# Patient Record
Sex: Female | Born: 1960 | Race: Black or African American | Hispanic: No | Marital: Single | Smoking: Current every day smoker
Health system: Southern US, Community
[De-identification: ages and names within clinical notes are randomized; demographics above are authoritative.]

## PROBLEM LIST (undated history)

## (undated) ENCOUNTER — Emergency Department (HOSPITAL_BASED_OUTPATIENT_CLINIC_OR_DEPARTMENT_OTHER): Admission: EM | Discharge status: Against Medical Advice | Payer: Medicaid Other

## (undated) DIAGNOSIS — G8929 Other chronic pain: Secondary | ICD-10-CM

## (undated) DIAGNOSIS — Q782 Osteopetrosis: Secondary | ICD-10-CM

## (undated) DIAGNOSIS — F250 Schizoaffective disorder, bipolar type: Secondary | ICD-10-CM

## (undated) DIAGNOSIS — F29 Unspecified psychosis not due to a substance or known physiological condition: Secondary | ICD-10-CM

## (undated) DIAGNOSIS — D649 Anemia, unspecified: Secondary | ICD-10-CM

## (undated) DIAGNOSIS — M1990 Unspecified osteoarthritis, unspecified site: Secondary | ICD-10-CM

## (undated) DIAGNOSIS — Z765 Malingerer [conscious simulation]: Secondary | ICD-10-CM

## (undated) HISTORY — PX: TUBAL LIGATION: SHX77

## (undated) HISTORY — PX: MOUTH SURGERY: SHX715

---

## 2013-03-14 ENCOUNTER — Emergency Department (HOSPITAL_COMMUNITY)
Admission: EM | Admit: 2013-03-14 | Discharge: 2013-03-14 | Disposition: A | Discharge status: Home / Self Care | Payer: Medicaid Other | Attending: Emergency Medicine | Admitting: Emergency Medicine

## 2013-03-14 ENCOUNTER — Encounter (HOSPITAL_COMMUNITY): Payer: Self-pay | Admitting: Emergency Medicine

## 2013-03-14 DIAGNOSIS — F919 Conduct disorder, unspecified: Secondary | ICD-10-CM | POA: Insufficient documentation

## 2013-03-14 DIAGNOSIS — M25569 Pain in unspecified knee: Secondary | ICD-10-CM | POA: Insufficient documentation

## 2013-03-14 DIAGNOSIS — F17200 Nicotine dependence, unspecified, uncomplicated: Secondary | ICD-10-CM | POA: Insufficient documentation

## 2013-03-14 DIAGNOSIS — Z3202 Encounter for pregnancy test, result negative: Secondary | ICD-10-CM | POA: Insufficient documentation

## 2013-03-14 DIAGNOSIS — R4587 Impulsiveness: Secondary | ICD-10-CM | POA: Insufficient documentation

## 2013-03-14 DIAGNOSIS — IMO0002 Reserved for concepts with insufficient information to code with codable children: Secondary | ICD-10-CM | POA: Insufficient documentation

## 2013-03-14 LAB — RAPID URINE DRUG SCREEN, HOSP PERFORMED
Amphetamines: NOT DETECTED
Barbiturates: NOT DETECTED
Benzodiazepines: NOT DETECTED
Cocaine: NOT DETECTED
Tetrahydrocannabinol: NOT DETECTED

## 2013-03-14 LAB — POCT PREGNANCY, URINE: Preg Test, Ur: NEGATIVE

## 2013-03-14 MED ORDER — CEFPODOXIME PROXETIL 200 MG PO TABS
400.0000 mg | ORAL_TABLET | Freq: Once | ORAL | Status: AC
  Administered 2013-03-14: 400 mg via ORAL
  Filled 2013-03-14 (×2): qty 2

## 2013-03-14 MED ORDER — AZITHROMYCIN 1 G PO PACK
1.0000 g | PACK | Freq: Once | ORAL | Status: AC
  Administered 2013-03-14: 1 g via ORAL
  Filled 2013-03-14: qty 1

## 2013-03-14 MED ORDER — METRONIDAZOLE 500 MG PO TABS
2000.0000 mg | ORAL_TABLET | Freq: Once | ORAL | Status: AC
  Administered 2013-03-14: 2000 mg via ORAL
  Filled 2013-03-14: qty 4
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
## 2013-03-14 NOTE — ED Notes (Signed)
Notified provider drug screen is resulted provider at bedside.
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
## 2013-03-14 NOTE — ED Notes (Signed)
Nurse tech called lab for results on Urine Drug test. States should be resulted in 10 minutes.

## 2013-03-14 NOTE — ED Notes (Addendum)
Called pharmacy for status on medication to be delivered to [facility].  Will send to [facility] now.
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
## 2013-03-16 ENCOUNTER — Emergency Department (HOSPITAL_COMMUNITY)
Admission: EM | Admit: 2013-03-16 | Discharge: 2013-03-16 | Disposition: A | Discharge status: Home / Self Care | Payer: Medicaid Other | Attending: Emergency Medicine | Admitting: Emergency Medicine

## 2013-03-16 ENCOUNTER — Encounter (HOSPITAL_COMMUNITY): Payer: Self-pay | Admitting: Emergency Medicine

## 2013-03-16 ENCOUNTER — Emergency Department (HOSPITAL_COMMUNITY): Payer: Medicaid Other

## 2013-03-16 DIAGNOSIS — J069 Acute upper respiratory infection, unspecified: Secondary | ICD-10-CM | POA: Insufficient documentation

## 2013-03-16 DIAGNOSIS — IMO0001 Reserved for inherently not codable concepts without codable children: Secondary | ICD-10-CM | POA: Insufficient documentation

## 2013-03-16 DIAGNOSIS — M79609 Pain in unspecified limb: Secondary | ICD-10-CM | POA: Insufficient documentation

## 2013-03-16 DIAGNOSIS — F17200 Nicotine dependence, unspecified, uncomplicated: Secondary | ICD-10-CM | POA: Insufficient documentation

## 2013-03-16 DIAGNOSIS — R52 Pain, unspecified: Secondary | ICD-10-CM | POA: Insufficient documentation

## 2013-03-16 LAB — BASIC METABOLIC PANEL
Calcium: 9.2 mg/dL (ref 8.4–10.5)
Creatinine, Ser: 0.63 mg/dL (ref 0.50–1.10)
GFR calc Af Amer: >90 mL/min (ref >90)
GFR calc non Af Amer: >90 mL/min (ref >90)
Glucose, Bld: 125 mg/dL — ABNORMAL HIGH (ref 70–99)
Sodium: 139 mEq/L (ref 135–145)

## 2013-03-16 LAB — POCT I-STAT TROPONIN I: Troponin i, poc: 0 ng/mL (ref 0.00–0.08)

## 2013-03-16 LAB — CBC
MCH: 32.2 pg (ref 26.0–34.0)
MCHC: 34 g/dL (ref 30.0–36.0)
Platelets: 275 10*3/uL (ref 150–400)
RDW: 13.5 % (ref 11.5–15.5)

## 2013-03-16 MED ORDER — ACETAMINOPHEN 325 MG PO TABS
650.0000 mg | ORAL_TABLET | Freq: Once | ORAL | Status: AC
  Administered 2013-03-16: 650 mg via ORAL
  Filled 2013-03-16: qty 2

## 2013-03-16 NOTE — ED Notes (Signed)
Pt. Left ED when this nurse was in another patient room. Pt. Left both her discharge paperwork and resource guide in bed.

## 2013-03-16 NOTE — ED Notes (Signed)
Pt is here with feeling sob, body aches, legs hurts, and cough.  Pt denies chest pain

## 2013-03-16 NOTE — ED Notes (Signed)
Pt. Given 2 different resource guides to be discharged with, including a homeless shelter reference.

## 2013-03-16 NOTE — ED Provider Notes (Signed)
CSN: [id number]     Arrival date & time 03/16/13  1637 History  This chart was scribed for non-physician practitioner Felicie Morn, NP  working with Celene Kras, MD by Leone Payor, ED Scribe. This patient was seen in room TR10C/TR10C and the patient's care was started at 1637.     Chief Complaint  Patient presents with  . Generalized Body Aches  . Cough    The history is provided by the patient. No language interpreter was used.    HPI Comments: Betty Henderson is a 52 y.o. female who presents to the Emergency Department complaining of 1-2 days of gradual onset, gradually worsening, constant generalized body aches and cough. She also reports having bilateral leg pain that she has at baseline. She states the leg pain is usually worse with increased walking. She is here because she believes she has the flu. She is a daily smoker, last smoke was a few hours ago. She denies any other symptoms.    History reviewed. No pertinent past medical history. Past Surgical History  Procedure Laterality Date  . Mouth surgery     No family history on file. History  Substance Use Topics  . Smoking status: Current Every Day Smoker -- 1.00 packs/day    Types: Cigarettes  . Smokeless tobacco: Not on file  . Alcohol Use: No   OB History   Grav Para Term Preterm Abortions TAB SAB Ect Mult Living                 Review of Systems  HENT: Negative for sore throat.   Respiratory: Positive for cough.   Gastrointestinal: Negative for abdominal pain.  Musculoskeletal: Positive for arthralgias (bilateral leg pains) and myalgias.  All other systems reviewed and are negative.    Allergies  Other  Home Medications  No current outpatient prescriptions on file. BP 121/85  Pulse 91  Temp(Src) 98.6 F (37 C) (Oral)  Resp 22  SpO2 96% Physical Exam  Nursing note and vitals reviewed. Constitutional: She is oriented to person, place, and time. She appears well-developed and well-nourished.  HENT:   Head: Normocephalic and atraumatic.  Right Ear: External ear normal.  Left Ear: External ear normal.  Mouth/Throat: Oropharynx is clear and moist. No oropharyngeal exudate, posterior oropharyngeal edema, posterior oropharyngeal erythema or tonsillar abscesses.  Eyes: Pupils are equal, round, and reactive to light.  Cardiovascular: Normal rate, regular rhythm and normal heart sounds.   Pulmonary/Chest: Effort normal. No respiratory distress. She has no wheezes. She has rhonchi. She has no rales.  Abdominal: Soft. Bowel sounds are normal. She exhibits no distension. There is no tenderness.  Musculoskeletal: She exhibits no tenderness.  Neurological: She is alert and oriented to person, place, and time.  Skin: Skin is warm and dry.  Psychiatric: She has a normal mood and affect.    ED Course  Procedures   DIAGNOSTIC STUDIES: Oxygen Saturation is 96% on RA, adequate by my interpretation.    COORDINATION OF CARE: 6:44 PM Discussed negative lab and imaging results. Discussed treatment plan with pt at bedside and pt agreed to plan.   Labs Review Labs Reviewed  BASIC METABOLIC PANEL - Abnormal; Notable for the following:    Glucose, Bld 125 (*)    All other components within normal limits  CBC  POCT I-STAT TROPONIN I   Imaging Review Dg Chest 2 View  03/16/2013   CLINICAL DATA:  Cough.  EXAM: CHEST  2 VIEW  COMPARISON:  None.  FINDINGS:  The heart size and mediastinal contours are within normal limits. Both lungs are clear. No pleural effusion or pneumothorax is noted. The visualized skeletal structures are unremarkable.  IMPRESSION: No active cardiopulmonary disease.   Electronically Signed   By: Roque Lias M.D.   On: 03/16/2013 18:09    EKG Interpretation   None      Lab and radiology results reviewed, shared with patient.  Local shelter resource information provided.  MDM  Viral URI.   I personally performed the services described in this documentation, which was scribed  in my presence. The recorded information has been reviewed and is accurate.    Jimmye Norman, NP 03/17/13 [phone]
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
## 2013-03-17 NOTE — ED Provider Notes (Signed)
Medical screening examination/treatment/procedure(s) were performed by non-physician practitioner and as supervising physician I was immediately available for consultation/collaboration.    Celene Kras, MD 03/17/13 [phone]

## 2013-03-17 NOTE — ED Provider Notes (Signed)
Medical screening examination/treatment/procedure(s) were performed by non-physician practitioner and as supervising physician I was immediately available for consultation/collaboration.  EKG Interpretation   None        Teon Hudnall R. Aala Ransom, MD 03/17/13 0014 

## 2013-03-21 ENCOUNTER — Encounter (HOSPITAL_COMMUNITY): Payer: Self-pay | Admitting: Emergency Medicine

## 2013-03-21 ENCOUNTER — Emergency Department (HOSPITAL_COMMUNITY)
Admission: EM | Admit: 2013-03-21 | Discharge: 2013-03-21 | Disposition: A | Discharge status: Home / Self Care | Payer: Medicaid Other | Attending: Emergency Medicine | Admitting: Emergency Medicine

## 2013-03-21 DIAGNOSIS — J069 Acute upper respiratory infection, unspecified: Secondary | ICD-10-CM | POA: Insufficient documentation

## 2013-03-21 DIAGNOSIS — R062 Wheezing: Secondary | ICD-10-CM | POA: Insufficient documentation

## 2013-03-21 DIAGNOSIS — F17200 Nicotine dependence, unspecified, uncomplicated: Secondary | ICD-10-CM | POA: Insufficient documentation

## 2013-03-21 DIAGNOSIS — Z5900 Homelessness unspecified: Secondary | ICD-10-CM | POA: Insufficient documentation

## 2013-03-21 MED ORDER — IBUPROFEN 200 MG PO TABS
600.0000 mg | ORAL_TABLET | Freq: Once | ORAL | Status: AC
  Administered 2013-03-21: 600 mg via ORAL
  Filled 2013-03-21 (×2): qty 1

## 2013-03-21 MED ORDER — IBUPROFEN 400 MG PO TABS: 400.0000 mg | ORAL_TABLET | Freq: Four times a day (QID) | ORAL | Status: DC | PRN

## 2013-03-21 MED ORDER — ALBUTEROL SULFATE HFA 108 (90 BASE) MCG/ACT IN AERS
2.0000 | INHALATION_SPRAY | Freq: Four times a day (QID) | RESPIRATORY_TRACT | Status: DC | PRN
  Administered 2013-03-21: 2 via RESPIRATORY_TRACT
  Filled 2013-03-21: qty 6.7

## 2013-03-21 NOTE — Discharge Instructions (Signed)

## 2013-03-21 NOTE — ED Provider Notes (Signed)
Medical screening examination/treatment/procedure(s) were performed by non-physician practitioner and as supervising physician I was immediately available for consultation/collaboration.     Danise Dehne, MD 03/21/13 1451 

## 2013-03-21 NOTE — ED Notes (Signed)
Pt seen multiple times in past for same; pt c/o body aches but denies fever
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
## 2014-09-03 DIAGNOSIS — F200 Paranoid schizophrenia: Secondary | ICD-10-CM | POA: Insufficient documentation

## 2014-11-18 DIAGNOSIS — S93402A Sprain of unspecified ligament of left ankle, initial encounter: Secondary | ICD-10-CM | POA: Insufficient documentation

## 2014-11-18 DIAGNOSIS — S82832A Other fracture of upper and lower end of left fibula, initial encounter for closed fracture: Secondary | ICD-10-CM | POA: Insufficient documentation

## 2015-04-03 ENCOUNTER — Emergency Department (HOSPITAL_BASED_OUTPATIENT_CLINIC_OR_DEPARTMENT_OTHER)
Admission: EM | Admit: 2015-04-03 | Discharge: 2015-04-03 | Disposition: A | Discharge status: Home / Self Care | Payer: Medicaid Other | Attending: Emergency Medicine | Admitting: Emergency Medicine

## 2015-04-03 ENCOUNTER — Encounter (HOSPITAL_BASED_OUTPATIENT_CLINIC_OR_DEPARTMENT_OTHER): Payer: Self-pay | Admitting: *Deleted

## 2015-04-03 DIAGNOSIS — W000XXA Fall on same level due to ice and snow, initial encounter: Secondary | ICD-10-CM | POA: Insufficient documentation

## 2015-04-03 DIAGNOSIS — Y9389 Activity, other specified: Secondary | ICD-10-CM | POA: Diagnosis not present

## 2015-04-03 DIAGNOSIS — F17210 Nicotine dependence, cigarettes, uncomplicated: Secondary | ICD-10-CM | POA: Diagnosis not present

## 2015-04-03 DIAGNOSIS — T148XXA Other injury of unspecified body region, initial encounter: Secondary | ICD-10-CM

## 2015-04-03 DIAGNOSIS — S46911A Strain of unspecified muscle, fascia and tendon at shoulder and upper arm level, right arm, initial encounter: Secondary | ICD-10-CM | POA: Insufficient documentation

## 2015-04-03 DIAGNOSIS — Y998 Other external cause status: Secondary | ICD-10-CM | POA: Insufficient documentation

## 2015-04-03 DIAGNOSIS — Y92009 Unspecified place in unspecified non-institutional (private) residence as the place of occurrence of the external cause: Secondary | ICD-10-CM | POA: Insufficient documentation

## 2015-04-03 DIAGNOSIS — S4991XA Unspecified injury of right shoulder and upper arm, initial encounter: Secondary | ICD-10-CM | POA: Diagnosis present

## 2015-04-03 MED ORDER — IBUPROFEN 800 MG PO TABS
800.0000 mg | ORAL_TABLET | Freq: Once | ORAL | Status: AC
  Administered 2015-04-03: 800 mg via ORAL
  Filled 2015-04-03: qty 1
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
## 2015-04-03 NOTE — ED Notes (Signed)
Pt states she will call her sister for a ride home. Pt assisted to dress and put on her shoes, pt is talking rapidly, cursing.

## 2015-04-03 NOTE — ED Provider Notes (Signed)
CSN: [id number]     Arrival date & time 04/03/15  5284 History   First MD Initiated Contact with Patient 04/03/15 [id number]     Chief Complaint  Patient presents with  . Fall   (Consider location/radiation/quality/duration/timing/severity/associated sxs/prior Treatment) HPI 55 y.o. female with no sig pmh, presents to the Emergency Department today complaining of a fall. Pt was ambulating to get breakfast when she slipped on the ice and fell backwards. She struck her head. No LOC. Bystanders witnessed the fall and notified EMS. Pt is not on any blood thinners. No H/A, changes in vision, N/V/D, chest pain, abdominal pain, numbness/tingling.     History reviewed. No pertinent past medical history. Past Surgical History  Procedure Laterality Date  . Mouth surgery     History reviewed. No pertinent family history. Social History  Substance Use Topics  . Smoking status: Current Every Day Smoker -- 1.00 packs/day    Types: Cigarettes  . Smokeless tobacco: None  . Alcohol Use: No   OB History    No data available     Review of Systems  Constitutional: Negative for fever, chills, diaphoresis and fatigue.  HENT: Negative for congestion, sinus pressure, sore throat and tinnitus.   Eyes: Negative for visual disturbance.  Respiratory: Negative for cough and shortness of breath.   Cardiovascular: Negative for chest pain.  Gastrointestinal: Negative for nausea, vomiting, abdominal pain, diarrhea and constipation.  Endocrine: Negative for cold intolerance and heat intolerance.  Genitourinary: Negative for dysuria and hematuria.  Musculoskeletal: Negative for back pain.  Skin: Negative for color change.  Neurological: Negative for dizziness, syncope, weakness, numbness and headaches.   Allergies  Other  Home Medications   Prior to Admission medications   Medication Sig Start Date End Date Taking? Authorizing Provider  ibuprofen (ADVIL,MOTRIN) 400 MG tablet Take 1 tablet (400 mg total) by  mouth every 6 (six) hours as needed. 03/21/13   Tiffany Neva Seat, PA-C   BP 136/77 mmHg  Pulse 65  Temp(Src) 98.2 F (36.8 C) (Oral)  Resp 18  Ht 5\' 6"  (1.676 m)  Wt 56.7 kg  BMI 20.19 kg/m2  SpO2 100%   Physical Exam  Constitutional: She is oriented to person, place, and time. Vital signs are normal. She appears well-developed and well-nourished.  HENT:  Head: Normocephalic and atraumatic.  Right Ear: Hearing, tympanic membrane, external ear and ear canal normal.  Left Ear: Hearing, tympanic membrane, external ear and ear canal normal.  Nose: Nose normal.  Mouth/Throat: Uvula is midline, oropharynx is clear and moist and mucous membranes are normal.  Eyes: Conjunctivae, EOM and lids are normal. Pupils are equal, round, and reactive to light.  Neck: Trachea normal and normal range of motion. Neck supple.  Cardiovascular: Normal rate, regular rhythm, S1 normal, S2 normal, normal heart sounds, intact distal pulses and normal pulses.   Pulmonary/Chest: Effort normal and breath sounds normal.  Abdominal: Soft. Normal appearance and bowel sounds are normal. There is no tenderness.  Musculoskeletal:       Cervical back: Normal.       Thoracic back: Normal.       Lumbar back: Normal.  Tenderness along R Trapezius   Lymphadenopathy:    She has no cervical adenopathy.  Neurological: She is alert and oriented to person, place, and time. She has normal strength. No cranial nerve deficit or sensory deficit.  Skin: Skin is warm and dry.  Psychiatric: She has a normal mood and affect. Her speech is normal and behavior is  normal. Thought content normal.  Vitals reviewed.  ED Course  Procedures (including critical care time) Labs Review Labs Reviewed - No data to display  Imaging Review No results found. I have personally reviewed and evaluated these images and lab results as part of my medical decision-making.   EKG Interpretation None     MDM  I have reviewed the relevant previous  healthcare records. I have reviewed EMS Documentation. I obtained HPI from historian. Patient discussed with supervising physician  ED Course: C-Spine was cleared clinically: -She is alert and oriented with GCS of 15 -She is not intoxicated -She does not have a significant distracting injury -She is not high risk (age 58>65 y.o. Or dangerous mechanism or paresthesias in extremities).  -Full range of motion of extremities is sage to assess due to low risk. -She does not have midline cervical spine tenderness -The patient is able to actively rotate their neck 45 degrees left and right. No acute focal neurological deficit is present.   Patient has no of the following indications for head CT based on [place] CT head rule:  -GCS<15 two hours after injury -Suspected open or depressed skull fracture -Signs of basilar skull fracture (raccoon eyes, Battle's sign, otorrhea/rhinorrhea c/w CSF leak) -2+ episodes of vomiting -Age > 1865  -Amnesia before impact of > 30 minutes -Severe mechanism.   Assessment: [age]y F with no sig ph presents with a fall. Based on exam, I do not suspect any type of intercranial abnormalities. CN intact, no sensory/motor deficits. Cspine was cleared based on Nexus criteria. Pain is not on spinous process process and is more so on R trapezius. I will treat with ibuprofen and dc patient home with strict return precautions. Have her follow up with PCP for symptom management.     Patient is in no acute distress. Vital Signs are stable. Patient is able to ambulate.  Disposition/Plan:  DC Home Additional Verbal discharge instructions given and discussed with patient.  Pt Instructed to f/u with PCP in the next 48 hours for evaluation and treatment of symptoms. Return precautions given Pt acknowledges and agrees with plan   Supervising Physician Azalia BilisKevin Campos, MD   Final diagnoses:  Muscle strain     Audry Piliyler Angellee Cohill, PA-C 04/03/15 0804  Audry Piliyler Achaia Garlock, PA-C 04/03/15  [id number]  Azalia BilisKevin Campos, MD 04/03/15 [phone]

## 2015-04-03 NOTE — ED Notes (Signed)
MD at bedside. 

## 2015-04-19 ENCOUNTER — Emergency Department (HOSPITAL_BASED_OUTPATIENT_CLINIC_OR_DEPARTMENT_OTHER)
Admission: EM | Admit: 2015-04-19 | Discharge: 2015-04-19 | Discharge status: Against Medical Advice | Payer: Medicaid Other | Attending: Emergency Medicine | Admitting: Emergency Medicine

## 2015-04-19 DIAGNOSIS — M542 Cervicalgia: Secondary | ICD-10-CM | POA: Diagnosis present

## 2015-04-19 DIAGNOSIS — F17210 Nicotine dependence, cigarettes, uncomplicated: Secondary | ICD-10-CM | POA: Diagnosis not present

## 2015-04-19 DIAGNOSIS — M79606 Pain in leg, unspecified: Secondary | ICD-10-CM | POA: Diagnosis not present

## 2015-04-19 NOTE — ED Notes (Signed)
Per EMS pt having neck and leg pain for 3 years.

## 2015-04-19 NOTE — ED Notes (Signed)
Pt yelling at staff and EMS, pt states she is leaving. Upon asking past hx and why she is here.

## 2015-11-02 ENCOUNTER — Encounter (HOSPITAL_COMMUNITY): Payer: Self-pay | Admitting: *Deleted

## 2015-11-02 ENCOUNTER — Emergency Department (HOSPITAL_COMMUNITY)
Admission: EM | Admit: 2015-11-02 | Discharge: 2015-11-02 | Disposition: A | Discharge status: Home / Self Care | Payer: Medicaid Other | Attending: Emergency Medicine | Admitting: Emergency Medicine

## 2015-11-02 DIAGNOSIS — M79604 Pain in right leg: Secondary | ICD-10-CM | POA: Insufficient documentation

## 2015-11-02 DIAGNOSIS — F17210 Nicotine dependence, cigarettes, uncomplicated: Secondary | ICD-10-CM | POA: Insufficient documentation

## 2015-11-02 DIAGNOSIS — M79605 Pain in left leg: Secondary | ICD-10-CM | POA: Insufficient documentation

## 2015-11-02 MED ORDER — ACETAMINOPHEN 325 MG PO TABS
650.0000 mg | ORAL_TABLET | Freq: Once | ORAL | Status: AC
  Administered 2015-11-02: 650 mg via ORAL
  Filled 2015-11-02: qty 2

## 2015-11-02 MED ORDER — IBUPROFEN 200 MG PO TABS
400.0000 mg | ORAL_TABLET | Freq: Once | ORAL | Status: AC
  Administered 2015-11-02: 400 mg via ORAL
  Filled 2015-11-02: qty 2

## 2015-11-02 NOTE — ED Triage Notes (Signed)
Pt arrives to the ER via EMS for complaints of leg / thigh pain x 5 years; pt states that she has been seen multiple times at multiple different hospitals and states "I don't ever get anything but a pill" pt states that she was advised that she needed a brace for her legs; pt states that she walks a lot and her legs "give out" at times; pt ambulatory to triage from the EMS bay and ambulatory to triage 8

## 2015-11-02 NOTE — ED Provider Notes (Signed)
[facility]-EMERGENCY DEPT Provider Note   CSN: [id number] Arrival date & time: 11/02/15  0222  First Provider Contact:  None       History   Chief Complaint Chief Complaint  Patient presents with  . Leg Pain    HPI Betty Henderson is a 55 y.o. female.  HPI   Here today for leg problems, reports bilateral leg pain for 4-5 years.  Has been in hospital multiple times and give motrin and tylenol and sent home and reports that helps.  Denies recent trauma.  Taking some goody powders which help.  No fever.  Pain is moderate. Located bilateral thighs. No abd or back pain. Pt poor historian, unclear difference today that brought pt to ED, however she has had multiple presentations in care everywhere similar.    History reviewed. No pertinent past medical history.  There are no active problems to display for this patient.   Past Surgical History:  Procedure Laterality Date  . MOUTH SURGERY    . TUBAL LIGATION      OB History    No data available       Home Medications    Prior to Admission medications   Medication Sig Start Date End Date Taking? Authorizing Provider  ibuprofen (ADVIL,MOTRIN) 400 MG tablet Take 1 tablet (400 mg total) by mouth every 6 (six) hours as needed. 03/21/13   Marlon Peliffany Greene, PA-C    Family History No family history on file.  Social History Social History  Substance Use Topics  . Smoking status: Current Every Day Smoker    Packs/day: 1.00    Types: Cigarettes  . Smokeless tobacco: Never Used  . Alcohol use No     Allergies   Other   Review of Systems Review of Systems  Constitutional: Negative for fever.  HENT: Negative for sore throat.   Eyes: Negative for visual disturbance.  Respiratory: Negative for cough and shortness of breath.   Cardiovascular: Negative for chest pain.  Gastrointestinal: Negative for abdominal pain.  Genitourinary: Negative for difficulty urinating and vaginal bleeding (resolved yesterday but did have it  (post menopause)).  Musculoskeletal: Positive for arthralgias. Negative for back pain and neck pain.  Skin: Negative for rash.  Neurological: Negative for syncope and headaches.     Physical Exam Updated Vital Signs BP 101/62   Pulse (!) 55   Temp 97.5 F (36.4 C) (Oral)   Resp 17   SpO2 100%   Physical Exam  Constitutional: She is oriented to person, place, and time. She appears well-developed and well-nourished. No distress.  HENT:  Head: Normocephalic and atraumatic.  Appears disheveled  Eyes: Conjunctivae and EOM are normal.  Neck: Normal range of motion.  Cardiovascular: Normal rate, regular rhythm, normal heart sounds and intact distal pulses.  Exam reveals no gallop and no friction rub.   No murmur heard. Pulmonary/Chest: Effort normal and breath sounds normal. No respiratory distress. She has no wheezes. She has no rales.  Abdominal: Soft. She exhibits no distension. There is no tenderness. There is no guarding.  Musculoskeletal: She exhibits no edema or tenderness (reports bilateral thigh tenderness).  Neurological: She is alert and oriented to person, place, and time.  Skin: Skin is warm and dry. No rash noted. She is not diaphoretic. No erythema.  Nursing note and vitals reviewed.    ED Treatments / Results  Labs (all labs ordered are listed, but only abnormal results are displayed) Labs Reviewed - No data to display  EKG  EKG  Interpretation None       Radiology No results found.  Procedures Procedures (including critical care time)  Medications Ordered in ED Medications  acetaminophen (TYLENOL) tablet 650 mg (650 mg Oral Given 11/02/15 0513)  ibuprofen (ADVIL,MOTRIN) tablet 400 mg (400 mg Oral Given 11/02/15 0513)     Initial Impression / Assessment and Plan / ED Course  I have reviewed the triage vital signs and the nursing notes.  Pertinent labs & imaging results that were available during my care of the patient were reviewed by me and  considered in my medical decision making (see chart for details).  Clinical Course    55yo female presents with concern for bilateral leg pain that she reports has been present for 5 years.  No sign of swelling, no erythema, no hx of trauma.normal ROM, no asymmetric pain, good bilateral pulses and doubt acute DVT, arterial occlusion, cellulitis, abscess, septic joint, fx or ligamentous injury. Pt with multiple similar prior presentations reviewed in care everywhere. No hx of hypokalemia. Given ibuprofen/tylenol and sandwich and recommend PCP follow up. Pt also reports post-menopausal bleeding recently (not current) and recommend OBGYN follow up.Patient discharged in stable condition with understanding of reasons to return.   Final Clinical Impressions(s) / ED Diagnoses   Final diagnoses:  Bilateral leg pain    New Prescriptions Discharge Medication List as of 11/02/2015  5:03 AM       Alvira Monday, MD 11/02/15 [phone]

## 2016-01-29 ENCOUNTER — Emergency Department (HOSPITAL_COMMUNITY)
Admission: EM | Admit: 2016-01-29 | Discharge: 2016-01-30 | Disposition: A | Discharge status: Home / Self Care | Payer: Medicaid Other | Attending: Emergency Medicine | Admitting: Emergency Medicine

## 2016-01-29 ENCOUNTER — Encounter (HOSPITAL_COMMUNITY): Payer: Self-pay | Admitting: Family Medicine

## 2016-01-29 DIAGNOSIS — F17210 Nicotine dependence, cigarettes, uncomplicated: Secondary | ICD-10-CM | POA: Insufficient documentation

## 2016-01-29 DIAGNOSIS — Y999 Unspecified external cause status: Secondary | ICD-10-CM | POA: Insufficient documentation

## 2016-01-29 DIAGNOSIS — M545 Low back pain: Secondary | ICD-10-CM

## 2016-01-29 DIAGNOSIS — Y929 Unspecified place or not applicable: Secondary | ICD-10-CM | POA: Insufficient documentation

## 2016-01-29 DIAGNOSIS — Y939 Activity, unspecified: Secondary | ICD-10-CM | POA: Insufficient documentation

## 2016-01-29 DIAGNOSIS — W1849XA Other slipping, tripping and stumbling without falling, initial encounter: Secondary | ICD-10-CM | POA: Insufficient documentation

## 2016-01-29 NOTE — ED Triage Notes (Signed)
Pt called from triage,. No answer

## 2016-01-29 NOTE — ED Triage Notes (Signed)
Patient reports she was throwing a fit while in hand cuffs earlier today and slipped. Pt is complaining of lower back pain. Denies numbness, tinglings, or loss of bowel/bladder.

## 2016-01-30 MED ORDER — IBUPROFEN 200 MG PO TABS: 600.0000 mg | ORAL_TABLET | Freq: Once | ORAL | Status: DC

## 2016-01-30 MED ORDER — CYCLOBENZAPRINE HCL 10 MG PO TABS: 5.0000 mg | ORAL_TABLET | Freq: Once | ORAL | Status: DC

## 2016-01-30 NOTE — ED Provider Notes (Signed)
[facility]-EMERGENCY DEPT Provider Note   CSN: [id number] Arrival date & time: 01/29/16  1954  By signing my name below, I, Betty Henderson, attest that this documentation has been prepared under the direction and in the presence of non-physician practitioner, Arvilla MeresAshley Mina Babula, PA-C  Electronically Signed: Levon HedgerElizabeth Henderson, Scribe. 01/30/2016. 12:49 AM.   History   Chief Complaint Chief Complaint  Patient presents with  . Back Pain   HPI Betty Henderson is a 55 y.o. female who presents to the Emergency Department complaining of sudden onset, improving low back pain onset tonight. She states she was "throwing a fit" while in handcuffs earlier and slipped. Pt rates the pain a 5/10 in severity. Pain is exacerbated by walking and is alleviated by laying down. No treatments tried PTA. Pt is able to ambulate, but states it is painful. No hx of cancer, IV drug use, chronic steroid use, or back surgeries. She notes associated neck pain, but denies any incontinence, abdominal pain, nausea, vomiting, fever, hematuria, dysuria, numbness or weakness.   The history is provided by the patient. No language interpreter was used.    History reviewed. No pertinent past medical history.  There are no active problems to display for this patient.   Past Surgical History:  Procedure Laterality Date  . MOUTH SURGERY    . TUBAL LIGATION      OB History    No data available       Home Medications    Prior to Admission medications   Medication Sig Start Date End Date Taking? Authorizing Provider  ibuprofen (ADVIL,MOTRIN) 400 MG tablet Take 1 tablet (400 mg total) by mouth every 6 (six) hours as needed. 03/21/13   Marlon Peliffany Greene, PA-C    Family History History reviewed. No pertinent family history.  Social History Social History  Substance Use Topics  . Smoking status: Current Every Day Smoker    Packs/day: 1.00    Types: Cigarettes  . Smokeless tobacco: Never Used  . Alcohol use No    Allergies     Other  Review of Systems Review of Systems  Constitutional: Negative for fever.  Gastrointestinal: Negative for abdominal pain, nausea and vomiting.  Genitourinary: Negative for dysuria and hematuria.  Musculoskeletal: Positive for back pain and neck pain.  Skin: Negative for rash.  Neurological: Negative for weakness and numbness.   Physical Exam Updated Vital Signs BP 106/61 (BP Location: Left Arm)   Pulse 71   Temp 98.1 F (36.7 C) (Oral)   Resp 18   Wt 54.1 kg   SpO2 98%   BMI 19.26 kg/m   Physical Exam  Constitutional: She appears well-developed and well-nourished. No distress.  HENT:  Head: Normocephalic and atraumatic.  Mouth/Throat: Oropharynx is clear and moist. No oropharyngeal exudate.  Eyes: Conjunctivae and EOM are normal. Pupils are equal, round, and reactive to light. Right eye exhibits no discharge. Left eye exhibits no discharge. No scleral icterus.  Neck: Normal range of motion and phonation normal. Neck supple. No neck rigidity. Normal range of motion present.  Mild TTP of cervical spine. No obvious deformities or step off. Neck ROM intact.   Cardiovascular: Normal rate, regular rhythm, normal heart sounds and intact distal pulses.   No murmur heard. Pulmonary/Chest: Effort normal and breath sounds normal. No stridor. No respiratory distress. She has no wheezes. She has no rales.  Abdominal: Soft. Bowel sounds are normal. She exhibits no distension. There is no tenderness. There is no rigidity, no rebound, no guarding and no  CVA tenderness.  Musculoskeletal: Normal range of motion.  No TTP of lumbar spine or lumbar paravertebral muscles. No obvious deformities or step off.   Neurological: She is alert. She is not disoriented. Coordination and gait normal. GCS eye subscore is 4. GCS verbal subscore is 5. GCS motor subscore is 6.  Mental Status:  Alert, thought content appropriate, able to give a coherent history. Speech fluent without evidence of aphasia.  Able to follow 2 step commands without difficulty.  Cranial Nerves:  II:  Peripheral visual fields grossly normal, pupils equal, round, reactive to light III,IV, VI: ptosis not present, extra-ocular motions intact bilaterally  V,VII: smile symmetric, facial light touch sensation equal VIII: hearing grossly normal to voice  X: uvula elevates symmetrically  XI: bilateral shoulder shrug symmetric and strong XII: midline tongue extension without fassiculations Motor:  Normal tone. 5/5 in upper and lower extremities bilaterally including strong and equal grip strength and dorsiflexion/plantar flexion Sensory: light touch normal in all extremities. DTRs: patellar 2+ symmetric b/l Cerebellar: normal finger-to-nose with bilateral upper extremities Gait: normal gait and balance CV: distal pulses palpable throughout   Skin: Skin is warm and dry. She is not diaphoretic.  Psychiatric: She has a normal mood and affect. Her behavior is normal.   ED Treatments / Results  DIAGNOSTIC STUDIES:  Oxygen Saturation is 98% on RA, normal by my interpretation.    COORDINATION OF CARE:  12:48 AM Discussed treatment plan with pt at bedside and pt agreed to plan.  Labs (all labs ordered are listed, but only abnormal results are displayed) Labs Reviewed - No data to display  EKG  EKG Interpretation None       Radiology No results found.  Procedures Procedures (including critical care time)  Medications Ordered in ED Medications  ibuprofen (ADVIL,MOTRIN) tablet 600 mg (not administered)  cyclobenzaprine (FLEXERIL) tablet 5 mg (not administered)     Initial Impression / Assessment and Plan / ED Course  I have reviewed the triage vital signs and the nursing notes.  Pertinent labs & imaging results that were available during my care of the patient were reviewed by me and considered in my medical decision making (see chart for details).  Clinical Course     Patient presents to ED with  complaint of low back pain onset tonight. Patient is afebrile and non-toxic appearing in NAD. VSS. Patient with back pain. TTP of cervical spine without obvious deformity, neck ROM intact. No midline lumbar spinal tenderness or TTP of lumbar paravertebral muscles. No neurological deficits and normal neuro exam.  Patient can walk but states is painful.  No loss of bowel or bladder control.  No concern for cauda equina.  No fever, night sweats, weight loss, h/o cancer, IVDU. Given age will x-ray neck and lumbar spine to r/o fracture or sublucation. Pain medication ordered.  1:30am: Patient has eloped prior to imaging and medication administration.    Final Clinical Impressions(s) / ED Diagnoses   Final diagnoses:  Acute low back pain, unspecified back pain laterality, with sciatica presence unspecified    New Prescriptions Discharge Medication List as of 01/30/2016  1:24 AM    I personally performed the services described in this documentation, which was scribed in my presence. The recorded information has been reviewed and is accurate.    Lona KettleAshley Laurel Domique Reardon, [place]PA-C 01/30/16 [id number]    Zadie Rhineonald Wickline, MD 01/30/16 [phone]

## 2016-01-30 NOTE — ED Notes (Signed)
Second attempt made to speak with Betty Henderson related to illness and need for medical care she continues to refuse conversation and remained mute as she walked out of the ED

## 2016-04-18 ENCOUNTER — Encounter (HOSPITAL_COMMUNITY): Payer: Self-pay

## 2016-04-18 ENCOUNTER — Emergency Department (HOSPITAL_COMMUNITY): Payer: Medicaid Other

## 2016-04-18 ENCOUNTER — Emergency Department (HOSPITAL_COMMUNITY)
Admission: EM | Admit: 2016-04-18 | Discharge: 2016-04-19 | Disposition: A | Discharge status: Other Acute Inpatient Hospital | Payer: Medicaid Other | Attending: Emergency Medicine | Admitting: Emergency Medicine

## 2016-04-18 DIAGNOSIS — F17210 Nicotine dependence, cigarettes, uncomplicated: Secondary | ICD-10-CM | POA: Insufficient documentation

## 2016-04-18 DIAGNOSIS — F23 Brief psychotic disorder: Secondary | ICD-10-CM | POA: Insufficient documentation

## 2016-04-18 DIAGNOSIS — Z79899 Other long term (current) drug therapy: Secondary | ICD-10-CM | POA: Insufficient documentation

## 2016-04-18 LAB — COMPREHENSIVE METABOLIC PANEL
ALT: 12 U/L — AB (ref 14–54)
AST: 20 U/L (ref 15–41)
Albumin: 4.6 g/dL (ref 3.5–5.0)
Alkaline Phosphatase: 58 U/L (ref 38–126)
Anion gap: 12 (ref 5–15)
BUN: 12 mg/dL (ref 6–20)
CHLORIDE: 103 mmol/L (ref 101–111)
CO2: 24 mmol/L (ref 22–32)
CREATININE: 0.8 mg/dL (ref 0.44–1.00)
Calcium: 10.1 mg/dL (ref 8.9–10.3)
GFR calc Af Amer: >60 mL/min (ref >60)
GFR calc non Af Amer: >60 mL/min (ref >60)
Glucose, Bld: 114 mg/dL — ABNORMAL HIGH (ref 65–99)
Potassium: 4.1 mmol/L (ref 3.5–5.1)
SODIUM: 139 mmol/L (ref 135–145)
Total Bilirubin: 1 mg/dL (ref 0.3–1.2)
Total Protein: 7.4 g/dL (ref 6.5–8.1)

## 2016-04-18 LAB — CBC
HEMATOCRIT: 35.9 % — AB (ref 36.0–46.0)
Hemoglobin: 11.7 g/dL — ABNORMAL LOW (ref 12.0–15.0)
MCH: 29.9 pg (ref 26.0–34.0)
MCHC: 32.6 g/dL (ref 30.0–36.0)
MCV: 91.8 fL (ref 78.0–100.0)
Platelets: 234 10*3/uL (ref 150–400)
RBC: 3.91 MIL/uL (ref 3.87–5.11)
RDW: 12.8 % (ref 11.5–15.5)
WBC: 9 10*3/uL (ref 4.0–10.5)

## 2016-04-18 LAB — ETHANOL: Alcohol, Ethyl (B): <5 mg/dL (ref <5)

## 2016-04-18 MED ORDER — STERILE WATER FOR INJECTION IJ SOLN
INTRAMUSCULAR | Status: AC
  Filled 2016-04-18: qty 10

## 2016-04-18 MED ORDER — LORAZEPAM 1 MG PO TABS: 1.0000 mg | ORAL_TABLET | Freq: Four times a day (QID) | ORAL | Status: DC | PRN

## 2016-04-18 MED ORDER — ZIPRASIDONE MESYLATE 20 MG IM SOLR
10.0000 mg | Freq: Once | INTRAMUSCULAR | Status: AC
  Administered 2016-04-18: 10 mg via INTRAMUSCULAR
  Filled 2016-04-18: qty 20

## 2016-04-18 NOTE — ED Notes (Signed)
TTS called to advised she is unable to assess pt; RN at at room to check on pt; Pt has gotten dressed and states she would like to leave; RN ask pt to stay in room and she will bring paper work to sign out; RN walks to [employer] stations but still has pt in sight as pt is standing in hall way; pt exit the room going opposite way; RN tries to stop pt by asking if she can wait til papers are brought to the room pt states again she would like to leave; RN watched pt leave exit as there was no paper work in place at this time to hold pt; RN notified Md;

## 2016-04-18 NOTE — ED Notes (Signed)
When pt asked why she was being seen in ED, she stated "bleeding ulcers."  Reports she had someone bring her to [place] [facility] than [facility].  During our conversation, pt told me she was [place] in [place] [employer].  States she just got out of "the box" in [place].  Tells me her father was a Psychologist, occupationalsecret agent and that she, herself was in the [employer] and [employer].  States she is getting a job at [employer].  She likes [place] better than [employer] or [employer].  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

## 2016-04-18 NOTE — ED Provider Notes (Signed)
TIME SEEN: 4:35 AM  CHIEF COMPLAINT: "Spitting up blood"  HPI: Patient is a 56 year old female with unknown past medical history who presents to the emergency department stating that she is "spitting up blood" for the past several days. She denies that she is coughing it up or vomiting. Denies bloody stools or melena. Denies being on antiplatelets or anticoagulants. States she was having upper abdominal pain that has now resolved. Denies history of endoscopy or colonoscopy. Patient states that she thinks she has had ulcers before. When asked why she states "I just worked myself up as a teenager". Has never seen a gastroenterologist. Denies alcohol or NSAID use. Denies any pain currently. States she has not had an episode of spitting up blood and over 24 hours.   On evaluation, patient appears to be arguing with someone who is not present in the room. She is yelling and cursing. Told nursing staff that "I am the [place] of the universe". Has also told me that she is in the witness protection program.  ROS: Level V caveat for psychosis  PAST MEDICAL HISTORY/PAST SURGICAL HISTORY:  History reviewed. No pertinent past medical history.  MEDICATIONS:  Prior to Admission medications   Medication Sig Start Date End Date Taking? Authorizing Provider  ibuprofen (ADVIL,MOTRIN) 400 MG tablet Take 1 tablet (400 mg total) by mouth every 6 (six) hours as needed. 03/21/13   Marlon Pel, PA-C    ALLERGIES:  Allergies  Allergen Reactions  . Other     PT reports having environmental allergies which she used to receive shots for, but cannot specify exact allergens    SOCIAL HISTORY:  Social History  Substance Use Topics  . Smoking status: Current Every Day Smoker    Packs/day: 1.00    Types: Cigarettes  . Smokeless tobacco: Never Used  . Alcohol use No    FAMILY HISTORY: History reviewed. No pertinent family history.  EXAM: BP 134/87 (BP Location: Right Arm)   Pulse 64   Temp 97.8 F (36.6  C) (Oral)   Resp 16   SpO2 100%  CONSTITUTIONAL: Alert and oriented But does not respond appropriately to questions. Appears very agitated. Chronically ill-appearing but in no distress. HEAD: Normocephalic EYES: Conjunctivae clear, PERRL, EOMI ENT: normal nose; no rhinorrhea; moist mucous membranes, poor dentition, no bleeding from her gums, no blood in her mouth NECK: Supple, no meningismus, no nuchal rigidity, no LAD  CARD: RRR; S1 and S2 appreciated; no murmurs, no clicks, no rubs, no gallops RESP: Normal chest excursion without splinting or tachypnea; breath sounds clear and equal bilaterally; no wheezes, no rhonchi, no rales, no hypoxia or respiratory distress, speaking full sentences ABD/GI: Normal bowel sounds; non-distended; soft, non-tender, no rebound, no guarding, no peritoneal signs, no hepatosplenomegaly BACK:  The back appears normal and is non-tender to palpation, there is no CVA tenderness EXT: Normal ROM in all joints; non-tender to palpation; no edema; normal capillary refill; no cyanosis, no calf tenderness or swelling    SKIN: Normal color for age and race; warm; no rash NEURO: Moves all extremities equally PSYCH:  Patient denies SI or HI. She appears actively psychotic. Are you with someone in the room who is not present.  MEDICAL DECISION MAKING: Patient here with complaints of spitting up blood. On evaluation of patient's previous notes it appears she has been a [facility] for the same with negative workup. She denies that she is coughing or vomiting and states her last episode was over 24 hours ago. Exam  is completely benign other than patient appearing very agitated and obviously hallucinating. When I ask her if she is ever been admitted to a psychiatric facility before she states "I've been at [facility] long time [facility]". She denies that she is on any psychiatric medications. States she takes no medications at all. When I ask her if she feel she would benefit from  talking to psychiatry team she agrees and states she will stay voluntarily.  Will consult TTS.  ED PROGRESS:   5:20 AM  Pt had agreed to stay voluntarily. I felt she was acutely psychotic and that is why placed her on a psychiatric hold and I had immediately asked for nurse to get a sitter after I left the room and that patient could not leave but given she was voluntary at this time she did not need IVC. I was just informed by nursing staff the patient walked out of the emergency department. I was not told that she was leaving until she was already gone from the department. We will attempt to have police search for patient as I'm concerned she is psychotic and needs psychiatric evaluation and possible admission.  6:00 AM  Security was able to find patient at the bus stop and bring her back to the emergency room. IVC paperwork has been completed. TTS has been consulted. At this time she is medically stable and awaiting their evaluation.   I reviewed all nursing notes, vitals, pertinent old records, EKGs, labs, imaging (as available).    Layla MawKristen N Ward, DO 04/18/16 [phone]

## 2016-04-18 NOTE — ED Triage Notes (Signed)
Pt here for " coughing up blood " and sts may have " busted in her stomach " pt reports abd pain. Pt speaks of being a [place] of over the universe, sts she was in protective witness  Box For three months and just got out, obvious flight of ideas noted.

## 2016-04-18 NOTE — ED Notes (Signed)
Breakfast tray ordered 

## 2016-04-18 NOTE — ED Notes (Signed)
During TTS pt became agitated and left. MD was notified after pt had left by primary RN. This writer walked to bus stop with [employer] and found pt. 24 hour hold was filled by MD and IVC paper work as well. Between myself and [employer] pt was spoken to and aggreed to come back inside.

## 2016-04-18 NOTE — BH Assessment (Signed)
Under Review: [place], [address], [place], [address], [address] [address], [place], [facility], [place], [facility], [facility], [place]

## 2016-04-18 NOTE — BH Assessment (Addendum)
Tele Assessment Note   Betty Henderson is an 56 y.o. female, who per ED RN, Claybon Jabs, "Pt here for " coughing up blood " and sts may have " busted in her stomach " pt reports abd pain. Pt speaks of being a [place] of over the universe, sts she was in protective witness box for three months and just got out, obvious flight of ideas noted". Pt was originally unabale to be seen for assessement due to mental status and agitation. During this assessment, pt was cooperative, but an unreliable historian due to mental status. She thought it was New year's day due to someone telling her "Happy new year" earlier. Pt denies SI, HI, AVH. Pt is unable to give past history, but does mention getting treatment in the past [facility], but says something about not being able to fill her prescriptions.  Pt says she lives in an apartment.  Pt has poor insight and judgment. Legal history is unknown, but she mentions something about being upset that she could not get her money at the police station. ? Pt denies alcohol/ substance abuse. ? MSE: Pt is casually dressed, alert, oriented to person, place with pressured speech and restless motor behavior. Eye contact is fair. Pt's mood is irritable. Affect is congruent with mood. Thought process is tangential. There is evidence that pt is currently responding to internal stimuli and experiencing delusional thought content. Pt was cooperative throughout assessment.    Elta Guadeloupe, NP, recommends inpatient psychiatric treatment.  [facility] has no appropriate beds. TTS to seek placement.        Diagnosis: Psychotic Disorder NOS  Past Medical History: History reviewed. No pertinent past medical history.  Past Surgical History:  Procedure Laterality Date  . MOUTH SURGERY    . TUBAL LIGATION      Family History: History reviewed. No pertinent family history.  Social History:  reports that she has been smoking Cigarettes.  She has been smoking about 1.00  pack per day. She has never used smokeless tobacco. She reports that she does not drink alcohol or use drugs.  Additional Social History:  Alcohol / Drug Use Pain Medications: UTA due to mental status Prescriptions: UTA due to mental status Over the Counter: UTA due to mental status History of alcohol / drug use?:  (UTA due to mental status) Longest period of sobriety (when/how long): UTA due to mental status Withdrawal Symptoms:  (UTA due to mental status)  CIWA: CIWA-Ar BP: (!) 114/48 Pulse Rate: (!) 58 COWS:    PATIENT STRENGTHS: (choose at least two) Capable of independent living Communication skills  Allergies:  Allergies  Allergen Reactions  . Other     PT reports having environmental allergies which she used to receive shots for, but cannot specify exact allergens    Home Medications:  (Not in a hospital admission)  OB/GYN Status:  No LMP recorded. Patient is postmenopausal.  General Assessment Data Location of Assessment: [facility] ED TTS Assessment: In system Is this a Tele or Face-to-Face Assessment?: Tele Assessment Is this an Initial Assessment or a Re-assessment for this encounter?: Initial Assessment Marital status:  (UTA due to mental status) Is patient pregnant?: No Pregnancy Status: No Living Arrangements: Other (Comment) (UTA due to mental status, but said she had an apartment) Can pt return to current living arrangement?: Yes Admission Status: Involuntary Is patient capable of signing voluntary admission?: Yes Referral Source: Self/Family/Friend Insurance type: MCD     Crisis Care Plan Living Arrangements: Other (  Comment) (UTA due to mental status, but said she had an apartment) Name of Psychiatrist: UTA due to mental status Name of Therapist: UTA due to mental status  Education Status Is patient currently in school?: No  Risk to self with the past 6 months Suicidal Ideation: No Has patient been a risk to self within the past 6 months prior to  admission? : No (UTA due to mental status) Suicidal Intent: No Has patient had any suicidal intent within the past 6 months prior to admission? :  (UTA due to mental status) Is patient at risk for suicide?:  (UTA due to mental status) Suicidal Plan?:  (UTA due to mental status) Has patient had any suicidal plan within the past 6 months prior to admission? :  (UTA due to mental status) Access to Means:  (UTA due to mental status) What has been your use of drugs/alcohol within the last 12 months?:  (UTA due to mental status) Previous Attempts/Gestures:  (UTA due to mental status) How many times?:  (UTA due to mental status) Other Self Harm Risks:  (UTA due to mental status) Triggers for Past Attempts:  (UTA due to mental status) Intentional Self Injurious Behavior:  (UTA due to mental status) Family Suicide History: Unable to assess (UTA due to mental status) Recent stressful life event(s): Legal Issues (UTA due to mental status, metntioned something about the pol) Persecutory voices/beliefs?: Yes Depression: No Depression Symptoms: Feeling angry/irritable Substance abuse history and/or treatment for substance abuse?:  (UTA due to mental status) Suicide prevention information given to non-admitted patients: Not applicable  Risk to Others within the past 6 months Homicidal Ideation: No Does patient have any lifetime risk of violence toward others beyond the six months prior to admission? : Unknown (mentioned being angry about not getting her money) Thoughts of Harm to Others:  (UTA due to mental status) Current Homicidal Intent:  (UTA due to mental status) Current Homicidal Plan:  (UTA due to mental status) Access to Homicidal Means:  (UTA due to mental status) History of harm to others?:  (UTA due to mental status) Assessment of Violence: None Noted Violent Behavior Description:  (uncooperative) Does patient have access to weapons?:  (UTA due to mental status) Criminal Charges Pending?:   (UTA due to mental status) Does patient have a court date:  (UTA due to mental status, mentioned the police) Is patient on probation?: Unknown  Psychosis Hallucinations:  (denies, but symptoms were observed) Delusions: Persecutory, Grandiose  Mental Status Report Appearance/Hygiene: In scrubs, Disheveled Eye Contact: Fair Motor Activity: Restlessness Speech: Argumentative, Pressured, Rapid, Incoherent, Loud, Tangential Level of Consciousness: Alert Mood: Irritable, Angry, Suspicious Affect: Apprehensive, Irritable Anxiety Level: Moderate Thought Processes: Tangential, Flight of Ideas Judgement: Impaired Orientation: Person, Place, Situation Obsessive Compulsive Thoughts/Behaviors: Minimal  Cognitive Functioning Concentration: Fair Memory: Recent Impaired, Remote Impaired IQ: Average Insight: Poor Impulse Control: Poor Appetite: Fair Weight Loss: 0 Weight Gain: 0 Sleep: Unable to Assess Total Hours of Sleep:  (UTA due to mental status) Vegetative Symptoms: Unable to Assess  ADLScreening [facility] Assessment Services) Patient's cognitive ability adequate to safely complete daily activities?: Yes Patient able to express need for assistance with ADLs?: Yes Independently performs ADLs?: Yes (appropriate for developmental age)  Prior Inpatient Therapy Prior Inpatient Therapy:  (UTA due to mental status, [facility];) Prior Therapy Facilty/Provider(s):  ([facility]?) Reason for Treatment:  (UTA due to mental status)  Prior Outpatient Therapy Prior Outpatient Therapy:  (UTA due to mental status-mentioned [facility]) Prior  Therapy Dates:  (UTA due to mental status) Prior Therapy Facilty/Provider(s):  (UTA due to mental status) Reason for Treatment:  (UTA due to mental status) Does patient have an ACCT team?: Unknown (UTA due to mental status) Does patient have Intensive In-House Services?  : Unknown (UTA due to mental status) Does patient have [facility]  services? : Unknown (UTA due to mental status) Does patient have P4CC services?: Unknown (UTA due to mental status)  ADL Screening (condition at time of admission) Patient's cognitive ability adequate to safely complete daily activities?: Yes Is the patient deaf or have difficulty hearing?: No Does the patient have difficulty seeing, even when wearing glasses/contacts?: No Does the patient have difficulty concentrating, remembering, or making decisions?: Yes Patient able to express need for assistance with ADLs?: Yes Does the patient have difficulty dressing or bathing?: No Independently performs ADLs?: Yes (appropriate for developmental age) Does the patient have difficulty walking or climbing stairs?: No Weakness of Legs: None Weakness of Arms/Hands: None  Home Assistive Devices/Equipment Home Assistive Devices/Equipment: None    Abuse/Neglect Assessment (Assessment to be complete while patient is alone) Physical Abuse:  (UTA due to mental status) Verbal Abuse:  (UTA due to mental status) Sexual Abuse:  (UTA due to mental status) Exploitation of patient/patient's resources:  (UTA due to mental status) Self-Neglect:  (UTA due to mental status) Values / Beliefs Cultural Requests During Hospitalization: None Spiritual Requests During Hospitalization: None   Advance Directives (For Healthcare) Does Patient Have a Medical Advance Directive?: No Would patient like information on creating a medical advance directive?: No - Patient declined    Additional Information 1:1 In Past 12 Months?:  (UTA due to mental status) CIRT Risk: Yes Elopement Risk: Yes Does patient have medical clearance?: Yes     Disposition:  Disposition Initial Assessment Completed for this Encounter: Yes Disposition of Patient: Inpatient treatment program Type of inpatient treatment program: Adult  [facility],Yoni Lobos Hines 04/18/2016 1:39 PM

## 2016-04-18 NOTE — ED Notes (Signed)
[facility]

## 2016-04-18 NOTE — ED Notes (Signed)
Lunch tray here 

## 2016-04-18 NOTE — ED Notes (Signed)
Pt alert and cooperative.  Moved to room T9, sitter with pt.  TTS notified pt now ready for TTS evaluation when they are.

## 2016-04-18 NOTE — ED Notes (Signed)
Purple scrubs given, patient was asked to change clothes.

## 2016-04-18 NOTE — ED Notes (Signed)
Spoke to pt regarding current needs.  Noticed she was watching a cooking show on tv. Asked pt if she liked to cook, pt stated yes, that she is a good cook. "Betty Henderson been trying to get me to do something for her for a long time"

## 2016-04-18 NOTE — BH Assessment (Signed)
Attempted assessment with pt but could not complete. Pt became very agitated, got up from hospital bed, put her clothes back on and walked out of room. Pt's nurse tried to redirect her back into her room but pt left the ED .  Therapist observed pt talking to an "imaginary friend" and mumbling words that were not understandable. Pt did not make good eye contact. Pt appeared to be responding to internal stimuli.   Orlie PollenAshley Karstyn Birkey, LPC, NCC,  LCAS-A Therapeutic Triage Specialist  04/18/2016 5:21 AM

## 2016-04-18 NOTE — ED Notes (Signed)
RN attempted to assess why pt was here; pt was talking to invisible person; pt was cursing at unknown person; Pt was unclear about reason for being here; RN will reassess with Md

## 2016-04-18 NOTE — ED Notes (Signed)
Pt unable to void at this time. 

## 2016-04-19 ENCOUNTER — Other Ambulatory Visit: Payer: Self-pay

## 2016-04-19 LAB — RAPID URINE DRUG SCREEN, HOSP PERFORMED
Amphetamines: NOT DETECTED
Barbiturates: NOT DETECTED
Benzodiazepines: NOT DETECTED
Cocaine: NOT DETECTED
Opiates: NOT DETECTED
Tetrahydrocannabinol: NOT DETECTED

## 2016-04-19 MED ORDER — DIPHENHYDRAMINE HCL 50 MG/ML IJ SOLN
25.0000 mg | Freq: Once | INTRAMUSCULAR | Status: AC
Start: 2016-04-19 — End: 2016-04-19
  Administered 2016-04-19: 25 mg via INTRAMUSCULAR
  Filled 2016-04-19: qty 1

## 2016-04-19 MED ORDER — STERILE WATER FOR INJECTION IJ SOLN
INTRAMUSCULAR | Status: AC
  Filled 2016-04-19: qty 10

## 2016-04-19 MED ORDER — ZIPRASIDONE MESYLATE 20 MG IM SOLR
20.0000 mg | Freq: Once | INTRAMUSCULAR | Status: AC
  Administered 2016-04-19: 20 mg via INTRAMUSCULAR
  Filled 2016-04-19: qty 20

## 2016-04-19 MED ORDER — LORAZEPAM 2 MG/ML IJ SOLN
2.0000 mg | Freq: Once | INTRAMUSCULAR | Status: AC
  Administered 2016-04-19: 2 mg via INTRAMUSCULAR
  Filled 2016-04-19: qty 1

## 2016-04-19 MED ORDER — STERILE WATER FOR INJECTION IJ SOLN
INTRAMUSCULAR | Status: AC
  Administered 2016-04-19: 3 mL
  Filled 2016-04-19: qty 10

## 2016-04-19 NOTE — BH Assessment (Signed)
Patient has been accepted to [facility].  Patient assigned to room 411-1 Accepting physician is Dr. Seth BakeLekwauwa Call report to [phone]  ER Staff, nurse Lowanda FosterBrittany

## 2016-04-19 NOTE — ED Provider Notes (Signed)
Patient seen for EMTALA. EMR reviewed. Patient alert and ambulatory, VS stable. Heart regular and lungs clear.Stable for transfer   Arby BarretteMarcy Burt Piatek, MD 04/19/16 [phone]

## 2016-04-19 NOTE — Progress Notes (Signed)
[name] at [facility] [phone]) called stating pt is accepted for admission and she will call back once bed has been assigned so that pt can be transferred.   Notified [facility].   Notified Pt's son [name] [phone].  [name], MSW, LCSW Clinical Social Work, Disposition  04/19/2016 [phone]

## 2016-04-19 NOTE — Progress Notes (Addendum)
Spoke with pt's son [phone] (emergency contact lists name Betty Henderson however he states his name is Betty Henderson). Son states he received call from pt yesterday after not hearing from her in 4 months, states she indicated she was going to a hospital but he could not extract details. States, "She has been in the hospital for mental health before, last time was a couple of years ago I think." States she is noncompliant with OP treatment and medications. Reports pt lives alone but he wonders if she is homeless at this point due to him not being able to determine precise situation. States he "hears different things out in the community about her." Reports pt has no close supports that he knows of, and it is not uncommon for him to not hear from pt for months. States, "psychiatric hospital is what she really needs." CSW explained psychiatry has recommended inpatient treatment for pt and that is being pursued. Son asks to be kept updated if she is transferred.  [name] called stating pt's referral is being reviewed- requested EKG. CSW requested EKG from ED and will fax once resulted.  Disposition: pending inpatient psychiatric admission Discussed with: pt's son and psychiatry team.  Ilean SkillMeghan Lucy Boardman, MSW, LCSW Clinical Social Work, Disposition  04/19/2016 [phone]

## 2016-04-19 NOTE — ED Notes (Signed)
Regular Diet was ordered for Patient for Lunch. 

## 2016-04-19 NOTE — ED Notes (Signed)
This RN called [facility] number to inquire about transport. Dispatch given name and number; will call back about status of pt's transport.

## 2016-04-19 NOTE — ED Notes (Signed)
0830 pt became increasingly aggressive in manner and speech after asking for cookies and  Being denied because it was not time. We then gave her cookies, and explained we needed EKG for [facility] to accept her. Pt still screaming and being more aggressive, security and [employer] at bedside  and in pod F, charge nurse called,  Dr Adela LankFloyd  Notified and meds ordered, pt pacing in room and cursing loudly and threatening aggressive behavior

## 2016-04-19 NOTE — ED Notes (Signed)
meds were given at [facility]0845

## 2016-04-19 NOTE — ED Notes (Signed)
[facility] called.  [facility] is [facility]. [facility]. Paperwork being prepared.

## 2016-04-19 NOTE — Progress Notes (Signed)
Faxed pt's EKG results and IVC to [facility] ([name]). [facility] and call back if able to accept pt today.  Ilean SkillMeghan Kathlen Sakurai, MSW, LCSW Clinical Social Work, Disposition  04/19/2016 [phone]

## 2016-04-20 NOTE — ED Notes (Signed)
After pt was told Simonne ComeLEO was here to transport [facility], she had an outburst over not wearing clothes to be transported in.  Pt shoved overbed table into wall, threw blankets on floor, and started yelling about lawyers and suing everybody.  In order to facilitate transport and safety for all involved, this RN allowed patient to get dressed in her clothing after first clearing it with the charge nurse, sheriff's deputy, and the accepting facility.

## 2016-05-04 DIAGNOSIS — R7989 Other specified abnormal findings of blood chemistry: Secondary | ICD-10-CM | POA: Insufficient documentation

## 2016-05-04 DIAGNOSIS — M1990 Unspecified osteoarthritis, unspecified site: Secondary | ICD-10-CM | POA: Insufficient documentation

## 2016-05-14 DIAGNOSIS — E119 Type 2 diabetes mellitus without complications: Secondary | ICD-10-CM | POA: Insufficient documentation

## 2016-11-23 ENCOUNTER — Emergency Department (HOSPITAL_COMMUNITY)
Admission: EM | Admit: 2016-11-23 | Discharge: 2016-11-24 | Disposition: A | Discharge status: Home / Self Care | Payer: Self-pay | Attending: Emergency Medicine | Admitting: Emergency Medicine

## 2016-11-23 ENCOUNTER — Encounter (HOSPITAL_COMMUNITY): Payer: Self-pay | Admitting: Nurse Practitioner

## 2016-11-23 DIAGNOSIS — M79604 Pain in right leg: Secondary | ICD-10-CM | POA: Insufficient documentation

## 2016-11-23 DIAGNOSIS — Z5321 Procedure and treatment not carried out due to patient leaving prior to being seen by health care provider: Secondary | ICD-10-CM | POA: Insufficient documentation

## 2016-11-23 DIAGNOSIS — M79605 Pain in left leg: Secondary | ICD-10-CM | POA: Insufficient documentation

## 2016-11-23 NOTE — ED Triage Notes (Signed)
Pt is presented by EMS, pt states she has been walking all day and both her legs and arms are hurting.

## 2016-11-24 NOTE — ED Notes (Signed)
Patient did not respond when called for V/S.  Patient did not notify staff when leaving facility.

## 2016-11-24 NOTE — ED Notes (Signed)
Called pt's name to update vitals, but no response from the waiting room.  

## 2016-11-26 ENCOUNTER — Ambulatory Visit (HOSPITAL_COMMUNITY)
Admission: EM | Admit: 2016-11-26 | Discharge: 2016-11-26 | Disposition: A | Discharge status: Home / Self Care | Payer: Self-pay | Attending: Family Medicine | Admitting: Family Medicine

## 2016-11-26 ENCOUNTER — Encounter (HOSPITAL_COMMUNITY): Payer: Self-pay | Admitting: *Deleted

## 2016-11-26 DIAGNOSIS — M25521 Pain in right elbow: Secondary | ICD-10-CM

## 2016-11-26 MED ORDER — IBUPROFEN 400 MG PO TABS
400.0000 mg | ORAL_TABLET | Freq: Four times a day (QID) | ORAL | 0 refills | Status: DC | PRN
Start: 2016-11-26 — End: 2016-11-26

## 2016-11-26 MED ORDER — IBUPROFEN 800 MG PO TABS
800.0000 mg | ORAL_TABLET | Freq: Once | ORAL | Status: AC
  Administered 2016-11-26: 800 mg via ORAL

## 2016-11-26 MED ORDER — IBUPROFEN 800 MG PO TABS
ORAL_TABLET | ORAL | Status: AC
  Filled 2016-11-26: qty 1

## 2016-11-26 MED ORDER — IBUPROFEN 400 MG PO TABS: 400.0000 mg | ORAL_TABLET | Freq: Four times a day (QID) | ORAL | 0 refills | Status: DC | PRN
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
## 2016-11-26 NOTE — Discharge Instructions (Signed)
Take ibuprofen for pain and inflammation. Ice/heat compress. You can use the ace bandage to help with support as per your request. Monitor for worsening of symptoms, changes in symptoms follow up as needed for reevaluation.

## 2016-11-26 NOTE — ED Triage Notes (Signed)
Pt  alledges    r  Arm  Pain      From   An  Event        That   Happened   About   1  Month  Ago        She  [facility]   [facility]   [facility]      She  Reports  Pain on  Movement   And  posistion

## 2016-11-28 ENCOUNTER — Encounter (HOSPITAL_COMMUNITY): Payer: Self-pay | Admitting: *Deleted

## 2016-11-28 ENCOUNTER — Emergency Department (HOSPITAL_COMMUNITY)
Admission: EM | Admit: 2016-11-28 | Discharge: 2016-11-28 | Disposition: A | Discharge status: Home / Self Care | Payer: Self-pay | Attending: Emergency Medicine | Admitting: Emergency Medicine

## 2016-11-28 DIAGNOSIS — Z5321 Procedure and treatment not carried out due to patient leaving prior to being seen by health care provider: Secondary | ICD-10-CM | POA: Insufficient documentation

## 2016-11-28 DIAGNOSIS — M79601 Pain in right arm: Secondary | ICD-10-CM | POA: Insufficient documentation

## 2016-11-28 DIAGNOSIS — R42 Dizziness and giddiness: Secondary | ICD-10-CM | POA: Insufficient documentation

## 2016-11-28 DIAGNOSIS — F17210 Nicotine dependence, cigarettes, uncomplicated: Secondary | ICD-10-CM | POA: Insufficient documentation

## 2016-11-28 LAB — BASIC METABOLIC PANEL
ANION GAP: 11 (ref 5–15)
BUN: 26 mg/dL — AB (ref 6–20)
CO2: 24 mmol/L (ref 22–32)
CREATININE: 1.42 mg/dL — AB (ref 0.44–1.00)
Calcium: 8.7 mg/dL — ABNORMAL LOW (ref 8.9–10.3)
Chloride: 96 mmol/L — ABNORMAL LOW (ref 101–111)
GFR calc Af Amer: 47 mL/min — ABNORMAL LOW (ref >60)
GFR calc non Af Amer: 40 mL/min — ABNORMAL LOW (ref >60)
Glucose, Bld: 120 mg/dL — ABNORMAL HIGH (ref 65–99)
Potassium: 3.6 mmol/L (ref 3.5–5.1)
SODIUM: 131 mmol/L — AB (ref 135–145)

## 2016-11-28 LAB — CBC
HCT: 34.8 % — ABNORMAL LOW (ref 36.0–46.0)
Hemoglobin: 11.4 g/dL — ABNORMAL LOW (ref 12.0–15.0)
MCH: 28.4 pg (ref 26.0–34.0)
MCHC: 32.8 g/dL (ref 30.0–36.0)
MCV: 86.8 fL (ref 78.0–100.0)
PLATELETS: 308 10*3/uL (ref 150–400)
RBC: 4.01 MIL/uL (ref 3.87–5.11)
RDW: 13.8 % (ref 11.5–15.5)
WBC: 8.8 10*3/uL (ref 4.0–10.5)

## 2016-11-28 LAB — CBG MONITORING, ED: Glucose-Capillary: 119 mg/dL — ABNORMAL HIGH (ref 65–99)

## 2016-11-28 NOTE — ED Notes (Signed)
No response from pt in lobby 

## 2016-11-28 NOTE — ED Triage Notes (Signed)
Pt arrived via [facility] with complaints of right arm pain and bandage to site.  Removed bandage and no injury noted.  Pt reports black outs or dizzy spells.  Pt rambling about jobs, homeless, going to jail.  Denies SI/HI.

## 2016-11-28 NOTE — ED Notes (Signed)
Patient called several times but no answer.

## 2016-11-29 ENCOUNTER — Encounter (HOSPITAL_COMMUNITY): Payer: Self-pay | Admitting: *Deleted

## 2016-11-29 ENCOUNTER — Emergency Department (HOSPITAL_COMMUNITY)
Admission: EM | Admit: 2016-11-29 | Discharge: 2016-11-29 | Disposition: A | Discharge status: Home / Self Care | Payer: Self-pay | Attending: Emergency Medicine | Admitting: Emergency Medicine

## 2016-11-29 DIAGNOSIS — M79601 Pain in right arm: Secondary | ICD-10-CM

## 2016-11-29 LAB — I-STAT CHEM 8, ED
BUN: 24 mg/dL — ABNORMAL HIGH (ref 6–20)
CHLORIDE: 96 mmol/L — AB (ref 101–111)
CREATININE: 1.3 mg/dL — AB (ref 0.44–1.00)
Calcium, Ion: 1.1 mmol/L — ABNORMAL LOW (ref 1.15–1.40)
GLUCOSE: 84 mg/dL (ref 65–99)
HCT: 33 % — ABNORMAL LOW (ref 36.0–46.0)
Hemoglobin: 11.2 g/dL — ABNORMAL LOW (ref 12.0–15.0)
POTASSIUM: 3.3 mmol/L — AB (ref 3.5–5.1)
Sodium: 134 mmol/L — ABNORMAL LOW (ref 135–145)
TCO2: 27 mmol/L (ref 22–32)

## 2016-11-29 MED ORDER — SODIUM CHLORIDE 0.9 % IV BOLUS (SEPSIS)
1000.0000 mL | Freq: Once | INTRAVENOUS | Status: AC
  Administered 2016-11-29: 1000 mL via INTRAVENOUS

## 2016-11-29 NOTE — Discharge Instructions (Signed)
Please follow up with your doctor.

## 2016-11-29 NOTE — ED Triage Notes (Signed)
Pt c/o swelling to R arm for 2 days; full ROM with swelling noted. Pt has flight of ideas at times. Denies SI/HI/hallucinations.

## 2016-11-29 NOTE — ED Provider Notes (Signed)
[facility]   CSN: [id number] Arrival date & time: 11/28/16  2320     History   Chief Complaint Chief Complaint  Patient presents with  . Arm Swelling    HPI Betty Henderson is a 56 y.o. female.  Patient presents to the emergency department with chief complaint of right arm pain. She reports a history of the same. She has been seen multiple times by several different providers for the same as well. She denies any known injuries. She states that her arm seems swollen. She denies any pain with movement. She denies any chest pain or shortness breath. Denies any nausea, vomiting, diarrhea. There are no other associated symptoms.   The history is provided by the patient. No language interpreter was used.    History reviewed. No pertinent past medical history.  There are no active problems to display for this patient.   Past Surgical History:  Procedure Laterality Date  . MOUTH SURGERY    . TUBAL LIGATION      OB History    No data available       Home Medications    Prior to Admission medications   Medication Sig Start Date End Date Taking? Authorizing Provider  ibuprofen (ADVIL,MOTRIN) 400 MG tablet Take 1 tablet (400 mg total) by mouth every 6 (six) hours as needed. 11/26/16   Belinda Fisher, PA-C    Family History No family history on file.  Social History Social History  Substance Use Topics  . Smoking status: Current Every Day Smoker    Packs/day: 1.00    Types: Cigarettes  . Smokeless tobacco: Never Used  . Alcohol use No     Allergies   Other   Review of Systems Review of Systems  All other systems reviewed and are negative.    Physical Exam Updated Vital Signs BP (!) 102/58   Pulse 78   Temp 98.4 F (36.9 C) (Oral)   Resp 16   SpO2 100%   Physical Exam Nursing note and vitals reviewed.  Constitutional: Pt appears well-developed and well-nourished. No distress.  HENT:  Head: Normocephalic and atraumatic.  Eyes:  Conjunctivae are normal.  Neck: Normal range of motion.  Cardiovascular: Normal rate, regular rhythm. Intact distal pulses.   Capillary refill < 3 sec.  Pulmonary/Chest: Effort normal and breath sounds normal.  Musculoskeletal:  Right upper extremity Pt exhibits no tenderness to palpation, range of motion strength is 5/5.    Neurological: Pt  is alert. Coordination normal.  Sensation: 5/5 Skin: Skin is warm and dry. Pt is not diaphoretic.  No evidence of open wound or skin tenting Psychiatric: Pt has a normal mood and affect.     ED Treatments / Results  Labs (all labs ordered are listed, but only abnormal results are displayed) Labs Reviewed  I-STAT CHEM 8, ED - Abnormal; Notable for the following:       Result Value   Sodium 134 (*)    Potassium 3.3 (*)    Chloride 96 (*)    BUN 24 (*)    Creatinine, Ser 1.30 (*)    Calcium, Ion 1.10 (*)    Hemoglobin 11.2 (*)    HCT 33.0 (*)    All other components within normal limits    EKG  EKG Interpretation None       Radiology No results found.  Procedures Procedures (including critical care time)  Medications Ordered in ED Medications - No data to display   Initial  Impression / Assessment and Plan / ED Course  I have reviewed the triage vital signs and the nursing notes.  Pertinent labs & imaging results that were available during my care of the patient were reviewed by me and considered in my medical decision making (see chart for details).     Patient with chronic right arm pain. She has strong pulses. There is no evidence of infection. No evidence of trauma. She moves the arm without any complaint.    Seems a little dry, Cr is up a little.  Has been lying and sleeping all night.  BP improves when sitting up and standing.   DC to home after finishing fluids.  Final Clinical Impressions(s) / ED Diagnoses   Final diagnoses:  Pain of right upper extremity    New Prescriptions New Prescriptions   No  medications on file     Roxy HorsemanBrowning, Sharel Behne, Cordelia Poche-C 11/29/16 [id number]    Zadie RhineWickline, Donald, MD 11/29/16 [phone]

## 2016-12-03 ENCOUNTER — Emergency Department (HOSPITAL_COMMUNITY)
Admission: EM | Admit: 2016-12-03 | Discharge: 2016-12-03 | Disposition: A | Discharge status: Home / Self Care | Payer: Self-pay | Attending: Emergency Medicine | Admitting: Emergency Medicine

## 2016-12-03 ENCOUNTER — Encounter (HOSPITAL_COMMUNITY): Payer: Self-pay

## 2016-12-03 DIAGNOSIS — R55 Syncope and collapse: Secondary | ICD-10-CM | POA: Insufficient documentation

## 2016-12-03 DIAGNOSIS — E860 Dehydration: Secondary | ICD-10-CM | POA: Insufficient documentation

## 2016-12-03 DIAGNOSIS — R112 Nausea with vomiting, unspecified: Secondary | ICD-10-CM | POA: Insufficient documentation

## 2016-12-03 DIAGNOSIS — K029 Dental caries, unspecified: Secondary | ICD-10-CM | POA: Insufficient documentation

## 2016-12-03 DIAGNOSIS — F17210 Nicotine dependence, cigarettes, uncomplicated: Secondary | ICD-10-CM | POA: Insufficient documentation

## 2016-12-03 LAB — CBC
HCT: 33.7 % — ABNORMAL LOW (ref 36.0–46.0)
HEMOGLOBIN: 11 g/dL — AB (ref 12.0–15.0)
MCH: 28.5 pg (ref 26.0–34.0)
MCHC: 32.6 g/dL (ref 30.0–36.0)
MCV: 87.3 fL (ref 78.0–100.0)
Platelets: 279 10*3/uL (ref 150–400)
RBC: 3.86 MIL/uL — ABNORMAL LOW (ref 3.87–5.11)
RDW: 14.5 % (ref 11.5–15.5)
WBC: 11 10*3/uL — AB (ref 4.0–10.5)

## 2016-12-03 LAB — URINALYSIS, ROUTINE W REFLEX MICROSCOPIC
BILIRUBIN URINE: NEGATIVE
GLUCOSE, UA: NEGATIVE mg/dL
HGB URINE DIPSTICK: NEGATIVE
Ketones, ur: NEGATIVE mg/dL
NITRITE: NEGATIVE
PROTEIN: NEGATIVE mg/dL
Specific Gravity, Urine: 1.011 (ref 1.005–1.030)
pH: 5 (ref 5.0–8.0)

## 2016-12-03 LAB — HEPATIC FUNCTION PANEL
ALT: 37 U/L (ref 14–54)
AST: 57 U/L — ABNORMAL HIGH (ref 15–41)
Albumin: 2.8 g/dL — ABNORMAL LOW (ref 3.5–5.0)
Alkaline Phosphatase: 49 U/L (ref 38–126)
BILIRUBIN DIRECT: 0.2 mg/dL (ref 0.1–0.5)
BILIRUBIN INDIRECT: 0.5 mg/dL (ref 0.3–0.9)
TOTAL PROTEIN: 6.2 g/dL — AB (ref 6.5–8.1)
Total Bilirubin: 0.7 mg/dL (ref 0.3–1.2)

## 2016-12-03 LAB — BASIC METABOLIC PANEL
ANION GAP: 6 (ref 5–15)
BUN: 11 mg/dL (ref 6–20)
CALCIUM: 8.2 mg/dL — AB (ref 8.9–10.3)
CO2: 25 mmol/L (ref 22–32)
Chloride: 101 mmol/L (ref 101–111)
Creatinine, Ser: 0.95 mg/dL (ref 0.44–1.00)
GFR calc Af Amer: >60 mL/min (ref >60)
Glucose, Bld: 90 mg/dL (ref 65–99)
POTASSIUM: 4.5 mmol/L (ref 3.5–5.1)
SODIUM: 132 mmol/L — AB (ref 135–145)

## 2016-12-03 LAB — LIPASE, BLOOD: LIPASE: 25 U/L (ref 11–51)

## 2016-12-03 LAB — I-STAT TROPONIN, ED: Troponin i, poc: 0 ng/mL (ref 0.00–0.08)

## 2016-12-03 LAB — I-STAT CG4 LACTIC ACID, ED: LACTIC ACID, VENOUS: 0.7 mmol/L (ref 0.5–1.9)

## 2016-12-03 MED ORDER — PENICILLIN V POTASSIUM 250 MG PO TABS
500.0000 mg | ORAL_TABLET | Freq: Once | ORAL | Status: AC
  Administered 2016-12-03: 500 mg via ORAL
  Filled 2016-12-03: qty 2

## 2016-12-03 MED ORDER — SODIUM CHLORIDE 0.9 % IV BOLUS (SEPSIS)
1000.0000 mL | Freq: Once | INTRAVENOUS | Status: AC
  Administered 2016-12-03: 1000 mL via INTRAVENOUS

## 2016-12-03 MED ORDER — ONDANSETRON HCL 4 MG/2ML IJ SOLN
4.0000 mg | Freq: Once | INTRAMUSCULAR | Status: AC
  Administered 2016-12-03: 4 mg via INTRAVENOUS
  Filled 2016-12-03: qty 2

## 2016-12-03 MED ORDER — PENICILLIN V POTASSIUM 500 MG PO TABS: 500.0000 mg | ORAL_TABLET | Freq: Four times a day (QID) | ORAL | 0 refills | Status: AC

## 2016-12-03 MED ORDER — PROMETHAZINE HCL 25 MG PO TABS: 25.0000 mg | ORAL_TABLET | Freq: Four times a day (QID) | ORAL | 0 refills | Status: DC | PRN

## 2016-12-03 NOTE — ED Notes (Signed)
Pt states she just wants food and she will leave

## 2016-12-03 NOTE — Discharge Instructions (Addendum)
To find a primary care or specialty doctor please call [phone] or [phone] to access "[place] Find a Doctor Service." ° °You may also go on the [facility] website at [email or web address] ° °There are also multiple [facility], [facility] practices throughout the [place] that are frequently accepting new patients. You may find a clinic that is close to your home and contact them. ° °[facility] -  °[address] °[place] [address] °[phone] ° ° °[facility] -  °[address] °[place] [address] [phone] ° ° °[facility] - °[address]  °[place] [address] [phone] ° ° °

## 2016-12-03 NOTE — ED Notes (Signed)
Pt requesting buss pass. 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

## 2016-12-03 NOTE — ED Notes (Signed)
Breakfast tray ordered 

## 2016-12-03 NOTE — ED Provider Notes (Signed)
TIME SEEN: 7:03 AM  CHIEF COMPLAINT: Syncope, vomiting  HPI: Patient is a 56 year old female with history of schizoaffective disorder who presents to the emergency department with complaints of syncope and vomiting. Reports she has been vomiting for the past 2 weeks. Reports she is having "blackouts" where she feels lightheaded especially with standing. She denies chest pain or shortness of breath despite nursing notes. No diarrhea. No abdominal pain. She denies drug or alcohol use. No fever. Patient does have a history of psychosis. She denies SI, HI or hallucinations at this time. She denies any injury from these "blackouts". No seizure-like activity. Patient complains of dental pain. No facial swelling. No difficulty swallowing, speaking or breathing.  ROS: See HPI Constitutional: no fever  Eyes: no drainage  ENT: no runny nose   Cardiovascular:  no chest pain  Resp: no SOB  GI: no vomiting GU: no dysuria Integumentary: no rash  Allergy: no hives  Musculoskeletal: no leg swelling  Neurological: no slurred speech ROS otherwise negative  PAST MEDICAL HISTORY/PAST SURGICAL HISTORY:  History reviewed. No pertinent past medical history.  MEDICATIONS:  Prior to Admission medications   Medication Sig Start Date End Date Taking? Authorizing Provider  ibuprofen (ADVIL,MOTRIN) 400 MG tablet Take 1 tablet (400 mg total) by mouth every 6 (six) hours as needed. Patient not taking: Reported on 11/29/2016 11/26/16   Belinda Fisher, PA-C    ALLERGIES:  Allergies  Allergen Reactions  . Other     PT reports having environmental allergies which she used to receive shots for, but cannot specify exact allergens    SOCIAL HISTORY:  Social History  Substance Use Topics  . Smoking status: Current Every Day Smoker    Packs/day: 1.00    Types: Cigarettes  . Smokeless tobacco: Never Used  . Alcohol use No    FAMILY HISTORY: No family history on file.  EXAM: BP (!) 90/54   Pulse 72   Temp 98.4 F  (36.9 C) (Rectal)   Resp 20   Ht  (1.676 m)   Wt 59 kg (130 lb)   SpO2 100%   BMI 20.98 kg/m  CONSTITUTIONAL: Alert and oriented and responds appropriately to questions. Thin, chronically ill-appearing HEAD: Normocephalic EYES: Conjunctivae clear, pupils appear equal, EOMI ENT: normal nose; moist mucous membranes; No pharyngeal erythema or petechiae, no tonsillar hypertrophy or exudate, no uvular deviation, no unilateral swelling, no trismus or drooling, no muffled voice, normal phonation, no stridor, multiple dental caries present With poor dentition and multiple missing teeth, no drainable dental abscess noted, no Ludwig's angina, tongue sits flat in the bottom of the mouth, no angioedema, no facial erythema or warmth, no facial swelling; no pain with movement of the neck. NECK: Supple, no meningismus, no nuchal rigidity, no LAD  CARD: RRR; S1 and S2 appreciated; no murmurs, no clicks, no rubs, no gallops RESP: Normal chest excursion without splinting or tachypnea; breath sounds clear and equal bilaterally; no wheezes, no rhonchi, no rales, no hypoxia or respiratory distress, speaking full sentences ABD/GI: Normal bowel sounds; non-distended; soft, non-tender, no rebound, no guarding, no peritoneal signs, no hepatosplenomegaly BACK:  The back appears normal and is non-tender to palpation, there is no CVA tenderness EXT: Normal ROM in all joints; non-tender to palpation; no edema; normal capillary refill; no cyanosis, no calf tenderness or swelling    SKIN: Normal color for age and race; warm; no rash NEURO: Moves all extremities equally, Normal speech, normal sensation, cranial nerves II through XII intact.  PSYCH: Patient does have some tangential thought process but does not appear manic. She has no SI, HI or hallucinations. She appears disheveled.  MEDICAL DECISION MAKING: Patient here with syncope and vomiting. Abdominal exam benign. She denies to me any chest pain or shortness of  breath and her EKG shows no ischemic abnormality. She is hypotensive here but this does appear to be her baseline. She has a normal rectal temperature. Basic blood work ordered in triage has been unremarkable. We will add on a lactate and a troponin. Urine shows no sign of infection and no ketones. Will give IV fluids, Zofran. Patient is Already asking for something to eat.She does seem to ramble on about different things but does not appear acutely psychotic and has no SI, HI or hallucinations. I do not feel she needs a TTS evaluation.  ED PROGRESS: Patient's troponin is negative. Lactate is normal. Her blood pressure is improving significantly with IV hydration. Suspect dehydration from vomiting. Her abdominal exam is benign. I do not feel she needs CT imaging. I doubt that this is ACS or PE. No focal neurologic deficits.  I feel she is safe to be discharged. We'll give her outpatient dental follow-up and discharge on penicillin for prophylaxis and complaints of dental pain.  Discussed return precautions with patient. She is comfortable with this plan.   At this time, I do not feel there is any life-threatening condition present. I have reviewed and discussed all results (EKG, imaging, lab, urine as appropriate) and exam findings with patient/family. I have reviewed nursing notes and appropriate previous records.  I feel the patient is safe to be discharged home without further emergent workup and can continue workup as an outpatient as needed. Discussed usual and customary return precautions. Patient/family verbalize understanding and are comfortable with this plan.  Outpatient follow-up has been provided if needed. All questions have been answered.      EKG Interpretation  Date/Time:  Tuesday December 03 2016 01:39:13 EDT Ventricular Rate:  72 PR Interval:  148 QRS Duration: 82 QT Interval:  396 QTC Calculation: 433 R Axis:   81 Text Interpretation:  Normal sinus rhythm Normal ECG Confirmed  by Christionna Poland, Baxter HireKristen [phone]([id number]) on 12/03/2016 6:37:25 AM         Javoris Star, Layla MawKristen N, DO 12/03/16 [id number]

## 2016-12-03 NOTE — ED Notes (Signed)
Pt states she understands instructions. Bus pass provided. Pt ambulates easily in room. Dishcarge with steady gait.

## 2016-12-03 NOTE — ED Triage Notes (Signed)
Pt states that she is SOB for the past two week because she is walking from the bus stop. Pt also stated that she has been passing out. Pt is noncompliant with triage question, cussing at staff and rambling about random things. Pt denies ETOH or drug use.

## 2016-12-03 NOTE — ED Notes (Signed)
Pt given orange juice, peanut butter and crackers

## 2016-12-08 ENCOUNTER — Emergency Department (HOSPITAL_COMMUNITY)
Admission: EM | Admit: 2016-12-08 | Discharge: 2016-12-09 | Disposition: A | Discharge status: Home / Self Care | Payer: Self-pay | Attending: Emergency Medicine | Admitting: Emergency Medicine

## 2016-12-08 DIAGNOSIS — M79601 Pain in right arm: Secondary | ICD-10-CM | POA: Insufficient documentation

## 2016-12-08 DIAGNOSIS — Z79899 Other long term (current) drug therapy: Secondary | ICD-10-CM | POA: Insufficient documentation

## 2016-12-08 DIAGNOSIS — F17210 Nicotine dependence, cigarettes, uncomplicated: Secondary | ICD-10-CM | POA: Insufficient documentation

## 2016-12-08 NOTE — ED Triage Notes (Signed)
Pt here via EMS with c/o swelling to lower bilateral extremities. Pt has been evaluated for this before at [facility]

## 2016-12-08 NOTE — ED Notes (Signed)
When asked what caused her to come to the ED, the patient reported "my arm and legs are broke.  My chiropractor said my arms and legs are broke.  They are swelling."  No swelling noted.  Full ROM noted.  Pt walked to room w/o difficulty.

## 2016-12-09 ENCOUNTER — Encounter (HOSPITAL_COMMUNITY): Payer: Self-pay | Admitting: Emergency Medicine

## 2016-12-09 DIAGNOSIS — Z5321 Procedure and treatment not carried out due to patient leaving prior to being seen by health care provider: Secondary | ICD-10-CM | POA: Insufficient documentation

## 2016-12-09 DIAGNOSIS — R600 Localized edema: Secondary | ICD-10-CM | POA: Insufficient documentation

## 2016-12-09 MED ORDER — IBUPROFEN 200 MG PO TABS
400.0000 mg | ORAL_TABLET | Freq: Once | ORAL | Status: AC
  Administered 2016-12-09: 400 mg via ORAL
  Filled 2016-12-09: qty 2

## 2016-12-09 NOTE — Discharge Instructions (Signed)
For pain control please take Ibuprofen (also known as Motrin or Advil)  (this is normally 2 over the counter pills) every 6 hours. Take with food to minimize stomach irritation.  Do not hesitate to return to the emergency room for any new, worsening or concerning symptoms.  Please obtain primary care using resource guide below. Let them know that you were seen in the emergency room and that they will need to obtain records for further outpatient management.

## 2016-12-09 NOTE — ED Triage Notes (Signed)
Pt states she has swelling in her lower legs and feet and was seen here for same yesterday  Pt states she never left the hospital grounds and the swelling is coming back
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
## 2016-12-10 ENCOUNTER — Emergency Department (HOSPITAL_COMMUNITY)
Admission: EM | Admit: 2016-12-10 | Discharge: 2016-12-10 | Disposition: A | Discharge status: Home / Self Care | Payer: Self-pay | Attending: Emergency Medicine | Admitting: Emergency Medicine

## 2016-12-10 NOTE — ED Notes (Signed)
Called  No response from lobby 

## 2017-03-11 ENCOUNTER — Encounter (HOSPITAL_COMMUNITY): Payer: Self-pay

## 2017-03-11 ENCOUNTER — Emergency Department (HOSPITAL_COMMUNITY)
Admission: EM | Admit: 2017-03-11 | Discharge: 2017-03-11 | Discharge status: Against Medical Advice | Payer: Self-pay | Attending: Emergency Medicine | Admitting: Emergency Medicine

## 2017-03-11 ENCOUNTER — Encounter: Payer: Self-pay | Admitting: General Practice

## 2017-03-11 ENCOUNTER — Encounter (HOSPITAL_COMMUNITY): Payer: Self-pay | Admitting: Family Medicine

## 2017-03-11 ENCOUNTER — Emergency Department (HOSPITAL_COMMUNITY)
Admission: EM | Admit: 2017-03-11 | Discharge: 2017-03-11 | Disposition: A | Discharge status: Home / Self Care | Payer: Self-pay | Attending: Emergency Medicine | Admitting: Emergency Medicine

## 2017-03-11 ENCOUNTER — Emergency Department (HOSPITAL_COMMUNITY): Payer: Self-pay

## 2017-03-11 ENCOUNTER — Emergency Department (HOSPITAL_COMMUNITY)
Admission: EM | Admit: 2017-03-11 | Discharge: 2017-03-12 | Disposition: A | Discharge status: Home / Self Care | Payer: Self-pay | Attending: Emergency Medicine | Admitting: Emergency Medicine

## 2017-03-11 ENCOUNTER — Other Ambulatory Visit: Payer: Self-pay

## 2017-03-11 DIAGNOSIS — F17210 Nicotine dependence, cigarettes, uncomplicated: Secondary | ICD-10-CM | POA: Insufficient documentation

## 2017-03-11 DIAGNOSIS — R52 Pain, unspecified: Secondary | ICD-10-CM

## 2017-03-11 DIAGNOSIS — Z5321 Procedure and treatment not carried out due to patient leaving prior to being seen by health care provider: Secondary | ICD-10-CM | POA: Insufficient documentation

## 2017-03-11 DIAGNOSIS — R1084 Generalized abdominal pain: Secondary | ICD-10-CM | POA: Insufficient documentation

## 2017-03-11 DIAGNOSIS — F259 Schizoaffective disorder, unspecified: Secondary | ICD-10-CM

## 2017-03-11 DIAGNOSIS — M791 Myalgia, unspecified site: Secondary | ICD-10-CM

## 2017-03-11 DIAGNOSIS — M7918 Myalgia, other site: Secondary | ICD-10-CM | POA: Insufficient documentation

## 2017-03-11 LAB — CBC WITH DIFFERENTIAL/PLATELET
BASOS PCT: 0 %
Basophils Absolute: 0 10*3/uL (ref 0.0–0.1)
EOS ABS: 0.3 10*3/uL (ref 0.0–0.7)
Eosinophils Relative: 5 %
HCT: 36.2 % (ref 36.0–46.0)
HEMOGLOBIN: 12 g/dL (ref 12.0–15.0)
Lymphocytes Relative: 38 %
Lymphs Abs: 2.4 10*3/uL (ref 0.7–4.0)
MCH: 30.1 pg (ref 26.0–34.0)
MCHC: 33.1 g/dL (ref 30.0–36.0)
MCV: 90.7 fL (ref 78.0–100.0)
Monocytes Absolute: 0.4 10*3/uL (ref 0.1–1.0)
Monocytes Relative: 6 %
NEUTROS PCT: 51 %
Neutro Abs: 3.3 10*3/uL (ref 1.7–7.7)
Platelets: 221 10*3/uL (ref 150–400)
RBC: 3.99 MIL/uL (ref 3.87–5.11)
RDW: 12.9 % (ref 11.5–15.5)
WBC: 6.4 10*3/uL (ref 4.0–10.5)

## 2017-03-11 LAB — RAPID URINE DRUG SCREEN, HOSP PERFORMED
AMPHETAMINES: NOT DETECTED
BENZODIAZEPINES: NOT DETECTED
Barbiturates: NOT DETECTED
Cocaine: NOT DETECTED
OPIATES: NOT DETECTED
Tetrahydrocannabinol: NOT DETECTED

## 2017-03-11 LAB — COMPREHENSIVE METABOLIC PANEL
ALK PHOS: 62 U/L (ref 38–126)
ALT: 22 U/L (ref 14–54)
AST: 21 U/L (ref 15–41)
Albumin: 4.1 g/dL (ref 3.5–5.0)
Anion gap: 6 (ref 5–15)
BUN: 16 mg/dL (ref 6–20)
CALCIUM: 9.6 mg/dL (ref 8.9–10.3)
CHLORIDE: 109 mmol/L (ref 101–111)
CO2: 27 mmol/L (ref 22–32)
CREATININE: 0.71 mg/dL (ref 0.44–1.00)
Glucose, Bld: 115 mg/dL — ABNORMAL HIGH (ref 65–99)
Potassium: 3.9 mmol/L (ref 3.5–5.1)
Sodium: 142 mmol/L (ref 135–145)
Total Bilirubin: 0.2 mg/dL — ABNORMAL LOW (ref 0.3–1.2)
Total Protein: 7.6 g/dL (ref 6.5–8.1)

## 2017-03-11 LAB — LIPASE, BLOOD: LIPASE: 45 U/L (ref 11–51)

## 2017-03-11 MED ORDER — NAPROXEN 500 MG PO TABS
500.0000 mg | ORAL_TABLET | Freq: Once | ORAL | Status: AC
  Administered 2017-03-11: 500 mg via ORAL
  Filled 2017-03-11: qty 1

## 2017-03-11 MED ORDER — SODIUM CHLORIDE 0.9 % IV BOLUS (SEPSIS)
1000.0000 mL | Freq: Once | INTRAVENOUS | Status: AC
  Administered 2017-03-11: 1000 mL via INTRAVENOUS

## 2017-03-11 MED ORDER — ACETAMINOPHEN 325 MG PO TABS
650.0000 mg | ORAL_TABLET | Freq: Once | ORAL | Status: AC
  Administered 2017-03-11: 650 mg via ORAL
  Filled 2017-03-11: qty 2
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
## 2017-03-11 NOTE — Discharge Instructions (Signed)
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
## 2017-03-11 NOTE — ED Notes (Signed)
Bed: WA09 Expected date:  Expected time:  Means of arrival:  Comments: EMS hematuria

## 2017-03-11 NOTE — ED Triage Notes (Signed)
Pt brought in by [facility]. Pt is c/o "pain all over" she has also been "spitting up" blood for over a month. Pt is uncooperative with assessment by EMS. [place] train [place].

## 2017-03-11 NOTE — ED Notes (Addendum)
Called for Fast track. No answer x 3.

## 2017-03-11 NOTE — ED Notes (Signed)
Pt was able to ambulate to restroom without assistance. 

## 2017-03-11 NOTE — ED Triage Notes (Signed)
Patient is complaining about "hurting all over". During triage is has flight of ideas but will answer appropriate with most questions. She was seen earlier today Dr Bebe ShaggyWickline and discharged. Earlier today she was found wandering the [facility] by social work. Patient was provided a bus pass but [place].

## 2017-03-11 NOTE — Progress Notes (Signed)
[facility] CSW Progress Note  Patient found wandering in [facility] administrative office area, CSW asked to assist as needed. Per record, patient was recently discharged from ED.   CSW spoke w patient, determined she wanted bus pass to return [place].  Patient states she asked for bus pass in ED but was told there were none left.  Was given bus pass.  States she is able to ride bus to [place] [place] [place] [facility], then can contact son to pick her up.  Escorted to front door, states she is able to make her way to bus stop to await bus.  Santa GeneraAnne Ludean Duhart, LCSW Clinical Social Worker Phone:  [phone]
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
## 2017-03-11 NOTE — ED Notes (Signed)
Bed: WTR8 Expected date:  Expected time:  Means of arrival:  Comments: 

## 2017-03-11 NOTE — ED Triage Notes (Signed)
Called to treatment room x 2. No answer
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
## 2017-03-12 ENCOUNTER — Other Ambulatory Visit: Payer: Self-pay

## 2017-03-12 DIAGNOSIS — M79601 Pain in right arm: Secondary | ICD-10-CM | POA: Insufficient documentation

## 2017-03-12 NOTE — ED Notes (Signed)
Patient refused discharge VS. Pt unhappy she was being discharged "kicked out". Explained to pt she could wait in waiting room. Pt a/ox4 and ambulatory upon discharge.

## 2017-03-13 ENCOUNTER — Emergency Department (HOSPITAL_COMMUNITY): Payer: Self-pay

## 2017-03-13 ENCOUNTER — Other Ambulatory Visit: Payer: Self-pay

## 2017-03-13 ENCOUNTER — Emergency Department (HOSPITAL_COMMUNITY)
Admission: EM | Admit: 2017-03-13 | Discharge: 2017-03-13 | Disposition: A | Discharge status: Home / Self Care | Payer: Self-pay | Attending: Emergency Medicine | Admitting: Emergency Medicine

## 2017-03-13 ENCOUNTER — Encounter (HOSPITAL_COMMUNITY): Payer: Self-pay | Admitting: Emergency Medicine

## 2017-03-13 DIAGNOSIS — Z139 Encounter for screening, unspecified: Secondary | ICD-10-CM

## 2017-03-13 NOTE — ED Provider Notes (Signed)
[facility] EM[facility]ENC[facility] Provider Note   CSN: [id number] Arrival date & time: 03/12/17  2355     History   Chief Complaint Chief Complaint  Patient presents with  . Arm Pain    HPI Betty Henderson is a 56 y.o. female.  The history is provided by the patient and medical records.   56 year old female presenting to the ED with arm pain.  Per triage note patient was complaining of arm pain and requested an x-ray of her arm.  When I asked her about this she denies arm pain.  I asked her if she has had any problems with her arm and she replies "no, it is fine".  States she just wants a cup of coffee.  History reviewed. No pertinent past medical history.  There are no active problems to display for this patient.   Past Surgical History:  Procedure Laterality Date  . MOUTH SURGERY    . TUBAL LIGATION      OB History    No data available       Home Medications    Prior to Admission medications   Not on File    Family History Family History  Family history unknown: Yes    Social History Social History   Tobacco Use  . Smoking status: Current Some Day Smoker    Packs/day: 1.00    Types: Cigarettes  . Smokeless tobacco: Never Used  Substance Use Topics  . Alcohol use: No  . Drug use: No     Allergies   Other   Review of Systems Review of Systems  Musculoskeletal: Positive for arthralgias.  All other systems reviewed and are negative.    Physical Exam Updated Vital Signs Ht 5\' 6"  (1.676 m)   Wt 54.4 kg (120 lb)   BMI 19.37 kg/m   Physical Exam  Constitutional: She is oriented to person, place, and time. She appears well-developed and well-nourished.  Constantly fidgeting in chair  HENT:  Head: Normocephalic and atraumatic.  Mouth/Throat: Oropharynx is clear and moist.  Eyes: Conjunctivae and EOM are normal. Pupils are equal, round, and reactive to light.  Neck: Normal range of motion.  Cardiovascular: Normal  rate, regular rhythm and normal heart sounds.  Pulmonary/Chest: Effort normal and breath sounds normal.  Abdominal: Soft. Bowel sounds are normal.  Musculoskeletal: Normal range of motion.  Arms are atraumatic, no acute deformities, moving them freely during exam without issue, no overlying skin changes  Neurological: She is alert and oriented to person, place, and time.  Skin: Skin is warm and dry.  Psychiatric:  Odd affect  Nursing note and vitals reviewed.    ED Treatments / Results  Labs (all labs ordered are listed, but only abnormal results are displayed) Labs Reviewed - No data to display  EKG  EKG Interpretation None       Radiology Dg Forearm Right  Result Date: 03/13/2017 CLINICAL DATA:  Pain following fall EXAM: RIGHT FOREARM - 2 VIEW COMPARISON:  None. FINDINGS: Frontal and lateral views were obtained. There is no appreciable fracture or dislocation. Joint spaces appear normal. No elbow joint effusion. IMPRESSION: No fracture or dislocation.  No evident arthropathy. Electronically Signed   By: Bretta BangWilliam  Woodruff III M.D.   On: 03/13/2017 00:46   Dg Chest Portable 1 View  Result Date: 03/11/2017 CLINICAL DATA:  Pain.  Patient reports hemoptysis. EXAM: PORTABLE CHEST 1 VIEW COMPARISON:  Frontal and lateral views 04/18/2016 FINDINGS: The cardiomediastinal contours are normal. The  lungs are clear. Pulmonary vasculature is normal. No consolidation, pleural effusion, or pneumothorax. No acute osseous abnormalities are seen. Remote right clavicle fracture. IMPRESSION: No acute pulmonary process. Electronically Signed   By: Rubye OaksMelanie  Ehinger M.D.   On: 03/11/2017 03:16   Dg Humerus Right  Result Date: 03/13/2017 CLINICAL DATA:  Pain following fall EXAM: RIGHT HUMERUS - 2+ VIEW COMPARISON:  None. FINDINGS: Frontal and lateral views were obtained. No acute fracture or dislocation. There is evidence of an old fracture of the right clavicle. A small spur arises from the lateral  right humeral head, likely secondary to chronic arthropathy. There is mild narrowing of the glenohumeral joint. No erosive change. IMPRESSION: No acute fracture or dislocation. Old healed fracture right clavicle. 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 Electronically Signed   By: Bretta BangWilliam  Woodruff III M.D.   On: 03/13/2017 00:47    Procedures Procedures (including critical care time)  Medications Ordered in ED Medications - No data to display   Initial Impression / Assessment and Plan / ED Course  I have reviewed the triage vital signs and the nursing notes.  Pertinent labs & imaging results that were available during my care of the patient were reviewed by me and considered in my medical decision making (see chart for details).  56 year old female here with arm pain.  When questioned about this she denies pain and states that her arm is "fine".  Her only complaint is wanting a cup of coffee.  She did have x-rays done in triage which are negative.  I do not appreciate any deformities or overlying skin changes on exam.  Patient does have an odd affect and is fidgeting throughout exam, however does not appear acutely psychotic at present.  She does not appear to be a danger to herself or others at this time.  Feel she is appropriate for discharge.  We will have her follow-up with primary care.  Discussed plan with patient, he/she acknowledged understanding and agreed with plan of care.  Return precautions given for new or worsening symptoms.  Final Clinical Impressions(s) / ED Diagnoses   Final diagnoses:  Encounter for medical screening examination    ED Discharge Orders    None       Garlon HatchetSanders, Kruze Atchley M, PA-C 03/13/17 0144    Gilda CreasePollina, Christopher J, MD 03/14/17 [phone]

## 2017-03-13 NOTE — ED Triage Notes (Signed)
Pt states she has right arm pain and she wants to have x ray of her arm.

## 2017-05-22 ENCOUNTER — Other Ambulatory Visit: Payer: Self-pay

## 2017-05-22 ENCOUNTER — Encounter (HOSPITAL_COMMUNITY): Payer: Self-pay | Admitting: Emergency Medicine

## 2017-05-22 DIAGNOSIS — N3000 Acute cystitis without hematuria: Secondary | ICD-10-CM | POA: Insufficient documentation

## 2017-05-22 DIAGNOSIS — F17210 Nicotine dependence, cigarettes, uncomplicated: Secondary | ICD-10-CM | POA: Insufficient documentation
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
## 2017-05-23 ENCOUNTER — Emergency Department (HOSPITAL_COMMUNITY): Payer: Self-pay

## 2017-05-23 ENCOUNTER — Emergency Department (HOSPITAL_COMMUNITY)
Admission: EM | Admit: 2017-05-23 | Discharge: 2017-05-23 | Disposition: A | Discharge status: Home / Self Care | Payer: Self-pay | Attending: Emergency Medicine | Admitting: Emergency Medicine

## 2017-05-23 DIAGNOSIS — N3000 Acute cystitis without hematuria: Secondary | ICD-10-CM

## 2017-05-23 LAB — COMPREHENSIVE METABOLIC PANEL
ALK PHOS: 53 U/L (ref 38–126)
ALT: 14 U/L (ref 14–54)
ANION GAP: 11 (ref 5–15)
AST: 21 U/L (ref 15–41)
Albumin: 4.4 g/dL (ref 3.5–5.0)
BILIRUBIN TOTAL: 1 mg/dL (ref 0.3–1.2)
BUN: 14 mg/dL (ref 6–20)
CO2: 24 mmol/L (ref 22–32)
Calcium: 9.6 mg/dL (ref 8.9–10.3)
Chloride: 103 mmol/L (ref 101–111)
Creatinine, Ser: 0.91 mg/dL (ref 0.44–1.00)
GFR calc non Af Amer: >60 mL/min (ref >60)
GLUCOSE: 130 mg/dL — AB (ref 65–99)
Potassium: 3.9 mmol/L (ref 3.5–5.1)
Sodium: 138 mmol/L (ref 135–145)
Total Protein: 7.2 g/dL (ref 6.5–8.1)

## 2017-05-23 LAB — URINALYSIS, ROUTINE W REFLEX MICROSCOPIC
BACTERIA UA: NONE SEEN
BILIRUBIN URINE: NEGATIVE
Glucose, UA: NEGATIVE mg/dL
Hgb urine dipstick: NEGATIVE
KETONES UR: NEGATIVE mg/dL
Nitrite: NEGATIVE
PH: 5 (ref 5.0–8.0)
Protein, ur: NEGATIVE mg/dL
Specific Gravity, Urine: 1.019 (ref 1.005–1.030)

## 2017-05-23 LAB — CBC WITH DIFFERENTIAL/PLATELET
Basophils Absolute: 0 10*3/uL (ref 0.0–0.1)
Basophils Relative: 0 %
EOS ABS: 0.3 10*3/uL (ref 0.0–0.7)
Eosinophils Relative: 4 %
HEMATOCRIT: 33.7 % — AB (ref 36.0–46.0)
HEMOGLOBIN: 11 g/dL — AB (ref 12.0–15.0)
LYMPHS ABS: 2.7 10*3/uL (ref 0.7–4.0)
LYMPHS PCT: 40 %
MCH: 29.6 pg (ref 26.0–34.0)
MCHC: 32.6 g/dL (ref 30.0–36.0)
MCV: 90.8 fL (ref 78.0–100.0)
MONOS PCT: 6 %
Monocytes Absolute: 0.4 10*3/uL (ref 0.1–1.0)
NEUTROS PCT: 50 %
Neutro Abs: 3.3 10*3/uL (ref 1.7–7.7)
Platelets: 263 10*3/uL (ref 150–400)
RBC: 3.71 MIL/uL — ABNORMAL LOW (ref 3.87–5.11)
RDW: 13.4 % (ref 11.5–15.5)
WBC: 6.7 10*3/uL (ref 4.0–10.5)

## 2017-05-23 MED ORDER — CEPHALEXIN 250 MG PO CAPS
500.0000 mg | ORAL_CAPSULE | Freq: Once | ORAL | Status: AC
  Administered 2017-05-23: 500 mg via ORAL
  Filled 2017-05-23: qty 2

## 2017-05-23 MED ORDER — CEPHALEXIN 500 MG PO CAPS: 500.0000 mg | ORAL_CAPSULE | Freq: Two times a day (BID) | ORAL | 0 refills | Status: DC

## 2017-05-23 NOTE — Discharge Instructions (Signed)
To find a primary care or specialty doctor please call [phone] or [phone] to access "[place] Find a Doctor Service." ° °You may also go on the [place] website at [email or web address] ° °There are also multiple [facility], [facility] and [facility] practices throughout the [place] that are frequently accepting new patients. You may find a clinic that is close to your home and contact them. ° °[facility] -  °[address] °[place] [address] °[phone] ° ° °[facility] -  °[address] °[place] [address] [phone] ° ° °[facility] - °[address]  °[place] [address] °[phone] ° ° °

## 2017-05-23 NOTE — ED Provider Notes (Signed)
TIME SEEN: 4:46 AM  CHIEF COMPLAINT: "I am not feeling well"  HPI: Patient is a 57 year old female with history of schizoaffective disorder who presents to the emergency department stating that she is not feeling well.  States she has had cough.  Reported to nurse that she was coughing up blood but denies this to me.  No chest pain or shortness of breath.  Has had some lower abdominal pain and dysuria.  Reports vomiting today.  No diarrhea.  Patient is a poor historian.  States she is feeling better now that she has been able to rest in the emergency department.  ROS: See HPI Constitutional: no fever  Eyes: no drainage  ENT: no runny nose   Cardiovascular:  no chest pain  Resp: no SOB  GI:  vomiting GU:  dysuria Integumentary: no rash  Allergy: no hives  Musculoskeletal: no leg swelling  Neurological: no slurred speech ROS otherwise negative  PAST MEDICAL HISTORY/PAST SURGICAL HISTORY:  History reviewed. No pertinent past medical history.  MEDICATIONS:  Prior to Admission medications   Not on File    ALLERGIES:  Allergies  Allergen Reactions  . Other     PT reports having environmental allergies which she used to receive shots for, but cannot specify exact allergens    SOCIAL HISTORY:  Social History   Tobacco Use  . Smoking status: Current Some Day Smoker    Packs/day: 1.00    Types: Cigarettes  . Smokeless tobacco: Never Used  Substance Use Topics  . Alcohol use: No    FAMILY HISTORY: Family History  Family history unknown: Yes    EXAM: BP 123/70 (BP Location: Right Arm)   Pulse 80   Temp 98.5 F (36.9 C) (Oral)   Resp 16   Ht 5\' 6"  (1.676 m)   Wt 56.7 kg (125 lb)   SpO2 98%   BMI 20.18 kg/m  CONSTITUTIONAL: Alert and oriented and responds appropriately to questions.  Thin, chronically ill-appearing, afebrile, nontoxic HEAD: Normocephalic EYES: Conjunctivae clear, pupils appear equal, EOMI ENT: normal nose; moist mucous membranes NECK: Supple, no  meningismus, no nuchal rigidity, no LAD  CARD: RRR; S1 and S2 appreciated; no murmurs, no clicks, no rubs, no gallops RESP: Normal chest excursion without splinting or tachypnea; breath sounds clear and equal bilaterally; no wheezes, no rhonchi, no rales, no hypoxia or respiratory distress, speaking full sentences ABD/GI: Normal bowel sounds; non-distended; soft, non-tender, no rebound, no guarding, no peritoneal signs, no hepatosplenomegaly BACK:  The back appears normal and is non-tender to palpation, there is no CVA tenderness EXT: Normal ROM in all joints; non-tender to palpation; no edema; normal capillary refill; no cyanosis, no calf tenderness or swelling    SKIN: Normal color for age and race; warm; no rash NEURO: Moves all extremities equally PSYCH: Bizarre affect but no signs of active psychosis.  MEDICAL DECISION MAKING: Patient here with complaints of not feeling well.  States she is now feeling better.  She is hemodynamically stable.  Urine appears to be infected with too numerous to count white blood cells.  Will send urine culture and start her on antibiotics.  Does report cough but lungs are currently clear no hypoxia.  Will obtain chest x-ray.  Labs are unremarkable.  No leukocytosis, significant anemia, electrolyte abnormality.  ED PROGRESS: Patient's chest x-ray shows no acute abnormality.  Will discharge home on Keflex.  Urine culture is pending.   At this time, I do not feel there is any life-threatening condition present.  I have reviewed and discussed all results (EKG, imaging, lab, urine as appropriate) and exam findings with patient/family. I have reviewed nursing notes and appropriate previous records.  I feel the patient is safe to be discharged home without further emergent workup and can continue workup as an outpatient as needed. Discussed usual and customary return precautions. Patient/family verbalize understanding and are comfortable with this plan.  Outpatient  follow-up has been provided if needed. All questions have been answered.      Dominik Lauricella, Layla Maw, DO 05/23/17 [phone]

## 2017-05-23 NOTE — ED Notes (Signed)
Pt called multiple times. No answer. Charge RN made aware.

## 2017-05-23 NOTE — ED Notes (Signed)
Pt states she feels better and wants to go home.

## 2017-05-24 ENCOUNTER — Encounter (HOSPITAL_COMMUNITY): Payer: Self-pay | Admitting: Emergency Medicine

## 2017-05-24 DIAGNOSIS — Z79899 Other long term (current) drug therapy: Secondary | ICD-10-CM | POA: Insufficient documentation

## 2017-05-24 DIAGNOSIS — K219 Gastro-esophageal reflux disease without esophagitis: Secondary | ICD-10-CM | POA: Insufficient documentation

## 2017-05-24 DIAGNOSIS — F17210 Nicotine dependence, cigarettes, uncomplicated: Secondary | ICD-10-CM | POA: Insufficient documentation

## 2017-05-24 LAB — URINE CULTURE: Culture: NO GROWTH
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
## 2017-05-25 ENCOUNTER — Emergency Department (HOSPITAL_COMMUNITY)
Admission: EM | Admit: 2017-05-25 | Discharge: 2017-05-25 | Disposition: A | Discharge status: Home / Self Care | Payer: Self-pay | Attending: Emergency Medicine | Admitting: Emergency Medicine

## 2017-05-25 ENCOUNTER — Encounter (HOSPITAL_COMMUNITY): Payer: Self-pay | Admitting: *Deleted

## 2017-05-25 DIAGNOSIS — F17210 Nicotine dependence, cigarettes, uncomplicated: Secondary | ICD-10-CM | POA: Insufficient documentation

## 2017-05-25 DIAGNOSIS — K219 Gastro-esophageal reflux disease without esophagitis: Secondary | ICD-10-CM

## 2017-05-25 DIAGNOSIS — M79604 Pain in right leg: Secondary | ICD-10-CM | POA: Insufficient documentation

## 2017-05-25 DIAGNOSIS — G8929 Other chronic pain: Secondary | ICD-10-CM

## 2017-05-25 DIAGNOSIS — Z79899 Other long term (current) drug therapy: Secondary | ICD-10-CM | POA: Insufficient documentation

## 2017-05-25 DIAGNOSIS — M79605 Pain in left leg: Secondary | ICD-10-CM | POA: Insufficient documentation

## 2017-05-25 LAB — RAPID URINE DRUG SCREEN, HOSP PERFORMED
AMPHETAMINES: NOT DETECTED
BENZODIAZEPINES: NOT DETECTED
Barbiturates: NOT DETECTED
COCAINE: NOT DETECTED
Opiates: NOT DETECTED
Tetrahydrocannabinol: NOT DETECTED

## 2017-05-25 LAB — COMPREHENSIVE METABOLIC PANEL
ALT: 17 U/L (ref 14–54)
ANION GAP: 7 (ref 5–15)
AST: 27 U/L (ref 15–41)
Albumin: 4.9 g/dL (ref 3.5–5.0)
Alkaline Phosphatase: 66 U/L (ref 38–126)
BUN: 19 mg/dL (ref 6–20)
CHLORIDE: 105 mmol/L (ref 101–111)
CO2: 27 mmol/L (ref 22–32)
Calcium: 9.5 mg/dL (ref 8.9–10.3)
Creatinine, Ser: 0.91 mg/dL (ref 0.44–1.00)
Glucose, Bld: 114 mg/dL — ABNORMAL HIGH (ref 65–99)
POTASSIUM: 3.4 mmol/L — AB (ref 3.5–5.1)
Sodium: 139 mmol/L (ref 135–145)
Total Bilirubin: 0.8 mg/dL (ref 0.3–1.2)
Total Protein: 8.5 g/dL — ABNORMAL HIGH (ref 6.5–8.1)

## 2017-05-25 LAB — CBC
HCT: 34.9 % — ABNORMAL LOW (ref 36.0–46.0)
Hemoglobin: 11.4 g/dL — ABNORMAL LOW (ref 12.0–15.0)
MCH: 29.7 pg (ref 26.0–34.0)
MCHC: 32.7 g/dL (ref 30.0–36.0)
MCV: 90.9 fL (ref 78.0–100.0)
PLATELETS: 273 10*3/uL (ref 150–400)
RBC: 3.84 MIL/uL — AB (ref 3.87–5.11)
RDW: 13.7 % (ref 11.5–15.5)
WBC: 6.2 10*3/uL (ref 4.0–10.5)

## 2017-05-25 LAB — ETHANOL

## 2017-05-25 LAB — SALICYLATE LEVEL

## 2017-05-25 LAB — ACETAMINOPHEN LEVEL

## 2017-05-25 MED ORDER — RANITIDINE HCL 150 MG/10ML PO SYRP
150.0000 mg | ORAL_SOLUTION | Freq: Once | ORAL | Status: AC
  Administered 2017-05-25: 150 mg via ORAL
  Filled 2017-05-25 (×2): qty 10

## 2017-05-25 MED ORDER — RANITIDINE HCL 150 MG/10ML PO SYRP
150.0000 mg | ORAL_SOLUTION | Freq: Once | ORAL | Status: AC
Start: 2017-05-25 — End: 2017-05-25
  Administered 2017-05-25: 150 mg via ORAL
  Filled 2017-05-25: qty 10

## 2017-05-25 MED ORDER — RANITIDINE HCL 150 MG PO CAPS: 150.0000 mg | ORAL_CAPSULE | Freq: Every day | ORAL | 0 refills | Status: DC

## 2017-05-25 NOTE — ED Triage Notes (Signed)
Pt complains of bilateral leg pain. Pt was sleeping in the lobby after being discharged this morning and states she woke up with pain in her legs and would like something for pain.

## 2017-05-25 NOTE — ED Notes (Signed)
Pt given peanut butter, graham crackers, ginger ale, and apple juice.

## 2017-05-25 NOTE — Discharge Instructions (Signed)
Take your usual home medications as directed. Zantac can be purchased at [facility] for $4 over the counter. Use tylenol for pain, use ice and heat as well. Follow up with your regular doctor and your chiropractor in 1 week for recheck of symptoms and ongoing management of your chronic issues. Return to the ER for emergent changes or worsening symptoms.

## 2017-05-25 NOTE — ED Provider Notes (Addendum)
[facility]-EMERGENCY DEPT Provider Note   CSN: [id number] Arrival date & time: 05/25/17  1138     History   Chief Complaint Chief Complaint  Patient presents with  . Leg Pain    HPI Betty Henderson is a 57 y.o. female with a PMHx of GERD, chronic pain, and schizoaffective disorder, who presents to the ED requesting a dose of Zantac as well as lunch and a bus pass.  Patient was here last night and given a Zantac prescription, she states that she needs to be able to get to her son in order to be able to afford her prescription.  She wanted a dose before she left and then she wanted to get a bus pass so she could go get to him.  She initially reported to the triage nurse that she had bilateral leg pain, but during evaluation she states that this is a chronic issue and that she sees a chiropractor who takes care of her pain in her legs.  She denies that this is why she is here today, and denies any acute changes in her bilateral leg pain.  She denies any fevers, numbness, tingling, focal weakness, leg swelling, abdominal pain, or any other complaints at this time.  She is very tangential and a poor historian, but chart review reveals that she's been to multiple ERs for similar complaints and usually just wants food and transportation.    The history is provided by the patient and medical records. No language interpreter was used.    History reviewed. No pertinent past medical history.  There are no active problems to display for this patient.   Past Surgical History:  Procedure Laterality Date  . MOUTH SURGERY    . TUBAL LIGATION      OB History    No data available       Home Medications    Prior to Admission medications   Medication Sig Start Date End Date Taking? Authorizing Provider  cephALEXin (KEFLEX) 500 MG capsule Take 1 capsule (500 mg total) by mouth 2 (two) times daily. 05/23/17   Ward, Layla Maw, DO  ranitidine (ZANTAC) 150 MG capsule Take 1  capsule (150 mg total) by mouth daily. 05/25/17   Antony Madura, PA-C    Family History Family History  Family history unknown: Yes    Social History Social History   Tobacco Use  . Smoking status: Current Some Day Smoker    Packs/day: 1.00    Types: Cigarettes  . Smokeless tobacco: Never Used  Substance Use Topics  . Alcohol use: No  . Drug use: No     Allergies   Other   Review of Systems Review of Systems  Constitutional: Negative for chills and fever.  Gastrointestinal: Negative for abdominal pain.  Musculoskeletal: Positive for myalgias (chronic b/l leg pain). Negative for arthralgias and joint swelling.  Allergic/Immunologic: Negative for immunocompromised state.  Neurological: Negative for weakness and numbness.    Physical Exam Updated Vital Signs BP 128/83 (BP Location: Right Arm)   Pulse 67   Temp 97.6 F (36.4 C) (Oral)   Resp 18   SpO2 100%   Physical Exam  Constitutional: She is oriented to person, place, and time. Vital signs are normal. She appears well-developed and well-nourished.  Non-toxic appearance. No distress.  Afebrile, nontoxic, NAD  HENT:  Head: Normocephalic and atraumatic.  Mouth/Throat: Oropharynx is clear and moist and mucous membranes are normal.  Eyes: Conjunctivae and EOM are normal. Right eye  exhibits no discharge. Left eye exhibits no discharge.  Neck: Normal range of motion. Neck supple.  Cardiovascular: Normal rate, regular rhythm, normal heart sounds and intact distal pulses. Exam reveals no gallop and no friction rub.  No murmur heard. Pulmonary/Chest: Effort normal and breath sounds normal. No respiratory distress. She has no decreased breath sounds. She has no wheezes. She has no rhonchi. She has no rales.  Abdominal: Soft. Normal appearance and bowel sounds are normal. She exhibits no distension. There is no tenderness. There is no rigidity, no rebound, no guarding, no CVA tenderness, no tenderness at McBurney's point and  negative Murphy's sign.  Soft, NTND, +BS throughout, no r/g/r, neg murphy's, neg mcburney's, no CVA TTP   Musculoskeletal: Normal range of motion.  MAE x4 Strength and sensation grossly intact in all extremities Distal pulses intact Gait steady No pedal edema No focal area of tenderness to either leg. No joint swelling or effusions in all major joints, no crepitus or deformity, FROM intact in all major joints.   Neurological: She is alert and oriented to person, place, and time. She has normal strength. No sensory deficit.  Skin: Skin is warm, dry and intact. No rash noted.  Psychiatric: She has a normal mood and affect. Her speech is tangential.  Tangential but easily redirected  Nursing note and vitals reviewed.    ED Treatments / Results  Labs (all labs ordered are listed, but only abnormal results are displayed) Labs Reviewed - No data to display  EKG  EKG Interpretation None       Radiology No results found.  Procedures Procedures (including critical care time)  Medications Ordered in ED Medications  ranitidine (ZANTAC) 150 MG/10ML syrup 150 mg (150 mg Oral Given 05/25/17 1418)     Initial Impression / Assessment and Plan / ED Course  I have reviewed the triage vital signs and the nursing notes.  Pertinent labs & imaging results that were available during my care of the patient were reviewed by me and considered in my medical decision making (see chart for details).     57 y.o. female here for a zantac dose because she states she can't get to the store to get the zantac she was prescribed last night. Requests to get a dose here, then get lunch and a bus pass so she can get to her son. Initially she reports chronic b/l leg pain, however she states that's not why she's here, and that's a chronic issue. On exam, no abdominal tenderness, no focal leg tenderness, NVI with soft compartments, no swelling, ambulatory without difficulty. Will give her dose of zantac, advised  getting [facility] at [facility] for $4, and advised f/up with her PCP and her chiropractor for ongoing management of her chronic issues. Doubt need for further emergent work up at this time. I explained the diagnosis and have given explicit precautions to return to the ER including for any other new or worsening symptoms. The patient understands and accepts the medical plan as it's been dictated and I have answered their questions. Discharge instructions concerning home care and prescriptions have been given. The patient is STABLE and is discharged to home in good condition.    Final Clinical Impressions(s) / ED Diagnoses   Final diagnoses:  Medication management  Chronic pain of both lower extremities    ED Discharge Orders    [address]       Sheba Whaling, FriedenswaldMercedes, [place] JerseyPA-C 05/25/17 1418    Maia PlanLong, Joshua G, MD 05/25/17 2016

## 2017-05-25 NOTE — ED Notes (Signed)
Bed: ZO10WA31 Expected date:  Expected time:  Means of arrival:  Comments: Hold for Triage 5

## 2017-05-25 NOTE — ED Notes (Signed)
Bed: WTR5 Expected date:  Expected time:  Means of arrival:  Comments: 

## 2017-05-25 NOTE — ED Notes (Signed)
Pt stated "I'm here because I need a prescription for 90 days of my Zantac."  Pt denies SI/HI.

## 2017-05-25 NOTE — ED Notes (Signed)
Pt refused to sign.  

## 2017-05-25 NOTE — ED Provider Notes (Signed)
[facility]-EMERGENC[facility] DEPT Provider Note   CSN: [id number] Arrival date & time: 05/24/17  2308     History   Chief Complaint Chief Complaint  Patient presents with  . Psychiatric Evaluation    HPI Betty ChromanDebra Ann Henderson is a 57 y.o. female.  57 year old female presents to the emergency department for evaluation.  During my encounter with her she states that she came to the emergency department because her stomach and back were hurting because she has not been taking her Zantac.  He is requesting a dose of her medication in the emergency department.  Triage note references need for psychiatric medications; however, patient denies this being the cause for her presentation.  She denies any suicidal or homicidal ideation.  She further denies alcohol or illicit drug use.   The history is provided by the patient. No language interpreter was used.    History reviewed. No pertinent past medical history.  There are no active problems to display for this patient.   Past Surgical History:  Procedure Laterality Date  . MOUTH SURGERY    . TUBAL LIGATION      OB History    No data available       Home Medications    Prior to Admission medications   Medication Sig Start Date End Date Taking? Authorizing Provider  cephALEXin (KEFLEX) 500 MG capsule Take 1 capsule (500 mg total) by mouth 2 (two) times daily. 05/23/17  Yes Ward, Layla MawKristen N, DO  ranitidine (ZANTAC) 150 MG capsule Take 1 capsule (150 mg total) by mouth daily. 05/25/17   Antony MaduraHumes, Rock Sobol, PA-C    Family History Family History  Family history unknown: Yes    Social History Social History   Tobacco Use  . Smoking status: Current Some Day Smoker    Packs/day: 1.00    Types: Cigarettes  . Smokeless tobacco: Never Used  Substance Use Topics  . Alcohol use: No  . Drug use: No     Allergies   Other   Review of Systems Review of Systems Ten systems reviewed and are negative for acute change, except  as noted in the HPI.    Physical Exam Updated Vital Signs BP (!) 86/60 (BP Location: Left Arm)   Pulse 60   Temp 97.9 F (36.6 C) (Oral)   Resp 16   SpO2 100%   Physical Exam  Constitutional: She is oriented to person, place, and time. She appears well-developed and well-nourished. No distress.  Patient in NAD  HENT:  Head: Normocephalic and atraumatic.  Eyes: Conjunctivae and EOM are normal. No scleral icterus.  Neck: Normal range of motion.  Pulmonary/Chest: Effort normal. No stridor. No respiratory distress.  Respirations even and unlabored  Musculoskeletal: Normal range of motion.  Neurological: She is alert and oriented to person, place, and time. She exhibits normal muscle tone. Coordination normal.  Skin: Skin is warm and dry. No rash noted. She is not diaphoretic. No erythema. No pallor.  Psychiatric: She has a normal mood and affect. Her behavior is normal. Her speech is tangential.  Nursing note and vitals reviewed.    ED Treatments / Results  Labs (all labs ordered are listed, but only abnormal results are displayed) Labs Reviewed  COMPREHENSIVE METABOLIC PANEL - Abnormal; Notable for the following components:      Result Value   Potassium 3.4 (*)    Glucose, Bld 114 (*)    Total Protein 8.5 (*)    All other components within normal limits  ACETAMINOPHEN LEVEL - Abnormal; Notable for the following components:   Acetaminophen (Tylenol), Serum <10 (*)    All other components within normal limits  CBC - Abnormal; Notable for the following components:   RBC 3.84 (*)    Hemoglobin 11.4 (*)    HCT 34.9 (*)    All other components within normal limits  ETHANOL  SALICYLATE LEVEL  RAPID URINE DRUG SCREEN, HOSP PERFORMED  GC/CHLAMYDIA PROBE AMP ([place]) NOT AT [facility]    EKG  EKG Interpretation None       Radiology Dg Chest 2 View  Result Date: 05/23/2017 CLINICAL DATA:  57 y/o  F; hemoptysis. EXAM: CHEST  2 VIEW COMPARISON:  03/11/2017 chest  radiograph FINDINGS: Stable heart size and mediastinal contours are within normal limits. Both lungs are clear. The visualized skeletal structures are unremarkable. IMPRESSION: No acute pulmonary process identified. Electronically Signed   By: Mitzi Hansen M.D.   On: 05/23/2017 05:39    Procedures Procedures (including critical care time)  Medications Ordered in ED Medications  ranitidine (ZANTAC) 150 MG/10ML syrup 150 mg (150 mg Oral Given 05/25/17 0324)     Initial Impression / Assessment and Plan / ED Course  I have reviewed the triage vital signs and the nursing notes.  Pertinent labs & imaging results that were available during my care of the patient were reviewed by me and considered in my medical decision making (see chart for details).     Patient presenting for dosing of Zantac.  She states that she has not had her Zantac in the last few weeks and this is causing her stomach and back to hurt.  Speech is otherwise tangential.  There is underlying history of schizoaffective disorder.  No SI/HI today.  Patient denies ETOH or drug use.  She was given a dose of her Zantac in the ED.  Labs are otherwise stable.  I do not see indication for further emergent workup at this time.  Patient stable for discharge.  Return precautions provided.   Final Clinical Impressions(s) / ED Diagnoses   Final diagnoses:  Gastroesophageal reflux disease, esophagitis presence not specified    ED Discharge Orders        Ordered    ranitidine (ZANTAC) 150 MG capsule  Daily     05/25/17 0259       Antony Madura, PA-C 05/25/17 0335    Gerhard Munch, MD 05/25/17 0530

## 2017-05-25 NOTE — ED Notes (Signed)
Pt has her belongings (clothes, bible, credit card) in a white pt belongings bag placed near the nurse's station in triage.

## 2017-05-29 ENCOUNTER — Emergency Department (HOSPITAL_COMMUNITY)
Admission: EM | Admit: 2017-05-29 | Discharge: 2017-05-29 | Disposition: A | Discharge status: Home / Self Care | Payer: Self-pay | Attending: Emergency Medicine | Admitting: Emergency Medicine

## 2017-05-29 DIAGNOSIS — Z5900 Homelessness unspecified: Secondary | ICD-10-CM

## 2017-05-29 DIAGNOSIS — F17210 Nicotine dependence, cigarettes, uncomplicated: Secondary | ICD-10-CM | POA: Insufficient documentation

## 2017-05-29 NOTE — ED Notes (Signed)
Bed: ZO10WA30 Expected date:  Expected time:  Means of arrival:  Comments: housekeeping
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
## 2017-05-29 NOTE — ED Notes (Signed)
Pt A&O x 3, no distress noted, irritable at present.  [employer] at bedside for assistance.  Monitoring for safety, Q 15 min checks in effect.

## 2017-05-29 NOTE — ED Triage Notes (Addendum)
[place] she exhibited confusion and agitation to EMS personnel.  Patient thought her arm and leg were broken but upon palpation patient had no deformities, bruising, swelling, or pain.  Patient also is paranoid thinking people are robbing her.  History of bipolar disorder and patient reports she has not had the money to buy her medications.  Patient was discharged from this ED earlier today.

## 2017-05-30 ENCOUNTER — Encounter (HOSPITAL_COMMUNITY): Payer: Self-pay | Admitting: Emergency Medicine

## 2017-05-30 ENCOUNTER — Emergency Department (HOSPITAL_COMMUNITY)
Admission: EM | Admit: 2017-05-30 | Discharge: 2017-05-30 | Disposition: A | Discharge status: Home / Self Care | Payer: Self-pay | Attending: Emergency Medicine | Admitting: Emergency Medicine

## 2017-05-30 ENCOUNTER — Other Ambulatory Visit: Payer: Self-pay

## 2017-05-30 ENCOUNTER — Emergency Department (HOSPITAL_COMMUNITY): Admission: EM | Admit: 2017-05-30 | Discharge: 2017-05-30 | Discharge status: Against Medical Advice | Payer: Self-pay

## 2017-05-30 DIAGNOSIS — R6889 Other general symptoms and signs: Secondary | ICD-10-CM

## 2017-05-30 DIAGNOSIS — F17210 Nicotine dependence, cigarettes, uncomplicated: Secondary | ICD-10-CM | POA: Insufficient documentation

## 2017-05-30 DIAGNOSIS — F209 Schizophrenia, unspecified: Secondary | ICD-10-CM | POA: Insufficient documentation

## 2017-05-30 DIAGNOSIS — Z765 Malingerer [conscious simulation]: Secondary | ICD-10-CM | POA: Insufficient documentation

## 2017-05-30 HISTORY — DX: Unspecified osteoarthritis, unspecified site: M19.90

## 2017-05-30 HISTORY — DX: Anemia, unspecified: D64.9

## 2017-05-30 HISTORY — DX: Unspecified psychosis not due to a substance or known physiological condition: F29

## 2017-05-30 HISTORY — DX: Schizoaffective disorder, bipolar type: F25.0

## 2017-05-30 NOTE — ED Notes (Signed)
Pt is resting currently, easily aroused, fully CAOx4.

## 2017-05-30 NOTE — ED Provider Notes (Signed)
[facility] Provider Note   CSN: [id number] Arrival date & time: 05/30/17  0446     History   Chief Complaint Chief Complaint  Patient presents with  . Head Injury    HPI Betty Henderson is a 57 y.o. female.  57 y/o female with hx of schizoaffective d/o presents for evaluation after sleeping for many hours in the waiting room. She is homeless. She states that she decided to check in when someone awoke her from sleeping. She is requesting Zantac, but with no specific complaints. She does not head injury, but states this was 5 months ago; patient unclear on mechanism. The patient has been noncompliant with her daily medications as she cannot afford them. [place] to go to [place]. She denies chest pain, SOB, SI and HI.   The history is provided by the patient. No language interpreter was used.  Head Injury      Past Medical History:  Diagnosis Date  . Anemia   . Arthritis   . Psychosis (HCC)   . Schizoaffective disorder, bipolar type (HCC)     There are no active problems to display for this patient.   Past Surgical History:  Procedure Laterality Date  . MOUTH SURGERY    . TUBAL LIGATION      OB History    No data available       Home Medications    Prior to Admission medications   Medication Sig Start Date End Date Taking? Authorizing Provider  ranitidine (ZANTAC) 150 MG capsule Take 1 capsule (150 mg total) by mouth daily. 05/25/17  Yes Antony MaduraHumes, Adrine Hayworth, PA-C  cephALEXin (KEFLEX) 500 MG capsule Take 1 capsule (500 mg total) by mouth 2 (two) times daily. Patient not taking: Reported on 05/29/2017 05/23/17   Ward, Layla MawKristen N, DO    Family History Family History  Family history unknown: Yes    Social History Social History   Tobacco Use  . Smoking status: Current Some Day Smoker    Packs/day: 1.00    Types: Cigarettes  . Smokeless tobacco: Never Used  Substance Use Topics  . Alcohol use: No  . Drug use: No      Allergies   Other   Review of Systems Review of Systems Ten systems reviewed and are negative for acute change, except as noted in the HPI.    Physical Exam Updated Vital Signs BP 138/75 (BP Location: Left Arm)   Pulse 75   Temp 98.6 F (37 C) (Oral)   Resp 20   SpO2 100%   Physical Exam  Constitutional: She is oriented to person, place, and time. She appears well-developed and well-nourished. No distress.  Nontoxic appearing and in NAD  HENT:  Head: Normocephalic and atraumatic.  Eyes: Conjunctivae and EOM are normal. No scleral icterus.  Neck: Normal range of motion.  Cardiovascular: Normal rate, regular rhythm and intact distal pulses.  Pulmonary/Chest: Effort normal. No stridor. No respiratory distress. She has no wheezes.  Lungs CTAB. Respirations even and unlabored.  Musculoskeletal: Normal range of motion.  Neurological: She is alert and oriented to person, place, and time. She exhibits normal muscle tone. Coordination normal.  GCS 15. Patient moving all extremities.  Skin: Skin is warm and dry. No rash noted. She is not diaphoretic. No erythema. No pallor.  Psychiatric: She has a normal mood and affect. Her speech is tangential. She is hyperactive. She expresses no homicidal and no suicidal ideation.  Nursing note and vitals reviewed.  ED Treatments / Results  Labs (all labs ordered are listed, but only abnormal results are displayed) Labs Reviewed - No data to display  EKG  EKG Interpretation None       Radiology No results found.  Procedures Procedures (including critical care time)  Medications Ordered in ED Medications - No data to display   Initial Impression / Assessment and Plan / ED Course  I have reviewed the triage vital signs and the nursing notes.  Pertinent labs & imaging results that were available during my care of the patient were reviewed by me and considered in my medical decision making (see chart for details).      Patient with manic behavior and hx of schizoaffective disorder. She has not specific complaints and states she only checked in when someone awoke her from sleeping while in the waiting room. She denies SI/HI. She answers questions appropriately. No current c/o pain. Speech is tangential with flight of ideas and mild hyperactivity, but patient does not appear to be a harm to herself or others.  She is homeless and has been presenting to this emergency department more frequently as of late.  I do not see indication for emergent psychiatric evaluation or additional workup.  Resource guide provided on shelters in the area as well as referral to [employer] and [employer].  Patient discharged in stable condition with no unaddressed concerns.   Final Clinical Impressions(s) / ED Diagnoses   Final diagnoses:  Multiple complaints    ED Discharge Orders    None       Antony Madura, PA-C 05/30/17 0600    Molpus, Jonny Ruiz, MD 05/30/17 [phone]
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
## 2017-05-30 NOTE — ED Notes (Signed)
Called patient x 3 . Searched the bathroom and outsie the lobby. Patient was not found.

## 2017-05-31 ENCOUNTER — Emergency Department (HOSPITAL_COMMUNITY)
Admission: EM | Admit: 2017-05-31 | Discharge: 2017-05-31 | Disposition: A | Discharge status: Home / Self Care | Payer: Self-pay | Attending: Emergency Medicine | Admitting: Emergency Medicine

## 2017-05-31 ENCOUNTER — Other Ambulatory Visit: Payer: Self-pay

## 2017-05-31 ENCOUNTER — Encounter (HOSPITAL_COMMUNITY): Payer: Self-pay

## 2017-05-31 DIAGNOSIS — F17210 Nicotine dependence, cigarettes, uncomplicated: Secondary | ICD-10-CM | POA: Insufficient documentation

## 2017-05-31 DIAGNOSIS — R21 Rash and other nonspecific skin eruption: Secondary | ICD-10-CM | POA: Insufficient documentation

## 2017-05-31 MED ORDER — HYDROCORTISONE 1 % EX CREA: TOPICAL_CREAM | CUTANEOUS | 0 refills | Status: DC

## 2017-05-31 MED ORDER — RANITIDINE HCL 150 MG PO CAPS: 150.0000 mg | ORAL_CAPSULE | Freq: Every day | ORAL | 0 refills | Status: DC

## 2017-05-31 NOTE — ED Triage Notes (Signed)
Pt reports a rash on her L shoulder. She also states that she needs a refill of Zantac. Seen previously today. Ambulatory.

## 2017-05-31 NOTE — ED Provider Notes (Signed)
[facility]-EMERGENCY DEPT Provider Note   CSN: [id number] Arrival date & time: 05/31/17  0005     History   Chief Complaint Chief Complaint  Patient presents with  . Rash    HPI Betty Henderson is a 57 y.o. female.  56 year old female who presents with rash to her left shoulder times several weeks.  Has been taking Zantac for this in the past but has since run out.  Denies any fever or chills.  No extension of the rash.  It is pruritic.  It is not vesicular.  Unsure if she has been in contact with something that could be making this worse.  No new treatments used prior to arrival      Past Medical History:  Diagnosis Date  . Anemia   . Arthritis   . Psychosis (HCC)   . Schizoaffective disorder, bipolar type (HCC)     There are no active problems to display for this patient.   Past Surgical History:  Procedure Laterality Date  . MOUTH SURGERY    . TUBAL LIGATION      OB History    No data available       Home Medications    Prior to Admission medications   Medication Sig Start Date End Date Taking? Authorizing Provider  cephALEXin (KEFLEX) 500 MG capsule Take 1 capsule (500 mg total) by mouth 2 (two) times daily. Patient not taking: Reported on 05/29/2017 05/23/17   Ward, Layla Maw, DO  ranitidine (ZANTAC) 150 MG capsule Take 1 capsule (150 mg total) by mouth daily. 05/25/17   Antony Madura, PA-C    Family History Family History  Family history unknown: Yes    Social History Social History   Tobacco Use  . Smoking status: Current Some Day Smoker    Packs/day: 1.00    Types: Cigarettes  . Smokeless tobacco: Never Used  Substance Use Topics  . Alcohol use: No  . Drug use: No     Allergies   Other   Review of Systems Review of Systems  All other systems reviewed and are negative.    Physical Exam Updated Vital Signs BP 130/76 (BP Location: Right Arm)   Pulse 71   Temp (!) 97.4 F (36.3 C) (Oral)   Resp 16   SpO2  100%   Physical Exam  Constitutional: She is oriented to person, place, and time. She appears well-developed and well-nourished.  Non-toxic appearance.  HENT:  Head: Normocephalic and atraumatic.  Eyes: Conjunctivae are normal. Pupils are equal, round, and reactive to light.  Neck: Normal range of motion.  Cardiovascular: Normal rate.  Pulmonary/Chest: Effort normal.  Musculoskeletal:       Arms: Neurological: She is alert and oriented to person, place, and time.  Skin: Skin is warm and dry.  Psychiatric: She has a normal mood and affect.  Nursing note and vitals reviewed.    ED Treatments / Results  Labs (all labs ordered are listed, but only abnormal results are displayed) Labs Reviewed - No data to display  EKG  EKG Interpretation None       Radiology No results found.  Procedures Procedures (including critical care time)  Medications Ordered in ED Medications - No data to display   Initial Impression / Assessment and Plan / ED Course  I have reviewed the triage vital signs and the nursing notes.  Pertinent labs & imaging results that were available during my care of the patient were reviewed by me and  considered in my medical decision making (see chart for details).     Rash consistent with likely contact dermatitis.  Will prescribe steroid cream as well as Zantac.  Final Clinical Impressions(s) / ED Diagnoses   Final diagnoses:  None    ED Discharge Orders    None       Lorre NickAllen, Gevon Markus, MD 05/31/17 0131

## 2017-06-02 ENCOUNTER — Inpatient Hospital Stay (HOSPITAL_COMMUNITY)
Admission: AD | Admit: 2017-06-02 | Discharge: 2017-06-02 | Disposition: A | Discharge status: Home / Self Care | Payer: Self-pay | Source: Ambulatory Visit | Attending: Obstetrics and Gynecology | Admitting: Obstetrics and Gynecology

## 2017-06-02 DIAGNOSIS — B9689 Other specified bacterial agents as the cause of diseases classified elsewhere: Secondary | ICD-10-CM

## 2017-06-02 DIAGNOSIS — N760 Acute vaginitis: Secondary | ICD-10-CM | POA: Insufficient documentation

## 2017-06-02 DIAGNOSIS — R110 Nausea: Secondary | ICD-10-CM

## 2017-06-02 DIAGNOSIS — F250 Schizoaffective disorder, bipolar type: Secondary | ICD-10-CM | POA: Insufficient documentation

## 2017-06-02 DIAGNOSIS — Z590 Homelessness: Secondary | ICD-10-CM | POA: Insufficient documentation

## 2017-06-02 DIAGNOSIS — F17210 Nicotine dependence, cigarettes, uncomplicated: Secondary | ICD-10-CM | POA: Insufficient documentation

## 2017-06-02 LAB — URINALYSIS, ROUTINE W REFLEX MICROSCOPIC
Bacteria, UA: NONE SEEN
Bilirubin Urine: NEGATIVE
GLUCOSE, UA: NEGATIVE mg/dL
HGB URINE DIPSTICK: NEGATIVE
Ketones, ur: NEGATIVE mg/dL
NITRITE: NEGATIVE
PROTEIN: NEGATIVE mg/dL
SPECIFIC GRAVITY, URINE: 1.012 (ref 1.005–1.030)
pH: 6 (ref 5.0–8.0)

## 2017-06-02 LAB — POCT PREGNANCY, URINE: Preg Test, Ur: NEGATIVE

## 2017-06-02 LAB — GC/CHLAMYDIA PROBE AMP (~~LOC~~) NOT AT ARMC
CHLAMYDIA, DNA PROBE: NEGATIVE
NEISSERIA GONORRHEA: NEGATIVE

## 2017-06-02 LAB — WET PREP, GENITAL
SPERM: NONE SEEN
Trich, Wet Prep: NONE SEEN
Yeast Wet Prep HPF POC: NONE SEEN

## 2017-06-02 MED ORDER — METRONIDAZOLE 500 MG PO TABS: 500.0000 mg | ORAL_TABLET | Freq: Two times a day (BID) | ORAL | 0 refills | Status: DC

## 2017-06-02 MED ORDER — PROMETHAZINE HCL 12.5 MG PO TABS: 12.5000 mg | ORAL_TABLET | Freq: Four times a day (QID) | ORAL | 0 refills | Status: DC | PRN

## 2017-06-02 NOTE — MAU Note (Signed)
Pt having bleeding and vaginal itching. Wants a pregnancy test. She is in menopause.  She doesn't know when it started. Also says she has a cough, n/v/d. Has been seen several times over past week at various emergency rooms. Patient displaying speech patterns which suggest she has some mental illness- speaking nonstop; so difficult to understand.   Gave bus pass to patient.

## 2017-06-02 NOTE — MAU Provider Note (Signed)
History     CSN: [id number]  Arrival date and time: 06/02/17 0140   First Provider Initiated Contact with Patient 06/02/17 0244      Chief Complaint  Patient presents with  . Vaginal Itching   HPI  Betty Henderson is a 57 y.o. female who presents with vaginal itching. Reports to RN vaginal irritation & vaginal bleeding. Requesting a pap smear & a pregnancy test.  Patient has been to ED 8 times since the beginning of the month for various reasons. She is reportedly homeless & schizophrenic. I attempted to obtain a more thorough HPI from her, but she would barely wake up to speak with me. As I was leaving the room she asked me for a pap smear, to which I informed her that would need to be done in an office setting. I asked again about her symptoms and why she is in MAU this evening but patient closed her eyes and asked to be left alone.    Past Medical History:  Diagnosis Date  . Anemia   . Arthritis   . Psychosis (HCC)   . Schizoaffective disorder, bipolar type [facility](HCC)     Past Surgical History:  Procedure Laterality Date  . MOUTH SURGERY    . TUBAL LIGATION      Family History  Family history unknown: Yes    Social History   Tobacco Use  . Smoking status: Current Some Day Smoker    Packs/day: 1.00    Types: Cigarettes  . Smokeless tobacco: Never Used  Substance Use Topics  . Alcohol use: No  . Drug use: No    Allergies:  Allergies  Allergen Reactions  . Other     PT reports having environmental allergies which she used to receive shots for, but cannot specify exact allergens    Medications Prior to Admission  Medication Sig Dispense Refill Last Dose  . cephALEXin (KEFLEX) 500 MG capsule Take 1 capsule (500 mg total) by mouth 2 (two) times daily. (Patient not taking: Reported on 05/29/2017) 14 capsule 0 Not Taking at Unknown time  . hydrocortisone cream 1 % Apply to affected area 2 times daily 15 g 0   . ranitidine (ZANTAC) 150 MG capsule Take 1 capsule (150  mg total) by mouth daily. 30 capsule 0     Review of Systems  Unable to perform ROS: Psychiatric disorder   Physical Exam   Blood pressure (!) 95/52, pulse 78, temperature 98.2 F (36.8 C), temperature source Oral, resp. rate 18, height 5\' 6"  (1.676 m), weight 126 lb 1.9 oz (57.2 kg), SpO2 96 %.  Physical Exam  Nursing note and vitals reviewed. Constitutional: She is oriented to person, place, and time. She appears well-developed and well-nourished. No distress.  HENT:  Head: Normocephalic and atraumatic.  Respiratory: Effort normal. No respiratory distress.  GI: Soft.  Neurological: She is alert and oriented to person, place, and time.  Skin: She is not diaphoretic.  Psychiatric: Her behavior is normal. Judgment and thought content normal. Her affect is inappropriate. Her speech is slurred.    MAU Course  Procedures Results for orders placed or performed during the hospital encounter of 06/02/17 (from the past 24 hour(s))  Urinalysis, Routine w reflex microscopic     Status: Abnormal   Collection Time: 06/02/17  1:53 AM  Result Value Ref Range   Color, Urine YELLOW YELLOW   APPearance CLEAR CLEAR   Specific Gravity, Urine 1.012 1.005 - 1.030   pH 6.0 5.0 -  8.0   Glucose, UA NEGATIVE NEGATIVE mg/dL   Hgb urine dipstick NEGATIVE NEGATIVE   Bilirubin Urine NEGATIVE NEGATIVE   Ketones, ur NEGATIVE NEGATIVE mg/dL   Protein, ur NEGATIVE NEGATIVE mg/dL   Nitrite NEGATIVE NEGATIVE   Leukocytes, UA MODERATE (A) NEGATIVE   RBC / HPF 0-5 0 - 5 RBC/hpf   WBC, UA 0-5 0 - 5 WBC/hpf   Bacteria, UA NONE SEEN NONE SEEN   Squamous Epithelial / LPF 0-5 (A) NONE SEEN   Mucus PRESENT   Pregnancy, urine POC     Status: None   Collection Time: 06/02/17  2:49 AM  Result Value Ref Range   Preg Test, Ur NEGATIVE NEGATIVE  Wet prep, genital     Status: Abnormal   Collection Time: 06/02/17  3:05 AM  Result Value Ref Range   Yeast Wet Prep HPF POC NONE SEEN NONE SEEN   Trich, Wet Prep NONE  SEEN NONE SEEN   Clue Cells Wet Prep HPF POC PRESENT (A) NONE SEEN   WBC, Wet Prep HPF POC FEW (A) NONE SEEN   Sperm NONE SEEN     MDM UPT negative Wet prep & GC/CT collected VSS, NAD  Patient would not discuss her symptoms and did not want to talk about results. Requesting to be left alone so she could sleep in our room. Given list of [facility] resources & a bus pass.    Assessment and Plan  A:  1. BV (bacterial vaginosis)   2. Non-pregnancy nausea    P: Discharge home Rx flagyl & phenergan [facility] resources info given GC/CT pending  Judeth Horn 06/02/2017, 2:44 AM

## 2017-06-02 NOTE — Discharge Instructions (Signed)
[place] Resource Guide (Revised August 2014)    Insufficient Money for Medicine:           United Way: call "211"   MAP Program at [facility] [place] [phone] or [place] [phone]            No Primary Care Doctor:  To locate a primary care doctor that accepts your insurance or provides certain services:           [place] Connect: [phone]           Physician Referral Service: 1-800-533-3463 ask for "My [place]" If no insurance, you need to see if you qualify for GCCN "orange card", call to set      up appointment for eligibility/enrollment at [phone] or [phone] or visit [place] [facility] ([address], [place] and [address] -[place]) to meet with a GCCN enrollment specialist.  Agencies that provide inexpensive (sliding fee scale) medical care:      [facility] - [facility] at [place] - [phone]    [facility]  -  [place] [facility] - [phone]    [facility] - [phone]    [facility] - [phone]    [facility] - [phone]    [facility] - [phone] [facility] - [facility] [place] - [phone] [facility] - [facility] [place] - [phone] [facility]: [phone]  [facility] Clinic: [phone]  Planned Parenthood: [phone]  [facility] - [phone]    Medicaid-accepting [place] Providers:           [facility] - [phone] (No Family Planning accepted)          [address], [phone], Mon-Fri 9am-5pm          [facility] - [phone], [address], Mon-Thursday 8am-5pm, Fri 8am-noon [facility] - [phone]          [address],  [address], Mon-Fri 7:30am-4:30pm          [facility] - [phone]          [address] [place] [address], Mon-Fri 8am-5pm          [facility] - [phone]          [address]          Only accepts [place] Access Medicaid patients after they have their name applied to their card  Self Pay (no insurance) in [place]:           Sickle Cell Patients:  [address], [phone] [facility]: [address], [place] [phone]       [facility] [address], [place] [phone]  [facility]: [address], [phone]          [facility]           [address], [phone] [facility] [address], [phone]           [facility]             [address], [place] [phone]        [facility] - [address]           [phone], Mon-Fri 9am-7pm, Sat 9am-1pm          [facility] - [facility] @ [place]          [address] [place] [address], [phone]          [facility] [place]           [address], [phone] [facility] - [facility] [place] [address], [place] [phone]          [facility]           [address] [place] [address], [phone]  [facility] - [facility]  [address], [phone] [facility] - [facility] [address], [phone]  Dr. Osei-Bonsu           [address], [place], [phone]          [facility]           [address], [phone]          [facility]             [address], [phone]          [facility]           [address], [phone], 1st & 3rd Saturday every month, 10am-1pm OTHERS:  [facility]  (Immigration Access Clinic Only)  [phone] (Thursday only) Strategies for finding a Primary Care Provider:  1) Find a Doctor and Pay Out of Pocket  Although you won't have to find out who is covered by your insurance plan, it is a good idea to ask around and get recommendations. You will then need to call the office and see if the doctor you have chosen will accept you as a new patient and what types of options they offer for patients who are self-pay. Some doctors offer discounts or will set up payment plans for their patients who do not have insurance, but you will need to ask so you aren't surprised when you get to your appointment.  2) Contact [facility] - To see if you qualify for "orange card" access to healthcare safety net providers.  Call for appointment for eligibility/enrollment at [phone] or [phone]. (Uninsured, 0-200% FPL, qualifying info)  Applicants for GCCN are first required to see if they are eligible to enroll in the ACA Marketplace before enrolling in GCCN (and get an exemption if they are not).  GCCN Criteria for acceptance is:  Proof of ACA Marketing exemption - form or documentation  Valid photo ID (driver's license, state identification card, passport, home country ID)  Proof of [place] residency (e.g. driver's license, lease/landlord information, pay stubs with address, utility bill, bank statement, etc.)  Proof of income (1040, last year's tax return, W2, 4 current pay stubs, other income proof)  Proof of assets (current bank statement + 3 most recent, disability paperwork, life insurance info, tax value on autos, etc.)  3) Contact Your Local Health Department  Not all health departments have doctors that can see patients for sick visits,   but many do, so it is worth a call to see if yours does. If you don't know where your local health  department is, you can check in your phone book. The CDC also has a tool to help you locate your state's health department, and many state websites also have listings of all of their local health departments.  4) Find a Walk-in Clinic  If your illness is not likely to be very severe or complicated, you may want to try a walk in clinic. These are popping up all over the country in pharmacies, drugstores, and shopping centers. They're usually staffed by nurse practitioners or physician assistants that have been trained to treat common illnesses and complaints. They're usually fairly quick and inexpensive. However, if you have serious medical issues or chronic medical problems, these are probably not your best option   STD Testing:           [place] [facility] [place], STD Clinic           [address], [place], phone [phone] or [phone]           Monday - Friday, call for an appointment          [place] [facility] [place], STD Clinic           [address], [place], phone [phone] or [phone]           Monday - Friday, call for an appointment Abuse/Neglect:           [place] Child Abuse Hotline: 641-3795           [place] Child Abuse Hotline: 800-378-5315 (After Hours) Emergency Shelter:  [facility] (336) 271-5959  [facility] [place]- (336) 881-5415  [facility] [place] - (336) 235-0330  [facility] - (336) 375-1332 (ages 11-17)  Homeless Day Shelter @ [facility] - (336) 332-0824   Mammograms - Free at [facility] - [place] - 614-3233  Maternity Homes:           [facility]: (336) [phone]   (Homeless mother with children)          [facility]: (704) 372-4663 (Mothers only) [facility]: (336) 370-9232 (Pregnant 16-21 years old) [facility]opt a Mom -(336) 641-7777  [place] Resources  [facility] - [facility] [address], [place] ([phone]) [phone]          [facility] [place]           [address], [place]           [phone]          United Way           [address], [place]           [phone]          [place] [facility]           [address], [place]           [phone]          [place] [facility]           [phone]          [place] Services - [facility]           1-888-581-9988            [facility] [place]           [address]           [phone], Insurance          [place] Child Abuse Hotline           (336) 342-1394           (336) 342-3537 (After Hours)  Behavioral Health Services /Substance Abuse Resources:           [facility] Drug Services: 882-2125           [facility]: 784-9470          [facility]: (336) 285-9073  Narcotics Helpline - 1-866-375-1272          [facility]: (336) 845-3988           Residential & Outpatient Substance Abuse Program - [facility]: 800-659-3381 [employer]  [facility] - (336) 285-2605 Psychological Services:          [facility]: [phone]  [facility]: 1-877-626-1772          [facility]           [address] [place] [address], [place]           ACCESS LINE: 1-800-256-2452     (24 Hour) Mobile Crisis:  [place]:  [facility] on Mental Illness - [place] (336) 370-4264 [facility] - [place] (800) 451-9682 Walk In Crisis Services       [facility] - [address] [place] [address] - [place]  (336)676-6905       [facility] - (336)832-9600 or (336) 832-9700  [facility] - [address] - [place] (336)[phone]  [facility] [place] [facility] - [address], [place] (336) [phone]   Dental Assistance:   If unable to pay or uninsured, contact: [place] [facility] to become qualified for the adult dental clinic. Patient must be enrolled in GCCN (uninsured, 0-200% FPL, qualifying info).  Enroll in GCCN first, then see Primary Care Physician assigned to you, the PCP makes a dental referral. [facility] will receive referral and contacts patient for appointment.  Patients with Medicaid           [address], [phone]  [facility] (Children up to 20 + Pregnant Women) - (336) [phone]  [facility] - [address] - [address] (336) [phone]  If unable to pay, or uninsured: contact [place] [facility] ([phone] in [place] - (Children only + Pregnant Women), [phone] in [place]- Children only) to become qualified for the adult dental clinic  Must see if eligible to enroll in ACA Health Insurance Marketplace before enrolling into the GCCN (exemption required) (1-855-733-3711 for an appointment)  www.healthcare.gov;   1-800-318-2596.  If not eligible for ACA, then go by Department of Health and Human Services to see if eligible for "orange card."  [address], [place] and [address]- [place].  Once you get an orange card, you will have a Primary Care home who will then refer you to dental if needed.     Other Low-Cost Community Dental Services:   [facility] - [phone] (ext [phone])   [address] [place] [address]  Dr. Civils - [phone]   [address]    [employer] - [phone]   [address]           [facility]           [address], [place], [place], [address]           [phone], Ext. [phone]           2nd and 4th Thursday of the month at 6:30am (Simple extractions only - no wisdom teeth or surgery) First come/First serve -First 10 clients served           [facility] ([place] and [place] residents only)          [address], [place], [place], [address]            [phone]                    [place] [facility]           [phone]          [place] [facility]          [phone]         [place] [facility] - [facility]          [phone]   Transportation Options:  Ambulance - 911 - $250-$700 per ride Family Member to accompany patient (if stable) - [place] [employer] - (336) 355-6499  PART - (336) 662-0002  Taxi - (336) 272-5112 - [place]  [facility] - (336) 333-6589 (Application required)  [place] [facility] - (336) 641-4848  

## 2017-06-14 ENCOUNTER — Encounter (HOSPITAL_COMMUNITY): Payer: Self-pay

## 2017-06-14 ENCOUNTER — Emergency Department (HOSPITAL_COMMUNITY)
Admit: 2017-06-14 | Discharge: 2017-06-14 | Disposition: A | Discharge status: Home / Self Care | Payer: Self-pay | Attending: Emergency Medicine | Admitting: Emergency Medicine

## 2017-06-14 DIAGNOSIS — M79605 Pain in left leg: Secondary | ICD-10-CM | POA: Insufficient documentation

## 2017-06-14 DIAGNOSIS — M25511 Pain in right shoulder: Secondary | ICD-10-CM | POA: Insufficient documentation

## 2017-06-14 DIAGNOSIS — M79604 Pain in right leg: Secondary | ICD-10-CM | POA: Insufficient documentation

## 2017-06-14 DIAGNOSIS — Z5321 Procedure and treatment not carried out due to patient leaving prior to being seen by health care provider: Secondary | ICD-10-CM | POA: Insufficient documentation

## 2017-06-14 NOTE — ED Notes (Signed)
Pt complains of bilateral leg pain and right shoulder pain Pt was at [employer] walking around and they called EMS

## 2017-06-15 ENCOUNTER — Emergency Department (HOSPITAL_COMMUNITY)
Admission: EM | Admit: 2017-06-15 | Discharge: 2017-06-16 | Disposition: A | Discharge status: Home / Self Care | Payer: Self-pay | Attending: Emergency Medicine | Admitting: Emergency Medicine

## 2017-06-15 ENCOUNTER — Encounter (HOSPITAL_COMMUNITY): Payer: Self-pay | Admitting: Emergency Medicine

## 2017-06-15 ENCOUNTER — Other Ambulatory Visit: Payer: Self-pay

## 2017-06-15 DIAGNOSIS — M79605 Pain in left leg: Secondary | ICD-10-CM | POA: Insufficient documentation

## 2017-06-15 DIAGNOSIS — G8929 Other chronic pain: Secondary | ICD-10-CM | POA: Insufficient documentation

## 2017-06-15 DIAGNOSIS — Z79899 Other long term (current) drug therapy: Secondary | ICD-10-CM | POA: Insufficient documentation

## 2017-06-15 DIAGNOSIS — F17210 Nicotine dependence, cigarettes, uncomplicated: Secondary | ICD-10-CM | POA: Insufficient documentation

## 2017-06-15 DIAGNOSIS — M79604 Pain in right leg: Secondary | ICD-10-CM | POA: Insufficient documentation

## 2017-06-15 NOTE — ED Notes (Signed)
Pt  Sleeping in lobby, called to pt several times pt will not get up.

## 2017-06-15 NOTE — ED Triage Notes (Signed)
Pt reports having chronic leg pain that has not been improving. Pt wants a shot for arthritis pain.

## 2017-06-16 MED ORDER — ACETAMINOPHEN 500 MG PO TABS
1000.0000 mg | ORAL_TABLET | Freq: Once | ORAL | Status: AC
  Administered 2017-06-16: 1000 mg via ORAL
  Filled 2017-06-16: qty 2

## 2017-06-16 NOTE — ED Notes (Signed)
Informed by staff that pt ambulated into the department escorted by EMS and also ambulated from triage into her room.

## 2017-06-16 NOTE — Discharge Instructions (Addendum)
1. Medications: alternate naprosyn and tylenol for pain control, usual home medications 2. Treatment: drink plenty of fluids, gentle stretching 3. Follow Up: Please followup with your PCP in 2-3 week if no improvement for discussion of your diagnoses and further evaluation after today's visit; if you do not have a primary care doctor use the resource guide provided to find one; Please return to the ER for worsening symptoms or other concerns

## 2017-06-16 NOTE — ED Provider Notes (Signed)
[facility]-EMERGENCY DEPT Provider Note   CSN: [id number] Arrival date & time: 06/15/17  2209     History   Chief Complaint Chief Complaint  Patient presents with  . Leg Pain    HPI Betty Henderson is a 57 y.o. female with a hx of anemia, arthritis, schizoaffective disorder presents to the Emergency Department complaining of gradual, persistent, progressively worsening chronic leg pain onset "a long time ago."  Points that both of her legs hurt tonight.  She reports the pain is the same as previous pain she has had in the past.  She reports that she has been able to walk but this does cause pain.  Pt denies low back pain, leg weakness.  Pt reports someone gave her Motrin yesterday which helped but today she is requesting a shot. Pt denies leg swelling, fever, chills, abd pain.     The history is provided by the patient and medical records. No language interpreter was used.    Past Medical History:  Diagnosis Date  . Anemia   . Arthritis   . Psychosis (HCC)   . Schizoaffective disorder, bipolar type (HCC)     There are no active problems to display for this patient.   Past Surgical History:  Procedure Laterality Date  . MOUTH SURGERY    . TUBAL LIGATION       OB History   None      Home Medications    Prior to Admission medications   Medication Sig Start Date End Date Taking? Authorizing Provider  cephALEXin (KEFLEX) 500 MG capsule Take 1 capsule (500 mg total) by mouth 2 (two) times daily. Patient not taking: Reported on 05/29/2017 05/23/17   Ward, Layla Maw, DO  hydrocortisone cream 1 % Apply to affected area 2 times daily 05/31/17   Lorre Nick, MD  metroNIDAZOLE (FLAGYL) 500 MG tablet Take 1 tablet (500 mg total) by mouth 2 (two) times daily. 06/02/17   Judeth Horn, NP  promethazine (PHENERGAN) 12.5 MG tablet Take 1 tablet (12.5 mg total) by mouth every 6 (six) hours as needed for nausea or vomiting. 06/02/17   Judeth Horn, NP    ranitidine (ZANTAC) 150 MG capsule Take 1 capsule (150 mg total) by mouth daily. 05/31/17   Lorre Nick, MD    Family History Family History  Family history unknown: Yes    Social History Social History   Tobacco Use  . Smoking status: Current Some Day Smoker    Packs/day: 1.00    Types: Cigarettes  . Smokeless tobacco: Never Used  Substance Use Topics  . Alcohol use: No  . Drug use: No     Allergies   Other   Review of Systems Review of Systems  Constitutional: Negative for chills and fever.  Gastrointestinal: Negative for abdominal pain, nausea and vomiting.  Musculoskeletal: Positive for arthralgias and myalgias. Negative for back pain, joint swelling, neck pain and neck stiffness.  Skin: Negative for wound.  Neurological: Negative for numbness.  Hematological: Does not bruise/bleed easily.  Psychiatric/Behavioral: The patient is not nervous/anxious.   All other systems reviewed and are negative.    Physical Exam Updated Vital Signs BP 107/66 (BP Location: Right Arm)   Pulse 84   Temp 98.3 F (36.8 C) (Oral)   Resp 16   Ht 5\' 6"  (1.676 m)   Wt 52.2 kg (115 lb)   SpO2 100%   BMI 18.56 kg/m   Physical Exam  Constitutional: She appears well-developed and well-nourished.  No distress.  HENT:  Head: Normocephalic and atraumatic.  Mouth/Throat: Oropharynx is clear and moist. No oropharyngeal exudate.  Eyes: Conjunctivae are normal.  Neck: Normal range of motion. Neck supple.  Full ROM without pain  Cardiovascular: Normal rate, regular rhythm and intact distal pulses.  Pulmonary/Chest: Effort normal and breath sounds normal. No respiratory distress. She has no wheezes.  Abdominal: Soft. She exhibits no distension. There is no tenderness.  Musculoskeletal:  Full range of motion of the T-spine and L-spine No midline tenderness to the  T-spine or L-spine No tenderness to palpation of the paraspinous muscles of the L-spine Full range of motion of bilateral  hips, knees, ankles and toes No swelling, erythema or tenderness to palpation of her hips, knees, ankles or toes  Lymphadenopathy:    She has no cervical adenopathy.  Neurological: She is alert.  Speech is clear and goal oriented, follows commands Normal 5/5 strength in upper and lower extremities bilaterally including dorsiflexion and plantar flexion, strong and equal grip strength Sensation normal to light and sharp touch Moves extremities without ataxia, coordination intact Normal gait Normal balance No Clonus  Skin: Skin is warm and dry. No rash noted. She is not diaphoretic. No erythema.  Psychiatric: She has a normal mood and affect. Her behavior is normal.  Nursing note and vitals reviewed.    ED Treatments / Results   Procedures Procedures (including critical care time)  Medications Ordered in ED Medications  acetaminophen (TYLENOL) tablet 1,000 mg (1,000 mg Oral Given 06/16/17 0032)     Initial Impression / Assessment and Plan / ED Course  I have reviewed the triage vital signs and the nursing notes.  Pertinent labs & imaging results that were available during my care of the patient were reviewed by me and considered in my medical decision making (see chart for details).  Clinical Course as of Jun 17 527  Mon Jun 16, 2017  0018 Pt ambulatory here in the ED with steady gait   [HM]    Clinical Course User Index [HM] Abbygael Curtiss, Dahlia ClientHannah, [place] JerseyPA-C    Patient presents with acute on chronic bilateral leg pain.  No swelling of her joints to suggest septic arthritis or injury.  Patient denies back pain and has a reassuring back exam.  She is neurovascularly intact.  She ambulates without difficulty.  Patient given acetaminophen here and discharged home.  She states understanding and is in agreement with the plan.  Final Clinical Impressions(s) / ED Diagnoses   Final diagnoses:  Chronic pain of both lower extremities    ED Discharge Orders    None         Mardene SayerMuthersbaugh, Boyd KerbsHannah, PA-C 06/16/17 0531    Dione BoozeGlick, David, MD 06/16/17 [id number]

## 2017-07-08 ENCOUNTER — Emergency Department (HOSPITAL_BASED_OUTPATIENT_CLINIC_OR_DEPARTMENT_OTHER)
Admission: EM | Admit: 2017-07-08 | Discharge: 2017-07-09 | Disposition: A | Discharge status: Home / Self Care | Payer: Self-pay | Attending: Emergency Medicine | Admitting: Emergency Medicine

## 2017-07-08 ENCOUNTER — Other Ambulatory Visit: Payer: Self-pay

## 2017-07-08 ENCOUNTER — Encounter (HOSPITAL_BASED_OUTPATIENT_CLINIC_OR_DEPARTMENT_OTHER): Payer: Self-pay | Admitting: Emergency Medicine

## 2017-07-08 DIAGNOSIS — Z79899 Other long term (current) drug therapy: Secondary | ICD-10-CM | POA: Insufficient documentation

## 2017-07-08 DIAGNOSIS — M1990 Unspecified osteoarthritis, unspecified site: Secondary | ICD-10-CM | POA: Insufficient documentation

## 2017-07-08 DIAGNOSIS — F17210 Nicotine dependence, cigarettes, uncomplicated: Secondary | ICD-10-CM | POA: Insufficient documentation

## 2017-07-08 HISTORY — DX: Malingerer (conscious simulation): Z76.5

## 2017-07-08 HISTORY — DX: Other chronic pain: G89.29

## 2017-07-08 MED ORDER — ACETAMINOPHEN 500 MG PO TABS: 1000.0000 mg | ORAL_TABLET | Freq: Once | ORAL | Status: DC

## 2017-07-08 MED ORDER — NAPROXEN 250 MG PO TABS: 500.0000 mg | ORAL_TABLET | Freq: Once | ORAL | Status: DC

## 2017-07-08 NOTE — ED Triage Notes (Addendum)
Pt brought in by EMS c/o chronic pain , [facility]. DX Malingering

## 2017-07-08 NOTE — ED Notes (Addendum)
Patient walked out without medications. Reports that she is going to call her son, and did not stop to listen to this RN

## 2017-07-09 ENCOUNTER — Encounter (HOSPITAL_BASED_OUTPATIENT_CLINIC_OR_DEPARTMENT_OTHER): Payer: Self-pay | Admitting: Emergency Medicine

## 2017-07-09 NOTE — ED Provider Notes (Signed)
[facility] EMERGENCY DEPARTMENT Provider Note   CSN: [id number] Arrival date & time: 07/08/17  2226     History   Chief Complaint Chief Complaint  Patient presents with  . Generalized Body Aches    HPI Betty Henderson is a 57 y.o. female.  The history is provided by the patient.  Illness  This is a chronic problem. The current episode started more than 1 week ago. The problem occurs constantly. The problem has not changed since onset.Pertinent negatives include no chest pain, no abdominal pain, no headaches and no shortness of breath. Nothing aggravates the symptoms. Nothing relieves the symptoms. She has tried nothing for the symptoms. The treatment provided no relief.  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  [facility]  Patient states " leave me alone."  Does not wants to speak any further and pulls covers over her head.    Past Medical History:  Diagnosis Date  . Anemia   . Arthritis   . Chronic pain   . Drug-seeking behavior   . Malingering   . Psychosis (HCC)   . Schizoaffective disorder, bipolar type (HCC)     There are no active problems to display for this patient.   Past Surgical History:  Procedure Laterality Date  . MOUTH SURGERY    . TUBAL LIGATION       OB History   None      Home Medications    Prior to Admission medications   Medication Sig Start Date End Date Taking? Authorizing Provider  cephALEXin (KEFLEX) 500 MG capsule Take 1 capsule (500 mg total) by mouth 2 (two) times daily. Patient not taking: Reported on 05/29/2017 05/23/17   Ward, Layla MawKristen N, DO  hydrocortisone cream 1 % Apply to affected area 2 times daily 05/31/17   Lorre NickAllen, Anthony, MD  metroNIDAZOLE (FLAGYL) 500 MG tablet Take 1 tablet (500 mg total) by mouth 2 (two) times daily. 06/02/17   Judeth HornLawrence, Erin, NP  promethazine (PHENERGAN) 12.5 MG tablet Take 1 tablet (12.5 mg total) by mouth every 6 (six) hours as needed for nausea or vomiting. 06/02/17    Judeth HornLawrence, Erin, NP  ranitidine (ZANTAC) 150 MG capsule Take 1 capsule (150 mg total) by mouth daily. 05/31/17   Lorre NickAllen, Anthony, MD    Family History Family History  Family history unknown: Yes    Social History Social History   Tobacco Use  . Smoking status: Current Some Day Smoker    Packs/day: 1.00    Types: Cigarettes  . Smokeless tobacco: Never Used  Substance Use Topics  . Alcohol use: No  . Drug use: Yes    Types: Cocaine     Allergies   Other   Review of Systems Review of Systems  Respiratory: Negative for shortness of breath.   Cardiovascular: Negative for chest pain.  Gastrointestinal: Negative for abdominal pain.  Musculoskeletal: Positive for arthralgias.  Neurological: Negative for headaches.  All other systems reviewed and are negative.    Physical Exam Updated Vital Signs BP 117/77   Pulse 65   Temp 98 F (36.7 C)   Resp 18   Wt 52.2 kg (115 lb)   SpO2 99%   BMI 18.56 kg/m   Physical Exam  Constitutional: She appears well-developed and well-nourished. No distress.  HENT:  Head: Normocephalic and atraumatic.  Nose: Nose normal.  Eyes: Conjunctivae and EOM are normal.  Neck: Normal range of motion. Neck supple.  Cardiovascular: Normal rate, regular rhythm,  normal heart sounds and intact distal pulses.  Pulmonary/Chest: Effort normal and breath sounds normal. No stridor. No respiratory distress. She has no wheezes. She has no rales.  Abdominal: Soft. Bowel sounds are normal. There is no tenderness.  Musculoskeletal: Normal range of motion. She exhibits no tenderness or deformity.  Neurological: She is alert.  Skin: Skin is warm and dry. Capillary refill takes less than 2 seconds.     ED Treatments / Results  Labs (all labs ordered are listed, but only abnormal results are displayed) Labs Reviewed - No data to display  EKG None  Radiology No results found.  Procedures Procedures (including critical care time)  Medications  Ordered in ED Medications  naproxen (NAPROSYN) tablet 500 mg (has no administration in time range)  acetaminophen (TYLENOL) tablet 1,000 mg (has no administration in time range)      Final Clinical Impressions(s) / ED Diagnoses   Final diagnoses:  Arthritis    Return for weakness, numbness, changes in vision or speech, fevers >100.4 unrelieved by medication, shortness of breath, intractable vomiting, or diarrhea, abdominal pain, Inability to tolerate liquids or food, cough, altered mental status or any concerns. No signs of systemic illness or infection. The patient is nontoxic-appearing on exam and vital signs are within normal limits.   I have reviewed the triage vital signs and the nursing notes. Pertinent labs &imaging results that were available during my care of the patient were reviewed by me and considered in my medical decision making (see chart for details).  After history, exam, and medical workup I feel the patient has been appropriately medically screened and is safe for discharge home. Pertinent diagnoses were discussed with the patient. Patient was given return precautions.    Tim Corriher, MD 07/09/17 [phone]

## 2017-08-22 DIAGNOSIS — Z5900 Homelessness unspecified: Secondary | ICD-10-CM | POA: Insufficient documentation

## 2017-10-05 ENCOUNTER — Encounter (HOSPITAL_COMMUNITY): Payer: Self-pay | Admitting: Emergency Medicine

## 2017-10-05 ENCOUNTER — Emergency Department (HOSPITAL_COMMUNITY)
Admission: EM | Admit: 2017-10-05 | Discharge: 2017-10-05 | Disposition: A | Discharge status: Home / Self Care | Payer: Self-pay | Attending: Emergency Medicine | Admitting: Emergency Medicine

## 2017-10-05 ENCOUNTER — Other Ambulatory Visit: Payer: Self-pay

## 2017-10-05 DIAGNOSIS — Z139 Encounter for screening, unspecified: Secondary | ICD-10-CM | POA: Insufficient documentation

## 2017-10-05 DIAGNOSIS — F17210 Nicotine dependence, cigarettes, uncomplicated: Secondary | ICD-10-CM | POA: Insufficient documentation

## 2017-10-05 LAB — COMPREHENSIVE METABOLIC PANEL
ALBUMIN: 4.1 g/dL (ref 3.5–5.0)
ALT: 43 U/L (ref 0–44)
AST: 40 U/L (ref 15–41)
Alkaline Phosphatase: 70 U/L (ref 38–126)
Anion gap: 8 (ref 5–15)
BUN: 16 mg/dL (ref 6–20)
CHLORIDE: 103 mmol/L (ref 98–111)
CO2: 26 mmol/L (ref 22–32)
CREATININE: 0.74 mg/dL (ref 0.44–1.00)
Calcium: 9.9 mg/dL (ref 8.9–10.3)
GFR calc Af Amer: >60 mL/min (ref >60)
GLUCOSE: 110 mg/dL — AB (ref 70–99)
Potassium: 4.4 mmol/L (ref 3.5–5.1)
SODIUM: 137 mmol/L (ref 135–145)
Total Bilirubin: 0.7 mg/dL (ref 0.3–1.2)
Total Protein: 7.6 g/dL (ref 6.5–8.1)

## 2017-10-05 LAB — CBC WITH DIFFERENTIAL/PLATELET
Basophils Absolute: 0 10*3/uL (ref 0.0–0.1)
Basophils Relative: 0 %
Eosinophils Absolute: 0.1 10*3/uL (ref 0.0–0.7)
Eosinophils Relative: 2 %
HEMATOCRIT: 38.5 % (ref 36.0–46.0)
HEMOGLOBIN: 12.5 g/dL (ref 12.0–15.0)
LYMPHS PCT: 30 %
Lymphs Abs: 1.9 10*3/uL (ref 0.7–4.0)
MCH: 29.8 pg (ref 26.0–34.0)
MCHC: 32.5 g/dL (ref 30.0–36.0)
MCV: 91.7 fL (ref 78.0–100.0)
MONO ABS: 0.3 10*3/uL (ref 0.1–1.0)
MONOS PCT: 4 %
NEUTROS ABS: 4.1 10*3/uL (ref 1.7–7.7)
Neutrophils Relative %: 64 %
Platelets: 268 10*3/uL (ref 150–400)
RBC: 4.2 MIL/uL (ref 3.87–5.11)
RDW: 14.4 % (ref 11.5–15.5)
WBC: 6.4 10*3/uL (ref 4.0–10.5)

## 2017-10-05 LAB — RAPID URINE DRUG SCREEN, HOSP PERFORMED
Amphetamines: NOT DETECTED
Benzodiazepines: POSITIVE — AB
Cocaine: NOT DETECTED
Opiates: NOT DETECTED
Tetrahydrocannabinol: NOT DETECTED

## 2017-10-05 LAB — I-STAT BETA HCG BLOOD, ED (MC, WL, AP ONLY)

## 2017-10-05 LAB — ETHANOL: Alcohol, Ethyl (B): <10 mg/dL (ref <10)

## 2017-10-05 NOTE — ED Notes (Signed)
Patient became agitated when discharged and cursed under her breath. Pt states "[facility] Pt refused to listen to any discharge instructions. Pt escorted to the lobby; all belongings returned to patient at discharge. No noted distress at this time.

## 2017-10-05 NOTE — ED Notes (Addendum)
Patient arrived to unit and is noted to be pleasant and cooperative with care. Pt admits to hearing voices, but denies any thoughts of suicide or homicide. Pt changed into paper scrubs; personal belongings placed in locker #26. No distress noted at this time. Patient immediately went to bed and is asleep.

## 2017-10-05 NOTE — ED Triage Notes (Addendum)
[facility] but on questioning pt states she is hearing voices telling her to do things but denies command hallucinations for self injury or assualt. [facility] for pruritus at 2024

## 2017-10-05 NOTE — ED Provider Notes (Addendum)
[facility]-EMERGENCY DEPT Provider Note   CSN: [id number] Arrival date & time: 10/05/17  0204     History   Chief Complaint Chief Complaint  Patient presents with  . Medication Reaction    hearing voices  . Wound Check    HPI Betty Henderson is a 57 y.o. female.  Patient presents to the emergency department with no chief complaint.  She states that she thinks she needs a tetanus shot.  She denies any wound or injury.  Reportedly she told the triage nurse that she had been having auditory and/or visual hallucinations.  She denies any suicidal or homicidal ideations.  She denies any symptoms at this time, and states that she just wants to sleep.  The history is provided by the patient. No language interpreter was used.    Past Medical History:  Diagnosis Date  . Anemia   . Arthritis   . Chronic pain   . Drug-seeking behavior   . Malingering   . Psychosis (HCC)   . Schizoaffective disorder, bipolar type (HCC)     There are no active problems to display for this patient.   Past Surgical History:  Procedure Laterality Date  . MOUTH SURGERY    . TUBAL LIGATION       OB History   None      Home Medications    Prior to Admission medications   Medication Sig Start Date End Date Taking? Authorizing Provider  cephALEXin (KEFLEX) 500 MG capsule Take 1 capsule (500 mg total) by mouth 2 (two) times daily. Patient not taking: Reported on 05/29/2017 05/23/17   Ward, Layla Maw, DO  hydrocortisone cream 1 % Apply to affected area 2 times daily 05/31/17   Lorre Nick, MD  metroNIDAZOLE (FLAGYL) 500 MG tablet Take 1 tablet (500 mg total) by mouth 2 (two) times daily. 06/02/17   Judeth Horn, NP  promethazine (PHENERGAN) 12.5 MG tablet Take 1 tablet (12.5 mg total) by mouth every 6 (six) hours as needed for nausea or vomiting. 06/02/17   Judeth Horn, NP  ranitidine (ZANTAC) 150 MG capsule Take 1 capsule (150 mg total) by mouth daily. 05/31/17   Lorre Nick, MD    Family History Family History  Family history unknown: Yes    Social History Social History   Tobacco Use  . Smoking status: Current Some Day Smoker    Packs/day: 1.00    Types: Cigarettes  . Smokeless tobacco: Never Used  Substance Use Topics  . Alcohol use: No  . Drug use: Yes    Types: Cocaine     Allergies   Other   Review of Systems Review of Systems  All other systems reviewed and are negative.    Physical Exam Updated Vital Signs BP (!) 105/91 (BP Location: Left Arm)   Pulse 79   Temp 98.3 F (36.8 C) (Oral)   Resp 15   Ht 5\' 6"  (1.676 m)   Wt 52.2 kg (115 lb)   SpO2 100%   BMI 18.56 kg/m   Physical Exam  Constitutional: She is oriented to person, place, and time. She appears well-developed and well-nourished.  HENT:  Head: Normocephalic and atraumatic.  Eyes: Pupils are equal, round, and reactive to light. Conjunctivae and EOM are normal.  Neck: Normal range of motion. Neck supple.  Cardiovascular: Normal rate and regular rhythm. Exam reveals no gallop and no friction rub.  No murmur heard. Pulmonary/Chest: Effort normal and breath sounds normal. No respiratory distress. She  has no wheezes. She has no rales. She exhibits no tenderness.  Abdominal: Soft. Bowel sounds are normal. She exhibits no distension and no mass. There is no tenderness. There is no rebound and no guarding.  Musculoskeletal: Normal range of motion. She exhibits no edema or tenderness.  Neurological: She is alert and oriented to person, place, and time.  Skin: Skin is warm and dry.  Psychiatric: She has a normal mood and affect. Her behavior is normal. Judgment and thought content normal.  Nursing note and vitals reviewed.    ED Treatments / Results  Labs (all labs ordered are listed, but only abnormal results are displayed) Labs Reviewed  CBC WITH DIFFERENTIAL/PLATELET  COMPREHENSIVE METABOLIC PANEL  ETHANOL  RAPID URINE DRUG SCREEN, HOSP PERFORMED    I-STAT BETA HCG BLOOD, ED ([facility], [facility], [facility] ONLY)    EKG None  Radiology No results found.  Procedures Procedures (including critical care time)  Medications Ordered in ED Medications - No data to display   Initial Impression / Assessment and Plan / ED Course  I have reviewed the triage vital signs and the nursing notes.  Pertinent labs & imaging results that were available during my care of the patient were reviewed by me and considered in my medical decision making (see chart for details).     Patient here with no chief complaint.  She denies any suicidal or homicidal ideations.  Her laboratory work-up which was ordered in triage shows normal.  Her vital signs are stable.  She has no abnormal exam findings.  Will discharge.  Primary care follow-up.  In care everywhere, patient noted to have [facility] for pruritus and chronic osteomyelitis.  She was given prescriptions for doxycycline and Cipro along with Benadryl.  Encourage patient to take medications as prescribed.  Final Clinical Impressions(s) / ED Diagnoses   Final diagnoses:  Encounter for medical screening examination    ED Discharge Orders    None       Roxy HorsemanBrowning, Nadim Malia, PA-C 10/05/17 0306    Roxy HorsemanBrowning, Kipton Skillen, PA-C 10/05/17 0309    Molpus, Jonny RuizJohn, MD 10/05/17 [id number]

## 2018-02-09 DIAGNOSIS — L298 Other pruritus: Secondary | ICD-10-CM | POA: Insufficient documentation

## 2018-02-09 DIAGNOSIS — F17210 Nicotine dependence, cigarettes, uncomplicated: Secondary | ICD-10-CM | POA: Insufficient documentation

## 2018-02-10 ENCOUNTER — Encounter (HOSPITAL_COMMUNITY): Payer: Self-pay | Admitting: Emergency Medicine

## 2018-02-10 ENCOUNTER — Other Ambulatory Visit: Payer: Self-pay

## 2018-02-10 ENCOUNTER — Emergency Department (HOSPITAL_COMMUNITY)
Admission: EM | Admit: 2018-02-10 | Discharge: 2018-02-10 | Disposition: A | Discharge status: Home / Self Care | Payer: Self-pay | Attending: Emergency Medicine | Admitting: Emergency Medicine

## 2018-02-10 DIAGNOSIS — L299 Pruritus, unspecified: Secondary | ICD-10-CM

## 2018-02-10 HISTORY — DX: Osteopetrosis: Q78.2

## 2018-02-10 MED ORDER — DIPHENHYDRAMINE HCL 25 MG PO CAPS
25.0000 mg | ORAL_CAPSULE | Freq: Once | ORAL | Status: AC
  Administered 2018-02-10: 25 mg via ORAL
  Filled 2018-02-10: qty 1

## 2018-02-10 MED ORDER — IBUPROFEN 800 MG PO TABS
800.0000 mg | ORAL_TABLET | Freq: Once | ORAL | Status: AC
  Administered 2018-02-10: 800 mg via ORAL
  Filled 2018-02-10: qty 1

## 2018-02-10 NOTE — Discharge Instructions (Signed)
Can continue benadryl at home for itching.  Recommend some lotion for dry skin. Return here for new concerns.

## 2018-02-10 NOTE — ED Triage Notes (Signed)
Pt from home with c/o itching to bilateral lower legs. Pt states she got out of jail today and now needs a tetanus shot for her allergies. Pt is unsure what is causing her allergy. No hives noted. No other symptoms. No airway obstruction or SOB

## 2018-02-10 NOTE — ED Provider Notes (Signed)
[facility]-EMERGENCY DEPT Provider Note   CSN: [id number] Arrival date & time: 02/09/18  2336     History   Chief Complaint Chief Complaint  Patient presents with  . Pruritis    HPI Betty Henderson is a 57 y.o. female.  The history is provided by the patient and medical records.     57 year old female with history of anemia, arthritis, chronic pain, malingering, drug-seeking behavior, schizoaffective disorder, presenting to the ED for itching.  States she just got out of jail yesterday and was walking down the street in the cold next to some "dead trees" and states she started itching.  She did not come in contact with anything directly that she is aware of.  States her skin feels dry.  States she [facility] but could not [facility].  States she feels like she needs to "get warm" for the itching to stop.  Past Medical History:  Diagnosis Date  . Anemia   . Arthritis   . Chronic pain   . Drug-seeking behavior   . Malingering   . Osteopetrosis   . Psychosis (HCC)   . Schizoaffective disorder, bipolar type (HCC)     There are no active problems to display for this patient.   Past Surgical History:  Procedure Laterality Date  . MOUTH SURGERY    . TUBAL LIGATION       OB History   None      Home Medications    Prior to Admission medications   Medication Sig Start Date End Date Taking? Authorizing Provider  cephALEXin (KEFLEX) 500 MG capsule Take 1 capsule (500 mg total) by mouth 2 (two) times daily. Patient not taking: Reported on 05/29/2017 05/23/17   Ward, Layla MawKristen N, DO  hydrocortisone cream 1 % Apply to affected area 2 times daily 05/31/17   Lorre NickAllen, Anthony, MD  metroNIDAZOLE (FLAGYL) 500 MG tablet Take 1 tablet (500 mg total) by mouth 2 (two) times daily. 06/02/17   Judeth HornLawrence, Erin, NP  promethazine (PHENERGAN) 12.5 MG tablet Take 1 tablet (12.5 mg total) by mouth every 6 (six) hours as needed for nausea or  vomiting. 06/02/17   Judeth HornLawrence, Erin, NP  ranitidine (ZANTAC) 150 MG capsule Take 1 capsule (150 mg total) by mouth daily. 05/31/17   Lorre NickAllen, Anthony, MD    Family History Family History  Family history unknown: Yes    Social History Social History   Tobacco Use  . Smoking status: Current Some Day Smoker    Packs/day: 1.00    Types: Cigarettes  . Smokeless tobacco: Never Used  Substance Use Topics  . Alcohol use: No  . Drug use: Yes    Types: Cocaine    Comment: Pt denies      Allergies   Other   Review of Systems Review of Systems  Skin:       itching  All other systems reviewed and are negative.    Physical Exam Updated Vital Signs BP 104/65   Pulse (!) 58   Temp (!) 97.5 F (36.4 C) (Oral)   Resp 16   Ht 5\' 6"  (1.676 m)   Wt 54.4 kg   SpO2 100%   BMI 19.37 kg/m   Physical Exam  Constitutional: She is oriented to person, place, and time. She appears well-developed and well-nourished.  HENT:  Head: Normocephalic and atraumatic.  Mouth/Throat: Oropharynx is clear and moist.  No lip/tongue swelling, handling secretions well  Eyes: Pupils are equal, round, and reactive to light. Conjunctivae and EOM are normal.  Neck: Normal range of motion.  Cardiovascular: Normal rate, regular rhythm and normal heart sounds.  Pulmonary/Chest: Effort normal and breath sounds normal.  Abdominal: Soft. Bowel sounds are normal.  Musculoskeletal: Normal range of motion.  Neurological: She is alert and oriented to person, place, and time.  Skin: Skin is warm and dry.  Skin overall is very dry and scaly but there is no apparent rash or infectious appearing skin lesions  Psychiatric:  Very odd affect  Nursing note and vitals reviewed.    ED Treatments / Results  Labs (all labs ordered are listed, but only abnormal results are displayed) Labs Reviewed - No data to display  EKG None  Radiology No results found.  Procedures Procedures (including critical care  time)  Medications Ordered in ED Medications  diphenhydrAMINE (BENADRYL) capsule 25 mg (25 mg Oral Given 02/10/18 0455)  ibuprofen (ADVIL,MOTRIN) tablet 800 mg (800 mg Oral Given 02/10/18 0455)     Initial Impression / Assessment and Plan / ED Course  I have reviewed the triage vital signs and the nursing notes.  Pertinent labs & imaging results that were available during my care of the patient were reviewed by me and considered in my medical decision making (see chart for details).  57 y.o. F here with generalized itching.  She has dry skin on exam but no rash or other infectious skin lesions.  She has no tongue/lip swelling, handling secretions well, and lungs are clear.  VSS.  Does not appear to have acute allergic reaction.  Recommended benadryl for itching, lotion for dry skin.  She can follow-up as an OP.  Can return here for any new/acute changes.  Final Clinical Impressions(s) / ED Diagnoses   Final diagnoses:  Itching    ED Discharge Orders    None       Garlon Hatchet, PA-C 02/10/18 0530    Gilda Crease, MD 02/11/18 [phone]

## 2018-03-19 ENCOUNTER — Encounter (HOSPITAL_COMMUNITY): Payer: Self-pay | Admitting: Obstetrics and Gynecology

## 2018-03-19 ENCOUNTER — Emergency Department (HOSPITAL_COMMUNITY)
Admission: EM | Admit: 2018-03-19 | Discharge: 2018-03-19 | Discharge status: Against Medical Advice | Payer: Self-pay | Attending: Emergency Medicine | Admitting: Emergency Medicine

## 2018-03-19 ENCOUNTER — Other Ambulatory Visit: Payer: Self-pay

## 2018-03-19 DIAGNOSIS — Z5321 Procedure and treatment not carried out due to patient leaving prior to being seen by health care provider: Secondary | ICD-10-CM | POA: Insufficient documentation

## 2018-03-19 DIAGNOSIS — R0981 Nasal congestion: Secondary | ICD-10-CM | POA: Insufficient documentation

## 2018-03-19 NOTE — ED Notes (Signed)
Pt called again from triage with no answer 

## 2018-03-19 NOTE — ED Triage Notes (Signed)
Per EMS: Pt is coming [place] a gas station and complains that she has had "[place] flu" for years and that she becomes "dizzy when water is near" Patient is complaining of post nasal drip as well.

## 2018-03-19 NOTE — ED Notes (Signed)
Pt called from the lobby with no answer 

## 2018-03-20 ENCOUNTER — Emergency Department (HOSPITAL_COMMUNITY)
Admission: EM | Admit: 2018-03-20 | Discharge: 2018-03-20 | Disposition: A | Discharge status: Home / Self Care | Payer: Self-pay | Attending: Emergency Medicine | Admitting: Emergency Medicine

## 2018-03-20 DIAGNOSIS — R05 Cough: Secondary | ICD-10-CM | POA: Insufficient documentation

## 2018-03-20 DIAGNOSIS — R6889 Other general symptoms and signs: Secondary | ICD-10-CM

## 2018-03-20 DIAGNOSIS — F17210 Nicotine dependence, cigarettes, uncomplicated: Secondary | ICD-10-CM | POA: Insufficient documentation

## 2018-03-20 DIAGNOSIS — Z79899 Other long term (current) drug therapy: Secondary | ICD-10-CM | POA: Insufficient documentation

## 2018-03-20 DIAGNOSIS — R059 Cough, unspecified: Secondary | ICD-10-CM

## 2018-03-20 MED ORDER — ACETAMINOPHEN 325 MG PO TABS
650.0000 mg | ORAL_TABLET | Freq: Once | ORAL | Status: AC
  Administered 2018-03-20: 650 mg via ORAL
  Filled 2018-03-20: qty 2

## 2018-03-20 NOTE — ED Notes (Signed)
ED Provider at bedside. 

## 2018-03-20 NOTE — Discharge Instructions (Signed)
Follow-up with your primary care doctor.  If you do not have one, see the information below.  Return to ER for new or worsening symptoms, any additional concerns.   To find a primary care or specialty doctor please call [phone] or [phone] to access "[place] Find a Doctor Service."  You may also go on the [facility] website at [email or web address]  There are also multiple [facility] practices throughout the [place] that are frequently accepting new patients. You may find a clinic that is close to your home and contact them.  [facility] - [address] [id number] [phone]  [facility] [place] (also locations in [place] and [place]) - [address] [place] [place] [phone] [phone]  [facility] - [address] [phone]

## 2018-03-20 NOTE — ED Provider Notes (Signed)
[facility] DEPT Provider Note   CSN: [id number] Arrival date & time: 03/20/18  1020     History   Chief Complaint Chief Complaint  Patient presents with  . Diarrhea  . Influenza    HPI Milissa Fesperman is a 57 y.o. female.  The history is provided by the patient and medical records. No language interpreter was used.  Diarrhea   Associated symptoms include cough.  Influenza  Presenting symptoms: cough, diarrhea and sore throat   Presenting symptoms: no shortness of breath   Associated symptoms: nasal congestion    Idonia Zollinger is a 57 y.o. female  with a PMH of chronic pain, arthritis, schizoaffective disorder, malingering behavior who presents to the Emergency Department complaining of " having the flu".  When asked when she began to feel badly, she states " all year" she reports associated diarrhea.  When asked how frequently, she states "whenever people ask me all of these questions or I get stressed out" denies any current abdominal pain.  No vomiting.  Unsure if she has had a fever.  She does report a cough for several weeks now.  States that she has been seen at [facility] for the overall times and they are not doing anything to help her.  Past Medical History:  Diagnosis Date  . Anemia   . Arthritis   . Chronic pain   . Drug-seeking behavior   . Malingering   . Osteopetrosis   . Psychosis ([place])   . Schizoaffective disorder, bipolar type ([place])     There are no active problems to display for this patient.   Past Surgical History:  Procedure Laterality Date  . MOUTH SURGERY    . TUBAL LIGATION       OB History   No obstetric history on file.      Home Medications    Prior to Admission medications   Medication Sig Start Date End Date Taking? Authorizing Provider  Throat Lozenges (LOZENGES MT) Use as directed 1 each in the mouth or throat daily as needed (sore throat).   Yes [provider]    cephALEXin (KEFLEX) 500 MG capsule Take 1 capsule (500 mg total) by mouth 2 (two) times daily. Patient not taking: Reported on 05/29/2017 05/23/17   Ignazio Kincaid, Delice Bison, DO  hydrocortisone cream 1 % Apply to affected area 2 times daily Patient not taking: Reported on 03/20/2018 05/31/17   Lacretia Leigh, MD  metroNIDAZOLE (FLAGYL) 500 MG tablet Take 1 tablet (500 mg total) by mouth 2 (two) times daily. Patient not taking: Reported on 03/20/2018 06/02/17   Jorje Guild, NP  promethazine (PHENERGAN) 12.5 MG tablet Take 1 tablet (12.5 mg total) by mouth every 6 (six) hours as needed for nausea or vomiting. Patient not taking: Reported on 03/20/2018 06/02/17   Jorje Guild, NP  ranitidine (ZANTAC) 150 MG capsule Take 1 capsule (150 mg total) by mouth daily. Patient not taking: Reported on 03/20/2018 05/31/17   Lacretia Leigh, MD    Family History Family History  Family history unknown: Yes    Social History Social History   Tobacco Use  . Smoking status: Current Some Day Smoker    Packs/day: 1.00    Types: Cigarettes  . Smokeless tobacco: Never Used  Substance Use Topics  . Alcohol use: No  . Drug use: Yes    Types: Cocaine    Comment: Pt denies      Allergies   Lemon oil; Strawberry extract;  and Other   Review of Systems Review of Systems  HENT: Positive for congestion and sore throat.   Respiratory: Positive for cough. Negative for shortness of breath.   Gastrointestinal: Positive for diarrhea.  All other systems reviewed and are negative.    Physical Exam Updated Vital Signs BP 121/72 (BP Location: Left Arm)   Pulse 71   Temp 98.4 F (36.9 C) (Oral)   Resp 17   SpO2 100%   Physical Exam Vitals signs and nursing note reviewed.  Constitutional:      General: She is not in acute distress.    Appearance: She is well-developed.  HENT:     Head: Normocephalic and atraumatic.     Mouth/Throat:     Pharynx: Oropharynx is clear.  Cardiovascular:     Rate and Rhythm:  Normal rate and regular rhythm.     Heart sounds: Normal heart sounds. No murmur.  Pulmonary:     Effort: Pulmonary effort is normal. No respiratory distress.     Breath sounds: Normal breath sounds.     Comments: Lungs clear to auscultation bilaterally. Abdominal:     General: There is no distension.     Palpations: Abdomen is soft.     Comments: No abdominal tenderness.  Skin:    General: Skin is warm and dry.  Neurological:     Mental Status: She is alert and oriented to person, place, and time.      ED Treatments / Results  Labs (all labs ordered are listed, but only abnormal results are displayed) Labs Reviewed - No data to display  EKG None  Radiology No results found.  Procedures Procedures (including critical care time)  Medications Ordered in ED Medications  acetaminophen (TYLENOL) tablet 650 mg (650 mg Oral Given 03/20/18 1141)     Initial Impression / Assessment and Plan / ED Course  I have reviewed the triage vital signs and the nursing notes.  Pertinent labs & imaging results that were available during my care of the patient were reviewed by me and considered in my medical decision making (see chart for details).     Tessah Patchen is a 57 y.o. female who presents to ED for feeling sick over the last year.  She endorses cough, congestion for a few weeks now.  Seen at [facility] multiple times this month for URI symptoms.  On exam, patient is afebrile, hemodynamically stable with clear lung sounds and benign abdominal exam.  Initial plan was to obtain chest x-ray and i-STAT Chem-8.  When I discussed this plan with her, she states "[place] did all that already I don't need none of that." Refused orders. When asked what I could do to help her, she states "feed me".  She was provided food and beverage.  On repeat evaluation, patient feels much better after mac & cheese, [place] sandwich and a Coke.  Repeat abdominal exam benign.  Vitals wdl.  Evaluation does not show pathology that would require ongoing emergent intervention or inpatient treatment.  Strongly encouraged she see a primary care doctor.   Final Clinical Impressions(s) / ED Diagnoses   Final diagnoses:  Cough  Feels sick    ED Discharge Orders    None       Ceria Suminski, Ozella Almond, PA-C 03/20/18 1256    Margette Fast, MD 03/20/18 1840

## 2018-03-20 NOTE — ED Notes (Signed)
Patient given macaroni and cheese, [place] sandwich, and coke.

## 2018-03-20 NOTE — ED Notes (Signed)
Patient refused discharge vital signs. 

## 2018-03-20 NOTE — ED Notes (Addendum)
Pt. Called to triage x 2 no answer.

## 2018-03-20 NOTE — ED Triage Notes (Signed)
Pt reports that she was seen here last night for flu symptoms. Reports diarrhea, cough and sore throat. Reports that she is homeless and [place] in [place].

## 2018-03-21 ENCOUNTER — Emergency Department (HOSPITAL_COMMUNITY)
Admission: EM | Admit: 2018-03-21 | Discharge: 2018-03-21 | Discharge status: Against Medical Advice | Payer: Self-pay | Attending: Emergency Medicine | Admitting: Emergency Medicine

## 2018-03-21 ENCOUNTER — Emergency Department (HOSPITAL_COMMUNITY): Admission: EM | Admit: 2018-03-21 | Discharge: 2018-03-21 | Discharge status: Against Medical Advice | Payer: Self-pay

## 2018-03-21 DIAGNOSIS — Z5321 Procedure and treatment not carried out due to patient leaving prior to being seen by health care provider: Secondary | ICD-10-CM | POA: Insufficient documentation

## 2018-03-21 DIAGNOSIS — R21 Rash and other nonspecific skin eruption: Secondary | ICD-10-CM | POA: Insufficient documentation

## 2018-03-21 NOTE — ED Notes (Signed)
Bed: WTR6 Expected date:  Expected time:  Means of arrival:  Comments: 

## 2018-03-21 NOTE — ED Triage Notes (Signed)
Pt complains of a rash on her shoulders and back, pt was here earlier but didn't answer when called

## 2018-04-07 ENCOUNTER — Emergency Department (HOSPITAL_COMMUNITY)
Admission: EM | Admit: 2018-04-07 | Discharge: 2018-04-07 | Discharge status: Against Medical Advice | Payer: Self-pay | Attending: Emergency Medicine | Admitting: Emergency Medicine

## 2018-04-07 ENCOUNTER — Encounter (HOSPITAL_COMMUNITY): Payer: Self-pay | Admitting: *Deleted

## 2018-04-07 ENCOUNTER — Emergency Department (HOSPITAL_COMMUNITY)
Admission: EM | Admit: 2018-04-07 | Discharge: 2018-04-08 | Disposition: A | Discharge status: Home / Self Care | Payer: Self-pay | Attending: Emergency Medicine | Admitting: Emergency Medicine

## 2018-04-07 ENCOUNTER — Encounter (HOSPITAL_COMMUNITY): Payer: Self-pay | Admitting: Emergency Medicine

## 2018-04-07 ENCOUNTER — Other Ambulatory Visit: Payer: Self-pay

## 2018-04-07 DIAGNOSIS — R8290 Unspecified abnormal findings in urine: Secondary | ICD-10-CM

## 2018-04-07 DIAGNOSIS — M79671 Pain in right foot: Secondary | ICD-10-CM | POA: Insufficient documentation

## 2018-04-07 DIAGNOSIS — R82998 Other abnormal findings in urine: Secondary | ICD-10-CM | POA: Insufficient documentation

## 2018-04-07 DIAGNOSIS — Z5900 Homelessness unspecified: Secondary | ICD-10-CM

## 2018-04-07 DIAGNOSIS — F17210 Nicotine dependence, cigarettes, uncomplicated: Secondary | ICD-10-CM | POA: Insufficient documentation

## 2018-04-07 LAB — URINALYSIS, ROUTINE W REFLEX MICROSCOPIC
BILIRUBIN URINE: NEGATIVE
Bacteria, UA: NONE SEEN
Glucose, UA: NEGATIVE mg/dL
Hgb urine dipstick: NEGATIVE
Ketones, ur: NEGATIVE mg/dL
Nitrite: NEGATIVE
PH: 6 (ref 5.0–8.0)
Protein, ur: NEGATIVE mg/dL
SPECIFIC GRAVITY, URINE: 1.008 (ref 1.005–1.030)

## 2018-04-07 LAB — I-STAT BETA HCG BLOOD, ED (MC, WL, AP ONLY): I-stat hCG, quantitative: <5 m[IU]/mL (ref <5)

## 2018-04-07 NOTE — ED Triage Notes (Signed)
Pt here with c/o uti symptoms reports that she might also be pregnant and needs a pregnancy test.

## 2018-04-07 NOTE — ED Provider Notes (Signed)
[facility] EMERGENCY [facility] Provider Note   CSN: [id number] Arrival date & time: 04/07/18  1252     History   Chief Complaint Chief Complaint  Patient presents with  . Urinary Tract Infection  . Possible Pregnancy    HPI Candie ChromanDebra Ann Malay is a 58 y.o. female.  HPI   Pt is a 58 y/o female with a h/o anemia, arthritis, drug seeking behavior, malingering, schizoaffective disorder, who presents to the ED today with multiple complaints.   She is c/o rhinorrhea, congestion, cough, chills that began in october. She has been evaluated in the ED several times for URI sxs.    She also had diarrhea last week but this has resolved. No abd pain, nausea, or vomiting.  She further states that her urine smells malodorous. Is c/o dysuria, frequency. States this has been ongoing for 1 month.   She also thinks she may be pregnant.  She does state that she is gone through menopause already.  Past Medical History:  Diagnosis Date  . Anemia   . Arthritis   . Chronic pain   . Drug-seeking behavior   . Malingering   . Osteopetrosis   . Psychosis ([facility])   . Schizoaffective disorder, bipolar type ([facility])     There are no active problems to display for this patient.   Past Surgical History:  Procedure Laterality Date  . MOUTH SURGERY    . TUBAL LIGATION       OB History   No obstetric history on file.      Home Medications    Prior to Admission medications   Medication Sig Start Date End Date Taking? Authorizing Provider  cephALEXin (KEFLEX) 500 MG capsule Take 1 capsule (500 mg total) by mouth 2 (two) times daily. Patient not taking: Reported on 05/29/2017 05/23/17   Ward, Layla MawKristen N, DO  hydrocortisone cream 1 % Apply to affected area 2 times daily Patient not taking: Reported on 03/20/2018 05/31/17   Lorre NickAllen, Anthony, MD  metroNIDAZOLE (FLAGYL) 500 MG tablet Take 1 tablet (500 mg total) by mouth 2 (two) times daily. Patient not taking: Reported on 03/20/2018  06/02/17   Judeth HornLawrence, Erin, NP  promethazine (PHENERGAN) 12.5 MG tablet Take 1 tablet (12.5 mg total) by mouth every 6 (six) hours as needed for nausea or vomiting. Patient not taking: Reported on 03/20/2018 06/02/17   Judeth HornLawrence, Erin, NP  ranitidine (ZANTAC) 150 MG capsule Take 1 capsule (150 mg total) by mouth daily. Patient not taking: Reported on 03/20/2018 05/31/17   Lorre NickAllen, Anthony, MD  Throat Lozenges (LOZENGES MT) Use as directed 1 each in the mouth or throat daily as needed (sore throat).    [provider]    Family History Family History  Family history unknown: Yes    Social History Social History   Tobacco Use  . Smoking status: Current Some Day Smoker    Packs/day: 1.00    Types: Cigarettes  . Smokeless tobacco: Never Used  Substance Use Topics  . Alcohol use: No  . Drug use: Yes    Types: Cocaine    Comment: Pt denies      Allergies   Lemon oil; Strawberry extract; and Other   Review of Systems Review of Systems  Constitutional: Positive for chills.  HENT: Positive for congestion, rhinorrhea and sore throat. Negative for ear pain.   Eyes: Negative for visual disturbance.  Respiratory: Positive for cough. Negative for shortness of breath.   Cardiovascular: Negative for chest pain.  Gastrointestinal: Positive for diarrhea (resolved). Negative for abdominal pain, constipation, nausea and vomiting.  Genitourinary: Positive for dysuria and frequency. Negative for hematuria.  Musculoskeletal: Negative for back pain.  Skin: Negative for rash.  Neurological: Negative for headaches.  All other systems reviewed and are negative.    Physical Exam Updated Vital Signs BP 118/87   Pulse 74   Temp 98.4 F (36.9 C) (Oral)   Resp 17   SpO2 100%   Physical Exam Vitals signs and nursing note reviewed.  Constitutional:      General: She is not in acute distress.    Appearance: She is well-developed. She is not ill-appearing or toxic-appearing.  HENT:      Head: Normocephalic and atraumatic.     Right Ear: Tympanic membrane normal.     Left Ear: Tympanic membrane normal.     Nose: Nose normal.     Mouth/Throat:     Mouth: Mucous membranes are moist.     Pharynx: No oropharyngeal exudate or posterior oropharyngeal erythema.  Eyes:     Conjunctiva/sclera: Conjunctivae normal.  Neck:     Musculoskeletal: Neck supple.  Cardiovascular:     Rate and Rhythm: Normal rate and regular rhythm.     Heart sounds: Normal heart sounds. No murmur.  Pulmonary:     Effort: Pulmonary effort is normal. No respiratory distress.     Breath sounds: Normal breath sounds. No stridor. No wheezing or rhonchi.  Abdominal:     General: Bowel sounds are normal. There is no distension.     Palpations: Abdomen is soft.     Tenderness: There is no abdominal tenderness. There is no right CVA tenderness, left CVA tenderness, guarding or rebound.  Skin:    General: Skin is warm and dry.  Neurological:     Mental Status: She is alert.  Psychiatric:        Mood and Affect: Mood normal.      ED Treatments / Results  Labs (all labs ordered are listed, but only abnormal results are displayed) Labs Reviewed  URINALYSIS, ROUTINE W REFLEX MICROSCOPIC - Abnormal; Notable for the following components:      Result Value   Leukocytes, UA TRACE (*)    All other components within normal limits  URINE CULTURE  I-STAT BETA HCG BLOOD, ED ([facility], [facility], [facility] ONLY)    EKG None  Radiology No results found.  Procedures Procedures (including critical care time)  Medications Ordered in ED Medications - No data to display   Initial Impression / Assessment and Plan / ED Course  I have reviewed the triage vital signs and the nursing notes.  Pertinent labs & imaging results that were available during my care of the patient were reviewed by me and considered in my medical decision making (see chart for details).     Final Clinical Impressions(s) / ED Diagnoses   Final  diagnoses:  Malodorous urine    Pt is a 58 y/o female with a h/o anemia, arthritis, drug seeking behavior, malingering, schizoaffective disorder, who presents to the ED today with multiple complaints.   She is c/o URI sxs for months. Has had several ED evals for URI sxs.  Today her lungs are clear.  HEENT benign.  She is nontoxic and afebrile.  Suspect viral syndrome, do not feel further work-up is necessary.   She also had diarrhea last week but this has resolved. No abd pain, nausea, or vomiting.  She does not appear dehydrated, abdomen is soft and  nontender.  Do not feel the labs or imaging would benefit patient at this time.  She also think she is pregnant.  She has gone through menopause.  I-STAT beta-hCG was drawn from triage.  It is negative.  She further states that her urine smells malodorous. Is c/o dysuria, frequency. States this has been ongoing for 1 month.  UA with trace leukocytes.   On re-evaluation pt was not in the room and reportedly eloped from the ED prior to her receiving her results.   ED Discharge Orders    None       Rayne DuCouture, Keison Glendinning S, PA-C 04/07/18 1411    Wynetta FinesMessick, Peter C, MD 04/07/18 [place] [id number]
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
## 2018-04-07 NOTE — ED Triage Notes (Signed)
Per EMS pt was picked up on the [facility], she c/o generalized body aches, sts she was walking and then couldn't walk any ore because of the pain. Per EMS pt was at the [facility] ED today and left after triage.

## 2018-04-08 ENCOUNTER — Inpatient Hospital Stay (HOSPITAL_COMMUNITY)
Admission: AD | Admit: 2018-04-08 | Discharge: 2018-04-08 | Disposition: A | Discharge status: Home / Self Care | Payer: Self-pay | Attending: Family Medicine | Admitting: Family Medicine

## 2018-04-08 DIAGNOSIS — T730XXA Starvation, initial encounter: Secondary | ICD-10-CM

## 2018-04-08 DIAGNOSIS — F17210 Nicotine dependence, cigarettes, uncomplicated: Secondary | ICD-10-CM | POA: Insufficient documentation

## 2018-04-08 DIAGNOSIS — R350 Frequency of micturition: Secondary | ICD-10-CM | POA: Insufficient documentation

## 2018-04-08 DIAGNOSIS — Z5900 Homelessness unspecified: Secondary | ICD-10-CM

## 2018-04-08 LAB — URINALYSIS, ROUTINE W REFLEX MICROSCOPIC
Bilirubin Urine: NEGATIVE
Glucose, UA: NEGATIVE mg/dL
Hgb urine dipstick: NEGATIVE
Ketones, ur: NEGATIVE mg/dL
Leukocytes, UA: NEGATIVE
Nitrite: NEGATIVE
Protein, ur: NEGATIVE mg/dL
Specific Gravity, Urine: 1.006 (ref 1.005–1.030)
pH: 7 (ref 5.0–8.0)

## 2018-04-08 LAB — URINE CULTURE: Culture: NO GROWTH

## 2018-04-08 NOTE — ED Notes (Signed)
Provider d/c pt.

## 2018-04-08 NOTE — MAU Note (Signed)
PT SAYS SHE FEELS SORE  ALL OVER   - STARTED  2 [facility] AGO.   TOOK [facility]-- THIS AM - HELPED.    SHE WENT TO [facility]-  AND [facility] -   DID UA  LABS.

## 2018-04-08 NOTE — Discharge Instructions (Addendum)
Go to a shelter.  Go to [address] during the day, they have a Publishing rights manager there that can help you with your medical problems and your medications.  Go to [facility] if you need to be back on your psychiatric medications.

## 2018-04-08 NOTE — MAU Note (Signed)
Pt said she was hungry and asked for something to eat. HC brought her a sandwich.

## 2018-04-08 NOTE — MAU Provider Note (Signed)
Chief Complaint:  No chief complaint on file.   First Provider Initiated Contact with Patient 04/08/18 0419      HPI: Betty Henderson is a 58 y.o. postmenopausal female. who presents to maternity admissions reporting "I don't know why I am here"  Told RN it was because she was sore all over and needed a pap smear.  Also told RN she had urinary frequency.  Had pain in epigastrum but it was totally relieved by Alka-Selzer. . She reports no vaginal bleeding, vaginal itching/burning, urinary symptoms, h/a, dizziness, n/v, or fever/chills.    Keeps head under blanket, mumbles.  Reluctant to answer questions or be examined, though did comply with coaxing.  States "I just want another sandwich".  Was escorted out of [facility] a few hours ago due to belligerent behavior.  Denies any real reason to be here, doesn't want an exam or treatment for anything.   Level 5 Caveat due to mental illness and poor communication.  HPI RN Note: PT SAYS SHE FEELS SORE  ALL OVER   - STARTED  2 MTHS AGO.   TOOK ALKSELTIZER-- THIS AM - HELPED.    SHE WENT TO [facility]-  AND [facility] -   DID UA  LABS.    Past Medical History: Past Medical History:  Diagnosis Date  . Anemia   . Arthritis   . Chronic pain   . Drug-seeking behavior   . Malingering   . Osteopetrosis   . Psychosis (HCC)   . Schizoaffective disorder, bipolar type (HCC)     Past obstetric history: OB History  No obstetric history on file.    Past Surgical History: Past Surgical History:  Procedure Laterality Date  . MOUTH SURGERY    . TUBAL LIGATION      Family History: Family History  Family history unknown: Yes    Social History: Social History   Tobacco Use  . Smoking status: Current Some Day Smoker    Packs/day: 1.00    Types: Cigarettes  . Smokeless tobacco: Never Used  Substance Use Topics  . Alcohol use: No  . Drug use: Yes    Types: Cocaine    Comment: Pt denies     Allergies:  Allergies  Allergen Reactions  . Lemon Oil Rash   . Strawberry Extract Rash  . Other     PT reports having environmental allergies which she used to receive shots for, but cannot specify exact allergens    Meds:  Medications Prior to Admission  Medication Sig Dispense Refill Last Dose  . cephALEXin (KEFLEX) 500 MG capsule Take 1 capsule (500 mg total) by mouth 2 (two) times daily. (Patient not taking: Reported on 05/29/2017) 14 capsule 0 Not Taking at Unknown time  . hydrocortisone cream 1 % Apply to affected area 2 times daily (Patient not taking: Reported on 03/20/2018) 15 g 0 Not Taking at Unknown time  . metroNIDAZOLE (FLAGYL) 500 MG tablet Take 1 tablet (500 mg total) by mouth 2 (two) times daily. (Patient not taking: Reported on 03/20/2018) 14 tablet 0 Not Taking at Unknown time  . promethazine (PHENERGAN) 12.5 MG tablet Take 1 tablet (12.5 mg total) by mouth every 6 (six) hours as needed for nausea or vomiting. (Patient not taking: Reported on 03/20/2018) 30 tablet 0 Not Taking at Unknown time  . ranitidine (ZANTAC) 150 MG capsule Take 1 capsule (150 mg total) by mouth daily. (Patient not taking: Reported on 03/20/2018) 30 capsule 0 Not Taking at Unknown time  . Throat  Lozenges (LOZENGES MT) Use as directed 1 each in the mouth or throat daily as needed (sore throat).   03/20/2018 at Unknown time    I have reviewed patient's Past Medical Hx, Surgical Hx, Family Hx, Social Hx, medications and allergies.  ROS:  (did answer questions with frequent prompting) Review of Systems  Constitutional: Positive for chills and fatigue. Negative for fever.  Respiratory: Negative for shortness of breath.   Gastrointestinal: Negative for abdominal pain, constipation, diarrhea, nausea and vomiting.  Genitourinary: Negative for vaginal bleeding.  Musculoskeletal: Negative for back pain.  Neurological: Negative for dizziness.   Other systems negative    Physical Exam   Patient Vitals for the past 24 hrs:  BP Temp Temp src Pulse Resp Height Weight   04/08/18 0340 119/69 97.8 F (36.6 C) Oral 70 20 5\' 6"  (1.676 m) 65.2 kg   Constitutional: Well-developed female in no acute distress.  Cardiovascular: normal rate and rhythm, no ectopy audible Respiratory: normal effort, no distress. Lungs CTAB  GI: Abd soft, non-tender.  Nondistended.  No rebound, No guarding.  Bowel Sounds audible  MS: Extremities nontender, no edema, normal ROM Neurologic: Alert and oriented x 4.   Grossly nonfocal. GU: Neg CVAT. Skin:  Warm and Dry Psych:  Affect appropriate.  PELVIC EXAM: Deferred, no complaints    Labs: Results for orders placed or performed during the hospital encounter of 04/08/18 (from the past 24 hour(s))  Urinalysis, Routine w reflex microscopic     Status: Abnormal   Collection Time: 04/08/18  3:41 AM  Result Value Ref Range   Color, Urine STRAW (A) YELLOW   APPearance CLEAR CLEAR   Specific Gravity, Urine 1.006 1.005 - 1.030   pH 7.0 5.0 - 8.0   Glucose, UA NEGATIVE NEGATIVE mg/dL   Hgb urine dipstick NEGATIVE NEGATIVE   Bilirubin Urine NEGATIVE NEGATIVE   Ketones, ur NEGATIVE NEGATIVE mg/dL   Protein, ur NEGATIVE NEGATIVE mg/dL   Nitrite NEGATIVE NEGATIVE   Leukocytes, UA NEGATIVE NEGATIVE      Imaging:  No results found.  MAU Course/MDM: I have ordered labs as follows: UA Imaging ordered: none Results reviewed. No evidence of UTI   Treatments in MAU included none except given sandwich.   Pt stable at time of discharge.  Bus pass given by RN  Assessment: Urinary frequency with normal exam Homelessness Hunger  Plan: Discharge home Recommend Followup with Primary doctor, number to [facility] provided  Encouraged to return here or to other Urgent Care/ED if she develops worsening of symptoms, increase in pain, fever, or other concerning symptoms.   Wynelle Bourgeois CNM, MSN Certified Nurse-Midwife 04/08/2018 4:19 AM

## 2018-04-08 NOTE — ED Notes (Signed)
security called and [employer] escourting pt out

## 2018-04-08 NOTE — Discharge Instructions (Signed)
Protein-Energy Malnutrition Protein-energy malnutrition is when a person does not eat enough protein, fat, and calories. When this happens over time, it can lead to severe loss of muscle tissue (muscle wasting). This condition also affects the body's defense system (immune system) and can lead to other health problems. What are the causes? This condition may be caused by:  Not eating enough protein, fat, or calories.  Having certain chronic medical conditions.  Eating too little. What increases the risk? The following factors may make you more likely to develop this condition:  Living in poverty.  Long-term hospitalization.  Alcohol or drug dependency. Addiction often leads to a lifestyle in which proper diet is ignored. Dependency can also hurt the metabolism and the body's ability to absorb nutrients.  Eating disorders, such as anorexia nervosa or bulimia.  Chewing or swallowing problems. People with these disorders may not eat enough.  Having certain conditions, such as: ? Inflammatory bowel disease. Inflammation of the intestines makes it difficult for the body to absorb nutrients. ? Cancer or AIDS. These diseases can cause a loss of appetite. ? Chronic heart failure. This interferes with how the body uses nutrients. ? Cystic fibrosis. This disease can make it difficult for the body to absorb nutrients.  Eating a diet that extremely restricts protein, fat, or calorie intake. What are the signs or symptoms? Symptoms of this condition include:  Fatigue.  Weakness.  Dizziness.  Fainting.  Weight loss.  Loss of muscle tone and muscle mass.  Poor immune response.  Lack of menstruation.  Poor memory.  Hair loss.  Skin changes. How is this diagnosed? This condition may be diagnosed based on:  Your medical and dietary history.  A physical exam. This may include a measurement of your body mass index (BMI).  Blood tests. How is this treated? This condition may  be managed with:  Nutrition therapy. This may include working with a diet and nutrition specialist (dietitian).  Treatment for underlying conditions. People with severe protein-energy malnutrition may need to be treated in a hospital. This may involve receiving nutrition and fluids through an IV. Follow these instructions at home:   Eat a balanced diet. In each meal, include at least one food that is high in protein. Foods that are high in protein include: ? Meat. ? Poultry. ? Fish. ? Eggs. ? Cheese. ? Milk. ? Beans. ? Nuts.  Eat nutrient-rich foods that are easy to swallow and digest, such as: ? Fruit and yogurt smoothies. ? Oatmeal with nut butter.  Try to eat six small meals each day instead of three large meals.  Take vitamin and protein supplements as told by your health care provider or dietitian.  Follow your health care provider's recommendations about exercise and activity.  Keep all follow-up visits as told by your health care provider. This is important. Contact a health care provider if you:  Have increased weakness or fatigue.  Faint.  Are a woman and you stop having your period (menstruating).  Have rapid hair loss.  Have unexpected weight loss.  Have diarrhea.  Have nausea and vomiting. Get help right away if you have:  Difficulty breathing.  Chest pain. Summary  Protein-energy malnutrition is when a person does not eat enough protein, fat, and calories.  Protein-energy malnutrition can lead to severe loss of muscle tissue (muscle wasting). This condition also affects the body's defense system (immune system) and can lead to other health problems.  Talk with your health care provider about treatment for this  condition. Effective treatment depends on the underlying cause of the malnutrition. This information is not intended to replace advice given to you by your health care provider. Make sure you discuss any questions you have with your health  care provider. Document Released: 01/25/2005 Document Revised: 03/26/2017 Document Reviewed: 03/26/2017 Elsevier Interactive Patient Education  2019 [employer]   Urinary Frequency, Adult Urinary frequency means urinating more often than usual. You may urinate every 1-2 hours even though you drink a normal amount of fluid and do not have a bladder infection or condition. Although you urinate more often than normal, the total amount of urine produced in a day is normal. With urinary frequency, you may have an urgent need to urinate often. The stress and anxiety of needing to find a bathroom quickly can make this urge worse. This condition may go away on its own or you may need treatment at home. Home treatment may include bladder training, exercises, taking medicines, or making changes to your diet. Follow these instructions at home: Bladder health   Keep a bladder diary if told by your health care provider. Keep track of: ? What you eat and drink. ? How often you urinate. ? How much you urinate.  Follow a bladder training program if told by your health care provider. This may include: ? Learning to delay going to the bathroom. ? Double urinating (voiding). This helps if you are not completely emptying your bladder. ? Scheduled voiding.  Do Kegel exercises as told by your health care provider. Kegel exercises strengthen the muscles that help control urination, which may help the condition. Eating and drinking  If told by your health care provider, make diet changes, such as: ? Avoiding caffeine. ? Drinking fewer fluids, especially alcohol. ? Not drinking in the evening. ? Avoiding foods or drinks that may irritate the bladder. These include coffee, tea, soda, artificial sweeteners, citrus, tomato-based foods, and chocolate. ? Eating foods that help prevent or ease constipation. Constipation can make this condition worse. Your health care provider may recommend that you:  Drink  enough fluid to keep your urine pale yellow.  Take over-the-counter or prescription medicines.  Eat foods that are high in fiber, such as beans, whole grains, and fresh fruits and vegetables.  Limit foods that are high in fat and processed sugars, such as fried or sweet foods. General instructions  Take over-the-counter and prescription medicines only as told by your health care provider.  Keep all follow-up visits as told by your health care provider. This is important. Contact a health care provider if:  You start urinating more often.  You feel pain or irritation when you urinate.  You notice blood in your urine.  Your urine looks cloudy.  You develop a fever.  You begin vomiting. Get help right away if:  You are unable to urinate. Summary  Urinary frequency means urinating more often than usual. With urinary frequency, you may urinate every 1-2 hours even though you drink a normal amount of fluid and do not have a bladder infection or other bladder condition.  Your health care provider may recommend that you keep a bladder diary, follow a bladder training program, or make dietary changes.  If told by your health care provider, do Kegel exercises to strengthen the muscles that help control urination.  Take over-the-counter and prescription medicines only as told by your health care provider.  Contact a health care provider if your symptoms do not improve or get worse. This information is  not intended to replace advice given to you by your health care provider. Make sure you discuss any questions you have with your health care provider. Document Released: 01/05/2009 Document Revised: 09/18/2017 Document Reviewed: 09/18/2017 Elsevier Interactive Patient Education  2019 [employer].

## 2018-04-08 NOTE — ED Notes (Signed)
Pt yelled at doctor and used profanity  Pt is hostile and aggressive  Pt tossed hospital phone at tech

## 2018-04-24 ENCOUNTER — Emergency Department (HOSPITAL_COMMUNITY)
Admission: EM | Admit: 2018-04-24 | Discharge: 2018-04-24 | Disposition: A | Discharge status: Home / Self Care | Payer: Self-pay | Attending: Emergency Medicine | Admitting: Emergency Medicine

## 2018-04-24 ENCOUNTER — Other Ambulatory Visit: Payer: Self-pay

## 2018-04-24 ENCOUNTER — Encounter (HOSPITAL_COMMUNITY): Payer: Self-pay | Admitting: *Deleted

## 2018-04-24 DIAGNOSIS — J40 Bronchitis, not specified as acute or chronic: Secondary | ICD-10-CM | POA: Insufficient documentation

## 2018-04-24 DIAGNOSIS — F17210 Nicotine dependence, cigarettes, uncomplicated: Secondary | ICD-10-CM | POA: Insufficient documentation

## 2018-04-24 MED ORDER — DOXYCYCLINE HYCLATE 100 MG PO TABS
100.0000 mg | ORAL_TABLET | Freq: Once | ORAL | Status: AC
  Administered 2018-04-24: 100 mg via ORAL
  Filled 2018-04-24: qty 1

## 2018-04-24 MED ORDER — DOXYCYCLINE HYCLATE 100 MG PO CAPS: 100.0000 mg | ORAL_CAPSULE | Freq: Two times a day (BID) | ORAL | 0 refills | Status: DC

## 2018-04-24 NOTE — ED Notes (Signed)
Pt will not elaborate on condition. Pt upset with assessment questions. Stated she wants to see a doctor.

## 2018-04-24 NOTE — Discharge Instructions (Addendum)
Continue to take your home medications in addition to the medication we give you. Call [facility] for a follow up appointment.

## 2018-04-24 NOTE — ED Provider Notes (Signed)
[facility]-EMERGENCY DEPT Provider Note   CSN: [id number] Arrival date & time: 04/24/18  1914     History   Chief Complaint Chief Complaint  Patient presents with  . Arm Pain    right  . Leg Pain    right    HPI Betty Henderson is a 58 y.o. female who presents to the ED with cough and congestion that has been going on for a few days. Patient reports that since winter started she has been sick off and on but the past few days she has had body aches and productive cough.    HPI  Past Medical History:  Diagnosis Date  . Anemia   . Arthritis   . Chronic pain   . Drug-seeking behavior   . Malingering   . Osteopetrosis   . Psychosis (HCC)   . Schizoaffective disorder, bipolar type (HCC)     There are no active problems to display for this patient.   Past Surgical History:  Procedure Laterality Date  . MOUTH SURGERY    . TUBAL LIGATION       OB History   No obstetric history on file.      Home Medications    Prior to Admission medications   Medication Sig Start Date End Date Taking? Authorizing Provider  doxycycline (VIBRAMYCIN) 100 MG capsule Take 1 capsule (100 mg total) by mouth 2 (two) times daily. 04/24/18   Janne Napoleon, NP  ranitidine (ZANTAC) 150 MG capsule Take 1 capsule (150 mg total) by mouth daily. Patient not taking: Reported on 03/20/2018 05/31/17   Lorre Nick, MD  Throat Lozenges (LOZENGES MT) Use as directed 1 each in the mouth or throat daily as needed (sore throat).    [provider]    Family History Family History  Family history unknown: Yes    Social History Social History   Tobacco Use  . Smoking status: Current Some Day Smoker    Packs/day: 1.00    Types: Cigarettes  . Smokeless tobacco: Never Used  Substance Use Topics  . Alcohol use: No  . Drug use: Yes    Types: Cocaine    Comment: Pt denies      Allergies   Lemon oil; Strawberry extract; and Other   Review of Systems Review  of Systems  Constitutional: Negative for fever.  HENT: Positive for congestion.   Eyes: Negative for discharge and redness.  Respiratory: Positive for cough. Negative for shortness of breath.   Gastrointestinal: Negative for abdominal pain and vomiting.  Musculoskeletal: Positive for myalgias.  Skin: Negative for rash.  Neurological: Negative for headaches.     Physical Exam Updated Vital Signs BP (!) 118/57 (BP Location: Right Arm)   Pulse 88   Temp 97.8 F (36.6 C) (Oral)   Resp 20   Ht 5\' 6"  (1.676 m)   Wt 65.2 kg   SpO2 99%   BMI 23.20 kg/m   Physical Exam Vitals signs and nursing note reviewed.  Constitutional:      General: She is not in acute distress.    Appearance: She is well-developed.  HENT:     Head: Normocephalic.     Nose: Congestion present.     Mouth/Throat:     Pharynx: Oropharynx is clear.  Eyes:     Conjunctiva/sclera: Conjunctivae normal.  Neck:     Musculoskeletal: Neck supple.  Cardiovascular:     Rate and Rhythm: Normal rate and regular rhythm.  Pulmonary:  Effort: Pulmonary effort is normal.     Breath sounds: Rhonchi present.  Abdominal:     Palpations: Abdomen is soft.     Tenderness: There is no abdominal tenderness.  Musculoskeletal: Normal range of motion.  Skin:    General: Skin is warm and dry.  Neurological:     Mental Status: She is alert and oriented to person, place, and time.      ED Treatments / Results  Labs (all labs ordered are listed, but only abnormal results are displayed) Labs Reviewed - No data to display  Radiology No results found.  Procedures Procedures (including critical care time)  Medications Ordered in ED Medications  doxycycline (VIBRA-TABS) tablet 100 mg (has no administration in time range)     Initial Impression / Assessment and Plan / ED Course  I have reviewed the triage vital signs and the nursing notes. Dr. Patria Mane in to examine the patient and discuss plan of care with her.  Patient stable for d/c without respiratory distress and 02 SAT 99% on R/A.   Final Clinical Impressions(s) / ED Diagnoses   Final diagnoses:  Bronchitis    ED Discharge Orders         Ordered    doxycycline (VIBRAMYCIN) 100 MG capsule  2 times daily     04/24/18 2046           Kerrie [place], [place] 04/24/18 2049    Azalia Bilis, MD 04/24/18 2131

## 2018-04-24 NOTE — ED Triage Notes (Signed)
Pt stated "I came here to see about my arm & leg (pt indicates right) 'cause I had it x-rayed @ [facility] and they told me it was broken.  I brought the bus here because this is the best.  [place].  I've had the [place] flu."  Pt's last x-ray for right am & leg were 2018.  Pt is experiencing flight of ideas.

## 2018-04-25 ENCOUNTER — Emergency Department (HOSPITAL_COMMUNITY)
Admission: EM | Admit: 2018-04-25 | Discharge: 2018-04-25 | Disposition: A | Discharge status: Home / Self Care | Payer: Self-pay | Attending: Emergency Medicine | Admitting: Emergency Medicine

## 2018-04-25 DIAGNOSIS — F17210 Nicotine dependence, cigarettes, uncomplicated: Secondary | ICD-10-CM | POA: Insufficient documentation

## 2018-04-25 DIAGNOSIS — R52 Pain, unspecified: Secondary | ICD-10-CM | POA: Insufficient documentation

## 2018-04-25 MED ORDER — IBUPROFEN 200 MG PO TABS
600.0000 mg | ORAL_TABLET | Freq: Once | ORAL | Status: AC
  Administered 2018-04-25: 600 mg via ORAL
  Filled 2018-04-25: qty 3

## 2018-04-25 NOTE — Discharge Instructions (Signed)
Use Motrin as directed 

## 2018-04-25 NOTE — ED Provider Notes (Signed)
[facility]-EMERGENCY DEPT Provider Note   CSN: [id number] Arrival date & time: 04/25/18  1805     History   Chief Complaint Chief Complaint  Patient presents with  . Extremity Pain    HPI Betty Henderson is a 58 y.o. female.  58 year old female presents with chronic pain in her joints secondary osteoporosis.  Denies any recent history of trauma.  No fever or chills.  States the pain is worse with any movement.  Denies any new joint swelling.  Has not use any treatment prior to arrival     Past Medical History:  Diagnosis Date  . Anemia   . Arthritis   . Chronic pain   . Drug-seeking behavior   . Malingering   . Osteopetrosis   . Psychosis (HCC)   . Schizoaffective disorder, bipolar type (HCC)     There are no active problems to display for this patient.   Past Surgical History:  Procedure Laterality Date  . MOUTH SURGERY    . TUBAL LIGATION       OB History   No obstetric history on file.      Home Medications    Prior to Admission medications   Medication Sig Start Date End Date Taking? Authorizing Provider  doxycycline (VIBRAMYCIN) 100 MG capsule Take 1 capsule (100 mg total) by mouth 2 (two) times daily. 04/24/18   Janne Napoleon, NP  ranitidine (ZANTAC) 150 MG capsule Take 1 capsule (150 mg total) by mouth daily. Patient not taking: Reported on 03/20/2018 05/31/17   Lorre Nick, MD  Throat Lozenges (LOZENGES MT) Use as directed 1 each in the mouth or throat daily as needed (sore throat).    [provider]    Family History Family History  Family history unknown: Yes    Social History Social History   Tobacco Use  . Smoking status: Current Some Day Smoker    Packs/day: 1.00    Types: Cigarettes  . Smokeless tobacco: Never Used  Substance Use Topics  . Alcohol use: No  . Drug use: Yes    Types: Cocaine    Comment: Pt denies      Allergies   Lemon oil; Strawberry extract; and Other   Review of  Systems Review of Systems  All other systems reviewed and are negative.    Physical Exam Updated Vital Signs BP 132/68 (BP Location: Left Arm)   Pulse 87   Temp 97.7 F (36.5 C) (Oral)   Resp 18   SpO2 98%   Physical Exam Vitals signs and nursing note reviewed.  Constitutional:      General: She is not in acute distress.    Appearance: Normal appearance. She is well-developed. She is not toxic-appearing.  HENT:     Head: Normocephalic and atraumatic.  Eyes:     General: Lids are normal.     Conjunctiva/sclera: Conjunctivae normal.     Pupils: Pupils are equal, round, and reactive to light.  Neck:     Musculoskeletal: Normal range of motion and neck supple.     Thyroid: No thyroid mass.     Trachea: No tracheal deviation.  Cardiovascular:     Rate and Rhythm: Normal rate and regular rhythm.     Heart sounds: Normal heart sounds. No murmur. No gallop.   Pulmonary:     Effort: Pulmonary effort is normal. No respiratory distress.     Breath sounds: Normal breath sounds. No stridor. No decreased breath sounds, wheezing, rhonchi or  rales.  Abdominal:     General: Bowel sounds are normal. There is no distension.     Palpations: Abdomen is soft.     Tenderness: There is no abdominal tenderness. There is no rebound.  Musculoskeletal: Normal range of motion.        General: No tenderness.     Comments: Full range of motion at her joints.  No evidence of septic arthritis.  Skin:    General: Skin is warm and dry.     Findings: No abrasion or rash.  Neurological:     Mental Status: She is alert and oriented to person, place, and time.     GCS: GCS eye subscore is 4. GCS verbal subscore is 5. GCS motor subscore is 6.     Cranial Nerves: No cranial nerve deficit.     Sensory: No sensory deficit.  Psychiatric:        Speech: Speech normal.        Behavior: Behavior normal.      ED Treatments / Results  Labs (all labs ordered are listed, but only abnormal results are  displayed) Labs Reviewed - No data to display  EKG None  Radiology No results found.  Procedures Procedures (including critical care time)  Medications Ordered in ED Medications  ibuprofen (ADVIL,MOTRIN) tablet 600 mg (has no administration in time range)     Initial Impression / Assessment and Plan / ED Course  I have reviewed the triage vital signs and the nursing notes.  Pertinent labs & imaging results that were available during my care of the patient were reviewed by me and considered in my medical decision making (see chart for details).     Patient has no evidence of acute joint pathology.  Likely chronic pain from her osteoarthritis.  Given Motrin here and she can follow-up with her doctor  Final Clinical Impressions(s) / ED Diagnoses   Final diagnoses:  None    ED Discharge Orders    None       Lorre NickAllen, Davin Archuletta, MD 04/25/18 [phone]

## 2018-04-25 NOTE — ED Triage Notes (Signed)
Patient states that she has had multiple XRs done in the past. Persistent pain in right arm, bilateral hips, legs and feet. Per patient, hx of osteoporosis and arthritis. Pain 10/10. Seen recently for same.

## 2018-04-27 ENCOUNTER — Other Ambulatory Visit: Payer: Self-pay

## 2018-04-27 ENCOUNTER — Emergency Department (HOSPITAL_COMMUNITY)
Admission: EM | Admit: 2018-04-27 | Discharge: 2018-04-27 | Disposition: A | Discharge status: Home / Self Care | Payer: Self-pay | Attending: Emergency Medicine | Admitting: Emergency Medicine

## 2018-04-27 ENCOUNTER — Encounter (HOSPITAL_COMMUNITY): Payer: Self-pay

## 2018-04-27 DIAGNOSIS — M791 Myalgia, unspecified site: Secondary | ICD-10-CM

## 2018-04-27 DIAGNOSIS — F17210 Nicotine dependence, cigarettes, uncomplicated: Secondary | ICD-10-CM | POA: Insufficient documentation

## 2018-04-27 DIAGNOSIS — M7918 Myalgia, other site: Secondary | ICD-10-CM | POA: Insufficient documentation

## 2018-04-27 DIAGNOSIS — Z79899 Other long term (current) drug therapy: Secondary | ICD-10-CM | POA: Insufficient documentation

## 2018-04-27 NOTE — ED Notes (Addendum)
Patient given coffee and [place] sandwich. Patient also given bus pass.

## 2018-04-27 NOTE — Discharge Instructions (Addendum)
As discussed, your evaluation today has been largely reassuring.  But, it is important that you monitor your condition carefully, and do not hesitate to return to the ED if you develop new, or concerning changes in your condition. ? ?Otherwise, please follow-up with your physician for appropriate ongoing care. ? ?

## 2018-04-27 NOTE — Progress Notes (Signed)
CSW aware of consult as patient has had frequent ED visits. CSW spoke with patient at bedside regarding current situation. Patient hard to follow and kept repeating that she lives at a "[employer]" that is a "family heirloom". CSW attempted to assess situation further but patient just repeated the above statements. CSW inquired about if patient had any family or supports that assist with patient. Per patient, she has two boys but doesn't talk to them often. Patient requested CSW follow up with patient's son Casimiro Needle to see if he could assist with transportation. CSW called number in chart and spoke with Casimiro Needle who stated he last heard from patient over the weekend. Per Casimiro Needle, she was difficult to follow at that time but that this is normal for patient. CSW inquired about where patient was currently living but Casimiro Needle was unsure. Per Casimiro Needle, he is unable to pick up patient. Patient declined needing any resources from CSW. Please reconsult if needs arise.  Archie Balboa, LCSWA  Clinical Social Work Department  [employer]  [phone]

## 2018-04-27 NOTE — ED Provider Notes (Signed)
[facility]-EMERGENCY DEPT Provider Note   CSN: [id number] Arrival date & time: 04/27/18  [id number]     History   Chief Complaint Chief Complaint  Patient presents with  . Back Pain    HPI Candie ChromanDebra Ann Jocson is a 58 y.o. female.  HPI Is a poor historian presents possibly due to back pain. She notes that she always has some discomfort, seemingly due to arthritis, which she acknowledges. It is unclear if she has new pain, or this is atypical phenomena, but she denies any inability to move her legs, new falls, new weakness. She denies new fever, denies vomiting. Patient is a very poor historian, gives a rambling account of her circumstances, but is oriented to place, self, time. However is unclear if she is homeless, or transient, unclear if she has family for support. She states that she does have chronic medical problems, cannot specify what they are, states that she takes medication, but it is unclear if she is actually taking them. Past Medical History:  Diagnosis Date  . Anemia   . Arthritis   . Chronic pain   . Drug-seeking behavior   . Malingering   . Osteopetrosis   . Psychosis (HCC)   . Schizoaffective disorder, bipolar type (HCC)     There are no active problems to display for this patient.   Past Surgical History:  Procedure Laterality Date  . MOUTH SURGERY    . TUBAL LIGATION       OB History   No obstetric history on file.      Home Medications    Prior to Admission medications   Medication Sig Start Date End Date Taking? Authorizing Provider  doxycycline (VIBRAMYCIN) 100 MG capsule Take 1 capsule (100 mg total) by mouth 2 (two) times daily. 04/24/18   Janne NapoleonNeese, Hope M, NP  ranitidine (ZANTAC) 150 MG capsule Take 1 capsule (150 mg total) by mouth daily. Patient not taking: Reported on 03/20/2018 05/31/17   Lorre NickAllen, Anthony, MD  Throat Lozenges (LOZENGES MT) Use as directed 1 each in the mouth or throat daily as needed (sore throat).     [provider]    Family History Family History  Family history unknown: Yes    Social History Social History   Tobacco Use  . Smoking status: Current Some Day Smoker    Packs/day: 1.00    Types: Cigarettes  . Smokeless tobacco: Never Used  Substance Use Topics  . Alcohol use: No  . Drug use: Yes    Types: Cocaine    Comment: Pt denies      Allergies   Lemon oil; Strawberry extract; and Other   Review of Systems Review of Systems  Constitutional:       Per HPI, otherwise negative  HENT:       Per HPI, otherwise negative  Respiratory:       Per HPI, otherwise negative  Cardiovascular:       Per HPI, otherwise negative  Gastrointestinal: Negative for vomiting.  Endocrine:       Negative aside from HPI  Genitourinary:       Neg aside from HPI   Musculoskeletal:       Per HPI, otherwise negative  Skin: Negative for wound.  Neurological: Negative for syncope and weakness.  Psychiatric/Behavioral: Positive for decreased concentration.       Some limitation of ROS given the patient's tangential history provision.     Physical Exam Updated Vital Signs BP 120/80 (BP Location:  Right Arm)   Pulse 78   Temp 98 F (36.7 C) (Oral)   Resp 16   Ht 5\' 6"  (1.676 m)   Wt 64.9 kg   SpO2 98%   BMI 23.08 kg/m   Physical Exam Vitals signs and nursing note reviewed.  Constitutional:      General: She is not in acute distress.    Comments: Frail elderly appearing female awake and alert moving all extremity spontaneously speaking clearly, though with rambling thoughts, tangential responses.  HENT:     Head: Normocephalic and atraumatic.  Eyes:     Conjunctiva/sclera: Conjunctivae normal.  Pulmonary:     Effort: Pulmonary effort is normal. No respiratory distress.  Abdominal:     General: There is no distension.  Skin:    General: Skin is warm and dry.  Neurological:     Mental Status: She is alert and oriented to person, place, and time.     Cranial  Nerves: No cranial nerve deficit.  Psychiatric:        Mood and Affect: Mood is anxious.        Speech: Speech is tangential.      ED Treatments / Results   Procedures Procedures (including critical care time)  Medications Ordered in ED Medications - No data to display   Initial Impression / Assessment and Plan / ED Course  I have reviewed the triage vital signs and the nursing notes.  Pertinent labs & imaging results that were available during my care of the patient were reviewed by me and considered in my medical decision making (see chart for details).    EMR notable for 9 prior ED visits, no recent SW eval.  11:28 AM Patient in no distress. I discussed her case with our social work Acupuncturist, who have seen the patient, discussed with family members who report that this is typical behavior for the patient herself. Without any apparent new findings, without any evidence for acute distress, without any new hemodynamic instability, the patient is appropriate for discharge, encouraged to follow-up with primary care and outpatient behavioral health.  Final Clinical Impressions(s) / ED Diagnoses   Final diagnoses:  Muscle pain     Gerhard Munch, MD 04/27/18 1129

## 2018-04-27 NOTE — ED Notes (Signed)
Ambulated to room without difficulty

## 2018-04-27 NOTE — ED Triage Notes (Signed)
Pt reports back that started at [employer] when she sat [place] a chair [place] the [employer] [place] and they told her to leave. Non traumatic.

## 2018-06-16 ENCOUNTER — Encounter (HOSPITAL_COMMUNITY): Payer: Self-pay | Admitting: *Deleted

## 2018-06-16 ENCOUNTER — Encounter (HOSPITAL_COMMUNITY): Payer: Self-pay | Admitting: Emergency Medicine

## 2018-06-16 ENCOUNTER — Emergency Department (HOSPITAL_COMMUNITY)
Admission: EM | Admit: 2018-06-16 | Discharge: 2018-06-17 | Disposition: A | Discharge status: Home / Self Care | Payer: Self-pay | Attending: Emergency Medicine | Admitting: Emergency Medicine

## 2018-06-16 ENCOUNTER — Other Ambulatory Visit: Payer: Self-pay

## 2018-06-16 ENCOUNTER — Emergency Department (HOSPITAL_COMMUNITY)
Admission: EM | Admit: 2018-06-16 | Discharge: 2018-06-16 | Disposition: A | Discharge status: Home / Self Care | Payer: Self-pay | Attending: Emergency Medicine | Admitting: Emergency Medicine

## 2018-06-16 DIAGNOSIS — F17210 Nicotine dependence, cigarettes, uncomplicated: Secondary | ICD-10-CM | POA: Insufficient documentation

## 2018-06-16 DIAGNOSIS — F259 Schizoaffective disorder, unspecified: Secondary | ICD-10-CM | POA: Insufficient documentation

## 2018-06-16 DIAGNOSIS — G8929 Other chronic pain: Secondary | ICD-10-CM | POA: Insufficient documentation

## 2018-06-16 DIAGNOSIS — M25561 Pain in right knee: Secondary | ICD-10-CM | POA: Insufficient documentation

## 2018-06-16 DIAGNOSIS — M2550 Pain in unspecified joint: Secondary | ICD-10-CM | POA: Insufficient documentation

## 2018-06-16 DIAGNOSIS — Z79899 Other long term (current) drug therapy: Secondary | ICD-10-CM | POA: Insufficient documentation

## 2018-06-16 DIAGNOSIS — M25562 Pain in left knee: Secondary | ICD-10-CM | POA: Insufficient documentation

## 2018-06-16 MED ORDER — KETOROLAC TROMETHAMINE 30 MG/ML IJ SOLN
30.0000 mg | Freq: Once | INTRAMUSCULAR | Status: AC
  Administered 2018-06-16: 30 mg via INTRAMUSCULAR
  Filled 2018-06-16: qty 1

## 2018-06-16 NOTE — ED Provider Notes (Signed)
Emergency Department Provider Note   I have reviewed the triage vital signs and the nursing notes.   HISTORY  Chief Complaint Knee Pain   HPI Betty Henderson is a 58 y.o. female with PMH of arthritis, chronic pain, and Schizoaffective disorder presents to the ED with bilateral knee pain.  Patient reports worsening pain in the knees since getting cold outside with the rain.  Patient has had this pain in the past but states it is worsening.  She tells me she does not have money to buy Tylenol and/or Motrin.  She was seen at an outside emergency department for the same last night but does not believe she was given any medication.  She has not had fevers or chills.  No chest pain or shortness of breath.  Patient tells me she plans on moving to [place] and is saving up money for a bus ticket.   Past Medical History:  Diagnosis Date  . Anemia   . Arthritis   . Chronic pain   . Drug-seeking behavior   . Malingering   . Osteopetrosis   . Psychosis (HCC)   . Schizoaffective disorder, bipolar type (HCC)     There are no active problems to display for this patient.   Past Surgical History:  Procedure Laterality Date  . MOUTH SURGERY    . TUBAL LIGATION     Allergies Lemon oil; Strawberry extract; and Other  Family History  Family history unknown: Yes    Social History Social History   Tobacco Use  . Smoking status: Current Some Day Smoker    Packs/day: 1.00    Types: Cigarettes  . Smokeless tobacco: Never Used  Substance Use Topics  . Alcohol use: No  . Drug use: Yes    Types: Cocaine    Comment: Pt denies     Review of Systems  Constitutional: No fever/chills Eyes: No visual changes. ENT: No sore throat. Cardiovascular: Denies chest pain. Respiratory: Denies shortness of breath. Gastrointestinal: No abdominal pain.  No nausea, no vomiting.  No diarrhea.  No constipation. Genitourinary: Negative for dysuria. Musculoskeletal: Negative for back pain.  Bilateral knee pain.  Skin: Negative for rash. Neurological: Negative for headaches, focal weakness or numbness.  10-point ROS otherwise negative.  ____________________________________________   PHYSICAL EXAM:  VITAL SIGNS: ED Triage Vitals  Enc Vitals Group     BP 06/16/18 1915 109/68     Pulse Rate 06/16/18 1915 85     Resp 06/16/18 1915 16     Temp 06/16/18 1915 97.8 F (36.6 C)     Temp Source 06/16/18 1915 Oral     SpO2 06/16/18 1915 95 %     Pain Score 06/16/18 1914 10   Constitutional: Alert and oriented. Well appearing and in no acute distress. Eyes: Conjunctivae are normal.  Head: Atraumatic. Nose: No congestion/rhinnorhea. Mouth/Throat: Mucous membranes are moist. Neck: No stridor.  Cardiovascular: Normal rate, regular rhythm. Respiratory: Normal respiratory effort.   Gastrointestinal: No distention.  Musculoskeletal: No lower extremity tenderness nor edema. No gross deformities of extremities. Normal ROM of bilateral hips, knees, and ankles.  Neurologic:  Normal speech and language. No gross focal neurologic deficits are appreciated.  Skin:  Skin is warm, dry and intact. No rash noted.  ____________________________________________  RADIOLOGY  None  ____________________________________________   PROCEDURES  Procedure(s) performed:   Procedures  None  ____________________________________________   INITIAL IMPRESSION / ASSESSMENT AND PLAN / ED COURSE  Pertinent labs & imaging results that were available  during my care of the patient were reviewed by me and considered in my medical decision making (see chart for details).  Patient with chronic pain presents with bilateral knee pain. Normal exam. No septic joint. No injury. Plan for Toradol here and discharge. Patient awake, alert, and in no distress. Stable for discharge. Gave contact information for the [facility] locally.    ____________________________________________  FINAL CLINICAL IMPRESSION(S) /  ED DIAGNOSES  Final diagnoses:  Chronic pain of both knees    MEDICATIONS GIVEN DURING THIS VISIT:  Medications  ketorolac (TORADOL) 30 MG/ML injection 30 mg (has no administration in time range)    Note:  This document was prepared using Dragon voice recognition software and may include unintentional dictation errors.  Alona Bene, MD Emergency Medicine    Long, Arlyss Repress, MD 06/16/18 Serena Croissant

## 2018-06-16 NOTE — Discharge Instructions (Signed)

## 2018-06-16 NOTE — ED Triage Notes (Signed)
C/o generalized pain all over "due to the rain".  Pt discharged from [facility] today for bilateral knee pain due to the rain.

## 2018-06-16 NOTE — ED Triage Notes (Signed)
Pt arrives via EMS with arthritis pain in bilateral knees since it started getting cold outside and raining. Evaluated for the same last night in another facility ED.

## 2018-06-17 MED ORDER — ACETAMINOPHEN 325 MG PO TABS
650.0000 mg | ORAL_TABLET | Freq: Once | ORAL | Status: AC
  Administered 2018-06-17: 650 mg via ORAL
  Filled 2018-06-17: qty 2

## 2018-06-17 NOTE — ED Notes (Signed)
Patient verbalizes understanding of medications and discharge instructions. No further questions at this time. VSS and patient ambulatory at discharge.   

## 2018-06-17 NOTE — ED Notes (Signed)
Patient reports bilateral knee pain when walking. Patient ambulated to room with steady gait noted at this time.

## 2018-06-17 NOTE — ED Provider Notes (Signed)
[facility] EMERGENCY DEPARTMENT Provider Note   CSN: [id number] Arrival date & time: 06/16/18  2322    History   Chief Complaint Chief Complaint  Patient presents with  . Generalized Body Aches    HPI Angelea Mcnell is a 58 y.o. female.     Patient presents to the emergency department with a chief complaint of arthralgias.  She was just discharged from [facility] long emergency department a few hours ago for the same.  She states that the weather is causing her arthritis to act up.  She was given a Toradol shot at [place] with some relief.  She states that she has not been able to follow-up with her doctor, but will make an appointment tomorrow.  She denies any fevers, chills, cough, shortness of breath.  Denies any other associated symptoms.  The history is provided by the patient. No language interpreter was used.    Past Medical History:  Diagnosis Date  . Anemia   . Arthritis   . Chronic pain   . Drug-seeking behavior   . Malingering   . Osteopetrosis   . Psychosis (HCC)   . Schizoaffective disorder, bipolar type (HCC)     There are no active problems to display for this patient.   Past Surgical History:  Procedure Laterality Date  . MOUTH SURGERY    . TUBAL LIGATION       OB History   No obstetric history on file.      Home Medications    Prior to Admission medications   Medication Sig Start Date End Date Taking? Authorizing Provider  doxycycline (VIBRAMYCIN) 100 MG capsule Take 1 capsule (100 mg total) by mouth 2 (two) times daily. 04/24/18   Janne Napoleon, NP  ranitidine (ZANTAC) 150 MG capsule Take 1 capsule (150 mg total) by mouth daily. Patient not taking: Reported on 03/20/2018 05/31/17   Lorre Nick, MD  Throat Lozenges (LOZENGES MT) Use as directed 1 each in the mouth or throat daily as needed (sore throat).    [provider]    Family History Family History  Family history unknown: Yes    Social History  Social History   Tobacco Use  . Smoking status: Current Some Day Smoker    Packs/day: 1.00    Types: Cigarettes  . Smokeless tobacco: Never Used  Substance Use Topics  . Alcohol use: No  . Drug use: Yes    Types: Cocaine    Comment: Pt denies      Allergies   Lemon oil; Strawberry extract; and Other   Review of Systems Review of Systems  All other systems reviewed and are negative.    Physical Exam Updated Vital Signs BP 132/74 (BP Location: Right Arm)   Pulse 74   Temp (!) 97.5 F (36.4 C) (Oral)   Resp 16   SpO2 99%   Physical Exam Vitals signs and nursing note reviewed.  Constitutional:      Appearance: She is well-developed.  HENT:     Head: Normocephalic and atraumatic.  Eyes:     Conjunctiva/sclera: Conjunctivae normal.     Pupils: Pupils are equal, round, and reactive to light.  Neck:     Musculoskeletal: Normal range of motion and neck supple.  Cardiovascular:     Rate and Rhythm: Normal rate and regular rhythm.     Heart sounds: No murmur. No friction rub. No gallop.   Pulmonary:     Effort: Pulmonary effort is normal. No  respiratory distress.     Breath sounds: Normal breath sounds. No wheezing or rales.  Chest:     Chest wall: No tenderness.  Abdominal:     General: Bowel sounds are normal. There is no distension.     Palpations: Abdomen is soft. There is no mass.     Tenderness: There is no abdominal tenderness. There is no guarding or rebound.  Musculoskeletal: Normal range of motion.        General: No tenderness.     Comments: Range of motion of bilateral upper and lower extremities is 5/5, strength is normal  Skin:    General: Skin is warm and dry.     Comments: No rashes, no erythema, no evidence of infection  Neurological:     Mental Status: She is alert and oriented to person, place, and time.  Psychiatric:        Behavior: Behavior normal.        Thought Content: Thought content normal.        Judgment: Judgment normal.       ED Treatments / Results  Labs (all labs ordered are listed, but only abnormal results are displayed) Labs Reviewed - No data to display  EKG None  Radiology No results found.  Procedures Procedures (including critical care time)  Medications Ordered in ED Medications  acetaminophen (TYLENOL) tablet 650 mg (has no administration in time range)     Initial Impression / Assessment and Plan / ED Course  I have reviewed the triage vital signs and the nursing notes.  Pertinent labs & imaging results that were available during my care of the patient were reviewed by me and considered in my medical decision making (see chart for details).        Patient with complaints of generalized arthralgias.  Seen a few hours ago at [facility].  Vital signs are stable.  Patient is in no acute distress.  Will give a dose of Tylenol and recommend PCP follow-up.  Final Clinical Impressions(s) / ED Diagnoses   Final diagnoses:  Arthralgia, unspecified joint    ED Discharge Orders    None       Roxy Horseman, PA-C 06/17/18 0113    Dione Booze, MD 06/17/18 [phone]

## 2018-09-14 DIAGNOSIS — F17200 Nicotine dependence, unspecified, uncomplicated: Secondary | ICD-10-CM | POA: Insufficient documentation

## 2018-10-12 DIAGNOSIS — G3184 Mild cognitive impairment, so stated: Secondary | ICD-10-CM | POA: Insufficient documentation

## 2019-01-06 ENCOUNTER — Other Ambulatory Visit: Payer: Self-pay

## 2019-01-06 ENCOUNTER — Encounter (HOSPITAL_COMMUNITY): Payer: Self-pay | Admitting: *Deleted

## 2019-01-06 DIAGNOSIS — F17210 Nicotine dependence, cigarettes, uncomplicated: Secondary | ICD-10-CM | POA: Insufficient documentation

## 2019-01-06 DIAGNOSIS — Z590 Homelessness: Secondary | ICD-10-CM | POA: Insufficient documentation

## 2019-01-06 DIAGNOSIS — R062 Wheezing: Secondary | ICD-10-CM | POA: Insufficient documentation

## 2019-01-06 LAB — COMPREHENSIVE METABOLIC PANEL
ALT: 12 U/L (ref 0–44)
AST: 15 U/L (ref 15–41)
Albumin: 4 g/dL (ref 3.5–5.0)
Alkaline Phosphatase: 67 U/L (ref 38–126)
Anion gap: 10 (ref 5–15)
BUN: 22 mg/dL — ABNORMAL HIGH (ref 6–20)
CO2: 24 mmol/L (ref 22–32)
Calcium: 9.5 mg/dL (ref 8.9–10.3)
Chloride: 106 mmol/L (ref 98–111)
Creatinine, Ser: 0.87 mg/dL (ref 0.44–1.00)
GFR calc Af Amer: >60 mL/min (ref >60)
GFR calc non Af Amer: >60 mL/min (ref >60)
Glucose, Bld: 143 mg/dL — ABNORMAL HIGH (ref 70–99)
Potassium: 4.2 mmol/L (ref 3.5–5.1)
Sodium: 140 mmol/L (ref 135–145)
Total Bilirubin: 0.4 mg/dL (ref 0.3–1.2)
Total Protein: 7.6 g/dL (ref 6.5–8.1)

## 2019-01-06 LAB — CBC
HCT: 41.6 % (ref 36.0–46.0)
Hemoglobin: 13 g/dL (ref 12.0–15.0)
MCH: 29.9 pg (ref 26.0–34.0)
MCHC: 31.3 g/dL (ref 30.0–36.0)
MCV: 95.6 fL (ref 80.0–100.0)
Platelets: 283 10*3/uL (ref 150–400)
RBC: 4.35 MIL/uL (ref 3.87–5.11)
RDW: 13.2 % (ref 11.5–15.5)
WBC: 7.2 10*3/uL (ref 4.0–10.5)
nRBC: 0 % (ref 0.0–0.2)

## 2019-01-06 LAB — I-STAT BETA HCG BLOOD, ED (MC, WL, AP ONLY): I-stat hCG, quantitative: <5 m[IU]/mL (ref <5)

## 2019-01-06 LAB — LIPASE, BLOOD: Lipase: 51 U/L (ref 11–51)

## 2019-01-06 MED ORDER — SODIUM CHLORIDE 0.9% FLUSH: 3.0000 mL | Freq: Once | INTRAVENOUS | Status: DC

## 2019-01-06 NOTE — ED Triage Notes (Signed)
Pt arrives via [facility] with c/o "infection all over" her body. States that she needs her prescription. 118/82, hr 85, r 18, 94% RA.

## 2019-01-06 NOTE — ED Triage Notes (Signed)
Pt reporting she was at the hospital for infection and d/c. Unable to report what type of specific infections. Reporting she is just not feeling well and having diarrhea.

## 2019-01-07 ENCOUNTER — Emergency Department (HOSPITAL_COMMUNITY): Payer: Self-pay

## 2019-01-07 ENCOUNTER — Emergency Department (HOSPITAL_COMMUNITY)
Admission: EM | Admit: 2019-01-07 | Discharge: 2019-01-07 | Disposition: A | Discharge status: Home / Self Care | Payer: Self-pay | Attending: Emergency Medicine | Admitting: Emergency Medicine

## 2019-01-07 ENCOUNTER — Other Ambulatory Visit: Payer: Self-pay

## 2019-01-07 DIAGNOSIS — Z5900 Homelessness unspecified: Secondary | ICD-10-CM

## 2019-01-07 DIAGNOSIS — F17210 Nicotine dependence, cigarettes, uncomplicated: Secondary | ICD-10-CM | POA: Insufficient documentation

## 2019-01-07 DIAGNOSIS — M79652 Pain in left thigh: Secondary | ICD-10-CM | POA: Insufficient documentation

## 2019-01-07 DIAGNOSIS — R062 Wheezing: Secondary | ICD-10-CM | POA: Insufficient documentation

## 2019-01-07 DIAGNOSIS — Z765 Malingerer [conscious simulation]: Secondary | ICD-10-CM | POA: Insufficient documentation

## 2019-01-07 DIAGNOSIS — G8929 Other chronic pain: Secondary | ICD-10-CM | POA: Insufficient documentation

## 2019-01-07 DIAGNOSIS — M79605 Pain in left leg: Secondary | ICD-10-CM

## 2019-01-07 DIAGNOSIS — F250 Schizoaffective disorder, bipolar type: Secondary | ICD-10-CM | POA: Insufficient documentation

## 2019-01-07 MED ORDER — AEROCHAMBER Z-STAT PLUS/MEDIUM MISC
1.0000 | Freq: Once | Status: AC
  Administered 2019-01-07: 1
  Filled 2019-01-07: qty 1

## 2019-01-07 MED ORDER — ALBUTEROL SULFATE HFA 108 (90 BASE) MCG/ACT IN AERS
8.0000 | INHALATION_SPRAY | Freq: Once | RESPIRATORY_TRACT | Status: AC
  Administered 2019-01-07: 8 via RESPIRATORY_TRACT
  Filled 2019-01-07: qty 6.7

## 2019-01-07 NOTE — ED Notes (Signed)
Called for recheck of V/S x1 No answer 

## 2019-01-07 NOTE — ED Provider Notes (Signed)
[facility] DEPT Provider Note   CSN: [id number] Arrival date & time: 01/06/19  2153     History   Chief Complaint Chief Complaint  Patient presents with  . Diarrhea    HPI Betty Henderson is a 58 y.o. female with a history of drug-seeking behavior, schizoaffective disorder, psychosis, malingering, and chronic pain who presents to the emergency department by EMS with multiple complaints.  She is well-known to this ER as well as other ERs throughout the region.  This marks her 12th ER visit since August 2020  She initially states to triage RN that she has "infection all over her body" and needs her prescription.  She states that she was recently at the hospital for an infection and was discharged.  She does not recall the type of infection.  She states that she is just not feeling well and has been having diarrhea.   She later states to RN that she ran out of her prescription but she cannot remember what it was.  She also reports that she is not actively having diarrhea and last episode of diarrhea was a week ago.  On my evaluation, the patient is unable to state while she is at the ER, but later states that "we need to tell her why she keeps passing out at bus stops for long periods of time."  She is unable to state when the last time that she had an episode.  She currently denies chest pain, shortness of breath, cough, fever, chills, abdominal pain, nausea, vomiting, diarrhea.       The history is provided by the patient. No language interpreter was used.    Past Medical History:  Diagnosis Date  . Anemia   . Arthritis   . Chronic pain   . Drug-seeking behavior   . Malingering   . Osteopetrosis   . Psychosis ([place])   . Schizoaffective disorder, bipolar type ([place])     There are no active problems to display for this patient.   Past Surgical History:  Procedure Laterality Date  . MOUTH SURGERY    . TUBAL LIGATION       OB History    No obstetric history on file.      Home Medications    Prior to Admission medications   Medication Sig Start Date End Date Taking? Authorizing Provider  doxycycline (VIBRAMYCIN) 100 MG capsule Take 1 capsule (100 mg total) by mouth 2 (two) times daily. 04/24/18   Ashley Murrain, NP  ranitidine (ZANTAC) 150 MG capsule Take 1 capsule (150 mg total) by mouth daily. Patient not taking: Reported on 03/20/2018 05/31/17   Lacretia Leigh, MD  Throat Lozenges (LOZENGES MT) Use as directed 1 each in the mouth or throat daily as needed (sore throat).    [provider]    Family History Family History  Family history unknown: Yes    Social History Social History   Tobacco Use  . Smoking status: Current Some Day Smoker    Packs/day: 1.00    Types: Cigarettes  . Smokeless tobacco: Never Used  Substance Use Topics  . Alcohol use: No  . Drug use: Yes    Types: Cocaine    Comment: Pt denies      Allergies   Lemon oil, Strawberry extract, and Other   Review of Systems Review of Systems  Constitutional: Negative for activity change, chills and fever.  HENT: Negative for congestion.   Respiratory: Negative for shortness of breath  and wheezing.   Cardiovascular: Negative for chest pain and palpitations.  Gastrointestinal: Positive for diarrhea (resolved). Negative for abdominal pain, nausea and vomiting.  Genitourinary: Negative for dysuria and urgency.  Musculoskeletal: Negative for back pain.  Skin: Negative for rash.  Allergic/Immunologic: Negative for immunocompromised state.  Neurological: Negative for syncope, weakness and headaches.  Psychiatric/Behavioral: Negative for confusion.     Physical Exam Updated Vital Signs BP 124/78   Pulse 65   Temp 98 F (36.7 C) (Oral)   Resp 16   Ht 5\' 6"  (1.676 m)   Wt 72.6 kg   SpO2 100%   BMI 25.82 kg/m   Physical Exam Vitals signs and nursing note reviewed.  Constitutional:      General: She is not in acute  distress.    Comments: Sleeping comfortably and in no acute distress on arrival.  Rouses easily to voice.  HENT:     Head: Normocephalic.     Comments: Poor dentition Eyes:     Extraocular Movements: Extraocular movements intact.     Conjunctiva/sclera: Conjunctivae normal.  Neck:     Musculoskeletal: Neck supple.  Cardiovascular:     Rate and Rhythm: Normal rate and regular rhythm.     Pulses: Normal pulses.     Heart sounds: Normal heart sounds. No murmur. No friction rub. No gallop.   Pulmonary:     Effort: Pulmonary effort is normal. No respiratory distress.     Breath sounds: No stridor. Wheezing present. No rhonchi.     Comments: And expiratory wheezes in all fields bilaterally.  No rhonchi or rales.  No increased work of breathing. Chest:     Chest wall: No tenderness.  Abdominal:     General: There is no distension.     Palpations: Abdomen is soft. There is no mass.     Tenderness: There is no abdominal tenderness. There is no right CVA tenderness, left CVA tenderness, guarding or rebound.     Hernia: No hernia is present.     Comments: Abdomen is soft and nontender.  No distention.  Skin:    General: Skin is warm.     Capillary Refill: Capillary refill takes less than 2 seconds.     Findings: No rash.  Neurological:     General: No focal deficit present.     Mental Status: She is alert.  Psychiatric:        Behavior: Behavior normal.      ED Treatments / Results  Labs (all labs ordered are listed, but only abnormal results are displayed) Labs Reviewed  COMPREHENSIVE METABOLIC PANEL - Abnormal; Notable for the following components:      Result Value   Glucose, Bld 143 (*)    BUN 22 (*)    All other components within normal limits  LIPASE, BLOOD  CBC  I-STAT BETA HCG BLOOD, ED ([facility], [facility], [facility] ONLY)    EKG None  Radiology No results found.  Procedures Procedures (including critical care time)  Medications Ordered in ED Medications  sodium chloride  flush (NS) 0.9 % injection 3 mL (3 mLs Intravenous Not Given 01/07/19 0615)  albuterol (VENTOLIN HFA) 108 (90 Base) MCG/ACT inhaler 8 puff (8 puffs Inhalation Given 01/07/19 0636)  aerochamber Z-Stat Plus/medium 1 each (1 each Other Given 01/07/19 0636)     Initial Impression / Assessment and Plan / ED Course  I have reviewed the triage vital signs and the nursing notes.  Pertinent labs & imaging results that were available during  my care of the patient were reviewed by me and considered in my medical decision making (see chart for details).        58 year old female with a history of drug-seeking behavior, schizoaffective disorder, psychosis, malingering, and chronic pain who is well-known to the ER with 12 visits at multiple emergency departments in the region since August 2019.  Vital signs are normal.  Physical exam is unremarkable aside from end expiratory wheezes in all fields bilaterally.  Labs have been reviewed and are reassuring.  She has been given an albuterol inhaler and spacer for wheezing.  She initially stated that she was having diarrhea, but later states this resolved about a week ago.  She has no electrolyte derangements on her labs.  We will give the patient [facility].  She will be discharged with albuterol inhaler and spacer.  She is hemodynamically stable and in no acute distress.  Safe for discharge home with outpatient follow-up as needed.  Final Clinical Impressions(s) / ED Diagnoses   Final diagnoses:  Wheezing  Homelessness    ED Discharge Orders    None       Barkley BoardsMcDonald, Marie Chow A, PA-C 01/07/19 0834    Palumbo, April, MD 01/07/19 2340
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
## 2019-01-07 NOTE — ED Notes (Addendum)
Patient states she ran out of her prescription. She can't remember what it was for. Pt states the last time she had diarrhea was about a week ago.

## 2019-01-07 NOTE — ED Notes (Signed)
Patient informed about need for urine sample and provided with urine cup.

## 2019-01-07 NOTE — Discharge Instructions (Signed)
Take Ibuprofen or Tylenol for pain Please make an appointment with a primary doctor for refills on your medicines

## 2019-01-07 NOTE — ED Notes (Signed)
Patient verbalizes understanding of discharge instructions. Opportunity for questioning and answers were provided. Armband removed by staff, pt discharged from ED ambulatory by self\  

## 2019-01-07 NOTE — Discharge Instructions (Addendum)
Thank you for allowing me to care for you today in the Emergency Department.   Use 2 puffs of albuterol inhaler with the spacer for wheezing.   You can follow-up with [facility] and wellness state established with a primary care provider since you do not have medical insurance.  Return to the emergency department if you develop respiratory distress, persistent vomiting, or other new, concerning symptoms.

## 2019-01-07 NOTE — ED Triage Notes (Signed)
Pt presents with bilatereal leg pain x3-4 years, worsens when it gets cold or rains.

## 2019-01-09 ENCOUNTER — Emergency Department (HOSPITAL_COMMUNITY)
Admission: EM | Admit: 2019-01-09 | Discharge: 2019-01-09 | Disposition: A | Discharge status: Home / Self Care | Payer: Self-pay | Attending: Emergency Medicine | Admitting: Emergency Medicine

## 2019-01-09 ENCOUNTER — Encounter (HOSPITAL_COMMUNITY): Payer: Self-pay | Admitting: Emergency Medicine

## 2019-01-09 ENCOUNTER — Other Ambulatory Visit: Payer: Self-pay

## 2019-01-09 DIAGNOSIS — F17210 Nicotine dependence, cigarettes, uncomplicated: Secondary | ICD-10-CM | POA: Insufficient documentation

## 2019-01-09 DIAGNOSIS — Z590 Homelessness: Secondary | ICD-10-CM | POA: Insufficient documentation

## 2019-01-09 DIAGNOSIS — F1410 Cocaine abuse, uncomplicated: Secondary | ICD-10-CM | POA: Insufficient documentation

## 2019-01-09 DIAGNOSIS — G8929 Other chronic pain: Secondary | ICD-10-CM | POA: Insufficient documentation

## 2019-01-09 DIAGNOSIS — M25561 Pain in right knee: Secondary | ICD-10-CM | POA: Insufficient documentation

## 2019-01-09 DIAGNOSIS — M25562 Pain in left knee: Secondary | ICD-10-CM | POA: Insufficient documentation

## 2019-01-09 DIAGNOSIS — M1990 Unspecified osteoarthritis, unspecified site: Secondary | ICD-10-CM | POA: Insufficient documentation

## 2019-01-09 MED ORDER — IBUPROFEN 400 MG PO TABS
600.0000 mg | ORAL_TABLET | Freq: Once | ORAL | Status: AC
  Administered 2019-01-09: 600 mg via ORAL
  Filled 2019-01-09: qty 1

## 2019-01-09 MED ORDER — IBUPROFEN 200 MG PO TABS
400.0000 mg | ORAL_TABLET | Freq: Once | ORAL | Status: AC
  Administered 2019-01-09: 23:00:00 400 mg via ORAL
  Filled 2019-01-09: qty 2

## 2019-01-09 MED ORDER — IBUPROFEN 600 MG PO TABS: 600.0000 mg | ORAL_TABLET | Freq: Four times a day (QID) | ORAL | 0 refills | Status: DC | PRN

## 2019-01-09 NOTE — ED Provider Notes (Signed)
[facility] EMERGENCY DEPARTMENT Provider Note   CSN: [id number] Arrival date & time: 01/09/19  0344     History   Chief Complaint Joint pain  HPI Betty Henderson is a 58 y.o. female with a history of schizoaffective disorder, psychosis, arthritis, presenting to the emergency department complaining of joint pain.  She reports that she has been having worsening pain in her bilateral knees and ankles, noting that "the cold weather in the rain makes it worse".  She states she has been on her feet a lot the past several days as well and is often times homeless.  She believes making her pain worse.  She denies any falls or traumas.  She denies any fevers or chills or swelling of the joints.  She would like a prescription for her "pain medicine".  She is unable to recall what she was given.     HPI  Past Medical History:  Diagnosis Date  . Anemia   . Arthritis   . Chronic pain   . Drug-seeking behavior   . Malingering   . Osteopetrosis   . Psychosis ([facility])   . Schizoaffective disorder, bipolar type ([facility])     There are no active problems to display for this patient.   Past Surgical History:  Procedure Laterality Date  . MOUTH SURGERY    . TUBAL LIGATION       OB History   No obstetric history on file.      Home Medications    Prior to Admission medications   Medication Sig Start Date End Date Taking? Authorizing Provider  ibuprofen (ADVIL) 600 MG tablet Take 1 tablet (600 mg total) by mouth every 6 (six) hours as needed for up to 24 doses for mild pain or moderate pain. Do NOT take more than prescribed. 01/09/19   Terald Sleeper, MD  Throat Lozenges (LOZENGES MT) Use as directed 1 each in the mouth or throat daily as needed (sore throat).    [provider]  ranitidine (ZANTAC) 150 MG capsule Take 1 capsule (150 mg total) by mouth daily. Patient not taking: Reported on 03/20/2018 05/31/17 01/07/19  Lorre Nick, MD    Family History  Family History  Family history unknown: Yes    Social History Social History   Tobacco Use  . Smoking status: Current Some Day Smoker    Packs/day: 1.00    Types: Cigarettes  . Smokeless tobacco: Never Used  Substance Use Topics  . Alcohol use: No  . Drug use: Yes    Types: Cocaine    Comment: Pt denies      Allergies   Lemon oil, Strawberry extract, and Other   Review of Systems Review of Systems  Constitutional: Negative for chills and fever.  Respiratory: Negative for cough and shortness of breath.   Musculoskeletal: Positive for arthralgias and myalgias.  Skin: Negative for pallor and rash.  Neurological: Negative for weakness and numbness.  All other systems reviewed and are negative.    Physical Exam Updated Vital Signs BP 113/81   Pulse 61   Temp 97.8 F (36.6 C) (Oral)   Resp 17   SpO2 95%   Physical Exam Vitals signs and nursing note reviewed.  Constitutional:      General: She is not in acute distress.    Appearance: She is well-developed.     Comments: Dissheveled, eyes bloodshot  HENT:     Head: Normocephalic and atraumatic.  Neck:     Musculoskeletal: Neck  supple.  Cardiovascular:     Rate and Rhythm: Normal rate and regular rhythm.     Pulses: Normal pulses.  Pulmonary:     Effort: Pulmonary effort is normal. No respiratory distress.  Musculoskeletal:        General: No swelling, tenderness, deformity or signs of injury.     Right lower leg: No edema.     Left lower leg: No edema.     Comments: Full ROM at the joints  Skin:    General: Skin is warm and dry.  Neurological:     Mental Status: She is alert.      ED Treatments / Results  Labs (all labs ordered are listed, but only abnormal results are displayed) Labs Reviewed - No data to display  EKG None  Radiology No results found.  Procedures Procedures (including critical care time)  Medications Ordered in ED Medications  ibuprofen (ADVIL) tablet 600 mg (600 mg  Oral Given 01/09/19 0806)     Initial Impression / Assessment and Plan / ED Course  I have reviewed the triage vital signs and the nursing notes.  Pertinent labs & imaging results that were available during my care of the patient were reviewed by me and considered in my medical decision making (see chart for details).   58 year old female with history as noted above presented to the emergency department with joint pain.  She has a history of arthritis and her presentation is consistent with arthritis.  She wants a refill of her "pain medicine".  Not appear she was prescribed anything more than ibuprofen recently for this.  I will give her dose of ibuprofen here as well as a short-term course for several days.  I am concerned prescribe her long-term ibuprofen given her history of psychosis and her risk for misusing this medication.  She had blood work 3 days ago which demonstrated a normal creatinine level (0.87).  She states that she has Medicare and will be able to fill my prescription.\  This note was dictated using dragon dictation software.  Please be aware that there may be minor translation errors as a result of this oral dictation    Final Clinical Impressions(s) / ED Diagnoses   Final diagnoses:  Arthritis    ED Discharge Orders         Ordered    ibuprofen (ADVIL) 600 MG tablet  Every 6 hours PRN     01/09/19 0853           Wyvonnia Dusky, MD 01/09/19 1757

## 2019-01-09 NOTE — ED Provider Notes (Signed)
[facility]-EMERGENCY DEPT Provider Note   CSN: [id number] Arrival date & time: 01/09/19  2018     History   Chief Complaint Chief Complaint  Patient presents with  . Fatigue    HPI Betty Henderson is a 58 y.o. female.     HPI Patient with history of arthritis and chronic knee pain.  Was seen in the emergency department this morning at [facility].  States she is continued to have knee pain.  She did not get her prescription filled.  No new swelling, redness or warmth.  No new trauma. Past Medical History:  Diagnosis Date  . Anemia   . Arthritis   . Chronic pain   . Drug-seeking behavior   . Malingering   . Osteopetrosis   . Psychosis ([facility])   . Schizoaffective disorder, bipolar type ([facility])     There are no active problems to display for this patient.   Past Surgical History:  Procedure Laterality Date  . MOUTH SURGERY    . TUBAL LIGATION       OB History   No obstetric history on file.      Home Medications    Prior to Admission medications   Medication Sig Start Date End Date Taking? Authorizing Provider  ibuprofen (ADVIL) 600 MG tablet Take 1 tablet (600 mg total) by mouth every 6 (six) hours as needed for up to 24 doses for mild pain or moderate pain. Do NOT take more than prescribed. 01/09/19   Terald Sleeper, MD  Throat Lozenges (LOZENGES MT) Use as directed 1 each in the mouth or throat daily as needed (sore throat).    [provider]  ranitidine (ZANTAC) 150 MG capsule Take 1 capsule (150 mg total) by mouth daily. Patient not taking: Reported on 03/20/2018 05/31/17 01/07/19  Lorre Nick, MD    Family History Family History  Family history unknown: Yes    Social History Social History   Tobacco Use  . Smoking status: Current Some Day Smoker    Packs/day: 1.00    Types: Cigarettes  . Smokeless tobacco: Never Used  Substance Use Topics  . Alcohol use: No  . Drug use: Yes    Types: Cocaine    Comment:  Pt denies      Allergies   Lemon oil, Strawberry extract, and Other   Review of Systems Review of Systems  Constitutional: Negative for chills and fever.  Respiratory: Negative for shortness of breath.   Cardiovascular: Negative for chest pain and leg swelling.  Gastrointestinal: Negative for abdominal pain, nausea and vomiting.  Musculoskeletal: Positive for arthralgias. Negative for myalgias and neck pain.  Skin: Negative for rash and wound.  Neurological: Negative for dizziness, weakness, light-headedness, numbness and headaches.  All other systems reviewed and are negative.    Physical Exam Updated Vital Signs BP 122/87 (BP Location: Right Arm)   Pulse 69   Temp 98 F (36.7 C) (Oral)   Resp 18   SpO2 100%   Physical Exam Vitals signs and nursing note reviewed.  Constitutional:      Appearance: Normal appearance. She is well-developed.     Comments: Disheveled  HENT:     Head: Normocephalic and atraumatic.  Eyes:     Pupils: Pupils are equal, round, and reactive to light.  Neck:     Musculoskeletal: Normal range of motion and neck supple. No neck rigidity or muscular tenderness.  Cardiovascular:     Rate and Rhythm: Normal rate.  Pulmonary:     Effort: Pulmonary effort is normal.  Abdominal:     Palpations: Abdomen is soft.     Tenderness: There is no abdominal tenderness. There is no guarding or rebound.  Musculoskeletal: Normal range of motion.        General: No swelling, tenderness, deformity or signs of injury.     Right lower leg: No edema.     Left lower leg: No edema.     Comments: Full range of motion of bilateral knees.  No obvious effusions, erythema or warmth.  Distal pulses intact.  Lymphadenopathy:     Cervical: No cervical adenopathy.  Skin:    General: Skin is warm and dry.     Capillary Refill: Capillary refill takes less than 2 seconds.     Findings: No erythema or rash.  Neurological:     General: No focal deficit present.     Mental  Status: She is alert and oriented to person, place, and time.  Psychiatric:        Behavior: Behavior normal.      ED Treatments / Results  Labs (all labs ordered are listed, but only abnormal results are displayed) Labs Reviewed - No data to display  EKG None  Radiology No results found.  Procedures Procedures (including critical care time)  Medications Ordered in ED Medications  ibuprofen (ADVIL) tablet 400 mg (has no administration in time range)     Initial Impression / Assessment and Plan / ED Course  I have reviewed the triage vital signs and the nursing notes.  Pertinent labs & imaging results that were available during my care of the patient were reviewed by me and considered in my medical decision making (see chart for details).        We will give patient dose of ibuprofen here.  She is encouraged to fill her prescription.  Believe imaging is necessary at this point.  Return precautions given.  Final Clinical Impressions(s) / ED Diagnoses   Final diagnoses:  Chronic pain of both knees    ED Discharge Orders    None       Julianne Rice, MD 01/09/19 2310

## 2019-01-09 NOTE — ED Triage Notes (Signed)
PT reports feeing tired. No other complaints at this time.

## 2019-01-09 NOTE — ED Triage Notes (Signed)
Pt here with c/o bil leg pain that she has been seen for multiple trimers has not got scripts filled

## 2019-01-11 ENCOUNTER — Other Ambulatory Visit: Payer: Self-pay

## 2019-01-11 ENCOUNTER — Emergency Department (HOSPITAL_COMMUNITY)
Admission: EM | Admit: 2019-01-11 | Discharge: 2019-01-11 | Disposition: A | Discharge status: Home / Self Care | Payer: Self-pay | Attending: Emergency Medicine | Admitting: Emergency Medicine

## 2019-01-11 DIAGNOSIS — Z20828 Contact with and (suspected) exposure to other viral communicable diseases: Secondary | ICD-10-CM | POA: Insufficient documentation

## 2019-01-11 DIAGNOSIS — F17210 Nicotine dependence, cigarettes, uncomplicated: Secondary | ICD-10-CM | POA: Insufficient documentation

## 2019-01-11 DIAGNOSIS — Z20822 Contact with and (suspected) exposure to covid-19: Secondary | ICD-10-CM

## 2019-01-11 DIAGNOSIS — R197 Diarrhea, unspecified: Secondary | ICD-10-CM | POA: Insufficient documentation

## 2019-01-11 DIAGNOSIS — R05 Cough: Secondary | ICD-10-CM | POA: Insufficient documentation

## 2019-01-11 DIAGNOSIS — R0981 Nasal congestion: Secondary | ICD-10-CM | POA: Insufficient documentation

## 2019-01-11 LAB — SARS CORONAVIRUS 2 (TAT 6-24 HRS): SARS Coronavirus 2: NEGATIVE

## 2019-01-11 MED ORDER — ACETAMINOPHEN 500 MG PO TABS
1000.0000 mg | ORAL_TABLET | Freq: Once | ORAL | Status: AC
  Administered 2019-01-11: 04:00:00 1000 mg via ORAL
  Filled 2019-01-11: qty 2

## 2019-01-11 NOTE — ED Provider Notes (Signed)
TIME SEEN: 4:02 AM  CHIEF COMPLAINT: Cough, nasal congestion, diarrhea, subjective fever  HPI: Patient is a 58 year old female with history of drug-seeking behavior, malingering, schizoaffective disorder, homelessness who presents to the emergency department with cough, nasal congestion, subjective fevers, diarrhea.  States she has had chills and body aches.  States initially symptoms started "several days ago" and then she tells me they started "2 weeks ago".  Patient is unable to give a clear history.  She states she is living in [facility] in a house.  She states she has not living in a homeless shelter.  She denies any sick contacts.  No nausea or vomiting.  No shortness of breath.  She does have an albuterol inhaler.  ROS: See HPI Constitutional: Subjective fever  Eyes: no drainage  ENT: no runny nose   Cardiovascular:  no chest pain  Resp: no SOB  GI: no vomiting; + diarrhea GU: no dysuria Integumentary: no rash  Allergy: no hives  Musculoskeletal: no leg swelling  Neurological: no slurred speech ROS otherwise negative  PAST MEDICAL HISTORY/PAST SURGICAL HISTORY:  Past Medical History:  Diagnosis Date  . Anemia   . Arthritis   . Chronic pain   . Drug-seeking behavior   . Malingering   . Osteopetrosis   . Psychosis (HCC)   . Schizoaffective disorder, bipolar type (HCC)     MEDICATIONS:  Prior to Admission medications   Medication Sig Start Date End Date Taking? Authorizing Provider  ibuprofen (ADVIL) 600 MG tablet Take 1 tablet (600 mg total) by mouth every 6 (six) hours as needed for up to 24 doses for mild pain or moderate pain. Do NOT take more than prescribed. 01/09/19   Terald Sleeper, MD  Throat Lozenges (LOZENGES MT) Use as directed 1 each in the mouth or throat daily as needed (sore throat).    [provider]  ranitidine (ZANTAC) 150 MG capsule Take 1 capsule (150 mg total) by mouth daily. Patient not taking: Reported on 03/20/2018 05/31/17 01/07/19   Lorre Nick, MD    ALLERGIES:  Allergies  Allergen Reactions  . Lemon Oil Rash  . Strawberry Extract Rash  . Other     PT reports having environmental allergies which she used to receive shots for, but cannot specify exact allergens    SOCIAL HISTORY:  Social History   Tobacco Use  . Smoking status: Current Some Day Smoker    Packs/day: 1.00    Types: Cigarettes  . Smokeless tobacco: Never Used  Substance Use Topics  . Alcohol use: No    FAMILY HISTORY: Family History  Family history unknown: Yes    EXAM: BP 123/76 (BP Location: Right Arm)   Pulse 76   Temp (!) 97.5 F (36.4 C) (Oral)   Resp 18   SpO2 96%  CONSTITUTIONAL: Alert and oriented and responds appropriately to questions.  Tonically ill-appearing, appears older than stated age, nontoxic HEAD: Normocephalic EYES: Conjunctivae clear, pupils appear equal, EOMI ENT: normal nose; moist mucous membranes NECK: Supple, no meningismus, no nuchal rigidity, no LAD  CARD: RRR; S1 and S2 appreciated; no murmurs, no clicks, no rubs, no gallops RESP: Normal chest excursion without splinting or tachypnea; breath sounds equal bilaterally, scattered expiratory wheezes that clear with coughing, no rhonchi, no rales, no hypoxia or respiratory distress, speaking full sentences, no increased work of breathing ABD/GI: Normal bowel sounds; non-distended; soft, non-tender, no rebound, no guarding, no peritoneal signs, no hepatosplenomegaly BACK:  The back appears normal and is non-tender  to palpation, there is no CVA tenderness EXT: Normal ROM in all joints; non-tender to palpation; no edema; normal capillary refill; no cyanosis, no calf tenderness or swelling    SKIN: Normal color for age and race; warm; no rash NEURO: Moves all extremities equally, normal gait PSYCH: Patient becomes agitated and begins yelling in the room but is easily redirected.    MEDICAL DECISION MAKING: Patient here with flulike symptoms.  She had a chest  x-ray several days ago that was unremarkable and recent labs that were normal.  Her abdominal exam is benign.  She does have some wheezing but this clears when she coughs and she has no hypoxia or increased work of breathing.  She has an albuterol inhaler with her.  Have offered Covid testing which she agrees to.  Will give Tylenol.  Discussed return precautions and supportive care instructions.  I do not feel she needs repeat work-up in the emergency department.  She does not appear toxic, septic or in respiratory distress.  Discussed return precautions.  Doubt ACS, PE, dissection, sepsis.  At this time, I do not feel there is any life-threatening condition present. I have reviewed and discussed all results (EKG, imaging, lab, urine as appropriate) and exam findings with patient/family. I have reviewed nursing notes and appropriate previous records.  I feel the patient is safe to be discharged home without further emergent workup and can continue workup as an outpatient as needed. Discussed usual and customary return precautions. Patient/family verbalize understanding and are comfortable with this plan.  Outpatient follow-up has been provided as needed. All questions have been answered.   Libni Fusaro was evaluated in Emergency Department on 01/11/2019 for the symptoms described in the history of present illness. She was evaluated in the context of the global COVID-19 pandemic, which necessitated consideration that the patient might be at risk for infection with the SARS-CoV-2 virus that causes COVID-19. Institutional protocols and algorithms that pertain to the evaluation of patients at risk for COVID-19 are in a state of rapid change based on information released by regulatory bodies including the CDC and federal and state organizations. These policies and algorithms were followed during the patient's care in the ED.    Ward, Delice Bison, DO 01/11/19 [phone]

## 2019-01-11 NOTE — ED Triage Notes (Signed)
Per pt she has been having generalized body aches, cough, congestion x 1 week. No fevers at this time. Pt said some diarrhea, but no abdominal pain. NO SOB.

## 2019-01-11 NOTE — Discharge Instructions (Addendum)
You may alternate Tylenol 1000 mg every 6 hours as needed for pain and Ibuprofen 800 mg every 8 hours as needed for pain.  Please take Ibuprofen with food. ° °

## 2019-01-18 ENCOUNTER — Emergency Department (HOSPITAL_COMMUNITY)
Admission: EM | Admit: 2019-01-18 | Discharge: 2019-01-19 | Disposition: A | Discharge status: Home / Self Care | Payer: Self-pay | Attending: Emergency Medicine | Admitting: Emergency Medicine

## 2019-01-18 ENCOUNTER — Other Ambulatory Visit: Payer: Self-pay

## 2019-01-18 ENCOUNTER — Encounter (HOSPITAL_COMMUNITY): Payer: Self-pay | Admitting: Emergency Medicine

## 2019-01-18 DIAGNOSIS — Z5321 Procedure and treatment not carried out due to patient leaving prior to being seen by health care provider: Secondary | ICD-10-CM | POA: Insufficient documentation

## 2019-01-18 DIAGNOSIS — M79605 Pain in left leg: Secondary | ICD-10-CM | POA: Insufficient documentation

## 2019-01-18 DIAGNOSIS — M79604 Pain in right leg: Secondary | ICD-10-CM | POA: Insufficient documentation

## 2019-01-18 NOTE — ED Triage Notes (Signed)
Patient ambulatory c/o bilateral leg pain and aching for past 3 years.

## 2019-01-19 ENCOUNTER — Other Ambulatory Visit: Payer: Self-pay

## 2019-01-19 ENCOUNTER — Emergency Department (HOSPITAL_COMMUNITY)
Admission: EM | Admit: 2019-01-19 | Discharge: 2019-01-20 | Disposition: A | Discharge status: Home / Self Care | Payer: Self-pay | Attending: Emergency Medicine | Admitting: Emergency Medicine

## 2019-01-19 ENCOUNTER — Encounter (HOSPITAL_COMMUNITY): Payer: Self-pay | Admitting: Emergency Medicine

## 2019-01-19 DIAGNOSIS — Z5321 Procedure and treatment not carried out due to patient leaving prior to being seen by health care provider: Secondary | ICD-10-CM | POA: Insufficient documentation

## 2019-01-19 DIAGNOSIS — M79652 Pain in left thigh: Secondary | ICD-10-CM | POA: Insufficient documentation

## 2019-01-19 LAB — CBC WITH DIFFERENTIAL/PLATELET
Abs Immature Granulocytes: 0.02 10*3/uL (ref 0.00–0.07)
Basophils Absolute: 0 10*3/uL (ref 0.0–0.1)
Basophils Relative: 0 %
Eosinophils Absolute: 0.3 10*3/uL (ref 0.0–0.5)
Eosinophils Relative: 3 %
HCT: 40.4 % (ref 36.0–46.0)
Hemoglobin: 13 g/dL (ref 12.0–15.0)
Immature Granulocytes: 0 %
Lymphocytes Relative: 34 %
Lymphs Abs: 2.8 10*3/uL (ref 0.7–4.0)
MCH: 30.2 pg (ref 26.0–34.0)
MCHC: 32.2 g/dL (ref 30.0–36.0)
MCV: 94 fL (ref 80.0–100.0)
Monocytes Absolute: 0.6 10*3/uL (ref 0.1–1.0)
Monocytes Relative: 8 %
Neutro Abs: 4.6 10*3/uL (ref 1.7–7.7)
Neutrophils Relative %: 55 %
Platelets: 283 10*3/uL (ref 150–400)
RBC: 4.3 MIL/uL (ref 3.87–5.11)
RDW: 13 % (ref 11.5–15.5)
WBC: 8.3 10*3/uL (ref 4.0–10.5)
nRBC: 0 % (ref 0.0–0.2)

## 2019-01-19 LAB — BASIC METABOLIC PANEL
Anion gap: 11 (ref 5–15)
BUN: 12 mg/dL (ref 6–20)
CO2: 24 mmol/L (ref 22–32)
Calcium: 9.3 mg/dL (ref 8.9–10.3)
Chloride: 106 mmol/L (ref 98–111)
Creatinine, Ser: 1.04 mg/dL — ABNORMAL HIGH (ref 0.44–1.00)
GFR calc Af Amer: >60 mL/min (ref >60)
GFR calc non Af Amer: 59 mL/min — ABNORMAL LOW (ref >60)
Glucose, Bld: 112 mg/dL — ABNORMAL HIGH (ref 70–99)
Potassium: 4.1 mmol/L (ref 3.5–5.1)
Sodium: 141 mmol/L (ref 135–145)

## 2019-01-19 NOTE — ED Triage Notes (Signed)
Patient reports chronic bilateral upper thigh pain for 5 years worse this week , ambulatory /denies injury . No fever or chills .

## 2019-01-19 NOTE — ED Notes (Signed)
Pt not in waiting room when called for room  

## 2019-01-20 NOTE — ED Notes (Signed)
Pt called with no response 

## 2019-01-20 NOTE — ED Notes (Signed)
Called pt.  Pt did not respond.

## 2019-01-21 ENCOUNTER — Emergency Department (HOSPITAL_COMMUNITY)
Admission: EM | Admit: 2019-01-21 | Discharge: 2019-01-21 | Disposition: A | Discharge status: Home / Self Care | Payer: Self-pay | Attending: Emergency Medicine | Admitting: Emergency Medicine

## 2019-01-21 ENCOUNTER — Encounter (HOSPITAL_COMMUNITY): Payer: Self-pay

## 2019-01-21 ENCOUNTER — Other Ambulatory Visit: Payer: Self-pay

## 2019-01-21 DIAGNOSIS — M545 Low back pain: Secondary | ICD-10-CM | POA: Insufficient documentation

## 2019-01-21 DIAGNOSIS — F17210 Nicotine dependence, cigarettes, uncomplicated: Secondary | ICD-10-CM | POA: Insufficient documentation

## 2019-01-21 DIAGNOSIS — G8929 Other chronic pain: Secondary | ICD-10-CM | POA: Insufficient documentation

## 2019-01-21 DIAGNOSIS — F250 Schizoaffective disorder, bipolar type: Secondary | ICD-10-CM | POA: Insufficient documentation

## 2019-01-21 NOTE — ED Notes (Signed)
Pt was found on the second floor walking around. Finally got pt to room 9.

## 2019-01-21 NOTE — ED Provider Notes (Signed)
[facility] EMERGENCY DEPARTMENT Provider Note   CSN: [id number] Arrival date & time: 01/21/19  1148     History   Chief Complaint Chief Complaint  Patient presents with  . Back Pain    HPI Betty Henderson is a 58 y.o. female with past medical history of malingering, drug-seeking behavior, chronic pain, schizoaffective disorder, presenting to the emergency department with multiple complaints.  Patient complains of chronic low back pain as well as bilateral leg pain.  She has been seen multiple times recently for the same.  No new falls or injuries.  She has been treating her symptoms with ibuprofen.  She does not currently have a PCP.  History somewhat limited as patient is a poor historian.     The history is provided by medical records.    Past Medical History:  Diagnosis Date  . Anemia   . Arthritis   . Chronic pain   . Drug-seeking behavior   . Malingering   . Osteopetrosis   . Psychosis ([place])   . Schizoaffective disorder, bipolar type ([place])     There are no active problems to display for this patient.   Past Surgical History:  Procedure Laterality Date  . MOUTH SURGERY    . TUBAL LIGATION       OB History   No obstetric history on file.      Home Medications    Prior to Admission medications   Medication Sig Start Date End Date Taking? Authorizing Provider  ibuprofen (ADVIL) 600 MG tablet Take 1 tablet (600 mg total) by mouth every 6 (six) hours as needed for up to 24 doses for mild pain or moderate pain. Do NOT take more than prescribed. 01/09/19   Wyvonnia Dusky, MD  Throat Lozenges (LOZENGES MT) Use as directed 1 each in the mouth or throat daily as needed (sore throat).    [provider]  ranitidine (ZANTAC) 150 MG capsule Take 1 capsule (150 mg total) by mouth daily. Patient not taking: Reported on 03/20/2018 05/31/17 01/07/19  Lacretia Leigh, MD    Family History Family History  Family history unknown: Yes     Social History Social History   Tobacco Use  . Smoking status: Current Some Day Smoker    Packs/day: 1.00    Types: Cigarettes  . Smokeless tobacco: Never Used  Substance Use Topics  . Alcohol use: No  . Drug use: Yes    Types: Cocaine    Comment: Pt denies      Allergies   Lemon oil, Strawberry extract, and Other   Review of Systems Review of Systems  All other systems reviewed and are negative.    Physical Exam Updated Vital Signs BP 116/69 (BP Location: Right Arm)   Pulse 70   Temp 97.6 F (36.4 C) (Oral)   Resp 20   SpO2 100%   Physical Exam Vitals signs and nursing note reviewed.  Constitutional:      General: She is not in acute distress.    Appearance: She is well-developed.  HENT:     Head: Normocephalic and atraumatic.  Eyes:     Conjunctiva/sclera: Conjunctivae normal.  Cardiovascular:     Rate and Rhythm: Normal rate and regular rhythm.  Pulmonary:     Effort: Pulmonary effort is normal. No respiratory distress.     Breath sounds: Normal breath sounds.  Musculoskeletal: Normal range of motion.     Comments: Spontaneously moving all extremities without difficulty.  No tenderness  to the back.  Neurological:     Mental Status: She is alert.     Comments: Normal tone.  5/5 strength in BUE and BLE including strong and equal grip strength and dorsiflexion/plantar flexion Sensory: Pinprick and light touch normal in BLE extremities.  Gait: normal gait and balance CV: distal pulses palpable throughout    Psychiatric:        Mood and Affect: Mood normal.        Behavior: Behavior normal.      ED Treatments / Results  Labs (all labs ordered are listed, but only abnormal results are displayed) Labs Reviewed - No data to display  EKG None  Radiology No results found.  Procedures Procedures (including critical care time)  Medications Ordered in ED Medications - No data to display   Initial Impression / Assessment and Plan / ED Course   I have reviewed the triage vital signs and the nursing notes.  Pertinent labs & imaging results that were available during my care of the patient were reviewed by me and considered in my medical decision making (see chart for details).        Patient presenting with chronic complaint of back and bilateral lower extremity pain.  She has been seen multiple times recently for the same.  No new injuries or red flags.  On exam she is in no distress.  No acute neuro findings.  Strongly encouraged patient follow-up with PCP regarding her ongoing symptoms.  She is agreeable to plan and safe for discharge.  Discussed results, findings, treatment and follow up. Patient advised of return precautions. Patient verbalized understanding and agreed with plan.   Final Clinical Impressions(s) / ED Diagnoses   Final diagnoses:  Other chronic pain    ED Discharge Orders    None       Deanza Upperman, [place] N, PA-C 01/21/19 1501    Gerhard Munch, MD 01/21/19 [phone]

## 2019-01-21 NOTE — ED Notes (Signed)
Unable to be found in waiting room.

## 2019-01-21 NOTE — ED Notes (Signed)
Pt given socks per request and happy meal and drink

## 2019-01-21 NOTE — ED Triage Notes (Signed)
Pt arrives POV for eval of back pain and generalized body aches. This is pt's 8th visit in 2 weeks for same. Pt reports she is "supposed to have surgery but we never get her back to see the doctor". Denies new complaints

## 2019-01-23 ENCOUNTER — Other Ambulatory Visit: Payer: Self-pay

## 2019-01-23 ENCOUNTER — Emergency Department (HOSPITAL_COMMUNITY)
Admission: EM | Admit: 2019-01-23 | Discharge: 2019-01-24 | Disposition: A | Discharge status: Home / Self Care | Payer: Self-pay | Attending: Emergency Medicine | Admitting: Emergency Medicine

## 2019-01-23 ENCOUNTER — Encounter (HOSPITAL_COMMUNITY): Payer: Self-pay | Admitting: Emergency Medicine

## 2019-01-23 DIAGNOSIS — G8929 Other chronic pain: Secondary | ICD-10-CM | POA: Insufficient documentation

## 2019-01-23 DIAGNOSIS — F17210 Nicotine dependence, cigarettes, uncomplicated: Secondary | ICD-10-CM | POA: Insufficient documentation

## 2019-01-23 LAB — URINALYSIS, ROUTINE W REFLEX MICROSCOPIC
Bacteria, UA: NONE SEEN
Bilirubin Urine: NEGATIVE
Glucose, UA: NEGATIVE mg/dL
Hgb urine dipstick: NEGATIVE
Ketones, ur: NEGATIVE mg/dL
Nitrite: NEGATIVE
Protein, ur: NEGATIVE mg/dL
Specific Gravity, Urine: 1.015 (ref 1.005–1.030)
pH: 6 (ref 5.0–8.0)

## 2019-01-23 NOTE — ED Triage Notes (Signed)
Patient reports chronic bilateral legs and low back pain for several years worse this week due to cold weather , denies injury/ambulatory . No fever or chills .

## 2019-01-24 ENCOUNTER — Encounter (HOSPITAL_COMMUNITY): Payer: Self-pay | Admitting: Emergency Medicine

## 2019-01-24 ENCOUNTER — Emergency Department (HOSPITAL_COMMUNITY)
Admission: EM | Admit: 2019-01-24 | Discharge: 2019-01-24 | Disposition: A | Discharge status: Home / Self Care | Payer: Self-pay | Attending: Emergency Medicine | Admitting: Emergency Medicine

## 2019-01-24 ENCOUNTER — Other Ambulatory Visit: Payer: Self-pay

## 2019-01-24 DIAGNOSIS — G894 Chronic pain syndrome: Secondary | ICD-10-CM | POA: Insufficient documentation

## 2019-01-24 DIAGNOSIS — F17210 Nicotine dependence, cigarettes, uncomplicated: Secondary | ICD-10-CM | POA: Insufficient documentation

## 2019-01-24 DIAGNOSIS — M79605 Pain in left leg: Secondary | ICD-10-CM | POA: Insufficient documentation

## 2019-01-24 MED ORDER — ACETAMINOPHEN 500 MG PO TABS
1000.0000 mg | ORAL_TABLET | Freq: Once | ORAL | Status: AC
  Administered 2019-01-24: 04:00:00 1000 mg via ORAL
  Filled 2019-01-24: qty 2

## 2019-01-24 MED ORDER — ACETAMINOPHEN 325 MG PO TABS
650.0000 mg | ORAL_TABLET | Freq: Once | ORAL | Status: AC
  Administered 2019-01-24: 650 mg via ORAL
  Filled 2019-01-24: qty 2

## 2019-01-24 NOTE — Discharge Instructions (Signed)
Recommend taking tylenol for motrin for pain.  Can try using heating pad or warm compress. Follow-up with your primary care doctor. Return here for new concerns.

## 2019-01-24 NOTE — ED Notes (Signed)
Patient verbalizes understanding of discharge instructions. Opportunity for questioning and answers were provided. Armband removed by staff, pt discharged from ED. Pt. ambulatory and discharged home.  

## 2019-01-24 NOTE — ED Provider Notes (Signed)
[facility] EMERGENCY DEPARTMENT Provider Note   CSN: [id number] Arrival date & time: 01/24/19  2030     History   Chief Complaint Chief Complaint  Patient presents with  . Foot Pain    HPI Betty Henderson is a 58 y.o. female with history of chronic pain, schizoaffective disorder, and multiple ED visits who presents with bilateral leg pain.  Patient states that she has arthritis and this is exacerbated her bilateral leg pain.  She denies any trauma.  States that she cannot afford any medicines for her pain.  Walking and exposure to the cold makes it worse.  OTC meds make it better.     HPI  Past Medical History:  Diagnosis Date  . Anemia   . Arthritis   . Chronic pain   . Drug-seeking behavior   . Malingering   . Osteopetrosis   . Psychosis ([place])   . Schizoaffective disorder, bipolar type ([place])     There are no active problems to display for this patient.   Past Surgical History:  Procedure Laterality Date  . MOUTH SURGERY    . TUBAL LIGATION       OB History   No obstetric history on file.      Home Medications    Prior to Admission medications   Medication Sig Start Date End Date Taking? Authorizing Provider  ibuprofen (ADVIL) 600 MG tablet Take 1 tablet (600 mg total) by mouth every 6 (six) hours as needed for up to 24 doses for mild pain or moderate pain. Do NOT take more than prescribed. 01/09/19   Wyvonnia Dusky, MD  Throat Lozenges (LOZENGES MT) Use as directed 1 each in the mouth or throat daily as needed (sore throat).    [provider]  ranitidine (ZANTAC) 150 MG capsule Take 1 capsule (150 mg total) by mouth daily. Patient not taking: Reported on 03/20/2018 05/31/17 01/07/19  Lacretia Leigh, MD    Family History Family History  Family history unknown: Yes    Social History Social History   Tobacco Use  . Smoking status: Current Some Day Smoker    Packs/day: 1.00    Types: Cigarettes  . Smokeless tobacco:  Never Used  Substance Use Topics  . Alcohol use: No  . Drug use: Yes    Types: Cocaine    Comment: Pt denies      Allergies   Lemon oil, Strawberry extract, and Other   Review of Systems Review of Systems  Constitutional: Negative for fever.  Respiratory: Negative for shortness of breath.   Musculoskeletal: Positive for arthralgias.  Skin: Negative for wound.     Physical Exam Updated Vital Signs BP 111/72 (BP Location: Left Arm)   Pulse 78   SpO2 98%   Physical Exam Vitals signs and nursing note reviewed.  Constitutional:      General: She is not in acute distress.    Appearance: Normal appearance. She is well-developed. She is not ill-appearing.     Comments: Chronically ill female in no acute distress.  HENT:     Head: Normocephalic and atraumatic.  Eyes:     General: No scleral icterus.       Right eye: No discharge.        Left eye: No discharge.     Conjunctiva/sclera: Conjunctivae normal.     Pupils: Pupils are equal, round, and reactive to light.  Neck:     Musculoskeletal: Normal range of motion.  Cardiovascular:  Rate and Rhythm: Normal rate and regular rhythm.  Pulmonary:     Effort: Pulmonary effort is normal. No respiratory distress.     Breath sounds: Wheezing present.  Abdominal:     General: There is no distension.  Musculoskeletal:     Comments: Left leg: FROM of hip, knee, ankle. No swelling, redness or tenderness. 2+ DP pulse.  Right leg: FROM of hip, knee, ankle. No swelling, redness, or tenderness. 2+ DP pulse.  Skin:    General: Skin is warm and dry.  Neurological:     Mental Status: She is alert and oriented to person, place, and time.  Psychiatric:        Behavior: Behavior normal.      ED Treatments / Results  Labs (all labs ordered are listed, but only abnormal results are displayed) Labs Reviewed - No data to display  EKG None  Radiology No results found.  Procedures Procedures (including critical care time)   Medications Ordered in ED Medications  acetaminophen (TYLENOL) tablet 650 mg (650 mg Oral Given 01/24/19 2328)     Initial Impression / Assessment and Plan / ED Course  I have reviewed the triage vital signs and the nursing notes.  Pertinent labs & imaging results that were available during my care of the patient were reviewed by me and considered in my medical decision making (see chart for details).  58 year old female with chronic pain and multiple ED visits presents with bilateral leg pain.  She has been seen multiple times for the same and malingering has been suspected.  Her vital signs are normal.  Her exam is unremarkable.  She denies any trauma for will defer any imaging today.  We will give her a dose of Tylenol and discharge  Final Clinical Impressions(s) / ED Diagnoses   Final diagnoses:  Chronic pain syndrome    ED Discharge Orders    None       Bethel Born, PA-C 01/25/19 0021    Terald Sleeper, MD 01/25/19 1149

## 2019-01-24 NOTE — ED Triage Notes (Signed)
Patient from streets, picked up from gas station with chronic bilateral foot pain.  VSS.  Afebrile.  Covid negative two weeks ago.

## 2019-01-24 NOTE — ED Notes (Signed)
Gave pt food and coffee. Pt ambulatory to lobby with steady gait

## 2019-01-24 NOTE — ED Provider Notes (Signed)
[facility] EMERGENCY DEPARTMENT Provider Note   CSN: [id number] Arrival date & time: 01/23/19  2212     History   Chief Complaint Chief Complaint  Patient presents with  . Legs Pain/Back Pain    Chronic    HPI Betty Henderson is a 58 y.o. female.     The history is provided by the patient and medical records.     58 y.o. F with hx of anemia, arthritis, chronic pain, drug seeking behavior, psychosis, schizoaffective disorder, presenting to the ED for chronic leg and low back pain.  States cold weather makes her pain worse.  Denies new falls or trauma.  No new numbness/weakness of the legs.  No bowel or bladder incontinence.  She has not had any money for OTC meds.  Past Medical History:  Diagnosis Date  . Anemia   . Arthritis   . Chronic pain   . Drug-seeking behavior   . Malingering   . Osteopetrosis   . Psychosis ([place])   . Schizoaffective disorder, bipolar type ([place])     There are no active problems to display for this patient.   Past Surgical History:  Procedure Laterality Date  . MOUTH SURGERY    . TUBAL LIGATION       OB History   No obstetric history on file.      Home Medications    Prior to Admission medications   Medication Sig Start Date End Date Taking? Authorizing Provider  ibuprofen (ADVIL) 600 MG tablet Take 1 tablet (600 mg total) by mouth every 6 (six) hours as needed for up to 24 doses for mild pain or moderate pain. Do NOT take more than prescribed. 01/09/19   Wyvonnia Dusky, MD  Throat Lozenges (LOZENGES MT) Use as directed 1 each in the mouth or throat daily as needed (sore throat).    [provider]  ranitidine (ZANTAC) 150 MG capsule Take 1 capsule (150 mg total) by mouth daily. Patient not taking: Reported on 03/20/2018 05/31/17 01/07/19  Lacretia Leigh, MD    Family History Family History  Family history unknown: Yes    Social History Social History   Tobacco Use  . Smoking status: Current  Some Day Smoker    Packs/day: 1.00    Types: Cigarettes  . Smokeless tobacco: Never Used  Substance Use Topics  . Alcohol use: No  . Drug use: Yes    Types: Cocaine    Comment: Pt denies      Allergies   Lemon oil, Strawberry extract, and Other   Review of Systems Review of Systems  Musculoskeletal: Positive for back pain.  All other systems reviewed and are negative.    Physical Exam Updated Vital Signs BP (!) 148/100 (BP Location: Right Arm)   Pulse 69   Temp 98.7 F (37.1 C) (Oral)   Resp 16   SpO2 95%   Physical Exam Vitals signs and nursing note reviewed.  Constitutional:      Appearance: She is well-developed.     Comments: disheveled  HENT:     Head: Normocephalic and atraumatic.  Eyes:     Conjunctiva/sclera: Conjunctivae normal.     Pupils: Pupils are equal, round, and reactive to light.  Neck:     Musculoskeletal: Normal range of motion.  Cardiovascular:     Rate and Rhythm: Normal rate and regular rhythm.     Heart sounds: Normal heart sounds.  Pulmonary:     Effort: Pulmonary effort is normal.  Breath sounds: Normal breath sounds.  Abdominal:     General: Bowel sounds are normal.     Palpations: Abdomen is soft.  Musculoskeletal: Normal range of motion.     Comments: Skin of legs and feet very dry but not appreciable swelling, bony deformities, or open wounds noted, normal strength/sensation of both legs, normal gait  Skin:    General: Skin is warm and dry.  Neurological:     Mental Status: She is alert and oriented to person, place, and time.      ED Treatments / Results  Labs (all labs ordered are listed, but only abnormal results are displayed) Labs Reviewed  URINALYSIS, ROUTINE W REFLEX MICROSCOPIC - Abnormal; Notable for the following components:      Result Value   Leukocytes,Ua SMALL (*)    All other components within normal limits    EKG None  Radiology No results found.  Procedures Procedures (including critical  care time)  Medications Ordered in ED Medications - No data to display   Initial Impression / Assessment and Plan / ED Course  I have reviewed the triage vital signs and the nursing notes.  Pertinent labs & imaging results that were available during my care of the patient were reviewed by me and considered in my medical decision making (see chart for details).   58 year old female here with chronic low back and bilateral leg pain.  No injury, trauma, or falls.  No focal neurologic deficits or other concerning symptoms on exam today.  Do not feel she needs any emergent work-up.  Given tylenol, recommended to continue at home +/- warm compresses.  Follow-up with primary care doctor.  Return here for any new/acute changes.  Final Clinical Impressions(s) / ED Diagnoses   Final diagnoses:  Other chronic pain    ED Discharge Orders    None       Garlon Hatchet, PA-C 01/24/19 0447    Dione Booze, MD 01/24/19 [phone]

## 2019-01-24 NOTE — ED Notes (Signed)
Patient states she is Covid positive, no record of testing positive.

## 2019-01-25 ENCOUNTER — Other Ambulatory Visit: Payer: Self-pay

## 2019-01-25 ENCOUNTER — Emergency Department (HOSPITAL_COMMUNITY)
Admission: EM | Admit: 2019-01-25 | Discharge: 2019-01-25 | Discharge status: Against Medical Advice | Payer: Self-pay | Attending: Emergency Medicine | Admitting: Emergency Medicine

## 2019-01-25 ENCOUNTER — Encounter (HOSPITAL_COMMUNITY): Payer: Self-pay | Admitting: Emergency Medicine

## 2019-01-25 DIAGNOSIS — Z5320 Procedure and treatment not carried out because of patient's decision for unspecified reasons: Secondary | ICD-10-CM | POA: Insufficient documentation

## 2019-01-25 DIAGNOSIS — M79672 Pain in left foot: Secondary | ICD-10-CM | POA: Insufficient documentation

## 2019-01-25 DIAGNOSIS — M79671 Pain in right foot: Secondary | ICD-10-CM | POA: Insufficient documentation

## 2019-01-25 DIAGNOSIS — F17210 Nicotine dependence, cigarettes, uncomplicated: Secondary | ICD-10-CM | POA: Insufficient documentation

## 2019-01-25 NOTE — ED Triage Notes (Signed)
Pt states she has wounds/scrapes on her bilateral lower feet from her shoes wearing into them.

## 2019-01-25 NOTE — ED Provider Notes (Addendum)
[place] EMERGENCY DEPARTMENT Provider Note   CSN: [id number] Arrival date & time: 01/25/19  1443     History   Chief Complaint Chief Complaint  Patient presents with  . Foot Pain    HPI Charlestine Rookstool is a 58 y.o. female.     HPI   58 year old female history of anemia, arthritis, drug-seeking behavior, malingering, psychosis, schizoaffective disorder, who presents the emergency department today for evaluation of bilateral foot pain.  Patient has been seen multiple times in the ED for same.  Most recently seen yesterday with reassuring exam.  States pain has been there for about a week.  It is constant.  Past Medical History:  Diagnosis Date  . Anemia   . Arthritis   . Chronic pain   . Drug-seeking behavior   . Malingering   . Osteopetrosis   . Psychosis ([place])   . Schizoaffective disorder, bipolar type ([place])     There are no active problems to display for this patient.   Past Surgical History:  Procedure Laterality Date  . MOUTH SURGERY    . TUBAL LIGATION       OB History   No obstetric history on file.      Home Medications    Prior to Admission medications   Medication Sig Start Date End Date Taking? Authorizing Provider  ibuprofen (ADVIL) 600 MG tablet Take 1 tablet (600 mg total) by mouth every 6 (six) hours as needed for up to 24 doses for mild pain or moderate pain. Do NOT take more than prescribed. 01/09/19   Wyvonnia Dusky, MD  Throat Lozenges (LOZENGES MT) Use as directed 1 each in the mouth or throat daily as needed (sore throat).    [provider]  ranitidine (ZANTAC) 150 MG capsule Take 1 capsule (150 mg total) by mouth daily. Patient not taking: Reported on 03/20/2018 05/31/17 01/07/19  Lacretia Leigh, MD    Family History Family History  Family history unknown: Yes    Social History Social History   Tobacco Use  . Smoking status: Current Some Day Smoker    Packs/day: 1.00    Types: Cigarettes   . Smokeless tobacco: Never Used  Substance Use Topics  . Alcohol use: No  . Drug use: Yes    Types: Cocaine    Comment: Pt denies      Allergies   Lemon oil, Strawberry extract, and Other   Review of Systems Review of Systems  Constitutional: Negative for fever.  Musculoskeletal:       Bilat foot pain     Physical Exam Updated Vital Signs BP 99/63 (BP Location: Right Arm)   Pulse 83   Temp 98 F (36.7 C) (Oral)   Resp 16   SpO2 95%   Physical Exam Vitals signs and nursing note reviewed.  Constitutional:      General: She is not in acute distress.    Appearance: She is well-developed.  HENT:     Head: Normocephalic and atraumatic.  Eyes:     Conjunctiva/sclera: Conjunctivae normal.  Neck:     Musculoskeletal: Neck supple.  Cardiovascular:     Rate and Rhythm: Normal rate.  Pulmonary:     Effort: Pulmonary effort is normal.  Musculoskeletal: Normal range of motion.     Comments: No significant tenderness to the bilateral feet.  No evidence for cellulitis or large ulceration.  Patient ambulatory without obvious discomfort. BP pulses 2+ bilaterally.   Skin:    General:  Skin is warm and dry.  Neurological:     Mental Status: She is alert.      ED Treatments / Results  Labs (all labs ordered are listed, but only abnormal results are displayed) Labs Reviewed - No data to display  EKG None  Radiology No results found.  Procedures Procedures (including critical care time)  Medications Ordered in ED Medications - No data to display   Initial Impression / Assessment and Plan / ED Course  I have reviewed the triage vital signs and the nursing notes.  Pertinent labs & imaging results that were available during my care of the patient were reviewed by me and considered in my medical decision making (see chart for details).   Final Clinical Impressions(s) / ED Diagnoses   Final diagnoses:  Bilateral foot pain   58 year old female presenting with  bilateral foot pain.  History of similar presentations to the ED for similar complaints.  Exam reassuring.  No evidence of infection.  Normal distal perfusion, range of motion.  Patient ambulatory without difficulty.  Will prescribe Tylenol and have her follow-up with PCP.  Advised on return precautions.  Pt left prior to discharge vitals being able to be obtained and she left before receiving her paperwork.   7:42 PM pt found in the waiting room. She states she wanted to have a cigarette so she walked outside. She will be given her discharge paperwork.   ED Discharge Orders    None       Karrie Meres, PA-C 01/25/19 1852    Karrie Meres, PA-C 01/25/19 1900    Karrie Meres, PA-C 01/25/19 1942    Charlynne Pander, MD 01/26/19 [phone]

## 2019-01-25 NOTE — ED Notes (Signed)
Pt found in waiting room sleeping.

## 2019-01-25 NOTE — ED Notes (Signed)
Pt became irritated and yelled obscenities at this RN when trying to go over discharge papers. Pt refused to take her discharge papers with her.

## 2019-01-25 NOTE — Discharge Instructions (Signed)

## 2019-01-26 ENCOUNTER — Other Ambulatory Visit: Payer: Self-pay

## 2019-01-26 ENCOUNTER — Emergency Department (HOSPITAL_COMMUNITY)
Admission: EM | Admit: 2019-01-26 | Discharge: 2019-01-26 | Disposition: A | Discharge status: Home / Self Care | Payer: Self-pay | Attending: Emergency Medicine | Admitting: Emergency Medicine

## 2019-01-26 ENCOUNTER — Encounter (HOSPITAL_COMMUNITY): Payer: Self-pay | Admitting: *Deleted

## 2019-01-26 DIAGNOSIS — Z5321 Procedure and treatment not carried out due to patient leaving prior to being seen by health care provider: Secondary | ICD-10-CM | POA: Insufficient documentation

## 2019-01-26 DIAGNOSIS — M25579 Pain in unspecified ankle and joints of unspecified foot: Secondary | ICD-10-CM | POA: Insufficient documentation

## 2019-01-26 NOTE — ED Notes (Signed)
Pt seen walking down the hallway. Pt was sitting at hallway bed cursing and talking to herself the entire time. RN asked pt if she was leaving and pt stated, "yeah fuck this." Rn asked pt if she wanted to stay to see doctor. Pt continued cursing and walking down hallway.

## 2019-01-26 NOTE — ED Triage Notes (Signed)
Pt c/o arthritis pain to bottom of her feet that gets worse with cold weather. Ambulatory with steady gait

## 2019-02-16 ENCOUNTER — Other Ambulatory Visit: Payer: Self-pay

## 2019-02-16 ENCOUNTER — Encounter (HOSPITAL_BASED_OUTPATIENT_CLINIC_OR_DEPARTMENT_OTHER): Payer: Self-pay | Admitting: Emergency Medicine

## 2019-02-16 ENCOUNTER — Emergency Department (HOSPITAL_BASED_OUTPATIENT_CLINIC_OR_DEPARTMENT_OTHER)
Admission: EM | Admit: 2019-02-16 | Discharge: 2019-02-17 | Disposition: A | Discharge status: Home / Self Care | Payer: Self-pay | Attending: Emergency Medicine | Admitting: Emergency Medicine

## 2019-02-16 DIAGNOSIS — F17210 Nicotine dependence, cigarettes, uncomplicated: Secondary | ICD-10-CM | POA: Insufficient documentation

## 2019-02-16 DIAGNOSIS — Z91018 Allergy to other foods: Secondary | ICD-10-CM | POA: Insufficient documentation

## 2019-02-16 DIAGNOSIS — Z765 Malingerer [conscious simulation]: Secondary | ICD-10-CM | POA: Insufficient documentation

## 2019-02-16 NOTE — ED Triage Notes (Signed)
Pt to ED via EMS with c/o bilateral leg pain and generalized body pain after walking extended time. Pt has hx of homelessness.

## 2019-02-16 NOTE — Discharge Instructions (Addendum)
At this time there does not appear to be the presence of an emergent medical condition, however there is always the potential for conditions to change. Please read and follow the below instructions.  Please return to the Emergency Department immediately for any new or worsening symptoms. Please be sure to follow up with your Primary Care Provider within one week regarding your visit today; please call their office to schedule an appointment even if you are feeling better for a follow-up visit.  Please read the additional information packets attached to your discharge summary.  Do not take your medicine if  develop an itchy rash, swelling in your mouth or lips, or difficulty breathing; call 911 and seek immediate emergency medical attention if this occurs.  Note: Portions of this text may have been transcribed using voice recognition software. Every effort was made to ensure accuracy; however, inadvertent computerized transcription errors may still be present. 

## 2019-02-16 NOTE — ED Provider Notes (Signed)
[facility] EMERGENCY DEPARTMENT Provider Note   CSN: [id number] Arrival date & time: 02/16/19  2038     History   Chief Complaint Chief Complaint  Patient presents with  . Leg Pain    HPI Betty Henderson is a 58 y.o. female history anemia, chronic pain, arthritis, drug-seeking behavior, malingering, osteopetrosis, psychosis, schizoaffective disorder.  Per triage note patient called EMS for bilateral leg pain and generalized body pain after walking for extended amount of time, note was made the patient is homeless.  Upon my initial evaluation patient is sleeping comfortably easily arousable to voice.  She reports that she is feeling well now that she has rested she has no complaints, I asked patient why she is here she reports that she was feeling tired earlier but is feeling much better now.  She is requesting a sandwich and some drink, she has no complaints.  Denies fever/chills, fall/injury, numbness/weakness, tingling, pain or any additional concerns.    HPI  Past Medical History:  Diagnosis Date  . Anemia   . Arthritis   . Chronic pain   . Drug-seeking behavior   . Malingering   . Osteopetrosis   . Psychosis ([place])   . Schizoaffective disorder, bipolar type ([place])     There are no active problems to display for this patient.   Past Surgical History:  Procedure Laterality Date  . MOUTH SURGERY    . TUBAL LIGATION       OB History   No obstetric history on file.      Home Medications    Prior to Admission medications   Medication Sig Start Date End Date Taking? Authorizing Provider  ibuprofen (ADVIL) 600 MG tablet Take 1 tablet (600 mg total) by mouth every 6 (six) hours as needed for up to 24 doses for mild pain or moderate pain. Do NOT take more than prescribed. 01/09/19   Wyvonnia Dusky, MD  Throat Lozenges (LOZENGES MT) Use as directed 1 each in the mouth or throat daily as needed (sore throat).    [provider]  ranitidine  (ZANTAC) 150 MG capsule Take 1 capsule (150 mg total) by mouth daily. Patient not taking: Reported on 03/20/2018 05/31/17 01/07/19  Lacretia Leigh, MD    Family History Family History  Family history unknown: Yes    Social History Social History   Tobacco Use  . Smoking status: Current Some Day Smoker    Packs/day: 1.00    Types: Cigarettes  . Smokeless tobacco: Never Used  Substance Use Topics  . Alcohol use: No  . Drug use: Yes    Types: Cocaine    Comment: Pt denies      Allergies   Lemon oil, Strawberry extract, and Other   Review of Systems Review of Systems Ten systems are reviewed and are negative for acute change except as noted in the HPI   Physical Exam Updated Vital Signs BP 118/61 (BP Location: Right Arm)   Pulse 66   Temp 98 F (36.7 C) (Oral)   Resp 16   Ht 5\' 6"  (1.676 m)   Wt 59 kg   SpO2 98%   BMI 20.98 kg/m   Physical Exam Constitutional:      General: She is not in acute distress.    Appearance: Normal appearance. She is well-developed. She is not ill-appearing or diaphoretic.  HENT:     Head: Normocephalic and atraumatic.     Right Ear: External ear normal.  Left Ear: External ear normal.     Nose: Nose normal.  Eyes:     General: Vision grossly intact. Gaze aligned appropriately.     Pupils: Pupils are equal, round, and reactive to light.  Neck:     Musculoskeletal: Normal range of motion.     Trachea: Trachea and phonation normal. No tracheal deviation.  Cardiovascular:     Pulses:          Dorsalis pedis pulses are 2+ on the right side and 2+ on the left side.  Pulmonary:     Effort: Pulmonary effort is normal. No respiratory distress.  Abdominal:     General: There is no distension.     Palpations: Abdomen is soft.     Tenderness: There is no abdominal tenderness. There is no guarding or rebound.  Musculoskeletal: Normal range of motion.  Feet:     Right foot:     Protective Sensation: 3 sites tested. 3 sites sensed.      Left foot:     Protective Sensation: 3 sites tested. 3 sites sensed.  Skin:    General: Skin is warm and dry.  Neurological:     Mental Status: She is alert.     GCS: GCS eye subscore is 4. GCS verbal subscore is 5. GCS motor subscore is 6.     Comments: Speech is clear and goal oriented, follows commands Major Cranial nerves without deficit, no facial droop Moves extremities without ataxia, coordination intact  Psychiatric:        Behavior: Behavior normal.      ED Treatments / Results  Labs (all labs ordered are listed, but only abnormal results are displayed) Labs Reviewed - No data to display  EKG None  Radiology No results found.  Procedures Procedures (including critical care time)  Medications Ordered in ED Medications - No data to display   Initial Impression / Assessment and Plan / ED Course  I have reviewed the triage vital signs and the nursing notes.  Pertinent labs & imaging results that were available during my care of the patient were reviewed by me and considered in my medical decision making (see chart for details).    Patient well-appearing no acute distress sleeping comfortably easily arousable to voice.  She denies any concerns or complaints reports that she is feeling well.  She reports she called EMS because she was feeling tired from walking she has rested here for a few hours and reports she is feeling much better.  She has been given food and snacks by nursing staff which she is eating.  As she has no complaints or concerns there is no indication for any work-up or imaging in this emergency department.  She is neurovascularly intact and without sign of significant injury.  She is agreeable to discharge.  At this time there does not appear to be any evidence of an acute emergency medical condition and the patient appears stable for discharge with appropriate outpatient follow up. Diagnosis was discussed with patient who verbalizes understanding of  care plan and is agreeable to discharge. I have discussed return precautions with patient who verbalizes understanding of return precautions. Patient encouraged to follow-up with their PCP. All questions answered.   Note: Portions of this report may have been transcribed using voice recognition software. Every effort was made to ensure accuracy; however, inadvertent computerized transcription errors may still be present. Final Clinical Impressions(s) / ED Diagnoses   Final diagnoses:  Malingering    ED  Discharge Orders    None       Elizabeth Palau 02/16/19 2330    Tegeler, Canary Brim, MD 02/16/19 [phone]

## 2019-02-17 ENCOUNTER — Emergency Department (HOSPITAL_BASED_OUTPATIENT_CLINIC_OR_DEPARTMENT_OTHER)
Admission: EM | Admit: 2019-02-17 | Discharge: 2019-02-17 | Disposition: A | Discharge status: Home / Self Care | Payer: Self-pay | Attending: Emergency Medicine | Admitting: Emergency Medicine

## 2019-02-17 ENCOUNTER — Emergency Department (HOSPITAL_COMMUNITY)
Admission: EM | Admit: 2019-02-17 | Discharge: 2019-02-17 | Disposition: A | Discharge status: Home / Self Care | Payer: Self-pay | Attending: Emergency Medicine | Admitting: Emergency Medicine

## 2019-02-17 ENCOUNTER — Encounter (HOSPITAL_BASED_OUTPATIENT_CLINIC_OR_DEPARTMENT_OTHER): Payer: Self-pay

## 2019-02-17 ENCOUNTER — Encounter (HOSPITAL_COMMUNITY): Payer: Self-pay | Admitting: Emergency Medicine

## 2019-02-17 ENCOUNTER — Emergency Department (HOSPITAL_COMMUNITY)
Admission: EM | Admit: 2019-02-17 | Discharge: 2019-02-18 | Disposition: A | Discharge status: Home / Self Care | Payer: Self-pay | Attending: Emergency Medicine | Admitting: Emergency Medicine

## 2019-02-17 ENCOUNTER — Other Ambulatory Visit: Payer: Self-pay

## 2019-02-17 DIAGNOSIS — R21 Rash and other nonspecific skin eruption: Secondary | ICD-10-CM | POA: Insufficient documentation

## 2019-02-17 DIAGNOSIS — F1410 Cocaine abuse, uncomplicated: Secondary | ICD-10-CM | POA: Insufficient documentation

## 2019-02-17 DIAGNOSIS — R0602 Shortness of breath: Secondary | ICD-10-CM | POA: Insufficient documentation

## 2019-02-17 DIAGNOSIS — F17210 Nicotine dependence, cigarettes, uncomplicated: Secondary | ICD-10-CM | POA: Insufficient documentation

## 2019-02-17 DIAGNOSIS — R109 Unspecified abdominal pain: Secondary | ICD-10-CM | POA: Insufficient documentation

## 2019-02-17 DIAGNOSIS — Z79899 Other long term (current) drug therapy: Secondary | ICD-10-CM | POA: Insufficient documentation

## 2019-02-17 DIAGNOSIS — R197 Diarrhea, unspecified: Secondary | ICD-10-CM | POA: Insufficient documentation

## 2019-02-17 DIAGNOSIS — G894 Chronic pain syndrome: Secondary | ICD-10-CM | POA: Insufficient documentation

## 2019-02-17 DIAGNOSIS — Z5321 Procedure and treatment not carried out due to patient leaving prior to being seen by health care provider: Secondary | ICD-10-CM | POA: Insufficient documentation

## 2019-02-17 DIAGNOSIS — R1110 Vomiting, unspecified: Secondary | ICD-10-CM | POA: Insufficient documentation

## 2019-02-17 DIAGNOSIS — M2550 Pain in unspecified joint: Secondary | ICD-10-CM | POA: Insufficient documentation

## 2019-02-17 NOTE — ED Triage Notes (Signed)
Patient reports chronic arthritic pain at both knees radiating to both thighs for several years , ambulatory ( homeless) , denies fever or chills .

## 2019-02-17 NOTE — ED Triage Notes (Signed)
Pt. In triage cursing towards the corner of the room. When asked why she is here today pt states "the doctor can tell me shit".

## 2019-02-17 NOTE — ED Triage Notes (Signed)
Patient walked to UC and refused to be seen. Insisted call for EMS, c/o pain in all joints from arthritis. Patient is homeless. Ambulatory.

## 2019-02-17 NOTE — ED Notes (Signed)
Pt states she is ready to go. Pt gathers belongings and leaves department ambulatory. Pt declines having VS checked prior to leaving.

## 2019-02-17 NOTE — Discharge Instructions (Addendum)
Follow up with your doctor

## 2019-02-17 NOTE — ED Notes (Signed)
Patient ambulatory without difficulty out of department. Cussing at staff at the time of discharge.

## 2019-02-17 NOTE — ED Triage Notes (Signed)
Upon asking patient standard screening questions pt reports no thoughts of hurting herself or others. Then when asked if she has any weapons on her today pt states yelling at this RN stating "I aint doing this shit! I'm leaving." Pt ambulated out of room with steady gait, security followed pt to lobby.

## 2019-02-17 NOTE — ED Provider Notes (Signed)
[facility]-EMERGENCY DEPT Provider Note   CSN: [id number] Arrival date & time: 02/17/19  1606     History   Chief Complaint Chief Complaint  Patient presents with  . Joint Pain    HPI Betty Henderson is a 58 y.o. female.     HPI Patient came in for multiple complaints.  Reportedly pain everywhere.  Has been seen in the ER for the same.  Multiple visits for same.  History of psychosis and malingering.  She answered yes to every review of system question that I had. Past Medical History:  Diagnosis Date  . Anemia   . Arthritis   . Chronic pain   . Drug-seeking behavior   . Malingering   . Osteopetrosis   . Psychosis ([facility])   . Schizoaffective disorder, bipolar type ([facility])     There are no active problems to display for this patient.   Past Surgical History:  Procedure Laterality Date  . MOUTH SURGERY    . TUBAL LIGATION       OB History   No obstetric history on file.      Home Medications    Prior to Admission medications   Medication Sig Start Date End Date Taking? Authorizing Provider  ibuprofen (ADVIL) 600 MG tablet Take 1 tablet (600 mg total) by mouth every 6 (six) hours as needed for up to 24 doses for mild pain or moderate pain. Do NOT take more than prescribed. 01/09/19   Terald Sleeper, MD  Throat Lozenges (LOZENGES MT) Use as directed 1 each in the mouth or throat daily as needed (sore throat).    [provider]  ranitidine (ZANTAC) 150 MG capsule Take 1 capsule (150 mg total) by mouth daily. Patient not taking: Reported on 03/20/2018 05/31/17 01/07/19  Lorre Nick, MD    Family History Family History  Family history unknown: Yes    Social History Social History   Tobacco Use  . Smoking status: Current Some Day Smoker    Packs/day: 1.00    Types: Cigarettes  . Smokeless tobacco: Never Used  Substance Use Topics  . Alcohol use: No  . Drug use: Yes    Types: Cocaine    Comment: Pt denies       Allergies   Lemon oil, Strawberry extract, and Other   Review of Systems Review of Systems  Constitutional: Positive for appetite change, fatigue and fever.  HENT: Positive for congestion.   Eyes: Positive for pain.  Respiratory: Positive for shortness of breath.   Gastrointestinal: Positive for abdominal pain, diarrhea, nausea and vomiting.  Genitourinary: Positive for flank pain.  Musculoskeletal: Positive for back pain.  Skin: Positive for rash.  Neurological: Positive for weakness.  Psychiatric/Behavioral: Negative for suicidal ideas.     Physical Exam Updated Vital Signs BP 117/79   Pulse 76   Temp 98.5 F (36.9 C) (Oral)   Resp 18   SpO2 100%   Physical Exam Vitals signs and nursing note reviewed.  HENT:     Head: Normocephalic.  Cardiovascular:     Rate and Rhythm: Normal rate and regular rhythm.  Pulmonary:     Breath sounds: No wheezing, rhonchi or rales.  Abdominal:     Tenderness: There is no abdominal tenderness.  Musculoskeletal:     Right lower leg: No edema.     Left lower leg: No edema.  Skin:    General: Skin is warm.  Neurological:     Mental Status: She is  alert. Mental status is at baseline.      ED Treatments / Results  Labs (all labs ordered are listed, but only abnormal results are displayed) Labs Reviewed - No data to display  EKG None  Radiology No results found.  Procedures Procedures (including critical care time)  Medications Ordered in ED Medications - No data to display   Initial Impression / Assessment and Plan / ED Course  I have reviewed the triage vital signs and the nursing notes.  Pertinent labs & imaging results that were available during my care of the patient were reviewed by me and considered in my medical decision making (see chart for details).        Patient came in for pain everywhere and multiple other complaints.  Final Clinical Impressions(s) / ED Diagnoses   Final diagnoses:  Chronic pain  syndrome    ED Discharge Orders    None       Davonna Belling, MD 02/17/19 1706

## 2019-02-17 NOTE — ED Triage Notes (Signed)
During triage pt states she is leaving, advised to stay to see the doctor at least. Pt aggress.

## 2019-02-18 ENCOUNTER — Emergency Department (HOSPITAL_COMMUNITY)
Admission: EM | Admit: 2019-02-18 | Discharge: 2019-02-18 | Disposition: A | Discharge status: Home / Self Care | Payer: Self-pay | Attending: Emergency Medicine | Admitting: Emergency Medicine

## 2019-02-18 ENCOUNTER — Encounter (HOSPITAL_COMMUNITY): Payer: Self-pay | Admitting: Emergency Medicine

## 2019-02-18 ENCOUNTER — Other Ambulatory Visit: Payer: Self-pay

## 2019-02-18 DIAGNOSIS — Z79899 Other long term (current) drug therapy: Secondary | ICD-10-CM | POA: Insufficient documentation

## 2019-02-18 DIAGNOSIS — F17210 Nicotine dependence, cigarettes, uncomplicated: Secondary | ICD-10-CM | POA: Insufficient documentation

## 2019-02-18 DIAGNOSIS — G8929 Other chronic pain: Secondary | ICD-10-CM | POA: Insufficient documentation

## 2019-02-18 DIAGNOSIS — M25562 Pain in left knee: Secondary | ICD-10-CM | POA: Insufficient documentation

## 2019-02-18 DIAGNOSIS — M25561 Pain in right knee: Secondary | ICD-10-CM | POA: Insufficient documentation

## 2019-02-18 DIAGNOSIS — F1410 Cocaine abuse, uncomplicated: Secondary | ICD-10-CM | POA: Insufficient documentation

## 2019-02-18 MED ORDER — KETOROLAC TROMETHAMINE 60 MG/2ML IM SOLN
60.0000 mg | Freq: Once | INTRAMUSCULAR | Status: AC
  Administered 2019-02-18: 12:00:00 60 mg via INTRAMUSCULAR
  Filled 2019-02-18: qty 2

## 2019-02-18 MED ORDER — IBUPROFEN 400 MG PO TABS
600.0000 mg | ORAL_TABLET | Freq: Once | ORAL | Status: AC
  Administered 2019-02-18: 22:00:00 600 mg via ORAL
  Filled 2019-02-18: qty 1

## 2019-02-18 MED ORDER — MELOXICAM 15 MG PO TABS: 15.0000 mg | ORAL_TABLET | Freq: Every day | ORAL | 2 refills | Status: DC

## 2019-02-18 NOTE — ED Triage Notes (Signed)
Pt reports chronic arthritis and pain mostly "all over" but worse to knees. Pt was here last night but due to wait was unable to wait.

## 2019-02-18 NOTE — Discharge Instructions (Addendum)
Please follow up with a primary medical provider for treatment of chronic pain

## 2019-02-18 NOTE — ED Triage Notes (Signed)
Patient reports chronic bilateral toes pain from walking , ambulatory /respirations unlabored .

## 2019-02-18 NOTE — ED Notes (Signed)
Patient verbalizes understanding of discharge instructions. Opportunity for questioning and answers were provided. Armband removed by staff, pt discharged from ED ambulatory.   

## 2019-02-18 NOTE — ED Provider Notes (Signed)
[facility] EMERGENCY DEPARTMENT Provider Note   CSN: [id number] Arrival date & time: 02/18/19  2132     History   Chief Complaint Chief Complaint  Patient presents with  . Toes Pain    HPI Betty Henderson is a 58 y.o. female.     Patient to ED with bilateral toe pain, unchanged from chronic complaint of toe pain from walking. No fever, swelling, leg pain.  The history is provided by the patient. No language interpreter was used.    Past Medical History:  Diagnosis Date  . Anemia   . Arthritis   . Chronic pain   . Drug-seeking behavior   . Malingering   . Osteopetrosis   . Psychosis ([place])   . Schizoaffective disorder, bipolar type ([place])     There are no active problems to display for this patient.   Past Surgical History:  Procedure Laterality Date  . MOUTH SURGERY    . TUBAL LIGATION       OB History   No obstetric history on file.      Home Medications    Prior to Admission medications   Medication Sig Start Date End Date Taking? Authorizing Provider  ibuprofen (ADVIL) 600 MG tablet Take 1 tablet (600 mg total) by mouth every 6 (six) hours as needed for up to 24 doses for mild pain or moderate pain. Do NOT take more than prescribed. 01/09/19   Wyvonnia Dusky, MD  meloxicam (MOBIC) 15 MG tablet Take 1 tablet (15 mg total) by mouth daily. 02/18/19 02/18/20  Fransico Meadow, PA-C  Throat Lozenges (LOZENGES MT) Use as directed 1 each in the mouth or throat daily as needed (sore throat).    [provider]  ranitidine (ZANTAC) 150 MG capsule Take 1 capsule (150 mg total) by mouth daily. Patient not taking: Reported on 03/20/2018 05/31/17 01/07/19  Lacretia Leigh, MD    Family History Family History  Family history unknown: Yes    Social History Social History   Tobacco Use  . Smoking status: Current Some Day Smoker    Packs/day: 1.00    Types: Cigarettes  . Smokeless tobacco: Never Used  Substance Use Topics  .  Alcohol use: No  . Drug use: Yes    Types: Cocaine    Comment: Pt denies      Allergies   Lemon oil, Strawberry extract, and Other   Review of Systems Review of Systems  Constitutional: Negative for fever.  Musculoskeletal:       See HPI.  Skin: Negative for color change and wound.  Neurological: Negative for numbness.     Physical Exam Updated Vital Signs BP 116/77 (BP Location: Left Arm)   Pulse 77   Temp 97.9 F (36.6 C) (Oral)   Resp 16   SpO2 100%   Physical Exam Constitutional:      Appearance: She is well-developed.  Neck:     Musculoskeletal: Normal range of motion.  Pulmonary:     Effort: Pulmonary effort is normal.  Musculoskeletal:     Comments: Feet: Bilateral hallux valgus deformities. No swelling of toes or either foot. No discoloration, skin breakdown, blistering or visualized callus.   Skin:    General: Skin is warm and dry.  Neurological:     Mental Status: She is alert and oriented to person, place, and time.      ED Treatments / Results  Labs (all labs ordered are listed, but only abnormal results are displayed)  Labs Reviewed - No data to display  EKG None  Radiology No results found.  Procedures Procedures (including critical care time)  Medications Ordered in ED Medications  ibuprofen (ADVIL) tablet 600 mg (has no administration in time range)     Initial Impression / Assessment and Plan / ED Course  I have reviewed the triage vital signs and the nursing notes.  Pertinent labs & imaging results that were available during my care of the patient were reviewed by me and considered in my medical decision making (see chart for details).        Patient well known to ED with 18 visits in 6 months, often for foot/toe pain from walking, including visit earlier today.   No evidence skin breakdown or infection. Exam c/w chronic pain.  Final Clinical Impressions(s) / ED Diagnoses   Final diagnoses:  Other chronic pain     ED Discharge Orders    None       Danne Harbor 02/18/19 2227    Arby Barrette, MD 02/19/19 2348

## 2019-02-18 NOTE — ED Provider Notes (Signed)
[facility] EMERGENCY DEPARTMENT Provider Note   CSN: [id number] Arrival date & time: 02/18/19  1027     History   Chief Complaint Chief Complaint  Patient presents with  . Knee Pain    HPI Betty Henderson is a 58 y.o. female.     Pt has chronic pain.  Pt reports she wants that shot we give her that helps.  Pt has arthritis.  She states she does not have any medication currently   The history is provided by the patient. No language interpreter was used.  Knee Pain Location:  Knee Knee location:  L knee and R knee Pain details:    Timing:  Constant Foreign body present:  No foreign bodies Tetanus status:  Up to date Relieved by:  Nothing Worsened by:  Nothing Ineffective treatments:  None tried   Past Medical History:  Diagnosis Date  . Anemia   . Arthritis   . Chronic pain   . Drug-seeking behavior   . Malingering   . Osteopetrosis   . Psychosis ([place])   . Schizoaffective disorder, bipolar type ([place])     There are no active problems to display for this patient.   Past Surgical History:  Procedure Laterality Date  . MOUTH SURGERY    . TUBAL LIGATION       OB History   No obstetric history on file.      Home Medications    Prior to Admission medications   Medication Sig Start Date End Date Taking? Authorizing Provider  ibuprofen (ADVIL) 600 MG tablet Take 1 tablet (600 mg total) by mouth every 6 (six) hours as needed for up to 24 doses for mild pain or moderate pain. Do NOT take more than prescribed. 01/09/19   Wyvonnia Dusky, MD  meloxicam (MOBIC) 15 MG tablet Take 1 tablet (15 mg total) by mouth daily. 02/18/19 02/18/20  Fransico Meadow, PA-C  Throat Lozenges (LOZENGES MT) Use as directed 1 each in the mouth or throat daily as needed (sore throat).    [provider]  ranitidine (ZANTAC) 150 MG capsule Take 1 capsule (150 mg total) by mouth daily. Patient not taking: Reported on 03/20/2018 05/31/17 01/07/19  Lacretia Leigh, MD    Family History Family History  Family history unknown: Yes    Social History Social History   Tobacco Use  . Smoking status: Current Some Day Smoker    Packs/day: 1.00    Types: Cigarettes  . Smokeless tobacco: Never Used  Substance Use Topics  . Alcohol use: No  . Drug use: Yes    Types: Cocaine    Comment: Pt denies      Allergies   Lemon oil, Strawberry extract, and Other   Review of Systems Review of Systems  All other systems reviewed and are negative.    Physical Exam Updated Vital Signs BP 113/71 (BP Location: Right Arm)   Pulse 66   Temp 98 F (36.7 C) (Oral)   Resp 16   SpO2 100%   Physical Exam Vitals signs and nursing note reviewed.  Constitutional:      Appearance: She is well-developed.  HENT:     Head: Normocephalic.  Neck:     Musculoskeletal: Normal range of motion.  Cardiovascular:     Rate and Rhythm: Normal rate.  Pulmonary:     Effort: Pulmonary effort is normal.  Abdominal:     General: There is no distension.  Musculoskeletal: Normal range of motion.  Neurological:     Mental Status: She is alert and oriented to person, place, and time.  Psychiatric:        Mood and Affect: Mood normal.      ED Treatments / Results  Labs (all labs ordered are listed, but only abnormal results are displayed) Labs Reviewed - No data to display  EKG None  Radiology No results found.  Procedures Procedures (including critical care time)  Medications Ordered in ED Medications  ketorolac (TORADOL) injection 60 mg (60 mg Intramuscular Given 02/18/19 1135)     Initial Impression / Assessment and Plan / ED Course  I have reviewed the triage vital signs and the nursing notes.  Pertinent labs & imaging results that were available during my care of the patient were reviewed by me and considered in my medical decision making (see chart for details).        MDM  Pt given injection or toradol and rx for meloxicam    Final Clinical Impressions(s) / ED Diagnoses   Final diagnoses:  Other chronic pain    ED Discharge Orders         Ordered    meloxicam (MOBIC) 15 MG tablet  Daily     02/18/19 1136        An After Visit Summary was printed and given to the patient.    Elson Areas, [place] 02/18/19 1401    Gerhard Munch, MD 02/19/19 [phone]

## 2019-02-18 NOTE — ED Notes (Signed)
Pt called x3 for vitals, no reply.

## 2019-02-18 NOTE — ED Notes (Signed)
Pt seen leaving parking lot. Pt has left items in the waiting room.

## 2019-03-03 ENCOUNTER — Emergency Department (HOSPITAL_COMMUNITY): Payer: Self-pay

## 2019-03-03 ENCOUNTER — Encounter (HOSPITAL_COMMUNITY): Payer: Self-pay | Admitting: *Deleted

## 2019-03-03 ENCOUNTER — Other Ambulatory Visit: Payer: Self-pay

## 2019-03-03 ENCOUNTER — Emergency Department (HOSPITAL_COMMUNITY)
Admission: EM | Admit: 2019-03-03 | Discharge: 2019-03-03 | Disposition: A | Discharge status: Home / Self Care | Payer: Self-pay | Attending: Emergency Medicine | Admitting: Emergency Medicine

## 2019-03-03 DIAGNOSIS — F17210 Nicotine dependence, cigarettes, uncomplicated: Secondary | ICD-10-CM | POA: Insufficient documentation

## 2019-03-03 DIAGNOSIS — M79673 Pain in unspecified foot: Secondary | ICD-10-CM

## 2019-03-03 DIAGNOSIS — G8929 Other chronic pain: Secondary | ICD-10-CM | POA: Insufficient documentation

## 2019-03-03 DIAGNOSIS — Z20828 Contact with and (suspected) exposure to other viral communicable diseases: Secondary | ICD-10-CM | POA: Insufficient documentation

## 2019-03-03 DIAGNOSIS — M79672 Pain in left foot: Secondary | ICD-10-CM | POA: Insufficient documentation

## 2019-03-03 DIAGNOSIS — N898 Other specified noninflammatory disorders of vagina: Secondary | ICD-10-CM | POA: Insufficient documentation

## 2019-03-03 DIAGNOSIS — M79671 Pain in right foot: Secondary | ICD-10-CM | POA: Insufficient documentation

## 2019-03-03 DIAGNOSIS — R062 Wheezing: Secondary | ICD-10-CM | POA: Insufficient documentation

## 2019-03-03 LAB — URINALYSIS, ROUTINE W REFLEX MICROSCOPIC
Bilirubin Urine: NEGATIVE
Glucose, UA: NEGATIVE mg/dL
Hgb urine dipstick: NEGATIVE
Ketones, ur: NEGATIVE mg/dL
Nitrite: NEGATIVE
Protein, ur: NEGATIVE mg/dL
Specific Gravity, Urine: 1.013 (ref 1.005–1.030)
pH: 6 (ref 5.0–8.0)

## 2019-03-03 LAB — POC SARS CORONAVIRUS 2 AG -  ED: SARS Coronavirus 2 Ag: NEGATIVE

## 2019-03-03 LAB — HIV ANTIBODY (ROUTINE TESTING W REFLEX): HIV Screen 4th Generation wRfx: NONREACTIVE

## 2019-03-03 MED ORDER — CEFTRIAXONE SODIUM 250 MG IJ SOLR
250.0000 mg | Freq: Once | INTRAMUSCULAR | Status: AC
  Administered 2019-03-03: 250 mg via INTRAMUSCULAR
  Filled 2019-03-03: qty 250

## 2019-03-03 MED ORDER — AZITHROMYCIN 250 MG PO TABS
1000.0000 mg | ORAL_TABLET | Freq: Once | ORAL | Status: AC
  Administered 2019-03-03: 1000 mg via ORAL
  Filled 2019-03-03: qty 4

## 2019-03-03 MED ORDER — ALBUTEROL SULFATE HFA 108 (90 BASE) MCG/ACT IN AERS
6.0000 | INHALATION_SPRAY | Freq: Once | RESPIRATORY_TRACT | Status: AC
  Administered 2019-03-03: 6 via RESPIRATORY_TRACT
  Filled 2019-03-03: qty 6.7

## 2019-03-03 MED ORDER — LIDOCAINE HCL (PF) 1 % IJ SOLN
2.0000 mL | Freq: Once | INTRAMUSCULAR | Status: AC
  Administered 2019-03-03: 2 mL via INTRADERMAL

## 2019-03-03 MED ORDER — METRONIDAZOLE 500 MG PO TABS
2000.0000 mg | ORAL_TABLET | Freq: Once | ORAL | Status: AC
  Administered 2019-03-03: 2000 mg via ORAL
  Filled 2019-03-03: qty 4

## 2019-03-03 MED ORDER — LIDOCAINE HCL (PF) 1 % IJ SOLN
INTRAMUSCULAR | Status: AC
  Filled 2019-03-03: qty 5

## 2019-03-03 MED ORDER — BENZONATATE 100 MG PO CAPS: 100.0000 mg | ORAL_CAPSULE | Freq: Three times a day (TID) | ORAL | 0 refills | Status: DC

## 2019-03-03 NOTE — ED Provider Notes (Signed)
[facility] EMERGENCY DEPARTMENT Provider Note   CSN: [id number] Arrival date & time: 03/03/19  1352     History   Chief Complaint Chief Complaint  Patient presents with  . Foot Pain  . Vaginal Discharge    HPI Betty Henderson is a 58 y.o. female presenting for evaluation of foot pain, vaginal discharge, and flulike symptoms.  Patient states she thinks she has the flu.  Patient states she has been feeling poorly, had a sore throat, cough, nausea, vomiting, diarrhea.  She is unable to specify when her symptoms first began, but eventually states she thinks it has been going on for about a week.  Patient told the triage nurse that she think she had Covid in November, unable to state when.  Patient is unsure if she was tested for Covid.  Distally, patient presenting for evaluation of toe pain.  Patient states her pain is all the time, but she feels like her toenails are falling off.  She has not taken anything for this. Additionally, patient states she has been having vaginal discharge for the past week.  She describes it as a white discharge.  She denies abdominal pain, dysuria, hematuria, urinary frequency.  She states she is not sexually active.  Additional history obtained from chart review.  Patient with a history of schizoaffective disorder, psychosis, malingering, drug-seeking behavior, chronic pain, anemia.  Patient was seen 7 days ago for viral URI like symptoms, no Covid test was performed at that time.     HPI  Past Medical History:  Diagnosis Date  . Anemia   . Arthritis   . Chronic pain   . Drug-seeking behavior   . Malingering   . Osteopetrosis   . Psychosis ([facility])   . Schizoaffective disorder, bipolar type ([facility])     There are no active problems to display for this patient.   Past Surgical History:  Procedure Laterality Date  . MOUTH SURGERY    . TUBAL LIGATION       OB History   No obstetric history on file.      Home Medications     Prior to Admission medications   Medication Sig Start Date End Date Taking? Authorizing Provider  benzonatate (TESSALON) 100 MG capsule Take 1 capsule (100 mg total) by mouth every 8 (eight) hours. 03/03/19   Maddilynn Esperanza, PA-C  ibuprofen (ADVIL) 600 MG tablet Take 1 tablet (600 mg total) by mouth every 6 (six) hours as needed for up to 24 doses for mild pain or moderate pain. Do NOT take more than prescribed. 01/09/19   Terald Sleeper, MD  meloxicam (MOBIC) 15 MG tablet Take 1 tablet (15 mg total) by mouth daily. 02/18/19 02/18/20  Elson Areas, PA-C  Throat Lozenges (LOZENGES MT) Use as directed 1 each in the mouth or throat daily as needed (sore throat).    [provider]  ranitidine (ZANTAC) 150 MG capsule Take 1 capsule (150 mg total) by mouth daily. Patient not taking: Reported on 03/20/2018 05/31/17 01/07/19  Lorre Nick, MD    Family History Family History  Family history unknown: Yes    Social History Social History   Tobacco Use  . Smoking status: Current Some Day Smoker    Packs/day: 1.00    Types: Cigarettes  . Smokeless tobacco: Never Used  Substance Use Topics  . Alcohol use: No  . Drug use: Yes    Types: Cocaine    Comment: Pt denies  Allergies   Lemon oil, Strawberry extract, and Other   Review of Systems Review of Systems  Respiratory: Positive for cough, shortness of breath and wheezing.   Gastrointestinal: Positive for nausea and vomiting.  Genitourinary: Positive for vaginal discharge.  Musculoskeletal: Positive for arthralgias (Toe pain, chronic).  All other systems reviewed and are negative.    Physical Exam Updated Vital Signs BP 107/61   Pulse 84   Temp 98 F (36.7 C) (Oral)   Resp 16   Ht 5\' 6"  (1.676 m)   Wt 57 kg   SpO2 98%   BMI 20.27 kg/m   Physical Exam Vitals signs and nursing note reviewed. Exam conducted with a chaperone present.  Constitutional:      General: She is not in acute distress.     Appearance: She is well-developed.     Comments: Patient comfortably in the bed in no acute distress  HENT:     Head: Normocephalic and atraumatic.  Eyes:     Extraocular Movements: Extraocular movements intact.     Conjunctiva/sclera: Conjunctivae normal.     Pupils: Pupils are equal, round, and reactive to light.  Neck:     Musculoskeletal: Normal range of motion and neck supple.  Cardiovascular:     Rate and Rhythm: Normal rate and regular rhythm.     Pulses: Normal pulses.  Pulmonary:     Effort: Pulmonary effort is normal. No respiratory distress.     Breath sounds: Wheezing present.     Comments: Wheezing in all fields.  Wet cough noted on exam.  Speaking in full sentences.  Signs of respiratory distress.  Sats stable on room air. Abdominal:     General: There is no distension.     Palpations: Abdomen is soft. There is no mass.     Tenderness: There is no abdominal tenderness. There is no guarding or rebound.     Comments: No tenderness palpation the abdomen.  Soft without rigidity, guarding, distention.  Negative rebound  Genitourinary:    Cervix: Discharge and friability present. No cervical motion tenderness.     Comments: Copious yellow discharge noted on exam.  Strawberry cervix noted. Cervix is friable.  No CMT or adnexal tenderness. Musculoskeletal: Normal range of motion.  Skin:    General: Skin is warm and dry.     Capillary Refill: Capillary refill takes less than 2 seconds.     Comments: Toenails are long, but not loose.  No erythema, warmth, or signs of infection.  Patient able to move all toes without difficulty.  Pedal pulses intact.  Good distal cap refill.  No signs of ischemia  Neurological:     Mental Status: She is alert and oriented to person, place, and time.      ED Treatments / Results  Labs (all labs ordered are listed, but only abnormal results are displayed) Labs Reviewed  URINALYSIS, ROUTINE W REFLEX MICROSCOPIC - Abnormal; Notable for the  following components:      Result Value   Leukocytes,Ua LARGE (*)    Bacteria, UA RARE (*)    All other components within normal limits  HIV ANTIBODY (ROUTINE TESTING W REFLEX)  RPR  POC SARS CORONAVIRUS 2 AG -  ED  GC/CHLAMYDIA PROBE AMP ([place]) NOT AT [facility]    EKG None  Radiology Dg Chest Portable 1 View  Result Date: 03/03/2019 CLINICAL DATA:  Cough. EXAM: PORTABLE CHEST 1 VIEW COMPARISON:  Chest radiograph 01/07/2019 FINDINGS: Heart size within normal limits. There is  no airspace consolidation within the lungs. No evidence of pleural effusion or pneumothorax. Elevation of the right hemidiaphragm is more pronounced as compared to prior examination. No acute bony abnormality. IMPRESSION: No airspace consolidation within the lungs. Elevation of the right hemidiaphragm is more pronounced than on prior examinations, nonspecific. Electronically Signed   By: Kellie Simmering DO   On: 03/03/2019 17:45    Procedures Procedures (including critical care time)  Medications Ordered in ED Medications  albuterol (VENTOLIN HFA) 108 (90 Base) MCG/ACT inhaler 6 puff (6 puffs Inhalation Given 03/03/19 1942)  cefTRIAXone (ROCEPHIN) injection 250 mg (250 mg Intramuscular Given 03/03/19 1943)  azithromycin (ZITHROMAX) tablet 1,000 mg (1,000 mg Oral Given 03/03/19 1942)  metroNIDAZOLE (FLAGYL) tablet 2,000 mg (2,000 mg Oral Given 03/03/19 1944)  lidocaine (PF) (XYLOCAINE) 1 % injection 2 mL (2 mLs Intradermal Given 03/03/19 1943)     Initial Impression / Assessment and Plan / ED Course  I have reviewed the triage vital signs and the nursing notes.  Pertinent labs & imaging results that were available during my care of the patient were reviewed by me and considered in my medical decision making (see chart for details).        Patient presenting for multiple complaints.  Physical examination, she appears nontoxic.  She is complaining of bilateral foot pain, this is chronic.  Patient states there is  nothing new today. Additionally, patient complaining of vaginal discharge.  On exam, patient has copious yellow discharge with a friable cervix.  No CMT or adnexal tenderness however, doubt PID or TOA.  Will send wet prep, gonorrhea, chlamydia. Additionally, patient reporting flulike symptoms for the past week.  She is wheezing on exam and a wet sounding cough.  Will obtain x-ray and rapid Covid test.  Will order albuterol and reassess.  Unfortunately patient's wet prep was untestable.  Will treat for trichomoniasis, as I have a high suspicion for this.  Well she may also have BV, this is not life-threatening and can be followed up outpatient as needed if symptoms are not improving.  You are also covered for gonorrhea and chlamydia.  X-ray viewed interpreted by me, no pneumonia pneumothorax and effusion.  On reassessment, patient's respiratory status is improved. covid is negative.   Discussed findings with patient.  Discussed that she was treated for her vaginal discharge today.  Discussed continued supportive treatment of her URI symptoms at home.  Discussed follow-up with health department as needed.  At this time, patient appears safe for discharge.  Return precautions given.  Patient states she understands and agrees to plan.   Final Clinical Impressions(s) / ED Diagnoses   Final diagnoses:  Wheezing  Chronic foot pain, unspecified laterality  Vaginal discharge    ED Discharge Orders         Ordered    benzonatate (TESSALON) 100 MG capsule  Every 8 hours     03/03/19 2056           Franchot Heidelberg, PA-C 03/03/19 2155    Wyvonnia Dusky, MD 03/04/19 1346

## 2019-03-03 NOTE — Discharge Instructions (Signed)
You were treated today for your vaginal discharge.  Follow-up with the health department if it is not improving. Use the inhaler as needed for wheezing or shortness of breath.  Use cough drops as needed for your cough. Follow-up with your primary care doctor for further management of your foot pain. Return to the emergency room with any new, worsening, concerning symptoms.

## 2019-03-03 NOTE — ED Triage Notes (Signed)
Pt states bil foot pain from her toenails falling off.  Also c/o increased vaginal discharge.

## 2019-03-04 LAB — RPR: RPR Ser Ql: NONREACTIVE

## 2019-03-05 LAB — GC/CHLAMYDIA PROBE AMP (~~LOC~~) NOT AT ARMC
Chlamydia: NEGATIVE
Neisseria Gonorrhea: NEGATIVE

## 2019-03-17 ENCOUNTER — Encounter (HOSPITAL_COMMUNITY): Payer: Self-pay

## 2019-03-17 ENCOUNTER — Emergency Department (HOSPITAL_COMMUNITY)
Admission: EM | Admit: 2019-03-17 | Discharge: 2019-03-17 | Discharge status: Against Medical Advice | Payer: Self-pay | Attending: Emergency Medicine | Admitting: Emergency Medicine

## 2019-03-17 ENCOUNTER — Other Ambulatory Visit: Payer: Self-pay

## 2019-03-17 DIAGNOSIS — Z5321 Procedure and treatment not carried out due to patient leaving prior to being seen by health care provider: Secondary | ICD-10-CM | POA: Insufficient documentation

## 2019-03-17 LAB — CBC
HCT: 38.1 % (ref 36.0–46.0)
Hemoglobin: 12.2 g/dL (ref 12.0–15.0)
MCH: 29.8 pg (ref 26.0–34.0)
MCHC: 32 g/dL (ref 30.0–36.0)
MCV: 93.2 fL (ref 80.0–100.0)
Platelets: 286 10*3/uL (ref 150–400)
RBC: 4.09 MIL/uL (ref 3.87–5.11)
RDW: 14 % (ref 11.5–15.5)
WBC: 5.2 10*3/uL (ref 4.0–10.5)
nRBC: 0 % (ref 0.0–0.2)

## 2019-03-17 LAB — COMPREHENSIVE METABOLIC PANEL
ALT: 14 U/L (ref 0–44)
AST: 16 U/L (ref 15–41)
Albumin: 3.9 g/dL (ref 3.5–5.0)
Alkaline Phosphatase: 67 U/L (ref 38–126)
Anion gap: 8 (ref 5–15)
BUN: 12 mg/dL (ref 6–20)
CO2: 26 mmol/L (ref 22–32)
Calcium: 9.4 mg/dL (ref 8.9–10.3)
Chloride: 107 mmol/L (ref 98–111)
Creatinine, Ser: 0.76 mg/dL (ref 0.44–1.00)
GFR calc Af Amer: >60 mL/min (ref >60)
GFR calc non Af Amer: >60 mL/min (ref >60)
Glucose, Bld: 160 mg/dL — ABNORMAL HIGH (ref 70–99)
Potassium: 3.9 mmol/L (ref 3.5–5.1)
Sodium: 141 mmol/L (ref 135–145)
Total Bilirubin: 0.5 mg/dL (ref 0.3–1.2)
Total Protein: 7 g/dL (ref 6.5–8.1)

## 2019-03-17 LAB — URINALYSIS, ROUTINE W REFLEX MICROSCOPIC
Bilirubin Urine: NEGATIVE
Glucose, UA: NEGATIVE mg/dL
Hgb urine dipstick: NEGATIVE
Ketones, ur: NEGATIVE mg/dL
Leukocytes,Ua: NEGATIVE
Nitrite: NEGATIVE
Protein, ur: NEGATIVE mg/dL
Specific Gravity, Urine: 1.019 (ref 1.005–1.030)
pH: 6 (ref 5.0–8.0)

## 2019-03-17 LAB — LIPASE, BLOOD: Lipase: 42 U/L (ref 11–51)

## 2019-03-17 NOTE — ED Triage Notes (Signed)
Pt reports abd pain and "problems with my aneurysm". Pt a.o, nad noted.

## 2019-03-17 NOTE — ED Notes (Signed)
No answer for vitals  

## 2019-03-22 ENCOUNTER — Encounter: Payer: Self-pay | Admitting: *Deleted

## 2019-03-22 NOTE — [facility] Nurse Program (Addendum)
  Dept: [facility] Nurse Program Note  Date of Encounter: 03/22/2019  Past Medical History: Past Medical History:  Diagnosis Date  . Anemia   . Arthritis   . Chronic pain   . Drug-seeking behavior   . Malingering   . Osteopetrosis   . Psychosis ([place])   . Schizoaffective disorder, bipolar type [facility])     Encounter Details:  Pt came to [facility] and is requesting counseling assistance. Pt is not currently taking psychiatric medications. She does not verbalize si or hi. Pt says that she has family to stay with in [place] and stayed in an abandoned building last night near [facility]. Pt was given information about [facility] due to counseling services. Also discussed pt going to [facility] due to medication management resources. Pt left with both resource information and said she would consider them.

## 2019-04-28 ENCOUNTER — Other Ambulatory Visit: Payer: Self-pay

## 2019-04-28 ENCOUNTER — Encounter (HOSPITAL_COMMUNITY): Payer: Self-pay | Admitting: Emergency Medicine

## 2019-04-28 ENCOUNTER — Emergency Department (HOSPITAL_COMMUNITY): Payer: HRSA Program

## 2019-04-28 ENCOUNTER — Emergency Department (HOSPITAL_COMMUNITY)
Admission: EM | Admit: 2019-04-28 | Discharge: 2019-04-28 | Disposition: A | Discharge status: Home / Self Care | Payer: HRSA Program | Attending: Emergency Medicine | Admitting: Emergency Medicine

## 2019-04-28 DIAGNOSIS — Z20822 Contact with and (suspected) exposure to covid-19: Secondary | ICD-10-CM | POA: Insufficient documentation

## 2019-04-28 DIAGNOSIS — F17210 Nicotine dependence, cigarettes, uncomplicated: Secondary | ICD-10-CM | POA: Diagnosis not present

## 2019-04-28 DIAGNOSIS — Z765 Malingerer [conscious simulation]: Secondary | ICD-10-CM | POA: Insufficient documentation

## 2019-04-28 DIAGNOSIS — G894 Chronic pain syndrome: Secondary | ICD-10-CM | POA: Diagnosis not present

## 2019-04-28 NOTE — ED Notes (Signed)
Attempted to wake pt up to have her wait in lobby for her son. Pt refused to get out of bed. Pt escorted out by security.

## 2019-04-28 NOTE — ED Notes (Signed)
Attempted to call sons again to pick up patient but no response.

## 2019-04-28 NOTE — ED Notes (Addendum)
Attempted to call both sons to pick up patient but no answer. Will try again.

## 2019-04-28 NOTE — ED Triage Notes (Signed)
Pt arriving via [facility] with concern for possible Covid symptoms. Pt reports she was released from jail yesterday where she states she tested positive for Covid but is unable to provide a date. Pt reports cough, chill, and body aches. Afebrile.

## 2019-04-28 NOTE — ED Provider Notes (Signed)
[facility]-EMERGENCY DEPT Provider Note   CSN: [id number] Arrival date & time: 04/28/19  1943     History Chief Complaint  Patient presents with  . Possible Covid    Betty Henderson is a 59 y.o. female with history of schizoaffective d/o, frequent ED visits, hx of malingering who presents with feeling cold. Pt is a poor historian. She states that she is cold and it makes her legs hurt. She has frequent ED visits for her chronic leg pain. Apparently she was in jail recently and reportedly tested positive for COVID but cannot give me any details regarding this. On review of EMR she was at [facility] yesterday after being discharged from jail. Had a negative COVID antigen test yesterday.  LEVEL 5 caveat due to patient being minimally cooperative  HPI     Past Medical History:  Diagnosis Date  . Anemia   . Arthritis   . Chronic pain   . Drug-seeking behavior   . Malingering   . Osteopetrosis   . Psychosis ([facility])   . Schizoaffective disorder, bipolar type ([facility])     There are no problems to display for this patient.   Past Surgical History:  Procedure Laterality Date  . MOUTH SURGERY    . TUBAL LIGATION       OB History   No obstetric history on file.     Family History  Family history unknown: Yes    Social History   Tobacco Use  . Smoking status: Current Some Day Smoker    Packs/day: 1.00    Types: Cigarettes  . Smokeless tobacco: Never Used  Substance Use Topics  . Alcohol use: No  . Drug use: Yes    Types: Cocaine    Comment: Pt denies     Home Medications Prior to Admission medications   Medication Sig Start Date End Date Taking? Authorizing Provider  benzonatate (TESSALON) 100 MG capsule Take 1 capsule (100 mg total) by mouth every 8 (eight) hours. Patient not taking: Reported on 04/28/2019 03/03/19   Caccavale, Sophia, PA-C  ibuprofen (ADVIL) 600 MG tablet Take 1 tablet (600 mg total) by mouth every 6 (six) hours as  needed for up to 24 doses for mild pain or moderate pain. Do NOT take more than prescribed. Patient not taking: Reported on 04/28/2019 01/09/19   Terald Sleeper, MD  meloxicam (MOBIC) 15 MG tablet Take 1 tablet (15 mg total) by mouth daily. Patient not taking: Reported on 04/28/2019 02/18/19 02/18/20  Elson Areas, PA-C  ranitidine (ZANTAC) 150 MG capsule Take 1 capsule (150 mg total) by mouth daily. Patient not taking: Reported on 03/20/2018 05/31/17 01/07/19  Lorre Nick, MD    Allergies    Lemon oil, Strawberry extract, Other, Adhesive  [tape], Cherry, and Proanthocyanidin  Review of Systems   Review of Systems  Unable to perform ROS: Psychiatric disorder    Physical Exam Updated Vital Signs BP 140/88 (BP Location: Left Arm)   Pulse 87   Temp 98.1 F (36.7 C) (Oral)   Resp 19   SpO2 97%   Physical Exam Vitals and nursing note reviewed.  Constitutional:      General: She is not in acute distress.    Appearance: Normal appearance. She is well-developed. She is not ill-appearing.     Comments: Bundled up under her coat and I have to repeatedly call her name to get her to talk to me  HENT:     Head: Normocephalic  and atraumatic.  Eyes:     General: No scleral icterus.       Right eye: No discharge.        Left eye: No discharge.     Conjunctiva/sclera: Conjunctivae normal.     Pupils: Pupils are equal, round, and reactive to light.  Cardiovascular:     Rate and Rhythm: Normal rate and regular rhythm.  Pulmonary:     Effort: Pulmonary effort is normal. No respiratory distress.     Breath sounds: Normal breath sounds.  Abdominal:     General: There is no distension.  Musculoskeletal:     Cervical back: Normal range of motion.  Skin:    General: Skin is warm and dry.  Neurological:     Mental Status: She is alert and oriented to person, place, and time.  Psychiatric:        Attention and Perception: She is inattentive.        Behavior: Behavior is agitated.      ED Results / Procedures / Treatments   Labs (all labs ordered are listed, but only abnormal results are displayed) Labs Reviewed  NOVEL CORONAVIRUS, NAA (HOSP ORDER, SEND-OUT TO REF LAB; TAT 18-24 HRS)    EKG None  Radiology DG Chest Port 1 View  Result Date: 04/28/2019 CLINICAL DATA:  Chills and cough for 24 hours, COVID-19 positivity EXAM: PORTABLE CHEST 1 VIEW COMPARISON:  03/03/2019 FINDINGS: Cardiac shadows within normal limits. The lungs are well aerated bilaterally. No focal infiltrate or sizable effusion is seen. No bony abnormality is noted. IMPRESSION: No acute abnormality noted. Electronically Signed   By: Inez Catalina M.D.   On: 04/28/2019 20:56    Procedures Procedures (including critical care time)  Medications Ordered in ED Medications - No data to display  ED Course  I have reviewed the triage vital signs and the nursing notes.  Pertinent labs & imaging results that were available during my care of the patient were reviewed by me and considered in my medical decision making (see chart for details).  59 year old female presents with feeling cold which is exacerbating her chronic leg pain issues. Triage nurse also mentions some concern for COVID. Difficult to tell with patient being such a poor historian and her psychiatric history. Vitals are reassuring. CXR is negative. COVID test obtained. Will d/c.  Betty Henderson was evaluated in Emergency Department on 04/28/2019 for the symptoms described in the history of present illness. She was evaluated in the context of the global COVID-19 pandemic, which necessitated consideration that the patient might be at risk for infection with the SARS-CoV-2 virus that causes COVID-19. Institutional protocols and algorithms that pertain to the evaluation of patients at risk for COVID-19 are in a state of rapid change based on information released by regulatory bodies including the CDC and federal and state organizations. These policies  and algorithms were followed during the patient's care in the ED.  MDM Rules/Calculators/A&P                       Final Clinical Impression(s) / ED Diagnoses Final diagnoses:  Malingering  Chronic pain syndrome    Rx / DC Orders ED Discharge Orders    None       Recardo Evangelist, PA-C 04/28/19 2253    Veryl Speak, MD 04/28/19 2321

## 2019-04-30 LAB — NOVEL CORONAVIRUS, NAA (HOSP ORDER, SEND-OUT TO REF LAB; TAT 18-24 HRS): SARS-CoV-2, NAA: NOT DETECTED

## 2019-05-01 ENCOUNTER — Other Ambulatory Visit: Payer: Self-pay

## 2019-05-01 ENCOUNTER — Emergency Department (HOSPITAL_BASED_OUTPATIENT_CLINIC_OR_DEPARTMENT_OTHER)
Admission: EM | Admit: 2019-05-01 | Discharge: 2019-05-02 | Disposition: A | Discharge status: Home / Self Care | Payer: Self-pay | Attending: Emergency Medicine | Admitting: Emergency Medicine

## 2019-05-01 ENCOUNTER — Encounter (HOSPITAL_BASED_OUTPATIENT_CLINIC_OR_DEPARTMENT_OTHER): Payer: Self-pay | Admitting: Emergency Medicine

## 2019-05-01 DIAGNOSIS — R197 Diarrhea, unspecified: Secondary | ICD-10-CM | POA: Insufficient documentation

## 2019-05-01 DIAGNOSIS — F17210 Nicotine dependence, cigarettes, uncomplicated: Secondary | ICD-10-CM | POA: Insufficient documentation

## 2019-05-01 DIAGNOSIS — Z91018 Allergy to other foods: Secondary | ICD-10-CM | POA: Insufficient documentation

## 2019-05-01 DIAGNOSIS — Z91048 Other nonmedicinal substance allergy status: Secondary | ICD-10-CM | POA: Insufficient documentation

## 2019-05-01 DIAGNOSIS — R1030 Lower abdominal pain, unspecified: Secondary | ICD-10-CM

## 2019-05-01 DIAGNOSIS — L299 Pruritus, unspecified: Secondary | ICD-10-CM | POA: Insufficient documentation

## 2019-05-01 DIAGNOSIS — R102 Pelvic and perineal pain: Secondary | ICD-10-CM | POA: Insufficient documentation

## 2019-05-01 DIAGNOSIS — Z888 Allergy status to other drugs, medicaments and biological substances status: Secondary | ICD-10-CM | POA: Insufficient documentation

## 2019-05-01 LAB — CBC WITH DIFFERENTIAL/PLATELET
Abs Immature Granulocytes: 0.01 10*3/uL (ref 0.00–0.07)
Basophils Absolute: 0 10*3/uL (ref 0.0–0.1)
Basophils Relative: 0 %
Eosinophils Absolute: 0.3 10*3/uL (ref 0.0–0.5)
Eosinophils Relative: 4 %
HCT: 34.3 % — ABNORMAL LOW (ref 36.0–46.0)
Hemoglobin: 11 g/dL — ABNORMAL LOW (ref 12.0–15.0)
Immature Granulocytes: 0 %
Lymphocytes Relative: 32 %
Lymphs Abs: 2 10*3/uL (ref 0.7–4.0)
MCH: 29.8 pg (ref 26.0–34.0)
MCHC: 32.1 g/dL (ref 30.0–36.0)
MCV: 93 fL (ref 80.0–100.0)
Monocytes Absolute: 0.5 10*3/uL (ref 0.1–1.0)
Monocytes Relative: 8 %
Neutro Abs: 3.5 10*3/uL (ref 1.7–7.7)
Neutrophils Relative %: 56 %
Platelets: 268 10*3/uL (ref 150–400)
RBC: 3.69 MIL/uL — ABNORMAL LOW (ref 3.87–5.11)
RDW: 13.5 % (ref 11.5–15.5)
WBC: 6.3 10*3/uL (ref 4.0–10.5)
nRBC: 0 % (ref 0.0–0.2)

## 2019-05-01 LAB — COMPREHENSIVE METABOLIC PANEL
ALT: 21 U/L (ref 0–44)
AST: 23 U/L (ref 15–41)
Albumin: 3.9 g/dL (ref 3.5–5.0)
Alkaline Phosphatase: 59 U/L (ref 38–126)
Anion gap: 8 (ref 5–15)
BUN: 16 mg/dL (ref 6–20)
CO2: 24 mmol/L (ref 22–32)
Calcium: 8.9 mg/dL (ref 8.9–10.3)
Chloride: 105 mmol/L (ref 98–111)
Creatinine, Ser: 0.74 mg/dL (ref 0.44–1.00)
GFR calc Af Amer: >60 mL/min (ref >60)
GFR calc non Af Amer: >60 mL/min (ref >60)
Glucose, Bld: 158 mg/dL — ABNORMAL HIGH (ref 70–99)
Potassium: 3.4 mmol/L — ABNORMAL LOW (ref 3.5–5.1)
Sodium: 137 mmol/L (ref 135–145)
Total Bilirubin: 0.5 mg/dL (ref 0.3–1.2)
Total Protein: 7.2 g/dL (ref 6.5–8.1)

## 2019-05-01 LAB — LIPASE, BLOOD: Lipase: 44 U/L (ref 11–51)

## 2019-05-01 MED ORDER — ONDANSETRON 4 MG PO TBDP
4.0000 mg | ORAL_TABLET | Freq: Once | ORAL | Status: AC
  Administered 2019-05-01: 23:00:00 4 mg via ORAL
  Filled 2019-05-01: qty 1

## 2019-05-01 NOTE — ED Triage Notes (Addendum)
Pt arrives via EMS with ongoing frostbite pain for 1 month. States she's been going to multiple EDs for treatment. No evidence of treatment for frostbite in chart review, during triage interview patient states that she is not having foot pain. States abd pain and diarrhea for a couple days. States she's aching and itching everywhere which prompted her emergency dept visit. Hx cocaine use. States she was recently treated for COVID in jail, however does not recall being tested and instead describes Mantoux test for TB.

## 2019-05-02 ENCOUNTER — Encounter (HOSPITAL_BASED_OUTPATIENT_CLINIC_OR_DEPARTMENT_OTHER): Payer: Self-pay | Admitting: *Deleted

## 2019-05-02 ENCOUNTER — Emergency Department (HOSPITAL_BASED_OUTPATIENT_CLINIC_OR_DEPARTMENT_OTHER)
Admission: EM | Admit: 2019-05-02 | Discharge: 2019-05-03 | Disposition: A | Discharge status: Home / Self Care | Payer: Self-pay | Attending: Emergency Medicine | Admitting: Emergency Medicine

## 2019-05-02 DIAGNOSIS — Z79899 Other long term (current) drug therapy: Secondary | ICD-10-CM | POA: Insufficient documentation

## 2019-05-02 DIAGNOSIS — F17210 Nicotine dependence, cigarettes, uncomplicated: Secondary | ICD-10-CM | POA: Insufficient documentation

## 2019-05-02 DIAGNOSIS — M2550 Pain in unspecified joint: Secondary | ICD-10-CM

## 2019-05-02 DIAGNOSIS — M1990 Unspecified osteoarthritis, unspecified site: Secondary | ICD-10-CM | POA: Insufficient documentation

## 2019-05-02 DIAGNOSIS — F250 Schizoaffective disorder, bipolar type: Secondary | ICD-10-CM | POA: Insufficient documentation

## 2019-05-02 LAB — URINALYSIS, ROUTINE W REFLEX MICROSCOPIC
Bilirubin Urine: NEGATIVE
Glucose, UA: NEGATIVE mg/dL
Hgb urine dipstick: NEGATIVE
Ketones, ur: NEGATIVE mg/dL
Leukocytes,Ua: NEGATIVE
Nitrite: NEGATIVE
Protein, ur: NEGATIVE mg/dL
Specific Gravity, Urine: <1.005 — ABNORMAL LOW (ref 1.005–1.030)
pH: 6 (ref 5.0–8.0)

## 2019-05-02 MED ORDER — ACETAMINOPHEN 500 MG PO TABS
1000.0000 mg | ORAL_TABLET | Freq: Once | ORAL | Status: AC
  Administered 2019-05-02: 1000 mg via ORAL
  Filled 2019-05-02: qty 2

## 2019-05-02 NOTE — ED Notes (Signed)
Snack and coffee provided

## 2019-05-02 NOTE — ED Provider Notes (Signed)
[facility] EMERGENCY DEPARTMENT Provider Note   CSN: [id number] Arrival date & time: 05/02/19  2159     History Chief Complaint  Patient presents with  . homelessness    Betty Henderson is a 59 y.o. female.  Patient is a 59 year old female who presents with leg pain.  She is currently homeless.  She also has a history of schizoaffective disorder, malingering and drug-seeking behavior.  She has a history of osteoarthritis and says that the cold has exacerbated her leg pain.  Is been going on for the last few days.  She denies any fevers.  She has not taken anything for the pain.  She denies any recent injuries.  She was actually seen here in the emergency room last night for abdominal pain and diarrhea.  Her labs are nonconcerning.        Past Medical History:  Diagnosis Date  . Anemia   . Arthritis   . Chronic pain   . Drug-seeking behavior   . Malingering   . Osteopetrosis   . Psychosis ([place])   . Schizoaffective disorder, bipolar type ([place])     There are no problems to display for this patient.   Past Surgical History:  Procedure Laterality Date  . MOUTH SURGERY    . TUBAL LIGATION       OB History   No obstetric history on file.     Family History  Family history unknown: Yes    Social History   Tobacco Use  . Smoking status: Current Some Day Smoker    Packs/day: 1.00    Types: Cigarettes  . Smokeless tobacco: Never Used  Substance Use Topics  . Alcohol use: Yes  . Drug use: Yes    Types: Cocaine    Comment: states "it's legal though"    Home Medications Prior to Admission medications   Medication Sig Start Date End Date Taking? Authorizing Provider  benzonatate (TESSALON) 100 MG capsule Take 1 capsule (100 mg total) by mouth every 8 (eight) hours. Patient not taking: Reported on 04/28/2019 03/03/19   Caccavale, Sophia, PA-C  ibuprofen (ADVIL) 600 MG tablet Take 1 tablet (600 mg total) by mouth every 6 (six) hours as needed for up to  24 doses for mild pain or moderate pain. Do NOT take more than prescribed. Patient not taking: Reported on 04/28/2019 01/09/19   Wyvonnia Dusky, MD  meloxicam (MOBIC) 15 MG tablet Take 1 tablet (15 mg total) by mouth daily. Patient not taking: Reported on 04/28/2019 02/18/19 02/18/20  Fransico Meadow, PA-C  ranitidine (ZANTAC) 150 MG capsule Take 1 capsule (150 mg total) by mouth daily. Patient not taking: Reported on 03/20/2018 05/31/17 01/07/19  Lacretia Leigh, MD    Allergies    Lemon oil, Strawberry extract, Other, Adhesive  [tape], Cherry, and Proanthocyanidin  Review of Systems   Review of Systems  Constitutional: Negative for fever.  Gastrointestinal: Negative for vomiting.  Musculoskeletal: Positive for arthralgias. Negative for back pain.  Skin: Negative for rash and wound.  Neurological: Negative for weakness and numbness.  All other systems reviewed and are negative.   Physical Exam Updated Vital Signs BP 113/70 (BP Location: Right Arm)   Pulse 73   Temp 98.2 F (36.8 C) (Oral)   SpO2 100%   Physical Exam Constitutional:      Appearance: She is well-developed.  HENT:     Head: Normocephalic and atraumatic.  Eyes:     Pupils: Pupils are equal, round, and reactive  to light.  Cardiovascular:     Rate and Rhythm: Normal rate and regular rhythm.     Heart sounds: Normal heart sounds.  Pulmonary:     Effort: Pulmonary effort is normal. No respiratory distress.     Breath sounds: Normal breath sounds. No wheezing or rales.  Chest:     Chest wall: No tenderness.  Abdominal:     General: Bowel sounds are normal.     Palpations: Abdomen is soft.     Tenderness: There is no abdominal tenderness. There is no guarding or rebound.  Musculoskeletal:        General: Normal range of motion.     Cervical back: Normal range of motion and neck supple.     Comments: No tenderness on palpation or range of motion of her lower extremities.  No visible joint swelling.  No warmth or  erythema.  Pedal pulses are intact.  She has normal sensation and motor function distally.  Lymphadenopathy:     Cervical: No cervical adenopathy.  Skin:    General: Skin is warm and dry.     Findings: No rash.  Neurological:     Mental Status: She is alert and oriented to person, place, and time.  Psychiatric:     Comments: Patient is agitated at times     ED Results / Procedures / Treatments   Labs (all labs ordered are listed, but only abnormal results are displayed) Labs Reviewed - No data to display  EKG None  Radiology No results found.  Procedures Procedures (including critical care time)  Medications Ordered in ED Medications  acetaminophen (TYLENOL) tablet 1,000 mg (has no administration in time range)    ED Course  I have reviewed the triage vital signs and the nursing notes.  Pertinent labs & imaging results that were available during my care of the patient were reviewed by me and considered in my medical decision making (see chart for details).    MDM Rules/Calculators/A&P                      Patient presents with arthralgias from her osteoarthritis.  There is been no recent trauma.  I do not see any inflammation or signs of infection.  She was given a dose of Tylenol in the ED.  She says that she is [place] a church tonight.  We will try to get her a cab voucher back to [facility] which she is requesting. Final Clinical Impression(s) / ED Diagnoses Final diagnoses:  Arthralgia, unspecified joint    Rx / DC Orders ED Discharge Orders    None       Rolan Bucco, MD 05/02/19 2229

## 2019-05-02 NOTE — ED Triage Notes (Addendum)
Pt arrived via [facility]. [place] is homeless and [place] [facility]. [place], pt. asked to be transported to the ED. C/o pain to bilateral legs/feet to EMS. Pt was seen last night here in the ED for foot pain.

## 2019-05-02 NOTE — ED Provider Notes (Signed)
[facility] EMERGENCY DEPARTMENT Provider Note  CSN: [id number] Arrival date & time: 05/01/19 2304  Chief Complaint(s) Abdominal Pain (diarrhea ) and Pruritis  HPI Betty Henderson is a 59 y.o. female   The history is provided by the patient.  Diarrhea Quality:  Semi-solid Severity:  Moderate Onset quality:  Gradual Number of episodes:  Unsure Duration:  3 days Timing:  Intermittent Relieved by:  Nothing Worsened by:  Nothing Associated symptoms: abdominal pain   Associated symptoms: no chills, no fever and no vomiting   Abdominal pain:    Location:  Suprapubic   Quality: aching     Timing:  Intermittent   Past Medical History Past Medical History:  Diagnosis Date  . Anemia   . Arthritis   . Chronic pain   . Drug-seeking behavior   . Malingering   . Osteopetrosis   . Psychosis ([facility])   . Schizoaffective disorder, bipolar type ([facility])    There are no problems to display for this patient.  Home Medication(s) Prior to Admission medications   Medication Sig Start Date End Date Taking? Authorizing Provider  benzonatate (TESSALON) 100 MG capsule Take 1 capsule (100 mg total) by mouth every 8 (eight) hours. Patient not taking: Reported on 04/28/2019 03/03/19   Caccavale, Sophia, PA-C  ibuprofen (ADVIL) 600 MG tablet Take 1 tablet (600 mg total) by mouth every 6 (six) hours as needed for up to 24 doses for mild pain or moderate pain. Do NOT take more than prescribed. Patient not taking: Reported on 04/28/2019 01/09/19   Terald Sleeper, MD  meloxicam (MOBIC) 15 MG tablet Take 1 tablet (15 mg total) by mouth daily. Patient not taking: Reported on 04/28/2019 02/18/19 02/18/20  Elson Areas, PA-C  ranitidine (ZANTAC) 150 MG capsule Take 1 capsule (150 mg total) by mouth daily. Patient not taking: Reported on 03/20/2018 05/31/17 01/07/19  Lorre Nick, MD                  Past Surgical History Past Surgical History:  Procedure Laterality Date  . MOUTH SURGERY    . TUBAL LIGATION     Family History Family History  Family history unknown: Yes    Social History Social History   Tobacco Use  . Smoking status: Current Some Day Smoker    Packs/day: 1.00    Types: Cigarettes  . Smokeless tobacco: Never Used  Substance Use Topics  . Alcohol use: Yes  . Drug use: Yes    Types: Cocaine    Comment: states "it's legal though"   Allergies Lemon oil, Strawberry extract, Other, Adhesive  [tape], Cherry, and Proanthocyanidin  Review of Systems Review of Systems  Constitutional: Negative for chills and fever.  Gastrointestinal: Positive for abdominal pain and diarrhea. Negative for vomiting.   All other systems are reviewed and are negative for acute change except as noted in the HPI  Physical Exam Vital Signs  I have reviewed the triage vital signs BP 134/73 (BP Location: Right Arm)   Pulse 88   Temp 98.4 F (36.9 C) (Oral)   Resp 20   Ht 5\' 6"  (1.676 m)   Wt 72.6 kg   SpO2 100%   BMI 25.82 kg/m   Physical Exam Vitals reviewed.  Constitutional:      General: She is not in acute distress.    Appearance: She is well-developed. She is not diaphoretic.  HENT:     Head: Normocephalic and atraumatic.     Right Ear: External  ear normal.     Left Ear: External ear normal.     Nose: Nose normal.  Eyes:     General: No scleral icterus.    Conjunctiva/sclera: Conjunctivae normal.  Neck:     Trachea: Phonation normal.  Cardiovascular:     Rate and Rhythm: Normal rate and regular rhythm.  Pulmonary:     Effort: Pulmonary effort is normal. No respiratory distress.     Breath sounds: No stridor.  Abdominal:     General: There is no distension.     Tenderness: There is no abdominal tenderness.  Musculoskeletal:        General: Normal range of motion.     Cervical back: Normal range of motion.  Neurological:     Mental  Status: She is alert and oriented to person, place, and time.  Psychiatric:        Behavior: Behavior normal.     ED Results and Treatments Labs (all labs ordered are listed, but only abnormal results are displayed) Labs Reviewed  CBC WITH DIFFERENTIAL/PLATELET - Abnormal; Notable for the following components:      Result Value   RBC 3.69 (*)    Hemoglobin 11.0 (*)    HCT 34.3 (*)    All other components within normal limits  COMPREHENSIVE METABOLIC PANEL - Abnormal; Notable for the following components:   Potassium 3.4 (*)    Glucose, Bld 158 (*)    All other components within normal limits  URINALYSIS, ROUTINE W REFLEX MICROSCOPIC - Abnormal; Notable for the following components:   Specific Gravity, Urine <1.005 (*)    All other components within normal limits  LIPASE, BLOOD                                                                                                                         EKG  EKG Interpretation  Date/Time:    Ventricular Rate:    PR Interval:    QRS Duration:   QT Interval:    QTC Calculation:   R Axis:     Text Interpretation:        Radiology No results found.  Pertinent labs & imaging results that were available during my care of the patient were reviewed by me and considered in my medical decision making (see chart for details).  Medications Ordered in ED Medications  ondansetron (ZOFRAN-ODT) disintegrating tablet 4 mg (4 mg Oral Given 05/01/19 2327)  Procedures Procedures  (including critical care time)  Medical Decision Making / ED Course I have reviewed the nursing notes for this encounter and the patient's prior records (if available in EHR or on provided paperwork).   Betty Henderson was evaluated in Emergency Department on 05/02/2019 for the symptoms described in the history of present illness. She  was evaluated in the context of the global COVID-19 pandemic, which necessitated consideration that the patient might be at risk for infection with the SARS-CoV-2 virus that causes COVID-19. Institutional protocols and algorithms that pertain to the evaluation of patients at risk for COVID-19 are in a state of rapid change based on information released by regulatory bodies including the CDC and federal and state organizations. These policies and algorithms were followed during the patient's care in the ED.   Abdomen benign. Labs reassuring without leukocytosis.  No significant electrolyte derangement or renal insufficiency. UA without evidence of infection. No bowel movements here in the emergency department. Tolerated oral intake.  The patient appears reasonably screened and/or stabilized for discharge and I doubt any other medical condition or other [facility] requiring further screening, evaluation, or treatment in the ED at this time prior to discharge.  The patient is safe for discharge with strict return precautions.        Final Clinical Impression(s) / ED Diagnoses Final diagnoses:  Lower abdominal pain    The patient appears reasonably screened and/or stabilized for discharge and I doubt any other medical condition or other [facility] requiring further screening, evaluation, or treatment in the ED at this time prior to discharge.  Disposition: Discharge  Condition: Good  I have discussed the results, Dx and Tx plan with the patient who expressed understanding and agree(s) with the plan. Discharge instructions discussed at great length. The patient was given strict return precautions who verbalized understanding of the instructions. No further questions at time of discharge.    ED Discharge Orders    None          This chart was dictated using voice recognition software.  Despite best efforts to proofread,  errors can occur which can change the documentation meaning.   Nira Conn, MD 05/02/19 [phone]

## 2019-05-06 ENCOUNTER — Emergency Department (HOSPITAL_BASED_OUTPATIENT_CLINIC_OR_DEPARTMENT_OTHER)
Admission: EM | Admit: 2019-05-06 | Discharge: 2019-05-06 | Disposition: A | Discharge status: Home / Self Care | Payer: Self-pay | Attending: Emergency Medicine | Admitting: Emergency Medicine

## 2019-05-06 ENCOUNTER — Encounter (HOSPITAL_BASED_OUTPATIENT_CLINIC_OR_DEPARTMENT_OTHER): Payer: Self-pay | Admitting: *Deleted

## 2019-05-06 ENCOUNTER — Other Ambulatory Visit: Payer: Self-pay

## 2019-05-06 DIAGNOSIS — F17210 Nicotine dependence, cigarettes, uncomplicated: Secondary | ICD-10-CM | POA: Insufficient documentation

## 2019-05-06 DIAGNOSIS — F250 Schizoaffective disorder, bipolar type: Secondary | ICD-10-CM | POA: Insufficient documentation

## 2019-05-06 DIAGNOSIS — Z79899 Other long term (current) drug therapy: Secondary | ICD-10-CM | POA: Insufficient documentation

## 2019-05-06 DIAGNOSIS — Z765 Malingerer [conscious simulation]: Secondary | ICD-10-CM | POA: Insufficient documentation

## 2019-05-06 DIAGNOSIS — Z5900 Homelessness unspecified: Secondary | ICD-10-CM

## 2019-05-06 MED ORDER — ACETAMINOPHEN 325 MG PO TABS
650.00 | ORAL_TABLET | ORAL | Status: DC
Start: ? — End: 2019-05-06

## 2019-05-06 NOTE — ED Triage Notes (Signed)
Brought in by EMS from [facility] for arthritis pain

## 2019-05-06 NOTE — ED Triage Notes (Signed)
Pt brought by ems states she is here for a blanket and something to eat

## 2019-05-06 NOTE — ED Provider Notes (Signed)
[facility] EMERGENCY DEPARTMENT Provider Note   CSN: [id number] Arrival date & time: 05/06/19  1646     History Chief Complaint  Patient presents with  . Homeless    Betty Henderson is a 59 y.o. female.  Patient presents to ED via EMS. Patient did not voice any specific complaint with EMS. The patient told the nurse she came to the ED because she wanted a warm blanket and something to eat. When I inquired why the patient came to the hospital, she states "I need 24 hours of R&R". She is a poor historian and refuses to answer most questions. Patient has history of homelessness, schizophrenia, chronic pain, and malingering. Epic review shows patient has frequented the ED's of [facility] and [facility] numerous times over the last 9 days (04/27/19, 04/28/19, 05/01/19, 05/02/19, 05/03/19, 05/04/19). She had labs drawn on 05/01/19 that were unremarkable.  The history is provided by the patient, medical records and the EMS personnel.       Past Medical History:  Diagnosis Date  . Anemia   . Arthritis   . Chronic pain   . Drug-seeking behavior   . Malingering   . Osteopetrosis   . Psychosis ([facility])   . Schizoaffective disorder, bipolar type ([facility])     There are no problems to display for this patient.   Past Surgical History:  Procedure Laterality Date  . MOUTH SURGERY    . TUBAL LIGATION       OB History   No obstetric history on file.     Family History  Family history unknown: Yes    Social History   Tobacco Use  . Smoking status: Current Some Day Smoker    Packs/day: 1.00    Types: Cigarettes  . Smokeless tobacco: Never Used  Substance Use Topics  . Alcohol use: Yes  . Drug use: Yes    Types: Cocaine    Comment: states "it's legal though"    Home Medications Prior to Admission medications   Medication Sig Start Date End Date Taking? Authorizing Provider  benzonatate (TESSALON) 100 MG capsule Take 1 capsule (100 mg total) by mouth every 8 (eight)  hours. Patient not taking: Reported on 04/28/2019 03/03/19   Caccavale, Sophia, PA-C  ibuprofen (ADVIL) 600 MG tablet Take 1 tablet (600 mg total) by mouth every 6 (six) hours as needed for up to 24 doses for mild pain or moderate pain. Do NOT take more than prescribed. Patient not taking: Reported on 04/28/2019 01/09/19   Terald Sleeper, MD  meloxicam (MOBIC) 15 MG tablet Take 1 tablet (15 mg total) by mouth daily. Patient not taking: Reported on 04/28/2019 02/18/19 02/18/20  Elson Areas, PA-C  ranitidine (ZANTAC) 150 MG capsule Take 1 capsule (150 mg total) by mouth daily. Patient not taking: Reported on 03/20/2018 05/31/17 01/07/19  Lorre Nick, MD    Allergies    Lemon oil, Strawberry extract, Other, Adhesive  [tape], Cherry, and Proanthocyanidin  Review of Systems   Review of Systems  Unable to perform ROS: Other    Physical Exam Updated Vital Signs BP 114/71 (BP Location: Left Arm)   Pulse 70   Temp 98.2 F (36.8 C) (Oral)   Resp 14   Ht 5\' 3"  (1.6 m)   SpO2 100%   BMI 28.34 kg/m   Physical Exam Vitals and nursing note reviewed.  Constitutional:      General: She is not in acute distress.    Appearance: She is not  ill-appearing.  HENT:     Head: Atraumatic.  Eyes:     Conjunctiva/sclera: Conjunctivae normal.  Cardiovascular:     Rate and Rhythm: Normal rate and regular rhythm.     Pulses: Normal pulses.  Pulmonary:     Effort: Pulmonary effort is normal.     Breath sounds: Normal breath sounds.  Abdominal:     Palpations: Abdomen is soft.  Musculoskeletal:        General: No swelling. Normal range of motion.  Skin:    General: Skin is warm and dry.  Psychiatric:        Mood and Affect: Mood normal.        Behavior: Behavior is uncooperative.     ED Results / Procedures / Treatments   Labs (all labs ordered are listed, but only abnormal results are displayed) Labs Reviewed - No data to display  EKG None  Radiology No results  found.  Procedures Procedures (including critical care time)  Medications Ordered in ED Medications - No data to display  ED Course  I have reviewed the triage vital signs and the nursing notes.  Pertinent labs & imaging results that were available during my care of the patient were reviewed by me and considered in my medical decision making (see chart for details).    MDM Rules/Calculators/A&P                      No acute findings on exam. Afebrile. Not hypo/hypertensive. No indication of infection. Patient provided hot meal. Patient has recently received information for community assistance agencies. Final Clinical Impression(s) / ED Diagnoses Final diagnoses:  Malingering  Homelessness    Rx / DC Orders ED Discharge Orders    None       Etta Quill, NP 05/06/19 2112    Drenda Freeze, MD 05/06/19 2245

## 2019-05-06 NOTE — ED Notes (Signed)
Escorted out of the dept by [employer] officer and security. Pt yelling and cursing as she left.

## 2019-05-06 NOTE — ED Notes (Signed)
[employer] at bedside with pt, pt refuses to get off the stretcher, get dresses.

## 2019-05-06 NOTE — ED Notes (Signed)
Attempted to discharge pt, pt she refuses to move and sts she is not leaving

## 2019-05-13 ENCOUNTER — Encounter (HOSPITAL_COMMUNITY): Payer: Self-pay | Admitting: *Deleted

## 2019-05-13 ENCOUNTER — Other Ambulatory Visit: Payer: Self-pay

## 2019-05-13 ENCOUNTER — Emergency Department (HOSPITAL_COMMUNITY)
Admission: EM | Admit: 2019-05-13 | Discharge: 2019-05-13 | Disposition: A | Discharge status: Home / Self Care | Payer: Self-pay | Attending: Emergency Medicine | Admitting: Emergency Medicine

## 2019-05-13 DIAGNOSIS — G8929 Other chronic pain: Secondary | ICD-10-CM | POA: Insufficient documentation

## 2019-05-13 DIAGNOSIS — F17210 Nicotine dependence, cigarettes, uncomplicated: Secondary | ICD-10-CM | POA: Insufficient documentation

## 2019-05-13 DIAGNOSIS — M79605 Pain in left leg: Secondary | ICD-10-CM | POA: Insufficient documentation

## 2019-05-13 DIAGNOSIS — M79604 Pain in right leg: Secondary | ICD-10-CM | POA: Insufficient documentation

## 2019-05-13 MED ORDER — ACETAMINOPHEN 325 MG PO TABS
650.0000 mg | ORAL_TABLET | Freq: Once | ORAL | Status: AC
  Administered 2019-05-13: 650 mg via ORAL
  Filled 2019-05-13: qty 2

## 2019-05-13 NOTE — ED Triage Notes (Signed)
Pt says that she is having pain in both of her legs and feet, that she has arthritis, and the pain is better when it is warm outside.

## 2019-05-13 NOTE — ED Provider Notes (Signed)
[facility] EMERGENCY DEPARTMENT Provider Note   CSN: [id number] Arrival date & time: 05/13/19  2046     History Chief Complaint  Patient presents with  . Leg Pain    Betty Henderson is a 59 y.o. female.  59 year old female presents with complaint of chronic pain in her legs.  Patient states that she has arthritis in the cold weather bothers her legs.  Patient denies any falls or injuries or changes in her chronic pain pattern.  No other complaints or concerns tonight.        Past Medical History:  Diagnosis Date  . Anemia   . Arthritis   . Chronic pain   . Drug-seeking behavior   . Malingering   . Osteopetrosis   . Psychosis ([facility])   . Schizoaffective disorder, bipolar type ([facility])     There are no problems to display for this patient.   Past Surgical History:  Procedure Laterality Date  . MOUTH SURGERY    . TUBAL LIGATION       OB History   No obstetric history on file.     Family History  Family history unknown: Yes    Social History   Tobacco Use  . Smoking status: Current Some Day Smoker    Packs/day: 1.00    Types: Cigarettes  . Smokeless tobacco: Never Used  Substance Use Topics  . Alcohol use: Yes  . Drug use: Yes    Types: Cocaine    Comment: states "it's legal though"    Home Medications Prior to Admission medications   Medication Sig Start Date End Date Taking? Authorizing Provider  benzonatate (TESSALON) 100 MG capsule Take 1 capsule (100 mg total) by mouth every 8 (eight) hours. Patient not taking: Reported on 04/28/2019 03/03/19   Caccavale, Sophia, PA-C  ibuprofen (ADVIL) 600 MG tablet Take 1 tablet (600 mg total) by mouth every 6 (six) hours as needed for up to 24 doses for mild pain or moderate pain. Do NOT take more than prescribed. Patient not taking: Reported on 04/28/2019 01/09/19   Terald Sleeper, MD  meloxicam (MOBIC) 15 MG tablet Take 1 tablet (15 mg total) by mouth daily. Patient not taking: Reported  on 04/28/2019 02/18/19 02/18/20  Elson Areas, PA-C  ranitidine (ZANTAC) 150 MG capsule Take 1 capsule (150 mg total) by mouth daily. Patient not taking: Reported on 03/20/2018 05/31/17 01/07/19  Lorre Nick, MD    Allergies    Lemon oil, Strawberry extract, Other, Adhesive  [tape], Cherry, and Proanthocyanidin  Review of Systems   Review of Systems  Constitutional: Negative for fever.  Musculoskeletal: Positive for arthralgias. Negative for gait problem.  Skin: Negative for wound.  Neurological: Negative for weakness.    Physical Exam Updated Vital Signs BP 125/72 (BP Location: Right Arm)   Pulse 60   Temp 97.8 F (36.6 C) (Oral)   Resp 16   SpO2 98%   Physical Exam Vitals and nursing note reviewed.  Constitutional:      General: She is not in acute distress.    Appearance: She is well-developed. She is not diaphoretic.  HENT:     Head: Normocephalic and atraumatic.  Cardiovascular:     Pulses: Normal pulses.  Pulmonary:     Effort: Pulmonary effort is normal.  Skin:    General: Skin is warm and dry.     Capillary Refill: Capillary refill takes less than 2 seconds.     Findings: No erythema or rash.  Comments: Socks removed, skin intact, DP pulses present.  Neurological:     Mental Status: She is alert and oriented to person, place, and time.     Sensory: No sensory deficit.  Psychiatric:        Behavior: Behavior normal.     ED Results / Procedures / Treatments   Labs (all labs ordered are listed, but only abnormal results are displayed) Labs Reviewed - No data to display  EKG None  Radiology No results found.  Procedures Procedures (including critical care time)  Medications Ordered in ED Medications  acetaminophen (TYLENOL) tablet 650 mg (has no administration in time range)    ED Course  I have reviewed the triage vital signs and the nursing notes.  Pertinent labs & imaging results that were available during my care of the patient were  reviewed by me and considered in my medical decision making (see chart for details).  Clinical Course as of May 12 2152  Thu May 12, 4256  2257 59 year old female presents with complaint of chronic pain in her lower legs.  On exam, legs appear normal, no pain with palpation.  Patient is ambulatory with a steady gait.  Skin is intact, DP pulses are present, sensation intact.  Patient will be given Tylenol for her pain and discharged.   [LM]    Clinical Course User Index [LM] Roque Lias   MDM Rules/Calculators/A&P                      Final Clinical Impression(s) / ED Diagnoses Final diagnoses:  Bilateral leg pain    Rx / DC Orders ED Discharge Orders    None       Roque Lias 05/13/19 2154    Wyvonnia Dusky, MD 05/14/19 [phone]

## 2019-05-13 NOTE — ED Notes (Signed)
Pt was able to walk to bathroom. Pt has 2+ pedal pulses bilat.

## 2019-05-21 ENCOUNTER — Emergency Department (HOSPITAL_BASED_OUTPATIENT_CLINIC_OR_DEPARTMENT_OTHER)
Admission: EM | Admit: 2019-05-21 | Discharge: 2019-05-21 | Disposition: A | Discharge status: Home / Self Care | Payer: Self-pay | Attending: Emergency Medicine | Admitting: Emergency Medicine

## 2019-05-21 ENCOUNTER — Other Ambulatory Visit: Payer: Self-pay

## 2019-05-21 ENCOUNTER — Encounter (HOSPITAL_BASED_OUTPATIENT_CLINIC_OR_DEPARTMENT_OTHER): Payer: Self-pay

## 2019-05-21 DIAGNOSIS — M79605 Pain in left leg: Secondary | ICD-10-CM | POA: Insufficient documentation

## 2019-05-21 DIAGNOSIS — Z91018 Allergy to other foods: Secondary | ICD-10-CM | POA: Insufficient documentation

## 2019-05-21 DIAGNOSIS — G8929 Other chronic pain: Secondary | ICD-10-CM | POA: Insufficient documentation

## 2019-05-21 DIAGNOSIS — Z888 Allergy status to other drugs, medicaments and biological substances status: Secondary | ICD-10-CM | POA: Insufficient documentation

## 2019-05-21 DIAGNOSIS — M542 Cervicalgia: Secondary | ICD-10-CM | POA: Insufficient documentation

## 2019-05-21 DIAGNOSIS — M79604 Pain in right leg: Secondary | ICD-10-CM | POA: Insufficient documentation

## 2019-05-21 DIAGNOSIS — F17210 Nicotine dependence, cigarettes, uncomplicated: Secondary | ICD-10-CM | POA: Insufficient documentation

## 2019-05-21 MED ORDER — ACETAMINOPHEN 325 MG PO TABS
650.0000 mg | ORAL_TABLET | Freq: Once | ORAL | Status: AC
  Administered 2019-05-21: 650 mg via ORAL
  Filled 2019-05-21: qty 2

## 2019-05-21 NOTE — ED Provider Notes (Signed)
[facility] EMERGENCY DEPARTMENT Provider Note   CSN: [id number] Arrival date & time: 05/21/19  1800     History Chief Complaint  Patient presents with  . Leg Pain    Betty Henderson is a 59 y.o. female.  Presents to ER chief complaint neck pain, leg pain.  Patient states that she has had back pain for many years, she has not had any recent changes in her any medication = pain.  Not having any pain.  No recent falls.  No numbness, weakness, saddle anesthesia, bladder or bowel incontinence.  No SI, HI, AVH.  Completed chart review, multiple ER visits in [place] and and [facility] - 14 in total in February. All for similar.    HPI     Past Medical History:  Diagnosis Date  . Anemia   . Arthritis   . Chronic pain   . Drug-seeking behavior   . Malingering   . Osteopetrosis   . Psychosis (HCC)   . Schizoaffective disorder, bipolar type (HCC)     There are no problems to display for this patient.   Past Surgical History:  Procedure Laterality Date  . MOUTH SURGERY    . TUBAL LIGATION       OB History   No obstetric history on file.     Family History  Family history unknown: Yes    Social History   Tobacco Use  . Smoking status: Current Some Day Smoker    Packs/day: 1.00    Types: Cigarettes  . Smokeless tobacco: Never Used  Substance Use Topics  . Alcohol use: Yes  . Drug use: Yes    Types: Cocaine    Comment: states "it's legal though"    Home Medications Prior to Admission medications   Medication Sig Start Date End Date Taking? Authorizing Provider  benzonatate (TESSALON) 100 MG capsule Take 1 capsule (100 mg total) by mouth every 8 (eight) hours. Patient not taking: Reported on 04/28/2019 03/03/19   Caccavale, Sophia, PA-C  ibuprofen (ADVIL) 600 MG tablet Take 1 tablet (600 mg total) by mouth every 6 (six) hours as needed for up to 24 doses for mild pain or moderate pain. Do NOT take more than prescribed. Patient not  taking: Reported on 04/28/2019 01/09/19   Terald Sleeper, MD  meloxicam (MOBIC) 15 MG tablet Take 1 tablet (15 mg total) by mouth daily. Patient not taking: Reported on 04/28/2019 02/18/19 02/18/20  Elson Areas, PA-C  ranitidine (ZANTAC) 150 MG capsule Take 1 capsule (150 mg total) by mouth daily. Patient not taking: Reported on 03/20/2018 05/31/17 01/07/19  Lorre Nick, MD    Allergies    Lemon oil, Strawberry extract, Other, Adhesive  [tape], Cherry, and Proanthocyanidin  Review of Systems   Review of Systems  Constitutional: Negative for chills and fever.  HENT: Negative for ear pain and sore throat.   Eyes: Negative for pain and visual disturbance.  Respiratory: Negative for cough and shortness of breath.   Cardiovascular: Negative for chest pain and palpitations.  Gastrointestinal: Negative for abdominal pain and vomiting.  Genitourinary: Negative for dysuria and hematuria.  Musculoskeletal: Positive for back pain. Negative for arthralgias.  Skin: Negative for color change and rash.  Neurological: Negative for seizures and syncope.  All other systems reviewed and are negative.   Physical Exam Updated Vital Signs BP (!) 127/112 (BP Location: Right Arm)   Pulse 68   Temp 97.9 F (36.6 C) (Oral)   Resp  18   Ht 5\' 3"  (1.6 m)   Wt 72.6 kg   SpO2 100%   BMI 28.34 kg/m   Physical Exam Vitals and nursing note reviewed.  Constitutional:      General: She is not in acute distress.    Appearance: She is well-developed.     Comments: Disheveled  HENT:     Head: Normocephalic and atraumatic.  Eyes:     Conjunctiva/sclera: Conjunctivae normal.  Cardiovascular:     Rate and Rhythm: Normal rate and regular rhythm.     Heart sounds: No murmur.  Pulmonary:     Effort: Pulmonary effort is normal. No respiratory distress.     Breath sounds: Normal breath sounds.  Abdominal:     Palpations: Abdomen is soft.     Tenderness: There is no abdominal tenderness.    Musculoskeletal:     Cervical back: Neck supple.     Comments: No tenderness to palpation over C, T, L-spine No tenderness to palpation, no deformity over all 4 extremities  Skin:    General: Skin is warm and dry.  Neurological:     Mental Status: She is alert.     Comments: 5 out of 5 strength in lower extremities, sensation to light touch intact in lower extremities  Psychiatric:        Mood and Affect: Mood normal.        Behavior: Behavior normal.     ED Results / Procedures / Treatments   Labs (all labs ordered are listed, but only abnormal results are displayed) Labs Reviewed - No data to display  EKG None  Radiology No results found.  Procedures Procedures (including critical care time)  Medications Ordered in ED Medications  acetaminophen (TYLENOL) tablet 650 mg (650 mg Oral Given 05/21/19 1843)    ED Course  I have reviewed the triage vital signs and the nursing notes.  Pertinent labs & imaging results that were available during my care of the patient were reviewed by me and considered in my medical decision making (see chart for details).    MDM Rules/Calculators/A&P                      59 year old lady who presented to ER with complaint of back pain.  Patient reports this is chronic, has no acute changes.  She does not have any red flag symptoms at this time.  Per chart review, multiple recent visits for similar.  On further questioning, patient stated she did not want any testing or treatment but just wanted a place to stay.  Provided patient with some snacks and information for local shelters.  Discharged.    After the discussed management above, the patient was determined to be safe for discharge.  The patient was in agreement with this plan and all questions regarding their care were answered.  ED return precautions were discussed and the patient will return to the ED with any significant worsening of condition.  Final Clinical Impression(s) / ED  Diagnoses Final diagnoses:  Other chronic pain    Rx / DC Orders ED Discharge Orders    None       Lucrezia Starch, MD 05/21/19 2340

## 2019-05-21 NOTE — Discharge Instructions (Addendum)
Recommend Tylenol and Motrin as needed for pain control.  Recommend follow-up with primary doctor regarding chronic medical problems and chronic back pain.  Return to ER if you develop fever, numbness, weakness, other new concerning symptom.

## 2019-05-21 NOTE — ED Triage Notes (Signed)
Pt arrives [facility] from [facility] with c/o pain in spine and both legs.

## 2019-05-21 NOTE — ED Notes (Signed)
ED Provider at bedside. 

## 2019-05-21 NOTE — ED Notes (Signed)
Patient was very upset.  She wanted to get a cab voucher.  Charge nurse notified of patient's request.  Unable to give voucher at this time per Charge Nurse.  I tried to call her 2 boys, Clide Cliff and Casimiro Needle without success.  Informed her that I can not get in touch with Clide Cliff or Casimiro Needle.  She stated, "I could have told you that."  Patient was cussing the whole entire time when I walked her out of her room.  Per Charge Nurse, patient asked EDP if she can spent a night her at the lobby with negative response.

## 2019-05-25 ENCOUNTER — Other Ambulatory Visit: Payer: Self-pay

## 2019-05-25 ENCOUNTER — Emergency Department (HOSPITAL_COMMUNITY)
Admission: EM | Admit: 2019-05-25 | Discharge: 2019-05-25 | Disposition: A | Discharge status: Home / Self Care | Payer: Self-pay | Attending: Emergency Medicine | Admitting: Emergency Medicine

## 2019-05-25 ENCOUNTER — Encounter (HOSPITAL_COMMUNITY): Payer: Self-pay

## 2019-05-25 DIAGNOSIS — R5383 Other fatigue: Secondary | ICD-10-CM | POA: Insufficient documentation

## 2019-05-25 DIAGNOSIS — F17210 Nicotine dependence, cigarettes, uncomplicated: Secondary | ICD-10-CM | POA: Insufficient documentation

## 2019-05-25 NOTE — ED Provider Notes (Signed)
[facility] EMERGENCY DEPARTMENT Provider Note   CSN: [id number] Arrival date & time: 05/25/19  1625     History Chief Complaint  Patient presents with  . Diarrhea  . Abdominal Pain    Betty Henderson is a 59 y.o. female.  HPI      Betty Henderson is a 59 y.o. female, with a history of anemia, chronic pain, drug-seeking behavior, malingering, schizophrenia, presenting to the ED with chief complaint of fatigue.  She would not detail how long she has been fatigued. She also states her feet hurt from walking. She asks for coffee and a sandwich. Denies fever/chills, cough, shortness of breath, chest pain, abdominal pain, N/V/D, numbness, weakness, falls/trauma, or any other complaints.    Past Medical History:  Diagnosis Date  . Anemia   . Arthritis   . Chronic pain   . Drug-seeking behavior   . Malingering   . Osteopetrosis   . Psychosis ([place])   . Schizoaffective disorder, bipolar type ([place])     There are no problems to display for this patient.   Past Surgical History:  Procedure Laterality Date  . MOUTH SURGERY    . TUBAL LIGATION       OB History   No obstetric history on file.     Family History  Family history unknown: Yes    Social History   Tobacco Use  . Smoking status: Current Some Day Smoker    Packs/day: 1.00    Types: Cigarettes  . Smokeless tobacco: Never Used  Substance Use Topics  . Alcohol use: Yes  . Drug use: Yes    Types: Cocaine    Comment: states "it's legal though"    Home Medications Prior to Admission medications   Medication Sig Start Date End Date Taking? Authorizing Provider  benzonatate (TESSALON) 100 MG capsule Take 1 capsule (100 mg total) by mouth every 8 (eight) hours. Patient not taking: Reported on 04/28/2019 03/03/19   Caccavale, Sophia, PA-C  ibuprofen (ADVIL) 600 MG tablet Take 1 tablet (600 mg total) by mouth every 6 (six) hours as needed for up to 24 doses for mild pain or moderate  pain. Do NOT take more than prescribed. Patient not taking: Reported on 04/28/2019 01/09/19   Wyvonnia Dusky, MD  meloxicam (MOBIC) 15 MG tablet Take 1 tablet (15 mg total) by mouth daily. Patient not taking: Reported on 04/28/2019 02/18/19 02/18/20  Fransico Meadow, PA-C  ranitidine (ZANTAC) 150 MG capsule Take 1 capsule (150 mg total) by mouth daily. Patient not taking: Reported on 03/20/2018 05/31/17 01/07/19  Lacretia Leigh, MD    Allergies    Lemon oil, Strawberry extract, Other, Adhesive  [tape], Cherry, and Proanthocyanidin  Review of Systems   Review of Systems  Constitutional: Positive for fatigue. Negative for fever.  Respiratory: Negative for cough and shortness of breath.   Cardiovascular: Negative for chest pain and leg swelling.  Gastrointestinal: Negative for abdominal pain, diarrhea, nausea and vomiting.  Musculoskeletal: Positive for arthralgias. Negative for back pain.  Neurological: Negative for weakness and numbness.  All other systems reviewed and are negative.   Physical Exam Updated Vital Signs BP 128/77   Pulse 80   Temp 98.5 F (36.9 C) (Oral)   Resp 18   Ht 5\' 6"  (1.676 m)   Wt 73 kg   SpO2 96%   BMI 25.98 kg/m   Physical Exam Vitals and nursing note reviewed.  Constitutional:      General: She  is not in acute distress.    Appearance: She is well-developed. She is not diaphoretic.  HENT:     Head: Normocephalic and atraumatic.     Mouth/Throat:     Mouth: Mucous membranes are moist.     Pharynx: Oropharynx is clear.  Eyes:     Conjunctiva/sclera: Conjunctivae normal.  Cardiovascular:     Rate and Rhythm: Normal rate and regular rhythm.     Pulses: Normal pulses.          Radial pulses are 2+ on the right side and 2+ on the left side.       Posterior tibial pulses are 2+ on the right side and 2+ on the left side.     Comments: Tactile temperature in the extremities appropriate and equal bilaterally. Pulmonary:     Effort: Pulmonary effort is  normal. No respiratory distress.     Breath sounds: Normal breath sounds.  Abdominal:     Palpations: Abdomen is soft.     Tenderness: There is no abdominal tenderness. There is no guarding.  Musculoskeletal:     Cervical back: Neck supple.     Right lower leg: No edema.     Left lower leg: No edema.     Comments: Patient's feet and lower legs were examined.  No tenderness, swelling, color change, or deformity noted.  Full range of motion in the toes, ankles, and knees.  Lymphadenopathy:     Cervical: No cervical adenopathy.  Skin:    General: Skin is warm and dry.  Neurological:     Mental Status: She is alert.     Comments: Sensation to light touch grossly intact in the lower extremities. Strength 5/5 in the lower extremities bilaterally. Ambulatory without assistance.  Psychiatric:        Mood and Affect: Mood and affect normal.        Speech: Speech normal.        Behavior: Behavior normal.     ED Results / Procedures / Treatments   Labs (all labs ordered are listed, but only abnormal results are displayed) Labs Reviewed  SARS CORONAVIRUS 2 (TAT 6-24 HRS)    EKG None  Radiology No results found.  Procedures Procedures (including critical care time)  Medications Ordered in ED Medications - No data to display  ED Course  I have reviewed the triage vital signs and the nursing notes.  Pertinent labs & imaging results that were available during my care of the patient were reviewed by me and considered in my medical decision making (see chart for details).    MDM Rules/Calculators/A&P                      Patient presents with a few complaints.  For her complaint of fatigue, I offered COVID-19 testing.  She initially accepted, but then refused the swab.  I recommended she follow-up with her PCP for any further evaluation of her fatigue. I did not find any abnormalities noted on physical exam of her feet. I was able to provide her with coffee and sandwich, as  requested.    Final Clinical Impression(s) / ED Diagnoses Final diagnoses:  Fatigue, unspecified type    Rx / DC Orders ED Discharge Orders    None       Concepcion Living 05/26/19 1610    Melene Plan, DO 05/26/19 1743

## 2019-05-25 NOTE — ED Notes (Signed)
Patient verbalizes understanding of discharge instructions. Opportunity for questioning and answers were provided. Armband removed by staff, pt discharged from ED ambulatory to home with cab voucher

## 2019-05-25 NOTE — ED Notes (Signed)
Pt refused to be testing for COVID-19; Burna Mortimer with Social Work contacted to provide a Electronics engineer for this pt.

## 2019-05-25 NOTE — Discharge Instructions (Signed)
Test Results for COVID-19 pending  You have a test pending for COVID-19.  Results typically return within about 48 hours.  Be sure to check MyChart for updated results.  We recommend isolating yourself until results are received.  Patients who have symptoms consistent with COVID-19 should self isolated for: At least 3 days (72 hours) have passed since recovery, defined as resolution of fever without the use of fever reducing medications and improvement in respiratory symptoms (e.g., cough, shortness of breath), and At least 7 days have passed since symptoms first appeared.  If you have no symptoms, but your test returns positive, recommend isolating for at least 10 days.  

## 2019-05-25 NOTE — ED Triage Notes (Signed)
Pt BIB [facility] for eval of abd pain x years and diarrhea for about 2 minutes prior to EMS arrival.

## 2019-06-05 ENCOUNTER — Other Ambulatory Visit: Payer: Self-pay

## 2019-06-05 ENCOUNTER — Encounter (HOSPITAL_BASED_OUTPATIENT_CLINIC_OR_DEPARTMENT_OTHER): Payer: Self-pay | Admitting: Emergency Medicine

## 2019-06-05 ENCOUNTER — Emergency Department (HOSPITAL_BASED_OUTPATIENT_CLINIC_OR_DEPARTMENT_OTHER)
Admission: EM | Admit: 2019-06-05 | Discharge: 2019-06-05 | Disposition: A | Discharge status: Home / Self Care | Payer: Self-pay | Attending: Emergency Medicine | Admitting: Emergency Medicine

## 2019-06-05 DIAGNOSIS — Z79899 Other long term (current) drug therapy: Secondary | ICD-10-CM | POA: Insufficient documentation

## 2019-06-05 DIAGNOSIS — K0889 Other specified disorders of teeth and supporting structures: Secondary | ICD-10-CM | POA: Insufficient documentation

## 2019-06-05 DIAGNOSIS — G8929 Other chronic pain: Secondary | ICD-10-CM | POA: Insufficient documentation

## 2019-06-05 DIAGNOSIS — K089 Disorder of teeth and supporting structures, unspecified: Secondary | ICD-10-CM

## 2019-06-05 DIAGNOSIS — M79604 Pain in right leg: Secondary | ICD-10-CM | POA: Insufficient documentation

## 2019-06-05 DIAGNOSIS — Z91018 Allergy to other foods: Secondary | ICD-10-CM | POA: Insufficient documentation

## 2019-06-05 DIAGNOSIS — Z91048 Other nonmedicinal substance allergy status: Secondary | ICD-10-CM | POA: Insufficient documentation

## 2019-06-05 DIAGNOSIS — Z888 Allergy status to other drugs, medicaments and biological substances status: Secondary | ICD-10-CM | POA: Insufficient documentation

## 2019-06-05 DIAGNOSIS — F17210 Nicotine dependence, cigarettes, uncomplicated: Secondary | ICD-10-CM | POA: Insufficient documentation

## 2019-06-05 DIAGNOSIS — M79605 Pain in left leg: Secondary | ICD-10-CM | POA: Insufficient documentation

## 2019-06-05 LAB — CBG MONITORING, ED: Glucose-Capillary: 111 mg/dL — ABNORMAL HIGH (ref 70–99)

## 2019-06-05 MED ORDER — ACETAMINOPHEN 325 MG PO TABS
650.0000 mg | ORAL_TABLET | Freq: Once | ORAL | Status: AC
  Administered 2019-06-05: 650 mg via ORAL
  Filled 2019-06-05: qty 2

## 2019-06-05 NOTE — ED Provider Notes (Signed)
[place] EMERGENCY DEPARTMENT Provider Note   CSN: [id number] Arrival date & time: 06/05/19  1947     History Chief Complaint  Patient presents with  . arthritis pain (BIL legs)    Betty Henderson is a 59 y.o. female.  Patient with history of schizoaffective disorder, malingering, frequent ED visits [place] emergency [facility] [place] [facility] with complaint of body aches.  Patient is complaining about pain in her legs.  She also reports dental pain.  No fevers, vomiting, diarrhea.  Patient has multiple ED visits here and at [facility] for similar complaints.  Usually treated with Tylenol.  Patient denies any treatments prior to arrival.  Patient is overall poor historian.        Past Medical History:  Diagnosis Date  . Anemia   . Arthritis   . Chronic pain   . Drug-seeking behavior   . Malingering   . Osteopetrosis   . Psychosis ([place])   . Schizoaffective disorder, bipolar type ([place])     There are no problems to display for this patient.   Past Surgical History:  Procedure Laterality Date  . MOUTH SURGERY    . TUBAL LIGATION       OB History   No obstetric history on file.     Family History  Family history unknown: Yes    Social History   Tobacco Use  . Smoking status: Current Some Day Smoker    Packs/day: 1.00    Types: Cigarettes  . Smokeless tobacco: Never Used  Substance Use Topics  . Alcohol use: Yes  . Drug use: Yes    Types: Cocaine    Comment: states "it's legal though"    Home Medications Prior to Admission medications   Medication Sig Start Date End Date Taking? Authorizing Provider  ranitidine (ZANTAC) 150 MG capsule Take 1 capsule (150 mg total) by mouth daily. Patient not taking: Reported on 03/20/2018 05/31/17 01/07/19  Lacretia Leigh, MD    Allergies    Lemon oil, Strawberry extract, Other, Adhesive  [tape], Cherry, and Proanthocyanidin  Review of Systems   Review of Systems    Constitutional: Negative for fever.  HENT: Positive for dental problem. Negative for rhinorrhea and sore throat.   Eyes: Negative for redness.  Respiratory: Negative for cough.   Cardiovascular: Negative for chest pain.  Gastrointestinal: Negative for abdominal pain, diarrhea, nausea and vomiting.  Genitourinary: Negative for dysuria.  Musculoskeletal: Positive for arthralgias and myalgias.  Skin: Negative for rash.  Neurological: Negative for headaches.    Physical Exam Updated Vital Signs BP 104/64   Pulse 81   Temp 98.3 F (36.8 C) (Oral)   Resp 14   Ht 5\' 6"  (1.676 m)   Wt 72.6 kg   SpO2 98%   BMI 25.82 kg/m   Physical Exam Vitals and nursing note reviewed.  Constitutional:      Appearance: She is well-developed.  HENT:     Head: Normocephalic and atraumatic.     Mouth/Throat:     Comments: Patient with very poor dentition with multiple missing teeth and other teeth that are cracked with large cavities.  Advanced periodontal disease.  No visible or gross abscess.  No facial swelling. Eyes:     General:        Right eye: No discharge.        Left eye: No discharge.     Conjunctiva/sclera: Conjunctivae normal.  Cardiovascular:     Rate and  Rhythm: Normal rate and regular rhythm.     Heart sounds: Normal heart sounds.  Pulmonary:     Effort: Pulmonary effort is normal. No respiratory distress.     Breath sounds: Normal breath sounds.  Abdominal:     Palpations: Abdomen is soft.     Tenderness: There is no abdominal tenderness.  Musculoskeletal:     Cervical back: Normal range of motion and neck supple.     Comments: I ranged all of the patient's joints of her upper and lower extremities.  Full range of motion without any pain or discomfort.  I do not visualize any swelling of any joints.  I do not see any skin rashes, abscesses, bruising or other abnormalities externally.  Patient has normal distal pulses in the upper and lower extremities.  Skin:    General:  Skin is warm and dry.  Neurological:     Mental Status: She is alert.     ED Results / Procedures / Treatments   Labs (all labs ordered are listed, but only abnormal results are displayed) Labs Reviewed  CBG MONITORING, ED - Abnormal; Notable for the following components:      Result Value   Glucose-Capillary 111 (*)    All other components within normal limits    EKG None  Radiology No results found.  Procedures Procedures (including critical care time)  Medications Ordered in ED Medications  acetaminophen (TYLENOL) tablet 650 mg (650 mg Oral Given 06/05/19 2108)    ED Course  I have reviewed the triage vital signs and the nursing notes.  Pertinent labs & imaging results that were available during my care of the patient were reviewed by me and considered in my medical decision making (see chart for details).  Patient seen and examined.  Patient presents with typical constellation of body pain symptoms without any other concerning features.  I do not see any concerning findings on her physical exam.  She states that she hurts more when it is cold out.  Poor dentition without active infection.  Patient will be given dose of Tylenol.  I reviewed patient's previous labs and work-up in some time she has hyperglycemia.  CBG tonight was 111.  Patient is asking for something to eat.  She will be discharged to home with PCP follow-up.  I do not suspect any emergent or life-threatening conditions at this point in time given current exam and history of previous complaints.  Vital signs reviewed and are as follows: BP 102/63   Pulse 62   Temp 98.3 F (36.8 C) (Oral)   Resp 14   Ht 5\' 6"  (1.676 m)   Wt 72.6 kg   SpO2 92%   BMI 25.82 kg/m        MDM Rules/Calculators/A&P                      Patient with generalized body aches and dental pain.  Vital signs are reassuring.  Patient is afebrile.  No tachycardia or tachypnea.  Blood pressure looks okay.  Blood sugar 111.   Physical exam without any objective findings tonight.  Patient will be discharged.   Final Clinical Impression(s) / ED Diagnoses Final diagnoses:  Pain in both lower extremities  Chronic dental pain    Rx / DC Orders ED Discharge Orders    None       06/05/19 2118    2119, MD 06/05/19 [phone]

## 2019-06-05 NOTE — Discharge Instructions (Signed)
You may take over-the-counter Tylenol for your pain.  Follow-up with your doctor next week for recheck if you do not feel better.

## 2019-06-05 NOTE — ED Notes (Signed)
Patient verbalizes understanding of discharge instructions. Opportunity for questioning and answers were provided. pt discharged from ED ambulatory.   

## 2019-06-05 NOTE — ED Triage Notes (Signed)
[facility] [place] [facility] c/o arthritis to BIL legs. VSS. Pt denies complaint other than arthritis pain in her legs.

## 2019-06-17 ENCOUNTER — Other Ambulatory Visit: Payer: Self-pay

## 2019-06-17 ENCOUNTER — Encounter (HOSPITAL_BASED_OUTPATIENT_CLINIC_OR_DEPARTMENT_OTHER): Payer: Self-pay | Admitting: Oncology

## 2019-06-17 ENCOUNTER — Emergency Department (HOSPITAL_BASED_OUTPATIENT_CLINIC_OR_DEPARTMENT_OTHER)
Admission: EM | Admit: 2019-06-17 | Discharge: 2019-06-17 | Disposition: A | Discharge status: Home / Self Care | Payer: Self-pay | Attending: Emergency Medicine | Admitting: Emergency Medicine

## 2019-06-17 DIAGNOSIS — F17210 Nicotine dependence, cigarettes, uncomplicated: Secondary | ICD-10-CM | POA: Insufficient documentation

## 2019-06-17 DIAGNOSIS — M79605 Pain in left leg: Secondary | ICD-10-CM | POA: Insufficient documentation

## 2019-06-17 DIAGNOSIS — M79604 Pain in right leg: Secondary | ICD-10-CM | POA: Insufficient documentation

## 2019-06-17 MED ORDER — DICLOFENAC SODIUM 1 % EX GEL: 4.0000 g | Freq: Four times a day (QID) | CUTANEOUS | 0 refills | Status: DC

## 2019-06-17 NOTE — ED Notes (Signed)
Patient discharged via [facility] to [place] [facility].

## 2019-06-17 NOTE — ED Provider Notes (Signed)
[facility] EMERGENCY DEPARTMENT Provider Note   CSN: [id number] Arrival date & time: 06/17/19  5176     History No chief complaint on file.   Betty Henderson is a 59 y.o. female.  59 yo F with a chief complaints of bilateral leg pain.  Occurs to the entire leg on both sides.  Feels like an aching pain.  Worse when she tries to walk.  She tells me at one point she was diagnosed with frostbite and she thinks it still there.  She would like an ointment to rub on her legs to help her with her pain.  Denies recent trauma.  Denies fevers.  The history is provided by the patient and the EMS personnel.  Illness Severity:  Moderate Onset quality:  Gradual Duration:  2 months Timing:  Intermittent Progression:  Waxing and waning Chronicity:  Recurrent Associated symptoms: myalgias   Associated symptoms: no chest pain, no congestion, no fever, no headaches, no nausea, no rhinorrhea, no shortness of breath, no vomiting and no wheezing        Past Medical History:  Diagnosis Date  . Anemia   . Arthritis   . Chronic pain   . Drug-seeking behavior   . Malingering   . Osteopetrosis   . Psychosis (HCC)   . Schizoaffective disorder, bipolar type (HCC)     There are no problems to display for this patient.   Past Surgical History:  Procedure Laterality Date  . MOUTH SURGERY    . TUBAL LIGATION       OB History   No obstetric history on file.     Family History  Family history unknown: Yes    Social History   Tobacco Use  . Smoking status: Current Some Day Smoker    Packs/day: 1.00    Types: Cigarettes  . Smokeless tobacco: Never Used  Substance Use Topics  . Alcohol use: Yes  . Drug use: Yes    Types: Cocaine    Comment: states "it's legal though"    Home Medications Prior to Admission medications   Medication Sig Start Date End Date Taking? Authorizing Provider  diclofenac Sodium (VOLTAREN) 1 % GEL Apply 4 g topically 4 (four) times daily.  06/17/19   Melene Plan, DO  ranitidine (ZANTAC) 150 MG capsule Take 1 capsule (150 mg total) by mouth daily. Patient not taking: Reported on 03/20/2018 05/31/17 01/07/19  Lorre Nick, MD    Allergies    Lemon oil, Strawberry extract, Other, Adhesive  [tape], Cherry, and Proanthocyanidin  Review of Systems   Review of Systems  Constitutional: Negative for chills and fever.  HENT: Negative for congestion and rhinorrhea.   Eyes: Negative for redness and visual disturbance.  Respiratory: Negative for shortness of breath and wheezing.   Cardiovascular: Negative for chest pain and palpitations.  Gastrointestinal: Negative for nausea and vomiting.  Genitourinary: Negative for dysuria and urgency.  Musculoskeletal: Positive for myalgias. Negative for arthralgias.  Skin: Negative for pallor and wound.  Neurological: Negative for dizziness and headaches.    Physical Exam Updated Vital Signs BP (!) 120/57 (BP Location: Right Arm)   Pulse 68   Temp 97.7 F (36.5 C) (Oral)   Resp 18   Ht 5\' 6"  (1.676 m)   Wt 72.6 kg   SpO2 100%   BMI 25.82 kg/m   Physical Exam Vitals and nursing note reviewed.  Constitutional:      General: She is not in acute distress.    Appearance:  She is well-developed. She is not diaphoretic.  HENT:     Head: Normocephalic and atraumatic.  Eyes:     Pupils: Pupils are equal, round, and reactive to light.  Cardiovascular:     Rate and Rhythm: Normal rate and regular rhythm.     Heart sounds: No murmur. No friction rub. No gallop.   Pulmonary:     Effort: Pulmonary effort is normal.     Breath sounds: No wheezing or rales.  Abdominal:     General: There is no distension.     Palpations: Abdomen is soft.     Tenderness: There is no abdominal tenderness.  Musculoskeletal:        General: No tenderness.     Cervical back: Normal range of motion and neck supple.     Comments: Mild skin thickening to the dorsum of the lateral digits bilaterally worse on the  right than the left.  No area of fluctuance erythema or warmth.  Intact pulse motor and sensation bilaterally.  Skin:    General: Skin is warm and dry.  Neurological:     Mental Status: She is alert and oriented to person, place, and time.  Psychiatric:        Behavior: Behavior normal.     ED Results / Procedures / Treatments   Labs (all labs ordered are listed, but only abnormal results are displayed) Labs Reviewed - No data to display  EKG None  Radiology No results found.  Procedures Procedures (including critical care time)  Medications Ordered in ED Medications - No data to display  ED Course  I have reviewed the triage vital signs and the nursing notes.  Pertinent labs & imaging results that were available during my care of the patient were reviewed by me and considered in my medical decision making (see chart for details).    MDM Rules/Calculators/A&P                      59 yo F with a chief complaints of bilateral leg pain.  The patient is well-known to this department and has had 29 visits in the past 6 months.  Is not her first visit for leg pain she is also had visits that have been diagnosed as malingering.  There is no obvious cause to her pain on my exam.  I am able to put her through a full range of motion with bilateral lower extremities without significant discomfort.  There is no obvious signs of infection.  Could be due to her neuropathy from her diabetes.  I suggested that she follow-up with a family physician.  3:48 AM:  I have discussed the diagnosis/risks/treatment options with the patient and believe the pt to be eligible for discharge home to follow-up with PCP. We also discussed returning to the ED immediately if new or worsening sx occur. We discussed the sx which are most concerning (e.g., sudden worsening pain, fever, inability to tolerate by mouth) that necessitate immediate return. Medications administered to the patient during their visit and  any new prescriptions provided to the patient are listed below.  Medications given during this visit Medications - No data to display   The patient appears reasonably screen and/or stabilized for discharge and I doubt any other medical condition or other [facility] requiring further screening, evaluation, or treatment in the ED at this time prior to discharge.   Final Clinical Impression(s) / ED Diagnoses Final diagnoses:  Pain in both lower extremities  Rx / DC Orders ED Discharge Orders         Ordered    diclofenac Sodium (VOLTAREN) 1 % GEL  4 times daily     06/17/19 0344           Melene Plan, DO 06/17/19 [phone]

## 2019-06-17 NOTE — Discharge Instructions (Signed)
Betty Henderson works in the community to try and help folks that are homeless have medical care.  Try and follow-up with her if you can.  There is also a hotline number that you can call in the social workers to try and set you up with a physician that can see you in the office.  Try the ointment as prescribed and see if it helps you for your pain.

## 2019-06-17 NOTE — ED Triage Notes (Signed)
[place] [facility] [place].  Pt stated to EMS that she walked [place] [employer] [facility] [place].  Also c/o generalized pain.

## 2019-06-25 ENCOUNTER — Other Ambulatory Visit: Payer: Self-pay

## 2019-06-25 ENCOUNTER — Emergency Department (HOSPITAL_BASED_OUTPATIENT_CLINIC_OR_DEPARTMENT_OTHER)
Admission: EM | Admit: 2019-06-25 | Discharge: 2019-06-25 | Disposition: A | Discharge status: Home / Self Care | Payer: Self-pay | Attending: Emergency Medicine | Admitting: Emergency Medicine

## 2019-06-25 ENCOUNTER — Encounter (HOSPITAL_BASED_OUTPATIENT_CLINIC_OR_DEPARTMENT_OTHER): Payer: Self-pay | Admitting: Emergency Medicine

## 2019-06-25 DIAGNOSIS — Z5900 Homelessness unspecified: Secondary | ICD-10-CM

## 2019-06-25 DIAGNOSIS — M7918 Myalgia, other site: Secondary | ICD-10-CM | POA: Insufficient documentation

## 2019-06-25 DIAGNOSIS — F17210 Nicotine dependence, cigarettes, uncomplicated: Secondary | ICD-10-CM | POA: Insufficient documentation

## 2019-06-25 DIAGNOSIS — G8929 Other chronic pain: Secondary | ICD-10-CM | POA: Insufficient documentation

## 2019-06-25 DIAGNOSIS — Z79899 Other long term (current) drug therapy: Secondary | ICD-10-CM | POA: Insufficient documentation

## 2019-06-25 MED ORDER — NAPROXEN 250 MG PO TABS
500.0000 mg | ORAL_TABLET | Freq: Once | ORAL | Status: AC
  Administered 2019-06-25: 500 mg via ORAL
  Filled 2019-06-25: qty 2

## 2019-06-25 NOTE — ED Provider Notes (Addendum)
[place] DEPT [facility] Provider Note: Georgena Spurling, MD, FACEP  CSN: [id number] MRN: [id number] ARRIVAL: 06/25/19 at [place]: MH04/MH04   CHIEF COMPLAINT  Neck Pain   HISTORY OF PRESENT ILLNESS  06/25/19 5:40 AM Betty Henderson is a 59 y.o. female schizophrenic homeless person with a history of frequent visits (30 in the past 6 months) to local emergency departments, usually for complaints of chronic pain.  She is here complaining of neck pain and bilateral lower extremity pain, primarily in the thighs, which she attributes to excessive walking.  She rates her pain is a 10 out of 10 and describes the pain is aching and soreness.  Pain is improved with rest and worse with ambulation.  She has had no new trauma.   Past Medical History:  Diagnosis Date  . Anemia   . Arthritis   . Chronic pain   . Drug-seeking behavior   . Malingering   . Osteopetrosis   . Psychosis ([place])   . Schizoaffective disorder, bipolar type [facility])     Past Surgical History:  Procedure Laterality Date  . MOUTH SURGERY    . TUBAL LIGATION      Family History  Family history unknown: Yes    Social History   Tobacco Use  . Smoking status: Current Some Day Smoker    Packs/day: 1.00    Types: Cigarettes  . Smokeless tobacco: Never Used  Substance Use Topics  . Alcohol use: Yes  . Drug use: Yes    Types: Cocaine    Comment: states "it's legal though"    Prior to Admission medications   Medication Sig Start Date End Date Taking? Authorizing Provider  diclofenac Sodium (VOLTAREN) 1 % GEL Apply 4 g topically 4 (four) times daily. 06/17/19   Deno Etienne, DO  ranitidine (ZANTAC) 150 MG capsule Take 1 capsule (150 mg total) by mouth daily. Patient not taking: Reported on 03/20/2018 05/31/17 01/07/19  Lacretia Leigh, MD    Allergies Lemon oil, Strawberry extract, Other, Adhesive  [tape], Cherry, and Proanthocyanidin   REVIEW OF SYSTEMS  Negative except as noted here or in the History of Present  Illness.   PHYSICAL EXAMINATION  Initial Vital Signs Blood pressure (!) 120/94, pulse 60, temperature 98.2 F (36.8 C), temperature source Oral, resp. rate 18, height 5\' 6"  (1.676 m), weight 72.6 kg, SpO2 99 %.  Examination General: Well-developed, well-nourished female in no acute distress; appearance consistent with age of record HENT: normocephalic; atraumatic Eyes: Normal appearance Neck: supple Heart: regular rate and rhythm Lungs: clear to auscultation bilaterally Abdomen: soft; nondistended; nontender; bowel sounds present Extremities: No deformity; pulses normal; no edema Neurologic: Awake, alert; motor function intact in all extremities and symmetric; no facial droop Skin: Warm and dry Psychiatric: Flat affect   RESULTS  Summary of this visit's results, reviewed and interpreted by myself:   EKG Interpretation  Date/Time:    Ventricular Rate:    PR Interval:    QRS Duration:   QT Interval:    QTC Calculation:   R Axis:     Text Interpretation:        Laboratory Studies: No results found for this or any previous visit (from the past 24 hour(s)). Imaging Studies: No results found.  ED COURSE and MDM  Nursing notes, initial and subsequent vitals signs, including pulse oximetry, reviewed and interpreted by myself.  Vitals:   06/25/19 0535 06/25/19 0536  BP: (!) 120/94   Pulse: 60   Resp: 18  Temp: 98.2 F (36.8 C)   TempSrc: Oral   SpO2: 99%   Weight:  72.6 kg  Height:  5\' 6"  (1.676 m)   Medications  naproxen (NAPROSYN) tablet 500 mg (500 mg Oral Given 06/25/19 0545)   The patient appears to have no new complaints.  On her previous visit she was given Voltaren gel to use topically on her legs.  She was given naproxen 500 mg on this visit.  Her principal interest appears to be wanting a warm place to sleep as it is cold (29 degrees) outside this morning.  PROCEDURES  Procedures   ED DIAGNOSES     ICD-10-CM   1. Homelessness  Z59.0   2. Chronic  musculoskeletal pain  M79.18    G89.29        China Deitrick, 08/25/19, MD 06/25/19 0551    08/25/19, MD 06/25/19 [phone]

## 2019-06-25 NOTE — ED Triage Notes (Signed)
Patient presents via EMS with complaints of neck pain and bilateral lower extremity pain from walking an extended period of time. Ambulatory to stretcher with no assistance.

## 2019-07-02 ENCOUNTER — Other Ambulatory Visit: Payer: Self-pay

## 2019-07-02 ENCOUNTER — Encounter (HOSPITAL_COMMUNITY): Payer: Self-pay | Admitting: Emergency Medicine

## 2019-07-02 ENCOUNTER — Emergency Department (HOSPITAL_COMMUNITY)
Admission: EM | Admit: 2019-07-02 | Discharge: 2019-07-03 | Disposition: A | Discharge status: Home / Self Care | Payer: Self-pay | Attending: Emergency Medicine | Admitting: Emergency Medicine

## 2019-07-02 DIAGNOSIS — F17210 Nicotine dependence, cigarettes, uncomplicated: Secondary | ICD-10-CM | POA: Insufficient documentation

## 2019-07-02 DIAGNOSIS — M542 Cervicalgia: Secondary | ICD-10-CM | POA: Insufficient documentation

## 2019-07-02 DIAGNOSIS — G8929 Other chronic pain: Secondary | ICD-10-CM | POA: Insufficient documentation

## 2019-07-02 NOTE — ED Triage Notes (Signed)
Pt reports having a fall two weeks ago, scraped up right knee. Reports now that her head and neck hurts. States yesterday that she was examined at [facility] and was told that "my neck is broken." Pt not in a collar, moving head without difficulties. Reports feels like she has a pinched nerve in her neck sometimes. Denies pain at this time. Pt is homeless.

## 2019-07-03 MED ORDER — ACETAMINOPHEN 500 MG PO TABS
1000.0000 mg | ORAL_TABLET | Freq: Once | ORAL | Status: AC
  Administered 2019-07-03: 1000 mg via ORAL
  Filled 2019-07-03: qty 2

## 2019-07-03 NOTE — ED Provider Notes (Signed)
[place] EMERGENCY DEPARTMENT Provider Note   CSN: [id number] Arrival date & time: 07/02/19  [place]     History Chief Complaint  Patient presents with  . Neck Pain  . Fall    Betty Henderson is a 59 y.o. female.  59 year old female with a history of chronic pain, drug-seeking behavior, malingering, schizoaffective disorder, homelessness presents to the emergency department with multiple chronic complaints.  Primarily complaining of pain to the left side of her neck.  She reports that she has a "pinched nerve".  She has had similar pain for quite some time.  Denies taking any medications for her symptoms.  Alleges in triage that her "neck is broken", but with no difficulty or pain with ROM.  Denies trouble with ambulation.  Goes on to complain of persistent dandruff of her scalp.  Patient has been evaluated in this department 31 times in the past 6 months; seen x2 at [facility] on [facility]/26/19.  The history is provided by the patient. No language interpreter was used.  Neck Pain Fall       Past Medical History:  Diagnosis Date  . Anemia   . Arthritis   . Chronic pain   . Drug-seeking behavior   . Malingering   . Osteopetrosis   . Psychosis ([place])   . Schizoaffective disorder, bipolar type ([place])     There are no problems to display for this patient.   Past Surgical History:  Procedure Laterality Date  . MOUTH SURGERY    . TUBAL LIGATION       OB History   No obstetric history on file.     Family History  Family history unknown: Yes    Social History   Tobacco Use  . Smoking status: Current Some Day Smoker    Packs/day: 1.00    Types: Cigarettes  . Smokeless tobacco: Never Used  Substance Use Topics  . Alcohol use: Yes  . Drug use: Yes    Types: Cocaine    Comment: states "it's legal though"    Home Medications Prior to Admission medications   Medication Sig Start Date End Date Taking? Authorizing Provider  diclofenac Sodium (VOLTAREN) 1 %  GEL Apply 4 g topically 4 (four) times daily. 06/17/19   Deno Etienne, DO  ranitidine (ZANTAC) 150 MG capsule Take 1 capsule (150 mg total) by mouth daily. Patient not taking: Reported on 03/20/2018 05/31/17 01/07/19  Lacretia Leigh, MD    Allergies    Lemon oil, Strawberry extract, Other, Adhesive  [tape], Cherry, and Proanthocyanidin  Review of Systems   Review of Systems  Musculoskeletal: Positive for neck pain.  Ten systems reviewed and are negative for acute change, except as noted in the HPI.    Physical Exam Updated Vital Signs BP 121/70 (BP Location: Right Arm)   Pulse 71   Temp 97.8 F (36.6 C) (Oral)   Resp 16   Ht 5\' 6"  (1.676 m)   Wt 72.6 kg   SpO2 99%   BMI 25.82 kg/m   Physical Exam Vitals and nursing note reviewed.  Constitutional:      General: She is not in acute distress.    Appearance: She is well-developed. She is not diaphoretic.     Comments: Nontoxic and in NAD  HENT:     Head: Normocephalic and atraumatic.  Eyes:     General: No scleral icterus.    Conjunctiva/sclera: Conjunctivae normal.  Neck:     Comments: Full ROM of neck without  meningismus  Pulmonary:     Effort: Pulmonary effort is normal. No respiratory distress.     Comments: Respirations even and unlabored Musculoskeletal:        General: Normal range of motion.     Cervical back: Normal range of motion and neck supple. No rigidity.  Skin:    General: Skin is warm and dry.     Coloration: Skin is not pale.     Findings: No erythema or rash.  Neurological:     Mental Status: She is alert and oriented to person, place, and time.     Coordination: Coordination normal.     Comments: GCS 15.  Ambulatory independently with steady gait.  Moving all extremities spontaneously.  Psychiatric:        Behavior: Behavior normal.     ED Results / Procedures / Treatments   Labs (all labs ordered are listed, but only abnormal results are displayed) Labs Reviewed - No data to  display  EKG None  Radiology No results found.  Procedures Procedures (including critical care time)  Medications Ordered in ED Medications  acetaminophen (TYLENOL) tablet 1,000 mg (has no administration in time range)    ED Course  I have reviewed the triage vital signs and the nursing notes.  Pertinent labs & imaging results that were available during my care of the patient were reviewed by me and considered in my medical decision making (see chart for details).    MDM Rules/Calculators/A&P                      59 year old female presents to the emergency department for evaluation of multiple chronic complaints.  Complains of neck pain, but with no meningismus or pain on range of motion.  No evidence of trauma or injury to neck.  She is ambulatory independently and moving all extremities spontaneously.  Tylenol given prior to discharge.  Do not feel further emergent evaluation is indicated.  Return precautions discussed and provided. Patient discharged in stable condition with no unaddressed concerns.   Final Clinical Impression(s) / ED Diagnoses Final diagnoses:  Chronic neck pain    Rx / DC Orders ED Discharge Orders    None       Antony Madura, PA-C 07/03/19 0111    Glynn Octave, MD 07/03/19 [phone]

## 2019-07-03 NOTE — ED Notes (Signed)
Pt verbalized understanding of d/c instructions and s/s requiring return to ed. Pt transported via wheelchair to exit.

## 2019-07-05 ENCOUNTER — Other Ambulatory Visit: Payer: Self-pay

## 2019-07-05 ENCOUNTER — Emergency Department (HOSPITAL_COMMUNITY): Payer: Self-pay

## 2019-07-05 ENCOUNTER — Emergency Department (HOSPITAL_COMMUNITY)
Admission: EM | Admit: 2019-07-05 | Discharge: 2019-07-05 | Disposition: A | Discharge status: Home / Self Care | Payer: Self-pay | Attending: Emergency Medicine | Admitting: Emergency Medicine

## 2019-07-05 ENCOUNTER — Encounter: Payer: Self-pay | Admitting: *Deleted

## 2019-07-05 DIAGNOSIS — Z5321 Procedure and treatment not carried out due to patient leaving prior to being seen by health care provider: Secondary | ICD-10-CM | POA: Insufficient documentation

## 2019-07-05 DIAGNOSIS — R52 Pain, unspecified: Secondary | ICD-10-CM | POA: Insufficient documentation

## 2019-07-05 NOTE — ED Triage Notes (Signed)
Patient here found wandering around [facility]. Reports testing positive for covid 2 weeks ago and is still having body aches and chills with cough. Denies chest pain.

## 2019-07-05 NOTE — Congregational Nurse Program (Signed)
  Dept: [phone]   Congregational Nurse Program Note  Date of Encounter: 07/05/2019  Past Medical History: Past Medical History:  Diagnosis Date  . Anemia   . Arthritis   . Chronic pain   . Drug-seeking behavior   . Malingering   . Osteopetrosis   . Psychosis (HCC)   . Schizoaffective disorder, bipolar type [facility])     Encounter Details: CNP Questionnaire - 07/05/19 1332      Questionnaire   Patient Status  Not Applicable    Race  Black or African American    Location Patient Served At  [employer]  Not Applicable    Uninsured  Uninsured (NEW 1x/quarter)    Food  Yes, have food insecurities    Housing/Utilities  Yes, have permanent housing    Transportation  Yes, need transportation assistance    Interpersonal Safety  Yes, feel physically and emotionally safe where you currently live    Medication  No medication insecurities    Medical Provider  No    Referrals  Primary Care Provider/Clinic    ED Visit Averted  Not Applicable    Life-Saving Intervention Made  Not Applicable      Client came to [facility] requesting to see NP for medical services. No specific medical problem complaining of. Assisted client in completing intake form to see NP. Talked to pt about help with transportation, food insecurities and mental health services. Pt did not follow up on previous mental health referrals. Pt says she plans to go to [place] to her family home. Pt's speech is tangential and she is difficult to understand. Pt left directly after seeing NP and did not see Clinical research associate about bus passes.

## 2019-07-06 ENCOUNTER — Other Ambulatory Visit: Payer: Self-pay

## 2019-07-06 ENCOUNTER — Encounter (HOSPITAL_COMMUNITY): Payer: Self-pay | Admitting: *Deleted

## 2019-07-06 ENCOUNTER — Emergency Department (HOSPITAL_COMMUNITY)
Admission: EM | Admit: 2019-07-06 | Discharge: 2019-07-07 | Disposition: A | Discharge status: Home / Self Care | Payer: Self-pay | Attending: Emergency Medicine | Admitting: Emergency Medicine

## 2019-07-06 DIAGNOSIS — M79672 Pain in left foot: Secondary | ICD-10-CM | POA: Insufficient documentation

## 2019-07-06 DIAGNOSIS — M79671 Pain in right foot: Secondary | ICD-10-CM | POA: Insufficient documentation

## 2019-07-06 DIAGNOSIS — Y9301 Activity, walking, marching and hiking: Secondary | ICD-10-CM | POA: Insufficient documentation

## 2019-07-06 DIAGNOSIS — Z79899 Other long term (current) drug therapy: Secondary | ICD-10-CM | POA: Insufficient documentation

## 2019-07-06 DIAGNOSIS — Z590 Homelessness: Secondary | ICD-10-CM | POA: Insufficient documentation

## 2019-07-06 DIAGNOSIS — Y999 Unspecified external cause status: Secondary | ICD-10-CM | POA: Insufficient documentation

## 2019-07-06 DIAGNOSIS — X509XXA Other and unspecified overexertion or strenuous movements or postures, initial encounter: Secondary | ICD-10-CM | POA: Insufficient documentation

## 2019-07-06 DIAGNOSIS — Y929 Unspecified place or not applicable: Secondary | ICD-10-CM | POA: Insufficient documentation

## 2019-07-06 DIAGNOSIS — F17210 Nicotine dependence, cigarettes, uncomplicated: Secondary | ICD-10-CM | POA: Insufficient documentation

## 2019-07-06 DIAGNOSIS — M542 Cervicalgia: Secondary | ICD-10-CM | POA: Insufficient documentation

## 2019-07-06 NOTE — Discharge Instructions (Addendum)
Follow up with primary doctor as needed

## 2019-07-06 NOTE — ED Triage Notes (Signed)
Pt arrives with c/o bilateral feet pain and neck pain, says she has been walking all night and day and they hurt.

## 2019-07-06 NOTE — ED Notes (Signed)
Pt ambulated to BR with no assist, steady gait.

## 2019-07-06 NOTE — ED Provider Notes (Signed)
[place] DEPT Provider Note   CSN: [id number] Arrival date & time: 07/06/19  1932     History Chief Complaint  Patient presents with  . Foot Pain    Betty Henderson is a 59 y.o. female.  Patient is a 59 year old female with history of schizoaffective, malingering, and chronic pain.  She presents here with foot pain.  States she has been walking for prolonged period of time and this is caused her feet to hurt.  She denies to me she has had any specific injury or trauma.  The history is provided by the patient.  Foot Pain This is a new problem. The problem occurs constantly. The problem has been gradually worsening. The symptoms are aggravated by walking. Nothing relieves the symptoms. She has tried nothing for the symptoms.       Past Medical History:  Diagnosis Date  . Anemia   . Arthritis   . Chronic pain   . Drug-seeking behavior   . Malingering   . Osteopetrosis   . Psychosis ([place])   . Schizoaffective disorder, bipolar type ([place])     There are no problems to display for this patient.   Past Surgical History:  Procedure Laterality Date  . MOUTH SURGERY    . TUBAL LIGATION       OB History   No obstetric history on file.     Family History  Family history unknown: Yes    Social History   Tobacco Use  . Smoking status: Current Some Day Smoker    Packs/day: 1.00    Types: Cigarettes  . Smokeless tobacco: Never Used  Substance Use Topics  . Alcohol use: Yes  . Drug use: Yes    Types: Cocaine    Comment: states "it's legal though"    Home Medications Prior to Admission medications   Medication Sig Start Date End Date Taking? Authorizing Provider  diclofenac Sodium (VOLTAREN) 1 % GEL Apply 4 g topically 4 (four) times daily. 06/17/19   Deno Etienne, DO  ranitidine (ZANTAC) 150 MG capsule Take 1 capsule (150 mg total) by mouth daily. Patient not taking: Reported on 03/20/2018 05/31/17 01/07/19  Lacretia Leigh, MD     Allergies    Lemon oil, Strawberry extract, Other, Adhesive  [tape], Cherry, and Proanthocyanidin  Review of Systems   Review of Systems  All other systems reviewed and are negative.   Physical Exam Updated Vital Signs BP 134/76 (BP Location: Left Arm)   Pulse 74   Temp 97.6 F (36.4 C) (Oral)   Resp 17   SpO2 100%   Physical Exam Vitals and nursing note reviewed.  Constitutional:      Appearance: Normal appearance.  HENT:     Head: Normocephalic and atraumatic.  Pulmonary:     Effort: Pulmonary effort is normal.  Musculoskeletal:     Comments: Patient's feet appear grossly normal.  There is no obvious abnormality or deformity.  Skin:    General: Skin is warm and dry.  Neurological:     Mental Status: She is alert.     ED Results / Procedures / Treatments   Labs (all labs ordered are listed, but only abnormal results are displayed) Labs Reviewed - No data to display  EKG None  Radiology DG Chest 2 View  Result Date: 07/05/2019 CLINICAL DATA:  COVID-19 positive, weakness, short of breath EXAM: CHEST - 2 VIEW COMPARISON:  04/28/2019 FINDINGS: The heart size and mediastinal contours are within normal limits. Both  lungs are clear. The visualized skeletal structures are unremarkable. IMPRESSION: No active cardiopulmonary disease. Electronically Signed   By: Sharlet Salina M.D.   On: 07/05/2019 21:31    Procedures Procedures (including critical care time)  Medications Ordered in ED Medications - No data to display  ED Course  I have reviewed the triage vital signs and the nursing notes.  Pertinent labs & imaging results that were available during my care of the patient were reviewed by me and considered in my medical decision making (see chart for details).    MDM Rules/Calculators/A&P  Patient brought here for evaluation of foot pain and neck pain.  This occurred from walking around.  Patient is homeless and well-known to the emergency department.  I see  no indication for any imaging studies and feel as though patient is appropriate for discharge.  Final Clinical Impression(s) / ED Diagnoses Final diagnoses:  None    Rx / DC Orders ED Discharge Orders    None       Geoffery Lyons, MD 07/06/19 2142

## 2019-09-03 ENCOUNTER — Encounter (HOSPITAL_BASED_OUTPATIENT_CLINIC_OR_DEPARTMENT_OTHER): Payer: Self-pay | Admitting: Emergency Medicine

## 2019-09-03 ENCOUNTER — Other Ambulatory Visit: Payer: Self-pay

## 2019-09-03 ENCOUNTER — Emergency Department (HOSPITAL_BASED_OUTPATIENT_CLINIC_OR_DEPARTMENT_OTHER)
Admission: EM | Admit: 2019-09-03 | Discharge: 2019-09-03 | Disposition: A | Discharge status: Home / Self Care | Payer: Self-pay | Attending: Emergency Medicine | Admitting: Emergency Medicine

## 2019-09-03 DIAGNOSIS — F99 Mental disorder, not otherwise specified: Secondary | ICD-10-CM | POA: Insufficient documentation

## 2019-09-03 DIAGNOSIS — M79672 Pain in left foot: Secondary | ICD-10-CM | POA: Insufficient documentation

## 2019-09-03 DIAGNOSIS — M79671 Pain in right foot: Secondary | ICD-10-CM | POA: Insufficient documentation

## 2019-09-03 DIAGNOSIS — F17210 Nicotine dependence, cigarettes, uncomplicated: Secondary | ICD-10-CM | POA: Insufficient documentation

## 2019-09-03 NOTE — ED Provider Notes (Signed)
TIME SEEN: 3:23 AM  CHIEF COMPLAINT: Multiple complaints  HPI: Patient is a 59 year old female with history of schizoaffective disorder, homelessness, drug-seeking behavior, malingering who presents to the emergency department with multiple complaints.  Initially told nursing staff that she was having bilateral foot pain without known injury.  Also told nursing staff that she was concerned she could have measles again that she has had measles several months ago.  Told nursing staff that she thought she had a fever tonight.  When I evaluate the patient, she states she is here because she is just sore all over.  She denies any other additional complaints to me at this time.  She is unable to tell me if she is homeless currently.  She states that she has a "family home" that she can stay at.  She denies SI, HI.  Unable to answer if she is having any active hallucinations.  ROS: Level 5 caveat due to patient's chronic psychiatric illness  PAST MEDICAL HISTORY/PAST SURGICAL HISTORY:  Past Medical History:  Diagnosis Date  . Anemia   . Arthritis   . Chronic pain   . Drug-seeking behavior   . Malingering   . Osteopetrosis   . Psychosis (HCC)   . Schizoaffective disorder, bipolar type (HCC)     MEDICATIONS:  Prior to Admission medications   Medication Sig Start Date End Date Taking? Authorizing Provider  diclofenac Sodium (VOLTAREN) 1 % GEL Apply 4 g topically 4 (four) times daily. 06/17/19   Melene Plan, DO  ranitidine (ZANTAC) 150 MG capsule Take 1 capsule (150 mg total) by mouth daily. Patient not taking: Reported on 03/20/2018 05/31/17 01/07/19  Lorre Nick, MD    ALLERGIES:  Allergies  Allergen Reactions  . Lemon Oil Rash  . Strawberry Extract Rash  . Other     PT reports having environmental allergies which she used to receive shots for, but cannot specify exact allergens  . Adhesive  [Tape] Rash  . Cherry Rash  . Proanthocyanidin Rash    SOCIAL HISTORY:  Social History    Tobacco Use  . Smoking status: Current Some Day Smoker    Packs/day: 1.00    Types: Cigarettes  . Smokeless tobacco: Never Used  Substance Use Topics  . Alcohol use: Yes    FAMILY HISTORY: Family History  Family history unknown: Yes    EXAM: BP 137/78 (BP Location: Right Arm)   Pulse 94   Temp 98.1 F (36.7 C) (Oral)   Resp 18   Ht 5\' 6"  (1.676 m)   Wt 68 kg   SpO2 100%   BMI 24.20 kg/m  CONSTITUTIONAL: Alert and oriented and responds appropriately to questions.  Chronically ill-appearing, afebrile, nontoxic HEAD: Normocephalic EYES: Conjunctivae clear, pupils appear equal, EOM appear intact ENT: normal nose; moist mucous membranes NECK: Supple, normal ROM CARD: RRR; S1 and S2 appreciated; no murmurs, no clicks, no rubs, no gallops RESP: Normal chest excursion without splinting or tachypnea; breath sounds clear and equal bilaterally; no wheezes, no rhonchi, no rales, no hypoxia or respiratory distress, speaking full sentences ABD/GI: Normal bowel sounds; non-distended; soft, non-tender, no rebound, no guarding, no peritoneal signs, no hepatosplenomegaly BACK:  The back appears normal EXT: Normal ROM in all joints; no deformity noted, no edema; no cyanosis, no tenderness over either foot to palpation, 2+ DP pulses bilaterally, no calf tenderness or calf swelling, compartments are soft, no joint effusion, no redness or warmth SKIN: Normal color for age and race; warm; no rash on exposed  skin, no rash on her palms or soles, no urticaria, no petechia or purpura, no blisters or desquamation NEURO: Moves all extremities equally, normal speech, normal gait PSYCH: Patient has an odd affect.  She denies SI, HI.  Does not appear to be responding to internal stimuli.  MEDICAL DECISION MAKING: Patient here with multiple different complaints that seem to change intermittently.  I suspect she is here due to homelessness, malingering.  She also has psychiatric illness that I suspect is  not currently being treated.  I do not feel however that she has a psychiatric emergency where she needs to see TTS for possible placement.  I recommended Tylenol for discomfort over-the-counter.  Have recommended PCP follow-up.  I do not feel at this time she needs further emergent work-up.  She is afebrile here without other infectious symptoms and does not appear toxic, septic.  Her feet appear normal without signs of gout, fracture, cellulitis, abscess, compartment syndrome, septic arthritis.  At this time, I do not feel there is any life-threatening condition present. I have reviewed, interpreted and discussed all results (EKG, imaging, lab, urine as appropriate) and exam findings with patient/family. I have reviewed nursing notes and appropriate previous records.  I feel the patient is safe to be discharged home without further emergent workup and can continue workup as an outpatient as needed. Discussed usual and customary return precautions. Patient/family verbalize understanding and are comfortable with this plan.  Outpatient follow-up has been provided as needed. All questions have been answered.    Betty Henderson was evaluated in Emergency Department on 09/03/2019 for the symptoms described in the history of present illness. She was evaluated in the context of the global COVID-19 pandemic, which necessitated consideration that the patient might be at risk for infection with the SARS-CoV-2 virus that causes COVID-19. Institutional protocols and algorithms that pertain to the evaluation of patients at risk for COVID-19 are in a state of rapid change based on information released by regulatory bodies including the CDC and federal and state organizations. These policies and algorithms were followed during the patient's care in the ED.      Reese Stockman, Delice Bison, DO 09/03/19 [phone]

## 2019-09-03 NOTE — Discharge Instructions (Addendum)
You may take Tylenol 1000 mg every 6 hours as needed for pain.  This medication is found over-the-counter.  Please follow-up with your primary care physician.

## 2019-09-03 NOTE — ED Triage Notes (Addendum)
  Patient comes in and states she has had a fever for a few days but has not been able to check it.  Patient states she was seen at the [place] several months ago and told she had measles.  Patient also complaining of pain in her bilateral feet.  Patient states she has had "a few swallows" of alcohol tonight.

## 2019-09-03 NOTE — ED Notes (Signed)
°  Patient upset she was not given shot for her pain.  Patient given discharge instructions and left willingly.  Patient escorted to discharge area.

## 2020-01-02 ENCOUNTER — Emergency Department (HOSPITAL_BASED_OUTPATIENT_CLINIC_OR_DEPARTMENT_OTHER)
Admission: EM | Admit: 2020-01-02 | Discharge: 2020-01-02 | Disposition: A | Discharge status: Home / Self Care | Payer: Self-pay | Attending: Emergency Medicine | Admitting: Emergency Medicine

## 2020-01-02 ENCOUNTER — Other Ambulatory Visit: Payer: Self-pay

## 2020-01-02 ENCOUNTER — Encounter (HOSPITAL_BASED_OUTPATIENT_CLINIC_OR_DEPARTMENT_OTHER): Payer: Self-pay

## 2020-01-02 DIAGNOSIS — M542 Cervicalgia: Secondary | ICD-10-CM | POA: Insufficient documentation

## 2020-01-02 DIAGNOSIS — F17210 Nicotine dependence, cigarettes, uncomplicated: Secondary | ICD-10-CM | POA: Insufficient documentation

## 2020-01-02 MED ORDER — LIDOCAINE 5 % EX PTCH
1.0000 | MEDICATED_PATCH | CUTANEOUS | Status: DC
  Administered 2020-01-02: 1 via TRANSDERMAL
  Filled 2020-01-02: qty 1

## 2020-01-02 MED ORDER — ACETAMINOPHEN 500 MG PO TABS
1000.0000 mg | ORAL_TABLET | Freq: Once | ORAL | Status: AC
  Administered 2020-01-02: 1000 mg via ORAL
  Filled 2020-01-02: qty 2

## 2020-01-02 MED ORDER — LIDOCAINE 5 % EX PTCH: 1.0000 | MEDICATED_PATCH | CUTANEOUS | 0 refills | Status: DC

## 2020-01-02 MED ORDER — IBUPROFEN 800 MG PO TABS
800.0000 mg | ORAL_TABLET | Freq: Once | ORAL | Status: AC
  Administered 2020-01-02: 800 mg via ORAL
  Filled 2020-01-02: qty 1

## 2020-01-02 NOTE — ED Notes (Signed)
Patient refused to sign DC instructions and was mad she was being discharged.

## 2020-01-02 NOTE — ED Provider Notes (Signed)
[facility] EMERGENCY DEPARTMENT Provider Note   CSN: [id number] Arrival date & time: 01/02/20  1194     History Chief Complaint  Patient presents with  . Neck Pain    Betty Henderson is a 59 y.o. female.  The history is provided by the patient.  Neck Pain Pain location:  Generalized neck Quality:  Aching Pain radiates to:  Does not radiate Pain severity:  Moderate Pain is:  Same all the time Onset quality:  Gradual Timing:  Constant Progression:  Unchanged Chronicity:  Recurrent Context: not fall, not jumping from heights and not lifting a heavy object   Relieved by:  Nothing Worsened by:  Nothing Ineffective treatments:  None tried Associated symptoms: no bladder incontinence, no bowel incontinence, no chest pain, no fever, no headaches, no leg pain, no numbness, no paresis, no photophobia, no tingling, no visual change, no weakness and no weight loss   Risk factors: no hx of head and neck radiation        Past Medical History:  Diagnosis Date  . Anemia   . Arthritis   . Chronic pain   . Drug-seeking behavior   . Malingering   . Osteopetrosis   . Psychosis (HCC)   . Schizoaffective disorder, bipolar type (HCC)     There are no problems to display for this patient.   Past Surgical History:  Procedure Laterality Date  . MOUTH SURGERY    . TUBAL LIGATION       OB History   No obstetric history on file.     Family History  Family history unknown: Yes    Social History   Tobacco Use  . Smoking status: Current Some Day Smoker    Packs/day: 1.00    Types: Cigarettes  . Smokeless tobacco: Never Used  Vaping Use  . Vaping Use: Never used  Substance Use Topics  . Alcohol use: Yes  . Drug use: Yes    Types: Cocaine    Comment: states "it's legal though"    Home Medications Prior to Admission medications   Medication Sig Start Date End Date Taking? Authorizing Provider  diclofenac Sodium (VOLTAREN) 1 % GEL Apply 4 g topically 4  (four) times daily. 06/17/19   Melene Plan, DO  ranitidine (ZANTAC) 150 MG capsule Take 1 capsule (150 mg total) by mouth daily. Patient not taking: Reported on 03/20/2018 05/31/17 01/07/19  Lorre Nick, MD    Allergies    Lemon oil, Strawberry extract, Other, Adhesive  [tape], Cherry, and Proanthocyanidin  Review of Systems   Review of Systems  Constitutional: Negative for fever and weight loss.  HENT: Negative for congestion.   Eyes: Negative for photophobia.  Respiratory: Negative for shortness of breath.   Cardiovascular: Negative for chest pain.  Gastrointestinal: Negative for abdominal pain and bowel incontinence.  Endocrine: Negative for polyuria.  Genitourinary: Negative for bladder incontinence and difficulty urinating.  Musculoskeletal: Positive for neck pain. Negative for neck stiffness.  Skin: Negative for rash.  Neurological: Negative for tingling, weakness, numbness and headaches.  Psychiatric/Behavioral: Negative for agitation.  All other systems reviewed and are negative.   Physical Exam Updated Vital Signs BP 118/73 (BP Location: Right Arm)   Pulse 73   Temp 98.1 F (36.7 C) (Oral)   Resp 16   SpO2 100%   Physical Exam Vitals and nursing note reviewed.  Constitutional:      General: She is not in acute distress.    Appearance: Normal appearance.  HENT:  Head: Normocephalic and atraumatic.     Nose: Nose normal.  Eyes:     Conjunctiva/sclera: Conjunctivae normal.     Pupils: Pupils are equal, round, and reactive to light.  Neck:     Vascular: No carotid bruit.  Cardiovascular:     Rate and Rhythm: Normal rate and regular rhythm.     Pulses: Normal pulses.     Heart sounds: Normal heart sounds.  Pulmonary:     Effort: Pulmonary effort is normal.     Breath sounds: Normal breath sounds.  Abdominal:     General: Abdomen is flat. Bowel sounds are normal.     Palpations: Abdomen is soft.     Tenderness: There is no abdominal tenderness. There is  no guarding.  Musculoskeletal:        General: Normal range of motion.     Cervical back: Normal range of motion and neck supple. No rigidity or tenderness.  Lymphadenopathy:     Cervical: No cervical adenopathy.  Skin:    General: Skin is warm and dry.     Capillary Refill: Capillary refill takes less than 2 seconds.  Neurological:     General: No focal deficit present.     Mental Status: She is alert and oriented to person, place, and time.     Deep Tendon Reflexes: Reflexes normal.  Psychiatric:        Mood and Affect: Mood normal.        Behavior: Behavior normal.     ED Results / Procedures / Treatments   Labs (all labs ordered are listed, but only abnormal results are displayed) Labs Reviewed - No data to display  EKG None  Radiology No results found.  Procedures Procedures (including critical care time)  Medications Ordered in ED Medications  acetaminophen (TYLENOL) tablet 1,000 mg (has no administration in time range)  ibuprofen (ADVIL) tablet 800 mg (has no administration in time range)  lidocaine (LIDODERM) 5 % 1 patch (has no administration in time range)    ED Course  I have reviewed the triage vital signs and the nursing notes.  Pertinent labs & imaging results that were available during my care of the patient were reviewed by me and considered in my medical decision making (see chart for details).    Patient with chronic pain presents with neck pain.  Exam is benign and reassuring.  No stiffness, no symptoms of meningitis. No trauma.  No indication for imaging at this time.   Treated in the ED.    Barba Solt was evaluated in Emergency Department on 01/02/2020 for the symptoms described in the history of present illness. She was evaluated in the context of the global COVID-19 pandemic, which necessitated consideration that the patient might be at risk for infection with the SARS-CoV-2 virus that causes COVID-19. Institutional protocols and  algorithms that pertain to the evaluation of patients at risk for COVID-19 are in a state of rapid change based on information released by regulatory bodies including the CDC and federal and state organizations. These policies and algorithms were followed during the patient's care in the ED.  Final Clinical Impression(s) / ED Diagnoses Return for intractable cough, coughing up blood,fevers >100.4 unrelieved by medication, shortness of breath, intractable vomiting, chest pain, shortness of breath, weakness,numbness, changes in speech, facial asymmetry,abdominal pain, passing out,Inability to tolerate liquids or food, cough, altered mental status or any concerns. No signs of systemic illness or infection. The patient is nontoxic-appearing on exam and vital signs  are within normal limits.   I have reviewed the triage vital signs and the nursing notes. Pertinent labs &imaging results that were available during my care of the patient were reviewed by me and considered in my medical decision making (see chart for details).After history, exam, and medical workup I feel the patient has beenappropriately medically screened and is safe for discharge home. Pertinent diagnoses were discussed with the patient. Patient was given return precautions.   Rylon Poitra, MD 01/02/20 [phone]

## 2020-01-02 NOTE — ED Triage Notes (Signed)
Per EMS called out to [facility] for pt. Patient states she gets neck pain when she walks to much. Patient states she has been walking a lot the past couple of days causing her neck pain. Patient denies SOB,CP,N/V

## 2020-01-08 ENCOUNTER — Other Ambulatory Visit: Payer: Self-pay

## 2020-01-08 ENCOUNTER — Emergency Department (HOSPITAL_BASED_OUTPATIENT_CLINIC_OR_DEPARTMENT_OTHER)
Admission: EM | Admit: 2020-01-08 | Discharge: 2020-01-09 | Disposition: A | Discharge status: Home / Self Care | Payer: Self-pay | Attending: Emergency Medicine | Admitting: Emergency Medicine

## 2020-01-08 ENCOUNTER — Encounter (HOSPITAL_BASED_OUTPATIENT_CLINIC_OR_DEPARTMENT_OTHER): Payer: Self-pay | Admitting: Emergency Medicine

## 2020-01-08 DIAGNOSIS — F17210 Nicotine dependence, cigarettes, uncomplicated: Secondary | ICD-10-CM | POA: Insufficient documentation

## 2020-01-08 DIAGNOSIS — M4720 Other spondylosis with radiculopathy, site unspecified: Secondary | ICD-10-CM

## 2020-01-08 DIAGNOSIS — M5417 Radiculopathy, lumbosacral region: Secondary | ICD-10-CM

## 2020-01-08 DIAGNOSIS — M4726 Other spondylosis with radiculopathy, lumbar region: Secondary | ICD-10-CM | POA: Insufficient documentation

## 2020-01-08 MED ORDER — NAPROXEN 250 MG PO TABS
500.0000 mg | ORAL_TABLET | Freq: Once | ORAL | Status: AC
  Administered 2020-01-08: 500 mg via ORAL
  Filled 2020-01-08: qty 2

## 2020-01-08 MED ORDER — LIDOCAINE 5 % EX PTCH
1.0000 | MEDICATED_PATCH | CUTANEOUS | Status: DC
  Administered 2020-01-08: 1 via TRANSDERMAL
  Filled 2020-01-08: qty 1

## 2020-01-08 NOTE — ED Notes (Signed)
Pt appears to be resting quietly upon entering room, appeared to be sleeping, did respond to light verbal stimuli. Pt is oreiented x 3, states pain is mostly at her neck area.

## 2020-01-08 NOTE — ED Triage Notes (Signed)
Brought by ems for neck and leg pain.  Denies any other complaints.

## 2020-01-08 NOTE — ED Notes (Signed)
Pt brought in by EMS from grocery store parking lot. Pt is homeless. C/o pain in leg and neck. Reportedly told EMS she received a pain patch here at a past visit that helped

## 2020-01-09 ENCOUNTER — Emergency Department (HOSPITAL_BASED_OUTPATIENT_CLINIC_OR_DEPARTMENT_OTHER)
Admission: EM | Admit: 2020-01-09 | Discharge: 2020-01-10 | Disposition: A | Discharge status: Home / Self Care | Payer: Self-pay | Attending: Emergency Medicine | Admitting: Emergency Medicine

## 2020-01-09 ENCOUNTER — Encounter (HOSPITAL_BASED_OUTPATIENT_CLINIC_OR_DEPARTMENT_OTHER): Payer: Self-pay | Admitting: *Deleted

## 2020-01-09 ENCOUNTER — Other Ambulatory Visit: Payer: Self-pay

## 2020-01-09 DIAGNOSIS — S161XXD Strain of muscle, fascia and tendon at neck level, subsequent encounter: Secondary | ICD-10-CM

## 2020-01-09 DIAGNOSIS — F17210 Nicotine dependence, cigarettes, uncomplicated: Secondary | ICD-10-CM | POA: Insufficient documentation

## 2020-01-09 DIAGNOSIS — M542 Cervicalgia: Secondary | ICD-10-CM

## 2020-01-09 DIAGNOSIS — S161XXA Strain of muscle, fascia and tendon at neck level, initial encounter: Secondary | ICD-10-CM | POA: Insufficient documentation

## 2020-01-09 DIAGNOSIS — Z5900 Homelessness unspecified: Secondary | ICD-10-CM | POA: Insufficient documentation

## 2020-01-09 DIAGNOSIS — X58XXXA Exposure to other specified factors, initial encounter: Secondary | ICD-10-CM | POA: Insufficient documentation

## 2020-01-09 MED ORDER — IBUPROFEN 400 MG PO TABS
600.0000 mg | ORAL_TABLET | Freq: Once | ORAL | Status: AC
  Administered 2020-01-09: 600 mg via ORAL
  Filled 2020-01-09: qty 1

## 2020-01-09 MED ORDER — LIDOCAINE 5 % EX PTCH
1.0000 | MEDICATED_PATCH | CUTANEOUS | Status: DC
  Administered 2020-01-09: 1 via TRANSDERMAL
  Filled 2020-01-09: qty 1

## 2020-01-09 NOTE — Discharge Instructions (Signed)
Take motrin and tylenol for pain. See your doctor in 1 week.

## 2020-01-09 NOTE — ED Notes (Signed)
Provided pt some cheese/ crackers/ juice and soda. Assisted to [facility] out desk.

## 2020-01-09 NOTE — ED Notes (Signed)
In to DC pt to home, appears to be sleeping, opens eyes to light verbal stimuli, AVS reviewed with pt and given to pt as well, opportunity for questions provided

## 2020-01-09 NOTE — ED Triage Notes (Addendum)
EMS transport from [facility]. Pt is homeless. C/o neck pain. Seen here last night for same. Pt in lobby eating and requesting blankets

## 2020-01-09 NOTE — ED Notes (Signed)
Assisted pt to exit via [facility]

## 2020-01-09 NOTE — ED Provider Notes (Signed)
[facility] EMERGENCY DEPARTMENT Provider Note   CSN: [id number] Arrival date & time: 01/09/20  2117     History Chief Complaint  Patient presents with   Neck Pain    Betty Henderson is a 59 y.o. female history of homelessness presented emergency department with chronic neck pain like pain.  The patient has been seen there yesterday evening for the same complaint.  He was given a Lidoderm patch and Motrin with improvement of her symptoms and discharge.  She returns again this evening with the same general complaints.  She was requesting food and given food in the lobby.  She is also requesting blankets.  Tells me she wants to sleep.  She does have a documented history of malingering and secondary-gain.  HPI     Past Medical History:  Diagnosis Date   Anemia    Arthritis    Chronic pain    Drug-seeking behavior    Malingering    Osteopetrosis    Psychosis (HCC)    Schizoaffective disorder, bipolar type (HCC)     There are no problems to display for this patient.   Past Surgical History:  Procedure Laterality Date   MOUTH SURGERY     TUBAL LIGATION       OB History   No obstetric history on file.     Family History  Family history unknown: Yes    Social History   Tobacco Use   Smoking status: Current Some Day Smoker    Packs/day: 1.00    Types: Cigarettes   Smokeless tobacco: Never Used  Vaping Use   Vaping Use: Never used  Substance Use Topics   Alcohol use: Yes   Drug use: Yes    Types: Cocaine    Comment: states "it's legal though"    Home Medications Prior to Admission medications   Medication Sig Start Date End Date Taking? Authorizing Provider  diclofenac Sodium (VOLTAREN) 1 % GEL Apply 4 g topically 4 (four) times daily. 06/17/19   Melene Plan, DO  lidocaine (LIDODERM) 5 % Place 1 patch onto the skin daily. Remove & Discard patch within 12 hours or as directed by MD 01/02/20   Nicanor Alcon, April, MD  ranitidine  (ZANTAC) 150 MG capsule Take 1 capsule (150 mg total) by mouth daily. Patient not taking: Reported on 03/20/2018 05/31/17 01/07/19  Lorre Nick, MD    Allergies    Lemon oil, Strawberry extract, Other, Adhesive  [tape], Cherry, and Proanthocyanidin  Review of Systems   Review of Systems  Constitutional: Negative for chills and fever.  Eyes: Negative for pain and visual disturbance.  Respiratory: Negative for cough and shortness of breath.   Cardiovascular: Negative for chest pain and palpitations.  Gastrointestinal: Negative for abdominal pain and vomiting.  Genitourinary: Negative for dysuria and hematuria.  Musculoskeletal: Positive for arthralgias, back pain, myalgias and neck pain.  Skin: Negative for color change and rash.  Neurological: Negative for syncope and headaches.  All other systems reviewed and are negative.   Physical Exam Updated Vital Signs BP 121/83 (BP Location: Right Arm)    Pulse 81    Temp 98.2 F (36.8 C) (Oral)    Resp 16    Ht 5\' 6"  (1.676 m)    Wt 72.5 kg    SpO2 100%    BMI 25.80 kg/m   Physical Exam Vitals and nursing note reviewed.  Constitutional:      General: She is not in acute distress.    Appearance:  She is well-developed.     Comments: Sleeping, blankets pulled over head  HENT:     Head: Normocephalic and atraumatic.  Eyes:     Conjunctiva/sclera: Conjunctivae normal.  Cardiovascular:     Rate and Rhythm: Normal rate and regular rhythm.     Pulses: Normal pulses.  Pulmonary:     Effort: Pulmonary effort is normal. No respiratory distress.  Abdominal:     Palpations: Abdomen is soft.     Tenderness: There is no abdominal tenderness.  Musculoskeletal:     Cervical back: Neck supple.     Comments: Trigger point ttp bilaterally of the neck, no spinal midline ttp  Skin:    General: Skin is warm and dry.  Neurological:     Mental Status: She is alert.     ED Results / Procedures / Treatments   Labs (all labs ordered are listed,  but only abnormal results are displayed) Labs Reviewed - No data to display  EKG None  Radiology No results found.  Procedures Procedures (including critical care time)  Medications Ordered in ED Medications  lidocaine (LIDODERM) 5 % 1 patch (has no administration in time range)  ibuprofen (ADVIL) tablet 600 mg (has no administration in time range)    ED Course  I have reviewed the triage vital signs and the nursing notes.  Pertinent labs & imaging results that were available during my care of the patient were reviewed by me and considered in my medical decision making (see chart for details).  This is a 59 year old female presenting to emergency department complaint of neck pain, which is chronic.  She has been seen multiple times in the ER over the past several days for similar complaints.  She is unfortunately homeless, and is felt to be visiting the ED for secondary gain, as she has repeatedly asked for blankets, place asleep, and food.  She was given some food today.  I do not believe there is a medical emergency or symptomatic pathology.  I gave her some Motrin and Lidoderm.  We will discharge her afterwards.    Final Clinical Impression(s) / ED Diagnoses Final diagnoses:  Neck pain  Strain of neck muscle, subsequent encounter    Rx / DC Orders ED Discharge Orders    None       Terald Sleeper, MD 01/09/20 2307

## 2020-01-10 NOTE — ED Notes (Signed)
Taken to the waiting room at this time.  Discharge reviewed.  Refused to sign.

## 2020-01-10 NOTE — ED Provider Notes (Signed)
[facility] EMERGENCY DEPARTMENT Provider Note   CSN: [id number] Arrival date & time: 01/08/20  2018     History Chief Complaint  Patient presents with  . Neck Pain  . Leg Pain    Tanishi Nault is a 59 y.o. female.  HPI    59 year old female comes in a chief complaint of leg pain.  Patient reports that she has history of sciatica and she is having pain in both of her feet.  She also reports having neck pain and slipped disc.  She is not taking any pain medication at this time.  Patient denies any recent trauma.  Past Medical History:  Diagnosis Date  . Anemia   . Arthritis   . Chronic pain   . Drug-seeking behavior   . Malingering   . Osteopetrosis   . Psychosis (HCC)   . Schizoaffective disorder, bipolar type (HCC)     There are no problems to display for this patient.   Past Surgical History:  Procedure Laterality Date  . MOUTH SURGERY    . TUBAL LIGATION       OB History   No obstetric history on file.     Family History  Family history unknown: Yes    Social History   Tobacco Use  . Smoking status: Current Some Day Smoker    Packs/day: 1.00    Types: Cigarettes  . Smokeless tobacco: Never Used  Vaping Use  . Vaping Use: Never used  Substance Use Topics  . Alcohol use: Yes  . Drug use: Yes    Types: Cocaine    Comment: states "it's legal though"    Home Medications Prior to Admission medications   Medication Sig Start Date End Date Taking? Authorizing Provider  diclofenac Sodium (VOLTAREN) 1 % GEL Apply 4 g topically 4 (four) times daily. 06/17/19   Melene Plan, DO  lidocaine (LIDODERM) 5 % Place 1 patch onto the skin daily. Remove & Discard patch within 12 hours or as directed by MD 01/02/20   Nicanor Alcon, April, MD  ranitidine (ZANTAC) 150 MG capsule Take 1 capsule (150 mg total) by mouth daily. Patient not taking: Reported on 03/20/2018 05/31/17 01/07/19  Lorre Nick, MD    Allergies    Lemon oil, Strawberry extract,  Other, Adhesive  [tape], Cherry, and Proanthocyanidin  Review of Systems   Review of Systems  Constitutional: Positive for activity change.  Musculoskeletal: Positive for arthralgias.  All other systems reviewed and are negative.   Physical Exam Updated Vital Signs BP 113/75 (BP Location: Right Arm)   Pulse 83   Temp 97.6 F (36.4 C) (Oral)   Resp 18   Ht 5\' 6"  (1.676 m)   Wt 72.6 kg   SpO2 100%   BMI 25.82 kg/m   Physical Exam Vitals and nursing note reviewed.  Constitutional:      Appearance: She is well-developed.  HENT:     Head: Normocephalic and atraumatic.  Eyes:     Pupils: Pupils are equal, round, and reactive to light.  Cardiovascular:     Rate and Rhythm: Normal rate and regular rhythm.     Heart sounds: Normal heart sounds. No murmur heard.   Pulmonary:     Effort: Pulmonary effort is normal. No respiratory distress.  Abdominal:     General: There is no distension.     Palpations: Abdomen is soft.     Tenderness: There is no abdominal tenderness. There is no guarding or rebound.  Musculoskeletal:  Cervical back: Neck supple.     Comments: Pt has tenderness over the lumbar region No step offs, no erythema. Pt has 1+ patellar reflex bilaterally. Able to discriminate between sharp and dull. Able to ambulate   Skin:    General: Skin is warm and dry.  Neurological:     Mental Status: She is alert and oriented to person, place, and time.     ED Results / Procedures / Treatments   Labs (all labs ordered are listed, but only abnormal results are displayed) Labs Reviewed - No data to display  EKG None  Radiology No results found.  Procedures Procedures (including critical care time)  Medications Ordered in ED Medications  naproxen (NAPROSYN) tablet 500 mg (500 mg Oral Given 01/08/20 2356)    ED Course  I have reviewed the triage vital signs and the nursing notes.  Pertinent labs & imaging results that were available during my care of  the patient were reviewed by me and considered in my medical decision making (see chart for details).    MDM Rules/Calculators/A&P                          Patient comes in a chief complaint of back and neck pain.  She has history of degenerative spine disease.  No findings concerning for spine infection, cord compression.  Stable for discharge with oral pain meds.  Final Clinical Impression(s) / ED Diagnoses Final diagnoses:  Lumbosacral radiculopathy  Osteoarthritis of spine with radiculopathy, unspecified spinal region    Rx / DC Orders ED Discharge Orders    None       Derwood Kaplan, MD 01/10/20 0730

## 2020-01-12 ENCOUNTER — Other Ambulatory Visit: Payer: Self-pay

## 2020-01-12 ENCOUNTER — Encounter (HOSPITAL_BASED_OUTPATIENT_CLINIC_OR_DEPARTMENT_OTHER): Payer: Self-pay | Admitting: *Deleted

## 2020-01-12 ENCOUNTER — Emergency Department (HOSPITAL_BASED_OUTPATIENT_CLINIC_OR_DEPARTMENT_OTHER)
Admission: EM | Admit: 2020-01-12 | Discharge: 2020-01-13 | Disposition: A | Discharge status: Home / Self Care | Payer: Self-pay | Attending: Emergency Medicine | Admitting: Emergency Medicine

## 2020-01-12 DIAGNOSIS — M545 Low back pain, unspecified: Secondary | ICD-10-CM | POA: Insufficient documentation

## 2020-01-12 DIAGNOSIS — M25552 Pain in left hip: Secondary | ICD-10-CM | POA: Insufficient documentation

## 2020-01-12 DIAGNOSIS — G8929 Other chronic pain: Secondary | ICD-10-CM

## 2020-01-12 DIAGNOSIS — F17210 Nicotine dependence, cigarettes, uncomplicated: Secondary | ICD-10-CM | POA: Insufficient documentation

## 2020-01-12 NOTE — ED Triage Notes (Addendum)
Pt c/o back pain, hx of same, requesting " body cast".  , brought in by ems , homeless, pt rambling in triage

## 2020-01-13 MED ORDER — CYCLOBENZAPRINE HCL 10 MG PO TABS
10.0000 mg | ORAL_TABLET | Freq: Once | ORAL | Status: AC
  Administered 2020-01-13: 10 mg via ORAL
  Filled 2020-01-13: qty 1

## 2020-01-13 MED ORDER — IBUPROFEN 400 MG PO TABS
600.0000 mg | ORAL_TABLET | Freq: Once | ORAL | Status: AC
  Administered 2020-01-13: 600 mg via ORAL
  Filled 2020-01-13: qty 1

## 2020-01-13 NOTE — Discharge Instructions (Addendum)
Take ibuprofen 600 mg every 6 hours as needed for pain.  Follow-up with your primary doctor in the next week if symptoms are not improving.

## 2020-01-13 NOTE — ED Provider Notes (Signed)
[facility] EMERGENCY DEPARTMENT Provider Note   CSN: [id number] Arrival date & time: 01/12/20  2133     History Chief Complaint  Patient presents with  . Back Pain    Betty Henderson is a 59 y.o. female.  Patient is a 59 year old female with past medical history of schizoaffective disorder, chronic pain, malingering.  Patient presents today for evaluation of low back and left hip pain.  This has been bothering her for 10 years.  Patient brought here by EMS.  She denies any new injury or trauma.  She denies any weakness, numbness, or bowel or bladder complaints.  Patient has been seen here multiple times with similar complaints.   The history is provided by the patient.       Past Medical History:  Diagnosis Date  . Anemia   . Arthritis   . Chronic pain   . Drug-seeking behavior   . Malingering   . Osteopetrosis   . Psychosis (HCC)   . Schizoaffective disorder, bipolar type (HCC)     There are no problems to display for this patient.   Past Surgical History:  Procedure Laterality Date  . MOUTH SURGERY    . TUBAL LIGATION       OB History   No obstetric history on file.     Family History  Family history unknown: Yes    Social History   Tobacco Use  . Smoking status: Current Some Day Smoker    Packs/day: 1.00    Types: Cigarettes  . Smokeless tobacco: Never Used  Vaping Use  . Vaping Use: Never used  Substance Use Topics  . Alcohol use: Yes  . Drug use: Yes    Types: Cocaine    Comment: states "it's legal though"    Home Medications Prior to Admission medications   Medication Sig Start Date End Date Taking? Authorizing Provider  diclofenac Sodium (VOLTAREN) 1 % GEL Apply 4 g topically 4 (four) times daily. 06/17/19   Melene Plan, DO  lidocaine (LIDODERM) 5 % Place 1 patch onto the skin daily. Remove & Discard patch within 12 hours or as directed by MD 01/02/20   Nicanor Alcon, April, MD  ranitidine (ZANTAC) 150 MG capsule Take 1 capsule  (150 mg total) by mouth daily. Patient not taking: Reported on 03/20/2018 05/31/17 01/07/19  Lorre Nick, MD    Allergies    Lemon oil, Strawberry extract, Other, Adhesive  [tape], Cherry, and Proanthocyanidin  Review of Systems   Review of Systems  All other systems reviewed and are negative.   Physical Exam Updated Vital Signs BP 129/90   Pulse 91   Temp 98.5 F (36.9 C) (Oral)   Resp 18   Ht 5\' 6"  (1.676 m)   Wt 74.8 kg   SpO2 100%   BMI 26.63 kg/m   Physical Exam Vitals and nursing note reviewed.  Constitutional:      General: She is not in acute distress.    Appearance: She is not ill-appearing.  HENT:     Head: Normocephalic and atraumatic.  Pulmonary:     Effort: Pulmonary effort is normal.  Musculoskeletal:     Comments: There is tenderness to palpation of the soft tissues of the left lower lumbar region.  Strength is 5 out of 5 in both lower extremities.  She is ambulatory without difficulty.  Skin:    General: Skin is warm and dry.  Neurological:     Mental Status: She is alert.  ED Results / Procedures / Treatments   Labs (all labs ordered are listed, but only abnormal results are displayed) Labs Reviewed - No data to display  EKG None  Radiology No results found.  Procedures Procedures (including critical care time)  Medications Ordered in ED Medications  ibuprofen (ADVIL) tablet 600 mg (has no administration in time range)  cyclobenzaprine (FLEXERIL) tablet 10 mg (has no administration in time range)    ED Course  I have reviewed the triage vital signs and the nursing notes.  Pertinent labs & imaging results that were available during my care of the patient were reviewed by me and considered in my medical decision making (see chart for details).    MDM Rules/Calculators/A&P  Patient with history of homelessness, schizophrenia, and chronic back pain presenting for the third time this week with low back pain.  There is nothing  today that would suggest an emergent situation.  Her speech is somewhat rambling and difficult to comprehend, however this appears to be her baseline.  I see no indication for any emergent testing.  Patient will be given ibuprofen and Flexeril.  She will be discharged with as needed return.  Final Clinical Impression(s) / ED Diagnoses Final diagnoses:  None    Rx / DC Orders ED Discharge Orders    None       Geoffery Lyons, MD 01/13/20 [phone]

## 2020-02-04 IMAGING — DX DG CHEST 1V PORT
1 series · 1 of 1 positions shown · non-contrast
Comparison: Chest radiograph 01/07/2019

CLINICAL DATA: Cough.

EXAM:
PORTABLE CHEST 1 VIEW

[chest]
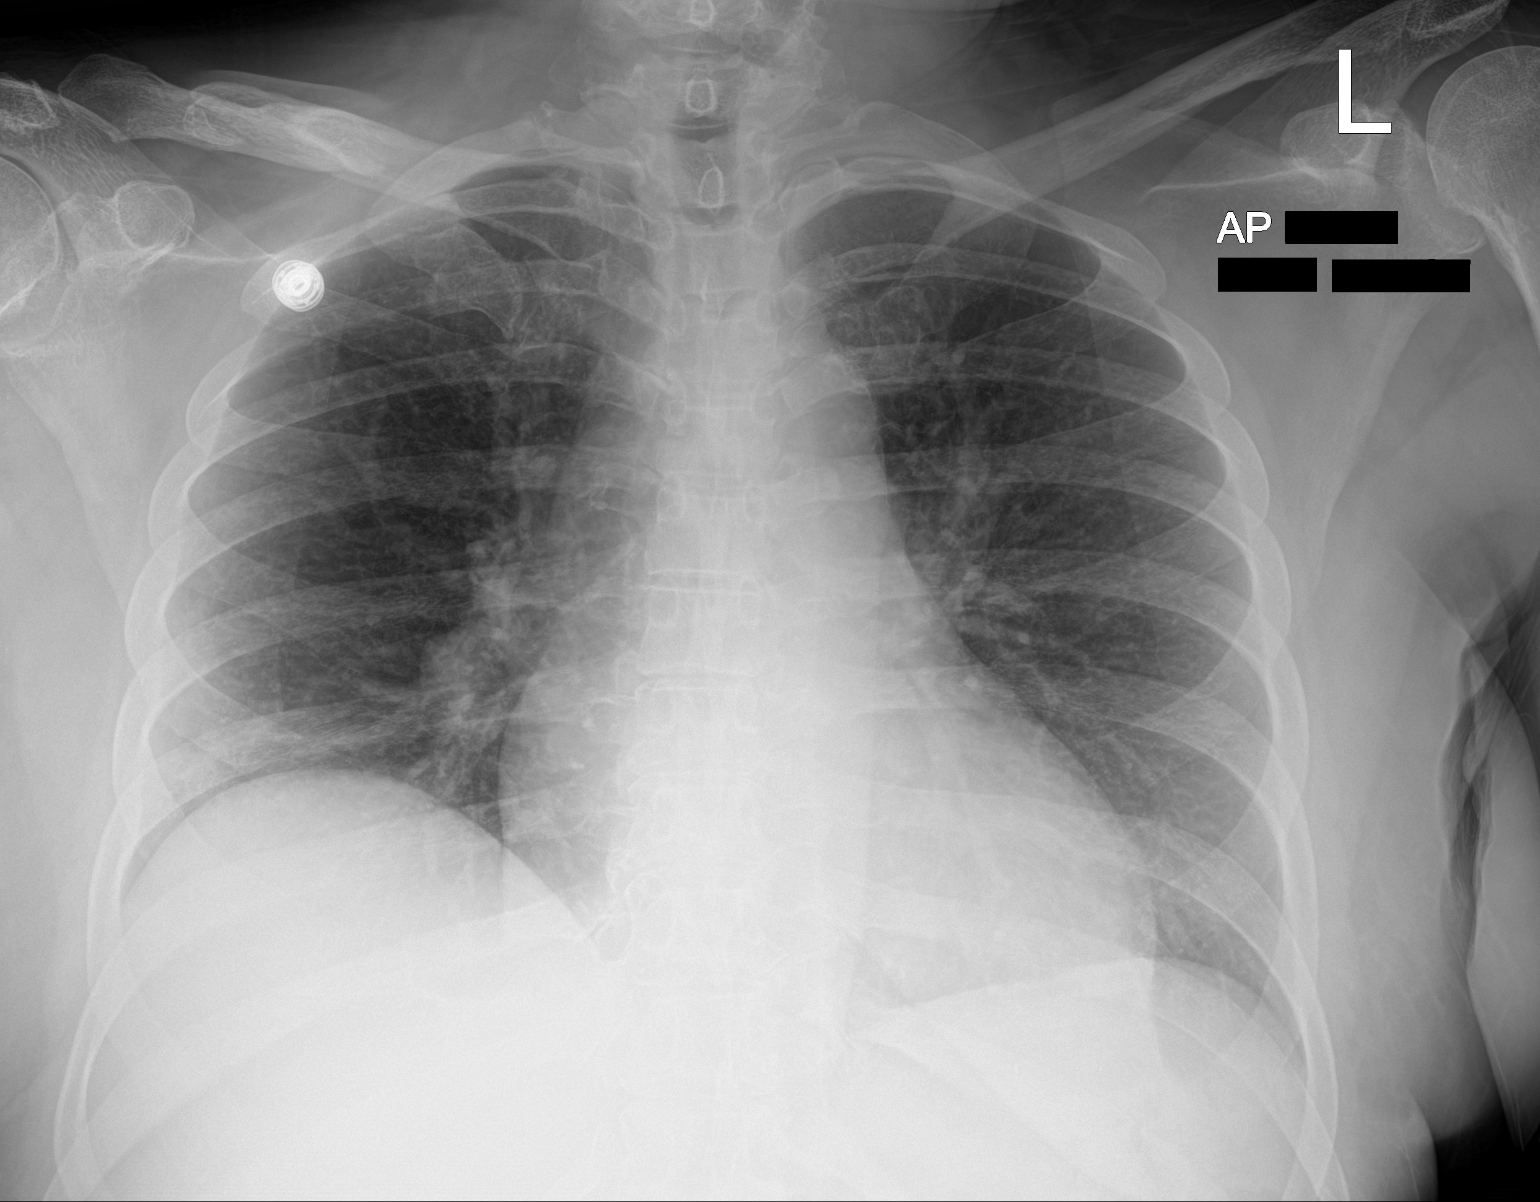

[1 of 1 positions shown; findings below may reference images not displayed]

FINDINGS: Heart size within normal limits.

There is no airspace consolidation within the lungs.

No evidence of pleural effusion or pneumothorax.

Elevation of the right hemidiaphragm is more pronounced as compared
to prior examination.

No acute bony abnormality.
IMPRESSION: No airspace consolidation within the lungs.

Elevation of the right hemidiaphragm is more pronounced than on
prior examinations, nonspecific.

## 2020-02-07 DIAGNOSIS — Z91148 Patient's other noncompliance with medication regimen for other reason: Secondary | ICD-10-CM | POA: Insufficient documentation

## 2020-02-17 ENCOUNTER — Encounter (HOSPITAL_BASED_OUTPATIENT_CLINIC_OR_DEPARTMENT_OTHER): Payer: Self-pay | Admitting: Emergency Medicine

## 2020-02-17 ENCOUNTER — Emergency Department (HOSPITAL_BASED_OUTPATIENT_CLINIC_OR_DEPARTMENT_OTHER)
Admission: EM | Admit: 2020-02-17 | Discharge: 2020-02-17 | Disposition: A | Discharge status: Home / Self Care | Payer: Self-pay | Attending: Emergency Medicine | Admitting: Emergency Medicine

## 2020-02-17 ENCOUNTER — Other Ambulatory Visit: Payer: Self-pay

## 2020-02-17 DIAGNOSIS — M79671 Pain in right foot: Secondary | ICD-10-CM | POA: Insufficient documentation

## 2020-02-17 DIAGNOSIS — M79672 Pain in left foot: Secondary | ICD-10-CM | POA: Insufficient documentation

## 2020-02-17 DIAGNOSIS — F17210 Nicotine dependence, cigarettes, uncomplicated: Secondary | ICD-10-CM | POA: Insufficient documentation

## 2020-02-17 MED ORDER — ACETAMINOPHEN 325 MG PO TABS
650.0000 mg | ORAL_TABLET | Freq: Once | ORAL | Status: AC
  Administered 2020-02-17: 650 mg via ORAL
  Filled 2020-02-17: qty 2

## 2020-02-17 NOTE — Discharge Instructions (Addendum)
Please take Tylenol [phone] mg every 6 hours as needed for pain.  Please follow-up with your primary care doctor.

## 2020-02-17 NOTE — ED Triage Notes (Signed)
Ems picked pt from laundry mat, bilateral leg pain, was seen yesterday foe same symptoms. Alert and oriented x 4. Hx homelessness.

## 2020-02-17 NOTE — ED Provider Notes (Signed)
[facility] EMERGENCY DEPARTMENT Provider Note   CSN: [id number] Arrival date & time: 02/17/20  1743     History Chief Complaint  Patient presents with  . Leg Pain    bilateral    Haroldine Redler is a 59 y.o. female.  HPI Patient is a 59 year old female with past medical history significant for malingering, psychosis, anemia, arthritis, chronic pain, drug-seeking behavior.  Patient is presented today after she called out to EMS from the [facility] complaining of bilateral leg pain that is been ongoing for 3 months per patient. She states that it is achy and constant she states that she has been told she has arthritis and states that she has not been taking any other medications that she was prescribed for this including Tylenol.  She states is 6/10.  She denies any changes in his symptoms today.  When asked what prompted her to call EMS she states "because it hurt real bad "she denies any difficulty walking, fevers, chills, difficulty urinating or moving her bowels.  She denies any back pain any lightheadedness dizziness denies any history of cancer or IV drug use.  No other associated symptoms.  No aggravating mitigating factors.  During physical exam further questioning and palpation elicited that patient is actually complaining of bilateral foot pain and denies any other leg pain, calf pain, thigh pain.    Past Medical History:  Diagnosis Date  . Anemia   . Arthritis   . Chronic pain   . Drug-seeking behavior   . Malingering   . Osteopetrosis   . Psychosis (HCC)   . Schizoaffective disorder, bipolar type (HCC)     There are no problems to display for this patient.   Past Surgical History:  Procedure Laterality Date  . MOUTH SURGERY    . TUBAL LIGATION       OB History   No obstetric history on file.     Family History  Family history unknown: Yes    Social History   Tobacco Use  . Smoking status: Current Some Day Smoker    Packs/day: 1.00     Types: Cigarettes  . Smokeless tobacco: Never Used  Vaping Use  . Vaping Use: Never used  Substance Use Topics  . Alcohol use: Yes  . Drug use: Yes    Types: Cocaine    Comment: states "it's legal though"    Home Medications Prior to Admission medications   Medication Sig Start Date End Date Taking? Authorizing Provider  diclofenac Sodium (VOLTAREN) 1 % GEL Apply 4 g topically 4 (four) times daily. 06/17/19   Melene Plan, DO  lidocaine (LIDODERM) 5 % Place 1 patch onto the skin daily. Remove & Discard patch within 12 hours or as directed by MD 01/02/20   Nicanor Alcon, April, MD  ranitidine (ZANTAC) 150 MG capsule Take 1 capsule (150 mg total) by mouth daily. Patient not taking: Reported on 03/20/2018 05/31/17 01/07/19  Lorre Nick, MD    Allergies    Lemon oil, Strawberry extract, Other, Adhesive  [tape], Cherry, and Proanthocyanidin  Review of Systems   Review of Systems  Constitutional: Negative for fever.  HENT: Negative for congestion.   Respiratory: Negative for shortness of breath.   Cardiovascular: Negative for chest pain.  Gastrointestinal: Negative for abdominal distention.  Musculoskeletal:       Bilateral leg pain/foot pain  Neurological: Negative for dizziness and headaches.    Physical Exam Updated Vital Signs BP (!) 118/57   Pulse 90  Temp 98 F (36.7 C)   Resp 18   SpO2 98%   Physical Exam Vitals and nursing note reviewed.  Constitutional:      General: She is not in acute distress.    Appearance: Normal appearance. She is not ill-appearing.  HENT:     Head: Normocephalic and atraumatic.  Eyes:     General: No scleral icterus.       Right eye: No discharge.        Left eye: No discharge.     Conjunctiva/sclera: Conjunctivae normal.  Cardiovascular:     Comments: Bilateral DP PT pulses 3+ and symmetric Pulmonary:     Effort: Pulmonary effort is normal.     Breath sounds: No stridor.  Musculoskeletal:     Comments: Strength 5/5 in bilateral  feet ankles knees and hips  No tenderness palpation of lower extremities.  Skin:    General: Skin is warm and dry.     Capillary Refill: Capillary refill takes less than 2 seconds.     Comments: Bilateral feet without ulcers, lesions, abrasions, wounds  Neurological:     Mental Status: She is alert and oriented to person, place, and time. Mental status is at baseline.     Comments: Very good sensation bilateral feet  Psychiatric:     Comments: Somewhat bizarre at times.  Able answer questions appropriately follow commands and is fully oriented.  Somewhat tangential thinking at times.  Denies SI, HI, AVH.     ED Results / Procedures / Treatments   Labs (all labs ordered are listed, but only abnormal results are displayed) Labs Reviewed - No data to display  EKG None  Radiology No results found.  Procedures Procedures (including critical care time)  Medications Ordered in ED Medications  acetaminophen (TYLENOL) tablet 650 mg (650 mg Oral Given 02/17/20 1838)    ED Course  I have reviewed the triage vital signs and the nursing notes.  Pertinent labs & imaging results that were available during my care of the patient were reviewed by me and considered in my medical decision making (see chart for details).    MDM Rules/Calculators/A&P                          Patient is 59 year old female who is homeless with a history of chronic bilateral leg pain presented today with bilateral foot pain.  She states that has been ongoing for quite some time.  She is unable to give me any details of how long however it appears that is been ongoing for greater than 3 months.  Patient has been seen numerous times for similar complaints.  Her physical exam is reassuring she has good sensation, movement, blood flow with good pulses and Overall reassuring exam of the lower extremities.  No other significant abnormalities on physical exam.  Patient given 1 dose of Tylenol as she has not had  anything for pain today.  Discharged with return precautions and instructions to follow-up with PCP.  Final Clinical Impression(s) / ED Diagnoses Final diagnoses:  Bilateral foot pain    Rx / DC Orders ED Discharge Orders    None       Gailen Shelter, [place] 02/17/20 2216    Little, Ambrose Finland, MD 02/17/20 2325

## 2020-02-21 ENCOUNTER — Encounter (HOSPITAL_BASED_OUTPATIENT_CLINIC_OR_DEPARTMENT_OTHER): Payer: Self-pay | Admitting: Emergency Medicine

## 2020-02-21 ENCOUNTER — Emergency Department (HOSPITAL_BASED_OUTPATIENT_CLINIC_OR_DEPARTMENT_OTHER)
Admission: EM | Admit: 2020-02-21 | Discharge: 2020-02-21 | Disposition: A | Discharge status: Home / Self Care | Payer: Self-pay | Attending: Emergency Medicine | Admitting: Emergency Medicine

## 2020-02-21 ENCOUNTER — Other Ambulatory Visit: Payer: Self-pay

## 2020-02-21 DIAGNOSIS — F17210 Nicotine dependence, cigarettes, uncomplicated: Secondary | ICD-10-CM | POA: Insufficient documentation

## 2020-02-21 DIAGNOSIS — M25552 Pain in left hip: Secondary | ICD-10-CM | POA: Insufficient documentation

## 2020-02-21 MED ORDER — ACETAMINOPHEN 325 MG PO TABS: 650.0000 mg | ORAL_TABLET | Freq: Once | ORAL | Status: DC
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
## 2020-02-21 NOTE — ED Provider Notes (Signed)
[facility] EMERGENCY DEPARTMENT Provider Note   CSN: [id number] Arrival date & time: 02/21/20  0151     History Chief Complaint  Patient presents with  . Hip Pain    Betty Henderson is a 59 y.o. female.  HPI     This is a 59 year old female with a history of schizoaffective disorder, osteoporosis, homelessness who presents with hip pain.  Patient reports left hip pain and "I think it is dislocated."  She reports that she was seen at [facility] and "they could not do anything for me."  She did have x-rays there that were negative.  I have reviewed her outside records.  Patient denies any recent falls.  She cannot really tell me what makes her pain better or worse.  She does report that she has pain with ambulation.  She rates her pain 8 out of 10.  Denies urinary symptoms, weakness, numbness, tingling.  She is overall a very poor historian.  She is well-known to our system.  She currently is homeless.  Past Medical History:  Diagnosis Date  . Anemia   . Arthritis   . Chronic pain   . Drug-seeking behavior   . Malingering   . Osteopetrosis   . Psychosis (HCC)   . Schizoaffective disorder, bipolar type (HCC)     There are no problems to display for this patient.   Past Surgical History:  Procedure Laterality Date  . MOUTH SURGERY    . TUBAL LIGATION       OB History   No obstetric history on file.     Family History  Family history unknown: Yes    Social History   Tobacco Use  . Smoking status: Current Some Day Smoker    Packs/day: 1.00    Types: Cigarettes  . Smokeless tobacco: Never Used  Vaping Use  . Vaping Use: Never used  Substance Use Topics  . Alcohol use: Yes  . Drug use: Yes    Types: Cocaine    Comment: states "it's legal though"    Home Medications Prior to Admission medications   Medication Sig Start Date End Date Taking? Authorizing Provider  diclofenac Sodium (VOLTAREN) 1 % GEL Apply 4 g topically 4 (four)  times daily. 06/17/19   Melene Plan, DO  lidocaine (LIDODERM) 5 % Place 1 patch onto the skin daily. Remove & Discard patch within 12 hours or as directed by MD 01/02/20   Nicanor Alcon, April, MD  ranitidine (ZANTAC) 150 MG capsule Take 1 capsule (150 mg total) by mouth daily. Patient not taking: Reported on 03/20/2018 05/31/17 01/07/19  Lorre Nick, MD    Allergies    Lemon oil, Strawberry extract, Other, Adhesive  [tape], Cherry, and Proanthocyanidin  Review of Systems   Review of Systems  Constitutional: Negative for fever.  Genitourinary: Negative for dysuria.  Musculoskeletal:       Hip pain  Neurological: Negative for weakness and numbness.  All other systems reviewed and are negative.   Physical Exam Updated Vital Signs BP 124/68 (BP Location: Right Arm)   Pulse 63   Temp 98.3 F (36.8 C) (Oral)   Resp 18   Ht 1.676 m (5\' 6" )   Wt 72.6 kg   SpO2 100%   BMI 25.84 kg/m   Physical Exam Vitals and nursing note reviewed.  Constitutional:      Appearance: She is well-developed.     Comments: Disheveled but non-ill-appearing  HENT:     Head: Normocephalic and  atraumatic.     Mouth/Throat:     Mouth: Mucous membranes are moist.  Eyes:     Pupils: Pupils are equal, round, and reactive to light.  Cardiovascular:     Rate and Rhythm: Normal rate and regular rhythm.  Pulmonary:     Effort: Pulmonary effort is normal. No respiratory distress.  Abdominal:     Palpations: Abdomen is soft.     Tenderness: There is no abdominal tenderness.  Musculoskeletal:        General: No tenderness or deformity. Normal range of motion.     Cervical back: Neck supple.     Comments: Normal range of motion, no obvious deformity  Skin:    General: Skin is warm and dry.  Neurological:     Mental Status: She is alert and oriented to person, place, and time.  Psychiatric:     Comments: Tangential and slightly pressured speech     ED Results / Procedures / Treatments   Labs (all labs  ordered are listed, but only abnormal results are displayed) Labs Reviewed - No data to display  EKG None  Radiology No results found.  Procedures Procedures (including critical care time)  Medications Ordered in ED Medications - No data to display  ED Course  I have reviewed the triage vital signs and the nursing notes.  Pertinent labs & imaging results that were available during my care of the patient were reviewed by me and considered in my medical decision making (see chart for details).    MDM Rules/Calculators/A&P                          Patient presents with reported left hip pain.  Overall nontoxic but appropriate for sure.  She is well-known to our emergency department.  She appears her baseline.  There is no obvious hip deformity and x-rays at outside facility were negative for acute fracture or dislocation.  No obvious knee injury.  Patient does not have a place to stay tonight and I suspect part of her presentation may be related to inadequate housing and social issues.  She received Tylenol at outside facility prior to arrival.  Will allow her to rest and will discharge first thing in the morning.  Doubt acute emergent process.  After history, exam, and medical workup I feel the patient has been appropriately medically screened and is safe for discharge home. Pertinent diagnoses were discussed with the patient. Patient was given return precautions.  Final Clinical Impression(s) / ED Diagnoses Final diagnoses:  Left hip pain    Rx / DC Orders ED Discharge Orders    None       Shon Baton, MD 02/21/20 [phone]

## 2020-02-26 ENCOUNTER — Emergency Department (HOSPITAL_BASED_OUTPATIENT_CLINIC_OR_DEPARTMENT_OTHER): Payer: Self-pay

## 2020-02-26 ENCOUNTER — Other Ambulatory Visit: Payer: Self-pay

## 2020-02-26 ENCOUNTER — Encounter (HOSPITAL_BASED_OUTPATIENT_CLINIC_OR_DEPARTMENT_OTHER): Payer: Self-pay | Admitting: Emergency Medicine

## 2020-02-26 ENCOUNTER — Emergency Department (HOSPITAL_BASED_OUTPATIENT_CLINIC_OR_DEPARTMENT_OTHER)
Admission: EM | Admit: 2020-02-26 | Discharge: 2020-02-27 | Disposition: A | Discharge status: Home / Self Care | Payer: Self-pay | Attending: Emergency Medicine | Admitting: Emergency Medicine

## 2020-02-26 DIAGNOSIS — M25559 Pain in unspecified hip: Secondary | ICD-10-CM

## 2020-02-26 DIAGNOSIS — M25552 Pain in left hip: Secondary | ICD-10-CM | POA: Insufficient documentation

## 2020-02-26 DIAGNOSIS — Z79899 Other long term (current) drug therapy: Secondary | ICD-10-CM | POA: Insufficient documentation

## 2020-02-26 DIAGNOSIS — M1610 Unilateral primary osteoarthritis, unspecified hip: Secondary | ICD-10-CM

## 2020-02-26 DIAGNOSIS — M169 Osteoarthritis of hip, unspecified: Secondary | ICD-10-CM | POA: Insufficient documentation

## 2020-02-26 DIAGNOSIS — F17210 Nicotine dependence, cigarettes, uncomplicated: Secondary | ICD-10-CM | POA: Insufficient documentation

## 2020-02-26 NOTE — ED Notes (Signed)
Pt does not exhibit any s/s of SI HI
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
## 2020-02-27 NOTE — ED Notes (Signed)
Pt seen here for similar complaints multiple times; pt asked for cheese and crackers and coffee and soda; pt given water and crackers; pt stable on discharge
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
## 2020-03-06 ENCOUNTER — Emergency Department (HOSPITAL_BASED_OUTPATIENT_CLINIC_OR_DEPARTMENT_OTHER)
Admission: EM | Admit: 2020-03-06 | Discharge: 2020-03-06 | Disposition: A | Discharge status: Home / Self Care | Payer: Self-pay | Attending: Emergency Medicine | Admitting: Emergency Medicine

## 2020-03-06 ENCOUNTER — Other Ambulatory Visit: Payer: Self-pay

## 2020-03-06 ENCOUNTER — Encounter (HOSPITAL_BASED_OUTPATIENT_CLINIC_OR_DEPARTMENT_OTHER): Payer: Self-pay | Admitting: *Deleted

## 2020-03-06 DIAGNOSIS — F17210 Nicotine dependence, cigarettes, uncomplicated: Secondary | ICD-10-CM | POA: Insufficient documentation

## 2020-03-06 DIAGNOSIS — L309 Dermatitis, unspecified: Secondary | ICD-10-CM | POA: Insufficient documentation

## 2020-03-06 NOTE — ED Triage Notes (Signed)
To ER via EMS states she has shingles. The rash she showed me on her left lower leg does not look like shingles.

## 2020-03-06 NOTE — ED Provider Notes (Signed)
[facility] EMERGENCY DEPARTMENT Provider Note   CSN: [id number] Arrival date & time: 03/06/20  1826     History No chief complaint on file.   Betty Henderson is a 59 y.o. female.  The history is provided by the patient. No language interpreter was used.  Rash Location:  Full body Quality: itchiness   Severity:  Moderate Onset quality:  Gradual Timing:  Constant Chronicity:  New Context: not sick contacts   Relieved by:  Nothing Worsened by:  Nothing Ineffective treatments:  None tried Associated symptoms: no nausea   Pt worried that she has shingles.  Pt complains of a fully body rash.       Past Medical History:  Diagnosis Date  . Anemia   . Arthritis   . Chronic pain   . Drug-seeking behavior   . Malingering   . Osteopetrosis   . Psychosis (HCC)   . Schizoaffective disorder, bipolar type (HCC)     There are no problems to display for this patient.   Past Surgical History:  Procedure Laterality Date  . MOUTH SURGERY    . TUBAL LIGATION       OB History   No obstetric history on file.     Family History  Family history unknown: Yes    Social History   Tobacco Use  . Smoking status: Current Some Day Smoker    Packs/day: 1.00    Types: Cigarettes  . Smokeless tobacco: Never Used  Vaping Use  . Vaping Use: Never used  Substance Use Topics  . Alcohol use: Yes  . Drug use: Yes    Types: Cocaine    Comment: states "it's legal though"    Home Medications Prior to Admission medications   Medication Sig Start Date End Date Taking? Authorizing Provider  diclofenac Sodium (VOLTAREN) 1 % GEL Apply 4 g topically 4 (four) times daily. 06/17/19   Melene Plan, DO  lidocaine (LIDODERM) 5 % Place 1 patch onto the skin daily. Remove & Discard patch within 12 hours or as directed by MD 01/02/20   Nicanor Alcon, April, MD  ranitidine (ZANTAC) 150 MG capsule Take 1 capsule (150 mg total) by mouth daily. Patient not taking: Reported on 03/20/2018  05/31/17 01/07/19  Lorre Nick, MD    Allergies    Lemon oil, Strawberry extract, Other, Adhesive  [tape], Cherry, and Proanthocyanidin  Review of Systems   Review of Systems  Gastrointestinal: Negative for nausea.  Skin: Positive for rash.  All other systems reviewed and are negative.   Physical Exam Updated Vital Signs BP 117/60   Pulse 86   Temp 98 F (36.7 C) (Oral)   Resp 18   Ht 5\' 6"  (1.676 m)   Wt 68 kg   SpO2 100%   BMI 24.20 kg/m   Physical Exam Vitals and nursing note reviewed.  Constitutional:      Appearance: She is well-developed and well-nourished.  HENT:     Head: Normocephalic.  Eyes:     Extraocular Movements: EOM normal.  Pulmonary:     Effort: Pulmonary effort is normal.  Abdominal:     General: There is no distension.  Musculoskeletal:        General: Normal range of motion.     Cervical back: Normal range of motion.  Skin:    Comments: Dry skin scattered small areas of erythema   Neurological:     Mental Status: She is alert and oriented to person, place, and time.  Psychiatric:  Mood and Affect: Mood and affect and mood normal.     ED Results / Procedures / Treatments   Labs (all labs ordered are listed, but only abnormal results are displayed) Labs Reviewed - No data to display  EKG None  Radiology No results found.  Procedures Procedures (including critical care time)  Medications Ordered in ED Medications - No data to display  ED Course  I have reviewed the triage vital signs and the nursing notes.  Pertinent labs & imaging results that were available during my care of the patient were reviewed by me and considered in my medical decision making (see chart for details).    MDM Rules/Calculators/A&P                          MDM;  Pt counseled on rash,  Pt advised to use a moisturizer.  Final Clinical Impression(s) / ED Diagnoses Final diagnoses:  Eczema, unspecified type    Rx / DC Orders ED Discharge  Orders    None    An After Visit Summary was printed and given to the patient.    Osie Cheeks 03/06/20 1954    Pollyann Savoy, MD 03/06/20 2117

## 2020-03-31 IMAGING — DX DG CHEST 1V PORT
1 series · 1 of 1 positions shown · non-contrast
Comparison: 03/03/2019

CLINICAL DATA: Chills and cough for 24 hours, [id number] positivity

EXAM:
PORTABLE CHEST 1 VIEW

[chest ap]
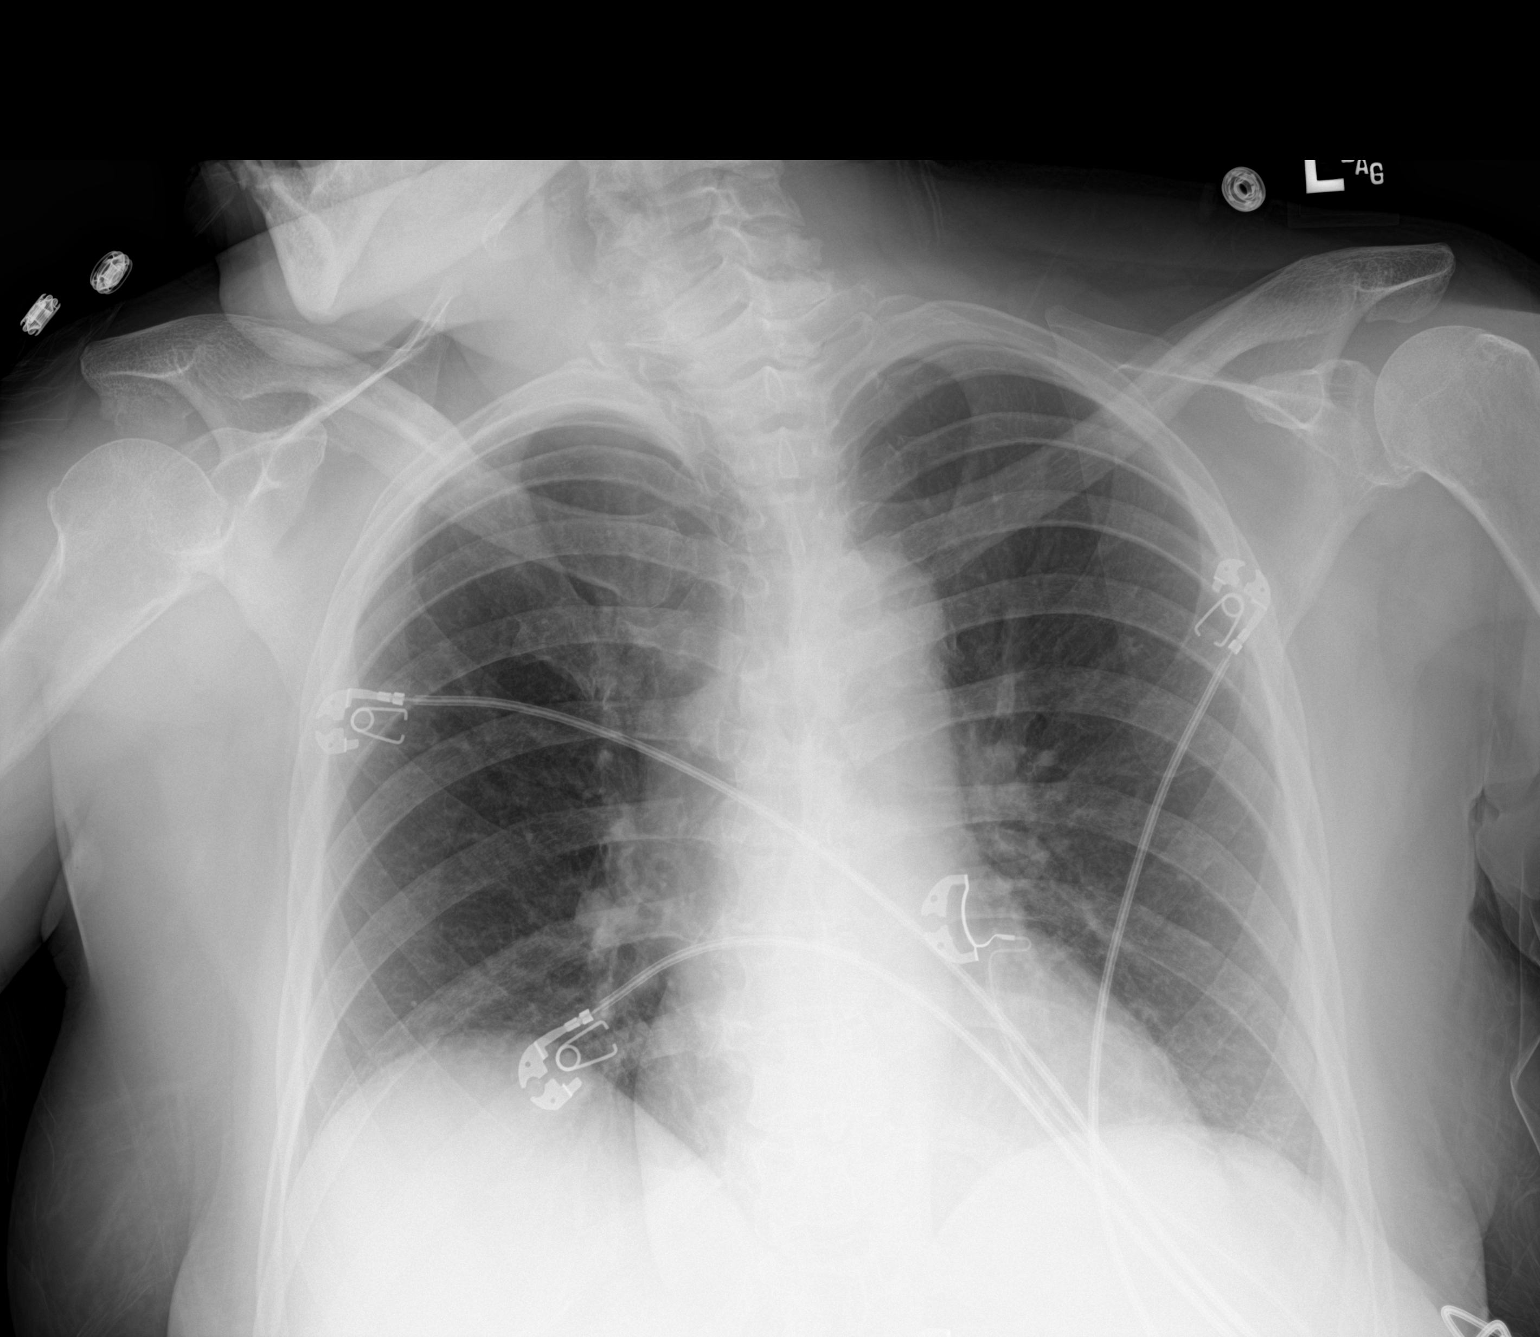

[1 of 1 positions shown; findings below may reference images not displayed]

FINDINGS: Cardiac shadows within normal limits. The lungs are well aerated
bilaterally. No focal infiltrate or sizable effusion is seen. No
bony abnormality is noted.
IMPRESSION: No acute abnormality noted.

## 2020-04-02 ENCOUNTER — Other Ambulatory Visit: Payer: Self-pay

## 2020-04-02 ENCOUNTER — Encounter (HOSPITAL_COMMUNITY): Payer: Self-pay | Admitting: Emergency Medicine

## 2020-04-02 ENCOUNTER — Emergency Department (HOSPITAL_COMMUNITY)
Admission: EM | Admit: 2020-04-02 | Discharge: 2020-04-03 | Disposition: A | Discharge status: Home / Self Care | Payer: Self-pay | Attending: Emergency Medicine | Admitting: Emergency Medicine

## 2020-04-02 DIAGNOSIS — Z5900 Homelessness unspecified: Secondary | ICD-10-CM | POA: Insufficient documentation

## 2020-04-02 DIAGNOSIS — F17210 Nicotine dependence, cigarettes, uncomplicated: Secondary | ICD-10-CM | POA: Insufficient documentation

## 2020-04-02 DIAGNOSIS — M79672 Pain in left foot: Secondary | ICD-10-CM

## 2020-04-02 DIAGNOSIS — M79671 Pain in right foot: Secondary | ICD-10-CM | POA: Insufficient documentation
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
## 2020-04-03 ENCOUNTER — Emergency Department (HOSPITAL_COMMUNITY)
Admission: EM | Admit: 2020-04-03 | Discharge: 2020-04-04 | Disposition: A | Discharge status: Home / Self Care | Payer: Self-pay | Attending: Emergency Medicine | Admitting: Emergency Medicine

## 2020-04-03 ENCOUNTER — Other Ambulatory Visit: Payer: Self-pay

## 2020-04-03 ENCOUNTER — Encounter (HOSPITAL_COMMUNITY): Payer: Self-pay | Admitting: Emergency Medicine

## 2020-04-03 DIAGNOSIS — F17210 Nicotine dependence, cigarettes, uncomplicated: Secondary | ICD-10-CM | POA: Insufficient documentation

## 2020-04-03 DIAGNOSIS — M79672 Pain in left foot: Secondary | ICD-10-CM | POA: Insufficient documentation

## 2020-04-03 DIAGNOSIS — Z765 Malingerer [conscious simulation]: Secondary | ICD-10-CM | POA: Insufficient documentation

## 2020-04-03 DIAGNOSIS — M79671 Pain in right foot: Secondary | ICD-10-CM | POA: Insufficient documentation

## 2020-04-03 MED ORDER — DICLOFENAC SODIUM 1 % EX GEL: 4.0000 g | Freq: Four times a day (QID) | CUTANEOUS | 0 refills | Status: DC

## 2020-04-03 NOTE — Discharge Planning (Signed)
RNCM consulted regarding homeless pt needing resources for shelter and transportation.  RNCM met with pt and informed her about the [facility]) and the services they provide.  RNCM provided bus passes to utilize for transportation at discharge.

## 2020-04-03 NOTE — ED Notes (Signed)
Pt provided "ED Happy meal"

## 2020-04-03 NOTE — ED Triage Notes (Signed)
Patient requesting medication refill but can not recall names of medications " it's in my record". Alert and oriented , respirations unlabored/denies pain .

## 2020-04-03 NOTE — ED Notes (Signed)
Case management at bedside.
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
## 2020-04-03 NOTE — Discharge Instructions (Addendum)
Please follow-up with your primary care doctor. Please take Tylenol as needed for pain [phone] mg every 6 hours.  I have also represcribed your Voltaren gel which you may place on your feet recently as needed for pain.Marland Kitchen  Ultimately will need to keep your feet and legs warm however to prevent worsening pain.

## 2020-04-03 NOTE — ED Notes (Signed)
Called patient for a vital update patient didn't answer 

## 2020-04-04 ENCOUNTER — Other Ambulatory Visit: Payer: Self-pay

## 2020-04-04 ENCOUNTER — Encounter (HOSPITAL_BASED_OUTPATIENT_CLINIC_OR_DEPARTMENT_OTHER): Payer: Self-pay | Admitting: *Deleted

## 2020-04-04 DIAGNOSIS — R059 Cough, unspecified: Secondary | ICD-10-CM | POA: Diagnosis present

## 2020-04-04 DIAGNOSIS — U071 COVID-19: Secondary | ICD-10-CM | POA: Diagnosis not present

## 2020-04-04 DIAGNOSIS — F17210 Nicotine dependence, cigarettes, uncomplicated: Secondary | ICD-10-CM | POA: Diagnosis not present

## 2020-04-04 LAB — COMPREHENSIVE METABOLIC PANEL
ALT: 13 U/L (ref 0–44)
AST: 15 U/L (ref 15–41)
Albumin: 4.2 g/dL (ref 3.5–5.0)
Alkaline Phosphatase: 71 U/L (ref 38–126)
Anion gap: 11 (ref 5–15)
BUN: 11 mg/dL (ref 8–23)
CO2: 23 mmol/L (ref 22–32)
Calcium: 9.3 mg/dL (ref 8.9–10.3)
Chloride: 102 mmol/L (ref 98–111)
Creatinine, Ser: 0.68 mg/dL (ref 0.44–1.00)
GFR, Estimated: >60 mL/min (ref >60)
Glucose, Bld: 149 mg/dL — ABNORMAL HIGH (ref 70–99)
Potassium: 3.3 mmol/L — ABNORMAL LOW (ref 3.5–5.1)
Sodium: 136 mmol/L (ref 135–145)
Total Bilirubin: 0.5 mg/dL (ref 0.3–1.2)
Total Protein: 7.8 g/dL (ref 6.5–8.1)

## 2020-04-04 LAB — LIPASE, BLOOD: Lipase: 31 U/L (ref 11–51)

## 2020-04-04 LAB — URINALYSIS, MICROSCOPIC (REFLEX): RBC / HPF: NONE SEEN RBC/hpf (ref 0–5)

## 2020-04-04 LAB — URINALYSIS, ROUTINE W REFLEX MICROSCOPIC
Bilirubin Urine: NEGATIVE
Glucose, UA: NEGATIVE mg/dL
Hgb urine dipstick: NEGATIVE
Ketones, ur: NEGATIVE mg/dL
Nitrite: NEGATIVE
Protein, ur: 30 mg/dL — AB
Specific Gravity, Urine: 1.015 (ref 1.005–1.030)
pH: 6 (ref 5.0–8.0)

## 2020-04-04 LAB — CBC
HCT: 37.9 % (ref 36.0–46.0)
Hemoglobin: 12.3 g/dL (ref 12.0–15.0)
MCH: 29.6 pg (ref 26.0–34.0)
MCHC: 32.5 g/dL (ref 30.0–36.0)
MCV: 91.1 fL (ref 80.0–100.0)
Platelets: 276 10*3/uL (ref 150–400)
RBC: 4.16 MIL/uL (ref 3.87–5.11)
RDW: 14.2 % (ref 11.5–15.5)
WBC: 5.2 10*3/uL (ref 4.0–10.5)
nRBC: 0 % (ref 0.0–0.2)

## 2020-04-04 MED ORDER — ACETAMINOPHEN 325 MG PO TABS
650.0000 mg | ORAL_TABLET | Freq: Once | ORAL | Status: AC
  Administered 2020-04-04: 650 mg via ORAL
  Filled 2020-04-04: qty 2

## 2020-04-04 NOTE — ED Triage Notes (Signed)
To ER via EMS c.o "feeling awful". EMS gave her 975mg  of Tylenol prior to arrival. C/o abdominal pain.

## 2020-04-04 NOTE — Discharge Instructions (Addendum)
Please fill the prescriptions that were written for you yesterday.  Please follow-up with your primary care doctor at the [facility].

## 2020-04-04 NOTE — Discharge Planning (Signed)
RNCM consulted regarding transportation and resources for homeless, uninsured pt.  This RNCM spoke with pt yesterday for same request.  Pt was educated on [facility]) and provided bus passes.  RNCM will re-educate pt and provide bus pass for transportation.

## 2020-04-04 NOTE — ED Notes (Signed)
Pt states she needs medicine for eczema and zantac refill. Pt states she does not have a PCP.

## 2020-04-04 NOTE — ED Provider Notes (Signed)
[facility] EMERGENCY DEPARTMENT Provider Note   CSN: [id number] Arrival date & time: 04/03/20  1845     History Chief Complaint  Patient presents with  . Medication Refill    Betty Henderson is a 60 y.o. female.  HPI Patient is 60 year old female with past medical history detailed below presented today for the second time in 2 days (he has had 6 ER visits in the past 1 month).  She is presenting today with chief complaint "I could not get back to [employer] when I saw her she was discharged with a bus pass however she never left the emergency room and checked back in after being discharged.  She told the triage nurse that she was here for a medication refill however she is primarily, again, complaining of bilateral foot pain.  This was addressed during her last visit.  She is at this point requesting Tylenol.  She has no other complaints at this time.  Denies any other social or medical issues that need addressing today.    Past Medical History:  Diagnosis Date  . Anemia   . Arthritis   . Chronic pain   . Drug-seeking behavior   . Malingering   . Osteopetrosis   . Psychosis (HCC)   . Schizoaffective disorder, bipolar type (HCC)     There are no problems to display for this patient.   Past Surgical History:  Procedure Laterality Date  . MOUTH SURGERY    . TUBAL LIGATION       OB History   No obstetric history on file.     Family History  Family history unknown: Yes    Social History   Tobacco Use  . Smoking status: Current Some Day Smoker    Packs/day: 1.00    Types: Cigarettes  . Smokeless tobacco: Never Used  Vaping Use  . Vaping Use: Never used  Substance Use Topics  . Alcohol use: Yes  . Drug use: Yes    Types: Cocaine    Comment: states "it's legal though"    Home Medications Prior to Admission medications   Medication Sig Start Date End Date Taking? Authorizing Provider  diclofenac Sodium (VOLTAREN) 1 %  GEL Apply 4 g topically 4 (four) times daily. 04/03/20   Gailen Shelter, PA  lidocaine (LIDODERM) 5 % Place 1 patch onto the skin daily. Remove & Discard patch within 12 hours or as directed by MD 01/02/20   Nicanor Alcon, April, MD  ranitidine (ZANTAC) 150 MG capsule Take 1 capsule (150 mg total) by mouth daily. Patient not taking: Reported on 03/20/2018 05/31/17 01/07/19  Lorre Nick, MD    Allergies    Lemon oil, Strawberry extract, Other, Adhesive  [tape], Cherry, and Proanthocyanidin  Review of Systems   Review of Systems  Constitutional: Negative for fever.  HENT: Negative for congestion.   Respiratory: Negative for shortness of breath.   Cardiovascular: Negative for chest pain.  Gastrointestinal: Negative for abdominal distention.  Musculoskeletal:       Bilateral foot pain  Neurological: Negative for dizziness and headaches.    Physical Exam Updated Vital Signs BP (!) 136/59   Pulse 88   Temp 99.2 F (37.3 C) (Oral)   Resp 16   Ht 5\' 6"  (1.676 m)   Wt 92 kg   SpO2 95%   BMI 32.74 kg/m   Physical Exam Vitals and nursing note reviewed.  Constitutional:      General: She is not in  acute distress.    Appearance: Normal appearance. She is not ill-appearing.  HENT:     Head: Normocephalic and atraumatic.     Mouth/Throat:     Mouth: Mucous membranes are moist.  Eyes:     General: No scleral icterus.       Right eye: No discharge.        Left eye: No discharge.     Conjunctiva/sclera: Conjunctivae normal.  Pulmonary:     Effort: Pulmonary effort is normal.     Breath sounds: No stridor.  Neurological:     Mental Status: She is alert and oriented to person, place, and time. Mental status is at baseline.     Comments: Normal gait     ED Results / Procedures / Treatments   Labs (all labs ordered are listed, but only abnormal results are displayed) Labs Reviewed - No data to display  EKG None  Radiology No results found.  Procedures Procedures (including  critical care time)  Medications Ordered in ED Medications  acetaminophen (TYLENOL) tablet 650 mg (650 mg Oral Given 04/04/20 1751)    ED Course  I have reviewed the triage vital signs and the nursing notes.  Pertinent labs & imaging results that were available during my care of the patient were reviewed by me and considered in my medical decision making (see chart for details).    MDM Rules/Calculators/A&P                          Patient 60 year old female seen by myself yesterday for foot pain full exam was done at that time.  She presents today having not left the emergency department and having checked back into ER after being discharged yesterday.  She states she does not have a place to go.  I discussed with TOC who provided patient with another bus pass and information about [facility].  This is where she has been seen in the past.  She will be discharged to [facility].  Final Clinical Impression(s) / ED Diagnoses Final diagnoses:  Malingering    Rx / DC Orders ED Discharge Orders    None       Gailen Shelter, [place] 04/04/20 1011    Lorre Nick, MD 04/07/20 [phone]

## 2020-04-04 NOTE — ED Notes (Signed)
Unable to urinate

## 2020-04-05 ENCOUNTER — Emergency Department (HOSPITAL_BASED_OUTPATIENT_CLINIC_OR_DEPARTMENT_OTHER)
Admission: EM | Admit: 2020-04-05 | Discharge: 2020-04-05 | Disposition: A | Discharge status: Home / Self Care | Payer: HRSA Program | Attending: Emergency Medicine | Admitting: Emergency Medicine

## 2020-04-05 ENCOUNTER — Encounter (HOSPITAL_BASED_OUTPATIENT_CLINIC_OR_DEPARTMENT_OTHER): Payer: Self-pay | Admitting: Emergency Medicine

## 2020-04-05 DIAGNOSIS — Z20822 Contact with and (suspected) exposure to covid-19: Secondary | ICD-10-CM

## 2020-04-05 LAB — SARS CORONAVIRUS 2 (TAT 6-24 HRS): SARS Coronavirus 2: POSITIVE — AB

## 2020-04-05 MED ORDER — FOSFOMYCIN TROMETHAMINE 3 G PO PACK
3.0000 g | PACK | Freq: Once | ORAL | Status: AC
  Administered 2020-04-05: 3 g via ORAL
  Filled 2020-04-05: qty 3

## 2020-04-05 NOTE — Discharge Instructions (Addendum)
Person Under Monitoring Name: Betty Henderson  Location: [address] [place] [address]   Infection Prevention Recommendations for Individuals Confirmed to have, or Being Evaluated for, 2019 Novel Coronavirus (COVID-19) Infection Who Receive Care at Home  Individuals who are confirmed to have, or are being evaluated for, COVID-19 should follow the prevention steps below until a healthcare provider or local or state health department says they can return to normal activities.  Stay home except to get medical care You should restrict activities outside your home, except for getting medical care. Do not go to work, school, or public areas, and do not use public transportation or taxis.  Call ahead before visiting your doctor Before your medical appointment, call the healthcare provider and tell them that you have, or are being evaluated for, COVID-19 infection. This will help the healthcare provider's office take steps to keep other people from getting infected. Ask your healthcare provider to call the local or state health department.  Monitor your symptoms Seek prompt medical attention if your illness is worsening (e.g., difficulty breathing). Before going to your medical appointment, call the healthcare provider and tell them that you have, or are being evaluated for, COVID-19 infection. Ask your healthcare provider to call the local or state health department.  Wear a facemask You should wear a facemask that covers your nose and mouth when you are in the same room with other people and when you visit a healthcare provider. People who live with or visit you should also wear a facemask while they are in the same room with you.  Separate yourself from other people in your home As much as possible, you should stay in a different room from other people in your home. Also, you should use a separate bathroom, if available.  Avoid sharing household items You should not  share dishes, drinking glasses, cups, eating utensils, towels, bedding, or other items with other people in your home. After using these items, you should wash them thoroughly with soap and water.  Cover your coughs and sneezes Cover your mouth and nose with a tissue when you cough or sneeze, or you can cough or sneeze into your sleeve. Throw used tissues in a lined trash can, and immediately wash your hands with soap and water for at least 20 seconds or use an alcohol-based hand rub.  Wash your [employer] your hands often and thoroughly with soap and water for at least 20 seconds. You can use an alcohol-based hand sanitizer if soap and water are not available and if your hands are not visibly dirty. Avoid touching your eyes, nose, and mouth with unwashed hands.   Prevention Steps for Caregivers and Household Members of Individuals Confirmed to have, or Being Evaluated for, COVID-19 Infection Being Cared for in the Home  If you live with, or provide care at home for, a person confirmed to have, or being evaluated for, COVID-19 infection please follow these guidelines to prevent infection:  Follow healthcare provider's instructions Make sure that you understand and can help the patient follow any healthcare provider instructions for all care.  Provide for the patient's basic needs You should help the patient with basic needs in the home and provide support for getting groceries, prescriptions, and other personal needs.  Monitor the patient's symptoms If they are getting sicker, call his or her medical provider and tell them that the patient has, or is being evaluated for, COVID-19 infection. This will help the healthcare provider's  office take steps to keep other people from getting infected. Ask the healthcare provider to call the local or state health department.  Limit the number of people who have contact with the patient If possible, have only one caregiver for the  patient. Other household members should stay in another home or place of residence. If this is not possible, they should stay in another room, or be separated from the patient as much as possible. Use a separate bathroom, if available. Restrict visitors who do not have an essential need to be in the home.  Keep older adults, very young children, and other sick people away from the patient Keep older adults, very young children, and those who have compromised immune systems or chronic health conditions away from the patient. This includes people with chronic heart, lung, or kidney conditions, diabetes, and cancer.  Ensure good ventilation Make sure that shared spaces in the home have good air flow, such as from an air conditioner or an opened window, weather permitting.  Wash your hands often Wash your hands often and thoroughly with soap and water for at least 20 seconds. You can use an alcohol based hand sanitizer if soap and water are not available and if your hands are not visibly dirty. Avoid touching your eyes, nose, and mouth with unwashed hands. Use disposable paper towels to dry your hands. If not available, use dedicated cloth towels and replace them when they become wet.  Wear a facemask and gloves Wear a disposable facemask at all times in the room and gloves when you touch or have contact with the patient's blood, body fluids, and/or secretions or excretions, such as sweat, saliva, sputum, nasal mucus, vomit, urine, or feces.  Ensure the mask fits over your nose and mouth tightly, and do not touch it during use. Throw out disposable facemasks and gloves after using them. Do not reuse. Wash your hands immediately after removing your facemask and gloves. If your personal clothing becomes contaminated, carefully remove clothing and launder. Wash your hands after handling contaminated clothing. Place all used disposable facemasks, gloves, and other waste in a lined container before  disposing them with other household waste. Remove gloves and wash your hands immediately after handling these items.  Do not share dishes, glasses, or other household items with the patient Avoid sharing household items. You should not share dishes, drinking glasses, cups, eating utensils, towels, bedding, or other items with a patient who is confirmed to have, or being evaluated for, COVID-19 infection. After the person uses these items, you should wash them thoroughly with soap and water.  Wash laundry thoroughly Immediately remove and wash clothes or bedding that have blood, body fluids, and/or secretions or excretions, such as sweat, saliva, sputum, nasal mucus, vomit, urine, or feces, on them. Wear gloves when handling laundry from the patient. Read and follow directions on labels of laundry or clothing items and detergent. In general, wash and dry with the warmest temperatures recommended on the label.  Clean all areas the individual has used often Clean all touchable surfaces, such as counters, tabletops, doorknobs, bathroom fixtures, toilets, phones, keyboards, tablets, and bedside tables, every day. Also, clean any surfaces that may have blood, body fluids, and/or secretions or excretions on them. Wear gloves when cleaning surfaces the patient has come in contact with. Use a diluted bleach solution (e.g., dilute bleach with 1 part bleach and 10 parts water) or a household disinfectant with a label that says EPA-registered for coronaviruses. To make a  bleach solution at home, add 1 tablespoon of bleach to 1 quart (4 cups) of water. For a larger supply, add  cup of bleach to 1 gallon (16 cups) of water. Read labels of cleaning products and follow recommendations provided on product labels. Labels contain instructions for safe and effective use of the cleaning product including precautions you should take when applying the product, such as wearing gloves or eye protection and making sure you  have good ventilation during use of the product. Remove gloves and wash hands immediately after cleaning.  Monitor yourself for signs and symptoms of illness Caregivers and household members are considered close contacts, should monitor their health, and will be asked to limit movement outside of the home to the extent possible. Follow the monitoring steps for close contacts listed on the symptom monitoring form.   ? If you have additional questions, contact your local health department or call the epidemiologist on call at [phone] (available 24/7). ? This guidance is subject to change. For the most up-to-date guidance from [facility], please refer to their website: [email or web address]

## 2020-04-05 NOTE — ED Notes (Signed)
Pt sleeping in lobby at this time

## 2020-04-05 NOTE — ED Provider Notes (Signed)
[facility] EMERGENCY DEPARTMENT Provider Note   CSN: [id number] Arrival date & time: 04/04/20  1715     History Chief Complaint  Patient presents with  . Cough    Betty Henderson is a 60 y.o. female.  The history is provided by the patient.  Cough Cough characteristics:  Non-productive Severity:  Moderate Onset quality:  Gradual Timing:  Sporadic Progression:  Unchanged Chronicity:  New Smoker: no   Context: sick contacts   Context: not animal exposure   Relieved by:  Nothing Worsened by:  Nothing Ineffective treatments:  None tried Associated symptoms: myalgias and sinus congestion   Associated symptoms: no chest pain, no fever, no rhinorrhea and no shortness of breath   Seen multiple times.  Now complaining of body aches and cough.       Past Medical History:  Diagnosis Date  . Anemia   . Arthritis   . Chronic pain   . Drug-seeking behavior   . Malingering   . Osteopetrosis   . Psychosis (HCC)   . Schizoaffective disorder, bipolar type (HCC)     There are no problems to display for this patient.   Past Surgical History:  Procedure Laterality Date  . MOUTH SURGERY    . TUBAL LIGATION       OB History   No obstetric history on file.     Family History  Family history unknown: Yes    Social History   Tobacco Use  . Smoking status: Current Some Day Smoker    Packs/day: 1.00    Types: Cigarettes  . Smokeless tobacco: Never Used  Vaping Use  . Vaping Use: Never used  Substance Use Topics  . Alcohol use: Yes  . Drug use: Yes    Types: Cocaine    Comment: states "it's legal though"    Home Medications Prior to Admission medications   Medication Sig Start Date End Date Taking? Authorizing Provider  diclofenac Sodium (VOLTAREN) 1 % GEL Apply 4 g topically 4 (four) times daily. 04/03/20   Gailen Shelter, PA  lidocaine (LIDODERM) 5 % Place 1 patch onto the skin daily. Remove & Discard patch within 12 hours or as directed by Betty Henderson  01/02/20   Nicanor Alcon, Adamarys Shall, Betty Henderson  ranitidine (ZANTAC) 150 MG capsule Take 1 capsule (150 mg total) by mouth daily. Patient not taking: Reported on 03/20/2018 05/31/17 01/07/19  Lorre Nick, Betty Henderson    Allergies    Lemon oil, Strawberry extract, Other, Adhesive  [tape], Cherry, and Proanthocyanidin  Review of Systems   Review of Systems  Constitutional: Negative for fever.  HENT: Negative for rhinorrhea.   Eyes: Negative for visual disturbance.  Respiratory: Positive for cough. Negative for shortness of breath.   Cardiovascular: Negative for chest pain.  Gastrointestinal: Negative for abdominal distention.  Genitourinary: Negative for difficulty urinating.  Musculoskeletal: Positive for myalgias.  Neurological: Negative for dizziness.  Psychiatric/Behavioral: Negative for agitation.  All other systems reviewed and are negative.   Physical Exam Updated Vital Signs BP 140/80 (BP Location: Left Arm)   Pulse 99   Temp 98.1 F (36.7 C) (Oral)   Resp 16   Ht 5\' 6"  (1.676 m)   Wt 92 kg   SpO2 98%   BMI 32.74 kg/m   Physical Exam Vitals and nursing note reviewed.  Constitutional:      General: She is not in acute distress.    Appearance: Normal appearance.  HENT:     Head: Normocephalic and atraumatic.  Nose: Nose normal.  Eyes:     Conjunctiva/sclera: Conjunctivae normal.     Pupils: Pupils are equal, round, and reactive to light.  Cardiovascular:     Rate and Rhythm: Normal rate and regular rhythm.     Pulses: Normal pulses.     Heart sounds: Normal heart sounds.  Pulmonary:     Effort: Pulmonary effort is normal.     Breath sounds: Normal breath sounds.  Abdominal:     General: Abdomen is flat. Bowel sounds are normal.     Palpations: Abdomen is soft. There is no mass.     Tenderness: There is no abdominal tenderness. There is no guarding.  Musculoskeletal:        General: Normal range of motion.     Cervical back: Normal range of motion. No rigidity.  Skin:     General: Skin is warm and dry.     Capillary Refill: Capillary refill takes less than 2 seconds.  Neurological:     General: No focal deficit present.     Mental Status: She is alert and oriented to person, place, and time.  Psychiatric:        Mood and Affect: Mood normal.        Behavior: Behavior normal.     ED Results / Procedures / Treatments   Labs (all labs ordered are listed, but only abnormal results are displayed) Results for orders placed or performed during the hospital encounter of 04/05/20  Lipase, blood  Result Value Ref Range   Lipase 31 11 - 51 U/L  Comprehensive metabolic panel  Result Value Ref Range   Sodium 136 135 - 145 mmol/L   Potassium 3.3 (L) 3.5 - 5.1 mmol/L   Chloride 102 98 - 111 mmol/L   CO2 23 22 - 32 mmol/L   Glucose, Bld 149 (H) 70 - 99 mg/dL   BUN 11 8 - 23 mg/dL   Creatinine, Ser 7.84 0.44 - 1.00 mg/dL   Calcium 9.3 8.9 - 69.6 mg/dL   Total Protein 7.8 6.5 - 8.1 g/dL   Albumin 4.2 3.5 - 5.0 g/dL   AST 15 15 - 41 U/L   ALT 13 0 - 44 U/L   Alkaline Phosphatase 71 38 - 126 U/L   Total Bilirubin 0.5 0.3 - 1.2 mg/dL   GFR, Estimated >29 >52 mL/min   Anion gap 11 5 - 15  CBC  Result Value Ref Range   WBC 5.2 4.0 - 10.5 K/uL   RBC 4.16 3.87 - 5.11 MIL/uL   Hemoglobin 12.3 12.0 - 15.0 g/dL   HCT 84.1 32.4 - 40.1 %   MCV 91.1 80.0 - 100.0 fL   MCH 29.6 26.0 - 34.0 pg   MCHC 32.5 30.0 - 36.0 g/dL   RDW 02.7 25.3 - 66.4 %   Platelets 276 150 - 400 K/uL   nRBC 0.0 0.0 - 0.2 %  Urinalysis, Routine w reflex microscopic Urine, Clean Catch  Result Value Ref Range   Color, Urine YELLOW YELLOW   APPearance HAZY (A) CLEAR   Specific Gravity, Urine 1.015 1.005 - 1.030   pH 6.0 5.0 - 8.0   Glucose, UA NEGATIVE NEGATIVE mg/dL   Hgb urine dipstick NEGATIVE NEGATIVE   Bilirubin Urine NEGATIVE NEGATIVE   Ketones, ur NEGATIVE NEGATIVE mg/dL   Protein, ur 30 (A) NEGATIVE mg/dL   Nitrite NEGATIVE NEGATIVE   Leukocytes,Ua MODERATE (A) NEGATIVE   Urinalysis, Microscopic (reflex)  Result Value Ref Range   RBC /  HPF NONE SEEN 0 - 5 RBC/hpf   WBC, UA 11-20 0 - 5 WBC/hpf   Bacteria, UA FEW (A) NONE SEEN   Squamous Epithelial / LPF 6-10 0 - 5   No results found.   Radiology No results found.  Procedures Procedures (including critical care time)  Medications Ordered in ED Medications  fosfomycin (MONUROL) packet 3 g (has no administration in time range)    ED Course  I have reviewed the triage vital signs and the nursing notes.  Pertinent labs & imaging results that were available during my care of the patient were reviewed by me and considered in my medical decision making (see chart for details).    Urine from previous visit treated in the ED.  Given number of ED visits likely was exposed to covid in the ED waiting room.  covid swab sent and patient placed in quarantine.  Well appearing and stable for discharge with close follow up.    Betty Henderson was evaluated in Emergency Department on 04/05/2020 for the symptoms described in the history of present illness. She was evaluated in the context of the global COVID-19 pandemic, which necessitated consideration that the patient might be at risk for infection with the SARS-CoV-2 virus that causes COVID-19. Institutional protocols and algorithms that pertain to the evaluation of patients at risk for COVID-19 are in a state of rapid change based on information released by regulatory bodies including the CDC and federal and state organizations. These policies and algorithms were followed during the patient's care in the ED.  Final Clinical Impression(s) / ED Diagnoses Return for intractable cough, coughing up blood,fevers >100.4 unrelieved by medication, shortness of breath, intractable vomiting, chest pain, shortness of breath, weakness,numbness, changes in speech, facial asymmetry,abdominal pain, passing out,Inability to tolerate liquids or food, cough, altered mental  status or any concerns. No signs of systemic illness or infection. The patient is nontoxic-appearing on exam and vital signs are within normal limits.   I have reviewed the triage vital signs and the nursing notes. Pertinent labs &imaging results that were available during my care of the patient were reviewed by me and considered in my medical decision making (see chart for details).After history, exam, and medical workup I feel the patient has beenappropriately medically screened and is safe for discharge home. Pertinent diagnoses were discussed with the patient. Patient was given return precautions.      Betty Wiersma, Betty Henderson 04/05/20 [phone]

## 2020-04-06 ENCOUNTER — Other Ambulatory Visit: Payer: Self-pay

## 2020-04-06 ENCOUNTER — Encounter (HOSPITAL_BASED_OUTPATIENT_CLINIC_OR_DEPARTMENT_OTHER): Payer: Self-pay | Admitting: *Deleted

## 2020-04-06 ENCOUNTER — Emergency Department (HOSPITAL_BASED_OUTPATIENT_CLINIC_OR_DEPARTMENT_OTHER)
Admission: EM | Admit: 2020-04-06 | Discharge: 2020-04-06 | Disposition: A | Discharge status: Home / Self Care | Payer: Self-pay | Attending: Emergency Medicine | Admitting: Emergency Medicine

## 2020-04-06 DIAGNOSIS — M79605 Pain in left leg: Secondary | ICD-10-CM

## 2020-04-06 DIAGNOSIS — R0981 Nasal congestion: Secondary | ICD-10-CM | POA: Insufficient documentation

## 2020-04-06 DIAGNOSIS — Z5900 Homelessness unspecified: Secondary | ICD-10-CM | POA: Insufficient documentation

## 2020-04-06 DIAGNOSIS — F17210 Nicotine dependence, cigarettes, uncomplicated: Secondary | ICD-10-CM | POA: Insufficient documentation

## 2020-04-06 DIAGNOSIS — R059 Cough, unspecified: Secondary | ICD-10-CM | POA: Insufficient documentation

## 2020-04-06 DIAGNOSIS — G8929 Other chronic pain: Secondary | ICD-10-CM | POA: Insufficient documentation

## 2020-04-06 DIAGNOSIS — M79652 Pain in left thigh: Secondary | ICD-10-CM | POA: Insufficient documentation

## 2020-04-06 MED ORDER — NAPROXEN 250 MG PO TABS
500.0000 mg | ORAL_TABLET | Freq: Once | ORAL | Status: AC
  Administered 2020-04-06: 500 mg via ORAL
  Filled 2020-04-06: qty 2

## 2020-04-06 NOTE — ED Triage Notes (Signed)
brought in by EMS c/o leg pain
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
## 2020-04-07 ENCOUNTER — Other Ambulatory Visit: Payer: Self-pay

## 2020-04-07 ENCOUNTER — Emergency Department (HOSPITAL_BASED_OUTPATIENT_CLINIC_OR_DEPARTMENT_OTHER)
Admission: EM | Admit: 2020-04-07 | Discharge: 2020-04-07 | Disposition: A | Discharge status: Home / Self Care | Payer: Self-pay | Attending: Emergency Medicine | Admitting: Emergency Medicine

## 2020-04-07 ENCOUNTER — Encounter (HOSPITAL_BASED_OUTPATIENT_CLINIC_OR_DEPARTMENT_OTHER): Payer: Self-pay | Admitting: *Deleted

## 2020-04-07 DIAGNOSIS — U071 COVID-19: Secondary | ICD-10-CM | POA: Insufficient documentation

## 2020-04-07 DIAGNOSIS — Z5900 Homelessness unspecified: Secondary | ICD-10-CM | POA: Insufficient documentation

## 2020-04-07 DIAGNOSIS — F17210 Nicotine dependence, cigarettes, uncomplicated: Secondary | ICD-10-CM | POA: Insufficient documentation

## 2020-04-07 NOTE — ED Triage Notes (Signed)
Pt brought in by EMS , pt is homeless , seen at [facility] ed 2 times today and [facility] ED yesterday for same , pt is covid +

## 2020-04-07 NOTE — Discharge Instructions (Addendum)
Take Tylenol as needed to help with your body aches associated with your COVID infection. Return to the ER for start to experience chest pain, shortness of breath, uncontrollable vomiting

## 2020-04-07 NOTE — ED Notes (Signed)
Per ED PA and MD, pt was able to be DC from ED, pt was provided a place to rest, warm blankets provided, sm snack also given to pt, a "to go bag" was provided with toothbrush, toothpaste, liquid wash, snacks and Gatorade was provided. Pt was prepared crackers and cheese and Hot Chocolate. Security aware of pt in waiting area.

## 2020-04-07 NOTE — ED Notes (Signed)
In to room to round on client earlier, pt was sleeping and would not answer RN questions, in to round again on pt, opens eyes when name called, asked what brings her to the ED , she mumbled to what appears to be that she was cold and wanted warm blankets on her legs. Blankets provided, when questioned if having any pain or discomfort, she stated no.

## 2020-04-07 NOTE — ED Provider Notes (Signed)
[facility] EMERGENCY DEPARTMENT Provider Note   CSN: [id number] Arrival date & time: 04/07/20  1746     History Chief Complaint  Patient presents with  . Homeless    Betty Henderson is a 60 y.o. female with a past medical history of schizoaffective disorder, anemia, arthritis, positive COVID test within the past week presenting to the ED requesting a warm blanket.  States that she has been having pain throughout her entire body and feels cold.  Denies any specific chest pain, vomiting or diarrhea.  Remainder of history is limited as patient states that she would like to sleep and does not want to talk to anyone.  HPI     Past Medical History:  Diagnosis Date  . Anemia   . Arthritis   . Chronic pain   . Drug-seeking behavior   . Malingering   . Osteopetrosis   . Psychosis (HCC)   . Schizoaffective disorder, bipolar type (HCC)     There are no problems to display for this patient.   Past Surgical History:  Procedure Laterality Date  . MOUTH SURGERY    . TUBAL LIGATION       OB History   No obstetric history on file.     Family History  Family history unknown: Yes    Social History   Tobacco Use  . Smoking status: Current Some Day Smoker    Packs/day: 1.00    Types: Cigarettes  . Smokeless tobacco: Never Used  Vaping Use  . Vaping Use: Never used  Substance Use Topics  . Alcohol use: Yes  . Drug use: Yes    Types: Cocaine    Comment: states "it's legal though"    Home Medications Prior to Admission medications   Medication Sig Start Date End Date Taking? Authorizing Provider  diclofenac Sodium (VOLTAREN) 1 % GEL Apply 4 g topically 4 (four) times daily. 04/03/20   Gailen Shelter, PA  lidocaine (LIDODERM) 5 % Place 1 patch onto the skin daily. Remove & Discard patch within 12 hours or as directed by MD 01/02/20   Nicanor Alcon, April, MD  ranitidine (ZANTAC) 150 MG capsule Take 1 capsule (150 mg total) by mouth daily. Patient not taking:  Reported on 03/20/2018 05/31/17 01/07/19  Lorre Nick, MD    Allergies    Lemon oil, Strawberry extract, Other, Adhesive  [tape], Cherry, and Proanthocyanidin  Review of Systems   Review of Systems  Constitutional: Negative for chills and fever.  Gastrointestinal: Negative for abdominal pain and vomiting.  Musculoskeletal: Positive for myalgias.    Physical Exam Updated Vital Signs BP 110/60 (BP Location: Left Arm)   Pulse 71   Temp 98.2 F (36.8 C) (Oral)   Resp 18   SpO2 92%   Physical Exam Vitals and nursing note reviewed.  Constitutional:      General: She is not in acute distress.    Appearance: She is well-developed and well-nourished. She is not diaphoretic.  HENT:     Head: Normocephalic and atraumatic.  Eyes:     General: No scleral icterus.    Extraocular Movements: EOM normal.     Conjunctiva/sclera: Conjunctivae normal.  Cardiovascular:     Rate and Rhythm: Normal rate and regular rhythm.     Heart sounds: Normal heart sounds.  Pulmonary:     Effort: Pulmonary effort is normal. No respiratory distress.  Musculoskeletal:     Cervical back: Normal range of motion.  Skin:    Findings: No rash.  Neurological:     Mental Status: She is alert.  Psychiatric:        Mood and Affect: Mood and affect normal.     ED Results / Procedures / Treatments   Labs (all labs ordered are listed, but only abnormal results are displayed) Labs Reviewed - No data to display  EKG None  Radiology No results found.  Procedures Procedures (including critical care time)  Medications Ordered in ED Medications - No data to display  ED Course  I have reviewed the triage vital signs and the nursing notes.  Pertinent labs & imaging results that were available during my care of the patient were reviewed by me and considered in my medical decision making (see chart for details).    MDM Rules/Calculators/A&P                          Betty Henderson was evaluated  in Emergency Department on 04/07/20 for the symptoms described in the history of present illness. He/she was evaluated in the context of the global COVID-19 pandemic, which necessitated consideration that the patient might be at risk for infection with the SARS-CoV-2 virus that causes COVID-19. Institutional protocols and algorithms that pertain to the evaluation of patients at risk for COVID-19 are in a state of rapid change based on information released by regulatory bodies including the CDC and federal and state organizations. These policies and algorithms were followed during the patient's care in the ED.  60 year old female presenting to the ED requesting blankets.  Patient is homeless and has had several visits in the ED recently.  She was found to be COVID-positive earlier this week.  She denies any other complaints other than having myalgias.  Her vital signs are within normal limits, she is resting comfortably.  Lungs are clear.  We will provide her with food and blankets.  Counseled on over-the-counter medications to help with myalgias.  Return precautions given.   Patient is hemodynamically stable, in NAD, and able to ambulate in the ED. Evaluation does not show pathology that would require ongoing emergent intervention or inpatient treatment. I explained the diagnosis to the patient. Pain has been managed and has no complaints prior to discharge. Patient is comfortable with above plan and is stable for discharge at this time. All questions were answered prior to disposition. Strict return precautions for returning to the ED were discussed. Encouraged follow up with PCP.   An After Visit Summary was printed and given to the patient.   Portions of this note were generated with Scientist, clinical (histocompatibility and immunogenetics). Dictation errors may occur despite best attempts at proofreading.  Final Clinical Impression(s) / ED Diagnoses Final diagnoses:  Homeless single person  COVID-19    Rx / DC Orders ED  Discharge Orders    None       Dietrich Pates, PA-C 04/07/20 2216    Rozelle Logan, DO 04/08/20 0003

## 2020-05-04 ENCOUNTER — Other Ambulatory Visit: Payer: Self-pay

## 2020-05-04 ENCOUNTER — Emergency Department (HOSPITAL_BASED_OUTPATIENT_CLINIC_OR_DEPARTMENT_OTHER)
Admission: EM | Admit: 2020-05-04 | Discharge: 2020-05-04 | Disposition: A | Discharge status: Home / Self Care | Payer: Self-pay | Attending: Emergency Medicine | Admitting: Emergency Medicine

## 2020-05-04 ENCOUNTER — Encounter (HOSPITAL_BASED_OUTPATIENT_CLINIC_OR_DEPARTMENT_OTHER): Payer: Self-pay

## 2020-05-04 DIAGNOSIS — R52 Pain, unspecified: Secondary | ICD-10-CM | POA: Insufficient documentation

## 2020-05-04 DIAGNOSIS — F17210 Nicotine dependence, cigarettes, uncomplicated: Secondary | ICD-10-CM | POA: Insufficient documentation

## 2020-05-04 MED ORDER — ACETAMINOPHEN 325 MG PO TABS
650.0000 mg | ORAL_TABLET | Freq: Once | ORAL | Status: AC
  Administered 2020-05-04: 650 mg via ORAL
  Filled 2020-05-04: qty 2

## 2020-05-04 NOTE — Discharge Instructions (Addendum)
Please see attached list of homeless shelters in the area.  Please follow-up with your primary care doctor for further management of your chronic arthritis pain.  Return to the ER for any new or worsening symptoms.

## 2020-05-04 NOTE — ED Triage Notes (Signed)
[facility] report-pt was picked up in a parking lot-pt is homeless-c/o "pain all over"-pt stated she wanted to be brought to this ED because she could "get a room" and that she had spent time in jail recently due to being charged with trespassing at [facility] pt was verbally aggressive -pt ambulatory to ED WR

## 2020-05-04 NOTE — ED Triage Notes (Signed)
Pt c/o "arthritis-pain all over"-no pain meds PTA-to triage in w/c-NAD

## 2020-05-04 NOTE — ED Notes (Addendum)
Pt states history of back pain and was told she needed braces on her back and legs but "no one ever did it" was seen last month and was prescribed meds but was unable to have them filled due to cost. States wants a shot from the hospital that last months.

## 2020-05-04 NOTE — ED Provider Notes (Signed)
[facility] EMERGENCY DEPARTMENT Provider Note   CSN: [id number] Arrival date & time: 05/04/20  1933     History Chief Complaint  Patient presents with  . Pain    Betty Henderson is a 60 y.o. female.  HPI 60 year old female with a history of chronic pain, drug-seeking behavior, malingering, osteoporosis, schizoaffective disorder, with very frequent ER visits presents to the ER with complaints of pain all over.  Patient was picked up outside of the ER, patient is homeless.  She states that she has chronic arthritis which flares up in the winter.  She has not taken any medicines for this pain because she "did not want to fall asleep".  She states that she just wants to stay in the room to warm up a little bit and then she will be ready to go.  She reports that her husband died a couple years ago, and she has been struggling to find housing.  When offering her a list of shelters, she states that she has been trying to find a roommate and does not need it.  Per triage notes, she was recently arrested for trespassing at [facility].  She denies any fevers, chills, chest pain, shortness of breath, dumping, nausea, vomiting.  She denies any SI/HI.    Past Medical History:  Diagnosis Date  . Anemia   . Arthritis   . Chronic pain   . Drug-seeking behavior   . Malingering   . Osteopetrosis   . Psychosis (HCC)   . Schizoaffective disorder, bipolar type (HCC)     There are no problems to display for this patient.   Past Surgical History:  Procedure Laterality Date  . MOUTH SURGERY    . TUBAL LIGATION       OB History   No obstetric history on file.     Family History  Family history unknown: Yes    Social History   Tobacco Use  . Smoking status: Current Some Day Smoker    Packs/day: 1.00    Types: Cigarettes  . Smokeless tobacco: Never Used  Vaping Use  . Vaping Use: Never used  Substance Use Topics  . Alcohol use: Yes  . Drug use: Yes    Types:  Cocaine    Comment: states "it's legal though"    Home Medications Prior to Admission medications   Medication Sig Start Date End Date Taking? Authorizing Provider  diclofenac Sodium (VOLTAREN) 1 % GEL Apply 4 g topically 4 (four) times daily. 04/03/20   Gailen Shelter, PA  lidocaine (LIDODERM) 5 % Place 1 patch onto the skin daily. Remove & Discard patch within 12 hours or as directed by MD 01/02/20   Nicanor Alcon, April, MD  ranitidine (ZANTAC) 150 MG capsule Take 1 capsule (150 mg total) by mouth daily. Patient not taking: Reported on 03/20/2018 05/31/17 01/07/19  Lorre Nick, MD    Allergies    Lemon oil, Strawberry extract, Other, Adhesive  [tape], Cherry, and Proanthocyanidin  Review of Systems   Review of Systems  Constitutional: Negative for chills and fever.  Musculoskeletal: Positive for arthralgias.  Neurological: Negative for weakness and headaches.    Physical Exam Updated Vital Signs BP 107/68 (BP Location: Right Arm)   Pulse 82   Temp 98.2 F (36.8 C) (Oral)   Resp 18   SpO2 100%   Physical Exam Vitals and nursing note reviewed.  Constitutional:      General: She is not in acute distress.  Appearance: She is well-developed and well-nourished.  HENT:     Head: Normocephalic and atraumatic.  Eyes:     Conjunctiva/sclera: Conjunctivae normal.  Cardiovascular:     Rate and Rhythm: Normal rate and regular rhythm.     Heart sounds: No murmur heard.   Pulmonary:     Effort: Pulmonary effort is normal. No respiratory distress.     Breath sounds: Normal breath sounds.  Abdominal:     Palpations: Abdomen is soft.     Tenderness: There is no abdominal tenderness.  Musculoskeletal:        General: No edema.     Cervical back: Neck supple.     Comments: Moving all 4 extremities without difficulty  Skin:    General: Skin is warm and dry.  Neurological:     General: No focal deficit present.     Mental Status: She is alert and oriented to person, place, and  time.  Psychiatric:        Mood and Affect: Mood and affect and mood normal.        Behavior: Behavior normal.     ED Results / Procedures / Treatments   Labs (all labs ordered are listed, but only abnormal results are displayed) Labs Reviewed - No data to display  EKG None  Radiology No results found.  Procedures Procedures   Medications Ordered in ED Medications  acetaminophen (TYLENOL) tablet 650 mg (has no administration in time range)    ED Course  I have reviewed the triage vital signs and the nursing notes.  Pertinent labs & imaging results that were available during my care of the patient were reviewed by me and considered in my medical decision making (see chart for details).    MDM Rules/Calculators/A&P                          60 year old female who presents with chronic pain.  Has not taken any medicines for this.  Vitals overall reassuring.  Moving all 4 extremes without difficulty.  Patient with a known history of schizoaffective disorder, malingering, drug-seeking behavior.  I did offer her a list of homeless shelters as she states that she wanted to get into her room to get warm.  He states that she does not need a, however nevertheless will provide it.  Overall very well-appearing, sleeping in the ER bed.  Low suspicion for life-threatening infection, septic joints, fractures etc.  Will provide Tylenol, I stressed that she needs to follow-up with [facility] for further management of her arthritis.  Discussed return precautions.  Stable for discharge. Final Clinical Impression(s) / ED Diagnoses Final diagnoses:  Pain    Rx / DC Orders ED Discharge Orders    None       Leone Brand 05/04/20 2039    Pricilla Loveless, MD 05/04/20 2209

## 2020-05-13 ENCOUNTER — Other Ambulatory Visit: Payer: Self-pay

## 2020-05-13 ENCOUNTER — Encounter (HOSPITAL_COMMUNITY): Payer: Self-pay | Admitting: *Deleted

## 2020-05-13 ENCOUNTER — Emergency Department (HOSPITAL_COMMUNITY)
Admission: EM | Admit: 2020-05-13 | Discharge: 2020-05-14 | Disposition: A | Discharge status: Home / Self Care | Payer: Self-pay | Attending: Emergency Medicine | Admitting: Emergency Medicine

## 2020-05-13 DIAGNOSIS — Z5321 Procedure and treatment not carried out due to patient leaving prior to being seen by health care provider: Secondary | ICD-10-CM | POA: Insufficient documentation

## 2020-05-13 DIAGNOSIS — M79605 Pain in left leg: Secondary | ICD-10-CM | POA: Insufficient documentation

## 2020-05-13 DIAGNOSIS — M79604 Pain in right leg: Secondary | ICD-10-CM | POA: Insufficient documentation

## 2020-05-13 NOTE — ED Triage Notes (Signed)
Pt c/o "arthritis pain" in her legs, and urinating a lot.

## 2020-05-14 ENCOUNTER — Other Ambulatory Visit: Payer: Self-pay

## 2020-05-14 ENCOUNTER — Encounter (HOSPITAL_COMMUNITY): Payer: Self-pay | Admitting: Emergency Medicine

## 2020-05-14 ENCOUNTER — Emergency Department (HOSPITAL_COMMUNITY)
Admission: EM | Admit: 2020-05-14 | Discharge: 2020-05-14 | Disposition: A | Discharge status: Home / Self Care | Payer: Self-pay | Attending: Emergency Medicine | Admitting: Emergency Medicine

## 2020-05-14 DIAGNOSIS — M545 Low back pain, unspecified: Secondary | ICD-10-CM | POA: Insufficient documentation

## 2020-05-14 DIAGNOSIS — M542 Cervicalgia: Secondary | ICD-10-CM | POA: Insufficient documentation

## 2020-05-14 DIAGNOSIS — F17210 Nicotine dependence, cigarettes, uncomplicated: Secondary | ICD-10-CM | POA: Insufficient documentation

## 2020-05-14 DIAGNOSIS — R3915 Urgency of urination: Secondary | ICD-10-CM | POA: Insufficient documentation

## 2020-05-14 DIAGNOSIS — M6289 Other specified disorders of muscle: Secondary | ICD-10-CM | POA: Insufficient documentation

## 2020-05-14 DIAGNOSIS — G8929 Other chronic pain: Secondary | ICD-10-CM | POA: Insufficient documentation

## 2020-05-14 LAB — URINALYSIS, ROUTINE W REFLEX MICROSCOPIC
Bacteria, UA: NONE SEEN
Bilirubin Urine: NEGATIVE
Glucose, UA: 50 mg/dL — AB
Ketones, ur: NEGATIVE mg/dL
Leukocytes,Ua: NEGATIVE
Nitrite: NEGATIVE
Protein, ur: NEGATIVE mg/dL
Specific Gravity, Urine: 1.014 (ref 1.005–1.030)
pH: 5 (ref 5.0–8.0)

## 2020-05-14 LAB — CBG MONITORING, ED: Glucose-Capillary: 155 mg/dL — ABNORMAL HIGH (ref 70–99)

## 2020-05-14 MED ORDER — ACETAMINOPHEN 500 MG PO TABS
1000.0000 mg | ORAL_TABLET | Freq: Once | ORAL | Status: AC
  Administered 2020-05-14: 1000 mg via ORAL
  Filled 2020-05-14: qty 2

## 2020-05-14 NOTE — ED Notes (Signed)
Pt called for vitals, didn't answer

## 2020-05-14 NOTE — ED Triage Notes (Signed)
Pt reports arthritis pain to bilateral legs and increased urination.  States she was seen in ED yesterday and LWBS due to wait.

## 2020-05-14 NOTE — ED Provider Notes (Addendum)
[facility] EMERGENCY DEPARTMENT Provider Note   CSN: [id number] Arrival date & time: 05/14/20  1613     History Chief Complaint  Patient presents with  . Leg Pain  . Urinary Frequency    Betty Henderson is a 60 y.o. female presents to the ED for "my legs are stiff".  Points to her thighs.  Also reports pain in her neck.  This has been going on for "a minute".  Denies trauma.  Reports history of the same for a long time.  She used to be on medicines but states she does not have money to get them anymore.  She does not member what these medicines were.  No falls or trauma.  Denies numbness distally, fevers, headaches.  Patient is currently staying at her father's house in [place].  Also reports increased urination but no dysuria, abdominal or flank pain, fever, nausea.  She is requesting food and a cup of coffee.  Of note, chart shows that patient has frequent ED visits for neck pain, cervicalgia, pain in multiple sites, malingering and homelessness.  HPI     Past Medical History:  Diagnosis Date  . Anemia   . Arthritis   . Chronic pain   . Drug-seeking behavior   . Malingering   . Osteopetrosis   . Psychosis (HCC)   . Schizoaffective disorder, bipolar type (HCC)     There are no problems to display for this patient.   Past Surgical History:  Procedure Laterality Date  . MOUTH SURGERY    . TUBAL LIGATION       OB History   No obstetric history on file.     Family History  Family history unknown: Yes    Social History   Tobacco Use  . Smoking status: Current Some Day Smoker    Packs/day: 1.00    Types: Cigarettes  . Smokeless tobacco: Never Used  Vaping Use  . Vaping Use: Never used  Substance Use Topics  . Alcohol use: Yes  . Drug use: Yes    Types: Cocaine    Comment: states "it's legal though"    Home Medications Prior to Admission medications   Medication Sig Start Date End Date Taking? Authorizing Provider  diclofenac  Sodium (VOLTAREN) 1 % GEL Apply 4 g topically 4 (four) times daily. 04/03/20   Gailen Shelter, PA  lidocaine (LIDODERM) 5 % Place 1 patch onto the skin daily. Remove & Discard patch within 12 hours or as directed by MD 01/02/20   Nicanor Alcon, April, MD  ranitidine (ZANTAC) 150 MG capsule Take 1 capsule (150 mg total) by mouth daily. Patient not taking: Reported on 03/20/2018 05/31/17 01/07/19  Lorre Nick, MD    Allergies    Lemon oil, Strawberry extract, Other, Adhesive  [tape], Cherry, and Proanthocyanidin  Review of Systems   Review of Systems  Musculoskeletal: Positive for myalgias (stiffness) and neck pain.  All other systems reviewed and are negative.   Physical Exam Updated Vital Signs BP 131/79 (BP Location: Left Arm)   Pulse 78   Temp 98.2 F (36.8 C) (Oral)   Resp 16   SpO2 100%   Physical Exam Vitals and nursing note reviewed.  Constitutional:      General: She is not in acute distress.    Appearance: She is well-developed and well-nourished.     Comments: NAD.  HENT:     Head: Normocephalic and atraumatic.     Right Ear: External ear normal.  Left Ear: External ear normal.     Nose: Nose normal.  Eyes:     General: No scleral icterus.    Extraocular Movements: EOM normal.     Conjunctiva/sclera: Conjunctivae normal.  Neck:     Comments: No reproducible midline or paraspinal muscular tenderness of the neck parapharyngeal much of the neck with any pain, rigidity. Cardiovascular:     Rate and Rhythm: Normal rate and regular rhythm.     Heart sounds: Normal heart sounds. No murmur heard.     Comments: Number extremity edema.  No calf tenderness.  Normal pulses distally in lower extremities bilaterally. Pulmonary:     Effort: Pulmonary effort is normal.     Breath sounds: Normal breath sounds. No wheezing.  Abdominal:     Palpations: Abdomen is soft.     Tenderness: There is no abdominal tenderness.  Musculoskeletal:        General: No deformity. Normal  range of motion.     Cervical back: Normal range of motion and neck supple.     Comments:  Lumbar: The midline paraspinal muscular tenderness.  Negative SLR bilaterally.  Patient able to lift legs off the bed and hold against gravity.  Lower extremities: Compartments of the upper and lower leg soft, nontender.  Full range of motion of the hip, knees and ankles without any pain.  Skin normal over the legs.  No calf tenderness or edema.  Skin:    General: Skin is warm and dry.     Capillary Refill: Capillary refill takes less than 2 seconds.  Neurological:     Mental Status: She is alert and oriented to person, place, and time.     Comments: Sensation and strength intact in lower extremities.  Patient ambulatory.  Psychiatric:        Mood and Affect: Mood and affect normal.        Behavior: Behavior normal.        Thought Content: Thought content normal.        Judgment: Judgment normal.     ED Results / Procedures / Treatments   Labs (all labs ordered are listed, but only abnormal results are displayed) Labs Reviewed  CBG MONITORING, ED - Abnormal; Notable for the following components:      Result Value   Glucose-Capillary 155 (*)    All other components within normal limits    EKG None  Radiology No results found.  Procedures Procedures   Medications Ordered in ED Medications  acetaminophen (TYLENOL) tablet 1,000 mg (has no administration in time range)    ED Course  I have reviewed the triage vital signs and the nursing notes.  Pertinent labs & imaging results that were available during my care of the patient were reviewed by me and considered in my medical decision making (see chart for details).    MDM Rules/Calculators/A&P                           60 year old female presents to the ED with stiffness of her thighs and acute on chronic neck pain.  No trauma.  History of the same.  Exam is benign.  Suspect MSK etiology of pain.  Consider DVT, infection,  rhabdomyolysis but highly likely given duration of symptoms that are chronic.  Doubt fracture.  Also reports increased urinary frequency but no dysuria.  No red flags like fever, suprapubic or CVA tenderness.  Urinalysis done yesterday without infection, RBCs.  CBG  today is minimally elevated.  No history of diabetes.  I do not think further emergent lab work or imaging is necessary at this time.  Tylenol given.  Recommended follow-up with [facility] Placey/PCP.   Final Clinical Impression(s) / ED Diagnoses Final diagnoses:  Stiff muscles  Chronic neck pain    Rx / DC Orders ED Discharge Orders    None       Liberty Handy, PA-C 05/14/20 2007    Jerrell Mylar 05/14/20 2008    Cheryll Cockayne, MD 05/15/20 [phone]

## 2020-05-14 NOTE — ED Notes (Signed)
Pt called to a room, didn't answer

## 2020-05-14 NOTE — Discharge Instructions (Addendum)
Tylenol or advil as needed for aches

## 2020-05-14 NOTE — ED Notes (Signed)
Pt didn't answer when called for vitals  °

## 2020-05-15 ENCOUNTER — Encounter (HOSPITAL_BASED_OUTPATIENT_CLINIC_OR_DEPARTMENT_OTHER): Payer: Self-pay | Admitting: Emergency Medicine

## 2020-05-15 ENCOUNTER — Other Ambulatory Visit: Payer: Self-pay

## 2020-05-15 ENCOUNTER — Emergency Department (HOSPITAL_BASED_OUTPATIENT_CLINIC_OR_DEPARTMENT_OTHER)
Admission: EM | Admit: 2020-05-15 | Discharge: 2020-05-15 | Disposition: A | Discharge status: Home / Self Care | Payer: Self-pay | Attending: Emergency Medicine | Admitting: Emergency Medicine

## 2020-05-15 DIAGNOSIS — Z5900 Homelessness unspecified: Secondary | ICD-10-CM | POA: Insufficient documentation

## 2020-05-15 DIAGNOSIS — F17210 Nicotine dependence, cigarettes, uncomplicated: Secondary | ICD-10-CM | POA: Insufficient documentation

## 2020-05-15 DIAGNOSIS — G8929 Other chronic pain: Secondary | ICD-10-CM | POA: Insufficient documentation

## 2020-05-15 DIAGNOSIS — M79605 Pain in left leg: Secondary | ICD-10-CM | POA: Insufficient documentation

## 2020-05-15 MED ORDER — NAPROXEN 250 MG PO TABS
500.0000 mg | ORAL_TABLET | Freq: Once | ORAL | Status: AC
  Administered 2020-05-15: 500 mg via ORAL
  Filled 2020-05-15: qty 2

## 2020-05-15 NOTE — ED Provider Notes (Signed)
[facility] Provider Note: Lowella Dell, MD, FACEP  CSN: [id number] MRN: [id number] ARRIVAL: 05/15/20 at 2144 ROOM: MH10/MH10   CHIEF COMPLAINT  Leg Pain   HISTORY OF PRESENT ILLNESS  05/15/20 11:01 PM Betty Henderson is a 60 y.o. female with history of mental illness and frequent visits to the ED was brought from a [employer] by EMS complaining of left leg pain.  She rates her pain is a 10 out of 10.  The pain has been longstanding and located primarily in her left thigh.  It is worse with walking especially when the weather is cold.  She takes bare aspirin for the pain without adequate relief.  She states she has no other acute complaint.   Past Medical History:  Diagnosis Date  . Anemia   . Arthritis   . Chronic pain   . Drug-seeking behavior   . Malingering   . Osteopetrosis   . Psychosis (HCC)   . Schizoaffective disorder, bipolar type [facility])     Past Surgical History:  Procedure Laterality Date  . MOUTH SURGERY    . TUBAL LIGATION      Family History  Family history unknown: Yes    Social History   Tobacco Use  . Smoking status: Current Some Day Smoker    Packs/day: 1.00    Types: Cigarettes  . Smokeless tobacco: Never Used  Vaping Use  . Vaping Use: Never used  Substance Use Topics  . Alcohol use: Yes  . Drug use: Yes    Types: Cocaine    Comment: states "it's legal though"    Prior to Admission medications   Medication Sig Start Date End Date Taking? Authorizing Provider  diclofenac Sodium (VOLTAREN) 1 % GEL Apply 4 g topically 4 (four) times daily. 04/03/20   Gailen Shelter, PA  ranitidine (ZANTAC) 150 MG capsule Take 1 capsule (150 mg total) by mouth daily. Patient not taking: Reported on 03/20/2018 05/31/17 01/07/19  Lorre Nick, MD    Allergies Lemon oil, Strawberry extract, Other, Adhesive  [tape], Cherry, and Proanthocyanidin   REVIEW OF SYSTEMS  Negative except as noted here or in the History of Present  Illness.   PHYSICAL EXAMINATION  Initial Vital Signs Blood pressure 118/77, pulse 81, temperature 98 F (36.7 C), temperature source Oral, resp. rate 20, SpO2 100 %.  Examination General: Well-developed, well-nourished female in no acute distress; appears older than age of record HENT: normocephalic; atraumatic Eyes: Normal appearance Neck: supple Heart: regular rate and rhythm Lungs: clear to auscultation bilaterally Abdomen: soft; nondistended; nontender; bowel sounds present Extremities: No deformity; full range of motion; pulses normal; pain on passive movement of left lower extremity; left lower extremity muscle compartments soft without tenderness Neurologic: Sleeping but readily awakened; motor function intact in all extremities and symmetric; no facial droop Skin: Warm and dry Psychiatric: Normal mood and affect   RESULTS  Summary of this visit's results, reviewed and interpreted by myself:   EKG Interpretation  Date/Time:    Ventricular Rate:    PR Interval:    QRS Duration:   QT Interval:    QTC Calculation:   R Axis:     Text Interpretation:        Laboratory Studies: No results found for this or any previous visit (from the past 24 hour(s)). Imaging Studies: No results found.  ED COURSE and MDM  Nursing notes, initial and subsequent vitals signs, including pulse oximetry, reviewed and interpreted by myself.  Vitals:  05/15/20 2152  BP: 118/77  Pulse: 81  Resp: 20  Temp: 98 F (36.7 C)  TempSrc: Oral  SpO2: 100%   Medications  naproxen (NAPROSYN) tablet 500 mg (has no administration in time range)    The patient's pain is chronic.  She has a history of drug-seeking behavior and I do not believe any narcotic pain medication is indicated.  PROCEDURES  Procedures   ED DIAGNOSES     ICD-10-CM   1. Chronic pain of left lower extremity  M79.605    G89.29   2. Homelessness  Z59.00        Finn Amos, Jonny Ruiz, MD 05/15/20 2307

## 2020-05-15 NOTE — ED Triage Notes (Addendum)
Arrived via [facility] , pricked up from [employer], with left leg pain. Hx of same , gait steady

## 2020-05-19 ENCOUNTER — Encounter (HOSPITAL_COMMUNITY): Payer: Self-pay | Admitting: Emergency Medicine

## 2020-05-19 ENCOUNTER — Emergency Department (HOSPITAL_COMMUNITY)
Admission: EM | Admit: 2020-05-19 | Discharge: 2020-05-19 | Disposition: A | Discharge status: Home / Self Care | Payer: Self-pay | Attending: Emergency Medicine | Admitting: Emergency Medicine

## 2020-05-19 ENCOUNTER — Other Ambulatory Visit: Payer: Self-pay

## 2020-05-19 DIAGNOSIS — F17210 Nicotine dependence, cigarettes, uncomplicated: Secondary | ICD-10-CM | POA: Insufficient documentation

## 2020-05-19 DIAGNOSIS — Z5900 Homelessness unspecified: Secondary | ICD-10-CM | POA: Insufficient documentation

## 2020-05-19 DIAGNOSIS — M2550 Pain in unspecified joint: Secondary | ICD-10-CM | POA: Insufficient documentation

## 2020-05-19 MED ORDER — ACETAMINOPHEN 500 MG PO TABS
1000.0000 mg | ORAL_TABLET | Freq: Once | ORAL | Status: AC
  Administered 2020-05-19: 1000 mg via ORAL
  Filled 2020-05-19: qty 2

## 2020-05-19 NOTE — ED Provider Notes (Signed)
[facility]-EMERGENCY DEPT Provider Note   CSN: [id number] Arrival date & time: 05/19/20  2155     History Chief Complaint  Patient presents with  . Muscle Pain    Betty Henderson is a 60 y.o. female presents to the Emergency Department complaining of gradual, persistent, progressively worsening global arthritis pain onset "a long time ago."  Patient reports that cold weather makes her joints hurt worse.  She states that she is currently homeless.  Normally takes BC powders for pain control but did not have any tonight.  Reports she does not have any other medications for this.  No recent falls.  Denies drug and alcohol usage.  Denies fever, chills, joint swelling, known injury or trauma.  The history is provided by the patient and medical records. No language interpreter was used.       Past Medical History:  Diagnosis Date  . Anemia   . Arthritis   . Chronic pain   . Drug-seeking behavior   . Malingering   . Osteopetrosis   . Psychosis (HCC)   . Schizoaffective disorder, bipolar type (HCC)     There are no problems to display for this patient.   Past Surgical History:  Procedure Laterality Date  . MOUTH SURGERY    . TUBAL LIGATION       OB History   No obstetric history on file.     Family History  Family history unknown: Yes    Social History   Tobacco Use  . Smoking status: Current Some Day Smoker    Packs/day: 1.00    Types: Cigarettes  . Smokeless tobacco: Never Used  Vaping Use  . Vaping Use: Never used  Substance Use Topics  . Alcohol use: Yes  . Drug use: Yes    Types: Cocaine    Comment: states "it's legal though"    Home Medications Prior to Admission medications   Medication Sig Start Date End Date Taking? Authorizing Provider  diclofenac Sodium (VOLTAREN) 1 % GEL Apply 4 g topically 4 (four) times daily. 04/03/20   Gailen Shelter, PA  ranitidine (ZANTAC) 150 MG capsule Take 1 capsule (150 mg total) by mouth  daily. Patient not taking: Reported on 03/20/2018 05/31/17 01/07/19  Lorre Nick, MD    Allergies    Lemon oil, Strawberry extract, Other, Adhesive  [tape], Cherry, and Proanthocyanidin  Review of Systems   Review of Systems  Constitutional: Negative for chills and fever.  Respiratory: Negative for shortness of breath.   Cardiovascular: Negative for chest pain.  Gastrointestinal: Negative for abdominal pain, nausea and vomiting.  Musculoskeletal: Positive for arthralgias and neck pain ( chronic). Negative for joint swelling.  Skin: Negative for wound.    Physical Exam Updated Vital Signs BP (!) 125/91 (BP Location: Right Arm)   Pulse 92   Temp 98 F (36.7 C) (Oral)   Resp 18   Ht 5\' 6"  (1.676 m)   Wt 79.4 kg   SpO2 94%   BMI 28.25 kg/m   Physical Exam Vitals and nursing note reviewed.  Constitutional:      General: She is not in acute distress.    Appearance: She is well-developed and well-nourished.  HENT:     Head: Normocephalic.     Mouth/Throat:     Mouth: Mucous membranes are moist.  Eyes:     General: No scleral icterus.    Conjunctiva/sclera: Conjunctivae normal.     Pupils: Pupils are equal, round, and reactive to  light.  Cardiovascular:     Rate and Rhythm: Normal rate.     Pulses: Intact distal pulses.          Radial pulses are 2+ on the right side and 2+ on the left side.       Dorsalis pedis pulses are 2+ on the right side and 2+ on the left side.  Pulmonary:     Effort: Pulmonary effort is normal.  Abdominal:     General: There is no distension.     Tenderness: There is no abdominal tenderness.  Musculoskeletal:        General: Normal range of motion.     Cervical back: Normal range of motion. No rigidity.     Comments: Moves all major joints without difficulty.  No visible swelling or palpable deformity of shoulders, elbows, wrists, hands, hips, knees, ankles, feet.  Skin:    General: Skin is warm and dry.  Neurological:     Mental Status:  She is alert.     ED Results / Procedures / Treatments    Procedures Procedures   Medications Ordered in ED Medications  acetaminophen (TYLENOL) tablet 1,000 mg (has no administration in time range)    ED Course  I have reviewed the triage vital signs and the nursing notes.  Pertinent labs & imaging results that were available during my care of the patient were reviewed by me and considered in my medical decision making (see chart for details).    MDM Rules/Calculators/A&P                           Patient with a history of chronic pain and arthritis.  She is seen often in the emergency department for similar complaints.  Work-up today is reassuring.  Patient afebrile without tachycardia.  Moves all joints that difficulty.  Highly doubt septic arthritis.  Patient given Tylenol for pain control.  Recommend follow-up with primary care physician.   Final Clinical Impression(s) / ED Diagnoses Final diagnoses:  Arthralgia, unspecified joint    Rx / DC Orders ED Discharge Orders    None       Muthersbaugh, Boyd Kerbs 05/19/20 2255    Gerhard Munch, MD 05/21/20 2210

## 2020-05-19 NOTE — ED Triage Notes (Signed)
Homeless patient reporting arthritis pain all over. Increased with cold weather.

## 2020-05-19 NOTE — Discharge Instructions (Addendum)
1. Medications: Tylenol or ibuprofen, usual home medications 2. Treatment: rest, drink plenty of fluids,  3. Follow Up: Please followup with your primary doctor in 2 days for discussion of your diagnoses and further evaluation after today's visit; if you do not have a primary care doctor use the resource guide provided to find one; Please return to the ER for falls, joint swelling, fevers or other concerns.

## 2020-05-20 ENCOUNTER — Encounter (HOSPITAL_COMMUNITY): Payer: Self-pay | Admitting: Emergency Medicine

## 2020-05-20 ENCOUNTER — Emergency Department (HOSPITAL_COMMUNITY)
Admission: EM | Admit: 2020-05-20 | Discharge: 2020-05-20 | Disposition: A | Discharge status: Home / Self Care | Payer: Self-pay | Attending: Emergency Medicine | Admitting: Emergency Medicine

## 2020-05-20 ENCOUNTER — Other Ambulatory Visit: Payer: Self-pay

## 2020-05-20 DIAGNOSIS — L84 Corns and callosities: Secondary | ICD-10-CM | POA: Insufficient documentation

## 2020-05-20 DIAGNOSIS — M79604 Pain in right leg: Secondary | ICD-10-CM | POA: Insufficient documentation

## 2020-05-20 DIAGNOSIS — M79671 Pain in right foot: Secondary | ICD-10-CM | POA: Insufficient documentation

## 2020-05-20 DIAGNOSIS — R21 Rash and other nonspecific skin eruption: Secondary | ICD-10-CM | POA: Insufficient documentation

## 2020-05-20 DIAGNOSIS — M79672 Pain in left foot: Secondary | ICD-10-CM | POA: Insufficient documentation

## 2020-05-20 DIAGNOSIS — M79605 Pain in left leg: Secondary | ICD-10-CM | POA: Insufficient documentation

## 2020-05-20 DIAGNOSIS — F17210 Nicotine dependence, cigarettes, uncomplicated: Secondary | ICD-10-CM | POA: Insufficient documentation

## 2020-05-20 MED ORDER — ACETAMINOPHEN 325 MG PO TABS
650.0000 mg | ORAL_TABLET | Freq: Once | ORAL | Status: AC
  Administered 2020-05-20: 650 mg via ORAL
  Filled 2020-05-20: qty 2

## 2020-05-20 NOTE — Discharge Instructions (Addendum)
You came to the emergency department today to be evaluated for pain to your legs, feet and rash on your feet.  Your physical exam was reassuring there was no signs of infection or injury.  Pain you are experiencing is likely due to your chronic arthritis.  Please follow-up with your primary care provider for further management of this symptom.  Get help right away if: Your foot is numb or tingling. Your foot or toes are swollen. Your foot or toes turn white or blue. You have warmth and redness along your foot.

## 2020-05-20 NOTE — ED Triage Notes (Signed)
Patient complaining of swollen feet and a rash on both feet. Patient states it is painful to walk.
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
## 2020-05-21 ENCOUNTER — Emergency Department (HOSPITAL_COMMUNITY)
Admission: EM | Admit: 2020-05-21 | Discharge: 2020-05-21 | Disposition: A | Discharge status: Home / Self Care | Payer: Self-pay | Attending: Emergency Medicine | Admitting: Emergency Medicine

## 2020-05-21 ENCOUNTER — Encounter (HOSPITAL_COMMUNITY): Payer: Self-pay | Admitting: *Deleted

## 2020-05-21 ENCOUNTER — Emergency Department (HOSPITAL_BASED_OUTPATIENT_CLINIC_OR_DEPARTMENT_OTHER)
Admission: EM | Admit: 2020-05-21 | Discharge: 2020-05-21 | Disposition: A | Discharge status: Home / Self Care | Payer: Self-pay | Attending: Emergency Medicine | Admitting: Emergency Medicine

## 2020-05-21 ENCOUNTER — Other Ambulatory Visit: Payer: Self-pay

## 2020-05-21 ENCOUNTER — Encounter (HOSPITAL_BASED_OUTPATIENT_CLINIC_OR_DEPARTMENT_OTHER): Payer: Self-pay

## 2020-05-21 DIAGNOSIS — R0989 Other specified symptoms and signs involving the circulatory and respiratory systems: Secondary | ICD-10-CM | POA: Insufficient documentation

## 2020-05-21 DIAGNOSIS — R52 Pain, unspecified: Secondary | ICD-10-CM | POA: Insufficient documentation

## 2020-05-21 DIAGNOSIS — M791 Myalgia, unspecified site: Secondary | ICD-10-CM

## 2020-05-21 DIAGNOSIS — F17210 Nicotine dependence, cigarettes, uncomplicated: Secondary | ICD-10-CM | POA: Insufficient documentation

## 2020-05-21 DIAGNOSIS — Z765 Malingerer [conscious simulation]: Secondary | ICD-10-CM | POA: Insufficient documentation

## 2020-05-21 DIAGNOSIS — M7918 Myalgia, other site: Secondary | ICD-10-CM | POA: Insufficient documentation

## 2020-05-21 MED ORDER — ACETAMINOPHEN 325 MG PO TABS
650.0000 mg | ORAL_TABLET | Freq: Once | ORAL | Status: AC
  Administered 2020-05-21: 650 mg via ORAL
  Filled 2020-05-21: qty 2

## 2020-05-21 NOTE — ED Triage Notes (Signed)
Pt arrives via [facility] with c/o back pain. VSS per EMS.   Pt states she has been walking since 12 yesterday trying to get back to [place] reports pain all over.
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
## 2020-05-21 NOTE — ED Notes (Signed)
Cursing loudly , escorted out of ED by security

## 2020-05-21 NOTE — ED Triage Notes (Signed)
Patient c/o generalized bodyaches states its cold outside, states she was seen earlier today at [facility] and they didn't help her.

## 2020-05-21 NOTE — ED Notes (Addendum)
Cursing, belligerent when given D/C instructions. ED MD and security in to see

## 2020-05-21 NOTE — ED Provider Notes (Signed)
[facility] EMERGENCY DEPARTMENT Provider Note   CSN: [id number] Arrival date & time: 05/21/20  1448     History Chief Complaint  Patient presents with  . Generalized Body Aches    Betty Henderson is a 60 y.o. female.  Patient with history of schizoaffective disorder, malingering presents the emergency department with complaint of generalized pain.  She states that she has had pain for the past day.  She states that she was trying to walk from [facility] to [employer].  Patient was seen at [facility] just 4 hours ago and discharged.  She complains of runny nose but denies other respiratory symptoms including cough.  No chest pain, abdominal pain or shortness of breath reported.  Her legs are not swelling.  She is requesting something to eat and drink.  She does not have any other specific medical complaints.        Past Medical History:  Diagnosis Date  . Anemia   . Arthritis   . Chronic pain   . Drug-seeking behavior   . Malingering   . Osteopetrosis   . Psychosis (HCC)   . Schizoaffective disorder, bipolar type (HCC)     There are no problems to display for this patient.   Past Surgical History:  Procedure Laterality Date  . MOUTH SURGERY    . TUBAL LIGATION       OB History   No obstetric history on file.     Family History  Family history unknown: Yes    Social History   Tobacco Use  . Smoking status: Current Some Day Smoker    Packs/day: 1.00    Types: Cigarettes  . Smokeless tobacco: Never Used  Vaping Use  . Vaping Use: Never used  Substance Use Topics  . Alcohol use: Yes  . Drug use: Yes    Types: Cocaine    Comment: states "it's legal though"    Home Medications Prior to Admission medications   Medication Sig Start Date End Date Taking? Authorizing Provider  diclofenac Sodium (VOLTAREN) 1 % GEL Apply 4 g topically 4 (four) times daily. 04/03/20   Gailen Shelter, PA  ranitidine (ZANTAC) 150 MG capsule  Take 1 capsule (150 mg total) by mouth daily. Patient not taking: Reported on 03/20/2018 05/31/17 01/07/19  Lorre Nick, MD    Allergies    Other, Lemon oil, Proanthocyanidin, Strawberry extract, Adhesive  [tape], and Cherry  Review of Systems   Review of Systems  Constitutional: Negative for fever.  HENT: Positive for rhinorrhea. Negative for sore throat.   Eyes: Negative for redness.  Respiratory: Negative for cough.   Cardiovascular: Negative for chest pain.  Gastrointestinal: Negative for abdominal pain, diarrhea, nausea and vomiting.  Genitourinary: Negative for dysuria, frequency, hematuria and urgency.  Musculoskeletal: Positive for myalgias.  Skin: Negative for rash.  Neurological: Negative for headaches.    Physical Exam Updated Vital Signs BP 114/82 (BP Location: Left Arm)   Pulse 74   Temp 97.6 F (36.4 C) (Oral)   Resp 16   Ht 5\' 7"  (1.702 m)   Wt 81.6 kg   SpO2 99%   BMI 28.19 kg/m   Physical Exam Vitals and nursing note reviewed.  Constitutional:      Appearance: She is well-developed.  HENT:     Head: Normocephalic and atraumatic.  Eyes:     Conjunctiva/sclera: Conjunctivae normal.  Pulmonary:     Effort: No respiratory distress.  Musculoskeletal:  Cervical back: Normal range of motion and neck supple.     Right lower leg: No edema.     Left lower leg: No edema.  Skin:    General: Skin is warm and dry.  Neurological:     Mental Status: She is alert.     ED Results / Procedures / Treatments   Labs (all labs ordered are listed, but only abnormal results are displayed) Labs Reviewed - No data to display  EKG None  Radiology No results found.  Procedures Procedures   Medications Ordered in ED Medications  acetaminophen (TYLENOL) tablet 650 mg (650 mg Oral Given 05/21/20 1626)    ED Course  I have reviewed the triage vital signs and the nursing notes.  Pertinent labs & imaging results that were available during my care of the  patient were reviewed by me and considered in my medical decision making (see chart for details).  Patient seen and examined. Will give tylenol, something to eat and drink, and discharge. Suspect element of malingering given patient's recent history.  Vital signs reviewed and are as follows: BP (!) 107/59 (BP Location: Left Arm)   Pulse 73   Temp 97.6 F (36.4 C) (Oral)   Resp 15   Ht 5\' 7"  (1.702 m)   Wt 81.6 kg   SpO2 95%   BMI 28.19 kg/m       MDM Rules/Calculators/A&P                          No dangerous or life-threatening conditions suspected or identified by history, physical exam, and by work-up. No indications for hospitalization identified. She complains of generalized muscle aches after walking a long distance. No concern for rhabdomyolysis, infection. Vital signs are normal.  Final Clinical Impression(s) / ED Diagnoses Final diagnoses:  Myalgia    Rx / DC Orders ED Discharge Orders    None       , PA-C 05/21/20 1716    05/23/20, DO 05/21/20 2040

## 2020-07-14 ENCOUNTER — Emergency Department (HOSPITAL_COMMUNITY)
Admission: EM | Admit: 2020-07-14 | Discharge: 2020-07-15 | Discharge status: Against Medical Advice | Payer: Self-pay | Attending: Emergency Medicine | Admitting: Emergency Medicine

## 2020-07-14 ENCOUNTER — Encounter (HOSPITAL_COMMUNITY): Payer: Self-pay

## 2020-07-14 ENCOUNTER — Other Ambulatory Visit: Payer: Self-pay

## 2020-07-14 DIAGNOSIS — M79604 Pain in right leg: Secondary | ICD-10-CM | POA: Insufficient documentation

## 2020-07-14 DIAGNOSIS — Z5321 Procedure and treatment not carried out due to patient leaving prior to being seen by health care provider: Secondary | ICD-10-CM | POA: Insufficient documentation

## 2020-07-14 DIAGNOSIS — M79605 Pain in left leg: Secondary | ICD-10-CM | POA: Insufficient documentation

## 2020-07-14 NOTE — ED Notes (Signed)
Called Pt for vitals no answer. 

## 2020-07-14 NOTE — ED Triage Notes (Signed)
Emergency Medicine Provider Triage Evaluation Note  Betty Henderson , a 60 y.o. female  was evaluated in triage.  Pt complains of bilateral leg pain.  She reports that this pain is chronic has been present over the last 2 years.  Patient reports that she also has a history of arthritis.  Patient reports pain is in both of her thighs.  Denies any swelling, color change, weakness, numbness or tingling to her extremities.  Review of Systems  Positive: Chronic bilateral leg pain Negative: swelling, color change, weakness, numbness or tingling to her extremities  Physical Exam  BP (!) 101/57 (BP Location: Right Arm)   Pulse 71   Temp 98.6 F (37 C) (Oral)   Resp 18   Ht 5\' 7"  (1.702 m)   Wt 81.6 kg   SpO2 100%   BMI 28.18 kg/m  Gen:   Awake, no distress   HEENT:  Atraumatic  Resp:  Normal effort  Cardiac:  Normal rate  MSK:   Moves extremities without difficulty, patient able to stand and ambulate without difficulty Neuro:  Speech clear   Medical Decision Making  Medically screening exam initiated at 8:00 PM.  Appropriate orders placed.  Betty Henderson was informed that the remainder of the evaluation will be completed by another provider, this initial triage assessment does not replace that evaluation, and the importance of remaining in the ED until their evaluation is complete.  Clinical Impression   The patient appears stable so that the remainder of the work up may be completed by another provider.      Betty Henderson, Betty Henderson 07/14/20 2002

## 2020-07-14 NOTE — ED Triage Notes (Signed)
Patient reports chronic leg pain x 2 years, reports arthritis flare today, pain to both legs

## 2020-09-28 ENCOUNTER — Other Ambulatory Visit: Payer: Self-pay

## 2020-09-28 ENCOUNTER — Encounter (HOSPITAL_BASED_OUTPATIENT_CLINIC_OR_DEPARTMENT_OTHER): Payer: Self-pay | Admitting: *Deleted

## 2020-09-28 DIAGNOSIS — F17210 Nicotine dependence, cigarettes, uncomplicated: Secondary | ICD-10-CM | POA: Insufficient documentation

## 2020-09-28 DIAGNOSIS — R062 Wheezing: Secondary | ICD-10-CM | POA: Insufficient documentation

## 2020-09-28 DIAGNOSIS — R197 Diarrhea, unspecified: Secondary | ICD-10-CM | POA: Insufficient documentation

## 2020-09-28 DIAGNOSIS — Z5900 Homelessness unspecified: Secondary | ICD-10-CM | POA: Insufficient documentation

## 2020-09-28 DIAGNOSIS — L309 Dermatitis, unspecified: Secondary | ICD-10-CM | POA: Insufficient documentation

## 2020-09-28 NOTE — ED Triage Notes (Signed)
To ER via EMS with rash that she feels is shingles. She has tiny lesions over both arms and her legs that look like she scratched her skin and a tiny scab formed. She also c.o diarrhea x 3 days.

## 2020-09-29 ENCOUNTER — Emergency Department (HOSPITAL_BASED_OUTPATIENT_CLINIC_OR_DEPARTMENT_OTHER)
Admission: EM | Admit: 2020-09-29 | Discharge: 2020-09-29 | Disposition: A | Discharge status: Home / Self Care | Payer: Self-pay | Attending: Emergency Medicine | Admitting: Emergency Medicine

## 2020-09-29 ENCOUNTER — Emergency Department (HOSPITAL_BASED_OUTPATIENT_CLINIC_OR_DEPARTMENT_OTHER): Payer: Self-pay

## 2020-09-29 DIAGNOSIS — R062 Wheezing: Secondary | ICD-10-CM

## 2020-09-29 DIAGNOSIS — R197 Diarrhea, unspecified: Secondary | ICD-10-CM

## 2020-09-29 DIAGNOSIS — Z5900 Homelessness unspecified: Secondary | ICD-10-CM

## 2020-09-29 DIAGNOSIS — L309 Dermatitis, unspecified: Secondary | ICD-10-CM

## 2020-09-29 MED ORDER — LOPERAMIDE HCL 2 MG PO CAPS
4.0000 mg | ORAL_CAPSULE | Freq: Once | ORAL | Status: AC
  Administered 2020-09-29: 4 mg via ORAL
  Filled 2020-09-29: qty 2

## 2020-09-29 MED ORDER — HYDROCORTISONE 1 % EX CREA
TOPICAL_CREAM | Freq: Two times a day (BID) | CUTANEOUS | Status: DC
  Administered 2020-09-29: 1 via TOPICAL
  Filled 2020-09-29: qty 28

## 2020-09-29 MED ORDER — ALBUTEROL SULFATE HFA 108 (90 BASE) MCG/ACT IN AERS
2.0000 | INHALATION_SPRAY | RESPIRATORY_TRACT | Status: DC | PRN
  Administered 2020-09-29: 2 via RESPIRATORY_TRACT
  Filled 2020-09-29: qty 6.7

## 2020-09-29 NOTE — ED Provider Notes (Signed)
PM  [facility] Provider Note: Lowella Dell, MD, FACEP  CSN: [id number] MRN: [id number] ARRIVAL: 09/28/20 at 2026 ROOM: [facility]05/[facility]05   CHIEF COMPLAINT  Rash   HISTORY OF PRESENT ILLNESS  09/29/20 2:10 AM Betty Henderson is a 60 y.o. female history of schizophrenia.  She is here with a generalized, pruritic rash.  The rash is eczematous.  It is not severe.  She has also had diarrhea for the past 3 days without vomiting or abdominal pain.  She has not taken anything for her symptoms because she has no income and is homeless.  Although noted to have a rattly cough and wheezing she denies being short of breath or a history of asthma.   Past Medical History:  Diagnosis Date   Anemia    Arthritis    Chronic pain    Drug-seeking behavior    Malingering    Osteopetrosis    Psychosis (HCC)    Schizoaffective disorder, bipolar type (HCC)     Past Surgical History:  Procedure Laterality Date   MOUTH SURGERY     TUBAL LIGATION      Family History  Family history unknown: Yes    Social History   Tobacco Use   Smoking status: Some Days    Packs/day: 1.00    Pack years: 0.00    Types: Cigarettes   Smokeless tobacco: Never  Vaping Use   Vaping Use: Never used  Substance Use Topics   Alcohol use: Yes   Drug use: Yes    Types: Cocaine    Comment: states "it's legal though"    Prior to Admission medications   Medication Sig Start Date End Date Taking? Authorizing Provider  ranitidine (ZANTAC) 150 MG capsule Take 1 capsule (150 mg total) by mouth daily. Patient not taking: Reported on 03/20/2018 05/31/17 01/07/19  Lorre Nick, MD    Allergies Other, Lemon oil, Proanthocyanidin, Strawberry extract, Adhesive  [tape], and Cherry   REVIEW OF SYSTEMS  Negative except as noted here or in the History of Present Illness.   PHYSICAL EXAMINATION  Initial Vital Signs Blood pressure 136/62, pulse 84, temperature 98.5 F (36.9 C), temperature source Oral, resp.  rate 18, height 5\' 7"  (1.702 m), weight 81.6 kg, SpO2 100 %.  Examination General: Well-developed, thin female in no acute distress; appears older than age of record HENT: normocephalic; atraumatic Eyes: Normal appearance Neck: supple Heart: regular rate and rhythm Lungs: Wheezing; rattly cough Abdomen: soft; nondistended; nontender; bowel sounds present Extremities: Chronic appearing deformities of toes; full range of motion Neurologic: Sleeping but readily awakens; noted to move all extremities skin: Warm and dry Psychiatric: Flat affect   RESULTS  Summary of this visit's results, reviewed and interpreted by myself:   EKG Interpretation  Date/Time:    Ventricular Rate:    PR Interval:    QRS Duration:   QT Interval:    QTC Calculation:   R Axis:     Text Interpretation:          Laboratory Studies: No results found for this or any previous visit (from the past 24 hour(s)). Imaging Studies: DG Chest 2 View  Result Date: 09/29/2020 CLINICAL DATA:  Rash, diarrhea EXAM: CHEST - 2 VIEW COMPARISON:  07/05/2019 FINDINGS: Lungs are clear.  No pleural effusion or pneumothorax. The heart is normal in size. Visualized osseous structures are within normal limits. IMPRESSION: Normal chest radiographs. Electronically Signed   By: 09/04/2019 M.D.   On: 09/29/2020 02:39  ED COURSE and MDM  Nursing notes, initial and subsequent vitals signs, including pulse oximetry, reviewed and interpreted by myself.  Vitals:   09/28/20 2046 09/28/20 2048 09/29/20 0117  BP:  (!) 118/95 136/62  Pulse:  64 84  Resp:  18 18  Temp:  99.2 F (37.3 C) 98.5 F (36.9 C)  TempSrc:  Oral Oral  SpO2:  100% 100%  Weight: 81.6 kg    Height: 5\' 7"  (1.702 m)     Medications  hydrocortisone cream 1 % (1 application Topical Given 09/29/20 0236)  albuterol (VENTOLIN HFA) 108 (90 Base) MCG/ACT inhaler 2 puff (has no administration in time range)  loperamide (IMODIUM) capsule 4 mg (4 mg Oral Given  09/29/20 0235)   Patient given hydrocortisone cream for her rash and her diarrhea.  We will provide an inhaler for her wheezing.   PROCEDURES  Procedures   ED DIAGNOSES     ICD-10-CM   1. Eczema, unspecified type  L30.9     2. Diarrhea of presumed infectious origin  R19.7     3. Wheezing  R06.2     4. Homelessness  Z59.00          Essica Kiker, 11/30/20, MD 09/29/20 [phone]

## 2020-09-29 NOTE — ED Notes (Signed)
Pt given cheese, crackers, and coffee

## 2020-10-04 ENCOUNTER — Encounter (HOSPITAL_BASED_OUTPATIENT_CLINIC_OR_DEPARTMENT_OTHER): Payer: Self-pay | Admitting: *Deleted

## 2020-10-04 ENCOUNTER — Emergency Department (HOSPITAL_BASED_OUTPATIENT_CLINIC_OR_DEPARTMENT_OTHER)
Admission: EM | Admit: 2020-10-04 | Discharge: 2020-10-05 | Disposition: A | Discharge status: Home / Self Care | Payer: Self-pay | Attending: Emergency Medicine | Admitting: Emergency Medicine

## 2020-10-04 ENCOUNTER — Other Ambulatory Visit: Payer: Self-pay

## 2020-10-04 ENCOUNTER — Emergency Department (HOSPITAL_BASED_OUTPATIENT_CLINIC_OR_DEPARTMENT_OTHER): Payer: Self-pay

## 2020-10-04 DIAGNOSIS — M791 Myalgia, unspecified site: Secondary | ICD-10-CM | POA: Insufficient documentation

## 2020-10-04 DIAGNOSIS — F17210 Nicotine dependence, cigarettes, uncomplicated: Secondary | ICD-10-CM | POA: Insufficient documentation

## 2020-10-04 DIAGNOSIS — R748 Abnormal levels of other serum enzymes: Secondary | ICD-10-CM | POA: Insufficient documentation

## 2020-10-04 DIAGNOSIS — Z79899 Other long term (current) drug therapy: Secondary | ICD-10-CM | POA: Insufficient documentation

## 2020-10-04 LAB — CBC WITH DIFFERENTIAL/PLATELET
Abs Immature Granulocytes: 0.02 10*3/uL (ref 0.00–0.07)
Basophils Absolute: 0 10*3/uL (ref 0.0–0.1)
Basophils Relative: 0 %
Eosinophils Absolute: 0.3 10*3/uL (ref 0.0–0.5)
Eosinophils Relative: 4 %
HCT: 38.4 % (ref 36.0–46.0)
Hemoglobin: 12.6 g/dL (ref 12.0–15.0)
Immature Granulocytes: 0 %
Lymphocytes Relative: 35 %
Lymphs Abs: 2.4 10*3/uL (ref 0.7–4.0)
MCH: 29.9 pg (ref 26.0–34.0)
MCHC: 32.8 g/dL (ref 30.0–36.0)
MCV: 91 fL (ref 80.0–100.0)
Monocytes Absolute: 0.6 10*3/uL (ref 0.1–1.0)
Monocytes Relative: 9 %
Neutro Abs: 3.5 10*3/uL (ref 1.7–7.7)
Neutrophils Relative %: 52 %
Platelets: 279 10*3/uL (ref 150–400)
RBC: 4.22 MIL/uL (ref 3.87–5.11)
RDW: 13.7 % (ref 11.5–15.5)
WBC: 6.8 10*3/uL (ref 4.0–10.5)
nRBC: 0 % (ref 0.0–0.2)

## 2020-10-04 MED ORDER — ACETAMINOPHEN 325 MG PO TABS
650.0000 mg | ORAL_TABLET | Freq: Once | ORAL | Status: AC
  Administered 2020-10-04: 650 mg via ORAL
  Filled 2020-10-04: qty 2
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
## 2020-10-05 LAB — RAPID URINE DRUG SCREEN, HOSP PERFORMED
Amphetamines: NOT DETECTED
Barbiturates: NOT DETECTED
Benzodiazepines: NOT DETECTED
Cocaine: NOT DETECTED
Opiates: NOT DETECTED
Tetrahydrocannabinol: NOT DETECTED

## 2020-10-05 LAB — COMPREHENSIVE METABOLIC PANEL
ALT: 16 U/L (ref 0–44)
AST: 22 U/L (ref 15–41)
Albumin: 4.2 g/dL (ref 3.5–5.0)
Alkaline Phosphatase: 58 U/L (ref 38–126)
Anion gap: 8 (ref 5–15)
BUN: 15 mg/dL (ref 8–23)
CO2: 25 mmol/L (ref 22–32)
Calcium: 9.4 mg/dL (ref 8.9–10.3)
Chloride: 107 mmol/L (ref 98–111)
Creatinine, Ser: 0.85 mg/dL (ref 0.44–1.00)
GFR, Estimated: >60 mL/min (ref >60)
Glucose, Bld: 114 mg/dL — ABNORMAL HIGH (ref 70–99)
Potassium: 3.9 mmol/L (ref 3.5–5.1)
Sodium: 140 mmol/L (ref 135–145)
Total Bilirubin: 0.4 mg/dL (ref 0.3–1.2)
Total Protein: 7.4 g/dL (ref 6.5–8.1)

## 2020-10-05 LAB — URINALYSIS, ROUTINE W REFLEX MICROSCOPIC
Bilirubin Urine: NEGATIVE
Glucose, UA: NEGATIVE mg/dL
Hgb urine dipstick: NEGATIVE
Ketones, ur: NEGATIVE mg/dL
Nitrite: NEGATIVE
Protein, ur: NEGATIVE mg/dL
Specific Gravity, Urine: 1.02 (ref 1.005–1.030)
pH: 6.5 (ref 5.0–8.0)

## 2020-10-05 LAB — URINALYSIS, MICROSCOPIC (REFLEX)

## 2020-10-05 LAB — CK: Total CK: 491 U/L — ABNORMAL HIGH (ref 38–234)

## 2020-10-05 NOTE — ED Notes (Signed)
Pt requesting cheese, crackers, and coffee. OK per RN Shaun pt given the same

## 2020-10-05 NOTE — Discharge Instructions (Addendum)
Drink plenty of fluids.  Take ibuprofen or acetaminophen as needed for pain.

## 2020-10-20 ENCOUNTER — Emergency Department (HOSPITAL_BASED_OUTPATIENT_CLINIC_OR_DEPARTMENT_OTHER)
Admission: EM | Admit: 2020-10-20 | Discharge: 2020-10-20 | Disposition: A | Discharge status: Home / Self Care | Payer: Self-pay | Attending: Emergency Medicine | Admitting: Emergency Medicine

## 2020-10-20 ENCOUNTER — Encounter (HOSPITAL_BASED_OUTPATIENT_CLINIC_OR_DEPARTMENT_OTHER): Payer: Self-pay | Admitting: *Deleted

## 2020-10-20 ENCOUNTER — Other Ambulatory Visit: Payer: Self-pay

## 2020-10-20 DIAGNOSIS — Z5900 Homelessness unspecified: Secondary | ICD-10-CM | POA: Insufficient documentation

## 2020-10-20 DIAGNOSIS — F17210 Nicotine dependence, cigarettes, uncomplicated: Secondary | ICD-10-CM | POA: Insufficient documentation

## 2020-10-20 MED ORDER — ACETAMINOPHEN 325 MG PO TABS
650.0000 mg | ORAL_TABLET | Freq: Once | ORAL | Status: AC
  Administered 2020-10-20: 650 mg via ORAL
  Filled 2020-10-20: qty 2

## 2020-10-20 NOTE — ED Provider Notes (Signed)
[facility] EMERGENCY DEPARTMENT Provider Note   CSN: [id number] Arrival date & time: 10/20/20  1313     History Chief Complaint  Patient presents with   Homeless    Betty Henderson is a 60 y.o. female.  Patient is here for food.  She is homeless.  She denies any suicidal ideation or homicidal ideation.  She is pleasant.  Asking for Tylenol as well.  The history is provided by the patient.  Illness Severity:  Mild Onset quality:  Gradual Associated symptoms: no fever       Past Medical History:  Diagnosis Date   Anemia    Arthritis    Chronic pain    Drug-seeking behavior    Malingering    Osteopetrosis    Psychosis (HCC)    Schizoaffective disorder, bipolar type (HCC)     There are no problems to display for this patient.   Past Surgical History:  Procedure Laterality Date   MOUTH SURGERY     TUBAL LIGATION       OB History   No obstetric history on file.     Family History  Family history unknown: Yes    Social History   Tobacco Use   Smoking status: Some Days    Packs/day: 1.00    Types: Cigarettes   Smokeless tobacco: Never  Vaping Use   Vaping Use: Never used  Substance Use Topics   Alcohol use: Yes   Drug use: Yes    Types: Cocaine    Comment: states "it's legal though"    Home Medications Prior to Admission medications   Medication Sig Start Date End Date Taking? Authorizing Provider  ranitidine (ZANTAC) 150 MG capsule Take 1 capsule (150 mg total) by mouth daily. Patient not taking: Reported on 03/20/2018 05/31/17 01/07/19  Lorre Nick, MD    Allergies    Other, Lemon oil, Proanthocyanidin, Strawberry extract, Adhesive  [tape], and Cherry  Review of Systems   Review of Systems  Constitutional:  Negative for fever.  Musculoskeletal:  Positive for arthralgias.  Skin:  Negative for wound.  Psychiatric/Behavioral:  Negative for suicidal ideas. The patient is not nervous/anxious.    Physical Exam Updated Vital  Signs BP 113/73   Pulse 73   Temp 98.2 F (36.8 C) (Oral)   Resp 16   Ht 5\' 6"  (1.676 m)   Wt 72.6 kg   SpO2 100%   BMI 25.82 kg/m   Physical Exam HENT:     Head: Normocephalic.  Pulmonary:     Effort: Pulmonary effort is normal.  Skin:    Capillary Refill: Capillary refill takes less than 2 seconds.  Neurological:     General: No focal deficit present.     Mental Status: She is alert.  Psychiatric:        Mood and Affect: Mood normal.        Behavior: Behavior normal.    ED Results / Procedures / Treatments   Labs (all labs ordered are listed, but only abnormal results are displayed) Labs Reviewed - No data to display  EKG None  Radiology No results found.  Procedures Procedures   Medications Ordered in ED Medications  acetaminophen (TYLENOL) tablet 650 mg (has no administration in time range)    ED Course  I have reviewed the triage vital signs and the nursing notes.  Pertinent labs & imaging results that were available during my care of the patient were reviewed by me and considered in my medical  decision making (see chart for details).    MDM Rules/Calculators/A&P                           Betty Henderson is here for a meal.  She is homeless.  She denies any suicidal homicidal ideation.  She is asking for some Tylenol for some body aches.  Overall normal vitals.  She appears well.  Was given food and Tylenol and discharged.  This chart was dictated using voice recognition software.  Despite best efforts to proofread,  errors can occur which can change the documentation meaning.   Final Clinical Impression(s) / ED Diagnoses Final diagnoses:  Homelessness    Rx / DC Orders ED Discharge Orders     None        Virgina Norfolk, DO 10/20/20 1617

## 2020-10-20 NOTE — ED Triage Notes (Signed)
Pt in triage requesting a ride back to [facility] states she did not want to come here .

## 2020-10-20 NOTE — ED Triage Notes (Signed)
[facility] report-pt was picked up from a restaurant-c/o bilat LE pain x 1 year-multiple visits to [facility] ED for same c/o

## 2020-10-26 ENCOUNTER — Other Ambulatory Visit: Payer: Self-pay

## 2020-10-26 ENCOUNTER — Encounter (HOSPITAL_BASED_OUTPATIENT_CLINIC_OR_DEPARTMENT_OTHER): Payer: Self-pay | Admitting: *Deleted

## 2020-10-26 ENCOUNTER — Emergency Department (HOSPITAL_BASED_OUTPATIENT_CLINIC_OR_DEPARTMENT_OTHER)
Admission: EM | Admit: 2020-10-26 | Discharge: 2020-10-27 | Disposition: A | Discharge status: Home / Self Care | Payer: Self-pay | Attending: Emergency Medicine | Admitting: Emergency Medicine

## 2020-10-26 DIAGNOSIS — F17210 Nicotine dependence, cigarettes, uncomplicated: Secondary | ICD-10-CM | POA: Insufficient documentation

## 2020-10-26 DIAGNOSIS — M79652 Pain in left thigh: Secondary | ICD-10-CM | POA: Insufficient documentation

## 2020-10-26 DIAGNOSIS — M79651 Pain in right thigh: Secondary | ICD-10-CM | POA: Insufficient documentation

## 2020-10-26 NOTE — ED Triage Notes (Signed)
To ER via EMS. Back pain. Ambulatory in no distress.
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
## 2020-10-27 ENCOUNTER — Emergency Department (HOSPITAL_BASED_OUTPATIENT_CLINIC_OR_DEPARTMENT_OTHER)
Admission: EM | Admit: 2020-10-27 | Discharge: 2020-10-27 | Disposition: A | Discharge status: Home / Self Care | Payer: Self-pay | Attending: Emergency Medicine | Admitting: Emergency Medicine

## 2020-10-27 ENCOUNTER — Other Ambulatory Visit: Payer: Self-pay

## 2020-10-27 ENCOUNTER — Encounter (HOSPITAL_BASED_OUTPATIENT_CLINIC_OR_DEPARTMENT_OTHER): Payer: Self-pay | Admitting: *Deleted

## 2020-10-27 DIAGNOSIS — Z5902 Unsheltered homelessness: Secondary | ICD-10-CM | POA: Insufficient documentation

## 2020-10-27 DIAGNOSIS — Z5900 Homelessness unspecified: Secondary | ICD-10-CM

## 2020-10-27 DIAGNOSIS — F17210 Nicotine dependence, cigarettes, uncomplicated: Secondary | ICD-10-CM | POA: Insufficient documentation

## 2020-10-27 MED ORDER — ACETAMINOPHEN 500 MG PO TABS
1000.0000 mg | ORAL_TABLET | Freq: Once | ORAL | Status: AC
  Administered 2020-10-27: 1000 mg via ORAL
  Filled 2020-10-27: qty 2

## 2020-10-27 NOTE — ED Notes (Signed)
Stated "I feel better now that I have blankets and can lay down and rest". Was noted eating chips with no signs of distress by triage nurse prior to coming back to room.

## 2020-10-27 NOTE — ED Notes (Signed)
Pt is discharged but allowed to rest and given meal at this time.

## 2020-10-27 NOTE — ED Notes (Signed)
Patient in lobby eating potato chips despite being asked to not eat until being seen by provider.

## 2020-10-27 NOTE — ED Provider Notes (Signed)
[facility] EMERGENCY DEPARTMENT Provider Note  CSN: [id number] Arrival date & time: 10/26/20 2144  Chief Complaint(s) Back Pain  HPI Betty Henderson is a 60 y.o. female with a past medical history listed below here for several days of bilateral thigh pain.  Recurrent and similar to prior episodes.  Usually exacerbated with "weather changes."  Improves when she is "warm."  Denies any falls or trauma.  No other physical complaints  HPI  Past Medical History Past Medical History:  Diagnosis Date   Anemia    Arthritis    Chronic pain    Drug-seeking behavior    Malingering    Osteopetrosis    Psychosis (HCC)    Schizoaffective disorder, bipolar type (HCC)    There are no problems to display for this patient.  Home Medication(s) Prior to Admission medications   Medication Sig Start Date End Date Taking? Authorizing Provider  ranitidine (ZANTAC) 150 MG capsule Take 1 capsule (150 mg total) by mouth daily. Patient not taking: Reported on 03/20/2018 05/31/17 01/07/19  Lorre Nick, MD                                                                                                                                    Past Surgical History Past Surgical History:  Procedure Laterality Date   MOUTH SURGERY     TUBAL LIGATION     Family History Family History  Family history unknown: Yes    Social History Social History   Tobacco Use   Smoking status: Some Days    Packs/day: 1.00    Types: Cigarettes   Smokeless tobacco: Never  Vaping Use   Vaping Use: Never used  Substance Use Topics   Alcohol use: Yes   Drug use: Yes    Types: Cocaine    Comment: states "it's legal though"   Allergies Other, Lemon oil, Proanthocyanidin, Strawberry extract, Adhesive  [tape], and Cherry  Review of Systems Review of Systems All other systems are reviewed and are negative for acute change except as noted in the HPI  Physical Exam Vital Signs  I have reviewed the triage  vital signs BP 105/72 (BP Location: Right Arm)   Pulse 67   Temp 98.3 F (36.8 C) (Oral)   Resp 18   Ht 5\' 6"  (1.676 m)   Wt 72.6 kg   SpO2 97%   BMI 25.83 kg/m   Physical Exam Vitals reviewed.  Constitutional:      General: She is not in acute distress.    Appearance: She is well-developed. She is not diaphoretic.  HENT:     Head: Normocephalic and atraumatic.     Right Ear: External ear normal.     Left Ear: External ear normal.     Nose: Nose normal.  Eyes:     General: No scleral icterus.    Conjunctiva/sclera: Conjunctivae normal.  Neck:     Trachea: Phonation  normal.  Cardiovascular:     Rate and Rhythm: Normal rate and regular rhythm.  Pulmonary:     Effort: Pulmonary effort is normal. No respiratory distress.     Breath sounds: No stridor.  Abdominal:     General: There is no distension.  Musculoskeletal:        General: Normal range of motion.     Cervical back: Normal range of motion. No tenderness.     Thoracic back: No tenderness.     Lumbar back: No tenderness.     Right upper leg: Tenderness (mild) present.     Left upper leg: Tenderness (mild) present.     Right foot: Normal range of motion. Normal pulse.     Left foot: Normal range of motion. Normal pulse.  Neurological:     Mental Status: She is alert and oriented to person, place, and time.  Psychiatric:        Behavior: Behavior normal.    ED Results and Treatments Labs (all labs ordered are listed, but only abnormal results are displayed) Labs Reviewed - No data to display                                                                                                                       EKG  EKG Interpretation  Date/Time:    Ventricular Rate:    PR Interval:    QRS Duration:   QT Interval:    QTC Calculation:   R Axis:     Text Interpretation:         Radiology No results found.  Pertinent labs & imaging results that were available during my care of the patient were reviewed  by me and considered in my medical decision making (see MDM for details).  Medications Ordered in ED Medications  acetaminophen (TYLENOL) tablet 1,000 mg (1,000 mg Oral Given 10/27/20 0208)                                                                                                                                    Procedures Procedures  (including critical care time)  Medical Decision Making / ED Course I have reviewed the nursing notes for this encounter and the patient's prior records (if available in EHR or on provided paperwork).  [name] was evaluated in Emergency Department on 10/27/2020 for the symptoms described in the history of present illness. She was evaluated  in the context of the global COVID-19 pandemic, which necessitated consideration that the patient might be at risk for infection with the SARS-CoV-2 virus that causes COVID-19. Institutional protocols and algorithms that pertain to the evaluation of patients at risk for COVID-19 are in a state of rapid change based on information released by regulatory bodies including the CDC and federal and state organizations. These policies and algorithms were followed during the patient's care in the ED.     Mild bilateral thigh pain. No trauma requiring imaging. Ambulated well. Doubt cauda equina. Appears to have been seen at [facility] for the same 2 days ago. I have low suspicion for DVT, arterial occlusion or other serious process, requiring work up at this time.   Final Clinical Impression(s) / ED Diagnoses Final diagnoses:  Bilateral thigh pain   The patient appears reasonably screened and/or stabilized for discharge and I doubt any other medical condition or other [facility] requiring further screening, evaluation, or treatment in the ED at this time prior to discharge. Safe for discharge with strict return precautions.  Disposition: Discharge  Condition: Good  I have discussed the results, Dx and Tx plan with the  patient/family who expressed understanding and agree(s) with the plan. Discharge instructions discussed at length. The patient/family was given strict return precautions who verbalized understanding of the instructions. No further questions at time of discharge.    ED Discharge Orders     None       Follow Up: Primary care provider  Call  if you do not have a primary care physician, contact [facility] at [phone] for referral     This chart was dictated using voice recognition software.  Despite best efforts to proofread,  errors can occur which can change the documentation meaning.    Nira Conn, MD 10/27/20 [phone]

## 2020-10-27 NOTE — ED Triage Notes (Signed)
Abdominal pain. She was here yesterday for back pain.

## 2020-10-27 NOTE — Discharge Instructions (Addendum)
It was a pleasure taking care of you today.  I have included resources for shelters in the community.  Please follow-up with PCP symptoms not improved within the next week.  Return to the ER for new or worsening symptoms.

## 2020-10-27 NOTE — ED Provider Notes (Signed)
[facility] EMERGENCY DEPARTMENT Provider Note   CSN: [id number] Arrival date & time: 10/27/20  1622     History Chief Complaint  Patient presents with   Abdominal Pain    Betty Henderson is a 60 y.o. female with a past medical history significant for chronic pain syndrome, schizoaffective disorder, history of malingering, and history of drug-seeking behavior who presents to the ED due to "soreness all over".  During my initial evaluation patient states her soreness has resolved after being given warm blankets.  Per RN, patient stated she was here for a place to sleep.  Patient denies abdominal pain, chest pain, shortness of breath, headache.  No physical complaints during my initial evaluation.  Patient was seen eating potato chips in the waiting room.    History obtained from patient and past medical records. No interpreter used during encounter.       Past Medical History:  Diagnosis Date   Anemia    Arthritis    Chronic pain    Drug-seeking behavior    Malingering    Osteopetrosis    Psychosis (HCC)    Schizoaffective disorder, bipolar type (HCC)     There are no problems to display for this patient.   Past Surgical History:  Procedure Laterality Date   MOUTH SURGERY     TUBAL LIGATION       OB History   No obstetric history on file.     Family History  Family history unknown: Yes    Social History   Tobacco Use   Smoking status: Some Days    Packs/day: 1.00    Types: Cigarettes   Smokeless tobacco: Never  Vaping Use   Vaping Use: Never used  Substance Use Topics   Alcohol use: Yes   Drug use: Yes    Types: Cocaine    Comment: states "it's legal though"    Home Medications Prior to Admission medications   Medication Sig Start Date End Date Taking? Authorizing Provider  ranitidine (ZANTAC) 150 MG capsule Take 1 capsule (150 mg total) by mouth daily. Patient not taking: Reported on 03/20/2018 05/31/17 01/07/19  Lorre Nick, MD     Allergies    Other, Lemon oil, Proanthocyanidin, Strawberry extract, Adhesive  [tape], and Cherry  Review of Systems   Review of Systems  Constitutional:  Negative for chills and fever.  HENT:  Negative for rhinorrhea and sore throat.   Eyes:  Negative for visual disturbance.  Respiratory:  Negative for shortness of breath.   Cardiovascular:  Negative for chest pain and palpitations.  Gastrointestinal:  Negative for abdominal pain.  Genitourinary:  Negative for dysuria.  Musculoskeletal:  Negative for myalgias.  Skin:  Negative for color change and rash.  Neurological:  Negative for dizziness and light-headedness.   Physical Exam Updated Vital Signs BP 108/76 (BP Location: Right Arm)   Pulse 91   Temp 98.4 F (36.9 C) (Oral)   Resp 18   Ht 5\' 6"  (1.676 m)   Wt 72.6 kg   SpO2 98%   BMI 25.83 kg/m   Physical Exam Vitals and nursing note reviewed.  Constitutional:      General: She is not in acute distress.    Appearance: She is not ill-appearing.  HENT:     Head: Normocephalic.  Eyes:     Pupils: Pupils are equal, round, and reactive to light.  Cardiovascular:     Rate and Rhythm: Normal rate and regular rhythm.     Pulses:  Normal pulses.     Heart sounds: Normal heart sounds. No murmur heard.   No friction rub. No gallop.  Pulmonary:     Effort: Pulmonary effort is normal.     Breath sounds: Normal breath sounds.  Abdominal:     General: Abdomen is flat. There is no distension.     Palpations: Abdomen is soft.     Tenderness: There is no abdominal tenderness. There is no guarding or rebound.  Musculoskeletal:        General: Normal range of motion.     Cervical back: Neck supple.  Skin:    General: Skin is warm and dry.  Neurological:     General: No focal deficit present.     Mental Status: She is alert.  Psychiatric:        Mood and Affect: Mood normal.        Behavior: Behavior normal.    ED Results / Procedures / Treatments   Labs (all labs  ordered are listed, but only abnormal results are displayed) Labs Reviewed - No data to display  EKG None  Radiology No results found.  Procedures Procedures   Medications Ordered in ED Medications - No data to display  ED Course  I have reviewed the triage vital signs and the nursing notes.  Pertinent labs & imaging results that were available during my care of the patient were reviewed by me and considered in my medical decision making (see chart for details).    MDM Rules/Calculators/A&P                           60 year old female presents to the ED requesting a place to sleep.  During my initial evaluation, patient states her soreness has resolved due to warm blankets she was given.  Patient has been seen 14 times in the ED over the past 6 months.  Patient seen last night for a different complaint.  Patient denies any physical complaints at this time.  Vitals all within normal limits.  Physical exam reassuring.  Abdomen soft, nondistended, nontender.  Low suspicion for acute abdomen.  Patient given food and shelter information. Strict ED precautions discussed with patient. Patient states understanding and agrees to plan. Patient discharged home in no acute distress and stable vitals  Final Clinical Impression(s) / ED Diagnoses Final diagnoses:  Homelessness    Rx / DC Orders ED Discharge Orders     None        Jesusita Oka 10/27/20 Valrie Hart, MD 11/03/20 [phone]

## 2021-01-08 ENCOUNTER — Encounter (HOSPITAL_COMMUNITY): Payer: Self-pay | Admitting: Emergency Medicine

## 2021-01-08 ENCOUNTER — Emergency Department (HOSPITAL_COMMUNITY)
Admission: EM | Admit: 2021-01-08 | Discharge: 2021-01-09 | Disposition: A | Discharge status: Home / Self Care | Payer: Self-pay | Attending: Student | Admitting: Student

## 2021-01-08 ENCOUNTER — Other Ambulatory Visit: Payer: Self-pay

## 2021-01-08 DIAGNOSIS — R197 Diarrhea, unspecified: Secondary | ICD-10-CM | POA: Insufficient documentation

## 2021-01-08 DIAGNOSIS — R1084 Generalized abdominal pain: Secondary | ICD-10-CM | POA: Insufficient documentation

## 2021-01-08 DIAGNOSIS — Z5321 Procedure and treatment not carried out due to patient leaving prior to being seen by health care provider: Secondary | ICD-10-CM | POA: Insufficient documentation

## 2021-01-08 DIAGNOSIS — R5383 Other fatigue: Secondary | ICD-10-CM | POA: Insufficient documentation

## 2021-01-08 NOTE — ED Triage Notes (Signed)
Pt reports fatigue, generalized abd pn and diarrhea x 3 months.

## 2021-01-08 NOTE — ED Provider Notes (Signed)
Emergency Medicine Provider Triage Evaluation Note  Kathlyne Loud , a 60 y.o. female  was evaluated in triage.  Pt complains of fatigue for past 2-3 months. Patient reports problems with fatigue, intermittent diarrhea, and abdominal pain. No alleviating/aggravating factors.   Review of Systems  Positive: Abdominal pain, diarrhea, fatigue Negative: Fever, chest pain, dyspnea  Physical Exam  BP 122/78 (BP Location: Right Arm)   Pulse 61   Temp 97.8 F (36.6 C) (Oral)   Resp 20   SpO2 100%  Gen:   Awake, no distress   Resp:  Normal effort  MSK:   Moves extremities without difficulty  Other:  No peritoneal signs on abdominal exam.   Medical Decision Making  Medically screening exam initiated at 11:47 PM.  Appropriate orders placed.  Keishia Ground was informed that the remainder of the evaluation will be completed by another provider, this initial triage assessment does not replace that evaluation, and the importance of remaining in the ED until their evaluation is complete. Labs ordered.  Fatigue.   Desmond Lope 01/08/21 2349    Mesner, Barbara Cower, MD 01/09/21 [phone]

## 2021-01-09 ENCOUNTER — Other Ambulatory Visit: Payer: Self-pay

## 2021-01-09 ENCOUNTER — Emergency Department (HOSPITAL_COMMUNITY)
Admission: EM | Admit: 2021-01-09 | Discharge: 2021-01-10 | Discharge status: Against Medical Advice | Payer: Self-pay | Attending: Student | Admitting: Student

## 2021-01-09 ENCOUNTER — Encounter (HOSPITAL_COMMUNITY): Payer: Self-pay

## 2021-01-09 ENCOUNTER — Emergency Department (HOSPITAL_COMMUNITY): Payer: Self-pay

## 2021-01-09 DIAGNOSIS — Z5321 Procedure and treatment not carried out due to patient leaving prior to being seen by health care provider: Secondary | ICD-10-CM | POA: Insufficient documentation

## 2021-01-09 DIAGNOSIS — Z20822 Contact with and (suspected) exposure to covid-19: Secondary | ICD-10-CM | POA: Insufficient documentation

## 2021-01-09 DIAGNOSIS — N644 Mastodynia: Secondary | ICD-10-CM | POA: Insufficient documentation

## 2021-01-09 DIAGNOSIS — N898 Other specified noninflammatory disorders of vagina: Secondary | ICD-10-CM | POA: Insufficient documentation

## 2021-01-09 DIAGNOSIS — R531 Weakness: Secondary | ICD-10-CM | POA: Insufficient documentation

## 2021-01-09 LAB — URINALYSIS, ROUTINE W REFLEX MICROSCOPIC
Bilirubin Urine: NEGATIVE
Glucose, UA: NEGATIVE mg/dL
Hgb urine dipstick: NEGATIVE
Ketones, ur: NEGATIVE mg/dL
Nitrite: NEGATIVE
Protein, ur: NEGATIVE mg/dL
Specific Gravity, Urine: 1.016 (ref 1.005–1.030)
pH: 6 (ref 5.0–8.0)

## 2021-01-09 LAB — CBC WITH DIFFERENTIAL/PLATELET
Abs Immature Granulocytes: 0.01 10*3/uL (ref 0.00–0.07)
Basophils Absolute: 0 10*3/uL (ref 0.0–0.1)
Basophils Relative: 1 %
Eosinophils Absolute: 0.3 10*3/uL (ref 0.0–0.5)
Eosinophils Relative: 4 %
HCT: 37.1 % (ref 36.0–46.0)
Hemoglobin: 11.8 g/dL — ABNORMAL LOW (ref 12.0–15.0)
Immature Granulocytes: 0 %
Lymphocytes Relative: 38 %
Lymphs Abs: 2.3 10*3/uL (ref 0.7–4.0)
MCH: 30.4 pg (ref 26.0–34.0)
MCHC: 31.8 g/dL (ref 30.0–36.0)
MCV: 95.6 fL (ref 80.0–100.0)
Monocytes Absolute: 0.5 10*3/uL (ref 0.1–1.0)
Monocytes Relative: 9 %
Neutro Abs: 3 10*3/uL (ref 1.7–7.7)
Neutrophils Relative %: 48 %
Platelets: 313 10*3/uL (ref 150–400)
RBC: 3.88 MIL/uL (ref 3.87–5.11)
RDW: 14.3 % (ref 11.5–15.5)
WBC: 6.1 10*3/uL (ref 4.0–10.5)
nRBC: 0 % (ref 0.0–0.2)

## 2021-01-09 LAB — COMPREHENSIVE METABOLIC PANEL WITH GFR
ALT: 14 U/L (ref 0–44)
AST: 16 U/L (ref 15–41)
Albumin: 3.8 g/dL (ref 3.5–5.0)
Alkaline Phosphatase: 52 U/L (ref 38–126)
Anion gap: 7 (ref 5–15)
BUN: 11 mg/dL (ref 8–23)
CO2: 25 mmol/L (ref 22–32)
Calcium: 9.5 mg/dL (ref 8.9–10.3)
Chloride: 108 mmol/L (ref 98–111)
Creatinine, Ser: 0.8 mg/dL (ref 0.44–1.00)
GFR, Estimated: >60 mL/min
Glucose, Bld: 180 mg/dL — ABNORMAL HIGH (ref 70–99)
Potassium: 4.3 mmol/L (ref 3.5–5.1)
Sodium: 140 mmol/L (ref 135–145)
Total Bilirubin: 0.4 mg/dL (ref 0.3–1.2)
Total Protein: 6.8 g/dL (ref 6.5–8.1)

## 2021-01-09 LAB — RESP PANEL BY RT-PCR (FLU A&B, COVID) ARPGX2
Influenza A by PCR: NEGATIVE
Influenza B by PCR: NEGATIVE
SARS Coronavirus 2 by RT PCR: NEGATIVE

## 2021-01-09 LAB — TSH: TSH: 1.077 u[IU]/mL (ref 0.350–4.500)

## 2021-01-09 LAB — LIPASE, BLOOD: Lipase: 50 U/L (ref 11–51)

## 2021-01-09 NOTE — ED Notes (Signed)
Called patient for up to date vitals patient didn't answer

## 2021-01-09 NOTE — ED Triage Notes (Signed)
Pt reports chronic weakness, left breast pain and vaginal discharge. States she usually goes to [facility] but they haven't given her an IV so she came here.

## 2021-01-09 NOTE — ED Provider Notes (Signed)
Emergency Medicine Provider Triage Evaluation Note  Betty Henderson , a 60 y.o. female  was evaluated in triage.  Pt complains of weakness and shortness of breath.  Seen at [facility] yesterday.  Review of Systems  Positive: Weakness, shortness of breath, vaginal discharge, breast pain Negative:   Physical Exam  BP 127/65   Pulse 76   Temp 98.1 F (36.7 C) (Oral)   Resp 20   SpO2 100%  Gen:   Awake, no distress   Resp:  Normal effort  MSK:   Moves extremities without difficulty  Other:    Medical Decision Making  Medically screening exam initiated at 5:26 PM.  Appropriate orders placed.  Betty Henderson was informed that the remainder of the evaluation will be completed by another provider, this initial triage assessment does not replace that evaluation, and the importance of remaining in the ED until their evaluation is complete.    Patient seen at [facility] last night for similar complaints.  Requesting an IV for pain medication.  History of drug-seeking behavior.   Betty Henderson 01/09/21 1728    Betty Munch, MD 01/09/21 2203

## 2021-01-11 ENCOUNTER — Encounter (HOSPITAL_COMMUNITY): Payer: Self-pay | Admitting: *Deleted

## 2021-01-11 ENCOUNTER — Other Ambulatory Visit: Payer: Self-pay

## 2021-01-11 ENCOUNTER — Emergency Department (HOSPITAL_COMMUNITY)
Admission: EM | Admit: 2021-01-11 | Discharge: 2021-01-12 | Disposition: A | Discharge status: Home / Self Care | Payer: Self-pay | Attending: Emergency Medicine | Admitting: Emergency Medicine

## 2021-01-11 DIAGNOSIS — Z79899 Other long term (current) drug therapy: Secondary | ICD-10-CM | POA: Insufficient documentation

## 2021-01-11 DIAGNOSIS — F17210 Nicotine dependence, cigarettes, uncomplicated: Secondary | ICD-10-CM | POA: Insufficient documentation

## 2021-01-11 DIAGNOSIS — R55 Syncope and collapse: Secondary | ICD-10-CM | POA: Insufficient documentation

## 2021-01-11 DIAGNOSIS — R451 Restlessness and agitation: Secondary | ICD-10-CM | POA: Insufficient documentation

## 2021-01-11 DIAGNOSIS — F22 Delusional disorders: Secondary | ICD-10-CM | POA: Insufficient documentation

## 2021-01-11 DIAGNOSIS — Z5900 Homelessness unspecified: Secondary | ICD-10-CM | POA: Insufficient documentation

## 2021-01-11 DIAGNOSIS — Z20822 Contact with and (suspected) exposure to covid-19: Secondary | ICD-10-CM | POA: Insufficient documentation

## 2021-01-11 LAB — COMPREHENSIVE METABOLIC PANEL
ALT: 15 U/L (ref 0–44)
AST: 16 U/L (ref 15–41)
Albumin: 4.1 g/dL (ref 3.5–5.0)
Alkaline Phosphatase: 60 U/L (ref 38–126)
Anion gap: 8 (ref 5–15)
BUN: 15 mg/dL (ref 8–23)
CO2: 25 mmol/L (ref 22–32)
Calcium: 9.4 mg/dL (ref 8.9–10.3)
Chloride: 105 mmol/L (ref 98–111)
Creatinine, Ser: 0.84 mg/dL (ref 0.44–1.00)
GFR, Estimated: >60 mL/min (ref >60)
Glucose, Bld: 104 mg/dL — ABNORMAL HIGH (ref 70–99)
Potassium: 3.7 mmol/L (ref 3.5–5.1)
Sodium: 138 mmol/L (ref 135–145)
Total Bilirubin: 0.5 mg/dL (ref 0.3–1.2)
Total Protein: 7.2 g/dL (ref 6.5–8.1)

## 2021-01-11 LAB — CBC WITH DIFFERENTIAL/PLATELET
Abs Immature Granulocytes: 0.01 10*3/uL (ref 0.00–0.07)
Basophils Absolute: 0 10*3/uL (ref 0.0–0.1)
Basophils Relative: 1 %
Eosinophils Absolute: 0.2 10*3/uL (ref 0.0–0.5)
Eosinophils Relative: 4 %
HCT: 38.9 % (ref 36.0–46.0)
Hemoglobin: 12.1 g/dL (ref 12.0–15.0)
Immature Granulocytes: 0 %
Lymphocytes Relative: 36 %
Lymphs Abs: 2.4 10*3/uL (ref 0.7–4.0)
MCH: 29.5 pg (ref 26.0–34.0)
MCHC: 31.1 g/dL (ref 30.0–36.0)
MCV: 94.9 fL (ref 80.0–100.0)
Monocytes Absolute: 0.5 10*3/uL (ref 0.1–1.0)
Monocytes Relative: 7 %
Neutro Abs: 3.4 10*3/uL (ref 1.7–7.7)
Neutrophils Relative %: 52 %
Platelets: 330 10*3/uL (ref 150–400)
RBC: 4.1 MIL/uL (ref 3.87–5.11)
RDW: 14 % (ref 11.5–15.5)
WBC: 6.5 10*3/uL (ref 4.0–10.5)
nRBC: 0 % (ref 0.0–0.2)

## 2021-01-11 LAB — RESP PANEL BY RT-PCR (FLU A&B, COVID) ARPGX2
Influenza A by PCR: NEGATIVE
Influenza B by PCR: NEGATIVE
SARS Coronavirus 2 by RT PCR: NEGATIVE

## 2021-01-11 LAB — ETHANOL: Alcohol, Ethyl (B): <10 mg/dL (ref <10)

## 2021-01-11 NOTE — ED Notes (Signed)
Patient called for vitals recheck x1 with no response 

## 2021-01-11 NOTE — ED Provider Notes (Signed)
Emergency Medicine Provider Triage Evaluation Note  Betty Henderson , a 60 y.o. female  was evaluated in triage.  Pt complains of weakness, vaginal discharge, s blackouts x5 months..  She was seen at [facility] a few days ago, was MSE yesterday here but left before being seen.  Patient has a history of homelessness, denies being on any medication.  Denies any suicidal ideation or homicidal ideation..  Review of Systems  Positive: Vaginal discharge, blackouts, weakness Negative: Chest pain  Physical Exam  BP 127/60 (BP Location: Right Arm)   Pulse 69   Temp 97.7 F (36.5 C) (Oral)   Resp 18   SpO2 100%  Gen:   Awake, no distress   Resp:  Normal effort  MSK:   Moves extremities without difficulty  Other:  Patient is having word salad, tangential thinking.  Does appear to be responding to internal stimuli.  Medical Decision Making  Medically screening exam initiated at 7:59 PM.  Appropriate orders placed.  Vernisha Bacote was informed that the remainder of the evaluation will be completed by another provider, this initial triage assessment does not replace that evaluation, and the importance of remaining in the ED until their evaluation is complete.  Suspicion of chronic homelessness versus acute psychiatric process.  We will medically clear and have psych eval.   Theron Arista, PA-C 01/11/21 2000    Rozelle Logan, DO 01/11/21 2354

## 2021-01-11 NOTE — ED Triage Notes (Signed)
Pt states she is here for blacking out x 6 months and diarrhea.  States she has lost consciousness every day this week.

## 2021-01-12 LAB — RAPID URINE DRUG SCREEN, HOSP PERFORMED
Amphetamines: NOT DETECTED
Barbiturates: NOT DETECTED
Benzodiazepines: NOT DETECTED
Cocaine: POSITIVE — AB
Opiates: NOT DETECTED
Tetrahydrocannabinol: NOT DETECTED

## 2021-01-12 NOTE — ED Notes (Signed)
Pt ambulated to bathroom. No assistance needed.

## 2021-01-12 NOTE — ED Notes (Signed)
Attempted to obtained patients urine, patient did not need to use the bathroom at this time.

## 2021-01-12 NOTE — Discharge Instructions (Signed)
Substance Abuse Treatment Programs ° °Intensive Outpatient Programs °[facility]     °[address]      °[place], [place]                   °[phone]      ° °[facility] °[address] °[place], [place] °[phone] ° °[facility]     °(Inpatient and outpatient)     °[address]           °[phone]   ° °[facility] °[phone] (Suboxone and Methadone) ° °[address]      °[place], [place] [address]      °336-882-2125      ° °[address] °[place], [place] °852-3033 ° °[facility] (Outpatient/Inpatient, Chemical)    °(insurance only) [phone]      °       °[facility] (Groups & Residential) °[place], [place] °[phone] ° °   °[facility]     °[address]     °[place], [place]      °[phone]      ° °[facility] (for caregivers and family) °[address] °[place], [place] °336-299-4655 ° ° ° ° ° °Residential Treatment Programs °[facility]      °[address], , [place] [address]  °([phone]      ° °[facility] °[address] [place], [place] [address] °[phone] ° °[facility]        °336-248-8914      ° °[facility] °1-800-659-3381 ° °[facility]             °[address]                                         °[place], [place]                                                °877-615-2722 or 336-784-9470                              ° °[facility] [place] °[address] °[place], [address] °1.877.941.8954 ° °[facility]    °[address]      °, [place]     °[phone]      ° °[facility] °[address] °, [place] °336-285-9073 ° °[facility]   °[address]     °[place], [place] [address]     °[phone]      °Admissions: 8am-3pm M-F ° °[facility] °[address] °[place],  [place] °336-227-7417 ° °[facility]: Residential Program (90 Days)   °[place], [place]      °336-725-8389 or 800-758-6077    ° °[facility] °[place], [place] °(Walk in Hours over the weekend or by referral) ° °[facility] °[address], [place], [place] [address] °([phone] ° °C[facility]is Mobile: [facility]:  1-877-626-1772 (for crisis response 24 hours a day) °[facility] Hotline:      1-800-256-2452 °Outpatient Psychiatry and Counseling ° °[facility]: Mobile Crisis   Management 24 hours:  1-877-626-1772 ° °[facility] sliding scale fee and walk in schedule: M-F 8am-12pm/1pm-3pm °[address]  °[place], [place] [address] °[phone] ° °[facility] °[address] °[place], [place] [address] °336-703-9650 ° °[facility] ([facility])- new patient walk-in appointments available Monday - Friday 8am -3pm.          °[address] °[place], [place] [address] °336-676-6840 or crisis line- 336-676-6905 ° °[facility]/ Intensive Outpatient Therapy Program °[address] °[place], [place] [address] °336-832-9804 ° °[facility]                  °Crisis Services      °[phone]      °[address]     °[place], [place] [address]                ° °[facility]   °[facility] °[phone] °[address] °[place], [place] [address] ° ° °[facility]          °[address],  °[place], [place] [address]       °([phone] ° °[facility] °[address] °[place], [place] [address] °336-292-1510 ° °[facility]    °[address]    °[place], [place] [address]     °[phone]      ° °Parish McKinney, MD     °[address]     °[place] [address]     °336-282-1251     °  °[facility] °[address] °[place] [address] ° °[facility]     °[address]     °[place], [place]      °336-542-2076      ° °[facility] °Shamsher Ahluwalia, MD °[address] °[place], [place] [address] °[phone] ° °[facility]     °[address]     °[place], [place] [address]     °336-274-1237      ° °[facility] °[address] °[place], [place] [address] °336-854-4450 °Resources for Temporary Residential Assistance/Crisis Centers ° °DAY CENTERS °[facility] °M-F 8am-3pm   °[address] [place], [place] [address]   336-332-0824 °Services include: laundry, barbering, support groups, case management, phone  & computer access, showers, AA/NA mtgs, mental health/substance abuse nurse, job skills class, disability information, VA assistance, spiritual classes, etc.  ° °HOMELESS SHELTERS ° °[facility]     °[facility]   °[address], [place]     °336.271.5959       °       °[facility] (women and children)       °[address] °[place], [place] [address] °336-275-0820 °[email or web address] for application and process °Application Required ° °[facility]   °[address]    °[place] [address]     °[phone]       °             °[facility] °[address] °[place], [place] [address] °336.273.5572 °336-235-0363(schedule application appt.) °Application Required ° °[facility] (women only)    °[address]     °[place], [place] [address]     °336-884-1039      °  Intake starts 6pm daily °Need valid ID, SSC, & Police report °[facility] [place] °[address] °[place], [place] °336-881-5420 °Application Required ° °[facility] (men only)     °[address]      °[place], [place]     °[phone]      ° °[facility] °(Pregnant women only) °[address] °[place], [place] °336-275-0206 ° °[facility]      °[address]      °[place], [place] [address]     °[phone]      °       °[facility] °[address] °[place], [place] °336-723-1848 °90 day commitment/SA/Application process ° °[facility](men only)     °[address]     °[place], [place]     °[phone]       °Check-in at 7pm     °       °[facility] °[address] °[place], [place] [address] °336-248-6684 °Men/Women/Women and Children must be there by 7 pm ° °[facility] °[place], [place] °336-722-8721                ° °

## 2021-01-12 NOTE — ED Notes (Signed)
Patient given crackers and tea to drink. Will ambulate shortly.

## 2021-01-12 NOTE — BH Assessment (Signed)
Clinician made contact with pt via the Tele-Assessment machine in an effort to complete her MH Assessment. Pt states she is in the ED "to be diagnosed with what's wrong with me." She states there is something wrong with her leg and her knee; pt's speech was somewhat slurred/garbled, so it was difficult at times to completely understand what she was saying. Pt denies SI or a hx of SI. She denies she's ever attempted to kill herself. When pt was asked if she has ever been hospitalized for mental health concerns, she began yelling and swearing at clinician and threatened clinician to "get out of [my room]." Clinician explained that she was assessing pt to determine what is wrong so we can try to help her; pt again began yelling and swearing at clinician and sat up in an attempt to push the Tele-Assessment machine. Clinician shared that it was pt's right to refuse to participate in the assessment and that she would inform pt's EDP that she was unwilling to participate at this time. Pt continued yelling and swearing at clinician; clinician ended the call after thanking pt for her time.

## 2021-01-12 NOTE — ED Provider Notes (Signed)
Emergency Department Provider Note   I have reviewed the triage vital signs and the nursing notes.   HISTORY  Chief Complaint Loss of Consciousness   HPI Betty Henderson is a 60 y.o. female with past medical history reviewed below presents emergency department with complaint of passing out.  Patient describes passing out multiple times over the past 6 months.  She notes some associated chronic pain.  She has difficulty describing the specific events surrounding her passing out.  She describes it as if she is "falling asleep." Denies any CP or SOB. No fever or chills. Patient with history of homelessness and psychiatric disorder. Tells me she is not taking any medications. 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 Denies any thoughts of harming herself or others. Denies EtOH or drugs.   Past Medical History:  Diagnosis Date   Anemia    Arthritis    Chronic pain    Drug-seeking behavior    Malingering    Osteopetrosis    Psychosis (HCC)    Schizoaffective disorder, bipolar type (HCC)     There are no problems to display for this patient.   Past Surgical History:  Procedure Laterality Date   MOUTH SURGERY     TUBAL LIGATION      Allergies Other, Lemon oil, Proanthocyanidin, Strawberry extract, Adhesive  [tape], and Cherry  Family History  Family history unknown: Yes    Social History Social History   Tobacco Use   Smoking status: Some Days    Packs/day: 1.00    Types: Cigarettes   Smokeless tobacco: Never  Vaping Use   Vaping Use: Never used  Substance Use Topics   Alcohol use: Yes    Comment: occ   Drug use: Not Currently    Types: Cocaine    Comment: states "it's legal though"    Review of Systems  Constitutional: No fever/chills Eyes: No visual changes. ENT: No sore throat. Cardiovascular: Denies chest pain. Reports episodes of "blacking out."  Respiratory: Denies  shortness of breath. Gastrointestinal: No abdominal pain.  No nausea, no vomiting.  No diarrhea.  No constipation. Genitourinary: Negative for dysuria. Musculoskeletal: Negative for back pain. Skin: Negative for rash. Neurological: Negative for headaches, focal weakness or numbness. Psychiatric: Denies SI/HI.   10-point ROS otherwise negative.  ____________________________________________   PHYSICAL EXAM:  VITAL SIGNS: ED Triage Vitals  Enc Vitals Group     BP 01/11/21 1954 127/60     Pulse Rate 01/11/21 1954 69     Resp 01/11/21 1954 18     Temp 01/11/21 1954 97.7 F (36.5 C)     Temp Source 01/11/21 1954 Oral     SpO2 01/11/21 1954 100 %     Weight 01/11/21 2014 160 lb (72.6 kg)     Height 01/11/21 2014 5\' 6"  (1.676 m)   Constitutional: Alert and oriented. No distress.  Eyes: Conjunctivae are normal.  Head: Atraumatic. Nose: No congestion/rhinnorhea. Mouth/Throat: Mucous membranes are moist.   Neck: No stridor.  Cardiovascular: Normal rate, regular rhythm. Good peripheral circulation. Grossly normal heart sounds.   Respiratory: Normal respiratory effort.  No retractions. Lungs CTAB. Gastrointestinal: Soft and nontender. No distention.  Musculoskeletal: No gross deformities of extremities. Neurologic:  Normal speech and language. No gross focal neurologic deficits are appreciated.  Skin:  Skin is warm, dry and intact. No rash noted. Psychiatric: Mood and affect are somewhat bizarre. Exhibiting  some delusional thinking. Occasional agitation but no aggressive behavior or threats.  ____________________________________________   LABS (all labs ordered are listed, but only abnormal results are displayed)  Labs Reviewed  COMPREHENSIVE METABOLIC PANEL - Abnormal; Notable for the following components:      Result Value   Glucose, Bld 104 (*)    All other components within normal limits  RAPID URINE DRUG SCREEN, HOSP PERFORMED - Abnormal; Notable for the following  components:   Cocaine POSITIVE (*)    All other components within normal limits  RESP PANEL BY RT-PCR (FLU A&B, COVID) ARPGX2  ETHANOL  CBC WITH DIFFERENTIAL/PLATELET   ____________________________________________  EKG   EKG Interpretation  Date/Time:  Thursday January 11 2021 20:22:58 EDT Ventricular Rate:  70 PR Interval:  152 QRS Duration: 90 QT Interval:  400 QTC Calculation: 432 R Axis:   68 Text Interpretation: Normal sinus rhythm Nonspecific ST abnormality Abnormal ECG Confirmed by Alona Bene [id number]) on 01/12/2021 12:25:12 AM        ____________________________________________  RADIOLOGY  None   ____________________________________________   PROCEDURES  Procedure(s) performed:   Procedures  None  ____________________________________________   INITIAL IMPRESSION / ASSESSMENT AND PLAN / ED COURSE  Pertinent labs & imaging results that were available during my care of the patient were reviewed by me and considered in my medical decision making (see chart for details).   Patient presents emergency department with initial report of passing out which she describes more of frequently falling asleep.  She has history of homelessness as well as documented history of psychiatric disease.  She denies taking any medications currently.  She is exhibiting delusional thinking here but denies any suicidal or homicidal ideation.  She is not being aggressive.  I see multiple ED presentations both at this facility and others with similar complaints.  Do plan for screening blood work here for further evaluation.  Will reassess to determine the need for acute TTS evaluation in the emergency setting versus outpatient resources.   06:11 PM  Patient is up and ambulatory to the bathroom.  She is eating and drinking without difficulty.  Asked CTS to evaluate but she would not engage significantly with them.  She continues to deny suicidal or homicidal ideation.  We will provide a  list of community resources and discharge at this time.  ____________________________________________  FINAL CLINICAL IMPRESSION(S) / ED DIAGNOSES  Final diagnoses:  Syncope, unspecified syncope type  Homelessness     Note:  This document was prepared using Dragon voice recognition software and may include unintentional dictation errors.  Alona Bene, MD, [facility] Emergency Medicine    Iliza Blankenbeckler, Arlyss Repress, MD 01/13/21 [phone]

## 2021-02-07 ENCOUNTER — Emergency Department (HOSPITAL_COMMUNITY)
Admission: EM | Admit: 2021-02-07 | Discharge: 2021-02-07 | Disposition: A | Discharge status: Home / Self Care | Payer: Self-pay | Attending: Emergency Medicine | Admitting: Emergency Medicine

## 2021-02-07 DIAGNOSIS — X31XXXA Exposure to excessive natural cold, initial encounter: Secondary | ICD-10-CM | POA: Insufficient documentation

## 2021-02-07 DIAGNOSIS — F17210 Nicotine dependence, cigarettes, uncomplicated: Secondary | ICD-10-CM | POA: Insufficient documentation

## 2021-02-07 DIAGNOSIS — T699XXA Effect of reduced temperature, unspecified, initial encounter: Secondary | ICD-10-CM | POA: Insufficient documentation

## 2021-02-07 NOTE — ED Provider Notes (Signed)
[facility] EMERGENCY DEPARTMENT Provider Note   CSN: [id number] Arrival date & time: 02/07/21  1638     History Chief Complaint  Patient presents with   Frostbite    Betty Henderson is a 60 y.o. female with a past medical history of malingering, schizoaffective disorder and chronic pain presenting today complaining of frostbite to her hands and her feet.  Reports this has been going on since she worked for [employer]  HPI     Past Medical History:  Diagnosis Date   Anemia    Arthritis    Chronic pain    Drug-seeking behavior    Malingering    Osteopetrosis    Psychosis ([place])    Schizoaffective disorder, bipolar type ([place])     There are no problems to display for this patient.   Past Surgical History:  Procedure Laterality Date   MOUTH SURGERY     TUBAL LIGATION       OB History   No obstetric history on file.     Family History  Family history unknown: Yes    Social History   Tobacco Use   Smoking status: Some Days    Packs/day: 1.00    Types: Cigarettes   Smokeless tobacco: Never  Vaping Use   Vaping Use: Never used  Substance Use Topics   Alcohol use: Yes    Comment: occ   Drug use: Not Currently    Types: Cocaine    Comment: states "it's legal though"    Home Medications Prior to Admission medications   Medication Sig Start Date End Date Taking? Authorizing Provider  ranitidine (ZANTAC) 150 MG capsule Take 1 capsule (150 mg total) by mouth daily. Patient not taking: Reported on 03/20/2018 05/31/17 01/07/19  Lacretia Leigh, MD    Allergies    Other, Lemon oil, Proanthocyanidin, Strawberry extract, Adhesive  [tape], and Cherry  Review of Systems   Review of Systems  Reason unable to perform ROS: Mental status and patient not participatory.   Physical Exam Updated Vital Signs BP 113/67 (BP Location: Right Arm)   Pulse 75   Temp 98.2 F (36.8 C) (Oral)   Resp 18   SpO2 100%   Physical Exam Vitals and  nursing note reviewed.  Constitutional:      Appearance: Normal appearance.  HENT:     Head: Normocephalic and atraumatic.  Eyes:     General: No scleral icterus.    Conjunctiva/sclera: Conjunctivae normal.  Pulmonary:     Effort: Pulmonary effort is normal. No respiratory distress.  Musculoskeletal:        General: No deformity. Normal range of motion.     Comments: Strong DP pulses bilaterally.  Strong radial pulses.  Hands and feet warm  Skin:    General: Skin is warm and dry.     Coloration: Skin is not pale.     Findings: No bruising or rash.  Neurological:     Mental Status: She is alert.  Psychiatric:        Mood and Affect: Mood normal.    ED Results / Procedures / Treatments   Labs (all labs ordered are listed, but only abnormal results are displayed) Labs Reviewed - No data to display  EKG None  Radiology No results found.  Procedures Procedures   Medications Ordered in ED Medications - No data to display  ED Course  I have reviewed the triage vital signs and the nursing notes.  Pertinent labs & imaging  results that were available during my care of the patient were reviewed by me and considered in my medical decision making (see chart for details).    MDM Rules/Calculators/A&P Patient's extremities were warm to the touch.  Strong distal pulses.  When I told her that she did not have any frostbite she told me that she become dizzy and was passing out recently.  When I asked her how long this has been going on she said "what is I have to do with anything."  Stating that she will go to [facility] instead.  Discharge papers to be prepared at this time.  Final Clinical Impression(s) / ED Diagnoses Final diagnoses:  Cold exposure, initial encounter    Rx / DC Orders Results and diagnoses were explained to the patient. Return precautions discussed in full. Patient had no additional questions and expressed complete understanding.     Saddie Benders,  PA-C 02/07/21 1801    Gerhard Munch, MD 02/07/21 [phone]

## 2021-02-07 NOTE — ED Triage Notes (Signed)
Pt c/o frostbite "all over." Sleeping in triage chair.

## 2021-02-07 NOTE — Discharge Instructions (Signed)
Information about frostbite is attached to these discharge papers.  Please travel safely to [facility].

## 2021-02-10 ENCOUNTER — Emergency Department (HOSPITAL_BASED_OUTPATIENT_CLINIC_OR_DEPARTMENT_OTHER)
Admission: EM | Admit: 2021-02-10 | Discharge: 2021-02-10 | Disposition: A | Discharge status: Home / Self Care | Payer: Self-pay | Attending: Emergency Medicine | Admitting: Emergency Medicine

## 2021-02-10 ENCOUNTER — Encounter (HOSPITAL_BASED_OUTPATIENT_CLINIC_OR_DEPARTMENT_OTHER): Payer: Self-pay | Admitting: Urology

## 2021-02-10 DIAGNOSIS — F17210 Nicotine dependence, cigarettes, uncomplicated: Secondary | ICD-10-CM | POA: Insufficient documentation

## 2021-02-10 DIAGNOSIS — M25551 Pain in right hip: Secondary | ICD-10-CM | POA: Insufficient documentation

## 2021-02-10 DIAGNOSIS — G8929 Other chronic pain: Secondary | ICD-10-CM | POA: Insufficient documentation

## 2021-02-10 MED ORDER — KETOROLAC TROMETHAMINE 60 MG/2ML IM SOLN
60.0000 mg | Freq: Once | INTRAMUSCULAR | Status: AC
  Administered 2021-02-10: 60 mg via INTRAMUSCULAR
  Filled 2021-02-10: qty 2

## 2021-02-10 MED ORDER — LIDOCAINE 5 % EX PTCH
1.0000 | MEDICATED_PATCH | Freq: Once | CUTANEOUS | Status: DC
  Administered 2021-02-10: 1 via TRANSDERMAL
  Filled 2021-02-10: qty 1

## 2021-02-10 NOTE — ED Provider Notes (Signed)
[facility] EMERGENCY DEPARTMENT Provider Note   CSN: [id number] Arrival date & time: 02/10/21  2054     History Chief Complaint  Patient presents with   Hip Pain    Betty Henderson is a 60 y.o. female.  The history is provided by the patient.  Hip Pain This is a chronic problem. The problem occurs daily. The problem has not changed since onset.Pertinent negatives include no chest pain, no abdominal pain, no headaches and no shortness of breath. Nothing aggravates the symptoms. Nothing relieves the symptoms. She has tried nothing for the symptoms. The treatment provided no relief.      Past Medical History:  Diagnosis Date   Anemia    Arthritis    Chronic pain    Drug-seeking behavior    Malingering    Osteopetrosis    Psychosis (HCC)    Schizoaffective disorder, bipolar type (HCC)     There are no problems to display for this patient.   Past Surgical History:  Procedure Laterality Date   MOUTH SURGERY     TUBAL LIGATION       OB History   No obstetric history on file.     Family History  Family history unknown: Yes    Social History   Tobacco Use   Smoking status: Some Days    Packs/day: 1.00    Types: Cigarettes   Smokeless tobacco: Never  Vaping Use   Vaping Use: Never used  Substance Use Topics   Alcohol use: Yes    Comment: occ   Drug use: Not Currently    Types: Cocaine    Comment: states "it's legal though"    Home Medications Prior to Admission medications   Medication Sig Start Date End Date Taking? Authorizing Provider  ranitidine (ZANTAC) 150 MG capsule Take 1 capsule (150 mg total) by mouth daily. Patient not taking: Reported on 03/20/2018 05/31/17 01/07/19  Lorre Nick, MD    Allergies    Other, Lemon oil, Proanthocyanidin, Strawberry extract, Adhesive  [tape], and Cherry  Review of Systems   Review of Systems  Constitutional:  Negative for chills and fever.  HENT:  Negative for ear pain and sore throat.    Eyes:  Negative for pain and visual disturbance.  Respiratory:  Negative for cough and shortness of breath.   Cardiovascular:  Negative for chest pain and palpitations.  Gastrointestinal:  Negative for abdominal pain and vomiting.  Genitourinary:  Negative for dysuria and hematuria.  Musculoskeletal:  Negative for arthralgias and back pain.  Skin:  Negative for color change and rash.  Neurological:  Negative for seizures, syncope and headaches.  All other systems reviewed and are negative.  Physical Exam Updated Vital Signs BP 113/65 (BP Location: Right Arm)   Pulse 62   Temp 98 F (36.7 C) (Oral)   Resp 16   Ht 5\' 6"  (1.676 m)   Wt 72.6 kg   SpO2 100%   BMI 25.82 kg/m   Physical Exam Vitals and nursing note reviewed.  Constitutional:      General: She is not in acute distress.    Appearance: She is well-developed.  HENT:     Head: Normocephalic and atraumatic.     Nose: Nose normal.     Mouth/Throat:     Mouth: Mucous membranes are moist.  Eyes:     Extraocular Movements: Extraocular movements intact.     Conjunctiva/sclera: Conjunctivae normal.     Pupils: Pupils are equal, round, and reactive to  light.  Cardiovascular:     Rate and Rhythm: Normal rate and regular rhythm.     Heart sounds: No murmur heard. Pulmonary:     Effort: Pulmonary effort is normal. No respiratory distress.     Breath sounds: Normal breath sounds.  Abdominal:     Palpations: Abdomen is soft.     Tenderness: There is no abdominal tenderness.  Musculoskeletal:        General: Tenderness present. No swelling.     Cervical back: Neck supple.     Comments: Some tenderness to the right hip, no midline spinal tenderness  Skin:    General: Skin is warm and dry.     Capillary Refill: Capillary refill takes less than 2 seconds.  Neurological:     General: No focal deficit present.     Mental Status: She is alert.     Cranial Nerves: No cranial nerve deficit.     Sensory: No sensory deficit.      Motor: No weakness.     Coordination: Coordination normal.     Comments: Patient is ambulatory without any major issues  Psychiatric:        Mood and Affect: Mood normal.    ED Results / Procedures / Treatments   Labs (all labs ordered are listed, but only abnormal results are displayed) Labs Reviewed - No data to display  EKG None  Radiology No results found.  Procedures Procedures   Medications Ordered in ED Medications  ketorolac (TORADOL) injection 60 mg (has no administration in time range)  lidocaine (LIDODERM) 5 % 1 patch (has no administration in time range)    ED Course  I have reviewed the triage vital signs and the nursing notes.  Pertinent labs & imaging results that were available during my care of the patient were reviewed by me and considered in my medical decision making (see chart for details).    MDM Rules/Calculators/A&P                           Betty Henderson is here for acute on chronic right hip pain.  Normal vitals.  No fever.  History of schizophrenia, drug-seeking behavior.  She overall appears well.  States that she has been walking a lot recently.  Pain mostly to the right hip.  Denies any trauma.  She is ambulatory in the room without any issues.  Denies any cauda equina symptoms.  Does not appear to be a harm to herself or others.  She has tangential speech but she does not appear manic.  Patient was given Toradol lidocaine patch.  She was given food and discharge.  Neurovascularly neuromuscularly intact.  This chart was dictated using voice recognition software.  Despite best efforts to proofread,  errors can occur which can change the documentation meaning.   Final Clinical Impression(s) / ED Diagnoses Final diagnoses:  Right hip pain    Rx / DC Orders ED Discharge Orders     None        Lennice Sites, DO 02/10/21 2115
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
## 2021-02-10 NOTE — ED Triage Notes (Signed)
Chronic right hip pain x years.

## 2021-02-28 ENCOUNTER — Other Ambulatory Visit: Payer: Self-pay

## 2021-02-28 ENCOUNTER — Encounter (HOSPITAL_BASED_OUTPATIENT_CLINIC_OR_DEPARTMENT_OTHER): Payer: Self-pay

## 2021-02-28 DIAGNOSIS — Y92410 Unspecified street and highway as the place of occurrence of the external cause: Secondary | ICD-10-CM | POA: Insufficient documentation

## 2021-02-28 DIAGNOSIS — M791 Myalgia, unspecified site: Secondary | ICD-10-CM | POA: Insufficient documentation

## 2021-02-28 DIAGNOSIS — Z59819 Housing instability, housed unspecified: Secondary | ICD-10-CM | POA: Insufficient documentation

## 2021-02-28 DIAGNOSIS — R451 Restlessness and agitation: Secondary | ICD-10-CM | POA: Insufficient documentation

## 2021-02-28 DIAGNOSIS — M2550 Pain in unspecified joint: Secondary | ICD-10-CM | POA: Insufficient documentation

## 2021-02-28 DIAGNOSIS — Z20818 Contact with and (suspected) exposure to other bacterial communicable diseases: Secondary | ICD-10-CM | POA: Insufficient documentation

## 2021-02-28 DIAGNOSIS — F17210 Nicotine dependence, cigarettes, uncomplicated: Secondary | ICD-10-CM | POA: Insufficient documentation
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
## 2021-03-01 ENCOUNTER — Emergency Department (HOSPITAL_BASED_OUTPATIENT_CLINIC_OR_DEPARTMENT_OTHER)
Admission: EM | Admit: 2021-03-01 | Discharge: 2021-03-01 | Disposition: A | Discharge status: Home / Self Care | Payer: Self-pay | Attending: Emergency Medicine | Admitting: Emergency Medicine

## 2021-03-01 DIAGNOSIS — Z59819 Housing instability, housed unspecified: Secondary | ICD-10-CM

## 2021-03-01 DIAGNOSIS — Z03818 Encounter for observation for suspected exposure to other biological agents ruled out: Secondary | ICD-10-CM

## 2021-03-01 NOTE — ED Notes (Signed)
Pt ambulatory from chair to wheelchair and from wheelchair to stretcher independently without assistance. Gait stable.

## 2021-03-01 NOTE — ED Notes (Signed)
Pt continued to use foul and demeaning language with staff. Pt discharge paperwork discussed with pt. Security at bedside escorted pt to lobby. Pt provided with happy meal and clean socks. Pt noncompliant with discharge vs.
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
## 2021-03-12 ENCOUNTER — Other Ambulatory Visit: Payer: Self-pay

## 2021-03-12 ENCOUNTER — Emergency Department (HOSPITAL_COMMUNITY)
Admission: EM | Admit: 2021-03-12 | Discharge: 2021-03-13 | Disposition: A | Discharge status: Home / Self Care | Payer: Self-pay | Attending: Emergency Medicine | Admitting: Emergency Medicine

## 2021-03-12 DIAGNOSIS — M79605 Pain in left leg: Secondary | ICD-10-CM | POA: Insufficient documentation

## 2021-03-12 DIAGNOSIS — F17210 Nicotine dependence, cigarettes, uncomplicated: Secondary | ICD-10-CM | POA: Insufficient documentation

## 2021-03-12 NOTE — ED Triage Notes (Signed)
Pt reported to ED with c/o pain to BLE. States it happens when it gets cold outside. Pt noted to be sleeping in room upon this RN's arrival for triage.

## 2021-03-12 NOTE — ED Provider Notes (Signed)
Emergency Medicine Provider Triage Evaluation Note  Betty Henderson , a 60 y.o. female  was evaluated in triage.  Pt complains of bilateral leg pain.  She states that her legs always hurt in the wintertime.  She has not taken anything..  Review of Systems  Positive: Leg pain Negative: weakness  Physical Exam  BP 121/60 (BP Location: Right Arm)    Pulse 81    Temp 98.6 F (37 C) (Oral)    Resp 16    SpO2 99%  Gen:   Awake, no distress   Resp:  Normal effort  MSK:   Moves extremities without difficulty  Other:  No obvious swelling.  Normal pulses  Medical Decision Making  Medically screening exam initiated at 9:50 PM.  Appropriate orders placed.  Ronnell Makarewicz was informed that the remainder of the evaluation will be completed by another provider, this initial triage assessment does not replace that evaluation, and the importance of remaining in the ED until their evaluation is complete.  Leg pain. No injuries   Arthor Captain, PA-C 03/12/21 2155    Pollyann Savoy, MD 03/12/21 [phone]

## 2021-03-13 ENCOUNTER — Emergency Department (HOSPITAL_COMMUNITY): Payer: Self-pay

## 2021-03-13 NOTE — ED Notes (Signed)
Pt verbalizes understanding of discharge instructions. Opportunity for questions and answers were provided. Pt discharged from the ED.   ?

## 2021-03-13 NOTE — ED Notes (Signed)
Patient was given a [place] Sandwich bag w/ cheese, graham crackers, and cup of coffee.
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
## 2021-03-13 NOTE — Discharge Instructions (Signed)
As discussed, your evaluation today has been largely reassuring.  But, it is important that you monitor your condition carefully, and do not hesitate to return to the ED if you develop new, or concerning changes in your condition. ? ?Otherwise, please follow-up with your physician for appropriate ongoing care. ? ?

## 2021-03-15 ENCOUNTER — Emergency Department (HOSPITAL_COMMUNITY)
Admission: EM | Admit: 2021-03-15 | Discharge: 2021-03-16 | Disposition: A | Discharge status: Home / Self Care | Payer: Self-pay | Attending: Emergency Medicine | Admitting: Emergency Medicine

## 2021-03-15 ENCOUNTER — Other Ambulatory Visit: Payer: Self-pay

## 2021-03-15 DIAGNOSIS — M549 Dorsalgia, unspecified: Secondary | ICD-10-CM | POA: Insufficient documentation

## 2021-03-15 DIAGNOSIS — F17210 Nicotine dependence, cigarettes, uncomplicated: Secondary | ICD-10-CM | POA: Insufficient documentation

## 2021-03-15 DIAGNOSIS — R300 Dysuria: Secondary | ICD-10-CM | POA: Insufficient documentation

## 2021-03-15 DIAGNOSIS — N390 Urinary tract infection, site not specified: Secondary | ICD-10-CM

## 2021-03-15 DIAGNOSIS — N898 Other specified noninflammatory disorders of vagina: Secondary | ICD-10-CM | POA: Insufficient documentation

## 2021-03-15 DIAGNOSIS — A599 Trichomoniasis, unspecified: Secondary | ICD-10-CM

## 2021-03-15 LAB — CBC WITH DIFFERENTIAL/PLATELET
Abs Immature Granulocytes: 0 10*3/uL (ref 0.00–0.07)
Basophils Absolute: 0 10*3/uL (ref 0.0–0.1)
Basophils Relative: 1 %
Eosinophils Absolute: 0.2 10*3/uL (ref 0.0–0.5)
Eosinophils Relative: 3 %
HCT: 34.6 % — ABNORMAL LOW (ref 36.0–46.0)
Hemoglobin: 11.2 g/dL — ABNORMAL LOW (ref 12.0–15.0)
Immature Granulocytes: 0 %
Lymphocytes Relative: 47 %
Lymphs Abs: 2.7 10*3/uL (ref 0.7–4.0)
MCH: 30.3 pg (ref 26.0–34.0)
MCHC: 32.4 g/dL (ref 30.0–36.0)
MCV: 93.5 fL (ref 80.0–100.0)
Monocytes Absolute: 0.5 10*3/uL (ref 0.1–1.0)
Monocytes Relative: 8 %
Neutro Abs: 2.4 10*3/uL (ref 1.7–7.7)
Neutrophils Relative %: 41 %
Platelets: 247 10*3/uL (ref 150–400)
RBC: 3.7 MIL/uL — ABNORMAL LOW (ref 3.87–5.11)
RDW: 13.4 % (ref 11.5–15.5)
WBC: 5.8 10*3/uL (ref 4.0–10.5)
nRBC: 0 % (ref 0.0–0.2)

## 2021-03-15 LAB — URINALYSIS, ROUTINE W REFLEX MICROSCOPIC
Bilirubin Urine: NEGATIVE
Glucose, UA: NEGATIVE mg/dL
Hgb urine dipstick: NEGATIVE
Ketones, ur: NEGATIVE mg/dL
Nitrite: NEGATIVE
Protein, ur: NEGATIVE mg/dL
Specific Gravity, Urine: 1.011 (ref 1.005–1.030)
WBC, UA: >50 WBC/hpf — ABNORMAL HIGH (ref 0–5)
pH: 7 (ref 5.0–8.0)

## 2021-03-15 LAB — I-STAT CHEM 8, ED
BUN: 12 mg/dL (ref 8–23)
Calcium, Ion: 1.24 mmol/L (ref 1.15–1.40)
Chloride: 107 mmol/L (ref 98–111)
Creatinine, Ser: 0.6 mg/dL (ref 0.44–1.00)
Glucose, Bld: 125 mg/dL — ABNORMAL HIGH (ref 70–99)
HCT: 36 % (ref 36.0–46.0)
Hemoglobin: 12.2 g/dL (ref 12.0–15.0)
Potassium: 3.8 mmol/L (ref 3.5–5.1)
Sodium: 143 mmol/L (ref 135–145)
TCO2: 29 mmol/L (ref 22–32)

## 2021-03-15 LAB — WET PREP, GENITAL
Clue Cells Wet Prep HPF POC: NONE SEEN
Sperm: NONE SEEN
WBC, Wet Prep HPF POC: >=10 — AB (ref <10)
Yeast Wet Prep HPF POC: NONE SEEN

## 2021-03-15 MED ORDER — METRONIDAZOLE 500 MG PO TABS
1000.0000 mg | ORAL_TABLET | Freq: Once | ORAL | Status: AC
  Administered 2021-03-15: 1000 mg via ORAL
  Filled 2021-03-15: qty 2

## 2021-03-15 MED ORDER — ONDANSETRON HCL 4 MG/2ML IJ SOLN
4.0000 mg | Freq: Once | INTRAMUSCULAR | Status: AC
  Administered 2021-03-15: 4 mg via INTRAVENOUS
  Filled 2021-03-15: qty 2

## 2021-03-15 MED ORDER — SODIUM CHLORIDE 0.9 % IV SOLN
1.0000 g | Freq: Once | INTRAVENOUS | Status: AC
  Administered 2021-03-15: 22:00:00 1 g via INTRAVENOUS
  Filled 2021-03-15: qty 10

## 2021-03-15 MED ORDER — DOXYCYCLINE HYCLATE 100 MG PO CAPS: 100.0000 mg | ORAL_CAPSULE | Freq: Two times a day (BID) | ORAL | 0 refills | Status: DC

## 2021-03-15 MED ORDER — DOXYCYCLINE HYCLATE 100 MG PO TABS
100.0000 mg | ORAL_TABLET | Freq: Once | ORAL | Status: AC
  Administered 2021-03-15: 100 mg via ORAL
  Filled 2021-03-15: qty 1
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
## 2021-03-15 NOTE — ED Provider Notes (Signed)
Emergency Medicine Provider Triage Evaluation Note  Kaytee Taliercio , a 60 y.o. female  was evaluated in triage.  Pt complains of vaginal discharge.  States that vaginal discharge has been present over the last month.  Patient states that vaginal discharge has a foul odor.  Patient reports that she has not been sexually active in a month.   Review of Systems  Positive: Vaginal discharge, urinary frequency, diarrhea Negative: Hematuria, vaginal pain, pelvic pain  Physical Exam  BP 103/67    Pulse 78    Temp 98.9 F (37.2 C) (Oral)    Resp 16    SpO2 100%  Gen:   Awake, no distress   Resp:  Normal effort  MSK:   Moves extremities without difficulty  Other:  Abdomen soft, nondistended, nontender.  No guarding or rebound tenderness.  Medical Decision Making  Medically screening exam initiated at 7:06 PM.  Appropriate orders placed.  Denny Lave was informed that the remainder of the evaluation will be completed by another provider, this initial triage assessment does not replace that evaluation, and the importance of remaining in the ED until their evaluation is complete.     Berneice Heinrich 03/15/21 1907    Glendora Score, MD 03/15/21 2330
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
## 2021-03-15 NOTE — Discharge Instructions (Addendum)
Take doxycycline twice daily for a week.   You were treated for trichomonas   See your doctor   Return to ER if you have worse vaginal discharge, pelvic pain, fever.

## 2021-03-16 LAB — COMPREHENSIVE METABOLIC PANEL
ALT: 12 U/L (ref 0–44)
AST: 14 U/L — ABNORMAL LOW (ref 15–41)
Albumin: 3.1 g/dL — ABNORMAL LOW (ref 3.5–5.0)
Alkaline Phosphatase: 50 U/L (ref 38–126)
Anion gap: 6 (ref 5–15)
BUN: 11 mg/dL (ref 8–23)
CO2: 25 mmol/L (ref 22–32)
Calcium: 8.8 mg/dL — ABNORMAL LOW (ref 8.9–10.3)
Chloride: 110 mmol/L (ref 98–111)
Creatinine, Ser: 0.7 mg/dL (ref 0.44–1.00)
GFR, Estimated: >60 mL/min (ref >60)
Glucose, Bld: 135 mg/dL — ABNORMAL HIGH (ref 70–99)
Potassium: 4 mmol/L (ref 3.5–5.1)
Sodium: 141 mmol/L (ref 135–145)
Total Bilirubin: 0.2 mg/dL — ABNORMAL LOW (ref 0.3–1.2)
Total Protein: 5.7 g/dL — ABNORMAL LOW (ref 6.5–8.1)

## 2021-03-17 LAB — URINE CULTURE: Culture: <10000 — AB

## 2021-03-20 LAB — GC/CHLAMYDIA PROBE AMP (~~LOC~~) NOT AT ARMC
Chlamydia: NEGATIVE
Comment: NEGATIVE
Comment: NORMAL
Neisseria Gonorrhea: NEGATIVE

## 2021-03-22 ENCOUNTER — Emergency Department (HOSPITAL_COMMUNITY)
Admission: EM | Admit: 2021-03-22 | Discharge: 2021-03-23 | Disposition: A | Discharge status: Home / Self Care | Payer: Self-pay | Attending: Emergency Medicine | Admitting: Emergency Medicine

## 2021-03-22 ENCOUNTER — Encounter (HOSPITAL_COMMUNITY): Payer: Self-pay | Admitting: Emergency Medicine

## 2021-03-22 ENCOUNTER — Other Ambulatory Visit: Payer: Self-pay

## 2021-03-22 DIAGNOSIS — M791 Myalgia, unspecified site: Secondary | ICD-10-CM | POA: Insufficient documentation

## 2021-03-22 DIAGNOSIS — F17210 Nicotine dependence, cigarettes, uncomplicated: Secondary | ICD-10-CM | POA: Insufficient documentation

## 2021-03-22 DIAGNOSIS — M549 Dorsalgia, unspecified: Secondary | ICD-10-CM | POA: Insufficient documentation

## 2021-03-22 NOTE — ED Notes (Signed)
X2 no response °

## 2021-03-22 NOTE — ED Triage Notes (Signed)
Patient reports chronic low back pain for several years , denies injury /ambulatory , patient added chronic pain at both legs/feet and bilateral hips .

## 2021-03-22 NOTE — ED Notes (Signed)
X1 for vitals recheck no response

## 2021-03-22 NOTE — ED Provider Notes (Signed)
Emergency Medicine Provider Triage Evaluation Note  Betty Henderson , a 61 y.o. female  was evaluated in triage.  Pt complains of chronic low back pain ongoing for several years. Denies new injury, trauma.  She has not tried any medications for her symptoms.  Denies dysuria, hematuria, fever, chills, nausea, vomiting, abdominal pain  Review of Systems  Positive: Chronic low back pain Negative: Abdominal pain, nausea, vomiting  Physical Exam  BP 109/62    Pulse 83    Temp 98.9 F (37.2 C) (Oral)    Resp 16    Ht 5\' 6"  (1.676 m)    Wt 65 kg    SpO2 99%    BMI 23.13 kg/m  Gen:   Awake, no distress   Resp:  Normal effort  MSK:   Moves extremities without difficulty  Other:  No C, T, L, S spinal tenderness to palpation.  No lumbar musculature tenderness to palpation.  No abdominal tenderness to palpation.  Medical Decision Making  Medically screening exam initiated at 8:13 PM.  Appropriate orders placed.  Betty Henderson was informed that the remainder of the evaluation will be completed by another provider, this initial triage assessment does not replace that evaluation, and the importance of remaining in the ED until their evaluation is complete.   Betty Henderson A, PA-C 03/22/21 2014    03/24/21, MD 03/22/21 2234

## 2021-03-23 ENCOUNTER — Emergency Department (HOSPITAL_COMMUNITY)
Admission: EM | Admit: 2021-03-23 | Discharge: 2021-03-24 | Disposition: A | Discharge status: Home / Self Care | Payer: Self-pay | Attending: Emergency Medicine | Admitting: Emergency Medicine

## 2021-03-23 ENCOUNTER — Other Ambulatory Visit: Payer: Self-pay

## 2021-03-23 ENCOUNTER — Encounter (HOSPITAL_COMMUNITY): Payer: Self-pay | Admitting: Emergency Medicine

## 2021-03-23 DIAGNOSIS — R52 Pain, unspecified: Secondary | ICD-10-CM | POA: Insufficient documentation

## 2021-03-23 DIAGNOSIS — Z5321 Procedure and treatment not carried out due to patient leaving prior to being seen by health care provider: Secondary | ICD-10-CM | POA: Insufficient documentation

## 2021-03-23 NOTE — ED Provider Notes (Signed)
[facility] EMERGENCY DEPARTMENT Provider Note   CSN: [id number] Arrival date & time: 03/22/21  1842     History Chief Complaint  Patient presents with   Back Pain    Chronic/No Injury    Betty Henderson is a 60 y.o. female.  60 year old female that presents the emerged part today reportedly for back pain but when I talked to her she states that she has allover body aches has been going on for really long time and she does not know when this started.  She thinks it might be related to get old.  When I try to obtain more information she gets mad at me and tells me to "leave me the fuck alone you will make any fucking sense"..  She has a history of malingering and multiple episodes of musculoskeletal complaints without obvious injury or trauma.   Back Pain     Past Medical History:  Diagnosis Date   Anemia    Arthritis    Chronic pain    Drug-seeking behavior    Malingering    Osteopetrosis    Psychosis ([place])    Schizoaffective disorder, bipolar type ([place])     There are no problems to display for this patient.   Past Surgical History:  Procedure Laterality Date   MOUTH SURGERY     TUBAL LIGATION       OB History   No obstetric history on file.     Family History  Family history unknown: Yes    Social History   Tobacco Use   Smoking status: Some Days    Packs/day: 1.00    Types: Cigarettes   Smokeless tobacco: Never  Vaping Use   Vaping Use: Never used  Substance Use Topics   Alcohol use: Yes    Comment: occ   Drug use: Not Currently    Types: Cocaine    Comment: states "it's legal though"    Home Medications Prior to Admission medications   Medication Sig Start Date End Date Taking? Authorizing Provider  doxycycline (VIBRAMYCIN) 100 MG capsule Take 1 capsule (100 mg total) by mouth 2 (two) times daily. One po bid x 7 days 03/15/21   Drenda Freeze, MD  ranitidine (ZANTAC) 150 MG capsule Take 1 capsule (150 mg total) by  mouth daily. Patient not taking: Reported on 03/20/2018 05/31/17 01/07/19  Lacretia Leigh, MD    Allergies    Other, Lemon oil, Proanthocyanidin, Strawberry extract, Adhesive  [tape], and Cherry  Review of Systems   Review of Systems  Unable to perform ROS: Other  Musculoskeletal:  Positive for back pain.   Physical Exam Updated Vital Signs BP 123/64 (BP Location: Right Arm)    Pulse 74    Temp 98.9 F (37.2 C) (Oral)    Resp 16    Ht 5\' 6"  (1.676 m)    Wt 65 kg    SpO2 99%    BMI 23.13 kg/m   Physical Exam Vitals and nursing note reviewed.  Constitutional:      Appearance: She is well-developed.  HENT:     Head: Normocephalic and atraumatic.     Mouth/Throat:     Mouth: Mucous membranes are moist.     Pharynx: Oropharynx is clear.  Eyes:     Pupils: Pupils are equal, round, and reactive to light.  Cardiovascular:     Rate and Rhythm: Normal rate and regular rhythm.  Pulmonary:     Effort: Pulmonary effort is normal. No  respiratory distress.     Breath sounds: No stridor.  Abdominal:     General: Abdomen is flat. There is no distension.  Musculoskeletal:        General: No swelling, tenderness, deformity or signs of injury.     Cervical back: Normal range of motion.  Skin:    General: Skin is warm and dry.  Neurological:     General: No focal deficit present.     Mental Status: She is alert.    ED Results / Procedures / Treatments   Labs (all labs ordered are listed, but only abnormal results are displayed) Labs Reviewed - No data to display  EKG None  Radiology No results found.  Procedures Procedures   Medications Ordered in ED Medications - No data to display  ED Course  I have reviewed the triage vital signs and the nursing notes.  Pertinent labs & imaging results that were available during my care of the patient were reviewed by me and considered in my medical decision making (see chart for details).    MDM Rules/Calculators/A&P                          Patient does not fully cooperate with exam however is obvious that she has full use of her arms and legs.  She also has full use of her language abilities.  She refuses to let me examine her or speak with her further.  I suspect that she is likely malingering however she is refusing any history or exam there is not much else I can do for her.  She be discharged to follow-up with her primary care provider   Final Clinical Impression(s) / ED Diagnoses Final diagnoses:  Myalgia    Rx / DC Orders ED Discharge Orders     None        Jair Lindblad, Barbara Cower, MD 03/23/21 [phone]

## 2021-03-23 NOTE — ED Triage Notes (Signed)
Patient presents complaining of all over pain. Patient states she has a hx of arthritis. Patient seen recently for similar symptoms. Patient is ambulatory with a steady gait.

## 2021-03-23 NOTE — ED Provider Notes (Signed)
Emergency Medicine Provider Triage Evaluation Note  Betty Henderson , a 60 y.o. female  was evaluated in triage.  Pt complains of pain all over. Seen last night with a similar complaint. No new falls or injuries.  Physical Exam  BP 134/76 (BP Location: Right Arm)    Pulse 76    Temp 97.9 F (36.6 C) (Oral)    Resp 16    Ht 5\' 6"  (1.676 m)    Wt 63.5 kg    SpO2 100%    BMI 22.60 kg/m  Gen:   Awake, no distress   Resp:  Normal effort  MSK:   Moves extremities without difficulty  Other:    Medical Decision Making  Medically screening exam initiated at 10:26 PM.  Appropriate orders placed.  Betty Henderson was informed that the remainder of the evaluation will be completed by another provider, this initial triage assessment does not replace that evaluation, and the importance of remaining in the ED until their evaluation is complete.   Candie Chroman, PA-C 03/23/21 2227    2228, MD 03/24/21 [phone]

## 2021-05-01 ENCOUNTER — Other Ambulatory Visit: Payer: Self-pay

## 2021-05-01 ENCOUNTER — Encounter (HOSPITAL_BASED_OUTPATIENT_CLINIC_OR_DEPARTMENT_OTHER): Payer: Self-pay | Admitting: *Deleted

## 2021-05-01 ENCOUNTER — Emergency Department (HOSPITAL_BASED_OUTPATIENT_CLINIC_OR_DEPARTMENT_OTHER)
Admission: EM | Admit: 2021-05-01 | Discharge: 2021-05-01 | Disposition: A | Discharge status: Home / Self Care | Payer: Self-pay | Attending: Emergency Medicine | Admitting: Emergency Medicine

## 2021-05-01 ENCOUNTER — Encounter (HOSPITAL_BASED_OUTPATIENT_CLINIC_OR_DEPARTMENT_OTHER): Payer: Self-pay | Admitting: Emergency Medicine

## 2021-05-01 DIAGNOSIS — R197 Diarrhea, unspecified: Secondary | ICD-10-CM | POA: Insufficient documentation

## 2021-05-01 DIAGNOSIS — M25559 Pain in unspecified hip: Secondary | ICD-10-CM | POA: Insufficient documentation

## 2021-05-01 DIAGNOSIS — R456 Violent behavior: Secondary | ICD-10-CM | POA: Insufficient documentation

## 2021-05-01 DIAGNOSIS — R21 Rash and other nonspecific skin eruption: Secondary | ICD-10-CM | POA: Insufficient documentation

## 2021-05-01 NOTE — ED Triage Notes (Signed)
Pain in her left hip and back. She was seen her for same at 3:25 am. States she was allowed to sleep here last night.

## 2021-05-01 NOTE — ED Provider Notes (Signed)
[facility] EMERGENCY DEPARTMENT Provider Note   CSN: [id number] Arrival date & time: 05/01/21  0324     History  Chief Complaint  Patient presents with   Rash   Diarrhea    Betty Henderson is a 61 y.o. female.  61 year old female well-known to our department and multiple other departments in the area often comes in with nonspecific complaints which she does today as well.  Appears that she told triage nurse that she was having diarrhea and a rash on her leg.  These are chronic in nature on my evaluation she does not mention either 1 of these.  She states that she is cold and it is cold outside and she cannot wait to move to either [place], [place] or [place].  She also wants to celebrate [employer].  She thinks that she is going to [place] the end of this week and states she does want some more warm to stay until that time.  She also asked for some crackers and hot drink of some sort, specifically coffee or tea.  I offered to let her stay here for 2 hours unless we got more busy and at that time she had no other chief complaint and went to sleep.   Rash Associated symptoms: diarrhea   Diarrhea     Home Medications Prior to Admission medications   Medication Sig Start Date End Date Taking? Authorizing Provider  doxycycline (VIBRAMYCIN) 100 MG capsule Take 1 capsule (100 mg total) by mouth 2 (two) times daily. One po bid x 7 days 03/15/21   Charlynne Pander, MD  ranitidine (ZANTAC) 150 MG capsule Take 1 capsule (150 mg total) by mouth daily. Patient not taking: Reported on 03/20/2018 05/31/17 01/07/19  Lorre Nick, MD      Allergies    Other, Lemon oil, Proanthocyanidin, Strawberry extract, Adhesive  [tape], and Cherry    Review of Systems   Review of Systems  Gastrointestinal:  Positive for diarrhea.  Skin:  Positive for rash.   Physical Exam Updated Vital Signs BP (!) 112/55 (BP Location: Left Arm)    Pulse 68    Temp 98 F (36.7 C)  (Oral)    Resp 16    Ht 5\' 6"  (1.676 m)    Wt 63.5 kg    SpO2 96%    BMI 22.60 kg/m  Physical Exam Vitals and nursing note reviewed.  Constitutional:      Appearance: She is well-developed.  HENT:     Head: Normocephalic and atraumatic.     Mouth/Throat:     Mouth: Mucous membranes are moist.     Pharynx: Oropharynx is clear.  Eyes:     Pupils: Pupils are equal, round, and reactive to light.  Cardiovascular:     Rate and Rhythm: Normal rate and regular rhythm.  Pulmonary:     Effort: No respiratory distress.     Breath sounds: No stridor.  Abdominal:     General: Abdomen is flat. There is no distension.  Musculoskeletal:        General: No swelling or tenderness. Normal range of motion.     Cervical back: Normal range of motion.  Skin:    General: Skin is warm and dry.  Neurological:     General: No focal deficit present.     Mental Status: She is alert.    ED Results / Procedures / Treatments   Labs (all labs ordered are listed, but only abnormal results are displayed)  Labs Reviewed - No data to display  EKG None  Radiology No results found.  Procedures Procedures    Medications Ordered in ED Medications - No data to display  ED Course/ Medical Decision Making/ A&P                           Medical Decision Making  Malingering. No diarrhea while here. Allowed to sleep for 2.5 hours. Stable for discharge.    Final Clinical Impression(s) / ED Diagnoses Final diagnoses:  Diarrhea, unspecified type    Rx / DC Orders ED Discharge Orders     None         Jannie Doyle, Barbara Cower, MD 05/01/21 [phone]

## 2021-05-01 NOTE — ED Triage Notes (Signed)
Diarrhea X24 hour about 8 times and rash on right hip shingles?

## 2021-05-02 ENCOUNTER — Emergency Department (HOSPITAL_BASED_OUTPATIENT_CLINIC_OR_DEPARTMENT_OTHER)
Admission: EM | Admit: 2021-05-02 | Discharge: 2021-05-02 | Disposition: A | Discharge status: Home / Self Care | Payer: Self-pay | Attending: Emergency Medicine | Admitting: Emergency Medicine

## 2021-05-02 ENCOUNTER — Encounter (HOSPITAL_BASED_OUTPATIENT_CLINIC_OR_DEPARTMENT_OTHER): Payer: Self-pay | Admitting: Emergency Medicine

## 2021-05-02 DIAGNOSIS — R4689 Other symptoms and signs involving appearance and behavior: Secondary | ICD-10-CM

## 2021-05-02 DIAGNOSIS — M25559 Pain in unspecified hip: Secondary | ICD-10-CM

## 2021-05-02 NOTE — ED Notes (Signed)
Patient provided with discharge teaching and instructions.  Refused to have vital signs preformed at time of d/c.  Refused to sign d/c.  Patient rambling with head covered with blanket.

## 2021-05-02 NOTE — ED Notes (Signed)
ED Provider at bedside. 

## 2021-05-02 NOTE — ED Provider Notes (Signed)
[facility] EMERGENCY DEPARTMENT Provider Note   CSN: [id number] Arrival date & time: 05/01/21  1956     History  Chief Complaint  Patient presents with   Hip Pain    Betty Henderson is a 61 y.o. female.  The history is provided by the patient.  Hip Pain This is a new problem. The current episode started more than 2 days ago. The problem occurs constantly. The problem has not changed since onset.Pertinent negatives include no chest pain, no abdominal pain, no headaches and no shortness of breath. Nothing aggravates the symptoms. Nothing relieves the symptoms. She has tried nothing for the symptoms. The treatment provided no relief.  Patient with chronic pain and drug seeking behavior well known to this ED presents with ongoing hip pain.  Patient immediately begins curing at EDP stating no one is doing anything for her.     Past Medical History:  Diagnosis Date   Anemia    Arthritis    Chronic pain    Drug-seeking behavior    Malingering    Osteopetrosis    Psychosis ([place])    Schizoaffective disorder, bipolar type ([place])     Home Medications Prior to Admission medications   Medication Sig Start Date End Date Taking? Authorizing Provider  doxycycline (VIBRAMYCIN) 100 MG capsule Take 1 capsule (100 mg total) by mouth 2 (two) times daily. One po bid x 7 days 03/15/21   Drenda Freeze, MD  ranitidine (ZANTAC) 150 MG capsule Take 1 capsule (150 mg total) by mouth daily. Patient not taking: Reported on 03/20/2018 05/31/17 01/07/19  Lacretia Leigh, MD      Allergies    Other, Lemon oil, Proanthocyanidin, Strawberry extract, Adhesive  [tape], and Cherry    Review of Systems   Review of Systems  Constitutional:  Negative for fever.  HENT:  Negative for facial swelling.   Eyes:  Negative for redness.  Respiratory:  Negative for shortness of breath.   Cardiovascular:  Negative for chest pain.  Gastrointestinal:  Negative for abdominal pain.  Musculoskeletal:   Positive for arthralgias. Negative for neck pain.  Neurological:  Negative for headaches.  Psychiatric/Behavioral:  Negative for behavioral problems.   All other systems reviewed and are negative.  Physical Exam Updated Vital Signs BP (!) 119/32 (BP Location: Right Arm)    Pulse 73    Temp 98.2 F (36.8 C) (Oral)    Resp 20    Ht 5\' 6"  (1.676 m)    Wt 63.5 kg    BMI 22.60 kg/m  Physical Exam Vitals and nursing note reviewed.  Constitutional:      General: She is not in acute distress.    Appearance: Normal appearance.  HENT:     Head: Normocephalic and atraumatic.     Nose: Nose normal.  Eyes:     Conjunctiva/sclera: Conjunctivae normal.     Pupils: Pupils are equal, round, and reactive to light.  Cardiovascular:     Rate and Rhythm: Normal rate and regular rhythm.     Pulses: Normal pulses.     Heart sounds: Normal heart sounds.  Pulmonary:     Effort: Pulmonary effort is normal.     Breath sounds: Normal breath sounds.  Abdominal:     General: Abdomen is flat. Bowel sounds are normal.     Palpations: Abdomen is soft.     Tenderness: There is no abdominal tenderness. There is no guarding.  Musculoskeletal:        General: No  tenderness or deformity.     Cervical back: Normal, normal range of motion and neck supple. No tenderness.     Thoracic back: Normal.     Lumbar back: Normal.     Right hip: Normal.     Left hip: Normal.     Right lower leg: No edema.     Left lower leg: No edema.  Neurological:     Mental Status: She is alert.    ED Results / Procedures / Treatments   Labs (all labs ordered are listed, but only abnormal results are displayed) Labs Reviewed - No data to display  EKG None  Radiology No results found.  Procedures Procedures    Medications Ordered in ED Medications - No data to display  ED Course/ Medical Decision Making/ A&P                           Medical Decision Making Patient well known to this ED with multiple vague  complaints presents for a second night in a row for hip pain.  Patient begins cursing at patient immediately with history.    Risk Risk Details: Patient is cursing at [place] and then nurse.  FROM of the hips B.  I do not believe imaging is indicated at this time.  Given patient's behavior she is stable for discharge.      Final Clinical Impression(s) / ED Diagnoses Final diagnoses:  Hip pain  Aggressive behavior   Return for intractable cough, coughing up blood, fevers > 100.4 unrelieved by medication, shortness of breath, intractable vomiting, chest pain, shortness of breath, weakness, numbness, changes in speech, facial asymmetry, abdominal pain, passing out, Inability to tolerate liquids or food, cough, altered mental status or any concerns. No signs of systemic illness or infection. The patient is nontoxic-appearing on exam and vital signs are within normal limits.  I have reviewed the triage vital signs and the nursing notes. Pertinent labs & imaging results that were available during my care of the patient were reviewed by me and considered in my medical decision making (see chart for details). After history, exam, and medical workup I feel the patient has been appropriately medically screened and is safe for discharge home. Pertinent diagnoses were discussed with the patient. Patient was given return precautions. Rx / DC Orders ED Discharge Orders     None         Addisynn Vassell, MD 05/02/21 [id number]

## 2021-05-13 ENCOUNTER — Other Ambulatory Visit: Payer: Self-pay

## 2021-05-13 ENCOUNTER — Emergency Department (HOSPITAL_BASED_OUTPATIENT_CLINIC_OR_DEPARTMENT_OTHER)
Admission: EM | Admit: 2021-05-13 | Discharge: 2021-05-13 | Disposition: A | Discharge status: Home / Self Care | Payer: Self-pay | Attending: Emergency Medicine | Admitting: Emergency Medicine

## 2021-05-13 ENCOUNTER — Encounter (HOSPITAL_BASED_OUTPATIENT_CLINIC_OR_DEPARTMENT_OTHER): Payer: Self-pay | Admitting: Emergency Medicine

## 2021-05-13 DIAGNOSIS — M79604 Pain in right leg: Secondary | ICD-10-CM

## 2021-05-13 DIAGNOSIS — M79662 Pain in left lower leg: Secondary | ICD-10-CM | POA: Insufficient documentation

## 2021-05-13 DIAGNOSIS — M79661 Pain in right lower leg: Secondary | ICD-10-CM | POA: Insufficient documentation

## 2021-05-13 MED ORDER — ACETAMINOPHEN 325 MG PO TABS
650.0000 mg | ORAL_TABLET | Freq: Once | ORAL | Status: AC
  Administered 2021-05-13: 650 mg via ORAL
  Filled 2021-05-13: qty 2

## 2021-05-13 NOTE — ED Provider Notes (Signed)
[facility] EMERGENCY DEPARTMENT Provider Note   CSN: [id number] Arrival date & time: 05/13/21  1854     History  Chief Complaint  Patient presents with   Leg Pain    Betty Henderson is a 61 y.o. female.   Leg Pain  Patient well-known to the ED with medical history of documented malingering, bilateral lower extremity pain, unstable housing due to bilateral lower extremity pain.  States it hurts when she walks, denies any trauma.  No paresthesias, no chest pain or shortness of breath.  Patient has been seen 14 times in the last 19 days for bilateral leg pain or hip pain.  Home Medications Prior to Admission medications   Medication Sig Start Date End Date Taking? Authorizing Provider  doxycycline (VIBRAMYCIN) 100 MG capsule Take 1 capsule (100 mg total) by mouth 2 (two) times daily. One po bid x 7 days 03/15/21   Charlynne Pander, MD  ranitidine (ZANTAC) 150 MG capsule Take 1 capsule (150 mg total) by mouth daily. Patient not taking: Reported on 03/20/2018 05/31/17 01/07/19  Lorre Nick, MD      Allergies    Other, Lemon oil, Proanthocyanidin, Strawberry extract, Adhesive  [tape], and Cherry    Review of Systems   Review of Systems  Physical Exam Updated Vital Signs BP 95/69 (BP Location: Right Arm)    Pulse 85    Temp 97.6 F (36.4 C) (Oral)    Resp 18    Ht 5\' 6"  (1.676 m)    Wt 60.8 kg    SpO2 100%    BMI 21.63 kg/m  Physical Exam Vitals and nursing note reviewed. Exam conducted with a chaperone present.  Constitutional:      General: She is not in acute distress.    Appearance: Normal appearance.     Comments: Disheveled, resting comfortably no acute distress  HENT:     Head: Normocephalic and atraumatic.  Eyes:     General: No scleral icterus.    Extraocular Movements: Extraocular movements intact.     Pupils: Pupils are equal, round, and reactive to light.  Musculoskeletal:        General: No tenderness. Normal range of motion.  Skin:     Capillary Refill: Capillary refill takes less than 2 seconds.     Coloration: Skin is not jaundiced.  Neurological:     Mental Status: She is alert. Mental status is at baseline.     Coordination: Coordination normal.    ED Results / Procedures / Treatments   Labs (all labs ordered are listed, but only abnormal results are displayed) Labs Reviewed - No data to display  EKG None  Radiology No results found.  Procedures Procedures    Medications Ordered in ED Medications  acetaminophen (TYLENOL) tablet 650 mg (has no administration in time range)    ED Course/ Medical Decision Making/ A&P                           Medical Decision Making Risk OTC drugs.   Patient presents with bilateral lower extremity pain which has a wide differential diagnosis.  Highest in the differential is chronic pain and secondary gain from unstable housing demands.    BP 95/69 (BP Location: Right Arm)    Pulse 85    Temp 97.6 F (36.4 C) (Oral)    Resp 18    Ht 5\' 6"  (1.676 m)    Wt 60.8 kg  SpO2 100%    BMI 21.63 kg/m   No point tenderness, patient is ambulatory.  DP and PT 2+, tolerates range.  Given Tylenol.  Considered but think unlikely occult fracture, her chronic osteoarthritis, avascular necrosis, septic joint, bilateral DVT, and other   Additionally, did consider ordering imaging but based on my chart review she is seen here frequently and this appears to be more of a chronic concern.  Patient's lack of stable housing is a contributing factor for determinants of health.  DX: Bilateral lower extremity pain, unstable housing  Patient stable for discharge.  Provided resources for shelters        Final Clinical Impression(s) / ED Diagnoses Final diagnoses:  Bilateral leg pain    Rx / DC Orders ED Discharge Orders     None         Theron Arista, PA-C 05/13/21 2250    Charlynne Pander, MD 05/14/21 [phone]

## 2021-05-13 NOTE — Discharge Instructions (Signed)
Rest. Take tylenol for pain.

## 2021-05-13 NOTE — ED Triage Notes (Signed)
Reports leg pain due to walking all the time.  Brought by ems.  Ambulatory to triage.
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
## 2021-05-13 NOTE — ED Notes (Signed)
[employer] called for pt. Voucher will be provided; ETA is 25 minutes

## 2021-05-24 ENCOUNTER — Other Ambulatory Visit: Payer: Self-pay

## 2021-05-24 ENCOUNTER — Emergency Department (HOSPITAL_COMMUNITY)
Admission: EM | Admit: 2021-05-24 | Discharge: 2021-05-25 | Discharge status: Against Medical Advice | Payer: Self-pay | Attending: Emergency Medicine | Admitting: Emergency Medicine

## 2021-05-24 DIAGNOSIS — M25552 Pain in left hip: Secondary | ICD-10-CM | POA: Insufficient documentation

## 2021-05-24 DIAGNOSIS — M79604 Pain in right leg: Secondary | ICD-10-CM | POA: Insufficient documentation

## 2021-05-24 DIAGNOSIS — Z5321 Procedure and treatment not carried out due to patient leaving prior to being seen by health care provider: Secondary | ICD-10-CM | POA: Insufficient documentation

## 2021-05-24 DIAGNOSIS — M79605 Pain in left leg: Secondary | ICD-10-CM | POA: Insufficient documentation

## 2021-05-24 DIAGNOSIS — M25551 Pain in right hip: Secondary | ICD-10-CM | POA: Insufficient documentation

## 2021-05-24 NOTE — ED Notes (Signed)
No answer

## 2021-05-24 NOTE — ED Provider Triage Note (Signed)
Emergency Medicine Provider Triage Evaluation Note ? ?Betty Henderson , a 61 y.o. female  was evaluated in triage.  Pt complains of bilateral leg weakness and hip pains.  Patient has been walking more and feels she may have overused her legs.  No obvious injury or deformity.   ? ?Review of Systems  ?Positive: Leg pains ?Negative: Trauma ? ?Physical Exam  ?BP 121/60   Pulse 73   Temp 98.7 ?F (37.1 ?C) (Oral)   Resp 16   SpO2 99%  ?Gen:   Awake, no distress   ?Resp:  Normal effort  ?MSK:   Moves extremities without difficulty  ?Other:  Pulses 2+ bilaterally, no obvious deformity on LEs ? ?Medical Decision Making  ?Medically screening exam initiated at 7:42 PM.  Appropriate orders placed.  Betty Henderson was informed that the remainder of the evaluation will be completed by another provider, this initial triage assessment does not replace that evaluation, and the importance of remaining in the ED until their evaluation is complete. ? ?  ?Cecil Cobbs, PA-C ?05/24/21 1948 ? ?

## 2021-05-24 NOTE — ED Triage Notes (Signed)
Pt here for bilateral leg "aches" and bilateral hip pains. Pt states she walks a lot and has had some falls since she lost her cane. Pt ambulatory to triage, no obvious injury/deformity. ?

## 2021-05-25 ENCOUNTER — Encounter (HOSPITAL_COMMUNITY): Payer: Self-pay | Admitting: *Deleted

## 2021-05-25 ENCOUNTER — Emergency Department (HOSPITAL_COMMUNITY)
Admission: EM | Admit: 2021-05-25 | Discharge: 2021-05-25 | Disposition: A | Discharge status: Home / Self Care | Payer: Self-pay | Attending: Emergency Medicine | Admitting: Emergency Medicine

## 2021-05-25 DIAGNOSIS — R5383 Other fatigue: Secondary | ICD-10-CM | POA: Insufficient documentation

## 2021-05-25 DIAGNOSIS — R519 Headache, unspecified: Secondary | ICD-10-CM | POA: Insufficient documentation

## 2021-05-25 DIAGNOSIS — R531 Weakness: Secondary | ICD-10-CM | POA: Insufficient documentation

## 2021-05-25 DIAGNOSIS — R69 Illness, unspecified: Secondary | ICD-10-CM

## 2021-05-25 DIAGNOSIS — M791 Myalgia, unspecified site: Secondary | ICD-10-CM | POA: Insufficient documentation

## 2021-05-25 MED ORDER — IBUPROFEN 400 MG PO TABS
600.0000 mg | ORAL_TABLET | Freq: Once | ORAL | Status: AC
  Administered 2021-05-25: 600 mg via ORAL
  Filled 2021-05-25: qty 1

## 2021-05-25 MED ORDER — IBUPROFEN 600 MG PO TABS: 600.0000 mg | ORAL_TABLET | Freq: Four times a day (QID) | ORAL | 0 refills | Status: DC | PRN

## 2021-05-25 NOTE — ED Provider Notes (Signed)
?[facility] EMERGENCY DEPARTMENT ?Provider Note ? ? ?CSN: [id number] ?Arrival date & time: 05/25/21  1036 ? ?  ? ?History ?No chief complaint on file. ? ? ?Betty Henderson is a 61 y.o. female presenting due to the complaint of not feeling well.  Reports she was seen at [facility] and they gave her Tylenol which did not do too much.  Endorsing body aches, headaches, weakness and fatigue.  No known sick contacts.  Has not done any at home COVID testing however reports being vaccinated x1 ? ? ?Home Medications ?Prior to Admission medications   ?Medication Sig Start Date End Date Taking? Authorizing Provider  ?doxycycline (VIBRAMYCIN) 100 MG capsule Take 1 capsule (100 mg total) by mouth 2 (two) times daily. One po bid x 7 days 03/15/21   Charlynne Pander, MD  ?ibuprofen (ADVIL) 600 MG tablet Take 1 tablet (600 mg total) by mouth every 6 (six) hours as needed. 05/25/21   Gwen Sarvis A, PA-C  ?ranitidine (ZANTAC) 150 MG capsule Take 1 capsule (150 mg total) by mouth daily. ?Patient not taking: Reported on 03/20/2018 05/31/17 01/07/19  Lorre Nick, MD  ?   ? ?Allergies    ?Other, Lemon oil, Proanthocyanidin, Strawberry extract, Adhesive  [tape], and Cherry   ? ?Review of Systems   ?Review of Systems ? ?Physical Exam ?Updated Vital Signs ?BP 95/61   Pulse 76   Temp 98.6 ?F (37 ?C) (Oral)   Resp 20   SpO2 100%  ?Physical Exam ?Vitals and nursing note reviewed.  ?Constitutional:   ?   Appearance: Normal appearance. She is ill-appearing (Chronically ill-appearing).  ?HENT:  ?   Head: Normocephalic and atraumatic.  ?   Mouth/Throat:  ?   Mouth: Mucous membranes are moist.  ?   Pharynx: Oropharynx is clear. Posterior oropharyngeal erythema present. No oropharyngeal exudate.  ?Eyes:  ?   General: No scleral icterus. ?   Conjunctiva/sclera: Conjunctivae normal.  ?Cardiovascular:  ?   Rate and Rhythm: Normal rate and regular rhythm.  ?Pulmonary:  ?   Effort: Pulmonary effort is normal. No  respiratory distress.  ?   Breath sounds: No wheezing.  ?Skin: ?   General: Skin is warm and dry.  ?   Findings: No rash.  ?Neurological:  ?   Mental Status: She is alert.  ?Psychiatric:     ?   Mood and Affect: Mood normal.  ? ? ?ED Results / Procedures / Treatments   ?Labs ?(all labs ordered are listed, but only abnormal results are displayed) ?Labs Reviewed  ?RESP PANEL BY RT-PCR (FLU A&B, COVID) ARPGX2  ? ? ?EKG ?None ? ?Radiology ?No results found. ? ?Procedures ?Procedures  ? ? ?Medications Ordered in ED ?Medications  ?ibuprofen (ADVIL) tablet 600 mg (600 mg Oral Given 05/25/21 1135)  ? ? ?ED Course/ Medical Decision Making/ A&P ?  ?                        ?Medical Decision Making ?Risk ?Prescription drug management. ? ? ?61 year old female assessed in triage.  Has had multiple visits over the last few months.  History of homelessness.  Patient had a urinalysis done yesterday without abnormalities.  COVID swab today saying "I am allergic to this." ? ?She was given ibuprofen.  Discharged with a printed prescription for this.  Agreeable to discharge, requesting a bus pass. ? ? ?Final Clinical Impression(s) / ED Diagnoses ?Final diagnoses:  ?Sick  ? ? ?  Rx / DC Orders ?ED Discharge Orders   ? ?      Ordered  ?  ibuprofen (ADVIL) 600 MG tablet  Every 6 hours PRN,   Status:  Discontinued       ? 05/25/21 1134  ?  ibuprofen (ADVIL) 600 MG tablet  Every 6 hours PRN,   Status:  Discontinued       ? 05/25/21 1136  ?  ibuprofen (ADVIL) 600 MG tablet  Every 6 hours PRN       ? 05/25/21 1138  ? ?  ?  ? ?  ? ?Results and diagnoses were explained to the patient. Return precautions discussed in full. Patient had no additional questions and expressed complete understanding. ? ? ?This chart was dictated using voice recognition software.  Despite best efforts to proofread,  errors can occur which can change the documentation meaning.  ?  ?Saddie Benders, PA-C ?05/25/21 1224 ? ?  ?Pollyann Savoy, MD ?05/25/21 1511 ? ?

## 2021-05-25 NOTE — ED Notes (Signed)
Refused covid swab.

## 2021-05-25 NOTE — ED Triage Notes (Signed)
Pt has no specific complaints, just reports being "sick". No distress noted. ?

## 2021-05-25 NOTE — Discharge Instructions (Signed)
You may take the written prescription for ibuprofen to the [facility] that you are hoping to get it from.  If not, ibuprofen is sold over-the-counter at any pharmacy, gas station, [place] or grocery store. ? ?Be sure to stay hydrated.  Follow-up with your primary care provider if you continue to have symptoms past the next 5 days ?

## 2021-05-26 ENCOUNTER — Emergency Department (HOSPITAL_COMMUNITY)
Admission: EM | Admit: 2021-05-26 | Discharge: 2021-05-27 | Disposition: A | Discharge status: Home / Self Care | Payer: Self-pay | Attending: Emergency Medicine | Admitting: Emergency Medicine

## 2021-05-26 ENCOUNTER — Other Ambulatory Visit: Payer: Self-pay

## 2021-05-26 DIAGNOSIS — M25552 Pain in left hip: Secondary | ICD-10-CM | POA: Insufficient documentation

## 2021-05-26 DIAGNOSIS — R5383 Other fatigue: Secondary | ICD-10-CM | POA: Insufficient documentation

## 2021-05-26 DIAGNOSIS — R531 Weakness: Secondary | ICD-10-CM | POA: Insufficient documentation

## 2021-05-26 DIAGNOSIS — M549 Dorsalgia, unspecified: Secondary | ICD-10-CM | POA: Insufficient documentation

## 2021-05-26 DIAGNOSIS — G8929 Other chronic pain: Secondary | ICD-10-CM | POA: Insufficient documentation

## 2021-05-26 DIAGNOSIS — R519 Headache, unspecified: Secondary | ICD-10-CM | POA: Insufficient documentation

## 2021-05-26 MED ORDER — ACETAMINOPHEN 500 MG PO TABS
1000.0000 mg | ORAL_TABLET | Freq: Once | ORAL | Status: AC
  Administered 2021-05-26: 1000 mg via ORAL
  Filled 2021-05-26: qty 2

## 2021-05-26 NOTE — ED Triage Notes (Signed)
Patient BIB EMS c/o back pain. Pt stated she has slip disk and wants to have operation on her back. ?

## 2021-05-26 NOTE — Discharge Instructions (Signed)
Please follow up with your PCP regarding your chronic pain. Take Ibuprofen or Tylenol as needed for pain.  ? ?Return to the ED for any new/worsening symptoms ?

## 2021-05-26 NOTE — ED Provider Notes (Signed)
?[facility]-EMERGENCY DEPT ?Provider Note ? ? ?CSN: [id number] ?Arrival date & time: 05/26/21  2256 ? ?  ? ?History ? ?Chief Complaint  ?Patient presents with  ? Back Pain  ? ? ?Betty Henderson is a 61 y.o. female with PMHx chronic pain, malingering, and schizoaffective disorder who presents to the ED today via EMS with complaint of back pain.  Per triage report patient stated to EMS that she has a slipped disc and wants to have operation on her back.  She then told triage staff a different story and tells me she is here for "pain all over" and specifically left hip pain.  Per chart review patient has multiple ED visits in the past for chronic left hip pain.  She was incidentally seen yesterday as well at [facility] with complaint of pain all over including headaches, weakness, fatigue.  She declined COVID testing at that time and was discharged after dose of ibuprofen.  She has had 16 ED visits in 6 months within our system with additional ED visits at [facility].  Patient denies any new trauma to her left hip.  States that she is not taking anything for pain as she has "not gotten any here yet."  She then asks if she is at [facility].  When I tell her that she is at [facility] long emergency department and discuss plan of action she becomes irritated and starts cursing.  ? ?The history is provided by the patient, medical records and the EMS personnel.  ? ?  ? ?Home Medications ?Prior to Admission medications   ?Medication Sig Start Date End Date Taking? Authorizing Provider  ?doxycycline (VIBRAMYCIN) 100 MG capsule Take 1 capsule (100 mg total) by mouth 2 (two) times daily. One po bid x 7 days 03/15/21   Charlynne Pander, MD  ?ibuprofen (ADVIL) 600 MG tablet Take 1 tablet (600 mg total) by mouth every 6 (six) hours as needed. 05/25/21   Redwine, Madison A, PA-C  ?ranitidine (ZANTAC) 150 MG capsule Take 1 capsule (150 mg total) by mouth daily. ?Patient not taking: Reported on  03/20/2018 05/31/17 01/07/19  Lorre Nick, MD  ?   ? ?Allergies    ?Other, Lemon oil, Proanthocyanidin, Strawberry extract, Adhesive  [tape], and Cherry   ? ?Review of Systems   ?Review of Systems  ?Musculoskeletal:  Positive for arthralgias.  ?Neurological:  Negative for weakness and numbness.  ?All other systems reviewed and are negative. ? ?Physical Exam ?Updated Vital Signs ?BP 127/84 (BP Location: Left Arm)   Pulse 66   Temp 97.7 ?F (36.5 ?C) (Oral)   Resp 18   Ht 5\' 6"  (1.676 m)   Wt 60.8 kg   SpO2 100%   BMI 21.63 kg/m?  ?Physical Exam ?Vitals and nursing note reviewed.  ?Constitutional:   ?   Appearance: She is not ill-appearing.  ?HENT:  ?   Head: Normocephalic and atraumatic.  ?Eyes:  ?   Conjunctiva/sclera: Conjunctivae normal.  ?Cardiovascular:  ?   Rate and Rhythm: Normal rate and regular rhythm.  ?Pulmonary:  ?   Effort: Pulmonary effort is normal.  ?   Breath sounds: Normal breath sounds.  ?Musculoskeletal:  ?   Comments: Moving all extremities without difficulty. No overlying skin changes noted to L hip. ROM intact to left hip/pt able to flex and extend without difficulty. Mild TTP to anterior aspect of L hip. No crepitus. Strength 5/5 to LLE. Sensation intact throughout. 2+ DP pulse.   ?  Skin: ?   General: Skin is warm and dry.  ?   Coloration: Skin is not jaundiced.  ?Neurological:  ?   Mental Status: She is alert.  ? ? ?ED Results / Procedures / Treatments   ?Labs ?(all labs ordered are listed, but only abnormal results are displayed) ?Labs Reviewed - No data to display ? ?EKG ?None ? ?Radiology ?No results found. ? ?Procedures ?Procedures  ? ? ?Medications Ordered in ED ?Medications  ?acetaminophen (TYLENOL) tablet 1,000 mg (has no administration in time range)  ? ? ?ED Course/ Medical Decision Making/ A&P ?  ?                        ?Medical Decision Making ?61 year old female who presents to the ED today with complaint of chronic left hip pain.  Denies any new traumas or falls.  Came by  ambulance.  Seen yesterday at [facility] for unrelated issue.  Has had multiple ED visits in the past for chronic left hip pain.  On arrival to the ED vitals are stable.  Patient is afebrile, nontachycardic and nontachypneic and appears to be in no acute distress.  She is seen resting comfortably as I enter the room.  She is moving all extremities without difficulty.  She has mild tenderness palpation to the left hip however range of motion intact and strength and sensation equal bilaterally.  She is neurovascular intact throughout.  Patient becomes irritated with me asking questions regarding her ED visit today.  She then begins cursing at me and states she wishes she was at [facility].  Given no new traumas or falls I do not feel she quires repeat imaging at this time.  Will provide Tylenol and discharged home.  ? ?Problems Addressed: ?Chronic left hip pain: acute illness or injury ? ?Amount and/or Complexity of Data Reviewed ?Radiology:  ?   Details: N/A ? ?Risk ?OTC drugs. ? ? ? ? ? ? ? ? ? ?Final Clinical Impression(s) / ED Diagnoses ?Final diagnoses:  ?Chronic left hip pain  ? ? ?Rx / DC Orders ?ED Discharge Orders   ? ? None  ? ?  ? ? ? ?Discharge Instructions   ? ?  ?Please follow up with your PCP regarding your chronic pain. Take Ibuprofen or Tylenol as needed for pain.  ? ?Return to the ED for any new/worsening symptoms ? ? ? ? ?  ?Tanda Rockers, PA-C ?05/26/21 2336 ? ?  ?Geoffery Lyons, MD ?05/26/21 2339 ? ?

## 2021-05-27 ENCOUNTER — Emergency Department (HOSPITAL_COMMUNITY)
Admission: EM | Admit: 2021-05-27 | Discharge: 2021-05-27 | Disposition: A | Discharge status: Home / Self Care | Payer: Self-pay | Attending: Emergency Medicine | Admitting: Emergency Medicine

## 2021-05-27 DIAGNOSIS — M79604 Pain in right leg: Secondary | ICD-10-CM

## 2021-05-27 DIAGNOSIS — G8929 Other chronic pain: Secondary | ICD-10-CM | POA: Insufficient documentation

## 2021-05-27 DIAGNOSIS — M79605 Pain in left leg: Secondary | ICD-10-CM

## 2021-05-27 MED ORDER — IBUPROFEN 800 MG PO TABS
800.0000 mg | ORAL_TABLET | Freq: Once | ORAL | Status: AC
  Administered 2021-05-27: 800 mg via ORAL
  Filled 2021-05-27: qty 1
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
## 2021-05-27 NOTE — ED Triage Notes (Signed)
Pt c/o pain "everywhere," unwilling to provide more history/ answer more questions, mumbling to herself. NAD in triage, ambulatory, RR even, unlabored. ?

## 2021-05-27 NOTE — Discharge Instructions (Signed)
Take Tylenol or Motrin for pain. ? ?See your doctor for follow-up ? ?Return to ER if you have worse leg pain, fall ?

## 2021-05-27 NOTE — ED Notes (Signed)
Patient given coffee , sandwich and apple sauce.  ?

## 2021-05-27 NOTE — ED Notes (Signed)
Patient given sandwichwith coffee.  ?

## 2021-05-27 NOTE — ED Notes (Signed)
EDP at bedside  
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
## 2021-06-07 ENCOUNTER — Other Ambulatory Visit: Payer: Self-pay

## 2021-06-07 ENCOUNTER — Encounter (HOSPITAL_BASED_OUTPATIENT_CLINIC_OR_DEPARTMENT_OTHER): Payer: Self-pay | Admitting: Emergency Medicine

## 2021-06-07 DIAGNOSIS — M79604 Pain in right leg: Secondary | ICD-10-CM | POA: Insufficient documentation

## 2021-06-07 DIAGNOSIS — G8929 Other chronic pain: Secondary | ICD-10-CM | POA: Insufficient documentation

## 2021-06-07 DIAGNOSIS — M79605 Pain in left leg: Secondary | ICD-10-CM | POA: Insufficient documentation

## 2021-06-07 DIAGNOSIS — M791 Myalgia, unspecified site: Secondary | ICD-10-CM | POA: Insufficient documentation

## 2021-06-07 NOTE — ED Triage Notes (Signed)
Pt reports "aches and pains" x 7 years.  ?

## 2021-06-07 NOTE — ED Notes (Signed)
Called patient for triage, no response. Pt was found at the snack machine. Pt informed it was time for triage. Pt stated she needed to get a snack first. ?

## 2021-06-08 ENCOUNTER — Emergency Department (HOSPITAL_BASED_OUTPATIENT_CLINIC_OR_DEPARTMENT_OTHER)
Admission: EM | Admit: 2021-06-08 | Discharge: 2021-06-08 | Disposition: A | Discharge status: Home / Self Care | Payer: Self-pay | Attending: Emergency Medicine | Admitting: Emergency Medicine

## 2021-06-08 ENCOUNTER — Encounter (HOSPITAL_BASED_OUTPATIENT_CLINIC_OR_DEPARTMENT_OTHER): Payer: Self-pay | Admitting: Emergency Medicine

## 2021-06-08 DIAGNOSIS — G8929 Other chronic pain: Secondary | ICD-10-CM

## 2021-06-08 NOTE — Discharge Instructions (Addendum)
Please follow-up with your family doctor for repeat evaluation.  Get rechecked sooner if you have any new or concerning symptoms.  You may take over-the-counter pain relievers such as acetaminophen or ibuprofen according to label instructions as needed for pain. ?

## 2021-06-08 NOTE — ED Provider Notes (Signed)
?[facility] EMERGENCY DEPARTMENT ?Provider Note ? ? ?CSN: [id number] ?Arrival date & time: 06/07/21  2221 ? ?  ? ?History ? ?Chief Complaint  ?Patient presents with  ? Generalized Body Aches  ? ? ?Betty Henderson is a 61 y.o. female. ? ?The history is provided by the patient and medical records.  ?Betty Henderson is a 61 y.o. female who presents to the Emergency Department complaining of bilateral leg pain.  She presents to the emergency department complaining of bilateral leg pain that has been present for the last 7 years.  No new injuries.  No fevers, vomiting.  No numbness.  She states she has no known medical problems and uses no drugs or alcohol. ?  ? ?Home Medications ?Prior to Admission medications   ?Medication Sig Start Date End Date Taking? Authorizing Provider  ?doxycycline (VIBRAMYCIN) 100 MG capsule Take 1 capsule (100 mg total) by mouth 2 (two) times daily. One po bid x 7 days 03/15/21   Charlynne Pander, MD  ?ibuprofen (ADVIL) 600 MG tablet Take 1 tablet (600 mg total) by mouth every 6 (six) hours as needed. 05/25/21   Redwine, Madison A, PA-C  ?ranitidine (ZANTAC) 150 MG capsule Take 1 capsule (150 mg total) by mouth daily. ?Patient not taking: Reported on 03/20/2018 05/31/17 01/07/19  Lorre Nick, MD  ?   ? ?Allergies    ?Other, Lemon oil, Proanthocyanidin, Strawberry extract, Adhesive  [tape], and Cherry   ? ?Review of Systems   ?Review of Systems  ?All other systems reviewed and are negative. ? ?Physical Exam ?Updated Vital Signs ?BP 113/67 (BP Location: Right Arm)   Pulse 85   Temp (!) 97.5 ?F (36.4 ?C) (Oral)   Resp 18   Ht 5\' 6"  (1.676 m)   Wt 60 kg   SpO2 97%   BMI 21.35 kg/m?  ?Physical Exam ?Vitals and nursing note reviewed.  ?Constitutional:   ?   Appearance: She is well-developed.  ?HENT:  ?   Head: Normocephalic and atraumatic.  ?Cardiovascular:  ?   Rate and Rhythm: Normal rate and regular rhythm.  ?Pulmonary:  ?   Effort: Pulmonary effort is normal. No  respiratory distress.  ?Abdominal:  ?   Palpations: Abdomen is soft.  ?   Tenderness: There is no abdominal tenderness. There is no guarding or rebound.  ?Musculoskeletal:     ?   General: No tenderness.  ?   Comments: 2+ DP pulses bilaterally.  No soft tissue swelling or tenderness to bilateral lower extremities.  Range of motion intact at the hips, knees, ankles bilaterally.  ?Skin: ?   General: Skin is warm and dry.  ?Neurological:  ?   Mental Status: She is alert and oriented to person, place, and time.  ?   Comments: 5 out of 5 strength in bilateral lower extremities with sensation to light touch intact in bilateral lower extremities  ?Psychiatric:  ?   Comments: Disorganized thought process.  No SI, HI.  Tangential.  ? ? ?ED Results / Procedures / Treatments   ?Labs ?(all labs ordered are listed, but only abnormal results are displayed) ?Labs Reviewed - No data to display ? ?EKG ?None ? ?Radiology ?No results found. ? ?Procedures ?Procedures  ? ? ?Medications Ordered in ED ?Medications - No data to display ? ?ED Course/ Medical Decision Making/ A&P ?  ?                        ?  Medical Decision Making ? ?Patient with history of chronic pain, paranoid schizophrenia, housing insecurity here for evaluation of 7 years of bilateral leg pain.  She is disorganized in history giving but in no acute distress.  Bilateral lower extremities are warm and well-perfused with no evidence of acute infectious process.  No evidence of acute neurologic deficit.  On record review she has multiple ED visits to multiple different departments.  Today is the ninth visit in the last 17 days.  There is no evidence of acute medical emergency at this point in time.  Plan to discharge with outpatient resources. ? ? ? ? ? ? ? ?Final Clinical Impression(s) / ED Diagnoses ?Final diagnoses:  ?Other chronic pain  ? ? ?Rx / DC Orders ?ED Discharge Orders   ? ? None  ? ?  ? ? ?  ?Tilden Fossa, MD ?06/08/21 [id number] ? ?

## 2021-06-08 NOTE — ED Notes (Signed)
Pt does not want VS taken at this time. Pt given blanket, meal and coffee per request.  ?

## 2021-06-09 ENCOUNTER — Encounter (HOSPITAL_BASED_OUTPATIENT_CLINIC_OR_DEPARTMENT_OTHER): Payer: Self-pay | Admitting: *Deleted

## 2021-06-09 ENCOUNTER — Emergency Department (HOSPITAL_BASED_OUTPATIENT_CLINIC_OR_DEPARTMENT_OTHER)
Admission: EM | Admit: 2021-06-09 | Discharge: 2021-06-10 | Disposition: A | Discharge status: Home / Self Care | Payer: Self-pay | Attending: Emergency Medicine | Admitting: Emergency Medicine

## 2021-06-09 ENCOUNTER — Other Ambulatory Visit: Payer: Self-pay

## 2021-06-09 DIAGNOSIS — G8929 Other chronic pain: Secondary | ICD-10-CM | POA: Insufficient documentation

## 2021-06-09 DIAGNOSIS — M25552 Pain in left hip: Secondary | ICD-10-CM | POA: Insufficient documentation

## 2021-06-09 DIAGNOSIS — M25551 Pain in right hip: Secondary | ICD-10-CM | POA: Insufficient documentation

## 2021-06-09 MED ORDER — ACETAMINOPHEN 500 MG PO TABS
1000.0000 mg | ORAL_TABLET | Freq: Once | ORAL | Status: AC
  Administered 2021-06-09: 1000 mg via ORAL
  Filled 2021-06-09: qty 2

## 2021-06-09 NOTE — ED Triage Notes (Signed)
Pt brought in by EMS from gas station. She c/o leg pain. States she's been "sleeping all day". Pt poor historian ?

## 2021-06-09 NOTE — ED Provider Notes (Signed)
?[facility] EMERGENCY DEPARTMENT ?Provider Note ? ?CSN: [id number] ?Arrival date & time: 06/09/21 2138 ? ?Chief Complaint(s) ?Leg Pain ? ?HPI ?Betty Henderson is a 61 y.o. female   ? ?The history is provided by the patient.  ?Hip Pain ?This is a chronic ("many winters") problem. The problem occurs constantly. The problem has not changed since onset.Pertinent negatives include no chest pain, no abdominal pain, no headaches and no shortness of breath. The symptoms are aggravated by walking. Nothing relieves the symptoms. She has tried nothing for the symptoms.  ? ?Past Medical History ?Past Medical History:  ?Diagnosis Date  ? Anemia   ? Arthritis   ? Chronic pain   ? Drug-seeking behavior   ? Malingering   ? Osteopetrosis   ? Psychosis (HCC)   ? Schizoaffective disorder, bipolar type (HCC)   ? ?There are no problems to display for this patient. ? ?Home Medication(s) ?Prior to Admission medications   ?Medication Sig Start Date End Date Taking? Authorizing Provider  ?doxycycline (VIBRAMYCIN) 100 MG capsule Take 1 capsule (100 mg total) by mouth 2 (two) times daily. One po bid x 7 days 03/15/21   Charlynne Pander, MD  ?ibuprofen (ADVIL) 600 MG tablet Take 1 tablet (600 mg total) by mouth every 6 (six) hours as needed. 05/25/21   Redwine, Madison A, PA-C  ?ranitidine (ZANTAC) 150 MG capsule Take 1 capsule (150 mg total) by mouth daily. ?Patient not taking: Reported on 03/20/2018 05/31/17 01/07/19  Lorre Nick, MD  ?                                                                                                                                  ?Allergies ?Other, Lemon oil, Proanthocyanidin, Strawberry extract, Adhesive  [tape], and Cherry ? ?Review of Systems ?Review of Systems  ?Respiratory:  Negative for shortness of breath.   ?Cardiovascular:  Negative for chest pain.  ?Gastrointestinal:  Negative for abdominal pain.  ?Neurological:  Negative for headaches.  ?As noted in HPI ? ?Physical Exam ?Vital Signs   ?I have reviewed the triage vital signs ?BP (!) 108/57 (BP Location: Right Arm)   Pulse 82   Temp 98 ?F (36.7 ?C) (Oral)   Resp 16   Ht 5\' 6"  (1.676 m)   Wt 59.9 kg   SpO2 100%   BMI 21.31 kg/m?  ? ?Physical Exam ?Vitals reviewed.  ?Constitutional:   ?   General: She is not in acute distress. ?   Appearance: She is well-developed. She is not diaphoretic.  ?HENT:  ?   Head: Normocephalic and atraumatic.  ?   Right Ear: External ear normal.  ?   Left Ear: External ear normal.  ?   Nose: Nose normal.  ?Eyes:  ?   General: No scleral icterus. ?   Conjunctiva/sclera: Conjunctivae normal.  ?Neck:  ?   Trachea: Phonation normal.  ?Cardiovascular:  ?   Rate and Rhythm:  Normal rate and regular rhythm.  ?Pulmonary:  ?   Effort: Pulmonary effort is normal. No respiratory distress.  ?   Breath sounds: No stridor.  ?Abdominal:  ?   General: There is no distension.  ?Musculoskeletal:     ?   General: Normal range of motion.  ?   Cervical back: Normal range of motion.  ?   Right hip: No deformity, tenderness or bony tenderness. Normal range of motion.  ?   Left hip: No deformity, tenderness or bony tenderness. Normal range of motion.  ?Neurological:  ?   Mental Status: She is alert and oriented to person, place, and time.  ?Psychiatric:     ?   Behavior: Behavior normal.  ? ? ?ED Results and Treatments ?Labs ?(all labs ordered are listed, but only abnormal results are displayed) ?Labs Reviewed - No data to display                                                                                                                       ?EKG ? EKG Interpretation ? ?Date/Time:    ?Ventricular Rate:    ?PR Interval:    ?QRS Duration:   ?QT Interval:    ?QTC Calculation:   ?R Axis:     ?Text Interpretation:   ?  ? ?  ? ?Radiology ?No results found. ? ?Pertinent labs & imaging results that were available during my care of the patient were reviewed by me and considered in my medical decision making (see MDM for  details). ? ?Medications Ordered in ED ?Medications  ?acetaminophen (TYLENOL) tablet 1,000 mg (1,000 mg Oral Given 06/09/21 2343)  ?                                                               ?                                                                    ?Procedures ?Procedures ? ?(including critical care time) ? ?Medical Decision Making / ED Course ? ? ? Complexity of Problem: ? ?Co-morbidities/SDOH that complicate the patient evaluation/care: ?Homeless with 10 visits in the last 20 days ? ?Additional history obtained: ?none ? ?Patient's presenting problem/concern and DDX listed below: ?Chronic hip pain ?No signs of trauma ?Ambulating well ? ?  Complexity of Data: ?  ?Cardiac Monitoring: ?none ? ?Laboratory Tests ordered listed below with my independent interpretation: ?none ?  ?Imaging Studies ordered listed below with my independent interpretation: ?none ?  ?  ?ED Course:   ? ?  Hospitalization Considered:  ?no ? ?Assessment, Intervention, and Reassessment: ?Chronic hip pain ?No need for imaging as no new trauma reported. ?Tylenol given  ? ? ?Final Clinical Impression(s) / ED Diagnoses ?Final diagnoses:  ?Chronic pain of both hips  ? ?The patient appears reasonably screened and/or stabilized for discharge and I doubt any other medical condition or other [facility] requiring further screening, evaluation, or treatment in the ED at this time prior to discharge. Safe for discharge with strict return precautions. ? ?Disposition: Discharge ? ?Condition: Good ? ?I have discussed the results, Dx and Tx plan with the patient/family who expressed understanding and agree(s) with the plan. Discharge instructions discussed at length. The patient/family was given strict return precautions who verbalized understanding of the instructions. No further questions at time of discharge.  ? ? ?ED Discharge Orders   ? ? None  ? ?  ? ? ?Follow Up: ?Placey, Chales Abrahams, NP ?[address] [place] [address] ?[phone] ? ?Call   ? ? ? ? ?  ? ? ? ? ? ?This chart was dictated using voice recognition software.  Despite best efforts to proofread,  errors can occur which can change the documentation meaning. ? ?  ?Nira Conn, MD ?06/10/21 0630 ? ?

## 2021-06-09 NOTE — ED Notes (Addendum)
Attempted to discharge patient but she states she just got here and needs to rest a while.  Explained to patient there is no indication to keep her here in the ED at this time.  Charge nurse Charna Elizabeth, RN notified verbally. ?

## 2021-06-09 NOTE — ED Notes (Signed)
Pt was ambulatory into facility when she arrived with EMS, and remains ambulatory without assistance.  ?

## 2021-06-10 NOTE — ED Notes (Signed)
Wheelchair pushed to bedside to assist patient to lobby.  Patient became aggressive and cursing at staff.  Security notified.   ?

## 2021-06-10 NOTE — ED Notes (Signed)
Patient escorted to lobby in wheelchair.  ?

## 2021-06-11 ENCOUNTER — Emergency Department (HOSPITAL_COMMUNITY): Admission: EM | Admit: 2021-06-11 | Discharge: 2021-06-11 | Discharge status: Against Medical Advice | Payer: Self-pay

## 2021-06-11 NOTE — ED Notes (Signed)
Called for triage x6 

## 2021-06-14 ENCOUNTER — Encounter (HOSPITAL_COMMUNITY): Payer: Self-pay | Admitting: Pharmacy Technician

## 2021-06-14 ENCOUNTER — Emergency Department (HOSPITAL_COMMUNITY)
Admission: EM | Admit: 2021-06-14 | Discharge: 2021-06-14 | Disposition: A | Discharge status: Home / Self Care | Payer: Self-pay | Attending: Medical | Admitting: Medical

## 2021-06-14 ENCOUNTER — Other Ambulatory Visit: Payer: Self-pay

## 2021-06-14 DIAGNOSIS — G8929 Other chronic pain: Secondary | ICD-10-CM

## 2021-06-14 DIAGNOSIS — M25551 Pain in right hip: Secondary | ICD-10-CM | POA: Insufficient documentation

## 2021-06-14 DIAGNOSIS — M25552 Pain in left hip: Secondary | ICD-10-CM | POA: Insufficient documentation

## 2021-06-14 MED ORDER — ACETAMINOPHEN 500 MG PO TABS
1000.0000 mg | ORAL_TABLET | Freq: Once | ORAL | Status: AC
  Administered 2021-06-14: 1000 mg via ORAL
  Filled 2021-06-14: qty 2

## 2021-06-14 NOTE — ED Triage Notes (Signed)
Pt here with reports of hip pain for several days. When asked which side of her hip hurts pt replies "the whole thing".  ?

## 2021-06-14 NOTE — ED Provider Notes (Signed)
?[facility] ?Provider Note ? ? ?CSN: [id number] ?Arrival date & time: 06/14/21  1410 ? ?  ? ?History ? ?Chief Complaint  ?Patient presents with  ? Hip Pain  ? ? ?Betty Henderson is a 61 y.o. female presents to the ED today with complaint of chronic bilateral hip pain.  It is well-known to the emergency department.  She has had 21 ED visits in the past 6 months.  She denies any new falls.  No other complaints. ? ?The history is provided by the patient and medical records.  ? ?  ? ?Home Medications ?Prior to Admission medications   ?Medication Sig Start Date End Date Taking? Authorizing Provider  ?doxycycline (VIBRAMYCIN) 100 MG capsule Take 1 capsule (100 mg total) by mouth 2 (two) times daily. One po bid x 7 days 03/15/21   Drenda Freeze, MD  ?ibuprofen (ADVIL) 600 MG tablet Take 1 tablet (600 mg total) by mouth every 6 (six) hours as needed. 05/25/21   Redwine, Madison A, PA-C  ?ranitidine (ZANTAC) 150 MG capsule Take 1 capsule (150 mg total) by mouth daily. ?Patient not taking: Reported on 03/20/2018 05/31/17 01/07/19  Lacretia Leigh, MD  ?   ? ?Allergies    ?Other, Lemon oil, Proanthocyanidin, Strawberry extract, Adhesive  [tape], and Cherry   ? ?Review of Systems   ?Review of Systems  ?Constitutional:  Negative for chills and fever.  ?Musculoskeletal:  Positive for arthralgias.  ?All other systems reviewed and are negative. ? ?Physical Exam ?Updated Vital Signs ?BP 120/71 (BP Location: Left Arm)   Pulse 77   Temp 98.8 ?F (37.1 ?C) (Oral)   Resp 16   SpO2 99%  ?Physical Exam ?Vitals and nursing note reviewed.  ?Constitutional:   ?   Appearance: She is not ill-appearing.  ?HENT:  ?   Head: Normocephalic and atraumatic.  ?Eyes:  ?   Conjunctiva/sclera: Conjunctivae normal.  ?Cardiovascular:  ?   Rate and Rhythm: Normal rate and regular rhythm.  ?Pulmonary:  ?   Effort: Pulmonary effort is normal.  ?   Breath sounds: Normal breath sounds.  ?Skin: ?   General: Skin is  warm and dry.  ?   Coloration: Skin is not jaundiced.  ?Neurological:  ?   Mental Status: She is alert.  ? ? ?ED Results / Procedures / Treatments   ?Labs ?(all labs ordered are listed, but only abnormal results are displayed) ?Labs Reviewed - No data to display ? ?EKG ?None ? ?Radiology ?No results found. ? ?Procedures ?Procedures  ? ? ?Medications Ordered in ED ?Medications  ?acetaminophen (TYLENOL) tablet 1,000 mg (1,000 mg Oral Given 06/14/21 1623)  ? ? ?ED Course/ Medical Decision Making/ A&P ?  ?                        ?Medical Decision Making ?61 year old female who is here for chronic hip pain.  No falls recently.  Very well-known to the ED today.  Vitals are stable.  Patient is homeless, history of malingering.  Tylenol provided.  She reports improvement in her symptoms.  She states she is ready to go.  Stable for discharge. ? ?Problems Addressed: ?Chronic pain of both hips: acute illness or injury ? ?Risk ?OTC drugs. ? ? ? ? ? ? ? ? ? ?Final Clinical Impression(s) / ED Diagnoses ?Final diagnoses:  ?Chronic pain of both hips  ? ? ?Rx / DC Orders ?ED Discharge Orders   ? ?  None  ? ?  ? ? ?  ?Eustaquio Maize, PA-C ?06/14/21 1636 ? ?  ?Deno Etienne, DO ?06/14/21 1941 ? ?

## 2021-06-15 ENCOUNTER — Ambulatory Visit: Payer: Self-pay | Admitting: Nurse Practitioner

## 2021-06-21 ENCOUNTER — Encounter (HOSPITAL_BASED_OUTPATIENT_CLINIC_OR_DEPARTMENT_OTHER): Payer: Self-pay | Admitting: Emergency Medicine

## 2021-06-21 ENCOUNTER — Other Ambulatory Visit: Payer: Self-pay

## 2021-06-21 DIAGNOSIS — J3489 Other specified disorders of nose and nasal sinuses: Secondary | ICD-10-CM | POA: Insufficient documentation

## 2021-06-21 NOTE — ED Triage Notes (Signed)
She is poor historian and difficult to understand  ?

## 2021-06-21 NOTE — ED Triage Notes (Signed)
Pt reports "some kind of cold". Reports cough, nasal congestion.  ?

## 2021-06-22 ENCOUNTER — Other Ambulatory Visit: Payer: Self-pay

## 2021-06-22 ENCOUNTER — Encounter (HOSPITAL_BASED_OUTPATIENT_CLINIC_OR_DEPARTMENT_OTHER): Payer: Self-pay

## 2021-06-22 ENCOUNTER — Emergency Department (HOSPITAL_BASED_OUTPATIENT_CLINIC_OR_DEPARTMENT_OTHER)
Admission: EM | Admit: 2021-06-22 | Discharge: 2021-06-22 | Disposition: A | Discharge status: Home / Self Care | Payer: Self-pay | Attending: Emergency Medicine | Admitting: Emergency Medicine

## 2021-06-22 DIAGNOSIS — Z765 Malingerer [conscious simulation]: Secondary | ICD-10-CM

## 2021-06-22 DIAGNOSIS — J3489 Other specified disorders of nose and nasal sinuses: Secondary | ICD-10-CM

## 2021-06-22 DIAGNOSIS — R238 Other skin changes: Secondary | ICD-10-CM | POA: Insufficient documentation

## 2021-06-22 NOTE — Discharge Instructions (Signed)
Follow up with your doctor for recheck.  °

## 2021-06-22 NOTE — ED Notes (Signed)
PT given peanut butter crackers and hot tea ?

## 2021-06-22 NOTE — ED Provider Notes (Signed)
? ?  [facility] EMERGENCY DEPARTMENT  ?Provider Note ? ?CSN: [id number] ?Arrival date & time: 06/21/21 2135 ? ?History ?Chief Complaint  ?Patient presents with  ? URI  ? ? ?Betty Henderson is a 61 y.o. female with frequent ED visits for a variety of vague complaints came by ambulance today for unclear reasons she is not able to elucidate but seems to be again related to her chronic pani. She reports a runny nose and is asking if she can take alka-seltzer plus. She does not have any other acute complaints. She was at [facility] for similar 2 days ago.  ? ? ?Home Medications ?Prior to Admission medications   ?Medication Sig Start Date End Date Taking? Authorizing Provider  ?doxycycline (VIBRAMYCIN) 100 MG capsule Take 1 capsule (100 mg total) by mouth 2 (two) times daily. One po bid x 7 days 03/15/21   Drenda Freeze, MD  ?ibuprofen (ADVIL) 600 MG tablet Take 1 tablet (600 mg total) by mouth every 6 (six) hours as needed. 05/25/21   Redwine, Madison A, PA-C  ?ranitidine (ZANTAC) 150 MG capsule Take 1 capsule (150 mg total) by mouth daily. ?Patient not taking: Reported on 03/20/2018 05/31/17 01/07/19  Lacretia Leigh, MD  ? ? ? ?Allergies    ?Other, Lemon oil, Proanthocyanidin, Strawberry extract, Adhesive  [tape], and Cherry ? ? ?Review of Systems   ?Review of Systems ?Please see HPI for pertinent positives and negatives ? ?Physical Exam ?BP 114/68 (BP Location: Right Arm)   Pulse 81   Temp 98.4 ?F (36.9 ?C) (Oral)   Resp 20   SpO2 99%  ? ?Physical Exam ?Vitals and nursing note reviewed.  ?HENT:  ?   Head: Normocephalic.  ?   Nose: Nose normal.  ?Eyes:  ?   Extraocular Movements: Extraocular movements intact.  ?Pulmonary:  ?   Effort: Pulmonary effort is normal.  ?Musculoskeletal:     ?   General: Normal range of motion.  ?   Cervical back: Neck supple.  ?Skin: ?   Findings: No rash (on exposed skin).  ?Neurological:  ?   Mental Status: She is alert and oriented to person, place, and time.  ?Psychiatric:     ?    Mood and Affect: Mood normal.  ? ? ?ED Results / Procedures / Treatments   ?EKG ?None ? ?Procedures ?Procedures ? ?Medications Ordered in the ED ?Medications - No data to display ? ?Initial Impression and Plan ? Patient with history of homelessness, malingering and chronic pain is here for unclear reasons. She does not report any new symptoms other than a runny nose. Recommend OTC meds. PCP follow up.  ? ?ED Course  ? ?  ? ? ?MDM Rules/Calculators/A&P ?Medical Decision Making ?Problems Addressed: ?Malingering: chronic illness or injury ?Rhinorrhea: acute illness or injury ? ?Risk ?OTC drugs. ? ? ? ?Final Clinical Impression(s) / ED Diagnoses ?Final diagnoses:  ?Rhinorrhea  ?Malingering  ? ? ?Rx / DC Orders ?ED Discharge Orders   ? ? None  ? ?  ? ?  ?Truddie Hidden, MD ?06/22/21 0129 ? ?

## 2021-06-22 NOTE — Progress Notes (Signed)
.  Transition of Care [facility]) - Emergency Department Mini Assessment ? ? ?Patient Details  ?Name: Betty Henderson ?MRN: [id number] ?Date of Birth: May 27, 1960 ? ?Transition of Care (TOC) CM/SW Contact:    ?Valentina Shaggy Voyd Groft, LCSW ?Phone Number: ?06/22/2021, 10:40 AM ? ? ?Clinical Narrative: ? ?CSW received consult on pt who has had 23 ED visit this past year. Pt has not left the hospital in 3 days , pt would get d/c , then check back in. Pt has 4 to 5 ED visit a month for the past 3 years. CSW spoke with pt's Son requesting assistance he stated he is unable to pick pt up. He stated he dos not know where his mother been living , he stated he thinks she maybe lives with her sister at [address] [employer]  [place]. CSW attempted to call pt's sister number in chart is not in service. Pt can receive a ride back to her address in chart. CSW will make APS report to see if they can assist. ? ? ?ED Mini Assessment: ?  ? ?  ? ?  ? ?  ? ?Interventions which prevented an admission or readmission: Transportation Screening, Homeless Screening ? ? ? ?Patient Contact and Communications ?  ?  ?  ? ,     ?  ?  ? ?  ?  ?  ? ?Admission diagnosis:  HEAD ISSUE ?There are no problems to display for this patient. ? ?PCP:  Lavinia Sharps, NP ?Pharmacy:   ?[id number] - [place], [place] - [address] ?[address] ?[place] [address] ?Phone: [phone] Fax: [phone] ? ?[facility] ?[address] ?[place] [address] ?Phone: [phone] Fax: [phone] ?  ?

## 2021-06-22 NOTE — ED Provider Notes (Signed)
?[facility] EMERGENCY DEPARTMENT ?Provider Note ? ? ?CSN: [id number] ?Arrival date & time: 06/22/21  4818 ? ?  ? ?History ? ?Chief Complaint  ?Patient presents with  ? Headache  ? ? ?Levi Crass is a 61 y.o. female. ? ?61 year old female returns the emergency room with complaint of feeling like she has bugs in her hair and is concerned she may have lice. ?Patient states that she moved here from [place] several years ago, has sons who live in the area, does not live with them as they are truck drivers and not home often and are married.  Patient does have a sister locally.  Patient denies any significant medical history, does not take medications for anything, states that she used to be on antibiotics that she got from [facility] otherwise does not take medication. ?Past medical history significant for schizoaffective disorder, bipolar type. ? ? ?  ? ?Home Medications ?Prior to Admission medications   ?Medication Sig Start Date End Date Taking? Authorizing Provider  ?doxycycline (VIBRAMYCIN) 100 MG capsule Take 1 capsule (100 mg total) by mouth 2 (two) times daily. One po bid x 7 days 03/15/21   Charlynne Pander, MD  ?ibuprofen (ADVIL) 600 MG tablet Take 1 tablet (600 mg total) by mouth every 6 (six) hours as needed. 05/25/21   Redwine, Madison A, PA-C  ?ranitidine (ZANTAC) 150 MG capsule Take 1 capsule (150 mg total) by mouth daily. ?Patient not taking: Reported on 03/20/2018 05/31/17 01/07/19  Lorre Nick, MD  ?   ? ?Allergies    ?Other, Lemon oil, Proanthocyanidin, Strawberry extract, Adhesive  [tape], and Cherry   ? ?Review of Systems   ?Review of Systems ?Negative except as per HPI. ?Physical Exam ?Updated Vital Signs ?BP (!) 122/57 (BP Location: Right Arm)   Pulse 73   Temp 97.9 ?F (36.6 ?C) (Oral)   Resp 18   Ht 5\' 6"  (1.676 m)   Wt 59.9 kg   SpO2 98%   BMI 21.31 kg/m?  ?Physical Exam ?Vitals and nursing note reviewed.  ?Constitutional:   ?   Appearance: She is not  ill-appearing or toxic-appearing.  ?HENT:  ?   Head: Normocephalic and atraumatic.  ?Pulmonary:  ?   Effort: Pulmonary effort is normal.  ?Skin: ?   General: Skin is warm and dry.  ?Neurological:  ?   General: No focal deficit present.  ?   Mental Status: She is alert.  ?Psychiatric:     ?   Speech: Speech is tangential.     ?   Thought Content: Thought content is not paranoid. Thought content does not include homicidal or suicidal ideation.  ? ? ?ED Results / Procedures / Treatments   ?Labs ?(all labs ordered are listed, but only abnormal results are displayed) ?Labs Reviewed - No data to display ? ?EKG ?None ? ?Radiology ?No results found. ? ?Procedures ?Procedures  ? ? ?Medications Ordered in ED ?Medications - No data to display ? ?ED Course/ Medical Decision Making/ A&P ?  ?                        ?Medical Decision Making ? ?61 year old female with complaint of concern for lice. No other complaints.  No lice found on exam, patient feels reassured and requests shampoo which is provided.  Denies headache identified in triage note.  Frequent recent visits to the emergency room, most recently seen 8 hours ago with a different complaint.  Contact with transitions of care team, spoke with Trula Ore who has reached out to patient's son who is unable to pick her up or care for her today.  Patient's sister is on file, did not answer the phone.  Plan is to contact APS to see if there is anything they can do to assist this patient in the long run, not felt to be in any immediate danger at this time and will be discharged back to address on file. ? ? ? ? ? ? ? ?Final Clinical Impression(s) / ED Diagnoses ?Final diagnoses:  ?Dry scalp  ? ? ?Rx / DC Orders ?ED Discharge Orders   ? ? None  ? ?  ? ? ?  ?Jeannie Fend, PA-C ?06/22/21 0957 ? ?  ?Franne Forts, DO ?06/24/21 [phone] ? ?

## 2021-06-22 NOTE — ED Triage Notes (Signed)
Pt arrives with reports of having a headache. Pt is mumbling and difficult to understand when asking patient about her recent visit here (she was seen around 0130 this morning) pt states 'they didn't do nothing. When asking patient where she went/what she did between her last visit and this visit, pt states I rested. Also states her legs are hurting.  ?

## 2021-06-22 NOTE — ED Notes (Signed)
ED Provider at bedside. 

## 2021-06-22 NOTE — ED Notes (Signed)
Pt states that she has bugs in her hair, none seen by RN or PA. Pt given shampoo to take home. Pt also given cab voucher.  ?

## 2021-06-25 ENCOUNTER — Encounter (HOSPITAL_BASED_OUTPATIENT_CLINIC_OR_DEPARTMENT_OTHER): Payer: Self-pay | Admitting: Emergency Medicine

## 2021-06-25 ENCOUNTER — Other Ambulatory Visit: Payer: Self-pay

## 2021-06-25 ENCOUNTER — Emergency Department (HOSPITAL_BASED_OUTPATIENT_CLINIC_OR_DEPARTMENT_OTHER)
Admission: EM | Admit: 2021-06-25 | Discharge: 2021-06-26 | Disposition: A | Discharge status: Home / Self Care | Payer: Self-pay | Attending: Emergency Medicine | Admitting: Emergency Medicine

## 2021-06-25 DIAGNOSIS — R059 Cough, unspecified: Secondary | ICD-10-CM | POA: Insufficient documentation

## 2021-06-25 DIAGNOSIS — M79606 Pain in leg, unspecified: Secondary | ICD-10-CM | POA: Insufficient documentation

## 2021-06-25 DIAGNOSIS — J069 Acute upper respiratory infection, unspecified: Secondary | ICD-10-CM

## 2021-06-25 DIAGNOSIS — R0981 Nasal congestion: Secondary | ICD-10-CM | POA: Insufficient documentation

## 2021-06-25 NOTE — ED Triage Notes (Signed)
Pt arrives via EMS, approached [facility] at [facility] [employer] and requested ride to ED. URI Sx and leg pain, also felt like near syncope. Has been seen here and [facility] for same.  ?

## 2021-06-26 ENCOUNTER — Encounter (HOSPITAL_BASED_OUTPATIENT_CLINIC_OR_DEPARTMENT_OTHER): Payer: Self-pay

## 2021-06-26 ENCOUNTER — Emergency Department (HOSPITAL_BASED_OUTPATIENT_CLINIC_OR_DEPARTMENT_OTHER)
Admission: EM | Admit: 2021-06-26 | Discharge: 2021-06-27 | Disposition: A | Discharge status: Home / Self Care | Payer: Self-pay | Attending: Emergency Medicine | Admitting: Emergency Medicine

## 2021-06-26 DIAGNOSIS — J069 Acute upper respiratory infection, unspecified: Secondary | ICD-10-CM

## 2021-06-26 DIAGNOSIS — J3489 Other specified disorders of nose and nasal sinuses: Secondary | ICD-10-CM | POA: Insufficient documentation

## 2021-06-26 DIAGNOSIS — R0981 Nasal congestion: Secondary | ICD-10-CM | POA: Insufficient documentation

## 2021-06-26 NOTE — ED Triage Notes (Signed)
Pt arrives with c/o URI that she was seen for on Monday. Pt has had symptoms that started last week.  ?

## 2021-06-26 NOTE — ED Provider Notes (Signed)
?[facility] EMERGENC[facility] DEPARTMENT ?Provider Note ? ? ?CSN: [id number] ?Arrival date & time: 06/26/21  2351 ? ?  ? ?History ? ?Chief Complaint  ?Patient presents with  ? Nasal Congestion  ? ? ?Betty Henderson is a 61 y.o. female. ? ?The history is provided by the patient.  ?URI ?Presenting symptoms: congestion and rhinorrhea   ?Presenting symptoms: no fever and no sore throat   ?Severity:  Mild ?Onset quality:  Gradual ?Duration:  1 week ?Timing:  Constant ?Progression:  Unchanged ?Chronicity:  New ?Relieved by:  Nothing ?Worsened by:  Nothing ?Ineffective treatments:  None tried ?Associated symptoms: no arthralgias, no headaches and no wheezing   ?Risk factors: not elderly and no immunosuppression   ?Seen last night for same and did not take any medication.  ?  ? ?Home Medications ?Prior to Admission medications   ?Medication Sig Start Date End Date Taking? Authorizing Provider  ?doxycycline (VIBRAMYCIN) 100 MG capsule Take 1 capsule (100 mg total) by mouth 2 (two) times daily. One po bid x 7 days 03/15/21   Charlynne Pander, MD  ?ibuprofen (ADVIL) 600 MG tablet Take 1 tablet (600 mg total) by mouth every 6 (six) hours as needed. 05/25/21   Redwine, Madison A, PA-C  ?ranitidine (ZANTAC) 150 MG capsule Take 1 capsule (150 mg total) by mouth daily. ?Patient not taking: Reported on 03/20/2018 05/31/17 01/07/19  Lorre Nick, MD  ?   ? ?Allergies    ?Other, Lemon oil, Proanthocyanidin, Strawberry extract, Adhesive  [tape], and Cherry   ? ?Review of Systems   ?Review of Systems  ?Constitutional:  Negative for fever.  ?HENT:  Positive for congestion and rhinorrhea. Negative for sore throat.   ?Eyes:  Negative for redness.  ?Respiratory:  Negative for shortness of breath, wheezing and stridor.   ?Cardiovascular:  Negative for leg swelling.  ?Gastrointestinal:  Negative for abdominal pain.  ?Genitourinary:  Negative for difficulty urinating.  ?Musculoskeletal:  Negative for arthralgias.  ?Neurological:  Negative  for headaches.  ?All other systems reviewed and are negative. ? ?Physical Exam ?Updated Vital Signs ?Ht 5\' 6"  (1.676 m)   Wt 59.4 kg   BMI 21.14 kg/m?  ?Physical Exam ?Vitals and nursing note reviewed. Exam conducted with a chaperone present.  ?Constitutional:   ?   General: She is not in acute distress. ?   Appearance: Normal appearance.  ?HENT:  ?   Head: Normocephalic and atraumatic.  ?   Nose: Congestion present.  ?Eyes:  ?   Conjunctiva/sclera: Conjunctivae normal.  ?   Pupils: Pupils are equal, round, and reactive to light.  ?Cardiovascular:  ?   Rate and Rhythm: Normal rate and regular rhythm.  ?   Pulses: Normal pulses.  ?   Heart sounds: Normal heart sounds.  ?Pulmonary:  ?   Effort: Pulmonary effort is normal.  ?   Breath sounds: Normal breath sounds.  ?Abdominal:  ?   General: Bowel sounds are normal.  ?   Palpations: Abdomen is soft.  ?   Tenderness: There is no abdominal tenderness. There is no guarding.  ?Musculoskeletal:     ?   General: Normal range of motion.  ?   Cervical back: Normal range of motion and neck supple.  ?Skin: ?   General: Skin is warm and dry.  ?   Capillary Refill: Capillary refill takes less than 2 seconds.  ?Neurological:  ?   General: No focal deficit present.  ?   Mental Status: She is alert  and oriented to person, place, and time.  ?   Deep Tendon Reflexes: Reflexes normal.  ?Psychiatric:     ?   Mood and Affect: Mood normal.     ?   Behavior: Behavior normal.  ? ? ?ED Results / Procedures / Treatments   ?Labs ?(all labs ordered are listed, but only abnormal results are displayed) ?Labs Reviewed - No data to display ? ?EKG ?None ? ?Radiology ?No results found. ? ?Procedures ?Procedures  ? ? ?Medications Ordered in ED ?Medications - No data to display ? ?ED Course/ Medical Decision Making/ A&P ?  ?                        ?Medical Decision Making ?URI x 1 week  ? ?Amount and/or Complexity of Data Reviewed ?External Data Reviewed: notes. ?   Details: ED note from last  night ? ?Risk ?Risk Details: Lungs are clear, exam is benign and reassuring.  I do not believe labs or imaging are indicated.  Will prescribe flonase.  Stable for discharge with close follow up.  ? ? ? ?Final Clinical Impression(s) / ED Diagnoses ?Final diagnoses:  ?None  ? ?Return for intractable cough, coughing up blood, fevers > 100.4 unrelieved by medication, shortness of breath, intractable vomiting, chest pain, shortness of breath, weakness, numbness, changes in speech, facial asymmetry, abdominal pain, passing out, Inability to tolerate liquids or food, cough, altered mental status or any concerns. No signs of systemic illness or infection. The patient is nontoxic-appearing on exam and vital signs are within normal limits.  ?I have reviewed the triage vital signs and the nursing notes. Pertinent labs & imaging results that were available during my care of the patient were reviewed by me and considered in my medical decision making (see chart for details). After history, exam, and medical workup I feel the patient has been appropriately medically screened and is safe for discharge home. Pertinent diagnoses were discussed with the patient. Patient was given return precautions.  ?Rx / DC Orders ?ED Discharge Orders   ? ? None  ? ?  ? ? ?  ?Alfrieda Tarry, MD ?06/27/21 0001 ? ?

## 2021-06-26 NOTE — Discharge Instructions (Signed)
Take over-the-counter medications as needed for relief of symptoms. ? ?Drink plenty of fluids and get plenty of rest. ?

## 2021-06-26 NOTE — ED Provider Notes (Signed)
?[facility] EMERGENCY DEPARTMENT ?Provider Note ? ? ?CSN: [id number] ?Arrival date & time: 06/25/21  1917 ? ?  ? ?History ? ?Chief Complaint  ?Patient presents with  ? URI  ? Leg Pain  ? ? ?Betty Henderson is a 61 y.o. female. ? ?Patient is a 61 year old female with past medical history of schizophrenia, homelessness.  Patient presenting today for evaluation of cough and congestion.  This has been worsening over the past 2 days.  Patient reports nonproductive cough with no associated chest pains or shortness of breath.  She apparently approached EMS while they were at [facility] and requested transport to the emergency department.  Patient is well-known to the emergency department for frequent visits involving shelter and food. ? ?The history is provided by the patient.  ?URI ?Presenting symptoms: congestion and cough   ?Severity:  Moderate ?Onset quality:  Gradual ?Duration:  2 days ?Timing:  Constant ?Progression:  Worsening ?Chronicity:  New ?Worsened by:  Nothing ?Ineffective treatments:  None tried ?Leg Pain ? ?  ? ?Home Medications ?Prior to Admission medications   ?Medication Sig Start Date End Date Taking? Authorizing Provider  ?doxycycline (VIBRAMYCIN) 100 MG capsule Take 1 capsule (100 mg total) by mouth 2 (two) times daily. One po bid x 7 days 03/15/21   Charlynne Pander, MD  ?ibuprofen (ADVIL) 600 MG tablet Take 1 tablet (600 mg total) by mouth every 6 (six) hours as needed. 05/25/21   Redwine, Madison A, PA-C  ?ranitidine (ZANTAC) 150 MG capsule Take 1 capsule (150 mg total) by mouth daily. ?Patient not taking: Reported on 03/20/2018 05/31/17 01/07/19  Lorre Nick, MD  ?   ? ?Allergies    ?Other, Lemon oil, Proanthocyanidin, Strawberry extract, Adhesive  [tape], and Cherry   ? ?Review of Systems   ?Review of Systems  ?HENT:  Positive for congestion.   ?Respiratory:  Positive for cough.   ?All other systems reviewed and are negative. ? ?Physical Exam ?Updated Vital Signs ?BP 114/66 (BP  Location: Right Arm)   Pulse 73   Temp 98.5 ?F (36.9 ?C) (Oral)   Resp 20   Ht 5\' 6"  (1.676 m)   Wt 59.8 kg   SpO2 99%   BMI 21.28 kg/m?  ?Physical Exam ?Vitals and nursing note reviewed.  ?Constitutional:   ?   General: She is not in acute distress. ?   Appearance: She is well-developed. She is not diaphoretic.  ?HENT:  ?   Head: Normocephalic and atraumatic.  ?Cardiovascular:  ?   Rate and Rhythm: Normal rate and regular rhythm.  ?   Heart sounds: No murmur heard. ?  No friction rub. No gallop.  ?Pulmonary:  ?   Effort: Pulmonary effort is normal. No respiratory distress.  ?   Breath sounds: Normal breath sounds. No wheezing.  ?Abdominal:  ?   General: Bowel sounds are normal. There is no distension.  ?   Palpations: Abdomen is soft.  ?   Tenderness: There is no abdominal tenderness.  ?Musculoskeletal:     ?   General: Normal range of motion.  ?   Cervical back: Normal range of motion and neck supple.  ?Skin: ?   General: Skin is warm and dry.  ?Neurological:  ?   General: No focal deficit present.  ?   Mental Status: She is alert and oriented to person, place, and time.  ? ? ?ED Results / Procedures / Treatments   ?Labs ?(all labs ordered are listed, but only  abnormal results are displayed) ?Labs Reviewed - No data to display ? ?EKG ?None ? ?Radiology ?No results found. ? ?Procedures ?Procedures  ? ? ?Medications Ordered in ED ?Medications - No data to display ? ?ED Course/ Medical Decision Making/ A&P ? ?Patient presenting with complaints of URI symptoms for the past 2 days.  Symptoms most likely viral in nature.  Vitals are stable with no hypoxia.  Lungs are clear.  Patient will be advised to take over-the-counter medications and follow-up as needed if not improving. ? ?Final Clinical Impression(s) / ED Diagnoses ?Final diagnoses:  ?None  ? ? ?Rx / DC Orders ?ED Discharge Orders   ? ? None  ? ?  ? ? ?  ?Geoffery Lyons, MD ?06/26/21 0003 ? ?

## 2021-06-26 NOTE — ED Notes (Signed)
Patient given coffee, underwear and another blanket per request.  ?

## 2021-06-26 NOTE — ED Notes (Signed)
Patient discharged to home.  All discharge instructions reviewed.  Patient verbalized understanding via teachback method.  VS WDL.  Respirations even and unlabored.  Ambulatory out of ED.   °

## 2021-06-27 MED ORDER — FLUTICASONE PROPIONATE 50 MCG/ACT NA SUSP: 1.0000 | Freq: Every day | NASAL | 0 refills | Status: DC

## 2021-07-20 ENCOUNTER — Other Ambulatory Visit: Payer: Self-pay

## 2021-07-20 ENCOUNTER — Emergency Department (HOSPITAL_BASED_OUTPATIENT_CLINIC_OR_DEPARTMENT_OTHER)
Admission: EM | Admit: 2021-07-20 | Discharge: 2021-07-20 | Disposition: A | Discharge status: Home / Self Care | Payer: Self-pay | Attending: Emergency Medicine | Admitting: Emergency Medicine

## 2021-07-20 ENCOUNTER — Encounter (HOSPITAL_BASED_OUTPATIENT_CLINIC_OR_DEPARTMENT_OTHER): Payer: Self-pay | Admitting: Emergency Medicine

## 2021-07-20 DIAGNOSIS — M25552 Pain in left hip: Secondary | ICD-10-CM | POA: Insufficient documentation

## 2021-07-20 DIAGNOSIS — G8929 Other chronic pain: Secondary | ICD-10-CM | POA: Insufficient documentation

## 2021-07-20 MED ORDER — KETOROLAC TROMETHAMINE 15 MG/ML IJ SOLN
15.0000 mg | Freq: Once | INTRAMUSCULAR | Status: AC
  Administered 2021-07-20: 15 mg via INTRAMUSCULAR
  Filled 2021-07-20: qty 1

## 2021-07-20 NOTE — ED Triage Notes (Signed)
Pt c/o left hip pain.  ?

## 2021-07-20 NOTE — ED Notes (Signed)
Pt allowed to sleep in room until morning. Pt provided with food and drink per request.  ?

## 2021-07-20 NOTE — ED Notes (Signed)
Pt given cab voucher and is ambulatory out of department ?

## 2021-07-20 NOTE — ED Provider Notes (Signed)
?[facility] EMERGENCY DEPARTMENT ?Provider Note ? ? ?CSN: [id number] ?Arrival date & time: 07/20/21  0015 ? ?  ? ?History ? ?Chief Complaint  ?Patient presents with  ? Hip Pain  ? ? ?Betty Henderson is a 61 y.o. female. ? ?61 yo F with a chief complaint of left hip pain.  She tells me that this usually gets worse when the weather changes.  Is been raining for the past 12 hours or so and she thinks that is what caused it.  Has taken medication at home but without improvement. ? ? ?Hip Pain ? ? ?  ? ?Home Medications ?Prior to Admission medications   ?Medication Sig Start Date End Date Taking? Authorizing Provider  ?doxycycline (VIBRAMYCIN) 100 MG capsule Take 1 capsule (100 mg total) by mouth 2 (two) times daily. One po bid x 7 days 03/15/21   Charlynne Pander, MD  ?fluticasone [facility]) 50 MCG/ACT nasal spray Place 1 spray into both nostrils daily. 06/27/21   Palumbo, April, MD  ?ibuprofen (ADVIL) 600 MG tablet Take 1 tablet (600 mg total) by mouth every 6 (six) hours as needed. 05/25/21   Redwine, Madison A, PA-C  ?ranitidine (ZANTAC) 150 MG capsule Take 1 capsule (150 mg total) by mouth daily. ?Patient not taking: Reported on 03/20/2018 05/31/17 01/07/19  Lorre Nick, MD  ?   ? ?Allergies    ?Other, Lemon oil, Proanthocyanidin, Strawberry extract, Adhesive  [tape], and Cherry   ? ?Review of Systems   ?Review of Systems ? ?Physical Exam ?Updated Vital Signs ?BP 119/63 (BP Location: Right Arm)   Pulse 74   Temp (!) 97.4 ?F (36.3 ?C) (Oral)   Resp 16   Ht 5\' 6"  (1.676 m)   Wt 61.2 kg   SpO2 100%   BMI 21.79 kg/m?  ?Physical Exam ?Vitals and nursing note reviewed.  ?Constitutional:   ?   General: She is not in acute distress. ?   Appearance: She is well-developed. She is not diaphoretic.  ?HENT:  ?   Head: Normocephalic and atraumatic.  ?Eyes:  ?   Pupils: Pupils are equal, round, and reactive to light.  ?Cardiovascular:  ?   Rate and Rhythm: Normal rate and regular rhythm.  ?   Heart sounds: No  murmur heard. ?  No friction rub. No gallop.  ?Pulmonary:  ?   Effort: Pulmonary effort is normal.  ?   Breath sounds: No wheezing or rales.  ?Abdominal:  ?   General: There is no distension.  ?   Palpations: Abdomen is soft.  ?   Tenderness: There is no abdominal tenderness.  ?Musculoskeletal:     ?   General: No tenderness.  ?   Cervical back: Normal range of motion and neck supple.  ?   Comments: She points to the left groin as area of pain.  No obvious pain with internal or external rotation of the left lower extremity.  Pulse motor and sensation intact distally.  No signs of trauma.  ?Skin: ?   General: Skin is warm and dry.  ?Neurological:  ?   Mental Status: She is alert and oriented to person, place, and time.  ?Psychiatric:     ?   Behavior: Behavior normal.  ? ? ?ED Results / Procedures / Treatments   ?Labs ?(all labs ordered are listed, but only abnormal results are displayed) ?Labs Reviewed - No data to display ? ?EKG ?None ? ?Radiology ?No results found. ? ?Procedures ?Procedures  ? ? ?  Medications Ordered in ED ?Medications  ?ketorolac (TORADOL) 15 MG/ML injection 15 mg (15 mg Intramuscular Given 07/20/21 0032)  ? ? ?ED Course/ Medical Decision Making/ A&P ?  ?                        ?Medical Decision Making ?Risk ?Prescription drug management. ? ? ?61 yo F with a chief complaint of left hip pain.  This is a chronic problem for the patient.  She is well-known to this ED with 23 visits in the past 6 months.  Has also recently been seen in another ER for this.  I do not feel that imaging would be helpful.  We will treat symptomatically.  PCP follow-up. ? ?12:42 AM:  I have discussed the diagnosis/risks/treatment options with the patient.  Evaluation and diagnostic testing in the emergency department does not suggest an emergent condition requiring admission or immediate intervention beyond what has been performed at this time.  They will follow up with  PCP. We also discussed returning to the ED  immediately if new or worsening sx occur. We discussed the sx which are most concerning (e.g., sudden worsening pain, fever, inability to tolerate by mouth) that necessitate immediate return. Medications administered to the patient during their visit and any new prescriptions provided to the patient are listed below. ? ?Medications given during this visit ?Medications  ?ketorolac (TORADOL) 15 MG/ML injection 15 mg (15 mg Intramuscular Given 07/20/21 0032)  ? ? ? ?The patient appears reasonably screen and/or stabilized for discharge and I doubt any other medical condition or other [facility] requiring further screening, evaluation, or treatment in the ED at this time prior to discharge.  ? ? ? ? ? ? ? ? ?Final Clinical Impression(s) / ED Diagnoses ?Final diagnoses:  ?Left hip pain  ? ? ?Rx / DC Orders ?ED Discharge Orders   ? ? None  ? ?  ? ? ?  ?Melene Plan, DO ?07/20/21 [phone] ? ?

## 2021-07-20 NOTE — Discharge Instructions (Signed)
Take 4 over the counter ibuprofen tablets 3 times a day or 2 over-the-counter naproxen tablets twice a day for pain. Also take tylenol 1000mg(2 extra strength) four times a day.    

## 2021-07-21 ENCOUNTER — Other Ambulatory Visit: Payer: Self-pay

## 2021-07-21 ENCOUNTER — Encounter (HOSPITAL_BASED_OUTPATIENT_CLINIC_OR_DEPARTMENT_OTHER): Payer: Self-pay | Admitting: Emergency Medicine

## 2021-07-21 DIAGNOSIS — F17210 Nicotine dependence, cigarettes, uncomplicated: Secondary | ICD-10-CM | POA: Insufficient documentation

## 2021-07-21 DIAGNOSIS — M25552 Pain in left hip: Secondary | ICD-10-CM | POA: Insufficient documentation

## 2021-07-21 DIAGNOSIS — M25551 Pain in right hip: Secondary | ICD-10-CM | POA: Insufficient documentation

## 2021-07-21 DIAGNOSIS — G8929 Other chronic pain: Secondary | ICD-10-CM | POA: Insufficient documentation

## 2021-07-21 DIAGNOSIS — Z765 Malingerer [conscious simulation]: Secondary | ICD-10-CM | POA: Insufficient documentation

## 2021-07-21 NOTE — ED Triage Notes (Signed)
Reports being achy with hip pain.  Not taking anything at home. ?

## 2021-07-22 ENCOUNTER — Emergency Department (HOSPITAL_BASED_OUTPATIENT_CLINIC_OR_DEPARTMENT_OTHER)
Admission: EM | Admit: 2021-07-22 | Discharge: 2021-07-22 | Disposition: A | Discharge status: Home / Self Care | Payer: Self-pay | Attending: Emergency Medicine | Admitting: Emergency Medicine

## 2021-07-22 DIAGNOSIS — G8929 Other chronic pain: Secondary | ICD-10-CM

## 2021-07-22 DIAGNOSIS — Z765 Malingerer [conscious simulation]: Secondary | ICD-10-CM

## 2021-07-22 MED ORDER — NAPROXEN 250 MG PO TABS
500.0000 mg | ORAL_TABLET | Freq: Once | ORAL | Status: AC
  Administered 2021-07-22: 500 mg via ORAL
  Filled 2021-07-22: qty 2

## 2021-07-22 NOTE — ED Provider Notes (Signed)
? ?[facility] ?Provider Note: Lowella Dell, MD, FACEP ? ?CSN: [id number] ?MRN: [id number] ?ARRIVAL: 07/21/21 at 2233 ?ROOM: [facility] ? ? ?CHIEF COMPLAINT  ?Hip Pain ? ? ?HISTORY OF PRESENT ILLNESS  ?07/22/21 1:30 AM ?Betty Henderson is a 61 y.o. female with a history of psychiatric illness, chronic hip pain with and 24 visits to [facility] health ED's in the past 6 months for same.  She is here with achy pain in the left hip which she rates as a 10 out of 10, worse with movement.  She is not taking anything for her pain. ? ? ?Past Medical History:  ?Diagnosis Date  ? Anemia   ? Arthritis   ? Chronic pain   ? Drug-seeking behavior   ? Malingering   ? Osteopetrosis   ? Psychosis (HCC)   ? Schizoaffective disorder, bipolar type (HCC)   ? ? ?Past Surgical History:  ?Procedure Laterality Date  ? MOUTH SURGERY    ? TUBAL LIGATION    ? ? ?Family History  ?Family history unknown: Yes  ? ? ?Social History  ? ?Tobacco Use  ? Smoking status: Some Days  ?  Packs/day: 1.00  ?  Types: Cigarettes  ? Smokeless tobacco: Never  ?Vaping Use  ? Vaping Use: Never used  ?Substance Use Topics  ? Alcohol use: Yes  ?  Comment: occ  ? Drug use: Not Currently  ?  Types: Cocaine  ?  Comment: states "it's legal though"  ? ? ?Prior to Admission medications   ?Medication Sig Start Date End Date Taking? Authorizing Provider  ?ranitidine (ZANTAC) 150 MG capsule Take 1 capsule (150 mg total) by mouth daily. ?Patient not taking: Reported on 03/20/2018 05/31/17 01/07/19  Lorre Nick, MD  ? ? ?Allergies ?Other, Lemon oil, Proanthocyanidin, Strawberry extract, Adhesive  [tape], and Cherry ? ? ?REVIEW OF SYSTEMS  ?Negative except as noted here or in the History of Present Illness. ? ? ?PHYSICAL EXAMINATION  ?Initial Vital Signs ?Blood pressure 120/71, pulse 67, temperature (!) 97.5 ?F (36.4 ?C), temperature source Oral, resp. rate 16, height 5\' 6"  (1.676 m), weight 61.2 kg, SpO2 100 %. ? ?Examination ?General: Well-developed, thin female in no  acute distress; appears older than age of record; poor personal hygiene ?HENT: normocephalic; atraumatic ?Eyes: Normal appearance ?Neck: supple ?Heart: regular rate and rhythm ?Lungs: clear to auscultation bilaterally ?Abdomen: soft; nondistended; nontender ?Extremities: No acute deformity; tenderness of her left hip ?Neurologic: Sleeping but readily awakened; noted to move all extremities ?Skin: Warm and dry ?Psychiatric: Flat affect ? ? ?RESULTS  ?Summary of this visit's results, reviewed and interpreted by myself: ? ? EKG Interpretation ? ?Date/Time:    ?Ventricular Rate:    ?PR Interval:    ?QRS Duration:   ?QT Interval:    ?QTC Calculation:   ?R Axis:     ?Text Interpretation:   ?  ? ?  ? ?Laboratory Studies: ?No results found for this or any previous visit (from the past 24 hour(s)). ?Imaging Studies: ?No results found. ? ?ED COURSE and MDM  ?Nursing notes, initial and subsequent vitals signs, including pulse oximetry, reviewed and interpreted by myself. ? ?Vitals:  ? 07/21/21 2243 07/21/21 2244  ?BP:  120/71  ?Pulse:  67  ?Resp:  16  ?Temp:  (!) 97.5 ?F (36.4 ?C)  ?TempSrc:  Oral  ?SpO2:  100%  ?Weight: 61.2 kg   ?Height: 5\' 6"  (1.676 m)   ? ?Medications  ?naproxen (NAPROSYN) tablet 500 mg (has no administration  in time range)  ? ? ?The patient is known to have end-stage degenerative changes of both hips which have been stable.  This does not appear to be an emergent condition and I do not believe any further work-up is indicated. ? ?PROCEDURES  ?Procedures ? ? ?ED DIAGNOSES  ? ?  ICD-10-CM   ?1. Malingering  Z76.5   ?  ?2. Chronic hip pain, bilateral  M25.551   ? M25.552   ? G89.29   ?  ? ? ? ?  ?Paula Libra, MD ?07/22/21 0136 ? ?

## 2021-07-24 ENCOUNTER — Emergency Department (HOSPITAL_COMMUNITY): Payer: Self-pay

## 2021-07-24 ENCOUNTER — Encounter (HOSPITAL_COMMUNITY): Payer: Self-pay

## 2021-07-24 ENCOUNTER — Other Ambulatory Visit: Payer: Self-pay

## 2021-07-24 ENCOUNTER — Emergency Department (HOSPITAL_COMMUNITY)
Admission: EM | Admit: 2021-07-24 | Discharge: 2021-07-24 | Discharge status: Against Medical Advice | Payer: Self-pay | Attending: Emergency Medicine | Admitting: Emergency Medicine

## 2021-07-24 ENCOUNTER — Emergency Department (HOSPITAL_COMMUNITY)
Admission: EM | Admit: 2021-07-24 | Discharge: 2021-07-25 | Discharge status: Against Medical Advice | Payer: Self-pay | Attending: Emergency Medicine | Admitting: Emergency Medicine

## 2021-07-24 DIAGNOSIS — W010XXA Fall on same level from slipping, tripping and stumbling without subsequent striking against object, initial encounter: Secondary | ICD-10-CM | POA: Insufficient documentation

## 2021-07-24 DIAGNOSIS — M25552 Pain in left hip: Secondary | ICD-10-CM | POA: Insufficient documentation

## 2021-07-24 DIAGNOSIS — M791 Myalgia, unspecified site: Secondary | ICD-10-CM | POA: Insufficient documentation

## 2021-07-24 DIAGNOSIS — Z5321 Procedure and treatment not carried out due to patient leaving prior to being seen by health care provider: Secondary | ICD-10-CM | POA: Insufficient documentation

## 2021-07-24 MED ORDER — ACETAMINOPHEN 325 MG PO TABS
650.0000 mg | ORAL_TABLET | Freq: Once | ORAL | Status: DC
Start: 2021-07-24 — End: 2021-07-24

## 2021-07-24 NOTE — ED Triage Notes (Signed)
Pt c/o L hip pain after "falling on the grass tonight," states she's been limping since. Ambulatory w steady even gait to triage. ?

## 2021-07-24 NOTE — ED Provider Triage Note (Signed)
Emergency Medicine Provider Triage Evaluation Note ? ?Betty Henderson , a 61 y.o. female  was evaluated in triage.  Pt complains of left hip pain status post mechanical fall.  Patient states she tripped and fell onto the grass.  Denies head injury or loss of consciousness. No other areas of pain. ? ?Review of Systems  ?Positive: Left hip pain ?Negative: Back pain, neck pain, abdominal pain, chest pain, numbness, weakness ? ?Physical Exam  ?BP 135/65   Pulse 64   Temp 98 ?F (36.7 ?C) (Oral)   Resp 14   SpO2 100%  ?Gen:   Awake, no distress   ?Resp:  Normal effort  ?MSK:   Normocephalic, atraumatic, no midline spinal tenderness.  Left lateral hip tenderness to palpation.  Otherwise nontender.  No chest or abdominal tenderness.  Neurovascular intact distally in the left lower extremity.  ? ?Medical Decision Making  ?Medically screening exam initiated at 1:10 AM.  Appropriate orders placed.  [name] was informed that the remainder of the evaluation will be completed by another provider, this initial triage assessment does not replace that evaluation, and the importance of remaining in the ED until their evaluation is complete. ? ?Left hip pain.  ?  [name], PA-C ?07/24/21 0112 ? ?

## 2021-07-24 NOTE — ED Triage Notes (Signed)
Pt states that she just doesn't feel good.  ?

## 2021-07-25 NOTE — ED Notes (Signed)
Pt called x1 in ED lobby. Pt could not be located within ED lobby, and did not respond to name call.   °

## 2021-07-28 ENCOUNTER — Emergency Department (HOSPITAL_BASED_OUTPATIENT_CLINIC_OR_DEPARTMENT_OTHER)
Admission: EM | Admit: 2021-07-28 | Discharge: 2021-07-28 | Disposition: A | Discharge status: Home / Self Care | Payer: Self-pay | Attending: Emergency Medicine | Admitting: Emergency Medicine

## 2021-07-28 ENCOUNTER — Encounter (HOSPITAL_BASED_OUTPATIENT_CLINIC_OR_DEPARTMENT_OTHER): Payer: Self-pay | Admitting: Emergency Medicine

## 2021-07-28 ENCOUNTER — Emergency Department (HOSPITAL_BASED_OUTPATIENT_CLINIC_OR_DEPARTMENT_OTHER): Payer: Self-pay

## 2021-07-28 ENCOUNTER — Other Ambulatory Visit: Payer: Self-pay

## 2021-07-28 DIAGNOSIS — Z765 Malingerer [conscious simulation]: Secondary | ICD-10-CM | POA: Insufficient documentation

## 2021-07-28 DIAGNOSIS — M1990 Unspecified osteoarthritis, unspecified site: Secondary | ICD-10-CM

## 2021-07-28 DIAGNOSIS — Z5900 Homelessness unspecified: Secondary | ICD-10-CM | POA: Insufficient documentation

## 2021-07-28 DIAGNOSIS — M160 Bilateral primary osteoarthritis of hip: Secondary | ICD-10-CM | POA: Insufficient documentation

## 2021-07-28 DIAGNOSIS — G894 Chronic pain syndrome: Secondary | ICD-10-CM | POA: Insufficient documentation

## 2021-07-28 DIAGNOSIS — M79669 Pain in unspecified lower leg: Secondary | ICD-10-CM | POA: Insufficient documentation

## 2021-07-28 MED ORDER — NAPROXEN 250 MG PO TABS
375.0000 mg | ORAL_TABLET | Freq: Once | ORAL | Status: AC
  Administered 2021-07-28: 375 mg via ORAL
  Filled 2021-07-28: qty 2

## 2021-07-28 MED ORDER — ACETAMINOPHEN 325 MG PO TABS
650.0000 mg | ORAL_TABLET | Freq: Once | ORAL | Status: AC
  Administered 2021-07-28: 650 mg via ORAL
  Filled 2021-07-28: qty 2

## 2021-07-28 NOTE — ED Provider Notes (Signed)
? ?  [facility] EMERGENCY DEPARTMENT  ?Provider Note ? ?CSN: [id number] ?Arrival date & time: 07/28/21 0155 ? ?History ?Chief Complaint  ?Patient presents with  ? Leg Pain  ? ? ?Betty Henderson is a 61 y.o. female with chronic pain, frequent ED visits for same as well as malingering. Called EMS from [facility] for leg pain, similar to previous. No acute complaints. Ambulated in from ambulance.  ? ? ?Home Medications ?Prior to Admission medications   ?Medication Sig Start Date End Date Taking? Authorizing Provider  ?ranitidine (ZANTAC) 150 MG capsule Take 1 capsule (150 mg total) by mouth daily. ?Patient not taking: Reported on 03/20/2018 05/31/17 01/07/19  Lorre Nick, MD  ? ? ? ?Allergies    ?Other, Lemon oil, Proanthocyanidin, Strawberry extract, Adhesive  [tape], and Cherry ? ? ?Review of Systems   ?Review of Systems ?Please see HPI for pertinent positives and negatives ? ?Physical Exam ?BP (!) 111/99 (BP Location: Right Arm)   Pulse 68   Temp 98.2 ?F (36.8 ?C) (Oral)   Resp 17   Wt 60.7 kg   SpO2 100%   BMI 21.60 kg/m?  ? ?Physical Exam ?Vitals and nursing note reviewed.  ?HENT:  ?   Head: Normocephalic.  ?   Nose: Nose normal.  ?Eyes:  ?   Extraocular Movements: Extraocular movements intact.  ?Pulmonary:  ?   Effort: Pulmonary effort is normal.  ?Musculoskeletal:     ?   General: Normal range of motion.  ?   Cervical back: Neck supple.  ?Skin: ?   Findings: No rash (on exposed skin).  ?Neurological:  ?   Mental Status: She is alert and oriented to person, place, and time.  ?Psychiatric:     ?   Mood and Affect: Mood normal.  ? ? ?ED Results / Procedures / Treatments   ?EKG ?None ? ?Procedures ?Procedures ? ?Medications Ordered in the ED ?Medications  ?acetaminophen (TYLENOL) tablet 650 mg (has no administration in time range)  ? ? ?Initial Impression and Plan ? Patient given coffee and crackers on arrival. Offered APAP for pain. No acute complaints and no emergent medical condition . ? ?ED Course   ? ?  ? ? ?MDM Rules/Calculators/A&P ?Medical Decision Making ?Problems Addressed: ?Malingering: chronic illness or injury ? ?Risk ?OTC drugs. ? ? ? ?Final Clinical Impression(s) / ED Diagnoses ?Final diagnoses:  ?Malingering  ? ? ?Rx / DC Orders ?ED Discharge Orders   ? ? None  ? ?  ? ?  ?Pollyann Savoy, MD ?07/28/21 [phone] ? ?

## 2021-07-28 NOTE — ED Notes (Signed)
ED Provider at bedside. 

## 2021-07-28 NOTE — Discharge Instructions (Signed)
X-rays confirm that you have arthritis.  No broken bone.  The pain is because of the arthritis.  The pain with walking is because of your arthritis. ? ?Emergency room is for emergency care.  Please follow-up with your primary care doctor at [facility] for further evaluation and management of chronic conditions like your arthritis. ?

## 2021-07-28 NOTE — ED Provider Notes (Signed)
?[facility] EMERGENCY DEPARTMENT ?Provider Note ? ? ?CSN: [id number] ?Arrival date & time: 07/28/21  2831 ? ?  ? ?History ? ?Chief Complaint  ?Patient presents with  ? Hip Pain  ? ? ?Cayleigh Paull is a 61 y.o. female. ? ?HPI ? ?  ? ?61 year old patient comes in with chief complaint of back pain. ? ?Patient indicates that she has been having pain to the hip.  She started having pain after she fell.  She has had multiple falls over the last few days.  Fall is because of pain over her hips. ? ?Multiple ED visits for similar complaints.  ? ?Patient denies any urinary incontinence, urinary retention and has been able to ambulate.  She denies any new numbness or tingling. ? ?Patient states that her PCP is at [facility].  She also admits to being homeless. ?Home Medications ?Prior to Admission medications   ?Medication Sig Start Date End Date Taking? Authorizing Provider  ?ranitidine (ZANTAC) 150 MG capsule Take 1 capsule (150 mg total) by mouth daily. ?Patient not taking: Reported on 03/20/2018 05/31/17 01/07/19  Lorre Nick, MD  ?   ? ?Allergies    ?Other, Lemon oil, Proanthocyanidin, Strawberry extract, Adhesive  [tape], and Cherry   ? ?Review of Systems   ?Review of Systems ? ?Physical Exam ?Updated Vital Signs ?BP 137/87   Pulse 61   Temp 98 ?F (36.7 ?C) (Oral)   Resp 16   Ht 5\' 6"  (1.676 m)   Wt 59 kg   SpO2 100%   BMI 20.98 kg/m?  ?Physical Exam ?Vitals and nursing note reviewed.  ?Constitutional:   ?   Appearance: She is well-developed.  ?HENT:  ?   Head: Atraumatic.  ?Cardiovascular:  ?   Rate and Rhythm: Normal rate.  ?Pulmonary:  ?   Effort: Pulmonary effort is normal.  ?Musculoskeletal:  ?   Cervical back: Normal range of motion and neck supple.  ?   Comments: Lower extremity strength is 4 out of 5 bilaterally. ?1+ patellar reflex bilaterally ?Gross sensory exam is normal for both upper and extremities.  ?Skin: ?   General: Skin is warm and dry.  ?Neurological:  ?   Mental Status: She is alert  and oriented to person, place, and time.  ? ? ?ED Results / Procedures / Treatments   ?Labs ?(all labs ordered are listed, but only abnormal results are displayed) ?Labs Reviewed - No data to display ? ?EKG ?None ? ?Radiology ?DG Hip Unilat W or Wo Pelvis 2-3 Views Left ? ?Result Date: 07/28/2021 ?CLINICAL DATA:  61 year old female status post fall with hip pain. EXAM: DG HIP (WITH OR WITHOUT PELVIS) 2-3V LEFT COMPARISON:  Left hip series 07/24/2021. Left femur series 10/04/2020. FINDINGS: Bilateral hip joint space loss, bulky osteophytosis and subchondral sclerosis. Pelvis appears to remain intact. Grossly intact proximal right femur. Degenerated proximal left femur appears intact. Normal underlying bone mineralization. Nonobstructed bowel-gas pattern with retained stool in the colon. Incidental pelvic phleboliths. IMPRESSION: Advanced bilateral hip joint degeneration. No acute fracture or dislocation identified about the left hip or pelvis. Electronically Signed   By: 10/06/2020 M.D.   On: 07/28/2021 08:35   ? ?Procedures ?Procedures  ? ? ?Medications Ordered in ED ?Medications  ?naproxen (NAPROSYN) tablet 375 mg (has no administration in time range)  ?acetaminophen (TYLENOL) tablet 650 mg (has no administration in time range)  ? ? ?ED Course/ Medical Decision Making/ A&P ?  ?                        ?  Medical Decision Making ?Amount and/or Complexity of Data Reviewed ?Radiology: ordered. ? ?Risk ?OTC drugs. ?Prescription drug management. ? ? ?61 year old patient comes in with chief complaint of back pain. ? ?Patient has history of arthritis based on our records.  She also has the social determinant poor health in the form of being homeless. ? ?Our evaluation is negative for any red flags for cauda equina or cord syndrome.  She denies any infection-like symptoms. ? ?Patient indicates that she has had multiple falls, including 1 today after being discharged.  She is concerned about fracture.  She does not want to walk  unless she knows she does not have a fracture. ? ?I will add x-ray of her hip.  Her exam is reassuring.  I tried to reason with the patient that it is unlikely that she has a fracture.  She was hard to persuade. ? ?8:44 AM ?Xray interpreted independently - no fracture/dislocation of L hip. ? ?Final Clinical Impression(s) / ED Diagnoses ?Final diagnoses:  ?Chronic pain syndrome  ?Chronic arthritis  ?Homeless  ? ? ?Rx / DC Orders ?ED Discharge Orders   ? ? None  ? ?  ? ? ?  ?Derwood Kaplan, MD ?07/28/21 0845 ? ?

## 2021-07-28 NOTE — ED Triage Notes (Signed)
Pt arrives "EMS", steady gait to room with c/o hip pain for "awhile". PT evasive when questioned. ?

## 2021-07-29 ENCOUNTER — Emergency Department (HOSPITAL_BASED_OUTPATIENT_CLINIC_OR_DEPARTMENT_OTHER)
Admission: EM | Admit: 2021-07-29 | Discharge: 2021-07-30 | Disposition: A | Discharge status: Home / Self Care | Payer: Self-pay | Attending: Emergency Medicine | Admitting: Emergency Medicine

## 2021-07-29 ENCOUNTER — Other Ambulatory Visit: Payer: Self-pay

## 2021-07-29 DIAGNOSIS — M79605 Pain in left leg: Secondary | ICD-10-CM | POA: Insufficient documentation

## 2021-07-29 DIAGNOSIS — M5432 Sciatica, left side: Secondary | ICD-10-CM

## 2021-07-29 DIAGNOSIS — M25552 Pain in left hip: Secondary | ICD-10-CM | POA: Insufficient documentation

## 2021-07-29 NOTE — ED Provider Notes (Signed)
?[facility] EM[facility] DEPARTMENT ?Provider Note ? ? ?CSN: [id number] ?Arrival date & time: 07/29/21  2204 ? ?  ? ?History ? ?Chief Complaint  ?Patient presents with  ? Leg Pain  ? Hip Pain  ? ? ?Betty Henderson is a 61 y.o. female. ? ?Presents to the emergency department for evaluation of left hip and leg pain.  This is a chronic problem for the patient.  Patient reports persistent pain, worse with movement.  Denies any new injury.  No change in bowel or bladder function. ? ? ?  ? ?Home Medications ?Prior to Admission medications   ?Medication Sig Start Date End Date Taking? Authorizing Provider  ?ranitidine (ZANTAC) 150 MG capsule Take 1 capsule (150 mg total) by mouth daily. ?Patient not taking: Reported on 03/20/2018 05/31/17 01/07/19  Lorre Nick, MD  ?   ? ?Allergies    ?Other, Lemon oil, Proanthocyanidin, Strawberry extract, Adhesive  [tape], and Cherry   ? ?Review of Systems   ?Review of Systems ? ?Physical Exam ?Updated Vital Signs ?BP 128/61 (BP Location: Right Arm)   Pulse 66   Temp 98.4 ?F (36.9 ?C) (Oral)   Resp 17   Ht 5\' 6"  (1.676 m)   Wt 59 kg   SpO2 98%   BMI 20.99 kg/m?  ?Physical Exam ?Vitals and nursing note reviewed.  ?Constitutional:   ?   General: She is not in acute distress. ?   Appearance: She is well-developed.  ?HENT:  ?   Head: Normocephalic and atraumatic.  ?   Mouth/Throat:  ?   Mouth: Mucous membranes are moist.  ?Eyes:  ?   General: Vision grossly intact. Gaze aligned appropriately.  ?   Extraocular Movements: Extraocular movements intact.  ?   Conjunctiva/sclera: Conjunctivae normal.  ?Cardiovascular:  ?   Rate and Rhythm: Normal rate and regular rhythm.  ?   Pulses: Normal pulses.  ?   Heart sounds: Normal heart sounds, S1 normal and S2 normal. No murmur heard. ?  No friction rub. No gallop.  ?Pulmonary:  ?   Effort: Pulmonary effort is normal. No respiratory distress.  ?   Breath sounds: Normal breath sounds.  ?Abdominal:  ?   General: Bowel sounds are normal.  ?    Palpations: Abdomen is soft.  ?   Tenderness: There is no abdominal tenderness. There is no guarding or rebound.  ?   Hernia: No hernia is present.  ?Musculoskeletal:     ?   General: No swelling.  ?   Cervical back: Full passive range of motion without pain, normal range of motion and neck supple. No spinous process tenderness or muscular tenderness. Normal range of motion.  ?   Lumbar back: Positive right straight leg raise test. Negative left straight leg raise test.  ?   Right lower leg: No edema.  ?   Left lower leg: No edema.  ?Skin: ?   General: Skin is warm and dry.  ?   Capillary Refill: Capillary refill takes less than 2 seconds.  ?   Findings: No ecchymosis, erythema, rash or wound.  ?Neurological:  ?   General: No focal deficit present.  ?   Mental Status: She is alert and oriented to person, place, and time.  ?   GCS: GCS eye subscore is 4. GCS verbal subscore is 5. GCS motor subscore is 6.  ?   Cranial Nerves: Cranial nerves 2-12 are intact.  ?   Sensory: Sensation is intact.  ?  Motor: Motor function is intact.  ?   Coordination: Coordination is intact.  ?   Deep Tendon Reflexes:  ?   Reflex Scores: ?     Patellar reflexes are 1+ on the right side and 1+ on the left side. ?Psychiatric:     ?   Attention and Perception: Attention normal.     ?   Mood and Affect: Mood normal.     ?   Speech: Speech normal.     ?   Behavior: Behavior normal.  ? ? ?ED Results / Procedures / Treatments   ?Labs ?(all labs ordered are listed, but only abnormal results are displayed) ?Labs Reviewed - No data to display ? ?EKG ?None ? ?Radiology ?DG Hip Unilat W or Wo Pelvis 2-3 Views Left ? ?Result Date: 07/28/2021 ?CLINICAL DATA:  61 year old female status post fall with hip pain. EXAM: DG HIP (WITH OR WITHOUT PELVIS) 2-3V LEFT COMPARISON:  Left hip series 07/24/2021. Left femur series 10/04/2020. FINDINGS: Bilateral hip joint space loss, bulky osteophytosis and subchondral sclerosis. Pelvis appears to remain intact.  Grossly intact proximal right femur. Degenerated proximal left femur appears intact. Normal underlying bone mineralization. Nonobstructed bowel-gas pattern with retained stool in the colon. Incidental pelvic phleboliths. IMPRESSION: Advanced bilateral hip joint degeneration. No acute fracture or dislocation identified about the left hip or pelvis. Electronically Signed   By: Odessa Fleming M.D.   On: 07/28/2021 08:35   ? ?Procedures ?Procedures  ? ? ?Medications Ordered in ED ?Medications - No data to display ? ?ED Course/ Medical Decision Making/ A&P ?  ?                        ?Medical Decision Making ? ?Patient presents to the ER with musculoskeletal back pain. Examination reveals back tenderness without any associated neurologic findings. Patient's strength, sensation and reflexes were normal. There is no evidence of saddle anesthesia. Patient does not have a foot drop. Patient has not experienced any change in bowel or bladder function. As such, patient did not require any imaging or further studies. Patient was treated with analgesia. ? ? ? ? ? ? ? ?Final Clinical Impression(s) / ED Diagnoses ?Final diagnoses:  ?Sciatica of left side  ? ? ?Rx / DC Orders ?ED Discharge Orders   ? ? None  ? ?  ? ? ?  ?Gilda Crease, MD ?07/29/21 2334 ? ?

## 2021-07-29 NOTE — ED Triage Notes (Signed)
Pt complaining of left hip pain and left leg pain. Pt unable to tell when pain started. Denies fall or injury. No obvious deformity noted. Pt able to ambulate with steady gait.  ?

## 2021-07-30 MED ORDER — METHYLPREDNISOLONE 4 MG PO TBPK: ORAL_TABLET | ORAL | 0 refills | Status: DC

## 2021-07-30 NOTE — ED Notes (Signed)
Pt given coffee. °

## 2021-07-30 NOTE — ED Notes (Signed)
Written and verbal inst to pt  Verbalized an understanding   ?

## 2021-07-30 NOTE — ED Notes (Signed)
Decaf coffee and shasta soda given ?

## 2021-08-13 ENCOUNTER — Emergency Department (HOSPITAL_BASED_OUTPATIENT_CLINIC_OR_DEPARTMENT_OTHER)
Admission: EM | Admit: 2021-08-13 | Discharge: 2021-08-14 | Disposition: A | Discharge status: Home / Self Care | Payer: Self-pay | Attending: Emergency Medicine | Admitting: Emergency Medicine

## 2021-08-13 ENCOUNTER — Encounter (HOSPITAL_BASED_OUTPATIENT_CLINIC_OR_DEPARTMENT_OTHER): Payer: Self-pay | Admitting: Emergency Medicine

## 2021-08-13 ENCOUNTER — Other Ambulatory Visit: Payer: Self-pay

## 2021-08-13 DIAGNOSIS — R220 Localized swelling, mass and lump, head: Secondary | ICD-10-CM

## 2021-08-13 DIAGNOSIS — K061 Gingival enlargement: Secondary | ICD-10-CM | POA: Insufficient documentation

## 2021-08-13 NOTE — ED Triage Notes (Signed)
Patient reports she has an infection in her gums x 1 month. Patient also c/o left hip pain states she has chronic arthritis.

## 2021-08-13 NOTE — ED Notes (Signed)
Call for pt, no answer 

## 2021-08-14 MED ORDER — AMOXICILLIN 500 MG PO CAPS
500.0000 mg | ORAL_CAPSULE | Freq: Once | ORAL | Status: AC
  Administered 2021-08-14: 500 mg via ORAL
  Filled 2021-08-14: qty 1

## 2021-08-14 MED ORDER — AMOXICILLIN 500 MG PO CAPS: 500.0000 mg | ORAL_CAPSULE | Freq: Three times a day (TID) | ORAL | 0 refills | Status: DC

## 2021-08-14 NOTE — ED Provider Notes (Signed)
[facility] EMERGENCY DEPARTMENT Provider Note   CSN: [id number] Arrival date & time: 08/13/21  2050     History  Chief Complaint  Patient presents with   Oral Swelling    Betty Henderson is a 61 y.o. female.  The history is provided by the patient.  She has history of schizoaffective disorder and comes in complaining patient comes on the left side of her mouth.  She is an extremely poor historian and tends to have flight of ideas well during history, but this seems to be the main reason that she came.  She does state that she was unable to get to [facility] where she normally goes.   Home Medications Prior to Admission medications   Medication Sig Start Date End Date Taking? Authorizing Provider  methylPREDNISolone (MEDROL DOSEPAK) 4 MG TBPK tablet As directed 07/30/21   Pollina, Canary Brim, MD  ranitidine (ZANTAC) 150 MG capsule Take 1 capsule (150 mg total) by mouth daily. Patient not taking: Reported on 03/20/2018 05/31/17 01/07/19  Lorre Nick, MD      Allergies    Other, Lemon oil, Proanthocyanidin, Strawberry extract, Adhesive  [tape], and Cherry    Review of Systems   Review of Systems  All other systems reviewed and are negative.  Physical Exam Updated Vital Signs BP 122/63 (BP Location: Right Arm)   Pulse 66   Temp 98.4 F (36.9 C) (Oral)   Resp 17   Ht 5\' 6"  (1.676 m)   Wt 61.2 kg   SpO2 99%   BMI 21.79 kg/m  Physical Exam Vitals and nursing note reviewed.  61 year old female, resting comfortably and in no acute distress. Vital signs are normal. Oxygen saturation is 99%, which is normal. Head is normocephalic and atraumatic. PERRLA, EOMI. dentition is very poor and there has been multiple dental extractions.  There is no obvious gingival swelling no obvious active dental caries. Neck is nontender and supple without adenopathy or JVD. Back is nontender and there is no CVA tenderness. Lungs are clear without rales,  wheezes, or rhonchi. Chest is nontender. Heart has regular rate and rhythm without murmur. Abdomen is soft, flat, nontender. Extremities have no cyanosis or edema, full range of motion is present. Skin is warm and dry without rash. Neurologic: Awake and alert, cranial nerves are intact, moves all extremities. Psychiatric: Flight of ideas noted.  ED Results / Procedures / Treatments    Procedures Procedures    Medications Ordered in ED Medications  amoxicillin (AMOXIL) capsule 500 mg (has no administration in time range)    ED Course/ Medical Decision Making/ A&P                           Medical Decision Making  Gingival pain without any obvious findings on exam.  Old records are reviewed, and she actually is having ED visits the prior 2 days for the same complaint.  She has been given prescriptions for antibiotics which she has apparently not filled.  She is given a dose of amoxicillin and is given a prescription for a 1 week course of amoxicillin and is referred to on-call dentistry.  However, given her past history, I doubt that she will follow-up.  She is also referred to [facility] for management of her psychiatric issues.  Final Clinical Impression(s) / ED Diagnoses Final diagnoses:  Gingival swelling    Rx / DC Orders ED Discharge Orders  Ordered    amoxicillin (AMOXIL) 500 MG capsule  3 times daily        08/14/21 0227              Dione Booze, MD 08/14/21 0236

## 2021-08-17 ENCOUNTER — Emergency Department (HOSPITAL_BASED_OUTPATIENT_CLINIC_OR_DEPARTMENT_OTHER): Payer: Self-pay

## 2021-08-17 ENCOUNTER — Encounter (HOSPITAL_BASED_OUTPATIENT_CLINIC_OR_DEPARTMENT_OTHER): Payer: Self-pay | Admitting: Emergency Medicine

## 2021-08-17 ENCOUNTER — Emergency Department (HOSPITAL_BASED_OUTPATIENT_CLINIC_OR_DEPARTMENT_OTHER)
Admission: EM | Admit: 2021-08-17 | Discharge: 2021-08-17 | Disposition: A | Discharge status: Home / Self Care | Payer: Self-pay | Attending: Emergency Medicine | Admitting: Emergency Medicine

## 2021-08-17 ENCOUNTER — Other Ambulatory Visit: Payer: Self-pay

## 2021-08-17 DIAGNOSIS — G894 Chronic pain syndrome: Secondary | ICD-10-CM

## 2021-08-17 DIAGNOSIS — R5383 Other fatigue: Secondary | ICD-10-CM | POA: Insufficient documentation

## 2021-08-17 DIAGNOSIS — M25552 Pain in left hip: Secondary | ICD-10-CM | POA: Insufficient documentation

## 2021-08-17 DIAGNOSIS — Z5900 Homelessness unspecified: Secondary | ICD-10-CM

## 2021-08-17 DIAGNOSIS — K0889 Other specified disorders of teeth and supporting structures: Secondary | ICD-10-CM

## 2021-08-17 MED ORDER — PENICILLIN V POTASSIUM 500 MG PO TABS: 500.0000 mg | ORAL_TABLET | Freq: Four times a day (QID) | ORAL | 0 refills | Status: AC

## 2021-08-17 MED ORDER — ACETAMINOPHEN 325 MG PO TABS
650.0000 mg | ORAL_TABLET | Freq: Once | ORAL | Status: AC
  Administered 2021-08-17: 650 mg via ORAL
  Filled 2021-08-17: qty 2

## 2021-08-17 NOTE — ED Provider Notes (Signed)
[facility] EMERGENCY DEPARTMENT Provider Note   CSN: [id number] Arrival date & time: 08/17/21  0327     History  No chief complaint on file.   Betty Henderson is a 61 y.o. female.  The history is provided by the patient and the EMS personnel.  Betty Henderson is a 61 y.o. female who presents to the Emergency Department complaining of tired and need a place to stay.  She presents to the emergency department by EMS for evaluation of needing a place to stay for the night.  [employer] was called out to sheets because patient was there trespassing.  When [employer] arrived patient reports feeling very fatigued and wanting evaluated in the emergency department for a place to stay.  Patient complains of chronic pain to the left hip.  She states she is getting followed up in [place] for this.  No reports of new injuries.    Home Medications Prior to Admission medications   Medication Sig Start Date End Date Taking? Authorizing Provider  amoxicillin (AMOXIL) 500 MG capsule Take 1 capsule (500 mg total) by mouth 3 (three) times daily. 08/14/21   Dione Booze, MD  ranitidine (ZANTAC) 150 MG capsule Take 1 capsule (150 mg total) by mouth daily. Patient not taking: Reported on 03/20/2018 05/31/17 01/07/19  Lorre Nick, MD      Allergies    Other, Lemon oil, Proanthocyanidin, Strawberry extract, Adhesive  [tape], and Cherry    Review of Systems   Review of Systems  All other systems reviewed and are negative.  Physical Exam Updated Vital Signs There were no vitals taken for this visit. Physical Exam Vitals and nursing note reviewed.  Constitutional:      Appearance: She is well-developed.  HENT:     Head: Normocephalic and atraumatic.     Comments: Poor dentition Cardiovascular:     Rate and Rhythm: Normal rate and regular rhythm.  Pulmonary:     Effort: Pulmonary effort is normal. No respiratory distress.  Abdominal:     Palpations: Abdomen is soft.     Tenderness: There  is no abdominal tenderness. There is no guarding or rebound.  Musculoskeletal:     Comments: Mild tenderness to palpation over the left hip.  She is able to ambulate without assistance.  No pain on passive range of motion to the left hip.  Skin:    General: Skin is warm and dry.  Neurological:     Mental Status: She is alert and oriented to person, place, and time.  Psychiatric:        Behavior: Behavior normal.    ED Results / Procedures / Treatments   Labs (all labs ordered are listed, but only abnormal results are displayed) Labs Reviewed - No data to display  EKG None  Radiology No results found.  Procedures Procedures    Medications Ordered in ED Medications - No data to display  ED Course/ Medical Decision Making/ A&P                           Medical Decision Making Risk OTC drugs.   Patient here after [employer] being called for trespassing.  She stated to EMS she needed a place to stay for tonight.  She does complain of chronic hip pain, no additional complaints.  She is slightly disheveled on examination with no acute distress.  She has chronic pain to the hip and is able to ambulate with a steady gait.Marland Kitchen  She denies new injuries to myself.  She did state to triage nurse that her leg snapped out of place.  Do not feel that this is likely given her ability to ambulate without difficulty.  Do not feel additional imaging is warranted at this time.  No current evidence of acute fracture, dislocation, septic arthritis.        Final Clinical Impression(s) / ED Diagnoses Final diagnoses:  None    Rx / DC Orders ED Discharge Orders     None         Tilden Fossa, MD 08/17/21 [phone]

## 2021-08-17 NOTE — ED Triage Notes (Signed)
Left bottom jaw pain, was seen at [facility] but not given anything for it.

## 2021-08-17 NOTE — ED Triage Notes (Signed)
Pt states fell and left leg snapped out of place.

## 2021-08-17 NOTE — Discharge Instructions (Signed)
Your hip x-ray is negative.  Take the antibiotics as prescribed for your teeth and follow-up with your primary care doctor and the dentist.  Return to the ED with difficulty breathing, difficulty swallowing, chest pain, shortness of breath, any other concerns

## 2021-08-17 NOTE — ED Provider Notes (Incomplete)
[facility] EMERGENCY DEPARTMENT Provider Note   CSN: [id number] Arrival date & time: 08/17/21  2007     History {Add pertinent medical, surgical, social history, OB history to HPI:1} Chief Complaint  Patient presents with   Dental Pain    Betty Henderson is a 61 y.o. female.  Patient with a history of schizoaffective disorder, chronic pain presenting with left hip pain.  She was seen last night for the same.  She is requesting an x-ray today.  States she is having difficulty walking and increased pain with denies any new trauma since last night.  She is also complaining of left lower dental pain and feels like her gingiva is infected.  Denies any bleeding or discharge.  Denies any difficulty breathing or difficulty swallowing.  No chest pain or shortness of breath. States she had a "episode" earlier today at [employer] where her leg gave out from underneath her and she thinks she may have passed out.  Denies hitting her head. She is very tangential with history and has difficulty telling the story  The history is provided by the patient. The history is limited by the condition of the patient.  Dental Pain Associated symptoms: no fever and no headaches       Home Medications Prior to Admission medications   Medication Sig Start Date End Date Taking? Authorizing Provider  amoxicillin (AMOXIL) 500 MG capsule Take 1 capsule (500 mg total) by mouth 3 (three) times daily. 08/14/21   Dione Booze, MD  ranitidine (ZANTAC) 150 MG capsule Take 1 capsule (150 mg total) by mouth daily. Patient not taking: Reported on 03/20/2018 05/31/17 01/07/19  Lorre Nick, MD      Allergies    Other, Lemon oil, Proanthocyanidin, Strawberry extract, Adhesive  [tape], and Cherry    Review of Systems   Review of Systems  Constitutional:  Negative for activity change and fever.  HENT:  Positive for dental problem.   Respiratory:  Negative for cough, chest tightness and shortness of  breath.   Gastrointestinal:  Negative for abdominal pain, nausea and vomiting.  Musculoskeletal:  Positive for arthralgias and myalgias.  Skin:  Negative for rash.  Neurological:  Positive for syncope. Negative for dizziness, weakness and headaches.   all other systems are negative except as noted in the HPI and PMH.   Physical Exam Updated Vital Signs BP 121/74 (BP Location: Right Arm)   Pulse 71   Temp 98.6 F (37 C) (Oral)   Resp 20   Ht 5\' 6"  (1.676 m)   Wt 57.6 kg   SpO2 100%   BMI 20.50 kg/m  Physical Exam Vitals and nursing note reviewed.  Constitutional:      General: She is not in acute distress.    Appearance: She is well-developed.  HENT:     Head: Normocephalic and atraumatic.     Mouth/Throat:     Pharynx: No oropharyngeal exudate.     Comments: Dentition in poor repair throughout, multiple missing teeth.  Floor mouth is soft. No trismus or malocclusion Eyes:     Conjunctiva/sclera: Conjunctivae normal.     Pupils: Pupils are equal, round, and reactive to light.  Neck:     Comments: No meningismus. Cardiovascular:     Rate and Rhythm: Normal rate and regular rhythm.     Heart sounds: Normal heart sounds. No murmur heard. Pulmonary:     Effort: Pulmonary effort is normal. No respiratory distress.     Breath sounds: Normal breath  sounds.  Abdominal:     Palpations: Abdomen is soft.     Tenderness: There is no abdominal tenderness. There is no guarding or rebound.  Musculoskeletal:        General: Tenderness present. Normal range of motion.     Cervical back: Normal range of motion and neck supple.     Comments: No shortening or external rotation.  Pain with range of motion of left hip.  Intact DP and PT pulses  Skin:    General: Skin is warm.  Neurological:     Mental Status: She is alert and oriented to person, place, and time.     Cranial Nerves: No cranial nerve deficit.     Motor: No abnormal muscle tone.     Coordination: Coordination normal.      Comments:  5/5 strength throughout. CN 2-12 intact.Equal grip strength.   Psychiatric:        Behavior: Behavior normal.    ED Results / Procedures / Treatments   Labs (all labs ordered are listed, but only abnormal results are displayed) Labs Reviewed - No data to display  EKG None  Radiology No results found.  Procedures Procedures  {Document cardiac monitor, telemetry assessment procedure when appropriate:1}  Medications Ordered in ED Medications - No data to display  ED Course/ Medical Decision Making/ A&P                           Medical Decision Making Amount and/or Complexity of Data Reviewed Radiology: ordered. ECG/medicine tests: ordered.  Patient is a poor historian.  She complains of worsening left hip pain and dental pain.  No evidence of Ludwig's angina  {Document critical care time when appropriate:1} {Document review of labs and clinical decision tools ie heart score, Chads2Vasc2 etc:1}  {Document your independent review of radiology images, and any outside records:1} {Document your discussion with family members, caretakers, and with consultants:1} {Document social determinants of health affecting pt's care:1} {Document your decision making why or why not admission, treatments were needed:1} Final Clinical Impression(s) / ED Diagnoses Final diagnoses:  None    Rx / DC Orders ED Discharge Orders     None

## 2021-08-25 ENCOUNTER — Encounter (HOSPITAL_BASED_OUTPATIENT_CLINIC_OR_DEPARTMENT_OTHER): Payer: Self-pay | Admitting: Emergency Medicine

## 2021-08-25 ENCOUNTER — Other Ambulatory Visit: Payer: Self-pay

## 2021-08-25 DIAGNOSIS — Z5321 Procedure and treatment not carried out due to patient leaving prior to being seen by health care provider: Secondary | ICD-10-CM | POA: Insufficient documentation

## 2021-08-25 DIAGNOSIS — M25552 Pain in left hip: Secondary | ICD-10-CM | POA: Insufficient documentation

## 2021-08-25 NOTE — ED Triage Notes (Signed)
Pt c/o left hip pain from previous injury.

## 2021-08-26 ENCOUNTER — Encounter (HOSPITAL_BASED_OUTPATIENT_CLINIC_OR_DEPARTMENT_OTHER): Payer: Self-pay

## 2021-08-26 ENCOUNTER — Emergency Department (HOSPITAL_BASED_OUTPATIENT_CLINIC_OR_DEPARTMENT_OTHER)
Admission: EM | Admit: 2021-08-26 | Discharge: 2021-08-26 | Disposition: A | Discharge status: Home / Self Care | Payer: Self-pay | Attending: Emergency Medicine | Admitting: Emergency Medicine

## 2021-08-26 DIAGNOSIS — M25552 Pain in left hip: Secondary | ICD-10-CM | POA: Insufficient documentation

## 2021-08-26 MED ORDER — IBUPROFEN 400 MG PO TABS
600.0000 mg | ORAL_TABLET | Freq: Once | ORAL | Status: AC
  Administered 2021-08-26: 600 mg via ORAL
  Filled 2021-08-26: qty 1

## 2021-08-26 NOTE — ED Provider Notes (Signed)
  [facility] EMERGENCY DEPARTMENT Provider Note   CSN: [id number] Arrival date & time: 08/26/21  0524     History  Chief Complaint  Patient presents with   Hip Pain    Betty Henderson is a 61 y.o. female.  Patient is a 61 year old female with history of schizoaffective disorder.  She is well-known to the emergency department for frequent visits.  She presents today with complaints of left hip pain.  This is a chronic problem and she has been seen on multiple occasions for the same.  She denies any new injury or trauma.  The history is provided by the patient.      Home Medications Prior to Admission medications   Medication Sig Start Date End Date Taking? Authorizing Provider  ranitidine (ZANTAC) 150 MG capsule Take 1 capsule (150 mg total) by mouth daily. Patient not taking: Reported on 03/20/2018 05/31/17 01/07/19  Lorre Nick, MD      Allergies    Other, Lemon oil, Proanthocyanidin, Strawberry extract, Adhesive  [tape], and Cherry    Review of Systems   Review of Systems  All other systems reviewed and are negative.  Physical Exam Updated Vital Signs BP 117/67   Pulse 65   Temp 97.6 F (36.4 C) (Oral)   Resp 18   SpO2 98%  Physical Exam Vitals and nursing note reviewed.  Constitutional:      General: She is not in acute distress.    Appearance: Normal appearance. She is not ill-appearing.  HENT:     Head: Normocephalic and atraumatic.  Pulmonary:     Effort: Pulmonary effort is normal.  Musculoskeletal:     Comments: Left hip appears grossly normal.  She has good range of motion with no discomfort.  The leg is neurovascularly intact.  Skin:    General: Skin is warm and dry.  Neurological:     Mental Status: She is alert.    ED Results / Procedures / Treatments   Labs (all labs ordered are listed, but only abnormal results are displayed) Labs Reviewed - No data to display  EKG None  Radiology No results  found.  Procedures Procedures    Medications Ordered in ED Medications  ibuprofen (ADVIL) tablet 600 mg (has no administration in time range)    ED Course/ Medical Decision Making/ A&P  Patient presenting here with complaints of hip pain.  She is a well-known frequent visitor of the emergency department both here and surrounding facilities.  She has a history of malingering and homelessness.  Patient will be given ibuprofen and advised to follow-up as needed.  Final Clinical Impression(s) / ED Diagnoses Final diagnoses:  None    Rx / DC Orders ED Discharge Orders     None         Geoffery Lyons, MD 08/26/21 [phone]

## 2021-08-26 NOTE — ED Notes (Signed)
Pt not in room and not located in the lobby

## 2021-08-26 NOTE — Discharge Instructions (Signed)
Take ibuprofen 600 mg every 6 hours as needed for pain.  Follow-up with your primary doctor. 

## 2021-08-26 NOTE — ED Triage Notes (Signed)
Pt states that her L hip and been hurting for a while, denies injury, pt ambulatory

## 2021-08-26 NOTE — ED Notes (Signed)
Pt given coffee, cracker and peanut butter

## 2021-08-26 NOTE — ED Notes (Signed)
When patient asked if she's ready to come back and be seen by the doctor she replied "Hell no." And exited the department.

## 2021-08-27 ENCOUNTER — Ambulatory Visit: Payer: Self-pay | Admitting: Nurse Practitioner

## 2021-09-06 ENCOUNTER — Emergency Department (HOSPITAL_BASED_OUTPATIENT_CLINIC_OR_DEPARTMENT_OTHER)
Admission: EM | Admit: 2021-09-06 | Discharge: 2021-09-07 | Disposition: A | Discharge status: Home / Self Care | Payer: Self-pay | Attending: Emergency Medicine | Admitting: Emergency Medicine

## 2021-09-06 ENCOUNTER — Encounter (HOSPITAL_BASED_OUTPATIENT_CLINIC_OR_DEPARTMENT_OTHER): Payer: Self-pay | Admitting: Urology

## 2021-09-06 ENCOUNTER — Encounter (HOSPITAL_COMMUNITY): Payer: Self-pay

## 2021-09-06 ENCOUNTER — Other Ambulatory Visit: Payer: Self-pay

## 2021-09-06 ENCOUNTER — Emergency Department (HOSPITAL_COMMUNITY)
Admission: EM | Admit: 2021-09-06 | Discharge: 2021-09-06 | Discharge status: Against Medical Advice | Payer: Self-pay | Attending: Emergency Medicine | Admitting: Emergency Medicine

## 2021-09-06 DIAGNOSIS — W501XXA Accidental kick by another person, initial encounter: Secondary | ICD-10-CM | POA: Insufficient documentation

## 2021-09-06 DIAGNOSIS — M25562 Pain in left knee: Secondary | ICD-10-CM | POA: Insufficient documentation

## 2021-09-06 DIAGNOSIS — R82998 Other abnormal findings in urine: Secondary | ICD-10-CM | POA: Insufficient documentation

## 2021-09-06 DIAGNOSIS — Z5900 Homelessness unspecified: Secondary | ICD-10-CM | POA: Insufficient documentation

## 2021-09-06 DIAGNOSIS — M25561 Pain in right knee: Secondary | ICD-10-CM | POA: Insufficient documentation

## 2021-09-06 DIAGNOSIS — R300 Dysuria: Secondary | ICD-10-CM | POA: Insufficient documentation

## 2021-09-06 DIAGNOSIS — R350 Frequency of micturition: Secondary | ICD-10-CM | POA: Insufficient documentation

## 2021-09-06 DIAGNOSIS — Z5321 Procedure and treatment not carried out due to patient leaving prior to being seen by health care provider: Secondary | ICD-10-CM | POA: Insufficient documentation

## 2021-09-06 DIAGNOSIS — R32 Unspecified urinary incontinence: Secondary | ICD-10-CM | POA: Insufficient documentation

## 2021-09-06 DIAGNOSIS — Z765 Malingerer [conscious simulation]: Secondary | ICD-10-CM | POA: Insufficient documentation

## 2021-09-06 LAB — URINALYSIS, ROUTINE W REFLEX MICROSCOPIC
Bacteria, UA: NONE SEEN
Bilirubin Urine: NEGATIVE
Glucose, UA: NEGATIVE mg/dL
Hgb urine dipstick: NEGATIVE
Ketones, ur: NEGATIVE mg/dL
Nitrite: NEGATIVE
Protein, ur: NEGATIVE mg/dL
Specific Gravity, Urine: 1.014 (ref 1.005–1.030)
pH: 6 (ref 5.0–8.0)
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
## 2021-09-06 NOTE — ED Notes (Signed)
Pt called repeatedly to be roomed, no response  

## 2021-09-06 NOTE — ED Notes (Signed)
PATIENT LEFT WITHOUT BEING SEE

## 2021-09-06 NOTE — ED Triage Notes (Signed)
Pt to ED via [facility].  Pt c/o pain all over, dysuria, urinary frequency, and odorous urination.  Pt has also had urinary incontinence.   EMS VS 112/64 68bpm 98% Cbg=110

## 2021-09-06 NOTE — ED Notes (Addendum)
Pt called to be roomed, no response. Will call again.

## 2021-09-06 NOTE — ED Notes (Signed)
Patient given cup of coffee per request.  
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
## 2021-09-07 NOTE — Discharge Instructions (Signed)
Take ibuprofen 600 mg every 6 hours as needed for pain.  Follow-up with your primary doctor if not improving in the next week.

## 2021-09-07 NOTE — ED Notes (Signed)
Patient given another cup of coffee, graham crackers and peanut butter and socks for discharge.  Patient ambulated with a steady gait while getting dressed.

## 2021-09-07 NOTE — ED Notes (Signed)
Patient sleeping, respirations even and unlabored, in NAD.  Call bell within reach.

## 2021-09-11 ENCOUNTER — Other Ambulatory Visit: Payer: Self-pay

## 2021-09-11 ENCOUNTER — Emergency Department (HOSPITAL_COMMUNITY)
Admission: EM | Admit: 2021-09-11 | Discharge: 2021-09-12 | Disposition: A | Discharge status: Home / Self Care | Payer: Self-pay | Attending: Emergency Medicine | Admitting: Emergency Medicine

## 2021-09-11 ENCOUNTER — Encounter (HOSPITAL_COMMUNITY): Payer: Self-pay

## 2021-09-11 DIAGNOSIS — X31XXXA Exposure to excessive natural cold, initial encounter: Secondary | ICD-10-CM | POA: Insufficient documentation

## 2021-09-11 DIAGNOSIS — T69021A Immersion foot, right foot, initial encounter: Secondary | ICD-10-CM | POA: Insufficient documentation

## 2021-09-11 DIAGNOSIS — Z5900 Homelessness unspecified: Secondary | ICD-10-CM | POA: Insufficient documentation

## 2021-09-11 DIAGNOSIS — J00 Acute nasopharyngitis [common cold]: Secondary | ICD-10-CM | POA: Insufficient documentation

## 2021-09-11 DIAGNOSIS — T69029A Immersion foot, unspecified foot, initial encounter: Secondary | ICD-10-CM

## 2021-09-11 DIAGNOSIS — T69022A Immersion foot, left foot, initial encounter: Secondary | ICD-10-CM | POA: Insufficient documentation

## 2021-09-11 NOTE — ED Triage Notes (Addendum)
Pt reports she is cold stating her feet and arms are cold. She is homeless. She also reports back pain.

## 2021-09-11 NOTE — ED Provider Triage Note (Signed)
Emergency Medicine Provider Triage Evaluation Note  Betty Henderson , a 61 y.o. female  was evaluated in triage.  Pt complains of back pain onset today.  Patient notes that she walks for prolonged periods of time.  Denies recent injury, trauma, fall.  Review of Systems  Positive: As per HPI above Negative:   Physical Exam  BP 122/68 (BP Location: Right Arm)   Pulse 72   Temp 98.6 F (37 C) (Oral)   Resp 20   Ht 5\' 6"  (1.676 m)   Wt 61.2 kg   SpO2 100%   BMI 21.79 kg/m  Gen:   Awake, no distress   Resp:  Normal effort  MSK:   Moves extremities without difficulty  Other:  No spinal tenderness to palpation.  No tenderness to palpation noted to musculature of back.  Medical Decision Making  Medically screening exam initiated at 6:42 PM.  Appropriate orders placed.  Sherrise Liberto was informed that the remainder of the evaluation will be completed by another provider, this initial triage assessment does not replace that evaluation, and the importance of remaining in the ED until their evaluation is complete.  Work-up initiated   Orvie Caradine A, PA-C 09/11/21 1842

## 2021-09-12 ENCOUNTER — Emergency Department (HOSPITAL_COMMUNITY)
Admission: EM | Admit: 2021-09-12 | Discharge: 2021-09-12 | Disposition: A | Discharge status: Home / Self Care | Payer: Self-pay | Attending: Emergency Medicine | Admitting: Emergency Medicine

## 2021-09-12 ENCOUNTER — Other Ambulatory Visit: Payer: Self-pay

## 2021-09-12 ENCOUNTER — Encounter (HOSPITAL_COMMUNITY): Payer: Self-pay

## 2021-09-12 DIAGNOSIS — G8929 Other chronic pain: Secondary | ICD-10-CM | POA: Insufficient documentation

## 2021-09-12 DIAGNOSIS — Z5900 Homelessness unspecified: Secondary | ICD-10-CM | POA: Insufficient documentation

## 2021-09-12 DIAGNOSIS — K0889 Other specified disorders of teeth and supporting structures: Secondary | ICD-10-CM | POA: Insufficient documentation

## 2021-09-12 MED ORDER — KETOROLAC TROMETHAMINE 30 MG/ML IJ SOLN
30.0000 mg | Freq: Once | INTRAMUSCULAR | Status: AC
  Administered 2021-09-12: 30 mg via INTRAMUSCULAR
  Filled 2021-09-12: qty 1

## 2021-09-12 NOTE — ED Provider Notes (Signed)
[facility] EMERGENCY DEPARTMENT Provider Note   CSN: [id number] Arrival date & time: 09/12/21  1109     History PMH: malingering, chronic pain, schizoaffective disorder, drug seeking behavior Chief Complaint  Patient presents with   Medical Clearance    Betty Henderson is a 61 y.o. female. Patient presents to the emergency department [place].  She was advised that she could not do that so she told them to call EMS.  She had no complaints per EMS.  When I spoke with the patient, she states that she does have some dental pain on the lower right side that has been going on for about 1 month now.  She has no facial swelling, fevers, chills, or difficulty swallowing.  She is having no trouble breathing.  She also complains of various body aches in her body have been ongoing for several months to years.  HPI     Home Medications Prior to Admission medications   Medication Sig Start Date End Date Taking? Authorizing Provider  ranitidine (ZANTAC) 150 MG capsule Take 1 capsule (150 mg total) by mouth daily. Patient not taking: Reported on 03/20/2018 05/31/17 01/07/19  Lorre Nick, MD      Allergies    Other, Lemon oil, Proanthocyanidin, Strawberry extract, Adhesive  [tape], and Cherry    Review of Systems   Review of Systems  HENT:  Positive for dental problem.   All other systems reviewed and are negative.   Physical Exam Updated Vital Signs BP 128/61   Pulse (!) 54   Temp (!) 97.5 F (36.4 C) (Oral)   Resp 14   SpO2 99%  Physical Exam Vitals and nursing note reviewed.  Constitutional:      General: She is not in acute distress.    Appearance: Normal appearance. She is well-developed. She is not ill-appearing, toxic-appearing or diaphoretic.  HENT:     Head: Normocephalic and atraumatic.     Nose: No nasal deformity.     Mouth/Throat:     Lips: Pink. No lesions.     Comments: Poor Dentition throughout. No  specific tender location in mouth. No evidence of any dental abscess. No facial swelling. No evidence of double tongue, or induration of submandibular space.  Eyes:     General: Gaze aligned appropriately. No scleral icterus.       Right eye: No discharge.        Left eye: No discharge.     Conjunctiva/sclera: Conjunctivae normal.     Right eye: Right conjunctiva is not injected. No exudate or hemorrhage.    Left eye: Left conjunctiva is not injected. No exudate or hemorrhage. Pulmonary:     Effort: Pulmonary effort is normal. No respiratory distress.  Skin:    General: Skin is warm and dry.  Neurological:     Mental Status: She is alert and oriented to person, place, and time.  Psychiatric:        Mood and Affect: Mood normal.        Speech: Speech normal.        Behavior: Behavior normal. Behavior is cooperative.     ED Results / Procedures / Treatments   Labs (all labs ordered are listed, but only abnormal results are displayed) Labs Reviewed - No data to display  EKG None  Radiology No results found.  Procedures Procedures   Medications Ordered in ED Medications  ketorolac (TORADOL) 30 MG/ML injection 30 mg (has no administration  in time range)    ED Course/ Medical Decision Making/ A&P                           Medical Decision Making Risk Prescription drug management.   Patient here for dental pain [place]  She has no signs of any abscess or deep space infection on exam.  No specifically tender tooth on palpation. She does have overall poor dentition. I do not think that she requires antibiotics. Her vitals are stable.  She appears to be in no acute distress and has no other complaints.  We will give her dose of Toradol here.  Plan for discharge follow-up with primary care.   Final Clinical Impression(s) / ED Diagnoses Final diagnoses:  Chronic dental pain  Homeless    Rx / DC Orders ED Discharge Orders     None          Claudie Leach, PA-C 09/12/21 1511    Terrilee Files, MD 09/12/21 1752
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
## 2021-09-12 NOTE — Discharge Instructions (Signed)
Try to keep your feet as dry as possible. Use clean, dry socks I have given you. Follow-up with your doctor.

## 2021-09-12 NOTE — ED Triage Notes (Signed)
[place] [facility] [place]. Pt is homeless and was sleeping [place] train station and was advised she could not do that so she talked them to call EMS. Per EMS she does not have any complaints just stated she wanted to come here.

## 2021-10-14 ENCOUNTER — Emergency Department (HOSPITAL_BASED_OUTPATIENT_CLINIC_OR_DEPARTMENT_OTHER)
Admission: EM | Admit: 2021-10-14 | Discharge: 2021-10-15 | Disposition: A | Discharge status: Home / Self Care | Payer: Self-pay | Attending: Emergency Medicine | Admitting: Emergency Medicine

## 2021-10-14 ENCOUNTER — Encounter (HOSPITAL_BASED_OUTPATIENT_CLINIC_OR_DEPARTMENT_OTHER): Payer: Self-pay | Admitting: Emergency Medicine

## 2021-10-14 ENCOUNTER — Emergency Department (HOSPITAL_BASED_OUTPATIENT_CLINIC_OR_DEPARTMENT_OTHER)
Admission: EM | Admit: 2021-10-14 | Discharge: 2021-10-14 | Disposition: A | Discharge status: Home / Self Care | Payer: Self-pay | Attending: Emergency Medicine | Admitting: Emergency Medicine

## 2021-10-14 ENCOUNTER — Encounter (HOSPITAL_BASED_OUTPATIENT_CLINIC_OR_DEPARTMENT_OTHER): Payer: Self-pay

## 2021-10-14 ENCOUNTER — Other Ambulatory Visit: Payer: Self-pay

## 2021-10-14 DIAGNOSIS — M25559 Pain in unspecified hip: Secondary | ICD-10-CM | POA: Insufficient documentation

## 2021-10-14 DIAGNOSIS — G8929 Other chronic pain: Secondary | ICD-10-CM | POA: Insufficient documentation

## 2021-10-14 DIAGNOSIS — M25569 Pain in unspecified knee: Secondary | ICD-10-CM | POA: Insufficient documentation

## 2021-10-14 MED ORDER — ACETAMINOPHEN 500 MG PO TABS
1000.0000 mg | ORAL_TABLET | Freq: Once | ORAL | Status: AC
  Administered 2021-10-14: 1000 mg via ORAL
  Filled 2021-10-14: qty 2

## 2021-10-14 NOTE — ED Triage Notes (Signed)
Knee pain

## 2021-10-14 NOTE — ED Provider Notes (Signed)
[facility] EMERGENCY DEPARTMENT Provider Note   CSN: [id number] Arrival date & time: 10/14/21  2308     History  No chief complaint on file.   Betty Henderson is a 61 y.o. female.  The history is provided by the patient.  Hip Pain This is a chronic problem. The current episode started more than 1 week ago. The problem occurs constantly. The problem has not changed since onset.Pertinent negatives include no chest pain, no abdominal pain, no headaches and no shortness of breath. Nothing aggravates the symptoms. Nothing relieves the symptoms. She has tried nothing for the symptoms. The treatment provided no relief.  Now with hip and knee pain.  Seen by me earlier today for same.  No new trauma.  Previous imaging reviewed.       Home Medications Prior to Admission medications   Medication Sig Start Date End Date Taking? Authorizing Provider  ranitidine (ZANTAC) 150 MG capsule Take 1 capsule (150 mg total) by mouth daily. Patient not taking: Reported on 03/20/2018 05/31/17 01/07/19  Lorre Nick, MD      Allergies    Other, Lemon oil, Proanthocyanidin, Strawberry extract, Adhesive  [tape], and Cherry    Review of Systems   Review of Systems  Constitutional:  Negative for fever.  HENT:  Negative for facial swelling.   Eyes:  Negative for redness.  Respiratory:  Negative for shortness of breath.   Cardiovascular:  Negative for chest pain.  Gastrointestinal:  Negative for abdominal pain.  Neurological:  Negative for headaches.  All other systems reviewed and are negative.   Physical Exam Updated Vital Signs There were no vitals taken for this visit. Physical Exam Vitals and nursing note reviewed. Exam conducted with a chaperone present.  Constitutional:      General: She is not in acute distress.    Appearance: Normal appearance. She is well-developed.  HENT:     Head: Normocephalic and atraumatic.     Nose: Nose normal.  Eyes:     Pupils: Pupils are  equal, round, and reactive to light.  Cardiovascular:     Rate and Rhythm: Normal rate and regular rhythm.     Pulses: Normal pulses.     Heart sounds: Normal heart sounds.  Pulmonary:     Effort: Pulmonary effort is normal. No respiratory distress.     Breath sounds: Normal breath sounds.  Abdominal:     General: Bowel sounds are normal. There is no distension.     Palpations: Abdomen is soft.     Tenderness: There is no abdominal tenderness. There is no guarding or rebound.  Genitourinary:    Vagina: No vaginal discharge.  Musculoskeletal:        General: No swelling, tenderness or deformity. Normal range of motion.     Cervical back: Neck supple.  Skin:    General: Skin is warm and dry.     Capillary Refill: Capillary refill takes less than 2 seconds.     Findings: No erythema or rash.  Neurological:     General: No focal deficit present.     Mental Status: She is alert.     Deep Tendon Reflexes: Reflexes normal.  Psychiatric:        Mood and Affect: Mood normal.     ED Results / Procedures / Treatments   Labs (all labs ordered are listed, but only abnormal results are displayed) Labs Reviewed - No data to display  EKG None  Radiology No results found.  Procedures  Procedures    Medications Ordered in ED Medications - No data to display  ED Course/ Medical Decision Making/ A&P                           Medical Decision Making See by me earlier today and is back with the same chronic issues   Amount and/or Complexity of Data Reviewed External Data Reviewed: notes.    Details: previous notes reviewed  Risk Risk Details: No new trauma.  Well appearing.  Patient wants a place to sleep.  Patient is stable for discharge with close follow up.      Final Clinical Impression(s) / ED Diagnoses Final diagnoses:  Other chronic pain   Return for intractable cough, coughing up blood, fevers > 100.4 unrelieved by medication, shortness of breath, intractable  vomiting, chest pain, shortness of breath, weakness, numbness, changes in speech, facial asymmetry, abdominal pain, passing out, Inability to tolerate liquids or food, cough, altered mental status or any concerns. No signs of systemic illness or infection. The patient is nontoxic-appearing on exam and vital signs are within normal limits.  I have reviewed the triage vital signs and the nursing notes. Pertinent labs & imaging results that were available during my care of the patient were reviewed by me and considered in my medical decision making (see chart for details). After history, exam, and medical workup I feel the patient has been appropriately medically screened and is safe for discharge home. Pertinent diagnoses were discussed with the patient. Patient was given return precautions.  Rx / DC Orders ED Discharge Orders     None         Dollie Mayse, MD 10/14/21 2324

## 2021-10-14 NOTE — ED Provider Notes (Signed)
[facility] EMERGENCY DEPARTMENT Provider Note   CSN: [id number] Arrival date & time: 10/14/21  0255     History  No chief complaint on file.   Betty Henderson is a 61 y.o. female.  The history is provided by the patient.  Hip Pain This is a chronic problem. The current episode started more than 1 week ago. The problem occurs constantly. The problem has not changed since onset.Pertinent negatives include no chest pain, no abdominal pain, no headaches and no shortness of breath. Nothing aggravates the symptoms. Nothing relieves the symptoms. She has tried nothing for the symptoms. The treatment provided no relief.       Home Medications Prior to Admission medications   Medication Sig Start Date End Date Taking? Authorizing Provider  ranitidine (ZANTAC) 150 MG capsule Take 1 capsule (150 mg total) by mouth daily. Patient not taking: Reported on 03/20/2018 05/31/17 01/07/19  Lorre Nick, MD      Allergies    Other, Lemon oil, Proanthocyanidin, Strawberry extract, Adhesive  [tape], and Cherry    Review of Systems   Review of Systems  Respiratory:  Negative for shortness of breath.   Cardiovascular:  Negative for chest pain.  Gastrointestinal:  Negative for abdominal pain.  Musculoskeletal:  Positive for arthralgias.  Neurological:  Negative for headaches.  All other systems reviewed and are negative.   Physical Exam Updated Vital Signs There were no vitals taken for this visit. Physical Exam Vitals and nursing note reviewed. Exam conducted with a chaperone present.  Constitutional:      General: She is not in acute distress.    Appearance: She is well-developed.  HENT:     Head: Normocephalic and atraumatic.     Nose: Nose normal.  Eyes:     Pupils: Pupils are equal, round, and reactive to light.  Cardiovascular:     Rate and Rhythm: Normal rate and regular rhythm.     Pulses: Normal pulses.     Heart sounds: Normal heart sounds.  Pulmonary:      Effort: Pulmonary effort is normal. No respiratory distress.     Breath sounds: Normal breath sounds.  Abdominal:     General: Bowel sounds are normal. There is no distension.     Palpations: Abdomen is soft.     Tenderness: There is no abdominal tenderness. There is no guarding or rebound.  Genitourinary:    Vagina: No vaginal discharge.  Musculoskeletal:        General: Normal range of motion.     Cervical back: Neck supple.     Right hip: Normal.     Left hip: Normal.     Right upper leg: Normal.     Left upper leg: Normal.     Right knee: Normal.     Left knee: Normal.     Right ankle: Normal.     Right Achilles Tendon: Normal.     Left ankle: Normal.     Left Achilles Tendon: Normal.  Skin:    General: Skin is dry.     Capillary Refill: Capillary refill takes less than 2 seconds.     Findings: No erythema or rash.  Neurological:     General: No focal deficit present.     Deep Tendon Reflexes: Reflexes normal.  Psychiatric:        Mood and Affect: Mood normal.     ED Results / Procedures / Treatments   Labs (all labs ordered are listed, but only abnormal results  are displayed) Labs Reviewed - No data to display  EKG None  Radiology No results found.  Procedures Procedures    Medications Ordered in ED Medications  acetaminophen (TYLENOL) tablet 1,000 mg (has no administration in time range)    ED Course/ Medical Decision Making/ A&P                           Medical Decision Making Chronic B hip pain imaged multiple times in the past.  Does not have a place to stay  Amount and/or Complexity of Data Reviewed Independent Historian: EMS    Details: see above External Data Reviewed: notes.    Details: previous notes reviewed  Risk OTC drugs. Risk Details: Patient with corneal abrasion since removing contact.  Discard contacts no contact lens wearing x 7 days.  Strict return precautions given.  Follow up with ophthalmology in 2 days for recheck.      Final Clinical Impression(s) / ED Diagnoses Final diagnoses:  Hip pain, unspecified laterality   Return for intractable cough, coughing up blood, fevers > 100.4 unrelieved by medication, shortness of breath, intractable vomiting, chest pain, shortness of breath, weakness, numbness, changes in speech, facial asymmetry, abdominal pain, passing out, Inability to tolerate liquids or food, cough, altered mental status or any concerns. No signs of systemic illness or infection. The patient is nontoxic-appearing on exam and vital signs are within normal limits.  I have reviewed the triage vital signs and the nursing notes. Pertinent labs & imaging results that were available during my care of the patient were reviewed by me and considered in my medical decision making (see chart for details). After history, exam, and medical workup I feel the patient has been appropriately medically screened and is safe for discharge home. Pertinent diagnoses were discussed with the patient. Patient was given return precautions.  Rx / DC Orders ED Discharge Orders     None         Fiora Weill, MD 10/14/21 [phone]

## 2021-10-14 NOTE — ED Triage Notes (Signed)
Evaluation. No hives noted. VS WNL Stable pelvis per EDP Palumbo No wheezing or stridor noted, trachea midline.

## 2022-01-16 DIAGNOSIS — R300 Dysuria: Secondary | ICD-10-CM | POA: Insufficient documentation

## 2022-01-16 DIAGNOSIS — Z5321 Procedure and treatment not carried out due to patient leaving prior to being seen by health care provider: Secondary | ICD-10-CM | POA: Insufficient documentation

## 2022-01-16 DIAGNOSIS — N898 Other specified noninflammatory disorders of vagina: Secondary | ICD-10-CM | POA: Insufficient documentation

## 2022-01-17 ENCOUNTER — Encounter (HOSPITAL_COMMUNITY): Payer: Self-pay | Admitting: Emergency Medicine

## 2022-01-17 ENCOUNTER — Emergency Department (HOSPITAL_COMMUNITY)
Admission: EM | Admit: 2022-01-17 | Discharge: 2022-01-18 | Discharge status: Against Medical Advice | Payer: Self-pay | Attending: Student | Admitting: Student

## 2022-01-17 ENCOUNTER — Other Ambulatory Visit: Payer: Self-pay

## 2022-01-17 ENCOUNTER — Emergency Department (HOSPITAL_COMMUNITY)
Admission: EM | Admit: 2022-01-17 | Discharge: 2022-01-17 | Discharge status: Against Medical Advice | Payer: Self-pay | Attending: Student | Admitting: Student

## 2022-01-17 DIAGNOSIS — Z5321 Procedure and treatment not carried out due to patient leaving prior to being seen by health care provider: Secondary | ICD-10-CM | POA: Insufficient documentation

## 2022-01-17 DIAGNOSIS — N898 Other specified noninflammatory disorders of vagina: Secondary | ICD-10-CM | POA: Insufficient documentation

## 2022-01-17 DIAGNOSIS — R300 Dysuria: Secondary | ICD-10-CM | POA: Insufficient documentation

## 2022-01-17 LAB — URINALYSIS, ROUTINE W REFLEX MICROSCOPIC
Bilirubin Urine: NEGATIVE
Glucose, UA: NEGATIVE mg/dL
Hgb urine dipstick: NEGATIVE
Ketones, ur: NEGATIVE mg/dL
Leukocytes,Ua: NEGATIVE
Nitrite: NEGATIVE
Protein, ur: NEGATIVE mg/dL
Specific Gravity, Urine: 1.01 (ref 1.005–1.030)
pH: 7 (ref 5.0–8.0)

## 2022-01-17 NOTE — ED Provider Triage Note (Signed)
Emergency Medicine Provider Triage Evaluation Note  Betty Henderson , a 61 y.o. female  was evaluated in triage.  Pt complains of boil on the left labia noticed a few days ago states is painful, but having vaginal discharge concern for possible yeast infection she also endorses dysuria no suprapubic pain no back tenderness no flank pain no other symptoms at this time..  Review of Systems  Positive: Dysuria, vaginal discharge Negative: Chest pain shortness of breath  Physical Exam  There were no vitals taken for this visit. Gen:   Awake, no distress   Resp:  Normal effort  MSK:   Moves extremities without difficulty  Other:    Medical Decision Making  Medically screening exam initiated at 12:09 AM.  Appropriate orders placed.  Betty Henderson was informed that the remainder of the evaluation will be completed by another provider, this initial triage assessment does not replace that evaluation, and the importance of remaining in the ED until their evaluation is complete.  Lab works been ordered will need further work-up.   Betty Fennel, PA-C 01/17/22 0009

## 2022-01-17 NOTE — ED Notes (Signed)
PATIENT LEFT AMA 

## 2022-01-17 NOTE — ED Triage Notes (Signed)
Pt c/o vaginal discharge and pain, states she feels a knot to the left side. Also c/o pain with urination.

## 2022-01-17 NOTE — ED Provider Triage Note (Signed)
Emergency Medicine Provider Triage Evaluation Note  Azayla Polo , a 61 y.o. female  was evaluated in triage.  Pt complains of vaginal itching and discharge over the last few days.   Denies any new sexual encounters, abdominal pain, nausea, vomiting.   Review of Systems  Per HPI  Physical Exam  BP 121/69 (BP Location: Left Arm)   Pulse 78   Temp 98.9 F (37.2 C) (Oral)   Resp 16   SpO2 100%  Gen:   Awake, no distress   Resp:  Normal effort  MSK:   Moves extremities without difficulty  Other:    Medical Decision Making  Medically screening exam initiated at 9:10 PM.  Appropriate orders placed.  Marilla Boddy was informed that the remainder of the evaluation will be completed by another provider, this initial triage assessment does not replace that evaluation, and the importance of remaining in the ED until their evaluation is complete.     Sherrill Raring, PA-C 01/17/22 2111

## 2022-01-17 NOTE — ED Triage Notes (Signed)
Pt c/o vaginal discharge and pain, states she feels a knot to the left side. Also c/o pain with urination. 

## 2022-01-18 NOTE — ED Notes (Signed)
Pt called multiple times for vitals with no response. °

## 2022-02-08 ENCOUNTER — Emergency Department (HOSPITAL_COMMUNITY): Payer: Self-pay

## 2022-02-08 ENCOUNTER — Other Ambulatory Visit: Payer: Self-pay

## 2022-02-08 ENCOUNTER — Encounter (HOSPITAL_COMMUNITY): Payer: Self-pay

## 2022-02-08 ENCOUNTER — Emergency Department (HOSPITAL_COMMUNITY)
Admission: EM | Admit: 2022-02-08 | Discharge: 2022-02-08 | Disposition: A | Discharge status: Home / Self Care | Payer: Self-pay | Attending: Emergency Medicine | Admitting: Emergency Medicine

## 2022-02-08 DIAGNOSIS — M25552 Pain in left hip: Secondary | ICD-10-CM | POA: Insufficient documentation

## 2022-02-08 DIAGNOSIS — Z5900 Homelessness unspecified: Secondary | ICD-10-CM | POA: Insufficient documentation

## 2022-02-08 MED ORDER — IBUPROFEN 800 MG PO TABS
800.0000 mg | ORAL_TABLET | Freq: Once | ORAL | Status: DC
  Filled 2022-02-08: qty 1

## 2022-02-08 NOTE — Discharge Instructions (Signed)
Your hip is not broken.  May use Tylenol and ibuprofen for any pain.  [facility] or any other pharmacy will not flu shots.  The health department is another option.  Feel better!
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
## 2022-02-08 NOTE — ED Triage Notes (Signed)
Patient states that she has been having bilateral hip pain for the last five years

## 2022-02-09 ENCOUNTER — Emergency Department (HOSPITAL_COMMUNITY)
Admission: EM | Admit: 2022-02-09 | Discharge: 2022-02-10 | Discharge status: Against Medical Advice | Payer: Self-pay | Attending: Emergency Medicine | Admitting: Emergency Medicine

## 2022-02-09 ENCOUNTER — Other Ambulatory Visit: Payer: Self-pay

## 2022-02-09 DIAGNOSIS — M79606 Pain in leg, unspecified: Secondary | ICD-10-CM | POA: Insufficient documentation

## 2022-02-09 DIAGNOSIS — Z5321 Procedure and treatment not carried out due to patient leaving prior to being seen by health care provider: Secondary | ICD-10-CM | POA: Insufficient documentation

## 2022-02-09 NOTE — ED Triage Notes (Signed)
Pt says that she is having pain with her arthritis because it is cold outside. She is requesting a flu shot and to be able to get some rest.

## 2022-02-09 NOTE — ED Provider Triage Note (Signed)
Emergency Medicine Provider Triage Evaluation Note  Mozell Haber , a 61 y.o. female  was evaluated in triage.  Pt complains of wanting a flu shot. Also says its cold and wants to stay and sleep overnight. She is complaining of arthritic pain diffusely. Seen yesterday for same.  Review of Systems  Positive: See above Negative:   Physical Exam  BP 120/83   Pulse 65   Temp 97.8 F (36.6 C)   Resp 18   SpO2 100%  Gen:   Awake, no distress   Resp:  Normal effort  MSK:   Moves extremities without difficulty  Other:    Medical Decision Making  Medically screening exam initiated at 9:24 PM.  Appropriate orders placed.  Toniette Devera was informed that the remainder of the evaluation will be completed by another provider, this initial triage assessment does not replace that evaluation, and the importance of remaining in the ED until their evaluation is complete.     Cristopher Peru, PA-C 02/09/22 2126

## 2022-02-10 ENCOUNTER — Emergency Department (HOSPITAL_COMMUNITY)
Admission: EM | Admit: 2022-02-10 | Discharge: 2022-02-10 | Disposition: A | Discharge status: Home / Self Care | Payer: Self-pay | Attending: Emergency Medicine | Admitting: Emergency Medicine

## 2022-02-10 ENCOUNTER — Emergency Department (HOSPITAL_COMMUNITY)
Admission: EM | Admit: 2022-02-10 | Discharge: 2022-02-10 | Discharge status: Against Medical Advice | Payer: Self-pay | Attending: Emergency Medicine | Admitting: Emergency Medicine

## 2022-02-10 ENCOUNTER — Other Ambulatory Visit: Payer: Self-pay

## 2022-02-10 DIAGNOSIS — M79605 Pain in left leg: Secondary | ICD-10-CM | POA: Insufficient documentation

## 2022-02-10 DIAGNOSIS — R52 Pain, unspecified: Secondary | ICD-10-CM

## 2022-02-10 DIAGNOSIS — M791 Myalgia, unspecified site: Secondary | ICD-10-CM | POA: Insufficient documentation

## 2022-02-10 DIAGNOSIS — M79606 Pain in leg, unspecified: Secondary | ICD-10-CM | POA: Insufficient documentation

## 2022-02-10 DIAGNOSIS — Z5321 Procedure and treatment not carried out due to patient leaving prior to being seen by health care provider: Secondary | ICD-10-CM | POA: Insufficient documentation

## 2022-02-10 DIAGNOSIS — Z765 Malingerer [conscious simulation]: Secondary | ICD-10-CM | POA: Insufficient documentation

## 2022-02-10 MED ORDER — IBUPROFEN 200 MG PO TABS
600.0000 mg | ORAL_TABLET | Freq: Once | ORAL | Status: AC
  Administered 2022-02-10: 600 mg via ORAL
  Filled 2022-02-10: qty 3

## 2022-02-10 NOTE — ED Provider Notes (Signed)
[facility]-EMERGENCY DEPT Provider Note   CSN: [id number] Arrival date & time: 02/10/22  2049     History  Chief Complaint  Patient presents with  . Generalized Body Aches    Betty Henderson is a 61 y.o. female. With past medical history of schizoaffective disorder, malingering, homelessness who presents to the ED today with leg pain.   She is well known to the ED and was seen already today, yesterday, 11/17 and 11/15. She states she does not know why she is here but states she is tired and that her legs hurt. No new falls. She denies any fevers,  swelling of her joints. She states her legs have been hurting "for a long time."  HPI     Home Medications Prior to Admission medications   Medication Sig Start Date End Date Taking? Authorizing Provider  ranitidine (ZANTAC) 150 MG capsule Take 1 capsule (150 mg total) by mouth daily. Patient not taking: Reported on 03/20/2018 05/31/17 01/07/19  Lorre Nick, MD      Allergies    Other, Lemon oil, Proanthocyanidin, Strawberry extract, Adhesive  [tape], and Cherry    Review of Systems   Review of Systems  Musculoskeletal:  Positive for arthralgias and myalgias.    Physical Exam Updated Vital Signs BP 123/73   Pulse 68   Temp 98.9 F (37.2 C) (Oral)   Resp 18   SpO2 99%  Physical Exam Vitals and nursing note reviewed.  Constitutional:      General: She is not in acute distress.    Appearance: She is not ill-appearing.  HENT:     Head: Normocephalic and atraumatic.  Eyes:     General: No scleral icterus. Cardiovascular:     Pulses: Normal pulses.  Pulmonary:     Effort: Pulmonary effort is normal. No respiratory distress.  Musculoskeletal:        General: Normal range of motion.     Comments: Mild TTP of the left hip   Skin:    General: Skin is warm and dry.     Findings: No rash.  Neurological:     General: No focal deficit present.     Mental Status: She is alert.  Psychiatric:         Mood and Affect: Mood normal.        Behavior: Behavior normal.        Thought Content: Thought content normal.        Judgment: Judgment normal.    ED Results / Procedures / Treatments   Labs (all labs ordered are listed, but only abnormal results are displayed) Labs Reviewed - No data to display  EKG None  Radiology DG Hip Unilat W or Wo Pelvis 2-3 Views Left  Result Date: 02/08/2022 CLINICAL DATA:  Pain EXAM: DG HIP (WITH OR WITHOUT PELVIS) 2-3V LEFT COMPARISON:  Left hip x-ray 08/17/2021 FINDINGS: There is no acute fracture or dislocation. There are severe degenerative changes of both hips with joint space narrowing, osteophyte formation, sclerosis and subchondral cystic change. Findings are similar to the prior study. Soft tissues are within normal limits. IMPRESSION: 1. No acute fracture or dislocation. 2. 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 Electronically Signed   By: Darliss Cheney M.D.   On: 02/08/2022 22:13    Procedures Procedures   Medications Ordered in ED Medications  ibuprofen (ADVIL) tablet 600 mg (has no administration in time range)    ED Course/ Medical Decision Making/ A&P  Medical Decision Making This patient presents to the ED with chief complaint(s) of leg pain, tired with pertinent past medical history of malingering, homelessness, schizoaffective which further complicates the presenting complaint. The complaint involves an extensive differential diagnosis and also carries with it a high risk of complications and morbidity.    The differential diagnosis includes arthritis, chronic pain syndrome,    Additional history obtained: Additional history obtained from  none available Records reviewed Care Everywhere/External Records and Primary Care Documents  ED Course and Reassessment: 61 year old female who presents for bilateral, non traumatic leg pain and generally feeling tired.   Her physical exam is normal.  She was seen on 11/17 for similar symptoms and had xray which showed degenerative changes of the hips without acute findings. No new trauma. Will no repeat imaging. Given ibuprofen.   Will discharge with shelter resources. Given return precautions.   Independent labs interpretation:  The following labs were independently interpreted: not indicated   Independent visualization of imaging: Viewed XR of hips from 11/17 with no acute findings  Consultation: - Consulted or discussed management/test interpretation w/ external professional: not indicated   Consideration for admission or further workup: not indicated  Social Determinants of health: homelessness - given resources  Final Clinical Impression(s) / ED Diagnoses Final diagnoses:  None    Rx / DC Orders ED Discharge Orders     None

## 2022-02-10 NOTE — ED Provider Triage Note (Addendum)
Emergency Medicine Provider Triage Evaluation Note  Betty Henderson , a 61 y.o. female  was evaluated in triage.  Pt complains of left leg pain.  Patient notes that she would like her flu shot.  Also notes that she has had pain to the left leg that is her arthritis.  Notes that she has been walking more.  Notes that the pain is worse with cold air.  Patient notes that her pain feels similar to when she was evaluated in the ED 2 days ago for similar concerns.  Review of Systems  Positive:  Negative:   Physical Exam  BP 116/70   Pulse 65   Temp 98.6 F (37 C)   Resp 19   SpO2 100%  Gen:   Awake, no distress   Resp:  Normal effort  MSK:   Moves extremities without difficulty  Other:  Normal flexion against resistance of left leg.  Medical Decision Making  Medically screening exam initiated at 5:24 PM.  Appropriate orders placed.  Betty Henderson was informed that the remainder of the evaluation will be completed by another provider, this initial triage assessment does not replace that evaluation, and the importance of remaining in the ED until their evaluation is complete.  Work-up initiated.    Betty Henderson A, PA-C 02/10/22 1730   5:32 PM - notified by RN that patient wanted to leave the ED after being informed that we do not do flu shots in the ED.    Betty Henderson A, PA-C 02/10/22 1736

## 2022-02-10 NOTE — ED Triage Notes (Signed)
Pt arrives ambulatory to room via [facility]. Pt c/o all over body pain. vss

## 2022-02-10 NOTE — ED Triage Notes (Signed)
Pt states that she would like a flu shot.  But since she cannot have a flu shot she is leaving.

## 2022-02-10 NOTE — Discharge Instructions (Addendum)
You were seen for leg pain. We gave you some ibuprofen. I have also given you resources to the local shelters. Please return for emergencies like stroke symptoms, chest pain, shortness of breath, severe abdominal pain, broken bones, and others. Please follow-up with your primary care as soon as you are able.

## 2022-02-11 ENCOUNTER — Encounter (HOSPITAL_COMMUNITY): Payer: Self-pay

## 2022-02-11 ENCOUNTER — Other Ambulatory Visit: Payer: Self-pay

## 2022-02-11 ENCOUNTER — Emergency Department (HOSPITAL_COMMUNITY)
Admission: EM | Admit: 2022-02-11 | Discharge: 2022-02-11 | Disposition: A | Discharge status: Home / Self Care | Payer: Self-pay | Attending: Emergency Medicine | Admitting: Emergency Medicine

## 2022-02-11 DIAGNOSIS — M25552 Pain in left hip: Secondary | ICD-10-CM | POA: Insufficient documentation

## 2022-02-11 DIAGNOSIS — M79604 Pain in right leg: Secondary | ICD-10-CM | POA: Insufficient documentation

## 2022-02-11 DIAGNOSIS — G8929 Other chronic pain: Secondary | ICD-10-CM | POA: Insufficient documentation

## 2022-02-11 DIAGNOSIS — Z5900 Homelessness unspecified: Secondary | ICD-10-CM | POA: Insufficient documentation

## 2022-02-11 DIAGNOSIS — M79605 Pain in left leg: Secondary | ICD-10-CM | POA: Insufficient documentation

## 2022-02-11 MED ORDER — IBUPROFEN 800 MG PO TABS
800.0000 mg | ORAL_TABLET | Freq: Once | ORAL | Status: AC
  Administered 2022-02-11: 800 mg via ORAL
  Filled 2022-02-11: qty 1

## 2022-02-11 MED ORDER — ACETAMINOPHEN 500 MG PO TABS
1000.0000 mg | ORAL_TABLET | Freq: Once | ORAL | Status: AC
  Administered 2022-02-11: 1000 mg via ORAL
  Filled 2022-02-11: qty 2

## 2022-02-11 NOTE — ED Notes (Signed)
Pt refused d/c vitals.

## 2022-02-11 NOTE — Discharge Instructions (Addendum)
You were seen today for chronic leg pain.  Please follow-up as needed with your primary care provider.  I have attached resources listing local homeless shelters.

## 2022-02-11 NOTE — ED Triage Notes (Signed)
[place] C/o bilateral leg pain from walking all day.

## 2022-02-11 NOTE — ED Provider Notes (Signed)
[facility] [place] [facility]-EMERGENCY DEPT Provider Note   CSN: [id number] Arrival date & time: 02/11/22  2033     History  Chief Complaint  Patient presents with   Pain    Betty Henderson is a 61 y.o. female.  Patient with past medical history of schizoaffective disorder, malingering, homelessness who presents to the ED today with leg pain.    She is well known to the ED and was seen already 11/19, 11/18, 11/17 and 11/15.  She states she is tired and that both of her legs hurt.  She is that she is walked a lot today.  She denies any new falls.  States that she has no fevers, no joint swelling.  She endorses bilateral leg pain for a "long time".  HPI     Home Medications Prior to Admission medications   Medication Sig Start Date End Date Taking? Authorizing Provider  ranitidine (ZANTAC) 150 MG capsule Take 1 capsule (150 mg total) by mouth daily. Patient not taking: Reported on 03/20/2018 05/31/17 01/07/19  Lorre Nick, MD      Allergies    Other, Lemon oil, Proanthocyanidin, Strawberry extract, Adhesive  [tape], and Cherry    Review of Systems   Review of Systems  Musculoskeletal:  Positive for arthralgias and myalgias.    Physical Exam Updated Vital Signs BP (!) 136/90   Pulse 84   Temp 98.5 F (36.9 C)   Resp 18   SpO2 99%  Physical Exam HENT:     Head: Normocephalic and atraumatic.     Mouth/Throat:     Mouth: Mucous membranes are moist.  Eyes:     Conjunctiva/sclera: Conjunctivae normal.  Cardiovascular:     Rate and Rhythm: Normal rate.  Pulmonary:     Effort: Pulmonary effort is normal. No respiratory distress.  Abdominal:     General: Abdomen is flat.  Musculoskeletal:        General: Tenderness (Mild TTP lateral left hip) present. No swelling or signs of injury. Normal range of motion.     Cervical back: Normal range of motion.  Skin:    General: Skin is dry.     Capillary Refill: Capillary refill takes less than 2 seconds.   Neurological:     General: No focal deficit present.     Mental Status: She is alert.  Psychiatric:        Speech: Speech normal.        Behavior: Behavior normal.     ED Results / Procedures / Treatments   Labs (all labs ordered are listed, but only abnormal results are displayed) Labs Reviewed - No data to display  EKG None  Radiology No results found.  Procedures Procedures    Medications Ordered in ED Medications  ibuprofen (ADVIL) tablet 800 mg (800 mg Oral Given 02/11/22 2147)  acetaminophen (TYLENOL) tablet 1,000 mg (1,000 mg Oral Given 02/11/22 2148)    ED Course/ Medical Decision Making/ A&P                           Medical Decision Making Risk OTC drugs. Prescription drug management.   Patient presents emergency department chief complaint of bilateral leg pain.  Differential diagnosis includes but not limited arthritis, chronic pain syndrome  I reviewed the patient's emergency department notes from the past week with visits for similar complaints  There is no indication at this time for new imaging.  The patient has had multiple images in  the past of her left hip with the most recent imaging being done on November 17.  Images showed stable severe degenerative changes of bilateral hips  Patient was ordered ibuprofen for inflammation and Tylenol for pain.  Upon reassessment the patient was feeling somewhat better.  The patient is homeless and has been given resources for local shelters.  At this time I see no indication for further emergent work-up.  There was no new trauma.  Patient to be discharged home with resources for local shelters.  Return precautions provided.       Final Clinical Impression(s) / ED Diagnoses Final diagnoses:  Other chronic pain  Bilateral leg pain    Rx / DC Orders ED Discharge Orders     None         Pamala Duffel 02/11/22 2220    Gwyneth Sprout, MD 02/12/22 0006

## 2022-02-11 NOTE — ED Notes (Signed)
Pt ambulatory without assistance.  

## 2022-02-12 ENCOUNTER — Other Ambulatory Visit: Payer: Self-pay

## 2022-02-12 ENCOUNTER — Encounter (HOSPITAL_COMMUNITY): Payer: Self-pay

## 2022-02-12 ENCOUNTER — Emergency Department (HOSPITAL_COMMUNITY)
Admission: EM | Admit: 2022-02-12 | Discharge: 2022-02-12 | Disposition: A | Discharge status: Home / Self Care | Payer: Self-pay | Attending: Emergency Medicine | Admitting: Emergency Medicine

## 2022-02-12 ENCOUNTER — Ambulatory Visit (HOSPITAL_COMMUNITY)
Admission: RE | Admit: 2022-02-12 | Discharge: 2022-02-12 | Disposition: A | Discharge status: Home / Self Care | Payer: Self-pay | Attending: Psychiatry | Admitting: Psychiatry

## 2022-02-12 ENCOUNTER — Emergency Department (HOSPITAL_COMMUNITY)
Admission: EM | Admit: 2022-02-12 | Discharge: 2022-02-12 | Discharge status: Against Medical Advice | Payer: Self-pay | Attending: Emergency Medicine | Admitting: Emergency Medicine

## 2022-02-12 DIAGNOSIS — M79672 Pain in left foot: Secondary | ICD-10-CM | POA: Insufficient documentation

## 2022-02-12 DIAGNOSIS — X58XXXA Exposure to other specified factors, initial encounter: Secondary | ICD-10-CM | POA: Insufficient documentation

## 2022-02-12 DIAGNOSIS — X31XXXA Exposure to excessive natural cold, initial encounter: Secondary | ICD-10-CM | POA: Insufficient documentation

## 2022-02-12 DIAGNOSIS — T699XXA Effect of reduced temperature, unspecified, initial encounter: Secondary | ICD-10-CM

## 2022-02-12 DIAGNOSIS — T3399XA Superficial frostbite of other sites, initial encounter: Secondary | ICD-10-CM | POA: Insufficient documentation

## 2022-02-12 DIAGNOSIS — Z5900 Homelessness unspecified: Secondary | ICD-10-CM | POA: Insufficient documentation

## 2022-02-12 DIAGNOSIS — Z5321 Procedure and treatment not carried out due to patient leaving prior to being seen by health care provider: Secondary | ICD-10-CM | POA: Insufficient documentation

## 2022-02-12 DIAGNOSIS — G8929 Other chronic pain: Secondary | ICD-10-CM

## 2022-02-12 DIAGNOSIS — M79671 Pain in right foot: Secondary | ICD-10-CM | POA: Insufficient documentation

## 2022-02-12 MED ORDER — ACETAMINOPHEN 325 MG PO TABS
650.0000 mg | ORAL_TABLET | Freq: Once | ORAL | Status: AC
  Administered 2022-02-12: 650 mg via ORAL
  Filled 2022-02-12: qty 2
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
## 2022-02-12 NOTE — H&P (Signed)
Behavioral Health Medical Screening Exam  Betty Henderson is a 61 y.o. female.  With a history of schizoaffective disorder, malingering, homelessness presented to [facility] voluntarily.  Patient was just discharged from [facility] a few hours ago according to patient she just came across the street because she wants somewhere to stay and she needs somebody to look at her [employer] bite legs.  Patient was seen in the ED and cleared medically.  On the intake form patient did put detox when asked how frequent does she drink patient stated I do not drink.  At this time patient is presented to malingering.  Patient did state that she lives in [place], Clinical research associate asked patient why do not she go home to [employer] patient stated she does not have a ride.   Please see ED notes from [facility]: Betty Henderson is a 61 y.o. female with history of schizoaffective disorder, chronic pain, malingering who presents the emergency department complaining of bilateral foot pain and "frostbite" for the past year.  Patient most recently seen this morning for similar chronic pain.  Discharged in stable condition.   She is requesting some "cream" to help with her "frostbite".    Face-to-face observation of patient, patient is alert and oriented, speech is clear but a little bit muffled.  Patient denies SI, HI, AVH or paranoia.  Patient denies alcohol use.  Patient denies illicit drug use.  Patient reports she smokes cigarettes daily.    Recommend discharge for patient to follow-up with outpatient psychiatry.   Total Time spent with patient: 20 minutes  Psychiatric Specialty Exam:  Presentation  General Appearance: No data recorded Eye Contact:No data recorded Speech:No data recorded Speech Volume:No data recorded Handedness:No data recorded  Mood and Affect  Mood:No data recorded Affect:No data recorded  Thought Process  Thought Processes:No data recorded Descriptions of Associations:No data  recorded Orientation:No data recorded Thought Content:No data recorded History of Schizophrenia/Schizoaffective disorder:No data recorded Duration of Psychotic Symptoms:No data recorded Hallucinations:No data recorded Ideas of Reference:No data recorded Suicidal Thoughts:No data recorded Homicidal Thoughts:No data recorded  Sensorium  Memory:No data recorded Judgment:No data recorded Insight:No data recorded  Executive Functions  Concentration:No data recorded Attention Span:No data recorded Recall:No data recorded Fund of Knowledge:No data recorded Language:No data recorded  Psychomotor Activity  Psychomotor Activity:No data recorded  Assets  Assets:No data recorded  Sleep  Sleep:No data recorded   Physical Exam: Physical Exam HENT:     Head: Normocephalic.  Cardiovascular:     Rate and Rhythm: Normal rate.  Pulmonary:     Effort: Pulmonary effort is normal.  Musculoskeletal:        General: Normal range of motion.     Cervical back: Normal range of motion.  Neurological:     General: No focal deficit present.     Mental Status: She is alert.  Psychiatric:        Behavior: Behavior normal.    Review of Systems  Constitutional: Negative.   HENT: Negative.    Eyes: Negative.   Respiratory: Negative.    Cardiovascular: Negative.   Gastrointestinal: Negative.   Genitourinary: Negative.   Musculoskeletal: Negative.   Skin: Negative.   Neurological: Negative.   Endo/Heme/Allergies: Negative.   Psychiatric/Behavioral:  The patient is nervous/anxious.    There were no vitals taken for this visit. There is no height or weight on file to calculate BMI.  Musculoskeletal: Strength & Muscle Tone: within normal limits Gait & Station: normal Patient leans: N/A  [place] Scale:  Flowsheet Row [facility] Most recent reading at 02/12/2022 10:22 PM ED from 02/12/2022 in [facility] [place]  HOSPITAL-EMERGENCY DEPT Most recent reading at 02/12/2022  4:55 AM ED from 02/11/2022 in [facility] [place] [facility]-EMERGENCY DEPT Most recent reading at 02/11/2022  8:42 PM  C-SSRS RISK CATEGORY No Risk No Risk No Risk       Recommendations:  Based on my evaluation the patient appears to have an emergency medical condition for which I recommend the patient be transferred to the emergency department for further evaluation.  Sindy Guadeloupe, NP 02/12/2022, 10:22 PM
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
## 2022-02-12 NOTE — ED Notes (Signed)
Pt called from lobby multiple times, pt now returns for triage

## 2022-02-12 NOTE — ED Notes (Signed)
I went to recheck patient vitals and she starting cursing and refused.  Patient "stated she was leaving."

## 2022-02-12 NOTE — ED Triage Notes (Signed)
Pt returns and complains of frostbite on lower extremities for about 1 year.
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
## 2022-03-28 ENCOUNTER — Encounter (HOSPITAL_BASED_OUTPATIENT_CLINIC_OR_DEPARTMENT_OTHER): Payer: Self-pay | Admitting: Emergency Medicine

## 2022-03-28 ENCOUNTER — Emergency Department (HOSPITAL_BASED_OUTPATIENT_CLINIC_OR_DEPARTMENT_OTHER)
Admission: EM | Admit: 2022-03-28 | Discharge: 2022-03-28 | Disposition: A | Discharge status: Home / Self Care | Payer: Self-pay | Attending: Emergency Medicine | Admitting: Emergency Medicine

## 2022-03-28 ENCOUNTER — Emergency Department (HOSPITAL_BASED_OUTPATIENT_CLINIC_OR_DEPARTMENT_OTHER)
Admission: EM | Admit: 2022-03-28 | Discharge: 2022-03-28 | Discharge status: Against Medical Advice | Payer: Self-pay | Attending: Emergency Medicine | Admitting: Emergency Medicine

## 2022-03-28 ENCOUNTER — Other Ambulatory Visit: Payer: Self-pay

## 2022-03-28 DIAGNOSIS — M79604 Pain in right leg: Secondary | ICD-10-CM | POA: Insufficient documentation

## 2022-03-28 DIAGNOSIS — Z5321 Procedure and treatment not carried out due to patient leaving prior to being seen by health care provider: Secondary | ICD-10-CM | POA: Insufficient documentation

## 2022-03-28 DIAGNOSIS — M79605 Pain in left leg: Secondary | ICD-10-CM | POA: Insufficient documentation

## 2022-03-28 DIAGNOSIS — M79661 Pain in right lower leg: Secondary | ICD-10-CM | POA: Insufficient documentation

## 2022-03-28 DIAGNOSIS — M79662 Pain in left lower leg: Secondary | ICD-10-CM | POA: Insufficient documentation

## 2022-03-28 MED ORDER — NAPROXEN 250 MG PO TABS
500.0000 mg | ORAL_TABLET | Freq: Once | ORAL | Status: AC
  Administered 2022-03-28: 500 mg via ORAL
  Filled 2022-03-28: qty 2

## 2022-03-28 MED ORDER — ACETAMINOPHEN 325 MG PO TABS
650.0000 mg | ORAL_TABLET | Freq: Once | ORAL | Status: AC
  Administered 2022-03-28: 650 mg via ORAL
  Filled 2022-03-28: qty 2

## 2022-03-28 MED ORDER — NAPROXEN 500 MG PO TABS: 500.0000 mg | ORAL_TABLET | Freq: Two times a day (BID) | ORAL | 0 refills | Status: DC
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
## 2022-03-28 NOTE — ED Triage Notes (Signed)
Pt states she wants something else for leg pain and a cab voucher.

## 2022-03-28 NOTE — Discharge Instructions (Signed)
In addition to making naproxen, please take acetaminophen as needed to get additional pain relief.  When you combine acetaminophen with naproxen, you get better pain relief and you get from taking other medication by itself.

## 2022-03-28 NOTE — ED Triage Notes (Signed)
Pt is c/o arthritis pain  States she is hurting in her joints from the waist down  Pt states she always hurts when it is winter time

## 2022-04-24 ENCOUNTER — Encounter (HOSPITAL_BASED_OUTPATIENT_CLINIC_OR_DEPARTMENT_OTHER): Payer: Self-pay | Admitting: Urology

## 2022-04-24 ENCOUNTER — Emergency Department (HOSPITAL_BASED_OUTPATIENT_CLINIC_OR_DEPARTMENT_OTHER)
Admission: EM | Admit: 2022-04-24 | Discharge: 2022-04-25 | Disposition: A | Discharge status: Home / Self Care | Payer: Self-pay | Attending: Emergency Medicine | Admitting: Emergency Medicine

## 2022-04-24 DIAGNOSIS — G8929 Other chronic pain: Secondary | ICD-10-CM | POA: Insufficient documentation

## 2022-04-24 DIAGNOSIS — M25552 Pain in left hip: Secondary | ICD-10-CM | POA: Insufficient documentation

## 2022-04-24 DIAGNOSIS — T730XXD Starvation, subsequent encounter: Secondary | ICD-10-CM

## 2022-04-24 DIAGNOSIS — M25551 Pain in right hip: Secondary | ICD-10-CM | POA: Insufficient documentation

## 2022-04-24 DIAGNOSIS — T730XXA Starvation, initial encounter: Secondary | ICD-10-CM | POA: Insufficient documentation

## 2022-04-24 DIAGNOSIS — Z765 Malingerer [conscious simulation]: Secondary | ICD-10-CM | POA: Insufficient documentation

## 2022-04-24 MED ORDER — ACETAMINOPHEN 325 MG PO TABS
650.0000 mg | ORAL_TABLET | Freq: Once | ORAL | Status: AC
  Administered 2022-04-24: 650 mg via ORAL
  Filled 2022-04-24: qty 2

## 2022-04-24 NOTE — ED Triage Notes (Signed)
Pt arrived via EMS for chronic left hip pain, reports being homeless and is cold.  Given warm blanket

## 2022-04-24 NOTE — ED Provider Notes (Signed)
[place] [facility] Provider Note   CSN: [id number] Arrival date & time: 04/24/22  2227     History  Chief Complaint  Patient presents with   Hip Pain    Betty Henderson is a 62 y.o. female who is very well-known to this department with numerous ER visits presents complaining of chronic hip pain and housing assistance.  Patient states that she is homeless and needed someplace warm to be.  No other new complaints.   Hip Pain       Home Medications Prior to Admission medications   Medication Sig Start Date End Date Taking? Authorizing Provider  naproxen (NAPROSYN) 500 MG tablet Take 1 tablet (500 mg total) by mouth 2 (two) times daily. 04/29/07   Delora Fuel, MD  ranitidine (ZANTAC) 150 MG capsule Take 1 capsule (150 mg total) by mouth daily. Patient not taking: Reported on 03/20/2018 05/31/17 01/07/19  Lacretia Leigh, MD      Allergies    Other, Lemon oil, Proanthocyanidin, Strawberry extract, Adhesive  [tape], and Cherry    Review of Systems   Review of Systems  Musculoskeletal:  Positive for arthralgias.  All other systems reviewed and are negative.   Physical Exam Updated Vital Signs BP (!) 157/98 (BP Location: Right Arm)   Pulse 71   Temp 98 F (36.7 C) (Oral)   Resp 18   Ht 5\' 6"  (1.676 m)   Wt 53.1 kg   SpO2 100%   BMI 18.89 kg/m  Physical Exam Vitals and nursing note reviewed.  Constitutional:      General: She is sleeping.     Appearance: Normal appearance.  HENT:     Head: Normocephalic and atraumatic.  Eyes:     Conjunctiva/sclera: Conjunctivae normal.  Pulmonary:     Effort: Pulmonary effort is normal. No respiratory distress.  Skin:    General: Skin is warm and dry.  Neurological:     Mental Status: She is easily aroused.  Psychiatric:        Mood and Affect: Mood normal.        Behavior: Behavior normal.     ED Results / Procedures / Treatments   Labs (all labs ordered are listed, but only  abnormal results are displayed) Labs Reviewed - No data to display  EKG None  Radiology No results found.  Procedures Procedures    Medications Ordered in ED Medications  acetaminophen (TYLENOL) tablet 650 mg (has no administration in time range)    ED Course/ Medical Decision Making/ A&P                             Medical Decision Making Risk OTC drugs.   Patient is a 62 year old female with history of schizoaffective disorder, psychosis, chronic pain, and malingering with numerous ER visits both this department and nearby facility presenting complaining of chronic pain and requiring housing assistance.  No new complaints.  On exam, complaining of hip pain, no other complaints.  Sleeping comfortably in the bed.  Asking for coffee and something to eat.  Given Tylenol for chronic pain, given food in a blanket.  Not requiring any further evaluation at this time.  Discharged in stable condition. Given resources for shelters.   Final Clinical Impression(s) / ED Diagnoses Final diagnoses:  Chronic pain of both hips  Malingering  Hungry, subsequent encounter    Rx / DC Orders ED Discharge Orders  None      Portions of this report may have been transcribed using voice recognition software. Every effort was made to ensure accuracy; however, inadvertent computerized transcription errors may be present.    Estill Cotta 04/24/22 2332    Truddie Hidden, MD 04/25/22 Benancio Deeds

## 2022-04-25 ENCOUNTER — Other Ambulatory Visit: Payer: Self-pay

## 2022-04-25 ENCOUNTER — Emergency Department (HOSPITAL_BASED_OUTPATIENT_CLINIC_OR_DEPARTMENT_OTHER)
Admission: EM | Admit: 2022-04-25 | Discharge: 2022-04-26 | Disposition: A | Discharge status: Home / Self Care | Payer: Self-pay | Attending: Emergency Medicine | Admitting: Emergency Medicine

## 2022-04-25 DIAGNOSIS — Z765 Malingerer [conscious simulation]: Secondary | ICD-10-CM | POA: Insufficient documentation

## 2022-04-25 DIAGNOSIS — M25559 Pain in unspecified hip: Secondary | ICD-10-CM | POA: Insufficient documentation

## 2022-04-25 NOTE — ED Notes (Signed)
Pt provided with snacks and coffee.
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
## 2022-04-26 NOTE — ED Provider Notes (Signed)
   [place] [facility]  Provider Note  CSN: [id number] Arrival date & time: 04/25/22 2140  History Chief Complaint  Patient presents with   Hip Pain    Betty Henderson is a 62 y.o. female with frequent ED visits for chronic hip pain here via EMS for same. Was seen in this ED last night and allowed to sleep in the ED all night, but was verbally abusive and belligerent at discharge at 7am. On my initially evaluation when I asked what we can help her with tonight, she responded 'you tell me'. She then became verbally abuse again and walked out of the ED.    Home Medications Prior to Admission medications   Medication Sig Start Date End Date Taking? Authorizing Provider  naproxen (NAPROSYN) 500 MG tablet Take 1 tablet (500 mg total) by mouth 2 (two) times daily. 05/03/45   Delora Fuel, MD  ranitidine (ZANTAC) 150 MG capsule Take 1 capsule (150 mg total) by mouth daily. Patient not taking: Reported on 03/20/2018 05/31/17 01/07/19  Lacretia Leigh, MD     Allergies    Other, Lemon oil, Proanthocyanidin, Strawberry extract, Adhesive  [tape], and Cherry   Review of Systems   Review of Systems Please see HPI for pertinent positives and negatives  Physical Exam BP 112/61 (BP Location: Right Arm)   Pulse 68   Temp 98.3 F (36.8 C)   Resp 18   SpO2 100%   Physical Exam Vitals and nursing note reviewed.  HENT:     Head: Normocephalic.     Nose: Nose normal.  Eyes:     Extraocular Movements: Extraocular movements intact.  Pulmonary:     Effort: Pulmonary effort is normal.  Musculoskeletal:        General: Normal range of motion.     Cervical back: Neck supple.  Skin:    Findings: No rash (on exposed skin).  Neurological:     Mental Status: She is alert and oriented to person, place, and time.     Gait: Gait normal.  Psychiatric:        Mood and Affect: Mood normal.     ED Results / Procedures / Treatments    EKG None  Procedures Procedures  Medications Ordered in the ED Medications - No data to display  Initial Impression and Plan  Patient malingering, no emergent medical condition identified on limited evaluation as patient became angry and walked out of the ED with a steady gait.   ED Course       MDM Rules/Calculators/A&P Medical Decision Making Problems Addressed: Malingering: chronic illness or injury     Final Clinical Impression(s) / ED Diagnoses Final diagnoses:  Malingering    Rx / DC Orders ED Discharge Orders     None        Truddie Hidden, MD 04/26/22 [phone]

## 2022-05-01 ENCOUNTER — Emergency Department (HOSPITAL_COMMUNITY)
Admission: EM | Admit: 2022-05-01 | Discharge: 2022-05-02 | Discharge status: Against Medical Advice | Payer: Self-pay | Attending: Emergency Medicine | Admitting: Emergency Medicine

## 2022-05-01 DIAGNOSIS — R52 Pain, unspecified: Secondary | ICD-10-CM | POA: Insufficient documentation

## 2022-05-01 DIAGNOSIS — Z5329 Procedure and treatment not carried out because of patient's decision for other reasons: Secondary | ICD-10-CM | POA: Insufficient documentation

## 2022-05-02 ENCOUNTER — Encounter (HOSPITAL_COMMUNITY): Payer: Self-pay | Admitting: *Deleted

## 2022-05-02 ENCOUNTER — Other Ambulatory Visit: Payer: Self-pay

## 2022-05-02 ENCOUNTER — Ambulatory Visit (HOSPITAL_BASED_OUTPATIENT_CLINIC_OR_DEPARTMENT_OTHER): Payer: Self-pay | Admitting: Critical Care Medicine

## 2022-05-02 ENCOUNTER — Ambulatory Visit: Payer: Self-pay | Admitting: Family Medicine

## 2022-05-02 DIAGNOSIS — Z91199 Patient's noncompliance with other medical treatment and regimen due to unspecified reason: Secondary | ICD-10-CM

## 2022-05-02 NOTE — ED Provider Triage Note (Signed)
  Emergency Medicine Provider Triage Evaluation Note  MRN:  [id number]  Arrival date & time: 05/02/22    Medically screening exam initiated at 12:49 AM.   CC:   Body aches  HPI:  Betty Henderson is a 62 y.o. year-old female presents to the ED with chief complaint of generalized body aches.  Unable to say when symptoms started.  Hx of malingering.  History provided by patient. ROS:  -As included in HPI PE:  There were no vitals filed for this visit.  Non-toxic appearing No respiratory distress  MDM:  Based on signs and symptoms, Covid/flu is highest on my differential. I've ordered labs in triage to expedite lab/diagnostic workup.  Patient was informed that the remainder of the evaluation will be completed by another provider, this initial triage assessment does not replace that evaluation, and the importance of remaining in the ED until their evaluation is complete.    Montine Circle, PA-C 05/02/22 [phone]

## 2022-05-02 NOTE — ED Notes (Signed)
Pt ama refused care per rob pa

## 2022-05-02 NOTE — ED Provider Notes (Signed)
  [facility] Emergency Department Provider Note MRN:  [id number]  [place] date & time: 05/02/22     Chief Complaint   Headache   History of Present Illness   Betty Henderson is a 62 y.o. year-old female presents to the ED with chief complaint of body aches.  Hx of malingering.  Multiple recent visits.  History provided by patient.   Review of Systems  Pertinent positive and negative review of systems noted in HPI.    Physical Exam  There were no vitals filed for this visit.  CONSTITUTIONAL:  non toxic-appearing, NAD NEURO:  Alert and oriented x 3, CN 3-12 grossly intact EYES:  eyes equal and reactive ENT/NECK:  Supple, no stridor  CARDIO:  appears well-perfused  PULM:  No respiratory distress,  GI/GU:  non-distended,  MSK/SPINE:  No gross deformities, no edema, moves all extremities  SKIN:  no rash, atraumatic   *Additional and/or pertinent findings included in MDM below  Diagnostic and Interventional Summary    EKG Interpretation  Date/Time:    Ventricular Rate:    PR Interval:    QRS Duration:   QT Interval:    QTC Calculation:   R Axis:     Text Interpretation:         Labs Reviewed  RESP PANEL BY RT-PCR (RSV, FLU A&B, COVID)  [id number]    No orders to display    Medications - No data to display   Procedures  /  Critical Care Procedures  ED Course and Medical Decision Making  I have reviewed the triage vital signs, the nursing notes, and pertinent available records from the EMR.  Social Determinants Affecting Complexity of Care: Patient has no clinically significant social determinants affecting this chief complaint..   ED Course:    Medical Decision Making    Consultants: No consultations were needed in caring for this patient.   Treatment and Plan:  Patients that they would like to leave.  I have discussed my concerns with the patient about them leaving without completing the evaluation.   Patient has  capacity to make own medical decisions.  Patient presents with body aches  Symptoms include: body aches  Concern for: covid or flu  Study limitations and other tests offered include: refused testing  Treatment and recommended follow-up include: PCP follow-up  I do not feel that the patient should leave prior to completing their workup. I have discussed the above symptoms, initial findings, study limitations, and treatment plan with the patient. Patient is not altered, does not have any distracting issues, and has capacity to make decisions for themselves. Patient places themselves at risk of worsening symptoms, disability, morbidity and/or death.     Final Clinical Impressions(s) / ED Diagnoses     ICD-10-CM   1. Body aches  R52       ED Discharge Orders     None         Discharge Instructions Discussed with and Provided to Patient:   Discharge Instructions   None      Montine Circle, PA-C 09/38/18 2993    Delora Fuel, MD 71/69/67 [phone]

## 2022-05-02 NOTE — ED Triage Notes (Signed)
The pt is c/o a headache for 2 months  her speech is very hard to understand

## 2022-05-02 NOTE — ED Notes (Signed)
The pt refuses the covid swab

## 2022-05-02 NOTE — Progress Notes (Signed)
Pt was no show

## 2022-05-07 ENCOUNTER — Encounter (HOSPITAL_COMMUNITY): Payer: Self-pay

## 2022-05-07 ENCOUNTER — Emergency Department (HOSPITAL_COMMUNITY)
Admission: EM | Admit: 2022-05-07 | Discharge: 2022-05-07 | Disposition: A | Discharge status: Home / Self Care | Payer: Self-pay | Attending: Emergency Medicine | Admitting: Emergency Medicine

## 2022-05-07 DIAGNOSIS — M791 Myalgia, unspecified site: Secondary | ICD-10-CM | POA: Insufficient documentation

## 2022-05-07 MED ORDER — ACETAMINOPHEN 325 MG PO TABS: 650.0000 mg | ORAL_TABLET | Freq: Once | ORAL | Status: DC

## 2022-05-07 NOTE — ED Provider Notes (Signed)
[facility] Provider Note   CSN: [id number] Arrival date & time: 05/07/22  1844     History  No chief complaint on file.   Betty Henderson is a 62 y.o. female.  62 year old homeless female presents today for generalized bodyaches.  Patient was brought in via EMS.  According to EMS patient found [place].  Patient walked up to [facility] and asked the staff to call EMS for her.  Patient noted to have multiple recent ED visits including for malingering.  Has not taken anything prior to arrival.   The history is provided by the patient. No language interpreter was used.       Home Medications Prior to Admission medications   Medication Sig Start Date End Date Taking? Authorizing Provider  naproxen (NAPROSYN) 500 MG tablet Take 1 tablet (500 mg total) by mouth 2 (two) times daily. [id number]   Delora Fuel, MD  ranitidine (ZANTAC) 150 MG capsule Take 1 capsule (150 mg total) by mouth daily. Patient not taking: Reported on 03/20/2018 05/31/17 01/07/19  Lacretia Leigh, MD      Allergies    Other, Lemon oil, Proanthocyanidin, Strawberry extract, Adhesive  [tape], and Cherry    Review of Systems   Review of Systems  Constitutional:  Negative for chills and fever.  Respiratory:  Negative for shortness of breath.   Cardiovascular:  Negative for chest pain.  Gastrointestinal:  Negative for abdominal pain.  Genitourinary:  Negative for dysuria.  Musculoskeletal:  Positive for myalgias. Negative for arthralgias, back pain, neck pain and neck stiffness.  Neurological:  Negative for light-headedness.    Physical Exam Updated Vital Signs BP 128/75 (BP Location: Right Arm)   Pulse 68   Temp (!) 97.5 F (36.4 C) (Oral)   Resp 17   SpO2 100%  Physical Exam Vitals and nursing note reviewed.  Constitutional:      General: She is not in acute distress.    Appearance: Normal appearance. She is not ill-appearing.  HENT:     Head:  Normocephalic and atraumatic.     Nose: Nose normal.  Eyes:     Conjunctiva/sclera: Conjunctivae normal.  Cardiovascular:     Rate and Rhythm: Normal rate and regular rhythm.  Pulmonary:     Effort: Pulmonary effort is normal. No respiratory distress.     Breath sounds: Normal breath sounds. No wheezing.  Abdominal:     General: There is no distension.     Palpations: Abdomen is soft.     Tenderness: There is no abdominal tenderness. There is no guarding.  Musculoskeletal:        General: No deformity. Normal range of motion.     Cervical back: Normal range of motion.     Right lower leg: No edema.     Left lower leg: No edema.  Skin:    Findings: No rash.  Neurological:     Mental Status: She is alert.     ED Results / Procedures / Treatments   Labs (all labs ordered are listed, but only abnormal results are displayed) Labs Reviewed - No data to display  EKG None  Radiology No results found.  Procedures Procedures    Medications Ordered in ED Medications - No data to display  ED Course/ Medical Decision Making/ A&P  Medical Decision Making  62 year old female with history of homelessness presents today for evaluation of generalized bodyaches.  She states this has been ongoing since yesterday.  She does not engage in interview all that much.  Constantly pulls the covers over her head and wants to sleep.  Bilateral lower extremities with good range of motion.  Abdomen soft.  Lung sounds are clear.  Overall she is well-appearing without acute distress.  She is more concerned about food, and sleep.  Afebrile.  Vital signs otherwise reassuring. No acute distress that warrants immediate ED eval.  Denies chest pain, shortness of breath.  No prior history of DVT.  Patient is appropriate for discharge.  Discharged in stable condition.  Return precautions discussed.  Final Clinical Impression(s) / ED Diagnoses Final diagnoses:  Myalgia    Rx /  DC Orders ED Discharge Orders     None         Evlyn Courier, PA-C 05/07/22 2234    Charlesetta Shanks, MD 05/08/22 [phone]

## 2022-05-07 NOTE — ED Provider Triage Note (Signed)
Emergency Medicine Provider Triage Evaluation Note  Betty Henderson , a 62 y.o. female  was evaluated in triage.  Pt complains of leg pain.  Patient reports she is currently homeless and has had an increase in leg pain due to the recent colder rainy temperatures and weather.  Patient reports that she has been in triage and in the ambulance for period of time and that her leg pain is improved.  Patient denies any chest pain, shortness of breath, fevers, loss of sensation or motor function in the legs.  Patient denies any prior history of blood clots.  Review of Systems  Positive: As above Negative: As above  Physical Exam  BP 128/75 (BP Location: Right Arm)   Pulse 68   Temp (!) 97.5 F (36.4 C) (Oral)   Resp 17   SpO2 100%  Gen:   Awake, no distress   Resp:  Normal effort  MSK:   Moves extremities without difficulty.  No tenderness on palpation in bilateral legs Other:    Medical Decision Making  Medically screening exam initiated at 7:07 PM.  Appropriate orders placed.  Betty Henderson was informed that the remainder of the evaluation will be completed by another provider, this initial triage assessment does not replace that evaluation, and the importance of remaining in the ED until their evaluation is complete.     Luvenia Heller, PA-C 05/07/22 1908

## 2022-05-07 NOTE — Discharge Instructions (Signed)
Your exam today was overall reassuring.  You received a dose of Tylenol in the emergency department.  Take Tylenol and ibuprofen as you need to for your body aches.  If you have any worsening symptoms please return to the emergency room otherwise follow-up with your primary care provider.

## 2022-05-07 NOTE — ED Notes (Signed)
Pt walked out before discharge was completed and discharge meds given or D/C vitals taken

## 2022-05-07 NOTE — ED Triage Notes (Signed)
[facility] bib ems [facility]; pt homeless; ems reports pt walked to [facility], asked staff to call [phone], and sat down; after sitting down pt c/o increased pain to knees, lower legs, and abdomen; denies n/v; knee pain chronic; hx osteoarthritis, states now worse; pt also c/o neck pain, began after being stung by bee 2 months ago; pt ambulatory to stretcher w/o assistance; VSS; 138/94, p 96, rr 18, 98% RA

## 2022-05-08 ENCOUNTER — Other Ambulatory Visit: Payer: Self-pay

## 2022-05-08 ENCOUNTER — Emergency Department (HOSPITAL_COMMUNITY)
Admission: EM | Admit: 2022-05-08 | Discharge: 2022-05-08 | Disposition: A | Discharge status: Home / Self Care | Payer: Self-pay | Attending: Emergency Medicine | Admitting: Emergency Medicine

## 2022-05-08 ENCOUNTER — Encounter (HOSPITAL_COMMUNITY): Payer: Self-pay | Admitting: Emergency Medicine

## 2022-05-08 ENCOUNTER — Emergency Department (HOSPITAL_COMMUNITY)
Admission: EM | Admit: 2022-05-08 | Discharge: 2022-05-09 | Disposition: A | Discharge status: Home / Self Care | Payer: Self-pay | Attending: Emergency Medicine | Admitting: Emergency Medicine

## 2022-05-08 DIAGNOSIS — R5383 Other fatigue: Secondary | ICD-10-CM | POA: Insufficient documentation

## 2022-05-08 DIAGNOSIS — M542 Cervicalgia: Secondary | ICD-10-CM | POA: Insufficient documentation

## 2022-05-08 DIAGNOSIS — M7918 Myalgia, other site: Secondary | ICD-10-CM

## 2022-05-08 DIAGNOSIS — M791 Myalgia, unspecified site: Secondary | ICD-10-CM | POA: Insufficient documentation

## 2022-05-08 DIAGNOSIS — Z765 Malingerer [conscious simulation]: Secondary | ICD-10-CM | POA: Insufficient documentation

## 2022-05-08 NOTE — ED Triage Notes (Signed)
Pt states she is aching all over as she has been walking all day. Pt cool to touch and states she has been outside all night.

## 2022-05-08 NOTE — ED Provider Notes (Signed)
[facility] Provider Note   CSN: [id number] Arrival date & time: 05/08/22  0216     History  Chief Complaint  Patient presents with   Generalized Body Aches    Rella Misek is a 62 y.o. female.  HPI     This is a 62 year old female who presents with "pain all over."  When I attempted to interview the patient she continued to throw the covers over her head.  She stated multiple times "G*& D*(m leave me alone."  When asked if she had any complaints she states "I told you I hurt all over."  She will not further elaborate.  She will not answer specific questions.  On multiple attempts to redirect the patient, she is uncooperative and yelling and cussing at me.  She was just seen and evaluated earlier this shift for similar complaints.  She has had innumerable ED visits did in her chart.  She has a history of malingering behaviors.  Home Medications Prior to Admission medications   Medication Sig Start Date End Date Taking? Authorizing Provider  naproxen (NAPROSYN) 500 MG tablet Take 1 tablet (500 mg total) by mouth 2 (two) times daily. [id number]   Delora Fuel, MD  ranitidine (ZANTAC) 150 MG capsule Take 1 capsule (150 mg total) by mouth daily. Patient not taking: Reported on 03/20/2018 05/31/17 01/07/19  Lacretia Leigh, MD      Allergies    Other, Lemon oil, Proanthocyanidin, Strawberry extract, Adhesive  [tape], and Cherry    Review of Systems   Review of Systems  Unable to perform ROS: Other (uncooperative)    Physical Exam Updated Vital Signs BP 129/71 (BP Location: Right Arm)   Pulse 70   Temp (!) 97.3 F (36.3 C) (Oral)   Resp 17   Ht 1.676 m (5' 6"$ )   Wt 53 kg   SpO2 97%   BMI 18.86 kg/m  Physical Exam Vitals and nursing note reviewed.  Constitutional:      Appearance: She is well-developed. She is not ill-appearing.  HENT:     Head: Normocephalic and atraumatic.     Mouth/Throat:     Comments: Multiple missing  teeth Eyes:     Pupils: Pupils are equal, round, and reactive to light.  Cardiovascular:     Rate and Rhythm: Normal rate and regular rhythm.  Pulmonary:     Effort: Pulmonary effort is normal. No respiratory distress.  Abdominal:     Palpations: Abdomen is soft.  Musculoskeletal:     Cervical back: Neck supple.  Skin:    General: Skin is warm and dry.  Neurological:     Mental Status: She is alert and oriented to person, place, and time.  Psychiatric:     Comments: Agitated, uncooperative     ED Results / Procedures / Treatments   Labs (all labs ordered are listed, but only abnormal results are displayed) Labs Reviewed - No data to display  EKG None  Radiology No results found.  Procedures Procedures    Medications Ordered in ED Medications - No data to display  ED Course/ Medical Decision Making/ A&P                             Medical Decision Making  This patient presents to the ED for concern of fatigue, myalgia, this involves an extensive number of treatment options, and is a complaint that carries with it a  high risk of complications and morbidity.  I considered the following differential and admission for this acute, potentially life threatening condition.  The differential diagnosis includes viral illness, general fatigue, malingering  MDM:    This is a 62 year old female who presents with complaints of pain all over.  She will not further elaborate.  She will not allow me to examine her.  She constantly pulls the covers over her head.  She was seen and evaluated at the beginning of the shift with similar behaviors.  Highly suspect malingering.  Her vital signs are reassuring.  She does not appear to have acute emergent process.  Given limited history and physical exam because of patient not being cooperative, do not feel I can serve her today.  I have low suspicion for acute emergent process.  (Labs, imaging, consults)  Labs: I Ordered, and personally  interpreted labs.  The pertinent results include: None  Imaging Studies ordered: I ordered imaging studies including none I independently visualized and interpreted imaging. I agree with the radiologist interpretation  Additional history obtained from chart review.  External records from outside source obtained and reviewed including multiple prior ED visits  Cardiac Monitoring: The patient was not maintained on a cardiac monitor.  If on the cardiac monitor, I personally viewed and interpreted the cardiac monitored which showed an underlying rhythm of: N/A  Reevaluation: After the interventions noted above, I reevaluated the patient and found that they have :stayed the same  Social Determinants of Health: housing instability  Disposition:  d/c  Co morbidities that complicate the patient evaluation  Past Medical History:  Diagnosis Date   Anemia    Arthritis    Chronic pain    Drug-seeking behavior    Malingering    Osteopetrosis    Psychosis ([place])    Schizoaffective disorder, bipolar type ([place])      Medicines No orders of the defined types were placed in this encounter.   I have reviewed the patients home medicines and have made adjustments as needed  Problem List / ED Course: Problem List Items Addressed This Visit   None Visit Diagnoses     Myalgia    -  Primary   Tired                       Final Clinical Impression(s) / ED Diagnoses Final diagnoses:  Myalgia  Tired    Rx / DC Orders ED Discharge Orders     None         Malajah Oceguera, Barbette Hair, MD 05/08/22 0246

## 2022-05-08 NOTE — ED Provider Triage Note (Signed)
Emergency Medicine Provider Triage Evaluation Note  Betty Henderson , a 62 y.o. female  was evaluated in triage.  Pt complains of pain all over.  Specifically having paraspinal pain at the base of her neck for the last few days, denies any trauma.  No vision changes, lateralized weakness or numbness..  Review of Systems  Per HPI  Physical Exam  BP 116/65 (BP Location: Right Arm)   Pulse 74   Temp 97.9 F (36.6 C)   Resp 17   Ht 5' 6"$  (1.676 m)   Wt 59 kg   SpO2 100%   BMI 20.98 kg/m  Gen:   Awake, no distress   Resp:  Normal effort  MSK:   Moves extremities without difficulty  Other:  No midline tenderness to cervical spine, no nuchal rigidity.  Follows commands, cranial nerves II through XII are grossly intact  Medical Decision Making  Medically screening exam initiated at 9:25 PM.  Appropriate orders placed.  Betty Henderson was informed that the remainder of the evaluation will be completed by another provider, this initial triage assessment does not replace that evaluation, and the importance of remaining in the ED until their evaluation is complete.  He has been seen for this complaint multiple times, no imaging has been ordered but it was atraumatic, there is no midline tenderness and I do not think a CT is indicated based on my evaluation.   Sherrill Raring, PA-C 05/08/22 2126

## 2022-05-08 NOTE — Discharge Instructions (Signed)
You are seen today for body aches and tiredness.  Given that you would not participate in exam or history, it is difficult to know whether you have any acute problems.  However your vital signs are reassuring.  Make sure that you are getting plenty of rest.

## 2022-05-08 NOTE — ED Triage Notes (Addendum)
Pt c/o neck pain for a couple days. Denies injury. Denies weakness/numbness. States she was seen for the same yesterday but her pain is persisting.

## 2022-05-09 MED ORDER — ACETAMINOPHEN 325 MG PO TABS
650.0000 mg | ORAL_TABLET | Freq: Once | ORAL | Status: AC
  Administered 2022-05-09: 650 mg via ORAL
  Filled 2022-05-09: qty 2

## 2022-05-09 NOTE — ED Provider Notes (Signed)
[facility] Provider Note   CSN: [id number] Arrival date & time: 05/08/22  2114     History  Chief Complaint  Patient presents with   Neck Pain    Betty Henderson is a 62 y.o. female who presents on her 30th visit since the beginning of the year with concern for "pain all over".  Patient very poorly compliant with interview with this provider, underneath of the blankets, becoming agitated when I request that she remove them so that we may speak.  Patient unwilling to expound upon her symptoms at this time, demanding that I give her Tylenol and that I leave her alone, states that she wants somewhere to sleep.  Patient seen several times since the beginning of the year, history of malingering behaviors, uncooperative with this provider.  HPI     Home Medications Prior to Admission medications   Medication Sig Start Date End Date Taking? Authorizing Provider  naproxen (NAPROSYN) 500 MG tablet Take 1 tablet (500 mg total) by mouth 2 (two) times daily. [id number]   Delora Fuel, MD  ranitidine (ZANTAC) 150 MG capsule Take 1 capsule (150 mg total) by mouth daily. Patient not taking: Reported on 03/20/2018 05/31/17 01/07/19  Lacretia Leigh, MD      Allergies    Other, Lemon oil, Proanthocyanidin, Strawberry extract, Adhesive  [tape], and Cherry    Review of Systems   Review of Systems  Unable to perform ROS: Other (Patient refusing interview)    Physical Exam Updated Vital Signs BP 122/66   Pulse 72   Temp 97.9 F (36.6 C)   Resp 15   Ht 5' 6"$  (1.676 m)   Wt 59 kg   SpO2 100%   BMI 20.98 kg/m  Physical Exam Vitals and nursing note reviewed.  Constitutional:      Appearance: She is not ill-appearing or toxic-appearing.  HENT:     Head: Normocephalic and atraumatic.  Eyes:     General: No scleral icterus.       Right eye: No discharge.        Left eye: No discharge.     Conjunctiva/sclera: Conjunctivae normal.     Pupils:  Pupils are equal, round, and reactive to light.  Cardiovascular:     Rate and Rhythm: Normal rate and regular rhythm.     Pulses: Normal pulses.     Heart sounds: Normal heart sounds. No murmur heard. Pulmonary:     Effort: Pulmonary effort is normal.  Abdominal:     Palpations: Abdomen is soft.  Musculoskeletal:        General: No swelling or deformity.     Right lower leg: No edema.  Skin:    General: Skin is warm and dry.     Capillary Refill: Capillary refill takes less than 2 seconds.  Neurological:     General: No focal deficit present.     Mental Status: She is alert.  Psychiatric:        Mood and Affect: Mood normal.     ED Results / Procedures / Treatments   Labs (all labs ordered are listed, but only abnormal results are displayed) Labs Reviewed - No data to display  EKG None  Radiology No results found.  Procedures Procedures    Medications Ordered in ED Medications  acetaminophen (TYLENOL) tablet 650 mg (650 mg Oral Given 05/09/22 0147)    ED Course/ Medical Decision Making/ A&P  Medical Decision Making 62 year old female who presents with concern for "pain all over".   VS normal on intake, limited exam due to patient's poor participation. Cardiopulmonary exam is normal. No focal deficit neurologically.  Risk OTC drugs.   Patient poorly compliant with interview and exam, however does not appear to have any emergent or acute medical concern at this time.  Feel visit is primarily for secondary gain of warm place to stay and something to eat.  Patient offered Tylenol for her reported body aches.  No further workup warranted near this time.  Clinical concern for emergent underlying etiology that would warrant further ED workup or inpatient management is exceedingly low.  Betty Henderson voiced understanding of her medical evaluation and treatment plan. Each of their questions answered to their expressed satisfaction.  Return  precautions were given.  Patient is well-appearing, stable, and was discharged in good condition.  This chart was dictated using voice recognition software, Dragon. Despite the best efforts of this provider to proofread and correct errors, errors may still occur which can change documentation meaning.   Final Clinical Impression(s) / ED Diagnoses Final diagnoses:  Musculoskeletal pain  Malingering    Rx / DC Orders ED Discharge Orders     None         Aura Dials 05/09/22 0511    Fatima Blank, MD 05/09/22 1905

## 2022-05-13 ENCOUNTER — Encounter (HOSPITAL_BASED_OUTPATIENT_CLINIC_OR_DEPARTMENT_OTHER): Payer: Self-pay | Admitting: Emergency Medicine

## 2022-05-13 ENCOUNTER — Emergency Department (HOSPITAL_BASED_OUTPATIENT_CLINIC_OR_DEPARTMENT_OTHER)
Admission: EM | Admit: 2022-05-13 | Discharge: 2022-05-13 | Disposition: A | Discharge status: Home / Self Care | Payer: Self-pay | Attending: Emergency Medicine | Admitting: Emergency Medicine

## 2022-05-13 ENCOUNTER — Other Ambulatory Visit: Payer: Self-pay

## 2022-05-13 DIAGNOSIS — M79604 Pain in right leg: Secondary | ICD-10-CM | POA: Insufficient documentation

## 2022-05-13 DIAGNOSIS — M79605 Pain in left leg: Secondary | ICD-10-CM | POA: Insufficient documentation

## 2022-05-13 MED ORDER — ACETAMINOPHEN 500 MG PO TABS
1000.0000 mg | ORAL_TABLET | Freq: Once | ORAL | Status: AC
  Administered 2022-05-13: 1000 mg via ORAL
  Filled 2022-05-13: qty 2

## 2022-05-13 NOTE — Discharge Instructions (Signed)
You have been seen today for your complaint of leg pain on both sides. Your discharge medications include Alternate tylenol and ibuprofen for pain. You may alternate these every 4 hours. You may take up to 800 mg of ibuprofen at a time and up to 1000 mg of tylenol. Follow up with: Your primary care provider regarding your symptoms Please seek immediate medical care if you develop any of the following symptoms: You have a new injury and your pain is worse or different. You feel numb or you have tingling in the painful area. At this time there does not appear to be the presence of an emergent medical condition, however there is always the potential for conditions to change. Please read and follow the below instructions.  Do not take your medicine if  develop an itchy rash, swelling in your mouth or lips, or difficulty breathing; call 911 and seek immediate emergency medical attention if this occurs.  You may review your lab tests and imaging results in their entirety on your MyChart account.  Please discuss all results of fully with your primary care provider and other specialist at your follow-up visit.  Note: Portions of this text may have been transcribed using voice recognition software. Every effort was made to ensure accuracy; however, inadvertent computerized transcription errors may still be present.

## 2022-05-13 NOTE — ED Provider Notes (Signed)
[facility] [place] [facility] Provider Note   CSN: [id number] Arrival date & time: 05/13/22  2003     History  Chief Complaint  Patient presents with   Leg Pain    Betty Henderson is a 62 y.o. female.  With history of schizoaffective disorder, psychosis, chronic leg pain, arthritis who presents to the ED for evaluation of bilateral lower extremity pain.  She states that she has had this for "many years."  States that cold weather makes her pain worse.  She has not taken anything for pain prior to arrival.  She states that she "just wants a place to warm up and dry out."  She denies recent trauma or falls.  Rates the pain at a 4 out of 10 and describes it as a mild ache in her knees.  Denies numbness, weakness or tingling.  Also complaining of scalp pruritus.  She states she has not showered in a few days.   Leg Pain      Home Medications Prior to Admission medications   Medication Sig Start Date End Date Taking? Authorizing Provider  naproxen (NAPROSYN) 500 MG tablet Take 1 tablet (500 mg total) by mouth 2 (two) times daily. [id number]   Delora Fuel, MD  ranitidine (ZANTAC) 150 MG capsule Take 1 capsule (150 mg total) by mouth daily. Patient not taking: Reported on 03/20/2018 05/31/17 01/07/19  Lacretia Leigh, MD      Allergies    Other, Lemon oil, Proanthocyanidin, Strawberry extract, Adhesive  [tape], and Cherry    Review of Systems   Review of Systems  Musculoskeletal:  Positive for arthralgias.  All other systems reviewed and are negative.   Physical Exam Updated Vital Signs BP 116/72 (BP Location: Right Arm)   Pulse 71   Temp 98 F (36.7 C)   Resp 18   Ht 5' 6"$  (1.676 m)   Wt 56 kg   SpO2 97%   BMI 19.93 kg/m  Physical Exam Vitals and nursing note reviewed.  Constitutional:      General: She is not in acute distress.    Appearance: Normal appearance. She is normal weight. She is not ill-appearing.  HENT:     Head: Normocephalic  and atraumatic.  Cardiovascular:     Pulses:          Dorsalis pedis pulses are 2+ on the right side and 2+ on the left side.  Pulmonary:     Effort: Pulmonary effort is normal. No respiratory distress.  Abdominal:     General: Abdomen is flat.  Musculoskeletal:        General: Tenderness (Mild tenderness on palpation of bilateral knees) present. No swelling, deformity or signs of injury. Normal range of motion.     Cervical back: Neck supple.     Right lower leg: No edema.     Left lower leg: No edema.  Skin:    General: Skin is warm and dry.     Capillary Refill: Capillary refill takes less than 2 seconds.  Neurological:     General: No focal deficit present.     Mental Status: She is alert and oriented to person, place, and time.  Psychiatric:        Mood and Affect: Mood normal.        Behavior: Behavior normal.     ED Results / Procedures / Treatments   Labs (all labs ordered are listed, but only abnormal results are displayed) Labs Reviewed - No data  to display  EKG None  Radiology No results found.  Procedures Procedures    Medications Ordered in ED Medications  acetaminophen (TYLENOL) tablet 1,000 mg (has no administration in time range)    ED Course/ Medical Decision Making/ A&P                             Medical Decision Making This patient presents to the ED for concern of bilateral knee pain, this involves an extensive number of treatment options, and is a complaint that carries with it a high risk of complications and morbidity.  The differential diagnosis includes fracture, strain, sprain, contusion  Co morbidities that complicate the patient evaluation  schizoaffective disorder, psychosis, chronic leg pain, arthritis  Additional history obtained from: Nursing notes from this visit.  Afebrile, hemodynamically stable.  63 year old female presenting to the ED for evaluation of bilateral knee pain that she describes as mild.  Has not taken  anything for this.  She is seen very often for similar symptoms on chart review with 11 ED visits in the past 10 days.  Physical exam is remarkable for mild bilateral knee tenderness to palpation with no obvious deformities or swelling.  No suspicion for acute abnormalities.  Patient admits to "just wanting a place to warm up and dry out."  She was given Tylenol in the ED and encouraged to follow-up with her primary care provider regarding her persistent musculoskeletal pain symptoms.  Patient also has a component of malingering.  She is given return precautions.  Stable at discharge.  At this time there does not appear to be any evidence of an acute emergency medical condition and the patient appears stable for discharge with appropriate outpatient follow up. Diagnosis was discussed with patient who verbalizes understanding of care plan and is agreeable to discharge. I have discussed return precautions with patient who verbalizes understanding. Patient encouraged to follow-up with their PCP within 1 week. All questions answered.  Note: Portions of this report may have been transcribed using voice recognition software. Every effort was made to ensure accuracy; however, inadvertent computerized transcription errors may still be present.        Final Clinical Impression(s) / ED Diagnoses Final diagnoses:  Bilateral leg pain    Rx / DC Orders ED Discharge Orders     None         Nehemiah Massed 05/13/22 2336    Regan Lemming, MD 05/14/22 [phone]

## 2022-05-13 NOTE — ED Triage Notes (Signed)
Patient c/o chronic bilateral leg pain for several days. Also reports she has some sort of virus and has been itching.

## 2022-05-14 ENCOUNTER — Other Ambulatory Visit: Payer: Self-pay

## 2022-05-14 ENCOUNTER — Encounter (HOSPITAL_BASED_OUTPATIENT_CLINIC_OR_DEPARTMENT_OTHER): Payer: Self-pay

## 2022-05-14 ENCOUNTER — Emergency Department (HOSPITAL_BASED_OUTPATIENT_CLINIC_OR_DEPARTMENT_OTHER)
Admission: EM | Admit: 2022-05-14 | Discharge: 2022-05-15 | Disposition: A | Discharge status: Home / Self Care | Payer: Self-pay | Attending: Emergency Medicine | Admitting: Emergency Medicine

## 2022-05-14 DIAGNOSIS — Z59819 Housing instability, housed unspecified: Secondary | ICD-10-CM

## 2022-05-14 DIAGNOSIS — M545 Low back pain, unspecified: Secondary | ICD-10-CM | POA: Insufficient documentation

## 2022-05-14 DIAGNOSIS — M791 Myalgia, unspecified site: Secondary | ICD-10-CM | POA: Insufficient documentation

## 2022-05-14 DIAGNOSIS — J00 Acute nasopharyngitis [common cold]: Secondary | ICD-10-CM | POA: Insufficient documentation

## 2022-05-14 DIAGNOSIS — Z59811 Housing instability, housed, with risk of homelessness: Secondary | ICD-10-CM | POA: Insufficient documentation

## 2022-05-14 NOTE — ED Triage Notes (Signed)
Pt reports lower back pain "since it got cold". Pt yelling in triage when asked what symptoms she is having. Pt not wanting to provide any other information.

## 2022-05-15 ENCOUNTER — Other Ambulatory Visit: Payer: Self-pay

## 2022-05-15 DIAGNOSIS — Z5321 Procedure and treatment not carried out due to patient leaving prior to being seen by health care provider: Secondary | ICD-10-CM | POA: Insufficient documentation

## 2022-05-15 DIAGNOSIS — M791 Myalgia, unspecified site: Secondary | ICD-10-CM | POA: Insufficient documentation

## 2022-05-15 NOTE — ED Notes (Signed)
Crackers and coffee given to pt

## 2022-05-15 NOTE — ED Provider Notes (Signed)
[facility] [place] [facility] Provider Note   CSN: [id number] Arrival date & time: 05/14/22  2335     History  Chief Complaint  Patient presents with   Back Pain    Betty Henderson is a 62 y.o. female.  The history is provided by the patient and medical records.  Back Pain Betty Henderson is a 62 y.o. female who presents to the Emergency Department complaining of feeling cold.  Patient told triage nurse that she developed low back pain since it got cold.  On my assessment patient states that she feels cold all over and is unable to elicit how long this has been going on.  No fevers.  She complains of body pain from her arthritis.  No new injuries.  No chest pain, abdominal pain.  No SI, HI.     Home Medications Prior to Admission medications   Medication Sig Start Date End Date Taking? Authorizing Provider  naproxen (NAPROSYN) 500 MG tablet Take 1 tablet (500 mg total) by mouth 2 (two) times daily. [id number]   Delora Fuel, MD  ranitidine (ZANTAC) 150 MG capsule Take 1 capsule (150 mg total) by mouth daily. Patient not taking: Reported on 03/20/2018 05/31/17 01/07/19  Lacretia Leigh, MD      Allergies    Other, Lemon oil, Proanthocyanidin, Strawberry extract, Adhesive  [tape], and Cherry    Review of Systems   Review of Systems  Musculoskeletal:  Positive for back pain.  All other systems reviewed and are negative.   Physical Exam Updated Vital Signs BP 119/74 (BP Location: Left Arm)   Pulse 72   Temp 97.8 F (36.6 C)   Resp 18   Ht 5' 6"$  (1.676 m)   Wt 55 kg   SpO2 100%   BMI 19.57 kg/m  Physical Exam Vitals and nursing note reviewed.  Constitutional:      Appearance: She is well-developed.  HENT:     Head: Normocephalic and atraumatic.  Cardiovascular:     Rate and Rhythm: Normal rate and regular rhythm.     Heart sounds: No murmur heard. Pulmonary:     Effort: Pulmonary effort is normal. No respiratory distress.     Breath  sounds: Normal breath sounds.  Abdominal:     Palpations: Abdomen is soft.     Tenderness: There is no abdominal tenderness. There is no guarding or rebound.  Musculoskeletal:        General: No swelling or tenderness.     Comments: 2+ DP pulses  Skin:    General: Skin is warm and dry.  Neurological:     Mental Status: She is alert and oriented to person, place, and time.     Comments: 5 out of 5 strength in all 4 extremities  Psychiatric:     Comments: Disorganized and tangential.  No SI, HI.  Does not appear to be responding to internal stimuli.     ED Results / Procedures / Treatments   Labs (all labs ordered are listed, but only abnormal results are displayed) Labs Reviewed - No data to display  EKG None  Radiology No results found.  Procedures Procedures    Medications Ordered in ED Medications - No data to display  ED Course/ Medical Decision Making/ A&P                             Medical Decision Making  Patient with history of  schizoaffective disorder here for which she describes as being cold.  She is normothermic on evaluation.  She has good perfusion on evaluation and moves all extremities symmetrically and strongly.  She does not desire full examination as she wants to sleep under the covers and not interact.  She is well-known for multiple emergency department visits for similar and varied complaints.  Patient was allowed to rest in the emergency department, drink a cup of coffee and take a nap.  On repeat assessment she states that she is warm and feeling better and would like discharge.  Plan to discharge with resources for housing and social services.  No evidence of acute decompensated psychiatric illness at this point.       Final Clinical Impression(s) / ED Diagnoses Final diagnoses:  Housing instability    Rx / DC Orders ED Discharge Orders     None         Quintella Reichert, MD 05/15/22 0600

## 2022-05-15 NOTE — ED Notes (Signed)
Pt given coffee and crackers. Pt is now fast asleep with notable rise and fall of chest.

## 2022-05-15 NOTE — ED Notes (Signed)
Pt asked for paper scrubs prior to leaving; however, pt refused vitals and stated she is leaving. Pt ambulatory with even and steady gait.

## 2022-05-15 NOTE — ED Triage Notes (Signed)
Pt reports she has not been feeling well since it got cold outside. Generalized body aches.

## 2022-05-16 ENCOUNTER — Emergency Department (HOSPITAL_BASED_OUTPATIENT_CLINIC_OR_DEPARTMENT_OTHER)
Admission: EM | Admit: 2022-05-16 | Discharge: 2022-05-16 | Discharge status: Against Medical Advice | Payer: Self-pay | Attending: Emergency Medicine | Admitting: Emergency Medicine

## 2022-05-16 NOTE — ED Notes (Signed)
Went to get patient to be seen by provider, the pt gatherered all her belongings and came to the doorway of the room, started rambling, turned around and laid back on the bench. I asked her several times if she wanted to see the doctor, she said "no!"

## 2022-05-16 NOTE — ED Notes (Signed)
Pt asleep on bench in lobby  When asked if she wanted to be seen by the dr she stated that she did not

## 2022-05-17 ENCOUNTER — Encounter (HOSPITAL_COMMUNITY): Payer: Self-pay

## 2022-05-17 ENCOUNTER — Emergency Department (HOSPITAL_COMMUNITY)
Admission: EM | Admit: 2022-05-17 | Discharge: 2022-05-18 | Disposition: A | Discharge status: Home / Self Care | Payer: Self-pay | Attending: Emergency Medicine | Admitting: Emergency Medicine

## 2022-05-17 ENCOUNTER — Other Ambulatory Visit: Payer: Self-pay

## 2022-05-17 DIAGNOSIS — M791 Myalgia, unspecified site: Secondary | ICD-10-CM | POA: Insufficient documentation

## 2022-05-17 DIAGNOSIS — Z5900 Homelessness unspecified: Secondary | ICD-10-CM | POA: Insufficient documentation

## 2022-05-17 DIAGNOSIS — J029 Acute pharyngitis, unspecified: Secondary | ICD-10-CM | POA: Insufficient documentation

## 2022-05-17 NOTE — ED Provider Triage Note (Signed)
Emergency Medicine Provider Triage Evaluation Note  Betty Henderson , a 62 y.o. female  was evaluated in triage.  Pt complains of bodyaches, feeling cold, she is homeless, she was seen recently at [facility], she reports that she has some pain in the tongue, and wants to be evaluated for COVID, flu.  Review of Systems  Positive: Sore throat, bodyaches Negative: Chest pain, fever, chills  Physical Exam  BP 119/70 (BP Location: Right Arm)   Pulse 66   Temp 98.1 F (36.7 C) (Oral)   Resp 14   Ht '5\' 6"'$  (1.676 m)   Wt 55 kg   SpO2 100%   BMI 19.57 kg/m  Gen:   Awake, no distress   Resp:  Normal effort  MSK:   Moves extremities without difficulty  Other:    Medical Decision Making  Medically screening exam initiated at 9:25 PM.  Appropriate orders placed.  Jerina Taffe was informed that the remainder of the evaluation will be completed by another provider, this initial triage assessment does not replace that evaluation, and the importance of remaining in the ED until their evaluation is complete.  Workup initiated in triage   Dorien Chihuahua 05/17/22 2126
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
## 2022-05-18 ENCOUNTER — Emergency Department (HOSPITAL_COMMUNITY)
Admission: EM | Admit: 2022-05-18 | Discharge: 2022-05-19 | Disposition: A | Discharge status: Home / Self Care | Payer: Self-pay | Attending: Emergency Medicine | Admitting: Emergency Medicine

## 2022-05-18 ENCOUNTER — Encounter (HOSPITAL_COMMUNITY): Payer: Self-pay | Admitting: Emergency Medicine

## 2022-05-18 ENCOUNTER — Emergency Department (HOSPITAL_COMMUNITY)
Admission: EM | Admit: 2022-05-18 | Discharge: 2022-05-18 | Disposition: A | Discharge status: Home / Self Care | Payer: Self-pay | Attending: Emergency Medicine | Admitting: Emergency Medicine

## 2022-05-18 ENCOUNTER — Other Ambulatory Visit: Payer: Self-pay

## 2022-05-18 DIAGNOSIS — J029 Acute pharyngitis, unspecified: Secondary | ICD-10-CM | POA: Insufficient documentation

## 2022-05-18 DIAGNOSIS — Z5900 Homelessness unspecified: Secondary | ICD-10-CM | POA: Insufficient documentation

## 2022-05-18 DIAGNOSIS — M545 Low back pain, unspecified: Secondary | ICD-10-CM | POA: Insufficient documentation

## 2022-05-18 DIAGNOSIS — R059 Cough, unspecified: Secondary | ICD-10-CM | POA: Insufficient documentation

## 2022-05-18 LAB — GROUP A STREP BY PCR: Group A Strep by PCR: NOT DETECTED

## 2022-05-18 MED ORDER — ACETAMINOPHEN 325 MG PO TABS
650.0000 mg | ORAL_TABLET | Freq: Once | ORAL | Status: AC
  Administered 2022-05-18: 650 mg via ORAL
  Filled 2022-05-18: qty 2

## 2022-05-18 NOTE — ED Triage Notes (Signed)
Pt c/o lower back pain and requesting pain medication.

## 2022-05-18 NOTE — Discharge Instructions (Addendum)
You may take ibuprofen or Tylenol as needed for the pain.  You may use cough drops.  Please schedule an appointment with your primary care provider to be seen regarding recent ER visits.  Symptoms worsen please return to ER.

## 2022-05-18 NOTE — ED Triage Notes (Signed)
Pt requesting cough medicine. Pt states she is having sore throat and a cough. Recent dx with viral pharyngitis - pt denies any changes since last being seen.

## 2022-05-18 NOTE — ED Notes (Signed)
Pt called several times for room, no answer.

## 2022-05-18 NOTE — ED Provider Notes (Signed)
[facility] Provider Note   CSN: [id number] Arrival date & time: 05/17/22  2048     History  Chief Complaint  Patient presents with   Generalized Body Aches    Betty Henderson is a 62 y.o. female.  HPI   Medical history including schizophrenia, arthritis, chronic pain, presents with complaints of sore throat, states been going on for last couple days, denies any tongue throat lip swelling difficulty breathing, no fevers chills cough congestion no stomach pain no nausea vomiting, she does endorse that she is cold, has no other complaints.    Home Medications Prior to Admission medications   Medication Sig Start Date End Date Taking? Authorizing Provider  naproxen (NAPROSYN) 500 MG tablet Take 1 tablet (500 mg total) by mouth 2 (two) times daily. [id number]   Delora Fuel, MD  ranitidine (ZANTAC) 150 MG capsule Take 1 capsule (150 mg total) by mouth daily. Patient not taking: Reported on 03/20/2018 05/31/17 01/07/19  Lacretia Leigh, MD      Allergies    Other, Lemon oil, Proanthocyanidin, Strawberry extract, Adhesive  [tape], and Cherry    Review of Systems   Review of Systems  Constitutional:  Negative for chills and fever.  HENT:  Positive for sore throat.   Respiratory:  Negative for shortness of breath.   Cardiovascular:  Negative for chest pain.  Gastrointestinal:  Negative for abdominal pain.  Neurological:  Negative for headaches.    Physical Exam Updated Vital Signs BP 105/77 (BP Location: Right Arm)   Pulse 63   Temp 98 F (36.7 C) (Oral)   Resp 18   Ht '5\' 6"'$  (1.676 m)   Wt 55 kg   SpO2 98%   BMI 19.57 kg/m  Physical Exam Vitals and nursing note reviewed.  Constitutional:      General: She is not in acute distress.    Appearance: She is not ill-appearing.  HENT:     Head: Normocephalic and atraumatic.     Nose: No congestion.     Mouth/Throat:     Mouth: Mucous membranes are moist.     Pharynx:  Oropharynx is clear. Posterior oropharyngeal erythema present. No oropharyngeal exudate.     Comments: No trismus no torticollis no oral edema present, tongue uvula both midline controlling oral secretions tonsils are equal symmetric bilaterally, patient has some slight erythema noted to posterior pharynx, as well as cobblestoning, no similar swelling no muffled tone voice. Eyes:     Conjunctiva/sclera: Conjunctivae normal.  Cardiovascular:     Rate and Rhythm: Normal rate and regular rhythm.     Pulses: Normal pulses.  Pulmonary:     Effort: Pulmonary effort is normal.  Skin:    General: Skin is warm and dry.  Neurological:     Mental Status: She is alert.  Psychiatric:        Mood and Affect: Mood normal.     ED Results / Procedures / Treatments   Labs (all labs ordered are listed, but only abnormal results are displayed) Labs Reviewed  GROUP A STREP BY PCR    EKG None  Radiology No results found.  Procedures Procedures    Medications Ordered in ED Medications - No data to display  ED Course/ Medical Decision Making/ A&P                             Medical Decision Making  This patient presents  to the ED for concern of sore throat, this involves an extensive number of treatment options, and is a complaint that carries with it a high risk of complications and morbidity.  The differential diagnosis includes retropharyngeal/peritonsillar abscess, Ludwig angina, strep throat    Additional history obtained:  Additional history obtained from N/A External records from outside source obtained and reviewed including recent ER notes   Co morbidities that complicate the patient evaluation  Psychiatric disorder  Social Determinants of Health:  Homeless    Lab Tests:  I Ordered, and personally interpreted labs.  The pertinent results include: Strep test negative   Imaging Studies ordered:  I ordered imaging studies including N/A I independently visualized  and interpreted imaging which showed N/A I agree with the radiologist interpretation   Cardiac Monitoring:  The patient was maintained on a cardiac monitor.  I personally viewed and interpreted the cardiac monitored which showed an underlying rhythm of: N/A   Medicines ordered and prescription drug management:  I ordered medication including N/A I have reviewed the patients home medicines and have made adjustments as needed  Critical Interventions:  N/A   Reevaluation:  Presents with a sore throat and being cold, she had a benign physical exam, oral temperature  within normal limits, she is not cold to the touch, she is requesting food at this time, she does have some slight erythema noted in the posterior pharynx with cobblestoning, will obtain strep test prior to foods and reassess.  Patient is resting comfortably, having no complaints, patient is in  agreement with discharge at this time     Consultations Obtained:  N/a    Test Considered:  N/a    Rule out I have low suspicion for peritonsillar abscess, retropharyngeal abscess, or Ludwig angina as oropharynx was visualized tongue and uvula were both midline, there is no exudates or edema noted in the posterior pillars or on/ around tonsils.  I doubt strep as representative negative.   Dispostion and problem list  After consideration of the diagnostic results and the patients response to treatment, I feel that the patent would benefit from discharge.  Viral pharyngitis-likely viral nature will recommend over-the-counter pain medication follow-up with PCP as needed strict return precautions.            Final Clinical Impression(s) / ED Diagnoses Final diagnoses:  Viral pharyngitis    Rx / DC Orders ED Discharge Orders     None         Marcello Fennel, PA-C 05/18/22 [id number]    Palumbo, April, MD 05/18/22 [id number]

## 2022-05-18 NOTE — ED Provider Notes (Signed)
[facility] Provider Note   CSN: [id number] Arrival date & time: 05/18/22  1828     History  Chief Complaint  Patient presents with   Cough    Betty Henderson is a 62 y.o. female history of chronic pain, schizophrenia presented with sore throat that is been going on for the past week.  Patient states she is able to tolerate foods and fluids but that it hurts to swallow.  Patient denied feeling her airway being cut off or any facial or neck swelling.  Patient denied fevers, chills, nausea/vomiting, abdominal pain, chest pain, shortness of breath  Home Medications Prior to Admission medications   Medication Sig Start Date End Date Taking? Authorizing Provider  naproxen (NAPROSYN) 500 MG tablet Take 1 tablet (500 mg total) by mouth 2 (two) times daily. [id number]   Delora Fuel, MD  ranitidine (ZANTAC) 150 MG capsule Take 1 capsule (150 mg total) by mouth daily. Patient not taking: Reported on 03/20/2018 05/31/17 01/07/19  Lacretia Leigh, MD      Allergies    Other, Lemon oil, Proanthocyanidin, Strawberry extract, Adhesive  [tape], and Cherry    Review of Systems   Review of Systems  Respiratory:  Positive for cough.   See HPI  Physical Exam Updated Vital Signs BP 104/71 (BP Location: Left Arm)   Pulse 77   Temp 98.2 F (36.8 C) (Oral)   Resp 20   Ht '5\' 6"'$  (1.676 m)   Wt 55 kg   SpO2 100%   BMI 19.57 kg/m  Physical Exam HENT:     Mouth/Throat:     Lips: Pink.     Mouth: Mucous membranes are moist. No angioedema.     Tongue: No lesions. Tongue does not deviate from midline.     Palate: No mass.     Pharynx: Oropharynx is clear. Uvula midline. Posterior oropharyngeal erythema present.     Tonsils: No tonsillar exudate or tonsillar abscesses.     Comments: Patient able to tolerate secretions No PTA noted, no oral edema noted, no elevated mouth floor, no pus, no enlarged uvula, no cobblestoning, no trismus, no palpitated  voice Neck:     Comments: Neck is not swollen and nontender to palpation Patient able to tolerate secretions Cardiovascular:     Rate and Rhythm: Normal rate and regular rhythm.  Pulmonary:     Effort: Pulmonary effort is normal.     Breath sounds: Normal breath sounds.  Abdominal:     Tenderness: There is no abdominal tenderness. There is no guarding or rebound.  Musculoskeletal:        General: Normal range of motion.     Cervical back: Normal range of motion and neck supple.  Skin:    General: Skin is warm and dry.     Capillary Refill: Capillary refill takes less than 2 seconds.  Neurological:     General: No focal deficit present.     Mental Status: She is alert and oriented to person, place, and time.  Psychiatric:        Mood and Affect: Mood normal.        Behavior: Behavior is cooperative.     ED Results / Procedures / Treatments   Labs (all labs ordered are listed, but only abnormal results are displayed) Labs Reviewed - No data to display  EKG None  Radiology No results found.  Procedures Procedures    Medications Ordered in ED Medications - No data  to display  ED Course/ Medical Decision Making/ A&P                             Medical Decision Making  Betty Henderson 62 y.o. presented today for cough. Working DDx that I considered at this time includes, but not limited to, viral pharyngitis, strep, uveitis, Ludwig's angina.  Review of prior external notes: 09/16/2022 ED provider  Unique Tests and My Interpretation: None  Discussion with Independent Historian: None  Discussion of Management of Tests: None  Risk: Low:  - based on diagnostic testing/clinical impression and treatment plan   Risk Stratification Score: None  R/o DDx: Strep: Patient negative strep test last night Uveitis: Uvula was normal PTA: No uvular deviation PTA noted Retropharyngeal abscess: No neck swelling or trouble swallowing  Plan: Patient presented for  cough.  Patient was evaluated this morning diagnosed with viral pharyngitis: Patient had stable vitals during encounter and denied any new symptoms since being discharged this morning.  Patient had a reassuring physical exam and I educated the patient on that this is a viral illness now she may take Tylenol or ibuprofen as needed for pain along with cough drops.  I encouraged patient to follow-up with her primary care provider to be reevaluated.  Patient was given return precautions.patient stable for discharge at this time.  Patient verbalized understanding of plan.         Final Clinical Impression(s) / ED Diagnoses Final diagnoses:  Viral pharyngitis    Rx / DC Orders ED Discharge Orders     None         Elvina Sidle 05/18/22 Doran Heater    Lacretia Leigh, MD 05/19/22 1251

## 2022-05-18 NOTE — Discharge Instructions (Signed)
Likely this is a viral sore throat, should resolve on its own, recommend over-the-counter pain medication, may use home remedies like honey, tea, throat lozenges.  Follow-up with PCP as needed  Come back to the emergency department if you develop chest pain, shortness of breath, severe abdominal pain, uncontrolled nausea, vomiting, diarrhea.

## 2022-05-19 ENCOUNTER — Encounter (HOSPITAL_COMMUNITY): Payer: Self-pay | Admitting: Emergency Medicine

## 2022-05-19 ENCOUNTER — Emergency Department (HOSPITAL_COMMUNITY)
Admission: EM | Admit: 2022-05-19 | Discharge: 2022-05-20 | Disposition: A | Discharge status: Home / Self Care | Payer: Self-pay | Attending: Emergency Medicine | Admitting: Emergency Medicine

## 2022-05-19 DIAGNOSIS — Z5900 Homelessness unspecified: Secondary | ICD-10-CM | POA: Insufficient documentation

## 2022-05-19 DIAGNOSIS — M79605 Pain in left leg: Secondary | ICD-10-CM | POA: Insufficient documentation

## 2022-05-19 DIAGNOSIS — M79604 Pain in right leg: Secondary | ICD-10-CM | POA: Insufficient documentation

## 2022-05-19 MED ORDER — ACETAMINOPHEN 325 MG PO TABS: 650.0000 mg | ORAL_TABLET | Freq: Four times a day (QID) | ORAL | Status: DC | PRN

## 2022-05-19 NOTE — Discharge Instructions (Signed)
Alternate ice and heat to areas of injury 3-4 times per day to limit inflammation and spasm.  Avoid strenuous activity and heavy lifting.  Take tylenol or ibuprofen for pain.  Follow-up with a primary care doctor to ensure resolution of symptoms.  Return to the ED for any new or concerning symptoms.

## 2022-05-19 NOTE — ED Notes (Signed)
Pt was verbally loud and upset at time of discharge, would not accept paperwork or discuss discharge with the nurse

## 2022-05-19 NOTE — ED Provider Notes (Signed)
  [facility] Provider Note   CSN: [id number] Arrival date & time: 05/19/22  2110     History {Add pertinent medical, surgical, social history, OB history to HPI:1} Chief Complaint  Patient presents with   Leg Pain    Betty Henderson is a 62 y.o. female with 15 prior visits to the ED this year, with 2 visits  yesterday evening who presents with concern for leg pain. Patient with history of homelessness, malingering in the ED.  HPI     Home Medications Prior to Admission medications   Medication Sig Start Date End Date Taking? Authorizing Provider  naproxen (NAPROSYN) 500 MG tablet Take 1 tablet (500 mg total) by mouth 2 (two) times daily. [id number]   Delora Fuel, MD  ranitidine (ZANTAC) 150 MG capsule Take 1 capsule (150 mg total) by mouth daily. Patient not taking: Reported on 03/20/2018 05/31/17 01/07/19  Lacretia Leigh, MD      Allergies    Other, Lemon oil, Proanthocyanidin, Strawberry extract, Adhesive  [tape], and Cherry    Review of Systems   Review of Systems  Physical Exam Updated Vital Signs BP 114/69 (BP Location: Right Arm)   Pulse 66   Temp 98.3 F (36.8 C) (Oral)   Resp 16   SpO2 100%  Physical Exam  ED Results / Procedures / Treatments   Labs (all labs ordered are listed, but only abnormal results are displayed) Labs Reviewed - No data to display  EKG None  Radiology No results found.  Procedures Procedures  {Document cardiac monitor, telemetry assessment procedure when appropriate:1}  Medications Ordered in ED Medications  acetaminophen (TYLENOL) tablet 650 mg (has no administration in time range)    ED Course/ Medical Decision Making/ A&P   {   Click here for ABCD2, HEART and other calculatorsREFRESH Note before signing :1}                          Medical Decision Making  ***  {Document critical care time when appropriate:1} {Document review of labs and clinical decision tools ie  heart score, Chads2Vasc2 etc:1}  {Document your independent review of radiology images, and any outside records:1} {Document your discussion with family members, caretakers, and with consultants:1} {Document social determinants of health affecting pt's care:1} {Document your decision making why or why not admission, treatments were needed:1} Final Clinical Impression(s) / ED Diagnoses Final diagnoses:  None    Rx / DC Orders ED Discharge Orders     None

## 2022-05-19 NOTE — ED Triage Notes (Signed)
Pt here with c/o bil leg pain

## 2022-05-19 NOTE — ED Provider Notes (Signed)
[facility] Provider Note   CSN: [id number] Arrival date & time: 05/18/22  2207     History  Chief Complaint  Patient presents with   Back Pain    Betty Henderson is a 62 y.o. female.  62 year old female presents to the emergency department complaining of back pain.  She denies any trauma or injury.  Was given some Tylenol which she states has alleviated her pain.  She presently has no complaints.  She reports that she needs somewhere that she can rest for the night.  Denies fevers, dysuria.  Seen a few hours ago in this department for complaints of sore throat.  The history is provided by the patient. No language interpreter was used.  Back Pain      Home Medications Prior to Admission medications   Medication Sig Start Date End Date Taking? Authorizing Provider  naproxen (NAPROSYN) 500 MG tablet Take 1 tablet (500 mg total) by mouth 2 (two) times daily. [id number]   Delora Fuel, MD  ranitidine (ZANTAC) 150 MG capsule Take 1 capsule (150 mg total) by mouth daily. Patient not taking: Reported on 03/20/2018 05/31/17 01/07/19  Lacretia Leigh, MD      Allergies    Other, Lemon oil, Proanthocyanidin, Strawberry extract, Adhesive  [tape], and Cherry    Review of Systems   Review of Systems  Musculoskeletal:  Positive for back pain.  Ten systems reviewed and are negative for acute change, except as noted in the HPI.    Physical Exam Updated Vital Signs BP (!) 141/80 (BP Location: Right Arm)   Pulse 72   Temp 97.8 F (36.6 C)   Resp 18   Ht '5\' 6"'$  (1.676 m)   Wt 55 kg   SpO2 99%   BMI 19.57 kg/m  Physical Exam Vitals and nursing note reviewed.  Constitutional:      General: She is not in acute distress.    Appearance: She is well-developed. She is not diaphoretic.     Comments: Nontoxic-appearing female  HENT:     Head: Normocephalic and atraumatic.  Eyes:     General: No scleral icterus.    Conjunctiva/sclera:  Conjunctivae normal.  Pulmonary:     Effort: Pulmonary effort is normal. No respiratory distress.     Comments: Respirations even and unlabored Musculoskeletal:        General: Normal range of motion.     Cervical back: Normal range of motion.  Skin:    General: Skin is warm and dry.     Coloration: Skin is not pale.     Findings: No erythema or rash.  Neurological:     Mental Status: She is alert and oriented to person, place, and time.     Comments: Moving all extremities spontaneously.  Ambulatory in the department without difficulty.  Psychiatric:        Behavior: Behavior normal.     ED Results / Procedures / Treatments   Labs (all labs ordered are listed, but only abnormal results are displayed) Labs Reviewed - No data to display  EKG None  Radiology No results found.  Procedures Procedures    Medications Ordered in ED Medications  acetaminophen (TYLENOL) tablet 650 mg (650 mg Oral Given 05/18/22 2233)    ED Course/ Medical Decision Making/ A&P                             Medical Decision  Making Risk OTC drugs.   This patient presents to the ED for concern of back pain, this involves an extensive number of treatment options, and is a complaint that carries with it a high risk of complications and morbidity.  The differential diagnosis includes strain vs UTI vs fracture vs radiculopathy vs cauda equina   Co morbidities that complicate the patient evaluation  Chronic pain Schizophrenia    Cardiac Monitoring:  The patient was maintained on a cardiac monitor.  I personally viewed and interpreted the cardiac monitored which showed an underlying rhythm of: NSR   Medicines ordered and prescription drug management:  I ordered medication including tylenol for pain  Reevaluation of the patient after these medicines showed that the patient improved I have reviewed the patients home medicines and have made adjustments as needed   Test  Considered:  UA   Problem List / ED Course:  Neurovascularly intact and ambulatory in the department.  Seen a few hours ago for complaints of sore throat.  Patient requesting to stay in the bed to sleep for the night.  Presentation to the department consistent with malingering.   Reevaluation:  After the interventions noted above, I reevaluated the patient and found that they have :stayed the same   Social Determinants of Health:  Homeless    Dispostion:  After consideration of the diagnostic results and the patients response to treatment, I feel that the patent would benefit from PCP follow up and ongoing supportive care with OTC analgesics.          Final Clinical Impression(s) / ED Diagnoses Final diagnoses:  Acute low back pain, unspecified back pain laterality, unspecified whether sciatica present    Rx / DC Orders ED Discharge Orders     None         Antonietta Breach, PA-C 05/19/22 0205    Merryl Hacker, MD 05/21/22 [phone]

## 2022-05-20 ENCOUNTER — Emergency Department (HOSPITAL_COMMUNITY)
Admission: EM | Admit: 2022-05-20 | Discharge: 2022-05-21 | Disposition: A | Discharge status: Home / Self Care | Payer: Self-pay | Attending: Emergency Medicine | Admitting: Emergency Medicine

## 2022-05-20 ENCOUNTER — Encounter (HOSPITAL_COMMUNITY): Payer: Self-pay

## 2022-05-20 DIAGNOSIS — Z5181 Encounter for therapeutic drug level monitoring: Secondary | ICD-10-CM | POA: Insufficient documentation

## 2022-05-20 DIAGNOSIS — J029 Acute pharyngitis, unspecified: Secondary | ICD-10-CM | POA: Insufficient documentation

## 2022-05-20 DIAGNOSIS — Z79899 Other long term (current) drug therapy: Secondary | ICD-10-CM

## 2022-05-20 NOTE — ED Provider Triage Note (Signed)
Emergency Medicine Provider Triage Evaluation Note  Betty Henderson , a 62 y.o. female  was evaluated in triage.  Pt complains of sore throat and is requesting Tylenol.  Patient was seen at [facility] yesterday.  She frequents the ED and has history of malingering and homelessness.    Review of Systems  Positive: As above Negative: As above  Physical Exam  BP (!) 103/91   Pulse 62   Temp 97.7 F (36.5 C) (Oral)   Resp 16   SpO2 100%  Gen:   Awake, no distress   Resp:  Normal effort  MSK:   Moves extremities without difficulty  Other:    Medical Decision Making  Medically screening exam initiated at 11:29 PM.  Appropriate orders placed.  Cathleen Niehues was informed that the remainder of the evaluation will be completed by another provider, this initial triage assessment does not replace that evaluation, and the importance of remaining in the ED until their evaluation is complete.     Theressa Stamps R, [place] 05/20/22 2330

## 2022-05-20 NOTE — Discharge Instructions (Signed)
Follow up with your primary care doctor and return to the ER with any new severe symptoms.

## 2022-05-20 NOTE — ED Triage Notes (Signed)
Pt states she has a sore throat and wants tylenol

## 2022-05-21 NOTE — ED Provider Notes (Signed)
[facility] EMERGENCY DEPARTMENT AT [facility] Provider Note   CSN: [id number] Arrival date & time: 05/20/22  2251     History  Chief Complaint  Patient presents with   Sore Throat    Betty Henderson is a 62 y.o. female.  The history is provided by the patient and medical records.  Sore Throat   62 y.o. F with hx of schizoaffective disorder, anemia, psychosis, malingering, presenting to the ED for sore throat.  States she wants some cough medicine and some tylenol.  When asked if she is feeling poorly or to explain symptoms further she states "I told y'all what the fuck was wrong with me, stop asking questions".  Of note, patient with 24 visits in the past 6 months.  Home Medications Prior to Admission medications   Medication Sig Start Date End Date Taking? Authorizing Provider  naproxen (NAPROSYN) 500 MG tablet Take 1 tablet (500 mg total) by mouth 2 (two) times daily. [id number]   Delora Fuel, MD  ranitidine (ZANTAC) 150 MG capsule Take 1 capsule (150 mg total) by mouth daily. Patient not taking: Reported on 03/20/2018 05/31/17 01/07/19  Lacretia Leigh, MD      Allergies    Other, Lemon oil, Proanthocyanidin, Strawberry extract, Adhesive  [tape], and Cherry    Review of Systems   Review of Systems  HENT:  Positive for sore throat.   All other systems reviewed and are negative.   Physical Exam Updated Vital Signs BP (!) 103/91   Pulse 62   Temp 97.7 F (36.5 C) (Oral)   Resp 16   SpO2 100%   Physical Exam Vitals and nursing note reviewed.  Constitutional:      Appearance: She is well-developed.  HENT:     Head: Normocephalic and atraumatic.     Mouth/Throat:     Comments: Multiple missing teeth Eyes:     Conjunctiva/sclera: Conjunctivae normal.     Pupils: Pupils are equal, round, and reactive to light.  Cardiovascular:     Rate and Rhythm: Normal rate and regular rhythm.     Heart sounds: Normal heart sounds.  Pulmonary:     Effort:  Pulmonary effort is normal.     Breath sounds: Normal breath sounds.  Abdominal:     General: Bowel sounds are normal.     Palpations: Abdomen is soft.  Musculoskeletal:        General: Normal range of motion.     Cervical back: Normal range of motion.  Skin:    General: Skin is warm and dry.  Neurological:     Mental Status: She is alert and oriented to person, place, and time.  Psychiatric:     Comments: Agitated, cursing at this provider     ED Results / Procedures / Treatments   Labs (all labs ordered are listed, but only abnormal results are displayed) Labs Reviewed - No data to display  EKG None  Radiology No results found.  Procedures Procedures    Medications Ordered in ED Medications - No data to display  ED Course/ Medical Decision Making/ A&P                             Medical Decision Making  62 y.o. F here requesting cough medicine and Tylenol.  When asked to elaborate about her symptoms she began cursing at me and refusing to answer further questions.  She does not appear to be in  any acute distress.  Recent strep testing was negative.  I suspect she is here for secondary gain.  Do not feel further work-up indicated at this time.  Appears stable for discharge.  Can return here for new concerns.  Final Clinical Impression(s) / ED Diagnoses Final diagnoses:  Medication management    Rx / DC Orders ED Discharge Orders     None         Larene Pickett, PA-C 05/21/22 [id number]    Merryl Hacker, MD 05/21/22 [phone]

## 2022-05-21 NOTE — Discharge Instructions (Signed)
You can use over the counter delysm, robitussin, or similar for cough. Follow-up with your primary care doctor. Return here for new concerns.

## 2022-05-24 ENCOUNTER — Emergency Department (HOSPITAL_COMMUNITY)
Admission: EM | Admit: 2022-05-24 | Discharge: 2022-05-24 | Disposition: A | Discharge status: Home / Self Care | Payer: MEDICAID | Attending: Emergency Medicine | Admitting: Emergency Medicine

## 2022-05-24 ENCOUNTER — Encounter (HOSPITAL_COMMUNITY): Payer: Self-pay | Admitting: *Deleted

## 2022-05-24 ENCOUNTER — Other Ambulatory Visit: Payer: Self-pay

## 2022-05-24 DIAGNOSIS — R531 Weakness: Secondary | ICD-10-CM | POA: Insufficient documentation

## 2022-05-24 LAB — URINALYSIS, ROUTINE W REFLEX MICROSCOPIC
Bacteria, UA: NONE SEEN
Bilirubin Urine: NEGATIVE
Glucose, UA: NEGATIVE mg/dL
Hgb urine dipstick: NEGATIVE
Ketones, ur: NEGATIVE mg/dL
Nitrite: NEGATIVE
Protein, ur: NEGATIVE mg/dL
Specific Gravity, Urine: 1.013 (ref 1.005–1.030)
pH: 6 (ref 5.0–8.0)

## 2022-05-24 LAB — CBC
HCT: 37.6 % (ref 36.0–46.0)
Hemoglobin: 11.8 g/dL — ABNORMAL LOW (ref 12.0–15.0)
MCH: 29.9 pg (ref 26.0–34.0)
MCHC: 31.4 g/dL (ref 30.0–36.0)
MCV: 95.2 fL (ref 80.0–100.0)
Platelets: 280 10*3/uL (ref 150–400)
RBC: 3.95 MIL/uL (ref 3.87–5.11)
RDW: 13.3 % (ref 11.5–15.5)
WBC: 5.8 10*3/uL (ref 4.0–10.5)
nRBC: 0 % (ref 0.0–0.2)

## 2022-05-24 LAB — COMPREHENSIVE METABOLIC PANEL
ALT: 14 U/L (ref 0–44)
AST: 15 U/L (ref 15–41)
Albumin: 4 g/dL (ref 3.5–5.0)
Alkaline Phosphatase: 64 U/L (ref 38–126)
Anion gap: 8 (ref 5–15)
BUN: 12 mg/dL (ref 8–23)
CO2: 27 mmol/L (ref 22–32)
Calcium: 9.5 mg/dL (ref 8.9–10.3)
Chloride: 103 mmol/L (ref 98–111)
Creatinine, Ser: 0.63 mg/dL (ref 0.44–1.00)
GFR, Estimated: >60 mL/min (ref >60)
Glucose, Bld: 102 mg/dL — ABNORMAL HIGH (ref 70–99)
Potassium: 3.8 mmol/L (ref 3.5–5.1)
Sodium: 138 mmol/L (ref 135–145)
Total Bilirubin: 0.4 mg/dL (ref 0.3–1.2)
Total Protein: 7.1 g/dL (ref 6.5–8.1)

## 2022-05-24 LAB — LIPASE, BLOOD: Lipase: 51 U/L (ref 11–51)

## 2022-05-24 NOTE — ED Notes (Signed)
Pt states she is allergic to nasal swab, refusing at this time
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
## 2022-05-24 NOTE — ED Notes (Signed)
Pt declined covid swab.
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
## 2022-05-31 ENCOUNTER — Emergency Department (HOSPITAL_COMMUNITY)
Admission: EM | Admit: 2022-05-31 | Discharge: 2022-05-31 | Disposition: A | Discharge status: Home / Self Care | Payer: MEDICAID | Attending: Emergency Medicine | Admitting: Emergency Medicine

## 2022-05-31 ENCOUNTER — Encounter (HOSPITAL_COMMUNITY): Payer: Self-pay

## 2022-05-31 ENCOUNTER — Other Ambulatory Visit: Payer: Self-pay

## 2022-05-31 DIAGNOSIS — Z765 Malingerer [conscious simulation]: Secondary | ICD-10-CM | POA: Insufficient documentation

## 2022-05-31 DIAGNOSIS — S91001A Unspecified open wound, right ankle, initial encounter: Secondary | ICD-10-CM

## 2022-05-31 DIAGNOSIS — W208XXA Other cause of strike by thrown, projected or falling object, initial encounter: Secondary | ICD-10-CM | POA: Insufficient documentation

## 2022-05-31 DIAGNOSIS — Z23 Encounter for immunization: Secondary | ICD-10-CM | POA: Insufficient documentation

## 2022-05-31 DIAGNOSIS — S91011A Laceration without foreign body, right ankle, initial encounter: Secondary | ICD-10-CM | POA: Insufficient documentation

## 2022-05-31 MED ORDER — BACITRACIN ZINC 500 UNIT/GM EX OINT: TOPICAL_OINTMENT | Freq: Two times a day (BID) | CUTANEOUS | Status: DC

## 2022-05-31 MED ORDER — TETANUS-DIPHTH-ACELL PERTUSSIS 5-2.5-18.5 LF-MCG/0.5 IM SUSY
0.5000 mL | PREFILLED_SYRINGE | Freq: Once | INTRAMUSCULAR | Status: AC
  Administered 2022-05-31: 0.5 mL via INTRAMUSCULAR
  Filled 2022-05-31: qty 0.5

## 2022-05-31 NOTE — ED Provider Notes (Signed)
[facility] Provider Note   CSN: [id number] Arrival date & time: 05/31/22  1727     History  Chief Complaint  Patient presents with   Abrasion    Betty Henderson is a 62 y.o. female.  62 year old female presents with a minor wound to the back of her right Achilles/heel area.  States that chair fell on it earlier.  Also request to have her toenails trimmed.  Last tetanus unknown.       Home Medications Prior to Admission medications   Medication Sig Start Date End Date Taking? Authorizing Provider  naproxen (NAPROSYN) 500 MG tablet Take 1 tablet (500 mg total) by mouth 2 (two) times daily. [id number]   Delora Fuel, MD  ranitidine (ZANTAC) 150 MG capsule Take 1 capsule (150 mg total) by mouth daily. Patient not taking: Reported on 03/20/2018 05/31/17 01/07/19  Lacretia Leigh, MD      Allergies    Other, Lemon oil, Proanthocyanidin, Strawberry extract, Adhesive  [tape], and Cherry    Review of Systems   Review of Systems Negative except as per HPI Physical Exam Updated Vital Signs BP 128/80 (BP Location: Right Arm)   Pulse 70   Temp 98.9 F (37.2 C) (Oral)   Resp 16   Ht '5\' 6"'$  (1.676 m)   Wt 62.6 kg   SpO2 100%   BMI 22.27 kg/m  Physical Exam Vitals and nursing note reviewed.  Constitutional:      General: She is not in acute distress.    Appearance: She is well-developed. She is not diaphoretic.  HENT:     Head: Normocephalic and atraumatic.  Cardiovascular:     Pulses: Normal pulses.  Pulmonary:     Effort: Pulmonary effort is normal.  Musculoskeletal:        General: Signs of injury present. No swelling, tenderness or deformity.  Feet:     Comments: Minor skin tear to posterior right ankle. No bony tenderness, normal ROM and strength Skin:    General: Skin is warm and dry.     Findings: No erythema or rash.  Neurological:     Mental Status: She is alert and oriented to person, place, and time.  Psychiatric:         Behavior: Behavior normal.     ED Results / Procedures / Treatments   Labs (all labs ordered are listed, but only abnormal results are displayed) Labs Reviewed - No data to display  EKG None  Radiology No results found.  Procedures Procedures    Medications Ordered in ED Medications  bacitracin ointment (has no administration in time range)  Tdap (BOOSTRIX) injection 0.5 mL (has no administration in time range)    ED Course/ Medical Decision Making/ A&P                             Medical Decision Making Risk OTC drugs. Prescription drug management.   63 year old female presents with concern for wound to ankle as above.  Plan is to clean and dress ankle.  Will update tetanus.  Recommend follow-up with her PCP for toenail care.        Final Clinical Impression(s) / ED Diagnoses Final diagnoses:  Wound of right ankle, initial encounter    Rx / DC Orders ED Discharge Orders     None         Tacy Learn, PA-C 05/31/22 1738    Horton,  Alvin Critchley, DO 06/01/22 1523

## 2022-05-31 NOTE — ED Triage Notes (Signed)
Pt presents with an abrasion/avulsion to her right achilles heel that she reports she sustained after a chair fell on to it.

## 2022-05-31 NOTE — Discharge Instructions (Signed)
Please schedule an appointment to see your doctor, they can help with your toenails.

## 2022-06-04 ENCOUNTER — Emergency Department (HOSPITAL_COMMUNITY)
Admission: EM | Admit: 2022-06-04 | Discharge: 2022-06-04 | Disposition: A | Discharge status: Home / Self Care | Payer: MEDICAID | Attending: Emergency Medicine | Admitting: Emergency Medicine

## 2022-06-04 ENCOUNTER — Other Ambulatory Visit: Payer: Self-pay

## 2022-06-04 DIAGNOSIS — Z5900 Homelessness unspecified: Secondary | ICD-10-CM | POA: Insufficient documentation

## 2022-06-04 DIAGNOSIS — R197 Diarrhea, unspecified: Secondary | ICD-10-CM | POA: Insufficient documentation

## 2022-06-04 LAB — CBC WITH DIFFERENTIAL/PLATELET
Abs Immature Granulocytes: 0.02 10*3/uL (ref 0.00–0.07)
Basophils Absolute: 0 10*3/uL (ref 0.0–0.1)
Basophils Relative: 0 %
Eosinophils Absolute: 0.3 10*3/uL (ref 0.0–0.5)
Eosinophils Relative: 4 %
HCT: 38.8 % (ref 36.0–46.0)
Hemoglobin: 12.3 g/dL (ref 12.0–15.0)
Immature Granulocytes: 0 %
Lymphocytes Relative: 29 %
Lymphs Abs: 1.7 10*3/uL (ref 0.7–4.0)
MCH: 29.9 pg (ref 26.0–34.0)
MCHC: 31.7 g/dL (ref 30.0–36.0)
MCV: 94.2 fL (ref 80.0–100.0)
Monocytes Absolute: 0.4 10*3/uL (ref 0.1–1.0)
Monocytes Relative: 6 %
Neutro Abs: 3.6 10*3/uL (ref 1.7–7.7)
Neutrophils Relative %: 61 %
Platelets: 288 10*3/uL (ref 150–400)
RBC: 4.12 MIL/uL (ref 3.87–5.11)
RDW: 13.2 % (ref 11.5–15.5)
WBC: 6 10*3/uL (ref 4.0–10.5)
nRBC: 0 % (ref 0.0–0.2)

## 2022-06-04 LAB — BASIC METABOLIC PANEL
Anion gap: 9 (ref 5–15)
BUN: 9 mg/dL (ref 8–23)
CO2: 24 mmol/L (ref 22–32)
Calcium: 9.3 mg/dL (ref 8.9–10.3)
Chloride: 108 mmol/L (ref 98–111)
Creatinine, Ser: 0.72 mg/dL (ref 0.44–1.00)
GFR, Estimated: >60 mL/min (ref >60)
Glucose, Bld: 87 mg/dL (ref 70–99)
Potassium: 3.1 mmol/L — ABNORMAL LOW (ref 3.5–5.1)
Sodium: 141 mmol/L (ref 135–145)

## 2022-06-04 NOTE — ED Provider Notes (Signed)
[facility] Provider Note   CSN: [id number] Arrival date & time: 06/04/22  0414     History  Chief Complaint  Patient presents with   Diarrhea    Betty Henderson is a 62 y.o. female.  Patient presents with multiple complaints.  Patient checked in and told triage nurse that she has been having intermittent diarrhea for months.  After she was brought back to her room I went to evaluate her and she was sleeping.  When I woke her up she reports variously that it was cold outside and that she was in the area looking for a job.       Home Medications Prior to Admission medications   Medication Sig Start Date End Date Taking? Authorizing Provider  naproxen (NAPROSYN) 500 MG tablet Take 1 tablet (500 mg total) by mouth 2 (two) times daily. [id number]   Delora Fuel, MD  ranitidine (ZANTAC) 150 MG capsule Take 1 capsule (150 mg total) by mouth daily. Patient not taking: Reported on 03/20/2018 05/31/17 01/07/19  Lacretia Leigh, MD      Allergies    Other, Lemon oil, Proanthocyanidin, Strawberry extract, Adhesive  [tape], and Cherry    Review of Systems   Review of Systems  Physical Exam Updated Vital Signs BP 115/76   Pulse 66   Temp (!) 97.4 F (36.3 C) (Oral)   Resp 17   SpO2 99%  Physical Exam Vitals and nursing note reviewed.  Constitutional:      General: She is not in acute distress.    Appearance: She is well-developed.  HENT:     Head: Normocephalic and atraumatic.     Mouth/Throat:     Mouth: Mucous membranes are moist.  Eyes:     General: Vision grossly intact. Gaze aligned appropriately.     Extraocular Movements: Extraocular movements intact.     Conjunctiva/sclera: Conjunctivae normal.  Cardiovascular:     Rate and Rhythm: Normal rate and regular rhythm.     Pulses: Normal pulses.     Heart sounds: Normal heart sounds, S1 normal and S2 normal. No murmur heard.    No friction rub. No gallop.  Pulmonary:      Effort: Pulmonary effort is normal. No respiratory distress.     Breath sounds: Normal breath sounds.  Abdominal:     General: Bowel sounds are normal.     Palpations: Abdomen is soft.     Tenderness: There is no abdominal tenderness. There is no guarding or rebound.     Hernia: No hernia is present.  Musculoskeletal:        General: No swelling.     Cervical back: Full passive range of motion without pain, normal range of motion and neck supple. No spinous process tenderness or muscular tenderness. Normal range of motion.     Right lower leg: No edema.     Left lower leg: No edema.  Skin:    General: Skin is warm and dry.     Capillary Refill: Capillary refill takes less than 2 seconds.     Findings: No ecchymosis, erythema, rash or wound.  Neurological:     General: No focal deficit present.     Mental Status: She is alert and oriented to person, place, and time.     GCS: GCS eye subscore is 4. GCS verbal subscore is 5. GCS motor subscore is 6.     Cranial Nerves: Cranial nerves 2-12 are intact.  Sensory: Sensation is intact.     Motor: Motor function is intact.     Coordination: Coordination is intact.  Psychiatric:        Attention and Perception: Attention normal.        Mood and Affect: Mood normal.        Speech: Speech normal.        Behavior: Behavior normal.     ED Results / Procedures / Treatments   Labs (all labs ordered are listed, but only abnormal results are displayed) Labs Reviewed  BASIC METABOLIC PANEL - Abnormal; Notable for the following components:      Result Value   Potassium 3.1 (*)    All other components within normal limits  CBC WITH DIFFERENTIAL/PLATELET    EKG None  Radiology No results found.  Procedures Procedures    Medications Ordered in ED Medications - No data to display  ED Course/ Medical Decision Making/ A&P                             Medical Decision Making Amount and/or Complexity of Data Reviewed Labs:  ordered.   She appears well.  She has had frequent visits to multiple ERs recently.  Patient carrying all of her belongings with her, suspect that she is homeless.  Examination is unremarkable.  She did report diarrhea but she has not had any diarrhea stools here.  Abdominal exam is benign.  She appears well otherwise.  Basic workup negative, no further workup necessary.        Final Clinical Impression(s) / ED Diagnoses Final diagnoses:  Diarrhea, unspecified type    Rx / DC Orders ED Discharge Orders     None         Avant Printy, Gwenyth Allegra, MD 06/04/22 [phone]

## 2022-06-04 NOTE — ED Notes (Signed)
Patient states she is hot then cold, she has body aches, she doesn't feel well and then covers up her head to go back to sleep.

## 2022-06-04 NOTE — ED Notes (Signed)
Pt verbalized understanding of discharge instructions. Opportunity for questions provided.   Pt given soda, sandwich bag, crackers, and peanut butter.

## 2022-06-04 NOTE — Discharge Instructions (Addendum)
Take Imodium if you experience further diarrhea.  Follow-up with your doctor for further workup.

## 2022-06-04 NOTE — ED Triage Notes (Signed)
Patient reports chronic/intermittent diarrhea for several months .

## 2022-06-04 NOTE — ED Notes (Signed)
Delay in discharge d/t waiting for a bus pass.  Pt given pass and it was confirmed that she has all of her belongings.

## 2022-06-05 ENCOUNTER — Emergency Department (HOSPITAL_COMMUNITY)
Admission: EM | Admit: 2022-06-05 | Discharge: 2022-06-06 | Disposition: A | Discharge status: Home / Self Care | Payer: MEDICAID | Attending: Emergency Medicine | Admitting: Emergency Medicine

## 2022-06-05 ENCOUNTER — Encounter (HOSPITAL_COMMUNITY): Payer: Self-pay | Admitting: Emergency Medicine

## 2022-06-05 ENCOUNTER — Other Ambulatory Visit: Payer: Self-pay

## 2022-06-05 DIAGNOSIS — R197 Diarrhea, unspecified: Secondary | ICD-10-CM | POA: Insufficient documentation

## 2022-06-05 DIAGNOSIS — Z5900 Homelessness unspecified: Secondary | ICD-10-CM | POA: Insufficient documentation

## 2022-06-05 NOTE — ED Provider Triage Note (Signed)
Emergency Medicine Provider Triage Evaluation Note  Betty Henderson , a 62 y.o. female  was evaluated in triage.  Pt complains of intermittent diarrhea and "feeling bad". States she is "trying to get better" and feels she needs some rest. Initially complained of "keeps passing out" but does not complain of this now.  Of note, 28 ER visits in the past 6 months  Review of Systems  Positive: diarrhea Negative:   Physical Exam  BP 113/87 (BP Location: Right Arm)   Pulse 77   Temp 98.2 F (36.8 C) (Oral)   Resp 18   Ht '5\' 6"'$  (1.676 m)   Wt 61.2 kg   SpO2 100%   BMI 21.79 kg/m  Gen:   Awake, no distress   Resp:  Normal effort  MSK:   Moves extremities without difficulty  Other:    Medical Decision Making  Medically screening exam initiated at 11:26 PM.  Appropriate orders placed.  Betty Henderson was informed that the remainder of the evaluation will be completed by another provider, this initial triage assessment does not replace that evaluation, and the importance of remaining in the ED until their evaluation is complete.  Last had blood work yesterday AM, potassium 3.1 but otherwise all within normal limits. Will repeat to recheck electrolytes   Betty Henderson T, PA-C 06/05/22 2328

## 2022-06-05 NOTE — ED Triage Notes (Signed)
Pt c/o intermittent diarrhea and feeling bad. Pt states she is trying to get better and needs some rest.

## 2022-06-06 LAB — BASIC METABOLIC PANEL
Anion gap: 11 (ref 5–15)
BUN: 17 mg/dL (ref 8–23)
CO2: 22 mmol/L (ref 22–32)
Calcium: 9.2 mg/dL (ref 8.9–10.3)
Chloride: 106 mmol/L (ref 98–111)
Creatinine, Ser: 0.86 mg/dL (ref 0.44–1.00)
GFR, Estimated: >60 mL/min (ref >60)
Glucose, Bld: 170 mg/dL — ABNORMAL HIGH (ref 70–99)
Potassium: 3.6 mmol/L (ref 3.5–5.1)
Sodium: 139 mmol/L (ref 135–145)

## 2022-06-06 LAB — CBC
HCT: 34.4 % — ABNORMAL LOW (ref 36.0–46.0)
Hemoglobin: 10.7 g/dL — ABNORMAL LOW (ref 12.0–15.0)
MCH: 29.6 pg (ref 26.0–34.0)
MCHC: 31.1 g/dL (ref 30.0–36.0)
MCV: 95.3 fL (ref 80.0–100.0)
Platelets: 281 10*3/uL (ref 150–400)
RBC: 3.61 MIL/uL — ABNORMAL LOW (ref 3.87–5.11)
RDW: 13.2 % (ref 11.5–15.5)
WBC: 5 10*3/uL (ref 4.0–10.5)
nRBC: 0 % (ref 0.0–0.2)

## 2022-06-06 NOTE — Discharge Instructions (Addendum)
Continue to monitor how you're doing and return to the ER for new or worsening symptoms.

## 2022-06-06 NOTE — ED Provider Notes (Signed)
[facility] Provider Note   CSN: [id number] Arrival date & time: 06/05/22  2255     History  Chief Complaint  Patient presents with   Diarrhea    Betty Henderson is a 62 y.o. female.   Pt complains of intermittent diarrhea and "feeling bad". States she is "trying to get better" and feels she needs some rest. Initially complained of "keeps passing out" but does not complain of this now.   Of note, 28 ER visits in the past 6 months   Diarrhea      Home Medications Prior to Admission medications   Medication Sig Start Date End Date Taking? Authorizing Provider  naproxen (NAPROSYN) 500 MG tablet Take 1 tablet (500 mg total) by mouth 2 (two) times daily. [id number]   Delora Fuel, MD  ranitidine (ZANTAC) 150 MG capsule Take 1 capsule (150 mg total) by mouth daily. Patient not taking: Reported on 03/20/2018 05/31/17 01/07/19  Lacretia Leigh, MD      Allergies    Other, Lemon oil, Proanthocyanidin, Strawberry extract, Adhesive  [tape], and Cherry    Review of Systems   Review of Systems  Gastrointestinal:  Positive for diarrhea.  All other systems reviewed and are negative.   Physical Exam Updated Vital Signs BP 113/87 (BP Location: Right Arm)   Pulse 77   Temp 98.2 F (36.8 C) (Oral)   Resp 18   Ht '5\' 6"'$  (1.676 m)   Wt 61.2 kg   SpO2 100%   BMI 21.79 kg/m  Physical Exam Vitals and nursing note reviewed.  Constitutional:      Appearance: Normal appearance.  HENT:     Head: Normocephalic and atraumatic.  Eyes:     Conjunctiva/sclera: Conjunctivae normal.  Pulmonary:     Effort: Pulmonary effort is normal. No respiratory distress.  Abdominal:     General: Abdomen is flat. There is no distension.     Palpations: Abdomen is soft.     Tenderness: There is no abdominal tenderness. There is no guarding.  Skin:    General: Skin is warm and dry.  Neurological:     Mental Status: She is alert.  Psychiatric:         Mood and Affect: Mood normal.        Behavior: Behavior normal.     ED Results / Procedures / Treatments   Labs (all labs ordered are listed, but only abnormal results are displayed) Labs Reviewed  CBC - Abnormal; Notable for the following components:      Result Value   RBC 3.61 (*)    Hemoglobin 10.7 (*)    HCT 34.4 (*)    All other components within normal limits  BASIC METABOLIC PANEL - Abnormal; Notable for the following components:   Glucose, Bld 170 (*)    All other components within normal limits    EKG None  Radiology No results found.  Procedures Procedures    Medications Ordered in ED Medications - No data to display  ED Course/ Medical Decision Making/ A&P                             Medical Decision Making Amount and/or Complexity of Data Reviewed Labs: ordered.   This patient is a 62 y.o. female  who presents to the ED for concern of diarrhea and "feeling bad". Also reports being homeless and "needing rest".   Differential diagnoses prior  to evaluation: The emergent differential diagnosis includes, but is not limited to,  Infectious causes such as: Viral, Bacterial, Parasitic; Toxin. Noninfectious causes such as: GI Bleed, Appendicitis, Mesenteric Ischemia, Diverticulitis, endocrine causes (adrenal, thyroid), Toxicologic exposures, Drug-associated, IBD . This is not an exhaustive differential.   Past Medical History / Co-morbidities: Affective disorder, anemia, arthritis, psychosis, chronic pain, malingering, osteoporosis  Additional history: Chart reviewed. Pertinent results include: Of note 28 ER visits in the past 6 months, most recently 3/12.  Potassium 3.1, otherwise lab work was unremarkable.  Physical Exam: Physical exam performed. The pertinent findings include: Normal vital signs, no acute distress.  Abdomen soft nontender.  Lab Tests/Imaging studies: I personally interpreted labs/imaging and the pertinent results include: Hemoglobin  10.7, decreased compared to prior, fairly stable..  Normal potassium.  BMP unremarkable.  Disposition: After consideration of the diagnostic results and the patients response to treatment, I feel that emergency department workup does not suggest an emergent condition requiring admission or immediate intervention beyond what has been performed at this time. The plan is: Discharged home with resources.  Patient states that she will "take care of the diarrhea herself".  Clinically well-appearing, and workup reassuring.. The patient is safe for discharge and has been instructed to return immediately for worsening symptoms, change in symptoms or any other concerns.  Final Clinical Impression(s) / ED Diagnoses Final diagnoses:  Diarrhea, unspecified type  Homelessness    Rx / DC Orders ED Discharge Orders     None      Portions of this report may have been transcribed using voice recognition software. Every effort was made to ensure accuracy; however, inadvertent computerized transcription errors may be present.    Kateri Plummer, PA-C 06/06/22 0302    Orpah Greek, MD 06/06/22 [phone]

## 2022-06-07 ENCOUNTER — Encounter (HOSPITAL_COMMUNITY): Payer: Self-pay | Admitting: Emergency Medicine

## 2022-06-07 ENCOUNTER — Emergency Department (HOSPITAL_COMMUNITY)
Admission: EM | Admit: 2022-06-07 | Discharge: 2022-06-07 | Disposition: A | Discharge status: Home / Self Care | Payer: MEDICAID | Attending: Emergency Medicine | Admitting: Emergency Medicine

## 2022-06-07 ENCOUNTER — Other Ambulatory Visit: Payer: Self-pay

## 2022-06-07 DIAGNOSIS — Z5900 Homelessness unspecified: Secondary | ICD-10-CM | POA: Insufficient documentation

## 2022-06-07 DIAGNOSIS — R112 Nausea with vomiting, unspecified: Secondary | ICD-10-CM | POA: Insufficient documentation

## 2022-06-07 DIAGNOSIS — R197 Diarrhea, unspecified: Secondary | ICD-10-CM | POA: Insufficient documentation

## 2022-06-07 DIAGNOSIS — R1084 Generalized abdominal pain: Secondary | ICD-10-CM | POA: Insufficient documentation

## 2022-06-07 DIAGNOSIS — Z1152 Encounter for screening for COVID-19: Secondary | ICD-10-CM | POA: Insufficient documentation

## 2022-06-07 LAB — CBC WITH DIFFERENTIAL/PLATELET
Abs Immature Granulocytes: 0.02 10*3/uL (ref 0.00–0.07)
Basophils Absolute: 0 10*3/uL (ref 0.0–0.1)
Basophils Relative: 0 %
Eosinophils Absolute: 0.1 10*3/uL (ref 0.0–0.5)
Eosinophils Relative: 2 %
HCT: 38.4 % (ref 36.0–46.0)
Hemoglobin: 12.6 g/dL (ref 12.0–15.0)
Immature Granulocytes: 0 %
Lymphocytes Relative: 3 %
Lymphs Abs: 0.2 10*3/uL — ABNORMAL LOW (ref 0.7–4.0)
MCH: 30.2 pg (ref 26.0–34.0)
MCHC: 32.8 g/dL (ref 30.0–36.0)
MCV: 92.1 fL (ref 80.0–100.0)
Monocytes Absolute: 0.1 10*3/uL (ref 0.1–1.0)
Monocytes Relative: 2 %
Neutro Abs: 6.3 10*3/uL (ref 1.7–7.7)
Neutrophils Relative %: 93 %
Platelets: 291 10*3/uL (ref 150–400)
RBC: 4.17 MIL/uL (ref 3.87–5.11)
RDW: 13.2 % (ref 11.5–15.5)
WBC: 6.7 10*3/uL (ref 4.0–10.5)
nRBC: 0 % (ref 0.0–0.2)

## 2022-06-07 LAB — COMPREHENSIVE METABOLIC PANEL
ALT: 14 U/L (ref 0–44)
AST: 21 U/L (ref 15–41)
Albumin: 4.1 g/dL (ref 3.5–5.0)
Alkaline Phosphatase: 68 U/L (ref 38–126)
Anion gap: 9 (ref 5–15)
BUN: 13 mg/dL (ref 8–23)
CO2: 23 mmol/L (ref 22–32)
Calcium: 9.1 mg/dL (ref 8.9–10.3)
Chloride: 104 mmol/L (ref 98–111)
Creatinine, Ser: 0.62 mg/dL (ref 0.44–1.00)
GFR, Estimated: >60 mL/min (ref >60)
Glucose, Bld: 145 mg/dL — ABNORMAL HIGH (ref 70–99)
Potassium: 3.6 mmol/L (ref 3.5–5.1)
Sodium: 136 mmol/L (ref 135–145)
Total Bilirubin: 1.1 mg/dL (ref 0.3–1.2)
Total Protein: 7.1 g/dL (ref 6.5–8.1)

## 2022-06-07 LAB — RESP PANEL BY RT-PCR (RSV, FLU A&B, COVID)  RVPGX2
Influenza A by PCR: NEGATIVE
Influenza B by PCR: NEGATIVE
Resp Syncytial Virus by PCR: NEGATIVE
SARS Coronavirus 2 by RT PCR: NEGATIVE

## 2022-06-07 LAB — LIPASE, BLOOD: Lipase: 42 U/L (ref 11–51)

## 2022-06-07 MED ORDER — ONDANSETRON HCL 4 MG PO TABS: 4.0000 mg | ORAL_TABLET | Freq: Three times a day (TID) | ORAL | 0 refills | Status: DC | PRN

## 2022-06-07 MED ORDER — ONDANSETRON 4 MG PO TBDP
8.0000 mg | ORAL_TABLET | Freq: Once | ORAL | Status: AC
  Administered 2022-06-07: 8 mg via ORAL
  Filled 2022-06-07: qty 2

## 2022-06-07 MED ORDER — ACETAMINOPHEN 325 MG PO TABS
650.0000 mg | ORAL_TABLET | Freq: Once | ORAL | Status: AC
  Administered 2022-06-07: 650 mg via ORAL
  Filled 2022-06-07: qty 2

## 2022-06-07 NOTE — ED Provider Triage Note (Signed)
Emergency Medicine Provider Triage Evaluation Note  Betty Henderson , a 62 y.o. female  was evaluated in triage.  Pt complains of nausea, vomiting, diarrhea, abdominal pain.  States it began last night.  Unable to tell me how many episodes of emesis or diarrhea she has had.  Review of Systems  Positive: As above Negative: As above  Physical Exam  BP 132/65 (BP Location: Right Arm)   Pulse 79   Temp (!) 100.7 F (38.2 C) (Oral)   Resp 16   Ht 5\' 6"  (1.676 m)   Wt 61.2 kg   SpO2 98%   BMI 21.78 kg/m  Gen:   Awake, no distress, disheveled Resp:  Normal effort  MSK:   Moves extremities without difficulty  Other:  No abdominal TTP  Medical Decision Making  Medically screening exam initiated at 3:18 PM.  Appropriate orders placed.  Shereece Socorro was informed that the remainder of the evaluation will be completed by another provider, this initial triage assessment does not replace that evaluation, and the importance of remaining in the ED until their evaluation is complete.     Roylene Reason, [place] 06/07/22 1519

## 2022-06-07 NOTE — ED Triage Notes (Signed)
Pt c/o vomiting and diarrhea. Unable to say for how long. Pt states she thinks she has food poisoning.

## 2022-06-07 NOTE — ED Provider Notes (Signed)
[facility] Provider Note   CSN: [id number] Arrival date & time: 06/07/22  1450     History  Chief Complaint  Patient presents with   Emesis    Betty Henderson is a 62 y.o. female.  Patient presents emergency department complaining of nausea, vomiting, diarrhea.  Patient states that it began last night.  She is unable to quantify the number of episodes of either emesis or diarrhea.  She complains of generalized abdominal pain.  She denies chest pain, shortness of breath, urinary symptoms, headache.  Upon arrival patient had a temperature of 100.7 F.  The patient has been seen each of the past 2 days at the same emergency department due to homelessness and diarrhea.  The patient has 43 emergency department visits over the past 6 months.  Past medical history significant for psychosis, schizoaffective disorder, chronic pain  HPI     Home Medications Prior to Admission medications   Medication Sig Start Date End Date Taking? Authorizing Provider  ondansetron (ZOFRAN) 4 MG tablet Take 1 tablet (4 mg total) by mouth every 8 (eight) hours as needed for nausea or vomiting. 06/07/22  Yes Cherlynn June B, PA-C  naproxen (NAPROSYN) 500 MG tablet Take 1 tablet (500 mg total) by mouth 2 (two) times daily. [id number]   Delora Fuel, MD  ranitidine (ZANTAC) 150 MG capsule Take 1 capsule (150 mg total) by mouth daily. Patient not taking: Reported on 03/20/2018 05/31/17 01/07/19  Lacretia Leigh, MD      Allergies    Other, Lemon oil, Proanthocyanidin, Strawberry extract, Adhesive  [tape], and Cherry    Review of Systems   Review of Systems  Respiratory:  Negative for shortness of breath.   Gastrointestinal:  Positive for abdominal pain, diarrhea, nausea and vomiting.    Physical Exam Updated Vital Signs BP (!) 97/44   Pulse 84   Temp (!) 100.7 F (38.2 C) (Oral)   Resp 19   Ht 5\' 6"  (1.676 m)   Wt 61.2 kg   SpO2 95%   BMI 21.78 kg/m   Physical Exam Vitals and nursing note reviewed.  Constitutional:      General: She is not in acute distress.    Appearance: She is well-developed.  HENT:     Head: Normocephalic and atraumatic.  Eyes:     Conjunctiva/sclera: Conjunctivae normal.  Cardiovascular:     Rate and Rhythm: Normal rate and regular rhythm.     Heart sounds: No murmur heard. Pulmonary:     Effort: Pulmonary effort is normal. No respiratory distress.     Breath sounds: Normal breath sounds.  Abdominal:     Palpations: Abdomen is soft.     Tenderness: There is no abdominal tenderness.  Musculoskeletal:        General: No swelling.     Cervical back: Neck supple.  Skin:    General: Skin is warm and dry.     Capillary Refill: Capillary refill takes less than 2 seconds.  Neurological:     Mental Status: She is alert.  Psychiatric:        Mood and Affect: Mood normal.     ED Results / Procedures / Treatments   Labs (all labs ordered are listed, but only abnormal results are displayed) Labs Reviewed  COMPREHENSIVE METABOLIC PANEL - Abnormal; Notable for the following components:      Result Value   Glucose, Bld 145 (*)    All other components within normal  limits  CBC WITH DIFFERENTIAL/PLATELET - Abnormal; Notable for the following components:   Lymphs Abs 0.2 (*)    All other components within normal limits  RESP PANEL BY RT-PCR (RSV, FLU A&B, COVID)  RVPGX2  LIPASE, BLOOD    EKG None  Radiology No results found.  Procedures Procedures    Medications Ordered in ED Medications  ondansetron (ZOFRAN-ODT) disintegrating tablet 8 mg (8 mg Oral Given 06/07/22 1847)  acetaminophen (TYLENOL) tablet 650 mg (650 mg Oral Given 06/07/22 1855)    ED Course/ Medical Decision Making/ A&P                             Medical Decision Making Risk OTC drugs. Prescription drug management.   This patient presents to the ED for concern of nausea, vomiting, diarrhea, this involves an extensive number  of treatment options, and is a complaint that carries with it a high risk of complications and morbidity.  The differential diagnosis includes gastroenteritis, cholecystitis, appendicitis, and others   Co morbidities that complicate the patient evaluation  History of frequent episodes of diarrhea   Additional history obtained:   External records from outside source obtained and reviewed including emergency department notes from yesterday and the day before   Lab Tests:  I Ordered, and personally interpreted labs.  The pertinent results include: Negative respiratory panel, grossly unremarkable CBC, CMP, lipase   Imaging Studies ordered:  The patient has no abdominal tenderness to palpation.  No signs of surgical abdomen.  No indication for abdominal imaging at this time   Problem List / ED Course / Critical interventions / Medication management   I ordered medication including Zofran for nausea, Tylenol for fever Reevaluation of the patient after these medicines showed that the patient improved I have reviewed the patients home medicines and have made adjustments as needed   Social Determinants of Health:  Patient is homeless   Test / Admission - Considered:  Patient's fever improved after Tylenol administration.  Patient's respiratory panel was negative.  Symptoms seem to be consistent with a viral gastroenteritis.  No signs of surgical abdomen.  No abdominal tenderness to suggest appendicitis, cholecystitis.  Lipase normal showing no signs of pancreatitis.  Plan to discharge home at this time with prescription for Zofran.  Return precautions provided        Final Clinical Impression(s) / ED Diagnoses Final diagnoses:  Nausea vomiting and diarrhea    Rx / DC Orders ED Discharge Orders          Ordered    ondansetron (ZOFRAN) 4 MG tablet  Every 8 hours PRN        06/07/22 2018              Dorothyann Peng, PA-C 06/07/22 2019    Horton, Alvin Critchley,  DO 06/07/22 2157

## 2022-06-07 NOTE — Discharge Instructions (Addendum)
You were evaluated today for nausea, vomiting, and diarrhea.  This is consistent with a viral infection.  Please drink fluids as you are able.  I have prescribed Zofran for your nausea.  Please take as directed.  Follow-up as needed with your primary care provider.  If you develop any life-threatening symptoms please return to the emergency department.

## 2022-06-08 ENCOUNTER — Other Ambulatory Visit: Payer: Self-pay

## 2022-06-08 ENCOUNTER — Emergency Department (HOSPITAL_COMMUNITY)
Admission: EM | Admit: 2022-06-08 | Discharge: 2022-06-08 | Disposition: A | Discharge status: Home / Self Care | Payer: MEDICAID | Attending: Emergency Medicine | Admitting: Emergency Medicine

## 2022-06-08 ENCOUNTER — Encounter (HOSPITAL_COMMUNITY): Payer: Self-pay | Admitting: Emergency Medicine

## 2022-06-08 DIAGNOSIS — M549 Dorsalgia, unspecified: Secondary | ICD-10-CM | POA: Insufficient documentation

## 2022-06-08 DIAGNOSIS — Z765 Malingerer [conscious simulation]: Secondary | ICD-10-CM | POA: Insufficient documentation

## 2022-06-08 DIAGNOSIS — Z5900 Homelessness unspecified: Secondary | ICD-10-CM | POA: Insufficient documentation

## 2022-06-08 NOTE — ED Notes (Addendum)
Pt refused VS, yelling expletives at staff.

## 2022-06-08 NOTE — ED Triage Notes (Signed)
Pt c/o ongoing generalized body aches.

## 2022-06-08 NOTE — ED Notes (Signed)
Security has been called, patient noncompliant with staff. Refusing to leave emergency room as this patient is discharged.

## 2022-06-08 NOTE — ED Triage Notes (Signed)
Pt reports back from "being on her feet and spinal bifida"

## 2022-06-08 NOTE — ED Provider Notes (Signed)
[facility] Provider Note   CSN: [id number] Arrival date & time: 06/08/22  0331     History  Chief Complaint  Patient presents with   Back Pain    Betty Henderson is a 62 y.o. female.   Back Pain  62 y.o. F with hx of chronic pain, schizoaffective disorder, psychosis, anemia, presenting to the ED for back pain.  This is patient's 3rd ED visit in the past 12 hours.  She was just discharged <2 hours ago after being escorted out by security.  She apparently never left hospital property.    Home Medications Prior to Admission medications   Medication Sig Start Date End Date Taking? Authorizing Provider  naproxen (NAPROSYN) 500 MG tablet Take 1 tablet (500 mg total) by mouth 2 (two) times daily. [id number]   Delora Fuel, MD  ondansetron (ZOFRAN) 4 MG tablet Take 1 tablet (4 mg total) by mouth every 8 (eight) hours as needed for nausea or vomiting. 06/07/22   Betty Peng, PA-C  ranitidine (ZANTAC) 150 MG capsule Take 1 capsule (150 mg total) by mouth daily. Patient not taking: Reported on 03/20/2018 05/31/17 01/07/19  Betty Leigh, MD      Allergies    Other, Lemon oil, Proanthocyanidin, Strawberry extract, Adhesive  [tape], and Cherry    Review of Systems   Review of Systems  Musculoskeletal:  Positive for back pain.  All other systems reviewed and are negative.   Physical Exam Updated Vital Signs BP 118/63 (BP Location: Right Arm)   Pulse 82   Temp 98.5 F (36.9 C) (Oral)   Resp 16   Ht 5\' 6"  (1.676 m)   Wt 60.8 kg   SpO2 100%   BMI 21.63 kg/m   Physical Exam Vitals and nursing note reviewed.  Constitutional:      Appearance: She is well-developed.     Comments: Sleeping on stretcher, pile of belongings at bedside  HENT:     Head: Normocephalic and atraumatic.  Eyes:     Conjunctiva/sclera: Conjunctivae normal.     Pupils: Pupils are equal, round, and reactive to light.  Cardiovascular:     Rate and Rhythm:  Normal rate and regular rhythm.     Heart sounds: Normal heart sounds.  Pulmonary:     Effort: Pulmonary effort is normal.     Breath sounds: Normal breath sounds.  Abdominal:     General: Bowel sounds are normal.     Palpations: Abdomen is soft.  Musculoskeletal:        General: Normal range of motion.     Cervical back: Normal range of motion.  Skin:    General: Skin is warm and dry.  Neurological:     Mental Status: She is alert and oriented to person, place, and time.     ED Results / Procedures / Treatments   Labs (all labs ordered are listed, but only abnormal results are displayed) Labs Reviewed - No data to display  EKG None  Radiology No results found.  Procedures Procedures    Medications Ordered in ED Medications - No data to display  ED Course/ Medical Decision Making/ A&P                             Medical Decision Making  62 y.o. F here with back pain.  3rd visit in the past 12 hours for same.  Just discharged <2 hours  ago for same, never left hospital property.  Patient sleeping, NAD.  VSS.  At this point, she has had multiple evaluations.  Does not appear to have an emergent condition.  Do not feel she needs further work-up and rather is here for secondary gain.  Stable for discharge.  Final Clinical Impression(s) / ED Diagnoses Final diagnoses:  Malingering    Rx / DC Orders ED Discharge Orders     None         Betty Pickett, PA-C 06/08/22 Betty Henderson, April, MD 06/08/22 [phone]

## 2022-06-08 NOTE — ED Provider Notes (Signed)
[facility] Provider Note   CSN: [id number] Arrival date & time: 06/08/22  0018     History  Chief Complaint  Patient presents with   Generalized Body Aches    Betty Henderson is a 62 y.o. female.   Muscle Pain This is a recurrent problem. The current episode started yesterday. The problem occurs constantly. Pertinent negatives include no chest pain, no abdominal pain, no headaches and no shortness of breath. Nothing aggravates the symptoms. Nothing relieves the symptoms. She has tried nothing for the symptoms. The treatment provided no relief.  Patient just seen and discharged for the same and apparently never left the waiting room and rechecked in.       Home Medications Prior to Admission medications   Medication Sig Start Date End Date Taking? Authorizing Provider  naproxen (NAPROSYN) 500 MG tablet Take 1 tablet (500 mg total) by mouth 2 (two) times daily. [id number]   Delora Fuel, MD  ondansetron (ZOFRAN) 4 MG tablet Take 1 tablet (4 mg total) by mouth every 8 (eight) hours as needed for nausea or vomiting. 06/07/22   Dorothyann Peng, PA-C  ranitidine (ZANTAC) 150 MG capsule Take 1 capsule (150 mg total) by mouth daily. Patient not taking: Reported on 03/20/2018 05/31/17 01/07/19  Lacretia Leigh, MD      Allergies    Other, Lemon oil, Proanthocyanidin, Strawberry extract, Adhesive  [tape], and Cherry    Review of Systems   Review of Systems  Constitutional:  Negative for fever.  HENT:  Negative for facial swelling.   Eyes:  Negative for redness.  Respiratory:  Negative for shortness of breath.   Cardiovascular:  Negative for chest pain.  Gastrointestinal:  Negative for abdominal pain.  Musculoskeletal:  Positive for myalgias.  Neurological:  Negative for headaches.  All other systems reviewed and are negative.   Physical Exam Updated Vital Signs BP 116/61 (BP Location: Right Arm)   Pulse 82   Temp 98.8 F (37.1 C)  (Oral)   Resp 16   Ht 5\' 6"  (1.676 m)   Wt 61.2 kg   SpO2 99%   BMI 21.78 kg/m  Physical Exam Vitals and nursing note reviewed.  Constitutional:      General: She is not in acute distress.    Appearance: Normal appearance. She is well-developed.  HENT:     Head: Normocephalic and atraumatic.     Nose: Nose normal.  Eyes:     Pupils: Pupils are equal, round, and reactive to light.  Cardiovascular:     Rate and Rhythm: Normal rate and regular rhythm.     Pulses: Normal pulses.     Heart sounds: Normal heart sounds.  Pulmonary:     Effort: Pulmonary effort is normal. No respiratory distress.     Breath sounds: Normal breath sounds.  Abdominal:     General: Bowel sounds are normal. There is no distension.     Palpations: Abdomen is soft.     Tenderness: There is no abdominal tenderness. There is no guarding or rebound.  Genitourinary:    Vagina: No vaginal discharge.  Musculoskeletal:        General: Normal range of motion.     Cervical back: Neck supple.  Skin:    General: Skin is warm and dry.     Capillary Refill: Capillary refill takes less than 2 seconds.     Findings: No erythema or rash.  Neurological:     General: No focal  deficit present.     Mental Status: She is alert and oriented to person, place, and time.     Deep Tendon Reflexes: Reflexes normal.  Psychiatric:        Mood and Affect: Mood normal.        Behavior: Behavior normal.     ED Results / Procedures / Treatments   Labs (all labs ordered are listed, but only abnormal results are displayed) Labs Reviewed - No data to display  EKG None  Radiology No results found.  Procedures Procedures    Medications Ordered in ED Medications - No data to display  ED Course/ Medical Decision Making/ A&P                             Medical Decision Making Patient who was just seen for myalgias has checked in again  Amount and/or Complexity of Data Reviewed External Data Reviewed: labs and  notes.  Risk Risk Details: Well appearing.  No acute issues at this time. Stable for discharge.      Final Clinical Impression(s) / ED Diagnoses Final diagnoses:  Homelessness   Return for intractable cough, coughing up blood, fevers > 100.4 unrelieved by medication, shortness of breath, intractable vomiting, chest pain, shortness of breath, weakness, numbness, changes in speech, facial asymmetry, abdominal pain, passing out, Inability to tolerate liquids or food, cough, altered mental status or any concerns. No signs of systemic illness or infection. The patient is nontoxic-appearing on exam and vital signs are within normal limits.  I have reviewed the triage vital signs and the nursing notes. Pertinent labs & imaging results that were available during my care of the patient were reviewed by me and considered in my medical decision making (see chart for details). After history, exam, and medical workup I feel the patient has been appropriately medically screened and is safe for discharge home. Pertinent diagnoses were discussed with the patient. Patient was given return precautions. Rx / DC Orders ED Discharge Orders     None         Cataleyah Colborn, MD 06/08/22 0225

## 2022-06-11 ENCOUNTER — Encounter (HOSPITAL_COMMUNITY): Payer: Self-pay

## 2022-06-11 ENCOUNTER — Emergency Department (HOSPITAL_COMMUNITY)
Admission: EM | Admit: 2022-06-11 | Discharge: 2022-06-12 | Disposition: A | Discharge status: Home / Self Care | Payer: MEDICAID | Attending: Emergency Medicine | Admitting: Emergency Medicine

## 2022-06-11 ENCOUNTER — Other Ambulatory Visit: Payer: Self-pay

## 2022-06-11 ENCOUNTER — Emergency Department (HOSPITAL_COMMUNITY)
Admission: EM | Admit: 2022-06-11 | Discharge: 2022-06-11 | Disposition: A | Discharge status: Home / Self Care | Payer: MEDICAID | Attending: Emergency Medicine | Admitting: Emergency Medicine

## 2022-06-11 DIAGNOSIS — M791 Myalgia, unspecified site: Secondary | ICD-10-CM | POA: Insufficient documentation

## 2022-06-11 DIAGNOSIS — R52 Pain, unspecified: Secondary | ICD-10-CM

## 2022-06-11 DIAGNOSIS — Z5901 Sheltered homelessness: Secondary | ICD-10-CM | POA: Insufficient documentation

## 2022-06-11 DIAGNOSIS — R0600 Dyspnea, unspecified: Secondary | ICD-10-CM | POA: Insufficient documentation

## 2022-06-11 MED ORDER — ACETAMINOPHEN 325 MG PO TABS
650.0000 mg | ORAL_TABLET | Freq: Four times a day (QID) | ORAL | Status: DC | PRN
  Administered 2022-06-11: 650 mg via ORAL
  Filled 2022-06-11: qty 2

## 2022-06-11 NOTE — ED Notes (Addendum)
Patient verbalizes understanding of discharge instructions. Opportunity for questioning and answers were provided. Pt given bus pass, food, and beverage, discharged from ED.

## 2022-06-11 NOTE — Discharge Instructions (Signed)
Please use the emergency department for emergencies.
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
## 2022-06-11 NOTE — ED Provider Notes (Signed)
[facility] Provider Note   CSN: [id number] Arrival date & time: 06/11/22  2311     History  Chief Complaint  Patient presents with   Feet Pain     Betty Henderson is a 62 y.o. female.  HPI   Patient presents to the emergency department due to generalized pain.  This is been going on for multiple years, having pain in her legs from walking around.  No recent falls or injuries.  Requesting something to eat and Tylenol.  Home Medications Prior to Admission medications   Medication Sig Start Date End Date Taking? Authorizing Provider  naproxen (NAPROSYN) 500 MG tablet Take 1 tablet (500 mg total) by mouth 2 (two) times daily. [id number]   Delora Fuel, MD  ondansetron (ZOFRAN) 4 MG tablet Take 1 tablet (4 mg total) by mouth every 8 (eight) hours as needed for nausea or vomiting. 06/07/22   Dorothyann Peng, PA-C  ranitidine (ZANTAC) 150 MG capsule Take 1 capsule (150 mg total) by mouth daily. Patient not taking: Reported on 03/20/2018 05/31/17 01/07/19  Lacretia Leigh, MD      Allergies    Other, Lemon oil, Proanthocyanidin, Strawberry extract, Adhesive  [tape], and Cherry    Review of Systems   Review of Systems  Physical Exam Updated Vital Signs BP 127/71   Pulse 64   Temp (!) 97.5 F (36.4 C) (Oral)   Resp 17   SpO2 100%  Physical Exam Vitals and nursing note reviewed. Exam conducted with a chaperone present.  Constitutional:      General: She is not in acute distress.    Appearance: Normal appearance.  HENT:     Head: Normocephalic and atraumatic.  Eyes:     General: No scleral icterus.    Extraocular Movements: Extraocular movements intact.     Pupils: Pupils are equal, round, and reactive to light.  Cardiovascular:     Rate and Rhythm: Normal rate and regular rhythm.     Pulses: Normal pulses.     Heart sounds: Normal heart sounds.  Musculoskeletal:        General: No tenderness.  Skin:    Capillary Refill:  Capillary refill takes less than 2 seconds.     Coloration: Skin is not jaundiced.     Comments: No erythema   Neurological:     Mental Status: She is alert. Mental status is at baseline.     Coordination: Coordination normal.     ED Results / Procedures / Treatments   Labs (all labs ordered are listed, but only abnormal results are displayed) Labs Reviewed - No data to display  EKG None  Radiology No results found.  Procedures Procedures    Medications Ordered in ED Medications  acetaminophen (TYLENOL) tablet 650 mg (has no administration in time range)    ED Course/ Medical Decision Making/ A&P                             Medical Decision Making Risk OTC drugs.   This is a 62 year old female presenting to the emergency department due to generalized pain.  I reviewed external medical records, she has a history of malingering, schizoaffective story and unstable housing as well as is chronic pain.  On exam she is neurovascular intact without any signs of cellulitis, ischemic limb, compartment syndrome.  There is been no fall or traumatic injury so I do not think radiographic  imaging would be indicated as low suspicion for fracture.  This is patient's 33rd ER visit in 6 months, she was actually seen and discharged from the ED at 3:33 AM this morning.  Sandwich provided, stable for discharge.         Final Clinical Impression(s) / ED Diagnoses Final diagnoses:  Generalized body aches    Rx / DC Orders ED Discharge Orders     None         Sherrill Raring, Hershal Coria 06/11/22 2348    Regan Lemming, MD 06/12/22 [phone]

## 2022-06-11 NOTE — ED Triage Notes (Signed)
Patient reports feet pain today, denies injury /ambulatory.

## 2022-06-11 NOTE — Discharge Instructions (Signed)
Substance Abuse Treatment Programs ° °Intensive Outpatient Programs °[facility]     °[address]      °[place], [place]                   °[phone]      ° °[facility] °[address] °[place], [place] °[phone] ° °[facility]     °(Inpatient and outpatient)     °[address]           °[phone]   ° °[facility] °[phone] (Suboxone and Methadone) ° °[address]      °[place], [place] [address]      °336-882-2125      ° °[address] °[place], [place] °852-3033 ° °[facility] (Outpatient/Inpatient, Chemical)    °(insurance only) [phone]      °       °[facility]Groups & Residential) °[place], [place] °[phone] ° °   °[facility]     °[address]     °[place], [place]      °[phone]      ° °[facility] (for caregivers and family) °[address] °[place], [place] °336-299-4655 ° ° ° ° ° °Residential Treatment Programs °[facility]      °[address], [place], [place] [address]  °[phone]      ° °[facility] °[address] [place], [place] [address] °[phone] ° °[facility]        °336-248-8914      ° °[facility] °1-800-659-3381 ° °[facility]             °[address]                                         °[place], [place]                                                °877-615-2722 or 336-784-9470                              ° °[facility] [place] °[address] °[place], [address] °1.877.941.8954 ° °[facility]    °[address]      °[place], [place]     °[phone]      ° °[facility] °[address] °[place], [place] °336-285-9073 ° °[facility]   °[address]     °[place], [place] [address]     °[phone]      °Admissions: 8am-3pm M-F ° °[facility] °[address] °[place],  [place] °336-227-7417 ° °[facility]: Residential Program (90 Days)   °[place], [place]      °336-725-8389 or 800-758-6077    ° °[facility] °[place], [place] °(Walk in Hours over the weekend or by referral) ° °[facility] °[address], [place], [place] [address] °([phone] ° °Crisis Mobile: [facility]:  1-877-626-1772 (for crisis response 24 hours a day) °[facility] Hotline:      1-800-256-2452 °Outpatient Psychiatry and Counseling ° °[facility]: Mobile Crisis   Management 24 hours:  1-877-626-1772 ° °[facility] sliding scale fee and walk in schedule: M-F 8am-12pm/1pm-3pm °[address]  °[place], [place] [address] °[phone] ° °[facility] °[address] °[place], [place] [address] °336-703-9650 ° °[facility] ([facility])- new patient walk-in appointments available Monday - Friday 8am -3pm.          °[address] °[place], [place] [address] °336-676-6840 or crisis line- 336-676-6905 ° °[facility]/ Intensive Outpatient Therapy Program °[address] °[place], [place] [address] °336-832-9804 ° °[facility]                  °Crisis Services      °[phone]      °[address]     °[place], [place] [address]                ° °[facility]   °[facility] °[phone] °[address] °[place], [place] [address] ° ° °[facility]          °[address]  °[place], [place] [address]       °([phone] ° °[facility] °[address] °[place], [place] [address] °336-292-1510 ° °[facility]    °[address]    °[place], [place] [address]     °336-632-3505      ° °Parish McKinney, MD     °[address]     °[place] [address]     °336-282-1251     °  °[facility] °[address] [place]  [address] °[place] [address] ° °[facility]     °[address]     °[place], [place]      °336-542-2076      ° °[facility] °Shamsher Ahluwalia, MD °[address] °[place], [place] [address] °[phone] ° °[facility]     °[address]     °[place], [place] [address]     °336-274-1237      ° °[facility] °[address] °[place], [place] [address] °336-854-4450 °Resources for Temporary Residential Assistance/Crisis Centers ° °DAY CENTERS °[facility] °M-F 8am-3pm   °[address] [place], [place] [address]   336-332-0824 °Services include: laundry, barbering, support groups, case management, phone  & computer access, showers, AA/NA mtgs, mental health/substance abuse nurse, job skills class, disability information, VA assistance, spiritual classes, etc.  ° °HOMELESS SHELTERS ° °[facility]     °[facility]   °[address], [place]     °[phone]       °       °[facility] (women and children)       °[address] °[place], [place] [address] °336-275-0820 °[email or web address] for application and process °Application Required ° °[facility]   °[address]    °[place] [address]     °[phone]       °             °[facility] °[address] °, [place] [address] °336.273.5572 °336-235-0363(schedule application appt.) °Application Required ° °[facility] (women only)    °[address]     °[place], [place] [address]     °336-884-1039      °  Intake starts 6pm daily °Need valid ID, SSC, & Police report °[facility] [place] °[address] °[place], [place] °336-881-5420 °Application Required ° °[facility] (men only)     °[address]      °[place], [place]     °[phone]      ° °[facility] °(Pregnant women only) °[address] °[place], [place] °336-275-0206 ° °[facility]      °[address]      °[place], [place] [address]     °[phone]      °       °[facility] °[address] °[place], [place] °336-723-1848 °90 day commitment/SA/Application process ° °[facility](men only)     °[address]     °[place], [place]     °[phone]       °Check-in at 7pm     °       °[facility] °[address] °[place], [place] [address] °336-248-6684 °Men/Women/Women and Children must be there by 7 pm ° °[facility] °[place], [place] °336-722-8721                ° °
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
## 2022-06-14 DIAGNOSIS — M545 Low back pain, unspecified: Secondary | ICD-10-CM | POA: Insufficient documentation

## 2022-06-27 ENCOUNTER — Other Ambulatory Visit: Payer: Self-pay

## 2022-06-27 ENCOUNTER — Encounter (HOSPITAL_BASED_OUTPATIENT_CLINIC_OR_DEPARTMENT_OTHER): Payer: Self-pay

## 2022-06-27 ENCOUNTER — Emergency Department (HOSPITAL_BASED_OUTPATIENT_CLINIC_OR_DEPARTMENT_OTHER)
Admission: EM | Admit: 2022-06-27 | Discharge: 2022-06-27 | Disposition: A | Discharge status: Home / Self Care | Payer: Medicaid Other | Attending: Emergency Medicine | Admitting: Emergency Medicine

## 2022-06-27 DIAGNOSIS — M25569 Pain in unspecified knee: Secondary | ICD-10-CM | POA: Diagnosis not present

## 2022-06-27 DIAGNOSIS — M549 Dorsalgia, unspecified: Secondary | ICD-10-CM | POA: Insufficient documentation

## 2022-06-27 DIAGNOSIS — Z765 Malingerer [conscious simulation]: Secondary | ICD-10-CM | POA: Diagnosis not present

## 2022-06-27 MED ORDER — ACETAMINOPHEN 500 MG PO TABS
1000.0000 mg | ORAL_TABLET | Freq: Once | ORAL | Status: AC
  Administered 2022-06-27: 1000 mg via ORAL
  Filled 2022-06-27: qty 2

## 2022-06-27 NOTE — ED Notes (Signed)
Pt given a warm blanket and some hospital socks at pt request. Pt states she wants to sleep in the lobby at this time and requested a pillow. Pillow not available at this time. Pt aware there will be one in a room. Pt agreeable.

## 2022-06-27 NOTE — ED Triage Notes (Signed)
Pt bib PTAR with chronic bilateral leg pain and lower back pain. Pt denies other sx.
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
## 2022-06-28 ENCOUNTER — Emergency Department (HOSPITAL_BASED_OUTPATIENT_CLINIC_OR_DEPARTMENT_OTHER)
Admission: EM | Admit: 2022-06-28 | Discharge: 2022-06-28 | Discharge status: Against Medical Advice | Payer: Medicaid Other | Attending: Emergency Medicine | Admitting: Emergency Medicine

## 2022-06-28 ENCOUNTER — Other Ambulatory Visit: Payer: Self-pay

## 2022-06-28 ENCOUNTER — Encounter (HOSPITAL_BASED_OUTPATIENT_CLINIC_OR_DEPARTMENT_OTHER): Payer: Self-pay

## 2022-06-28 ENCOUNTER — Emergency Department (HOSPITAL_BASED_OUTPATIENT_CLINIC_OR_DEPARTMENT_OTHER)
Admission: EM | Admit: 2022-06-28 | Discharge: 2022-06-29 | Disposition: A | Discharge status: Home / Self Care | Payer: Medicaid Other | Source: Home / Self Care | Attending: Emergency Medicine | Admitting: Emergency Medicine

## 2022-06-28 DIAGNOSIS — T698XXA Other specified effects of reduced temperature, initial encounter: Secondary | ICD-10-CM | POA: Insufficient documentation

## 2022-06-28 DIAGNOSIS — Z5900 Homelessness unspecified: Secondary | ICD-10-CM | POA: Insufficient documentation

## 2022-06-28 DIAGNOSIS — F17210 Nicotine dependence, cigarettes, uncomplicated: Secondary | ICD-10-CM | POA: Insufficient documentation

## 2022-06-28 DIAGNOSIS — X31XXXA Exposure to excessive natural cold, initial encounter: Secondary | ICD-10-CM | POA: Insufficient documentation

## 2022-06-28 DIAGNOSIS — R5381 Other malaise: Secondary | ICD-10-CM | POA: Diagnosis not present

## 2022-06-28 DIAGNOSIS — Z5321 Procedure and treatment not carried out due to patient leaving prior to being seen by health care provider: Secondary | ICD-10-CM | POA: Diagnosis not present

## 2022-06-28 DIAGNOSIS — M79606 Pain in leg, unspecified: Secondary | ICD-10-CM | POA: Diagnosis present

## 2022-06-28 NOTE — ED Provider Notes (Signed)
[facility] Provider Note MRN:  [id number]  Arrival date & time: 06/28/22     Chief Complaint   Leg Pain   History of Present Illness   Betty Henderson is a 62 y.o. year-old female with a history of chronic pain, schizoaffective disorder presenting to the ED with chief complaint of leg pain.  Patient complaining of chronic leg pain.  Explains that when it is cold outside it makes her feel generally unwell.  Denies any acute changes or any other symptoms.  Review of Systems  A thorough review of systems was obtained and all systems are negative except as noted in the HPI and PMH.   Patient's Health History    Past Medical History:  Diagnosis Date   Anemia    Arthritis    Chronic pain    Drug-seeking behavior    Malingering    Osteopetrosis    Psychosis    Schizoaffective disorder, bipolar type     Past Surgical History:  Procedure Laterality Date   MOUTH SURGERY     TUBAL LIGATION      Family History  Family history unknown: Yes    Social History   Socioeconomic History   Marital status: Single    Spouse name: Not on file   Number of children: Not on file   Years of education: Not on file   Highest education level: Not on file  Occupational History   Not on file  Tobacco Use   Smoking status: Every Day    Packs/day: .5    Types: Cigarettes   Smokeless tobacco: Never  Vaping Use   Vaping Use: Never used  Substance and Sexual Activity   Alcohol use: Not Currently    Comment: 1 cocktail 3 weeks ago   Drug use: Not Currently    Types: Cocaine    Comment: states "it's legal though"   Sexual activity: Never  Other Topics Concern   Not on file  Social History Narrative   Not on file   Social Determinants of Health   Financial Resource Strain: Not on file  Food Insecurity: Not on file  Transportation Needs: Not on file  Physical Activity: Not on file  Stress: Not on file  Social Connections: Not on  file  Intimate Partner Violence: Not on file     Physical Exam  Vitals: Heart rate 61 Respirations 18 blood pressure 128/61 SpO2 100% room air CONSTITUTIONAL: Chronically ill-appearing, NAD NEURO/PSYCH:  Alert and oriented x 3, no focal deficits EYES:  eyes equal and reactive ENT/NECK:  no LAD, no JVD CARDIO: Regular rate, well-perfused, normal S1 and S2 PULM:  CTAB no wheezing or rhonchi GI/GU:  non-distended, non-tender MSK/SPINE:  No gross deformities, no edema SKIN:  no rash, atraumatic   *Additional and/or pertinent findings included in MDM below  Diagnostic and Interventional Summary    EKG Interpretation  Date/Time:    Ventricular Rate:    PR Interval:    QRS Duration:   QT Interval:    QTC Calculation:   R Axis:     Text Interpretation:         Labs Reviewed - No data to display  No orders to display    Medications - No data to display   Procedures  /  Critical Care Procedures  ED Course and Medical Decision Making  Initial Impression and Ddx Patient admits that she is here because it is cold outside and trying to sleep out  in the cold is very challenging for her.  No acute process.  Past medical/surgical history that increases complexity of ED encounter: Schizoaffective disorder  Interpretation of Diagnostics Laboratory and/or imaging options to aid in the diagnosis/care of the patient were considered.  After careful history and physical examination, it was determined that there was no indication for diagnostics at this time.  Patient Reassessment and Ultimate Disposition/Management     Discharge  Patient management required discussion with the following services or consulting groups:  None  Complexity of Problems Addressed Acute complicated illness or Injury  Additional Data Reviewed and Analyzed Further history obtained from: Past medical history and medications listed in the EMR and Prior ED visit notes  Additional Factors Impacting ED  Encounter Risk None  Elmer Sow. Pilar Plate, MD [facility] Emergency Medicine [facility] [email or web address]  Final Clinical Impressions(s) / ED Diagnoses     ICD-10-CM   1. Malaise  R53.81     2. Homelessness  Z59.00       ED Discharge Orders     None        Discharge Instructions Discussed with and Provided to Patient:   Discharge Instructions   None      Sabas Sous, MD 06/28/22 2355

## 2022-06-28 NOTE — ED Triage Notes (Signed)
Chronic left leg/ hip pain. Ambulatory.

## 2022-06-28 NOTE — ED Notes (Signed)
Called patient for room, no answer, registration states they think she left

## 2022-06-28 NOTE — ED Triage Notes (Signed)
Cold outside.

## 2022-06-29 DIAGNOSIS — M25552 Pain in left hip: Secondary | ICD-10-CM | POA: Diagnosis not present

## 2022-06-29 DIAGNOSIS — G8929 Other chronic pain: Secondary | ICD-10-CM | POA: Insufficient documentation

## 2022-06-29 DIAGNOSIS — M79605 Pain in left leg: Secondary | ICD-10-CM | POA: Diagnosis not present

## 2022-06-29 NOTE — ED Notes (Signed)
Pt called second time for triage. Visitor states pt is currently in the bathroom

## 2022-06-30 ENCOUNTER — Emergency Department (HOSPITAL_BASED_OUTPATIENT_CLINIC_OR_DEPARTMENT_OTHER)
Admission: EM | Admit: 2022-06-30 | Discharge: 2022-06-30 | Discharge status: Against Medical Advice | Payer: Medicaid Other | Attending: Emergency Medicine | Admitting: Emergency Medicine

## 2022-06-30 ENCOUNTER — Other Ambulatory Visit: Payer: Self-pay

## 2022-06-30 ENCOUNTER — Encounter (HOSPITAL_BASED_OUTPATIENT_CLINIC_OR_DEPARTMENT_OTHER): Payer: Self-pay | Admitting: Emergency Medicine

## 2022-06-30 DIAGNOSIS — G8929 Other chronic pain: Secondary | ICD-10-CM

## 2022-06-30 MED ORDER — ACETAMINOPHEN 325 MG PO TABS: 650.0000 mg | ORAL_TABLET | Freq: Once | ORAL | Status: DC | PRN

## 2022-06-30 NOTE — ED Notes (Signed)
Arguing with physician. Eloped from treatment room. Ambulatory.

## 2022-06-30 NOTE — ED Triage Notes (Signed)
Pt c/o recurrently chronic left leg pain. No recent injury/trauma. Ambulatory in triage.

## 2022-06-30 NOTE — ED Provider Notes (Signed)
[place] EMERG[facility]Y DEPARTMENT AT [facility] Provider Note   CSN: [id number] Arrival date & time: 06/29/22  2348     History  Chief Complaint  Patient presents with   Leg Pain    L    Betty Henderson is a 62 y.o. female.  Level 5 caveat for psychiatric illness.  Patient well-known to the ED.  Complains of pain to her left hip and her left leg ongoing for many months.  Denies any recent fall or trauma.  Seen on a regular basis when it is cold outside.  States she was told she had arthritis in her leg and "needs pins in it".  Is asking for surgery tonight.  No fevers, chills, nausea or vomiting.  No chest pain or shortness of breath. No change in her chronic back pain.  No focal weakness, numbness or tingling.  The history is provided by the patient. The history is limited by the condition of the patient.  Leg Pain      Home Medications Prior to Admission medications   Medication Sig Start Date End Date Taking? Authorizing Provider  naproxen (NAPROSYN) 500 MG tablet Take 1 tablet (500 mg total) by mouth 2 (two) times daily. 03/28/22   Dione Booze, MD  ondansetron (ZOFRAN) 4 MG tablet Take 1 tablet (4 mg total) by mouth every 8 (eight) hours as needed for nausea or vomiting. 06/07/22   Darrick Grinder, PA-C  ranitidine (ZANTAC) 150 MG capsule Take 1 capsule (150 mg total) by mouth daily. Patient not taking: Reported on 03/20/2018 05/31/17 01/07/19  Lorre Nick, MD      Allergies    Other, Lemon oil, Proanthocyanidin, Strawberry extract, Adhesive  [tape], Cherry, and Wound dressing adhesive    Review of Systems   Review of Systems  Unable to perform ROS: Psychiatric disorder    Physical Exam Updated Vital Signs BP 126/75 (BP Location: Right Arm)   Pulse 64   Temp 97.7 F (36.5 C) (Oral)   Resp 19   Ht 5\' 6"  (1.676 m)   Wt 62.6 kg   SpO2 100%   BMI 22.27 kg/m  Physical Exam Vitals and nursing note reviewed.  Constitutional:      General: She is  not in acute distress.    Appearance: She is well-developed.     Comments: Disheveled, chronically ill-appearing  HENT:     Head: Normocephalic and atraumatic.     Mouth/Throat:     Pharynx: No oropharyngeal exudate.  Eyes:     Conjunctiva/sclera: Conjunctivae normal.     Pupils: Pupils are equal, round, and reactive to light.  Neck:     Comments: No meningismus. Cardiovascular:     Rate and Rhythm: Normal rate and regular rhythm.     Heart sounds: Normal heart sounds. No murmur heard. Pulmonary:     Effort: Pulmonary effort is normal. No respiratory distress.     Breath sounds: Normal breath sounds.  Abdominal:     Palpations: Abdomen is soft.     Tenderness: There is no abdominal tenderness. There is no guarding or rebound.  Musculoskeletal:        General: No tenderness. Normal range of motion.     Cervical back: Normal range of motion and neck supple.     Comments: Full range of motion of bilateral hips, ankles and knees without pain.  Intact DP and PT pulses.  Skin:    General: Skin is warm.  Neurological:     Mental  Status: She is alert and oriented to person, place, and time.     Cranial Nerves: No cranial nerve deficit.     Motor: No abnormal muscle tone.     Coordination: Coordination normal.     Comments:  5/5 strength throughout. CN 2-12 intact.Equal grip strength.   Psychiatric:        Behavior: Behavior normal.     ED Results / Procedures / Treatments   Labs (all labs ordered are listed, but only abnormal results are displayed) Labs Reviewed  BASIC METABOLIC PANEL  CK    EKG None  Radiology No results found.  Procedures Procedures    Medications Ordered in ED Medications  acetaminophen (TYLENOL) tablet 650 mg (has no administration in time range)    ED Course/ Medical Decision Making/ A&P                             Medical Decision Making Amount and/or Complexity of Data Reviewed Labs: ordered. Decision-making details documented in ED  Course. Radiology: ordered and independent interpretation performed. Decision-making details documented in ED Course. ECG/medicine tests: ordered and independent interpretation performed. Decision-making details documented in ED Course.  Risk OTC drugs.   Acute on chronic left hip pain.  Vital stable, no distress.  Intact distal pulses.  Low suspicion for acute limb ischemia.  Patient argumentative and belligerent when told she cannot have surgery at this facility.  States she was told to come here by "the [place] doctor".  Discussed we will be happy to evaluate her with lab work to ensure that she has no hypoglycemia or electrolyte abnormalities.  Discussed we could also obtain an x-ray to ensure no acute changes to her left hip.  Patient states she just wants a cab voucher and wants to leave. Ambulatory to the exit.  She will leave AMA.        Final Clinical Impression(s) / ED Diagnoses Final diagnoses:  Other chronic pain    Rx / DC Orders ED Discharge Orders     None         Lurleen Soltero, Jeannett Senior, MD 06/30/22 [phone]

## 2022-07-02 ENCOUNTER — Emergency Department (HOSPITAL_BASED_OUTPATIENT_CLINIC_OR_DEPARTMENT_OTHER)
Admission: EM | Admit: 2022-07-02 | Discharge: 2022-07-02 | Disposition: A | Discharge status: Home / Self Care | Payer: Medicaid Other | Attending: Emergency Medicine | Admitting: Emergency Medicine

## 2022-07-02 ENCOUNTER — Encounter (HOSPITAL_BASED_OUTPATIENT_CLINIC_OR_DEPARTMENT_OTHER): Payer: Self-pay

## 2022-07-02 ENCOUNTER — Other Ambulatory Visit: Payer: Self-pay

## 2022-07-02 DIAGNOSIS — Z765 Malingerer [conscious simulation]: Secondary | ICD-10-CM | POA: Diagnosis not present

## 2022-07-02 DIAGNOSIS — M79606 Pain in leg, unspecified: Secondary | ICD-10-CM | POA: Diagnosis present

## 2022-07-02 NOTE — ED Triage Notes (Signed)
Pt endorses chronic bilateral leg pain. Reports no change to the pain and that she "need some pills". Pt able to ambulate to the wheelchair from the EMS stretcher. Pt with even and steady gait.

## 2022-07-02 NOTE — ED Provider Notes (Signed)
   [place] EMERGENCY DEPARTMENT AT [facility]  Provider Note  CSN: [id number] Arrival date & time: 07/02/22 0246  History Chief Complaint  Patient presents with   Leg Pain    Betty Henderson is a 62 y.o. female with history of chronic pain, frequent ED visits for same brought by EMS for leg pain, similar to previous. She states she called EMS to try to get a ride closer to home in [facility] but they brought her here instead. No acute concerns.    Home Medications Prior to Admission medications   Medication Sig Start Date End Date Taking? Authorizing Provider  naproxen (NAPROSYN) 500 MG tablet Take 1 tablet (500 mg total) by mouth 2 (two) times daily. 03/28/22   Dione Booze, MD  ondansetron (ZOFRAN) 4 MG tablet Take 1 tablet (4 mg total) by mouth every 8 (eight) hours as needed for nausea or vomiting. 06/07/22   Darrick Grinder, PA-C  ranitidine (ZANTAC) 150 MG capsule Take 1 capsule (150 mg total) by mouth daily. Patient not taking: Reported on 03/20/2018 05/31/17 01/07/19  Lorre Nick, MD     Allergies    Other, Lemon oil, Proanthocyanidin, Strawberry extract, Adhesive  [tape], Cherry, and Wound dressing adhesive   Review of Systems   Review of Systems Please see HPI for pertinent positives and negatives  Physical Exam BP 109/60   Pulse 73   Temp 98 F (36.7 C)   Resp 18   Ht 5\' 6"  (1.676 m)   Wt 62.6 kg   SpO2 99%   BMI 22.28 kg/m   Physical Exam Vitals and nursing note reviewed.  HENT:     Head: Normocephalic.     Nose: Nose normal.  Eyes:     Extraocular Movements: Extraocular movements intact.  Pulmonary:     Effort: Pulmonary effort is normal.  Musculoskeletal:        General: Normal range of motion.     Cervical back: Neck supple.  Skin:    Findings: No rash (on exposed skin).  Neurological:     Mental Status: She is alert and oriented to person, place, and time.  Psychiatric:        Mood and Affect: Mood normal.     ED Results  / Procedures / Treatments   EKG None  Procedures Procedures  Medications Ordered in the ED Medications - No data to display  Initial Impression and Plan  Patient discharged from the ED. No emergent medical condition or acute concerns voiced by the patient. She was advised not to use EMS as a means to get from place to place.   ED Course       MDM Rules/Calculators/A&P Medical Decision Making Problems Addressed: Malingering: chronic illness or injury     Final Clinical Impression(s) / ED Diagnoses Final diagnoses:  Malingering    Rx / DC Orders ED Discharge Orders     None        Pollyann Savoy, MD 07/02/22 [phone]

## 2022-07-20 ENCOUNTER — Other Ambulatory Visit: Payer: Self-pay

## 2022-07-20 ENCOUNTER — Emergency Department (HOSPITAL_COMMUNITY)
Admission: EM | Admit: 2022-07-20 | Discharge: 2022-07-20 | Disposition: A | Discharge status: Home / Self Care | Payer: Medicaid Other | Attending: Emergency Medicine | Admitting: Emergency Medicine

## 2022-07-20 ENCOUNTER — Encounter (HOSPITAL_COMMUNITY): Payer: Self-pay

## 2022-07-20 DIAGNOSIS — M25552 Pain in left hip: Secondary | ICD-10-CM

## 2022-07-20 MED ORDER — KETOROLAC TROMETHAMINE 15 MG/ML IJ SOLN
15.0000 mg | Freq: Once | INTRAMUSCULAR | Status: DC
  Filled 2022-07-20: qty 1

## 2022-07-20 MED ORDER — KETOROLAC TROMETHAMINE 15 MG/ML IJ SOLN
15.0000 mg | Freq: Once | INTRAMUSCULAR | Status: AC
  Administered 2022-07-20: 15 mg via INTRAMUSCULAR

## 2022-07-20 NOTE — ED Provider Notes (Signed)
EMERGENCY DEPARTMENT AT [facility] Provider Note   CSN: [id number] Arrival date & time: 07/20/22  2028     History  Chief Complaint  Patient presents with   Hip Pain    Betty Henderson is a 62 y.o. female.   Hip Pain     Patient with medical history of schizoaffective disorder, chronic pain, malingering presents to the emergency department due to left hip pain.  She is not really able to specify how long it has been going on but states this is been a very chronic issue, denies any recent injuries or falls.  No fevers at home, no lower extremity calf pain or chest pain or shortness of breath.  She says she is try to gel which did not really help.  States she would like to see a shot of medicine to go home.  She states that her hip hurts more than normal because she is been walking on it has been cold outside.  Home Medications Prior to Admission medications   Medication Sig Start Date End Date Taking? Authorizing Provider  naproxen (NAPROSYN) 500 MG tablet Take 1 tablet (500 mg total) by mouth 2 (two) times daily. 03/28/22   Dione Booze, MD  ondansetron (ZOFRAN) 4 MG tablet Take 1 tablet (4 mg total) by mouth every 8 (eight) hours as needed for nausea or vomiting. 06/07/22   Darrick Grinder, PA-C  ranitidine (ZANTAC) 150 MG capsule Take 1 capsule (150 mg total) by mouth daily. Patient not taking: Reported on 03/20/2018 05/31/17 01/07/19  Lorre Nick, MD      Allergies    Other, Lemon oil, Proanthocyanidin, Strawberry extract, Adhesive  [tape], Cherry, and Wound dressing adhesive    Review of Systems   Review of Systems  Physical Exam Updated Vital Signs BP 127/65 (BP Location: Left Arm)   Pulse 66   Temp 97.9 F (36.6 C) (Oral)   Resp 17   Ht 5\' 6"  (1.676 m)   Wt 62.6 kg   SpO2 100%   BMI 22.28 kg/m  Physical Exam Vitals and nursing note reviewed. Exam conducted with a chaperone present.  Constitutional:      Appearance: Normal  appearance.  HENT:     Head: Normocephalic and atraumatic.  Eyes:     General: No scleral icterus.       Right eye: No discharge.        Left eye: No discharge.     Extraocular Movements: Extraocular movements intact.     Pupils: Pupils are equal, round, and reactive to light.  Cardiovascular:     Rate and Rhythm: Normal rate and regular rhythm.     Pulses: Normal pulses.     Heart sounds: Normal heart sounds.     No friction rub. No gallop.  Pulmonary:     Effort: Pulmonary effort is normal. No respiratory distress.     Breath sounds: Normal breath sounds.  Abdominal:     General: Abdomen is flat. Bowel sounds are normal. There is no distension.     Palpations: Abdomen is soft.     Tenderness: There is no abdominal tenderness.  Musculoskeletal:        General: Tenderness present.     Comments: Very mild tenderness over the left hip, no erythema or warmth, tolerates passive ROM.  No tenderness to the calf  Skin:    General: Skin is warm and dry.     Coloration: Skin is not jaundiced.  Neurological:  Mental Status: She is alert. Mental status is at baseline.     Coordination: Coordination normal.     ED Results / Procedures / Treatments   Labs (all labs ordered are listed, but only abnormal results are displayed) Labs Reviewed - No data to display  EKG None  Radiology No results found.  Procedures Procedures    Medications Ordered in ED Medications  ketorolac (TORADOL) 15 MG/ML injection 15 mg (has no administration in time range)    ED Course/ Medical Decision Making/ A&P                             Medical Decision Making Risk Prescription drug management.   Patient presents to the emergency department due to left hip pain. Per chart review this seems to be a very chronic issue and she has had imaging of the hip and pelvis on 06/13/2022, she denies any falls or injuries since then.  On exam hip is not erythematous or warm, she is afebrile tolerates  passive ROM, not consistent with a septic joint.  She is neurovascular intact no indication of ischemic limb based on evaluation.  Doubt DVT based on exam and history.  She is on any pulse and had negative imaging about a month ago so I do not think need to repeat any imaging today.  Will discharge with Toradol shot, return precaution discussed with patient who is stable for discharge at this time.        Final Clinical Impression(s) / ED Diagnoses Final diagnoses:  Left hip pain    Rx / DC Orders ED Discharge Orders     None         Theron Arista, Cordelia Poche 07/20/22 2040    Derwood Kaplan, MD 07/21/22 1304

## 2022-07-20 NOTE — ED Triage Notes (Signed)
Pt arrived via [facility] c/o left hip pain, 8/10 on pain scale. Chronic. Pt states that it is worse today, would like pain shot and to go home

## 2022-07-20 NOTE — Discharge Instructions (Signed)
You are seen today in the emergency department due to left hip pain.  You can take Tylenol Motrin as needed for pain.  Return to the ED for emergent conditions.  Follow-up with your primary if symptoms persist.

## 2022-07-21 ENCOUNTER — Other Ambulatory Visit: Payer: Self-pay

## 2022-07-21 ENCOUNTER — Emergency Department (HOSPITAL_COMMUNITY)
Admission: EM | Admit: 2022-07-21 | Discharge: 2022-07-22 | Discharge status: Against Medical Advice | Payer: Medicaid Other | Attending: Emergency Medicine | Admitting: Emergency Medicine

## 2022-07-21 ENCOUNTER — Encounter (HOSPITAL_COMMUNITY): Payer: Self-pay

## 2022-07-21 DIAGNOSIS — M2550 Pain in unspecified joint: Secondary | ICD-10-CM | POA: Insufficient documentation

## 2022-07-21 DIAGNOSIS — R350 Frequency of micturition: Secondary | ICD-10-CM | POA: Diagnosis present

## 2022-07-21 DIAGNOSIS — Z5321 Procedure and treatment not carried out due to patient leaving prior to being seen by health care provider: Secondary | ICD-10-CM | POA: Diagnosis not present

## 2022-07-21 LAB — CBG MONITORING, ED: Glucose-Capillary: 98 mg/dL (ref 70–99)

## 2022-07-21 NOTE — ED Triage Notes (Signed)
Pt arrived via [facility]  today stating that she is just sick not feeling good at all.

## 2022-07-21 NOTE — ED Provider Triage Note (Signed)
  Emergency Medicine Provider Triage Evaluation Note  MRN:  [id number]  Arrival date & time: 07/22/22    Medically screening exam initiated at 4:13 AM.   CC:   multiple complaints   HPI:  Betty Henderson is a 62 y.o. year-old female presents to the ED with chief complaint of urinary frequency and arthralgias.  Seen quite frequently for chronic problems.  38 visits in the last 6 months.  History provided by patient. ROS:  -As included in HPI PE:   Vitals:   07/21/22 2340  BP: 118/61  Pulse: 74  Temp: 98 F (36.7 C)  SpO2: 95%    Non-toxic appearing No respiratory distress  MDM:  Based on signs and symptoms, housing instability is highest on my differential, but due to new complaints of urinary frequency, will recheck labs. I've ordered labs in triage to expedite lab/diagnostic workup.  Patient was informed that the remainder of the evaluation will be completed by another provider, this initial triage assessment does not replace that evaluation, and the importance of remaining in the ED until their evaluation is complete.    Roxy Horseman, PA-C 07/22/22 [phone]

## 2022-07-22 LAB — CBC WITH DIFFERENTIAL/PLATELET
Abs Immature Granulocytes: 0.02 10*3/uL (ref 0.00–0.07)
Basophils Absolute: 0 10*3/uL (ref 0.0–0.1)
Basophils Relative: 1 %
Eosinophils Absolute: 0.3 10*3/uL (ref 0.0–0.5)
Eosinophils Relative: 5 %
HCT: 35.3 % — ABNORMAL LOW (ref 36.0–46.0)
Hemoglobin: 11.6 g/dL — ABNORMAL LOW (ref 12.0–15.0)
Immature Granulocytes: 0 %
Lymphocytes Relative: 38 %
Lymphs Abs: 2.2 10*3/uL (ref 0.7–4.0)
MCH: 30.2 pg (ref 26.0–34.0)
MCHC: 32.9 g/dL (ref 30.0–36.0)
MCV: 91.9 fL (ref 80.0–100.0)
Monocytes Absolute: 0.3 10*3/uL (ref 0.1–1.0)
Monocytes Relative: 6 %
Neutro Abs: 2.9 10*3/uL (ref 1.7–7.7)
Neutrophils Relative %: 50 %
Platelets: 345 10*3/uL (ref 150–400)
RBC: 3.84 MIL/uL — ABNORMAL LOW (ref 3.87–5.11)
RDW: 13.3 % (ref 11.5–15.5)
WBC: 5.7 10*3/uL (ref 4.0–10.5)
nRBC: 0 % (ref 0.0–0.2)

## 2022-07-22 LAB — BASIC METABOLIC PANEL
Anion gap: 9 (ref 5–15)
BUN: 18 mg/dL (ref 8–23)
CO2: 26 mmol/L (ref 22–32)
Calcium: 9.4 mg/dL (ref 8.9–10.3)
Chloride: 105 mmol/L (ref 98–111)
Creatinine, Ser: 0.92 mg/dL (ref 0.44–1.00)
GFR, Estimated: >60 mL/min (ref >60)
Glucose, Bld: 111 mg/dL — ABNORMAL HIGH (ref 70–99)
Potassium: 4.4 mmol/L (ref 3.5–5.1)
Sodium: 140 mmol/L (ref 135–145)

## 2022-07-22 NOTE — ED Notes (Signed)
Called pt twice to take back to hallway 11, no answer.

## 2022-08-09 ENCOUNTER — Encounter (HOSPITAL_COMMUNITY): Payer: Self-pay | Admitting: Emergency Medicine

## 2022-08-09 ENCOUNTER — Other Ambulatory Visit: Payer: Self-pay

## 2022-08-09 ENCOUNTER — Emergency Department (HOSPITAL_COMMUNITY)
Admission: EM | Admit: 2022-08-09 | Discharge: 2022-08-10 | Disposition: A | Discharge status: Home / Self Care | Payer: Medicaid Other | Attending: Emergency Medicine | Admitting: Emergency Medicine

## 2022-08-09 DIAGNOSIS — L299 Pruritus, unspecified: Secondary | ICD-10-CM | POA: Insufficient documentation

## 2022-08-09 DIAGNOSIS — Z5900 Homelessness unspecified: Secondary | ICD-10-CM | POA: Insufficient documentation

## 2022-08-09 DIAGNOSIS — J00 Acute nasopharyngitis [common cold]: Secondary | ICD-10-CM | POA: Diagnosis not present

## 2022-08-09 NOTE — ED Triage Notes (Signed)
Pt c/o itchiness to her back. Feels like she is getting a rash.

## 2022-08-10 NOTE — ED Provider Notes (Signed)
[place] EMERGENCY DEPARTMENT AT [facility] Provider Note   CSN: [id number] Arrival date & time: 08/09/22  2216     History  Chief Complaint  Patient presents with   Pruritis    Nechuma Faubion is a 62 y.o. female.  63 year old female with past medical history of schizoaffective disorder, anemia, arthritis, psychosis, malingering and osteoporosis presents with complaint of feeling cold tonight.  She denies vomiting, chills, cough, changes in bowel or bladder habits.  She would like some more warm blankets so that she can sleep.       Home Medications Prior to Admission medications   Medication Sig Start Date End Date Taking? Authorizing Provider  naproxen (NAPROSYN) 500 MG tablet Take 1 tablet (500 mg total) by mouth 2 (two) times daily. 03/28/22   Dione Booze, MD  ondansetron (ZOFRAN) 4 MG tablet Take 1 tablet (4 mg total) by mouth every 8 (eight) hours as needed for nausea or vomiting. 06/07/22   Darrick Grinder, PA-C  ranitidine (ZANTAC) 150 MG capsule Take 1 capsule (150 mg total) by mouth daily. Patient not taking: Reported on 03/20/2018 05/31/17 01/07/19  Lorre Nick, MD      Allergies    Other, Lemon oil, Proanthocyanidin, Strawberry extract, Adhesive  [tape], Cherry, and Wound dressing adhesive    Review of Systems   Review of Systems Negative except as per HPI Physical Exam Updated Vital Signs BP (!) 120/90   Pulse 78   Temp 97.8 F (36.6 C)   Resp 18   Ht 5\' 6"  (1.676 m)   Wt 62.6 kg   SpO2 100%   BMI 22.28 kg/m  Physical Exam Vitals and nursing note reviewed.  Constitutional:      General: She is not in acute distress.    Appearance: She is well-developed. She is not diaphoretic.  HENT:     Head: Normocephalic and atraumatic.  Cardiovascular:     Rate and Rhythm: Normal rate and regular rhythm.     Heart sounds: Normal heart sounds.  Pulmonary:     Effort: Pulmonary effort is normal.     Breath sounds: Normal breath sounds.   Skin:    General: Skin is warm and dry.     Findings: No erythema or rash.  Neurological:     Mental Status: She is alert and oriented to person, place, and time.  Psychiatric:        Behavior: Behavior normal.     ED Results / Procedures / Treatments   Labs (all labs ordered are listed, but only abnormal results are displayed) Labs Reviewed - No data to display  EKG None  Radiology No results found.  Procedures Procedures    Medications Ordered in ED Medications - No data to display  ED Course/ Medical Decision Making/ A&P                             Medical Decision Making  62 year old female presents the department with request for warm blankets and a place to sleep.  She denies acute complaints tonight.   Patient is provided with blankets, will plan to discharge.        Final Clinical Impression(s) / ED Diagnoses Final diagnoses:  Homeless    Rx / DC Orders ED Discharge Orders     None         Alden Hipp 08/10/22 0121    Tilden Fossa, MD 08/10/22 [phone]

## 2022-08-11 ENCOUNTER — Other Ambulatory Visit: Payer: Self-pay

## 2022-08-11 ENCOUNTER — Emergency Department (HOSPITAL_COMMUNITY)
Admission: EM | Admit: 2022-08-11 | Discharge: 2022-08-12 | Disposition: A | Discharge status: Home / Self Care | Payer: Medicaid Other | Attending: Emergency Medicine | Admitting: Emergency Medicine

## 2022-08-11 ENCOUNTER — Encounter (HOSPITAL_COMMUNITY): Payer: Self-pay

## 2022-08-11 DIAGNOSIS — M79671 Pain in right foot: Secondary | ICD-10-CM | POA: Diagnosis not present

## 2022-08-11 DIAGNOSIS — R109 Unspecified abdominal pain: Secondary | ICD-10-CM | POA: Insufficient documentation

## 2022-08-11 DIAGNOSIS — R1110 Vomiting, unspecified: Secondary | ICD-10-CM | POA: Insufficient documentation

## 2022-08-11 DIAGNOSIS — M79672 Pain in left foot: Secondary | ICD-10-CM | POA: Insufficient documentation

## 2022-08-11 MED ORDER — ACETAMINOPHEN 325 MG PO TABS
650.0000 mg | ORAL_TABLET | Freq: Once | ORAL | Status: AC
  Administered 2022-08-11: 650 mg via ORAL
  Filled 2022-08-11: qty 2

## 2022-08-11 NOTE — ED Triage Notes (Signed)
Pt. BIB [facility] from the [facility] for abdominal pain and vomiting for the last few hours.

## 2022-08-11 NOTE — ED Provider Notes (Signed)
[place] EMERGENCY DEPARTMENT AT [facility] Provider Note   CSN: [id number] Arrival date & time: 08/11/22  2124     History  Chief Complaint  Patient presents with   Abdominal Pain    Claudean Siemsen is a 62 y.o. female.  62 year old female brought in by EMS from the [facility] with complaint of pain in her feet. States that she has frost bite and no one will treat her feet. Per EMS note- reported abdominal pain and vomiting, she denies this and would like something to eat.         Home Medications Prior to Admission medications   Medication Sig Start Date End Date Taking? Authorizing Provider  naproxen (NAPROSYN) 500 MG tablet Take 1 tablet (500 mg total) by mouth 2 (two) times daily. 03/28/22   Dione Booze, MD  ondansetron (ZOFRAN) 4 MG tablet Take 1 tablet (4 mg total) by mouth every 8 (eight) hours as needed for nausea or vomiting. 06/07/22   Darrick Grinder, PA-C  ranitidine (ZANTAC) 150 MG capsule Take 1 capsule (150 mg total) by mouth daily. Patient not taking: Reported on 03/20/2018 05/31/17 01/07/19  Lorre Nick, MD      Allergies    Other, Lemon oil, Proanthocyanidin, Strawberry extract, Adhesive  [tape], Cherry, and Wound dressing adhesive    Review of Systems   Review of Systems Negative except as per HPI Physical Exam Updated Vital Signs BP (!) 124/95 (BP Location: Right Arm)   Pulse 84   Temp 97.9 F (36.6 C) (Oral)   Resp 16   Ht 5\' 6"  (1.676 m)   Wt 63.5 kg   SpO2 100%   BMI 22.60 kg/m  Physical Exam Vitals and nursing note reviewed.  Constitutional:      General: She is not in acute distress.    Appearance: She is well-developed. She is not diaphoretic.  HENT:     Head: Normocephalic and atraumatic.  Cardiovascular:     Rate and Rhythm: Normal rate and regular rhythm.     Pulses: Normal pulses.     Heart sounds: Normal heart sounds.  Pulmonary:     Effort: Pulmonary effort is normal.     Breath sounds: Normal breath sounds.   Abdominal:     Tenderness: There is no abdominal tenderness.  Musculoskeletal:        General: No swelling, tenderness, deformity or signs of injury.  Skin:    General: Skin is warm and dry.     Findings: No erythema or rash.  Neurological:     Mental Status: She is alert and oriented to person, place, and time.  Psychiatric:        Behavior: Behavior normal.     ED Results / Procedures / Treatments   Labs (all labs ordered are listed, but only abnormal results are displayed) Labs Reviewed - No data to display  EKG None  Radiology No results found.  Procedures Procedures    Medications Ordered in ED Medications  acetaminophen (TYLENOL) tablet 650 mg (has no administration in time range)    ED Course/ Medical Decision Making/ A&P                             Medical Decision Making Risk OTC drugs.   62 year old female brought in by EMS.  Per EMS note, patient is here with abdominal pain and vomiting.  She denies this and states that she would  like some to eat.  She reports that she is here because she has chronic pain in her feet after having had frostbite.  On exam, her feet are normal in appearance, DP pulses present, sensation intact, skin intact.  There is no swelling, deformity, tenderness or crepitus.  Plan is to provide Tylenol for patient's foot pain and discharge.  Recommend patient follow-up with her primary care provider.        Final Clinical Impression(s) / ED Diagnoses Final diagnoses:  Pain in both feet    Rx / DC Orders ED Discharge Orders     None         Alden Hipp 08/11/22 2331    Tilden Fossa, MD 08/12/22 0230

## 2022-08-11 NOTE — ED Notes (Signed)
Discharge instructions reviewed and pt states no questions. Coffee and crackers with PB provided upon discharge.

## 2022-08-11 NOTE — Discharge Instructions (Addendum)
Follow up with your doctor

## 2022-08-12 ENCOUNTER — Emergency Department (HOSPITAL_COMMUNITY)
Admission: EM | Admit: 2022-08-12 | Discharge: 2022-08-13 | Discharge status: Against Medical Advice | Payer: Medicaid Other | Attending: Emergency Medicine | Admitting: Emergency Medicine

## 2022-08-12 ENCOUNTER — Emergency Department (HOSPITAL_COMMUNITY): Payer: Medicaid Other

## 2022-08-12 ENCOUNTER — Other Ambulatory Visit: Payer: Self-pay

## 2022-08-12 ENCOUNTER — Encounter (HOSPITAL_COMMUNITY): Payer: Self-pay | Admitting: Emergency Medicine

## 2022-08-12 DIAGNOSIS — Z5321 Procedure and treatment not carried out due to patient leaving prior to being seen by health care provider: Secondary | ICD-10-CM | POA: Diagnosis not present

## 2022-08-12 DIAGNOSIS — M79672 Pain in left foot: Secondary | ICD-10-CM | POA: Insufficient documentation

## 2022-08-12 DIAGNOSIS — M79671 Pain in right foot: Secondary | ICD-10-CM | POA: Diagnosis present

## 2022-08-12 MED ORDER — IBUPROFEN 400 MG PO TABS: 600.0000 mg | ORAL_TABLET | Freq: Once | ORAL | Status: DC

## 2022-08-12 MED ORDER — ACETAMINOPHEN 500 MG PO TABS: 1000.0000 mg | ORAL_TABLET | Freq: Once | ORAL | Status: DC

## 2022-08-12 NOTE — ED Triage Notes (Signed)
Patient BIB [facility] from [facility] c/o lower back pain and bilateral leg pain from walking a lot recently.  Patient has history of same.   126/84 84 HR RR 14 98% RA 276 CBG

## 2022-08-12 NOTE — ED Notes (Signed)
Unable to sign MSE d/t signature pad being broken but patient agrees to MSE.

## 2022-08-12 NOTE — ED Provider Triage Note (Signed)
Emergency Medicine Provider Triage Evaluation Note  Betty Henderson , a 62 y.o. female  was evaluated in triage.  Pt complains of complaining of pain and soreness in her bilateral feet.  Patient was seen yesterday in the ED.  She reports also she has had a new cough.  Review of Systems  Positive: Myalgias, fatigue, coughing. Negative: Loss of consciousness, chest pain  Physical Exam  BP 109/60 (BP Location: Left Arm)   Pulse 75   Temp 98.1 F (36.7 C) (Oral)   Resp 16   Ht 5\' 6"  (1.676 m)   Wt 64 kg   SpO2 100%   BMI 22.77 kg/m  Gen:   Awake, no distress   Resp:  Normal effort, wet cough, no rhonchi MSK:   Moves extremities without difficulty    Medical Decision Making  Medically screening exam initiated at 10:00 PM.  Appropriate orders placed.  Ameelah Lacina was informed that the remainder of the evaluation will be completed by another provider, this initial triage assessment does not replace that evaluation, and the importance of remaining in the ED until their evaluation is complete.  Ibuprofen and Tylenol ordered for suspected myalgias.  X-rays are ordered for pneumonia screening given the patient's persistent cough, home status.  Vital signs are unremarkable on arrival.  No evidence of sepsis.  Feet are normal in appearance, also seen yesterday in the ED for similar complaint of foot pain.   Terald Sleeper, MD 08/12/22 2201

## 2022-08-13 NOTE — ED Notes (Signed)
I brought pt back to room and pt turned around and left. I asked the pt to follow me to the room and she responded "f*ck you" and left then left the hospital.

## 2022-08-17 ENCOUNTER — Other Ambulatory Visit: Payer: Self-pay

## 2022-08-17 ENCOUNTER — Emergency Department (HOSPITAL_COMMUNITY): Payer: Medicaid Other

## 2022-08-17 ENCOUNTER — Encounter (HOSPITAL_COMMUNITY): Payer: Self-pay | Admitting: Emergency Medicine

## 2022-08-17 ENCOUNTER — Emergency Department (HOSPITAL_COMMUNITY)
Admission: EM | Admit: 2022-08-17 | Discharge: 2022-08-22 | Disposition: A | Discharge status: Home / Self Care | Payer: Medicaid Other | Attending: Emergency Medicine | Admitting: Emergency Medicine

## 2022-08-17 DIAGNOSIS — F17210 Nicotine dependence, cigarettes, uncomplicated: Secondary | ICD-10-CM | POA: Diagnosis not present

## 2022-08-17 DIAGNOSIS — R443 Hallucinations, unspecified: Secondary | ICD-10-CM | POA: Diagnosis not present

## 2022-08-17 DIAGNOSIS — M791 Myalgia, unspecified site: Secondary | ICD-10-CM | POA: Insufficient documentation

## 2022-08-17 DIAGNOSIS — F259 Schizoaffective disorder, unspecified: Secondary | ICD-10-CM | POA: Diagnosis present

## 2022-08-17 DIAGNOSIS — F250 Schizoaffective disorder, bipolar type: Secondary | ICD-10-CM | POA: Diagnosis not present

## 2022-08-17 DIAGNOSIS — F29 Unspecified psychosis not due to a substance or known physiological condition: Secondary | ICD-10-CM | POA: Insufficient documentation

## 2022-08-17 LAB — CBC WITH DIFFERENTIAL/PLATELET
Abs Immature Granulocytes: 0.01 10*3/uL (ref 0.00–0.07)
Basophils Absolute: 0 10*3/uL (ref 0.0–0.1)
Basophils Relative: 1 %
Eosinophils Absolute: 0.2 10*3/uL (ref 0.0–0.5)
Eosinophils Relative: 4 %
HCT: 34.6 % — ABNORMAL LOW (ref 36.0–46.0)
Hemoglobin: 10.9 g/dL — ABNORMAL LOW (ref 12.0–15.0)
Immature Granulocytes: 0 %
Lymphocytes Relative: 39 %
Lymphs Abs: 1.9 10*3/uL (ref 0.7–4.0)
MCH: 30.3 pg (ref 26.0–34.0)
MCHC: 31.5 g/dL (ref 30.0–36.0)
MCV: 96.1 fL (ref 80.0–100.0)
Monocytes Absolute: 0.5 10*3/uL (ref 0.1–1.0)
Monocytes Relative: 9 %
Neutro Abs: 2.3 10*3/uL (ref 1.7–7.7)
Neutrophils Relative %: 47 %
Platelets: 319 10*3/uL (ref 150–400)
RBC: 3.6 MIL/uL — ABNORMAL LOW (ref 3.87–5.11)
RDW: 14.1 % (ref 11.5–15.5)
WBC: 4.9 10*3/uL (ref 4.0–10.5)
nRBC: 0 % (ref 0.0–0.2)

## 2022-08-17 LAB — URINALYSIS, ROUTINE W REFLEX MICROSCOPIC
Bacteria, UA: NONE SEEN
Bilirubin Urine: NEGATIVE
Glucose, UA: NEGATIVE mg/dL
Hgb urine dipstick: NEGATIVE
Ketones, ur: NEGATIVE mg/dL
Nitrite: NEGATIVE
Protein, ur: NEGATIVE mg/dL
Specific Gravity, Urine: 1.008 (ref 1.005–1.030)
pH: 7 (ref 5.0–8.0)

## 2022-08-17 LAB — RAPID URINE DRUG SCREEN, HOSP PERFORMED
Amphetamines: NOT DETECTED
Barbiturates: NOT DETECTED
Benzodiazepines: NOT DETECTED
Cocaine: NOT DETECTED
Opiates: NOT DETECTED
Tetrahydrocannabinol: NOT DETECTED

## 2022-08-17 LAB — COMPREHENSIVE METABOLIC PANEL
ALT: 11 U/L (ref 0–44)
AST: 14 U/L — ABNORMAL LOW (ref 15–41)
Albumin: 3.4 g/dL — ABNORMAL LOW (ref 3.5–5.0)
Alkaline Phosphatase: 47 U/L (ref 38–126)
Anion gap: 9 (ref 5–15)
BUN: 6 mg/dL — ABNORMAL LOW (ref 8–23)
CO2: 23 mmol/L (ref 22–32)
Calcium: 8.9 mg/dL (ref 8.9–10.3)
Chloride: 105 mmol/L (ref 98–111)
Creatinine, Ser: 0.72 mg/dL (ref 0.44–1.00)
GFR, Estimated: >60 mL/min (ref >60)
Glucose, Bld: 86 mg/dL (ref 70–99)
Potassium: 3.8 mmol/L (ref 3.5–5.1)
Sodium: 137 mmol/L (ref 135–145)
Total Bilirubin: 0.5 mg/dL (ref 0.3–1.2)
Total Protein: 6.5 g/dL (ref 6.5–8.1)

## 2022-08-17 LAB — ETHANOL: Alcohol, Ethyl (B): <10 mg/dL (ref <10)

## 2022-08-17 LAB — AMMONIA: Ammonia: 33 umol/L (ref 9–35)

## 2022-08-17 LAB — CK: Total CK: 142 U/L (ref 38–234)

## 2022-08-17 MED ORDER — OLANZAPINE 5 MG PO TABS
5.0000 mg | ORAL_TABLET | Freq: Two times a day (BID) | ORAL | Status: DC
  Administered 2022-08-18 – 2022-08-19 (×3): 5 mg via ORAL
  Filled 2022-08-17 (×3): qty 1

## 2022-08-17 NOTE — ED Provider Notes (Addendum)
[place] EMERGENCY DEPARTMENT AT [facility] Provider Note   CSN: [id number] Arrival date & time: 08/17/22  1449     History Chief Complaint  Patient presents with   Generalized Body Aches    Talayla Westby is a 62 y.o. female with history of malingering, psychosis, and schizoaffective disorder bipolar type presents to the emerged from today for evaluation of blisters on her feet.  Patient called EMS.  She reports that she has been walking around barefoot and thinks that she may have blisters on her feet.  Exam is limited due to patient's tangential speech.  It is hard to keep her focused on the problem at hand.  Per the triage note, she mentioned that she needed to change her jobs because she was pregnant with triplets.  Unable to find any current medication that she is on for her schizoaffective.  She denies any chest pain, shortness breath, or any fevers.  Denies any suicidal homicidal ideations.  She reports that she has some hallucinations however is unable to further explain these.  Level 5 caveat due to patient's psychosis.  HPI     Home Medications Prior to Admission medications   Medication Sig Start Date End Date Taking? Authorizing Provider  naproxen (NAPROSYN) 500 MG tablet Take 1 tablet (500 mg total) by mouth 2 (two) times daily. 03/28/22   Dione Booze, MD  ondansetron (ZOFRAN) 4 MG tablet Take 1 tablet (4 mg total) by mouth every 8 (eight) hours as needed for nausea or vomiting. 06/07/22   Darrick Grinder, PA-C  ranitidine (ZANTAC) 150 MG capsule Take 1 capsule (150 mg total) by mouth daily. Patient not taking: Reported on 03/20/2018 05/31/17 01/07/19  Lorre Nick, MD      Allergies    Other, Lemon oil, Proanthocyanidin, Strawberry extract, Adhesive  [tape], Cherry, and Wound dressing adhesive    Review of Systems   Review of Systems  Unable to perform ROS: Psychiatric disorder    Physical Exam Updated Vital Signs BP 117/65 (BP Location: Left  Arm)   Pulse (!) 59   Temp 98.2 F (36.8 C) (Oral)   Resp 19   SpO2 100%  Physical Exam Vitals and nursing note reviewed.  Constitutional:      General: She is not in acute distress.    Appearance: Normal appearance. She is not toxic-appearing.  Eyes:     General: No scleral icterus. Pulmonary:     Effort: Pulmonary effort is normal. No respiratory distress.  Musculoskeletal:     Cervical back: Normal range of motion. No rigidity.  Skin:    General: Skin is dry.     Findings: No rash.     Comments: I do not appreciate any blisters on the patient's plantar aspect of the bilateral feet or dorsal aspect.  No skin breakdown or blisters or lesions or wounds seen in between the toes either.  She has palpable pulses both DP and PT.  Brisk cap refill.  She has unkempt toenails.  Compartments are soft.  Reported sensations intact.  She does have some thickened callus seen on the bottoms of her bilateral feet.  Without cracks.  Neurological:     General: No focal deficit present.     Mental Status: She is alert.     Comments: Patient alert but does not follow direction well.  She is moving all extremities.  No focal deficit.  Answering questions with tangential speech, however is clear. No facial droop appreciated.  She is  oriented to herself but gives a different last name.  She is oriented to place.  She reports that its May 2025 for time.  Psychiatric:        Mood and Affect: Mood normal.     ED Results / Procedures / Treatments   Labs (all labs ordered are listed, but only abnormal results are displayed) Labs Reviewed  COMPREHENSIVE METABOLIC PANEL - Abnormal; Notable for the following components:      Result Value   BUN 6 (*)    Albumin 3.4 (*)    AST 14 (*)    All other components within normal limits  CBC WITH DIFFERENTIAL/PLATELET - Abnormal; Notable for the following components:   RBC 3.60 (*)    Hemoglobin 10.9 (*)    HCT 34.6 (*)    All other components within normal  limits  URINALYSIS, ROUTINE W REFLEX MICROSCOPIC - Abnormal; Notable for the following components:   Leukocytes,Ua TRACE (*)    All other components within normal limits  ETHANOL  RAPID URINE DRUG SCREEN, HOSP PERFORMED  AMMONIA  CK    EKG None  Radiology CT Head Wo Contrast  Result Date: 08/17/2022 CLINICAL DATA:  Mental status change, unknown cause EXAM: CT HEAD WITHOUT CONTRAST TECHNIQUE: Contiguous axial images were obtained from the base of the skull through the vertex without intravenous contrast. RADIATION DOSE REDUCTION: This exam was performed according to the departmental dose-optimization program which includes automated exposure control, adjustment of the mA and/or kV according to patient size and/or use of iterative reconstruction technique. COMPARISON:  None Available. FINDINGS: Brain: No acute intracranial abnormality. Specifically, no hemorrhage, hydrocephalus, mass lesion, acute infarction, or significant intracranial injury. Vascular: No hyperdense vessel or unexpected calcification. Skull: No acute calvarial abnormality. Sinuses/Orbits: No acute findings Other: None IMPRESSION: Normal study. Electronically Signed   By: Charlett Nose M.D.   On: 08/17/2022 18:34    Procedures Procedures   Medications Ordered in ED Medications - No data to display  ED Course/ Medical Decision Making/ A&P Clinical Course as of 08/17/22 2039  Sat Aug 17, 2022  1901 CT Head Wo Contrast [RR]    Clinical Course User Index [RR] Achille Rich, PA-C                           Medical Decision Making Amount and/or Complexity of Data Reviewed Labs: ordered. Radiology: ordered. Decision-making details documented in ED Course.   62 y.o. female presents to the ER for evaluation of blisters on feet and psych. Differential diagnosis includes but is not limited to SJS, TENS, friction blisters, psych. Vital signs mild bradycardia otherwise unremarkable. Physical exam as noted above.   Patient  does have history of schizoaffective bipolar type and psychosis.  I do not have any recent psych notes for her.  Appears that she frequents the ER often with 35 visits in the past 6 months.  Usually involving her homelessness and bodyaches.  Will order medical clearance labs and CK given her diffuse body aches.  I do not appreciate any focal deficit.  She is a guarded historian given her psychosis.  It is difficult to have her follow simple commands for neurological exam so is difficult for exam.  Will order a CT of her head and labs.  I independently reviewed and interpreted the patient's labs.  CBC shows hemoglobin 10.9.  Appears around patient's baseline anemia.  No leukocytosis.  CMP shows mildly decreased albumin and AST.  BUN  is 6.  Otherwise, no electrolyte or LFT abnormality.  CK and ammonia within normal limits.  Ethanol less than 10.  Urinalysis shows trace amount of leukocytes but no white blood cells bacteria or nitrites.  UDS is negative as well.  CT image of the head is unremarkable.  At this time, patient is medically clear for psych evaluation.  She is here voluntarily, do not see any need for IVC at this time.  Portions of this report may have been transcribed using voice recognition software. Every effort was made to ensure accuracy; however, inadvertent computerized transcription errors may be present.   Final Clinical Impression(s) / ED Diagnoses Final diagnoses:  None    Rx / DC Orders ED Discharge Orders     None         Achille Rich, PA-C 08/17/22 2043    Achille Rich, PA-C 08/17/22 2043    Elayne Snare K, [place] 08/17/22 2245

## 2022-08-17 NOTE — ED Triage Notes (Signed)
Pt BIB [facility] with reports of body aches. Pt unable to give me a reason why she called 911. Pt stating "I need to change my career, I'm having triplets in a month."

## 2022-08-17 NOTE — Consult Note (Signed)
[facility] Consult Note  Patient Name: Betty Henderson MRN: [id number] DOB: 05/07/1960 DATE OF Consult: 08/17/2022  PRIMARY PSYCHIATRIC DIAGNOSES  1.  Schizoaffective disorder   RECOMMENDATIONS  Recommendations: Medication recommendations: Zyprexa 5 mg bid for psychosis Non-Medication/therapeutic recommendations: EKG to assess Qtc treat if elevated Is inpatient psychiatric hospitalization recommended for this patient? Yes (Explain why): patient is acutely psychotic From a psychiatric perspective, is this patient appropriate for discharge to an outpatient setting/resource or other less restrictive environment for continued care?  No (Explain why): patient is currently unable to care for herself Follow-Up Telepsychiatry C/L services: We will continue to follow this patient with you until stabilized or discharged.  If you have any questions or concerns, please call our TeleCare Coordination service at  [phone] and ask for myself or the provider on-call. Communication: Treatment team members (and family members if applicable) who were involved in treatment/care discussions and planning, and with whom we spoke or engaged with via secure text/chat, include the following: Victory Dakin, [facility]  Thank you for involving [place] in the care of this patient. If you have any additional questions or concerns, please call [phone] and ask for me or the provider on-call.  TELEPSYCHIATRY ATTESTATION & CONSENT  As the provider for this telehealth consult, I attest that I verified the patient's identity using two separate identifiers, introduced myself to the patient, provided my credentials, disclosed my location, and performed this encounter via a HIPAA-compliant, real-time, face-to-face, two-way, interactive audio and video platform and with the full consent and agreement of the patient (or guardian as applicable.)  Patient physical location: ED [place] . Telehealth provider physical location: home  office in state of [place].  Video start time: 2135 [facility] Time) Video end time: 2145 [facility] Time)  IDENTIFYING DATA  Betty Henderson is a 62 y.o. year-old female for whom a psychiatric consultation has been ordered by the primary provider. The patient was identified using two separate identifiers.  CHIEF COMPLAINT/REASON FOR CONSULT  Came in for c/o blisters on her feet  HISTORY OF PRESENT ILLNESS (HPI)  The patient was found by the ED provider to be very tangential and not her typical "malingering" behavior.  Per chart review that patient is seen very frequently many varied complaints. She is homeless and frequently is looking for a place to sleep. Despite her history of Schizoaffective it does not appear that she is seen for mental health reasons very often, none in the past 2 years.   It was very difficult to interview the patient as she was very tangential and did not make much sense. She did deny SI,HI. Unsure about AVH. She denied any medications. Otherwise she could not give any history.   PAST PSYCHIATRIC HISTORY  Schizoaffective Otherwise as per HPI above.  PAST MEDICAL HISTORY  Past Medical History:  Diagnosis Date   Anemia    Arthritis    Chronic pain    Drug-seeking behavior    Malingering    Osteopetrosis    Psychosis (HCC)    Schizoaffective disorder, bipolar type (HCC)      HOME MEDICATIONS  PTA Medications  Medication Sig   ondansetron (ZOFRAN) 4 MG tablet Take 1 tablet (4 mg total) by mouth every 8 (eight) hours as needed for nausea or vomiting. (Patient not taking: Reported on 08/17/2022)     ALLERGIES  Allergies  Allergen Reactions   Other Itching    PT reports having environmental allergies which she used to receive shots for, but cannot specify  exact allergens   Lemon Oil Rash   Proanthocyanidin Rash   Strawberry Extract Rash   Adhesive  [Tape] Rash   Cherry Rash   Wound Dressing Adhesive Rash    SOCIAL & SUBSTANCE USE HISTORY  Social  History   Socioeconomic History   Marital status: Single    Spouse name: Not on file   Number of children: Not on file   Years of education: Not on file   Highest education level: Not on file  Occupational History   Not on file  Tobacco Use   Smoking status: Every Day    Packs/day: .5    Types: Cigarettes   Smokeless tobacco: Never  Vaping Use   Vaping Use: Never used  Substance and Sexual Activity   Alcohol use: Not Currently    Comment: 1 cocktail 3 weeks ago   Drug use: Not Currently    Types: Cocaine    Comment: states "it's legal though"   Sexual activity: Never  Other Topics Concern   Not on file  Social History Narrative   Not on file   Social Determinants of Health   Financial Resource Strain: Not on file  Food Insecurity: Not on file  Transportation Needs: Not on file  Physical Activity: Not on file  Stress: Not on file  Social Connections: Not on file   Social History   Tobacco Use  Smoking Status Every Day   Packs/day: .5   Types: Cigarettes  Smokeless Tobacco Never   Social History   Substance and Sexual Activity  Alcohol Use Not Currently   Comment: 1 cocktail 3 weeks ago   Social History   Substance and Sexual Activity  Drug Use Not Currently   Types: Cocaine   Comment: states "it's legal though"  .  FAMILY HISTORY  Family History  Family history unknown: Yes   Family Psychiatric History (if known):  unknown  MENTAL STATUS EXAM (MSE)  Presentation  General Appearance:  Appropriate for Environment  Eye Contact: Poor  Speech: Garbled; Slurred; Other (comment) (very difficult to understand)  Speech Volume: Normal  Handedness:No data recorded  Mood and Affect  Mood: Euthymic  Affect: Blunt   Thought Process  Thought Processes: Disorganized  Descriptions of Associations: Tangential  Orientation: Partial  Thought Content: Illogical  History of Schizophrenia/Schizoaffective disorder:No data recorded Duration  of Psychotic Symptoms:No data recorded Hallucinations: Hallucinations: Other (comment) (unable to assess)  Ideas of Reference: Other (comment) (unable to assess due to her fragmented speech)  Suicidal Thoughts: Suicidal Thoughts: No  Homicidal Thoughts: Homicidal Thoughts: No   Sensorium  Memory: Recent Poor  Judgment: Impaired  Insight: Poor   Executive Functions  Concentration: Poor  Attention Span: Poor  Recall: Poor  Fund of Knowledge: Poor  Language: Poor   Psychomotor Activity  Psychomotor Activity: Psychomotor Activity: Normal   Assets  Assets: Other (comment) (unable to assess)   Sleep  Sleep: Sleep: -- (unable to assess)   VITALS  Blood pressure 116/62, pulse (!) 56, temperature 98.2 F (36.8 C), temperature source Oral, resp. rate 15, SpO2 100 %.  LABS  Admission on 08/17/2022  Component Date Value Ref Range Status   Sodium 08/17/2022 137  135 - 145 mmol/L Final   Potassium 08/17/2022 3.8  3.5 - 5.1 mmol/L Final   Chloride 08/17/2022 105  98 - 111 mmol/L Final   CO2 08/17/2022 23  22 - 32 mmol/L Final   Glucose, Bld 08/17/2022 86  70 - 99 mg/dL Final  Glucose reference range applies only to samples taken after fasting for at least 8 hours.   BUN 08/17/2022 6 (L)  8 - 23 mg/dL Final   Creatinine, Ser 08/17/2022 0.72  0.44 - 1.00 mg/dL Final   Calcium 16/12/9602 8.9  8.9 - 10.3 mg/dL Final   Total Protein 54/11/8117 6.5  6.5 - 8.1 g/dL Final   Albumin 14/78/2956 3.4 (L)  3.5 - 5.0 g/dL Final   AST 21/30/8657 14 (L)  15 - 41 U/L Final   ALT 08/17/2022 11  0 - 44 U/L Final   Alkaline Phosphatase 08/17/2022 47  38 - 126 U/L Final   Total Bilirubin 08/17/2022 0.5  0.3 - 1.2 mg/dL Final   GFR, Estimated 08/17/2022 >60  >60 mL/min Final   Comment: (NOTE) Calculated using the CKD-EPI Creatinine Equation (2021)    Anion gap 08/17/2022 9  5 - 15 Final   Performed at [facility], [address] [place], [place] [address]    Alcohol, Ethyl (B) 08/17/2022 <10  <10 mg/dL Final   Comment: (NOTE) Lowest detectable limit for serum alcohol is 10 mg/dL.  For medical purposes only. Performed at [facility], [address], [place], [place] [address]    Opiates 08/17/2022 NONE DETECTED  NONE DETECTED Final   Cocaine 08/17/2022 NONE DETECTED  NONE DETECTED Final   Benzodiazepines 08/17/2022 NONE DETECTED  NONE DETECTED Final   Amphetamines 08/17/2022 NONE DETECTED  NONE DETECTED Final   Tetrahydrocannabinol 08/17/2022 NONE DETECTED  NONE DETECTED Final   Barbiturates 08/17/2022 NONE DETECTED  NONE DETECTED Final   Comment: (NOTE) DRUG SCREEN FOR MEDICAL PURPOSES ONLY.  IF CONFIRMATION IS NEEDED FOR ANY PURPOSE, NOTIFY LAB WITHIN 5 DAYS.  LOWEST DETECTABLE LIMITS FOR URINE DRUG SCREEN Drug Class                     Cutoff (ng/mL) Amphetamine and metabolites    1000 Barbiturate and metabolites    200 Benzodiazepine                 200 Opiates and metabolites        300 Cocaine and metabolites        300 THC                            50 Performed at [facility], [address] [place], [place] [address]    WBC 08/17/2022 4.9  4.0 - 10.5 K/uL Final   RBC 08/17/2022 3.60 (L)  3.87 - 5.11 MIL/uL Final   Hemoglobin 08/17/2022 10.9 (L)  12.0 - 15.0 g/dL Final   HCT 40/12/2723 34.6 (L)  36.0 - 46.0 % Final   MCV 08/17/2022 96.1  80.0 - 100.0 fL Final   MCH 08/17/2022 30.3  26.0 - 34.0 pg Final   MCHC 08/17/2022 31.5  30.0 - 36.0 g/dL Final   RDW 36/64/4034 14.1  11.5 - 15.5 % Final   Platelets 08/17/2022 319  150 - 400 K/uL Final   REPEATED TO VERIFY   nRBC 08/17/2022 0.0  0.0 - 0.2 % Final   Neutrophils Relative % 08/17/2022 47  % Final   Neutro Abs 08/17/2022 2.3  1.7 - 7.7 K/uL Final   Lymphocytes Relative 08/17/2022 39  % Final   Lymphs Abs 08/17/2022 1.9  0.7 - 4.0 K/uL Final   Monocytes Relative 08/17/2022 9  % Final   Monocytes Absolute 08/17/2022  0.5  0.1 - 1.0 K/uL Final    Eosinophils Relative 08/17/2022 4  % Final   Eosinophils Absolute 08/17/2022 0.2  0.0 - 0.5 K/uL Final   Basophils Relative 08/17/2022 1  % Final   Basophils Absolute 08/17/2022 0.0  0.0 - 0.1 K/uL Final   Immature Granulocytes 08/17/2022 0  % Final   Abs Immature Granulocytes 08/17/2022 0.01  0.00 - 0.07 K/uL Final   Performed at [facility], [address], [place], [place] [address]   Color, Urine 08/17/2022 YELLOW  YELLOW Final   APPearance 08/17/2022 CLEAR  CLEAR Final   Specific Gravity, Urine 08/17/2022 1.008  1.005 - 1.030 Final   pH 08/17/2022 7.0  5.0 - 8.0 Final   Glucose, UA 08/17/2022 NEGATIVE  NEGATIVE mg/dL Final   Hgb urine dipstick 08/17/2022 NEGATIVE  NEGATIVE Final   Bilirubin Urine 08/17/2022 NEGATIVE  NEGATIVE Final   Ketones, ur 08/17/2022 NEGATIVE  NEGATIVE mg/dL Final   Protein, ur 60/45/4098 NEGATIVE  NEGATIVE mg/dL Final   Nitrite 11/91/4782 NEGATIVE  NEGATIVE Final   Leukocytes,Ua 08/17/2022 TRACE (A)  NEGATIVE Final   RBC / HPF 08/17/2022 0-5  0 - 5 RBC/hpf Final   WBC, UA 08/17/2022 0-5  0 - 5 WBC/hpf Final   Bacteria, UA 08/17/2022 NONE SEEN  NONE SEEN Final   Squamous Epithelial / HPF 08/17/2022 0-5  0 - 5 /HPF Final   Mucus 08/17/2022 PRESENT   Final   Performed at [facility], [address] [place], [place] [address]   Ammonia 08/17/2022 33  9 - 35 umol/L Final   Performed at [facility], [address] [place], [place] [address]   Total CK 08/17/2022 142  38 - 234 U/L Final   Performed at [facility], [address] [place], [place] [address]    PSYCHIATRIC REVIEW OF SYSTEMS (ROS)  ROS: Notable for the following relevant positive findings: ROS  Additional findings:      Musculoskeletal: No abnormal movements observed      Gait & Station: Laying/Sitting      Pain Screening: Denies      Nutrition & Dental Concerns:  none  RISK FORMULATION/ASSESSMENT  Is the patient experiencing any suicidal or homicidal ideations:  No  Protective factors considered for safety management: patient engage to identify any  Risk factors/concerns considered for safety management:  Age over 68  Is there a safety management plan with the patient and treatment team to minimize risk factors and promote protective factors: Yes           Explain: monitor in the ED Is crisis care placement or psychiatric hospitalization recommended: Yes     Based on my current evaluation and risk assessment, patient is determined at this time to be at:  High risk  *RISK ASSESSMENT Risk assessment is a dynamic process; it is possible that this patient's condition, and risk level, may change. This should be re-evaluated and managed over time as appropriate. Please re-consult psychiatric consult services if additional assistance is needed in terms of risk assessment and management. If your team decides to discharge this patient, please advise the patient how to best access emergency psychiatric services, or to call 911, if their condition worsens or they feel unsafe in any way.   Koren Shiver, NP Telepsychiatry Consult Services

## 2022-08-17 NOTE — BH Assessment (Signed)
Received message from Marijean Bravo with [facility]: "I had to move the time and provider for this patient. The new time is at 10:15 with Jacquiline Doe. Thank you!"   Harlin Rain Patsy [place], [facility], [facility] Triage Specialist [phone]

## 2022-08-17 NOTE — BH Assessment (Signed)
Spoke to Marijean Bravo, coordinator with [facility], and Dr Valinda Hoar Heh is scheduled to see Pt on tele-cart 1 at 2130. Created secure chat with involved providers and RN.   Pamalee Leyden, [facility], [facility] Triage Specialist [phone]

## 2022-08-18 MED ORDER — OLANZAPINE 5 MG PO TBDP
5.0000 mg | ORAL_TABLET | Freq: Three times a day (TID) | ORAL | Status: DC | PRN
  Administered 2022-08-20 – 2022-08-21 (×3): 5 mg via ORAL
  Filled 2022-08-18 (×3): qty 1

## 2022-08-18 MED ORDER — ZIPRASIDONE MESYLATE 20 MG IM SOLR
INTRAMUSCULAR | Status: AC
  Administered 2022-08-18: 10 mg via INTRAMUSCULAR
  Filled 2022-08-18: qty 20

## 2022-08-18 MED ORDER — ZIPRASIDONE MESYLATE 20 MG IM SOLR: 20.0000 mg | INTRAMUSCULAR | Status: DC | PRN

## 2022-08-18 MED ORDER — STERILE WATER FOR INJECTION IJ SOLN
INTRAMUSCULAR | Status: AC
  Administered 2022-08-18: 1 mL
  Filled 2022-08-18: qty 10

## 2022-08-18 MED ORDER — OLANZAPINE 5 MG PO TBDP: 5.0000 mg | ORAL_TABLET | Freq: Three times a day (TID) | ORAL | Status: DC | PRN

## 2022-08-18 MED ORDER — ZIPRASIDONE MESYLATE 20 MG IM SOLR: 10.0000 mg | Freq: Once | INTRAMUSCULAR | Status: AC

## 2022-08-18 MED ORDER — LORAZEPAM 1 MG PO TABS
1.0000 mg | ORAL_TABLET | ORAL | Status: AC | PRN
  Administered 2022-08-19: 1 mg via ORAL
  Filled 2022-08-18: qty 1

## 2022-08-18 MED ORDER — LORAZEPAM 1 MG PO TABS: 1.0000 mg | ORAL_TABLET | ORAL | Status: DC | PRN

## 2022-08-18 MED ORDER — ZIPRASIDONE MESYLATE 20 MG IM SOLR
20.0000 mg | Freq: Two times a day (BID) | INTRAMUSCULAR | Status: DC | PRN
  Administered 2022-08-18 – 2022-08-21 (×4): 20 mg via INTRAMUSCULAR
  Filled 2022-08-18 (×4): qty 20

## 2022-08-18 NOTE — ED Provider Notes (Signed)
Patient with h/o of schizoaffective disorder, bipoar type and psychosis who is here for acute decompensated psychosis not on home medications was here pending treatment. initially voluntary but now patient has become violent, nonredirectable and threatening towards staff. Will medicate and IVC, first exam completed. Will need reassessment for mental status when she wakes up.   Gill Delrossi, Barbara Cower, MD 08/18/22 [phone]

## 2022-08-18 NOTE — ED Notes (Signed)
Patient has been ivc'd ceira finished paperwork via Tj.

## 2022-08-18 NOTE — ED Notes (Signed)
Patient out of bed.  Walked to door of room and attempt to shut door.  Sitter stopped patient.  Patient became upset because she "was using the bathroom."  Pt noted to be squatting over trash can.  Patient informed that the trash can was not a toilet.  Patient continued to urinate in trash can.  Was directed back to bed

## 2022-08-18 NOTE — ED Notes (Signed)
Patient was still in original clothing when RN arrived to unit on night shift; No belongings found in locker room nor documented; Security advised that once patient is fully awake and able to follow commands staff will obtain clothing and patient will be wanded for safety; Pt woke briefly after Geodone being administered to eat dinner tray; Pt took night meds with no issues; RN will monitor when it's appropriate to approach patient regarding changing into purple scrubs due to pass aggressive behaviors-Monique,RN

## 2022-08-18 NOTE — ED Notes (Signed)
Patient came out of room and had all personal clothing on.  Attempted to redirect patient back to room and patient walked towards exit of department.  Multiple staff members stopped patient and patient was directed back to room by security.  Patient screaming and cursing.  Speech is very hard to understand.  ED MD aware and verbal order given for medication.  Plan is to IVC patient

## 2022-08-18 NOTE — Progress Notes (Signed)
Patient meets criteria for gero-psychiatric placement. The patient meets [facility] inpatient criteria per Jacquiline Doe, NP. Patient has been faxed out to the following facilities:   [facility]  [address] [place] [address] [phone]  [facility]  [address] [place] [address] [phone]  [facility]  [address] [place], [place] [address] [phone]  [facility]  [address] [place] [address] [phone]  [facility]  [address] [place], [place] [address] [phone]  [facility]  [address] [place] [address] [phone]  [facility]  [address] [place] [address] [phone]  [facility]  [address] [place] [address] [phone]  [facility]  [address] [place] [address] [phone]  [facility]  [address] [place] [address] [phone]  [facility]  [address], [place] [address] [phone]  [facility]  [address] [place], [place] [address] [phone]  [facility]  [address], [place] [address] [phone]  [facility]  [address] [place], [place] [address] [phone]  [facility]  [address] [place] [address] [phone]   Damita Dunnings, MSW, LCSW-A  11:09 AM 08/18/2022

## 2022-08-18 NOTE — ED Notes (Signed)
Patient has been wanded by Security-Monique,RN  

## 2022-08-18 NOTE — Consult Note (Signed)
  Patient seen and assessed by [facility], Betty Doe, NP and recommended for IP treatment. See note below   The patient was found by the ED provider to be very tangential and not her typical "malingering" behavior.  Per chart review that patient is seen very frequently many varied complaints. She is homeless and frequently is looking for a place to sleep. Despite her history of Schizoaffective it does not appear that she is seen for mental health reasons very often, none in the past 2 years.   It was very difficult to interview the patient as she was very tangential and did not make much sense. She did deny SI,HI. Unsure about AVH. She denied any medications. Otherwise she could not give any history.   Today patient remains, agitated, aggressive, and disorganized. When this provider attempted to assess patient she was sleeping after receiving PRN IM medications for aggression, and staff requested she continue to sleep.   Recommendation remains the same, continue to recommend inpatient psychiatric treatment and IVC at this time.

## 2022-08-19 DIAGNOSIS — F250 Schizoaffective disorder, bipolar type: Secondary | ICD-10-CM | POA: Diagnosis present

## 2022-08-19 MED ORDER — OLANZAPINE 5 MG PO TABS
5.0000 mg | ORAL_TABLET | Freq: Every day | ORAL | Status: DC
  Administered 2022-08-20 – 2022-08-22 (×3): 5 mg via ORAL
  Filled 2022-08-19 (×3): qty 1

## 2022-08-19 MED ORDER — STERILE WATER FOR INJECTION IJ SOLN
INTRAMUSCULAR | Status: AC
  Administered 2022-08-19: 1.2 mL
  Filled 2022-08-19: qty 10

## 2022-08-19 MED ORDER — OLANZAPINE 10 MG PO TABS
10.0000 mg | ORAL_TABLET | Freq: Every day | ORAL | Status: DC
  Administered 2022-08-19 – 2022-08-21 (×3): 10 mg via ORAL
  Filled 2022-08-19 (×4): qty 1

## 2022-08-19 NOTE — ED Provider Notes (Signed)
Emergency Medicine Observation Re-evaluation Note  Betty Henderson is a 62 y.o. female, seen on rounds today.  Pt initially presented to the ED for complaints of Generalized Body Aches Currently, the patient is resting in bed.  Physical Exam  BP 100/72 (BP Location: Right Arm)   Pulse 63   Temp 97.8 F (36.6 C)   Resp 18   SpO2 100%  Physical Exam General: no distress Cardiac: Regular rate Lungs: equal chest rise Psych: calm, reacting to internal stimuli  ED Course / MDM  EKG:   I have reviewed the labs performed to date as well as medications administered while in observation.  Recent changes in the last 24 hours include none.  Plan  Current plan is for IVC.    Lonell Grandchild, MD 08/19/22 [phone]

## 2022-08-19 NOTE — ED Notes (Signed)
Pt very loud while responding to internal stimuli and disrupting other pts

## 2022-08-19 NOTE — ED Notes (Signed)
Pt increased verbal aggression towards hallucinations and increased volume

## 2022-08-19 NOTE — Progress Notes (Signed)
LCSW Progress Note  [id number]   Betty Henderson  08/19/2022  12:40 PM  Description:   Inpatient Psychiatric Referral  Patient was recommended inpatient per Eligha Bridegroom, NP. There are no available beds at [facility] or [facility] unit, per [facility], Charity fundraiser. Patient was referred to the following out of network facilities:   [facility] Provider Address Phone [facility]  [address] [place] [address] [phone]  [facility]  [address] [place] [address] [phone]  [facility]  [address] [place], [place] [address] [phone]  [facility]  [address], [place] [address] [phone]  [facility]  [address] [place], [place] [address] [phone]  [facility]  [address] [place] [address] [phone]  [facility]  [address], [place] [address] [phone]  [facility]  [address] [place] [address] [phone]  [facility]  [address] [place] [address] [phone]  [facility]  [address] [place] [address] [phone]  [facility]  [address] [place] [address] [phone]  [facility]  [address] [place], [place] [address] [phone]  [facility]  [address] [place] [address] [phone]  [facility]  [address] [place], [place] [address] [phone]  [facility]  [address], [place]  [place] [address] [phone]  [facility]  [address] [place] [address] [phone]  [facility]  [address], [place] [address] [phone]  [facility]  [id number] [place] [address] [phone]  [facility]  [address] [place] [address] [phone]  [facility]  [address] [place] [address] [phone]  [facility]  [address], [place] [address] [phone]  [facility]  [address], [place] [address] [phone]  [facility]  [address] [place] [address] [phone]  [facility]  [address], [place] [address] [phone]  [facility]  [address] [place] [address] [phone]  [facility]  [address], [place] [address] [phone]  [facility]  [address] [place] [address] [phone]  [facility]  [address] [place] [address] [phone]  [facility]  [address] [place], [place] [address] [phone]  [facility] [address] [place]  [address], [place] [address] [phone]  [facility] [place] [address] [phone]  [facility]  [address], [place] [address] [phone]    Situation ongoing, CSW to continue following and update chart as more information becomes available.      Cathie Beams, [place]  08/19/2022 12:40 PM

## 2022-08-19 NOTE — ED Notes (Signed)
Pt encouraged to take a shower. While pt was showering, staff went into room to change linens and clean room. Stool was found in the corner of the room and it had been tracked to doorway. Everything in room cleaned w/ orange top wipes. Floor swept and orange top wipes used on floor.

## 2022-08-19 NOTE — ED Notes (Signed)
Pt received for care at 1900.  Quietly resting in bed with eyes closed; respirations even and unlabored.  Bed in lowest position, wheels locked.  Pt in view of sitter with necessary precautions maintained.

## 2022-08-19 NOTE — ED Notes (Signed)
Pt provided with warm blanket and drink at this time per request.  Pt denies further needs at this time.

## 2022-08-19 NOTE — ED Notes (Signed)
Snack given to pt.

## 2022-08-19 NOTE — ED Notes (Signed)
Pt awake and responding to internal stimuli while in her room.

## 2022-08-19 NOTE — ED Notes (Signed)
Coleman NP at bedside. 

## 2022-08-19 NOTE — Progress Notes (Signed)
[facility] Psych ED Progress Note  08/19/2022 11:56 AM Betty Henderson  MRN:  [id number]   Subjective:   Patient seen at [facility] for face to face psychiatric reevaluation. She is minimally cooperative, disheveled, garbled speech with low volume, and continues to appear disorganized and tangential. Patient is very  minimal with assessment, and difficult to understand her at times. She denies SI/HI. She denies AVH. However, after speaking with ED staff appears patient has been loud, aggressive, and witnessed to be responding to internal stimuli this morning in her room.   Will continue with plan for inpatient psychiatric treatment. Per chart review unsure last time patient was compliant with psychiatric medications. Will continue with Zyprexa, and increase to Zyprexa 5mg  Qam and Zyprexa 10 mg Qhs. There is no availability at [facility], will have CSW fax out.   Principal Problem: Schizoaffective disorder, bipolar type (HCC) Diagnosis:  Principal Problem:   Schizoaffective disorder, bipolar type [facility])   ED Assessment Time Calculation: Start Time: 1200 Stop Time: 1230 Total Time in Minutes (Assessment Completion): 30   [place] Scale:  Flowsheet Row ED from 08/17/2022 in [facility] Department at [facility] ED from 08/12/2022 in [facility] at [facility] ED from 08/11/2022 in [facility] Department at [facility]  C-SSRS RISK CATEGORY No Risk No Risk No Risk       Past Medical History:  Past Medical History:  Diagnosis Date   Anemia    Arthritis    Chronic pain    Drug-seeking behavior    Malingering    Osteopetrosis    Psychosis (HCC)    Schizoaffective disorder, bipolar type (HCC)     Past Surgical History:  Procedure Laterality Date   MOUTH SURGERY     TUBAL LIGATION     Family History:  Family History  Family history unknown: Yes   Social History:  Social History   Substance and Sexual Activity  Alcohol Use Not  Currently   Comment: 1 cocktail 3 weeks ago     Social History   Substance and Sexual Activity  Drug Use Not Currently   Types: Cocaine   Comment: states "it's legal though"    Social History   Socioeconomic History   Marital status: Single    Spouse name: Not on file   Number of children: Not on file   Years of education: Not on file   Highest education level: Not on file  Occupational History   Not on file  Tobacco Use   Smoking status: Every Day    Packs/day: .5    Types: Cigarettes   Smokeless tobacco: Never  Vaping Use   Vaping Use: Never used  Substance and Sexual Activity   Alcohol use: Not Currently    Comment: 1 cocktail 3 weeks ago   Drug use: Not Currently    Types: Cocaine    Comment: states "it's legal though"   Sexual activity: Never  Other Topics Concern   Not on file  Social History Narrative   Not on file   Social Determinants of Health   Financial Resource Strain: Not on file  Food Insecurity: Not on file  Transportation Needs: Not on file  Physical Activity: Not on file  Stress: Not on file  Social Connections: Not on file    Sleep: Fair  Appetite:  Fair  Current Medications: Current Facility-Administered Medications  Medication Dose Route Frequency Provider Last Rate Last Admin   OLANZapine (ZYPREXA) tablet 5  mg  5 mg Oral BID Jacquiline Doe A, NP   5 mg at 08/19/22 0935   OLANZapine zydis (ZYPREXA) disintegrating tablet 5 mg  5 mg Oral Q8H PRN Eligha Bridegroom, NP       And   ziprasidone (GEODON) injection 20 mg  20 mg Intramuscular Q12H PRN Eligha Bridegroom, NP   20 mg at 08/19/22 1037   Current Outpatient Medications  Medication Sig Dispense Refill   ondansetron (ZOFRAN) 4 MG tablet Take 1 tablet (4 mg total) by mouth every 8 (eight) hours as needed for nausea or vomiting. (Patient not taking: Reported on 08/17/2022) 20 tablet 0    Lab Results:  Results for orders placed or performed during the hospital encounter of 08/17/22  (from the past 48 hour(s))  Urine rapid drug screen (hosp performed)     Status: None   Collection Time: 08/17/22  4:50 PM  Result Value Ref Range   Opiates NONE DETECTED NONE DETECTED   Cocaine NONE DETECTED NONE DETECTED   Benzodiazepines NONE DETECTED NONE DETECTED   Amphetamines NONE DETECTED NONE DETECTED   Tetrahydrocannabinol NONE DETECTED NONE DETECTED   Barbiturates NONE DETECTED NONE DETECTED    Comment: (NOTE) DRUG SCREEN FOR MEDICAL PURPOSES ONLY.  IF CONFIRMATION IS NEEDED FOR ANY PURPOSE, NOTIFY LAB WITHIN 5 DAYS.  LOWEST DETECTABLE LIMITS FOR URINE DRUG SCREEN Drug Class                     Cutoff (ng/mL) Amphetamine and metabolites    1000 Barbiturate and metabolites    200 Benzodiazepine                 200 Opiates and metabolites        300 Cocaine and metabolites        300 THC                            50 Performed at [facility] Lab, [address] [place], [place] [address]   Urinalysis, Routine w reflex microscopic -Urine, Clean Catch     Status: Abnormal   Collection Time: 08/17/22  4:51 PM  Result Value Ref Range   Color, Urine YELLOW YELLOW   APPearance CLEAR CLEAR   Specific Gravity, Urine 1.008 1.005 - 1.030   pH 7.0 5.0 - 8.0   Glucose, UA NEGATIVE NEGATIVE mg/dL   Hgb urine dipstick NEGATIVE NEGATIVE   Bilirubin Urine NEGATIVE NEGATIVE   Ketones, ur NEGATIVE NEGATIVE mg/dL   Protein, ur NEGATIVE NEGATIVE mg/dL   Nitrite NEGATIVE NEGATIVE   Leukocytes,Ua TRACE (A) NEGATIVE   RBC / HPF 0-5 0 - 5 RBC/hpf   WBC, UA 0-5 0 - 5 WBC/hpf   Bacteria, UA NONE SEEN NONE SEEN   Squamous Epithelial / HPF 0-5 0 - 5 /HPF   Mucus PRESENT     Comment: Performed at [facility] Lab, [address], [place], [place] [address]  Comprehensive metabolic panel     Status: Abnormal   Collection Time: 08/17/22  5:00 PM  Result Value Ref Range   Sodium 137 135 - 145 mmol/L   Potassium 3.8 3.5 - 5.1 mmol/L   Chloride 105 98 - 111 mmol/L   CO2 23 22 - 32  mmol/L   Glucose, Bld 86 70 - 99 mg/dL    Comment: Glucose reference range applies only to samples taken after fasting for at least 8 hours.  BUN 6 (L) 8 - 23 mg/dL   Creatinine, Ser 7.82 0.44 - 1.00 mg/dL   Calcium 8.9 8.9 - 95.6 mg/dL   Total Protein 6.5 6.5 - 8.1 g/dL   Albumin 3.4 (L) 3.5 - 5.0 g/dL   AST 14 (L) 15 - 41 U/L   ALT 11 0 - 44 U/L   Alkaline Phosphatase 47 38 - 126 U/L   Total Bilirubin 0.5 0.3 - 1.2 mg/dL   GFR, Estimated >21 >30 mL/min    Comment: (NOTE) Calculated using the CKD-EPI Creatinine Equation (2021)    Anion gap 9 5 - 15    Comment: Performed at [facility] Lab, [address] [place], [place] [address]  Ethanol     Status: None   Collection Time: 08/17/22  5:00 PM  Result Value Ref Range   Alcohol, Ethyl (B) <10 <10 mg/dL    Comment: (NOTE) Lowest detectable limit for serum alcohol is 10 mg/dL.  For medical purposes only. Performed at [facility] Lab, [address], [place], [place] [address]   CBC with Diff     Status: Abnormal   Collection Time: 08/17/22  5:00 PM  Result Value Ref Range   WBC 4.9 4.0 - 10.5 K/uL   RBC 3.60 (L) 3.87 - 5.11 MIL/uL   Hemoglobin 10.9 (L) 12.0 - 15.0 g/dL   HCT 95.2 (L) 84.1 - 32.4 %   MCV 96.1 80.0 - 100.0 fL   MCH 30.3 26.0 - 34.0 pg   MCHC 31.5 30.0 - 36.0 g/dL   RDW 40.1 02.7 - 25.3 %   Platelets 319 150 - 400 K/uL    Comment: REPEATED TO VERIFY   nRBC 0.0 0.0 - 0.2 %   Neutrophils Relative % 47 %   Neutro Abs 2.3 1.7 - 7.7 K/uL   Lymphocytes Relative 39 %   Lymphs Abs 1.9 0.7 - 4.0 K/uL   Monocytes Relative 9 %   Monocytes Absolute 0.5 0.1 - 1.0 K/uL   Eosinophils Relative 4 %   Eosinophils Absolute 0.2 0.0 - 0.5 K/uL   Basophils Relative 1 %   Basophils Absolute 0.0 0.0 - 0.1 K/uL   Immature Granulocytes 0 %   Abs Immature Granulocytes 0.01 0.00 - 0.07 K/uL    Comment: Performed at [facility] Lab, [address] [place], [place] [address]  Ammonia     Status: None   Collection  Time: 08/17/22  5:00 PM  Result Value Ref Range   Ammonia 33 9 - 35 umol/L    Comment: Performed at [facility] Lab, [address], [place], [place] [address]  CK     Status: None   Collection Time: 08/17/22  5:00 PM  Result Value Ref Range   Total CK 142 38 - 234 U/L    Comment: Performed at [facility], [address] [place], [place] [address]    Blood Alcohol level:  Lab Results  Component Value Date   [facility] <10 08/17/2022   ETH <10 01/11/2021    Psychiatric Specialty Exam:  Presentation  General Appearance:  Disheveled  Eye Contact: Fair  Speech: Garbled  Speech Volume: Decreased  Handedness:No data recorded  Mood and Affect  Mood: Euthymic  Affect: Flat   Thought Process  Thought Processes: Disorganized  Descriptions of Associations:Tangential  Orientation:Partial  Thought Content:Illogical  History of Schizophrenia/Schizoaffective disorder:No data recorded Duration of Psychotic Symptoms:No data recorded Hallucinations:Hallucinations: None  Ideas of Reference:None  Suicidal Thoughts:Suicidal  Thoughts: No  Homicidal Thoughts:Homicidal Thoughts: No   Sensorium  Memory: Immediate Poor; Recent Poor  Judgment: Impaired  Insight: Poor   Executive Functions  Concentration: Poor  Attention Span: Poor  Recall: Poor  Fund of Knowledge: Poor  Language: Poor   Psychomotor Activity  Psychomotor Activity: Psychomotor Activity: Normal   Assets  Assets: Desire for Improvement; Physical Health   Sleep  Sleep: Sleep: Fair    Physical Exam: Physical Exam Neurological:     Mental Status: She is alert and oriented to person, place, and time.  Psychiatric:        Attention and Perception: She is inattentive.        Mood and Affect: Affect is flat.        Speech: Speech is tangential.        Behavior: Behavior is agitated.    Review of Systems  Psychiatric/Behavioral:  Positive for hallucinations.         Responding to internal stimuli, agitated, minimally cooperative  All other systems reviewed and are negative.  Blood pressure 100/72, pulse 63, temperature 97.8 F (36.6 C), resp. rate 18, SpO2 100 %. There is no height or weight on file to calculate BMI.   Medical Decision Making: Pt case reviewed and discussed with Dr. Viviano Simas. Will continue to recommend IVC and inpatient psychiatric treatment at this time. Medication adjustments have been made. CSW will fax out patient as there is no current availability at [facility].   - Zyprexa increased to 10 mg Qhs, will continue Zyprexa 5mg  Qam  Eligha Bridegroom, NP 08/19/2022, 11:56 AM

## 2022-08-20 ENCOUNTER — Encounter (HOSPITAL_COMMUNITY): Payer: Self-pay | Admitting: Psychiatry

## 2022-08-20 MED ORDER — LORAZEPAM 1 MG PO TABS
1.0000 mg | ORAL_TABLET | Freq: Once | ORAL | Status: AC
  Administered 2022-08-20: 1 mg via ORAL
  Filled 2022-08-20: qty 1

## 2022-08-20 NOTE — ED Notes (Signed)
Pt continues to be loud in room; easily redirectable but once staff steps out, pt is loud again.  This RN spoke with charge RN about linked PRN orders who recommended speaking with PA in [facility].  Per PA, will try PO Ativan as pt had this prior and will try to avoid IM at this time.

## 2022-08-20 NOTE — Progress Notes (Signed)
[facility] Psych ED Progress Note  08/20/2022 1:11 PM Betty Henderson  MRN:  [id number]   Subjective: Patient seen today at [facility] for face-to-face psychiatric reevaluation.  Patient upon evaluation appreciably more engaged during our time spent together, more attentive, and reports she is tolerating medications and dose increase changes made.  Patient reports eating and sleeping are good.  Patient reports no auditory visual hallucinations and/or suicidal homicidal ideations.  Patient has not had any behavioral outbursts since last engagement.  Patient orientation is grossly intact.  Patient endorses mood is "good." Discussed continued disposition plan of inpatient recommendation that patient verbalized she is in agreement with for further medication stabilization.    Principal Problem: Schizoaffective disorder, bipolar type (HCC) Diagnosis:  Principal Problem:   Schizoaffective disorder, bipolar type [facility])   ED Assessment Time Calculation: Start Time: 1200 Stop Time: 1230 Total Time in Minutes (Assessment Completion): 30   Past Psychiatric History: Schizoaffective disorder, bipolar type (HCC)  G[place] Scale:  Flowsheet Row ED from 08/17/2022 in [facility] Emergency Department at [facility] ED from 08/12/2022 in [facility] Department at [facility] ED from 08/11/2022 in [facility] Department at [facility]  C-SSRS RISK CATEGORY No Risk No Risk No Risk       Past Medical History:  Past Medical History:  Diagnosis Date   Anemia    Arthritis    Chronic pain    Drug-seeking behavior    Malingering    Osteopetrosis    Psychosis (HCC)    Schizoaffective disorder, bipolar type (HCC)     Past Surgical History:  Procedure Laterality Date   MOUTH SURGERY     TUBAL LIGATION     Family History:  Family History  Family history unknown: Yes   Social History:  Social History   Substance and Sexual Activity  Alcohol Use Not Currently    Comment: 1 cocktail 3 weeks ago     Social History   Substance and Sexual Activity  Drug Use Not Currently   Types: Cocaine   Comment: states "it's legal though"    Social History   Socioeconomic History   Marital status: Single    Spouse name: Not on file   Number of children: Not on file   Years of education: Not on file   Highest education level: Not on file  Occupational History   Not on file  Tobacco Use   Smoking status: Every Day    Packs/day: .5    Types: Cigarettes   Smokeless tobacco: Never  Vaping Use   Vaping Use: Never used  Substance and Sexual Activity   Alcohol use: Not Currently    Comment: 1 cocktail 3 weeks ago   Drug use: Not Currently    Types: Cocaine    Comment: states "it's legal though"   Sexual activity: Never  Other Topics Concern   Not on file  Social History Narrative   Not on file   Social Determinants of Health   Financial Resource Strain: Not on file  Food Insecurity: Not on file  Transportation Needs: Not on file  Physical Activity: Not on file  Stress: Not on file  Social Connections: Not on file    Sleep: Good  Appetite:  Good  Current Medications: Current Facility-Administered Medications  Medication Dose Route Frequency Provider Last Rate Last Admin   OLANZapine (ZYPREXA) tablet 10 mg  10 mg Oral QHS Eligha Bridegroom, NP   10 mg at 08/19/22 2147  OLANZapine (ZYPREXA) tablet 5 mg  5 mg Oral Daily Eligha Bridegroom, NP   5 mg at 08/20/22 1003   OLANZapine zydis (ZYPREXA) disintegrating tablet 5 mg  5 mg Oral Q8H PRN Eligha Bridegroom, NP   5 mg at 08/20/22 0153   And   ziprasidone (GEODON) injection 20 mg  20 mg Intramuscular Q12H PRN Eligha Bridegroom, NP   20 mg at 08/19/22 1037   Current Outpatient Medications  Medication Sig Dispense Refill   ondansetron (ZOFRAN) 4 MG tablet Take 1 tablet (4 mg total) by mouth every 8 (eight) hours as needed for nausea or vomiting. (Patient not taking: Reported on 08/17/2022) 20 tablet  0    Lab Results: No results found for this or any previous visit (from the past 48 hour(s)).  Blood Alcohol level:  Lab Results  Component Value Date   [facility] <10 08/17/2022   ETH <10 01/11/2021    Psychiatric Specialty Exam:  Presentation  General Appearance:  Disheveled (Atypical interpersonal style)  Eye Contact: Other (comment) (Variable to brief)  Speech: Other (comment) (Improved, mostly clear, intermittently garbled)  Speech Volume: Normal  Handedness: Right   Mood and Affect  Mood: -- ("Good")  Affect: Other (comment) (Neutral if mildly constricted)   Thought Process  Thought Processes: Other (comment) (superficially linear, concrete)  Descriptions of Associations:Intact  Orientation:Other (comment) (Grossly intact)  Thought Content:Other (comment) (Superficially logical, short)  History of Schizophrenia/Schizoaffective disorder:No data recorded Duration of Psychotic Symptoms:No data recorded Hallucinations:Hallucinations: None  Ideas of Reference:None  Suicidal Thoughts:Suicidal Thoughts: No  Homicidal Thoughts:Homicidal Thoughts: No   Sensorium  Memory: Immediate Poor; Remote Poor  Judgment: Impaired  Insight: Poor   Executive Functions  Concentration: Poor  Attention Span: Poor  Recall: Poor  Fund of Knowledge: Poor  Language: Fair   Psychomotor Activity  Psychomotor Activity: Psychomotor Activity: Normal   Assets  Assets: Physical Health; Desire for Improvement   Sleep  Sleep: Sleep: Fair    Physical Exam: Physical Exam Vitals and nursing note reviewed.  Pulmonary:     Effort: Pulmonary effort is normal.  Neurological:     Mental Status: She is alert.  Psychiatric:        Attention and Perception: She does not perceive auditory or visual hallucinations.        Thought Content: Thought content is not paranoid or delusional. Thought content does not include homicidal or suicidal ideation.     Review of Systems  All other systems reviewed and are negative.  Blood pressure 115/70, pulse 93, temperature 98.1 F (36.7 C), temperature source Oral, resp. rate 16, SpO2 100 %. There is no height or weight on file to calculate BMI.   Medical Decision Making:  Will continue with plan for inpatient psychiatric treatment. Psychiatry will continue to follow the patient to address any psychiatric needs until disposition is completed.   #Schizoaffective disorder, bipolar type (HCC)  -Continue current medication regimen -Continue disposition recommendation for inpatient   Lenox Ponds, NP 08/20/2022, 1:11 PM

## 2022-08-20 NOTE — ED Notes (Signed)
Pt continues to respond to internal stimuli in room; regularly heard talking in room with no one present.  No aggression noted; pt remains polite when staff engage with her.

## 2022-08-20 NOTE — ED Notes (Signed)
Pt continues to respond to internal stimuli in room.  Appears a little more calm, less cussing noted and pt now laughing more.  Pt still remains intermittently loud however.

## 2022-08-20 NOTE — ED Notes (Signed)
This RN entered pt's room and asked her to try to lower her voice due to other patients trying to sleep.  Pt continues to respond to internal stimuli but apologizes for being loud.  This RN encouraged pt to try and get some more rest since she still has about 2 hours before we will re-obtain her vital signs.

## 2022-08-20 NOTE — Progress Notes (Signed)
LCSW Progress Note  [id number]   Aya Klinkhammer  08/20/2022  12:52 PM  Description:   Inpatient Psychiatric Referral  Patient was recommended inpatient per Eligha Bridegroom, NP. There are no available beds at [facility], per [facility] Rona Ravens, RN. Patient was referred to the following out of network facilities:   [facility] Provider Address Phone [facility]  [address] [place] [address] [phone]  [facility]  [address] [place] [address] [phone]  [facility]  [address] [place], [place] [address] [phone]  [facility]  [address], [place] [address] [phone]  [facility]  [address] [place], [place] [address] [phone]  [facility]  [address] [place] [address] [phone]  [facility]  [address], [place] [address] [phone]  [facility]  [address] [place] [address] [phone]  [facility]  [address] [place] [address] [phone]  [facility]  [address] [place] [address] [phone]  [facility]  [address], [place] [address] [phone]  [facility]  [address] [place], [place] [address] [phone]  [facility]  [address], [place] [address] [phone]  [facility]  [address], [place] [address] [phone]  [facility]  [address], [place] [address] [phone]  [facility]  [address], [place] [address] [phone]  [facility]  [address] [place] [address] [phone]  [facility]  [phone] [place] [address] [phone]  [facility]  [address] [place] [address] [phone]  [facility]  [address] [place] [address] [phone]  [facility]  [address] [place] [address] [phone]  [facility]  [address], [place] [address] [phone]  [facility]  [address] [place] [address] [phone]  [facility]  [address], [place] [address] [phone]  [facility]  [address] [place] [address] [phone]  [facility]  [address], [place] [address] [phone]  [facility]  [address], [place] [address] [phone]  [facility]  [address] [place] [address] [phone]  [facility]  [address] [place], [place] [address] [phone]  [facility] [address] [place]  [address] [place] [address] [phone]  [facility] [place] [address] [phone]  [facility]  [address], [place] [address] [phone]    Situation ongoing, CSW to continue following and update chart as more information becomes available.      Cathie Beams, [place]  08/20/2022 12:52 PM

## 2022-08-20 NOTE — ED Notes (Addendum)
Pt woke up and started yelling and cussing in room; appears to be reacting to internal stimuli.  Pt redirectable and polite when talking with staff but goes back to reacting to stimuli after staff leaves.  This RN asked pt if she would be willing to take her PRN Zyprexa to help calm her down.  Pt agreeable to same.

## 2022-08-20 NOTE — ED Notes (Signed)
Pt offered warm blankets at this time; pt agreeable and provided with same.

## 2022-08-20 NOTE — ED Provider Notes (Signed)
Emergency Medicine Observation Re-evaluation Note  Betty Henderson is a 62 y.o. female, seen on rounds today.  Pt initially presented to the ED for complaints of Generalized Body Aches Currently, the patient is awake and resting without complaints.  Physical Exam  BP 115/70 (BP Location: Left Arm)   Pulse 93   Temp 98.1 F (36.7 C) (Oral)   Resp 16   SpO2 100%  Physical Exam General: No agitation, resting Cardiac: No murmur on my exam Lungs: Clear breath sounds bilaterally Psych: No acute agitation at this time  ED Course / MDM  EKG:   I have reviewed the labs performed to date as well as medications administered while in observation.  Recent changes in the last 24 hours include none reported by overnight nursing.  Plan  Current plan is for awaiting.    Frederika Hukill, Canary Brim, MD 08/20/22 [phone]

## 2022-08-21 MED ORDER — LORAZEPAM 2 MG/ML IJ SOLN
2.0000 mg | Freq: Once | INTRAMUSCULAR | Status: AC
  Administered 2022-08-21: 2 mg via INTRAMUSCULAR
  Filled 2022-08-21: qty 1

## 2022-08-21 MED ORDER — STERILE WATER FOR INJECTION IJ SOLN
INTRAMUSCULAR | Status: AC
  Administered 2022-08-21: 1.2 mL
  Filled 2022-08-21: qty 10

## 2022-08-21 NOTE — ED Notes (Signed)
Patient becoming increasingly more agitated. Patient responding to internal stimuli, conversing with self, yelling and cussing at hallucination. Patient also struck bed.

## 2022-08-21 NOTE — ED Notes (Signed)
Pt hostile and agitated, redirection attempted and was unsuccessful, pt medicated for agitation per PRN orders. No other acute distress noted at this time.

## 2022-08-21 NOTE — ED Provider Notes (Signed)
Emergency Medicine Observation Re-evaluation Note  Betty Henderson is a 62 y.o. female, seen on rounds today.  Pt initially presented to the ED for complaints of Generalized Body Aches Currently, the patient is sleeping.  Physical Exam  BP 112/76 (BP Location: Right Arm)   Pulse 85   Temp 98.2 F (36.8 C) (Oral)   Resp 15   SpO2 97%  Physical Exam General: Sleeping Cardiac: Extremities well-perfused Lungs: Breathing is unlabored Psych: Deferred  ED Course / MDM  EKG:   I have reviewed the labs performed to date as well as medications administered while in observation.  Recent changes in the last 24 hours include agitation early this morning.  As needed medications given.  Plan  Current plan is for inpatient psychiatric admission.    Gloris Manchester, MD 08/21/22 0930

## 2022-08-21 NOTE — ED Notes (Signed)
Shift report received, assumed care of patient at this time.  

## 2022-08-21 NOTE — Progress Notes (Signed)
LCSW Progress Note  [id number]   Betty Henderson  08/21/2022  10:39 AM  Description:   Inpatient Psychiatric Referral  Patient was recommended inpatient per Ophelia Shoulder, NP. There are no available beds at [facility] or [facility], per [facility] Rona Ravens, RN. Patient was referred to the following out of network facilities:   [facility] Provider Address Phone [facility]  [address] [place] [address] [phone]  [facility]  [address] [place] [address] [phone]  [facility]  [address] [place], [place] [address] [phone]  [facility]  [address], [place] [address] [phone]  [facility]  [address] [place], [place] [address] [phone]  [facility]  [address] [place] [address] [phone]  [facility]  [address], [place] [address] [phone]  [facility]  [address] [place] [address] [phone]  [facility]  [address] [place] [address] [phone]  [facility]  [address] [place] [address] [phone]  [facility]  [address] [place] [address] [phone]  [facility]  [address] [place], [place] [address] [phone]  [facility]  [address], [place] [address] [phone]  [facility]  [address], [place] [address] [phone]  [facility]  [address] [place]  [address] [phone]  [facility]  [address], [place] [address] [phone]  [facility]  [address], [place] [address] [phone]  [facility]  [id number] [place] [address] [phone]  [facility]  [address] [place] [address] [phone]  [facility]  [address] [place] [address] [phone]  [facility]  [address], [place] [address] [phone]  [facility]  [address], [place] [address] [phone]  [facility]  [address] [place] [address] [phone]  [facility]  [address], [place] [address] [phone]  [facility]  [address] [place] [address] [phone]  [facility]  [address], [place] [address] [phone]  [facility]  [address], [place] [address] [phone]  [facility]  [address] [place] [address] [phone]  [facility]  [address] [place], [place] [address] [phone]  [facility] [address] [place]  [address], [place] [address] [phone]  [facility] [place] [address] [phone]  [facility]  [address] [place] [address] [phone]     Situation ongoing, CSW to continue following and update chart as more information becomes available.      Cathie Beams, [place]  08/21/2022 10:39 AM

## 2022-08-21 NOTE — ED Notes (Signed)
IVC exp 6/2; Inpt tx

## 2022-08-21 NOTE — ED Notes (Signed)
Ivc status checked 

## 2022-08-22 ENCOUNTER — Encounter (HOSPITAL_COMMUNITY): Payer: Self-pay

## 2022-08-22 ENCOUNTER — Encounter: Payer: Self-pay | Admitting: Psychiatry

## 2022-08-22 ENCOUNTER — Other Ambulatory Visit: Payer: Self-pay

## 2022-08-22 ENCOUNTER — Inpatient Hospital Stay
Admission: RE | Admit: 2022-08-22 | Discharge: 2023-03-28 | DRG: 885 | Disposition: A | Discharge status: Home / Self Care | Payer: 59 | Source: Intra-hospital | Attending: Psychiatry | Admitting: Psychiatry

## 2022-08-22 DIAGNOSIS — Z91148 Patient's other noncompliance with medication regimen for other reason: Secondary | ICD-10-CM | POA: Diagnosis not present

## 2022-08-22 DIAGNOSIS — E8881 Metabolic syndrome: Secondary | ICD-10-CM | POA: Diagnosis not present

## 2022-08-22 DIAGNOSIS — M1990 Unspecified osteoarthritis, unspecified site: Secondary | ICD-10-CM | POA: Diagnosis present

## 2022-08-22 DIAGNOSIS — Z5900 Homelessness unspecified: Secondary | ICD-10-CM | POA: Diagnosis not present

## 2022-08-22 DIAGNOSIS — F250 Schizoaffective disorder, bipolar type: Principal | ICD-10-CM | POA: Diagnosis present

## 2022-08-22 DIAGNOSIS — G8929 Other chronic pain: Secondary | ICD-10-CM | POA: Diagnosis present

## 2022-08-22 DIAGNOSIS — Z5941 Food insecurity: Secondary | ICD-10-CM

## 2022-08-22 DIAGNOSIS — Z91018 Allergy to other foods: Secondary | ICD-10-CM

## 2022-08-22 DIAGNOSIS — G4700 Insomnia, unspecified: Secondary | ICD-10-CM | POA: Diagnosis present

## 2022-08-22 DIAGNOSIS — Z888 Allergy status to other drugs, medicaments and biological substances status: Secondary | ICD-10-CM

## 2022-08-22 DIAGNOSIS — B351 Tinea unguium: Secondary | ICD-10-CM | POA: Diagnosis present

## 2022-08-22 DIAGNOSIS — Z5982 Transportation insecurity: Secondary | ICD-10-CM

## 2022-08-22 DIAGNOSIS — E119 Type 2 diabetes mellitus without complications: Secondary | ICD-10-CM | POA: Diagnosis present

## 2022-08-22 DIAGNOSIS — R4781 Slurred speech: Secondary | ICD-10-CM | POA: Diagnosis not present

## 2022-08-22 DIAGNOSIS — R12 Heartburn: Secondary | ICD-10-CM | POA: Diagnosis not present

## 2022-08-22 DIAGNOSIS — Z9102 Food additives allergy status: Secondary | ICD-10-CM

## 2022-08-22 DIAGNOSIS — R32 Unspecified urinary incontinence: Secondary | ICD-10-CM | POA: Diagnosis not present

## 2022-08-22 DIAGNOSIS — Q782 Osteopetrosis: Secondary | ICD-10-CM | POA: Diagnosis not present

## 2022-08-22 DIAGNOSIS — M549 Dorsalgia, unspecified: Secondary | ICD-10-CM | POA: Diagnosis not present

## 2022-08-22 DIAGNOSIS — Z91048 Other nonmedicinal substance allergy status: Secondary | ICD-10-CM

## 2022-08-22 DIAGNOSIS — F259 Schizoaffective disorder, unspecified: Secondary | ICD-10-CM | POA: Diagnosis not present

## 2022-08-22 DIAGNOSIS — Z9851 Tubal ligation status: Secondary | ICD-10-CM

## 2022-08-22 DIAGNOSIS — R413 Other amnesia: Secondary | ICD-10-CM | POA: Diagnosis present

## 2022-08-22 DIAGNOSIS — E782 Mixed hyperlipidemia: Secondary | ICD-10-CM | POA: Diagnosis present

## 2022-08-22 DIAGNOSIS — F17210 Nicotine dependence, cigarettes, uncomplicated: Secondary | ICD-10-CM | POA: Diagnosis present

## 2022-08-22 DIAGNOSIS — F200 Paranoid schizophrenia: Secondary | ICD-10-CM | POA: Diagnosis present

## 2022-08-22 MED ORDER — ACETAMINOPHEN 325 MG PO TABS
650.0000 mg | ORAL_TABLET | Freq: Four times a day (QID) | ORAL | Status: DC | PRN
  Administered 2022-08-24 – 2023-03-17 (×89): 650 mg via ORAL
  Filled 2022-08-22 (×92): qty 2

## 2022-08-22 MED ORDER — TRAZODONE HCL 100 MG PO TABS
100.0000 mg | ORAL_TABLET | Freq: Every evening | ORAL | Status: DC | PRN
  Administered 2022-08-22: 100 mg via ORAL
  Filled 2022-08-22: qty 1

## 2022-08-22 MED ORDER — ZIPRASIDONE MESYLATE 20 MG IM SOLR: 20.0000 mg | Freq: Two times a day (BID) | INTRAMUSCULAR | Status: DC | PRN

## 2022-08-22 MED ORDER — OLANZAPINE 5 MG PO TBDP
5.0000 mg | ORAL_TABLET | Freq: Three times a day (TID) | ORAL | Status: DC | PRN
  Administered 2022-08-22: 5 mg via ORAL
  Filled 2022-08-22: qty 1

## 2022-08-22 MED ORDER — LORAZEPAM 1 MG PO TABS: 1.0000 mg | ORAL_TABLET | Freq: Three times a day (TID) | ORAL | Status: DC | PRN

## 2022-08-22 MED ORDER — OLANZAPINE 5 MG PO TABS
10.0000 mg | ORAL_TABLET | Freq: Every day | ORAL | Status: DC
  Administered 2022-08-22: 10 mg via ORAL
  Filled 2022-08-22: qty 2

## 2022-08-22 MED ORDER — ALUM & MAG HYDROXIDE-SIMETH 200-200-20 MG/5ML PO SUSP
30.0000 mL | ORAL | Status: DC | PRN
  Administered 2022-09-10 – 2023-02-28 (×13): 30 mL via ORAL
  Filled 2022-08-22 (×13): qty 30

## 2022-08-22 MED ORDER — MAGNESIUM HYDROXIDE 400 MG/5ML PO SUSP
30.0000 mL | Freq: Every day | ORAL | Status: DC | PRN
  Administered 2022-11-08 – 2023-03-04 (×5): 30 mL via ORAL
  Filled 2022-08-22 (×5): qty 30

## 2022-08-22 MED ORDER — LORAZEPAM 1 MG PO TABS
1.0000 mg | ORAL_TABLET | Freq: Three times a day (TID) | ORAL | Status: DC | PRN
  Administered 2022-08-22 – 2023-02-24 (×20): 1 mg via ORAL
  Filled 2022-08-22 (×21): qty 1

## 2022-08-22 NOTE — Progress Notes (Signed)
Patient appears to be responding to internal stimuli.  Agitated, cursing and loud.  PRN PO Zyprexa given for agitation.  Patient compliant with medication, but irritable and yelling at this writer.

## 2022-08-22 NOTE — Progress Notes (Signed)
Patient speaking loudly in dayroom, agitated.  Able to be verbally re-directed to lower her voice briefly, but then started to escalate again.  Patient directed to return to her room.

## 2022-08-22 NOTE — Plan of Care (Signed)

## 2022-08-22 NOTE — ED Provider Notes (Signed)
Emergency Medicine Observation Re-evaluation Note  Betty Henderson is a 62 y.o. female, seen on rounds today.  Pt initially presented to the ED for complaints of Generalized Body Aches Currently, the patient is resting in bed, denies complaints.  Physical Exam  BP 126/82   Pulse 87   Temp 97.9 F (36.6 C)   Resp 18   SpO2 98%  Physical Exam General: no distress Cardiac: regular rate Lungs: equal chest rise Psych: calm  ED Course / MDM  EKG:   I have reviewed the labs performed to date as well as medications administered while in observation.  Recent changes in the last 24 hours include none.  Plan  Current plan is for pending psychiatric placement.    Lonell Grandchild, MD 08/22/22 [phone]

## 2022-08-22 NOTE — Group Note (Signed)
Recreation Therapy Group Note   Group Topic:Coping Skills  Group Date: 08/22/2022 Start Time: 1400 End Time: 1500 Facilitators: Rosina Lowenstein, LRT, CTRS Location: Courtyard  Group Description: Music Reminisce. LRT encouraged patients to think of their favorite song(s) that reminded them of a positive memory or time in their life. LRT encouraged patient to talk about that memory aloud to the group. LRT played the song through a speaker for all to hear. LRT and patients discussed how thinking of a positive memory or time in their life can be used as a coping skill in everyday life post discharge.   Goal Area(s) Addressed: Patient will increase verbal communication by conversing with peers. Patient will contribute to group discussion with minimal prompting. Patient will reminisce a positive memory or moment in their life.   Affect/Mood: N/A   Participation Level: Did not attend    Clinical Observations/Individualized Feedback: Daveney did not attend group due to not being on the unit yet.   Plan: Continue to engage patient in RT group sessions 2-3x/week.   [address] LRT, CTRS 08/22/2022 3:24 PM

## 2022-08-22 NOTE — Progress Notes (Addendum)
Pt was accepted to [facility] Unit TODAY 08/22/2022; Bed Assignment L26A-AA  Pt meets inpatient criteria per Ophelia Shoulder, NP  Attending Physician will be Dr. Elane Fritz, DO  Report phone number: [phone]  [facility] Malva Limes, RN and nursing team coordinated transfer.    Kelton Pillar, LCSWA 08/22/2022 @ 6:56 PM

## 2022-08-22 NOTE — Tx Team (Signed)
Initial Treatment Plan 08/22/2022 3:04 PM Clata Bircher ZOX:[id number]    PATIENT STRESSORS: Financial difficulties   Health problems   Medication change or noncompliance     PATIENT STRENGTHS: Ability for insight    PATIENT IDENTIFIED PROBLEMS:   Homelessness  "I don't know why I am here."                 DISCHARGE CRITERIA:  Ability to meet basic life and health needs Adequate post-discharge living arrangements Improved stabilization in mood, thinking, and/or behavior Safe-care adequate arrangements made  PRELIMINARY DISCHARGE PLAN: Attend aftercare/continuing care group Attend PHP/IOP Placement in alternative living arrangements  PATIENT/FAMILY INVOLVEMENT: This treatment plan has been presented to and reviewed with the patient, Teina Campana. The patient has been given the opportunity to ask questions and make suggestions.  Luane School, RN 08/22/2022, 3:04 PM

## 2022-08-22 NOTE — Progress Notes (Signed)
Patient admitted under IVC from [facility] ER to geropsychiatry unit.  Patient has diagnosis of schizoaffective disorder.  Patient appears to be responding to internal stimuli, but denies AVH.  Argumentative with voices and becoming intermittently agitated.  Non compliant with home medications.  Patient denies knowing why she was brought to the hospital.  Denies SI and HI.  Patient's speech is tangential and pressured, at times difficult to understand.  Patient denies pain and is able to ambulate independently.  No hearing aids, dentures or glasses.  Patient denies alcohol and drug use, but reports she smokes 5 cigarettes a day.  Declined nicotine patch.  Patient states she has 2 grown children, a sister and husband for a support system.    Skin assessment completed with Talbert Forest, RN.  Skin is dry. Patient has thick, long, brittle big toe nails. No contraband found.   Emotional support and reassurance provided throughout admission intake. Afterwards, oriented patient to unit, room and dayroom.  Denies any needs at this time.  15 minute safety checks in place for safety.

## 2022-08-23 DIAGNOSIS — F250 Schizoaffective disorder, bipolar type: Secondary | ICD-10-CM | POA: Diagnosis not present

## 2022-08-23 MED ORDER — HALOPERIDOL 5 MG PO TABS
5.0000 mg | ORAL_TABLET | Freq: Four times a day (QID) | ORAL | Status: DC | PRN
  Administered 2022-08-23 – 2022-09-28 (×22): 5 mg via ORAL
  Filled 2022-08-23 (×24): qty 1

## 2022-08-23 MED ORDER — TRAZODONE HCL 50 MG PO TABS
50.0000 mg | ORAL_TABLET | Freq: Every evening | ORAL | Status: DC | PRN
  Administered 2022-08-23 – 2023-03-24 (×189): 50 mg via ORAL
  Filled 2022-08-23 (×191): qty 1

## 2022-08-23 MED ORDER — CHLORPROMAZINE HCL 50 MG PO TABS
50.0000 mg | ORAL_TABLET | Freq: Every day | ORAL | Status: DC
  Administered 2022-08-23 – 2022-09-01 (×10): 50 mg via ORAL
  Filled 2022-08-23 (×10): qty 1

## 2022-08-23 MED ORDER — DIPHENHYDRAMINE HCL 50 MG/ML IJ SOLN: 50.0000 mg | Freq: Four times a day (QID) | INTRAMUSCULAR | Status: DC | PRN

## 2022-08-23 MED ORDER — OLANZAPINE 5 MG PO TABS
7.5000 mg | ORAL_TABLET | ORAL | Status: DC
  Administered 2022-08-23 – 2022-08-27 (×8): 7.5 mg via ORAL
  Filled 2022-08-23 (×8): qty 2

## 2022-08-23 MED ORDER — HALOPERIDOL LACTATE 5 MG/ML IJ SOLN: 5.0000 mg | Freq: Four times a day (QID) | INTRAMUSCULAR | Status: DC | PRN

## 2022-08-23 MED ORDER — DIPHENHYDRAMINE HCL 25 MG PO CAPS
50.0000 mg | ORAL_CAPSULE | Freq: Four times a day (QID) | ORAL | Status: DC | PRN
  Administered 2022-08-23 – 2022-09-29 (×22): 50 mg via ORAL
  Filled 2022-08-23 (×25): qty 2

## 2022-08-23 NOTE — Group Note (Signed)
Date:  08/23/2022 Time:  10:04 PM  Group Topic/Focus:  Building Self Esteem:   The Focus of this group is helping patients become aware of the effects of self-esteem on their lives, the things they and others do that enhance or undermine their self-esteem, seeing the relationship between their level of self-esteem and the choices they make and learning ways to enhance self-esteem. Coping With Mental Health Crisis:   The purpose of this group is to help patients identify strategies for coping with mental health crisis.  Group discusses possible causes of crisis and ways to manage them effectively. Conflict Resolution:   The focus of this group is to discuss the conflict resolution process and how it may be used upon discharge. Developing a Wellness Toolbox:   The focus of this group is to help patients develop a "wellness toolbox" with skills and strategies to promote recovery upon discharge. Early Warning Signs:   The focus of this group is to help patients identify signs or symptoms they exhibit before slipping into an unhealthy state or crisis. Emotional Education:   The focus of this group is to discuss what feelings/emotions are, and how they are experienced. Goals Group:   The focus of this group is to help patients establish daily goals to achieve during treatment and discuss how the patient can incorporate goal setting into their daily lives to aide in recovery. Healthy Communication:   The focus of this group is to discuss communication, barriers to communication, as well as healthy ways to communicate with others. Identifying Needs:   The focus of this group is to help patients identify their personal needs that have been historically problematic and identify healthy behaviors to address their needs. Making Healthy Choices:   The focus of this group is to help patients identify negative/unhealthy choices they were using prior to admission and identify positive/healthier coping strategies to  replace them upon discharge. Managing Feelings:   The focus of this group is to identify what feelings patients have difficulty handling and develop a plan to handle them in a healthier way upon discharge. Overcoming Stress:   The focus of this group is to define stress and help patients assess their triggers. Personal Choices and Values:   The focus of this group is to help patients assess and explore the importance of values in their lives, how their values affect their decisions, how they express their values and what opposes their expression. Rediscovering Joy:   The focus of this group is to explore various ways to relieve stress in a positive manner.    Participation Level:  Minimal  Participation Quality:  Redirectable  Affect:  Angry, Anxious, Blunted, and Irritable  Cognitive:  Confused, Delusional, and Hallucinating  Insight: Lacking and Limited  Engagement in Group:  Off Topic and Poor  Modes of Intervention:  Activity, Discussion, and Support  Additional Comments:    Betty Henderson 08/23/2022, 10:04 PM

## 2022-08-23 NOTE — Progress Notes (Signed)
Patient pleasant and cooperative with staff at times. Other times patient is observed responding to internal stimuli - yelling,cursing and agitated.  Patient able to be verbally redirected.  Speech is pressured and tangential - difficult to understand.  Patient did not participate in MHT group.  Patient returned to her room agitated and speaking to herself. Good appetite and reports she slept well.  Often disruptive to milieu. No interaction with peers. Compliant with VS. 15 min checks in place for safety.  Compliant with scheduled medication.

## 2022-08-23 NOTE — Plan of Care (Signed)
Alert and oriented with confusion. Speech pressured. Internally preoccupied. Pt is irritable and agitated.  Pt is loud, yelling and cussing. Po medications given as scheduled. Haldol 5 mg po, Ativan 1 mg po and Benadryl 50 mg given PRN for agitation, respectively. Tol well. Denies SI/HI. No c/o pain/discomfort noted. Plan of care continued.  Problem: Activity: Goal: Risk for activity intolerance will decrease Outcome: Progressing   Problem: Nutrition: Goal: Adequate nutrition will be maintained Outcome: Progressing   Problem: Coping: Goal: Level of anxiety will decrease Outcome: Progressing   Problem: Elimination: Goal: Will not experience complications related to bowel motility Outcome: Progressing Goal: Will not experience complications related to urinary retention Outcome: Progressing   Problem: Pain Managment: Goal: General experience of comfort will improve Outcome: Progressing   Problem: Safety: Goal: Ability to remain free from injury will improve Outcome: Progressing

## 2022-08-23 NOTE — Progress Notes (Signed)
[facility] LCSW Progress Note   08/23/2022    3:30 PM  Betty Henderson   [id number]   Type of Contact and Topic: Care Coordination   CSW contacted the [facility] office to determine if patient has been appointed a guardian. Dagoberto Reef of Court representative confirms there are no guardianship orders listed using "[name]" or "[name]."    Signed:  Corky Crafts, MSW, LCSW, LCAS 08/23/2022 3:30 PM

## 2022-08-23 NOTE — Group Note (Signed)
Recreation Therapy Group Note   Group Topic:Leisure Education  Group Date: 08/23/2022 Start Time: 1400 End Time: 1445 Facilitators: Rosina Lowenstein, LRT, CTRS Location: Courtyard  Group Description: Leisure. Patients were given the option to choose from playing corn hole, playing with a deck or cards or listening to music. LRT and pts discussed the meaning of leisure, the importance of participating in leisure during their free time/when they're outside of the hospital, as well as how our leisure interests can also serve as coping skills. Pt identified two leisure interests and shared with the group.   Goal Area(s) Addressed:  Patient will identify a current leisure interest.  Patient will learn the definition of "leisure". Patient will practice making a positive decision. Patient will have the opportunity to try a new leisure activity. Patient will communicate with peers and LRT.   Affect/Mood: Labile   Participation Level: Hyperverbal   Participation Quality: Minimal Cues   Behavior: Bizarre, Distracted, Disinterested, and Hallucinating   Speech/Thought Process: Delusional and Flight of ideas   Insight: Poor   Judgement: Poor   Modes of Intervention: Activity   Patient Response to Interventions:  Disengaged   Education Outcome:  In group clarification offered    Clinical Observations/Individualized Feedback: Tawney was minimally active in their participation of session activities and group discussion. Pt came out to the courtyard late. Pt chose not to sit with LRT and peers at the picnic table. Pt briefly sat on the bench alone before getting up and wandering around the courtyard. Pt was observed picking up grass, rocks, and wild strawberries up off of the ground while outside. Pt was seen digging in the crevices of the cement with her fingers. Pt poured her tea into the grass, along with the tea bag, but then picked the tea bag back up and put it back into her cup. Pt was  seen pressing the cement with her finger as if it were some sort of button. Pt was hyper-verbal and mumbling profanity duration of session. Pt followed redirected from LRT when it was time to come inside. LRT had eyes on pt the whole time to ensure she did not bring any materials inside from the courtyard or eat anything she was not suppose to. While outside, pt asked LRT if pts were allowed to smoke a cigarette. Pt was receptive to LRT's response.  Plan: Continue to engage patient in RT group sessions 2-3x/week.   Rosina Lowenstein, LRT, CTRS 08/23/2022 3:03 PM

## 2022-08-23 NOTE — BH Assessment (Signed)
Recreation Therapy Notes  INPATIENT RECREATION THERAPY ASSESSMENT  Patient Details Name: Trea Chrisp MRN: [id number] DOB: Jul 10, 1960 Today's Date: 08/23/2022    Able to Participate in Assessment/Interview: No    Salina April LRT, CTRS Bryer Cozzolino Abner Greenspan 08/23/2022, 3:22 PM

## 2022-08-23 NOTE — H&P (Signed)
Psychiatric Admission Assessment Adult  Patient Identification: Betty Henderson MRN:  [id number] Date of Evaluation:  08/23/2022 Chief Complaint:  Schizoaffective disorder, bipolar type (HCC) [F25.0] Principal Diagnosis: Schizoaffective disorder, bipolar type (HCC) Diagnosis:  Principal Problem:   Schizoaffective disorder, bipolar type (HCC)  History of Present Illness: Betty Henderson is a 61 year old African-American female who is involuntarily admitted to inpatient psychiatry for worsening psychosis.  She presented to [facility] approximately 5 days ago and then was transferred to [facility].  She has a long history of schizoaffective disorder, bipolar type.  She is homeless and presents to the emergency room numerous times.  She was psychotic and aggressive and agitated.  Obviously, she was not taking her medications.  She is a poor historian and difficult to understand.  She was able to tell me that she does have a sister and 2 sons that are truck drivers.  She does not have contact with any of them.  Her psychiatric provider is unknown.  She has 1 psychiatric admission back in 2020 at [facility] [place].  She has been on [place] 234 in the past.  She has been on Zyprexa, Haldol, Risperdal, and Abilify.  She was supposed to follow-up at [facility].  Associated Signs/Symptoms: Depression Symptoms:  anhedonia, weight loss, (Hypo) Manic Symptoms: Unremarkable Anxiety Symptoms:   Unremarkable Psychotic Symptoms:  Delusions, Paranoia, PTSD Symptoms: NA Total Time spent with patient: 1 hour  Past Psychiatric History: Extensive history of paranoid schizophrenia  Is the patient at risk to self? Yes.    Has the patient been a risk to self in the past 6 months? Yes.    Has the patient been a risk to self within the distant past? Yes.    Is the patient a risk to others? No.  Has the patient been a risk to others in the past 6 months? No.  Has the patient been a risk to others within the  distant past? No.   [place] Scale:  Flowsheet Row Admission (Current) from 08/22/2022 in [facility] BEHAVIORAL MEDICINE ED from 08/17/2022 in [facility] Emergency Department at [facility] ED from 08/12/2022 in [facility] Emergency Department at [facility]  C-SSRS RISK CATEGORY No Risk No Risk No Risk        Prior Inpatient Therapy: Yes.    Prior Outpatient Therapy: Yes.      Alcohol Screening: 1. How often do you have a drink containing alcohol?: Never 2. How many drinks containing alcohol do you have on a typical day when you are drinking?: 1 or 2 3. How often do you have six or more drinks on one occasion?: Never AUDIT-C Score: 0 4. How often during the last year have you found that you were not able to stop drinking once you had started?: Never 5. How often during the last year have you failed to do what was normally expected from you because of drinking?: Never 6. How often during the last year have you needed a first drink in the morning to get yourself going after a heavy drinking session?: Never 7. How often during the last year have you had a feeling of guilt of remorse after drinking?: Never 8. How often during the last year have you been unable to remember what happened the night before because you had been drinking?: Never 9. Have you or someone else been injured as a result of your drinking?: No 10. Has a relative or friend or a doctor or another Medical laboratory scientific officer  been concerned about your drinking or suggested you cut down?: No Alcohol Use Disorder Identification Test Final Score (AUDIT): 0 Alcohol Brief Interventions/Follow-up: Alcohol education/Brief advice Substance Abuse History in the last 12 months:  No. Consequences of Substance Abuse: NA Previous Psychotropic Medications: Yes  Psychological Evaluations: Yes  Past Medical History:  Past Medical History:  Diagnosis Date   Anemia    Arthritis    Chronic pain    Drug-seeking behavior     Malingering    Osteopetrosis    Psychosis (HCC)    Schizoaffective disorder, bipolar type (HCC)     Past Surgical History:  Procedure Laterality Date   MOUTH SURGERY     TUBAL LIGATION     Family History:  Family History  Family history unknown: Yes   Family Psychiatric  History: Unremarkable Tobacco Screening:  Social History   Tobacco Use  Smoking Status Every Day   Packs/day: .5   Types: Cigarettes  Smokeless Tobacco Never    BH Tobacco Counseling     Are you interested in Tobacco Cessation Medications?  No, patient refused Counseled patient on smoking cessation:  Yes Reason Tobacco Screening Not Completed: No value filed.       Social History:  Social History   Substance and Sexual Activity  Alcohol Use Not Currently   Comment: 1 cocktail 3 weeks ago     Social History   Substance and Sexual Activity  Drug Use Not Currently   Types: Cocaine   Comment: states "it's legal though"    Additional Social History:                           Allergies:   Allergies  Allergen Reactions   Other Itching    PT reports having environmental allergies which she used to receive shots for, but cannot specify exact allergens   Lemon Oil Rash   Proanthocyanidin Rash   Strawberry Extract Rash   Adhesive  [Tape] Rash   Cherry Rash   Wound Dressing Adhesive Rash   Lab Results: No results found for this or any previous visit (from the past 48 hour(s)).  Blood Alcohol level:  Lab Results  Component Value Date   ETH <10 08/17/2022   ETH <10 01/11/2021    Metabolic Disorder Labs:  No results found for: "HGBA1C", "MPG" No results found for: "PROLACTIN" No results found for: "CHOL", "TRIG", "HDL", "CHOLHDL", "VLDL", "LDLCALC"  Current Medications: Current Facility-Administered Medications  Medication Dose Route Frequency Provider Last Rate Last Admin   acetaminophen (TYLENOL) tablet 650 mg  650 mg Oral Q6H PRN [name], DO       alum &  mag hydroxide-simeth (MAALOX/MYLANTA) 200-200-20 MG/5ML suspension 30 mL  30 mL Oral Q4H PRN [name], DO       chlorproMAZINE (THORAZINE) tablet 50 mg  50 mg Oral QHS [name], DO       diphenhydrAMINE (BENADRYL) capsule 50 mg  50 mg Oral Q6H PRN [name], DO       Or   diphenhydrAMINE (BENADRYL) injection 50 mg  50 mg Intramuscular Q6H PRN [name], DO       haloperidol (HALDOL) tablet 5 mg  5 mg Oral Q6H PRN [name], DO       Or   haloperidol lactate (HALDOL) injection 5 mg  5 mg Intramuscular Q6H PRN [name], DO       LORazepam (ATIVAN)  tablet 1 mg  1 mg Oral TID PRN [name], DO   1 mg at 08/22/22 2004   magnesium hydroxide (MILK OF MAGNESIA) suspension 30 mL  30 mL Oral Daily PRN [name], DO       OLANZapine [facility]) tablet 7.5 mg  7.5 mg Oral [id number] [name], DO       traZODone (DESYREL) tablet 50 mg  50 mg Oral QHS PRN [name], DO       PTA Medications: Medications Prior to Admission  Medication Sig Dispense Refill Last Dose   ondansetron (ZOFRAN) 4 MG tablet Take 1 tablet (4 mg total) by mouth every 8 (eight) hours as needed for nausea or vomiting. (Patient not taking: Reported on 08/17/2022) 20 tablet 0     Musculoskeletal: Strength & Muscle Tone: within normal limits Gait & Station: normal Patient leans: N/A            Psychiatric Specialty Exam:  Presentation  General Appearance:  Disheveled (Atypical interpersonal style)  Eye Contact: Other (comment) (Variable to brief)  Speech: Other (comment) (Improved, mostly clear, intermittently garbled)  Speech Volume: Normal  Handedness: Right   Mood and Affect  Mood: -- ("Good")  Affect: Other (comment) (Neutral if mildly constricted)   Thought Process  Thought Processes: Other (comment) (superficially linear, concrete)  Duration of Psychotic  Symptoms:N/A Past Diagnosis of Schizophrenia or Psychoactive disorder: No data recorded Descriptions of Associations:Intact  Orientation:Other (comment) (Grossly intact)  Thought Content:Other (comment) (Superficially logical, short)  Hallucinations:No data recorded Ideas of Reference:None  Suicidal Thoughts:No data recorded Homicidal Thoughts:No data recorded  Sensorium  Memory: Immediate Poor; Remote Poor  Judgment: Impaired  Insight: Poor   Executive Functions  Concentration: Poor  Attention Span: Poor  Recall: Poor  Fund of Knowledge: Poor  Language: Fair   Psychomotor Activity  Psychomotor Activity:No data recorded  Assets  Assets: Physical Health; Desire for Improvement   Sleep  Sleep:No data recorded   Physical Exam: Physical Exam Constitutional:      Appearance: Normal appearance.  HENT:     Head: Normocephalic and atraumatic.     Mouth/Throat:     Pharynx: Oropharynx is clear.  Eyes:     Pupils: Pupils are equal, round, and reactive to light.  Cardiovascular:     Rate and Rhythm: Normal rate and regular rhythm.  Pulmonary:     Effort: Pulmonary effort is normal.     Breath sounds: Normal breath sounds.  Abdominal:     General: Abdomen is flat.     Palpations: Abdomen is soft.  Musculoskeletal:        General: Normal range of motion.  Skin:    General: Skin is warm and dry.  Neurological:     General: No focal deficit present.     Mental Status: She is alert. Mental status is at baseline.  Psychiatric:        Attention and Perception: Attention and perception normal.        Mood and Affect: Mood is anxious. Affect is flat and inappropriate.        Speech: She is noncommunicative.        Behavior: Behavior is agitated. Behavior is cooperative.        Thought Content: Thought content is paranoid.        Cognition and Memory: Memory normal. Cognition is impaired.        Judgment: Judgment is impulsive and inappropriate.     Review of Systems  Constitutional: Negative.   HENT: Negative.    Eyes: Negative.   Respiratory: Negative.    Cardiovascular: Negative.   Gastrointestinal: Negative.   Genitourinary: Negative.   Musculoskeletal: Negative.   Skin: Negative.   Neurological: Negative.   Endo/Heme/Allergies: Negative.   Psychiatric/Behavioral: Negative.     Blood pressure 107/65, pulse 80, temperature 98 F (36.7 C), resp. rate 15, height 5\' 6"  (1.676 m), weight 55.3 kg, SpO2 100 %. Body mass index is 19.69 kg/m.  Treatment Plan Summary: Daily contact with patient to assess and evaluate symptoms and progress in treatment, Medication management, and Plan see orders  Observation Level/Precautions:  15 minute checks  Laboratory:  CBC Chemistry Profile  Psychotherapy:    Medications:    Consultations:    Discharge Concerns:    Estimated LOS:  Other:     Physician Treatment Plan for Primary Diagnosis: Schizoaffective disorder, bipolar type (HCC) Long Term Goal(s): Improvement in symptoms so as ready for discharge  Short Term Goals: Ability to identify changes in lifestyle to reduce recurrence of condition will improve, Ability to verbalize feelings will improve, Ability to disclose and discuss suicidal ideas, Ability to demonstrate self-control will improve, Ability to identify and develop effective coping behaviors will improve, Ability to maintain clinical measurements within normal limits will improve, Compliance with prescribed medications will improve, and Ability to identify triggers associated with substance abuse/mental health issues will improve  Physician Treatment Plan for Secondary Diagnosis: Principal Problem:   Schizoaffective disorder, bipolar type (HCC)   I certify that inpatient services furnished can reasonably be expected to improve the patient's condition.    [name], DO 5/31/202412:38 PM

## 2022-08-23 NOTE — BHH Counselor (Signed)
Adult Comprehensive Assessment  Patient ID: Betty Henderson, female   DOB: [birth year], 62 y.o.   MRN: [id number]  Information Source: Information source: Patient  Current Stressors:  Patient states their primary concerns and needs for treatment are:: During assessment, pateint is very dificult to understand, tangential and word salad at times. 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 Patient is a poor historian and answers given should not be considered acurate. Patient states their goals for this hospitilization and ongoing recovery are:: Patient unable to identify a goal for hospitalization due to acute psychotic features. 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 [employer] Physical health (include injuries & life threatening diseases): patient looks in poor health due to chronic homelessness, though has no complaints Social relationships: patient has little social supports Substance abuse: patient denies Bereavement / Loss: none reported  Living/Environment/Situation:  Living Arrangements: Alone Living conditions (as described by patient or guardian): patient is homeless Who else lives in the home?: n/a How long has patient lived in current situation?: unknown What is atmosphere in current home: Chaotic, Temporary  Family History:  Marital status: Widowed Widowed, when?: 9 years Are you sexually active?: No Does patient have children?: Yes How many children?: 3 How is patient's relationship with their children?: no relationship with adult sons  Childhood History:  By whom was/is the patient raised?:  Mother Description of patient's relationship with caregiver when they were a child: unable to assess Patient's description of current relationship with people who raised him/her: both parents are deceased Does patient have siblings?: Yes Number of Siblings:  (unknown) Did patient suffer any verbal/emotional/physical/sexual abuse as a child?: No Did patient suffer from severe childhood neglect?: No Has patient ever been sexually abused/assaulted/raped as an adolescent or adult?: No Was the patient ever a victim of a crime or a disaster?: No Witnessed domestic violence?: No Has patient been affected by domestic violence as an adult?: No  Education:  Highest grade of school patient has completed: HS Diploma Currently a student?: No Learning disability?: No  Employment/Work Situation:   Employment Situation:  (unable to assess, 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?: No  Financial Resources:   Financial resources: No income Does patient have a Lawyer or guardian?:  (unknown)  Alcohol/Substance Abuse:   Social History   Substance and Sexual Activity  Alcohol Use Not Currently   Comment: 1 cocktail 3 weeks ago   Social History   Substance and Sexual Activity  Drug Use Not Currently   Types: Cocaine   Comment: states "it's legal though"   Tobacco Use: High Risk (08/22/2022)   Patient History    Smoking Tobacco Use: Every Day    Smokeless Tobacco Use: Never    Passive Exposure: Not on file  Drugs of Abuse     Component Value Date/Time   LABOPIA NONE DETECTED 08/17/2022 1650  COCAINSCRNUR NONE DETECTED 08/17/2022 1650   LABBENZ NONE  DETECTED 08/17/2022 1650   AMPHETMU NONE DETECTED 08/17/2022 1650   THCU NONE DETECTED 08/17/2022 1650   LABBARB NONE DETECTED 08/17/2022 1650    What has been your use of drugs/alcohol within the last 12 months?: patient denies Alcohol/Substance Abuse Treatment Hx: Denies past history Has alcohol/substance abuse ever caused legal problems?: No  Social Support System:   Forensic psychologist System: None Type of faith/religion: unable to assess  Leisure/Recreation:   Do You Have Hobbies?: No  Strengths/Needs:   Patient states these barriers may affect/interfere with their treatment: none reported Patient states these barriers may affect their return to the community: none reported Other important information patient would like considered in planning for their treatment: none reported  Discharge Plan:   Currently receiving community mental health services: No (states she uses the ED to refill her medications) Does patient have access to transportation?: No Does patient have financial barriers related to discharge medications?: Yes (no insurance listed) Will patient be returning to same living situation after discharge?:  (TBD)  Summary/Recommendations:   Summary and Recommendations (to be completed by the evaluator): 62 y/o female w/ dxo f Schizoaffective d/o from [facility] w/ no listed insurance admitted due to eratic behaviors and delusions of grandeur. During assessment, pateint is very dificult to understand, tangential and word salad at times. 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 Patient is a poor historian and answers given should not be considered acurate. Patient unable to identify a goal for hospitalization due to acute psychotic features. Therapeutic recommendations include further crisis stabilization, medication management, group therapy, and case  management.  Corky Crafts. 08/23/2022

## 2022-08-23 NOTE — Progress Notes (Signed)
Patient was seen getting agitated in dayroom, talking aggressively and cursing at staff. Patient received medications and was redirected to her room.

## 2022-08-23 NOTE — BHH Suicide Risk Assessment (Signed)
[facility] Admission Suicide Risk Assessment   Nursing information obtained from:  Patient Demographic factors:  Low socioeconomic status, Living alone, Unemployed Current Mental Status:  NA Loss Factors:  NA Historical Factors:  NA Risk Reduction Factors:  Religious beliefs about death  Total Time spent with patient: 1 hour Principal Problem: Schizoaffective disorder, bipolar type (HCC) Diagnosis:  Principal Problem:   Schizoaffective disorder, bipolar type (HCC)  Subjective Data:  Betty Henderson is a 62 year old African-American female who is involuntarily admitted to inpatient psychiatry for worsening psychosis.  She presented to [facility] approximately 5 days ago and then was transferred to [facility].  She has a long history of schizoaffective disorder, bipolar type.  She is homeless and presents to the emergency room numerous times.  She was psychotic and aggressive and agitated.  Obviously, she was not taking her medications.  She is a poor historian and difficult to understand.  She was able to tell me that she does have a sister and 2 sons that are truck drivers.  She does not have contact with any of them.  Her psychiatric provider is unknown.  She has 1 psychiatric admission back in 2020 at [place] [facility] [place].  She has been on [place] 234 in the past.  She has been on Zyprexa, Haldol, Risperdal, and Abilify.  She was supposed to follow-up at [facility].   Continued Clinical Symptoms:  Alcohol Use Disorder Identification Test Final Score (AUDIT): 0 The "Alcohol Use Disorders Identification Test", Guidelines for Use in Primary Care, Second Edition.  World Science writer [facility]). Score between 0-7:  no or low risk or alcohol related problems. Score between 8-15:  moderate risk of alcohol related problems. Score between 16-19:  high risk of alcohol related problems. Score 20 or above:  warrants further diagnostic evaluation for alcohol dependence and treatment.   CLINICAL FACTORS:    Schizophrenia:   Paranoid or undifferentiated type   Musculoskeletal: Strength & Muscle Tone: within normal limits Gait & Station: normal Patient leans: N/A  Psychiatric Specialty Exam:  Presentation  General Appearance:  Disheveled (Atypical interpersonal style)  Eye Contact: Other (comment) (Variable to brief)  Speech: Other (comment) (Improved, mostly clear, intermittently garbled)  Speech Volume: Normal  Handedness: Right   Mood and Affect  Mood: -- ("Good")  Affect: Other (comment) (Neutral if mildly constricted)   Thought Process  Thought Processes: Other (comment) (superficially linear, concrete)  Descriptions of Associations:Intact  Orientation:Other (comment) (Grossly intact)  Thought Content:Other (comment) (Superficially logical, short)  History of Schizophrenia/Schizoaffective disorder:No data recorded Duration of Psychotic Symptoms:No data recorded Hallucinations:No data recorded Ideas of Reference:None  Suicidal Thoughts:No data recorded Homicidal Thoughts:No data recorded  Sensorium  Memory: Immediate Poor; Remote Poor  Judgment: Impaired  Insight: Poor   Executive Functions  Concentration: Poor  Attention Span: Poor  Recall: Poor  Fund of Knowledge: Poor  Language: Fair   Psychomotor Activity  Psychomotor Activity:No data recorded  Assets  Assets: Physical Health; Desire for Improvement   Sleep  Sleep:No data recorded   Blood pressure 107/65, pulse 80, temperature 98 F (36.7 C), resp. rate 15, height 5\' 6"  (1.676 m), weight 55.3 kg, SpO2 100 %. Body mass index is 19.69 kg/m.   COGNITIVE FEATURES THAT CONTRIBUTE TO RISK:  Thought constriction (tunnel vision)    SUICIDE RISK:   Minimal: No identifiable suicidal ideation.  Patients presenting with no risk factors but with morbid ruminations; may be classified as minimal risk based on the severity of the depressive symptoms  PLAN  OF CARE: See  orders  I certify that inpatient services furnished can reasonably be expected to improve the patient's condition.   [name], DO 08/23/2022, 12:52 PM

## 2022-08-23 NOTE — BHH Suicide Risk Assessment (Signed)
BHH INPATIENT:  Family/Significant Other Suicide Prevention Education  Suicide Prevention Education:  Patient Refusal for Family/Significant Other Suicide Prevention Education: The patient Betty Henderson has refused to provide written consent for family/significant other to be provided Family/Significant Other Suicide Prevention Education during admission and/or prior to discharge.  Physician notified.  Corky Crafts 08/23/2022, 2:45 PM

## 2022-08-23 NOTE — BHH Group Notes (Signed)
BHH Group Notes:  (Nursing/MHT/Case Management/Adjunct)  Date:  08/23/2022  Time:  10:14 AM  Type of Therapy:  community meeting  Participation Level:  Did Not Attend    Betty Henderson 08/23/2022, 10:14 AM

## 2022-08-23 NOTE — BH IP Treatment Plan (Signed)
Interdisciplinary Treatment and Diagnostic Plan Update  08/23/2022 Time of Session: 10:00AM Betty Henderson MRN: [id number]  Principal Diagnosis: Schizoaffective disorder, bipolar type [facility])  Secondary Diagnoses: Principal Problem:   Schizoaffective disorder, bipolar type (HCC)   Current Medications:  Current Facility-Administered Medications  Medication Dose Route Frequency Provider Last Rate Last Admin   acetaminophen (TYLENOL) tablet 650 mg  650 mg Oral Q6H PRN [name], DO       alum & mag hydroxide-simeth (MAALOX/MYLANTA) 200-200-20 MG/5ML suspension 30 mL  30 mL Oral Q4H PRN [name], DO       LORazepam (ATIVAN) tablet 1 mg  1 mg Oral TID PRN [name], DO   1 mg at 08/22/22 2004   magnesium hydroxide (MILK OF MAGNESIA) suspension 30 mL  30 mL Oral Daily PRN [name], DO       OLANZapine (ZYPREXA) tablet 10 mg  10 mg Oral QHS [name], DO   10 mg at 08/22/22 2120   OLANZapine zydis (ZYPREXA) disintegrating tablet 5 mg  5 mg Oral Q8H PRN [name], DO   5 mg at 08/22/22 1714   And   ziprasidone (GEODON) injection 20 mg  20 mg Intramuscular Q12H PRN [name], DO       traZODone (DESYREL) tablet 100 mg  100 mg Oral QHS PRN [name], DO   100 mg at 08/22/22 2004   PTA Medications: Medications Prior to Admission  Medication Sig Dispense Refill Last Dose   ondansetron (ZOFRAN) 4 MG tablet Take 1 tablet (4 mg total) by mouth every 8 (eight) hours as needed for nausea or vomiting. (Patient not taking: Reported on 08/17/2022) 20 tablet 0     Patient Stressors: Financial difficulties   Health problems   Medication change or noncompliance    Patient Strengths: Ability for insight   Treatment Modalities: Medication Management, Group therapy, Case management,  1 to 1 session with clinician, Psychoeducation, Recreational therapy.   Physician Treatment Plan for  Primary Diagnosis: Schizoaffective disorder, bipolar type (HCC) Long Term Goal(s):     Short Term Goals:    Medication Management: Evaluate patient's response, side effects, and tolerance of medication regimen.  Therapeutic Interventions: 1 to 1 sessions, Unit Group sessions and Medication administration.  Evaluation of Outcomes: Not Met  Physician Treatment Plan for Secondary Diagnosis: Principal Problem:   Schizoaffective disorder, bipolar type (HCC)  Long Term Goal(s):     Short Term Goals:       Medication Management: Evaluate patient's response, side effects, and tolerance of medication regimen.  Therapeutic Interventions: 1 to 1 sessions, Unit Group sessions and Medication administration.  Evaluation of Outcomes: Not Met   RN Treatment Plan for Primary Diagnosis: Schizoaffective disorder, bipolar type (HCC) Long Term Goal(s): Knowledge of disease and therapeutic regimen to maintain health will improve  Short Term Goals: Ability to verbalize frustration and anger appropriately will improve, Ability to demonstrate self-control, Ability to participate in decision making will improve, Ability to verbalize feelings will improve, Ability to disclose and discuss suicidal ideas, Ability to identify and develop effective coping behaviors will improve, and Compliance with prescribed medications will improve  Medication Management: RN will administer medications as ordered by provider, will assess and evaluate patient's response and provide education to patient for prescribed medication. RN will report any adverse and/or side effects to prescribing provider.  Therapeutic Interventions: 1 on 1 counseling sessions, Psychoeducation, Medication administration, Evaluate responses to treatment, Monitor vital signs  and CBGs as ordered, Perform/monitor CIWA, COWS, AIMS and Fall Risk screenings as ordered, Perform wound care treatments as ordered.  Evaluation of Outcomes: Not Met   LCSW  Treatment Plan for Primary Diagnosis: Schizoaffective disorder, bipolar type (HCC) Long Term Goal(s): Safe transition to appropriate next level of care at discharge, Engage patient in therapeutic group addressing interpersonal concerns.  Short Term Goals: Engage patient in aftercare planning with referrals and resources, Increase social support, Increase ability to appropriately verbalize feelings, Increase emotional regulation, Facilitate acceptance of mental health diagnosis and concerns, Facilitate patient progression through stages of change regarding substance use diagnoses and concerns, Identify triggers associated with mental health/substance abuse issues, and Increase skills for wellness and recovery  Therapeutic Interventions: Assess for all discharge needs, 1 to 1 time with Social worker, Explore available resources and support systems, Assess for adequacy in community support network, Educate family and significant other(s) on suicide prevention, Complete Psychosocial Assessment, Interpersonal group therapy.  Evaluation of Outcomes: Not Met   Progress in Treatment: Attending groups: No. Participating in groups: No. Taking medication as prescribed: Yes. Toleration medication: Yes. Family/Significant other contact made: No, will contact:  once permission is given. Patient understands diagnosis: No. Discussing patient identified problems/goals with staff: Yes. Medical problems stabilized or resolved: Yes. Denies suicidal/homicidal ideation: Yes. Issues/concerns per patient self-inventory: No. Other: none  New problem(s) identified: No, Describe:  none  New Short Term/Long Term Goal(s): detox, elimination of symptoms of psychosis, medication management for mood stabilization; elimination of SI thoughts; development of comprehensive mental wellness/sobriety plan.   Patient Goals:  Patient was present in group. Patient too disorganized to provide a goal at this time.   Discharge  Plan or Barriers: CSW to assist patient in development of appropriate discharge plans.    Reason for Continuation of Hospitalization: Aggression Delusions  Depression Hallucinations Medication stabilization  Estimated Length of Stay:  Last 3 [place] Suicide Severity Risk Score: Flowsheet Row Admission (Current) from 08/22/2022 in [facility] ED from 08/17/2022 in [facility] Emergency Department at [facility] ED from 08/12/2022 in [facility] Emergency Department at [facility]  C-SSRS RISK CATEGORY No Risk No Risk No Risk       Last [place] 2/9 Scores:     No data to display          Scribe for Treatment Team: Harden Mo, LCSW 08/23/2022 11:14 AM

## 2022-08-23 NOTE — Plan of Care (Signed)
  Problem: Education: Goal: Knowledge of General Education information will improve Description: Including pain rating scale, medication(s)/side effects and non-pharmacologic comfort measures Outcome: Progressing   Problem: Nutrition: Goal: Adequate nutrition will be maintained Outcome: Progressing   Problem: Safety: Goal: Ability to remain free from injury will improve Outcome: Progressing   

## 2022-08-24 DIAGNOSIS — F250 Schizoaffective disorder, bipolar type: Secondary | ICD-10-CM | POA: Diagnosis not present

## 2022-08-24 NOTE — Group Note (Signed)
Date:  08/24/2022 Time:  7:10 PM  Group Topic/Focus:  Rec Group   The focus of this group is to let the patients go out and get fresh air and listen to music of their choices.     Participation Level:  None  Participation Quality:  Inattentive  Affect:  Flat, Irritable, and Not Congruent  Cognitive:  Disorganized, Confused, Delusional, and Hallucinating  Insight: Lacking and None  Engagement in Group:  None  Modes of Intervention:  Activity  Additional Comments:    [name] 08/24/2022, 7:10 PM

## 2022-08-24 NOTE — Progress Notes (Signed)
[facility] MD Progress Note  08/24/2022 5:20 PM Betty Henderson  MRN:  [id number] Subjective: Betty Henderson is seen on rounds.  Nurses report that she has been compliant with medications.  She is still disorganized but compliant with medicines.  No side effects. Principal Problem: Schizoaffective disorder, bipolar type (HCC) Diagnosis: Principal Problem:   Schizoaffective disorder, bipolar type (HCC)  Total Time spent with patient: 15 minutes  Past Psychiatric History: Paranoid schizophrenia  Past Medical History:  Past Medical History:  Diagnosis Date   Anemia    Arthritis    Chronic pain    Drug-seeking behavior    Malingering    Osteopetrosis    Psychosis (HCC)    Schizoaffective disorder, bipolar type (HCC)     Past Surgical History:  Procedure Laterality Date   MOUTH SURGERY     TUBAL LIGATION     Family History:  Family History  Family history unknown: Yes   Family Psychiatric  History: Unremarkable Social History:  Social History   Substance and Sexual Activity  Alcohol Use Not Currently   Comment: 1 cocktail 3 weeks ago     Social History   Substance and Sexual Activity  Drug Use Not Currently   Types: Cocaine   Comment: states "it's legal though"    Social History   Socioeconomic History   Marital status: Single    Spouse name: Not on file   Number of children: Not on file   Years of education: Not on file   Highest education level: Not on file  Occupational History   Not on file  Tobacco Use   Smoking status: Every Day    Packs/day: .5    Types: Cigarettes   Smokeless tobacco: Never  Vaping Use   Vaping Use: Never used  Substance and Sexual Activity   Alcohol use: Not Currently    Comment: 1 cocktail 3 weeks ago   Drug use: Not Currently    Types: Cocaine    Comment: states "it's legal though"   Sexual activity: Never  Other Topics Concern   Not on file  Social History Narrative   Not on file   Social Determinants of Health   Financial  Resource Strain: Not on file  Food Insecurity: Food Insecurity Present (08/22/2022)   Hunger Vital Sign    Worried About Running Out of Food in the Last Year: Sometimes true    Ran Out of Food in the Last Year: Sometimes true  Transportation Needs: Patient Unable To Answer (08/22/2022)   PRAPARE - Administrator, Civil Service (Medical): Patient unable to answer    Lack of Transportation (Non-Medical): Patient unable to answer  Physical Activity: Not on file  Stress: Not on file  Social Connections: Not on file   Additional Social History:                         Sleep: Good  Appetite:  Good  Current Medications: Current Facility-Administered Medications  Medication Dose Route Frequency Provider Last Rate Last Admin   acetaminophen (TYLENOL) tablet 650 mg  650 mg Oral Q6H PRN [name], DO   650 mg at 08/24/22 0110   alum & mag hydroxide-simeth (MAALOX/MYLANTA) 200-200-20 MG/5ML suspension 30 mL  30 mL Oral Q4H PRN [name], DO       chlorproMAZINE (THORAZINE) tablet 50 mg  50 mg Oral QHS [name], DO   50 mg at 08/23/22 2102  diphenhydrAMINE (BENADRYL) capsule 50 mg  50 mg Oral Q6H PRN [name], DO   50 mg at 08/24/22 1252   Or   diphenhydrAMINE (BENADRYL) injection 50 mg  50 mg Intramuscular Q6H PRN [name], DO       haloperidol (HALDOL) tablet 5 mg  5 mg Oral Q6H PRN [name], DO   5 mg at 08/24/22 1252   Or   haloperidol lactate (HALDOL) injection 5 mg  5 mg Intramuscular Q6H PRN [name], DO       LORazepam (ATIVAN) tablet 1 mg  1 mg Oral TID PRN [name], DO   1 mg at 08/23/22 2213   magnesium hydroxide (MILK OF MAGNESIA) suspension 30 mL  30 mL Oral Daily PRN [name], DO       OLANZapine [facility]) tablet 7.5 mg  7.5 mg Oral BH-q8a4p [name], DO   7.5 mg at 08/24/22 1517   traZODone (DESYREL) tablet 50 mg   50 mg Oral QHS PRN [name], DO   50 mg at 08/23/22 2102    Lab Results: No results found for this or any previous visit (from the past 48 hour(s)).  Blood Alcohol level:  Lab Results  Component Value Date   ETH <10 08/17/2022   ETH <10 01/11/2021    Metabolic Disorder Labs: No results found for: "HGBA1C", "MPG" No results found for: "PROLACTIN" No results found for: "CHOL", "TRIG", "HDL", "CHOLHDL", "VLDL", "LDLCALC"  Physical Findings: AIMS:  , ,  ,  ,    CIWA:    COWS:     Musculoskeletal: Strength & Muscle Tone: within normal limits Gait & Station: normal Patient leans: N/A  Psychiatric Specialty Exam:  Presentation  General Appearance:  Disheveled (Atypical interpersonal style)  Eye Contact: Other (comment) (Variable to brief)  Speech: Other (comment) (Improved, mostly clear, intermittently garbled)  Speech Volume: Normal  Handedness: Right   Mood and Affect  Mood: -- ("Good")  Affect: Other (comment) (Neutral if mildly constricted)   Thought Process  Thought Processes: Other (comment) (superficially linear, concrete)  Descriptions of Associations:Intact  Orientation:Other (comment) (Grossly intact)  Thought Content:Other (comment) (Superficially logical, short)  History of Schizophrenia/Schizoaffective disorder:No data recorded Duration of Psychotic Symptoms:No data recorded Hallucinations:No data recorded Ideas of Reference:None  Suicidal Thoughts:No data recorded Homicidal Thoughts:No data recorded  Sensorium  Memory: Immediate Poor; Remote Poor  Judgment: Impaired  Insight: Poor   Executive Functions  Concentration: Poor  Attention Span: Poor  Recall: Poor  Fund of Knowledge: Poor  Language: Fair   Psychomotor Activity  Psychomotor Activity:No data recorded  Assets  Assets: Physical Health; Desire for Improvement   Sleep  Sleep:No data recorded    Blood pressure 112/75, pulse 81,  temperature (!) 97.3 F (36.3 C), resp. rate 15, height 5\' 6"  (1.676 m), weight 55.3 kg, SpO2 100 %. Body mass index is 19.69 kg/m.   Treatment Plan Summary: Daily contact with patient to assess and evaluate symptoms and progress in treatment, Medication management, and Plan continue current medications.  Japleen Tornow Tresea Mall, DO 08/24/2022, 5:20 PM

## 2022-08-24 NOTE — Progress Notes (Signed)
   08/24/22 0646  Vitals  BP (!) 84/75  MAP (mmHg) 80  BP Location Right Arm  BP Method Automatic  Patient Position (if appropriate) Sitting  Pulse Rate 74  Pulse Rate Source Monitor  MEWS COLOR  MEWS Score Color Green  Oxygen Therapy  SpO2 100 %  MEWS Score  MEWS Temp 0  MEWS Systolic 1  MEWS Pulse 0  MEWS RR 0  MEWS LOC 0  MEWS Score 1   Patient manual BP obtained, 100/62. Patient denies associated cardiac symptoms at this time, no acute distress noted. Patient alert and watching tv in the dayroom at this time. No complaints at this time. Patient remains safe on the unit. Q15 minute safety checks in place.

## 2022-08-24 NOTE — Group Note (Signed)
Date:  08/24/2022 Time:  10:55 AM  Group Topic/Focus:  Group Movement-Chair Zumba     Participation Level:  None  Participation Quality:  Inattentive  Affect:  Angry, Irritable, and Not Congruent  Cognitive:  Disorganized, Confused, Delusional, and Hallucinating  Insight: Lacking and Limited  Engagement in Group:  Off Topic and Poor  Modes of Intervention:  Socialization  Additional Comments:    [name] 08/24/2022, 10:55 AM

## 2022-08-24 NOTE — Plan of Care (Signed)
Pt. Alert and oriented to person and time, she continues to be agitated and irritable, she argumentative with the voices. Patient was angered when staff approached to intervene when she became loud, offered medication to decrease agitation and she was complaint. Patient was also compliant with taking her scheduled medications and she have no noted interactions with peers and only interact with staff when prompted. Appetite is good, she ate 100% of her meals and reported sleeping "all night".

## 2022-08-25 DIAGNOSIS — F250 Schizoaffective disorder, bipolar type: Secondary | ICD-10-CM | POA: Diagnosis not present

## 2022-08-25 NOTE — Plan of Care (Signed)
  Problem: Education: Goal: Knowledge of General Education information will improve Description: Including pain rating scale, medication(s)/side effects and non-pharmacologic comfort measures Outcome: Not Progressing   Problem: Health Behavior/Discharge Planning: Goal: Ability to manage health-related needs will improve Outcome: Not Progressing   Problem: Clinical Measurements: Goal: Ability to maintain clinical measurements within normal limits will improve Outcome: Not Progressing Goal: Diagnostic test results will improve Outcome: Not Progressing   Problem: Nutrition: Goal: Adequate nutrition will be maintained Outcome: Not Progressing   Problem: Coping: Goal: Level of anxiety will decrease Outcome: Not Progressing   Problem: Elimination: Goal: Will not experience complications related to bowel motility Outcome: Not Progressing Goal: Will not experience complications related to urinary retention Outcome: Not Progressing   Problem: Pain Managment: Goal: General experience of comfort will improve Outcome: Not Progressing   Problem: Safety: Goal: Ability to remain free from injury will improve Outcome: Not Progressing   Problem: Skin Integrity: Goal: Risk for impaired skin integrity will decrease Outcome: Not Progressing

## 2022-08-25 NOTE — Group Note (Signed)
LCSW Group Therapy Note   Group Date: 08/25/2022 Start Time: 1305 End Time: 1340   Type of Therapy and Topic:  Group Therapy: Fear of Asking For Help  Participation Level:  None   Summary of Patient Progress:  The patient came to group but did not participate. She became disruptive and was escorted out.     Marshell Levan, LCSWA 08/25/2022  3:50 PM

## 2022-08-25 NOTE — Progress Notes (Signed)
   08/25/22 0719  Psych Admission Type (Psych Patients Only)  Admission Status Involuntary  Psychosocial Assessment  Patient Complaints Agitation;Irritability  Eye Contact Poor  Facial Expression Anxious  Affect Labile;Anxious;Blunted  Speech Tangential;Pressured  Interaction No initiation  Motor Activity Restless  Appearance/Hygiene In scrubs  Behavior Characteristics Agitated;Irritable  Mood Anxious;Irritable  Thought Process  Coherency Tangential;Flight of ideas;Loose associations  Content Preoccupation  Delusions None reported or observed  Perception Hallucinations  Hallucination Auditory  Judgment Impaired  Confusion Mild  Danger to Self  Current suicidal ideation? Denies  Danger to Others  Danger to Others None reported or observed

## 2022-08-25 NOTE — Progress Notes (Signed)
   08/25/22 0000  Psych Admission Type (Psych Patients Only)  Admission Status Involuntary  Psychosocial Assessment  Patient Complaints Irritability  Eye Contact Brief  Facial Expression Sad  Affect Blunted;Constricted  Speech Logical/coherent;Pressured;Tangential;Rapid  Interaction Avoidant  Motor Activity Restless  Appearance/Hygiene In scrubs  Behavior Characteristics Agitated;Irritable  Mood Anxious  Aggressive Behavior  Type of Behavior Verbal  Effect No apparent injury  Thought Chartered certified accountant of ideas;Tangential;Loose associations  Content Delusions  Delusions Persecutory  Perception Hallucinations  Hallucination Auditory;Visual  Judgment WDL  Confusion Mild  Danger to Self  Current suicidal ideation? Denies  Danger to Others  Danger to Others None reported or observed

## 2022-08-25 NOTE — Progress Notes (Signed)
[facility] MD Progress Note  08/25/2022 2:42 PM Betty Henderson  MRN:  [id number] Subjective: Betty Henderson is seen on rounds.  Nurses tell me that she is doing better since she has been compliant with medications.  Never tells me she is feeling fine.  She denies any side effects from her medications. Principal Problem: Schizoaffective disorder, bipolar type (HCC) Diagnosis: Principal Problem:   Schizoaffective disorder, bipolar type (HCC)  Total Time spent with patient: 15 minutes  Past Psychiatric History: History of schizoaffective disorder  Past Medical History:  Past Medical History:  Diagnosis Date   Anemia    Arthritis    Chronic pain    Drug-seeking behavior    Malingering    Osteopetrosis    Psychosis (HCC)    Schizoaffective disorder, bipolar type (HCC)     Past Surgical History:  Procedure Laterality Date   MOUTH SURGERY     TUBAL LIGATION     Family History:  Family History  Family history unknown: Yes   Family Psychiatric  History: Unremarkable Social History:  Social History   Substance and Sexual Activity  Alcohol Use Not Currently   Comment: 1 cocktail 3 weeks ago     Social History   Substance and Sexual Activity  Drug Use Not Currently   Types: Cocaine   Comment: states "it's legal though"    Social History   Socioeconomic History   Marital status: Single    Spouse name: Not on file   Number of children: Not on file   Years of education: Not on file   Highest education level: Not on file  Occupational History   Not on file  Tobacco Use   Smoking status: Every Day    Packs/day: .5    Types: Cigarettes   Smokeless tobacco: Never  Vaping Use   Vaping Use: Never used  Substance and Sexual Activity   Alcohol use: Not Currently    Comment: 1 cocktail 3 weeks ago   Drug use: Not Currently    Types: Cocaine    Comment: states "it's legal though"   Sexual activity: Never  Other Topics Concern   Not on file  Social History Narrative   Not on file    Social Determinants of Health   Financial Resource Strain: Not on file  Food Insecurity: Food Insecurity Present (08/22/2022)   Hunger Vital Sign    Worried About Running Out of Food in the Last Year: Sometimes true    Ran Out of Food in the Last Year: Sometimes true  Transportation Needs: Patient Unable To Answer (08/22/2022)   PRAPARE - Administrator, Civil Service (Medical): Patient unable to answer    Lack of Transportation (Non-Medical): Patient unable to answer  Physical Activity: Not on file  Stress: Not on file  Social Connections: Not on file   Additional Social History:                         Sleep: Good  Appetite:  Good  Current Medications: Current Facility-Administered Medications  Medication Dose Route Frequency Provider Last Rate Last Admin   acetaminophen (TYLENOL) tablet 650 mg  650 mg Oral Q6H PRN [name], DO   650 mg at 08/24/22 0110   alum & mag hydroxide-simeth (MAALOX/MYLANTA) 200-200-20 MG/5ML suspension 30 mL  30 mL Oral Q4H PRN [name], DO       chlorproMAZINE (THORAZINE) tablet 50 mg  50 mg Oral QHS Addisson Frate,  Lanny Cramp, DO   50 mg at 08/24/22 2051   diphenhydrAMINE (BENADRYL) capsule 50 mg  50 mg Oral Q6H PRN [name], DO   50 mg at 08/25/22 0813   Or   diphenhydrAMINE (BENADRYL) injection 50 mg  50 mg Intramuscular Q6H PRN [name], DO       haloperidol (HALDOL) tablet 5 mg  5 mg Oral Q6H PRN [name], DO   5 mg at 08/25/22 7846   Or   haloperidol lactate (HALDOL) injection 5 mg  5 mg Intramuscular Q6H PRN [name], DO       LORazepam (ATIVAN) tablet 1 mg  1 mg Oral TID PRN [name], DO   1 mg at 08/25/22 0813   magnesium hydroxide (MILK OF MAGNESIA) suspension 30 mL  30 mL Oral Daily PRN [name], DO       OLANZapine [facility]) tablet 7.5 mg  7.5 mg Oral BH-q8a4p [name], DO   7.5 mg at  08/25/22 0813   traZODone (DESYREL) tablet 50 mg  50 mg Oral QHS PRN [name], DO   50 mg at 08/24/22 2051    Lab Results: No results found for this or any previous visit (from the past 48 hour(s)).  Blood Alcohol level:  Lab Results  Component Value Date   ETH <10 08/17/2022   ETH <10 01/11/2021    Metabolic Disorder Labs: No results found for: "HGBA1C", "MPG" No results found for: "PROLACTIN" No results found for: "CHOL", "TRIG", "HDL", "CHOLHDL", "VLDL", "LDLCALC"  Physical Findings: AIMS:  , ,  ,  ,    CIWA:    COWS:     Musculoskeletal: Strength & Muscle Tone: within normal limits Gait & Station: normal Patient leans: N/A  Psychiatric Specialty Exam:  Presentation  General Appearance:  Disheveled (Atypical interpersonal style)  Eye Contact: Other (comment) (Variable to brief)  Speech: Other (comment) (Improved, mostly clear, intermittently garbled)  Speech Volume: Normal  Handedness: Right   Mood and Affect  Mood: -- ("Good")  Affect: Other (comment) (Neutral if mildly constricted)   Thought Process  Thought Processes: Other (comment) (superficially linear, concrete)  Descriptions of Associations:Intact  Orientation:Other (comment) (Grossly intact)  Thought Content:Other (comment) (Superficially logical, short)  History of Schizophrenia/Schizoaffective disorder:No data recorded Duration of Psychotic Symptoms:No data recorded Hallucinations:No data recorded Ideas of Reference:None  Suicidal Thoughts:No data recorded Homicidal Thoughts:No data recorded  Sensorium  Memory: Immediate Poor; Remote Poor  Judgment: Impaired  Insight: Poor   Executive Functions  Concentration: Poor  Attention Span: Poor  Recall: Poor  Fund of Knowledge: Poor  Language: Fair   Psychomotor Activity  Psychomotor Activity:No data recorded  Assets  Assets: Physical Health; Desire for Improvement   Sleep  Sleep:No data  recorded    Blood pressure 103/68, pulse 82, temperature 98.1 F (36.7 C), resp. rate 18, height 5\' 6"  (1.676 m), weight 55.3 kg, SpO2 100 %. Body mass index is 19.69 kg/m.   Treatment Plan Summary: Daily contact with patient to assess and evaluate symptoms and progress in treatment, Medication management, and Plan continue current medications.  Skylynn Burkley Tresea Mall, DO 08/25/2022, 2:42 PM

## 2022-08-25 NOTE — Group Note (Signed)
Date:  08/25/2022 Time:  9:05 PM  Group Topic/Focus:  Overcoming Stress:   The focus of this group is to define stress and help patients assess their triggers.    Participation Level:  None  Participation Quality:  Inattentive  Affect:   none  Cognitive:  Delusional  Insight: None  Engagement in Group:  None  Modes of Intervention:   none  Additional Comments:    [name] 08/25/2022, 9:05 PM

## 2022-08-26 DIAGNOSIS — F250 Schizoaffective disorder, bipolar type: Secondary | ICD-10-CM | POA: Diagnosis not present

## 2022-08-26 NOTE — Group Note (Addendum)
[facility] LCSW Group Therapy Note    Group Date: 08/26/2022 Start Time: 1300 End Time: 1400  Type of Therapy and Topic:  Group Therapy:  Overcoming Obstacles  Participation Level:  [facility] PARTICIPATION LEVEL: Did Not Attend  Mood:  Description of Group:   In this group patients will be encouraged to explore what they see as obstacles to their own wellness and recovery. They will be guided to discuss their thoughts, feelings, and behaviors related to these obstacles. The group will process together ways to cope with barriers, with attention given to specific choices patients can make. Each patient will be challenged to identify changes they are motivated to make in order to overcome their obstacles. This group will be process-oriented, with patients participating in exploration of their own experiences as well as giving and receiving support and challenge from other group members.  Therapeutic Goals: 1. Patient will identify personal and current obstacles as they relate to admission. 2. Patient will identify barriers that currently interfere with their wellness or overcoming obstacles.  3. Patient will identify feelings, thought process and behaviors related to these barriers. 4. Patient will identify two changes they are willing to make to overcome these obstacles:    Summary of Patient Progress   Patient did not attend group despite encouraged participation.     Therapeutic Modalities:   Cognitive Behavioral Therapy Solution Focused Therapy Motivational Interviewing Relapse Prevention Therapy   Arlin Sass W Nyla Creason, LCSWA 

## 2022-08-26 NOTE — Progress Notes (Signed)
[facility] MD Progress Note  08/26/2022 1:01 PM Betty Henderson  MRN:  [id number] Subjective: Betty Henderson is seen on rounds.  She has been compliant with medications.  No side effects.  She has no complaints.  She continues to be internally preoccupied at times when she is not being engaged. Principal Problem: Schizoaffective disorder, bipolar type (HCC) Diagnosis: Principal Problem:   Schizoaffective disorder, bipolar type (HCC)  Total Time spent with patient: 15 minutes  Past Psychiatric History: Long history of schizophrenia and homelessness  Past Medical History:  Past Medical History:  Diagnosis Date   Anemia    Arthritis    Chronic pain    Drug-seeking behavior    Malingering    Osteopetrosis    Psychosis (HCC)    Schizoaffective disorder, bipolar type (HCC)     Past Surgical History:  Procedure Laterality Date   MOUTH SURGERY     TUBAL LIGATION     Family History:  Family History  Family history unknown: Yes   Family Psychiatric  History: Unremarkable Social History:  Social History   Substance and Sexual Activity  Alcohol Use Not Currently   Comment: 1 cocktail 3 weeks ago     Social History   Substance and Sexual Activity  Drug Use Not Currently   Types: Cocaine   Comment: states "it's legal though"    Social History   Socioeconomic History   Marital status: Single    Spouse name: Not on file   Number of children: Not on file   Years of education: Not on file   Highest education level: Not on file  Occupational History   Not on file  Tobacco Use   Smoking status: Every Day    Packs/day: .5    Types: Cigarettes   Smokeless tobacco: Never  Vaping Use   Vaping Use: Never used  Substance and Sexual Activity   Alcohol use: Not Currently    Comment: 1 cocktail 3 weeks ago   Drug use: Not Currently    Types: Cocaine    Comment: states "it's legal though"   Sexual activity: Never  Other Topics Concern   Not on file  Social History Narrative   Not on  file   Social Determinants of Health   Financial Resource Strain: Not on file  Food Insecurity: Food Insecurity Present (08/22/2022)   Hunger Vital Sign    Worried About Running Out of Food in the Last Year: Sometimes true    Ran Out of Food in the Last Year: Sometimes true  Transportation Needs: Patient Unable To Answer (08/22/2022)   PRAPARE - Administrator, Civil Service (Medical): Patient unable to answer    Lack of Transportation (Non-Medical): Patient unable to answer  Physical Activity: Not on file  Stress: Not on file  Social Connections: Not on file   Additional Social History:                         Sleep: Good  Appetite:  Good  Current Medications: Current Facility-Administered Medications  Medication Dose Route Frequency Provider Last Rate Last Admin   acetaminophen (TYLENOL) tablet 650 mg  650 mg Oral Q6H PRN [name], DO   650 mg at 08/24/22 0110   alum & mag hydroxide-simeth (MAALOX/MYLANTA) 200-200-20 MG/5ML suspension 30 mL  30 mL Oral Q4H PRN [name], DO       chlorproMAZINE (THORAZINE) tablet 50 mg  50 mg Oral QHS Omario Ander,  Lanny Cramp, DO   50 mg at 08/25/22 2113   diphenhydrAMINE (BENADRYL) capsule 50 mg  50 mg Oral Q6H PRN [name], DO   50 mg at 08/26/22 8295   Or   diphenhydrAMINE (BENADRYL) injection 50 mg  50 mg Intramuscular Q6H PRN [name], DO       haloperidol (HALDOL) tablet 5 mg  5 mg Oral Q6H PRN [name], DO   5 mg at 08/26/22 6213   Or   haloperidol lactate (HALDOL) injection 5 mg  5 mg Intramuscular Q6H PRN [name], DO       LORazepam (ATIVAN) tablet 1 mg  1 mg Oral TID PRN [name], DO   1 mg at 08/26/22 0850   magnesium hydroxide (MILK OF MAGNESIA) suspension 30 mL  30 mL Oral Daily PRN [name], DO       OLANZapine [facility]) tablet 7.5 mg  7.5 mg Oral BH-q8a4p [name], DO   7.5 mg  at 08/26/22 0850   traZODone (DESYREL) tablet 50 mg  50 mg Oral QHS PRN [name], DO   50 mg at 08/25/22 2113    Lab Results: No results found for this or any previous visit (from the past 48 hour(s)).  Blood Alcohol level:  Lab Results  Component Value Date   ETH <10 08/17/2022   ETH <10 01/11/2021    Metabolic Disorder Labs: No results found for: "HGBA1C", "MPG" No results found for: "PROLACTIN" No results found for: "CHOL", "TRIG", "HDL", "CHOLHDL", "VLDL", "LDLCALC"  Physical Findings: AIMS:  , ,  ,  ,    CIWA:    COWS:     Musculoskeletal: Strength & Muscle Tone: within normal limits Gait & Station: normal Patient leans: N/A  Psychiatric Specialty Exam:  Presentation  General Appearance:  Disheveled (Atypical interpersonal style)  Eye Contact: Other (comment) (Variable to brief)  Speech: Other (comment) (Improved, mostly clear, intermittently garbled)  Speech Volume: Normal  Handedness: Right   Mood and Affect  Mood: -- ("Good")  Affect: Other (comment) (Neutral if mildly constricted)   Thought Process  Thought Processes: Other (comment) (superficially linear, concrete)  Descriptions of Associations:Intact  Orientation:Other (comment) (Grossly intact)  Thought Content:Other (comment) (Superficially logical, short)  History of Schizophrenia/Schizoaffective disorder:No data recorded Duration of Psychotic Symptoms:No data recorded Hallucinations:No data recorded Ideas of Reference:None  Suicidal Thoughts:No data recorded Homicidal Thoughts:No data recorded  Sensorium  Memory: Immediate Poor; Remote Poor  Judgment: Impaired  Insight: Poor   Executive Functions  Concentration: Poor  Attention Span: Poor  Recall: Poor  Fund of Knowledge: Poor  Language: Fair   Psychomotor Activity  Psychomotor Activity:No data recorded  Assets  Assets: Physical Health; Desire for Improvement   Sleep  Sleep:No  data recorded   Physical Exam: Physical Exam Vitals and nursing note reviewed.  Constitutional:      Appearance: Normal appearance. She is normal weight.  Neurological:     General: No focal deficit present.     Mental Status: She is alert and oriented to person, place, and time.  Psychiatric:        Attention and Perception: Attention normal. She perceives auditory hallucinations.        Mood and Affect: Mood normal. Affect is flat and inappropriate.        Speech: Speech normal.        Behavior: Behavior normal. Behavior is cooperative.        Thought Content:  Thought content is paranoid.        Cognition and Memory: Cognition and memory normal.        Judgment: Judgment normal.    Review of Systems  Constitutional: Negative.   HENT: Negative.    Eyes: Negative.   Respiratory: Negative.    Cardiovascular: Negative.   Gastrointestinal: Negative.   Genitourinary: Negative.   Musculoskeletal: Negative.   Skin: Negative.   Neurological: Negative.   Endo/Heme/Allergies: Negative.   Psychiatric/Behavioral:  Positive for hallucinations.    Blood pressure 102/67, pulse 79, temperature (!) 97.3 F (36.3 C), resp. rate 18, height 5\' 6"  (1.676 m), weight 55.3 kg, SpO2 99 %. Body mass index is 19.69 kg/m.   Treatment Plan Summary: Daily contact with patient to assess and evaluate symptoms and progress in treatment, Medication management, and Plan continue current medications.  Brysten Reister Tresea Mall, DO 08/26/2022, 1:01 PM

## 2022-08-26 NOTE — Group Note (Signed)
Recreation Therapy Group Note   Group Topic:Goal Setting  Group Date: 08/26/2022 Start Time: 1400 End Time: 1445 Facilitators: Rosina Lowenstein, LRT, CTRS Location:  Dayroom  Group Description: Vision Board. Patients were given many different magazines, a glue stick, markers, and a piece of cardstock paper. LRT and pts discussed the importance of having goals in life. LRT and pts discussed the difference between short-term and long-term goals, as well as what a SMART goal is. LRT encouraged pts to create a vision board, with images they picked and then cut out with safety scissors from the magazine, for themselves, that capture their short and long-term goals. LRT encouraged pts to show and explain their vision board to the group. LRT offered to laminate vision board once dry and complete.   Goal Area(s) Addressed:  Patient will gain knowledge of short vs. long term goals.  Patient will identify goals for themselves. Patient will practice setting SMART goals. Patient will verbalize their goals to LRT and peers.  Affect/Mood: N/A   Participation Level: Hyperverbal    Clinical Observations/Individualized Feedback: Monda was present in the dayroom while group was in session. Pt chose not to complete the activity or engage with LRT or peers. Pt was heard mumbling to herself and speaking out loud to no one almost the whole time group was in session.   Plan: Continue to engage patient in RT group sessions 2-3x/week.   Rosina Lowenstein, LRT, CTRS 08/26/2022 3:08 PM

## 2022-08-26 NOTE — Plan of Care (Signed)
Pt remains agitated and irritable. Tangential with FOI. Speech pressured. Limited interaction with peers and staff. Did  not attend group. Incont of urine. Pt cleaned and dry with min assist. Haldol 5mg  po,  Ativan 1mg  po, and Benadryl 50 mg po given as ordered PRN at 0215. Tol welll. Q min checks maintained for safety. Denies SI/HI. No c/o pain/discomfort noted. Plan of care continued.   Problem: Coping: Goal: Level of anxiety will decrease Outcome: Progressing   Problem: Elimination: Goal: Will not experience complications related to bowel motility Outcome: Progressing Goal: Will not experience complications related to urinary retention Outcome: Progressing   Problem: Pain Managment: Goal: General experience of comfort will improve Outcome: Progressing   Problem: Safety: Goal: Ability to remain free from injury will improve Outcome: Progressing   Problem: Skin Integrity: Goal: Risk for impaired skin integrity will decrease Outcome: Progressing

## 2022-08-26 NOTE — Group Note (Signed)
Date:  08/26/2022 Time:  11:15 AM  Group Topic/Focus:  Developing a Wellness Toolbox:   The focus of this group is to help patients develop a "wellness toolbox" with skills and strategies to promote recovery upon discharge. Managing Feelings:   The focus of this group is to identify what feelings patients have difficulty handling and develop a plan to handle them in a healthier way upon discharge. Wellness Toolbox:   The focus of this group is to discuss various aspects of wellness, balancing those aspects and exploring ways to increase the ability to experience wellness.  Patients will create a wellness toolbox for use upon discharge.    Participation Level:  Active  Participation Quality:  Redirectable  Affect:  Appropriate  Cognitive:  Disorganized  Insight: Good  Engagement in Group:  Engaged  Modes of Intervention:  Activity, Discussion, and Socialization  Additional Comments:  Betty Henderson was unable to follow directions for the craft, but she participated fully until she became distracted.  She made good connections relating to distress tolerance skills.  [name] 08/26/2022, 11:15 AM

## 2022-08-26 NOTE — Plan of Care (Signed)
  Problem: Education: Goal: Knowledge of General Education information will improve Description: Including pain rating scale, medication(s)/side effects and non-pharmacologic comfort measures Outcome: Not Progressing   Problem: Health Behavior/Discharge Planning: Goal: Ability to manage health-related needs will improve Outcome: Not Progressing   Problem: Clinical Measurements: Goal: Ability to maintain clinical measurements within normal limits will improve Outcome: Not Progressing Goal: Diagnostic test results will improve Outcome: Not Progressing   Problem: Nutrition: Goal: Adequate nutrition will be maintained Outcome: Not Progressing   Problem: Coping: Goal: Level of anxiety will decrease Outcome: Not Progressing   Problem: Elimination: Goal: Will not experience complications related to bowel motility Outcome: Not Progressing Goal: Will not experience complications related to urinary retention Outcome: Not Progressing

## 2022-08-26 NOTE — Progress Notes (Signed)
   08/26/22 1100  Psych Admission Type (Psych Patients Only)  Admission Status Involuntary  Psychosocial Assessment  Patient Complaints Irritability  Eye Contact Poor  Facial Expression Anxious  Affect Labile;Anxious;Blunted  Speech Tangential;Pressured  Interaction No initiation  Motor Activity Restless  Appearance/Hygiene In scrubs  Behavior Characteristics Irritable;Anxious;Agitated  Mood Labile;Anxious;Irritable  Thought Process  Coherency Tangential;Flight of ideas;Loose associations  Content Preoccupation  Delusions None reported or observed  Perception Hallucinations  Hallucination Auditory  Judgment Impaired  Confusion Mild  Danger to Self  Current suicidal ideation? Denies  Danger to Others  Danger to Others None reported or observed

## 2022-08-27 DIAGNOSIS — F250 Schizoaffective disorder, bipolar type: Secondary | ICD-10-CM | POA: Diagnosis not present

## 2022-08-27 MED ORDER — OLANZAPINE 5 MG PO TABS
10.0000 mg | ORAL_TABLET | ORAL | Status: DC
  Administered 2022-08-27 – 2022-09-02 (×13): 10 mg via ORAL
  Filled 2022-08-27 (×13): qty 2

## 2022-08-27 NOTE — Group Note (Signed)
Date:  08/27/2022 Time:  7:09 AM  Group Topic/Focus:  Making Healthy Choices:   The focus of this group is to help patients identify negative/unhealthy choices they were using prior to admission and identify positive/healthier coping strategies to replace them upon discharge.    Participation Level:  Minimal  Participation Quality:  Redirectable  Affect:  Labile  Cognitive:  Alert  Insight: Lacking  Engagement in Group:  Distracting  Modes of Intervention:  Support  Additional Comments:    [name] 08/27/2022, 7:09 AM

## 2022-08-27 NOTE — Progress Notes (Signed)
Patient observed yelling in dayroom, using profanities and destructing peers. Patient redirected to her room and was given her medications for agitation. Will continue to monitor and update as needed.

## 2022-08-27 NOTE — Progress Notes (Signed)
[facility] MD Progress Note  08/27/2022 11:49 AM Betty Henderson  MRN:  [id number] Subjective: Betty Henderson is seen on rounds.  She says that she is doing okay.  She has been compliant with medications.  No side effects.  Nurses report that she continues to be internally preoccupied and talking to herself when she is not engaged.  She is sleeping well and her appetite is improved. Principal Problem: Schizoaffective disorder, bipolar type (HCC) Diagnosis: Principal Problem:   Schizoaffective disorder, bipolar type (HCC)  Total Time spent with patient: 15 minutes  Past Psychiatric History: Extensive history of schizophrenia  Past Medical History:  Past Medical History:  Diagnosis Date   Anemia    Arthritis    Chronic pain    Drug-seeking behavior    Malingering    Osteopetrosis    Psychosis (HCC)    Schizoaffective disorder, bipolar type (HCC)     Past Surgical History:  Procedure Laterality Date   MOUTH SURGERY     TUBAL LIGATION     Family History:  Family History  Family history unknown: Yes   Family Psychiatric  History: Unremarkable Social History:  Social History   Substance and Sexual Activity  Alcohol Use Not Currently   Comment: 1 cocktail 3 weeks ago     Social History   Substance and Sexual Activity  Drug Use Not Currently   Types: Cocaine   Comment: states "it's legal though"    Social History   Socioeconomic History   Marital status: Single    Spouse name: Not on file   Number of children: Not on file   Years of education: Not on file   Highest education level: Not on file  Occupational History   Not on file  Tobacco Use   Smoking status: Every Day    Packs/day: .5    Types: Cigarettes   Smokeless tobacco: Never  Vaping Use   Vaping Use: Never used  Substance and Sexual Activity   Alcohol use: Not Currently    Comment: 1 cocktail 3 weeks ago   Drug use: Not Currently    Types: Cocaine    Comment: states "it's legal though"   Sexual activity: Never   Other Topics Concern   Not on file  Social History Narrative   Not on file   Social Determinants of Health   Financial Resource Strain: Not on file  Food Insecurity: Food Insecurity Present (08/22/2022)   Hunger Vital Sign    Worried About Running Out of Food in the Last Year: Sometimes true    Ran Out of Food in the Last Year: Sometimes true  Transportation Needs: Patient Unable To Answer (08/22/2022)   PRAPARE - Administrator, Civil Service (Medical): Patient unable to answer    Lack of Transportation (Non-Medical): Patient unable to answer  Physical Activity: Not on file  Stress: Not on file  Social Connections: Not on file   Additional Social History:                         Sleep: Good  Appetite:  Good  Current Medications: Current Facility-Administered Medications  Medication Dose Route Frequency Provider Last Rate Last Admin   acetaminophen (TYLENOL) tablet 650 mg  650 mg Oral Q6H PRN [name], DO   650 mg at 08/24/22 0110   alum & mag hydroxide-simeth (MAALOX/MYLANTA) 200-200-20 MG/5ML suspension 30 mL  30 mL Oral Q4H PRN [name], DO  chlorproMAZINE (THORAZINE) tablet 50 mg  50 mg Oral QHS [name], DO   50 mg at 08/26/22 2127   diphenhydrAMINE (BENADRYL) capsule 50 mg  50 mg Oral Q6H PRN [name], DO   50 mg at 08/26/22 4098   Or   diphenhydrAMINE (BENADRYL) injection 50 mg  50 mg Intramuscular Q6H PRN [name], DO       haloperidol (HALDOL) tablet 5 mg  5 mg Oral Q6H PRN [name], DO   5 mg at 08/26/22 1191   Or   haloperidol lactate (HALDOL) injection 5 mg  5 mg Intramuscular Q6H PRN [name], DO       LORazepam (ATIVAN) tablet 1 mg  1 mg Oral TID PRN [name], DO   1 mg at 08/26/22 0850   magnesium hydroxide (MILK OF MAGNESIA) suspension 30 mL  30 mL Oral Daily PRN [name], DO       OLANZapine [facility])  tablet 10 mg  10 mg Oral [id number] Advit Trethewey Edward, DO       traZODone (DESYREL) tablet 50 mg  50 mg Oral QHS PRN [name], DO   50 mg at 08/26/22 2127    Lab Results: No results found for this or any previous visit (from the past 48 hour(s)).  Blood Alcohol level:  Lab Results  Component Value Date   ETH <10 08/17/2022   ETH <10 01/11/2021    Metabolic Disorder Labs: No results found for: "HGBA1C", "MPG" No results found for: "PROLACTIN" No results found for: "CHOL", "TRIG", "HDL", "CHOLHDL", "VLDL", "LDLCALC"  Physical Findings: AIMS:  , ,  ,  ,    CIWA:    COWS:     Musculoskeletal: Strength & Muscle Tone: within normal limits Gait & Station: normal Patient leans: N/A  Psychiatric Specialty Exam:  Presentation  General Appearance:  Disheveled (Atypical interpersonal style)  Eye Contact: Other (comment) (Variable to brief)  Speech: Other (comment) (Improved, mostly clear, intermittently garbled)  Speech Volume: Normal  Handedness: Right   Mood and Affect  Mood: -- ("Good")  Affect: Other (comment) (Neutral if mildly constricted)   Thought Process  Thought Processes: Other (comment) (superficially linear, concrete)  Descriptions of Associations:Intact  Orientation:Other (comment) (Grossly intact)  Thought Content:Other (comment) (Superficially logical, short)  History of Schizophrenia/Schizoaffective disorder:No data recorded Duration of Psychotic Symptoms:No data recorded Hallucinations:No data recorded Ideas of Reference:None  Suicidal Thoughts:No data recorded Homicidal Thoughts:No data recorded  Sensorium  Memory: Immediate Poor; Remote Poor  Judgment: Impaired  Insight: Poor   Executive Functions  Concentration: Poor  Attention Span: Poor  Recall: Poor  Fund of Knowledge: Poor  Language: Fair   Psychomotor Activity  Psychomotor Activity:No data recorded  Assets  Assets: Physical  Health; Desire for Improvement   Sleep  Sleep:No data recorded   Physical Exam: Physical Exam Vitals and nursing note reviewed.  Constitutional:      Appearance: Normal appearance. She is normal weight.  Neurological:     General: No focal deficit present.     Mental Status: She is alert and oriented to person, place, and time.  Psychiatric:        Attention and Perception: Attention normal. She perceives auditory hallucinations.        Mood and Affect: Mood normal. Affect is flat and inappropriate.        Speech: Speech normal.        Behavior: Behavior is withdrawn. Behavior is cooperative.  Thought Content: Thought content is paranoid and delusional.        Cognition and Memory: Cognition and memory normal.        Judgment: Judgment is inappropriate.    Review of Systems  Constitutional: Negative.   HENT: Negative.    Eyes: Negative.   Respiratory: Negative.    Cardiovascular: Negative.   Gastrointestinal: Negative.   Genitourinary: Negative.   Musculoskeletal: Negative.   Skin: Negative.   Neurological: Negative.   Endo/Heme/Allergies: Negative.   Psychiatric/Behavioral: Negative.     Blood pressure 98/68, pulse 92, temperature 98.3 F (36.8 C), resp. rate 15, height 5\' 6"  (1.676 m), weight 55.3 kg, SpO2 100 %. Body mass index is 19.69 kg/m.   Treatment Plan Summary: Daily contact with patient to assess and evaluate symptoms and progress in treatment, Medication management, and Plan increase Zyprexa to 10 mg twice a day.  [name], DO 08/27/2022, 11:49 AM

## 2022-08-27 NOTE — Group Note (Signed)
Date:  08/27/2022 Time:  9:12 PM  Group Topic/Focus:  Rediscovering Joy:   The focus of this group is to explore various ways to relieve stress in a positive manner as well as the state of mindfulness.     Participation Level:  Did Not Attend   Insight: None  Engagement in Group:  None  Modes of Intervention:  Activity and Discussion  Additional Comments: This patient was very disruptive to the dayroom this evening. She was advised to return to her room so she did not attend group tonight.   Berna Spare H Jon Lall 08/27/2022, 9:12 PM

## 2022-08-27 NOTE — Progress Notes (Signed)
   08/27/22 1100  Psych Admission Type (Psych Patients Only)  Admission Status Involuntary  Psychosocial Assessment  Patient Complaints Irritability  Eye Contact Poor  Facial Expression Flat  Affect Flat  Speech Pressured;Loud  Interaction No initiation  Appearance/Hygiene In scrubs  Behavior Characteristics Irritable  Mood Irritable  Aggressive Behavior  Type of Behavior Verbal  Effect No apparent injury  Thought Process  Coherency Disorganized  Content Preoccupation  Delusions None reported or observed  Perception Hallucinations  Hallucination Auditory;Visual  Judgment Impaired  Confusion Mild  Danger to Self  Current suicidal ideation? Denies  Danger to Others  Danger to Others None reported or observed   Patient appears to be responding to internal stimuli in an aggressive way. Multiple de-escalation by staff. Patient endorses visual and auditory hallucinations. Ambulates with no difficulty. Tolerates all medication and meals through the shift. Denies SI/HI. Attends group, watches television in the dayroom but no interacting with peers. Will continue to monitor.

## 2022-08-27 NOTE — Plan of Care (Signed)
Alert and oriented with confusion. Visible on the unit. Limited interaction with staff and peers. Did not participant in group. Pt remains internally preoccupied. RIS. Talking to herself out loud cussing and fussing. Able to redirect. Po medications given as scheduled. Trazodone 50 mg po given as ordered PRN at 2115 for insomnia. Q 15 min checks maintained for safety. Denies SI/HI. No c/o pain/discomfort noted. Plan of care continued.   Problem: Coping: Goal: Level of anxiety will decrease Outcome: Progressing   Problem: Elimination: Goal: Will not experience complications related to bowel motility Outcome: Progressing Goal: Will not experience complications related to urinary retention Outcome: Progressing   Problem: Pain Managment: Goal: General experience of comfort will improve Outcome: Progressing   Problem: Safety: Goal: Ability to remain free from injury will improve Outcome: Progressing   Problem: Skin Integrity: Goal: Risk for impaired skin integrity will decrease Outcome: Progressing

## 2022-08-27 NOTE — Group Note (Signed)
LCSW Group Therapy Note   Group Date: 08/27/2022 Start Time: 1315 End Time: 1400  Type of Therapy and Topic: Group Therapy: Social Supports  Participation Level: [facility] PARTICIPATION LEVEL: Did Not Attend  Description of Group: In this group, patients will be encouraged to explore what supports means to them.  Patients were asked and encouraged to identify what support means to them as well as the benefits of having a support system.  Each patient will be challenged to think of who their support system consists of and how that support has improved their quality of life.  This group will be process-oriented, with patients participating in exploration of their own experiences as well as giving and receiving support and challenge from other group members.  Therapeutic Goals: Patient will identify personal support system.  Patient will identify benefits of support systems.   Patient will identify types of support systems.   Patient will discuss ways that their support system has provided a positive impact on their wellbeing.     Summary of Patient Progress: Patient declined to attend though encouraged by this clinician.    Therapeutic Modalities: Cognitive Behavioral Therapy Solution Focused Therapy Motivational Interviewing  Harden Mo, [place] 08/27/2022  2:14 PM

## 2022-08-27 NOTE — Group Note (Signed)
Date:  08/27/2022 Time:  10:03 AM  Group Topic/Focus:  Mindfulness-Allows patients to be mindful about living present  and not dwelling on the past.     Participation Level:  Minimal  Participation Quality:  Sharing  Affect:  Appropriate  Cognitive:  Appropriate and Oriented  Insight: Good and Improving  Engagement in Group:  Supportive  Modes of Intervention:  Activity  Additional Comments:    [name] 08/27/2022, 10:03 AM

## 2022-08-27 NOTE — Group Note (Signed)
Recreation Therapy Group Note   Group Topic:Health and Wellness  Group Date: 08/27/2022 Start Time: 1400 End Time: 1445 Facilitators: Rosina Lowenstein, LRT, CTRS Location:  Dayroom  Group Description: Seated Exercise. LRT discussed the mental and physical benefits of exercise. LRT and group discussed how physical activity can be used as a coping skill. Pt's and LRT followed along to an exercise video on the TV screen that provided a visual representation and audio description of every exercise performed. Pt's encouraged to listen to their bodies and stop at any time if they experience feelings of discomfort or pain. LRT passed out water after session was over and encouraged pts do drink and stay hydrated.  Goal Area(s) Addressed: Patient will learn benefits of physical activity. Patient will identify exercise as a coping skill.  Patient will follow multistep directions. Patient will try a new leisure interest.   Affect/Mood: Full range and Labile   Participation Level: Minimal and Hyperverbal   Participation Quality: Minimal Cues   Behavior: Hallucinating and Off-task   Speech/Thought Process: Flight of ideas and Preoccupied   Insight: Poor   Judgement: Poor   Modes of Intervention: Activity   Patient Response to Interventions:  Disengaged   Education Outcome:  In group clarification offered    Clinical Observations/Individualized Feedback: Betty Henderson was minimally active in their participation of session activities and group discussion. Pt was talking to herself out loud during the whole session. Pt would be talking calmly one minute then raising her voice and cussing at things that were not there the next. Pt was observed completing maybe two of the exercises shown, although while talking to herself.    Plan: Continue to engage patient in RT group sessions 2-3x/week.   Rosina Lowenstein, LRT, CTRS 08/27/2022 3:30 PM

## 2022-08-28 DIAGNOSIS — F250 Schizoaffective disorder, bipolar type: Secondary | ICD-10-CM | POA: Diagnosis not present

## 2022-08-28 NOTE — Group Note (Signed)
Date:  08/28/2022 Time:  6:45 PM  Group Topic/Focus:  Personal Choices and Values:   The focus of this group is to help patients assess and explore the importance of values in their lives, how their values affect their decisions, how they express their values and what opposes their expression.    Participation Level:  Active  Participation Quality:  Appropriate  Affect:  Appropriate and Labile  Cognitive:  Appropriate  Insight: Improving  Engagement in Group:  Engaged and Improving  Modes of Intervention:  Discussion  Additional Comments:    Doug Sou 08/28/2022, 6:45 PM

## 2022-08-28 NOTE — Group Note (Signed)
Recreation Therapy Group Note   Group Topic:Problem Solving  Group Date: 08/28/2022 Start Time: 1400 End Time: 1455 Facilitators: Rosina Lowenstein, LRT, CTRS Location:  Craft Room  Group Description: Life Boat. Patients were given the scenario that they are on a boat that is about to become shipwrecked, leaving them stranded on an [place]. They are asked to make a list of 15 different items that they want to take with them when they are stranded on the [place]. Patients are asked to rank their items from most important to least important, #1 being the most important and #15 being the least. Patients will work individually for the first round to come up with 15 items and then pair up with a peer(s) to condense their list and come up with one list of 15 items between the two of them. Patients or LRT will read aloud the 15 different items to the group after each round. LRT facilitated post-activity processing to discuss how this activity can be used in daily life post discharge.   Goal Area(s) Addressed:  Patient will identify priorities, wants and needs. Patient will communicate with LRT and peers. Patient will work collectively as a Administrator, Civil Service. Patient will work on Product manager.   Affect/Mood: Appropriate   Participation Level: Active and Engaged   Participation Quality: Independent   Behavior: Calm and Cooperative   Speech/Thought Process: Coherent   Insight: Fair   Judgement: Fair    Modes of Intervention: Activity   Patient Response to Interventions:  Receptive   Education Outcome:  In group clarification offered    Clinical Observations/Individualized Feedback: Betty Henderson was mostly active in their participation of session activities and group discussion. Pt identified "water, strings, soup cans, potatoes, pots, pans, gloves, cops, blankets and soap" as some of the items she will bring with her on the [place]. Pt was speaking to herself maybe once or twice during group,  however calmed herself down and did not need redirection from LRT. Pt appropriately completed list independently, however minimally interacted with peers when it was time to work together as a team.    Plan: Continue to engage patient in RT group sessions 2-3x/week.   Rosina Lowenstein, LRT, CTRS 08/28/2022 3:57 PM

## 2022-08-28 NOTE — Group Note (Signed)
Date:  08/28/2022 Time:  10:22 PM  Group Topic/Focus:  Coping With Mental Health Crisis:   The purpose of this group is to help patients identify strategies for coping with mental health crisis.  Group discusses possible causes of crisis and ways to manage them effectively.    Participation Level:  Did Not Attend  Participation Quality:    Did Not Attend  Affect:    Did Not Attend  Cognitive:    Did Not Attend  Insight: None  Engagement in Group:    Did Not Attend  Modes of Intervention:  Limit-setting  Additional Comments:    [name] 08/28/2022, 10:22 PM

## 2022-08-28 NOTE — BH IP Treatment Plan (Signed)
Interdisciplinary Treatment and Diagnostic Plan Update  08/28/2022 Time of Session: 9:58AM Marzee Keady MRN: [id number]  Principal Diagnosis: Schizoaffective disorder, bipolar type [facility])  Secondary Diagnoses: Principal Problem:   Schizoaffective disorder, bipolar type (HCC)   Current Medications:  Current Facility-Administered Medications  Medication Dose Route Frequency Provider Last Rate Last Admin   acetaminophen (TYLENOL) tablet 650 mg  650 mg Oral Q6H PRN [name], DO   650 mg at 08/24/22 0110   alum & mag hydroxide-simeth (MAALOX/MYLANTA) 200-200-20 MG/5ML suspension 30 mL  30 mL Oral Q4H PRN [name], DO       chlorproMAZINE (THORAZINE) tablet 50 mg  50 mg Oral QHS [name], DO   50 mg at 08/27/22 2114   diphenhydrAMINE (BENADRYL) capsule 50 mg  50 mg Oral Q6H PRN [name], DO   50 mg at 08/27/22 2121   Or   diphenhydrAMINE (BENADRYL) injection 50 mg  50 mg Intramuscular Q6H PRN [name], DO       haloperidol (HALDOL) tablet 5 mg  5 mg Oral Q6H PRN [name], DO   5 mg at 08/27/22 2121   Or   haloperidol lactate (HALDOL) injection 5 mg  5 mg Intramuscular Q6H PRN [name], DO       LORazepam (ATIVAN) tablet 1 mg  1 mg Oral TID PRN [name], DO   1 mg at 08/27/22 2121   magnesium hydroxide (MILK OF MAGNESIA) suspension 30 mL  30 mL Oral Daily PRN [name], DO       OLANZapine (ZYPREXA) tablet 10 mg  10 mg Oral BH-q8a4p [name], DO   10 mg at 08/28/22 0905   traZODone (DESYREL) tablet 50 mg  50 mg Oral QHS PRN [name], DO   50 mg at 08/27/22 2114   PTA Medications: Medications Prior to Admission  Medication Sig Dispense Refill Last Dose   ondansetron (ZOFRAN) 4 MG tablet Take 1 tablet (4 mg total) by mouth every 8 (eight) hours as needed for nausea or vomiting. (Patient not taking: Reported on 08/17/2022) 20  tablet 0     Patient Stressors: Financial difficulties   Health problems   Medication change or noncompliance    Patient Strengths: Ability for insight   Treatment Modalities: Medication Management, Group therapy, Case management,  1 to 1 session with clinician, Psychoeducation, Recreational therapy.   Physician Treatment Plan for Primary Diagnosis: Schizoaffective disorder, bipolar type (HCC) Long Term Goal(s): Improvement in symptoms so as ready for discharge   Short Term Goals: Ability to identify changes in lifestyle to reduce recurrence of condition will improve Ability to verbalize feelings will improve Ability to disclose and discuss suicidal ideas Ability to demonstrate self-control will improve Ability to identify and develop effective coping behaviors will improve Ability to maintain clinical measurements within normal limits will improve Compliance with prescribed medications will improve Ability to identify triggers associated with substance abuse/mental health issues will improve  Medication Management: Evaluate patient's response, side effects, and tolerance of medication regimen.  Therapeutic Interventions: 1 to 1 sessions, Unit Group sessions and Medication administration.  Evaluation of Outcomes: Not Progressing  Physician Treatment Plan for Secondary Diagnosis: Principal Problem:   Schizoaffective disorder, bipolar type (HCC)  Long Term Goal(s): Improvement in symptoms so as ready for discharge   Short Term Goals: Ability to identify changes in lifestyle to reduce recurrence of condition will improve Ability to verbalize feelings will improve Ability  to disclose and discuss suicidal ideas Ability to demonstrate self-control will improve Ability to identify and develop effective coping behaviors will improve Ability to maintain clinical measurements within normal limits will improve Compliance with prescribed medications will improve Ability to identify  triggers associated with substance abuse/mental health issues will improve     Medication Management: Evaluate patient's response, side effects, and tolerance of medication regimen.  Therapeutic Interventions: 1 to 1 sessions, Unit Group sessions and Medication administration.  Evaluation of Outcomes: Not Progressing   RN Treatment Plan for Primary Diagnosis: Schizoaffective disorder, bipolar type (HCC) Long Term Goal(s): Knowledge of disease and therapeutic regimen to maintain health will improve  Short Term Goals: Ability to demonstrate self-control, Ability to participate in decision making will improve, Ability to verbalize feelings will improve, Ability to disclose and discuss suicidal ideas, Ability to identify and develop effective coping behaviors will improve, and Compliance with prescribed medications will improve  Medication Management: RN will administer medications as ordered by provider, will assess and evaluate patient's response and provide education to patient for prescribed medication. RN will report any adverse and/or side effects to prescribing provider.  Therapeutic Interventions: 1 on 1 counseling sessions, Psychoeducation, Medication administration, Evaluate responses to treatment, Monitor vital signs and CBGs as ordered, Perform/monitor CIWA, COWS, AIMS and Fall Risk screenings as ordered, Perform wound care treatments as ordered.  Evaluation of Outcomes: Not Progressing   LCSW Treatment Plan for Primary Diagnosis: Schizoaffective disorder, bipolar type (HCC) Long Term Goal(s): Safe transition to appropriate next level of care at discharge, Engage patient in therapeutic group addressing interpersonal concerns.  Short Term Goals: Engage patient in aftercare planning with referrals and resources, Increase social support, Increase ability to appropriately verbalize feelings, Increase emotional regulation, Facilitate acceptance of mental health diagnosis and concerns, and  Increase skills for wellness and recovery  Therapeutic Interventions: Assess for all discharge needs, 1 to 1 time with Social worker, Explore available resources and support systems, Assess for adequacy in community support network, Educate family and significant other(s) on suicide prevention, Complete Psychosocial Assessment, Interpersonal group therapy.  Evaluation of Outcomes: Not Progressing   Progress in Treatment: Attending groups: No. Participating in groups: No. Taking medication as prescribed: Yes. Toleration medication: Yes. Family/Significant other contact made: No, will contact:  once permission is given. Patient understands diagnosis: No. Discussing patient identified problems/goals with staff: No. Medical problems stabilized or resolved: Yes. Denies suicidal/homicidal ideation: Yes. Issues/concerns per patient self-inventory: No. Other: none  New problem(s) identified: No, Describe:  none   New Short Term/Long Term Goal(s): detox, elimination of symptoms of psychosis, medication management for mood stabilization; elimination of SI thoughts; development of comprehensive mental wellness/sobriety plan.  Update 08/28/2022:  No changes at this time.    Patient Goals:  Patient was present in group. Patient too disorganized to provide a goal at this time. Update 08/28/2022:  No changes at this time.    Discharge Plan or Barriers: CSW to assist patient in development of appropriate discharge plans.  Update 08/28/2022:  Patient remains psychotic at this time.  Progress has been limited.  Unable to identify placement for patient.  Discussions are being had on contacting DSS to make possible APS report for guardianship for patient.    Reason for Continuation of Hospitalization: Aggression Delusions  Depression Hallucinations Medication stabilization   Estimated Length of Stay: Update 08/28/2022:  No changes at this time.   Last 3 [place] Suicide Severity Risk Score: Flowsheet Row  Admission (Current) from 08/22/2022 in [facility]  BEHAVIORAL MEDICINE ED from 08/17/2022 in [facility] Emergency Department at [facility] ED from 08/12/2022 in [facility] Emergency Department at [facility]  C-SSRS RISK CATEGORY Low Risk No Risk No Risk       Last [facility] 2/9 Scores:     No data to display          Scribe for Treatment Team: Harden Mo, [place] 08/28/2022 9:58 AM

## 2022-08-28 NOTE — Group Note (Signed)
[facility] LCSW Group Therapy Note   Group Date: 08/28/2022 Start Time: 1315 End Time: 1400   Type of Therapy/Topic:  Group Therapy:  Emotion Regulation  Participation Level:  None   Mood:  Description of Group:    The purpose of this group is to assist patients in learning to regulate negative emotions and experience positive emotions. Patients will be guided to discuss ways in which they have been vulnerable to their negative emotions. These vulnerabilities will be juxtaposed with experiences of positive emotions or situations, and patients challenged to use positive emotions to combat negative ones. Special emphasis will be placed on coping with negative emotions in conflict situations, and patients will process healthy conflict resolution skills.  Therapeutic Goals: Patient will identify two positive emotions or experiences to reflect on in order to balance out negative emotions:  Patient will label two or more emotions that they find the most difficult to experience:  Patient will be able to demonstrate positive conflict resolution skills through discussion or role plays:   Summary of Patient Progress: Patient was asleep during the first half of group.  Patient was awake and responding to internal stimuli for the second half.  Patient is not appropriate for group at this time.     Therapeutic Modalities:   Cognitive Behavioral Therapy Feelings Identification Dialectical Behavioral Therapy   Harden Mo, LCSW

## 2022-08-28 NOTE — Progress Notes (Signed)
[facility] MD Progress Note  08/28/2022 1:51 PM Danis Ciano  MRN:  [id number] Subjective: Betty Henderson is seen on rounds.  She has been compliant with medications and there has been no side effects.  We went up on her Zyprexa a couple days ago.  She still internally preoccupied at times and talks to herself.  She can be intrusive to other peers.  She also has inappropriate outbursts at times.  Other than that, she has been in good controls.  Principal Problem: Schizoaffective disorder, bipolar type (HCC) Diagnosis: Principal Problem:   Schizoaffective disorder, bipolar type (HCC)  Total Time spent with patient: 15 minutes  Past Psychiatric History: Long history of schizophrenia and hospitalizations.  Currently homeless.  Past Medical History:  Past Medical History:  Diagnosis Date   Anemia    Arthritis    Chronic pain    Drug-seeking behavior    Malingering    Osteopetrosis    Psychosis (HCC)    Schizoaffective disorder, bipolar type (HCC)     Past Surgical History:  Procedure Laterality Date   MOUTH SURGERY     TUBAL LIGATION     Family History:  Family History  Family history unknown: Yes   Family Psychiatric  History: Unremarkable Social History:  Social History   Substance and Sexual Activity  Alcohol Use Not Currently   Comment: 1 cocktail 3 weeks ago     Social History   Substance and Sexual Activity  Drug Use Not Currently   Types: Cocaine   Comment: states "it's legal though"    Social History   Socioeconomic History   Marital status: Single    Spouse name: Not on file   Number of children: Not on file   Years of education: Not on file   Highest education level: Not on file  Occupational History   Not on file  Tobacco Use   Smoking status: Every Day    Packs/day: .5    Types: Cigarettes   Smokeless tobacco: Never  Vaping Use   Vaping Use: Never used  Substance and Sexual Activity   Alcohol use: Not Currently    Comment: 1 cocktail 3 weeks ago    Drug use: Not Currently    Types: Cocaine    Comment: states "it's legal though"   Sexual activity: Never  Other Topics Concern   Not on file  Social History Narrative   Not on file   Social Determinants of Health   Financial Resource Strain: Not on file  Food Insecurity: Food Insecurity Present (08/22/2022)   Hunger Vital Sign    Worried About Running Out of Food in the Last Year: Sometimes true    Ran Out of Food in the Last Year: Sometimes true  Transportation Needs: Patient Unable To Answer (08/22/2022)   PRAPARE - Administrator, Civil Service (Medical): Patient unable to answer    Lack of Transportation (Non-Medical): Patient unable to answer  Physical Activity: Not on file  Stress: Not on file  Social Connections: Not on file   Additional Social History:                         Sleep: Good  Appetite:  Good  Current Medications: Current Facility-Administered Medications  Medication Dose Route Frequency Provider Last Rate Last Admin   acetaminophen (TYLENOL) tablet 650 mg  650 mg Oral Q6H PRN [name], DO   650 mg at 08/24/22 0110   alum &  mag hydroxide-simeth (MAALOX/MYLANTA) 200-200-20 MG/5ML suspension 30 mL  30 mL Oral Q4H PRN [name], DO       chlorproMAZINE (THORAZINE) tablet 50 mg  50 mg Oral QHS [name], DO   50 mg at 08/27/22 2114   diphenhydrAMINE (BENADRYL) capsule 50 mg  50 mg Oral Q6H PRN [name], DO   50 mg at 08/27/22 2121   Or   diphenhydrAMINE (BENADRYL) injection 50 mg  50 mg Intramuscular Q6H PRN [name], DO       haloperidol (HALDOL) tablet 5 mg  5 mg Oral Q6H PRN [name], DO   5 mg at 08/27/22 2121   Or   haloperidol lactate (HALDOL) injection 5 mg  5 mg Intramuscular Q6H PRN [name], DO       LORazepam (ATIVAN) tablet 1 mg  1 mg Oral TID PRN [name], DO   1 mg at 08/27/22 2121   magnesium hydroxide  (MILK OF MAGNESIA) suspension 30 mL  30 mL Oral Daily PRN [name], DO       OLANZapine [facility]) tablet 10 mg  10 mg Oral BH-[id number] [name], DO   10 mg at 08/28/22 1610   traZODone (DESYREL) tablet 50 mg  50 mg Oral QHS PRN [name], DO   50 mg at 08/27/22 2114    Lab Results: No results found for this or any previous visit (from the past 48 hour(s)).  Blood Alcohol level:  Lab Results  Component Value Date   ETH <10 08/17/2022   ETH <10 01/11/2021    Metabolic Disorder Labs: No results found for: "HGBA1C", "MPG" No results found for: "PROLACTIN" No results found for: "CHOL", "TRIG", "HDL", "CHOLHDL", "VLDL", "LDLCALC"  Physical Findings: AIMS:  , ,  ,  ,    CIWA:    COWS:     Musculoskeletal: Strength & Muscle Tone: within normal limits Gait & Station: normal Patient leans: N/A  Psychiatric Specialty Exam:  Presentation  General Appearance:  Disheveled (Atypical interpersonal style)  Eye Contact: Other (comment) (Variable to brief)  Speech: Other (comment) (Improved, mostly clear, intermittently garbled)  Speech Volume: Normal  Handedness: Right   Mood and Affect  Mood: -- ("Good")  Affect: Other (comment) (Neutral if mildly constricted)   Thought Process  Thought Processes: Other (comment) (superficially linear, concrete)  Descriptions of Associations:Intact  Orientation:Other (comment) (Grossly intact)  Thought Content:Other (comment) (Superficially logical, short)  History of Schizophrenia/Schizoaffective disorder:No data recorded Duration of Psychotic Symptoms:No data recorded Hallucinations:No data recorded Ideas of Reference:None  Suicidal Thoughts:No data recorded Homicidal Thoughts:No data recorded  Sensorium  Memory: Immediate Poor; Remote Poor  Judgment: Impaired  Insight: Poor   Executive Functions  Concentration: Poor  Attention Span: Poor  Recall: Poor  Fund of  Knowledge: Poor  Language: Fair   Psychomotor Activity  Psychomotor Activity:No data recorded  Assets  Assets: Physical Health; Desire for Improvement   Sleep  Sleep:No data recorded   Physical Exam: Physical Exam Vitals and nursing note reviewed.  Constitutional:      Appearance: Normal appearance. She is normal weight.  Neurological:     General: No focal deficit present.     Mental Status: She is alert and oriented to person, place, and time.  Psychiatric:        Attention and Perception: Attention normal. She perceives auditory hallucinations.        Mood and Affect: Mood normal. Affect is  inappropriate.        Speech: Speech is delayed and tangential.        Behavior: Behavior normal. Behavior is cooperative.        Thought Content: Thought content is paranoid and delusional.        Cognition and Memory: Memory normal. Cognition is impaired.        Judgment: Judgment is inappropriate.    Review of Systems  Constitutional: Negative.   HENT: Negative.    Eyes: Negative.   Respiratory: Negative.    Cardiovascular: Negative.   Gastrointestinal: Negative.   Genitourinary: Negative.   Musculoskeletal: Negative.   Skin: Negative.   Neurological: Negative.   Endo/Heme/Allergies: Negative.   Psychiatric/Behavioral:  Positive for hallucinations.    Blood pressure 107/75, pulse 75, temperature 98.4 F (36.9 C), resp. rate 15, height 5\' 6"  (1.676 m), weight 55.3 kg, SpO2 100 %. Body mass index is 19.69 kg/m.   Treatment Plan Summary: Daily contact with patient to assess and evaluate symptoms and progress in treatment, Medication management, and Plan continue current medications.  Alecea Trego Tresea Mall, DO 08/28/2022, 1:51 PM

## 2022-08-28 NOTE — Progress Notes (Signed)
   08/28/22 1000  Psych Admission Type (Psych Patients Only)  Admission Status Involuntary  Psychosocial Assessment  Patient Complaints Irritability  Eye Contact Poor  Facial Expression Flat  Affect Flat  Speech Loud  Interaction No initiation  Appearance/Hygiene In scrubs  Behavior Characteristics Irritable  Mood Irritable  Aggressive Behavior  Type of Behavior Verbal  Effect No apparent injury  Thought Process  Coherency Disorganized  Content Preoccupation  Delusions None reported or observed  Perception Hallucinations  Hallucination Auditory;Visual  Judgment Impaired  Confusion Mild  Danger to Self  Current suicidal ideation? Denies  Danger to Others  Danger to Others None reported or observed   Patient continues to response to internal stimuli and yelling out loud. Multiple verbal de-escalation with no agitation noted. Attended group today and watched television in dayroom with no interaction with peers. Tolerated all meals and medications.

## 2022-08-28 NOTE — Progress Notes (Signed)
   08/28/22 0100  Psych Admission Type (Psych Patients Only)  Admission Status Involuntary  Psychosocial Assessment  Patient Complaints Irritability  Eye Contact Poor  Facial Expression Flat  Affect Flat  Speech Pressured;Loud  Interaction No initiation  Motor Activity Fidgety;Hyperactive  Appearance/Hygiene In scrubs  Behavior Characteristics Irritable  Mood Irritable  Aggressive Behavior  Type of Behavior Verbal  Effect No apparent injury  Thought Process  Coherency Disorganized  Content Preoccupation  Delusions None reported or observed  Perception Hallucinations  Hallucination Auditory;Visual  Judgment Impaired  Confusion Mild  Danger to Self  Current suicidal ideation? Denies  Danger to Others  Danger to Others None reported or observed

## 2022-08-28 NOTE — [facility] Counselor (Signed)
CSW spoke with patient on if patient had anyone they would like CSW to speak with during her admission. Patient reported that she would like for this writer to speak to her son and provided name and number.  CSW asked about follow up and patient reported that she would like a referral at discharge.  CSW obtained signatures for the consents.  CSW spoke with patient on plans for housing at discharge.  Patient became irritable stating that she was tired of talking about this and discussing.  Irritation and anger evidenced by raised voice, gestures and body language; patient clapped hands as she spoke.   Patient stated that she was going to stay "at [employer]  CSW had some difficulty understanding patient and asked patient to write it down, patient wrote "[facility] [employer]".  CSW asked for clarification and pt again became irritable and stated that she is "a heiress on both sides of my family, I don't know why you're asking me this". Patient then walked off.  Conversation terminated without incident.  Penni Homans, MSW, LCSW 08/28/2022 4:17 PM

## 2022-08-29 DIAGNOSIS — F250 Schizoaffective disorder, bipolar type: Secondary | ICD-10-CM | POA: Diagnosis not present

## 2022-08-29 NOTE — [facility] Group Notes (Signed)
[facility] Group Notes:  (Nursing/MHT/Case Management/Adjunct)  Date:  08/29/2022  Time:  10:13 AM  Type of Therapy:  Music Therapy  Participation Level:  Active  Participation Quality:  Appropriate  Affect:  Appropriate  Cognitive:  Appropriate  Insight:  Appropriate  Engagement in Group:  Engaged  Modes of Intervention:  Activity and Discussion  Summary of Progress/Problems:  Momina Hunton P Leif Loflin 08/29/2022, 10:13 AM 

## 2022-08-29 NOTE — Progress Notes (Signed)
   08/28/22 2330  Psych Admission Type (Psych Patients Only)  Admission Status Involuntary  Psychosocial Assessment  Patient Complaints Irritability  Eye Contact Poor  Facial Expression Flat  Affect Flat  Speech Loud  Interaction No initiation  Motor Activity Fidgety;Hyperactive  Appearance/Hygiene In scrubs  Behavior Characteristics Irritable  Mood Irritable  Aggressive Behavior  Type of Behavior Verbal  Effect No apparent injury  Thought Process  Coherency Disorganized  Content Preoccupation  Delusions None reported or observed  Perception Hallucinations  Hallucination Auditory;Visual  Judgment Impaired  Confusion Mild  Danger to Self  Current suicidal ideation? Denies  Danger to Others  Danger to Others None reported or observed

## 2022-08-29 NOTE — Progress Notes (Signed)
   08/29/22 2200  Psych Admission Type (Psych Patients Only)  Admission Status Voluntary  Psychosocial Assessment  Patient Complaints None  Eye Contact Brief  Facial Expression Flat  Affect Flat  Speech Soft  Interaction Minimal  Motor Activity Slow  Appearance/Hygiene In scrubs  Behavior Characteristics Calm  Mood Other (Comment) (unremarkable)  Aggressive Behavior  Type of Behavior Verbal  Effect No apparent injury  Thought Process  Coherency WDL  Content WDL  Delusions None reported or observed  Perception WDL  Hallucination None reported or observed  Judgment WDL  Confusion None  Danger to Self  Current suicidal ideation? Denies  Danger to Others  Danger to Others None reported or observed

## 2022-08-29 NOTE — [facility] Suicide Risk Assessment (Signed)
[facility] INPATIENT:  Family/Significant Other Suicide Prevention Education  Suicide Prevention Education:  Education Completed; Dejanelle Brauns, son, [phone] has been identified by the patient as the family member/significant other with whom the patient will be residing, and identified as the person(s) who will aid the patient in the event of a mental health crisis (suicidal ideations/suicide attempt).  With written consent from the patient, the family member/significant other has been provided the following suicide prevention education, prior to the and/or following the discharge of the patient.  The suicide prevention education provided includes the following: Suicide risk factors Suicide prevention and interventions National Suicide Hotline telephone number [facility] assessment telephone number [facility] Emergency Assistance 911 [facility] and/or Residential Mobile Crisis Unit telephone number  Request made of family/significant other to: Remove weapons (e.g., guns, rifles, knives), all items previously/currently identified as safety concern.   Remove drugs/medications (over-the-counter, prescriptions, illicit drugs), all items previously/currently identified as a safety concern.  The family member/significant other verbalizes understanding of the suicide prevention education information provided.  The family member/significant other agrees to remove the items of safety concern listed above.  He reports that he was unaware that mother was in the hospital.  He reports that patient is at baseline.    He reports that patient has no supports.  He reports that patient is her own guardian.  He reports that they only communicate every 3 or 4 months.  He reports patient does not have anywhere to go at discharge.  He reports that he has attempted to have patient stay in his home, however due to behaviors she had to leave.  He reports that he has concerns that she may be a  liability to his son.  He reports that "I don't make any decisions on her part".  He reports that neither him, his brother, or patient's siblings are going to allow her to stay.  He reports that patient has been in this condition for the last 20 years.   He reports that patient does not remain in any placement the family has attempted to find.       Harden Mo 08/29/2022, 3:12 PM

## 2022-08-29 NOTE — Progress Notes (Addendum)
I assumed care for Betty Henderson at about 07:00.  She was resting in the day area watching TV with peers this am, in no apparent distress.  No behavioral problems this am thus far, denied any new pain, denied any avh/hi/si at this time, no falls, she has been med compliant, no evidence of psychosis, only concern she had was when she will be discharged. Pt is being monitored as ordered. She has been medication compliant thus far  No changes in condition from baseline, she was responding to internal stimuli this evening. No falls. She is being monitored as ordered. Vital signs wnl

## 2022-08-29 NOTE — Progress Notes (Signed)
[facility] MD Progress Note  08/29/2022 11:56 AM Betty Henderson  MRN:  [id number] Subjective: Betty Henderson is seen on rounds.  Nurses report that she is responding less to internal stimuli.  She says that she is doing fine.  She has been compliant with medications and no side effects. Principal Problem: Schizoaffective disorder, bipolar type (HCC) Diagnosis: Principal Problem:   Schizoaffective disorder, bipolar type (HCC)  Total Time spent with patient: 15 minutes  Past Psychiatric History: Long history of schizophrenia and hospitalization.  Past Medical History:  Past Medical History:  Diagnosis Date   Anemia    Arthritis    Chronic pain    Drug-seeking behavior    Malingering    Osteopetrosis    Psychosis (HCC)    Schizoaffective disorder, bipolar type (HCC)     Past Surgical History:  Procedure Laterality Date   MOUTH SURGERY     TUBAL LIGATION     Family History:  Family History  Family history unknown: Yes   Family Psychiatric  History: Unremarkable Social History:  Social History   Substance and Sexual Activity  Alcohol Use Not Currently   Comment: 1 cocktail 3 weeks ago     Social History   Substance and Sexual Activity  Drug Use Not Currently   Types: Cocaine   Comment: states "it's legal though"    Social History   Socioeconomic History   Marital status: Single    Spouse name: Not on file   Number of children: Not on file   Years of education: Not on file   Highest education level: Not on file  Occupational History   Not on file  Tobacco Use   Smoking status: Every Day    Packs/day: .5    Types: Cigarettes   Smokeless tobacco: Never  Vaping Use   Vaping Use: Never used  Substance and Sexual Activity   Alcohol use: Not Currently    Comment: 1 cocktail 3 weeks ago   Drug use: Not Currently    Types: Cocaine    Comment: states "it's legal though"   Sexual activity: Never  Other Topics Concern   Not on file  Social History Narrative   Not on file    Social Determinants of Health   Financial Resource Strain: Not on file  Food Insecurity: Food Insecurity Present (08/22/2022)   Hunger Vital Sign    Worried About Running Out of Food in the Last Year: Sometimes true    Ran Out of Food in the Last Year: Sometimes true  Transportation Needs: Patient Unable To Answer (08/22/2022)   PRAPARE - Administrator, Civil Service (Medical): Patient unable to answer    Lack of Transportation (Non-Medical): Patient unable to answer  Physical Activity: Not on file  Stress: Not on file  Social Connections: Not on file   Additional Social History:                         Sleep: Good  Appetite:  Good  Current Medications: Current Facility-Administered Medications  Medication Dose Route Frequency Provider Last Rate Last Admin   acetaminophen (TYLENOL) tablet 650 mg  650 mg Oral Q6H PRN [name], DO   650 mg at 08/24/22 0110   alum & mag hydroxide-simeth (MAALOX/MYLANTA) 200-200-20 MG/5ML suspension 30 mL  30 mL Oral Q4H PRN [name], DO       chlorproMAZINE (THORAZINE) tablet 50 mg  50 mg Oral QHS Elane Fritz  Edward, DO   50 mg at 08/28/22 2124   diphenhydrAMINE (BENADRYL) capsule 50 mg  50 mg Oral Q6H PRN [name], DO   50 mg at 08/28/22 2339   Or   diphenhydrAMINE (BENADRYL) injection 50 mg  50 mg Intramuscular Q6H PRN [name], DO       haloperidol (HALDOL) tablet 5 mg  5 mg Oral Q6H PRN [name], DO   5 mg at 08/28/22 2339   Or   haloperidol lactate (HALDOL) injection 5 mg  5 mg Intramuscular Q6H PRN [name], DO       LORazepam (ATIVAN) tablet 1 mg  1 mg Oral TID PRN [name], DO   1 mg at 08/28/22 2339   magnesium hydroxide (MILK OF MAGNESIA) suspension 30 mL  30 mL Oral Daily PRN [name], DO       OLANZapine [facility]) tablet 10 mg  10 mg Oral [id number] [name], DO   10 mg at  08/29/22 9147   traZODone (DESYREL) tablet 50 mg  50 mg Oral QHS PRN [name], DO   50 mg at 08/28/22 2124    Lab Results: No results found for this or any previous visit (from the past 48 hour(s)).  Blood Alcohol level:  Lab Results  Component Value Date   ETH <10 08/17/2022   ETH <10 01/11/2021    Metabolic Disorder Labs: No results found for: "HGBA1C", "MPG" No results found for: "PROLACTIN" No results found for: "CHOL", "TRIG", "HDL", "CHOLHDL", "VLDL", "LDLCALC"  Physical Findings: AIMS:  , ,  ,  ,    CIWA:    COWS:     Musculoskeletal: Strength & Muscle Tone: within normal limits Gait & Station: normal Patient leans: N/A  Psychiatric Specialty Exam:  Presentation  General Appearance:  Disheveled (Atypical interpersonal style)  Eye Contact: Other (comment) (Variable to brief)  Speech: Other (comment) (Improved, mostly clear, intermittently garbled)  Speech Volume: Normal  Handedness: Right   Mood and Affect  Mood: -- ("Good")  Affect: Other (comment) (Neutral if mildly constricted)   Thought Process  Thought Processes: Other (comment) (superficially linear, concrete)  Descriptions of Associations:Intact  Orientation:Other (comment) (Grossly intact)  Thought Content:Other (comment) (Superficially logical, short)  History of Schizophrenia/Schizoaffective disorder:No data recorded Duration of Psychotic Symptoms:No data recorded Hallucinations:No data recorded Ideas of Reference:None  Suicidal Thoughts:No data recorded Homicidal Thoughts:No data recorded  Sensorium  Memory: Immediate Poor; Remote Poor  Judgment: Impaired  Insight: Poor   Executive Functions  Concentration: Poor  Attention Span: Poor  Recall: Poor  Fund of Knowledge: Poor  Language: Fair   Psychomotor Activity  Psychomotor Activity:No data recorded  Assets  Assets: Physical Health; Desire for Improvement   Sleep  Sleep:No data  recorded   Physical Exam: Physical Exam Vitals and nursing note reviewed.  Constitutional:      Appearance: Normal appearance. She is normal weight.  Neurological:     General: No focal deficit present.     Mental Status: She is alert and oriented to person, place, and time.  Psychiatric:        Attention and Perception: She perceives auditory hallucinations.        Mood and Affect: Mood is anxious. Affect is labile.        Speech: Speech normal.        Behavior: Behavior normal. Behavior is cooperative.        Thought Content: Thought content is paranoid  and delusional.        Cognition and Memory: Cognition and memory normal.        Judgment: Judgment is inappropriate.    Review of Systems  Constitutional: Negative.   HENT: Negative.    Eyes: Negative.   Respiratory: Negative.    Cardiovascular: Negative.   Gastrointestinal: Negative.   Genitourinary: Negative.   Musculoskeletal: Negative.   Skin: Negative.   Neurological: Negative.   Endo/Heme/Allergies: Negative.   Psychiatric/Behavioral:  Positive for hallucinations.    Blood pressure 110/70, pulse 84, temperature 97.7 F (36.5 C), resp. rate 18, height 5\' 6"  (1.676 m), weight 55.3 kg, SpO2 100 %. Body mass index is 19.69 kg/m.   Treatment Plan Summary: Daily contact with patient to assess and evaluate symptoms and progress in treatment, Medication management, and Plan continue current medication.  [name], DO 08/29/2022, 11:56 AM

## 2022-08-29 NOTE — Group Note (Signed)
[facility] LCSW Group Therapy Note   Group Date: 08/29/2022 Start Time: 1300 End Time: 1400   Type of Therapy/Topic:  Group Therapy:  Balance in Life  Participation Level:  Did Not Attend   Description of Group:    This group will address the concept of balance and how it feels and looks when one is unbalanced. Patients will be encouraged to process areas in their lives that are out of balance, and identify reasons for remaining unbalanced. Facilitators will guide patients utilizing problem- solving interventions to address and correct the stressor making their life unbalanced. Understanding and applying boundaries will be explored and addressed for obtaining  and maintaining a balanced life. Patients will be encouraged to explore ways to assertively make their unbalanced needs known to significant others in their lives, using other group members and facilitator for support and feedback.  Therapeutic Goals: Patient will identify two or more emotions or situations they have that consume much of in their lives. Patient will identify signs/triggers that life has become out of balance:  Patient will identify two ways to set boundaries in order to achieve balance in their lives:  Patient will demonstrate ability to communicate their needs through discussion and/or role plays  Summary of Patient Progress: Patient did not attend group.    Therapeutic Modalities:   Cognitive Behavioral Therapy Solution-Focused Therapy Assertiveness Training   Harden Mo, LCSW

## 2022-08-30 DIAGNOSIS — F250 Schizoaffective disorder, bipolar type: Secondary | ICD-10-CM | POA: Diagnosis not present

## 2022-08-30 NOTE — Plan of Care (Signed)
  Problem: Education: Goal: Knowledge of General Education information will improve Description: Including pain rating scale, medication(s)/side effects and non-pharmacologic comfort measures Outcome: Not Progressing   Problem: Health Behavior/Discharge Planning: Goal: Ability to manage health-related needs will improve Outcome: Not Progressing   Problem: Clinical Measurements: Goal: Ability to maintain clinical measurements within normal limits will improve Outcome: Not Progressing Goal: Diagnostic test results will improve Outcome: Not Progressing   Problem: Nutrition: Goal: Adequate nutrition will be maintained Outcome: Not Progressing   Problem: Coping: Goal: Level of anxiety will decrease Outcome: Not Progressing   Problem: Elimination: Goal: Will not experience complications related to bowel motility Outcome: Not Progressing Goal: Will not experience complications related to urinary retention Outcome: Not Progressing   Problem: Skin Integrity: Goal: Risk for impaired skin integrity will decrease Outcome: Not Progressing

## 2022-08-30 NOTE — Group Note (Signed)
Recreation Therapy Group Note   Group Topic:Healthy Support Systems  Group Date: 08/30/2022 Start Time: 1400 End Time: 1440 Facilitators: Rosina Lowenstein, LRT, CTRS Location:  Dayroom  Group Description: Straw Bridge. In groups of 3, patients were given 10 plastic drinking straws and an equal length of masking tape. Using the materials provided, in pairs patients were instructed to build a free-standing bridge-like structure to suspend an everyday item (ex: deck of cards) off the floor or table surface. All materials were required to be used in Secondary school teacher. LRT facilitated post-activity discussion reviewing the importance of having strong and healthy support systems in our lives. LRT discussed how the people in our lives serve as the tape and the book we placed on top of our straw structure are the stressors we face in daily life. LRT and pts discussed what happens in our life when things get too heavy for [place], and we don't have strong supports outside of the hospital. Pt shared 2 of their healthy supports aloud in the group.   Goal Area(s) Addressed:  Patient will identify 2 healthy supports in their life. Patient will identify skills to successfully complete activity. Patient will identify correlation of this activity to life post-discharge.   Affect/Mood: Full range   Participation Level: Minimal   Participation Quality: Minimal Cues   Behavior: Distracted and Disinterested   Speech/Thought Process: Disorganized   Insight: Limited   Judgement: Limited   Modes of Intervention: Activity   Patient Response to Interventions:  Disengaged   Education Outcome:  In group clarification offered    Clinical Observations/Individualized Feedback: Betty Henderson was minimally active in their participation of session activities and group discussion. Pt identified "my grand babies" as her healthy supports. Pt spent the time in group talking to herself, and at one point got very loud and irritated. Pt  was able to clam herself down quickly after. Pt did not engage with peers or help in the making of the structure, however responded to LRT when asked a direct question.    Plan: Continue to engage patient in RT group sessions 2-3x/week.   Rosina Lowenstein, LRT, CTRS 08/30/2022 3:06 PM

## 2022-08-30 NOTE — [facility] Group Notes (Signed)
[facility] Group Notes:  (Nursing/MHT/Case Management/Adjunct)  Date:  08/30/2022  Time:  10:17 AM  Type of Therapy:  Movement Therapy  Participation Level:  Did Not Attend    Rodena Goldmann 08/30/2022, 10:17 AM

## 2022-08-30 NOTE — Progress Notes (Signed)
   08/30/22 0719  Psych Admission Type (Psych Patients Only)  Admission Status Voluntary  Psychosocial Assessment  Patient Complaints None  Eye Contact Brief  Facial Expression Flat  Affect Flat  Speech Soft  Interaction Minimal  Motor Activity Slow  Appearance/Hygiene In scrubs  Behavior Characteristics Calm  Mood Labile  Thought Process  Coherency Disorganized  Content WDL  Delusions None reported or observed  Perception WDL  Hallucination None reported or observed  Judgment Impaired  Confusion Mild  Danger to Self  Current suicidal ideation? Denies  Danger to Others  Danger to Others None reported or observed

## 2022-08-30 NOTE — Progress Notes (Signed)
[facility] MD Progress Note  08/30/2022 1:24 PM Betty Henderson  MRN:  [id number] Subjective: Betty Henderson is seen on rounds.  She has been in good controls for the most part.  She has her moments of being agitated and yelling out but otherwise she has been compliant with medications without any side effects.  Going through her chart she seems to be pretty much at baseline but is currently homeless.  She has had long-acting injections in the past from other facilities.  That would probably be appropriate in the future.  She is less internally preoccupied.  She denies any depression or suicidal ideation.  Principal Problem: Schizoaffective disorder, bipolar type (HCC) Diagnosis: Principal Problem:   Schizoaffective disorder, bipolar type (HCC)  Total Time spent with patient: 15 minutes  Past Psychiatric History: Long history of schizophrenia and hospitalization.  Past Medical History:  Past Medical History:  Diagnosis Date   Anemia    Arthritis    Chronic pain    Drug-seeking behavior    Malingering    Osteopetrosis    Psychosis (HCC)    Schizoaffective disorder, bipolar type (HCC)     Past Surgical History:  Procedure Laterality Date   MOUTH SURGERY     TUBAL LIGATION     Family History:  Family History  Family history unknown: Yes   Family Psychiatric  History: Unremarkable Social History:  Social History   Substance and Sexual Activity  Alcohol Use Not Currently   Comment: 1 cocktail 3 weeks ago     Social History   Substance and Sexual Activity  Drug Use Not Currently   Types: Cocaine   Comment: states "it's legal though"    Social History   Socioeconomic History   Marital status: Single    Spouse name: Not on file   Number of children: Not on file   Years of education: Not on file   Highest education level: Not on file  Occupational History   Not on file  Tobacco Use   Smoking status: Every Day    Packs/day: .5    Types: Cigarettes   Smokeless tobacco: Never   Vaping Use   Vaping Use: Never used  Substance and Sexual Activity   Alcohol use: Not Currently    Comment: 1 cocktail 3 weeks ago   Drug use: Not Currently    Types: Cocaine    Comment: states "it's legal though"   Sexual activity: Never  Other Topics Concern   Not on file  Social History Narrative   Not on file   Social Determinants of Health   Financial Resource Strain: Not on file  Food Insecurity: Food Insecurity Present (08/22/2022)   Hunger Vital Sign    Worried About Running Out of Food in the Last Year: Sometimes true    Ran Out of Food in the Last Year: Sometimes true  Transportation Needs: Patient Unable To Answer (08/22/2022)   PRAPARE - Administrator, Civil Service (Medical): Patient unable to answer    Lack of Transportation (Non-Medical): Patient unable to answer  Physical Activity: Not on file  Stress: Not on file  Social Connections: Not on file   Additional Social History:                         Sleep: Good  Appetite:  Good  Current Medications: Current Facility-Administered Medications  Medication Dose Route Frequency Provider Last Rate Last Admin   acetaminophen (TYLENOL) tablet 650 mg  650 mg Oral Q6H PRN [name], DO   650 mg at 08/24/22 0110   alum & mag hydroxide-simeth (MAALOX/MYLANTA) 200-200-20 MG/5ML suspension 30 mL  30 mL Oral Q4H PRN [name], DO       chlorproMAZINE (THORAZINE) tablet 50 mg  50 mg Oral QHS [name], DO   50 mg at 08/29/22 2334   diphenhydrAMINE (BENADRYL) capsule 50 mg  50 mg Oral Q6H PRN [name], DO   50 mg at 08/28/22 2339   Or   diphenhydrAMINE (BENADRYL) injection 50 mg  50 mg Intramuscular Q6H PRN [name], DO       haloperidol (HALDOL) tablet 5 mg  5 mg Oral Q6H PRN [name], DO   5 mg at 08/30/22 8119   Or   haloperidol lactate (HALDOL) injection 5 mg  5 mg Intramuscular Q6H PRN [name],  DO       LORazepam (ATIVAN) tablet 1 mg  1 mg Oral TID PRN [name], DO   1 mg at 08/30/22 0804   magnesium hydroxide (MILK OF MAGNESIA) suspension 30 mL  30 mL Oral Daily PRN [name], DO       OLANZapine [facility]) tablet 10 mg  10 mg Oral [id number] [name], DO   10 mg at 08/30/22 0804   traZODone (DESYREL) tablet 50 mg  50 mg Oral QHS PRN [name], DO   50 mg at 08/29/22 2334    Lab Results: No results found for this or any previous visit (from the past 48 hour(s)).  Blood Alcohol level:  Lab Results  Component Value Date   ETH <10 08/17/2022   ETH <10 01/11/2021    Metabolic Disorder Labs: No results found for: "HGBA1C", "MPG" No results found for: "PROLACTIN" No results found for: "CHOL", "TRIG", "HDL", "CHOLHDL", "VLDL", "LDLCALC"  Physical Findings: AIMS:  , ,  ,  ,    CIWA:    COWS:     Musculoskeletal: Strength & Muscle Tone: within normal limits Gait & Station: normal Patient leans: N/A  Psychiatric Specialty Exam:  Presentation  General Appearance:  Disheveled (Atypical interpersonal style)  Eye Contact: Other (comment) (Variable to brief)  Speech: Other (comment) (Improved, mostly clear, intermittently garbled)  Speech Volume: Normal  Handedness: Right   Mood and Affect  Mood: -- ("Good")  Affect: Other (comment) (Neutral if mildly constricted)   Thought Process  Thought Processes: Other (comment) (superficially linear, concrete)  Descriptions of Associations:Intact  Orientation:Other (comment) (Grossly intact)  Thought Content:Other (comment) (Superficially logical, short)  History of Schizophrenia/Schizoaffective disorder:No data recorded Duration of Psychotic Symptoms:No data recorded Hallucinations:No data recorded Ideas of Reference:None  Suicidal Thoughts:No data recorded Homicidal Thoughts:No data recorded  Sensorium  Memory: Immediate Poor; Remote  Poor  Judgment: Impaired  Insight: Poor   Executive Functions  Concentration: Poor  Attention Span: Poor  Recall: Poor  Fund of Knowledge: Poor  Language: Fair   Psychomotor Activity  Psychomotor Activity:No data recorded  Assets  Assets: Physical Health; Desire for Improvement   Sleep  Sleep:No data recorded   Physical Exam: Physical Exam Vitals and nursing note reviewed.  Constitutional:      Appearance: Normal appearance. She is normal weight.  Neurological:     General: No focal deficit present.     Mental Status: She is alert and oriented to person, place, and time.  Psychiatric:        Attention and Perception:  Attention normal. She perceives auditory hallucinations.        Mood and Affect: Mood normal. Affect is labile.        Speech: Speech is tangential.        Behavior: Behavior normal. Behavior is cooperative.        Thought Content: Thought content is paranoid and delusional.        Cognition and Memory: Cognition and memory normal.        Judgment: Judgment is inappropriate.    Review of Systems  Constitutional: Negative.   HENT: Negative.    Eyes: Negative.   Respiratory: Negative.    Cardiovascular: Negative.   Gastrointestinal: Negative.   Genitourinary: Negative.   Musculoskeletal: Negative.   Skin: Negative.   Neurological: Negative.   Endo/Heme/Allergies: Negative.   Psychiatric/Behavioral:  Positive for hallucinations.    Blood pressure 106/68, pulse 81, temperature 98.3 F (36.8 C), resp. rate 15, height 5\' 6"  (1.676 m), weight 55.3 kg, SpO2 100 %. Body mass index is 19.69 kg/m.   Treatment Plan Summary: Daily contact with patient to assess and evaluate symptoms and progress in treatment, Medication management, and Plan continue current medications.  Harith Mccadden Tresea Mall, DO 08/30/2022, 1:24 PM

## 2022-08-30 NOTE — Progress Notes (Signed)
Patient A&Ox4. Patient denies SI/Hi and AVH. Denies any physical complaints when asked. No acute distress noted. Support and encouragement provided. Routine safety checks conducted according to facility protocol. Encouraged patient to notify staff if thoughts of harm toward self or others arise. Patient verbalize understanding and agreement. Will continue to monitor for safety. 

## 2022-08-31 NOTE — Plan of Care (Signed)

## 2022-08-31 NOTE — Group Note (Signed)
Date:  08/31/2022 Time:  3:43 PM  Group Topic/Focus:  Overcoming Stress:   The focus of this group is to define stress and help patients assess their triggers.    Participation Level:  None  Participation Quality:  Inattentive  Affect:  Irritable  Cognitive:  Disorganized, Confused, and Delusional  Insight: Lacking  Engagement in Group:  None  Modes of Intervention:  Limit-setting  Additional Comments:    [name] 08/31/2022, 3:43 PM

## 2022-08-31 NOTE — Progress Notes (Signed)
Patient pleasant this morning.  Reports she slept well.  Good appetite observed.  Shortly after breakfast patient became agitated when told she could not have crackers.  Patient is observed responding verbally to internal stimuli.  Compliant with scheduled medications.  15 min checks in place for safety.  Patient is disruptive to the milieu at times (cursing, speaking loudly).   Patient again became agitated when told it was too hot to go outside.  Patient began cursing and yelling. Able to be redirected verbally and calmed down.

## 2022-08-31 NOTE — Progress Notes (Signed)
The patient was pleasant and cooperative during the evening.  She watched television and accepted her medications as ordered.  Trazodone was provided for sleep.  The patient fell asleep before 2200, but awakened frequently during the night.  She was heard talking to herself in her room and was likely responding to internal stimuli.  No significant behavioral issues to note.

## 2022-08-31 NOTE — Group Note (Signed)
Date:  08/31/2022 Time:  8:57 PM  Group Topic/Focus:  Healthy Communication:   The focus of this group is to discuss communication, barriers to communication, as well as healthy ways to communicate with others.    Participation Level:  Did Not Attend  Participation Quality:   Did Not Attend  Affect:   Did Not Attend  Cognitive:   Did Not Attend  Insight: None  Engagement in Group:  None  Modes of Intervention:  Socialization  Additional Comments:    [name] 08/31/2022, 8:57 PM

## 2022-08-31 NOTE — [facility] Group Notes (Signed)
[facility] Group Notes:  (Nursing/MHT/Case Management/Adjunct)  Date:  08/31/2022  Time:  2:54 PM  Type of Therapy:  Music Therapy  Participation Level:  Active  Participation Quality:  Appropriate  Affect:  Appropriate  Cognitive:  Appropriate  Insight:  Appropriate  Engagement in Group:  Engaged  Modes of Intervention:  Activity  Summary of Progress/Problems:  Rodena Goldmann 08/31/2022, 2:54 PM

## 2022-08-31 NOTE — Progress Notes (Signed)
[facility] MD Progress Note  08/31/2022 2:03 PM Betty Henderson  MRN:  [id number] Subjective:  Patient pleasant this morning.  Reports she slept well.  Good appetite observed.  Shortly after breakfast patient became agitated when told she could not have crackers.  Patient is observed responding verbally to internal stimuli.  Compliant with scheduled medications.  15 min checks in place for safety.  Patient is disruptive to the milieu at times (cursing, speaking loudly).   Patient evaluated in dayroom today; she presents calm and pleasant during this interaction. She reports feeling better, has some paranoia and loose associations. She is compliant with her meds, slept well and has fair appetite. Earlier she was upset for not getting an extra soda. She is denying any sI/HI and seems like she still has some AH.  Principal Problem: Schizoaffective disorder, bipolar type (HCC) Diagnosis: Principal Problem:   Schizoaffective disorder, bipolar type (HCC)  Total Time spent with patient: 20 minutes  Past Psychiatric History: See H&P  Past Medical History:  Past Medical History:  Diagnosis Date   Anemia    Arthritis    Chronic pain    Drug-seeking behavior    Malingering    Osteopetrosis    Psychosis (HCC)    Schizoaffective disorder, bipolar type (HCC)     Past Surgical History:  Procedure Laterality Date   MOUTH SURGERY     TUBAL LIGATION     Family History:  Family History  Family history unknown: Yes   Family Psychiatric  History:  Social History:  Social History   Substance and Sexual Activity  Alcohol Use Not Currently   Comment: 1 cocktail 3 weeks ago     Social History   Substance and Sexual Activity  Drug Use Not Currently   Types: Cocaine   Comment: states "it's legal though"    Social History   Socioeconomic History   Marital status: Single    Spouse name: Not on file   Number of children: Not on file   Years of education: Not on file   Highest education level:  Not on file  Occupational History   Not on file  Tobacco Use   Smoking status: Every Day    Packs/day: .5    Types: Cigarettes   Smokeless tobacco: Never  Vaping Use   Vaping Use: Never used  Substance and Sexual Activity   Alcohol use: Not Currently    Comment: 1 cocktail 3 weeks ago   Drug use: Not Currently    Types: Cocaine    Comment: states "it's legal though"   Sexual activity: Never  Other Topics Concern   Not on file  Social History Narrative   Not on file   Social Determinants of Health   Financial Resource Strain: Not on file  Food Insecurity: Food Insecurity Present (08/22/2022)   Hunger Vital Sign    Worried About Running Out of Food in the Last Year: Sometimes true    Ran Out of Food in the Last Year: Sometimes true  Transportation Needs: Patient Unable To Answer (08/22/2022)   PRAPARE - Administrator, Civil Service (Medical): Patient unable to answer    Lack of Transportation (Non-Medical): Patient unable to answer  Physical Activity: Not on file  Stress: Not on file  Social Connections: Not on file   Additional Social History:                         Sleep: Fair  Appetite:  Fair  Current Medications: Current Facility-Administered Medications  Medication Dose Route Frequency Provider Last Rate Last Admin   acetaminophen (TYLENOL) tablet 650 mg  650 mg Oral Q6H PRN [name], DO   650 mg at 08/24/22 0110   alum & mag hydroxide-simeth (MAALOX/MYLANTA) 200-200-20 MG/5ML suspension 30 mL  30 mL Oral Q4H PRN [name], DO       chlorproMAZINE (THORAZINE) tablet 50 mg  50 mg Oral QHS [name], DO   50 mg at 08/30/22 2126   diphenhydrAMINE (BENADRYL) capsule 50 mg  50 mg Oral Q6H PRN [name], DO   50 mg at 08/28/22 2339   Or   diphenhydrAMINE (BENADRYL) injection 50 mg  50 mg Intramuscular Q6H PRN [name], DO       haloperidol (HALDOL) tablet 5 mg  5 mg Oral Q6H  PRN [name], DO   5 mg at 08/30/22 2952   Or   haloperidol lactate (HALDOL) injection 5 mg  5 mg Intramuscular Q6H PRN [name], DO       LORazepam (ATIVAN) tablet 1 mg  1 mg Oral TID PRN [name], DO   1 mg at 08/30/22 0804   magnesium hydroxide (MILK OF MAGNESIA) suspension 30 mL  30 mL Oral Daily PRN [name], DO       OLANZapine [facility]) tablet 10 mg  10 mg Oral BH-[id number] [name], DO   10 mg at 08/31/22 0753   traZODone (DESYREL) tablet 50 mg  50 mg Oral QHS PRN [name], DO   50 mg at 08/30/22 2126    Lab Results: No results found for this or any previous visit (from the past 48 hour(s)).  Blood Alcohol level:  Lab Results  Component Value Date   ETH <10 08/17/2022   ETH <10 01/11/2021    Metabolic Disorder Labs: No results found for: "HGBA1C", "MPG" No results found for: "PROLACTIN" No results found for: "CHOL", "TRIG", "HDL", "CHOLHDL", "VLDL", "LDLCALC"  Physical Findings: AIMS:  , ,  ,  ,    CIWA:    COWS:     Musculoskeletal: Strength & Muscle Tone: within normal limits Gait & Station: normal Patient leans: N/A  Psychiatric Specialty Exam:  Presentation  General Appearance:  Disheveled (Atypical interpersonal style)  Eye Contact: Other (comment) (Variable to brief)  Speech: Other (comment) (Improved, mostly clear, intermittently garbled)  Speech Volume: Normal  Handedness: Right   Mood and Affect  Mood: -- ("Good") Labile Affect: Other (comment) (Neutral if mildly constricted) Congruent  Thought Process  Thought Processes: Other (comment) (superficially linear, concrete) Tangential  Descriptions of Associations:Loose Orientation:Other (comment) (Grossly intact)  Thought Content:Other (comment) (Superficially logical, short) Paranoia History of Schizophrenia/Schizoaffective disorder:No data recorded Duration of Psychotic Symptoms:No data  recorded Hallucinations: AH Ideas of Reference: Suicidal Thoughts:Denies  Homicidal Thoughts:Denies   Sensorium  Memory: Immediate Poor; Remote Poor  Judgment: Impaired  Insight: Poor   Executive Functions  Concentration: Poor  Attention Span: Poor  Recall: Poor  Fund of Knowledge: Poor  Language: Fair   Psychomotor Activity  Psychomotor Activity:No data recorded  Assets  Assets: Physical Health; Desire for Improvement   Sleep  Sleep:No data recorded   Physical Exam: Physical Exam Constitutional:      Appearance: Normal appearance. She is normal weight.  HENT:     Head: Normocephalic and atraumatic.     Right Ear: External ear normal.  Left Ear: External ear normal.     Nose: Nose normal.     Mouth/Throat:     Mouth: Mucous membranes are moist.  Eyes:     Conjunctiva/sclera: Conjunctivae normal.     Pupils: Pupils are equal, round, and reactive to light.  Cardiovascular:     Rate and Rhythm: Normal rate and regular rhythm.     Pulses: Normal pulses.     Heart sounds: Normal heart sounds.  Pulmonary:     Effort: Pulmonary effort is normal.     Breath sounds: Normal breath sounds.  Abdominal:     General: Abdomen is flat. Bowel sounds are normal.     Palpations: Abdomen is soft.  Musculoskeletal:     Cervical back: Normal range of motion.  Skin:    General: Skin is warm.  Neurological:     General: No focal deficit present.     Mental Status: She is alert.    Review of Systems  Constitutional: Negative.   HENT: Negative.    Eyes: Negative.   Respiratory: Negative.    Cardiovascular: Negative.   Gastrointestinal: Negative.   Genitourinary: Negative.   Musculoskeletal: Negative.   Skin: Negative.   Neurological: Negative.   Endo/Heme/Allergies: Negative.   Psychiatric/Behavioral:  Positive for hallucinations. The patient is nervous/anxious.    Blood pressure (!) 112/96, pulse 100, temperature 98 F (36.7 C), resp. rate 16,  height 5\' 6"  (1.676 m), weight 55.3 kg, SpO2 100 %. Body mass index is 19.69 kg/m.   Treatment Plan Summary: Daily contact with patient to assess and evaluate symptoms and progress in treatment, Medication management, and Plan : Continue current meds and doses.   Shala Baumbach 08/31/2022, 2:03 PM

## 2022-09-01 NOTE — [facility] Group Notes (Signed)
Time: 10:00am-11:30   Group Topic/Focus:  Healthy Communication: Support systems communication  Love Languages      Participation Level:  Active   Participation Quality:  Appropriate   Affect:  Appropriate   Cognitive:  Appropriate   Insight: Good   Engagement in Group:  Engaged   Modes of Intervention:  Education   Additional Comments:     

## 2022-09-01 NOTE — Progress Notes (Addendum)
The patient briefly watched television, but spent the majority of the evening in her room.  Early in the evening, she was heard loudly talking to herself and occasionally became agitated.  Betty Henderson was provided PRN Haldol and Ativan, along with her nighttime medications at 2041, with good effect.  Instances of anger and verbal outbursts decreased and the patient fell asleep at around 2130.  She awakened intermittently throughout the night, but was able to manage her behavior and did not disturb others.  The patient remains disorganized and continues to respond to auditory hallucinations.  Judgment is impaired, but no unsafe behaviors were observed.  Mood and affect were irritable.            The patient continues to struggle with insomnia and poor sleep quality.

## 2022-09-01 NOTE — Group Note (Signed)
Date:  09/01/2022 Time:  9:46 PM  Group Topic/Focus:  Building Self Esteem:   The Focus of this group is helping patients become aware of the effects of self-esteem on their lives, the things they and others do that enhance or undermine their self-esteem, seeing the relationship between their level of self-esteem and the choices they make and learning ways to enhance self-esteem. Coping With Mental Health Crisis:   The purpose of this group is to help patients identify strategies for coping with mental health crisis.  Group discusses possible causes of crisis and ways to manage them effectively. Conflict Resolution:   The focus of this group is to discuss the conflict resolution process and how it may be used upon discharge. Developing a Wellness Toolbox:   The focus of this group is to help patients develop a "wellness toolbox" with skills and strategies to promote recovery upon discharge. Early Warning Signs:   The focus of this group is to help patients identify signs or symptoms they exhibit before slipping into an unhealthy state or crisis. Emotional Education:   The focus of this group is to discuss what feelings/emotions are, and how they are experienced. Goals Group:   The focus of this group is to help patients establish daily goals to achieve during treatment and discuss how the patient can incorporate goal setting into their daily lives to aide in recovery. Healthy Communication:   The focus of this group is to discuss communication, barriers to communication, as well as healthy ways to communicate with others. Identifying Needs:   The focus of this group is to help patients identify their personal needs that have been historically problematic and identify healthy behaviors to address their needs. Making Healthy Choices:   The focus of this group is to help patients identify negative/unhealthy choices they were using prior to admission and identify positive/healthier coping strategies to  replace them upon discharge. Managing Feelings:   The focus of this group is to identify what feelings patients have difficulty handling and develop a plan to handle them in a healthier way upon discharge. Overcoming Stress:   The focus of this group is to define stress and help patients assess their triggers. Personal Choices and Values:   The focus of this group is to help patients assess and explore the importance of values in their lives, how their values affect their decisions, how they express their values and what opposes their expression.    Participation Level:  Minimal  Participation Quality:  Attentive  Affect:  Appropriate and Excited  Cognitive:  Oriented and Delusional  Insight: Improving  Engagement in Group:  Improving  Modes of Intervention:  Discussion  Additional Comments:    Betty Henderson 09/01/2022, 9:46 PM

## 2022-09-01 NOTE — Progress Notes (Signed)
D- Patient alert and oriented x 1. Affect irritable/mood labile. Responding to auditory hallucinations speaking out loudly at times, unable to decipher what she is saying then she redirects herself or staff redirects and deescalate the situation. She ambulates with care without assistance. She is independent with ADL's. She has no complaints or requests.  A- Scheduled medications administered to patient, per MD orders.   Routine safety checks conducted every 15 minutes without incident.  R- No adverse drug reactions noted.  Patient compliant with medications and treatment plan. She spent most of the day in dayroom watching TV. She remains safe on the unit at this time.

## 2022-09-01 NOTE — Progress Notes (Signed)
[facility] MD Progress Note  09/01/2022 11:58 AM Betty Henderson  MRN:  [id number] Subjective:  The patient briefly watched television, but spent the majority of the evening in her room.  Early in the evening, she was heard loudly talking to herself and occasionally became agitated.  Betty Henderson was provided PRN Haldol and Ativan, along with her nighttime medications at 2041, with good effect.  Instances of anger and verbal outbursts decreased and the patient fell asleep at around 2130.  She awakened intermittently throughout the night, but was able to manage her behavior and did not disturb others.  The patient remains disorganized and continues to respond to auditory hallucinations.  Judgment is impaired, but no unsafe behaviors were observed.  Mood and affect were irritable.The patient continues to struggle with insomnia and poor sleep quality.     Patient has had a good today so far; she had breakfast and lunch and has been attending groups. She reports feeling well now, is denying any SI/HI and says no to AVH too. She is a bit disorganized and can be unpredictable.   Principal Problem: Schizoaffective disorder, bipolar type (HCC) Diagnosis: Principal Problem:   Schizoaffective disorder, bipolar type (HCC)  Total Time spent with patient: 20 minutes  Past Psychiatric History:   Past Medical History:  Past Medical History:  Diagnosis Date   Anemia    Arthritis    Chronic pain    Drug-seeking behavior    Malingering    Osteopetrosis    Psychosis (HCC)    Schizoaffective disorder, bipolar type (HCC)     Past Surgical History:  Procedure Laterality Date   MOUTH SURGERY     TUBAL LIGATION     Family History:  Family History  Family history unknown: Yes   Family Psychiatric  History:  Social History:  Social History   Substance and Sexual Activity  Alcohol Use Not Currently   Comment: 1 cocktail 3 weeks ago     Social History   Substance and Sexual Activity  Drug Use Not Currently    Types: Cocaine   Comment: states "it's legal though"    Social History   Socioeconomic History   Marital status: Single    Spouse name: Not on file   Number of children: Not on file   Years of education: Not on file   Highest education level: Not on file  Occupational History   Not on file  Tobacco Use   Smoking status: Every Day    Packs/day: .5    Types: Cigarettes   Smokeless tobacco: Never  Vaping Use   Vaping Use: Never used  Substance and Sexual Activity   Alcohol use: Not Currently    Comment: 1 cocktail 3 weeks ago   Drug use: Not Currently    Types: Cocaine    Comment: states "it's legal though"   Sexual activity: Never  Other Topics Concern   Not on file  Social History Narrative   Not on file   Social Determinants of Health   Financial Resource Strain: Not on file  Food Insecurity: Food Insecurity Present (08/22/2022)   Hunger Vital Sign    Worried About Running Out of Food in the Last Year: Sometimes true    Ran Out of Food in the Last Year: Sometimes true  Transportation Needs: Patient Unable To Answer (08/22/2022)   PRAPARE - Transportation    Lack of Transportation (Medical): Patient unable to answer    Lack of Transportation (Non-Medical): Patient unable to answer  Physical Activity: Not on file  Stress: Not on file  Social Connections: Not on file   Additional Social History:                         Sleep: Fair  Appetite:  Fair  Current Medications: Current Facility-Administered Medications  Medication Dose Route Frequency Provider Last Rate Last Admin   acetaminophen (TYLENOL) tablet 650 mg  650 mg Oral Q6H PRN [name], DO   650 mg at 08/24/22 0110   alum & mag hydroxide-simeth (MAALOX/MYLANTA) 200-200-20 MG/5ML suspension 30 mL  30 mL Oral Q4H PRN [name], DO       chlorproMAZINE (THORAZINE) tablet 50 mg  50 mg Oral QHS [name], DO   50 mg at 08/31/22 2041   diphenhydrAMINE  (BENADRYL) capsule 50 mg  50 mg Oral Q6H PRN [name], DO   50 mg at 08/31/22 2040   Or   diphenhydrAMINE (BENADRYL) injection 50 mg  50 mg Intramuscular Q6H PRN [name], DO       haloperidol (HALDOL) tablet 5 mg  5 mg Oral Q6H PRN [name], DO   5 mg at 08/31/22 2040   Or   haloperidol lactate (HALDOL) injection 5 mg  5 mg Intramuscular Q6H PRN [name], DO       LORazepam (ATIVAN) tablet 1 mg  1 mg Oral TID PRN [name], DO   1 mg at 08/30/22 0804   magnesium hydroxide (MILK OF MAGNESIA) suspension 30 mL  30 mL Oral Daily PRN [name], DO       OLANZapine (ZYPREXA) tablet 10 mg  10 mg Oral BH-[id number] [name], DO   10 mg at 09/01/22 5409   traZODone (DESYREL) tablet 50 mg  50 mg Oral QHS PRN [name], DO   50 mg at 08/31/22 2041    Lab Results: No results found for this or any previous visit (from the past 48 hour(s)).  Blood Alcohol level:  Lab Results  Component Value Date   ETH <10 08/17/2022   ETH <10 01/11/2021    Metabolic Disorder Labs: No results found for: "HGBA1C", "MPG" No results found for: "PROLACTIN" No results found for: "CHOL", "TRIG", "HDL", "CHOLHDL", "VLDL", "LDLCALC"  Physical Findings: AIMS:  , ,  ,  ,    CIWA:    COWS:     Musculoskeletal: Strength & Muscle Tone: within normal limits Gait & Station: normal Patient leans: N/A  Psychiatric Specialty Exam:  Presentation  General Appearance:  Casual; Neat  Eye Contact: Fair  Speech: Slow; Slurred  Speech Volume: Decreased  Handedness: Right   Mood and Affect  Mood: Anxious  Affect: Inappropriate   Thought Process  Thought Processes: Disorganized  Descriptions of Associations:Loose  Orientation:Partial  Thought Content:Illogical; Rumination  History of Schizophrenia/Schizoaffective disorder:No data recorded Duration of Psychotic Symptoms:No data  recorded Hallucinations:Hallucinations: None  Ideas of Reference:Paranoia  Suicidal Thoughts:Suicidal Thoughts: No  Homicidal Thoughts:Homicidal Thoughts: No   Sensorium  Memory: Immediate Fair; Remote Poor  Judgment: Poor  Insight: Poor   Executive Functions  Concentration: Poor  Attention Span: Fair  Recall: [employer] of Knowledge: Fair  Language: Fair   Psychomotor Activity  Psychomotor Activity:Psychomotor Activity: Normal   Assets  Assets: Communication Skills   Sleep  Sleep:Sleep: Fair    Physical Exam: Physical Exam ROS Blood pressure 99/66, pulse 71, temperature (!) 97.3 F (  36.3 C), resp. rate 16, height 5\' 6"  (1.676 m), weight 55.3 kg, SpO2 100 %. Body mass index is 19.69 kg/m.   Treatment Plan Summary: Daily contact with patient to assess and evaluate symptoms and progress in treatment, Medication management, and Plan : Continue current medicines and doses.   Leo Rod 09/01/2022, 11:58 AM

## 2022-09-01 NOTE — Plan of Care (Signed)
  Problem: Education: Goal: Knowledge of General Education information will improve Description: Including pain rating scale, medication(s)/side effects and non-pharmacologic comfort measures Outcome: Not Progressing   Problem: Health Behavior/Discharge Planning: Goal: Ability to manage health-related needs will improve Outcome: Not Progressing   Problem: Clinical Measurements: Goal: Ability to maintain clinical measurements within normal limits will improve Outcome: Not Progressing Goal: Diagnostic test results will improve Outcome: Not Progressing   Problem: Nutrition: Goal: Adequate nutrition will be maintained Outcome: Not Progressing   Problem: Coping: Goal: Level of anxiety will decrease Outcome: Not Progressing   Problem: Elimination: Goal: Will not experience complications related to bowel motility Outcome: Not Progressing Goal: Will not experience complications related to urinary retention Outcome: Not Progressing   Problem: Pain Managment: Goal: General experience of comfort will improve Outcome: Not Progressing   Problem: Safety: Goal: Ability to remain free from injury will improve Outcome: Not Progressing   Problem: Skin Integrity: Goal: Risk for impaired skin integrity will decrease Outcome: Not Progressing   

## 2022-09-01 NOTE — Group Note (Signed)
LCSW Group Therapy Note  Group Date: 09/01/2022 Start Time: 1318 End Time: 1348   Type of Therapy and Topic:  Group Therapy - Unhealthy Coping Skills  Participation Level:  Minimal   Description of Group The focus of this group was to determine what unhealthy coping techniques typically are used by group members and what healthy coping techniques would be helpful in coping with various problems. Patients were guided in becoming aware of the differences between healthy and unhealthy coping techniques. Patients were asked to identify 2-3 healthy coping skills they would like to learn to use more effectively.  Therapeutic Goals Patients learned that coping is what human beings do all day long to deal with various situations in their lives Patients defined and discussed healthy vs unhealthy coping techniques Patients identified their preferred coping techniques and identified whether these were healthy or unhealthy Patients determined 2-3 healthy coping skills they would like to become more familiar with and use more often. Patients provided support and ideas to each other   Summary of Patient Progress:  The patient attended group. The patient played the coping skills bingo game but did not participate in the discussion.   Marshell Levan, LCSWA 09/01/2022  1:56 PM

## 2022-09-02 DIAGNOSIS — F250 Schizoaffective disorder, bipolar type: Secondary | ICD-10-CM | POA: Diagnosis not present

## 2022-09-02 MED ORDER — PALIPERIDONE ER 3 MG PO TB24
6.0000 mg | ORAL_TABLET | Freq: Every day | ORAL | Status: DC
  Administered 2022-09-02: 6 mg via ORAL
  Filled 2022-09-02: qty 2

## 2022-09-02 NOTE — Progress Notes (Signed)
Pt took 1600 medication without difficulty. Pt is not responding to internal stimuli at this time. Pt is in her room and quiet for the present.

## 2022-09-02 NOTE — Group Note (Signed)
LCSW Group Therapy Note  Group Date: 09/02/2022 Start Time: 1305 End Time: 1400   Type of Therapy and Topic:  Group Therapy - Healthy vs Unhealthy Coping Skills  Participation Level:  Did Not Attend   Description of Group The focus of this group was to determine what unhealthy coping techniques typically are used by group members and what healthy coping techniques would be helpful in coping with various problems. Patients were guided in becoming aware of the differences between healthy and unhealthy coping techniques. Patients were asked to identify 2-3 healthy coping skills they would like to learn to use more effectively.  Therapeutic Goals Patients learned that coping is what human beings do all day long to deal with various situations in their lives Patients defined and discussed healthy vs unhealthy coping techniques Patients identified their preferred coping techniques and identified whether these were healthy or unhealthy Patients determined 2-3 healthy coping skills they would like to become more familiar with and use more often. Patients provided support and ideas to each other   Summary of Patient Progress:  X  Therapeutic Modalities Cognitive Behavioral Therapy Motivational Interviewing  Harden Mo, LCSWA 09/02/2022  2:00 PM

## 2022-09-02 NOTE — Progress Notes (Signed)
   09/01/22 2000  Psych Admission Type (Psych Patients Only)  Admission Status Voluntary  Psychosocial Assessment  Patient Complaints Irritability  Eye Contact Brief  Facial Expression Animated  Affect Irritable  Speech Tangential;Loud  Interaction Forwards little;Poor  Motor Activity Slow  Appearance/Hygiene In scrubs  Behavior Characteristics Irritable  Mood Labile  Thought Process  Coherency Disorganized  Content Preoccupation  Delusions WDL  Perception Hallucinations  Hallucination Auditory  Judgment Impaired  Confusion Mild  Danger to Self  Current suicidal ideation? Denies  Danger to Others  Danger to Others None reported or observed   Patient compliant with medications no adverse effects noted. Responding to internal stimuli. Support and encouragement provided.

## 2022-09-02 NOTE — BH IP Treatment Plan (Signed)
Interdisciplinary Treatment and Diagnostic Plan Update  09/02/2022 Time of Session: 10:00AM Betty Henderson MRN: [id number]  Principal Diagnosis: Schizoaffective disorder, bipolar type [place] [facility])  Secondary Diagnoses: Principal Problem:   Schizoaffective disorder, bipolar type (HCC)   Current Medications:  Current Facility-Administered Medications  Medication Dose Route Frequency Provider Last Rate Last Admin   acetaminophen (TYLENOL) tablet 650 mg  650 mg Oral Q6H PRN [name], DO   650 mg at 08/24/22 0110   alum & mag hydroxide-simeth (MAALOX/MYLANTA) 200-200-20 MG/5ML suspension 30 mL  30 mL Oral Q4H PRN [name], DO       chlorproMAZINE (THORAZINE) tablet 50 mg  50 mg Oral QHS [name], DO   50 mg at 09/01/22 2117   diphenhydrAMINE (BENADRYL) capsule 50 mg  50 mg Oral Q6H PRN [name], DO   50 mg at 08/31/22 2040   Or   diphenhydrAMINE (BENADRYL) injection 50 mg  50 mg Intramuscular Q6H PRN [name], DO       haloperidol (HALDOL) tablet 5 mg  5 mg Oral Q6H PRN [name], DO   5 mg at 08/31/22 2040   Or   haloperidol lactate (HALDOL) injection 5 mg  5 mg Intramuscular Q6H PRN [name], DO       LORazepam (ATIVAN) tablet 1 mg  1 mg Oral TID PRN [name], DO   1 mg at 08/30/22 0804   magnesium hydroxide (MILK OF MAGNESIA) suspension 30 mL  30 mL Oral Daily PRN [name], DO       OLANZapine (ZYPREXA) tablet 10 mg  10 mg Oral BH-[id number] [name], DO   10 mg at 09/02/22 0757   traZODone (DESYREL) tablet 50 mg  50 mg Oral QHS PRN [name], DO   50 mg at 09/01/22 2117   PTA Medications: Medications Prior to Admission  Medication Sig Dispense Refill Last Dose   ondansetron (ZOFRAN) 4 MG tablet Take 1 tablet (4 mg total) by mouth every 8 (eight) hours as needed for nausea or vomiting. (Patient not taking: Reported on 08/17/2022) 20  tablet 0     Patient Stressors: Financial difficulties   Health problems   Medication change or noncompliance    Patient Strengths: Ability for insight   Treatment Modalities: Medication Management, Group therapy, Case management,  1 to 1 session with clinician, Psychoeducation, Recreational therapy.   Physician Treatment Plan for Primary Diagnosis: Schizoaffective disorder, bipolar type (HCC) Long Term Goal(s): Improvement in symptoms so as ready for discharge   Short Term Goals: Ability to identify changes in lifestyle to reduce recurrence of condition will improve Ability to verbalize feelings will improve Ability to disclose and discuss suicidal ideas Ability to demonstrate self-control will improve Ability to identify and develop effective coping behaviors will improve Ability to maintain clinical measurements within normal limits will improve Compliance with prescribed medications will improve Ability to identify triggers associated with substance abuse/mental health issues will improve  Medication Management: Evaluate patient's response, side effects, and tolerance of medication regimen.  Therapeutic Interventions: 1 to 1 sessions, Unit Group sessions and Medication administration.  Evaluation of Outcomes: Not Progressing  Physician Treatment Plan for Secondary Diagnosis: Principal Problem:   Schizoaffective disorder, bipolar type (HCC)  Long Term Goal(s): Improvement in symptoms so as ready for discharge   Short Term Goals: Ability to identify changes in lifestyle to reduce recurrence of condition will improve Ability to verbalize feelings will improve Ability  to disclose and discuss suicidal ideas Ability to demonstrate self-control will improve Ability to identify and develop effective coping behaviors will improve Ability to maintain clinical measurements within normal limits will improve Compliance with prescribed medications will improve Ability to identify  triggers associated with substance abuse/mental health issues will improve     Medication Management: Evaluate patient's response, side effects, and tolerance of medication regimen.  Therapeutic Interventions: 1 to 1 sessions, Unit Group sessions and Medication administration.  Evaluation of Outcomes: Not Progressing   RN Treatment Plan for Primary Diagnosis: Schizoaffective disorder, bipolar type (HCC) Long Term Goal(s): Knowledge of disease and therapeutic regimen to maintain health will improve  Short Term Goals: Ability to demonstrate self-control, Ability to participate in decision making will improve, Ability to verbalize feelings will improve, Ability to disclose and discuss suicidal ideas, Ability to identify and develop effective coping behaviors will improve, and Compliance with prescribed medications will improve  Medication Management: RN will administer medications as ordered by provider, will assess and evaluate patient's response and provide education to patient for prescribed medication. RN will report any adverse and/or side effects to prescribing provider.  Therapeutic Interventions: 1 on 1 counseling sessions, Psychoeducation, Medication administration, Evaluate responses to treatment, Monitor vital signs and CBGs as ordered, Perform/monitor CIWA, COWS, AIMS and Fall Risk screenings as ordered, Perform wound care treatments as ordered.  Evaluation of Outcomes: Not Progressing   LCSW Treatment Plan for Primary Diagnosis: Schizoaffective disorder, bipolar type (HCC) Long Term Goal(s): Safe transition to appropriate next level of care at discharge, Engage patient in therapeutic group addressing interpersonal concerns.  Short Term Goals: Engage patient in aftercare planning with referrals and resources, Increase social support, Increase ability to appropriately verbalize feelings, Increase emotional regulation, Facilitate acceptance of mental health diagnosis and concerns, and  Increase skills for wellness and recovery  Therapeutic Interventions: Assess for all discharge needs, 1 to 1 time with Social worker, Explore available resources and support systems, Assess for adequacy in community support network, Educate family and significant other(s) on suicide prevention, Complete Psychosocial Assessment, Interpersonal group therapy.  Evaluation of Outcomes: Not Progressing   Progress in Treatment: Attending groups: Yes. Participating in groups: No. Taking medication as prescribed: Yes. Toleration medication: Yes. Family/Significant other contact made: Yes, individual(s) contacted:  SPE completed with the patient's son. Patient understands diagnosis: No. Discussing patient identified problems/goals with staff: Yes. Medical problems stabilized or resolved: Yes. Denies suicidal/homicidal ideation: Yes. Issues/concerns per patient self-inventory: No. Other: none  New problem(s) identified: No, Describe:  none   New Short Term/Long Term Goal(s): detox, elimination of symptoms of psychosis, medication management for mood stabilization; elimination of SI thoughts; development of comprehensive mental wellness/sobriety plan.  Update 08/28/2022:  No changes at this time.  Update 09/02/2022: No changes at this time.    Patient Goals:  Patient was present in group. Patient too disorganized to provide a goal at this time. Update 08/28/2022:  No changes at this time. Update 09/02/2022: No changes at this time.    Discharge Plan or Barriers: CSW to assist patient in development of appropriate discharge plans.  Update 08/28/2022:  Patient remains psychotic at this time.  Progress has been limited.  Unable to identify placement for patient.  Discussions are being had on contacting DSS to make possible APS report for guardianship for patient. Update 09/02/2022: CSW spoke with the patients son who reports that patient is at baseline.  He reports that this has been his mothers disposition for 20  years.  He  reports that she does not have any supports and is currently homeless.  CSW to continue to assist in development of a safe discharge plan.   Reason for Continuation of Hospitalization: Aggression Delusions  Depression Hallucinations Medication stabilization   Estimated Length of Stay: Update 08/28/2022:  No changes at this time. Update 09/02/2022: TBD.   Last 3 [place] Suicide Severity Risk Score: Flowsheet Row Admission (Current) from 08/22/2022 in [facility] from 08/17/2022 in [facility] Department at [facility] ED from 08/12/2022 in [facility] Emergency Department at [facility]  C-SSRS RISK CATEGORY Low Risk No Risk No Risk       Last [facility] 2/9 Scores:     No data to display          Scribe for Treatment Team: Harden Mo, [place] 09/02/2022 10:56 AM

## 2022-09-02 NOTE — Progress Notes (Signed)
The patient remained in her room for the majority of the evening, but intermittently joined others in the day room to watch television.  She required frequent reminders from staff to lower her voice, as the patient's judgment and self-awareness were poor.  Betty Henderson's mood was irritable and angry.  She accepted her evening medications with encouragement, including an initial dose of Invega.  Staff will monitor for problematic side effects.  The patient was unable to attend evening group.  Adequate food and fluids consumed and the patient has been able to independently care for her basic needs.    Over night, the patient was frequently heard loudly talking to her self in her room.  Sleep was broken and of poor quality.

## 2022-09-02 NOTE — Plan of Care (Signed)
D- Patient alert and oriented. Pt is irritable and has been responding to internal stimuli. Denies SI, HI, AVH, and pain.While pt denies AVH it is clear she is responding. Pt has slammed her door and has been yelling some and swearing. This writer has redirected pt about swearing, pt does not accept redirection.  A- Scheduled medications administered to patient, per MD orders. Support and encouragement provided.  Routine safety checks conducted every 15 minutes.  Patient informed to notify staff with problems or concerns.   R- No adverse drug reactions noted. Patient contracts for safety at this time. Patient compliant with medications and treatment plan. Patient irritable and labile.. Patient has limited interaction with others on the unit.  Patient remains safe at this time.

## 2022-09-02 NOTE — Progress Notes (Signed)
Patient woke up twice started singing with loud voice required redirection and went back to sleep. She continues to appear to be responding to internal stimuli but when asked Patient declined. Support and encouragement provided. Q 15 minutes safety checks ongoing Patient remains safe.

## 2022-09-02 NOTE — Progress Notes (Signed)
[facility] MD Progress Note  09/02/2022 4:11 PM Betty Henderson  MRN:  [id number] Subjective: Follow-up for this 62 year old woman with schizophrenia or schizoaffective disorder.  Patient remains disorganized with very unclear thinking.  She does state that she still hears things at times.  Mentions that she is a heavy smoker.  Asks me about calling her son and can recite her son's phone number.  No evidence of any side effects from medicine or acute physical difficulties Principal Problem: Schizoaffective disorder, bipolar type (HCC) Diagnosis: Principal Problem:   Schizoaffective disorder, bipolar type (HCC)  Total Time spent with patient: 30 minutes  Past Psychiatric History: Past history of schizophrenia sounds like chronic for many years probably decades.  Some stability in the past when on long-acting injectables  Past Medical History:  Past Medical History:  Diagnosis Date   Anemia    Arthritis    Chronic pain    Drug-seeking behavior    Malingering    Osteopetrosis    Psychosis (HCC)    Schizoaffective disorder, bipolar type (HCC)     Past Surgical History:  Procedure Laterality Date   MOUTH SURGERY     TUBAL LIGATION     Family History:  Family History  Family history unknown: Yes   Family Psychiatric  History: See previous Social History:  Social History   Substance and Sexual Activity  Alcohol Use Not Currently   Comment: 1 cocktail 3 weeks ago     Social History   Substance and Sexual Activity  Drug Use Not Currently   Types: Cocaine   Comment: states "it's legal though"    Social History   Socioeconomic History   Marital status: Single    Spouse name: Not on file   Number of children: Not on file   Years of education: Not on file   Highest education level: Not on file  Occupational History   Not on file  Tobacco Use   Smoking status: Every Day    Packs/day: .5    Types: Cigarettes   Smokeless tobacco: Never  Vaping Use   Vaping Use: Never used   Substance and Sexual Activity   Alcohol use: Not Currently    Comment: 1 cocktail 3 weeks ago   Drug use: Not Currently    Types: Cocaine    Comment: states "it's legal though"   Sexual activity: Never  Other Topics Concern   Not on file  Social History Narrative   Not on file   Social Determinants of Health   Financial Resource Strain: Not on file  Food Insecurity: Food Insecurity Present (08/22/2022)   Hunger Vital Sign    Worried About Running Out of Food in the Last Year: Sometimes true    Ran Out of Food in the Last Year: Sometimes true  Transportation Needs: Patient Unable To Answer (08/22/2022)   PRAPARE - Administrator, Civil Service (Medical): Patient unable to answer    Lack of Transportation (Non-Medical): Patient unable to answer  Physical Activity: Not on file  Stress: Not on file  Social Connections: Not on file   Additional Social History:                         Sleep: Fair  Appetite:  Fair  Current Medications: Current Facility-Administered Medications  Medication Dose Route Frequency Provider Last Rate Last Admin   acetaminophen (TYLENOL) tablet 650 mg  650 mg Oral Q6H PRN [name], DO  650 mg at 08/24/22 0110   alum & mag hydroxide-simeth (MAALOX/MYLANTA) 200-200-20 MG/5ML suspension 30 mL  30 mL Oral Q4H PRN [name], DO       diphenhydrAMINE (BENADRYL) capsule 50 mg  50 mg Oral Q6H PRN [name], DO   50 mg at 08/31/22 2040   Or   diphenhydrAMINE (BENADRYL) injection 50 mg  50 mg Intramuscular Q6H PRN [name], DO       haloperidol (HALDOL) tablet 5 mg  5 mg Oral Q6H PRN [name], DO   5 mg at 08/31/22 2040   Or   haloperidol lactate (HALDOL) injection 5 mg  5 mg Intramuscular Q6H PRN [name], DO       LORazepam (ATIVAN) tablet 1 mg  1 mg Oral TID PRN [name], DO   1 mg at 08/30/22 0804   magnesium hydroxide (MILK OF  MAGNESIA) suspension 30 mL  30 mL Oral Daily PRN [name], DO       paliperidone (INVEGA) 24 hr tablet 6 mg  6 mg Oral QHS Brandon Scarbrough T, MD       traZODone (DESYREL) tablet 50 mg  50 mg Oral QHS PRN [name], DO   50 mg at 09/01/22 2117    Lab Results: No results found for this or any previous visit (from the past 48 hour(s)).  Blood Alcohol level:  Lab Results  Component Value Date   ETH <10 08/17/2022   ETH <10 01/11/2021    Metabolic Disorder Labs: No results found for: "HGBA1C", "MPG" No results found for: "PROLACTIN" No results found for: "CHOL", "TRIG", "HDL", "CHOLHDL", "VLDL", "LDLCALC"  Physical Findings: AIMS:  , ,  ,  ,    CIWA:    COWS:     Musculoskeletal: Strength & Muscle Tone: within normal limits Gait & Station: normal Patient leans: N/A  Psychiatric Specialty Exam:  Presentation  General Appearance:  Casual; Neat  Eye Contact: Fair  Speech: Slow; Slurred  Speech Volume: Decreased  Handedness: Right   Mood and Affect  Mood: Anxious  Affect: Inappropriate   Thought Process  Thought Processes: Disorganized  Descriptions of Associations:Loose  Orientation:Partial  Thought Content:Illogical; Rumination  History of Schizophrenia/Schizoaffective disorder:No data recorded Duration of Psychotic Symptoms:No data recorded Hallucinations:Hallucinations: None  Ideas of Reference:Paranoia  Suicidal Thoughts:Suicidal Thoughts: No  Homicidal Thoughts:Homicidal Thoughts: No   Sensorium  Memory: Immediate Fair; Remote Poor  Judgment: Poor  Insight: Poor   Executive Functions  Concentration: Poor  Attention Span: Fair  Recall: [employer] of Knowledge: Fair  Language: Fair   Psychomotor Activity  Psychomotor Activity: Psychomotor Activity: Normal   Assets  Assets: Communication Skills   Sleep  Sleep: Sleep: Fair    Physical Exam: Physical Exam Vitals and nursing  note reviewed.  Constitutional:      Appearance: Normal appearance.  HENT:     Head: Normocephalic and atraumatic.     Mouth/Throat:     Pharynx: Oropharynx is clear.  Eyes:     Pupils: Pupils are equal, round, and reactive to light.  Cardiovascular:     Rate and Rhythm: Normal rate and regular rhythm.  Pulmonary:     Effort: Pulmonary effort is normal.     Breath sounds: Normal breath sounds.  Abdominal:     General: Abdomen is flat.     Palpations: Abdomen is soft.  Musculoskeletal:        General: Normal range of  motion.  Skin:    General: Skin is warm and dry.  Neurological:     General: No focal deficit present.     Mental Status: She is alert. Mental status is at baseline.  Psychiatric:        Attention and Perception: She is inattentive.        Mood and Affect: Mood normal. Affect is blunt.        Speech: She is noncommunicative. Speech is tangential.        Behavior: Behavior is cooperative.        Cognition and Memory: Cognition is impaired.    Review of Systems  Constitutional: Negative.   HENT: Negative.    Eyes: Negative.   Respiratory: Negative.    Cardiovascular: Negative.   Gastrointestinal: Negative.   Musculoskeletal: Negative.   Skin: Negative.   Neurological: Negative.   Psychiatric/Behavioral:  Positive for hallucinations.    Blood pressure 108/71, pulse 84, temperature 98.4 F (36.9 C), resp. rate 15, height 5\' 6"  (1.676 m), weight 55.3 kg, SpO2 100 %. Body mass index is 19.69 kg/m.   Treatment Plan Summary: Medication management and Plan after reviewing the chart I suggest that she be switched over to a medicine that can be given as a long-acting injectable.  I am going to start her on Invega and discontinue the Thorazine and olanzapine.  Discharge planning still unclear.  If I understand the social work notes the family has been contacted and do not believe that she can stay with them.  Mordecai Rasmussen, MD 09/02/2022, 4:11 PM

## 2022-09-03 DIAGNOSIS — F250 Schizoaffective disorder, bipolar type: Secondary | ICD-10-CM | POA: Diagnosis not present

## 2022-09-03 MED ORDER — PALIPERIDONE ER 3 MG PO TB24
9.0000 mg | ORAL_TABLET | Freq: Every day | ORAL | Status: DC
  Administered 2022-09-03 – 2022-09-27 (×25): 9 mg via ORAL
  Filled 2022-09-03 (×25): qty 3

## 2022-09-03 NOTE — Group Note (Unsigned)
Date:  09/03/2022 Time:  8:46 PM  Group Topic/Focus:  Emotional Education:   The focus of this group is to discuss what feelings/emotions are, and how they are experienced.     Participation Level:  {BHH PARTICIPATION ZOXWR:60454}  Participation Quality:  {BHH PARTICIPATION QUALITY:22265}  Affect:  {BHH AFFECT:22266}  Cognitive:  {BHH COGNITIVE:22267}  Insight: {BHH Insight2:20797}  Engagement in Group:  {BHH ENGAGEMENT IN UJWJX:91478}  Modes of Intervention:  {BHH MODES OF INTERVENTION:22269}  Additional Comments:  ***  [name] 09/03/2022, 8:46 PM

## 2022-09-03 NOTE — Progress Notes (Signed)
[facility], [place] MD Progress Note  09/03/2022 2:41 PM Betty Henderson  MRN:  [id number] Subjective: Follow-up for this 62 year old woman with schizophrenia.  Patient seen and chart reviewed.  She was in bed resting in the afternoon but was awake and very pleasant in communication with me.  Did not report any specific new symptoms.  Thoughts remained quite disorganized.  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 Principal Problem: Schizoaffective disorder, bipolar type (HCC) Diagnosis: Principal Problem:   Schizoaffective disorder, bipolar type (HCC)  Total Time spent with patient: 30 minutes  Past Psychiatric History: Past history of schizophrenia  Past Medical History:  Past Medical History:  Diagnosis Date   Anemia    Arthritis    Chronic pain    Drug-seeking behavior    Malingering    Osteopetrosis    Psychosis (HCC)    Schizoaffective disorder, bipolar type (HCC)     Past Surgical History:  Procedure Laterality Date   MOUTH SURGERY     TUBAL LIGATION     Family History:  Family History  Family history unknown: Yes   Family Psychiatric  History: See previous Social History:  Social History   Substance and Sexual Activity  Alcohol Use Not Currently   Comment: 1 cocktail 3 weeks ago     Social History   Substance and Sexual Activity  Drug Use Not Currently   Types: Cocaine   Comment: states "it's legal though"    Social History   Socioeconomic History   Marital status: Single    Spouse name: Not on file   Number of children: Not on file   Years of education: Not on file   Highest education level: Not on file  Occupational History   Not on file  Tobacco Use   Smoking status: Every Day    Packs/day: .5    Types: Cigarettes   Smokeless tobacco: Never  Vaping Use   Vaping Use: Never used  Substance and Sexual Activity   Alcohol use: Not Currently     Comment: 1 cocktail 3 weeks ago   Drug use: Not Currently    Types: Cocaine    Comment: states "it's legal though"   Sexual activity: Never  Other Topics Concern   Not on file  Social History Narrative   Not on file   Social Determinants of Health   Financial Resource Strain: Not on file  Food Insecurity: Food Insecurity Present (08/22/2022)   Hunger Vital Sign    Worried About Running Out of Food in the Last Year: Sometimes true    Ran Out of Food in the Last Year: Sometimes true  Transportation Needs: Patient Unable To Answer (08/22/2022)   PRAPARE - Administrator, Civil Service (Medical): Patient unable to answer    Lack of Transportation (Non-Medical): Patient unable to answer  Physical Activity: Not on file  Stress: Not on file  Social Connections: Not on file   Additional Social History:                         Sleep: Good  Appetite:  Good  Current Medications: Current Facility-Administered Medications  Medication Dose Route Frequency Provider Last Rate Last Admin   acetaminophen (TYLENOL) tablet 650 mg  650 mg Oral Q6H PRN [name], DO   650 mg at 08/24/22 0110  alum & mag hydroxide-simeth (MAALOX/MYLANTA) 200-200-20 MG/5ML suspension 30 mL  30 mL Oral Q4H PRN [name], DO       diphenhydrAMINE (BENADRYL) capsule 50 mg  50 mg Oral Q6H PRN [name], DO   50 mg at 08/31/22 2040   Or   diphenhydrAMINE (BENADRYL) injection 50 mg  50 mg Intramuscular Q6H PRN [name], DO       haloperidol (HALDOL) tablet 5 mg  5 mg Oral Q6H PRN [name], DO   5 mg at 08/31/22 2040   Or   haloperidol lactate (HALDOL) injection 5 mg  5 mg Intramuscular Q6H PRN [name], DO       LORazepam (ATIVAN) tablet 1 mg  1 mg Oral TID PRN [name], DO   1 mg at 08/30/22 0804   magnesium hydroxide (MILK OF MAGNESIA) suspension 30 mL  30 mL Oral Daily PRN [name], DO       paliperidone (INVEGA) 24 hr tablet 9 mg  9 mg Oral QHS Torin Whisner T, MD       traZODone (DESYREL) tablet 50 mg  50 mg Oral QHS PRN [name], DO   50 mg at 09/02/22 2213    Lab Results: No results found for this or any previous visit (from the past 48 hour(s)).  Blood Alcohol level:  Lab Results  Component Value Date   ETH <10 08/17/2022   ETH <10 01/11/2021    Metabolic Disorder Labs: No results found for: "HGBA1C", "MPG" No results found for: "PROLACTIN" No results found for: "CHOL", "TRIG", "HDL", "CHOLHDL", "VLDL", "LDLCALC"  Physical Findings: AIMS:  , ,  ,  ,    CIWA:    COWS:     Musculoskeletal: Strength & Muscle Tone: within normal limits Gait & Station: normal Patient leans: N/A  Psychiatric Specialty Exam:  Presentation  General Appearance:  Casual; Neat  Eye Contact: Fair  Speech: Slow; Slurred  Speech Volume: Decreased  Handedness: Right   Mood and Affect  Mood: Anxious  Affect: Inappropriate   Thought Process  Thought Processes: Disorganized  Descriptions of Associations:Loose  Orientation:Partial  Thought Content:Illogical; Rumination  History of Schizophrenia/Schizoaffective disorder:No data recorded Duration of Psychotic Symptoms:No data recorded Hallucinations:No data recorded Ideas of Reference:Paranoia  Suicidal Thoughts:No data recorded Homicidal Thoughts:No data recorded  Sensorium  Memory: Immediate Fair; Remote Poor  Judgment: Poor  Insight: Poor   Executive Functions  Concentration: Poor  Attention Span: Fair  Recall: [employer] of Knowledge: Fair  Language: Fair   Psychomotor Activity  Psychomotor Activity:No data recorded  Assets  Assets: Communication Skills   Sleep  Sleep:No data recorded   Physical Exam: Physical Exam Constitutional:      Appearance: Normal appearance.  HENT:     Head: Normocephalic and atraumatic.     Mouth/Throat:      Pharynx: Oropharynx is clear.  Eyes:     Pupils: Pupils are equal, round, and reactive to light.  Cardiovascular:     Rate and Rhythm: Normal rate and regular rhythm.  Pulmonary:     Effort: Pulmonary effort is normal.     Breath sounds: Normal breath sounds.  Abdominal:     General: Abdomen is flat.     Palpations: Abdomen is soft.  Musculoskeletal:        General: Normal range of motion.  Skin:    General: Skin is warm and dry.  Neurological:  General: No focal deficit present.     Mental Status: She is alert. Mental status is at baseline.  Psychiatric:        Attention and Perception: Attention normal.        Mood and Affect: Mood normal. Affect is blunt.        Speech: Speech is tangential.        Behavior: Behavior is cooperative.        Thought Content: Thought content normal.        Cognition and Memory: Cognition is impaired.    Review of Systems  Constitutional: Negative.   HENT: Negative.    Eyes: Negative.   Respiratory: Negative.    Cardiovascular: Negative.   Gastrointestinal: Negative.   Musculoskeletal: Negative.   Skin: Negative.   Neurological: Negative.   Psychiatric/Behavioral: Negative.     Blood pressure 106/60, pulse 72, temperature 97.9 F (36.6 C), resp. rate 17, height 5\' 6"  (1.676 m), weight 55.3 kg, SpO2 100 %. Body mass index is 19.69 kg/m.   Treatment Plan Summary: Medication management and Plan yesterday I had switched her over to [facility] in hopes of going to a long-acting injectable.  She tolerated the 6 mg fine.  Increased dose tonight to 9 mg if she is doing fine with that we will go ahead with the injection.  Still unclear what her disposition will be.  Mordecai Rasmussen, MD 09/03/2022, 2:41 PM

## 2022-09-03 NOTE — Progress Notes (Signed)
   09/03/22 0900  Psych Admission Type (Psych Patients Only)  Admission Status Voluntary  Psychosocial Assessment  Patient Complaints Irritability  Eye Contact Avoids  Facial Expression Flat  Affect Irritable  Speech Loud  Interaction Poor  Motor Activity Pacing  Appearance/Hygiene Unremarkable  Behavior Characteristics Irritable  Mood Irritable  Aggressive Behavior  Type of Behavior Verbal  Effect No apparent injury  Thought Process  Coherency Disorganized  Content UTA  Delusions None reported or observed  Perception Hallucinations  Hallucination Auditory;Visual  Judgment Poor  Confusion Mild  Danger to Self  Current suicidal ideation? Denies  Danger to Others  Danger to Others None reported or observed   Patient continues to respond to internal stimuli. Participated in groups today. Tolerated all meal today. Verbally redirectable. Denies any pain or discomfort. Denies HI or SI.

## 2022-09-03 NOTE — [facility] Group Notes (Signed)
[facility] Group Notes:  (Nursing/MHT/Case Management/Adjunct)  Date:  09/03/2022  Time:  8:32 AM  Type of Therapy:  Music Therapy  Participation Level:  Did Not Attend    Betty Henderson 09/03/2022, 8:32 AM

## 2022-09-03 NOTE — [facility] Group Notes (Addendum)
[facility] Group Notes:  (Nursing/MHT/Case Management/Adjunct)  Date:  09/03/2022  Time:  10:52 AM  Type of Therapy:  Outside Recreation   Participation Level:  Active  Participation Quality:  Appropriate  Affect:  Appropriate and Excited  Cognitive:  Disorganized  Insight:  Appropriate  Engagement in Group:  Improving  Modes of Intervention:  Activity  Summary of Progress/Problems:09:25-10:30  Betty Henderson Lurlean Nanny 09/03/2022, 10:52 AM

## 2022-09-03 NOTE — Progress Notes (Signed)
Patient's sleep continues to be significantly disturbed.  Moments of emotional lability, anger, and verbal outbursts spread throughout the night and into the morning.  Redirection and music helpful.  The patient slept roughly 2 hours during the evening/night shift.

## 2022-09-04 DIAGNOSIS — F250 Schizoaffective disorder, bipolar type: Secondary | ICD-10-CM | POA: Diagnosis not present

## 2022-09-04 MED ORDER — PALIPERIDONE PALMITATE ER 234 MG/1.5ML IM SUSY
234.0000 mg | PREFILLED_SYRINGE | Freq: Once | INTRAMUSCULAR | Status: AC
  Administered 2022-09-04: 234 mg via INTRAMUSCULAR
  Filled 2022-09-04: qty 1.5

## 2022-09-04 MED ORDER — IBUPROFEN 200 MG PO TABS
600.0000 mg | ORAL_TABLET | Freq: Four times a day (QID) | ORAL | Status: DC | PRN
  Administered 2022-09-12 – 2023-03-24 (×40): 600 mg via ORAL
  Filled 2022-09-04 (×45): qty 3

## 2022-09-04 NOTE — Progress Notes (Signed)
   09/04/22 1500  Psych Admission Type (Psych Patients Only)  Admission Status Voluntary  Psychosocial Assessment  Patient Complaints Agitation;Irritability;Anger  Eye Contact Poor  Facial Expression Flat  Affect Angry;Irritable  Speech Loud  Interaction Poor  Motor Activity Slow  Appearance/Hygiene In scrubs  Behavior Characteristics Agressive verbally;Agitated;Irritable  Mood Angry;Irritable  Aggressive Behavior  Type of Behavior Verbal  Thought Process  Coherency Disorganized  Content Delusions  Delusions Grandeur  Perception Hallucinations  Hallucination Auditory;Visual  Judgment Impaired  Confusion Mild  Danger to Self  Current suicidal ideation? Denies  Danger to Others  Danger to Others None reported or observed   Patient mostly agitated and verbally attacking peer today with multiple de-escalation. Then medicated with ativan for agitation as ordered. Attended group, watched television in the dayroom. Minimal interaction with staff.

## 2022-09-04 NOTE — Progress Notes (Signed)
PRNs given for anxiety, agitation. See MAR.   09/03/22 2200  Psych Admission Type (Psych Patients Only)  Admission Status Voluntary  Psychosocial Assessment  Patient Complaints Agitation;Anxiety;Irritability  Eye Contact Brief  Facial Expression Flat  Affect Irritable  Speech Loud  Interaction Minimal  Motor Activity Slow  Appearance/Hygiene Unremarkable  Behavior Characteristics Impulsive  Mood Irritable  Aggressive Behavior  Effect No apparent injury  Thought Process  Coherency Disorganized  Content Preoccupation  Delusions None reported or observed  Perception Hallucinations  Hallucination Auditory  Judgment Impaired  Confusion Mild  Danger to Self  Current suicidal ideation? Denies  Danger to Others  Danger to Others None reported or observed

## 2022-09-04 NOTE — [facility] Group Notes (Signed)
[facility] Group Notes:  (Nursing/MHT/Case Management/Adjunct)  Date:  09/04/2022  Time:  10:44 AM  Type of Therapy: Outside Recreation   Participation Level:  Active  Participation Quality:  Appropriate  Affect:  Appropriate  Cognitive:  Disorganized and Oriented  Insight:  Improving  Engagement in Group:  Improving  Modes of Intervention:  Activity  Summary of Progress/Problems:  Betty Henderson 09/04/2022, 10:44 AM

## 2022-09-04 NOTE — Progress Notes (Signed)
[facility] MD Progress Note  09/04/2022 5:56 PM Betty Henderson  MRN:  [id number] Subjective: Follow-up 62 year old woman with schizophrenia or schizoaffective disorder.  Patient this afternoon was having a telephone conversation and got extremely angry cursing and shouting on the unit.  Shortly afterwards I saw her in her room and she had calmed down again.  Continues to be pretty disorganized in her thinking and speech.  Did tell me that her hip was hurting and ask for pain medicine. Principal Problem: Schizoaffective disorder, bipolar type (HCC) Diagnosis: Principal Problem:   Schizoaffective disorder, bipolar type (HCC)  Total Time spent with patient: 30 minutes  Past Psychiatric History: Past history of schizophrenia previously on long-acting injectable  Past Medical History:  Past Medical History:  Diagnosis Date   Anemia    Arthritis    Chronic pain    Drug-seeking behavior    Malingering    Osteopetrosis    Psychosis (HCC)    Schizoaffective disorder, bipolar type (HCC)     Past Surgical History:  Procedure Laterality Date   MOUTH SURGERY     TUBAL LIGATION     Family History:  Family History  Family history unknown: Yes   Family Psychiatric  History: See previous Social History:  Social History   Substance and Sexual Activity  Alcohol Use Not Currently   Comment: 1 cocktail 3 weeks ago     Social History   Substance and Sexual Activity  Drug Use Not Currently   Types: Cocaine   Comment: states "it's legal though"    Social History   Socioeconomic History   Marital status: Single    Spouse name: Not on file   Number of children: Not on file   Years of education: Not on file   Highest education level: Not on file  Occupational History   Not on file  Tobacco Use   Smoking status: Every Day    Packs/day: .5    Types: Cigarettes   Smokeless tobacco: Never  Vaping Use   Vaping Use: Never used  Substance and Sexual Activity   Alcohol use: Not Currently     Comment: 1 cocktail 3 weeks ago   Drug use: Not Currently    Types: Cocaine    Comment: states "it's legal though"   Sexual activity: Never  Other Topics Concern   Not on file  Social History Narrative   Not on file   Social Determinants of Health   Financial Resource Strain: Not on file  Food Insecurity: Food Insecurity Present (08/22/2022)   Hunger Vital Sign    Worried About Running Out of Food in the Last Year: Sometimes true    Ran Out of Food in the Last Year: Sometimes true  Transportation Needs: Patient Unable To Answer (08/22/2022)   PRAPARE - Administrator, Civil Service (Medical): Patient unable to answer    Lack of Transportation (Non-Medical): Patient unable to answer  Physical Activity: Not on file  Stress: Not on file  Social Connections: Not on file   Additional Social History:                         Sleep: Fair  Appetite:  Fair  Current Medications: Current Facility-Administered Medications  Medication Dose Route Frequency Provider Last Rate Last Admin   acetaminophen (TYLENOL) tablet 650 mg  650 mg Oral Q6H PRN [name], DO   650 mg at 08/24/22 0110   alum & mag  hydroxide-simeth (MAALOX/MYLANTA) 200-200-20 MG/5ML suspension 30 mL  30 mL Oral Q4H PRN [name], DO       diphenhydrAMINE (BENADRYL) capsule 50 mg  50 mg Oral Q6H PRN [name], DO   50 mg at 09/03/22 2054   Or   diphenhydrAMINE (BENADRYL) injection 50 mg  50 mg Intramuscular Q6H PRN [name], DO       haloperidol (HALDOL) tablet 5 mg  5 mg Oral Q6H PRN [name], DO   5 mg at 09/03/22 2054   Or   haloperidol lactate (HALDOL) injection 5 mg  5 mg Intramuscular Q6H PRN [name], DO       ibuprofen (ADVIL) tablet 600 mg  600 mg Oral Q6H PRN Findlay Dagher, [name], MD       LORazepam (ATIVAN) tablet 1 mg  1 mg Oral TID PRN [name], DO   1 mg at 09/04/22 1627   magnesium  hydroxide (MILK OF MAGNESIA) suspension 30 mL  30 mL Oral Daily PRN [name], DO       paliperidone (INVEGA SUSTENNA) injection 234 mg  234 mg Intramuscular Once Inell Mimbs, [name], MD       paliperidone (INVEGA) 24 hr tablet 9 mg  9 mg Oral QHS Baruc Tugwell T, MD   9 mg at 09/03/22 2054   traZODone (DESYREL) tablet 50 mg  50 mg Oral QHS PRN [name], DO   50 mg at 09/03/22 2054    Lab Results: No results found for this or any previous visit (from the past 48 hour(s)).  Blood Alcohol level:  Lab Results  Component Value Date   ETH <10 08/17/2022   ETH <10 01/11/2021    Metabolic Disorder Labs: No results found for: "HGBA1C", "MPG" No results found for: "PROLACTIN" No results found for: "CHOL", "TRIG", "HDL", "CHOLHDL", "VLDL", "LDLCALC"  Physical Findings: AIMS:  , ,  ,  ,    CIWA:    COWS:     Musculoskeletal: Strength & Muscle Tone: within normal limits Gait & Station: normal Patient leans: N/A  Psychiatric Specialty Exam:  Presentation  General Appearance:  Casual; Neat  Eye Contact: Fair  Speech: Slow; Slurred  Speech Volume: Decreased  Handedness: Right   Mood and Affect  Mood: Anxious  Affect: Inappropriate   Thought Process  Thought Processes: Disorganized  Descriptions of Associations:Loose  Orientation:Partial  Thought Content:Illogical; Rumination  History of Schizophrenia/Schizoaffective disorder:No data recorded Duration of Psychotic Symptoms:No data recorded Hallucinations:No data recorded Ideas of Reference:Paranoia  Suicidal Thoughts:No data recorded Homicidal Thoughts:No data recorded  Sensorium  Memory: Immediate Fair; Remote Poor  Judgment: Poor  Insight: Poor   Executive Functions  Concentration: Poor  Attention Span: Fair  Recall: [employer] of Knowledge: Fair  Language: Fair   Psychomotor Activity  Psychomotor Activity:No data recorded  Assets   Assets: Communication Skills   Sleep  Sleep:No data recorded   Physical Exam: Physical Exam Vitals and nursing note reviewed.  Constitutional:      Appearance: Normal appearance.  HENT:     Head: Normocephalic and atraumatic.     Mouth/Throat:     Pharynx: Oropharynx is clear.  Eyes:     Pupils: Pupils are equal, round, and reactive to light.  Cardiovascular:     Rate and Rhythm: Normal rate and regular rhythm.  Pulmonary:     Effort: Pulmonary effort is normal.     Breath sounds: Normal breath sounds.  Abdominal:     General: Abdomen is flat.     Palpations: Abdomen is soft.  Musculoskeletal:        General: Normal range of motion.  Skin:    General: Skin is warm and dry.  Neurological:     General: No focal deficit present.     Mental Status: She is alert. Mental status is at baseline.  Psychiatric:        Attention and Perception: She is inattentive.        Mood and Affect: Mood normal. Affect is labile and angry.        Speech: Speech is tangential.        Behavior: Behavior is agitated.        Thought Content: Thought content normal.    Review of Systems  Constitutional: Negative.   HENT: Negative.    Eyes: Negative.   Respiratory: Negative.    Cardiovascular: Negative.   Gastrointestinal: Negative.   Musculoskeletal:  Positive for joint pain.  Skin: Negative.   Neurological: Negative.   Psychiatric/Behavioral: Negative.     Blood pressure 104/63, pulse 95, temperature 98.5 F (36.9 C), resp. rate 15, height 5\' 6"  (1.676 m), weight 55.3 kg, SpO2 100 %. Body mass index is 19.69 kg/m.   Treatment Plan Summary: Plan add an order for Motrin for pain.  Also informed the patient I would like to start transitioning her over to her long-acting injectable.  Order placed for first dose of injectable Invega tonight.  Mordecai Rasmussen, MD 09/04/2022, 5:56 PM

## 2022-09-05 DIAGNOSIS — F250 Schizoaffective disorder, bipolar type: Secondary | ICD-10-CM | POA: Diagnosis not present

## 2022-09-05 NOTE — Progress Notes (Signed)
[facility] MD Progress Note  09/05/2022 1:50 PM Betty Henderson  MRN:  [id number] Subjective: Follow-up for this 62 year old woman with chronic psychotic disorder.  Patient seen and chart reviewed.  She was given the long-acting Invega injection yesterday and has tolerated that fine.  Patient was in her room relaxing when I found her but was cooperative with conversation.  Very difficult to interview.  Speech is slurred rapid and disorganized.  She seemed at 1 point to be telling me that she owned multiple properties in the state.  Little of it seemed to make sense.  She seemed confident that she could be discharged anytime and have a place to go but could not really explain what that was.  Still has some signs of mood lability at times.  Physically appears stable vital stable.  Eating well. Principal Problem: Schizoaffective disorder, bipolar type (HCC) Diagnosis: Principal Problem:   Schizoaffective disorder, bipolar type (HCC)  Total Time spent with patient: 30 minutes  Past Psychiatric History: Long history of chronic psychotic disorder diagnosed schizoaffective disorder previously maintained on long-acting Invega  Past Medical History:  Past Medical History:  Diagnosis Date   Anemia    Arthritis    Chronic pain    Drug-seeking behavior    Malingering    Osteopetrosis    Psychosis (HCC)    Schizoaffective disorder, bipolar type (HCC)     Past Surgical History:  Procedure Laterality Date   MOUTH SURGERY     TUBAL LIGATION     Family History:  Family History  Family history unknown: Yes   Family Psychiatric  History: See previous Social History:  Social History   Substance and Sexual Activity  Alcohol Use Not Currently   Comment: 1 cocktail 3 weeks ago     Social History   Substance and Sexual Activity  Drug Use Not Currently   Types: Cocaine   Comment: states "it's legal though"    Social History   Socioeconomic History   Marital status: Single    Spouse name: Not  on file   Number of children: Not on file   Years of education: Not on file   Highest education level: Not on file  Occupational History   Not on file  Tobacco Use   Smoking status: Every Day    Packs/day: .5    Types: Cigarettes   Smokeless tobacco: Never  Vaping Use   Vaping Use: Never used  Substance and Sexual Activity   Alcohol use: Not Currently    Comment: 1 cocktail 3 weeks ago   Drug use: Not Currently    Types: Cocaine    Comment: states "it's legal though"   Sexual activity: Never  Other Topics Concern   Not on file  Social History Narrative   Not on file   Social Determinants of Health   Financial Resource Strain: Not on file  Food Insecurity: Food Insecurity Present (08/22/2022)   Hunger Vital Sign    Worried About Running Out of Food in the Last Year: Sometimes true    Ran Out of Food in the Last Year: Sometimes true  Transportation Needs: Patient Unable To Answer (08/22/2022)   PRAPARE - Administrator, Civil Service (Medical): Patient unable to answer    Lack of Transportation (Non-Medical): Patient unable to answer  Physical Activity: Not on file  Stress: Not on file  Social Connections: Not on file   Additional Social History:  Sleep: Fair  Appetite:  Fair  Current Medications: Current Facility-Administered Medications  Medication Dose Route Frequency Provider Last Rate Last Admin   acetaminophen (TYLENOL) tablet 650 mg  650 mg Oral Q6H PRN [name], DO   650 mg at 08/24/22 0110   alum & mag hydroxide-simeth (MAALOX/MYLANTA) 200-200-20 MG/5ML suspension 30 mL  30 mL Oral Q4H PRN [name], DO       diphenhydrAMINE (BENADRYL) capsule 50 mg  50 mg Oral Q6H PRN [name], DO   50 mg at 09/05/22 1610   Or   diphenhydrAMINE (BENADRYL) injection 50 mg  50 mg Intramuscular Q6H PRN [name], DO       haloperidol (HALDOL) tablet 5 mg  5 mg Oral Q6H PRN  [name], DO   5 mg at 09/05/22 9604   Or   haloperidol lactate (HALDOL) injection 5 mg  5 mg Intramuscular Q6H PRN [name], DO       ibuprofen (ADVIL) tablet 600 mg  600 mg Oral Q6H PRN Lenya Sterne, [name], MD       LORazepam (ATIVAN) tablet 1 mg  1 mg Oral TID PRN [name], DO   1 mg at 09/04/22 1627   magnesium hydroxide (MILK OF MAGNESIA) suspension 30 mL  30 mL Oral Daily PRN [name], DO       paliperidone (INVEGA) 24 hr tablet 9 mg  9 mg Oral QHS Elizabethanne Lusher T, MD   9 mg at 09/04/22 2126   traZODone (DESYREL) tablet 50 mg  50 mg Oral QHS PRN [name], DO   50 mg at 09/04/22 2126    Lab Results: No results found for this or any previous visit (from the past 48 hour(s)).  Blood Alcohol level:  Lab Results  Component Value Date   ETH <10 08/17/2022   ETH <10 01/11/2021    Metabolic Disorder Labs: No results found for: "HGBA1C", "MPG" No results found for: "PROLACTIN" No results found for: "CHOL", "TRIG", "HDL", "CHOLHDL", "VLDL", "LDLCALC"  Physical Findings: AIMS:  , ,  ,  ,    CIWA:    COWS:     Musculoskeletal: Strength & Muscle Tone: within normal limits Gait & Station: normal Patient leans: N/A  Psychiatric Specialty Exam:  Presentation  General Appearance:  Casual; Neat  Eye Contact: Fair  Speech: Slow; Slurred  Speech Volume: Decreased  Handedness: Right   Mood and Affect  Mood: Anxious  Affect: Inappropriate   Thought Process  Thought Processes: Disorganized  Descriptions of Associations:Loose  Orientation:Partial  Thought Content:Illogical; Rumination  History of Schizophrenia/Schizoaffective disorder:No data recorded Duration of Psychotic Symptoms:No data recorded Hallucinations:No data recorded Ideas of Reference:Paranoia  Suicidal Thoughts:No data recorded Homicidal Thoughts:No data recorded  Sensorium  Memory: Immediate Fair; Remote  Poor  Judgment: Poor  Insight: Poor   Executive Functions  Concentration: Poor  Attention Span: Fair  Recall: [employer] of Knowledge: Fair  Language: Fair   Psychomotor Activity  Psychomotor Activity:No data recorded  Assets  Assets: Communication Skills   Sleep  Sleep:No data recorded   Physical Exam: Physical Exam Vitals and nursing note reviewed.  Constitutional:      Appearance: Normal appearance.  HENT:     Head: Normocephalic and atraumatic.     Mouth/Throat:     Pharynx: Oropharynx is clear.  Eyes:     Pupils: Pupils are equal, round, and reactive to light.  Cardiovascular:  Rate and Rhythm: Normal rate and regular rhythm.  Pulmonary:     Effort: Pulmonary effort is normal.     Breath sounds: Normal breath sounds.  Abdominal:     General: Abdomen is flat.     Palpations: Abdomen is soft.  Musculoskeletal:        General: Normal range of motion.  Skin:    General: Skin is warm and dry.  Neurological:     General: No focal deficit present.     Mental Status: She is alert. Mental status is at baseline.  Psychiatric:        Attention and Perception: She is inattentive.        Mood and Affect: Mood normal. Affect is inappropriate.        Speech: She is noncommunicative. Speech is rapid and pressured and tangential.        Behavior: Behavior is agitated. Behavior is not aggressive.        Thought Content: Thought content is delusional.    Review of Systems  Constitutional: Negative.   HENT: Negative.    Eyes: Negative.   Respiratory: Negative.    Cardiovascular: Negative.   Gastrointestinal: Negative.   Musculoskeletal: Negative.   Skin: Negative.   Neurological: Negative.   Psychiatric/Behavioral:  The patient is nervous/anxious.    Blood pressure 114/69, pulse 84, temperature 98.9 F (37.2 C), temperature source Oral, resp. rate 18, height 5\' 6"  (1.676 m), weight 55.3 kg, SpO2 99 %. Body mass index is 19.69  kg/m.   Treatment Plan Summary: Overall fairly stable maybe a little better than a few days ago and at least we are able to get the long-acting injection in her.  No sign of akathisia or other side effects.  Continue oral medicines.  Physically appears to be stable.  Spoke with full treatment team today.  Patient does not at this point appear to have capacity to make any reasonable decisions or understand her situation.  Suggest we work with the whole protective system about arrangements as currently he has no safe place to go     Mordecai Rasmussen, MD 09/05/2022, 1:50 PM

## 2022-09-05 NOTE — Group Note (Signed)
[facility] LCSW Group Therapy Note   Group Date: 09/05/2022 Start Time: 1300 End Time: 1400   Type of Therapy/Topic:  Group Therapy:  Balance in Life  Participation Level:  None   Description of Group:    This group will address the concept of balance and how it feels and looks when one is unbalanced. Patients will be encouraged to process areas in their lives that are out of balance, and identify reasons for remaining unbalanced. Facilitators will guide patients utilizing problem- solving interventions to address and correct the stressor making their life unbalanced. Understanding and applying boundaries will be explored and addressed for obtaining  and maintaining a balanced life. Patients will be encouraged to explore ways to assertively make their unbalanced needs known to significant others in their lives, using other group members and facilitator for support and feedback.  Therapeutic Goals: Patient will identify two or more emotions or situations they have that consume much of in their lives. Patient will identify signs/triggers that life has become out of balance:  Patient will identify two ways to set boundaries in order to achieve balance in their lives:  Patient will demonstrate ability to communicate their needs through discussion and/or role plays  Summary of Patient Progress: Patient was present at beginning of group, however, left and did not come back until the end.  Patient was observed to be responding to internal stimuli during group.   Therapeutic Modalities:   Cognitive Behavioral Therapy Solution-Focused Therapy Assertiveness Training   Harden Mo, LCSW

## 2022-09-05 NOTE — Group Note (Deleted)
[facility] LCSW Group Therapy Note   Group Date: 09/05/2022 Start Time: 1300 End Time: 1400   Type of Therapy/Topic:  Group Therapy:  Balance in Life  Participation Level:  {[facility] PARTICIPATION [id number]}   Description of Group:    This group will address the concept of balance and how it feels and looks when one is unbalanced. Patients will be encouraged to process areas in their lives that are out of balance, and identify reasons for remaining unbalanced. Facilitators will guide patients utilizing problem- solving interventions to address and correct the stressor making their life unbalanced. Understanding and applying boundaries will be explored and addressed for obtaining  and maintaining a balanced life. Patients will be encouraged to explore ways to assertively make their unbalanced needs known to significant others in their lives, using other group members and facilitator for support and feedback.  Therapeutic Goals: 1. Patient will identify two or more emotions or situations they have that consume much of in their lives. 2. Patient will identify signs/triggers that life has become out of balance:  3. Patient will identify two ways to set boundaries in order to achieve balance in their lives:  4. Patient will demonstrate ability to communicate their needs through discussion and/or role plays  Summary of Patient Progress:    ***    Therapeutic Modalities:   Cognitive Behavioral Therapy Solution-Focused Therapy Assertiveness Training   Harden Mo, LCSW

## 2022-09-05 NOTE — Progress Notes (Signed)
   09/05/22 2200  Psych Admission Type (Psych Patients Only)  Admission Status Voluntary  Psychosocial Assessment  Patient Complaints Irritability  Eye Contact Brief  Facial Expression Flat  Affect Irritable  Speech Loud  Interaction Minimal  Motor Activity Slow  Appearance/Hygiene In scrubs  Behavior Characteristics Irritable;Impulsive  Mood Labile  Thought Process  Coherency Disorganized  Content Delusions  Delusions Paranoid  Perception Hallucinations  Hallucination Auditory;Visual  Judgment Impaired  Confusion Mild  Danger to Self  Current suicidal ideation? Denies  Danger to Others  Danger to Others None reported or observed

## 2022-09-05 NOTE — Progress Notes (Signed)
Patient is alert and oriented. Affect angry. Mood labile. She responds to internal stimuli but no unsafe behaviors noted. She denies SI/HI/AVH. She denies anxiety and depression.  No meds scheduled on this shift. No PRN's given.  Q15 minute unit checks in place.

## 2022-09-05 NOTE — Group Note (Signed)
Date:  09/05/2022 Time:  4:10 PM  Group Topic/Focus:  Developing a Wellness Toolbox:   The focus of this group is to help patients develop a "wellness toolbox" with skills and strategies to promote recovery upon discharge.    Participation Level:  Active  Participation Quality:  Appropriate  Affect:  Appropriate  Cognitive:  Alert  Insight: None  Engagement in Group:  Off Topic  Modes of Intervention:  Activity  Additional Comments:    Alexis Frock 09/05/2022, 4:10 PM

## 2022-09-05 NOTE — Plan of Care (Signed)
  Problem: Pain Managment: Goal: General experience of comfort will improve Outcome: Progressing   Problem: Safety: Goal: Ability to remain free from injury will improve Outcome: Progressing   Problem: Skin Integrity: Goal: Risk for impaired skin integrity will decrease Outcome: Progressing   

## 2022-09-05 NOTE — Progress Notes (Signed)
Labile, agitated, verbally aggressive. States she ready to discharge. Re-directed appropriately. Encouraged to speak to MD about desires. PRNs given, see [facility]   09/04/22 2300  Psych Admission Type (Psych Patients Only)  Admission Status Voluntary  Psychosocial Assessment  Patient Complaints Agitation;Anxiety;Irritability  Eye Contact Brief  Facial Expression Flat  Affect Irritable  Speech Loud  Interaction Minimal  Motor Activity Slow  Appearance/Hygiene Unremarkable  Behavior Characteristics Impulsive  Mood Irritable  Aggressive Behavior  Effect No apparent injury  Thought Process  Coherency Disorganized  Content Delusions;Preoccupation  Delusions None reported or observed  Perception Hallucinations  Hallucination Auditory  Judgment Impaired  Confusion Mild  Danger to Self  Current suicidal ideation? Denies  Danger to Others  Danger to Others None reported or observed

## 2022-09-06 DIAGNOSIS — F250 Schizoaffective disorder, bipolar type: Secondary | ICD-10-CM | POA: Diagnosis not present

## 2022-09-06 LAB — CHLAMYDIA/NGC RT PCR (ARMC ONLY)
Chlamydia Tr: NOT DETECTED
N gonorrhoeae: NOT DETECTED

## 2022-09-06 LAB — WET PREP, GENITAL
Clue Cells Wet Prep HPF POC: NONE SEEN
Sperm: NONE SEEN
Trich, Wet Prep: NONE SEEN
WBC, Wet Prep HPF POC: >=10 — AB (ref <10)
Yeast Wet Prep HPF POC: NONE SEEN

## 2022-09-06 NOTE — Progress Notes (Signed)
   09/06/22 2300  Psych Admission Type (Psych Patients Only)  Admission Status Voluntary  Psychosocial Assessment  Patient Complaints Irritability  Eye Contact Brief  Facial Expression Flat  Affect Irritable  Speech Loud  Interaction Minimal  Motor Activity Slow  Appearance/Hygiene In scrubs  Behavior Characteristics Irritable  Mood Labile  Thought Process  Coherency Disorganized  Content Delusions  Delusions Paranoid  Perception Hallucinations  Hallucination Auditory;Visual  Judgment Impaired  Confusion Mild  Danger to Self  Current suicidal ideation? Denies  Danger to Others  Danger to Others None reported or observed

## 2022-09-06 NOTE — Progress Notes (Signed)
[facility] MD Progress Note  09/06/2022 5:33 PM Betty Henderson  MRN:  [id number] Subjective: Patient is complaining of having discharge vaginally.  Mood is described as okay.  No other physical complaints.  Behavior has been pretty calm today Principal Problem: Schizoaffective disorder, bipolar type (HCC) Diagnosis: Principal Problem:   Schizoaffective disorder, bipolar type (HCC)  Total Time spent with patient: 20 minutes  Past Psychiatric History: Schizophrenia  Past Medical History:  Past Medical History:  Diagnosis Date   Anemia    Arthritis    Chronic pain    Drug-seeking behavior    Malingering    Osteopetrosis    Psychosis (HCC)    Schizoaffective disorder, bipolar type (HCC)     Past Surgical History:  Procedure Laterality Date   MOUTH SURGERY     TUBAL LIGATION     Family History:  Family History  Family history unknown: Yes   Family Psychiatric  History: See above Social History:  Social History   Substance and Sexual Activity  Alcohol Use Not Currently   Comment: 1 cocktail 3 weeks ago     Social History   Substance and Sexual Activity  Drug Use Not Currently   Types: Cocaine   Comment: states "it's legal though"    Social History   Socioeconomic History   Marital status: Single    Spouse name: Not on file   Number of children: Not on file   Years of education: Not on file   Highest education level: Not on file  Occupational History   Not on file  Tobacco Use   Smoking status: Every Day    Packs/day: .5    Types: Cigarettes   Smokeless tobacco: Never  Vaping Use   Vaping Use: Never used  Substance and Sexual Activity   Alcohol use: Not Currently    Comment: 1 cocktail 3 weeks ago   Drug use: Not Currently    Types: Cocaine    Comment: states "it's legal though"   Sexual activity: Never  Other Topics Concern   Not on file  Social History Narrative   Not on file   Social Determinants of Health   Financial Resource Strain: Not on  file  Food Insecurity: Food Insecurity Present (08/22/2022)   Hunger Vital Sign    Worried About Running Out of Food in the Last Year: Sometimes true    Ran Out of Food in the Last Year: Sometimes true  Transportation Needs: Patient Unable To Answer (08/22/2022)   PRAPARE - Administrator, Civil Service (Medical): Patient unable to answer    Lack of Transportation (Non-Medical): Patient unable to answer  Physical Activity: Not on file  Stress: Not on file  Social Connections: Not on file   Additional Social History:                         Sleep: Fair  Appetite:  Fair  Current Medications: Current Facility-Administered Medications  Medication Dose Route Frequency Provider Last Rate Last Admin   acetaminophen (TYLENOL) tablet 650 mg  650 mg Oral Q6H PRN [name], DO   650 mg at 08/24/22 0110   alum & mag hydroxide-simeth (MAALOX/MYLANTA) 200-200-20 MG/5ML suspension 30 mL  30 mL Oral Q4H PRN [name], DO       diphenhydrAMINE (BENADRYL) capsule 50 mg  50 mg Oral Q6H PRN [name], DO   50 mg at 09/05/22 2017   Or  diphenhydrAMINE (BENADRYL) injection 50 mg  50 mg Intramuscular Q6H PRN [name], DO       haloperidol (HALDOL) tablet 5 mg  5 mg Oral Q6H PRN [name], DO   5 mg at 09/05/22 2017   Or   haloperidol lactate (HALDOL) injection 5 mg  5 mg Intramuscular Q6H PRN [name], DO       ibuprofen (ADVIL) tablet 600 mg  600 mg Oral Q6H PRN Andreka Stucki, [name], MD       LORazepam (ATIVAN) tablet 1 mg  1 mg Oral TID PRN [name], DO   1 mg at 09/04/22 1627   magnesium hydroxide (MILK OF MAGNESIA) suspension 30 mL  30 mL Oral Daily PRN [name], DO       paliperidone (INVEGA) 24 hr tablet 9 mg  9 mg Oral QHS Benicia Bergevin T, MD   9 mg at 09/05/22 2016   traZODone (DESYREL) tablet 50 mg  50 mg Oral QHS PRN [name], DO   50 mg at 09/05/22  2016    Lab Results: No results found for this or any previous visit (from the past 48 hour(s)).  Blood Alcohol level:  Lab Results  Component Value Date   ETH <10 08/17/2022   ETH <10 01/11/2021    Metabolic Disorder Labs: No results found for: "HGBA1C", "MPG" No results found for: "PROLACTIN" No results found for: "CHOL", "TRIG", "HDL", "CHOLHDL", "VLDL", "LDLCALC"  Physical Findings: AIMS:  , ,  ,  ,    CIWA:    COWS:     Musculoskeletal: Strength & Muscle Tone: within normal limits Gait & Station: normal Patient leans: N/A  Psychiatric Specialty Exam:  Presentation  General Appearance:  Casual; Neat  Eye Contact: Fair  Speech: Slow; Slurred  Speech Volume: Decreased  Handedness: Right   Mood and Affect  Mood: Anxious  Affect: Inappropriate   Thought Process  Thought Processes: Disorganized  Descriptions of Associations:Loose  Orientation:Partial  Thought Content:Illogical; Rumination  History of Schizophrenia/Schizoaffective disorder:No data recorded Duration of Psychotic Symptoms:No data recorded Hallucinations:No data recorded Ideas of Reference:Paranoia  Suicidal Thoughts:No data recorded Homicidal Thoughts:No data recorded  Sensorium  Memory: Immediate Fair; Remote Poor  Judgment: Poor  Insight: Poor   Executive Functions  Concentration: Poor  Attention Span: Fair  Recall: [employer] of Knowledge: Fair  Language: Fair   Psychomotor Activity  Psychomotor Activity:No data recorded  Assets  Assets: Communication Skills   Sleep  Sleep:No data recorded   Physical Exam: Physical Exam Vitals and nursing note reviewed.  Constitutional:      Appearance: Normal appearance.  HENT:     Head: Normocephalic and atraumatic.     Mouth/Throat:     Pharynx: Oropharynx is clear.  Eyes:     Pupils: Pupils are equal, round, and reactive to light.  Cardiovascular:     Rate and Rhythm: Normal rate and regular  rhythm.  Pulmonary:     Effort: Pulmonary effort is normal.     Breath sounds: Normal breath sounds.  Abdominal:     General: Abdomen is flat.     Palpations: Abdomen is soft.  Musculoskeletal:        General: Normal range of motion.  Skin:    General: Skin is warm and dry.  Neurological:     General: No focal deficit present.     Mental Status: She is alert. Mental status is at baseline.  Psychiatric:  Attention and Perception: She is inattentive.        Mood and Affect: Mood normal.        Speech: Speech is slurred.        Behavior: Behavior is cooperative.        Thought Content: Thought content normal.        Cognition and Memory: Cognition is impaired.    Review of Systems  Constitutional: Negative.   HENT: Negative.    Eyes: Negative.   Respiratory: Negative.    Cardiovascular: Negative.   Gastrointestinal: Negative.   Genitourinary:        Discharge   Musculoskeletal: Negative.   Skin: Negative.   Neurological: Negative.   Psychiatric/Behavioral:  Negative for depression and suicidal ideas.    Blood pressure 99/64, pulse 77, temperature 97.9 F (36.6 C), resp. rate (!) 23, height 5\' 6"  (1.676 m), weight 55.3 kg, SpO2 100 %. Body mass index is 19.69 kg/m.   Treatment Plan Summary: Plan has repeated this complaint to several people I will see if GYN can take a look at her.  Otherwise no change to overall treatment plan.  Patient believes she has a place to live which she does not  Mordecai Rasmussen, MD 09/06/2022, 5:33 PM

## 2022-09-06 NOTE — Progress Notes (Signed)
   09/06/22 0600  15 Minute Checks  Location Dayroom  Visual Appearance Calm  Behavior Composed  Sleep (Behavioral Health Patients Only)  Calculate sleep? (Click Yes once per 24 hr at 0600 safety check) Yes  Documented sleep last 24 hours 7.5

## 2022-09-06 NOTE — Group Note (Signed)
Date:  09/06/2022 Time:  8:53 PM  Group Topic/Focus:  Identifying Needs:   The focus of this group is to help patients identify their personal needs that have been historically problematic and identify healthy behaviors to address their needs.    Participation Level:  Did Not Attend  Participation Quality:    Did Not Attend  Affect:    Did Not Attend  Cognitive:    Did Not Attend  Insight: None  Engagement in Group:  None  Modes of Intervention:    Did Not Attend  Additional Comments:    [name] 09/06/2022, 8:53 PM

## 2022-09-06 NOTE — Consult Note (Cosign Needed Addendum)
Consult History and Physical   SERVICE: Gynecology    Patient Name: Betty Henderson Patient MRN:   [id number]  CC: vaginal discharge, vaginal discomfort  HPI: Betty Henderson is a 62 y.o. No obstetric history on file. with reports of vaginal discharge and vaginal discomfort. She reports clear vaginal discharge. She is unable to determine when the symptoms started.    Review of Systems: positives in bold GEN:   fevers, chills, weight changes, appetite changes, fatigue, night sweats HEENT:  HA, vision changes, hearing loss, congestion, rhinorrhea, sinus pressure, dysphagia CV:   CP, palpitations PULM:  SOB, cough GI:  abd pain, N/V/D/C GU:  dysuria, urgency, frequency MSK:  arthralgias, myalgias, back pain, swelling SKIN:  rashes, color changes, pallor NEURO:  numbness, weakness, tingling, seizures, dizziness, tremors PSYCH:  depression, anxiety, behavioral problems, confusion  HEME/LYMPH:  easy bruising or bleeding ENDO:  heat/cold intolerance  Past Obstetrical History: OB History   No obstetric history on file.     Past Gynecologic History: No LMP recorded. Patient is postmenopausal.   Past Medical History: Past Medical History:  Diagnosis Date   Anemia    Arthritis    Chronic pain    Drug-seeking behavior    Malingering    Osteopetrosis    Psychosis (HCC)    Schizoaffective disorder, bipolar type (HCC)     Past Surgical History:   Past Surgical History:  Procedure Laterality Date   MOUTH SURGERY     TUBAL LIGATION      Family History:  Family history is unknown by patient.  Social History:  Social History   Socioeconomic History   Marital status: Single    Spouse name: Not on file   Number of children: Not on file   Years of education: Not on file   Highest education level: Not on file  Occupational History   Not on file  Tobacco Use   Smoking status: Every Day    Packs/day: .5    Types: Cigarettes   Smokeless tobacco: Never  Vaping  Use   Vaping Use: Never used  Substance and Sexual Activity   Alcohol use: Not Currently    Comment: 1 cocktail 3 weeks ago   Drug use: Not Currently    Types: Cocaine    Comment: states "it's legal though"   Sexual activity: Never  Other Topics Concern   Not on file  Social History Narrative   Not on file   Social Determinants of Health   Financial Resource Strain: Not on file  Food Insecurity: Food Insecurity Present (08/22/2022)   Hunger Vital Sign    Worried About Running Out of Food in the Last Year: Sometimes true    Ran Out of Food in the Last Year: Sometimes true  Transportation Needs: Patient Unable To Answer (08/22/2022)   PRAPARE - Transportation    Lack of Transportation (Medical): Patient unable to answer    Lack of Transportation (Non-Medical): Patient unable to answer  Physical Activity: Not on file  Stress: Not on file  Social Connections: Not on file  Intimate Partner Violence: Not At Risk (08/22/2022)   Humiliation, Afraid, Rape, and Kick questionnaire    Fear of Current or Ex-Partner: No    Emotionally Abused: No    Physically Abused: No    Sexually Abused: No    Home Medications:  Medications reconciled in EPIC  No current facility-administered medications on file prior to encounter.   Current Outpatient Medications on File Prior to Encounter  Medication Sig Dispense Refill   ondansetron (ZOFRAN) 4 MG tablet Take 1 tablet (4 mg total) by mouth every 8 (eight) hours as needed for nausea or vomiting. (Patient not taking: Reported on 08/17/2022) 20 tablet 0   [DISCONTINUED] ranitidine (ZANTAC) 150 MG capsule Take 1 capsule (150 mg total) by mouth daily. (Patient not taking: Reported on 03/20/2018) 30 capsule 0    Allergies:  Allergies  Allergen Reactions   Other Itching    PT reports having environmental allergies which she used to receive shots for, but cannot specify exact allergens   Lemon Oil Rash   Proanthocyanidin Rash   Strawberry Extract Rash    Adhesive  [Tape] Rash   Cherry Rash   Wound Dressing Adhesive Rash    Physical Exam:  Temp:  [97.9 F (36.6 C)-98.4 F (36.9 C)] 98.4 F (36.9 C) (06/14 2015) Pulse Rate:  [77-86] 86 (06/14 2015) BP: (99-106)/(58-64) 106/58 (06/14 2015) SpO2:  [100 %] 100 % (06/14 2015)   General Appearance:  Well developed, well nourished, no acute distress, alert and oriented x3 HEENT:  Normocephalic atraumatic, extraocular movements intact, moist mucous membranes Cardiovascular:  Normal S1/S2, regular rate and rhythm, no murmurs Pulmonary:  clear to auscultation, no wheezes, rales or rhonchi, symmetric air entry, good air exchange Abdomen:  Bowel sounds present, soft, nontender, nondistended, no abnormal masses, no epigastric pain Extremities:  Full range of motion, no pedal edema, 2+ distal pulses, no tenderness Skin:  normal coloration and turgor, no rashes, no suspicious skin lesions noted  Neurologic:  Cranial nerves 2-12 grossly intact, normal muscle tone, strength 5/5 all four extremities Psychiatric:  Normal mood and affect, appropriate, no AH/VH Pelvic:  NEFG, no vulvar masses or lesions, atrophic vaginal mucosa, no vaginal bleeding or discharge, cervix atrophic, without lesions or erythema,    Labs/Studies:   CBC and Coags:  Lab Results  Component Value Date   WBC 4.9 08/17/2022   NEUTOPHILPCT 47 08/17/2022   EOSPCT 4 08/17/2022   BASOPCT 1 08/17/2022   LYMPHOPCT 39 08/17/2022   HGB 10.9 (L) 08/17/2022   HCT 34.6 (L) 08/17/2022   MCV 96.1 08/17/2022   PLT 319 08/17/2022   CMP:  Lab Results  Component Value Date   NA 137 08/17/2022   K 3.8 08/17/2022   CL 105 08/17/2022   CO2 23 08/17/2022   BUN 6 (L) 08/17/2022   CREATININE 0.72 08/17/2022   CREATININE 0.92 07/21/2022   CREATININE 0.62 06/07/2022   PROT 6.5 08/17/2022   BILITOT 0.5 08/17/2022   BILIDIR 0.2 12/03/2016   ALT 11 08/17/2022   AST 14 (L) 08/17/2022   ALKPHOS 47 08/17/2022   Other Labs: - GC/CL -  wet prep  TVUS:  deferred Other Imaging: CT Head Wo Contrast  Result Date: 08/17/2022 CLINICAL DATA:  Mental status change, unknown cause EXAM: CT HEAD WITHOUT CONTRAST TECHNIQUE: Contiguous axial images were obtained from the base of the skull through the vertex without intravenous contrast. RADIATION DOSE REDUCTION: This exam was performed according to the departmental dose-optimization program which includes automated exposure control, adjustment of the mA and/or kV according to patient size and/or use of iterative reconstruction technique. COMPARISON:  None Available. FINDINGS: Brain: No acute intracranial abnormality. Specifically, no hemorrhage, hydrocephalus, mass lesion, acute infarction, or significant intracranial injury. Vascular: No hyperdense vessel or unexpected calcification. Skull: No acute calvarial abnormality. Sinuses/Orbits: No acute findings Other: None IMPRESSION: Normal study. Electronically Signed   By: Charlett Nose M.D.   On: 08/17/2022 18:34  DG Chest 2 View  Result Date: 08/12/2022 CLINICAL DATA:  Productive cough EXAM: CHEST - 2 VIEW COMPARISON:  01/09/2021 FINDINGS: The heart size and mediastinal contours are within normal limits. Both lungs are clear. The visualized skeletal structures are unremarkable. IMPRESSION: No active cardiopulmonary disease. Electronically Signed   By: Jasmine Pang M.D.   On: 08/12/2022 22:36     Assessment / Plan:   Betty Henderson is a 62 y.o. No obstetric history on file. who presents with vaginal discharge and vaginal discomfort.  1. Speculum exam revealed atrophic and dry vagina and cervix. Scant clear vaginal discharge noted. GC/CL and wet prep collected. No acute infections found.  Thank you for the opportunity to be involved with this pt's care.   Janyce Llanos, CNM 09/06/2022 10:55 PM

## 2022-09-06 NOTE — Progress Notes (Signed)
Patient is alert and oriented. She denies anxiety and depression. She denies SI/HI/AVH. Appetite good. Patient continues to respond to internal stimuli but is not physically aggressive.  Gynecology consult placed for complaints of vaginal discharge and other issue. Support and encouragement provided.  Q15 minute unit checks in place.

## 2022-09-06 NOTE — Group Note (Signed)
Date:  09/06/2022 Time:  6:07 PM  Group Topic/Focus:  Music therapy/ outside therapeutic games. Teamwork, social skills     Participation Level:  Active  Participation Quality:  Appropriate  Affect:  Appropriate  Cognitive:  Alert and Lacking  Insight: Lacking  Engagement in Group:  Lacking  Modes of Intervention:  Activity  Additional Comments:    Doug Sou 09/06/2022, 6:07 PM

## 2022-09-06 NOTE — Group Note (Signed)
Date:  09/06/2022 Time:  2:36 AM  Group Topic/Focus:  Wrap-Up Group:   The focus of this group is to help patients review their daily goal of treatment and discuss progress on daily workbooks.    Participation Level:  Minimal  Participation Quality:  Redirectable  Affect:  Appropriate  Cognitive:  Oriented  Insight: Good  Engagement in Group:  Engaged  Modes of Intervention:  Discussion  Additional Comments:  Discussed how patients day was.  Roberto Scales 09/06/2022, 2:36 AM

## 2022-09-07 DIAGNOSIS — F250 Schizoaffective disorder, bipolar type: Secondary | ICD-10-CM | POA: Diagnosis not present

## 2022-09-07 NOTE — BH IP Treatment Plan (Addendum)
Interdisciplinary Treatment and Diagnostic Plan Update  09/07/2022 Time of Session: 9:10 am Betty Henderson MRN: [id number]  Principal Diagnosis: Schizoaffective disorder, bipolar type [facility])  Secondary Diagnoses: Principal Problem:   Schizoaffective disorder, bipolar type (HCC)   Current Medications:  Current Facility-Administered Medications  Medication Dose Route Frequency Provider Last Rate Last Admin   acetaminophen (TYLENOL) tablet 650 mg  650 mg Oral Q6H PRN [name], DO   650 mg at 08/24/22 0110   alum & mag hydroxide-simeth (MAALOX/MYLANTA) 200-200-20 MG/5ML suspension 30 mL  30 mL Oral Q4H PRN [name], DO       diphenhydrAMINE (BENADRYL) capsule 50 mg  50 mg Oral Q6H PRN [name], DO   50 mg at 09/06/22 2123   Or   diphenhydrAMINE (BENADRYL) injection 50 mg  50 mg Intramuscular Q6H PRN [name], DO       haloperidol (HALDOL) tablet 5 mg  5 mg Oral Q6H PRN [name], DO   5 mg at 09/06/22 2124   Or   haloperidol lactate (HALDOL) injection 5 mg  5 mg Intramuscular Q6H PRN [name], DO       ibuprofen (ADVIL) tablet 600 mg  600 mg Oral Q6H PRN [name], [name], MD       LORazepam (ATIVAN) tablet 1 mg  1 mg Oral TID PRN [name], DO   1 mg at 09/04/22 1627   magnesium hydroxide (MILK OF MAGNESIA) suspension 30 mL  30 mL Oral Daily PRN [name], DO       paliperidone (INVEGA) 24 hr tablet 9 mg  9 mg Oral QHS [name], [name], MD   9 mg at 09/06/22 2123   traZODone (DESYREL) tablet 50 mg  50 mg Oral QHS PRN [name], DO   50 mg at 09/06/22 2123   PTA Medications: Medications Prior to Admission  Medication Sig Dispense Refill Last Dose   ondansetron (ZOFRAN) 4 MG tablet Take 1 tablet (4 mg total) by mouth every 8 (eight) hours as needed for nausea or vomiting. (Patient not taking: Reported on 08/17/2022) 20 tablet 0     Patient Stressors: Financial  difficulties   Health problems   Medication change or noncompliance    Patient Strengths: Ability for insight   Treatment Modalities: Medication Management, Group therapy, Case management,  1 to 1 session with clinician, Psychoeducation, Recreational therapy.   Physician Treatment Plan for Primary Diagnosis: Schizoaffective disorder, bipolar type (HCC) Long Term Goal(s): Improvement in symptoms so as ready for discharge   Short Term Goals: Ability to identify changes in lifestyle to reduce recurrence of condition will improve Ability to verbalize feelings will improve Ability to disclose and discuss suicidal ideas Ability to demonstrate self-control will improve Ability to identify and develop effective coping behaviors will improve Ability to maintain clinical measurements within normal limits will improve Compliance with prescribed medications will improve Ability to identify triggers associated with substance abuse/mental health issues will improve  Medication Management: Evaluate patient's response, side effects, and tolerance of medication regimen.  Therapeutic Interventions: 1 to 1 sessions, Unit Group sessions and Medication administration.  Evaluation of Outcomes: Progressing  Physician Treatment Plan for Secondary Diagnosis: Principal Problem:   Schizoaffective disorder, bipolar type (HCC)  Long Term Goal(s): Improvement in symptoms so as ready for discharge   Short Term Goals: Ability to identify changes in lifestyle to reduce recurrence of condition will improve Ability to verbalize feelings will improve Ability  to disclose and discuss suicidal ideas Ability to demonstrate self-control will improve Ability to identify and develop effective coping behaviors will improve Ability to maintain clinical measurements within normal limits will improve Compliance with prescribed medications will improve Ability to identify triggers associated with substance abuse/mental  health issues will improve     Medication Management: Evaluate patient's response, side effects, and tolerance of medication regimen.  Therapeutic Interventions: 1 to 1 sessions, Unit Group sessions and Medication administration.  Evaluation of Outcomes: Progressing   RN Treatment Plan for Primary Diagnosis: Schizoaffective disorder, bipolar type (HCC) Long Term Goal(s): Knowledge of disease and therapeutic regimen to maintain health will improve  Short Term Goals: Ability to verbalize frustration and anger appropriately will improve, Ability to demonstrate self-control, Ability to participate in decision making will improve, Ability to verbalize feelings will improve, Ability to identify and develop effective coping behaviors will improve, and Compliance with prescribed medications will improve  Medication Management: RN will administer medications as ordered by provider, will assess and evaluate patient's response and provide education to patient for prescribed medication. RN will report any adverse and/or side effects to prescribing provider.  Therapeutic Interventions: 1 on 1 counseling sessions, Psychoeducation, Medication administration, Evaluate responses to treatment, Monitor vital signs and CBGs as ordered, Perform/monitor CIWA, COWS, AIMS and Fall Risk screenings as ordered, Perform wound care treatments as ordered.  Evaluation of Outcomes: Progressing   LCSW Treatment Plan for Primary Diagnosis: Schizoaffective disorder, bipolar type (HCC) Long Term Goal(s): Safe transition to appropriate next level of care at discharge, Engage patient in therapeutic group addressing interpersonal concerns.  Short Term Goals: Engage patient in aftercare planning with referrals and resources, Increase social support, Increase ability to appropriately verbalize feelings, Increase emotional regulation, Facilitate acceptance of mental health diagnosis and concerns, and Increase skills for wellness and  recovery  Therapeutic Interventions: Assess for all discharge needs, 1 to 1 time with Social worker, Explore available resources and support systems, Assess for adequacy in community support network, Educate family and significant other(s) on suicide prevention, Complete Psychosocial Assessment, Interpersonal group therapy.  Evaluation of Outcomes: Progressing   Progress in Treatment: Attending groups: No. Participating in groups: No. Taking medication as prescribed: Yes. Toleration medication: Yes. Family/Significant other contact made: No, will contact:  Patient declined Patient understands diagnosis: Yes. Discussing patient identified problems/goals with staff: Yes. Medical problems stabilized or resolved: Yes. Denies suicidal/homicidal ideation: Yes. Issues/concerns per patient self-inventory:None  Other: none  New problem(s) identified: No, Describe:  none  New Short Term/Long Term Goal(s): detox, elimination of symptoms of psychosis, medication management for mood stabilization; elimination of SI thoughts; development of comprehensive mental wellness/sobriety plan.  Update 08/28/2022:  No changes at this time.  Update 09/02/2022: No changes at this time. Update 09/07/22: There are no changes at this time.   Patient Goals:  Patient was present in group. Patient too disorganized to provide a goal at this time. Update 08/28/2022:  No changes at this time. Update 09/02/2022: No changes at this time. Update 09/07/22: There are no changes at this time.    Discharge Plan or Barriers: CSW to assist patient in development of appropriate discharge plans.  Update 08/28/2022:  Patient remains psychotic at this time.  Progress has been limited.  Unable to identify placement for patient.  Discussions are being had on contacting DSS to make possible APS report for guardianship for patient. Update 09/02/2022: CSW spoke with the patients son who reports that patient is at baseline.  He reports that this  has  been his mothers disposition for 20 years.  He reports that she does not have any supports and is currently homeless.  CSW to continue to assist in development of a safe discharge plan. Update 09/02/2022: The patient doesn't have the supports that she can stay with or provide any real support to her. An email was sent about making an APS report due to safety concerns and possible need for a guardian.   Reason for Continuation of Hospitalization: Delusions  Depression Medication stabilization  Estimated Length of Stay:  Update 08/28/2022:  No changes at this time. Update 09/02/2022: TBD.  Update 09/02/2022: TBD  Last 3 [place] Suicide Severity Risk Score: Flowsheet Row Admission (Current) from 08/22/2022 in [facility] from 08/17/2022 in [facility] Department at [facility] ED from 08/12/2022 in [facility] Emergency Department at [facility]  C-SSRS RISK CATEGORY Low Risk No Risk No Risk       Last [facility] 2/9 Scores:     No data to display          Scribe for Treatment Team: Marshell Levan, LCSW 09/07/2022 9:09 AM

## 2022-09-07 NOTE — Progress Notes (Signed)
   09/07/22 0550  15 Minute Checks  Location Bathroom/Shower  Visual Appearance Calm  Behavior Composed  Sleep (Behavioral Health Patients Only)  Calculate sleep? (Click Yes once per 24 hr at 0600 safety check) Yes  Documented sleep last 24 hours 9

## 2022-09-07 NOTE — [facility] Group Notes (Signed)
[facility] Group Notes:  (Nursing/MHT/Case Management/Adjunct)  Date:  09/07/2022  Time:  9:53 AM  Type of Therapy:  Movement Therapy  Participation Level:  Did Not Attend   Ethin Drummond P Jaeson Molstad 09/07/2022, 9:53 AM 

## 2022-09-07 NOTE — Progress Notes (Signed)
Patient is alert and oriented. Patient denies SI/HI/AVH. She denies SI/HI/AVH. No anxiety or depression noted or voiced. Denies pain. Patient appeared less agitated today than on previous encounters.  Gynecology assessment results from yesterday indicate scant clear vaginal discharge and no acute infections noted. No new orders.  Q15 minute unit checks in place.

## 2022-09-07 NOTE — Progress Notes (Signed)
[facility] MD Progress Note  09/07/2022 10:45 AM Cian Baldassarre  MRN:  [id number] Subjective: Follow-up 62 year old woman with schizophrenia or schizoaffective disorder.  Patient has no specific complaints today.  No longer mentioning the vaginal complaints.  I reviewed the chart and saw that gynecology came by to see her last night and found no discharge no abnormality whatsoever on exam.  Patient says she is feeling fine today.  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  Responds to reasonable redirection.  No medicine side effects. Principal Problem: Schizoaffective disorder, bipolar type (HCC) Diagnosis: Principal Problem:   Schizoaffective disorder, bipolar type (HCC)  Total Time spent with patient: 30 minutes  Past Psychiatric History: Past history of schizophrenia or schizoaffective disorder  Past Medical History:  Past Medical History:  Diagnosis Date   Anemia    Arthritis    Chronic pain    Drug-seeking behavior    Malingering    Osteopetrosis    Psychosis (HCC)    Schizoaffective disorder, bipolar type (HCC)     Past Surgical History:  Procedure Laterality Date   MOUTH SURGERY     TUBAL LIGATION     Family History:  Family History  Family history unknown: Yes   Family Psychiatric  History: See previous Social History:  Social History   Substance and Sexual Activity  Alcohol Use Not Currently   Comment: 1 cocktail 3 weeks ago     Social History   Substance and Sexual Activity  Drug Use Not Currently   Types: Cocaine   Comment: states "it's legal though"    Social History   Socioeconomic History   Marital status: Single    Spouse name: Not on file   Number of children: Not on file   Years of education: Not on file   Highest education level: Not on file  Occupational History   Not on file  Tobacco Use   Smoking status: Every Day    Packs/day: .5    Types: Cigarettes   Smokeless tobacco: Never  Vaping Use   Vaping  Use: Never used  Substance and Sexual Activity   Alcohol use: Not Currently    Comment: 1 cocktail 3 weeks ago   Drug use: Not Currently    Types: Cocaine    Comment: states "it's legal though"   Sexual activity: Never  Other Topics Concern   Not on file  Social History Narrative   Not on file   Social Determinants of Health   Financial Resource Strain: Not on file  Food Insecurity: Food Insecurity Present (08/22/2022)   Hunger Vital Sign    Worried About Running Out of Food in the Last Year: Sometimes true    Ran Out of Food in the Last Year: Sometimes true  Transportation Needs: Patient Unable To Answer (08/22/2022)   PRAPARE - Administrator, Civil Service (Medical): Patient unable to answer    Lack of Transportation (Non-Medical): Patient unable to answer  Physical Activity: Not on file  Stress: Not on file  Social Connections: Not on file   Additional Social History:                         Sleep: Fair  Appetite:  Fair  Current Medications: Current Facility-Administered Medications  Medication Dose Route Frequency Provider Last Rate Last Admin   acetaminophen (TYLENOL) tablet 650 mg  650 mg Oral Q6H PRN [name],  DO   650 mg at 08/24/22 0110   alum & mag hydroxide-simeth (MAALOX/MYLANTA) 200-200-20 MG/5ML suspension 30 mL  30 mL Oral Q4H PRN [name], DO       diphenhydrAMINE (BENADRYL) capsule 50 mg  50 mg Oral Q6H PRN [name], DO   50 mg at 09/06/22 2123   Or   diphenhydrAMINE (BENADRYL) injection 50 mg  50 mg Intramuscular Q6H PRN [name], DO       haloperidol (HALDOL) tablet 5 mg  5 mg Oral Q6H PRN [name], DO   5 mg at 09/06/22 2124   Or   haloperidol lactate (HALDOL) injection 5 mg  5 mg Intramuscular Q6H PRN [name], DO       ibuprofen (ADVIL) tablet 600 mg  600 mg Oral Q6H PRN Eliya Bubar, [name], MD       LORazepam (ATIVAN) tablet 1 mg  1 mg Oral  TID PRN [name], DO   1 mg at 09/04/22 1627   magnesium hydroxide (MILK OF MAGNESIA) suspension 30 mL  30 mL Oral Daily PRN [name], DO       paliperidone (INVEGA) 24 hr tablet 9 mg  9 mg Oral QHS Cierah Crader T, MD   9 mg at 09/06/22 2123   traZODone (DESYREL) tablet 50 mg  50 mg Oral QHS PRN [name], DO   50 mg at 09/06/22 2123    Lab Results:  Results for orders placed or performed during the hospital encounter of 08/22/22 (from the past 48 hour(s))  Wet prep, genital     Status: Abnormal   Collection Time: 09/06/22  6:50 PM  Result Value Ref Range   Yeast Wet Prep HPF POC NONE SEEN NONE SEEN   Trich, Wet Prep NONE SEEN NONE SEEN   Clue Cells Wet Prep HPF POC NONE SEEN NONE SEEN   WBC, Wet Prep HPF POC >=10 (A) <10   Sperm NONE SEEN     Comment: Performed at [facility], [address], [place], [place] [address]  Chlamydia/NGC rt PCR [facility] only)     Status: None   Collection Time: 09/06/22  6:50 PM   Specimen: Cervical/Vaginal swab; Genital  Result Value Ref Range   Specimen source GC/Chlam VAG    Chlamydia Tr NOT DETECTED NOT DETECTED   N gonorrhoeae NOT DETECTED NOT DETECTED    Comment: (NOTE) This CT/NG assay has not been evaluated in patients with a history of  hysterectomy. Performed at [facility], [address] [place], [place] [address]     Blood Alcohol level:  Lab Results  Component Value Date   [facility] <10 08/17/2022   ETH <10 01/11/2021    Metabolic Disorder Labs: No results found for: "HGBA1C", "MPG" No results found for: "PROLACTIN" No results found for: "CHOL", "TRIG", "HDL", "CHOLHDL", "VLDL", "LDLCALC"  Physical Findings: AIMS:  , ,  ,  ,    CIWA:    COWS:     Musculoskeletal: Strength & Muscle Tone: within normal limits Gait & Station: normal Patient leans: N/A  Psychiatric Specialty Exam:  Presentation  General Appearance:  Casual; Neat  Eye  Contact: Fair  Speech: Slow; Slurred  Speech Volume: Decreased  Handedness: Right   Mood and Affect  Mood: Anxious  Affect: Inappropriate   Thought Process  Thought Processes: Disorganized  Descriptions of Associations:Loose  Orientation:Partial  Thought Content:Illogical; Rumination  History of Schizophrenia/Schizoaffective disorder:No data recorded Duration  of Psychotic Symptoms:No data recorded Hallucinations:No data recorded Ideas of Reference:Paranoia  Suicidal Thoughts:No data recorded Homicidal Thoughts:No data recorded  Sensorium  Memory: Immediate Fair; Remote Poor  Judgment: Poor  Insight: Poor   Executive Functions  Concentration: Poor  Attention Span: Fair  Recall: [employer] of Knowledge: Fair  Language: Fair   Psychomotor Activity  Psychomotor Activity:No data recorded  Assets  Assets: Communication Skills   Sleep  Sleep:No data recorded   Physical Exam: Physical Exam Vitals and nursing note reviewed.  Constitutional:      Appearance: Normal appearance.  HENT:     Head: Normocephalic and atraumatic.     Mouth/Throat:     Pharynx: Oropharynx is clear.  Eyes:     Pupils: Pupils are equal, round, and reactive to light.  Cardiovascular:     Rate and Rhythm: Normal rate and regular rhythm.  Pulmonary:     Effort: Pulmonary effort is normal.     Breath sounds: Normal breath sounds.  Abdominal:     General: Abdomen is flat.     Palpations: Abdomen is soft.  Musculoskeletal:        General: Normal range of motion.  Skin:    General: Skin is warm and dry.  Neurological:     General: No focal deficit present.     Mental Status: She is alert. Mental status is at baseline.  Psychiatric:        Attention and Perception: Attention normal.        Mood and Affect: Mood normal. Affect is blunt.        Speech: Speech is tangential.        Behavior: Behavior is cooperative.        Thought Content: Thought content  is delusional.        Cognition and Memory: Cognition is impaired.    Review of Systems  Constitutional: Negative.   HENT: Negative.    Eyes: Negative.   Respiratory: Negative.    Cardiovascular: Negative.   Gastrointestinal: Negative.   Musculoskeletal: Negative.   Skin: Negative.   Neurological: Negative.   Psychiatric/Behavioral: Negative.     Blood pressure 111/79, pulse 82, temperature 97.8 F (36.6 C), resp. rate 18, height 5\' 6"  (1.676 m), weight 55.3 kg, SpO2 100 %. Body mass index is 19.69 kg/m.   Treatment Plan Summary: Plan no change to medicine.  She is still on the oral Invega to get.  We will give it a few more days until it is about a week out and then give her the second Invega shot .  It looks like in the past she usually took the large size shot so we will just give her a second 1 of those.  Supportive encouragement to her.  Told her that I thought that staying in the hospital was still in her best interest and would not affect her job.  Do not anticipate specific behavior problems today.  Mordecai Rasmussen, MD 09/07/2022, 10:45 AM

## 2022-09-07 NOTE — Group Note (Signed)
Date:  09/07/2022 Time:  9:37 PM  Group Topic/Focus:  Recovery Goals:   The focus of this group is to identify appropriate goals for recovery and establish a plan to achieve them.    Participation Level:  Did Not Attend  Participation Quality:   Did Not Attend  Affect:   Did Not Attend  Cognitive:   Did Not Attend  Insight: None  Engagement in Group:  None  Modes of Intervention:   socialization  Additional Comments:    [name] 09/07/2022, 9:37 PM

## 2022-09-08 DIAGNOSIS — F250 Schizoaffective disorder, bipolar type: Secondary | ICD-10-CM | POA: Diagnosis not present

## 2022-09-08 NOTE — Group Note (Signed)
Date:  09/08/2022 Time:  12:25 PM  Group Topic/Focus:  Dimensions of Wellness:   The focus of this group is to introduce the topic of wellness and discuss the role each dimension of wellness plays in total health. Self Care:   The focus of this group is to help patients understand the importance of self-care in order to improve or restore emotional, physical, spiritual, interpersonal, and financial health. Who is our support system    Participation Level:  Minimal  Participation Quality:  Inattentive  Affect:  Appropriate  Cognitive:  Disorganized  Insight: Lacking  Engagement in Group:  Limited  Modes of Intervention:  Discussion  Additional Comments:    Betty Henderson 09/08/2022, 12:25 PM

## 2022-09-08 NOTE — Group Note (Signed)
LCSW Group Therapy Note   Group Date: 09/08/2022 Start Time: 1315 End Time: 1355   Type of Therapy and Topic:  Group Therapy: Geriatric Depression  Participation Level:  Did Not Attend    Summary of Patient Progress:  The patient did not attend group.     Marshell Levan, LCSWA 09/08/2022  2:08 PM

## 2022-09-08 NOTE — Progress Notes (Signed)
Pt was slightly irritable earlier in shift today. Pt was able to join peers for free time movie this afternoon. Pt denies any s/I,H/I or AVH but clearly responds to internal stimuli.

## 2022-09-08 NOTE — Plan of Care (Signed)
D- Patient alert and oriented.  Denies SI, HI, AVH, and pain. Pt denies AVH but is clearly reponding to internal stimuli  A- Scheduled medications administered to patient, per MD orders. Support and encouragement provided.  Routine safety checks conducted every 15 minutes.  Patient informed to notify staff with problems or concerns.  R- No adverse drug reactions noted. Patient contracts for safety at this time. Patient compliant with medications and treatment plan. Patient receptive, calm, and cooperative. Patient interacts well with others on the unit.  Patient remains safe at this time.

## 2022-09-08 NOTE — Progress Notes (Signed)
   09/08/22 0400  Psych Admission Type (Psych Patients Only)  Admission Status Voluntary  Psychosocial Assessment  Patient Complaints Irritability  Eye Contact Brief  Facial Expression Flat  Affect Angry  Speech Loud  Interaction Minimal  Motor Activity Slow  Appearance/Hygiene In scrubs  Behavior Characteristics Irritable  Mood Labile  Thought Process  Coherency Disorganized  Content Delusions  Delusions Paranoid  Perception Hallucinations  Hallucination Auditory;Visual  Judgment Impaired  Confusion Mild  Danger to Self  Current suicidal ideation? Denies  Danger to Others  Danger to Others None reported or observed

## 2022-09-08 NOTE — Group Note (Signed)
Date:  09/08/2022 Time:  11:28 PM  Group Topic/Focus:  Building Self Esteem:   The Focus of this group is helping patients become aware of the effects of self-esteem on their lives, the things they and others do that enhance or undermine their self-esteem, seeing the relationship between their level of self-esteem and the choices they make and learning ways to enhance self-esteem. Coping With Mental Health Crisis:   The purpose of this group is to help patients identify strategies for coping with mental health crisis.  Group discusses possible causes of crisis and ways to manage them effectively. Developing a Wellness Toolbox:   The focus of this group is to help patients develop a "wellness toolbox" with skills and strategies to promote recovery upon discharge. Goals Group:   The focus of this group is to help patients establish daily goals to achieve during treatment and discuss how the patient can incorporate goal setting into their daily lives to aide in recovery. Healthy Communication:   The focus of this group is to discuss communication, barriers to communication, as well as healthy ways to communicate with others. Making Healthy Choices:   The focus of this group is to help patients identify negative/unhealthy choices they were using prior to admission and identify positive/healthier coping strategies to replace them upon discharge. Managing Feelings:   The focus of this group is to identify what feelings patients have difficulty handling and develop a plan to handle them in a healthier way upon discharge. Overcoming Stress:   The focus of this group is to define stress and help patients assess their triggers. Personal Choices and Values:   The focus of this group is to help patients assess and explore the importance of values in their lives, how their values affect their decisions, how they express their values and what opposes their expression.    Participation Level:   Minimal  Participation Quality:  Appropriate  Affect:  Appropriate  Cognitive:  Appropriate  Insight: Appropriate  Engagement in Group:  Improving  Modes of Intervention:  Discussion  Additional Comments:    [name] 09/08/2022, 11:28 PM

## 2022-09-08 NOTE — Progress Notes (Signed)
[facility] MD Progress Note  09/08/2022 1:53 PM Betty Henderson  MRN:  [id number] Subjective: Follow-up patient with schizophrenia.  No new complaints.  Stays to herself much of the time.  No agitated behavior recently.  Denies having hallucinations.  Still confused and disorganized in speech.  We are still focusing on discharge planning Principal Problem: Schizoaffective disorder, bipolar type (HCC) Diagnosis: Principal Problem:   Schizoaffective disorder, bipolar type (HCC)  Total Time spent with patient: 45 minutes  Past Psychiatric History: Past history of recurrent mood problems and schizophrenia  Past Medical History:  Past Medical History:  Diagnosis Date   Anemia    Arthritis    Chronic pain    Drug-seeking behavior    Malingering    Osteopetrosis    Psychosis (HCC)    Schizoaffective disorder, bipolar type (HCC)     Past Surgical History:  Procedure Laterality Date   MOUTH SURGERY     TUBAL LIGATION     Family History:  Family History  Family history unknown: Yes   Family Psychiatric  History: See previous Social History:  Social History   Substance and Sexual Activity  Alcohol Use Not Currently   Comment: 1 cocktail 3 weeks ago     Social History   Substance and Sexual Activity  Drug Use Not Currently   Types: Cocaine   Comment: states "it's legal though"    Social History   Socioeconomic History   Marital status: Single    Spouse name: Not on file   Number of children: Not on file   Years of education: Not on file   Highest education level: Not on file  Occupational History   Not on file  Tobacco Use   Smoking status: Every Day    Packs/day: .5    Types: Cigarettes   Smokeless tobacco: Never  Vaping Use   Vaping Use: Never used  Substance and Sexual Activity   Alcohol use: Not Currently    Comment: 1 cocktail 3 weeks ago   Drug use: Not Currently    Types: Cocaine    Comment: states "it's legal though"   Sexual activity: Never  Other  Topics Concern   Not on file  Social History Narrative   Not on file   Social Determinants of Health   Financial Resource Strain: Not on file  Food Insecurity: Food Insecurity Present (08/22/2022)   Hunger Vital Sign    Worried About Running Out of Food in the Last Year: Sometimes true    Ran Out of Food in the Last Year: Sometimes true  Transportation Needs: Patient Unable To Answer (08/22/2022)   PRAPARE - Administrator, Civil Service (Medical): Patient unable to answer    Lack of Transportation (Non-Medical): Patient unable to answer  Physical Activity: Not on file  Stress: Not on file  Social Connections: Not on file   Additional Social History:                         Sleep: Fair  Appetite:  Fair  Current Medications: Current Facility-Administered Medications  Medication Dose Route Frequency Provider Last Rate Last Admin   acetaminophen (TYLENOL) tablet 650 mg  650 mg Oral Q6H PRN [name], DO   650 mg at 08/24/22 0110   alum & mag hydroxide-simeth (MAALOX/MYLANTA) 200-200-20 MG/5ML suspension 30 mL  30 mL Oral Q4H PRN [name], DO       diphenhydrAMINE (BENADRYL) capsule 50  mg  50 mg Oral Q6H PRN [name], DO   50 mg at 09/06/22 2123   Or   diphenhydrAMINE (BENADRYL) injection 50 mg  50 mg Intramuscular Q6H PRN [name], DO       haloperidol (HALDOL) tablet 5 mg  5 mg Oral Q6H PRN [name], DO   5 mg at 09/06/22 2124   Or   haloperidol lactate (HALDOL) injection 5 mg  5 mg Intramuscular Q6H PRN [name], DO       ibuprofen (ADVIL) tablet 600 mg  600 mg Oral Q6H PRN Avrey Flanagin, [name], MD       LORazepam (ATIVAN) tablet 1 mg  1 mg Oral TID PRN [name], DO   1 mg at 09/04/22 1627   magnesium hydroxide (MILK OF MAGNESIA) suspension 30 mL  30 mL Oral Daily PRN [name], DO       paliperidone (INVEGA) 24 hr tablet 9 mg  9 mg Oral QHS  Aiden Helzer T, MD   9 mg at 09/07/22 2132   traZODone (DESYREL) tablet 50 mg  50 mg Oral QHS PRN [name], DO   50 mg at 09/07/22 2132    Lab Results:  Results for orders placed or performed during the hospital encounter of 08/22/22 (from the past 48 hour(s))  Wet prep, genital     Status: Abnormal   Collection Time: 09/06/22  6:50 PM  Result Value Ref Range   Yeast Wet Prep HPF POC NONE SEEN NONE SEEN   Trich, Wet Prep NONE SEEN NONE SEEN   Clue Cells Wet Prep HPF POC NONE SEEN NONE SEEN   WBC, Wet Prep HPF POC >=10 (A) <10   Sperm NONE SEEN     Comment: Performed at [facility], [address], [place], [place] [address]  Chlamydia/NGC rt PCR [facility] only)     Status: None   Collection Time: 09/06/22  6:50 PM   Specimen: Cervical/Vaginal swab; Genital  Result Value Ref Range   Specimen source GC/Chlam VAG    Chlamydia Tr NOT DETECTED NOT DETECTED   N gonorrhoeae NOT DETECTED NOT DETECTED    Comment: (NOTE) This CT/NG assay has not been evaluated in patients with a history of  hysterectomy. Performed at [facility], [address] [place], [place] [address]     Blood Alcohol level:  Lab Results  Component Value Date   [facility] <10 08/17/2022   ETH <10 01/11/2021    Metabolic Disorder Labs: No results found for: "HGBA1C", "MPG" No results found for: "PROLACTIN" No results found for: "CHOL", "TRIG", "HDL", "CHOLHDL", "VLDL", "LDLCALC"  Physical Findings: AIMS:  , ,  ,  ,    CIWA:    COWS:     Musculoskeletal: Strength & Muscle Tone: within normal limits Gait & Station: normal Patient leans: N/A  Psychiatric Specialty Exam:  Presentation  General Appearance:  Casual; Neat  Eye Contact: Fair  Speech: Slow; Slurred  Speech Volume: Decreased  Handedness: Right   Mood and Affect  Mood: Anxious  Affect: Inappropriate   Thought Process  Thought Processes: Disorganized  Descriptions of  Associations:Loose  Orientation:Partial  Thought Content:Illogical; Rumination  History of Schizophrenia/Schizoaffective disorder:No data recorded Duration of Psychotic Symptoms:No data recorded Hallucinations:No data recorded Ideas of Reference:Paranoia  Suicidal Thoughts:No data recorded Homicidal Thoughts:No data recorded  Sensorium  Memory: Immediate Fair; Remote Poor  Judgment: Poor  Insight: Poor   Executive Functions  Concentration:  Poor  Attention Span: Fair  Recall: [employer] of Knowledge: Fair  Language: Fair   Psychomotor Activity  Psychomotor Activity:No data recorded  Assets  Assets: Communication Skills   Sleep  Sleep:No data recorded   Physical Exam: Physical Exam Vitals reviewed.  Constitutional:      Appearance: Normal appearance.  HENT:     Head: Normocephalic and atraumatic.     Mouth/Throat:     Pharynx: Oropharynx is clear.  Eyes:     Pupils: Pupils are equal, round, and reactive to light.  Cardiovascular:     Rate and Rhythm: Normal rate and regular rhythm.  Pulmonary:     Effort: Pulmonary effort is normal.     Breath sounds: Normal breath sounds.  Abdominal:     General: Abdomen is flat.     Palpations: Abdomen is soft.  Musculoskeletal:        General: Normal range of motion.  Skin:    General: Skin is warm and dry.  Neurological:     General: No focal deficit present.     Mental Status: She is alert. Mental status is at baseline.  Psychiatric:        Attention and Perception: Attention normal.        Mood and Affect: Mood normal. Affect is blunt.        Speech: She is noncommunicative.        Behavior: Behavior is withdrawn.        Thought Content: Thought content normal.        Cognition and Memory: Cognition is impaired.    Review of Systems  Constitutional: Negative.   HENT: Negative.    Eyes: Negative.   Respiratory: Negative.    Cardiovascular: Negative.   Gastrointestinal: Negative.    Musculoskeletal: Negative.   Skin: Negative.   Neurological: Negative.   Psychiatric/Behavioral: Negative.     Blood pressure 105/71, pulse 94, temperature 97.7 F (36.5 C), resp. rate 16, height 5\' 6"  (1.676 m), weight 55.3 kg, SpO2 100 %. Body mass index is 19.69 kg/m.   Treatment Plan Summary: Plan no change to medication today.  Treatment team will continue to focus on options for safe discharge later this week  Mordecai Rasmussen, MD 09/08/2022, 1:53 PM

## 2022-09-09 DIAGNOSIS — F250 Schizoaffective disorder, bipolar type: Secondary | ICD-10-CM | POA: Diagnosis not present

## 2022-09-09 NOTE — Progress Notes (Signed)
   09/09/22 0540  Psych Admission Type (Psych Patients Only)  Admission Status Voluntary  Psychosocial Assessment  Patient Complaints None  Eye Contact Brief  Facial Expression Flat  Affect Flat  Speech Loud  Interaction Minimal  Motor Activity Slow  Appearance/Hygiene In scrubs  Behavior Characteristics Cooperative;Guarded  Mood Labile  Thought Process  Coherency Disorganized  Content Delusions  Delusions Paranoid  Perception Hallucinations  Hallucination Auditory;Visual  Judgment Impaired  Confusion Mild  Danger to Self  Current suicidal ideation? Denies  Danger to Others  Danger to Others None reported or observed

## 2022-09-09 NOTE — Progress Notes (Signed)
   09/09/22 2200  Psych Admission Type (Psych Patients Only)  Admission Status Voluntary  Psychosocial Assessment  Patient Complaints None  Eye Contact Brief  Facial Expression Flat  Affect Flat  Speech Loud;Rapid  Interaction Minimal  Motor Activity Slow  Appearance/Hygiene In scrubs  Behavior Characteristics Guarded;Cooperative  Mood Preoccupied  Thought Process  Coherency Disorganized  Content Delusions  Delusions Paranoid  Perception Hallucinations  Hallucination Auditory;Visual  Judgment Limited  Confusion Mild  Danger to Self  Current suicidal ideation? Denies  Danger to Others  Danger to Others None reported or observed

## 2022-09-09 NOTE — Group Note (Signed)
Recreation Therapy Group Note   Group Topic:Stress Management  Group Date: 09/09/2022 Start Time: 1400 End Time: 1500 Facilitators: Rosina Lowenstein, LRT, CTRS Location: Dayroom  Group Description: Stress Charades. Patients and LRT discuss the emotion "stress" and things that are associated with it. LRT encourages pts to make their own list of things that stress them out on a piece of paper provided. LRT encourages pts to identify their top 5 stressors from original list and has pts write them on smaller cut slips of paper. LRT folds and places slips into a container. Pt pulls a random slip out of the container and acts it out. LRT and peers try to guess what the peer is acting out. After all slips of paper are acted out, LRT and pts will discuss different coping skills that help them when they're feeling stressed.   Goal Area(s) Addressed: Patient will identify things that make them feel stressed.  Patient will visualize stressors by acting them out.  Patient will recognize that others experience stress. Patient will become more self-aware of stressors. Patient will discuss and learn alternative coping strategies for handling stress.  Affect/Mood: Appropriate and Flat   Participation Level: Moderate   Participation Quality: Minimal Cues   Behavior: Calm and Cooperative   Speech/Thought Process: Focused   Insight: Fair   Judgement: Fair    Modes of Intervention: Activity   Patient Response to Interventions:  Engaged and Receptive   Education Outcome:  Acknowledges education   Clinical Observations/Individualized Feedback: Betty Henderson was mostly active in their participation of session activities and group discussion. Pt identified "bugs, ants, and rain" as things that stress her out. Pt played multiple rounds of charades with peers with assistance from LRT. Pt interacted well with peers duration of session.   Plan: Continue to engage patient in RT group sessions  2-3x/week.   Rosina Lowenstein, LRT, CTRS 09/09/2022 3:14 PM

## 2022-09-09 NOTE — Progress Notes (Signed)
[facility] MD Progress Note  09/09/2022 4:46 PM Betty Henderson  MRN:  [id number] Subjective: Follow-up patient with schizophrenia.  No change to presentation.  Fairly calm today.  Still confused and disorganized. Principal Problem: Schizoaffective disorder, bipolar type (HCC) Diagnosis: Principal Problem:   Schizoaffective disorder, bipolar type (HCC)  Total Time spent with patient: 30 minutes  Past Psychiatric History: Past history of schizophrenia  Past Medical History:  Past Medical History:  Diagnosis Date   Anemia    Arthritis    Chronic pain    Drug-seeking behavior    Malingering    Osteopetrosis    Psychosis (HCC)    Schizoaffective disorder, bipolar type (HCC)     Past Surgical History:  Procedure Laterality Date   MOUTH SURGERY     TUBAL LIGATION     Family History:  Family History  Family history unknown: Yes   Family Psychiatric  History: See previous Social History:  Social History   Substance and Sexual Activity  Alcohol Use Not Currently   Comment: 1 cocktail 3 weeks ago     Social History   Substance and Sexual Activity  Drug Use Not Currently   Types: Cocaine   Comment: states "it's legal though"    Social History   Socioeconomic History   Marital status: Single    Spouse name: Not on file   Number of children: Not on file   Years of education: Not on file   Highest education level: Not on file  Occupational History   Not on file  Tobacco Use   Smoking status: Every Day    Packs/day: .5    Types: Cigarettes   Smokeless tobacco: Never  Vaping Use   Vaping Use: Never used  Substance and Sexual Activity   Alcohol use: Not Currently    Comment: 1 cocktail 3 weeks ago   Drug use: Not Currently    Types: Cocaine    Comment: states "it's legal though"   Sexual activity: Never  Other Topics Concern   Not on file  Social History Narrative   Not on file   Social Determinants of Health   Financial Resource Strain: Not on file  Food  Insecurity: Food Insecurity Present (08/22/2022)   Hunger Vital Sign    Worried About Running Out of Food in the Last Year: Sometimes true    Ran Out of Food in the Last Year: Sometimes true  Transportation Needs: Patient Unable To Answer (08/22/2022)   PRAPARE - Administrator, Civil Service (Medical): Patient unable to answer    Lack of Transportation (Non-Medical): Patient unable to answer  Physical Activity: Not on file  Stress: Not on file  Social Connections: Not on file   Additional Social History:                         Sleep: Fair  Appetite:  Fair  Current Medications: Current Facility-Administered Medications  Medication Dose Route Frequency Provider Last Rate Last Admin   acetaminophen (TYLENOL) tablet 650 mg  650 mg Oral Q6H PRN [name], DO   650 mg at 08/24/22 0110   alum & mag hydroxide-simeth (MAALOX/MYLANTA) 200-200-20 MG/5ML suspension 30 mL  30 mL Oral Q4H PRN [name], DO       diphenhydrAMINE (BENADRYL) capsule 50 mg  50 mg Oral Q6H PRN [name], DO   50 mg at 09/06/22 2123   Or   diphenhydrAMINE (BENADRYL) injection  50 mg  50 mg Intramuscular Q6H PRN [name], DO       haloperidol (HALDOL) tablet 5 mg  5 mg Oral Q6H PRN [name], DO   5 mg at 09/06/22 2124   Or   haloperidol lactate (HALDOL) injection 5 mg  5 mg Intramuscular Q6H PRN [name], DO       ibuprofen (ADVIL) tablet 600 mg  600 mg Oral Q6H PRN Morene Cecilio, [name], MD       LORazepam (ATIVAN) tablet 1 mg  1 mg Oral TID PRN [name], DO   1 mg at 09/04/22 1627   magnesium hydroxide (MILK OF MAGNESIA) suspension 30 mL  30 mL Oral Daily PRN [name], DO       paliperidone (INVEGA) 24 hr tablet 9 mg  9 mg Oral QHS Danyel Tobey T, MD   9 mg at 09/08/22 2123   traZODone (DESYREL) tablet 50 mg  50 mg Oral QHS PRN [name], DO   50 mg at 09/07/22 2132     Lab Results: No results found for this or any previous visit (from the past 48 hour(s)).  Blood Alcohol level:  Lab Results  Component Value Date   ETH <10 08/17/2022   ETH <10 01/11/2021    Metabolic Disorder Labs: No results found for: "HGBA1C", "MPG" No results found for: "PROLACTIN" No results found for: "CHOL", "TRIG", "HDL", "CHOLHDL", "VLDL", "LDLCALC"  Physical Findings: AIMS:  , ,  ,  ,    CIWA:    COWS:     Musculoskeletal: Strength & Muscle Tone: within normal limits Gait & Station: normal Patient leans: N/A  Psychiatric Specialty Exam:  Presentation  General Appearance:  Casual; Neat  Eye Contact: Fair  Speech: Slow; Slurred  Speech Volume: Decreased  Handedness: Right   Mood and Affect  Mood: Anxious  Affect: Inappropriate   Thought Process  Thought Processes: Disorganized  Descriptions of Associations:Loose  Orientation:Partial  Thought Content:Illogical; Rumination  History of Schizophrenia/Schizoaffective disorder:No data recorded Duration of Psychotic Symptoms:No data recorded Hallucinations:No data recorded Ideas of Reference:Paranoia  Suicidal Thoughts:No data recorded Homicidal Thoughts:No data recorded  Sensorium  Memory: Immediate Fair; Remote Poor  Judgment: Poor  Insight: Poor   Executive Functions  Concentration: Poor  Attention Span: Fair  Recall: [employer] of Knowledge: Fair  Language: Fair   Psychomotor Activity  Psychomotor Activity:No data recorded  Assets  Assets: Communication Skills   Sleep  Sleep:No data recorded   Physical Exam: Physical Exam Vitals and nursing note reviewed.  Constitutional:      Appearance: Normal appearance.  HENT:     Head: Normocephalic and atraumatic.     Mouth/Throat:     Pharynx: Oropharynx is clear.  Eyes:     Pupils: Pupils are equal, round, and reactive to light.  Cardiovascular:     Rate and Rhythm: Normal rate and regular  rhythm.  Pulmonary:     Effort: Pulmonary effort is normal.     Breath sounds: Normal breath sounds.  Abdominal:     General: Abdomen is flat.     Palpations: Abdomen is soft.  Musculoskeletal:        General: Normal range of motion.  Skin:    General: Skin is warm and dry.  Neurological:     General: No focal deficit present.     Mental Status: She is alert. Mental status is at baseline.  Psychiatric:  Attention and Perception: Attention normal.        Mood and Affect: Mood normal.        Speech: Speech is delayed.        Thought Content: Thought content is paranoid.        Cognition and Memory: Cognition normal.    Review of Systems  Constitutional: Negative.   HENT: Negative.    Eyes: Negative.   Respiratory: Negative.    Cardiovascular: Negative.   Gastrointestinal: Negative.   Musculoskeletal: Negative.   Skin: Negative.   Neurological: Negative.   Psychiatric/Behavioral: Negative.     Blood pressure 104/74, pulse 86, temperature 98.2 F (36.8 C), resp. rate 18, height 5\' 6"  (1.676 m), weight 61.5 kg, SpO2 100 %. Body mass index is 21.87 kg/m.   Treatment Plan Summary: Plan no change to medication.  We are basically starting a process of looking into placement because of concern that the patient is unable to care for herself  Mordecai Rasmussen, MD 09/09/2022, 4:46 PM

## 2022-09-09 NOTE — Progress Notes (Signed)
Patient denies SI, HI, and AVH, but is observed responding to internal stimuli. She is calm and cooperative. No physical complaints. She remains safe on the unit at this time.

## 2022-09-10 DIAGNOSIS — F250 Schizoaffective disorder, bipolar type: Secondary | ICD-10-CM | POA: Diagnosis not present

## 2022-09-10 NOTE — Progress Notes (Signed)
[facility] MD Progress Note  09/10/2022 4:57 PM Betty Henderson  MRN:  [id number] Subjective: No complaint no change.  Still psychotic. Principal Problem: Schizoaffective disorder, bipolar type (HCC) Diagnosis: Principal Problem:   Schizoaffective disorder, bipolar type (HCC)  Total Time spent with patient: 30 minutes  Past Psychiatric History: Past history of schizophrenia  Past Medical History:  Past Medical History:  Diagnosis Date   Anemia    Arthritis    Chronic pain    Drug-seeking behavior    Malingering    Osteopetrosis    Psychosis (HCC)    Schizoaffective disorder, bipolar type (HCC)     Past Surgical History:  Procedure Laterality Date   MOUTH SURGERY     TUBAL LIGATION     Family History:  Family History  Family history unknown: Yes   Family Psychiatric  History: See previous Social History:  Social History   Substance and Sexual Activity  Alcohol Use Not Currently   Comment: 1 cocktail 3 weeks ago     Social History   Substance and Sexual Activity  Drug Use Not Currently   Types: Cocaine   Comment: states "it's legal though"    Social History   Socioeconomic History   Marital status: Single    Spouse name: Not on file   Number of children: Not on file   Years of education: Not on file   Highest education level: Not on file  Occupational History   Not on file  Tobacco Use   Smoking status: Every Day    Packs/day: .5    Types: Cigarettes   Smokeless tobacco: Never  Vaping Use   Vaping Use: Never used  Substance and Sexual Activity   Alcohol use: Not Currently    Comment: 1 cocktail 3 weeks ago   Drug use: Not Currently    Types: Cocaine    Comment: states "it's legal though"   Sexual activity: Never  Other Topics Concern   Not on file  Social History Narrative   Not on file   Social Determinants of Health   Financial Resource Strain: Not on file  Food Insecurity: Food Insecurity Present (08/22/2022)   Hunger Vital Sign    Worried  About Running Out of Food in the Last Year: Sometimes true    Ran Out of Food in the Last Year: Sometimes true  Transportation Needs: Patient Unable To Answer (08/22/2022)   PRAPARE - Administrator, Civil Service (Medical): Patient unable to answer    Lack of Transportation (Non-Medical): Patient unable to answer  Physical Activity: Not on file  Stress: Not on file  Social Connections: Not on file   Additional Social History:                         Sleep: Fair  Appetite:  Fair  Current Medications: Current Facility-Administered Medications  Medication Dose Route Frequency Provider Last Rate Last Admin   acetaminophen (TYLENOL) tablet 650 mg  650 mg Oral Q6H PRN [name], DO   650 mg at 09/10/22 1217   alum & mag hydroxide-simeth (MAALOX/MYLANTA) 200-200-20 MG/5ML suspension 30 mL  30 mL Oral Q4H PRN [name], DO       diphenhydrAMINE (BENADRYL) capsule 50 mg  50 mg Oral Q6H PRN [name], DO   50 mg at 09/06/22 2123   Or   diphenhydrAMINE (BENADRYL) injection 50 mg  50 mg Intramuscular Q6H PRN [name],  DO       haloperidol (HALDOL) tablet 5 mg  5 mg Oral Q6H PRN [name], DO   5 mg at 09/06/22 2124   Or   haloperidol lactate (HALDOL) injection 5 mg  5 mg Intramuscular Q6H PRN [name], DO       ibuprofen (ADVIL) tablet 600 mg  600 mg Oral Q6H PRN Kadi Hession, [name], MD       LORazepam (ATIVAN) tablet 1 mg  1 mg Oral TID PRN [name], DO   1 mg at 09/04/22 1627   magnesium hydroxide (MILK OF MAGNESIA) suspension 30 mL  30 mL Oral Daily PRN [name], DO       paliperidone (INVEGA) 24 hr tablet 9 mg  9 mg Oral QHS Lonny Eisen T, MD   9 mg at 09/09/22 2125   traZODone (DESYREL) tablet 50 mg  50 mg Oral QHS PRN [name], DO   50 mg at 09/07/22 2132    Lab Results: No results found for this or any previous visit (from the past 48  hour(s)).  Blood Alcohol level:  Lab Results  Component Value Date   ETH <10 08/17/2022   ETH <10 01/11/2021    Metabolic Disorder Labs: No results found for: "HGBA1C", "MPG" No results found for: "PROLACTIN" No results found for: "CHOL", "TRIG", "HDL", "CHOLHDL", "VLDL", "LDLCALC"  Physical Findings: AIMS:  , ,  ,  ,    CIWA:    COWS:     Musculoskeletal: Strength & Muscle Tone: within normal limits Gait & Station: normal Patient leans: N/A  Psychiatric Specialty Exam:  Presentation  General Appearance:  Casual; Neat  Eye Contact: Fair  Speech: Slow; Slurred  Speech Volume: Decreased  Handedness: Right   Mood and Affect  Mood: Anxious  Affect: Inappropriate   Thought Process  Thought Processes: Disorganized  Descriptions of Associations:Loose  Orientation:Partial  Thought Content:Illogical; Rumination  History of Schizophrenia/Schizoaffective disorder:No data recorded Duration of Psychotic Symptoms:No data recorded Hallucinations:No data recorded Ideas of Reference:Paranoia  Suicidal Thoughts:No data recorded Homicidal Thoughts:No data recorded  Sensorium  Memory: Immediate Fair; Remote Poor  Judgment: Poor  Insight: Poor   Executive Functions  Concentration: Poor  Attention Span: Fair  Recall: [employer] of Knowledge: Fair  Language: Fair   Psychomotor Activity  Psychomotor Activity:No data recorded  Assets  Assets: Communication Skills   Sleep  Sleep:No data recorded   Physical Exam: Physical Exam Vitals reviewed.  Constitutional:      Appearance: Normal appearance.  HENT:     Head: Normocephalic and atraumatic.     Mouth/Throat:     Pharynx: Oropharynx is clear.  Eyes:     Pupils: Pupils are equal, round, and reactive to light.  Cardiovascular:     Rate and Rhythm: Normal rate and regular rhythm.  Pulmonary:     Effort: Pulmonary effort is normal.     Breath sounds: Normal breath sounds.   Abdominal:     General: Abdomen is flat.     Palpations: Abdomen is soft.  Musculoskeletal:        General: Normal range of motion.  Skin:    General: Skin is warm and dry.  Neurological:     General: No focal deficit present.     Mental Status: She is alert. Mental status is at baseline.  Psychiatric:        Mood and Affect: Mood normal.  Thought Content: Thought content normal.    Review of Systems  Constitutional: Negative.   HENT: Negative.    Eyes: Negative.   Respiratory: Negative.    Cardiovascular: Negative.   Gastrointestinal: Negative.   Musculoskeletal: Negative.   Skin: Negative.   Neurological: Negative.   Psychiatric/Behavioral: Negative.     Blood pressure 96/62, pulse 87, temperature 98.6 F (37 C), temperature source Oral, resp. rate 17, height 5\' 6"  (1.676 m), weight 61.5 kg, SpO2 99 %. Body mass index is 21.87 kg/m.   Treatment Plan Summary: Plan no change to treatment plan.  Medications stable.  Injectables doing fine.  Working on possible placement  Mordecai Rasmussen, MD 09/10/2022, 4:57 PM

## 2022-09-10 NOTE — Plan of Care (Signed)
  Problem: Education: Goal: Knowledge of General Education information will improve Description: Including pain rating scale, medication(s)/side effects and non-pharmacologic comfort measures Outcome: Progressing   Problem: Health Behavior/Discharge Planning: Goal: Ability to manage health-related needs will improve Outcome: Progressing   Problem: Clinical Measurements: Goal: Ability to maintain clinical measurements within normal limits will improve Outcome: Progressing Goal: Diagnostic test results will improve Outcome: Progressing   Problem: Nutrition: Goal: Adequate nutrition will be maintained Outcome: Progressing   Problem: Coping: Goal: Level of anxiety will decrease Outcome: Progressing   Problem: Elimination: Goal: Will not experience complications related to bowel motility Outcome: Progressing Goal: Will not experience complications related to urinary retention Outcome: Progressing   Problem: Skin Integrity: Goal: Risk for impaired skin integrity will decrease Outcome: Progressing

## 2022-09-10 NOTE — Group Note (Signed)
Recreation Therapy Group Note   Group Topic:Communication  Group Date: 09/10/2022 Start Time: 1400 End Time: 1445 Facilitators: Rosina Lowenstein, LRT, CTRS Location:  Dayroom  Group Description: Name That Food. Pt will randomly select 1 cut paper from laminated stack of popular food images from the 1970's-1990's from LRT. Pt will use descriptive words to explain what food they selected. Once the food is correctly guessed, LRT and pts will reminisce and discuss past fond memories associated with the food. LRT and pts will go through as many rounds as the cards allow before running out.   Goal Area(s) Addressed: Patient will increase communication skills.  Patient will increase frustration tolerance skills. Patient will reminisce a fond memory in their life.    Affect/Mood: Appropriate   Participation Level: Active and Engaged   Participation Quality: Independent   Behavior: Calm and Cooperative   Speech/Thought Process: Coherent   Insight: Good   Judgement: Fair    Modes of Intervention: Activity   Patient Response to Interventions:  Receptive   Education Outcome:  Acknowledges education   Clinical Observations/Individualized Feedback: Ginnie was active in their participation of session activities and group discussion. Pt was in and out of group a couple times. Pt was able to accurately describe a brand of peanut butter and jelly to the group. Pt interacted well with LRT and peers duration of session.   Plan: Continue to engage patient in RT group sessions 2-3x/week.   Rosina Lowenstein, LRT, CTRS 09/10/2022 3:26 PM

## 2022-09-10 NOTE — [facility] Group Notes (Signed)
[facility] Group Notes:  (Nursing/MHT/Case Management/Adjunct)  Date:  09/10/2022  Time:  10:34 AM  Type of Therapy:   outside rec  Participation Level:  Active  Participation Quality:  Appropriate  Affect:  Appropriate  Cognitive:  Appropriate  Insight:  Appropriate  Engagement in Group:  Engaged  Modes of Intervention:  Activity  Summary of Progress/Problems:  Betty Henderson 09/10/2022, 10:34 AM

## 2022-09-10 NOTE — Progress Notes (Signed)
   09/10/22 0711  Psych Admission Type (Psych Patients Only)  Admission Status Voluntary  Psychosocial Assessment  Patient Complaints None  Eye Contact Poor  Facial Expression Flat;Blank  Affect Flat  Speech Loud  Interaction Assertive  Motor Activity Slow  Appearance/Hygiene In scrubs  Behavior Characteristics Cooperative;Appropriate to situation  Mood Pleasant  Thought Process  Coherency Flight of ideas;Disorganized  Content WDL  Delusions None reported or observed  Perception WDL  Hallucination Auditory;Visual  Judgment Impaired  Confusion Mild  Danger to Self  Current suicidal ideation? Denies  Danger to Others  Danger to Others None reported or observed

## 2022-09-11 NOTE — Progress Notes (Signed)
   09/11/22 2200  Psych Admission Type (Psych Patients Only)  Admission Status Voluntary  Psychosocial Assessment  Patient Complaints None  Eye Contact Poor  Facial Expression Flat  Affect Labile  Speech Loud;Slurred  Interaction Assertive  Motor Activity Slow  Appearance/Hygiene In scrubs  Behavior Characteristics Combative  Mood Sullen  Thought Process  Coherency Flight of ideas;Disorganized  Content WDL  Delusions None reported or observed  Perception WDL  Hallucination Auditory;Visual  Judgment Impaired  Confusion Mild  Danger to Self  Current suicidal ideation? Denies  Danger to Others  Danger to Others None reported or observed

## 2022-09-11 NOTE — Progress Notes (Signed)
   09/11/22 1000  Psych Admission Type (Psych Patients Only)  Admission Status Voluntary  Psychosocial Assessment  Patient Complaints None  Eye Contact Poor  Facial Expression Flat;Blank  Affect Flat;Sad  Speech Loud;Slurred  Interaction Assertive  Motor Activity Slow  Appearance/Hygiene In scrubs  Behavior Characteristics Combative  Mood Sullen;Pleasant  Thought Process  Coherency Flight of ideas;Disorganized  Content WDL  Delusions None reported or observed  Perception WDL  Hallucination Auditory;Visual  Judgment Impaired  Confusion Mild  Danger to Self  Current suicidal ideation? Denies  Danger to Others  Danger to Others None reported or observed

## 2022-09-11 NOTE — [facility] Group Notes (Signed)
[facility] Group Notes:  (Nursing/MHT/Case Management/Adjunct)  Date:  09/11/2022  Time:  10:58 AM  Type of Therapy Outside Rec/ Discussion   Participation Level:  Active  Participation Quality:  Appropriate  Affect:  Appropriate  Cognitive:  Appropriate and Confused  Insight:  Appropriate  Engagement in Group:  Improving and Limited  Modes of Intervention:  Activity and Discussion  Summary of Progress/Problems:  [name] 09/11/2022, 10:58 AM

## 2022-09-11 NOTE — Progress Notes (Signed)
[facility] MD Progress Note  09/11/2022 5:35 PM Betty Henderson  MRN:  [id number] Subjective: No complaint no change.  Still psychotic. Principal Problem: Schizoaffective disorder, bipolar type (HCC) Diagnosis: Principal Problem:   Schizoaffective disorder, bipolar type (HCC)  Total Time spent with patient: 30 minutes  Past Psychiatric History: Past history of schizophrenia  Past Medical History:  Past Medical History:  Diagnosis Date   Anemia    Arthritis    Chronic pain    Drug-seeking behavior    Malingering    Osteopetrosis    Psychosis (HCC)    Schizoaffective disorder, bipolar type (HCC)     Past Surgical History:  Procedure Laterality Date   MOUTH SURGERY     TUBAL LIGATION     Family History:  Family History  Family history unknown: Yes   Family Psychiatric  History: See previous Social History:  Social History   Substance and Sexual Activity  Alcohol Use Not Currently   Comment: 1 cocktail 3 weeks ago     Social History   Substance and Sexual Activity  Drug Use Not Currently   Types: Cocaine   Comment: states "it's legal though"    Social History   Socioeconomic History   Marital status: Single    Spouse name: Not on file   Number of children: Not on file   Years of education: Not on file   Highest education level: Not on file  Occupational History   Not on file  Tobacco Use   Smoking status: Every Day    Packs/day: .5    Types: Cigarettes   Smokeless tobacco: Never  Vaping Use   Vaping Use: Never used  Substance and Sexual Activity   Alcohol use: Not Currently    Comment: 1 cocktail 3 weeks ago   Drug use: Not Currently    Types: Cocaine    Comment: states "it's legal though"   Sexual activity: Never  Other Topics Concern   Not on file  Social History Narrative   Not on file   Social Determinants of Health   Financial Resource Strain: Not on file  Food Insecurity: Food Insecurity Present (08/22/2022)   Hunger Vital Sign    Worried  About Running Out of Food in the Last Year: Sometimes true    Ran Out of Food in the Last Year: Sometimes true  Transportation Needs: Patient Unable To Answer (08/22/2022)   PRAPARE - Administrator, Civil Service (Medical): Patient unable to answer    Lack of Transportation (Non-Medical): Patient unable to answer  Physical Activity: Not on file  Stress: Not on file  Social Connections: Not on file   Additional Social History:                         Sleep: Fair  Appetite:  Fair  Current Medications: Current Facility-Administered Medications  Medication Dose Route Frequency Provider Last Rate Last Admin   acetaminophen (TYLENOL) tablet 650 mg  650 mg Oral Q6H PRN [name], DO   650 mg at 09/10/22 2126   alum & mag hydroxide-simeth (MAALOX/MYLANTA) 200-200-20 MG/5ML suspension 30 mL  30 mL Oral Q4H PRN [name], DO   30 mL at 09/10/22 2125   diphenhydrAMINE (BENADRYL) capsule 50 mg  50 mg Oral Q6H PRN [name], DO   50 mg at 09/11/22 1314   Or   diphenhydrAMINE (BENADRYL) injection 50 mg  50 mg Intramuscular Q6H PRN  [name], DO       haloperidol (HALDOL) tablet 5 mg  5 mg Oral Q6H PRN [name], DO   5 mg at 09/11/22 1314   Or   haloperidol lactate (HALDOL) injection 5 mg  5 mg Intramuscular Q6H PRN [name], DO       ibuprofen (ADVIL) tablet 600 mg  600 mg Oral Q6H PRN Commodore Bellew, [name], MD       LORazepam (ATIVAN) tablet 1 mg  1 mg Oral TID PRN [name], DO   1 mg at 09/11/22 1314   magnesium hydroxide (MILK OF MAGNESIA) suspension 30 mL  30 mL Oral Daily PRN [name], DO       paliperidone (INVEGA) 24 hr tablet 9 mg  9 mg Oral QHS Hodari Chuba T, MD   9 mg at 09/10/22 2126   traZODone (DESYREL) tablet 50 mg  50 mg Oral QHS PRN [name], DO   50 mg at 09/10/22 2126    Lab Results: No results found for this or any previous visit  (from the past 48 hour(s)).  Blood Alcohol level:  Lab Results  Component Value Date   ETH <10 08/17/2022   ETH <10 01/11/2021    Metabolic Disorder Labs: No results found for: "HGBA1C", "MPG" No results found for: "PROLACTIN" No results found for: "CHOL", "TRIG", "HDL", "CHOLHDL", "VLDL", "LDLCALC"  Physical Findings: AIMS:  , ,  ,  ,    CIWA:    COWS:     Musculoskeletal: Strength & Muscle Tone: within normal limits Gait & Station: normal Patient leans: N/A  Psychiatric Specialty Exam:  Presentation  General Appearance:  Casual; Neat  Eye Contact: Fair  Speech: Slow; Slurred  Speech Volume: Decreased  Handedness: Right   Mood and Affect  Mood: Anxious  Affect: Inappropriate   Thought Process  Thought Processes: Disorganized  Descriptions of Associations:Loose  Orientation:Partial  Thought Content:Illogical; Rumination  History of Schizophrenia/Schizoaffective disorder:No data recorded Duration of Psychotic Symptoms:No data recorded Hallucinations:No data recorded Ideas of Reference:Paranoia  Suicidal Thoughts:No data recorded Homicidal Thoughts:No data recorded  Sensorium  Memory: Immediate Fair; Remote Poor  Judgment: Poor  Insight: Poor   Executive Functions  Concentration: Poor  Attention Span: Fair  Recall: [employer] of Knowledge: Fair  Language: Fair   Psychomotor Activity  Psychomotor Activity:No data recorded  Assets  Assets: Communication Skills   Sleep  Sleep:No data recorded   Physical Exam: Physical Exam Vitals reviewed.  Constitutional:      Appearance: Normal appearance.  HENT:     Head: Normocephalic and atraumatic.     Mouth/Throat:     Pharynx: Oropharynx is clear.  Eyes:     Pupils: Pupils are equal, round, and reactive to light.  Cardiovascular:     Rate and Rhythm: Normal rate and regular rhythm.  Pulmonary:     Effort: Pulmonary effort is normal.     Breath sounds: Normal  breath sounds.  Abdominal:     General: Abdomen is flat.     Palpations: Abdomen is soft.  Musculoskeletal:        General: Normal range of motion.  Skin:    General: Skin is warm and dry.  Neurological:     General: No focal deficit present.     Mental Status: She is alert. Mental status is at baseline.  Psychiatric:        Mood and Affect: Mood normal.  Thought Content: Thought content normal.    Review of Systems  Constitutional: Negative.   HENT: Negative.    Eyes: Negative.   Respiratory: Negative.    Cardiovascular: Negative.   Gastrointestinal: Negative.   Musculoskeletal: Negative.   Skin: Negative.   Neurological: Negative.   Psychiatric/Behavioral: Negative.     Blood pressure 117/64, pulse 79, temperature 98 F (36.7 C), resp. rate 14, height 5\' 6"  (1.676 m), weight 61.5 kg, SpO2 100 %. Body mass index is 21.87 kg/m.   Treatment Plan Summary: Plan no change to treatment plan.  Medications stable.  Injectables doing fine.  Working on possible placement  Mordecai Rasmussen, MD 09/11/2022, 5:35 PM

## 2022-09-11 NOTE — Group Note (Signed)
Recreation Therapy Group Note   Group Topic:Relaxation  Group Date: 09/11/2022 Start Time: 1400 End Time: 1445 Facilitators: Rosina Lowenstein, LRT, CTRS Location:  Dayroom  Group Description: PMR (Progressive Muscle Relaxation). LRT asks patients their current level of stress/anxiety from 1-10, with 10 being the highest. LRT educates patients on what PMR is and the benefits that come from it. Patients are asked to sit with their feet flat on the floor while sitting up and all the way back in their chair, if possible. LRT and pts follow a prompt through a speaker that requires you to tense and release different muscles in their body and focus on their breathing. During session, lights are off and soft music is being played. At the end of the prompt, LRT asks patients to rank their current levels of stress/anxiety from 1-10, 10 being the highest.   Goal Area(s) Addressed:  Patients will be able to describe progressive muscle relaxation.  Patient will practice using relaxation technique. Patient will identify a new coping skill.  Patient will follow multistep directions to reduce anxiety and stress.  Affect/Mood: Blunted   Participation Level: Moderate   Participation Quality: Minimal Cues   Behavior: Alert and Restless   Speech/Thought Process: Disorganized   Insight: Fair   Judgement: Limited   Modes of Intervention: Activity   Patient Response to Interventions:  Receptive   Education Outcome:  In group clarification offered    Clinical Observations/Individualized Feedback: Belina was mostly active in their participation of session activities and group discussion. Pt identified that her stress and anxiety were a 10 before he session. After, she shared that her stress and anxiety were a 1. Pt was mumbling to herself during group and needed cues to be quiet.    Plan: Continue to engage patient in RT group sessions 2-3x/week.   Rosina Lowenstein, LRT, CTRS 09/11/2022 3:16 PM

## 2022-09-11 NOTE — Plan of Care (Signed)
  Problem: Education: Goal: Knowledge of General Education information will improve Description: Including pain rating scale, medication(s)/side effects and non-pharmacologic comfort measures Outcome: Progressing   Problem: Health Behavior/Discharge Planning: Goal: Ability to manage health-related needs will improve Outcome: Progressing   Problem: Clinical Measurements: Goal: Ability to maintain clinical measurements within normal limits will improve Outcome: Progressing Goal: Diagnostic test results will improve Outcome: Progressing   Problem: Nutrition: Goal: Adequate nutrition will be maintained Outcome: Progressing   Problem: Coping: Goal: Level of anxiety will decrease Outcome: Progressing   Problem: Elimination: Goal: Will not experience complications related to bowel motility Outcome: Progressing Goal: Will not experience complications related to urinary retention Outcome: Progressing   

## 2022-09-11 NOTE — Progress Notes (Signed)
D: Patient alert and oriented times 3. Pt denies SI, HI, AVH. She denies anxiety and depression. Patient had an outburst earlier and was given PRN's with good effect. No other concerns noted at this time.   A: Pt provided support and encouragement throughout the day. Scheduled medications given as prescribed. Takes medications whole without issue.    R: Pt remains safe on the unit with Q15 min safety checks in place. Will continue to monitor for changes.

## 2022-09-12 DIAGNOSIS — F250 Schizoaffective disorder, bipolar type: Secondary | ICD-10-CM | POA: Diagnosis not present

## 2022-09-12 NOTE — Progress Notes (Signed)
Patient was heard from room mumbling and yelling loud. Medications given and was effective. Patient now resting in bed quietly, no behaviors or responding to internal stimuli. Routine safety checks conducted according to facility protocol. Will continue to monitor for safety.

## 2022-09-12 NOTE — Group Note (Signed)
Recreation Therapy Group Note   Group Topic:Stress Management  Group Date: 09/12/2022 Start Time: 1400 End Time: 1500 Facilitators: Rosina Lowenstein, LRT, CTRS Location:  Dayroom  Group description: Minute To Win It. LRT and pts played 3 minute to win it games. The first one being that they had to list out loud as many presidents as they could, "name that tune" where they listened to a small clip of a song and had to guess the name of it, and lastly cup stack. For cup stack, each pt is given 6 foam cups. They are instructed to make 1 pyramid while using all 6 cups using both hands. Second round, they only use their right hand to make a pyramid and the third round, they only use their left hand. LRT and pts discussed the physical and mental symptoms of being under stress or under pressure. LRT and pts discussed how this can be used post discharge.   Goal Area(s) Addressed: Patient will identify physical symptoms of stress. Patient will identify emotional symptoms of stress. Patient will build frustration tolerance skills.   Affect/Mood: N/A   Participation Level: Non-verbal    Clinical Observations/Individualized Feedback: Betty Henderson came into the dayroom and sat on the couch. Minutes later pt was heard snoring.   Plan: Continue to engage patient in RT group sessions 2-3x/week.   Rosina Lowenstein, LRT, CTRS 09/12/2022 4:07 PM

## 2022-09-12 NOTE — Progress Notes (Signed)
[facility] MD Progress Note  09/12/2022 3:32 PM Rolene Gores  MRN:  [id number] Subjective: Patient seen and chart reviewed.  62 year old with schizoaffective disorder.  No new complaints.  When I need close walking around the unit interacting reasonably well.  No new behavior problems.  In conversation still disorganized with poor understanding of her situation. Principal Problem: Schizoaffective disorder, bipolar type (HCC) Diagnosis: Principal Problem:   Schizoaffective disorder, bipolar type (HCC)  Total Time spent with patient: 30 minutes  Past Psychiatric History: Past history of schizoaffective disorder  Past Medical History:  Past Medical History:  Diagnosis Date   Anemia    Arthritis    Chronic pain    Drug-seeking behavior    Malingering    Osteopetrosis    Psychosis (HCC)    Schizoaffective disorder, bipolar type (HCC)     Past Surgical History:  Procedure Laterality Date   MOUTH SURGERY     TUBAL LIGATION     Family History:  Family History  Family history unknown: Yes   Family Psychiatric  History: See previous Social History:  Social History   Substance and Sexual Activity  Alcohol Use Not Currently   Comment: 1 cocktail 3 weeks ago     Social History   Substance and Sexual Activity  Drug Use Not Currently   Types: Cocaine   Comment: states "it's legal though"    Social History   Socioeconomic History   Marital status: Single    Spouse name: Not on file   Number of children: Not on file   Years of education: Not on file   Highest education level: Not on file  Occupational History   Not on file  Tobacco Use   Smoking status: Every Day    Packs/day: .5    Types: Cigarettes   Smokeless tobacco: Never  Vaping Use   Vaping Use: Never used  Substance and Sexual Activity   Alcohol use: Not Currently    Comment: 1 cocktail 3 weeks ago   Drug use: Not Currently    Types: Cocaine    Comment: states "it's legal though"   Sexual activity: Never   Other Topics Concern   Not on file  Social History Narrative   Not on file   Social Determinants of Health   Financial Resource Strain: Not on file  Food Insecurity: Food Insecurity Present (08/22/2022)   Hunger Vital Sign    Worried About Running Out of Food in the Last Year: Sometimes true    Ran Out of Food in the Last Year: Sometimes true  Transportation Needs: Patient Unable To Answer (08/22/2022)   PRAPARE - Administrator, Civil Service (Medical): Patient unable to answer    Lack of Transportation (Non-Medical): Patient unable to answer  Physical Activity: Not on file  Stress: Not on file  Social Connections: Not on file   Additional Social History:                         Sleep: Fair  Appetite:  Fair  Current Medications: Current Facility-Administered Medications  Medication Dose Route Frequency Provider Last Rate Last Admin   acetaminophen (TYLENOL) tablet 650 mg  650 mg Oral Q6H PRN [name], DO   650 mg at 09/10/22 2126   alum & mag hydroxide-simeth (MAALOX/MYLANTA) 200-200-20 MG/5ML suspension 30 mL  30 mL Oral Q4H PRN [name], DO   30 mL at 09/10/22 2125   diphenhydrAMINE (BENADRYL)  capsule 50 mg  50 mg Oral Q6H PRN [name], DO   50 mg at 09/12/22 1610   Or   diphenhydrAMINE (BENADRYL) injection 50 mg  50 mg Intramuscular Q6H PRN [name], DO       haloperidol (HALDOL) tablet 5 mg  5 mg Oral Q6H PRN [name], DO   5 mg at 09/12/22 9604   Or   haloperidol lactate (HALDOL) injection 5 mg  5 mg Intramuscular Q6H PRN [name], DO       ibuprofen (ADVIL) tablet 600 mg  600 mg Oral Q6H PRN Gissel Keilman T, MD   600 mg at 09/12/22 0117   LORazepam (ATIVAN) tablet 1 mg  1 mg Oral TID PRN [name], DO   1 mg at 09/12/22 5409   magnesium hydroxide (MILK OF MAGNESIA) suspension 30 mL  30 mL Oral Daily PRN [name], DO        paliperidone (INVEGA) 24 hr tablet 9 mg  9 mg Oral QHS Tamikia Chowning, [name], MD   9 mg at 09/11/22 2145   traZODone (DESYREL) tablet 50 mg  50 mg Oral QHS PRN [name], DO   50 mg at 09/11/22 2145    Lab Results: No results found for this or any previous visit (from the past 48 hour(s)).  Blood Alcohol level:  Lab Results  Component Value Date   ETH <10 08/17/2022   ETH <10 01/11/2021    Metabolic Disorder Labs: No results found for: "HGBA1C", "MPG" No results found for: "PROLACTIN" No results found for: "CHOL", "TRIG", "HDL", "CHOLHDL", "VLDL", "LDLCALC"  Physical Findings: AIMS:  , ,  ,  ,    CIWA:    COWS:     Musculoskeletal: Strength & Muscle Tone: within normal limits Gait & Station: normal Patient leans: N/A  Psychiatric Specialty Exam:  Presentation  General Appearance:  Casual; Neat  Eye Contact: Fair  Speech: Slow; Slurred  Speech Volume: Decreased  Handedness: Right   Mood and Affect  Mood: Anxious  Affect: Inappropriate   Thought Process  Thought Processes: Disorganized  Descriptions of Associations:Loose  Orientation:Partial  Thought Content:Illogical; Rumination  History of Schizophrenia/Schizoaffective disorder:No data recorded Duration of Psychotic Symptoms:No data recorded Hallucinations:No data recorded Ideas of Reference:Paranoia  Suicidal Thoughts:No data recorded Homicidal Thoughts:No data recorded  Sensorium  Memory: Immediate Fair; Remote Poor  Judgment: Poor  Insight: Poor   Executive Functions  Concentration: Poor  Attention Span: Fair  Recall: [employer] of Knowledge: Fair  Language: Fair   Psychomotor Activity  Psychomotor Activity:No data recorded  Assets  Assets: Communication Skills   Sleep  Sleep:No data recorded   Physical Exam: Physical Exam Vitals and nursing note reviewed.  Constitutional:      Appearance: Normal appearance.  HENT:     Head: Normocephalic  and atraumatic.     Mouth/Throat:     Pharynx: Oropharynx is clear.  Eyes:     Pupils: Pupils are equal, round, and reactive to light.  Cardiovascular:     Rate and Rhythm: Normal rate and regular rhythm.  Pulmonary:     Effort: Pulmonary effort is normal.     Breath sounds: Normal breath sounds.  Abdominal:     General: Abdomen is flat.     Palpations: Abdomen is soft.  Musculoskeletal:        General: Normal range of motion.  Skin:    General: Skin is warm and dry.  Neurological:     General: No focal deficit present.     Mental Status: She is alert. Mental status is at baseline.  Psychiatric:        Mood and Affect: Mood normal.        Thought Content: Thought content normal.    Review of Systems  Constitutional: Negative.   HENT: Negative.    Eyes: Negative.   Respiratory: Negative.    Cardiovascular: Negative.   Gastrointestinal: Negative.   Musculoskeletal: Negative.   Skin: Negative.   Neurological: Negative.   Psychiatric/Behavioral: Negative.     Blood pressure 111/75, pulse 76, temperature 98.1 F (36.7 C), resp. rate 16, height 5\' 6"  (1.676 m), weight 61.5 kg, SpO2 100 %. Body mass index is 21.87 kg/m.   Treatment Plan Summary: Medication management and Plan no change to medication or treatment plan.  Social work working on some kind of placement  Mordecai Rasmussen, MD 09/12/2022, 3:32 PM

## 2022-09-12 NOTE — Progress Notes (Signed)
D: Patient alert and oriented times 3. Pt denies SI, HI, AVH. She denies anxiety and depression. Patient has been pleasant today. No other concerns noted at this time.   A: Pt provided support and encouragement throughout the day. Scheduled medications given as prescribed. Takes medications whole without issue.    R: Pt remains safe on the unit with Q15 min safety checks in place. Will continue to monitor for changes.

## 2022-09-12 NOTE — Plan of Care (Signed)
  Problem: Education: Goal: Knowledge of General Education information will improve Description: Including pain rating scale, medication(s)/side effects and non-pharmacologic comfort measures Outcome: Progressing   Problem: Health Behavior/Discharge Planning: Goal: Ability to manage health-related needs will improve Outcome: Progressing   Problem: Clinical Measurements: Goal: Ability to maintain clinical measurements within normal limits will improve Outcome: Progressing Goal: Diagnostic test results will improve Outcome: Progressing   Problem: Nutrition: Goal: Adequate nutrition will be maintained Outcome: Progressing   Problem: Coping: Goal: Level of anxiety will decrease Outcome: Progressing   Problem: Elimination: Goal: Will not experience complications related to bowel motility Outcome: Progressing Goal: Will not experience complications related to urinary retention Outcome: Progressing   

## 2022-09-12 NOTE — Group Note (Unsigned)
Date:  09/12/2022 Time:  2:50 PM  Group Topic/Focus:  movement     Participation Level:  {[facility] PARTICIPATION LEVEL:22264}  Participation Quality:  {[facility] PARTICIPATION QUALITY:22265}  Affect:  {[facility] AFFECT:22266}  Cognitive:  {[facility] COGNITIVE:22267}  Insight: {[facility] Insight2:20797}  Engagement in Group:  {[facility] ENGAGEMENT IN GROUP:22268}  Modes of Intervention:  {[facility] MODES OF INTERVENTION:22269}  Additional Comments:  ***  Betty Henderson 09/12/2022, 2:50 PM  

## 2022-09-12 NOTE — [facility] Group Notes (Signed)
[facility] Group Notes:  (Nursing/MHT/Case Management/Adjunct)  Date:  09/12/2022  Time:  10:05 AM  Type of Therapy:  Movement Therapy  Participation Level:  Active  Participation Quality:  Appropriate  Affect:  Appropriate  Cognitive:  Appropriate  Insight:  Appropriate  Engagement in Group:  Engaged  Modes of Intervention:  Activity  Summary of Progress/Problems:  Rodena Goldmann 09/12/2022, 10:05 AM

## 2022-09-12 NOTE — BH IP Treatment Plan (Signed)
Interdisciplinary Treatment and Diagnostic Plan Update  09/12/2022 Time of Session: 8:30 AM  Betty Henderson MRN: [id number]  Principal Diagnosis: Schizoaffective disorder, bipolar type (HCC)  Secondary Diagnoses: Principal Problem:   Schizoaffective disorder, bipolar type (HCC)   Current Medications:  Current Facility-Administered Medications  Medication Dose Route Frequency Provider Last Rate Last Admin   acetaminophen (TYLENOL) tablet 650 mg  650 mg Oral Q6H PRN [name], DO   650 mg at 09/10/22 2126   alum & mag hydroxide-simeth (MAALOX/MYLANTA) 200-200-20 MG/5ML suspension 30 mL  30 mL Oral Q4H PRN [name], DO   30 mL at 09/10/22 2125   diphenhydrAMINE (BENADRYL) capsule 50 mg  50 mg Oral Q6H PRN [name], DO   50 mg at 09/12/22 4098   Or   diphenhydrAMINE (BENADRYL) injection 50 mg  50 mg Intramuscular Q6H PRN [name], DO       haloperidol (HALDOL) tablet 5 mg  5 mg Oral Q6H PRN [name], DO   5 mg at 09/12/22 1191   Or   haloperidol lactate (HALDOL) injection 5 mg  5 mg Intramuscular Q6H PRN [name], DO       ibuprofen (ADVIL) tablet 600 mg  600 mg Oral Q6H PRN [name], [name], MD   600 mg at 09/12/22 0117   LORazepam (ATIVAN) tablet 1 mg  1 mg Oral TID PRN [name], DO   1 mg at 09/12/22 0509   magnesium hydroxide (MILK OF MAGNESIA) suspension 30 mL  30 mL Oral Daily PRN [name], DO       paliperidone (INVEGA) 24 hr tablet 9 mg  9 mg Oral QHS [name], [name], MD   9 mg at 09/11/22 2145   traZODone (DESYREL) tablet 50 mg  50 mg Oral QHS PRN [name], DO   50 mg at 09/11/22 2145   PTA Medications: Medications Prior to Admission  Medication Sig Dispense Refill Last Dose   ondansetron (ZOFRAN) 4 MG tablet Take 1 tablet (4 mg total) by mouth every 8 (eight) hours as needed for nausea or vomiting. (Patient not taking: Reported on 08/17/2022) 20  tablet 0     Patient Stressors: Financial difficulties   Health problems   Medication change or noncompliance    Patient Strengths: Ability for insight   Treatment Modalities: Medication Management, Group therapy, Case management,  1 to 1 session with clinician, Psychoeducation, Recreational therapy.   Physician Treatment Plan for Primary Diagnosis: Schizoaffective disorder, bipolar type (HCC) Long Term Goal(s): Improvement in symptoms so as ready for discharge   Short Term Goals: Ability to identify changes in lifestyle to reduce recurrence of condition will improve Ability to verbalize feelings will improve Ability to disclose and discuss suicidal ideas Ability to demonstrate self-control will improve Ability to identify and develop effective coping behaviors will improve Ability to maintain clinical measurements within normal limits will improve Compliance with prescribed medications will improve Ability to identify triggers associated with substance abuse/mental health issues will improve  Medication Management: Evaluate patient's response, side effects, and tolerance of medication regimen.  Therapeutic Interventions: 1 to 1 sessions, Unit Group sessions and Medication administration.  Evaluation of Outcomes: Not Progressing  Physician Treatment Plan for Secondary Diagnosis: Principal Problem:   Schizoaffective disorder, bipolar type (HCC)  Long Term Goal(s): Improvement in symptoms so as ready for discharge   Short Term Goals: Ability to identify changes in lifestyle to reduce recurrence of condition will  improve Ability to verbalize feelings will improve Ability to disclose and discuss suicidal ideas Ability to demonstrate self-control will improve Ability to identify and develop effective coping behaviors will improve Ability to maintain clinical measurements within normal limits will improve Compliance with prescribed medications will improve Ability to identify  triggers associated with substance abuse/mental health issues will improve     Medication Management: Evaluate patient's response, side effects, and tolerance of medication regimen.  Therapeutic Interventions: 1 to 1 sessions, Unit Group sessions and Medication administration.  Evaluation of Outcomes: Not Progressing   RN Treatment Plan for Primary Diagnosis: Schizoaffective disorder, bipolar type (HCC) Long Term Goal(s): Knowledge of disease and therapeutic regimen to maintain health will improve  Short Term Goals: Ability to remain free from injury will improve, Ability to verbalize frustration and anger appropriately will improve, Ability to demonstrate self-control, Ability to participate in decision making will improve, Ability to verbalize feelings will improve, Ability to disclose and discuss suicidal ideas, Ability to identify and develop effective coping behaviors will improve, and Compliance with prescribed medications will improve  Medication Management: RN will administer medications as ordered by provider, will assess and evaluate patient's response and provide education to patient for prescribed medication. RN will report any adverse and/or side effects to prescribing provider.  Therapeutic Interventions: 1 on 1 counseling sessions, Psychoeducation, Medication administration, Evaluate responses to treatment, Monitor vital signs and CBGs as ordered, Perform/monitor CIWA, COWS, AIMS and Fall Risk screenings as ordered, Perform wound care treatments as ordered.  Evaluation of Outcomes: Not Progressing   LCSW Treatment Plan for Primary Diagnosis: Schizoaffective disorder, bipolar type (HCC) Long Term Goal(s): Safe transition to appropriate next level of care at discharge, Engage patient in therapeutic group addressing interpersonal concerns.  Short Term Goals: Engage patient in aftercare planning with referrals and resources, Increase social support, Increase ability to  appropriately verbalize feelings, Increase emotional regulation, Facilitate acceptance of mental health diagnosis and concerns, Facilitate patient progression through stages of change regarding substance use diagnoses and concerns, Identify triggers associated with mental health/substance abuse issues, and Increase skills for wellness and recovery  Therapeutic Interventions: Assess for all discharge needs, 1 to 1 time with Social worker, Explore available resources and support systems, Assess for adequacy in community support network, Educate family and significant other(s) on suicide prevention, Complete Psychosocial Assessment, Interpersonal group therapy.  Evaluation of Outcomes: Not Progressing   Progress in Treatment: Attending groups: Yes. Participating in groups: Yes. Taking medication as prescribed: Yes. Toleration medication: Yes. Family/Significant other contact made: No, will contact:  Pt declined  Patient understands diagnosis: Yes. Discussing patient identified problems/goals with staff: Yes. Medical problems stabilized or resolved: Yes. Denies suicidal/homicidal ideation: Yes. Issues/concerns per patient self-inventory: No. Other: None   ew problem(s) identified: No, Describe:  none   New Short Term/Long Term Goal(s): detox, elimination of symptoms of psychosis, medication management for mood stabilization; elimination of SI thoughts; development of comprehensive mental wellness/sobriety plan.  Update 08/28/2022:  No changes at this time.  Update 09/02/2022: No changes at this time. Update 09/07/22: There are no changes at this time. Update 09/12/22 No changes at this time    Patient Goals:  Patient was present in group. Patient too disorganized to provide a goal at this time. Update 08/28/2022:  No changes at this time. Update 09/02/2022: No changes at this time. Update 09/07/22: There are no changes at this time. Update 09/12/22 No changes at this time      Discharge Plan or  Barriers: CSW  to assist patient in development of appropriate discharge plans.  Update 08/28/2022:  Patient remains psychotic at this time.  Progress has been limited.  Unable to identify placement for patient.  Discussions are being had on contacting DSS to make possible APS report for guardianship for patient. Update 09/02/2022: CSW spoke with the patients son who reports that patient is at baseline.  He reports that this has been his mothers disposition for 20 years.  He reports that she does not have any supports and is currently homeless.  CSW to continue to assist in development of a safe discharge plan. Update 09/02/2022: The patient doesn't have the supports that she can stay with or provide any real support to her. An email was sent about making an APS report due to safety concerns and possible need for a guardian. Update 09/12/22: Pt remains psychotic at this time. No place to go, no supports, pending status on APS report   Reason for Continuation of Hospitalization: Delusions  Depression Medication stabilization   Estimated Length of Stay:  Update 08/28/2022:  No changes at this time. Update 09/02/2022: TBD.  Update 09/02/2022: TBD Update 09/12/22 TBD  Last 3 [place] Suicide Severity Risk Score: Flowsheet Row Admission (Current) from 08/22/2022 in [facility]ICINE ED from 08/17/2022 in [facility] Emergency Department at [facility] ED from 08/12/2022 in [facility] at [facility]  C-SSRS RISK CATEGORY Low Risk No Risk No Risk       Last [facility] 2/9 Scores:     No data to display          Scribe for Treatment Team: Elza Rafter, Theresia Majors 09/12/2022 9:59 AM

## 2022-09-12 NOTE — Progress Notes (Signed)
   09/12/22 0711  Psych Admission Type (Psych Patients Only)  Admission Status Voluntary  Psychosocial Assessment  Patient Complaints None  Eye Contact Poor  Facial Expression Flat;Blank  Affect Flat;Sad  Speech Loud;Slurred  Interaction Assertive  Motor Activity Slow  Appearance/Hygiene In scrubs  Thought Process  Coherency Flight of ideas;Disorganized  Content WDL  Delusions None reported or observed  Perception WDL  Hallucination Auditory;Visual  Judgment Impaired  Confusion Mild  Danger to Self  Current suicidal ideation? Denies  Danger to Others  Danger to Others None reported or observed

## 2022-09-13 DIAGNOSIS — F250 Schizoaffective disorder, bipolar type: Secondary | ICD-10-CM | POA: Diagnosis not present

## 2022-09-13 NOTE — Group Note (Signed)
LCSW Group Therapy Note  Group Date: 09/13/2022 Start Time: 1315 End Time: 1400   Type of Therapy and Topic:  Group Therapy - How To Cope with Nervousness about Discharge   Participation Level:  Did Not Attend   Description of Group This process group involved identification of patients' feelings about discharge. Some of them are scheduled to be discharged soon, while others are new admissions, but each of them was asked to share thoughts and feelings surrounding discharge from the hospital. One common theme was that they are excited at the prospect of going home, while another was that many of them are apprehensive about sharing why they were hospitalized. Patients were given the opportunity to discuss these feelings with their peers in preparation for discharge.  Therapeutic Goals  Patient will identify their overall feelings about pending discharge. Patient will think about how they might proactively address issues that they believe will once again arise once they get home (i.e. with parents). Patients will participate in discussion about having hope for change.   Summary of Patient Progress:   X  Therapeutic Modalities Cognitive Behavioral Therapy   Claudie Fisherman 09/13/2022  3:25 PM

## 2022-09-13 NOTE — Progress Notes (Signed)
   09/13/22 0800  Psych Admission Type (Psych Patients Only)  Admission Status Voluntary  Psychosocial Assessment  Patient Complaints None  Eye Contact Poor  Facial Expression Blank  Affect Flat  Speech Slurred  Interaction Assertive  Motor Activity Slow  Appearance/Hygiene In scrubs  Behavior Characteristics Anxious  Mood Preoccupied  Thought Process  Coherency Disorganized  Content WDL  Delusions None reported or observed  Perception WDL  Hallucination Auditory;Visual  Judgment Impaired  Confusion Mild  Danger to Self  Current suicidal ideation? Denies  Danger to Others  Danger to Others None reported or observed

## 2022-09-13 NOTE — Plan of Care (Signed)
  Problem: Coping: Goal: Level of anxiety will decrease Outcome: Progressing   Problem: Pain Managment: Goal: General experience of comfort will improve Outcome: Progressing   Problem: Safety: Goal: Ability to remain free from injury will improve Outcome: Progressing   

## 2022-09-13 NOTE — Group Note (Signed)
Date:  09/13/2022 Time:  12:12 AM  Group Topic/Focus:  Conflict Resolution:   The focus of this group is to discuss the conflict resolution process and how it may be used upon discharge.    Participation Level:  Did Not Attend  Participation Quality:   Did Not Attend  Affect:   Did Not Attend  Cognitive:   Did Not Attend  Insight: None  Engagement in Group:  None  Modes of Intervention:   Did Not Attend  Additional Comments:    [name] 09/13/2022, 12:12 AM

## 2022-09-13 NOTE — Group Note (Signed)
Recreation Therapy Group Note   Group Topic:Leisure Education  Group Date: 09/13/2022 Start Time: 1400 End Time: 1500 Facilitators: Rosina Lowenstein, LRT, CTRS Location:  Day Room  Group Description: Leisure. Patients were given the option to choose from coloring, singing karaoke, or playing cards. LRT and pts discussed the meaning of leisure, the importance of participating in leisure during their free time/when they're outside of the hospital, as well as how our leisure interests can also serve as coping skills. Pt identified two leisure interests and shared with the group.   Goal Area(s) Addressed:  Patient will identify a current leisure interest.  Patient will learn the definition of "leisure". Patient will practice making a positive decision. Patient will have the opportunity to try a new leisure activity. Patient will communicate with peers and LRT.   Affect/Mood: Appropriate and Full range   Participation Level: Active and Engaged   Participation Quality: Minimal Cues   Behavior: Cooperative and Medical illustrator Process: Coherent   Insight: Fair   Judgement: Fair    Modes of Intervention: Activity   Patient Response to Interventions:  Attentive, Engaged, Interested , and Receptive   Education Outcome:  Acknowledges education   Clinical Observations/Individualized Feedback: Annalysia was active in their participation of session activities and group discussion. Pt sang multiple karaoke songs. She shared that she comes up with her own songs and didn't need any music. Pt was heard singing other random songs while it was other patients turns and needed cues to lower her singing voice. Overall, Pt interacted well with LRT and peers.  Plan: Continue to engage patient in RT group sessions 2-3x/week.   Rosina Lowenstein, LRT, CTRS 09/13/2022 3:15 PM

## 2022-09-13 NOTE — Plan of Care (Signed)
Calm and cooperative. Visible on the unit. Attends group. Po med compliant. Trazodone 50 mg po given PRN for insomnia. No behavior issues noted. Denies SI/HI/AVH. No c/o pain/discomfort noted.  Problem: Nutrition: Goal: Adequate nutrition will be maintained Outcome: Progressing   Problem: Coping: Goal: Level of anxiety will decrease Outcome: Progressing   Problem: Elimination: Goal: Will not experience complications related to bowel motility Outcome: Progressing Goal: Will not experience complications related to urinary retention Outcome: Progressing   Problem: Pain Managment: Goal: General experience of comfort will improve Outcome: Progressing

## 2022-09-13 NOTE — Progress Notes (Signed)
[facility] MD Progress Note  09/13/2022 4:39 PM Betty Henderson  MRN:  [id number] Subjective: Patient seen.  No new complaints.  Seems to be behaving fairly well and she was fairly lucid in her conversation with no new complaints Principal Problem: Schizoaffective disorder, bipolar type (HCC) Diagnosis: Principal Problem:   Schizoaffective disorder, bipolar type (HCC)  Total Time spent with patient: 30 minutes  Past Psychiatric History: Past history of schizophrenia  Past Medical History:  Past Medical History:  Diagnosis Date   Anemia    Arthritis    Chronic pain    Drug-seeking behavior    Malingering    Osteopetrosis    Psychosis (HCC)    Schizoaffective disorder, bipolar type (HCC)     Past Surgical History:  Procedure Laterality Date   MOUTH SURGERY     TUBAL LIGATION     Family History:  Family History  Family history unknown: Yes   Family Psychiatric  History: See previous Social History:  Social History   Substance and Sexual Activity  Alcohol Use Not Currently   Comment: 1 cocktail 3 weeks ago     Social History   Substance and Sexual Activity  Drug Use Not Currently   Types: Cocaine   Comment: states "it's legal though"    Social History   Socioeconomic History   Marital status: Single    Spouse name: Not on file   Number of children: Not on file   Years of education: Not on file   Highest education level: Not on file  Occupational History   Not on file  Tobacco Use   Smoking status: Every Day    Packs/day: .5    Types: Cigarettes   Smokeless tobacco: Never  Vaping Use   Vaping Use: Never used  Substance and Sexual Activity   Alcohol use: Not Currently    Comment: 1 cocktail 3 weeks ago   Drug use: Not Currently    Types: Cocaine    Comment: states "it's legal though"   Sexual activity: Never  Other Topics Concern   Not on file  Social History Narrative   Not on file   Social Determinants of Health   Financial Resource Strain: Not  on file  Food Insecurity: Food Insecurity Present (08/22/2022)   Hunger Vital Sign    Worried About Running Out of Food in the Last Year: Sometimes true    Ran Out of Food in the Last Year: Sometimes true  Transportation Needs: Patient Unable To Answer (08/22/2022)   PRAPARE - Administrator, Civil Service (Medical): Patient unable to answer    Lack of Transportation (Non-Medical): Patient unable to answer  Physical Activity: Not on file  Stress: Not on file  Social Connections: Not on file   Additional Social History:                         Sleep: Fair  Appetite:  Fair  Current Medications: Current Facility-Administered Medications  Medication Dose Route Frequency Provider Last Rate Last Admin   acetaminophen (TYLENOL) tablet 650 mg  650 mg Oral Q6H PRN [name], DO   650 mg at 09/13/22 0214   alum & mag hydroxide-simeth (MAALOX/MYLANTA) 200-200-20 MG/5ML suspension 30 mL  30 mL Oral Q4H PRN [name], DO   30 mL at 09/13/22 0214   diphenhydrAMINE (BENADRYL) capsule 50 mg  50 mg Oral Q6H PRN [name], DO   50 mg at  09/12/22 0509   Or   diphenhydrAMINE (BENADRYL) injection 50 mg  50 mg Intramuscular Q6H PRN [name], DO       haloperidol (HALDOL) tablet 5 mg  5 mg Oral Q6H PRN [name], DO   5 mg at 09/12/22 6962   Or   haloperidol lactate (HALDOL) injection 5 mg  5 mg Intramuscular Q6H PRN [name], DO       ibuprofen (ADVIL) tablet 600 mg  600 mg Oral Q6H PRN Girlie Veltri T, MD   600 mg at 09/12/22 0117   LORazepam (ATIVAN) tablet 1 mg  1 mg Oral TID PRN [name], DO   1 mg at 09/12/22 9528   magnesium hydroxide (MILK OF MAGNESIA) suspension 30 mL  30 mL Oral Daily PRN [name], DO       paliperidone (INVEGA) 24 hr tablet 9 mg  9 mg Oral QHS Dimetrius Montfort T, MD   9 mg at 09/12/22 2152   traZODone (DESYREL) tablet 50 mg  50 mg Oral QHS PRN  [name], DO   50 mg at 09/12/22 2152    Lab Results: No results found for this or any previous visit (from the past 48 hour(s)).  Blood Alcohol level:  Lab Results  Component Value Date   ETH <10 08/17/2022   ETH <10 01/11/2021    Metabolic Disorder Labs: No results found for: "HGBA1C", "MPG" No results found for: "PROLACTIN" No results found for: "CHOL", "TRIG", "HDL", "CHOLHDL", "VLDL", "LDLCALC"  Physical Findings: AIMS:  , ,  ,  ,    CIWA:    COWS:     Musculoskeletal: Strength & Muscle Tone: within normal limits Gait & Station: normal Patient leans: N/A  Psychiatric Specialty Exam:  Presentation  General Appearance:  Casual; Neat  Eye Contact: Fair  Speech: Slow; Slurred  Speech Volume: Decreased  Handedness: Right   Mood and Affect  Mood: Anxious  Affect: Inappropriate   Thought Process  Thought Processes: Disorganized  Descriptions of Associations:Loose  Orientation:Partial  Thought Content:Illogical; Rumination  History of Schizophrenia/Schizoaffective disorder:No data recorded Duration of Psychotic Symptoms:No data recorded Hallucinations:No data recorded Ideas of Reference:Paranoia  Suicidal Thoughts:No data recorded Homicidal Thoughts:No data recorded  Sensorium  Memory: Immediate Fair; Remote Poor  Judgment: Poor  Insight: Poor   Executive Functions  Concentration: Poor  Attention Span: Fair  Recall: [employer] of Knowledge: Fair  Language: Fair   Psychomotor Activity  Psychomotor Activity:No data recorded  Assets  Assets: Communication Skills   Sleep  Sleep:No data recorded   Physical Exam: Physical Exam Vitals and nursing note reviewed.  Constitutional:      Appearance: Normal appearance.  HENT:     Head: Normocephalic and atraumatic.     Mouth/Throat:     Pharynx: Oropharynx is clear.  Eyes:     Pupils: Pupils are equal, round, and reactive to light.   Cardiovascular:     Rate and Rhythm: Normal rate and regular rhythm.  Pulmonary:     Effort: Pulmonary effort is normal.     Breath sounds: Normal breath sounds.  Abdominal:     General: Abdomen is flat.     Palpations: Abdomen is soft.  Musculoskeletal:        General: Normal range of motion.  Skin:    General: Skin is warm and dry.  Neurological:     General: No focal deficit present.     Mental Status: She is  alert. Mental status is at baseline.  Psychiatric:        Attention and Perception: She is inattentive.        Mood and Affect: Mood normal. Affect is blunt.        Speech: Speech is delayed.        Behavior: Behavior is cooperative.        Thought Content: Thought content normal.    Review of Systems  Constitutional: Negative.   HENT: Negative.    Eyes: Negative.   Respiratory: Negative.    Cardiovascular: Negative.   Gastrointestinal: Negative.   Musculoskeletal: Negative.   Skin: Negative.   Neurological: Negative.   Psychiatric/Behavioral: Negative.     Blood pressure 117/82, pulse 79, temperature 97.7 F (36.5 C), resp. rate 18, height 5\' 6"  (1.676 m), weight 61.5 kg, SpO2 100 %. Body mass index is 21.87 kg/m.   Treatment Plan Summary: Plan no change to medication.  Supportive counseling done.  Patient awaiting appropriate placement  Mordecai Rasmussen, MD 09/13/2022, 4:39 PM

## 2022-09-14 NOTE — Group Note (Signed)
Date:  09/14/2022 Time:  9:53 AM  Group Topic/Focus:  Healthy Communication:   The focus of this group is to discuss communication, barriers to communication, as well as healthy ways to communicate with others.    Participation Level:  Active  Participation Quality:  Appropriate  Affect:  Appropriate  Cognitive:  Appropriate  Insight: Appropriate  Engagement in Group:  Engaged  Modes of Intervention:  Discussion  Additional Comments:  None  Rodena Goldmann 09/14/2022, 9:53 AM

## 2022-09-14 NOTE — Group Note (Signed)
Date:  09/14/2022 Time:  9:46 AM  Group Topic/Focus:  Healthy Communication:   The focus of this group is to discuss communication, barriers to communication, as well as healthy ways to communicate with others.    Participation Level:  Active  Participation Quality:  Appropriate  Affect:  Appropriate  Cognitive:  Appropriate  Insight: Appropriate  Engagement in Group:  Engaged  Modes of Intervention:  Discussion  Additional Comments:  none  Rodena Goldmann 09/14/2022, 9:46 AM

## 2022-09-14 NOTE — Group Note (Signed)
Date:  09/14/2022 Time:  11:19 PM  Group Topic/Focus:  Building Self Esteem:   The Focus of this group is helping patients become aware of the effects of self-esteem on their lives, the things they and others do that enhance or undermine their self-esteem, seeing the relationship between their level of self-esteem and the choices they make and learning ways to enhance self-esteem.    Participation Level:  Active  Participation Quality:  Appropriate  Affect:  Appropriate  Cognitive:  Alert  Insight: Appropriate  Engagement in Group:  Engaged  Modes of Intervention:  Rapport Building  Additional Comments:    [name] 09/14/2022, 11:19 PM

## 2022-09-14 NOTE — Group Note (Signed)
Date:  09/14/2022 Time:  3:53 PM  Group Topic/Focus:  Building Self Esteem:   The Focus of this group is helping patients become aware of the effects of self-esteem on their lives, the things they and others do that enhance or undermine their self-esteem, seeing the relationship between their level of self-esteem and the choices they make and learning ways to enhance self-esteem.    Participation Level:  Did Not Attend    Shalaunda Weatherholtz L Gaye Pollack 09/14/2022, 3:53 PM

## 2022-09-14 NOTE — Progress Notes (Signed)
[facility] MD Progress Note  09/14/2022 10:19 AM Betty Henderson  MRN:  [id number] Subjective: Case discussed with staff, patient seen today during rounds.  No new complaints.  Staff reports patient has been attending group per staff report patient speech continues to be garbled at times which is probably patient's baseline .Today patient reports" fine" mood.  She denies any acute events overnight, she was fairly lucid in her conversation.  Patient wanted she is working on her housing situation.  Patient said she might be able to stay with a family member upon discharge.  Patient was encouraged to discuss placement options with Child psychotherapist.  Patient denies thoughts of harming herself or others.  Patient denies auditory visual hallucinations but at times is observed responding to internal stimuli and talking to self  on the unit.,  Probably at baseline  Principal Problem: Schizoaffective disorder, bipolar type (HCC) Diagnosis: Principal Problem:   Schizoaffective disorder, bipolar type (HCC)  Total Time spent with patient: 30 minutes  Past Psychiatric History: Past history of schizophrenia  Past Medical History:  Past Medical History:  Diagnosis Date   Anemia    Arthritis    Chronic pain    Drug-seeking behavior    Malingering    Osteopetrosis    Psychosis (HCC)    Schizoaffective disorder, bipolar type (HCC)     Past Surgical History:  Procedure Laterality Date   MOUTH SURGERY     TUBAL LIGATION     Family History:  Family History  Family history unknown: Yes   Family Psychiatric  History: See previous Social History:  Social History   Substance and Sexual Activity  Alcohol Use Not Currently   Comment: 1 cocktail 3 weeks ago     Social History   Substance and Sexual Activity  Drug Use Not Currently   Types: Cocaine   Comment: states "it's legal though"    Social History   Socioeconomic History   Marital status: Single    Spouse name: Not on file   Number of  children: Not on file   Years of education: Not on file   Highest education level: Not on file  Occupational History   Not on file  Tobacco Use   Smoking status: Every Day    Packs/day: .5    Types: Cigarettes   Smokeless tobacco: Never  Vaping Use   Vaping Use: Never used  Substance and Sexual Activity   Alcohol use: Not Currently    Comment: 1 cocktail 3 weeks ago   Drug use: Not Currently    Types: Cocaine    Comment: states "it's legal though"   Sexual activity: Never  Other Topics Concern   Not on file  Social History Narrative   Not on file   Social Determinants of Health   Financial Resource Strain: Not on file  Food Insecurity: Food Insecurity Present (08/22/2022)   Hunger Vital Sign    Worried About Running Out of Food in the Last Year: Sometimes true    Ran Out of Food in the Last Year: Sometimes true  Transportation Needs: Patient Unable To Answer (08/22/2022)   PRAPARE - Transportation    Lack of Transportation (Medical): Patient unable to answer    Lack of Transportation (Non-Medical): Patient unable to answer  Physical Activity: Not on file  Stress: Not on file  Social Connections: Not on file  Sleep: Fair  Appetite:  Fair  Current Medications: Current Facility-Administered Medications  Medication Dose Route Frequency Provider Last Rate Last Admin   acetaminophen (TYLENOL) tablet 650 mg  650 mg Oral Q6H PRN [name], DO   650 mg at 09/14/22 0107   alum & mag hydroxide-simeth (MAALOX/MYLANTA) 200-200-20 MG/5ML suspension 30 mL  30 mL Oral Q4H PRN [name], DO   30 mL at 09/13/22 0214   diphenhydrAMINE (BENADRYL) capsule 50 mg  50 mg Oral Q6H PRN [name], DO   50 mg at 09/12/22 6962   Or   diphenhydrAMINE (BENADRYL) injection 50 mg  50 mg Intramuscular Q6H PRN [name], DO       haloperidol (HALDOL) tablet 5 mg  5 mg Oral Q6H PRN [name], DO   5 mg  at 09/12/22 9528   Or   haloperidol lactate (HALDOL) injection 5 mg  5 mg Intramuscular Q6H PRN [name], DO       ibuprofen (ADVIL) tablet 600 mg  600 mg Oral Q6H PRN [name], [name], MD   600 mg at 09/12/22 0117   LORazepam (ATIVAN) tablet 1 mg  1 mg Oral TID PRN [name], DO   1 mg at 09/12/22 4132   magnesium hydroxide (MILK OF MAGNESIA) suspension 30 mL  30 mL Oral Daily PRN [name], DO       paliperidone (INVEGA) 24 hr tablet 9 mg  9 mg Oral QHS [name], [name], MD   9 mg at 09/13/22 2141   traZODone (DESYREL) tablet 50 mg  50 mg Oral QHS PRN [name], DO   50 mg at 09/13/22 2141    Lab Results: No results found for this or any previous visit (from the past 48 hour(s)).  Blood Alcohol level:  Lab Results  Component Value Date   ETH <10 08/17/2022   ETH <10 01/11/2021    Metabolic Disorder Labs: No results found for: "HGBA1C", "MPG" No results found for: "PROLACTIN" No results found for: "CHOL", "TRIG", "HDL", "CHOLHDL", "VLDL", "LDLCALC"  Psychiatric Specialty Exam:  Presentation  General Appearance:  Casual; Neat  Eye Contact: Fair  Speech: Garbled at baseline  Speech Volume: Normal Handedness: Right   Mood and Affect  Mood: " Fine"  Affect: Stable   Thought Process  Thought Processes: Improved, goal directed for most of the session, at times have flight of ideas  Descriptions of Associations: At times loose Orientation:Partial  Thought Content: Denies suicidal or homicidal ideation , occasional AVH, close to baseline  Ideas of Reference:Paranoia  Suicidal Thoughts: Denies Homicidal Thoughts: Denies  Sensorium  Memory: Remote memory impaired  Judgment: Fair  Insight: Advertising copywriter  Concentration: Poor  Attention Span: Fair  Recall: Impaired  Language: Fair   Psychomotor Activity  Psychomotor Activity:No data recorded  Assets   Assets: Communication Skills    Physical Exam:  Review of Systems  Constitutional: Negative.   HENT: Negative.    Eyes: Negative.   Respiratory: Negative.    Cardiovascular: Negative.   Gastrointestinal: Negative.   Musculoskeletal: Negative.   Skin: Negative.   Neurological: Negative.   Psychiatric/Behavioral:  Positive for memory loss. Negative for suicidal ideas.    Blood pressure 116/68, pulse 77, temperature (!) 97.2 F (36.2 C), resp. rate 16, height 5\' 6"  (1.676 m), weight 61.5 kg, SpO2 98 %. Body mass index is 21.87 kg/m.   Treatment Plan Summary: Plan no change  to medication.  Supportive counseling done.  Patient awaiting appropriate placement  [name], MD 09/14/2022, 10:19 AM

## 2022-09-14 NOTE — Plan of Care (Signed)
Calm and cooperative. C/o right arm pain. Tylenol 650mg  po given as ordered PRN for discomfort. No behavior issues noted. Denies SI/HI/AVH. Q 15 min checks maintained for safety. Plan of care continued.   Problem: Coping: Goal: Level of anxiety will decrease Outcome: Progressing   Problem: Elimination: Goal: Will not experience complications related to bowel motility Outcome: Progressing Goal: Will not experience complications related to urinary retention Outcome: Progressing   Problem: Pain Managment: Goal: General experience of comfort will improve Outcome: Progressing   Problem: Safety: Goal: Ability to remain free from injury will improve Outcome: Progressing   Problem: Skin Integrity: Goal: Risk for impaired skin integrity will decrease Outcome: Progressing

## 2022-09-14 NOTE — Progress Notes (Signed)
Patient is alert and oriented. Affect flat. Mood labile. She denies depression and anxiety. She denies AVH although she appears to be responding to internal stimuli throughout the shift. Appetite good. Denies pain.   No concerns noted or voiced. No scheduled meds on this shift. No PRN's requested.   Discharge planning ongoing. Q15 minute unit checks in place.

## 2022-09-14 NOTE — Plan of Care (Signed)
  Problem: Elimination: Goal: Will not experience complications related to bowel motility Outcome: Progressing Goal: Will not experience complications related to urinary retention Outcome: Progressing   Problem: Safety: Goal: Ability to remain free from injury will improve Outcome: Progressing   

## 2022-09-14 NOTE — Group Note (Deleted)
Date:  09/14/2022 Time:  3:37 PM  Group Topic/Focus:  Healthy Communication:   The focus of this group is to discuss communication, barriers to communication, as well as healthy ways to communicate with others.     Participation Level:  {[facility] PARTICIPATION LEVEL:22264}  Participation Quality:  {[facility] PARTICIPATION QUALITY:22265}  Affect:  {[facility] AFFECT:22266}  Cognitive:  {[facility] COGNITIVE:22267}  Insight: {[facility] Insight2:20797}  Engagement in Group:  {[facility] ENGAGEMENT IN GROUP:22268}  Modes of Intervention:  {[facility] MODES OF INTERVENTION:22269}  Additional Comments:  ***  Jamie-Lee Galdamez Y Seville Downs 09/14/2022, 3:37 PM  

## 2022-09-14 NOTE — [facility] Group Notes (Signed)
[facility] Group Notes:  (Nursing/MHT/Case Management/Adjunct)  Date:  09/14/2022  Time:  9:39 AM  Type of Therapy:   Outside Rec.  Participation Level:  Active  Participation Quality:  Appropriate  Affect:  Appropriate  Cognitive:  Appropriate  Insight:  Appropriate  Engagement in Group:  Engaged  Modes of Intervention:  Activity and Discussion  Summary of Progress/Problems:  Rodena Goldmann 09/14/2022, 9:39 AM

## 2022-09-15 NOTE — Group Note (Signed)
Date:  09/15/2022 Time:  8:55 PM  Group Topic/Focus:  Conflict Resolution:   The focus of this group is to discuss the conflict resolution process and how it may be used upon discharge.    Participation Level:  Did Not Attend  Participation Quality:   Did Not Attend  Affect:   Did Not Attend  Cognitive:   Did Not Attend  Insight: None  Engagement in Group:  None  Modes of Intervention:  Socialization  Additional Comments:    [name] 09/15/2022, 8:55 PM

## 2022-09-15 NOTE — Plan of Care (Signed)
Calm and cooperative. Flat affect. RIS. Visible on the unit. Limited interaction with peers. Attends group. Po med compliant. No behavior issues noted. Denies SI/HI/AVH. Q 15 min maintained for safety. No c/o pain/discomfort noted.   Problem: Coping: Goal: Level of anxiety will decrease Outcome: Progressing   Problem: Pain Managment: Goal: General experience of comfort will improve Outcome: Progressing   Problem: Safety: Goal: Ability to remain free from injury will improve Outcome: Progressing   Problem: Skin Integrity: Goal: Risk for impaired skin integrity will decrease Outcome: Progressing

## 2022-09-15 NOTE — Group Note (Signed)
[facility] LCSW Group Therapy Note   Group Date: 09/15/2022 Start Time: 1305 End Time: 1404   Type of Therapy/Topic:  Group Therapy:  Balance in Life  Participation Level:  Did Not Attend   Description of Group:    This group will address the concept of balance and how it feels and looks when one is unbalanced. Patients will be encouraged to process areas in their lives that are out of balance, and identify reasons for remaining unbalanced. Facilitators will guide patients utilizing problem- solving interventions to address and correct the stressor making their life unbalanced. Understanding and applying boundaries will be explored and addressed for obtaining  and maintaining a balanced life. Patients will be encouraged to explore ways to assertively make their unbalanced needs known to significant others in their lives, using other group members and facilitator for support and feedback.  Therapeutic Goals: Patient will identify two or more emotions or situations they have that consume much of in their lives. Patient will identify signs/triggers that life has become out of balance:  Patient will identify two ways to set boundaries in order to achieve balance in their lives:  Patient will demonstrate ability to communicate their needs through discussion and/or role plays  Summary of Patient Progress:    The patient did not attend group.     Therapeutic Modalities:   Cognitive Behavioral Therapy Solution-Focused Therapy Assertiveness Training   Betty Bostic S Keiry Kowal, LCSW 

## 2022-09-15 NOTE — Progress Notes (Signed)
Pt has been pleasant today. Pt denies any s/I or h/I,or AVH.Pt sat in dayroom with peers and enjoyed singing along with the television. She attend 1 group today.but not group led by social work.

## 2022-09-15 NOTE — Progress Notes (Signed)
[facility] MD Progress Note  09/15/2022 10:58 AM Betty Henderson  MRN:  [id number] Subjective: Case discussed with staff, chart reviewed ,patient seen today during rounds.  No new complaints.  Staff reports patient has been attending group and taking prescribed medicine.Today patient reports" good" mood.  The patient today mentioned that she might be able to go to her deceased mother's house.  Patient also reports that she has few properties in the area.  Patient reports that she has been in contact with her son, Tramaine Snell.  Patient reports that his son's phone number is [phone].  Patient gave permission for social worker to call patient's son to discuss the discharge plan.  She denies any acute events overnight.  Patient denies thoughts of harming herself or others.  Patient denies auditory visual hallucinations but at times is observed responding to internal stimuli and talking to self  on the unit.,  Probably at baseline  Principal Problem: Schizoaffective disorder, bipolar type (HCC) Diagnosis: Principal Problem:   Schizoaffective disorder, bipolar type (HCC)  Total Time spent with patient: 30 minutes  Past Psychiatric History: Past history of schizophrenia  Past Medical History:  Past Medical History:  Diagnosis Date   Anemia    Arthritis    Chronic pain    Drug-seeking behavior    Malingering    Osteopetrosis    Psychosis (HCC)    Schizoaffective disorder, bipolar type (HCC)     Past Surgical History:  Procedure Laterality Date   MOUTH SURGERY     TUBAL LIGATION     Family History:  Family History  Family history unknown: Yes   Family Psychiatric  History: See previous Social History:  Social History   Substance and Sexual Activity  Alcohol Use Not Currently   Comment: 1 cocktail 3 weeks ago     Social History   Substance and Sexual Activity  Drug Use Not Currently   Types: Cocaine   Comment: states "it's legal though"    Social History    Socioeconomic History   Marital status: Single    Spouse name: Not on file   Number of children: Not on file   Years of education: Not on file   Highest education level: Not on file  Occupational History   Not on file  Tobacco Use   Smoking status: Every Day    Packs/day: .5    Types: Cigarettes   Smokeless tobacco: Never  Vaping Use   Vaping Use: Never used  Substance and Sexual Activity   Alcohol use: Not Currently    Comment: 1 cocktail 3 weeks ago   Drug use: Not Currently    Types: Cocaine    Comment: states "it's legal though"   Sexual activity: Never  Other Topics Concern   Not on file  Social History Narrative   Not on file   Social Determinants of Health   Financial Resource Strain: Not on file  Food Insecurity: Food Insecurity Present (08/22/2022)   Hunger Vital Sign    Worried About Running Out of Food in the Last Year: Sometimes true    Ran Out of Food in the Last Year: Sometimes true  Transportation Needs: Patient Unable To Answer (08/22/2022)   PRAPARE - Transportation    Lack of Transportation (Medical): Patient unable to answer    Lack of Transportation (Non-Medical): Patient unable to answer  Physical Activity: Not on file  Stress: Not on file  Social Connections: Not on file  Sleep: Fair  Appetite:  Fair  Current Medications: Current Facility-Administered Medications  Medication Dose Route Frequency Provider Last Rate Last Admin   acetaminophen (TYLENOL) tablet 650 mg  650 mg Oral Q6H PRN [name], DO   650 mg at 09/14/22 1754   alum & mag hydroxide-simeth (MAALOX/MYLANTA) 200-200-20 MG/5ML suspension 30 mL  30 mL Oral Q4H PRN [name], DO   30 mL at 09/13/22 0214   diphenhydrAMINE (BENADRYL) capsule 50 mg  50 mg Oral Q6H PRN [name], DO   50 mg at 09/12/22 2130   Or   diphenhydrAMINE (BENADRYL) injection 50 mg  50 mg Intramuscular Q6H PRN [name], DO        haloperidol (HALDOL) tablet 5 mg  5 mg Oral Q6H PRN [name], DO   5 mg at 09/12/22 8657   Or   haloperidol lactate (HALDOL) injection 5 mg  5 mg Intramuscular Q6H PRN [name], DO       ibuprofen (ADVIL) tablet 600 mg  600 mg Oral Q6H PRN [name], [name], MD   600 mg at 09/12/22 0117   LORazepam (ATIVAN) tablet 1 mg  1 mg Oral TID PRN [name], DO   1 mg at 09/12/22 8469   magnesium hydroxide (MILK OF MAGNESIA) suspension 30 mL  30 mL Oral Daily PRN [name], DO       paliperidone (INVEGA) 24 hr tablet 9 mg  9 mg Oral QHS [name], [name], MD   9 mg at 09/14/22 2144   traZODone (DESYREL) tablet 50 mg  50 mg Oral QHS PRN [name], DO   50 mg at 09/14/22 2144    Lab Results: No results found for this or any previous visit (from the past 48 hour(s)).  Blood Alcohol level:  Lab Results  Component Value Date   ETH <10 08/17/2022   ETH <10 01/11/2021    Metabolic Disorder Labs: No results found for: "HGBA1C", "MPG" No results found for: "PROLACTIN" No results found for: "CHOL", "TRIG", "HDL", "CHOLHDL", "VLDL", "LDLCALC"  Psychiatric Specialty Exam:  Presentation  General Appearance:  Casual; Neat  Eye Contact: Fair  Speech: Garbled at baseline  Speech Volume: Normal Handedness: Right   Mood and Affect  Mood: " Good"  Affect: Stable   Thought Process  Thought Processes: Improved, goal directed for most of the session, at times have flight of ideas  Descriptions of Associations: At times loose Orientation:Partial  Thought Content: Denies suicidal or homicidal ideation , occasional AVH, close to baseline  Ideas of Reference:Paranoia  Suicidal Thoughts: Denies Homicidal Thoughts: Denies  Sensorium  Memory: Remote memory impaired  Judgment: Fair  Insight: Advertising copywriter  Concentration: Poor  Attention  Span: Fair  Recall: Impaired  Language: Fair   Psychomotor Activity  Psychomotor Activity:No data recorded  Assets  Assets: Communication Skills    Physical Exam:  Review of Systems  Constitutional: Negative.   HENT: Negative.    Eyes: Negative.   Respiratory: Negative.    Cardiovascular: Negative.   Gastrointestinal: Negative.   Musculoskeletal: Negative.   Skin: Negative.   Neurological: Negative.   Psychiatric/Behavioral:  Positive for memory loss. Negative for suicidal ideas.    Blood pressure (!) 108/59, pulse 73, temperature (!) 97.3 F (36.3 C), resp. rate 16, height 5\' 6"  (1.676 m), weight 61.5 kg, SpO2 100 %. Body mass index is 21.87 kg/m.   Treatment Plan Summary: Plan no  change to medication.  Supportive counseling done.  Patient awaiting appropriate placement .  Social worker consulted to get collateral from family.  [name], MD 09/15/2022, 10:58 AM

## 2022-09-15 NOTE — Plan of Care (Signed)
D- Patient alert and oriented. To person and situation. Pt was singing today, appears to be in a good mood.. Denies SI, HI, AVH, and pain.A- Scheduled medications administered to patient, per MD orders. Support and encouragement provided.  Routine safety checks conducted every 15 minutes.  Patient informed to notify staff with problems or concerns. R- No adverse drug reactions noted. Patient contracts for safety at this time. Patient compliant with medications and treatment plan. Patient receptive, calm, and cooperative. Patient interacts well with others on the unit.  Patient remains safe at this time.

## 2022-09-16 NOTE — Plan of Care (Addendum)
Pt is irritable and agitated. Loud and hollering. RIS. Talking to herself. States she wants to go home. Did not participant in group. Ativan 1mg  po, Benadryl 50 mg po and Haldol 5mg  po given as ordered PRN for agitation with good results. Tol well. Denies SI/HI/AV/H. Q 15 min checks maintained for safety. Pt resting quietly in bed with eyes closed. No c/o pain/discomfort noted.   Problem: Coping: Goal: Level of anxiety will decrease Outcome: Progressing   Problem: Elimination: Goal: Will not experience complications related to bowel motility Outcome: Progressing Goal: Will not experience complications related to urinary retention Outcome: Progressing   Problem: Pain Managment: Goal: General experience of comfort will improve Outcome: Progressing   Problem: Safety: Goal: Ability to remain free from injury will improve Outcome: Progressing

## 2022-09-16 NOTE — Progress Notes (Signed)
Pt was unhappy at dinner time because she exceeded her carb count. Pt has no understanding about carb count. Pt did go to her room and talk loudly, in response to internal stimuli. Pt then came out and asked for snacks. Pt was redirected to wait   until snack time.

## 2022-09-16 NOTE — Progress Notes (Signed)
[facility] MD Progress Note  09/16/2022 10:46 AM Betty Henderson  MRN:  [id number] Subjective: Case discussed with staff, chart reviewed ,patient seen today during rounds.  Per staff report patient reports irritable and agitated yesterday night.  She reportedly was loud and hollering.  She was responding to internal stimuli.  Talking to herself.  Patient received Ativan 1mg  po, Benadryl 50 mg po and Haldol 5mg  po given as ordered PRN for agitation with good results.  Today during rounds patient was alert with stable affect.  Patient reports that she might have had a nightmare last night.  She denies any agitation today.  She was pleasant to talk to.  Patient was encouraged to work on coping strategies and attend group.     patient denies thoughts of harming herself or others.  Patient denies auditory visual hallucinations but at times is observed responding to internal stimuli and talking to self  on the unit.,  Probably at baseline.  Case was discussed with social worker at the end.  Per social worker patient family is not able to take patient back home upon discharge.  Social worker is working on placement for this patient.  Principal Problem: Schizoaffective disorder, bipolar type (HCC) Diagnosis: Principal Problem:   Schizoaffective disorder, bipolar type (HCC)  Total Time spent with patient: 30 minutes  Past Psychiatric History: Past history of schizophrenia  Past Medical History:  Past Medical History:  Diagnosis Date   Anemia    Arthritis    Chronic pain    Drug-seeking behavior    Malingering    Osteopetrosis    Psychosis (HCC)    Schizoaffective disorder, bipolar type (HCC)     Past Surgical History:  Procedure Laterality Date   MOUTH SURGERY     TUBAL LIGATION     Family History:  Family History  Family history unknown: Yes   Family Psychiatric  History: See previous Social History:  Social History   Substance and Sexual Activity  Alcohol Use Not Currently   Comment: 1  cocktail 3 weeks ago     Social History   Substance and Sexual Activity  Drug Use Not Currently   Types: Cocaine   Comment: states "it's legal though"    Social History   Socioeconomic History   Marital status: Single    Spouse name: Not on file   Number of children: Not on file   Years of education: Not on file   Highest education level: Not on file  Occupational History   Not on file  Tobacco Use   Smoking status: Every Day    Packs/day: .5    Types: Cigarettes   Smokeless tobacco: Never  Vaping Use   Vaping Use: Never used  Substance and Sexual Activity   Alcohol use: Not Currently    Comment: 1 cocktail 3 weeks ago   Drug use: Not Currently    Types: Cocaine    Comment: states "it's legal though"   Sexual activity: Never  Other Topics Concern   Not on file  Social History Narrative   Not on file   Social Determinants of Health   Financial Resource Strain: Not on file  Food Insecurity: Food Insecurity Present (08/22/2022)   Hunger Vital Sign    Worried About Running Out of Food in the Last Year: Sometimes true    Ran Out of Food in the Last Year: Sometimes true  Transportation Needs: Patient Unable To Answer (08/22/2022)   PRAPARE - Transportation    Lack  of Transportation (Medical): Patient unable to answer    Lack of Transportation (Non-Medical): Patient unable to answer  Physical Activity: Not on file  Stress: Not on file  Social Connections: Not on file                       Sleep: Fair  Appetite:  Fair  Current Medications: Current Facility-Administered Medications  Medication Dose Route Frequency Provider Last Rate Last Admin   acetaminophen (TYLENOL) tablet 650 mg  650 mg Oral Q6H PRN [name], DO   650 mg at 09/14/22 1754   alum & mag hydroxide-simeth (MAALOX/MYLANTA) 200-200-20 MG/5ML suspension 30 mL  30 mL Oral Q4H PRN [name], DO   30 mL at 09/13/22 0214   diphenhydrAMINE (BENADRYL) capsule 50 mg  50  mg Oral Q6H PRN [name], DO   50 mg at 09/15/22 2107   Or   diphenhydrAMINE (BENADRYL) injection 50 mg  50 mg Intramuscular Q6H PRN [name], DO       haloperidol (HALDOL) tablet 5 mg  5 mg Oral Q6H PRN [name], DO   5 mg at 09/15/22 2107   Or   haloperidol lactate (HALDOL) injection 5 mg  5 mg Intramuscular Q6H PRN [name], DO       ibuprofen (ADVIL) tablet 600 mg  600 mg Oral Q6H PRN [name], [name], MD   600 mg at 09/15/22 1810   LORazepam (ATIVAN) tablet 1 mg  1 mg Oral TID PRN [name], DO   1 mg at 09/15/22 2107   magnesium hydroxide (MILK OF MAGNESIA) suspension 30 mL  30 mL Oral Daily PRN [name], DO       paliperidone (INVEGA) 24 hr tablet 9 mg  9 mg Oral QHS [name], [name], MD   9 mg at 09/15/22 2107   traZODone (DESYREL) tablet 50 mg  50 mg Oral QHS PRN [name], DO   50 mg at 09/15/22 2107    Lab Results: No results found for this or any previous visit (from the past 48 hour(s)).  Blood Alcohol level:  Lab Results  Component Value Date   ETH <10 08/17/2022   ETH <10 01/11/2021    Metabolic Disorder Labs: No results found for: "HGBA1C", "MPG" No results found for: "PROLACTIN" No results found for: "CHOL", "TRIG", "HDL", "CHOLHDL", "VLDL", "LDLCALC"  Psychiatric Specialty Exam:  Presentation  General Appearance:  Casual; Neat  Eye Contact: Fair  Speech: Garbled at baseline  Speech Volume: Normal Handedness: Right   Mood and Affect  Mood: " Good"  Affect: Stable   Thought Process  Thought Processes: Improved, goal directed for most of the session, at times have flight of ideas  Descriptions of Associations: At times loose Orientation:Partial  Thought Content: Denies suicidal or homicidal ideation , denies  AVH, patient is observed talking to self in her room and responding to internal stimuli  Ideas of Reference:Paranoia  Suicidal  Thoughts: Denies Homicidal Thoughts: Denies  Sensorium  Memory: Remote memory impaired  Judgment: Fair  Insight: Advertising copywriter  Concentration: Poor  Attention Span: Fair  Recall: Impaired  Language: Fair   Psychomotor Activity  Psychomotor Activity:No data recorded  Assets  Assets: Communication Skills    Physical Exam:  Review of Systems  Constitutional: Negative.   HENT: Negative.    Eyes: Negative.   Respiratory: Negative.    Cardiovascular: Negative.  Gastrointestinal: Negative.   Musculoskeletal: Negative.   Skin: Negative.   Neurological: Negative.   Psychiatric/Behavioral:  Positive for memory loss. Negative for suicidal ideas.    Blood pressure 112/65, pulse 82, temperature 98.3 F (36.8 C), resp. rate 16, height 5\' 6"  (1.676 m), weight 64 kg, SpO2 99 %. Body mass index is 22.76 kg/m.   Treatment Plan Summary: Plan no change to medication.  Supportive counseling done.  Patient awaiting appropriate placement .  Social worker consulted to get collateral from family.  [name], MD 09/16/2022, 10:46 AM

## 2022-09-16 NOTE — Progress Notes (Signed)
   09/16/22 2100  Psych Admission Type (Psych Patients Only)  Admission Status Voluntary  Psychosocial Assessment  Patient Complaints None  Eye Contact Fair  Facial Expression Animated  Affect Appropriate to circumstance  Speech Soft  Interaction Assertive  Motor Activity Slow  Appearance/Hygiene Unremarkable  Behavior Characteristics Cooperative  Mood Pleasant  Thought Process  Coherency WDL  Content WDL  Delusions None reported or observed  Perception Hallucinations  Hallucination Auditory  Judgment Impaired  Confusion Mild  Danger to Self  Current suicidal ideation? Denies  Danger to Others  Danger to Others None reported or observed

## 2022-09-16 NOTE — Group Note (Signed)
Date:  09/16/2022 Time:  10:47 PM  Group Topic/Focus:  Building Self Esteem:   The Focus of this group is helping patients become aware of the effects of self-esteem on their lives, the things they and others do that enhance or undermine their self-esteem, seeing the relationship between their level of self-esteem and the choices they make and learning ways to enhance self-esteem. Coping With Mental Health Crisis:   The purpose of this group is to help patients identify strategies for coping with mental health crisis.  Group discusses possible causes of crisis and ways to manage them effectively. Crisis Planning:   The purpose of this group is to help patients create a crisis plan for use upon discharge or in the future, as needed. Developing a Wellness Toolbox:   The focus of this group is to help patients develop a "wellness toolbox" with skills and strategies to promote recovery upon discharge. Goals Group:   The focus of this group is to help patients establish daily goals to achieve during treatment and discuss how the patient can incorporate goal setting into their daily lives to aide in recovery. Healthy Communication:   The focus of this group is to discuss communication, barriers to communication, as well as healthy ways to communicate with others. Identifying Needs:   The focus of this group is to help patients identify their personal needs that have been historically problematic and identify healthy behaviors to address their needs. Making Healthy Choices:   The focus of this group is to help patients identify negative/unhealthy choices they were using prior to admission and identify positive/healthier coping strategies to replace them upon discharge. Overcoming Stress:   The focus of this group is to define stress and help patients assess their triggers. Personal Choices and Values:   The focus of this group is to help patients assess and explore the importance of values in their lives, how  their values affect their decisions, how they express their values and what opposes their expression. Recovery Goals:   The focus of this group is to identify appropriate goals for recovery and establish a plan to achieve them. Self Care:   The focus of this group is to help patients understand the importance of self-care in order to improve or restore emotional, physical, spiritual, interpersonal, and financial health.    Participation Level:  Minimal  Participation Quality:  Attentive  Affect:  Appropriate  Cognitive:  Appropriate  Insight: Improving  Engagement in Group:  Off Topic  Modes of Intervention:  Discussion  Additional Comments:    Betty Henderson 09/16/2022, 10:47 PM

## 2022-09-16 NOTE — Plan of Care (Signed)
D- Patient alert and oriented. To situation Pt is in a good mood today and went outside with staff and peers. Denies SI, HI, AVH, and pain. Pt denies AVH but pt is noted to be responding to internal stimuli A- Scheduled medications administered to patient, per MD orders. Support and encouragement provided.  Routine safety checks conducted every 15 minutes.  Patient informed to notify staff with problems or concerns. R- No adverse drug reactions noted. Patient contracts for safety at this time. Patient compliant with medications and treatment plan. Patient receptive, calm, and cooperative. Patient interacts well with others on the unit.  Patient remains safe at this time.

## 2022-09-16 NOTE — Group Note (Signed)
Recreation Therapy Group Note   Group Topic:Emotion Expression  Group Date: 09/16/2022 Start Time: 1400 End Time: 1450 Facilitators: Rosina Lowenstein, LRT, CTRS Location:  Day Room  Group Description: Positivity Collage. LRT and patients discussed the importance of having a positive mindset and being happy. Patients received magazines, safety scissors, a glue stick and a piece of paper. Pts were encouraged to find images or words in the magazines that showed "happiness" or positivity to them. Pt shared their collage with the group once they were finished. LRT and pts discussed how it can be difficult to always have a positive mindset, especially when they have mental health challenges.   Goal Area(s) Addressed:  Pt will identify things associate with positivity. Pt will reduce negative thinking. Pt will identify a new coping skill of thinking positive thoughts.   Affect/Mood: Appropriate and Flat   Participation Level: Active and Engaged   Participation Quality: Independent   Behavior: Appropriate, Calm, and Cooperative   Speech/Thought Process: Disorganized   Insight: Fair   Judgement: Fair    Modes of Intervention: Activity and Art   Patient Response to Interventions:  Attentive, Engaged, and Receptive   Education Outcome:  In group clarification offered    Clinical Observations/Individualized Feedback: Shireen was active in their participation of session activities and group discussion. Pt identified "tea pots, coffee and soup" as some things that are positive that make her happy. Pt interacted well with LRT and peers duration of session.   Plan: Continue to engage patient in RT group sessions 2-3x/week.   [address], LRT, CTRS 09/16/2022 3:12 PM

## 2022-09-17 DIAGNOSIS — F250 Schizoaffective disorder, bipolar type: Secondary | ICD-10-CM | POA: Diagnosis not present

## 2022-09-17 NOTE — Plan of Care (Signed)
D- Patient alert and oriented. Affect is friendly, mood is good.. Denies SI, HI, AVH, and pain. Pt continues to respond to internal stimili A- Scheduled medications administered to patient, per MD orders. Support and encouragement provided.  Routine safety checks conducted every 15 minutes.  Patient informed to notify staff with problems or concerns. R- No adverse drug reactions noted. Patient contracts for safety at this time. Patient compliant with medications and treatment plan. Patient receptive, calm, and cooperative. Patient interacts well with others on the unit.  Patient remains safe at this time.

## 2022-09-17 NOTE — Group Note (Signed)
Date:  09/17/2022 Time:  6:23 PM  Group Topic/Focus:  Coping With Mental Health Crisis:   The purpose of this group is to help patients identify strategies for coping with mental health crisis.  Group discusses possible causes of crisis and ways to manage them effectively.    Participation Level:  Active  Participation Quality:  Appropriate  Affect:  Appropriate  Cognitive:  Appropriate  Insight: Appropriate  Engagement in Group:  Engaged  Modes of Intervention:  Discussion  Additional Comments:     Betty Henderson L Betty Henderson 09/17/2022, 6:23 PM  

## 2022-09-17 NOTE — Progress Notes (Signed)
Pt upset this afternoon. Pt verbalized dismay about not being discharged today and not seeing podiatrist. Re assured patient that social work will be made aware of her continued admission. Pt calmed.

## 2022-09-17 NOTE — Progress Notes (Signed)
Pt had good response from prn haldol,benadryl and ativan. Pt is no longer yelling in the milieu. Pt is resting on her bed,with eyes closed. Respirations are even and unlabored.

## 2022-09-17 NOTE — Group Note (Signed)
Recreation Therapy Group Note   Group Topic:Communication  Group Date: 09/17/2022 Start Time: 0900 End Time: 0950 Facilitators: Rosina Lowenstein, LRT, CTRS Location: Courtyard  Group Description: Reminisce Cards. Patients drew a laminated card out of a bag that had a word or phrase on it. Pt encouraged to speak about a time in their life or fond memory that specifically relates to the word they chose out of the bag. An example would be: "parenthood, meals, siblings, travel, or home".  LRT prompted following questions and encouraged contribution from peers to increase communication.   Goal Area(s) Addressed: Patient will increase verbal communication by conversing with peers. Patient will contribute to group discussion with minimal prompting. Patient will reminisce a positive memory or moment in their life  Affect/Mood: Full range   Participation Level: Active and Hyperverbal   Participation Quality: Minimal Cues   Behavior: Alert   Speech/Thought Process: Flight of ideas   Insight: Fair   Judgement: Limited   Modes of Intervention: Guided Discussion   Patient Response to Interventions:  Receptive   Education Outcome:  In group clarification offered    Clinical Observations/Individualized Feedback: Anatalia was mostly active  in their participation of session activities and group discussion. Pt identified "I use to good dogs in a blanket, I mean pigs in a blanket." Pt was tangential and required redirection to let others speak and so that they could be heard. Pt was receptive to redirection. Pt was calm and had a bright affect.    Plan: Continue to engage patient in RT group sessions 2-3x/week.   Rosina Lowenstein, LRT, CTRS 09/17/2022 11:12 AM

## 2022-09-17 NOTE — BHH Counselor (Signed)
CSW was informed that the patients case was "screened in" at [facility] DSS.  CSW received call from [place].   Patsy Lager also reports that the patient "has been assigned a Child psychotherapist". CSW inquired who the social worker assigned to the case would be.  CSW was informed that Patsy Lager "do not know" and call was terminated.  Penni Homans, MSW, LCSW 09/17/2022 12:32 PM

## 2022-09-17 NOTE — BHH Counselor (Signed)
CSW contacted [facility], [phone] and spoke with Patsy Lager.  CSW was placed on hold while Yolanda checked to see if they were the right county to take the call as she stated patient was in [place] and [place] should receive the call.  CSW pointed out that patient was from [place], that appears to be the patient's home county.  Yolanda reviewed patient's marital status, CSW reported that she was unsure as patient is a poor historian, however, PSA completed indicates that patient is widowed from 9 years ago. CSW provided information on patient having allegedly 2 or 3 adult sons.  CSW provided contact information for patient's son, Fredia Chittenden, [phone].  Yolanda inquired on patient's usually hangs out.  CSW informed that she does not know where patient spends her time,  all that is known is patient is from [facility] [facility].    Yolanda asked if "has the hospital applied for guardianship, because the hospital can do that". CSW stated that at this time the recommendation from leadership has been to make an APS report with [facility].   Yolanda requested information on patient's disposition and CSW informed that patient is psychotic at baseline.  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    Yolanda requested information on patient's eating and sleeping habits.  CSW reported that this is unknown to this Clinical research associate.  CSW did inform that patient is able to feed herself.  CSW stated concerns on patient's ability to care for herself.  Unlikely that patient could administer her own medication, maintain bills for a home, earns income or could cook for herself.  Patient is able to dress herself and address hygiene.  Patient is able to make phone calls. Unknown at this time if patient has access to a cell phone.  DSS requested  information on if patient would be discharging soon and CSW informed that she is unaware of a discharge date.  DSS requests that if case is screened in patient remains in the hospital until an interview can be completed.   DSS wanted to know when patient was last seen face to face by this writer and CSW informed that it was 09/16/2022.  DSS wanted information on patient's PCP, CSW informed that this writer is unsure of the accuracy, however, Epic indicates that patient sees [facility].  DSS wanted information on patient's health.  CSW provided past medical history listed on patient's H&P.  CSW provided physical description of patient.  CSW informed that she was unaware of any wounds.    Penni Homans, MSW, LCSW 09/17/2022 10:22 AM

## 2022-09-17 NOTE — Group Note (Signed)
Date:  09/17/2022 Time:  2:57 PM  Group Topic/Focus:  Coping With Mental Health Crisis:   The purpose of this group is to help patients identify strategies for coping with mental health crisis.  Group discusses possible causes of crisis and ways to manage them effectively.    Participation Level:  Minimal  Participation Quality:  Appropriate  Affect:  Appropriate  Cognitive:  Appropriate  Insight: Appropriate  Engagement in Group:  Engaged and Improving  Modes of Intervention:  Activity  Additional Comments:    Doug Sou 09/17/2022, 2:57 PM

## 2022-09-17 NOTE — Progress Notes (Signed)
   09/17/22 0612  15 Minute Checks  Location Bedroom  Visual Appearance Calm  Behavior Composed  Sleep (Behavioral Health Patients Only)  Calculate sleep? (Click Yes once per 24 hr at 0600 safety check) Yes  Documented sleep last 24 hours 9

## 2022-09-17 NOTE — Progress Notes (Signed)
Pt has outburst of yelling in dayroom. Pt appears to be responding to internal stimuli. Pt given prn of haldol,benadryl and ativan po. Pt required redirection several times.

## 2022-09-17 NOTE — Group Note (Signed)
Date:  09/17/2022 Time:  3:06 PM  Group Topic/Focus:  Self Care:   The focus of this group is to help patients understand the importance of self-care in order to improve or restore emotional, physical, spiritual, interpersonal, and financial health.  Exercise and relaxation group..    Participation Level:  Did Not Attend   Doug Sou 09/17/2022, 3:06 PM

## 2022-09-17 NOTE — Group Note (Signed)
Date:  09/17/2022 Time:  2:52 PM  Group Topic/Focus:  Wellness Toolbox:   The focus of this group is to discuss various aspects of wellness, balancing those aspects and exploring ways to increase the ability to experience wellness.  Patients will create a wellness toolbox for use upon discharge.  Music Therapy   Participation Level:  Active  Participation Quality:  Appropriate  Affect:  Appropriate  Cognitive:  Appropriate  Insight: Appropriate  Engagement in Group:  Lacking  Modes of Intervention:  Discussion  Additional Comments:    Doug Sou 09/17/2022, 2:52 PM

## 2022-09-17 NOTE — Group Note (Signed)
[facility] LCSW Group Therapy Note   Group Date: 09/17/2022 Start Time: 1315 End Time: 1400   Type of Therapy/Topic:  Group Therapy:  Balance in Life  Participation Level:  Did Not Attend   Description of Group:    This group will address the concept of balance and how it feels and looks when one is unbalanced. Patients will be encouraged to process areas in their lives that are out of balance, and identify reasons for remaining unbalanced. Facilitators will guide patients utilizing problem- solving interventions to address and correct the stressor making their life unbalanced. Understanding and applying boundaries will be explored and addressed for obtaining  and maintaining a balanced life. Patients will be encouraged to explore ways to assertively make their unbalanced needs known to significant others in their lives, using other group members and facilitator for support and feedback.  Therapeutic Goals: Patient will identify two or more emotions or situations they have that consume much of in their lives. Patient will identify signs/triggers that life has become out of balance:  Patient will identify two ways to set boundaries in order to achieve balance in their lives:  Patient will demonstrate ability to communicate their needs through discussion and/or role plays  Summary of Patient Progress:    Pt left common room when group was announced and did not attend.     Therapeutic Modalities:   Cognitive Behavioral Therapy Solution-Focused Therapy Assertiveness Training   Elza Rafter, [place]

## 2022-09-17 NOTE — BH IP Treatment Plan (Signed)
Interdisciplinary Treatment and Diagnostic Plan Update  09/17/2022 Time of Session: 9:00 AM  Betty Henderson MRN: [id number]  Principal Diagnosis: Schizoaffective disorder, bipolar type (HCC)  Secondary Diagnoses: Principal Problem:   Schizoaffective disorder, bipolar type (HCC)   Current Medications:  Current Facility-Administered Medications  Medication Dose Route Frequency Provider Last Rate Last Admin   acetaminophen (TYLENOL) tablet 650 mg  650 mg Oral Q6H PRN [name], DO   650 mg at 09/14/22 1754   alum & mag hydroxide-simeth (MAALOX/MYLANTA) 200-200-20 MG/5ML suspension 30 mL  30 mL Oral Q4H PRN [name], DO   30 mL at 09/13/22 0214   diphenhydrAMINE (BENADRYL) capsule 50 mg  50 mg Oral Q6H PRN [name], DO   50 mg at 09/15/22 2107   Or   diphenhydrAMINE (BENADRYL) injection 50 mg  50 mg Intramuscular Q6H PRN [name], DO       haloperidol (HALDOL) tablet 5 mg  5 mg Oral Q6H PRN [name], DO   5 mg at 09/15/22 2107   Or   haloperidol lactate (HALDOL) injection 5 mg  5 mg Intramuscular Q6H PRN [name], DO       ibuprofen (ADVIL) tablet 600 mg  600 mg Oral Q6H PRN [name], [name], MD   600 mg at 09/15/22 1810   LORazepam (ATIVAN) tablet 1 mg  1 mg Oral TID PRN [name], DO   1 mg at 09/15/22 2107   magnesium hydroxide (MILK OF MAGNESIA) suspension 30 mL  30 mL Oral Daily PRN [name], DO       paliperidone (INVEGA) 24 hr tablet 9 mg  9 mg Oral QHS [name], [name], MD   9 mg at 09/16/22 2130   traZODone (DESYREL) tablet 50 mg  50 mg Oral QHS PRN [name], DO   50 mg at 09/16/22 2130   PTA Medications: Medications Prior to Admission  Medication Sig Dispense Refill Last Dose   ondansetron (ZOFRAN) 4 MG tablet Take 1 tablet (4 mg total) by mouth every 8 (eight) hours as needed for nausea or vomiting. (Patient not taking: Reported on 08/17/2022) 20  tablet 0     Patient Stressors: Financial difficulties   Health problems   Medication change or noncompliance    Patient Strengths: Ability for insight   Treatment Modalities: Medication Management, Group therapy, Case management,  1 to 1 session with clinician, Psychoeducation, Recreational therapy.   Physician Treatment Plan for Primary Diagnosis: Schizoaffective disorder, bipolar type (HCC) Long Term Goal(s): Improvement in symptoms so as ready for discharge   Short Term Goals: Ability to identify changes in lifestyle to reduce recurrence of condition will improve Ability to verbalize feelings will improve Ability to disclose and discuss suicidal ideas Ability to demonstrate self-control will improve Ability to identify and develop effective coping behaviors will improve Ability to maintain clinical measurements within normal limits will improve Compliance with prescribed medications will improve Ability to identify triggers associated with substance abuse/mental health issues will improve  Medication Management: Evaluate patient's response, side effects, and tolerance of medication regimen.  Therapeutic Interventions: 1 to 1 sessions, Unit Group sessions and Medication administration.  Evaluation of Outcomes: Not Progressing  Physician Treatment Plan for Secondary Diagnosis: Principal Problem:   Schizoaffective disorder, bipolar type (HCC)  Long Term Goal(s): Improvement in symptoms so as ready for discharge   Short Term Goals: Ability to identify changes in lifestyle to reduce recurrence of condition will  improve Ability to verbalize feelings will improve Ability to disclose and discuss suicidal ideas Ability to demonstrate self-control will improve Ability to identify and develop effective coping behaviors will improve Ability to maintain clinical measurements within normal limits will improve Compliance with prescribed medications will improve Ability to identify  triggers associated with substance abuse/mental health issues will improve     Medication Management: Evaluate patient's response, side effects, and tolerance of medication regimen.  Therapeutic Interventions: 1 to 1 sessions, Unit Group sessions and Medication administration.  Evaluation of Outcomes: Not Progressing   RN Treatment Plan for Primary Diagnosis: Schizoaffective disorder, bipolar type (HCC) Long Term Goal(s): Knowledge of disease and therapeutic regimen to maintain health will improve  Short Term Goals: Ability to remain free from injury will improve, Ability to verbalize frustration and anger appropriately will improve, Ability to demonstrate self-control, Ability to participate in decision making will improve, Ability to verbalize feelings will improve, Ability to disclose and discuss suicidal ideas, Ability to identify and develop effective coping behaviors will improve, and Compliance with prescribed medications will improve  Medication Management: RN will administer medications as ordered by provider, will assess and evaluate patient's response and provide education to patient for prescribed medication. RN will report any adverse and/or side effects to prescribing provider.  Therapeutic Interventions: 1 on 1 counseling sessions, Psychoeducation, Medication administration, Evaluate responses to treatment, Monitor vital signs and CBGs as ordered, Perform/monitor CIWA, COWS, AIMS and Fall Risk screenings as ordered, Perform wound care treatments as ordered.  Evaluation of Outcomes: Not Progressing   LCSW Treatment Plan for Primary Diagnosis: Schizoaffective disorder, bipolar type (HCC) Long Term Goal(s): Safe transition to appropriate next level of care at discharge, Engage patient in therapeutic group addressing interpersonal concerns.  Short Term Goals: Engage patient in aftercare planning with referrals and resources, Increase social support, Increase ability to  appropriately verbalize feelings, Increase emotional regulation, Facilitate acceptance of mental health diagnosis and concerns, Identify triggers associated with mental health/substance abuse issues, and Increase skills for wellness and recovery  Therapeutic Interventions: Assess for all discharge needs, 1 to 1 time with Social worker, Explore available resources and support systems, Assess for adequacy in community support network, Educate family and significant other(s) on suicide prevention, Complete Psychosocial Assessment, Interpersonal group therapy.  Evaluation of Outcomes: Not Progressing   Progress in Treatment: Attending groups: No. Participating in groups: No. Taking medication as prescribed: Yes. Toleration medication: Yes. Family/Significant other contact made: No, will contact:  pt declined Patient understands diagnosis: Yes. Discussing patient identified problems/goals with staff: Yes. Medical problems stabilized or resolved: Yes. Denies suicidal/homicidal ideation: Yes. Issues/concerns per patient self-inventory: No. Other: None   New problem(s) identified: No, Describe:  None identified   New Short Term/Long Term Goal(s): detox, elimination of symptoms of psychosis, medication management for mood stabilization; elimination of SI thoughts; development of comprehensive mental wellness/sobriety plan.  Update 08/28/2022:  No changes at this time.  Update 09/02/2022: No changes at this time. Update 09/07/22: There are no changes at this time. Update 09/12/22 No changes at this time Update 09/17/22 There are no changes at this time    Patient Goals:  Patient was present in group. Patient too disorganized to provide a goal at this time. Update 08/28/2022:  No changes at this time. Update 09/02/2022: No changes at this time. Update 09/07/22: There are no changes at this time. Update 09/12/22 No changes at this time Update 09/17/22 There are no changes at this time  Discharge Plan or  Barriers: CSW to assist patient in development of appropriate discharge plans.  Update 08/28/2022:  Patient remains psychotic at this time.  Progress has been limited.  Unable to identify placement for patient.  Discussions are being had on contacting DSS to make possible APS report for guardianship for patient. Update 09/02/2022: CSW spoke with the patients son who reports that patient is at baseline.  He reports that this has been his mothers disposition for 20 years.  He reports that she does not have any supports and is currently homeless.  CSW to continue to assist in development of a safe discharge plan. Update 09/02/2022: The patient doesn't have the supports that she can stay with or provide any real support to her. An email was sent about making an APS report due to safety concerns and possible need for a guardian. Update 09/12/22: Pt remains psychotic at this time. No place to go, no supports, pending status on APS report Update 09/17/22 Pt remains psychotic at this time. Leadership recommends an APS report at this time. APS was contact this morning and a report was made. They will be screening her in and will be assessing her here at the hospital per Lorita Officer at [facility].     Reason for Continuation of Hospitalization: Delusions  Depression Medication stabilization   Estimated Length of Stay:  Update 08/28/2022:  No changes at this time. Update 09/02/2022: TBD.  Update 09/02/2022: TBD Update 09/12/22 TBD Update 09/17/22 TBD  Last 3 [place] Suicide Severity Risk Score: Flowsheet Row Admission (Current) from 08/22/2022 in [facility] BE[facility] ED from 08/17/2022 in [facility] Emergency Department at [facility] ED from 08/12/2022 in [facility] Emergency Department at [facility]  C-SSRS RISK CATEGORY Low Risk No Risk No Risk       Last [facility] 2/9 Scores:     No data to display          Scribe for Treatment Team: Elza Rafter, Theresia Majors 09/17/2022 2:04  PM

## 2022-09-17 NOTE — Progress Notes (Signed)
[facility] MD Progress Note  09/17/2022 1:48 PM Betty Henderson  MRN:  [id number] Subjective:  Betty Henderson reports that she is "just hanging out."  She laughs.  Reports that she came to the hospital because her toenails were getting too long, and indicates that she was becoming angry because of it.  She does admit to auditory hallucinations "they are cartoonlike, to have a sense of humor."  She denies have any distress or challenges today.  No aggression hostility or volatility.  She is sleeping and eating well.  Denies thoughts of suicide.  Principal Problem: Schizoaffective disorder, bipolar type (HCC) Diagnosis: Principal Problem:   Schizoaffective disorder, bipolar type (HCC)  Total Time spent with patient: 22 mins  Past Psychiatric History: Past history of schizophrenia  Past Medical History:  Past Medical History:  Diagnosis Date   Anemia    Arthritis    Chronic pain    Drug-seeking behavior    Malingering    Osteopetrosis    Psychosis (HCC)    Schizoaffective disorder, bipolar type (HCC)     Past Surgical History:  Procedure Laterality Date   MOUTH SURGERY     TUBAL LIGATION     Family History:  Family History  Family history unknown: Yes   Family Psychiatric  History: See previous Social History:  Social History   Substance and Sexual Activity  Alcohol Use Not Currently   Comment: 1 cocktail 3 weeks ago     Social History   Substance and Sexual Activity  Drug Use Not Currently   Types: Cocaine   Comment: states "it's legal though"    Social History   Socioeconomic History   Marital status: Single    Spouse name: Not on file   Number of children: Not on file   Years of education: Not on file   Highest education level: Not on file  Occupational History   Not on file  Tobacco Use   Smoking status: Every Day    Packs/day: .5    Types: Cigarettes   Smokeless tobacco: Never  Vaping Use   Vaping Use: Never used  Substance and Sexual Activity   Alcohol use:  Not Currently    Comment: 1 cocktail 3 weeks ago   Drug use: Not Currently    Types: Cocaine    Comment: states "it's legal though"   Sexual activity: Never  Other Topics Concern   Not on file  Social History Narrative   Not on file   Social Determinants of Health   Financial Resource Strain: Not on file  Food Insecurity: Food Insecurity Present (08/22/2022)   Hunger Vital Sign    Worried About Running Out of Food in the Last Year: Sometimes true    Ran Out of Food in the Last Year: Sometimes true  Transportation Needs: Patient Unable To Answer (08/22/2022)   PRAPARE - Administrator, Civil Service (Medical): Patient unable to answer    Lack of Transportation (Non-Medical): Patient unable to answer  Physical Activity: Not on file  Stress: Not on file  Social Connections: Not on file                       Sleep: Fair  Appetite:  Fair  Current Medications: Current Facility-Administered Medications  Medication Dose Route Frequency Provider Last Rate Last Admin   acetaminophen (TYLENOL) tablet 650 mg  650 mg Oral Q6H PRN [name], DO   650 mg at 09/14/22 1754  alum & mag hydroxide-simeth (MAALOX/MYLANTA) 200-200-20 MG/5ML suspension 30 mL  30 mL Oral Q4H PRN [name], DO   30 mL at 09/13/22 0214   diphenhydrAMINE (BENADRYL) capsule 50 mg  50 mg Oral Q6H PRN [name], DO   50 mg at 09/15/22 2107   Or   diphenhydrAMINE (BENADRYL) injection 50 mg  50 mg Intramuscular Q6H PRN [name], DO       haloperidol (HALDOL) tablet 5 mg  5 mg Oral Q6H PRN [name], DO   5 mg at 09/15/22 2107   Or   haloperidol lactate (HALDOL) injection 5 mg  5 mg Intramuscular Q6H PRN [name], DO       ibuprofen (ADVIL) tablet 600 mg  600 mg Oral Q6H PRN [name], [name], MD   600 mg at 09/15/22 1810   LORazepam (ATIVAN) tablet 1 mg  1 mg Oral TID PRN [name], DO   1 mg at 09/15/22  2107   magnesium hydroxide (MILK OF MAGNESIA) suspension 30 mL  30 mL Oral Daily PRN [name], DO       paliperidone (INVEGA) 24 hr tablet 9 mg  9 mg Oral QHS [name], [name], MD   9 mg at 09/16/22 2130   traZODone (DESYREL) tablet 50 mg  50 mg Oral QHS PRN [name], DO   50 mg at 09/16/22 2130    Lab Results: No results found for this or any previous visit (from the past 48 hour(s)).  Blood Alcohol level:  Lab Results  Component Value Date   ETH <10 08/17/2022   ETH <10 01/11/2021    Metabolic Disorder Labs: No results found for: "HGBA1C", "MPG" No results found for: "PROLACTIN" No results found for: "CHOL", "TRIG", "HDL", "CHOLHDL", "VLDL", "LDLCALC"  Psychiatric Specialty Exam:  Presentation  General Appearance:  Casual; Neat  Eye Contact: Fair  Speech: Garbled at baseline  Speech Volume: Normal Handedness: Right   Mood and Affect  Mood: " Good"  Affect: Stable   Thought Process  Thought Processes: Improved, goal directed for most of the session, at times have flight of ideas  Descriptions of Associations: At times loose Orientation:Partial  Thought Content: Denies suicidal or homicidal ideation , denies  AVH, patient is observed talking to self in her room and responding to internal stimuli  Ideas of Reference:Paranoia  Suicidal Thoughts: Denies Homicidal Thoughts: Denies  Sensorium  Memory: Remote memory impaired  Judgment: Fair  Insight: Advertising copywriter  Concentration: Poor  Attention Span: Fair  Recall: Impaired  Language: Fair   Psychomotor Activity  Psychomotor Activity:No data recorded  Assets  Assets: Communication Skills    Physical Exam:  Review of Systems  Constitutional: Negative.   HENT: Negative.    Eyes: Negative.   Respiratory: Negative.    Cardiovascular: Negative.   Gastrointestinal: Negative.   Musculoskeletal: Negative.   Skin: Negative.    Neurological: Negative.   Psychiatric/Behavioral:  Positive for memory loss. Negative for suicidal ideas.    Blood pressure 115/68, pulse 82, temperature (!) 97.3 F (36.3 C), resp. rate 18, height 5\' 6"  (1.676 m), weight 64 kg, SpO2 100 %. Body mass index is 22.76 kg/m.   Treatment Plan Summary: Plan no change to medication.  Supportive counseling done.  Patient awaiting appropriate placement .  Social worker consulted to get collateral from family.  6/25 No changes  Reggie Pile, MD 09/17/2022, 1:48 PM

## 2022-09-18 DIAGNOSIS — F250 Schizoaffective disorder, bipolar type: Secondary | ICD-10-CM | POA: Diagnosis not present

## 2022-09-18 NOTE — Progress Notes (Signed)
   09/18/22 1000  Psych Admission Type (Psych Patients Only)  Admission Status Voluntary  Psychosocial Assessment  Patient Complaints None  Eye Contact Fair  Facial Expression Blank  Affect Appropriate to circumstance  Speech Slurred  Interaction Assertive  Motor Activity Slow  Appearance/Hygiene Unremarkable  Behavior Characteristics Appropriate to situation  Mood Pleasant  Thought Process  Coherency WDL  Content WDL  Delusions WDL  Perception Hallucinations  Hallucination Auditory;Visual  Judgment Impaired  Confusion Mild  Danger to Self  Current suicidal ideation? Denies  Danger to Others  Danger to Others None reported or observed

## 2022-09-18 NOTE — Progress Notes (Signed)
[facility] MD Progress Note  09/18/2022 1:28 PM Betty Henderson  MRN:  [id number] Subjective: Betty Henderson is seen on rounds.  This is my first meeting with her.  She has a long history of schizoaffective disorder and noncompliance with medications.  She is currently homeless I believe.  She is on Invega 9 mg at bedtime.  She has no complaints today.  She denies any auditory or visual hallucinations.  She denies any suicidal ideation. Principal Problem: Schizoaffective disorder, bipolar type (HCC) Diagnosis: Principal Problem:   Schizoaffective disorder, bipolar type (HCC)  Total Time spent with patient: 15 minutes  Past Psychiatric History: She has 1 psychiatric admission back in 2020 at [facility].  She has been on [place] 234 in the past.  She has been on Zyprexa, Haldol, Risperdal, and Abilify.  She was supposed to follow-up at [facility].   Past Medical History:  Past Medical History:  Diagnosis Date   Anemia    Arthritis    Chronic pain    Drug-seeking behavior    Malingering    Osteopetrosis    Psychosis (HCC)    Schizoaffective disorder, bipolar type (HCC)     Past Surgical History:  Procedure Laterality Date   MOUTH SURGERY     TUBAL LIGATION     Family History:  Family History  Family history unknown: Yes   Family Psychiatric  History: Unremarkable Social History:  Social History   Substance and Sexual Activity  Alcohol Use Not Currently   Comment: 1 cocktail 3 weeks ago     Social History   Substance and Sexual Activity  Drug Use Not Currently   Types: Cocaine   Comment: states "it's legal though"    Social History   Socioeconomic History   Marital status: Single    Spouse name: Not on file   Number of children: Not on file   Years of education: Not on file   Highest education level: Not on file  Occupational History   Not on file  Tobacco Use   Smoking status: Every Day    Packs/day: .5    Types: Cigarettes   Smokeless tobacco:  Never  Vaping Use   Vaping Use: Never used  Substance and Sexual Activity   Alcohol use: Not Currently    Comment: 1 cocktail 3 weeks ago   Drug use: Not Currently    Types: Cocaine    Comment: states "it's legal though"   Sexual activity: Never  Other Topics Concern   Not on file  Social History Narrative   Not on file   Social Determinants of Health   Financial Resource Strain: Not on file  Food Insecurity: Food Insecurity Present (08/22/2022)   Hunger Vital Sign    Worried About Running Out of Food in the Last Year: Sometimes true    Ran Out of Food in the Last Year: Sometimes true  Transportation Needs: Patient Unable To Answer (08/22/2022)   PRAPARE - Administrator, Civil Service (Medical): Patient unable to answer    Lack of Transportation (Non-Medical): Patient unable to answer  Physical Activity: Not on file  Stress: Not on file  Social Connections: Not on file   Additional Social History:                         Sleep: Good  Appetite:  Good  Current Medications: Current Facility-Administered Medications  Medication Dose Route Frequency Provider Last Rate Last Admin  acetaminophen (TYLENOL) tablet 650 mg  650 mg Oral Q6H PRN [name], DO   650 mg at 09/14/22 1754   alum & mag hydroxide-simeth (MAALOX/MYLANTA) 200-200-20 MG/5ML suspension 30 mL  30 mL Oral Q4H PRN [name], DO   30 mL at 09/13/22 0214   diphenhydrAMINE (BENADRYL) capsule 50 mg  50 mg Oral Q6H PRN [name], DO   50 mg at 09/17/22 1727   Or   diphenhydrAMINE (BENADRYL) injection 50 mg  50 mg Intramuscular Q6H PRN [name], DO       haloperidol (HALDOL) tablet 5 mg  5 mg Oral Q6H PRN [name], DO   5 mg at 09/17/22 1727   Or   haloperidol lactate (HALDOL) injection 5 mg  5 mg Intramuscular Q6H PRN [name], DO       ibuprofen (ADVIL) tablet 600 mg  600 mg Oral Q6H PRN [name], [name], MD    600 mg at 09/15/22 1810   LORazepam (ATIVAN) tablet 1 mg  1 mg Oral TID PRN [name], DO   1 mg at 09/17/22 1727   magnesium hydroxide (MILK OF MAGNESIA) suspension 30 mL  30 mL Oral Daily PRN [name], DO       paliperidone (INVEGA) 24 hr tablet 9 mg  9 mg Oral QHS [name], [name], MD   9 mg at 09/17/22 2124   traZODone (DESYREL) tablet 50 mg  50 mg Oral QHS PRN [name], DO   50 mg at 09/17/22 2124    Lab Results: No results found for this or any previous visit (from the past 48 hour(s)).  Blood Alcohol level:  Lab Results  Component Value Date   ETH <10 08/17/2022   ETH <10 01/11/2021    Metabolic Disorder Labs: No results found for: "HGBA1C", "MPG" No results found for: "PROLACTIN" No results found for: "CHOL", "TRIG", "HDL", "CHOLHDL", "VLDL", "LDLCALC"  Physical Findings: AIMS:  , ,  ,  ,    CIWA:    COWS:     Musculoskeletal: Strength & Muscle Tone: within normal limits Gait & Station: normal Patient leans: N/A  Psychiatric Specialty Exam:  Presentation  General Appearance:  Casual; Neat  Eye Contact: Fair  Speech: Slow; Slurred  Speech Volume: Decreased  Handedness: Right   Mood and Affect  Mood: Anxious  Affect: Inappropriate   Thought Process  Thought Processes: Disorganized  Descriptions of Associations:Loose  Orientation:Partial  Thought Content:Illogical; Rumination  History of Schizophrenia/Schizoaffective disorder:No data recorded Duration of Psychotic Symptoms:No data recorded Hallucinations:No data recorded Ideas of Reference:Paranoia  Suicidal Thoughts:No data recorded Homicidal Thoughts:No data recorded  Sensorium  Memory: Immediate Fair; Remote Poor  Judgment: Poor  Insight: Poor   Executive Functions  Concentration: Poor  Attention Span: Fair  Recall: [employer] of Knowledge: Fair  Language: Fair   Psychomotor Activity  Psychomotor Activity:No data  recorded  Assets  Assets: Communication Skills   Sleep  Sleep:No data recorded   Physical Exam: Physical Exam Vitals and nursing note reviewed.  Constitutional:      Appearance: Normal appearance. She is normal weight.  Neurological:     General: No focal deficit present.     Mental Status: She is alert and oriented to person, place, and time.  Psychiatric:        Attention and Perception: Attention and perception normal.        Mood and Affect: Mood and affect normal.  Speech: Speech normal.        Behavior: Behavior normal. Behavior is cooperative.        Thought Content: Thought content normal.        Cognition and Memory: Cognition and memory normal.        Judgment: Judgment normal.    Review of Systems  Constitutional: Negative.   HENT: Negative.    Eyes: Negative.   Respiratory: Negative.    Cardiovascular: Negative.   Gastrointestinal: Negative.   Genitourinary: Negative.   Musculoskeletal: Negative.   Skin: Negative.   Neurological: Negative.   Endo/Heme/Allergies: Negative.   Psychiatric/Behavioral: Negative.     Blood pressure 106/68, pulse 87, temperature 98.7 F (37.1 C), temperature source Oral, resp. rate 18, height 5\' 6"  (1.676 m), weight 64 kg, SpO2 100 %. Body mass index is 22.76 kg/m.   Treatment Plan Summary: Daily contact with patient to assess and evaluate symptoms and progress in treatment, Medication management, and Plan continue current medications.  Consider Sustenna long-acting injection.  Mouhamadou Gittleman Tresea Mall, DO 09/18/2022, 1:28 PM

## 2022-09-18 NOTE — Group Note (Signed)
Date:  09/18/2022 Time:  7:03 AM  Group Topic/Focus:  Building Self Esteem:   The Focus of this group is helping patients become aware of the effects of self-esteem on their lives, the things they and others do that enhance or undermine their self-esteem, seeing the relationship between their level of self-esteem and the choices they make and learning ways to enhance self-esteem. Coping With Mental Health Crisis:   The purpose of this group is to help patients identify strategies for coping with mental health crisis.  Group discusses possible causes of crisis and ways to manage them effectively. Developing a Wellness Toolbox:   The focus of this group is to help patients develop a "wellness toolbox" with skills and strategies to promote recovery upon discharge. Healthy Communication:   The focus of this group is to discuss communication, barriers to communication, as well as healthy ways to communicate with others. Identifying Needs:   The focus of this group is to help patients identify their personal needs that have been historically problematic and identify healthy behaviors to address their needs. Making Healthy Choices:   The focus of this group is to help patients identify negative/unhealthy choices they were using prior to admission and identify positive/healthier coping strategies to replace them upon discharge. Managing Feelings:   The focus of this group is to identify what feelings patients have difficulty handling and develop a plan to handle them in a healthier way upon discharge.    Participation Level:  Minimal  Participation Quality:  Inattentive  Affect:  Anxious  Cognitive:  Appropriate  Insight: Lacking  Engagement in Group:  Improving  Modes of Intervention:  Discussion  Additional Comments:    [name] 09/18/2022, 7:03 AM

## 2022-09-18 NOTE — Progress Notes (Signed)
   09/18/22 2000  Psych Admission Type (Psych Patients Only)  Admission Status Voluntary  Psychosocial Assessment  Patient Complaints None  Eye Contact Fair  Facial Expression Flat  Affect Appropriate to circumstance  Speech Soft;Slurred  Interaction Assertive  Motor Activity Slow  Appearance/Hygiene Unremarkable  Behavior Characteristics Appropriate to situation  Mood Pleasant  Thought Process  Coherency WDL  Content WDL  Delusions WDL  Perception Hallucinations  Hallucination Auditory  Judgment Impaired  Confusion Mild  Danger to Self  Current suicidal ideation? Denies  Danger to Others  Danger to Others None reported or observed

## 2022-09-18 NOTE — Progress Notes (Signed)
Patient had no new behavioral issues thus far on the shift, she was cooperative with treatment and compliant with medication regime. She denies SI, HI & AVH. No noted incidences of the patient responding to internal stimuli on shift thus far.

## 2022-09-18 NOTE — Group Note (Signed)
Date:  09/18/2022 Time:  10:03 AM  Group Topic/Focus:  Ice breaker & morning movement     This morning we introduced ourselves with a icebreaker then proceeded to talk about community & personal expectations while being on the [facility] unit. Afterwards we proceeded to go outside for some fresh air while stretching, listening to music and play games.     Participation Level:  Active  Participation Quality:  Appropriate, Attentive, Sharing, and Supportive  Affect:  Appropriate  Cognitive:  Alert, Appropriate, and Oriented  Insight: Appropriate and Good  Engagement in Group:  Engaged and Supportive  Modes of Intervention:  Activity  Additional Comments:     Alexis Frock 09/18/2022, 10:03 AM

## 2022-09-18 NOTE — Plan of Care (Signed)
  Problem: Nutrition: Goal: Adequate nutrition will be maintained Outcome: Progressing   Problem: Coping: Goal: Level of anxiety will decrease Outcome: Progressing   Problem: Elimination: Goal: Will not experience complications related to bowel motility Outcome: Progressing Goal: Will not experience complications related to urinary retention Outcome: Progressing   

## 2022-09-18 NOTE — BHH Counselor (Signed)
CSW referred patient to [facility] [employer] for assistance with disability and Medicaid.  Penni Homans, MSW, LCSW 09/18/2022 11:54 AM

## 2022-09-19 DIAGNOSIS — F250 Schizoaffective disorder, bipolar type: Secondary | ICD-10-CM | POA: Diagnosis not present

## 2022-09-19 NOTE — Group Note (Signed)
Date:  09/19/2022 Time:  10:22 AM  Group Topic/Focus:  Crisis Planning:   The purpose of this group is to help patients create a crisis plan for use upon discharge or in the future, as needed.    Participation Level:  Minimal  Participation Quality:  Redirectable  Affect:  Appropriate  Cognitive:  Appropriate and Confused  Insight: Good and Improving  Engagement in Group:  Improving  Modes of Intervention:  Activity and Discussion  Additional Comments:    [name] 09/19/2022, 10:22 AM

## 2022-09-19 NOTE — Progress Notes (Signed)
   09/19/22 0600  15 Minute Checks  Location Bedroom  Visual Appearance Calm  Behavior Sleeping  Sleep (Behavioral Health Patients Only)  Calculate sleep? (Click Yes once per 24 hr at 0600 safety check) Yes  Documented sleep last 24 hours 8

## 2022-09-19 NOTE — BHH Counselor (Signed)
CSW assisted patient in meeting with [name] with Financial Counseling to assist with Medicaid and disability applications.   Penni Homans, MSW, LCSW 09/19/2022 12:50 PM

## 2022-09-19 NOTE — Progress Notes (Signed)
   09/19/22 2200  Psych Admission Type (Psych Patients Only)  Admission Status Voluntary  Psychosocial Assessment  Patient Complaints None  Eye Contact Fair  Facial Expression Flat  Affect Appropriate to circumstance  Speech Slurred;Soft  Interaction Assertive  Motor Activity Slow  Appearance/Hygiene Unremarkable  Behavior Characteristics Appropriate to situation  Mood Pleasant  Thought Process  Coherency WDL  Content WDL  Delusions WDL  Perception Hallucinations  Hallucination Auditory  Judgment Impaired  Confusion Mild  Danger to Self  Current suicidal ideation? Denies  Danger to Others  Danger to Others None reported or observed

## 2022-09-19 NOTE — Group Note (Signed)
Recreation Therapy Group Note   Group Topic:Health and Wellness  Group Date: 09/19/2022 Start Time: 1400 End Time: 1455 Facilitators: Rosina Lowenstein, LRT, CTRS Location:  Day Room  Group Description: Seated Exercise. LRT discussed the mental and physical benefits of exercise. LRT and group discussed how physical activity can be used as a coping skill. Pt's and LRT followed along to an exercise video on the TV screen that provided a visual representation and audio description of every exercise performed. Pt's encouraged to listen to their bodies and stop at any time if they experience feelings of discomfort or pain. LRT passed out water after session was over and encouraged pts do drink and stay hydrated.  Goal Area(s) Addressed: Patient will learn benefits of physical activity. Patient will identify exercise as a coping skill.  Patient will follow multistep directions. Patient will try a new leisure interest.   Affect/Mood: Full range   Participation Level: Minimal   Participation Quality: Minimal Cues   Behavior: Distracted   Speech/Thought Process: Flight of ideas   Insight: Poor   Judgement: Poor   Modes of Intervention: Activity   Patient Response to Interventions:  Receptive   Education Outcome:  In group clarification offered    Clinical Observations/Individualized Feedback: Betty Henderson was minimally active in their participation of session activities and group discussion. Pt completed very few of the encouraged exercises. Pt randomly started singing very loudly during group while peers were trying to listen to the directions from the TV on how to complete the exercises. Pt required redirection from LRT to be quiet and pt rolled her eyes and stopped singing.Pt was calm and quiet the rest of group.    Plan: Continue to engage patient in RT group sessions 2-3x/week.   Rosina Lowenstein, LRT, CTRS 09/19/2022 3:44 PM

## 2022-09-19 NOTE — BHH Counselor (Signed)
CSW was present as patient met with Chantrelle and Christiane Ha from [facility] DSS.   DSS completed a screening for the patient, assessing mental status and overall ability to process.    DSS stated concerns at end that patient would not be able to care for herself.   CSW inquired about next steps and DSS stated that they were assisting [facility].  They will reach out to [facility] with the results of their assessment, the [facility] worker will reach out to this Clinical research associate within 30 days on status of the report.    [place] DSS worker is unknown at this time.   Penni Homans, MSW, LCSW 09/19/2022 4:45 PM

## 2022-09-19 NOTE — Progress Notes (Signed)
Patient is A+O x 3. Affect and mood appropriate. She denies SI/HI/AVH. She denies anxiety and depression. Appetite good.   Tylenol 650 mg adm at 1025 for 8/10 back pain. Upon follow up, pain decreased to a 2. Patient was assisted by Child psychotherapist in a meeting for financial counseling.  Q15 minute unit checks in place.

## 2022-09-19 NOTE — Progress Notes (Signed)
[facility] MD Progress Note  09/19/2022 2:19 PM Betty Henderson  MRN:  [id number] Subjective: Betty Henderson is seen on rounds.  She is doing well on her Invega 9 mg at bedtime.  She denies any side effects.  There is no evidence of EPS or TD.  She states that she went to live her parents house.  This will be discussed with social work.  She has been pleasant and cooperative and good controls on the unit. Principal Problem: Schizoaffective disorder, bipolar type (HCC) Diagnosis: Principal Problem:   Schizoaffective disorder, bipolar type (HCC)  Total Time spent with patient: 15 minutes  Past Psychiatric History: Schizoaffective disorder Past Medical History:  Past Medical History:  Diagnosis Date   Anemia    Arthritis    Chronic pain    Drug-seeking behavior    Malingering    Osteopetrosis    Psychosis (HCC)    Schizoaffective disorder, bipolar type (HCC)     Past Surgical History:  Procedure Laterality Date   MOUTH SURGERY     TUBAL LIGATION     Family History:  Family History  Family history unknown: Yes   Family Psychiatric  History: Unremarkable Social History:  Social History   Substance and Sexual Activity  Alcohol Use Not Currently   Comment: 1 cocktail 3 weeks ago     Social History   Substance and Sexual Activity  Drug Use Not Currently   Types: Cocaine   Comment: states "it's legal though"    Social History   Socioeconomic History   Marital status: Single    Spouse name: Not on file   Number of children: Not on file   Years of education: Not on file   Highest education level: Not on file  Occupational History   Not on file  Tobacco Use   Smoking status: Every Day    Packs/day: .5    Types: Cigarettes   Smokeless tobacco: Never  Vaping Use   Vaping Use: Never used  Substance and Sexual Activity   Alcohol use: Not Currently    Comment: 1 cocktail 3 weeks ago   Drug use: Not Currently    Types: Cocaine    Comment: states "it's legal though"   Sexual  activity: Never  Other Topics Concern   Not on file  Social History Narrative   Not on file   Social Determinants of Health   Financial Resource Strain: Not on file  Food Insecurity: Food Insecurity Present (08/22/2022)   Hunger Vital Sign    Worried About Running Out of Food in the Last Year: Sometimes true    Ran Out of Food in the Last Year: Sometimes true  Transportation Needs: Patient Unable To Answer (08/22/2022)   PRAPARE - Administrator, Civil Service (Medical): Patient unable to answer    Lack of Transportation (Non-Medical): Patient unable to answer  Physical Activity: Not on file  Stress: Not on file  Social Connections: Not on file   Additional Social History:                         Sleep: Good  Appetite:  Good  Current Medications: Current Facility-Administered Medications  Medication Dose Route Frequency Provider Last Rate Last Admin   acetaminophen (TYLENOL) tablet 650 mg  650 mg Oral Q6H PRN [name], DO   650 mg at 09/19/22 1025   alum & mag hydroxide-simeth (MAALOX/MYLANTA) 200-200-20 MG/5ML suspension 30 mL  30 mL Oral  Q4H PRN [name], DO   30 mL at 09/13/22 0214   diphenhydrAMINE (BENADRYL) capsule 50 mg  50 mg Oral Q6H PRN [name], DO   50 mg at 09/17/22 1727   Or   diphenhydrAMINE (BENADRYL) injection 50 mg  50 mg Intramuscular Q6H PRN [name], DO       haloperidol (HALDOL) tablet 5 mg  5 mg Oral Q6H PRN [name], DO   5 mg at 09/17/22 1727   Or   haloperidol lactate (HALDOL) injection 5 mg  5 mg Intramuscular Q6H PRN [name], DO       ibuprofen (ADVIL) tablet 600 mg  600 mg Oral Q6H PRN [name], [name], MD   600 mg at 09/15/22 1810   LORazepam (ATIVAN) tablet 1 mg  1 mg Oral TID PRN [name], DO   1 mg at 09/17/22 1727   magnesium hydroxide (MILK OF MAGNESIA) suspension 30 mL  30 mL Oral Daily PRN [name], DO        paliperidone (INVEGA) 24 hr tablet 9 mg  9 mg Oral QHS [name], [name], MD   9 mg at 09/18/22 2120   traZODone (DESYREL) tablet 50 mg  50 mg Oral QHS PRN [name], DO   50 mg at 09/18/22 2120    Lab Results: No results found for this or any previous visit (from the past 48 hour(s)).  Blood Alcohol level:  Lab Results  Component Value Date   ETH <10 08/17/2022   ETH <10 01/11/2021    Metabolic Disorder Labs: No results found for: "HGBA1C", "MPG" No results found for: "PROLACTIN" No results found for: "CHOL", "TRIG", "HDL", "CHOLHDL", "VLDL", "LDLCALC"  Physical Findings: AIMS:  , ,  ,  ,    CIWA:    COWS:     Musculoskeletal: Strength & Muscle Tone: within normal limits Gait & Station: normal Patient leans: N/A  Psychiatric Specialty Exam:  Presentation  General Appearance:  Casual; Neat  Eye Contact: Fair  Speech: Slow; Slurred  Speech Volume: Decreased  Handedness: Right   Mood and Affect  Mood: Anxious  Affect: Inappropriate   Thought Process  Thought Processes: Disorganized  Descriptions of Associations:Loose  Orientation:Partial  Thought Content:Illogical; Rumination  History of Schizophrenia/Schizoaffective disorder:No data recorded Duration of Psychotic Symptoms:No data recorded Hallucinations:No data recorded Ideas of Reference:Paranoia  Suicidal Thoughts:No data recorded Homicidal Thoughts:No data recorded  Sensorium  Memory: Immediate Fair; Remote Poor  Judgment: Poor  Insight: Poor   Executive Functions  Concentration: Poor  Attention Span: Fair  Recall: [employer] of Knowledge: Fair  Language: Fair   Psychomotor Activity  Psychomotor Activity:No data recorded  Assets  Assets: Communication Skills   Sleep  Sleep:No data recorded    Blood pressure (!) 111/55, pulse 71, temperature 98.2 F (36.8 C), resp. rate 16, height 5\' 6"  (1.676 m), weight 64 kg, SpO2 (!) 88 %. Body  mass index is 22.76 kg/m.   Treatment Plan Summary: Daily contact with patient to assess and evaluate symptoms and progress in treatment, Medication management, and Plan continue current medications.  [name], DO 09/19/2022, 2:19 PM

## 2022-09-19 NOTE — Plan of Care (Signed)
  Problem: Nutrition: Goal: Adequate nutrition will be maintained Outcome: Progressing   Problem: Elimination: Goal: Will not experience complications related to bowel motility Outcome: Progressing Goal: Will not experience complications related to urinary retention Outcome: Progressing   

## 2022-09-19 NOTE — Group Note (Signed)
Date:  09/19/2022 Time:  10:22 PM  Group Topic/Focus:  Crisis Planning:   The purpose of this group is to help patients create a crisis plan for use upon discharge or in the future, as needed.    Participation Level:  Did Not Attend  Participation Quality:   Did Not Attend  Affect:   Did Not Attend  Cognitive:   Did Not Attend  Insight: None  Engagement in Group:  None  Modes of Intervention:   Did Not Attend  Additional Comments:    [name] 09/19/2022, 10:22 PM

## 2022-09-20 DIAGNOSIS — F250 Schizoaffective disorder, bipolar type: Secondary | ICD-10-CM | POA: Diagnosis not present

## 2022-09-20 NOTE — Group Note (Signed)
Recreation Therapy Group Note   Group Topic:Leisure Education  Group Date: 09/20/2022 Start Time: 1400 End Time: 1455 Facilitators: Rosina Lowenstein, LRT, CTRS Location:  Dayroom  Group Description: Leisure. Patients were given the option to choose from coloring, singing karaoke, or playing cards. LRT and pts discussed the meaning of leisure, the importance of participating in leisure during their free time/when they're outside of the hospital, as well as how our leisure interests can also serve as coping skills. Pt identified two leisure interests and shared with the group.   Goal Area(s) Addressed:  Patient will identify a current leisure interest.  Patient will learn the definition of "leisure". Patient will practice making a positive decision. Patient will have the opportunity to try a new leisure activity. Patient will communicate with peers and LRT.    Affect/Mood: Appropriate   Participation Level: Active and Engaged   Participation Quality: Independent   Behavior: Calm and Cooperative   Speech/Thought Process: Coherent   Insight: Good   Judgement: Good   Modes of Intervention: Activity   Patient Response to Interventions:  Receptive   Education Outcome:  Acknowledges education   Clinical Observations/Individualized Feedback: Betty Henderson was active in their participation of session activities and group discussion. Pt identified "eat and sleep" as things that she does in her free time. Pt chose to sing karaoke while in group. Pt interacted well with LRT and peers duration of session.   Plan: Continue to engage patient in RT group sessions 2-3x/week.   Rosina Lowenstein, LRT, CTRS 09/20/2022 3:48 PM

## 2022-09-20 NOTE — Group Note (Signed)
Date:  09/20/2022 Time:  11:14 AM  Group Topic/Focus:  Goals Group:   The focus of this group is to help patients establish daily goals to achieve during treatment and discuss how the patient can incorporate goal setting into their daily lives to aide in recovery.    Participation Level:  Active  Participation Quality:  Appropriate  Affect:  Appropriate  Cognitive:  Appropriate  Insight: Appropriate  Engagement in Group:  Engaged  Modes of Intervention:  Discussion  Additional Comments:  None  Rodena Goldmann 09/20/2022, 11:14 AM

## 2022-09-20 NOTE — Plan of Care (Signed)
  Problem: Education: Goal: Knowledge of General Education information will improve Description: Including pain rating scale, medication(s)/side effects and non-pharmacologic comfort measures Outcome: Progressing   Problem: Health Behavior/Discharge Planning: Goal: Ability to manage health-related needs will improve Outcome: Progressing   Problem: Clinical Measurements: Goal: Ability to maintain clinical measurements within normal limits will improve Outcome: Progressing Goal: Diagnostic test results will improve Outcome: Progressing   Problem: Nutrition: Goal: Adequate nutrition will be maintained Outcome: Progressing   Problem: Coping: Goal: Level of anxiety will decrease Outcome: Progressing   Problem: Elimination: Goal: Will not experience complications related to bowel motility Outcome: Progressing Goal: Will not experience complications related to urinary retention Outcome: Progressing   

## 2022-09-20 NOTE — Progress Notes (Signed)
[facility] MD Progress Note  09/20/2022 2:58 PM Betty Henderson  MRN:  [id number] Subjective: Betty Henderson was seen on rounds.  She has been compliant with medications.  She is still having delusional thoughts.  No side effects from her medications.  She has been in good controls and cooperative on the unit.  DSS is involved in her case and she has a placement issue. Principal Problem: Schizoaffective disorder, bipolar type (HCC) Diagnosis: Principal Problem:   Schizoaffective disorder, bipolar type (HCC)  Total Time spent with patient: 15 minutes  Past Psychiatric History: Schizoaffective disorder  Past Medical History:  Past Medical History:  Diagnosis Date   Anemia    Arthritis    Chronic pain    Drug-seeking behavior    Malingering    Osteopetrosis    Psychosis (HCC)    Schizoaffective disorder, bipolar type (HCC)     Past Surgical History:  Procedure Laterality Date   MOUTH SURGERY     TUBAL LIGATION     Family History:  Family History  Family history unknown: Yes   Family Psychiatric  History: Unremarkable Social History:  Social History   Substance and Sexual Activity  Alcohol Use Not Currently   Comment: 1 cocktail 3 weeks ago     Social History   Substance and Sexual Activity  Drug Use Not Currently   Types: Cocaine   Comment: states "it's legal though"    Social History   Socioeconomic History   Marital status: Single    Spouse name: Not on file   Number of children: Not on file   Years of education: Not on file   Highest education level: Not on file  Occupational History   Not on file  Tobacco Use   Smoking status: Every Day    Packs/day: .5    Types: Cigarettes   Smokeless tobacco: Never  Vaping Use   Vaping Use: Never used  Substance and Sexual Activity   Alcohol use: Not Currently    Comment: 1 cocktail 3 weeks ago   Drug use: Not Currently    Types: Cocaine    Comment: states "it's legal though"   Sexual activity: Never  Other Topics Concern    Not on file  Social History Narrative   Not on file   Social Determinants of Health   Financial Resource Strain: Not on file  Food Insecurity: Food Insecurity Present (08/22/2022)   Hunger Vital Sign    Worried About Running Out of Food in the Last Year: Sometimes true    Ran Out of Food in the Last Year: Sometimes true  Transportation Needs: Patient Unable To Answer (08/22/2022)   PRAPARE - Administrator, Civil Service (Medical): Patient unable to answer    Lack of Transportation (Non-Medical): Patient unable to answer  Physical Activity: Not on file  Stress: Not on file  Social Connections: Not on file   Additional Social History:                         Sleep: Good  Appetite:  Good  Current Medications: Current Facility-Administered Medications  Medication Dose Route Frequency Provider Last Rate Last Admin   acetaminophen (TYLENOL) tablet 650 mg  650 mg Oral Q6H PRN [name], DO   650 mg at 09/19/22 1939   alum & mag hydroxide-simeth (MAALOX/MYLANTA) 200-200-20 MG/5ML suspension 30 mL  30 mL Oral Q4H PRN [name], DO   30 mL at 09/20/22  1142   diphenhydrAMINE (BENADRYL) capsule 50 mg  50 mg Oral Q6H PRN [name], DO   50 mg at 09/17/22 1727   Or   diphenhydrAMINE (BENADRYL) injection 50 mg  50 mg Intramuscular Q6H PRN [name], DO       haloperidol (HALDOL) tablet 5 mg  5 mg Oral Q6H PRN [name], DO   5 mg at 09/17/22 1727   Or   haloperidol lactate (HALDOL) injection 5 mg  5 mg Intramuscular Q6H PRN [name], DO       ibuprofen (ADVIL) tablet 600 mg  600 mg Oral Q6H PRN [name], [name], MD   600 mg at 09/15/22 1810   LORazepam (ATIVAN) tablet 1 mg  1 mg Oral TID PRN [name], DO   1 mg at 09/17/22 1727   magnesium hydroxide (MILK OF MAGNESIA) suspension 30 mL  30 mL Oral Daily PRN [name], DO       paliperidone (INVEGA) 24 hr  tablet 9 mg  9 mg Oral QHS [name], [name], MD   9 mg at 09/19/22 2135   traZODone (DESYREL) tablet 50 mg  50 mg Oral QHS PRN [name], DO   50 mg at 09/19/22 2135    Lab Results: No results found for this or any previous visit (from the past 48 hour(s)).  Blood Alcohol level:  Lab Results  Component Value Date   ETH <10 08/17/2022   ETH <10 01/11/2021    Metabolic Disorder Labs: No results found for: "HGBA1C", "MPG" No results found for: "PROLACTIN" No results found for: "CHOL", "TRIG", "HDL", "CHOLHDL", "VLDL", "LDLCALC"  Physical Findings: AIMS:  , ,  ,  ,    CIWA:    COWS:     Musculoskeletal: Strength & Muscle Tone: within normal limits Gait & Station: normal Patient leans: N/A  Psychiatric Specialty Exam:  Presentation  General Appearance:  Casual; Neat  Eye Contact: Fair  Speech: Slow; Slurred  Speech Volume: Decreased  Handedness: Right   Mood and Affect  Mood: Anxious  Affect: Inappropriate   Thought Process  Thought Processes: Disorganized  Descriptions of Associations:Loose  Orientation:Partial  Thought Content:Illogical; Rumination  History of Schizophrenia/Schizoaffective disorder:No data recorded Duration of Psychotic Symptoms:No data recorded Hallucinations:No data recorded Ideas of Reference:Paranoia  Suicidal Thoughts:No data recorded Homicidal Thoughts:No data recorded  Sensorium  Memory: Immediate Fair; Remote Poor  Judgment: Poor  Insight: Poor   Executive Functions  Concentration: Poor  Attention Span: Fair  Recall: [employer] of Knowledge: Fair  Language: Fair   Psychomotor Activity  Psychomotor Activity:No data recorded  Assets  Assets: Communication Skills   Sleep  Sleep:No data recorded    Blood pressure 95/73, pulse 80, temperature 98.4 F (36.9 C), resp. rate 16, height 5\' 6"  (1.676 m), weight 64 kg, SpO2 99 %. Body mass index is 22.76 kg/m.   Treatment Plan  Summary: Daily contact with patient to assess and evaluate symptoms and progress in treatment, Medication management, and Plan continue current medications.  [name], DO 09/20/2022, 2:58 PM

## 2022-09-20 NOTE — Progress Notes (Signed)
   09/20/22 0733  Psych Admission Type (Psych Patients Only)  Admission Status Voluntary  Psychosocial Assessment  Patient Complaints None  Eye Contact Fair  Facial Expression Flat  Affect Appropriate to circumstance  Speech Slurred  Interaction Assertive  Motor Activity Slow  Appearance/Hygiene Unremarkable  Behavior Characteristics Calm  Mood Pleasant  Thought Process  Coherency WDL  Content WDL  Delusions None reported or observed  Perception WDL  Hallucination Auditory;Visual  Judgment Impaired  Confusion Mild  Danger to Self  Current suicidal ideation? Denies  Danger to Others  Danger to Others None reported or observed

## 2022-09-20 NOTE — Progress Notes (Signed)
   09/20/22 0733  Psych Admission Type (Psych Patients Only)  Admission Status Voluntary  Psychosocial Assessment  Patient Complaints None  Eye Contact Fair  Facial Expression Flat  Affect Appropriate to circumstance  Speech Slurred  Interaction Assertive  Motor Activity Slow  Appearance/Hygiene Unremarkable  Behavior Characteristics Calm  Mood Pleasant  Thought Process  Coherency WDL  Content WDL  Delusions None reported or observed  Perception WDL  Hallucination Auditory;Visual  Judgment Impaired  Confusion Mild  Danger to Self  Current suicidal ideation? Denies  Danger to Others  Danger to Others None reported or observed    

## 2022-09-20 NOTE — Progress Notes (Signed)
   09/20/22 0600  15 Minute Checks  Location Bedroom  Visual Appearance Calm  Behavior Sleeping  Sleep (Behavioral Health Patients Only)  Calculate sleep? (Click Yes once per 24 hr at 0600 safety check) Yes  Documented sleep last 24 hours 6.5

## 2022-09-21 DIAGNOSIS — F250 Schizoaffective disorder, bipolar type: Secondary | ICD-10-CM | POA: Diagnosis not present

## 2022-09-21 NOTE — Progress Notes (Signed)
   09/21/22 0900  Psych Admission Type (Psych Patients Only)  Admission Status Voluntary  Psychosocial Assessment  Patient Complaints None  Eye Contact Fair  Facial Expression Flat  Affect Appropriate to circumstance  Speech Slurred  Interaction Assertive  Motor Activity Slow  Appearance/Hygiene Other (Comment) (appropriate)  Behavior Characteristics Cooperative  Mood Preoccupied  Thought Process  Coherency WDL  Content WDL  Delusions None reported or observed  Perception WDL  Hallucination Auditory;Visual  Judgment Impaired  Confusion Mild  Danger to Self  Current suicidal ideation? Denies  Danger to Others  Danger to Others None reported or observed  Danger to Others Abnormal  Harmful Behavior to others No threats or harm toward other people

## 2022-09-21 NOTE — Group Note (Signed)
Date:  09/21/2022 Time:  12:25 AM  Group Topic/Focus:  Building Self Esteem:   The Focus of this group is helping patients become aware of the effects of self-esteem on their lives, the things they and others do that enhance or undermine their self-esteem, seeing the relationship between their level of self-esteem and the choices they make and learning ways to enhance self-esteem. Coping With Mental Health Crisis:   The purpose of this group is to help patients identify strategies for coping with mental health crisis.  Group discusses possible causes of crisis and ways to manage them effectively. Developing a Wellness Toolbox:   The focus of this group is to help patients develop a "wellness toolbox" with skills and strategies to promote recovery upon discharge. Goals Group:   The focus of this group is to help patients establish daily goals to achieve during treatment and discuss how the patient can incorporate goal setting into their daily lives to aide in recovery. Healthy Communication:   The focus of this group is to discuss communication, barriers to communication, as well as healthy ways to communicate with others. Identifying Needs:   The focus of this group is to help patients identify their personal needs that have been historically problematic and identify healthy behaviors to address their needs. Making Healthy Choices:   The focus of this group is to help patients identify negative/unhealthy choices they were using prior to admission and identify positive/healthier coping strategies to replace them upon discharge. Managing Feelings:   The focus of this group is to identify what feelings patients have difficulty handling and develop a plan to handle them in a healthier way upon discharge. Rediscovering Joy:   The focus of this group is to explore various ways to relieve stress in a positive manner.    Participation Level:  None  Participation Quality:  Inattentive  Affect:   Anxious  Cognitive:  Confused  Insight: Limited  Engagement in Group:  Off Topic  Modes of Intervention:  Discussion  Additional Comments:    Betty Henderson 09/21/2022, 12:25 AM

## 2022-09-21 NOTE — Plan of Care (Signed)
  Problem: Pain Managment: Goal: General experience of comfort will improve Outcome: Progressing   Problem: Safety: Goal: Ability to remain free from injury will improve Outcome: Progressing   Problem: Skin Integrity: Goal: Risk for impaired skin integrity will decrease Outcome: Progressing   

## 2022-09-21 NOTE — Progress Notes (Signed)
[facility] MD Progress Note  09/21/2022 3:15 PM Betty Henderson  MRN:  [id number] Subjective: Betty Maduro is seen on rounds.  She has been compliant with medications.  She denies any side effects.  No evidence of EPS or TD.  She has a placement issue.  She continues to be delusional about her living situation.  She has been in good controls and nurses report no issues. Principal Problem: Schizoaffective disorder, bipolar type (HCC) Diagnosis: Principal Problem:   Schizoaffective disorder, bipolar type (HCC)  Total Time spent with patient: 15 minutes  Past Psychiatric History: Schizoaffective disorder  Past Medical History:  Past Medical History:  Diagnosis Date   Anemia    Arthritis    Chronic pain    Drug-seeking behavior    Malingering    Osteopetrosis    Psychosis (HCC)    Schizoaffective disorder, bipolar type (HCC)     Past Surgical History:  Procedure Laterality Date   MOUTH SURGERY     TUBAL LIGATION     Family History:  Family History  Family history unknown: Yes   Family Psychiatric  History: Unremarkable Social History:  Social History   Substance and Sexual Activity  Alcohol Use Not Currently   Comment: 1 cocktail 3 weeks ago     Social History   Substance and Sexual Activity  Drug Use Not Currently   Types: Cocaine   Comment: states "it's legal though"    Social History   Socioeconomic History   Marital status: Single    Spouse name: Not on file   Number of children: Not on file   Years of education: Not on file   Highest education level: Not on file  Occupational History   Not on file  Tobacco Use   Smoking status: Every Day    Packs/day: .5    Types: Cigarettes   Smokeless tobacco: Never  Vaping Use   Vaping Use: Never used  Substance and Sexual Activity   Alcohol use: Not Currently    Comment: 1 cocktail 3 weeks ago   Drug use: Not Currently    Types: Cocaine    Comment: states "it's legal though"   Sexual activity: Never  Other Topics  Concern   Not on file  Social History Narrative   Not on file   Social Determinants of Health   Financial Resource Strain: Not on file  Food Insecurity: Food Insecurity Present (08/22/2022)   Hunger Vital Sign    Worried About Running Out of Food in the Last Year: Sometimes true    Ran Out of Food in the Last Year: Sometimes true  Transportation Needs: Patient Unable To Answer (08/22/2022)   PRAPARE - Administrator, Civil Service (Medical): Patient unable to answer    Lack of Transportation (Non-Medical): Patient unable to answer  Physical Activity: Not on file  Stress: Not on file  Social Connections: Not on file   Additional Social History:                         Sleep: Good  Appetite:  Good  Current Medications: Current Facility-Administered Medications  Medication Dose Route Frequency Provider Last Rate Last Admin   acetaminophen (TYLENOL) tablet 650 mg  650 mg Oral Q6H PRN [name], DO   650 mg at 09/19/22 1939   alum & mag hydroxide-simeth (MAALOX/MYLANTA) 200-200-20 MG/5ML suspension 30 mL  30 mL Oral Q4H PRN [name], DO   30 mL  at 09/20/22 1142   diphenhydrAMINE (BENADRYL) capsule 50 mg  50 mg Oral Q6H PRN [name], DO   50 mg at 09/17/22 1727   Or   diphenhydrAMINE (BENADRYL) injection 50 mg  50 mg Intramuscular Q6H PRN [name], DO       haloperidol (HALDOL) tablet 5 mg  5 mg Oral Q6H PRN [name], DO   5 mg at 09/17/22 1727   Or   haloperidol lactate (HALDOL) injection 5 mg  5 mg Intramuscular Q6H PRN [name], DO       ibuprofen (ADVIL) tablet 600 mg  600 mg Oral Q6H PRN [name], [name], MD   600 mg at 09/15/22 1810   LORazepam (ATIVAN) tablet 1 mg  1 mg Oral TID PRN [name], DO   1 mg at 09/17/22 1727   magnesium hydroxide (MILK OF MAGNESIA) suspension 30 mL  30 mL Oral Daily PRN [name], DO       paliperidone (INVEGA) 24  hr tablet 9 mg  9 mg Oral QHS [name], [name], MD   9 mg at 09/20/22 2146   traZODone (DESYREL) tablet 50 mg  50 mg Oral QHS PRN [name], DO   50 mg at 09/20/22 2146    Lab Results: No results found for this or any previous visit (from the past 48 hour(s)).  Blood Alcohol level:  Lab Results  Component Value Date   ETH <10 08/17/2022   ETH <10 01/11/2021    Metabolic Disorder Labs: No results found for: "HGBA1C", "MPG" No results found for: "PROLACTIN" No results found for: "CHOL", "TRIG", "HDL", "CHOLHDL", "VLDL", "LDLCALC"  Physical Findings: AIMS:  , ,  ,  ,    CIWA:    COWS:     Musculoskeletal: Strength & Muscle Tone: within normal limits Gait & Station: normal Patient leans: N/A  Psychiatric Specialty Exam:  Presentation  General Appearance:  Casual; Neat  Eye Contact: Fair  Speech: Slow; Slurred  Speech Volume: Decreased  Handedness: Right   Mood and Affect  Mood: Anxious  Affect: Inappropriate   Thought Process  Thought Processes: Disorganized  Descriptions of Associations:Loose  Orientation:Partial  Thought Content:Illogical; Rumination  History of Schizophrenia/Schizoaffective disorder:No data recorded Duration of Psychotic Symptoms:No data recorded Hallucinations:No data recorded Ideas of Reference:Paranoia  Suicidal Thoughts:No data recorded Homicidal Thoughts:No data recorded  Sensorium  Memory: Immediate Fair; Remote Poor  Judgment: Poor  Insight: Poor   Executive Functions  Concentration: Poor  Attention Span: Fair  Recall: [employer] of Knowledge: Fair  Language: Fair   Psychomotor Activity  Psychomotor Activity:No data recorded  Assets  Assets: Communication Skills   Sleep  Sleep:No data recorded    Blood pressure 112/72, pulse 72, temperature 98.6 F (37 C), temperature source Oral, resp. rate 18, height 5\' 6"  (1.676 m), weight 64 kg, SpO2 100 %. Body mass index is  22.76 kg/m.   Treatment Plan Summary: Daily contact with patient to assess and evaluate symptoms and progress in treatment, Medication management, and Plan continue current medications.  [name], DO 09/21/2022, 3:15 PM

## 2022-09-21 NOTE — Plan of Care (Signed)
Calm and cooperative. Flat affect. RIS. Did not participant in group. Po med compliant. Trazodone 50 mg po given PRN for insomnia. Showered and dressed in layers. Pt flood the bathroom floor while taking a shower at 2 am. Bathroom area cleaned and dried. Pt informed of shower hours. States "I haven't taken a shower all day". Support and encouragement provided. Q 15 min checks maintained for safety. Denies SI/HI. No c/o pain/discomfort noted.   Problem: Nutrition: Goal: Adequate nutrition will be maintained Outcome: Progressing   Problem: Coping: Goal: Level of anxiety will decrease Outcome: Progressing   Problem: Elimination: Goal: Will not experience complications related to bowel motility Outcome: Progressing Goal: Will not experience complications related to urinary retention Outcome: Progressing   Problem: Pain Managment: Goal: General experience of comfort will improve Outcome: Progressing   Problem: Safety: Goal: Ability to remain free from injury will improve Outcome: Progressing   Problem: Skin Integrity: Goal: Risk for impaired skin integrity will decrease Outcome: Progressing

## 2022-09-22 DIAGNOSIS — F250 Schizoaffective disorder, bipolar type: Secondary | ICD-10-CM | POA: Diagnosis not present

## 2022-09-22 NOTE — Group Note (Signed)
LCSW Group Therapy Note   Group Date: 09/22/2022 Start Time: 1310 End Time: 1357   Type of Therapy and Topic:  Group Therapy: 7  ways to Reduce Stress  Participation Level:  Did Not Attend    Summary of Patient Progress:  The patient did not attend group.    Marshell Levan, LCSWA 09/22/2022  2:10 PM

## 2022-09-22 NOTE — BH IP Treatment Plan (Signed)
Interdisciplinary Treatment and Diagnostic Plan Update  09/22/2022 Time of Session: 12:48 pm Ilynn Sowder MRN: [id number]  Principal Diagnosis: Schizoaffective disorder, bipolar type [facility])  Secondary Diagnoses: Principal Problem:   Schizoaffective disorder, bipolar type (HCC)   Current Medications:  Current Facility-Administered Medications  Medication Dose Route Frequency Provider Last Rate Last Admin   acetaminophen (TYLENOL) tablet 650 mg  650 mg Oral Q6H PRN [name], DO   650 mg at 09/21/22 1842   alum & mag hydroxide-simeth (MAALOX/MYLANTA) 200-200-20 MG/5ML suspension 30 mL  30 mL Oral Q4H PRN [name], DO   30 mL at 09/20/22 1142   diphenhydrAMINE (BENADRYL) capsule 50 mg  50 mg Oral Q6H PRN [name], DO   50 mg at 09/17/22 1727   Or   diphenhydrAMINE (BENADRYL) injection 50 mg  50 mg Intramuscular Q6H PRN [name], DO       haloperidol (HALDOL) tablet 5 mg  5 mg Oral Q6H PRN [name], DO   5 mg at 09/17/22 1727   Or   haloperidol lactate (HALDOL) injection 5 mg  5 mg Intramuscular Q6H PRN [name], DO       ibuprofen (ADVIL) tablet 600 mg  600 mg Oral Q6H PRN [name], [name], MD   600 mg at 09/15/22 1810   LORazepam (ATIVAN) tablet 1 mg  1 mg Oral TID PRN [name], DO   1 mg at 09/17/22 1727   magnesium hydroxide (MILK OF MAGNESIA) suspension 30 mL  30 mL Oral Daily PRN [name], DO       paliperidone (INVEGA) 24 hr tablet 9 mg  9 mg Oral QHS [name], [name], MD   9 mg at 09/21/22 2133   traZODone (DESYREL) tablet 50 mg  50 mg Oral QHS PRN [name], DO   50 mg at 09/21/22 2133   PTA Medications: Medications Prior to Admission  Medication Sig Dispense Refill Last Dose   ondansetron (ZOFRAN) 4 MG tablet Take 1 tablet (4 mg total) by mouth every 8 (eight) hours as needed for nausea or vomiting. (Patient not taking: Reported on 08/17/2022) 20  tablet 0     Patient Stressors: Financial difficulties   Health problems   Medication change or noncompliance    Patient Strengths: Ability for insight   Treatment Modalities: Medication Management, Group therapy, Case management,  1 to 1 session with clinician, Psychoeducation, Recreational therapy.   Physician Treatment Plan for Primary Diagnosis: Schizoaffective disorder, bipolar type (HCC) Long Term Goal(s): Improvement in symptoms so as ready for discharge   Short Term Goals: Ability to identify changes in lifestyle to reduce recurrence of condition will improve Ability to verbalize feelings will improve Ability to disclose and discuss suicidal ideas Ability to demonstrate self-control will improve Ability to identify and develop effective coping behaviors will improve Ability to maintain clinical measurements within normal limits will improve Compliance with prescribed medications will improve Ability to identify triggers associated with substance abuse/mental health issues will improve  Medication Management: Evaluate patient's response, side effects, and tolerance of medication regimen.  Therapeutic Interventions: 1 to 1 sessions, Unit Group sessions and Medication administration.  Evaluation of Outcomes: Progressing  Physician Treatment Plan for Secondary Diagnosis: Principal Problem:   Schizoaffective disorder, bipolar type (HCC)  Long Term Goal(s): Improvement in symptoms so as ready for discharge   Short Term Goals: Ability to identify changes in lifestyle to reduce recurrence of condition will improve Ability  to verbalize feelings will improve Ability to disclose and discuss suicidal ideas Ability to demonstrate self-control will improve Ability to identify and develop effective coping behaviors will improve Ability to maintain clinical measurements within normal limits will improve Compliance with prescribed medications will improve Ability to identify  triggers associated with substance abuse/mental health issues will improve     Medication Management: Evaluate patient's response, side effects, and tolerance of medication regimen.  Therapeutic Interventions: 1 to 1 sessions, Unit Group sessions and Medication administration.  Evaluation of Outcomes: Progressing   RN Treatment Plan for Primary Diagnosis: Schizoaffective disorder, bipolar type (HCC) Long Term Goal(s): Knowledge of disease and therapeutic regimen to maintain health will improve  Short Term Goals: Ability to remain free from injury will improve, Ability to verbalize frustration and anger appropriately will improve, Ability to demonstrate self-control, Ability to participate in decision making will improve, Ability to verbalize feelings will improve, Ability to disclose and discuss suicidal ideas, Ability to identify and develop effective coping behaviors will improve, and Compliance with prescribed medications will improve  Medication Management: RN will administer medications as ordered by provider, will assess and evaluate patient's response and provide education to patient for prescribed medication. RN will report any adverse and/or side effects to prescribing provider.  Therapeutic Interventions: 1 on 1 counseling sessions, Psychoeducation, Medication administration, Evaluate responses to treatment, Monitor vital signs and CBGs as ordered, Perform/monitor CIWA, COWS, AIMS and Fall Risk screenings as ordered, Perform wound care treatments as ordered.  Evaluation of Outcomes: Progressing   LCSW Treatment Plan for Primary Diagnosis: Schizoaffective disorder, bipolar type (HCC) Long Term Goal(s): Safe transition to appropriate next level of care at discharge, Engage patient in therapeutic group addressing interpersonal concerns.  Short Term Goals: Engage patient in aftercare planning with referrals and resources, Increase social support, Increase ability to appropriately  verbalize feelings, Increase emotional regulation, Facilitate acceptance of mental health diagnosis and concerns, and Increase skills for wellness and recovery  Therapeutic Interventions: Assess for all discharge needs, 1 to 1 time with Social worker, Explore available resources and support systems, Assess for adequacy in community support network, Educate family and significant other(s) on suicide prevention, Complete Psychosocial Assessment, Interpersonal group therapy.  Evaluation of Outcomes: Progressing   Progress in Treatment: Attending groups: No. Participating in groups: No. Taking medication as prescribed: Yes. Toleration medication: Yes. Family/Significant other contact made: No, will contact:  patient declined Patient understands diagnosis: Yes. Discussing patient identified problems/goals with staff: Yes. Medical problems stabilized or resolved: Yes. Denies suicidal/homicidal ideation: Yes. Issues/concerns per patient self-inventory: No. Other: none  New problem(s) identified: No, Describe:  none  New Short Term/Long Term Goal(s): Update 6/30: none at this time.   Patient Goals:  Update 6/30: none at this time.   Discharge Plan or Barriers: Update 6/30: APS report has been made and patient is being investigated for guardianship needs.  Remains homeless with limited supports.  Reason for Continuation of Hospitalization: Hallucinations Mania Medication stabilization  Estimated Length of Stay: Update 6/30: 1-7 days  Last 3 [place] Suicide Severity Risk Score: Flowsheet Row Admission (Current) from 08/22/2022 in [facility] ED from 08/17/2022 in [facility] Department at [facility] ED from 08/12/2022 in [facility] Emergency Department at [facility]  C-SSRS RISK CATEGORY Low Risk No Risk No Risk       Last [facility] 2/9 Scores:     No data to display          Scribe for Treatment  Team: Marshell Levan,  LCSW 09/22/2022 12:48 PM

## 2022-09-22 NOTE — Plan of Care (Signed)
  Problem: Elimination: Goal: Will not experience complications related to bowel motility Outcome: Progressing   Problem: Safety: Goal: Ability to remain free from injury will improve Outcome: Progressing   Problem: Skin Integrity: Goal: Risk for impaired skin integrity will decrease Outcome: Progressing   Problem: Elimination: Goal: Will not experience complications related to urinary retention Outcome: Not Progressing

## 2022-09-22 NOTE — Plan of Care (Signed)
Calm and cooperative. Visible on the unit. Did not participant in group. Po med compliant. No behavior issues noted. Denies SI/HI/AV/H. No c/o pain/discomfort noted. Q 15 min checks maintained for safety. Plan of care continued.   Problem: Coping: Goal: Level of anxiety will decrease Outcome: Progressing   Problem: Elimination: Goal: Will not experience complications related to bowel motility Outcome: Progressing   Problem: Pain Managment: Goal: General experience of comfort will improve Outcome: Progressing   Problem: Safety: Goal: Ability to remain free from injury will improve Outcome: Progressing

## 2022-09-22 NOTE — Progress Notes (Addendum)
Patient presents with a preoccupied mood and appropriate affect. She denies SI/HI/AVH. She denies anxiety and depression. Appetite good. No complaints of pain. Patient continues to respond to internal stimuli but without aggression. She is cooperative.  No distress noted or voiced. No PRN's given.  Q15 minute unit checks in place.

## 2022-09-22 NOTE — Group Note (Signed)
Date:  09/22/2022 Time:  5:36 PM  Group Topic/Focus:  Coping With Mental Health Crisis:   The purpose of this group is to help patients identify strategies for coping with mental health crisis.  Group discusses possible causes of crisis and ways to manage them effectively.    Participation Level:  Active  Participation Quality:  Appropriate  Affect:  Appropriate  Cognitive:  Appropriate  Insight: Appropriate  Engagement in Group:  Engaged  Modes of Intervention:  Discussion  Additional Comments:     Pailynn Vahey L Darrow Barreiro 09/22/2022, 5:36 PM

## 2022-09-22 NOTE — Progress Notes (Signed)
[facility] MD Progress Note  09/22/2022 2:52 PM Betty Henderson  MRN:  [id number] Subjective: Betty Henderson is seen on rounds.  She has been in good controls.  She is internally preoccupied at times but is goal directed at others.  She is pleasant and cooperative and denies any side effects.  Nurses report no issues. Principal Problem: Schizoaffective disorder, bipolar type (HCC) Diagnosis: Principal Problem:   Schizoaffective disorder, bipolar type (HCC)  Total Time spent with patient: 15 minutes  Past Psychiatric History: Schizoaffective disorder, bipolar type.  Past Medical History:  Past Medical History:  Diagnosis Date   Anemia    Arthritis    Chronic pain    Drug-seeking behavior    Malingering    Osteopetrosis    Psychosis (HCC)    Schizoaffective disorder, bipolar type (HCC)     Past Surgical History:  Procedure Laterality Date   MOUTH SURGERY     TUBAL LIGATION     Family History:  Family History  Family history unknown: Yes   Family Psychiatric  History: Unremarkable Social History:  Social History   Substance and Sexual Activity  Alcohol Use Not Currently   Comment: 1 cocktail 3 weeks ago     Social History   Substance and Sexual Activity  Drug Use Not Currently   Types: Cocaine   Comment: states "it's legal though"    Social History   Socioeconomic History   Marital status: Single    Spouse name: Not on file   Number of children: Not on file   Years of education: Not on file   Highest education level: Not on file  Occupational History   Not on file  Tobacco Use   Smoking status: Every Day    Packs/day: .5    Types: Cigarettes   Smokeless tobacco: Never  Vaping Use   Vaping Use: Never used  Substance and Sexual Activity   Alcohol use: Not Currently    Comment: 1 cocktail 3 weeks ago   Drug use: Not Currently    Types: Cocaine    Comment: states "it's legal though"   Sexual activity: Never  Other Topics Concern   Not on file  Social History  Narrative   Not on file   Social Determinants of Health   Financial Resource Strain: Not on file  Food Insecurity: Food Insecurity Present (08/22/2022)   Hunger Vital Sign    Worried About Running Out of Food in the Last Year: Sometimes true    Ran Out of Food in the Last Year: Sometimes true  Transportation Needs: Patient Unable To Answer (08/22/2022)   PRAPARE - Administrator, Civil Service (Medical): Patient unable to answer    Lack of Transportation (Non-Medical): Patient unable to answer  Physical Activity: Not on file  Stress: Not on file  Social Connections: Not on file   Additional Social History:                         Sleep: Good  Appetite:  Good  Current Medications: Current Facility-Administered Medications  Medication Dose Route Frequency Provider Last Rate Last Admin   acetaminophen (TYLENOL) tablet 650 mg  650 mg Oral Q6H PRN [name], DO   650 mg at 09/21/22 1842   alum & mag hydroxide-simeth (MAALOX/MYLANTA) 200-200-20 MG/5ML suspension 30 mL  30 mL Oral Q4H PRN [name], DO   30 mL at 09/20/22 1142   diphenhydrAMINE (BENADRYL) capsule 50 mg  50 mg Oral Q6H PRN [name], DO   50 mg at 09/17/22 1727   Or   diphenhydrAMINE (BENADRYL) injection 50 mg  50 mg Intramuscular Q6H PRN [name], DO       haloperidol (HALDOL) tablet 5 mg  5 mg Oral Q6H PRN [name], DO   5 mg at 09/17/22 1727   Or   haloperidol lactate (HALDOL) injection 5 mg  5 mg Intramuscular Q6H PRN [name], DO       ibuprofen (ADVIL) tablet 600 mg  600 mg Oral Q6H PRN [name], [name], MD   600 mg at 09/15/22 1810   LORazepam (ATIVAN) tablet 1 mg  1 mg Oral TID PRN [name], DO   1 mg at 09/17/22 1727   magnesium hydroxide (MILK OF MAGNESIA) suspension 30 mL  30 mL Oral Daily PRN [name], DO       paliperidone (INVEGA) 24 hr tablet 9 mg  9 mg Oral QHS [name],  [name], MD   9 mg at 09/21/22 2133   traZODone (DESYREL) tablet 50 mg  50 mg Oral QHS PRN [name], DO   50 mg at 09/21/22 2133    Lab Results: No results found for this or any previous visit (from the past 48 hour(s)).  Blood Alcohol level:  Lab Results  Component Value Date   ETH <10 08/17/2022   ETH <10 01/11/2021    Metabolic Disorder Labs: No results found for: "HGBA1C", "MPG" No results found for: "PROLACTIN" No results found for: "CHOL", "TRIG", "HDL", "CHOLHDL", "VLDL", "LDLCALC"  Physical Findings: AIMS:  , ,  ,  ,    CIWA:    COWS:     Musculoskeletal: Strength & Muscle Tone: within normal limits Gait & Station: normal Patient leans: N/A  Psychiatric Specialty Exam:  Presentation  General Appearance:  Casual; Neat  Eye Contact: Fair  Speech: Slow; Slurred  Speech Volume: Decreased  Handedness: Right   Mood and Affect  Mood: Anxious  Affect: Inappropriate   Thought Process  Thought Processes: Disorganized  Descriptions of Associations:Loose  Orientation:Partial  Thought Content:Illogical; Rumination  History of Schizophrenia/Schizoaffective disorder:No data recorded Duration of Psychotic Symptoms:No data recorded Hallucinations:No data recorded Ideas of Reference:Paranoia  Suicidal Thoughts:No data recorded Homicidal Thoughts:No data recorded  Sensorium  Memory: Immediate Fair; Remote Poor  Judgment: Poor  Insight: Poor   Executive Functions  Concentration: Poor  Attention Span: Fair  Recall: [employer] of Knowledge: Fair  Language: Fair   Psychomotor Activity  Psychomotor Activity:No data recorded  Assets  Assets: Communication Skills   Sleep  Sleep:No data recorded    Blood pressure 110/72, pulse 85, temperature (!) 97.3 F (36.3 C), resp. rate 16, height 5\' 6"  (1.676 m), weight 64 kg, SpO2 99 %. Body mass index is 22.76 kg/m.   Treatment Plan Summary: Daily contact with  patient to assess and evaluate symptoms and progress in treatment, Medication management, and Plan continue current medications.  [name], DO 09/22/2022, 2:52 PM

## 2022-09-23 DIAGNOSIS — E119 Type 2 diabetes mellitus without complications: Secondary | ICD-10-CM | POA: Diagnosis present

## 2022-09-23 DIAGNOSIS — Z9851 Tubal ligation status: Secondary | ICD-10-CM | POA: Diagnosis not present

## 2022-09-23 DIAGNOSIS — Z91018 Allergy to other foods: Secondary | ICD-10-CM | POA: Diagnosis not present

## 2022-09-23 DIAGNOSIS — F17210 Nicotine dependence, cigarettes, uncomplicated: Secondary | ICD-10-CM | POA: Diagnosis present

## 2022-09-23 DIAGNOSIS — R12 Heartburn: Secondary | ICD-10-CM | POA: Diagnosis not present

## 2022-09-23 DIAGNOSIS — G8929 Other chronic pain: Secondary | ICD-10-CM | POA: Diagnosis present

## 2022-09-23 DIAGNOSIS — G4700 Insomnia, unspecified: Secondary | ICD-10-CM | POA: Diagnosis present

## 2022-09-23 DIAGNOSIS — Z9102 Food additives allergy status: Secondary | ICD-10-CM | POA: Diagnosis not present

## 2022-09-23 DIAGNOSIS — Z91048 Other nonmedicinal substance allergy status: Secondary | ICD-10-CM | POA: Diagnosis not present

## 2022-09-23 DIAGNOSIS — Z91148 Patient's other noncompliance with medication regimen for other reason: Secondary | ICD-10-CM | POA: Diagnosis not present

## 2022-09-23 DIAGNOSIS — R32 Unspecified urinary incontinence: Secondary | ICD-10-CM | POA: Diagnosis not present

## 2022-09-23 DIAGNOSIS — E8881 Metabolic syndrome: Secondary | ICD-10-CM | POA: Diagnosis not present

## 2022-09-23 DIAGNOSIS — M549 Dorsalgia, unspecified: Secondary | ICD-10-CM | POA: Diagnosis not present

## 2022-09-23 DIAGNOSIS — F200 Paranoid schizophrenia: Secondary | ICD-10-CM | POA: Diagnosis present

## 2022-09-23 DIAGNOSIS — F250 Schizoaffective disorder, bipolar type: Secondary | ICD-10-CM | POA: Diagnosis present

## 2022-09-23 DIAGNOSIS — Z5941 Food insecurity: Secondary | ICD-10-CM | POA: Diagnosis not present

## 2022-09-23 DIAGNOSIS — B351 Tinea unguium: Secondary | ICD-10-CM | POA: Diagnosis present

## 2022-09-23 DIAGNOSIS — E782 Mixed hyperlipidemia: Secondary | ICD-10-CM | POA: Diagnosis present

## 2022-09-23 DIAGNOSIS — Z888 Allergy status to other drugs, medicaments and biological substances status: Secondary | ICD-10-CM | POA: Diagnosis not present

## 2022-09-23 DIAGNOSIS — Z5982 Transportation insecurity: Secondary | ICD-10-CM | POA: Diagnosis not present

## 2022-09-23 DIAGNOSIS — R4781 Slurred speech: Secondary | ICD-10-CM | POA: Diagnosis not present

## 2022-09-23 DIAGNOSIS — Q782 Osteopetrosis: Secondary | ICD-10-CM | POA: Diagnosis not present

## 2022-09-23 DIAGNOSIS — Z5900 Homelessness unspecified: Secondary | ICD-10-CM | POA: Diagnosis not present

## 2022-09-23 DIAGNOSIS — R413 Other amnesia: Secondary | ICD-10-CM | POA: Diagnosis present

## 2022-09-23 DIAGNOSIS — M1990 Unspecified osteoarthritis, unspecified site: Secondary | ICD-10-CM | POA: Diagnosis present

## 2022-09-23 NOTE — Group Note (Signed)
[facility] LCSW Group Therapy Note    Group Date: 09/23/2022 Start Time: 1300 End Time: 1350  Type of Therapy and Topic:  Group Therapy:  Overcoming Obstacles  Participation Level:  [facility] PARTICIPATION LEVEL: None  Mood:  Description of Group:   In this group patients will be encouraged to explore what they see as obstacles to their own wellness and recovery. They will be guided to discuss their thoughts, feelings, and behaviors related to these obstacles. The group will process together ways to cope with barriers, with attention given to specific choices patients can make. Each patient will be challenged to identify changes they are motivated to make in order to overcome their obstacles. This group will be process-oriented, with patients participating in exploration of their own experiences as well as giving and receiving support and challenge from other group members.  Therapeutic Goals: 1. Patient will identify personal and current obstacles as they relate to admission. 2. Patient will identify barriers that currently interfere with their wellness or overcoming obstacles.  3. Patient will identify feelings, thought process and behaviors related to these barriers. 4. Patient will identify two changes they are willing to make to overcome these obstacles:    Summary of Patient Progress   Patient was present in group.  Patient did not participate, however was observed to be responding to internal stimuli throughout.    Therapeutic Modalities:   Cognitive Behavioral Therapy Solution Focused Therapy Motivational Interviewing Relapse Prevention Therapy   Harden Mo, LCSW

## 2022-09-23 NOTE — Progress Notes (Signed)
[facility] MD Progress Note  09/23/2022 1:55 PM Betty Henderson  MRN:  [id number] Subjective: Betty Henderson is seen on rounds.  She has no complaints.  She has been compliant with medications and denies any side effects.  She is pleasantly delusional at times and appropriate at others.  She been in good controls. Principal Problem: Schizoaffective disorder, bipolar type (HCC) Diagnosis: Principal Problem:   Schizoaffective disorder, bipolar type (HCC)  Total Time spent with patient: 15 minutes  Past Psychiatric History: Schizoaffective disorder, bipolar type.  Past Medical History:  Past Medical History:  Diagnosis Date   Anemia    Arthritis    Chronic pain    Drug-seeking behavior    Malingering    Osteopetrosis    Psychosis (HCC)    Schizoaffective disorder, bipolar type (HCC)     Past Surgical History:  Procedure Laterality Date   MOUTH SURGERY     TUBAL LIGATION     Family History:  Family History  Family history unknown: Yes   Family Psychiatric  History: Unremarkable Social History:  Social History   Substance and Sexual Activity  Alcohol Use Not Currently   Comment: 1 cocktail 3 weeks ago     Social History   Substance and Sexual Activity  Drug Use Not Currently   Types: Cocaine   Comment: states "it's legal though"    Social History   Socioeconomic History   Marital status: Single    Spouse name: Not on file   Number of children: Not on file   Years of education: Not on file   Highest education level: Not on file  Occupational History   Not on file  Tobacco Use   Smoking status: Every Day    Packs/day: .5    Types: Cigarettes   Smokeless tobacco: Never  Vaping Use   Vaping Use: Never used  Substance and Sexual Activity   Alcohol use: Not Currently    Comment: 1 cocktail 3 weeks ago   Drug use: Not Currently    Types: Cocaine    Comment: states "it's legal though"   Sexual activity: Never  Other Topics Concern   Not on file  Social History Narrative    Not on file   Social Determinants of Health   Financial Resource Strain: Not on file  Food Insecurity: Food Insecurity Present (08/22/2022)   Hunger Vital Sign    Worried About Running Out of Food in the Last Year: Sometimes true    Ran Out of Food in the Last Year: Sometimes true  Transportation Needs: Patient Unable To Answer (08/22/2022)   PRAPARE - Administrator, Civil Service (Medical): Patient unable to answer    Lack of Transportation (Non-Medical): Patient unable to answer  Physical Activity: Not on file  Stress: Not on file  Social Connections: Not on file   Additional Social History:                         Sleep: Good  Appetite:  Good  Current Medications: Current Facility-Administered Medications  Medication Dose Route Frequency Provider Last Rate Last Admin   acetaminophen (TYLENOL) tablet 650 mg  650 mg Oral Q6H PRN [name], DO   650 mg at 09/22/22 2130   alum & mag hydroxide-simeth (MAALOX/MYLANTA) 200-200-20 MG/5ML suspension 30 mL  30 mL Oral Q4H PRN [name], DO   30 mL at 09/20/22 1142   diphenhydrAMINE (BENADRYL) capsule 50 mg  50  mg Oral Q6H PRN [name], DO   50 mg at 09/17/22 1727   Or   diphenhydrAMINE (BENADRYL) injection 50 mg  50 mg Intramuscular Q6H PRN [name], DO       haloperidol (HALDOL) tablet 5 mg  5 mg Oral Q6H PRN [name], DO   5 mg at 09/17/22 1727   Or   haloperidol lactate (HALDOL) injection 5 mg  5 mg Intramuscular Q6H PRN [name], DO       ibuprofen (ADVIL) tablet 600 mg  600 mg Oral Q6H PRN [name], [name], MD   600 mg at 09/15/22 1810   LORazepam (ATIVAN) tablet 1 mg  1 mg Oral TID PRN [name], DO   1 mg at 09/17/22 1727   magnesium hydroxide (MILK OF MAGNESIA) suspension 30 mL  30 mL Oral Daily PRN [name], DO       paliperidone (INVEGA) 24 hr tablet 9 mg  9 mg Oral QHS [name], [name], MD    9 mg at 09/22/22 2130   traZODone (DESYREL) tablet 50 mg  50 mg Oral QHS PRN [name], DO   50 mg at 09/22/22 2130    Lab Results: No results found for this or any previous visit (from the past 48 hour(s)).  Blood Alcohol level:  Lab Results  Component Value Date   ETH <10 08/17/2022   ETH <10 01/11/2021    Metabolic Disorder Labs: No results found for: "HGBA1C", "MPG" No results found for: "PROLACTIN" No results found for: "CHOL", "TRIG", "HDL", "CHOLHDL", "VLDL", "LDLCALC"  Physical Findings: AIMS:  , ,  ,  ,    CIWA:    COWS:     Musculoskeletal: Strength & Muscle Tone: within normal limits Gait & Station: normal Patient leans: N/A  Psychiatric Specialty Exam:  Presentation  General Appearance:  Casual; Neat  Eye Contact: Fair  Speech: Slow; Slurred  Speech Volume: Decreased  Handedness: Right   Mood and Affect  Mood: Anxious  Affect: Inappropriate   Thought Process  Thought Processes: Disorganized  Descriptions of Associations:Loose  Orientation:Partial  Thought Content:Illogical; Rumination  History of Schizophrenia/Schizoaffective disorder:No data recorded Duration of Psychotic Symptoms:No data recorded Hallucinations:No data recorded Ideas of Reference:Paranoia  Suicidal Thoughts:No data recorded Homicidal Thoughts:No data recorded  Sensorium  Memory: Immediate Fair; Remote Poor  Judgment: Poor  Insight: Poor   Executive Functions  Concentration: Poor  Attention Span: Fair  Recall: [employer] of Knowledge: Fair  Language: Fair   Psychomotor Activity  Psychomotor Activity:No data recorded  Assets  Assets: Communication Skills   Sleep  Sleep:No data recorded   Blood pressure 109/71, pulse 69, temperature 97.9 F (36.6 C), resp. rate 15, height 5\' 6"  (1.676 m), weight 64 kg, SpO2 100 %. Body mass index is 22.76 kg/m.   Treatment Plan Summary: Daily contact with patient to assess  and evaluate symptoms and progress in treatment, Medication management, and Plan continue current medications.  Deshana Rominger Tresea Mall, DO 09/23/2022, 1:55 PM

## 2022-09-23 NOTE — Group Note (Signed)
Date:  09/23/2022 Time:  3:16 AM  Group Topic/Focus:  Emotional Education:   The focus of this group is to discuss what feelings/emotions are, and how they are experienced.  The focus of this group is to learn each patients love language which leads to building healthier relationships with their support systems.   Participation Level:  Did Not Attend  Participation Quality:   None  Affect:   None  Cognitive:   None  Insight: None  Engagement in Group:  None  Modes of Intervention:  Activity and Education  Additional Comments:  Ms. Raygan was encouraged to attend group. The patient refused.   Berna Spare H Lucas Winograd 09/23/2022, 3:16 AM

## 2022-09-23 NOTE — Progress Notes (Signed)
   09/23/22 0716  Psych Admission Type (Psych Patients Only)  Admission Status Voluntary  Psychosocial Assessment  Patient Complaints None  Eye Contact Fair  Facial Expression Flat  Affect Appropriate to circumstance  Speech Slurred  Interaction Assertive  Motor Activity Slow  Appearance/Hygiene Disheveled;Bizarre;Layered clothes  Behavior Characteristics Agitated  Mood Preoccupied;Irritable;Labile  Thought Process  Coherency Disorganized  Content Preoccupation  Delusions None reported or observed  Perception Hallucinations  Hallucination Auditory;Visual  Judgment Impaired  Confusion Mild  Danger to Self  Current suicidal ideation? Denies  Danger to Others  Danger to Others None reported or observed  Danger to Others Abnormal  Harmful Behavior to others No threats or harm toward other people

## 2022-09-23 NOTE — Progress Notes (Signed)
   09/23/22 2000  Psych Admission Type (Psych Patients Only)  Admission Status Voluntary  Psychosocial Assessment  Patient Complaints None  Eye Contact Fair  Facial Expression Flat  Affect Appropriate to circumstance  Speech Slurred  Interaction Assertive  Motor Activity Slow  Appearance/Hygiene Disheveled;Layered clothes  Behavior Characteristics Cooperative  Mood Pleasant;Preoccupied  Thought Process  Coherency Disorganized  Content Preoccupation  Delusions None reported or observed  Perception Hallucinations  Hallucination Auditory  Judgment Impaired  Confusion Mild  Danger to Self  Current suicidal ideation? Denies  Danger to Others  Danger to Others None reported or observed  Danger to Others Abnormal  Harmful Behavior to others No threats or harm toward other people

## 2022-09-23 NOTE — Plan of Care (Signed)
Pleasant and cooperative. RIS. Limited interaction with peers and staff. C/o arm pain. Tylenol 650 mg po given as ordered PRN for discomfort. Q 15 min checks maintained for safety. Denies SI/HI/AV/H. Plan of care continued.   Problem: Coping: Goal: Level of anxiety will decrease Outcome: Progressing   Problem: Elimination: Goal: Will not experience complications related to bowel motility Outcome: Progressing Goal: Will not experience complications related to urinary retention Outcome: Progressing   Problem: Pain Managment: Goal: General experience of comfort will improve Outcome: Progressing   Problem: Safety: Goal: Ability to remain free from injury will improve Outcome: Progressing

## 2022-09-23 NOTE — Group Note (Signed)
Recreation Therapy Group Note   Group Topic:General Recreation  Group Date: 09/23/2022 Start Time: 1410 End Time: 1450 Facilitators: Rosina Lowenstein, LRT, CTRS Location: Courtyard  Group Description: Outdoor Recreation. Patients had the option to play ring toss, play with a deck of cards or sit and listen to music while outside in the courtyard getting fresh air and sunlight. LRT and pts discussed things that they enjoy doing in their free time outside of the hospital.  Goal Area(s) Addressed: Patient will identify leisure interests.  Patient will practice healthy decision making. Patient will engage in recreation activity.   Affect/Mood: Appropriate   Participation Level: Moderate   Participation Quality: Independent   Behavior: Alert   Speech/Thought Process: Flight of ideas   Insight: Fair   Judgement: Limited   Modes of Intervention: Activity   Patient Response to Interventions:  Receptive   Education Outcome:  In group clarification offered    Clinical Observations/Individualized Feedback: Betty Henderson was mostly active in their participation of session activities and group discussion. Pt was talking to herself loudly at the beginning of group and seemed somewhat agitated but was able to calm herself down. Pt sat outside and listened to music. Pt went inside a little early. Pt interacted well with LRT and peers duration of session.   Plan: Continue to engage patient in RT group sessions 2-3x/week.   Rosina Lowenstein, LRT, CTRS 09/23/2022 3:27 PM

## 2022-09-23 NOTE — Plan of Care (Signed)
  Problem: Education: Goal: Knowledge of General Education information will improve Description: Including pain rating scale, medication(s)/side effects and non-pharmacologic comfort measures Outcome: Progressing   Problem: Health Behavior/Discharge Planning: Goal: Ability to manage health-related needs will improve Outcome: Progressing   Problem: Clinical Measurements: Goal: Ability to maintain clinical measurements within normal limits will improve Outcome: Progressing Goal: Diagnostic test results will improve Outcome: Progressing   Problem: Nutrition: Goal: Adequate nutrition will be maintained Outcome: Progressing   Problem: Coping: Goal: Level of anxiety will decrease Outcome: Progressing   Problem: Elimination: Goal: Will not experience complications related to bowel motility Outcome: Progressing Goal: Will not experience complications related to urinary retention Outcome: Progressing   

## 2022-09-24 DIAGNOSIS — F250 Schizoaffective disorder, bipolar type: Secondary | ICD-10-CM | POA: Diagnosis not present

## 2022-09-24 NOTE — Progress Notes (Signed)
   09/24/22 2000  Psych Admission Type (Psych Patients Only)  Admission Status Voluntary  Psychosocial Assessment  Patient Complaints None  Eye Contact Fair  Facial Expression Flat  Affect Flat  Speech Slurred  Interaction Assertive  Motor Activity Slow  Appearance/Hygiene Layered clothes;Disheveled  Behavior Characteristics Appropriate to situation  Mood Preoccupied;Pleasant  Thought Process  Coherency Flight of ideas  Content Preoccupation  Delusions None reported or observed  Perception Hallucinations  Hallucination Auditory  Judgment Impaired  Confusion Mild  Danger to Self  Current suicidal ideation? Denies  Danger to Others  Danger to Others None reported or observed  Danger to Others Abnormal  Harmful Behavior to others No threats or harm toward other people

## 2022-09-24 NOTE — Group Note (Signed)
Recreation Therapy Group Note   Group Topic:Coping Skills  Group Date: 09/24/2022 Start Time: 1400 End Time: 1440 Facilitators: Rosina Lowenstein, LRT, CTRS Location: Courtyard  Group Description: Coping Skills Hot Potato. LRT and patients discussed the definition of what a coping skill is. LRT and pts discussed the importance of using coping skills and different times when they can be used. LRT and pts sat in a circle and tossed around a small ball, in random order, while listening to music. When the music stopped, whoever had the ball in their hands were encouraged to name a coping skill. LRT added in a second ball to make things more challenging.   Goal Area(s) Addressed: Patients will be able to define "coping skills". Patient will identify new coping skills.  Patient will increase communication. Patient will gain team building skills.  Affect/Mood: Appropriate   Participation Level: Active and Engaged   Participation Quality: Independent   Behavior: Calm and Cooperative   Speech/Thought Process: Coherent   Insight: Good   Judgement: Good   Modes of Intervention: Activity and Guided Discussion   Patient Response to Interventions:  Attentive, Engaged, Interested , and Receptive   Education Outcome:  Acknowledges education   Clinical Observations/Individualized Feedback: Betty Henderson was active in their participation of session activities and group discussion. Pt identified "sewing or quilting. I like to do patchwork" as coping skills. Pt played game appropriately while interacting well with LRT and peers duration of session.   Plan: Continue to engage patient in RT group sessions 2-3x/week.   Rosina Lowenstein, LRT, CTRS 09/24/2022 2:55 PM

## 2022-09-24 NOTE — Progress Notes (Signed)
   09/24/22 0800  Psych Admission Type (Psych Patients Only)  Admission Status Voluntary  Psychosocial Assessment  Patient Complaints None  Eye Contact Fair  Facial Expression Flat  Affect Flat  Speech Slurred  Interaction Assertive  Motor Activity Slow  Appearance/Hygiene Layered clothes  Behavior Characteristics Appropriate to situation  Mood Preoccupied  Thought Process  Coherency Flight of ideas  Content Preoccupation  Delusions None reported or observed  Perception Hallucinations  Hallucination Auditory;Visual  Judgment Impaired  Confusion Mild  Danger to Self  Current suicidal ideation? Denies  Danger to Others  Danger to Others None reported or observed  Danger to Others Abnormal  Harmful Behavior to others No threats or harm toward other people

## 2022-09-24 NOTE — Progress Notes (Signed)
   09/24/22 0600  15 Minute Checks  Location Bedroom  Visual Appearance Calm  Behavior Sleeping  Sleep (Behavioral Health Patients Only)  Calculate sleep? (Click Yes once per 24 hr at 0600 safety check) Yes  Documented sleep last 24 hours 6.75

## 2022-09-24 NOTE — Group Note (Signed)
Date:  09/24/2022 Time:  8:30 PM  Group Topic/Focus:  Self Care:   The focus of this group is to help patients understand the importance of self-care in order to improve or restore emotional, physical, spiritual, interpersonal, and financial health.    Participation Level:  Active  Participation Quality:  Appropriate  Affect:  Appropriate  Cognitive:  Alert  Insight: Good  Engagement in Group:  Engaged  Modes of Intervention:  Education  Additional Comments:    [name] 09/24/2022, 8:30 PM

## 2022-09-24 NOTE — Progress Notes (Signed)
[facility] MD Progress Note  09/24/2022 12:24 PM Betty Henderson  MRN:  [id number] Subjective: Betty Henderson is seen on rounds.  She is being compliant with medications.  She denies any side effects.  No evidence of EPS or TD.  Social work continues to look for placement.  There is been in good controls.  She does have her episodes of being internally preoccupied and delusional. Principal Problem: Schizoaffective disorder, bipolar type (HCC) Diagnosis: Principal Problem:   Schizoaffective disorder, bipolar type (HCC)  Total Time spent with patient: 15 minutes  Past Psychiatric History: Schizoaffective disorder, bipolar type  Past Medical History:  Past Medical History:  Diagnosis Date   Anemia    Arthritis    Chronic pain    Drug-seeking behavior    Malingering    Osteopetrosis    Psychosis (HCC)    Schizoaffective disorder, bipolar type (HCC)     Past Surgical History:  Procedure Laterality Date   MOUTH SURGERY     TUBAL LIGATION     Family History:  Family History  Family history unknown: Yes   Family Psychiatric  History: Unremarkable Social History:  Social History   Substance and Sexual Activity  Alcohol Use Not Currently   Comment: 1 cocktail 3 weeks ago     Social History   Substance and Sexual Activity  Drug Use Not Currently   Types: Cocaine   Comment: states "it's legal though"    Social History   Socioeconomic History   Marital status: Single    Spouse name: Not on file   Number of children: Not on file   Years of education: Not on file   Highest education level: Not on file  Occupational History   Not on file  Tobacco Use   Smoking status: Every Day    Packs/day: .5    Types: Cigarettes   Smokeless tobacco: Never  Vaping Use   Vaping Use: Never used  Substance and Sexual Activity   Alcohol use: Not Currently    Comment: 1 cocktail 3 weeks ago   Drug use: Not Currently    Types: Cocaine    Comment: states "it's legal though"   Sexual activity:  Never  Other Topics Concern   Not on file  Social History Narrative   Not on file   Social Determinants of Health   Financial Resource Strain: Not on file  Food Insecurity: Food Insecurity Present (08/22/2022)   Hunger Vital Sign    Worried About Running Out of Food in the Last Year: Sometimes true    Ran Out of Food in the Last Year: Sometimes true  Transportation Needs: Patient Unable To Answer (08/22/2022)   PRAPARE - Administrator, Civil Service (Medical): Patient unable to answer    Lack of Transportation (Non-Medical): Patient unable to answer  Physical Activity: Not on file  Stress: Not on file  Social Connections: Not on file   Additional Social History:                         Sleep: Good  Appetite:  Good  Current Medications: Current Facility-Administered Medications  Medication Dose Route Frequency Provider Last Rate Last Admin   acetaminophen (TYLENOL) tablet 650 mg  650 mg Oral Q6H PRN [name], DO   650 mg at 09/22/22 2130   alum & mag hydroxide-simeth (MAALOX/MYLANTA) 200-200-20 MG/5ML suspension 30 mL  30 mL Oral Q4H PRN [name], DO   30  mL at 09/20/22 1142   diphenhydrAMINE (BENADRYL) capsule 50 mg  50 mg Oral Q6H PRN [name], DO   50 mg at 09/17/22 1727   Or   diphenhydrAMINE (BENADRYL) injection 50 mg  50 mg Intramuscular Q6H PRN [name], DO       haloperidol (HALDOL) tablet 5 mg  5 mg Oral Q6H PRN [name], DO   5 mg at 09/17/22 1727   Or   haloperidol lactate (HALDOL) injection 5 mg  5 mg Intramuscular Q6H PRN [name], DO       ibuprofen (ADVIL) tablet 600 mg  600 mg Oral Q6H PRN [name], [name], MD   600 mg at 09/15/22 1810   LORazepam (ATIVAN) tablet 1 mg  1 mg Oral TID PRN [name], DO   1 mg at 09/17/22 1727   magnesium hydroxide (MILK OF MAGNESIA) suspension 30 mL  30 mL Oral Daily PRN [name], DO        paliperidone (INVEGA) 24 hr tablet 9 mg  9 mg Oral QHS [name], [name], MD   9 mg at 09/23/22 2130   traZODone (DESYREL) tablet 50 mg  50 mg Oral QHS PRN [name], DO   50 mg at 09/23/22 2131    Lab Results: No results found for this or any previous visit (from the past 48 hour(s)).  Blood Alcohol level:  Lab Results  Component Value Date   ETH <10 08/17/2022   ETH <10 01/11/2021    Metabolic Disorder Labs: No results found for: "HGBA1C", "MPG" No results found for: "PROLACTIN" No results found for: "CHOL", "TRIG", "HDL", "CHOLHDL", "VLDL", "LDLCALC"  Physical Findings: AIMS:  , ,  ,  ,    CIWA:    COWS:     Musculoskeletal: Strength & Muscle Tone: within normal limits Gait & Station: normal Patient leans: N/A  Psychiatric Specialty Exam:  Presentation  General Appearance:  Casual; Neat  Eye Contact: Fair  Speech: Slow; Slurred  Speech Volume: Decreased  Handedness: Right   Mood and Affect  Mood: Anxious  Affect: Inappropriate   Thought Process  Thought Processes: Disorganized  Descriptions of Associations:Loose  Orientation:Partial  Thought Content:Illogical; Rumination  History of Schizophrenia/Schizoaffective disorder:No data recorded Duration of Psychotic Symptoms:No data recorded Hallucinations:No data recorded Ideas of Reference:Paranoia  Suicidal Thoughts:No data recorded Homicidal Thoughts:No data recorded  Sensorium  Memory: Immediate Fair; Remote Poor  Judgment: Poor  Insight: Poor   Executive Functions  Concentration: Poor  Attention Span: Fair  Recall: [employer] of Knowledge: Fair  Language: Fair   Psychomotor Activity  Psychomotor Activity:No data recorded  Assets  Assets: Communication Skills   Sleep  Sleep:No data recorded   Physical Exam: Physical Exam Vitals and nursing note reviewed.  Constitutional:      Appearance: Normal appearance. She is normal weight.   Neurological:     General: No focal deficit present.     Mental Status: She is alert and oriented to person, place, and time.  Psychiatric:        Attention and Perception: Attention normal.        Mood and Affect: Mood normal. Affect is inappropriate.        Speech: Speech is tangential.        Behavior: Behavior normal. Behavior is cooperative.        Thought Content: Thought content is paranoid and delusional.        Cognition and  Memory: Cognition and memory normal.        Judgment: Judgment is inappropriate.    Review of Systems  Constitutional: Negative.   HENT: Negative.    Eyes: Negative.   Respiratory: Negative.    Cardiovascular: Negative.   Gastrointestinal: Negative.   Genitourinary: Negative.   Musculoskeletal: Negative.   Skin: Negative.   Neurological: Negative.   Endo/Heme/Allergies: Negative.   Psychiatric/Behavioral: Negative.     Blood pressure 115/73, pulse 75, temperature 99.3 F (37.4 C), temperature source Oral, resp. rate 18, height 5\' 6"  (1.676 m), weight 64 kg, SpO2 100 %. Body mass index is 22.76 kg/m.   Treatment Plan Summary: Daily contact with patient to assess and evaluate symptoms and progress in treatment, Medication management, and Plan continue current medications.  [name], DO 09/24/2022, 12:24 PM

## 2022-09-24 NOTE — Plan of Care (Signed)
  Problem: Pain Managment: Goal: General experience of comfort will improve Outcome: Progressing   Problem: Safety: Goal: Ability to remain free from injury will improve Outcome: Progressing   Problem: Skin Integrity: Goal: Risk for impaired skin integrity will decrease Outcome: Progressing   

## 2022-09-25 DIAGNOSIS — F250 Schizoaffective disorder, bipolar type: Secondary | ICD-10-CM | POA: Diagnosis not present

## 2022-09-25 NOTE — Progress Notes (Signed)
   09/25/22 0549  15 Minute Checks  Location Bedroom  Visual Appearance Calm  Behavior Composed  Sleep (Behavioral Health Patients Only)  Calculate sleep? (Click Yes once per 24 hr at 0600 safety check) Yes  Documented sleep last 24 hours 9.75

## 2022-09-25 NOTE — Progress Notes (Signed)
[facility] MD Progress Note  09/25/2022 12:31 PM Betty Henderson  MRN:  [id number] Subjective: Betty Henderson is seen on rounds.  She has been compliant with her medication.  Social work still does not know what her placement will be but when I find out we will give her long-acting injection of the [place].  She has been compliant with her oral dose states that she does not need his shots.  We will see depending on where she goes.  No side effects.  No evidence of EPS or TD.  She has been in good controls on the unit.  Responds to internal stimuli at times and yells out at other times but generally she is doing pretty well. Principal Problem: Schizoaffective disorder, bipolar type (HCC) Diagnosis: Principal Problem:   Schizoaffective disorder, bipolar type (HCC)  Total Time spent with patient: 15 minutes  Past Psychiatric History: Schizophrenia versus schizoaffective disorder  Past Medical History:  Past Medical History:  Diagnosis Date   Anemia    Arthritis    Chronic pain    Drug-seeking behavior    Malingering    Osteopetrosis    Psychosis (HCC)    Schizoaffective disorder, bipolar type (HCC)     Past Surgical History:  Procedure Laterality Date   MOUTH SURGERY     TUBAL LIGATION     Family History:  Family History  Family history unknown: Yes   Family Psychiatric  History: Unremarkable Social History:  Social History   Substance and Sexual Activity  Alcohol Use Not Currently   Comment: 1 cocktail 3 weeks ago     Social History   Substance and Sexual Activity  Drug Use Not Currently   Types: Cocaine   Comment: states "it's legal though"    Social History   Socioeconomic History   Marital status: Single    Spouse name: Not on file   Number of children: Not on file   Years of education: Not on file   Highest education level: Not on file  Occupational History   Not on file  Tobacco Use   Smoking status: Every Day    Packs/day: .5    Types: Cigarettes   Smokeless  tobacco: Never  Vaping Use   Vaping Use: Never used  Substance and Sexual Activity   Alcohol use: Not Currently    Comment: 1 cocktail 3 weeks ago   Drug use: Not Currently    Types: Cocaine    Comment: states "it's legal though"   Sexual activity: Never  Other Topics Concern   Not on file  Social History Narrative   Not on file   Social Determinants of Health   Financial Resource Strain: Not on file  Food Insecurity: Food Insecurity Present (08/22/2022)   Hunger Vital Sign    Worried About Running Out of Food in the Last Year: Sometimes true    Ran Out of Food in the Last Year: Sometimes true  Transportation Needs: Patient Unable To Answer (08/22/2022)   PRAPARE - Administrator, Civil Service (Medical): Patient unable to answer    Lack of Transportation (Non-Medical): Patient unable to answer  Physical Activity: Not on file  Stress: Not on file  Social Connections: Not on file   Additional Social History:                         Sleep: Good  Appetite:  Good  Current Medications: Current Facility-Administered Medications  Medication Dose Route Frequency  Provider Last Rate Last Admin   acetaminophen (TYLENOL) tablet 650 mg  650 mg Oral Q6H PRN [name], DO   650 mg at 09/22/22 2130   alum & mag hydroxide-simeth (MAALOX/MYLANTA) 200-200-20 MG/5ML suspension 30 mL  30 mL Oral Q4H PRN [name], DO   30 mL at 09/20/22 1142   diphenhydrAMINE (BENADRYL) capsule 50 mg  50 mg Oral Q6H PRN [name], DO   50 mg at 09/17/22 1727   Or   diphenhydrAMINE (BENADRYL) injection 50 mg  50 mg Intramuscular Q6H PRN [name], DO       haloperidol (HALDOL) tablet 5 mg  5 mg Oral Q6H PRN [name], DO   5 mg at 09/17/22 1727   Or   haloperidol lactate (HALDOL) injection 5 mg  5 mg Intramuscular Q6H PRN [name], DO       ibuprofen (ADVIL) tablet 600 mg  600 mg Oral Q6H PRN [name],  [name], MD   600 mg at 09/15/22 1810   LORazepam (ATIVAN) tablet 1 mg  1 mg Oral TID PRN [name], DO   1 mg at 09/17/22 1727   magnesium hydroxide (MILK OF MAGNESIA) suspension 30 mL  30 mL Oral Daily PRN [name], DO       paliperidone (INVEGA) 24 hr tablet 9 mg  9 mg Oral QHS [name], [name], MD   9 mg at 09/24/22 2128   traZODone (DESYREL) tablet 50 mg  50 mg Oral QHS PRN [name], DO   50 mg at 09/24/22 2128    Lab Results: No results found for this or any previous visit (from the past 48 hour(s)).  Blood Alcohol level:  Lab Results  Component Value Date   ETH <10 08/17/2022   ETH <10 01/11/2021    Metabolic Disorder Labs: No results found for: "HGBA1C", "MPG" No results found for: "PROLACTIN" No results found for: "CHOL", "TRIG", "HDL", "CHOLHDL", "VLDL", "LDLCALC"  Physical Findings: AIMS:  , ,  ,  ,    CIWA:    COWS:     Musculoskeletal: Strength & Muscle Tone: within normal limits Gait & Station: normal Patient leans: N/A  Psychiatric Specialty Exam:  Presentation  General Appearance:  Casual; Neat  Eye Contact: Fair  Speech: Slow; Slurred  Speech Volume: Decreased  Handedness: Right   Mood and Affect  Mood: Anxious  Affect: Inappropriate   Thought Process  Thought Processes: Disorganized  Descriptions of Associations:Loose  Orientation:Partial  Thought Content:Illogical; Rumination  History of Schizophrenia/Schizoaffective disorder:No data recorded Duration of Psychotic Symptoms:No data recorded Hallucinations:No data recorded Ideas of Reference:Paranoia  Suicidal Thoughts:No data recorded Homicidal Thoughts:No data recorded  Sensorium  Memory: Immediate Fair; Remote Poor  Judgment: Poor  Insight: Poor   Executive Functions  Concentration: Poor  Attention Span: Fair  Recall: [employer] of Knowledge: Fair  Language: Fair   Psychomotor Activity  Psychomotor  Activity:No data recorded  Assets  Assets: Communication Skills   Sleep  Sleep:No data recorded   Physical Exam: Physical Exam Vitals and nursing note reviewed.  Constitutional:      Appearance: Normal appearance. She is normal weight.  Neurological:     General: No focal deficit present.     Mental Status: She is alert and oriented to person, place, and time.  Psychiatric:        Attention and Perception: Attention and perception normal.  Mood and Affect: Mood and affect normal.        Speech: Speech is tangential.        Behavior: Behavior normal. Behavior is cooperative.        Thought Content: Thought content normal.        Cognition and Memory: Cognition is impaired.        Judgment: Judgment is inappropriate.    Review of Systems  Constitutional: Negative.   HENT: Negative.    Eyes: Negative.   Respiratory: Negative.    Cardiovascular: Negative.   Gastrointestinal: Negative.   Genitourinary: Negative.   Musculoskeletal: Negative.   Skin: Negative.   Neurological: Negative.   Endo/Heme/Allergies: Negative.   Psychiatric/Behavioral: Negative.     Blood pressure 120/67, pulse 77, temperature 97.7 F (36.5 C), resp. rate 15, height 5\' 6"  (1.676 m), weight 64 kg, SpO2 99 %. Body mass index is 22.76 kg/m.   Treatment Plan Summary: Daily contact with patient to assess and evaluate symptoms and progress in treatment, Medication management, and Plan continue current medications.  [name], DO 09/25/2022, 12:31 PM

## 2022-09-25 NOTE — Group Note (Signed)
Date:  09/25/2022 Time:  10:30 AM  Group Topic/Focus:  Goals Group:   The focus of this group is to help patients establish daily goals to achieve during treatment and discuss how the patient can incorporate goal setting into their daily lives to aide in recovery.    Participation Level:  Did Not Attend    Rodena Goldmann 09/25/2022, 10:30 AM

## 2022-09-25 NOTE — Group Note (Signed)
Recreation Therapy Group Note   Group Topic:Health and Wellness  Group Date: 09/25/2022 Start Time: 1400 End Time: 1440 Facilitators: Rosina Lowenstein, LRT, CTRS Location:  Dayroom  Group Description: Seated Exercise/ Tai Chi. LRT discussed the mental and physical benefits of exercise. LRT and group discussed how physical activity can be used as a coping skill. Pt's and LRT followed along to an exercise video on the TV screen that provided a visual representation and audio description of every exercise performed. Pt's encouraged to listen to their bodies and stop at any time if they experience feelings of discomfort or pain. LRT passed out water after session was over and encouraged pts do drink and stay hydrated.  Goal Area(s) Addressed: Patient will learn benefits of physical activity. Patient will identify exercise as a coping skill.  Patient will follow multistep directions. Patient will try a new leisure interest.   Affect/Mood: N/A   Participation Level: Did not attend    Clinical Observations/Individualized Feedback: Betty Henderson did not attend group.  Plan: Continue to engage patient in RT group sessions 2-3x/week.   Rosina Lowenstein, LRT, CTRS 09/25/2022 3:03 PM

## 2022-09-25 NOTE — Group Note (Signed)
Date:  09/25/2022 Time:  9:03 PM  Group Topic/Focus:  Coping With Mental Health Crisis:   The purpose of this group is to help patients identify strategies for coping with mental health crisis.  Group discusses possible causes of crisis and ways to manage them effectively.    Participation Level:  Did Not Attend  Participation Quality:   Did Not Attend  Affect:   Did Not Attend  Cognitive:   Did Not Attend  Insight: None  Engagement in Group:  None  Modes of Intervention:   Did Not Attend  Additional Comments:    [name] 09/25/2022, 9:03 PM

## 2022-09-25 NOTE — Progress Notes (Signed)
   09/25/22 2300  Psych Admission Type (Psych Patients Only)  Admission Status Voluntary  Psychosocial Assessment  Patient Complaints None  Eye Contact Fair  Facial Expression Grimacing  Affect Flat  Speech Slurred;Slow  Interaction Assertive  Motor Activity Slow  Appearance/Hygiene Excess accessories;Layered clothes  Behavior Characteristics Appropriate to situation  Mood Preoccupied  Thought Process  Coherency Flight of ideas  Content Preoccupation  Delusions None reported or observed  Perception Hallucinations  Hallucination Auditory;Visual  Judgment Impaired  Confusion Mild  Danger to Self  Current suicidal ideation? Denies  Agreement Not to Harm Self Yes  Description of Agreement verbal  Danger to Others  Danger to Others None reported or observed  Danger to Others Abnormal  Harmful Behavior to others No threats or harm toward other people  Destructive Behavior No threats or harm toward property

## 2022-09-25 NOTE — Progress Notes (Signed)
Patient is alert and oriented. Affect flat. Mood preoccupied. She denies SI/HI/AVH. Patient denies anxiety and depression. She is responding to internal stimuli regularly but no unsafe behaviors noted. She isolated in her room mostly but did appear for meals and snacks.  No scheduled meds on this shift. No PRN's requested.  Q15 minute unit checks in place.

## 2022-09-26 DIAGNOSIS — F250 Schizoaffective disorder, bipolar type: Secondary | ICD-10-CM | POA: Diagnosis not present

## 2022-09-26 NOTE — Progress Notes (Signed)
   09/26/22 1610  15 Minute Checks  Location Bedroom  Visual Appearance Calm  Behavior Sleeping  Sleep (Behavioral Health Patients Only)  Calculate sleep? (Click Yes once per 24 hr at 0600 safety check) Yes  Documented sleep last 24 hours 9

## 2022-09-26 NOTE — Progress Notes (Signed)
[facility] MD Progress Note  09/26/2022 10:16 AM [address]  MRN:  [id number] Subjective: Betty Henderson is seen on rounds.  She has been talking to herself.  She has been responding to internal stimuli.  She has been in good controls and compliant with medications.  No side effects.  Nurses report no other issues. Principal Problem: Schizoaffective disorder, bipolar type (HCC) Diagnosis: Principal Problem:   Schizoaffective disorder, bipolar type (HCC)  Total Time spent with patient: 15 minutes  Past Psychiatric History: Schizoaffective disorder, bipolar type  Past Medical History:  Past Medical History:  Diagnosis Date   Anemia    Arthritis    Chronic pain    Drug-seeking behavior    Malingering    Osteopetrosis    Psychosis (HCC)    Schizoaffective disorder, bipolar type (HCC)     Past Surgical History:  Procedure Laterality Date   MOUTH SURGERY     TUBAL LIGATION     Family History:  Family History  Family history unknown: Yes   Family Psychiatric  History: Unremarkable Social History:  Social History   Substance and Sexual Activity  Alcohol Use Not Currently   Comment: 1 cocktail 3 weeks ago     Social History   Substance and Sexual Activity  Drug Use Not Currently   Types: Cocaine   Comment: states "it's legal though"    Social History   Socioeconomic History   Marital status: Single    Spouse name: Not on file   Number of children: Not on file   Years of education: Not on file   Highest education level: Not on file  Occupational History   Not on file  Tobacco Use   Smoking status: Every Day    Packs/day: .5    Types: Cigarettes   Smokeless tobacco: Never  Vaping Use   Vaping Use: Never used  Substance and Sexual Activity   Alcohol use: Not Currently    Comment: 1 cocktail 3 weeks ago   Drug use: Not Currently    Types: Cocaine    Comment: states "it's legal though"   Sexual activity: Never  Other Topics Concern   Not on file  Social History  Narrative   Not on file   Social Determinants of Health   Financial Resource Strain: Not on file  Food Insecurity: Food Insecurity Present (08/22/2022)   Hunger Vital Sign    Worried About Running Out of Food in the Last Year: Sometimes true    Ran Out of Food in the Last Year: Sometimes true  Transportation Needs: Patient Unable To Answer (08/22/2022)   PRAPARE - Administrator, Civil Service (Medical): Patient unable to answer    Lack of Transportation (Non-Medical): Patient unable to answer  Physical Activity: Not on file  Stress: Not on file  Social Connections: Not on file   Additional Social History:                         Sleep: Good  Appetite:  Good  Current Medications: Current Facility-Administered Medications  Medication Dose Route Frequency Provider Last Rate Last Admin   acetaminophen (TYLENOL) tablet 650 mg  650 mg Oral Q6H PRN [name], DO   650 mg at 09/22/22 2130   alum & mag hydroxide-simeth (MAALOX/MYLANTA) 200-200-20 MG/5ML suspension 30 mL  30 mL Oral Q4H PRN [name], DO   30 mL at 09/20/22 1142   diphenhydrAMINE (BENADRYL) capsule 50 mg  50 mg Oral Q6H PRN [name], DO   50 mg at 09/17/22 1727   Or   diphenhydrAMINE (BENADRYL) injection 50 mg  50 mg Intramuscular Q6H PRN [name], DO       haloperidol (HALDOL) tablet 5 mg  5 mg Oral Q6H PRN [name], DO   5 mg at 09/17/22 1727   Or   haloperidol lactate (HALDOL) injection 5 mg  5 mg Intramuscular Q6H PRN [name], DO       ibuprofen (ADVIL) tablet 600 mg  600 mg Oral Q6H PRN [name], [name], MD   600 mg at 09/15/22 1810   LORazepam (ATIVAN) tablet 1 mg  1 mg Oral TID PRN [name], DO   1 mg at 09/17/22 1727   magnesium hydroxide (MILK OF MAGNESIA) suspension 30 mL  30 mL Oral Daily PRN [name], DO       paliperidone (INVEGA) 24 hr tablet 9 mg  9 mg Oral QHS [name],  [name], MD   9 mg at 09/25/22 2117   traZODone (DESYREL) tablet 50 mg  50 mg Oral QHS PRN [name], DO   50 mg at 09/25/22 2117    Lab Results: No results found for this or any previous visit (from the past 48 hour(s)).  Blood Alcohol level:  Lab Results  Component Value Date   ETH <10 08/17/2022   ETH <10 01/11/2021    Metabolic Disorder Labs: No results found for: "HGBA1C", "MPG" No results found for: "PROLACTIN" No results found for: "CHOL", "TRIG", "HDL", "CHOLHDL", "VLDL", "LDLCALC"  Physical Findings: AIMS:  , ,  ,  ,    CIWA:    COWS:     Musculoskeletal: Strength & Muscle Tone: within normal limits Gait & Station: normal Patient leans: N/A  Psychiatric Specialty Exam:  Presentation  General Appearance:  Casual; Neat  Eye Contact: Fair  Speech: Slow; Slurred  Speech Volume: Decreased  Handedness: Right   Mood and Affect  Mood: Anxious  Affect: Inappropriate   Thought Process  Thought Processes: Disorganized  Descriptions of Associations:Loose  Orientation:Partial  Thought Content:Illogical; Rumination  History of Schizophrenia/Schizoaffective disorder:No data recorded Duration of Psychotic Symptoms:No data recorded Hallucinations:No data recorded Ideas of Reference:Paranoia  Suicidal Thoughts:No data recorded Homicidal Thoughts:No data recorded  Sensorium  Memory: Immediate Fair; Remote Poor  Judgment: Poor  Insight: Poor   Executive Functions  Concentration: Poor  Attention Span: Fair  Recall: [employer] of Knowledge: Fair  Language: Fair   Psychomotor Activity  Psychomotor Activity:No data recorded  Assets  Assets: Communication Skills   Sleep  Sleep:No data recorded   Physical Exam: Physical Exam Vitals and nursing note reviewed.  Constitutional:      Appearance: Normal appearance. She is normal weight.  Neurological:     General: No focal deficit present.     Mental  Status: She is alert and oriented to person, place, and time.  Psychiatric:        Attention and Perception: Attention normal. She perceives auditory hallucinations.        Mood and Affect: Mood normal. Affect is labile.        Speech: Speech is tangential.        Behavior: Behavior normal. Behavior is cooperative.        Thought Content: Thought content is paranoid and delusional.        Cognition and Memory: Cognition and memory normal.  Judgment: Judgment is inappropriate.    Review of Systems  Constitutional: Negative.   HENT: Negative.    Eyes: Negative.   Respiratory: Negative.    Cardiovascular: Negative.   Gastrointestinal: Negative.   Genitourinary: Negative.   Musculoskeletal: Negative.   Skin: Negative.   Neurological: Negative.   Endo/Heme/Allergies: Negative.   Psychiatric/Behavioral: Negative.     Blood pressure 109/71, pulse 72, temperature (!) 97.3 F (36.3 C), resp. rate 18, height 5\' 6"  (1.676 m), weight 64 kg, SpO2 100 %. Body mass index is 22.76 kg/m.   Treatment Plan Summary: Daily contact with patient to assess and evaluate symptoms and progress in treatment, Medication management, and Plan continue current medications.  Maryalice Pasley [place] Mall, DO 09/26/2022, 10:16 AM

## 2022-09-26 NOTE — Group Note (Signed)
Recreation Therapy Group Note   Group Topic:Communication  Group Date: 09/26/2022 Start Time: 1300 End Time: 1350 Facilitators: Clinton Gallant, [place] Location:  Day Room  Group Description: Fourth of July Games. LRT and patients played Patriotic Bingo, "Can you name three?", and 4th of July Trivia. Pt won prizes for [employer] of bingo. LRT prompted communication between peers. Patriotic music was playing in the background throughout group.   Goal Area(s) Addressed: Patient will increase communication skills by talking with LRT and peers.  Patient will increase frustration tolerance skills.   Affect/Mood: N/A   Participation Level: Did not attend    Clinical Observations/Individualized Feedback: Roland did not attend group.  Plan: Continue to engage patient in RT group sessions 2-3x/week.   Rosina Lowenstein, LRT, CTRS 09/26/2022 2:23 PM

## 2022-09-26 NOTE — Progress Notes (Signed)
Pt ate all 3 meals today, and has asked for extra food all day. Pt continues to respond to internal stimuli. Pt remains redirectable. She denies S/I or H/I

## 2022-09-26 NOTE — Plan of Care (Addendum)
D- Patient alert and oriented. Denies SI, HI, AVH, and pain. Even though pt denies AVH ,pt can be noted to be responding to internal stimuli.Marland Kitchen   A- Scheduled medications administered to patient, per MD orders. Support and encouragement provided.  Routine safety checks conducted every 15 minutes.  Patient informed to notify staff with problems or concerns.  R- No adverse drug reactions noted. Patient contracts for safety at this time. Patient compliant with medications and treatment plan. Patient receptive, calm, and cooperative. Patient interacts well with others on the unit.  Patient remains safe at this time.

## 2022-09-27 DIAGNOSIS — F250 Schizoaffective disorder, bipolar type: Secondary | ICD-10-CM | POA: Diagnosis not present

## 2022-09-27 MED ORDER — PALIPERIDONE PALMITATE ER 234 MG/1.5ML IM SUSY
234.0000 mg | PREFILLED_SYRINGE | Freq: Once | INTRAMUSCULAR | Status: AC
  Administered 2022-09-27: 234 mg via INTRAMUSCULAR
  Filled 2022-09-27: qty 1.5

## 2022-09-27 NOTE — Progress Notes (Signed)
[facility] MD Progress Note  09/27/2022 11:52 AM Betty Henderson  MRN:  [id number] Subjective: Betty Henderson is seen on rounds.  She has been compliant with medications and denies any side effects.  Social work is still looking for placement.  The meantime of medical had started her on a long-acting injection get that started.  No side effects from her oral Invega.  Restart Invega [address] 234 today.  Principal Problem: Schizoaffective disorder, bipolar type (HCC) Diagnosis: Principal Problem:   Schizoaffective disorder, bipolar type (HCC)  Total Time spent with patient: 15 minutes  Past Psychiatric History: Schizophrenia versus schizoaffective disorder  Past Medical History:  Past Medical History:  Diagnosis Date   Anemia    Arthritis    Chronic pain    Drug-seeking behavior    Malingering    Osteopetrosis    Psychosis (HCC)    Schizoaffective disorder, bipolar type (HCC)     Past Surgical History:  Procedure Laterality Date   MOUTH SURGERY     TUBAL LIGATION     Family History:  Family History  Family history unknown: Yes   Family Psychiatric  History: Unremarkable Social History:  Social History   Substance and Sexual Activity  Alcohol Use Not Currently   Comment: 1 cocktail 3 weeks ago     Social History   Substance and Sexual Activity  Drug Use Not Currently   Types: Cocaine   Comment: states "it's legal though"    Social History   Socioeconomic History   Marital status: Single    Spouse name: Not on file   Number of children: Not on file   Years of education: Not on file   Highest education level: Not on file  Occupational History   Not on file  Tobacco Use   Smoking status: Every Day    Packs/day: .5    Types: Cigarettes   Smokeless tobacco: Never  Vaping Use   Vaping Use: Never used  Substance and Sexual Activity   Alcohol use: Not Currently    Comment: 1 cocktail 3 weeks ago   Drug use: Not Currently    Types: Cocaine    Comment: states "it's legal  though"   Sexual activity: Never  Other Topics Concern   Not on file  Social History Narrative   Not on file   Social Determinants of Health   Financial Resource Strain: Not on file  Food Insecurity: Food Insecurity Present (08/22/2022)   Hunger Vital Sign    Worried About Running Out of Food in the Last Year: Sometimes true    Ran Out of Food in the Last Year: Sometimes true  Transportation Needs: Patient Unable To Answer (08/22/2022)   PRAPARE - Administrator, Civil Service (Medical): Patient unable to answer    Lack of Transportation (Non-Medical): Patient unable to answer  Physical Activity: Not on file  Stress: Not on file  Social Connections: Not on file   Additional Social History:                         Sleep: Good  Appetite:  Good  Current Medications: Current Facility-Administered Medications  Medication Dose Route Frequency Provider Last Rate Last Admin   acetaminophen (TYLENOL) tablet 650 mg  650 mg Oral Q6H PRN [name], DO   650 mg at 09/22/22 2130   alum & mag hydroxide-simeth (MAALOX/MYLANTA) 200-200-20 MG/5ML suspension 30 mL  30 mL Oral Q4H PRN [name], DO  30 mL at 09/20/22 1142   diphenhydrAMINE (BENADRYL) capsule 50 mg  50 mg Oral Q6H PRN [name], DO   50 mg at 09/27/22 0025   Or   diphenhydrAMINE (BENADRYL) injection 50 mg  50 mg Intramuscular Q6H PRN [name], DO       haloperidol (HALDOL) tablet 5 mg  5 mg Oral Q6H PRN [name], DO   5 mg at 09/27/22 0025   Or   haloperidol lactate (HALDOL) injection 5 mg  5 mg Intramuscular Q6H PRN [name], DO       ibuprofen (ADVIL) tablet 600 mg  600 mg Oral Q6H PRN [name], [name], MD   600 mg at 09/15/22 1810   LORazepam (ATIVAN) tablet 1 mg  1 mg Oral TID PRN [name], DO   1 mg at 09/27/22 0025   magnesium hydroxide (MILK OF MAGNESIA) suspension 30 mL  30 mL Oral Daily PRN [name], DO       paliperidone (INVEGA SUSTENNA) injection 234 mg  234 mg Intramuscular Once [name], DO       paliperidone (INVEGA) 24 hr tablet 9 mg  9 mg Oral QHS [name], [name], MD   9 mg at 09/26/22 2056   traZODone (DESYREL) tablet 50 mg  50 mg Oral QHS PRN [name], DO   50 mg at 09/27/22 0025    Lab Results: No results found for this or any previous visit (from the past 48 hour(s)).  Blood Alcohol level:  Lab Results  Component Value Date   ETH <10 08/17/2022   ETH <10 01/11/2021    Metabolic Disorder Labs: No results found for: "HGBA1C", "MPG" No results found for: "PROLACTIN" No results found for: "CHOL", "TRIG", "HDL", "CHOLHDL", "VLDL", "LDLCALC"  Physical Findings: AIMS:  , ,  ,  ,    CIWA:    COWS:     Musculoskeletal: Strength & Muscle Tone: within normal limits Gait & Station: normal Patient leans: N/A  Psychiatric Specialty Exam:  Presentation  General Appearance:  Casual; Neat  Eye Contact: Fair  Speech: Slow; Slurred  Speech Volume: Decreased  Handedness: Right   Mood and Affect  Mood: Anxious  Affect: Inappropriate   Thought Process  Thought Processes: Disorganized  Descriptions of Associations:Loose  Orientation:Partial  Thought Content:Illogical; Rumination  History of Schizophrenia/Schizoaffective disorder:No data recorded Duration of Psychotic Symptoms:No data recorded Hallucinations:No data recorded Ideas of Reference:Paranoia  Suicidal Thoughts:No data recorded Homicidal Thoughts:No data recorded  Sensorium  Memory: Immediate Fair; Remote Poor  Judgment: Poor  Insight: Poor   Executive Functions  Concentration: Poor  Attention Span: Fair  Recall: [employer] of Knowledge: Fair  Language: Fair   Psychomotor Activity  Psychomotor Activity:No data recorded  Assets  Assets: Communication Skills   Sleep  Sleep:No data recorded   Physical  Exam: Physical Exam Vitals and nursing note reviewed.  Constitutional:      Appearance: Normal appearance. She is normal weight.  Neurological:     General: No focal deficit present.     Mental Status: She is alert and oriented to person, place, and time.  Psychiatric:        Attention and Perception: Attention normal. She perceives auditory hallucinations.        Mood and Affect: Mood is anxious.        Speech: Speech is delayed and tangential.        Behavior: Behavior is withdrawn.  Cognition and Memory: Memory normal. Cognition is impaired.        Judgment: Judgment is impulsive and inappropriate.    Review of Systems  Constitutional: Negative.   HENT: Negative.    Eyes: Negative.   Respiratory: Negative.    Cardiovascular: Negative.   Gastrointestinal: Negative.   Genitourinary: Negative.   Musculoskeletal: Negative.   Skin: Negative.   Neurological: Negative.   Endo/Heme/Allergies: Negative.   Psychiatric/Behavioral:  Positive for hallucinations.    Blood pressure (!) 107/57, pulse 66, temperature 98.1 F (36.7 C), resp. rate 18, height 5\' 6"  (1.676 m), weight 64 kg, SpO2 100 %. Body mass index is 22.76 kg/m.   Treatment Plan Summary: Daily contact with patient to assess and evaluate symptoms and progress in treatment, Medication management, and Plan start Invega [address] today.  [name], DO 09/27/2022, 11:52 AM

## 2022-09-27 NOTE — Group Note (Signed)
Date:  09/27/2022 Time:  3:22 PM  Group Topic/Focus:  Managing Feelings:   The focus of this group is to identify what feelings patients have difficulty handling and develop a plan to handle them in a healthier way upon discharge.    Participation Level:  Did Not Attend    Rodena Goldmann 09/27/2022, 3:22 PM

## 2022-09-27 NOTE — Progress Notes (Signed)

## 2022-09-27 NOTE — Group Note (Signed)
[facility] LCSW Group Therapy Note   Group Date: 09/27/2022 Start Time: 1315 End Time: 1355   Type of Therapy and Topic: Group Therapy: Avoiding Self-Sabotaging and Enabling Behaviors  Participation Level: Did Not Attend  Mood:  Description of Group:  In this group, patients will learn how to identify obstacles, self-sabotaging and enabling behaviors, as well as: what are they, why do we do them and what needs these behaviors meet. Discuss unhealthy relationships and how to have positive healthy boundaries with those that sabotage and enable. Explore aspects of self-sabotage and enabling in yourself and how to limit these self-destructive behaviors in everyday life.   Therapeutic Goals: 1. Patient will identify one obstacle that relates to self-sabotage and enabling behaviors 2. Patient will identify one personal self-sabotaging or enabling behavior they did prior to admission 3. Patient will state a plan to change the above identified behavior 4. Patient will demonstrate ability to communicate their needs through discussion and/or role play.    Summary of Patient Progress: Patient declined to attend treatment team at this time.     Therapeutic Modalities:  Cognitive Behavioral Therapy Person-Centered Therapy Motivational Interviewing    Harden Mo, LCSW

## 2022-09-27 NOTE — Group Note (Signed)
Recreation Therapy Group Note   Group Topic:Emotion Expression  Group Date: 09/27/2022 Start Time: 1400 End Time: 1450 Facilitators: Rosina Lowenstein, LRT, CTRS Location:  Dayroom  Group Description: Emotional Check in. Patient sat and talked with LRT about how they are doing and whatever else is on their mind. LRT provided active listening, reassurance and encouragement.     Goal Area(s) Addressed: Patient will engage in conversation with LRT. Patient will communicate their wants, needs, or questions.  Patient will practice a new coping skill of "talking to someone".    Affect/Mood: N/A   Participation Level: Did not attend    Clinical Observations/Individualized Feedback: Betty Henderson did not attend group.  Plan: Continue to engage patient in RT group sessions 2-3x/week.   Rosina Lowenstein, LRT, CTRS 09/27/2022 3:18 PM

## 2022-09-27 NOTE — Progress Notes (Signed)
Pt accepted Invega injection with out difficulty, pt was pleasant and cooperative. Injection given in Left Deltoid.

## 2022-09-27 NOTE — BH IP Treatment Plan (Signed)
Interdisciplinary Treatment and Diagnostic Plan Update  09/27/2022 Time of Session: 10:00AM Gennette Barsky MRN: [id number]  Principal Diagnosis: Schizoaffective disorder, bipolar type [facility])  Secondary Diagnoses: Principal Problem:   Schizoaffective disorder, bipolar type (HCC)   Current Medications:  Current Facility-Administered Medications  Medication Dose Route Frequency Provider Last Rate Last Admin   acetaminophen (TYLENOL) tablet 650 mg  650 mg Oral Q6H PRN [name], DO   650 mg at 09/27/22 1450   alum & mag hydroxide-simeth (MAALOX/MYLANTA) 200-200-20 MG/5ML suspension 30 mL  30 mL Oral Q4H PRN [name], DO   30 mL at 09/20/22 1142   diphenhydrAMINE (BENADRYL) capsule 50 mg  50 mg Oral Q6H PRN [name], DO   50 mg at 09/27/22 0025   Or   diphenhydrAMINE (BENADRYL) injection 50 mg  50 mg Intramuscular Q6H PRN [name], DO       haloperidol (HALDOL) tablet 5 mg  5 mg Oral Q6H PRN [name], DO   5 mg at 09/27/22 0025   Or   haloperidol lactate (HALDOL) injection 5 mg  5 mg Intramuscular Q6H PRN [name], DO       ibuprofen (ADVIL) tablet 600 mg  600 mg Oral Q6H PRN [name], [name], MD   600 mg at 09/15/22 1810   LORazepam (ATIVAN) tablet 1 mg  1 mg Oral TID PRN [name], DO   1 mg at 09/27/22 0025   magnesium hydroxide (MILK OF MAGNESIA) suspension 30 mL  30 mL Oral Daily PRN [name], DO       paliperidone (INVEGA) 24 hr tablet 9 mg  9 mg Oral QHS [name], [name], MD   9 mg at 09/26/22 2056   traZODone (DESYREL) tablet 50 mg  50 mg Oral QHS PRN [name], DO   50 mg at 09/27/22 0025   PTA Medications: Medications Prior to Admission  Medication Sig Dispense Refill Last Dose   ondansetron (ZOFRAN) 4 MG tablet Take 1 tablet (4 mg total) by mouth every 8 (eight) hours as needed for nausea or vomiting. (Patient not taking: Reported on 08/17/2022) 20  tablet 0     Patient Stressors: Financial difficulties   Health problems   Medication change or noncompliance    Patient Strengths: Ability for insight   Treatment Modalities: Medication Management, Group therapy, Case management,  1 to 1 session with clinician, Psychoeducation, Recreational therapy.   Physician Treatment Plan for Primary Diagnosis: Schizoaffective disorder, bipolar type (HCC) Long Term Goal(s): Improvement in symptoms so as ready for discharge   Short Term Goals: Ability to identify changes in lifestyle to reduce recurrence of condition will improve Ability to verbalize feelings will improve Ability to disclose and discuss suicidal ideas Ability to demonstrate self-control will improve Ability to identify and develop effective coping behaviors will improve Ability to maintain clinical measurements within normal limits will improve Compliance with prescribed medications will improve Ability to identify triggers associated with substance abuse/mental health issues will improve  Medication Management: Evaluate patient's response, side effects, and tolerance of medication regimen.  Therapeutic Interventions: 1 to 1 sessions, Unit Group sessions and Medication administration.  Evaluation of Outcomes: Progressing  Physician Treatment Plan for Secondary Diagnosis: Principal Problem:   Schizoaffective disorder, bipolar type (HCC)  Long Term Goal(s): Improvement in symptoms so as ready for discharge   Short Term Goals: Ability to identify changes in lifestyle to reduce recurrence of condition will improve Ability to  verbalize feelings will improve Ability to disclose and discuss suicidal ideas Ability to demonstrate self-control will improve Ability to identify and develop effective coping behaviors will improve Ability to maintain clinical measurements within normal limits will improve Compliance with prescribed medications will improve Ability to identify  triggers associated with substance abuse/mental health issues will improve     Medication Management: Evaluate patient's response, side effects, and tolerance of medication regimen.  Therapeutic Interventions: 1 to 1 sessions, Unit Group sessions and Medication administration.  Evaluation of Outcomes: Progressing   RN Treatment Plan for Primary Diagnosis: Schizoaffective disorder, bipolar type (HCC) Long Term Goal(s): Knowledge of disease and therapeutic regimen to maintain health will improve  Short Term Goals: Ability to demonstrate self-control, Ability to participate in decision making will improve, Ability to verbalize feelings will improve, Ability to disclose and discuss suicidal ideas, Ability to identify and develop effective coping behaviors will improve, and Compliance with prescribed medications will improve  Medication Management: RN will administer medications as ordered by provider, will assess and evaluate patient's response and provide education to patient for prescribed medication. RN will report any adverse and/or side effects to prescribing provider.  Therapeutic Interventions: 1 on 1 counseling sessions, Psychoeducation, Medication administration, Evaluate responses to treatment, Monitor vital signs and CBGs as ordered, Perform/monitor CIWA, COWS, AIMS and Fall Risk screenings as ordered, Perform wound care treatments as ordered.  Evaluation of Outcomes: Progressing   LCSW Treatment Plan for Primary Diagnosis: Schizoaffective disorder, bipolar type (HCC) Long Term Goal(s): Safe transition to appropriate next level of care at discharge, Engage patient in therapeutic group addressing interpersonal concerns.  Short Term Goals: Engage patient in aftercare planning with referrals and resources, Increase social support, Increase ability to appropriately verbalize feelings, Increase emotional regulation, Facilitate acceptance of mental health diagnosis and concerns, and Increase  skills for wellness and recovery  Therapeutic Interventions: Assess for all discharge needs, 1 to 1 time with Social worker, Explore available resources and support systems, Assess for adequacy in community support network, Educate family and significant other(s) on suicide prevention, Complete Psychosocial Assessment, Interpersonal group therapy.  Evaluation of Outcomes: Progressing   Progress in Treatment: Attending groups: Yes. Participating in groups: No. Taking medication as prescribed: Yes. Toleration medication: Yes. Family/Significant other contact made: Yes, individual(s) contacted:  SPE completed with the patient's son. Patient understands diagnosis: No. Discussing patient identified problems/goals with staff: Yes. Medical problems stabilized or resolved: Yes. Denies suicidal/homicidal ideation: Yes. Issues/concerns per patient self-inventory: No. Other: none  New problem(s) identified: No, Describe:  none Update 09/27/2022:  No changes at this time.   New Short Term/Long Term Goal(s): Update 6/30: none at this time. Update 09/27/2022:  No changes at this time.   Patient Goals:  Update 6/30: none at this time. Update 09/27/2022:  No changes at this time.   Discharge Plan or Barriers: Update 6/30: APS report has been made and patient is being investigated for guardianship needs.  Remains homeless with limited supports. Update 09/27/2022:  No changes at this time.   Reason for Continuation of Hospitalization: Hallucinations Mania Medication stabilization   Estimated Length of Stay: Update 6/30: 1-7 days Update 09/27/2022:  TBD  Last 3 [place] Suicide Severity Risk Score: Flowsheet Row Admission (Current) from 08/22/2022 in [facility] from 08/17/2022 in [facility] Emergency Department at [facility] ED from 08/12/2022 in [facility] Emergency Department at [facility]  C-SSRS RISK CATEGORY Low Risk No Risk No Risk  Last PHQ 2/9  Scores:     No data to display          Scribe for Treatment Team: Gaylan Gerold 09/27/2022 3:51 PM

## 2022-09-27 NOTE — Plan of Care (Signed)
D- Patient alert and oriented. Pt has been isolating this morning.. Denies SI, HI, AVH, and pain. Pt is compliant with staff direction.   A- Scheduled medications administered to patient, per MD orders. Support and encouragement provided.  Routine safety checks conducted every 15 minutes.  Patient informed to notify staff with problems or concerns.   R- No adverse drug reactions noted. Patient contracts for safety at this time. Patient compliant with medications and treatment plan. Patient receptive, calm, and cooperative. Patient interacts well with others on the unit.  Patient remains safe at this time.

## 2022-09-28 DIAGNOSIS — F250 Schizoaffective disorder, bipolar type: Secondary | ICD-10-CM | POA: Diagnosis not present

## 2022-09-28 MED ORDER — PALIPERIDONE ER 3 MG PO TB24
6.0000 mg | ORAL_TABLET | Freq: Every day | ORAL | Status: DC
  Administered 2022-09-28 – 2022-10-13 (×16): 6 mg via ORAL
  Filled 2022-09-28 (×16): qty 2

## 2022-09-28 NOTE — Plan of Care (Signed)

## 2022-09-28 NOTE — Group Note (Signed)
Date:  09/28/2022 Time:  11:09 AM  Group Topic/Focus:  Self Care:   The focus of this group is to help patients understand the importance of self-care in order to improve or restore emotional, physical, spiritual, interpersonal, and financial health.    Participation Level:  Did Not Attend   Betty Henderson 09/28/2022, 11:09 AM

## 2022-09-28 NOTE — Group Note (Signed)
Date:  09/28/2022 Time:  10:46 AM  Group Topic/Focus:  Developing a Wellness Toolbox:   The focus of this group is to help patients develop a "wellness toolbox" with skills and strategies to promote recovery upon discharge. Rediscovering Joy:   The focus of this group is to explore various ways to relieve stress in a positive manner. Self Care:   The focus of this group is to help patients understand the importance of self-care in order to improve or restore emotional, physical, spiritual, interpersonal, and financial health.    Participation Level:  Active  Participation Quality:  Appropriate  Affect:  Excited  Cognitive:  Disorganized  Insight: Appropriate  Engagement in Group:  Engaged  Modes of Intervention:  Activity  Additional Comments:  Patient sang along to the music and enjoyed herself.  [name] 09/28/2022, 10:46 AM

## 2022-09-28 NOTE — Group Note (Signed)
Date:  09/28/2022 Time:  9:22 PM  Group Topic/Focus:  Healthy Communication:   The focus of this group is to discuss communication, barriers to communication, as well as healthy ways to communicate with others.    Participation Level:  Did Not Attend  Participation Quality:   none  Affect:   none  Cognitive:   none  Insight: None  Engagement in Group:   none  Modes of Intervention:   none  Additional Comments: Betty Henderson did not attend group.  [name] 09/28/2022, 9:22 PM

## 2022-09-28 NOTE — Plan of Care (Signed)

## 2022-09-28 NOTE — Progress Notes (Signed)
Patient present in the dayroom for breakfast.  Flat affect. Forwards little information.  Denies SI and HI.  No scheduled medications.  15 min checks in place for safety.  Patient returned to her room after eating. Minimal interaction with peers and staff.    Appears to be responding to internal stimulation while in the dayroom. Talking loudly, cursing and agitated.  When asked by staff how she was feeling, patient states, "I'm good."  Patient was able to calm herself down within a couple of minutes.

## 2022-09-28 NOTE — Progress Notes (Signed)
[facility] MD Progress Note  09/28/2022 12:05 PM Betty Henderson  MRN:  [id number] Subjective: Betty Henderson is seen on rounds.  She was compliant with her [place] [place] [address] yesterday.  I will go ahead and decrease her oral to 6 mg at bedtime.  No side effects noticed and she denies any side effects.  Nurses report no issues.  She still internally preoccupied at times Principal Problem: Schizoaffective disorder, bipolar type (HCC) Diagnosis: Principal Problem:   Schizoaffective disorder, bipolar type (HCC)  Total Time spent with patient: 15 minutes  Past Psychiatric History: Unremarkable  Past Medical History:  Past Medical History:  Diagnosis Date   Anemia    Arthritis    Chronic pain    Drug-seeking behavior    Malingering    Osteopetrosis    Psychosis (HCC)    Schizoaffective disorder, bipolar type (HCC)     Past Surgical History:  Procedure Laterality Date   MOUTH SURGERY     TUBAL LIGATION     Family History:  Family History  Family history unknown: Yes   Family Psychiatric  History: Unremarkable Social History:  Social History   Substance and Sexual Activity  Alcohol Use Not Currently   Comment: 1 cocktail 3 weeks ago     Social History   Substance and Sexual Activity  Drug Use Not Currently   Types: Cocaine   Comment: states "it's legal though"    Social History   Socioeconomic History   Marital status: Single    Spouse name: Not on file   Number of children: Not on file   Years of education: Not on file   Highest education level: Not on file  Occupational History   Not on file  Tobacco Use   Smoking status: Every Day    Packs/day: .5    Types: Cigarettes   Smokeless tobacco: Never  Vaping Use   Vaping Use: Never used  Substance and Sexual Activity   Alcohol use: Not Currently    Comment: 1 cocktail 3 weeks ago   Drug use: Not Currently    Types: Cocaine    Comment: states "it's legal though"   Sexual activity: Never  Other Topics Concern   Not on  file  Social History Narrative   Not on file   Social Determinants of Health   Financial Resource Strain: Not on file  Food Insecurity: Food Insecurity Present (08/22/2022)   Hunger Vital Sign    Worried About Running Out of Food in the Last Year: Sometimes true    Ran Out of Food in the Last Year: Sometimes true  Transportation Needs: Patient Unable To Answer (08/22/2022)   PRAPARE - Administrator, Civil Service (Medical): Patient unable to answer    Lack of Transportation (Non-Medical): Patient unable to answer  Physical Activity: Not on file  Stress: Not on file  Social Connections: Not on file   Additional Social History:                         Sleep: Good  Appetite:  Good  Current Medications: Current Facility-Administered Medications  Medication Dose Route Frequency Provider Last Rate Last Admin   acetaminophen (TYLENOL) tablet 650 mg  650 mg Oral Q6H PRN [name], DO   650 mg at 09/28/22 0626   alum & mag hydroxide-simeth (MAALOX/MYLANTA) 200-200-20 MG/5ML suspension 30 mL  30 mL Oral Q4H PRN [name], DO   30 mL at 09/20/22  1142   diphenhydrAMINE (BENADRYL) capsule 50 mg  50 mg Oral Q6H PRN [name], DO   50 mg at 09/27/22 0025   Or   diphenhydrAMINE (BENADRYL) injection 50 mg  50 mg Intramuscular Q6H PRN [name], DO       haloperidol (HALDOL) tablet 5 mg  5 mg Oral Q6H PRN [name], DO   5 mg at 09/27/22 0025   Or   haloperidol lactate (HALDOL) injection 5 mg  5 mg Intramuscular Q6H PRN [name], DO       ibuprofen (ADVIL) tablet 600 mg  600 mg Oral Q6H PRN [name], [name], MD   600 mg at 09/15/22 1810   LORazepam (ATIVAN) tablet 1 mg  1 mg Oral TID PRN [name], DO   1 mg at 09/27/22 0025   magnesium hydroxide (MILK OF MAGNESIA) suspension 30 mL  30 mL Oral Daily PRN [name], DO       paliperidone (INVEGA) 24 hr tablet 6 mg  6  mg Oral QHS [name], DO       traZODone (DESYREL) tablet 50 mg  50 mg Oral QHS PRN [name], DO   50 mg at 09/27/22 0025    Lab Results: No results found for this or any previous visit (from the past 48 hour(s)).  Blood Alcohol level:  Lab Results  Component Value Date   ETH <10 08/17/2022   ETH <10 01/11/2021    Metabolic Disorder Labs: No results found for: "HGBA1C", "MPG" No results found for: "PROLACTIN" No results found for: "CHOL", "TRIG", "HDL", "CHOLHDL", "VLDL", "LDLCALC"  Physical Findings: AIMS:  , ,  ,  ,    CIWA:    COWS:     Musculoskeletal: Strength & Muscle Tone: within normal limits Gait & Station: normal Patient leans: N/A  Psychiatric Specialty Exam:  Presentation  General Appearance:  Casual; Neat  Eye Contact: Fair  Speech: Slow; Slurred  Speech Volume: Decreased  Handedness: Right   Mood and Affect  Mood: Anxious  Affect: Inappropriate   Thought Process  Thought Processes: Disorganized  Descriptions of Associations:Loose  Orientation:Partial  Thought Content:Illogical; Rumination  History of Schizophrenia/Schizoaffective disorder:No data recorded Duration of Psychotic Symptoms:No data recorded Hallucinations:No data recorded Ideas of Reference:Paranoia  Suicidal Thoughts:No data recorded Homicidal Thoughts:No data recorded  Sensorium  Memory: Immediate Fair; Remote Poor  Judgment: Poor  Insight: Poor   Executive Functions  Concentration: Poor  Attention Span: Fair  Recall: [employer] of Knowledge: Fair  Language: Fair   Psychomotor Activity  Psychomotor Activity:No data recorded  Assets  Assets: Communication Skills   Sleep  Sleep:No data recorded   Physical Exam: Physical Exam Vitals and nursing note reviewed.  Constitutional:      Appearance: Normal appearance. She is normal weight.  Neurological:     General: No focal deficit present.      Mental Status: She is alert and oriented to person, place, and time.  Psychiatric:        Attention and Perception: Attention normal. She perceives auditory hallucinations.        Mood and Affect: Mood normal. Affect is labile.        Speech: Speech normal.        Behavior: Behavior normal. Behavior is cooperative.        Thought Content: Thought content is paranoid and delusional.        Cognition and Memory: Memory normal.  Cognition is impaired.        Judgment: Judgment is impulsive and inappropriate.    Review of Systems  Constitutional: Negative.   HENT: Negative.    Eyes: Negative.   Respiratory: Negative.    Cardiovascular: Negative.   Gastrointestinal: Negative.   Genitourinary: Negative.   Musculoskeletal: Negative.   Skin: Negative.   Neurological: Negative.   Endo/Heme/Allergies: Negative.   Psychiatric/Behavioral:  Positive for hallucinations.    Blood pressure (!) 120/55, pulse 77, temperature 97.9 F (36.6 C), resp. rate 18, height 5\' 6"  (1.676 m), weight 64 kg, SpO2 100 %. Body mass index is 22.76 kg/m.   Treatment Plan Summary: Daily contact with patient to assess and evaluate symptoms and progress in treatment, Medication management, and Plan decrease oral Invega to 6 mg at bedtime.  [name], DO 09/28/2022, 12:05 PM

## 2022-09-28 NOTE — Group Note (Signed)
LCSW Group Therapy Note   Group Date: 09/28/2022 Start Time: 1300 End Time: 1345   Type of Therapy and Topic:  Group Therapy:  Geriatric Depression  Participation Level:  Did Not Attend    Summary of Patient Progress:  The patient did not attend.     Marshell Levan, LCSWA 09/28/2022  2:07 PM

## 2022-09-29 DIAGNOSIS — F250 Schizoaffective disorder, bipolar type: Secondary | ICD-10-CM | POA: Diagnosis not present

## 2022-09-29 NOTE — Group Note (Signed)
Date:  09/29/2022 Time:  9:13 PM  Group Topic/Focus:  Coping With Mental Health Crisis:   The purpose of this group is to help patients identify strategies for coping with mental health crisis.  Group discusses possible causes of crisis and ways to manage them effectively.    Participation Level:  Did Not Attend  Participation Quality:  Resistant  Affect:   Did Not Attend  Cognitive:  Alert  Insight: None  Engagement in Group:  Resistant  Modes of Intervention:  Confrontation  Additional Comments:    [name] 09/29/2022, 9:13 PM

## 2022-09-29 NOTE — Progress Notes (Signed)
Patient present in the dayroom for breakfast.  Flat affect. Good appetite noted.  Denies SI/HI.  No scheduled medications this shift.  15 min checks in place for safety.  Patient returned to her room after breakfast to rest. Patient appears to be responding to internal stimuli periodically throughout the shift.  No PRN medication needed. Minimal interaction with peers when in milieu.

## 2022-09-29 NOTE — Progress Notes (Signed)
[facility] MD Progress Note  09/29/2022 1:02 PM Betty Henderson  MRN:  [id number] Subjective: Betty Henderson is seen on rounds.  She received her Gean Birchwood 234 couple days ago.  No side affects.  She has been pleasant and cooperative and in good controls.  Nurses report no issues.  She is doing well. Principal Problem: Schizoaffective disorder, bipolar type (HCC) Diagnosis: Principal Problem:   Schizoaffective disorder, bipolar type (HCC)  Total Time spent with patient: 15 minutes  Past Psychiatric History: Schizoaffective disorder, bipolar type.  Past Medical History:  Past Medical History:  Diagnosis Date   Anemia    Arthritis    Chronic pain    Drug-seeking behavior    Malingering    Osteopetrosis    Psychosis (HCC)    Schizoaffective disorder, bipolar type (HCC)     Past Surgical History:  Procedure Laterality Date   MOUTH SURGERY     TUBAL LIGATION     Family History:  Family History  Family history unknown: Yes   Family Psychiatric  History: Unremarkable Social History:  Social History   Substance and Sexual Activity  Alcohol Use Not Currently   Comment: 1 cocktail 3 weeks ago     Social History   Substance and Sexual Activity  Drug Use Not Currently   Types: Cocaine   Comment: states "it's legal though"    Social History   Socioeconomic History   Marital status: Single    Spouse name: Not on file   Number of children: Not on file   Years of education: Not on file   Highest education level: Not on file  Occupational History   Not on file  Tobacco Use   Smoking status: Every Day    Packs/day: .5    Types: Cigarettes   Smokeless tobacco: Never  Vaping Use   Vaping Use: Never used  Substance and Sexual Activity   Alcohol use: Not Currently    Comment: 1 cocktail 3 weeks ago   Drug use: Not Currently    Types: Cocaine    Comment: states "it's legal though"   Sexual activity: Never  Other Topics Concern   Not on file  Social History Narrative   Not  on file   Social Determinants of Health   Financial Resource Strain: Not on file  Food Insecurity: Food Insecurity Present (08/22/2022)   Hunger Vital Sign    Worried About Running Out of Food in the Last Year: Sometimes true    Ran Out of Food in the Last Year: Sometimes true  Transportation Needs: Patient Unable To Answer (08/22/2022)   PRAPARE - Administrator, Civil Service (Medical): Patient unable to answer    Lack of Transportation (Non-Medical): Patient unable to answer  Physical Activity: Not on file  Stress: Not on file  Social Connections: Not on file   Additional Social History:                         Sleep: Good  Appetite:  Good  Current Medications: Current Facility-Administered Medications  Medication Dose Route Frequency Provider Last Rate Last Admin   acetaminophen (TYLENOL) tablet 650 mg  650 mg Oral Q6H PRN [name], DO   650 mg at 09/28/22 0626   alum & mag hydroxide-simeth (MAALOX/MYLANTA) 200-200-20 MG/5ML suspension 30 mL  30 mL Oral Q4H PRN [name], DO   30 mL at 09/20/22 1142   diphenhydrAMINE (BENADRYL) capsule 50 mg  50 mg Oral Q6H PRN [name], DO   50 mg at 09/28/22 2149   Or   diphenhydrAMINE (BENADRYL) injection 50 mg  50 mg Intramuscular Q6H PRN [name], DO       haloperidol (HALDOL) tablet 5 mg  5 mg Oral Q6H PRN [name], DO   5 mg at 09/28/22 2149   Or   haloperidol lactate (HALDOL) injection 5 mg  5 mg Intramuscular Q6H PRN [name], DO       ibuprofen (ADVIL) tablet 600 mg  600 mg Oral Q6H PRN [name], [name], MD   600 mg at 09/28/22 2149   LORazepam (ATIVAN) tablet 1 mg  1 mg Oral TID PRN [name], DO   1 mg at 09/27/22 0025   magnesium hydroxide (MILK OF MAGNESIA) suspension 30 mL  30 mL Oral Daily PRN [name], DO       paliperidone (INVEGA) 24 hr tablet 6 mg  6 mg Oral QHS [name],  DO   6 mg at 09/28/22 2133   traZODone (DESYREL) tablet 50 mg  50 mg Oral QHS PRN [name], DO   50 mg at 09/28/22 2133    Lab Results: No results found for this or any previous visit (from the past 48 hour(s)).  Blood Alcohol level:  Lab Results  Component Value Date   ETH <10 08/17/2022   ETH <10 01/11/2021    Metabolic Disorder Labs: No results found for: "HGBA1C", "MPG" No results found for: "PROLACTIN" No results found for: "CHOL", "TRIG", "HDL", "CHOLHDL", "VLDL", "LDLCALC"  Physical Findings: AIMS:  , ,  ,  ,    CIWA:    COWS:     Musculoskeletal: Strength & Muscle Tone: within normal limits Gait & Station: normal Patient leans: N/A  Psychiatric Specialty Exam:  Presentation  General Appearance:  Casual; Neat  Eye Contact: Fair  Speech: Slow; Slurred  Speech Volume: Decreased  Handedness: Right   Mood and Affect  Mood: Anxious  Affect: Inappropriate   Thought Process  Thought Processes: Disorganized  Descriptions of Associations:Loose  Orientation:Partial  Thought Content:Illogical; Rumination  History of Schizophrenia/Schizoaffective disorder:No data recorded Duration of Psychotic Symptoms:No data recorded Hallucinations:No data recorded Ideas of Reference:Paranoia  Suicidal Thoughts:No data recorded Homicidal Thoughts:No data recorded  Sensorium  Memory: Immediate Fair; Remote Poor  Judgment: Poor  Insight: Poor   Executive Functions  Concentration: Poor  Attention Span: Fair  Recall: [employer] of Knowledge: Fair  Language: Fair   Psychomotor Activity  Psychomotor Activity:No data recorded  Assets  Assets: Communication Skills   Sleep  Sleep:No data recorded    Blood pressure 119/70, pulse 79, temperature (!) 97.5 F (36.4 C), resp. rate 20, height 5\' 6"  (1.676 m), weight 64 kg, SpO2 100 %. Body mass index is 22.76 kg/m.   Treatment Plan Summary: Daily contact with patient  to assess and evaluate symptoms and progress in treatment, Medication management, and Plan continue current medications.  Reizy Dunlow Tresea Mall, DO 09/29/2022, 1:02 PM

## 2022-09-29 NOTE — Plan of Care (Signed)
  Problem: Coping: Goal: Level of anxiety will decrease Outcome: Progressing   Problem: Pain Managment: Goal: General experience of comfort will improve Outcome: Progressing   Problem: Safety: Goal: Ability to remain free from injury will improve Outcome: Progressing   Problem: Skin Integrity: Goal: Risk for impaired skin integrity will decrease Outcome: Progressing   

## 2022-09-29 NOTE — Group Note (Signed)
Date:  09/29/2022 Time:  2:33 PM  Group Topic/Focus:  Self Care:   The focus of this group is to help patients understand the importance of self-care in order to improve or restore emotional, physical, spiritual, interpersonal, and financial health.    Participation Level:  Active  Participation Quality:  Appropriate  Affect:  Appropriate  Cognitive:  Appropriate  Insight: Appropriate  Engagement in Group:  Engaged  Modes of Intervention:  Activity  Additional Comments:  None  Rodena Goldmann 09/29/2022, 2:33 PM

## 2022-09-29 NOTE — Plan of Care (Signed)
Pt is loud, talking and laughing to herself. RIS. C/o left arm pain and racing thoughts. Ibuprofen 600 mg po, Haldol 5 mg po and Benadryl 50 mg po given as ordered PRN, respectively. Q15 min provided. Denies SI/HI. Plan of care continued.   Problem: Coping: Goal: Level of anxiety will decrease Outcome: Progressing   Problem: Elimination: Goal: Will not experience complications related to bowel motility Outcome: Progressing Goal: Will not experience complications related to urinary retention Outcome: Progressing   Problem: Pain Managment: Goal: General experience of comfort will improve Outcome: Progressing   Problem: Safety: Goal: Ability to remain free from injury will improve Outcome: Progressing

## 2022-09-30 DIAGNOSIS — F250 Schizoaffective disorder, bipolar type: Secondary | ICD-10-CM | POA: Diagnosis not present

## 2022-09-30 NOTE — Group Note (Signed)
[facility] LCSW Group Therapy Note    Group Date: 09/30/2022 Start Time: 1315 End Time: 1350  Type of Therapy and Topic:  Group Therapy:  Overcoming Obstacles  Participation Level:  [facility] PARTICIPATION LEVEL: Minimal  Mood:  Description of Group:   In this group patients will be encouraged to explore what they see as obstacles to their own wellness and recovery. They will be guided to discuss their thoughts, feelings, and behaviors related to these obstacles. The group will process together ways to cope with barriers, with attention given to specific choices patients can make. Each patient will be challenged to identify changes they are motivated to make in order to overcome their obstacles. This group will be process-oriented, with patients participating in exploration of their own experiences as well as giving and receiving support and challenge from other group members.  Therapeutic Goals: 1. Patient will identify personal and current obstacles as they relate to admission. 2. Patient will identify barriers that currently interfere with their wellness or overcoming obstacles.  3. Patient will identify feelings, thought process and behaviors related to these barriers. 4. Patient will identify two changes they are willing to make to overcome these obstacles:    Summary of Patient Progress   Pt discussed obstacles such as illusions that can contribute to difficulties overcoming obstacles. Pt was unable to express ways to overcome obstacles but could identify mental health "obstacle illusions" as a barrier.    Therapeutic Modalities:   Cognitive Behavioral Therapy Solution Focused Therapy Motivational Interviewing Relapse Prevention Therapy   Elza Rafter, LCSWA

## 2022-09-30 NOTE — Group Note (Signed)
Recreation Therapy Group Note   Group Topic:Leisure Education  Group Date: 09/30/2022 Start Time: 1400 End Time: 1500 Facilitators: Rosina Lowenstein, LRT, CTRS Location:  Day Room  Group Description: Leisure. Patients were given the option to choose from coloring, singing karaoke, journaling or listening to music. LRT and pts discussed the meaning of leisure, the importance of participating in leisure during their free time/when they're outside of the hospital, as well as how our leisure interests can also serve as coping skills. Pt identified two leisure interests and shared with the group.   Goal Area(s) Addressed:  Patient will identify a current leisure interest.  Patient will learn the definition of "leisure". Patient will practice making a positive decision. Patient will have the opportunity to try a new leisure activity. Patient will communicate with peers and LRT.   Affect/Mood: Appropriate and Full range   Participation Level: Active and Engaged   Participation Quality: Minimal Cues   Behavior: Alert and Cooperative   Speech/Thought Process: Loose association   Insight: Fair   Judgement: Fair    Modes of Intervention: Activity   Patient Response to Interventions:  Receptive   Education Outcome:  Acknowledges education   Clinical Observations/Individualized Feedback: Shaeleigh was active in their participation of session activities and group discussion. Pt identified "sing and entertainment" as things she does in her free time. Pt chose to sing karaoke independently and with a peer while in group. Pt interacted well with LRT and peers duration of session, while requiring minimal cues to lower her voice as she was responding to internal stimuli.    Plan: Continue to engage patient in RT group sessions 2-3x/week.   Rosina Lowenstein, LRT, CTRS 09/30/2022 3:19 PM

## 2022-09-30 NOTE — Progress Notes (Signed)
   09/30/22 1800  Psych Admission Type (Psych Patients Only)  Admission Status Voluntary  Psychosocial Assessment  Patient Complaints None  Eye Contact Fair  Facial Expression Flat  Affect Labile  Speech Loud  Interaction Minimal  Motor Activity Restless  Appearance/Hygiene Layered clothes  Behavior Characteristics Restless  Mood Pleasant  Thought Process  Coherency Disorganized  Content Preoccupation  Delusions None reported or observed  Perception Hallucinations  Hallucination Auditory;Visual  Judgment Impaired  Confusion Mild  Danger to Self  Current suicidal ideation? Denies  Agreement Not to Harm Self Yes  Description of Agreement verbal  Danger to Others  Danger to Others None reported or observed  Danger to Others Abnormal  Harmful Behavior to others No threats or harm toward other people  Destructive Behavior No threats or harm toward property

## 2022-09-30 NOTE — Progress Notes (Signed)
[facility] MD Progress Note  09/30/2022 1:49 PM Betty Henderson  MRN:  [id number] Subjective: Betty Henderson is seen on rounds.  She has poor insight and asks to be discharged.  Told her she has a guardian and social work have not found assisted living.  She has been compliant with medications without any problems.  She will need Gean Birchwood when we find out where she is going. Principal Problem: Schizoaffective disorder, bipolar type (HCC) Diagnosis: Principal Problem:   Schizoaffective disorder, bipolar type (HCC)  Total Time spent with patient: 15 minutes  Past Psychiatric History: Schizophrenia versus schizoaffective disorder  Past Medical History:  Past Medical History:  Diagnosis Date   Anemia    Arthritis    Chronic pain    Drug-seeking behavior    Malingering    Osteopetrosis    Psychosis (HCC)    Schizoaffective disorder, bipolar type (HCC)     Past Surgical History:  Procedure Laterality Date   MOUTH SURGERY     TUBAL LIGATION     Family History:  Family History  Family history unknown: Yes   Family Psychiatric  History: Unremarkable Social History:  Social History   Substance and Sexual Activity  Alcohol Use Not Currently   Comment: 1 cocktail 3 weeks ago     Social History   Substance and Sexual Activity  Drug Use Not Currently   Types: Cocaine   Comment: states "it's legal though"    Social History   Socioeconomic History   Marital status: Single    Spouse name: Not on file   Number of children: Not on file   Years of education: Not on file   Highest education level: Not on file  Occupational History   Not on file  Tobacco Use   Smoking status: Every Day    Packs/day: .5    Types: Cigarettes   Smokeless tobacco: Never  Vaping Use   Vaping Use: Never used  Substance and Sexual Activity   Alcohol use: Not Currently    Comment: 1 cocktail 3 weeks ago   Drug use: Not Currently    Types: Cocaine    Comment: states "it's legal though"   Sexual  activity: Never  Other Topics Concern   Not on file  Social History Narrative   Not on file   Social Determinants of Health   Financial Resource Strain: Not on file  Food Insecurity: Food Insecurity Present (08/22/2022)   Hunger Vital Sign    Worried About Running Out of Food in the Last Year: Sometimes true    Ran Out of Food in the Last Year: Sometimes true  Transportation Needs: Patient Unable To Answer (08/22/2022)   PRAPARE - Administrator, Civil Service (Medical): Patient unable to answer    Lack of Transportation (Non-Medical): Patient unable to answer  Physical Activity: Not on file  Stress: Not on file  Social Connections: Not on file   Additional Social History:                         Sleep: Good  Appetite:  Good  Current Medications: Current Facility-Administered Medications  Medication Dose Route Frequency Provider Last Rate Last Admin   acetaminophen (TYLENOL) tablet 650 mg  650 mg Oral Q6H PRN [name], DO   650 mg at 09/28/22 0626   alum & mag hydroxide-simeth (MAALOX/MYLANTA) 200-200-20 MG/5ML suspension 30 mL  30 mL Oral Q4H PRN [name], DO  30 mL at 09/20/22 1142   diphenhydrAMINE (BENADRYL) capsule 50 mg  50 mg Oral Q6H PRN [name], DO   50 mg at 09/29/22 2254   Or   diphenhydrAMINE (BENADRYL) injection 50 mg  50 mg Intramuscular Q6H PRN [name], DO       haloperidol (HALDOL) tablet 5 mg  5 mg Oral Q6H PRN [name], DO   5 mg at 09/28/22 2149   Or   haloperidol lactate (HALDOL) injection 5 mg  5 mg Intramuscular Q6H PRN [name], DO       ibuprofen (ADVIL) tablet 600 mg  600 mg Oral Q6H PRN [name], [name], MD   600 mg at 09/29/22 2254   LORazepam (ATIVAN) tablet 1 mg  1 mg Oral TID PRN [name], DO   1 mg at 09/27/22 0025   magnesium hydroxide (MILK OF MAGNESIA) suspension 30 mL  30 mL Oral Daily PRN [name], DO        paliperidone (INVEGA) 24 hr tablet 6 mg  6 mg Oral QHS [name], DO   6 mg at 09/29/22 2156   traZODone (DESYREL) tablet 50 mg  50 mg Oral QHS PRN [name], DO   50 mg at 09/29/22 2156    Lab Results: No results found for this or any previous visit (from the past 48 hour(s)).  Blood Alcohol level:  Lab Results  Component Value Date   ETH <10 08/17/2022   ETH <10 01/11/2021    Metabolic Disorder Labs: No results found for: "HGBA1C", "MPG" No results found for: "PROLACTIN" No results found for: "CHOL", "TRIG", "HDL", "CHOLHDL", "VLDL", "LDLCALC"  Physical Findings: AIMS:  , ,  ,  ,    CIWA:    COWS:     Musculoskeletal: Strength & Muscle Tone: within normal limits Gait & Station: normal Patient leans: N/A  Psychiatric Specialty Exam:  Presentation  General Appearance:  Casual; Neat  Eye Contact: Fair  Speech: Slow; Slurred  Speech Volume: Decreased  Handedness: Right   Mood and Affect  Mood: Anxious  Affect: Inappropriate   Thought Process  Thought Processes: Disorganized  Descriptions of Associations:Loose  Orientation:Partial  Thought Content:Illogical; Rumination  History of Schizophrenia/Schizoaffective disorder:No data recorded Duration of Psychotic Symptoms:No data recorded Hallucinations:No data recorded Ideas of Reference:Paranoia  Suicidal Thoughts:No data recorded Homicidal Thoughts:No data recorded  Sensorium  Memory: Immediate Fair; Remote Poor  Judgment: Poor  Insight: Poor   Executive Functions  Concentration: Poor  Attention Span: Fair  Recall: [employer] of Knowledge: Fair  Language: Fair   Psychomotor Activity  Psychomotor Activity:No data recorded  Assets  Assets: Communication Skills   Sleep  Sleep:No data recorded    Blood pressure (!) 100/47, pulse 67, temperature 98.1 F (36.7 C), resp. rate 20, height 5\' 6"  (1.676 m), weight 64 kg, SpO2 100 %.  Body mass index is 22.76 kg/m.   Treatment Plan Summary: Daily contact with patient to assess and evaluate symptoms and progress in treatment, Medication management, and Plan continue current medications.  Kervin Bones Tresea Mall, DO 09/30/2022, 1:49 PM

## 2022-09-30 NOTE — Plan of Care (Signed)
Pt is irritable. Loud, singing and talking to herself. Endorses racing thoughts. C/o arm pain. Did not participant in group. Ibuprofen 600 mg po and benadryl 50 mg po given as ordered PRN, respectively. Q 15 min checks maintained for safety. Denies SI/HI/AVH. Plan of care continued.   Problem: Nutrition: Goal: Adequate nutrition will be maintained Outcome: Progressing   Problem: Coping: Goal: Level of anxiety will decrease Outcome: Progressing   Problem: Elimination: Goal: Will not experience complications related to bowel motility Outcome: Progressing Goal: Will not experience complications related to urinary retention Outcome: Progressing   Problem: Pain Managment: Goal: General experience of comfort will improve Outcome: Progressing   Problem: Safety: Goal: Ability to remain free from injury will improve Outcome: Progressing

## 2022-09-30 NOTE — Group Note (Signed)
Date:  09/30/2022 Time:  11:18 AM  Group Topic/Focus:  Wellness Toolbox:   The focus of this group is to discuss various aspects of wellness, balancing those aspects and exploring ways to increase the ability to experience wellness.  Patients will create a wellness toolbox for use upon discharge.    Participation Level:  Minimal  Participation Quality:  Appropriate  Affect:  Appropriate  Cognitive:  Appropriate  Insight: Appropriate  Engagement in Group:  Improving and Limited  Modes of Intervention:  Activity  Additional Comments:    [name] 09/30/2022, 11:18 AM
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
## 2022-10-01 NOTE — Progress Notes (Signed)
   10/01/22 0711  Psych Admission Type (Psych Patients Only)  Admission Status Voluntary  Psychosocial Assessment  Patient Complaints None  Eye Contact Poor  Facial Expression Animated  Affect Labile  Speech Loud;Aggressive;Slurred  Interaction Assertive  Motor Activity Unsteady  Appearance/Hygiene Layered clothes  Behavior Characteristics Restless  Mood Labile  Thought Process  Coherency Disorganized  Content Blaming others  Delusions None reported or observed  Perception Hallucinations  Hallucination Auditory;Visual  Judgment Limited  Confusion Mild  Danger to Self  Current suicidal ideation? Denies  Danger to Others  Danger to Others None reported or observed  Danger to Others Abnormal  Harmful Behavior to others No threats or harm toward other people

## 2022-10-01 NOTE — Group Note (Signed)
Recreation Therapy Group Note   Group Topic:Goal Setting  Group Date: 10/01/2022 Start Time: 1400 End Time: 1450 Facilitators: Rosina Lowenstein, LRT, CTRS Location:  Day Room  Group Description: Vision Board. Patients were given many different magazines, a glue stick, markers, and a piece of cardstock paper. LRT and pts discussed the importance of having goals in life. LRT and pts discussed the difference between short-term and long-term goals, as well as what a SMART goal is. LRT encouraged pts to create a vision board, with images they picked and then cut out with safety scissors from the magazine, for themselves, that capture their short and long-term goals. LRT encouraged pts to show and explain their vision board to the group. LRT offered to laminate vision board once dry and complete.   Goal Area(s) Addressed:  Patient will gain knowledge of short vs. long term goals.  Patient will identify goals for themselves. Patient will practice setting SMART goals. Patient will verbalize their goals to LRT and peers.  Affect/Mood: Appropriate   Participation Level: Active and Engaged   Participation Quality: Independent   Behavior: Calm and Cooperative   Speech/Thought Process: Coherent   Insight: Good   Judgement: Good   Modes of Intervention: Art and Activity   Patient Response to Interventions:  Attentive, Engaged, and Receptive   Education Outcome:  Acknowledges education   Clinical Observations/Individualized Feedback: Betty Henderson was active in their participation of session activities and group discussion. Pt identified "be on the beach somewhere, get out of here, and eat healthy food" as her goals. Pt appropriately cut out images to reflect these goals. Pt interacted well with LRT and peers duration of session.   Plan: Continue to engage patient in RT group sessions 2-3x/week.   Rosina Lowenstein, LRT, CTRS 10/01/2022 3:35 PM

## 2022-10-01 NOTE — Group Note (Signed)
Date:  10/01/2022 Time:  12:02 PM  Group Topic/Focus:  Music Therapy/Outside Rec     Participation Level:  Active  Participation Quality:  Appropriate  Affect:  Appropriate  Cognitive:  Disorganized  Insight: Appropriate  Engagement in Group:  Improving and Off Topic  Modes of Intervention:  Activity and Discussion  Additional Comments:    [name] 10/01/2022, 12:02 PM

## 2022-10-01 NOTE — Group Note (Signed)
Date:  10/01/2022 Time:  8:39 PM  Group Topic/Focus:  Rediscovering Joy:   The focus of this group is to explore various ways to relieve stress in a positive manner.    Participation Level:  Active  Participation Quality:  Appropriate  Affect:  Appropriate  Cognitive:  Appropriate  Insight: Appropriate  Engagement in Group:  Engaged  Modes of Intervention:  Exploration  Additional Comments:    [name] 10/01/2022, 8:39 PM

## 2022-10-01 NOTE — Group Note (Signed)
Date:  10/01/2022 Time:  5:34 AM  Group Topic/Focus:  Conflict Resolution:   The focus of this group is to discuss the conflict resolution process and how it may be used upon discharge.    Participation Level:  Did Not Attend  Participation Quality:   Did Not Attend  Affect:   Did Not Attend  Cognitive:   Did Not Attend  Insight: None  Engagement in Group:  None  Modes of Intervention:   Did Not Attend  Additional Comments:    [name] 10/01/2022, 5:34 AM

## 2022-10-01 NOTE — Progress Notes (Signed)
Patient is alert & oriented x 3.  She was pleasant, cooperative, and talkative with this Clinical research associate.  She was reoriented to situation.  She stated that she is ready to go home, but this Clinical research associate explained that she would need some assistance with the discharge process.  No s/sx. of acute distress.  Her Hinda Glatter was given without difficulty.  No adverse reactions reported or observed.  Although she denied SI, HI, delusions, and hallucinations - this Clinical research associate observed her talking to herself in her room.  She rested in bed with closed eyes - respirations even & unlabored.  Q17m safety checks & plan of care is in place.

## 2022-10-02 NOTE — Plan of Care (Signed)
Pt remains loud, irritable and agitated. Disheveled and unkempt. Dressed in layers. RIS. Disruptive in the dayroom during group. Verbal de-escalation provide. Placed in the quite room for decrease stimulation. Po med compliant. Q 15 min checks maintained for safety. Denies SI/HI/AV/H. No c/o pain/discomfort noted.  Problem: Nutrition: Goal: Adequate nutrition will be maintained Outcome: Progressing   Problem: Coping: Goal: Level of anxiety will decrease Outcome: Progressing   Problem: Elimination: Goal: Will not experience complications related to bowel motility Outcome: Progressing Goal: Will not experience complications related to urinary retention Outcome: Progressing   Problem: Pain Managment: Goal: General experience of comfort will improve Outcome: Progressing   Problem: Safety: Goal: Ability to remain free from injury will improve Outcome: Progressing   Problem: Skin Integrity: Goal: Risk for impaired skin integrity will decrease Outcome: Progressing
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
## 2022-10-02 NOTE — Progress Notes (Signed)
   10/02/22 0722  Psych Admission Type (Psych Patients Only)  Admission Status Voluntary  Psychosocial Assessment  Patient Complaints None  Eye Contact Brief  Facial Expression Blank  Affect Irritable;Labile  Speech Argumentative;Loud  Interaction Assertive  Motor Activity Slow  Appearance/Hygiene Layered clothes  Behavior Characteristics Irritable  Mood Labile  Thought Process  Coherency WDL  Content WDL  Delusions None reported or observed  Perception Hallucinations  Hallucination Auditory;Visual  Judgment Impaired  Confusion Mild

## 2022-10-02 NOTE — BH IP Treatment Plan (Addendum)
Interdisciplinary Treatment and Diagnostic Plan Update  10/02/2022 Time of Session: 9:00AM Betty Henderson MRN: [id number]  Principal Diagnosis: Schizoaffective disorder, bipolar type [facility])  Secondary Diagnoses: Principal Problem:   Schizoaffective disorder, bipolar type (HCC)   Current Medications:  Current Facility-Administered Medications  Medication Dose Route Frequency Provider Last Rate Last Admin   acetaminophen (TYLENOL) tablet 650 mg  650 mg Oral Q6H PRN [name], DO   650 mg at 10/02/22 0646   alum & mag hydroxide-simeth (MAALOX/MYLANTA) 200-200-20 MG/5ML suspension 30 mL  30 mL Oral Q4H PRN [name], DO   30 mL at 09/20/22 1142   diphenhydrAMINE (BENADRYL) capsule 50 mg  50 mg Oral Q6H PRN [name], DO   50 mg at 09/29/22 2254   Or   diphenhydrAMINE (BENADRYL) injection 50 mg  50 mg Intramuscular Q6H PRN [name], DO       haloperidol (HALDOL) tablet 5 mg  5 mg Oral Q6H PRN [name], DO   5 mg at 09/28/22 2149   Or   haloperidol lactate (HALDOL) injection 5 mg  5 mg Intramuscular Q6H PRN [name], DO       ibuprofen (ADVIL) tablet 600 mg  600 mg Oral Q6H PRN [name], [name], MD   600 mg at 09/29/22 2254   LORazepam (ATIVAN) tablet 1 mg  1 mg Oral TID PRN [name], DO   1 mg at 09/27/22 0025   magnesium hydroxide (MILK OF MAGNESIA) suspension 30 mL  30 mL Oral Daily PRN [name], DO       paliperidone (INVEGA) 24 hr tablet 6 mg  6 mg Oral QHS [name], DO   6 mg at 10/01/22 2200   traZODone (DESYREL) tablet 50 mg  50 mg Oral QHS PRN [name], DO   50 mg at 10/01/22 2200   PTA Medications: Medications Prior to Admission  Medication Sig Dispense Refill Last Dose   ondansetron (ZOFRAN) 4 MG tablet Take 1 tablet (4 mg total) by mouth every 8 (eight) hours as needed for nausea or vomiting. (Patient not taking: Reported on  08/17/2022) 20 tablet 0     Patient Stressors: Financial difficulties   Health problems   Medication change or noncompliance    Patient Strengths: Ability for insight   Treatment Modalities: Medication Management, Group therapy, Case management,  1 to 1 session with clinician, Psychoeducation, Recreational therapy.   Physician Treatment Plan for Primary Diagnosis: Schizoaffective disorder, bipolar type (HCC) Long Term Goal(s): Improvement in symptoms so as ready for discharge   Short Term Goals: Ability to identify changes in lifestyle to reduce recurrence of condition will improve Ability to verbalize feelings will improve Ability to disclose and discuss suicidal ideas Ability to demonstrate self-control will improve Ability to identify and develop effective coping behaviors will improve Ability to maintain clinical measurements within normal limits will improve Compliance with prescribed medications will improve Ability to identify triggers associated with substance abuse/mental health issues will improve  Medication Management: Evaluate patient's response, side effects, and tolerance of medication regimen.  Therapeutic Interventions: 1 to 1 sessions, Unit Group sessions and Medication administration.  Evaluation of Outcomes: Progressing  Physician Treatment Plan for Secondary Diagnosis: Principal Problem:   Schizoaffective disorder, bipolar type (HCC)  Long Term Goal(s): Improvement in symptoms so as ready for discharge   Short Term Goals: Ability to identify changes in lifestyle to reduce recurrence of condition will improve Ability to  verbalize feelings will improve Ability to disclose and discuss suicidal ideas Ability to demonstrate self-control will improve Ability to identify and develop effective coping behaviors will improve Ability to maintain clinical measurements within normal limits will improve Compliance with prescribed medications will improve Ability to  identify triggers associated with substance abuse/mental health issues will improve     Medication Management: Evaluate patient's response, side effects, and tolerance of medication regimen.  Therapeutic Interventions: 1 to 1 sessions, Unit Group sessions and Medication administration.  Evaluation of Outcomes: Progressing   RN Treatment Plan for Primary Diagnosis: Schizoaffective disorder, bipolar type (HCC) Long Term Goal(s): Knowledge of disease and therapeutic regimen to maintain health will improve  Short Term Goals: Ability to demonstrate self-control, Ability to participate in decision making will improve, Ability to verbalize feelings will improve, Ability to disclose and discuss suicidal ideas, Ability to identify and develop effective coping behaviors will improve, and Compliance with prescribed medications will improve  Medication Management: RN will administer medications as ordered by provider, will assess and evaluate patient's response and provide education to patient for prescribed medication. RN will report any adverse and/or side effects to prescribing provider.  Therapeutic Interventions: 1 on 1 counseling sessions, Psychoeducation, Medication administration, Evaluate responses to treatment, Monitor vital signs and CBGs as ordered, Perform/monitor CIWA, COWS, AIMS and Fall Risk screenings as ordered, Perform wound care treatments as ordered.  Evaluation of Outcomes: Progressing   LCSW Treatment Plan for Primary Diagnosis: Schizoaffective disorder, bipolar type (HCC) Long Term Goal(s): Safe transition to appropriate next level of care at discharge, Engage patient in therapeutic group addressing interpersonal concerns.  Short Term Goals: Engage patient in aftercare planning with referrals and resources, Increase social support, Increase ability to appropriately verbalize feelings, Increase emotional regulation, Facilitate acceptance of mental health diagnosis and concerns, and  Increase skills for wellness and recovery  Therapeutic Interventions: Assess for all discharge needs, 1 to 1 time with Social worker, Explore available resources and support systems, Assess for adequacy in community support network, Educate family and significant other(s) on suicide prevention, Complete Psychosocial Assessment, Interpersonal group therapy.  Evaluation of Outcomes: Progressing   Progress in Treatment: Attending groups: Yes. Participating in groups: No. Taking medication as prescribed: Yes. Toleration medication: Yes. Family/Significant other contact made: Yes, individual(s) contacted:  SPE completed with the patient's son. Patient understands diagnosis: No. Discussing patient identified problems/goals with staff: Yes. Medical problems stabilized or resolved: Yes. Denies suicidal/homicidal ideation: Yes. Issues/concerns per patient self-inventory: No. Other: none  New problem(s) identified: No, Describe:  none Update 09/27/2022:  No changes at this time.  Update 10/02/2022:  No changes at this time.    New Short Term/Long Term Goal(s): Update 6/30: none at this time. Update 09/27/2022:  No changes at this time.  Update 10/02/2022:  No changes at this time.      Patient Goals:  Update 6/30: none at this time. Update 09/27/2022:  No changes at this time. Update 10/02/2022:  No changes at this time.     Discharge Plan or Barriers: Update 6/30: APS report has been made and patient is being investigated for guardianship needs.  Remains homeless with limited supports. Update 09/27/2022:  No changes at this time.  Update 10/02/2022:  Patient remains safe on the unit at this time.  Patient remains psychotic at this time.  APS report has been made, however, no follow up from the caseworker on her case.  No safe discharge identified.  CSW has requested that application for Medicaid and disability  be completed.       Reason for Continuation of Hospitalization: Hallucinations Mania Medication  stabilization   Estimated Length of Stay: Update 6/30: 1-7 days Update 09/27/2022:  TBD Update 10/02/2022:  No changes at this time.     Last 3 [place] Suicide Severity Risk Score: Flowsheet Row Admission (Current) from 08/22/2022 in [facility] from 08/17/2022 in [facility] Department at [facility] ED from 08/12/2022 in [facility] Emergency Department at [facility]  C-SSRS RISK CATEGORY Low Risk No Risk No Risk       Last [facility] 2/9 Scores:     No data to display          Scribe for Treatment Team: Harden Mo, Alexander Mt 10/02/2022 4:11 PM

## 2022-10-02 NOTE — Group Note (Signed)
Date:  10/02/2022 Time:  9:47 AM  Group Topic/Focus:  Making Healthy Choices:   The focus of this group is to help patients identify negative/unhealthy choices they were using prior to admission and identify positive/healthier coping strategies to replace them upon discharge.    Participation Level:  Did Not Attend   Betty Henderson 10/02/2022, 9:47 AM

## 2022-10-02 NOTE — Group Note (Signed)
Recreation Therapy Group Note   Group Topic:General Recreation  Group Date: 10/02/2022 Start Time: 1400 End Time: 1455 Facilitators: Rosina Lowenstein, LRT, CTRS Location:  Day Room  Group Description: Bingo. Patients had the choice between exercise and Bingo. LRT and patients played multiple rounds of bingo with music playing in the background. Pts had the option of a stress ball, activity book, adult coloring book or composition book as a prize. LRT an pts discussed how this could be a leisure interest post-discharge.   Goal Area(s) Addressed: Patient will practice healthy decision making. Patient will engage in recreation activity. Patient will practice healthy decision making.   Affect/Mood: N/A   Participation Level: Did not attend    Clinical Observations/Individualized Feedback: Betty Henderson was present in the day room for about 5 minutes before leaving. While she was in the dayroom, she was taking to herself.  Plan: Continue to engage patient in RT group sessions 2-3x/week.   Rosina Lowenstein, LRT, CTRS 10/02/2022 3:35 PM

## 2022-10-02 NOTE — BHH Counselor (Signed)
CSW called [facility].  No available beds.  Penni Homans, MSW, LCSW 10/02/2022 12:58 PM

## 2022-10-02 NOTE — Group Note (Signed)
Date:  10/02/2022 Time:  8:48 PM  Group Topic/Focus:  Managing Feelings:   The focus of this group is to identify what feelings patients have difficulty handling and develop a plan to handle them in a healthier way upon discharge. Self Care:   The focus of this group is to help patients understand the importance of self-care in order to improve or restore emotional, physical, spiritual, interpersonal, and financial health.    Participation Level:  Did Not Attend  Participation Quality:    Affect:    Cognitive:    Insight:   Engagement in Group:    Modes of Intervention:    Additional Comments:  Pt did not participate after being encouraged  Jaquita Rector 10/02/2022, 8:48 PM

## 2022-10-03 DIAGNOSIS — F250 Schizoaffective disorder, bipolar type: Secondary | ICD-10-CM | POA: Diagnosis not present

## 2022-10-03 NOTE — Group Note (Signed)
Recreation Therapy Group Note   Group Topic:Healthy Support Systems  Group Date: 10/03/2022 Start Time: 1400 End Time: 1445 Facilitators: Rosina Lowenstein, LRT, CTRS Location: Courtyard  Group Description: Emotional Check in. Patient sat and talked with LRT about how they are doing and whatever else is on their mind. LRT provided active listening, reassurance and encouragement. Group was held outside in the courtyard to get fresh air and sunlight. Music was playing in the background.    Goal Area(s) Addressed: Patient will engage in conversation with LRT. Patient will communicate their wants, needs, or questions.  Patient will practice a new coping skill of "talking to someone".   Affect/Mood: Appropriate   Participation Level: Active and Engaged   Participation Quality: Independent   Behavior: Appropriate, Calm, and Cooperative   Speech/Thought Process: Coherent   Insight: Good and Improved   Judgement: Good and Improved   Modes of Intervention: Guided Discussion   Patient Response to Interventions:  Attentive, Engaged, Interested , and Receptive   Education Outcome:  Acknowledges education   Clinical Observations/Individualized Feedback: Betty Henderson was active in their participation of session activities and group discussion. Pt identified "I like gospel music". Pt also shared that she is looking forward to leaving the hospital. Pt did not talk to herself or respond to internal stimuli while in group, however pt was not outside for the while time. Pt went inside 10 minutes early.    Plan: Continue to engage patient in RT group sessions 2-3x/week.   Rosina Lowenstein, LRT, CTRS 10/03/2022 3:33 PM

## 2022-10-03 NOTE — Progress Notes (Signed)
   10/03/22 2200  Psych Admission Type (Psych Patients Only)  Admission Status Voluntary  Psychosocial Assessment  Patient Complaints None  Eye Contact Brief  Facial Expression Flat  Affect Labile  Speech Rapid  Interaction Minimal  Motor Activity Slow  Appearance/Hygiene Layered clothes  Behavior Characteristics Restless  Mood Labile  Thought Process  Coherency Flight of ideas;Disorganized  Content Preoccupation  Delusions None reported or observed  Perception Hallucinations  Hallucination Auditory  Judgment Impaired  Confusion Mild  Danger to Self  Current suicidal ideation? Denies  Agreement Not to Harm Self Yes  Description of Agreement verbal  Danger to Others  Danger to Others None reported or observed  Danger to Others Abnormal  Harmful Behavior to others No threats or harm toward other people  Destructive Behavior No threats or harm toward property

## 2022-10-03 NOTE — Progress Notes (Signed)
Patient is alert & oriented x 3.  At times she is not clear why she is still in the hospital, and would like to leave. No s/sx. of acute distress observed or reported.  She is able to voice complaints & concerns.  She denied symptoms:  SI, HI, hallucinations, & delusions, but was heard throughout the shift in her room talking to herself.  Medications were taking without difficulty.  No AIMs observed.  She c/o pain 10/10 in both of his arms.  She was given Ibuprofen 600mg  as per order.  She rested in bed - respirations even & unlabored.  Q75m safety checks & place of care in place.

## 2022-10-03 NOTE — Progress Notes (Signed)
Patient pleasant and in the dayroom for breakfast. Isolated to room after eating.  Denies SI/HI.  No schedule medications this shift. 15 min checks in place for safety.  Minimal interaction with peers and staff.    Patient can be heard talking to herself in room, but has not been disruptive or needed PRNs.

## 2022-10-03 NOTE — Plan of Care (Signed)
  Problem: Education: Goal: Knowledge of General Education information will improve Description: Including pain rating scale, medication(s)/side effects and non-pharmacologic comfort measures Outcome: Progressing   Problem: Nutrition: Goal: Adequate nutrition will be maintained Outcome: Progressing   Problem: Coping: Goal: Level of anxiety will decrease Outcome: Progressing   

## 2022-10-03 NOTE — Group Note (Signed)
Date:  10/03/2022 Time:  9:12 PM  Group Topic/Focus:  Coping With Mental Health Crisis:   The purpose of this group is to help patients identify strategies for coping with mental health crisis.  Group discusses possible causes of crisis and ways to manage them effectively.    Participation Level:  Active  Participation Quality:  Appropriate  Affect:  Appropriate  Cognitive:  Disorganized  Insight: Improving  Engagement in Group:  Improving  Modes of Intervention:  Discussion  Additional Comments:  Adult Unit Workbook [id number]  Roberto Scales 10/03/2022, 9:12 PM

## 2022-10-03 NOTE — Progress Notes (Signed)
[facility] MD Progress Note  10/03/2022 6:42 PM Betty Henderson  MRN:  [id number] Subjective:  Patient seen today, chart reviewed, case discussed with staff.  Patient reports she is doing good.  Patient denies thoughts of harming herself or others.  Patient denies auditory or visual hallucination but continues to talk to herself in her room.  Patient is probably at her baseline and is awaiting placement.  Principal Problem: Schizoaffective disorder, bipolar type (HCC) Diagnosis: Principal Problem:   Schizoaffective disorder, bipolar type (HCC)  Total Time spent with patient: 15 minutes  Past Psychiatric History: Schizophrenia versus schizoaffective disorder  Past Medical History:  Past Medical History:  Diagnosis Date   Anemia    Arthritis    Chronic pain    Drug-seeking behavior    Malingering    Osteopetrosis    Psychosis (HCC)    Schizoaffective disorder, bipolar type (HCC)     Past Surgical History:  Procedure Laterality Date   MOUTH SURGERY     TUBAL LIGATION     Family History:  Family History  Family history unknown: Yes   Family Psychiatric  History: Unremarkable Social History:  Social History   Substance and Sexual Activity  Alcohol Use Not Currently   Comment: 1 cocktail 3 weeks ago     Social History   Substance and Sexual Activity  Drug Use Not Currently   Types: Cocaine   Comment: states "it's legal though"    Social History   Socioeconomic History   Marital status: Single    Spouse name: Not on file   Number of children: Not on file   Years of education: Not on file   Highest education level: Not on file  Occupational History   Not on file  Tobacco Use   Smoking status: Every Day    Current packs/day: 0.50    Types: Cigarettes   Smokeless tobacco: Never  Vaping Use   Vaping status: Never Used  Substance and Sexual Activity   Alcohol use: Not Currently    Comment: 1 cocktail 3 weeks ago   Drug use: Not Currently    Types: Cocaine     Comment: states "it's legal though"   Sexual activity: Never  Other Topics Concern   Not on file  Social History Narrative   Not on file   Social Determinants of Health   Financial Resource Strain: Not on file  Food Insecurity: Food Insecurity Present (08/22/2022)   Hunger Vital Sign    Worried About Running Out of Food in the Last Year: Sometimes true    Ran Out of Food in the Last Year: Sometimes true  Transportation Needs: Patient Unable To Answer (08/22/2022)   PRAPARE - Transportation    Lack of Transportation (Medical): Patient unable to answer    Lack of Transportation (Non-Medical): Patient unable to answer  Recent Concern: Transportation Needs - Unmet Transportation Needs (06/13/2022)   Received from [facility]   [facility] - Transportation    Lack of Transportation (Medical): Not on file    Lack of Transportation (Non-Medical): Yes  Physical Activity: Not on file  Stress: Not on file  Social Connections: Unknown (08/06/2021)   Received from [facility], [facility]   Social Network    Social Network: Not on file   Additional Social History:                         Sleep: Good  Appetite:  Good  Current  Medications: Current Facility-Administered Medications  Medication Dose Route Frequency Provider Last Rate Last Admin   acetaminophen (TYLENOL) tablet 650 mg  650 mg Oral Q6H PRN [name], DO   650 mg at 10/02/22 0646   alum & mag hydroxide-simeth (MAALOX/MYLANTA) 200-200-20 MG/5ML suspension 30 mL  30 mL Oral Q4H PRN [name], DO   30 mL at 09/20/22 1142   diphenhydrAMINE (BENADRYL) capsule 50 mg  50 mg Oral Q6H PRN [name], DO   50 mg at 09/29/22 2254   Or   diphenhydrAMINE (BENADRYL) injection 50 mg  50 mg Intramuscular Q6H PRN [name], DO       haloperidol (HALDOL) tablet 5 mg  5 mg Oral Q6H PRN [name], DO   5 mg at 09/28/22 2149   Or   haloperidol lactate  (HALDOL) injection 5 mg  5 mg Intramuscular Q6H PRN [name], DO       ibuprofen (ADVIL) tablet 600 mg  600 mg Oral Q6H PRN [name], [name], MD   600 mg at 10/03/22 0513   LORazepam (ATIVAN) tablet 1 mg  1 mg Oral TID PRN [name], DO   1 mg at 09/27/22 0025   magnesium hydroxide (MILK OF MAGNESIA) suspension 30 mL  30 mL Oral Daily PRN [name], DO       paliperidone (INVEGA) 24 hr tablet 6 mg  6 mg Oral QHS [name], DO   6 mg at 10/02/22 2137   traZODone (DESYREL) tablet 50 mg  50 mg Oral QHS PRN [name], DO   50 mg at 10/01/22 2200    Lab Results: No results found for this or any previous visit (from the past 48 hour(s)).  Blood Alcohol level:  Lab Results  Component Value Date   ETH <10 08/17/2022   ETH <10 01/11/2021    Metabolic Disorder Labs: No results found for: "HGBA1C", "MPG" No results found for: "PROLACTIN" No results found for: "CHOL", "TRIG", "HDL", "CHOLHDL", "VLDL", "LDLCALC"   Musculoskeletal: Strength & Muscle Tone: within normal limits Gait & Station: normal Patient leans: N/A  Psychiatric Specialty Exam:  Presentation  General Appearance:  Casual; Neat  Eye Contact: Fair  Speech: Spontaneous, Garbled  Speech Volume: Normal  Handedness: Right   Mood and Affect  Mood: Good  Affect: Anxious   Thought Process  Thought Processes: Disorganized  Descriptions of Associations:Loose  Orientation:Partial  Thought Content:Illogical; Rumination  Hallucinations: Patient denies auditory hallucination, is observed talking to self in her room, is observed responding to internal stimuli  Ideas of Reference:Paranoia  Suicidal Thoughts:Denies Homicidal Thoughts:Denies  Sensorium  Memory: Immediate Fair; Remote Poor  Judgment: Poor  Insight: Poor   Executive Functions  Concentration: Poor  Attention Span: Fair    Psychomotor Activity  Psychomotor  Activity: Hyperactive Assets  Assets: Communication Skills    Blood pressure 122/60, pulse 81, temperature 97.9 F (36.6 C), resp. rate 18, height 5\' 6"  (1.676 m), weight 64 kg, SpO2 100%. Body mass index is 22.76 kg/m.   Treatment Plan Summary: Daily contact with patient to assess and evaluate symptoms and progress in treatment, Medication management, and Plan continue current medications.  [name], MD

## 2022-10-04 DIAGNOSIS — F250 Schizoaffective disorder, bipolar type: Secondary | ICD-10-CM | POA: Diagnosis not present

## 2022-10-04 NOTE — Progress Notes (Signed)
[facility] MD Progress Note  10/04/2022 9:27 PM Betty Henderson  MRN:  [id number] Subjective:  Patient seen today, chart reviewed, case discussed with staff.  Continue to admit events reported.  Patient reports she is doing good.  Patient denies thoughts of harming herself or others.  Patient denies auditory or visual hallucination but continues to talk to herself in her room, probably at baseline.  Patient is awaiting placement.  Principal Problem: Schizoaffective disorder, bipolar type (HCC) Diagnosis: Principal Problem:   Schizoaffective disorder, bipolar type (HCC)   Past Psychiatric History: Schizophrenia versus schizoaffective disorder  Past Medical History:  Past Medical History:  Diagnosis Date   Anemia    Arthritis    Chronic pain    Drug-seeking behavior    Malingering    Osteopetrosis    Psychosis (HCC)    Schizoaffective disorder, bipolar type (HCC)     Past Surgical History:  Procedure Laterality Date   MOUTH SURGERY     TUBAL LIGATION     Family History:  Family History  Family history unknown: Yes   Family Psychiatric  History: Unremarkable Social History:  Social History   Substance and Sexual Activity  Alcohol Use Not Currently   Comment: 1 cocktail 3 weeks ago     Social History   Substance and Sexual Activity  Drug Use Not Currently   Types: Cocaine   Comment: states "it's legal though"    Social History   Socioeconomic History   Marital status: Single    Spouse name: Not on file   Number of children: Not on file   Years of education: Not on file   Highest education level: Not on file  Occupational History   Not on file  Tobacco Use   Smoking status: Every Day    Current packs/day: 0.50    Types: Cigarettes   Smokeless tobacco: Never  Vaping Use   Vaping status: Never Used  Substance and Sexual Activity   Alcohol use: Not Currently    Comment: 1 cocktail 3 weeks ago   Drug use: Not Currently    Types: Cocaine    Comment: states  "it's legal though"   Sexual activity: Never  Other Topics Concern   Not on file  Social History Narrative   Not on file   Social Determinants of Health   Financial Resource Strain: Not on file  Food Insecurity: Food Insecurity Present (08/22/2022)   Hunger Vital Sign    Worried About Running Out of Food in the Last Year: Sometimes true    Ran Out of Food in the Last Year: Sometimes true  Transportation Needs: Patient Unable To Answer (08/22/2022)   PRAPARE - Transportation    Lack of Transportation (Medical): Patient unable to answer    Lack of Transportation (Non-Medical): Patient unable to answer  Recent Concern: Transportation Needs - Unmet Transportation Needs (06/13/2022)   Received from [facility], [facility]   [facility] - Transportation    Lack of Transportation (Medical): Not on file    Lack of Transportation (Non-Medical): Yes  Physical Activity: Not on file  Stress: Not on file  Social Connections: Unknown (08/06/2021)   Received from [facility], [facility]   Social Network    Social Network: Not on file   Additional Social History:                         Sleep: Good  Appetite:  Good  Current Medications: Current Facility-Administered Medications  Medication Dose Route Frequency Provider Last Rate Last Admin   acetaminophen (TYLENOL) tablet 650 mg  650 mg Oral Q6H PRN [name], DO   650 mg at 10/02/22 0646   alum & mag hydroxide-simeth (MAALOX/MYLANTA) 200-200-20 MG/5ML suspension 30 mL  30 mL Oral Q4H PRN [name], DO   30 mL at 09/20/22 1142   diphenhydrAMINE (BENADRYL) capsule 50 mg  50 mg Oral Q6H PRN [name], DO   50 mg at 09/29/22 2254   Or   diphenhydrAMINE (BENADRYL) injection 50 mg  50 mg Intramuscular Q6H PRN [name], DO       haloperidol (HALDOL) tablet 5 mg  5 mg Oral Q6H PRN [name], DO   5 mg at 09/28/22 2149   Or   haloperidol lactate (HALDOL)  injection 5 mg  5 mg Intramuscular Q6H PRN [name], DO       ibuprofen (ADVIL) tablet 600 mg  600 mg Oral Q6H PRN [name], [name], MD   600 mg at 10/03/22 0513   LORazepam (ATIVAN) tablet 1 mg  1 mg Oral TID PRN [name], DO   1 mg at 09/27/22 0025   magnesium hydroxide (MILK OF MAGNESIA) suspension 30 mL  30 mL Oral Daily PRN [name], DO       paliperidone (INVEGA) 24 hr tablet 6 mg  6 mg Oral QHS [name], DO   6 mg at 10/04/22 2126   traZODone (DESYREL) tablet 50 mg  50 mg Oral QHS PRN [name], DO   50 mg at 10/04/22 2126    Lab Results: No results found for this or any previous visit (from the past 48 hour(s)).  Blood Alcohol level:  Lab Results  Component Value Date   ETH <10 08/17/2022   ETH <10 01/11/2021    Metabolic Disorder Labs: No results found for: "HGBA1C", "MPG" No results found for: "PROLACTIN" No results found for: "CHOL", "TRIG", "HDL", "CHOLHDL", "VLDL", "LDLCALC"   Musculoskeletal: Strength & Muscle Tone: within normal limits Gait & Station: normal Patient leans: N/A  Psychiatric Specialty Exam:  Presentation  General Appearance:  Casual; Neat  Eye Contact: Fair  Speech: Spontaneous, Garbled  Speech Volume: Normal  Handedness: Right   Mood and Affect  Mood: Good  Affect: Anxious   Thought Process  Thought Processes: Disorganized  Descriptions of Associations:Loose  Orientation:Partial  Thought Content:Illogical; Rumination  Hallucinations: Patient denies auditory hallucination, is observed talking to self in her room, is observed responding to internal stimuli  Ideas of Reference:Paranoia  Suicidal Thoughts:Denies Homicidal Thoughts:Denies  Sensorium  Memory: Immediate Fair; Remote Poor  Judgment: Poor  Insight: Poor   Executive Functions  Concentration: Poor  Attention Span: Fair    Psychomotor Activity  Psychomotor Activity:  Hyperactive Assets  Assets: Communication Skills    Blood pressure 118/67, pulse 84, temperature 98.4 F (36.9 C), resp. rate 18, height 5\' 6"  (1.676 m), weight 64 kg, SpO2 99%. Body mass index is 22.76 kg/m.   Treatment Plan Summary: Daily contact with patient to assess and evaluate symptoms and progress in treatment, Medication management, and Plan continue current medications.  [name], MD

## 2022-10-04 NOTE — Progress Notes (Signed)
Patient present in the dayroom for breakfast.  Reports she slept well.  Good appetite.  Denies SI/HI.  Patient has isolated to room other than meals.  No schedule medications this shift.  15 min checks in place for safety.   Minimal interaction with staff and peers.

## 2022-10-04 NOTE — Group Note (Signed)
Date:  10/04/2022 Time:  12:11 PM  Group Topic/Focus:  Rediscovering Joy:   The focus of this group is to explore various ways to relieve stress in a positive manner.    Participation Level:  Minimal  Participation Quality:  Appropriate and Redirectable  Affect:  Appropriate  Cognitive:  Appropriate and Confused  Insight: Good and Improving  Engagement in Group:  Improving and Limited  Modes of Intervention:  Activity and Discussion  Additional Comments:    [name] 10/04/2022, 12:11 PM

## 2022-10-04 NOTE — Group Note (Signed)
Recreation Therapy Group Note   Group Topic:Health and Wellness  Group Date: 10/04/2022 Start Time: 1400 End Time: 1445 Facilitators: Rosina Lowenstein, LRT, CTRS Location:  Day Room  Group Description: Seated Exercise. LRT discussed the mental and physical benefits of exercise. LRT and group discussed how physical activity can be used as a coping skill. Pt's and LRT followed along to an exercise video on the TV screen that provided a visual representation and audio description of every exercise performed. Pt's encouraged to listen to their bodies and stop at any time if they experience feelings of discomfort or pain. LRT passed out water after session was over and encouraged pts do drink and stay hydrated.  Goal Area(s) Addressed: Patient will learn benefits of physical activity. Patient will identify exercise as a coping skill.  Patient will follow multistep directions. Patient will try a new leisure interest.   Affect/Mood: N/A   Participation Level: Did not attend    Clinical Observations/Individualized Feedback: Fareeha did not attend group.  Plan: Continue to engage patient in RT group sessions 2-3x/week.   Rosina Lowenstein, LRT, CTRS 10/04/2022 3:13 PM

## 2022-10-04 NOTE — Progress Notes (Signed)
   10/04/22 2300  Psych Admission Type (Psych Patients Only)  Admission Status Voluntary  Psychosocial Assessment  Patient Complaints None  Eye Contact Brief  Facial Expression Flat  Affect Labile  Speech Pressured  Interaction Minimal  Motor Activity Slow  Appearance/Hygiene Layered clothes  Behavior Characteristics Restless  Mood Pleasant  Thought Process  Coherency Flight of ideas;Disorganized  Content Preoccupation  Delusions None reported or observed  Perception Hallucinations  Hallucination Auditory  Judgment Impaired  Confusion Mild  Danger to Self  Current suicidal ideation? Denies  Agreement Not to Harm Self Yes  Description of Agreement verbal  Danger to Others  Danger to Others None reported or observed  Danger to Others Abnormal  Harmful Behavior to others No threats or harm toward other people  Destructive Behavior No threats or harm toward property

## 2022-10-04 NOTE — Plan of Care (Signed)

## 2022-10-05 DIAGNOSIS — F250 Schizoaffective disorder, bipolar type: Secondary | ICD-10-CM | POA: Diagnosis not present

## 2022-10-05 NOTE — Group Note (Signed)
Date:  10/05/2022 Time:  8:18 PM  Group Topic/Focus:  Conflict Resolution:   The focus of this group is to discuss the conflict resolution process and how it may be used upon discharge.    Participation Level:  Active  Participation Quality:  Appropriate  Affect:  Appropriate  Cognitive:  Appropriate  Insight: Appropriate  Engagement in Group:  Engaged  Modes of Intervention:  Education  Additional Comments:    Jeannette How 10/05/2022, 8:18 PM

## 2022-10-05 NOTE — Group Note (Signed)
Date:  10/05/2022 Time:  3:03 PM  Group Topic/Focus:  Coping With Mental Health Crisis:   The purpose of this group is to help patients identify strategies for coping with mental health crisis.  Group discusses possible causes of crisis and ways to manage them effectively. Rediscovering Joy:   The focus of this group is to explore various ways to relieve stress in a positive manner.  We went outside for fresh air and relaxation!    Participation Level:  Active  Participation Quality:  Appropriate  Affect:  Appropriate  Cognitive:  Appropriate  Insight: Appropriate  Engagement in Group:  Engaged  Modes of Intervention:  Discussion  Additional Comments:     Cully Luckow L Damesha Lawler 10/05/2022, 3:03 PM

## 2022-10-05 NOTE — Plan of Care (Signed)
  Problem: Nutrition: Goal: Adequate nutrition will be maintained Outcome: Progressing   Problem: Pain Managment: Goal: General experience of comfort will improve Outcome: Progressing   Problem: Safety: Goal: Ability to remain free from injury will improve Outcome: Progressing   

## 2022-10-05 NOTE — Group Note (Signed)
Date:  10/05/2022 Time:  12:27 PM  Group Topic/Focus:  Coping With Mental Health Crisis:   The purpose of this group is to help patients identify strategies for coping with mental health crisis.  Group discusses possible causes of crisis and ways to manage them effectively.    Participation Level:  Active  Participation Quality:  Appropriate  Affect:  Appropriate  Cognitive:  Appropriate  Insight: Appropriate  Engagement in Group:  Engaged  Modes of Intervention:  Discussion  Additional Comments:     Kaylem Gidney L Toni Demo 10/05/2022, 12:27 PM

## 2022-10-05 NOTE — Progress Notes (Signed)
[facility] MD Progress Note  10/05/2022 2:57 PM Betty Henderson  MRN:  [id number] Subjective:  Patient seen today, chart reviewed, case discussed with staff.  Patient reports she is doing fine  Patient denies thoughts of harming herself or others.  Patient continues to talk to self although patient denies auditory or   Patient is awaiting placement.  Principal Problem: Schizoaffective disorder, bipolar type (HCC) Diagnosis: Principal Problem:   Schizoaffective disorder, bipolar type (HCC)   Past Psychiatric History: Schizophrenia versus schizoaffective disorder  Past Medical History:  Past Medical History:  Diagnosis Date   Anemia    Arthritis    Chronic pain    Drug-seeking behavior    Malingering    Osteopetrosis    Psychosis (HCC)    Schizoaffective disorder, bipolar type (HCC)     Past Surgical History:  Procedure Laterality Date   MOUTH SURGERY     TUBAL LIGATION     Family History:  Family History  Family history unknown: Yes   Family Psychiatric  History: Unremarkable Social History:  Social History   Substance and Sexual Activity  Alcohol Use Not Currently   Comment: 1 cocktail 3 weeks ago     Social History   Substance and Sexual Activity  Drug Use Not Currently   Types: Cocaine   Comment: states "it's legal though"    Social History   Socioeconomic History   Marital status: Single    Spouse name: Not on file   Number of children: Not on file   Years of education: Not on file   Highest education level: Not on file  Occupational History   Not on file  Tobacco Use   Smoking status: Every Day    Current packs/day: 0.50    Types: Cigarettes   Smokeless tobacco: Never  Vaping Use   Vaping status: Never Used  Substance and Sexual Activity   Alcohol use: Not Currently    Comment: 1 cocktail 3 weeks ago   Drug use: Not Currently    Types: Cocaine    Comment: states "it's legal though"   Sexual activity: Never  Other Topics Concern   Not on file   Social History Narrative   Not on file   Social Determinants of Health   Financial Resource Strain: Not on file  Food Insecurity: Food Insecurity Present (08/22/2022)   Hunger Vital Sign    Worried About Running Out of Food in the Last Year: Sometimes true    Ran Out of Food in the Last Year: Sometimes true  Transportation Needs: Patient Unable To Answer (08/22/2022)   PRAPARE - Transportation    Lack of Transportation (Medical): Patient unable to answer    Lack of Transportation (Non-Medical): Patient unable to answer  Recent Concern: Transportation Needs - Unmet Transportation Needs (06/13/2022)   Received from [facility], [facility]   [facility] - Transportation    Lack of Transportation (Medical): Not on file    Lack of Transportation (Non-Medical): Yes  Physical Activity: Not on file  Stress: Not on file  Social Connections: Unknown (08/06/2021)   Received from [facility], [facility]   Social Network    Social Network: Not on file   Additional Social History:                         Sleep: Good  Appetite:  Good  Current Medications: Current Facility-Administered Medications  Medication Dose Route Frequency Provider Last Rate Last Admin  acetaminophen (TYLENOL) tablet 650 mg  650 mg Oral Q6H PRN [name], DO   650 mg at 10/02/22 0646   alum & mag hydroxide-simeth (MAALOX/MYLANTA) 200-200-20 MG/5ML suspension 30 mL  30 mL Oral Q4H PRN [name], DO   30 mL at 09/20/22 1142   diphenhydrAMINE (BENADRYL) capsule 50 mg  50 mg Oral Q6H PRN [name], DO   50 mg at 09/29/22 2254   Or   diphenhydrAMINE (BENADRYL) injection 50 mg  50 mg Intramuscular Q6H PRN [name], DO       haloperidol (HALDOL) tablet 5 mg  5 mg Oral Q6H PRN [name], DO   5 mg at 09/28/22 2149   Or   haloperidol lactate (HALDOL) injection 5 mg  5 mg Intramuscular Q6H PRN [name], DO       ibuprofen  (ADVIL) tablet 600 mg  600 mg Oral Q6H PRN [name], [name], MD   600 mg at 10/03/22 0513   LORazepam (ATIVAN) tablet 1 mg  1 mg Oral TID PRN [name], DO   1 mg at 09/27/22 0025   magnesium hydroxide (MILK OF MAGNESIA) suspension 30 mL  30 mL Oral Daily PRN [name], DO       paliperidone (INVEGA) 24 hr tablet 6 mg  6 mg Oral QHS [name], DO   6 mg at 10/04/22 2126   traZODone (DESYREL) tablet 50 mg  50 mg Oral QHS PRN [name], DO   50 mg at 10/04/22 2126    Lab Results: No results found for this or any previous visit (from the past 48 hour(s)).  Blood Alcohol level:  Lab Results  Component Value Date   ETH <10 08/17/2022   ETH <10 01/11/2021    Metabolic Disorder Labs: No results found for: "HGBA1C", "MPG" No results found for: "PROLACTIN" No results found for: "CHOL", "TRIG", "HDL", "CHOLHDL", "VLDL", "LDLCALC"   Musculoskeletal: Strength & Muscle Tone: within normal limits Gait & Station: normal Patient leans: N/A  Psychiatric Specialty Exam:  Presentation  General Appearance:  Casual; Neat  Eye Contact: Fair  Speech: Spontaneous, Garbled  Speech Volume: Normal  Handedness: Right   Mood and Affect  Mood: Good  Affect: Anxious   Thought Process  Thought Processes: Disorganized  Descriptions of Associations:Loose  Orientation:Partial  Thought Content:Illogical; Rumination  Hallucinations: Patient denies auditory hallucination, is observed talking to self in her room, is observed responding to internal stimuli  Ideas of Reference:Paranoia  Suicidal Thoughts:Denies Homicidal Thoughts:Denies  Sensorium  Memory: Immediate Fair; Remote Poor  Judgment: Poor  Insight: Poor   Executive Functions  Concentration: Poor  Attention Span: Fair    Psychomotor Activity  Psychomotor Activity: Hyperactive Assets  Assets: Communication Skills    Blood pressure 125/77, pulse  68, temperature 98.1 F (36.7 C), resp. rate 16, height 5\' 6"  (1.676 m), weight 64 kg, SpO2 99%. Body mass index is 22.76 kg/m.   Treatment Plan Summary: Daily contact with patient to assess and evaluate symptoms and progress in treatment, Medication management, and Plan continue current medications.  [name], MD

## 2022-10-05 NOTE — Progress Notes (Signed)
   10/05/22 1400  Psych Admission Type (Psych Patients Only)  Admission Status Voluntary  Psychosocial Assessment  Patient Complaints None  Eye Contact Brief  Facial Expression Flat  Affect Labile  Speech Slurred  Interaction Minimal  Motor Activity Slow  Appearance/Hygiene Layered clothes  Behavior Characteristics Restless  Mood Pleasant  Thought Process  Coherency Disorganized  Content Preoccupation  Delusions None reported or observed  Perception Hallucinations  Hallucination Auditory;Visual  Judgment Impaired  Confusion Mild  Danger to Self  Current suicidal ideation? Denies  Agreement Not to Harm Self Yes  Description of Agreement verbal  Danger to Others  Danger to Others None reported or observed  Danger to Others Abnormal  Harmful Behavior to others No threats or harm toward other people  Destructive Behavior No threats or harm toward property

## 2022-10-05 NOTE — Progress Notes (Signed)
   10/05/22 0600  15 Minute Checks  Location Bedroom  Visual Appearance Calm  Behavior Sleeping  Sleep (Behavioral Health Patients Only)  Calculate sleep? (Click Yes once per 24 hr at 0600 safety check) Yes  Documented sleep last 24 hours 12.75

## 2022-10-06 DIAGNOSIS — F250 Schizoaffective disorder, bipolar type: Secondary | ICD-10-CM | POA: Diagnosis not present

## 2022-10-06 NOTE — Progress Notes (Signed)
   10/05/22 2300  Psych Admission Type (Psych Patients Only)  Admission Status Voluntary  Psychosocial Assessment  Patient Complaints None  Eye Contact Brief  Facial Expression Flat  Affect Labile  Speech Slurred;Rapid  Interaction Minimal  Motor Activity Slow  Appearance/Hygiene Layered clothes  Behavior Characteristics Restless  Mood Pleasant  Thought Process  Coherency Disorganized;Flight of ideas  Content Preoccupation  Delusions None reported or observed  Perception Hallucinations  Hallucination Auditory;Visual  Judgment Impaired  Confusion Mild  Danger to Self  Current suicidal ideation? Denies  Agreement Not to Harm Self Yes  Description of Agreement verbal  Danger to Others  Danger to Others None reported or observed  Danger to Others Abnormal  Harmful Behavior to others No threats or harm toward other people  Destructive Behavior No threats or harm toward property

## 2022-10-06 NOTE — Group Note (Signed)
Date:  10/06/2022 Time:  11:23 AM  Group Topic/Focus:  Coping With Mental Health Crisis:   The purpose of this group is to help patients identify strategies for coping with mental health crisis.  Group discusses possible causes of crisis and ways to manage them effectively. Healthy Communication:   The focus of this group is to discuss communication, barriers to communication, as well as healthy ways to communicate with others. Self Care:   The focus of this group is to help patients understand the importance of self-care in order to improve or restore emotional, physical, spiritual, interpersonal, and financial health.    Participation Level:  Active  Participation Quality:  Appropriate  Affect:  Appropriate  Cognitive:  Appropriate  Insight: Appropriate  Engagement in Group:  Engaged  Modes of Intervention:  Discussion  Additional Comments:     Addyson Traub L Adaleen Hulgan 10/06/2022, 11:23 AM

## 2022-10-06 NOTE — Progress Notes (Signed)
[facility] MD Progress Note  10/06/2022 12:09 PM Betty Henderson  MRN:  [id number] Subjective:  Patient seen today, chart reviewed, case discussed with staff.  No new events.  Issues reported by staff.  Patient reports she is doing fine  Patient denies thoughts of harming herself or others.  Patient continues to talk to self although patient denies auditory or visual hallucinations.   Patient is awaiting placement.  Principal Problem: Schizoaffective disorder, bipolar type (HCC) Diagnosis: Principal Problem:   Schizoaffective disorder, bipolar type (HCC)   Past Psychiatric History: Schizophrenia versus schizoaffective disorder  Past Medical History:  Past Medical History:  Diagnosis Date   Anemia    Arthritis    Chronic pain    Drug-seeking behavior    Malingering    Osteopetrosis    Psychosis (HCC)    Schizoaffective disorder, bipolar type (HCC)     Past Surgical History:  Procedure Laterality Date   MOUTH SURGERY     TUBAL LIGATION     Family History:  Family History  Family history unknown: Yes   Family Psychiatric  History: Unremarkable Social History:  Social History   Substance and Sexual Activity  Alcohol Use Not Currently   Comment: 1 cocktail 3 weeks ago     Social History   Substance and Sexual Activity  Drug Use Not Currently   Types: Cocaine   Comment: states "it's legal though"    Social History   Socioeconomic History   Marital status: Single    Spouse name: Not on file   Number of children: Not on file   Years of education: Not on file   Highest education level: Not on file  Occupational History   Not on file  Tobacco Use   Smoking status: Every Day    Current packs/day: 0.50    Types: Cigarettes   Smokeless tobacco: Never  Vaping Use   Vaping status: Never Used  Substance and Sexual Activity   Alcohol use: Not Currently    Comment: 1 cocktail 3 weeks ago   Drug use: Not Currently    Types: Cocaine    Comment: states "it's legal  though"   Sexual activity: Never  Other Topics Concern   Not on file  Social History Narrative   Not on file   Social Determinants of Health   Financial Resource Strain: Not on file  Food Insecurity: Food Insecurity Present (08/22/2022)   Hunger Vital Sign    Worried About Running Out of Food in the Last Year: Sometimes true    Ran Out of Food in the Last Year: Sometimes true  Transportation Needs: Patient Unable To Answer (08/22/2022)   PRAPARE - Transportation    Lack of Transportation (Medical): Patient unable to answer    Lack of Transportation (Non-Medical): Patient unable to answer  Recent Concern: Transportation Needs - Unmet Transportation Needs (06/13/2022)   Received from [facility], [facility]   [facility] - Transportation    Lack of Transportation (Medical): Not on file    Lack of Transportation (Non-Medical): Yes  Physical Activity: Not on file  Stress: Not on file  Social Connections: Unknown (08/06/2021)   Received from [facility], [facility]   Social Network    Social Network: Not on file   Additional Social History:                         Sleep: Good  Appetite:  Good  Current Medications: Current Facility-Administered Medications  Medication Dose Route Frequency Provider Last Rate Last Admin   acetaminophen (TYLENOL) tablet 650 mg  650 mg Oral Q6H PRN [name], DO   650 mg at 10/05/22 1813   alum & mag hydroxide-simeth (MAALOX/MYLANTA) 200-200-20 MG/5ML suspension 30 mL  30 mL Oral Q4H PRN [name], DO   30 mL at 09/20/22 1142   diphenhydrAMINE (BENADRYL) capsule 50 mg  50 mg Oral Q6H PRN [name], DO   50 mg at 09/29/22 2254   Or   diphenhydrAMINE (BENADRYL) injection 50 mg  50 mg Intramuscular Q6H PRN [name], DO       haloperidol (HALDOL) tablet 5 mg  5 mg Oral Q6H PRN [name], DO   5 mg at 09/28/22 2149   Or   haloperidol lactate (HALDOL) injection 5 mg  5  mg Intramuscular Q6H PRN [name], DO       ibuprofen (ADVIL) tablet 600 mg  600 mg Oral Q6H PRN [name], [name], MD   600 mg at 10/05/22 2139   LORazepam (ATIVAN) tablet 1 mg  1 mg Oral TID PRN [name], DO   1 mg at 09/27/22 0025   magnesium hydroxide (MILK OF MAGNESIA) suspension 30 mL  30 mL Oral Daily PRN [name], DO       paliperidone (INVEGA) 24 hr tablet 6 mg  6 mg Oral QHS [name], DO   6 mg at 10/05/22 2128   traZODone (DESYREL) tablet 50 mg  50 mg Oral QHS PRN [name], DO   50 mg at 10/05/22 2128    Lab Results: No results found for this or any previous visit (from the past 48 hour(s)).  Blood Alcohol level:  Lab Results  Component Value Date   ETH <10 08/17/2022   ETH <10 01/11/2021    Metabolic Disorder Labs: No results found for: "HGBA1C", "MPG" No results found for: "PROLACTIN" No results found for: "CHOL", "TRIG", "HDL", "CHOLHDL", "VLDL", "LDLCALC"   Musculoskeletal: Strength & Muscle Tone: within normal limits Gait & Station: normal Patient leans: N/A  Psychiatric Specialty Exam:  Presentation  General Appearance:  Casual; Neat  Eye Contact: Fair  Speech: Spontaneous, Garbled  Speech Volume: Normal  Handedness: Right   Mood and Affect  Mood: Good  Affect: Anxious   Thought Process  Thought Processes: Disorganized  Descriptions of Associations:Loose  Orientation:Partial  Thought Content:Illogical; Rumination  Hallucinations: Patient denies auditory hallucination, is observed talking to self in her room, is observed responding to internal stimuli  Ideas of Reference:Paranoia  Suicidal Thoughts:Denies Homicidal Thoughts:Denies  Sensorium  Memory: Immediate Fair; Remote Poor  Judgment: Poor  Insight: Poor   Executive Functions  Concentration: Poor  Attention Span: Fair    Psychomotor Activity  Psychomotor Activity: Hyperactive Assets   Assets: Communication Skills    Blood pressure (!) 124/53, pulse 67, temperature (!) 97 F (36.1 C), resp. rate 16, height 5\' 6"  (1.676 m), weight 64 kg, SpO2 100%. Body mass index is 22.76 kg/m.   Treatment Plan Summary: Daily contact with patient to assess and evaluate symptoms and progress in treatment, Medication management, and Plan continue current medications.  [name], MD

## 2022-10-06 NOTE — Progress Notes (Signed)
Patient is alert and oriented. She denies SI/HI/. She denies AVH however it is clear that she is responding to internal stimuli. She is cooperative. She denies depression and anxiety. Denies pain. She participated in both groups.  No PRN's requested. Discharge planning ongoing.  Q15 minute unit checks in place.

## 2022-10-06 NOTE — Plan of Care (Signed)
  Problem: Clinical Measurements: Goal: Ability to maintain clinical measurements within normal limits will improve Outcome: Progressing   Problem: Safety: Goal: Ability to remain free from injury will improve Outcome: Progressing   Problem: Skin Integrity: Goal: Risk for impaired skin integrity will decrease Outcome: Progressing   

## 2022-10-06 NOTE — Progress Notes (Signed)
   10/06/22 1610  15 Minute Checks  Location Bedroom  Visual Appearance Calm  Behavior Composed  Sleep (Behavioral Health Patients Only)  Calculate sleep? (Click Yes once per 24 hr at 0600 safety check) Yes  Documented sleep last 24 hours 6.5

## 2022-10-06 NOTE — Group Note (Signed)
LCSW Group Therapy Note  Group Date: 10/06/2022 Start Time: 1255 End Time: 1330   Type of Therapy and Topic:  Group Therapy - Healthy vs Unhealthy Coping Skills  Participation Level:  Minimal   Description of Group The focus of this group was to determine what unhealthy coping techniques typically are used by group members and what healthy coping techniques would be helpful in coping with various problems. Patients were guided in becoming aware of the differences between healthy and unhealthy coping techniques. Patients were asked to identify 2-3 healthy coping skills they would like to learn to use more effectively.  Therapeutic Goals Patients learned that coping is what human beings do all day long to deal with various situations in their lives Patients defined and discussed healthy vs unhealthy coping techniques Patients identified their preferred coping techniques and identified whether these were healthy or unhealthy Patients determined 2-3 healthy coping skills they would like to become more familiar with and use more often. 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 Patient proved open to input from peers and feedback from CSW. Patient demonstrated poor insight into the subject matter, was respectful of peers, and participated throughout the entire session.   Therapeutic Modalities Cognitive Behavioral Therapy Motivational Interviewing  Tama Headings 10/06/2022  2:29 PM

## 2022-10-06 NOTE — Progress Notes (Signed)
   10/06/22 2000  Psych Admission Type (Psych Patients Only)  Admission Status Voluntary  Psychosocial Assessment  Patient Complaints None  Eye Contact Brief  Facial Expression Flat  Affect Labile  Speech Slurred  Interaction Minimal  Motor Activity Slow  Appearance/Hygiene Layered clothes  Behavior Characteristics Restless  Mood Pleasant  Thought Process  Coherency Disorganized  Content Preoccupation  Delusions None reported or observed  Perception Hallucinations  Hallucination Auditory;Visual  Judgment Impaired  Confusion Mild  Danger to Self  Current suicidal ideation? Denies  Agreement Not to Harm Self Yes  Description of Agreement verbal  Danger to Others  Danger to Others None reported or observed  Danger to Others Abnormal  Harmful Behavior to others No threats or harm toward other people  Destructive Behavior No threats or harm toward property

## 2022-10-07 DIAGNOSIS — F250 Schizoaffective disorder, bipolar type: Secondary | ICD-10-CM | POA: Diagnosis not present

## 2022-10-07 NOTE — Progress Notes (Signed)
   10/07/22 2100  Psych Admission Type (Psych Patients Only)  Admission Status Voluntary  Psychosocial Assessment  Patient Complaints None  Eye Contact Brief  Facial Expression Flat  Affect Labile  Speech Slurred  Interaction Minimal  Motor Activity Slow  Appearance/Hygiene Layered clothes  Behavior Characteristics Restless  Mood Pleasant  Thought Process  Coherency Disorganized  Content Preoccupation  Delusions None reported or observed  Perception Hallucinations  Hallucination Auditory;Visual  Judgment Impaired  Confusion Mild  Danger to Self  Current suicidal ideation? Denies  Agreement Not to Harm Self Yes  Description of Agreement verbal  Danger to Others  Danger to Others None reported or observed  Danger to Others Abnormal  Harmful Behavior to others No threats or harm toward other people  Destructive Behavior No threats or harm toward property

## 2022-10-07 NOTE — Group Note (Signed)
Date:  10/07/2022 Time:  10:37 AM  Group Topic/Focus:  Rediscovering Joy:   The focus of this group is to explore various ways to relieve stress in a positive manner.    Participation Level:  Active  Participation Quality:  Appropriate  Affect:  Appropriate  Cognitive:  Alert and Oriented  Insight: Good  Engagement in Group:  Engaged  Modes of Intervention:  Activity  Additional Comments:  [name] 10/07/2022, 10:37 AM

## 2022-10-07 NOTE — Progress Notes (Signed)
[facility] MD Progress Note  10/07/2022 12:13 PM Betty Henderson  MRN:  [id number] Subjective:  Patient seen today, chart reviewed, case discussed with staff.  No new events issues reported by staff.  Patient reports she is doing fine  Patient denies thoughts of harming herself or others.  Patient continues to talk to self although patient denies auditory or visual hallucinations.   Patient is awaiting placement.  Principal Problem: Schizoaffective disorder, bipolar type (HCC) Diagnosis: Principal Problem:   Schizoaffective disorder, bipolar type (HCC)   Past Psychiatric History: Schizophrenia versus schizoaffective disorder  Past Medical History:  Past Medical History:  Diagnosis Date   Anemia    Arthritis    Chronic pain    Drug-seeking behavior    Malingering    Osteopetrosis    Psychosis (HCC)    Schizoaffective disorder, bipolar type (HCC)     Past Surgical History:  Procedure Laterality Date   MOUTH SURGERY     TUBAL LIGATION     Family History:  Family History  Family history unknown: Yes   Family Psychiatric  History: Unremarkable Social History:  Social History   Substance and Sexual Activity  Alcohol Use Not Currently   Comment: 1 cocktail 3 weeks ago     Social History   Substance and Sexual Activity  Drug Use Not Currently   Types: Cocaine   Comment: states "it's legal though"    Social History   Socioeconomic History   Marital status: Single    Spouse name: Not on file   Number of children: Not on file   Years of education: Not on file   Highest education level: Not on file  Occupational History   Not on file  Tobacco Use   Smoking status: Every Day    Current packs/day: 0.50    Types: Cigarettes   Smokeless tobacco: Never  Vaping Use   Vaping status: Never Used  Substance and Sexual Activity   Alcohol use: Not Currently    Comment: 1 cocktail 3 weeks ago   Drug use: Not Currently    Types: Cocaine    Comment: states "it's legal though"    Sexual activity: Never  Other Topics Concern   Not on file  Social History Narrative   Not on file   Social Determinants of Health   Financial Resource Strain: Not on file  Food Insecurity: Food Insecurity Present (08/22/2022)   Hunger Vital Sign    Worried About Running Out of Food in the Last Year: Sometimes true    Ran Out of Food in the Last Year: Sometimes true  Transportation Needs: Patient Unable To Answer (08/22/2022)   PRAPARE - Transportation    Lack of Transportation (Medical): Patient unable to answer    Lack of Transportation (Non-Medical): Patient unable to answer  Recent Concern: Transportation Needs - Unmet Transportation Needs (06/13/2022)   Received from [facility], [facility]   [facility] - Transportation    Lack of Transportation (Medical): Not on file    Lack of Transportation (Non-Medical): Yes  Physical Activity: Not on file  Stress: Not on file  Social Connections: Unknown (08/06/2021)   Received from [facility], [facility]   Social Network    Social Network: Not on file   Additional Social History:                         Sleep: Good  Appetite:  Good  Current Medications: Current Facility-Administered Medications  Medication  Dose Route Frequency Provider Last Rate Last Admin   acetaminophen (TYLENOL) tablet 650 mg  650 mg Oral Q6H PRN [name], DO   650 mg at 10/05/22 1813   alum & mag hydroxide-simeth (MAALOX/MYLANTA) 200-200-20 MG/5ML suspension 30 mL  30 mL Oral Q4H PRN [name], DO   30 mL at 09/20/22 1142   diphenhydrAMINE (BENADRYL) capsule 50 mg  50 mg Oral Q6H PRN [name], DO   50 mg at 09/29/22 2254   Or   diphenhydrAMINE (BENADRYL) injection 50 mg  50 mg Intramuscular Q6H PRN [name], DO       haloperidol (HALDOL) tablet 5 mg  5 mg Oral Q6H PRN [name], DO   5 mg at 09/28/22 2149   Or   haloperidol lactate (HALDOL) injection 5 mg  5 mg  Intramuscular Q6H PRN [name], DO       ibuprofen (ADVIL) tablet 600 mg  600 mg Oral Q6H PRN [name], [name], MD   600 mg at 10/05/22 2139   LORazepam (ATIVAN) tablet 1 mg  1 mg Oral TID PRN [name], DO   1 mg at 09/27/22 0025   magnesium hydroxide (MILK OF MAGNESIA) suspension 30 mL  30 mL Oral Daily PRN [name], DO       paliperidone (INVEGA) 24 hr tablet 6 mg  6 mg Oral QHS [name], DO   6 mg at 10/06/22 2147   traZODone (DESYREL) tablet 50 mg  50 mg Oral QHS PRN [name], DO   50 mg at 10/06/22 2147    Lab Results: No results found for this or any previous visit (from the past 48 hour(s)).  Blood Alcohol level:  Lab Results  Component Value Date   ETH <10 08/17/2022   ETH <10 01/11/2021    Metabolic Disorder Labs: No results found for: "HGBA1C", "MPG" No results found for: "PROLACTIN" No results found for: "CHOL", "TRIG", "HDL", "CHOLHDL", "VLDL", "LDLCALC"   Musculoskeletal: Strength & Muscle Tone: within normal limits Gait & Station: normal Patient leans: N/A  Psychiatric Specialty Exam:  Presentation  General Appearance:  Casual; Neat  Eye Contact: Fair  Speech: Spontaneous, Garbled  Speech Volume: Normal  Handedness: Right   Mood and Affect  Mood: Good  Affect: Anxious   Thought Process  Thought Processes: Disorganized  Descriptions of Associations:Loose  Orientation:Partial  Thought Content:Illogical; Rumination  Hallucinations: Patient denies auditory hallucination, is observed talking to self in her room, is observed responding to internal stimuli  Ideas of Reference:Paranoia  Suicidal Thoughts:Denies Homicidal Thoughts:Denies  Sensorium  Memory: Immediate Fair; Remote Poor  Judgment: Poor  Insight: Poor   Executive Functions  Concentration: Poor  Attention Span: Fair    Psychomotor Activity  Psychomotor Activity: Hyperactive Assets   Assets: Communication Skills    Blood pressure 117/74, pulse 68, temperature (!) 97.5 F (36.4 C), resp. rate 16, height 5\' 6"  (1.676 m), weight 64 kg, SpO2 100%. Body mass index is 22.76 kg/m.   Treatment Plan Summary: Daily contact with patient to assess and evaluate symptoms and progress in treatment, Medication management, and Plan continue current medications.  [name], MD

## 2022-10-07 NOTE — Progress Notes (Signed)
Patient was admitted on Aug 22, 2022 for worsening psychosis. She denies SI/HI. She denies AVH although she is often heard shouting out when no one is in her bedroom. Affect labile. Mood pleasant. Pain 0/10. Appetite good. Patient participated in 2/3 groups and was cooperative on the unit.  Discharge planning is ongoing.  Q15 minute unit checks in place.

## 2022-10-07 NOTE — Group Note (Signed)
Recreation Therapy Group Note   Group Topic:Emotion Expression  Group Date: 10/07/2022 Start Time: 1400 End Time: 1455 Facilitators: Rosina Lowenstein, LRT, CTRS Location:  Dayroom  Group Description: Positivity Collage. LRT and patients discussed the importance of having a positive mindset and being happy. Patients received magazines, safety scissors, a glue stick and a piece of paper. Pts were encouraged to find images or words in the magazines that showed "happiness" or positivity to them. Pt shared their collage with the group once they were finished. LRT and pts discussed how it can be difficult to always have a positive mindset, especially when they have mental health challenges.    Goal Area(s) Addressed:  Pt will identify things associate with positivity. Pt will reduce negative thinking. Pt will identify a new coping skill of thinking positive thoughts.    Affect/Mood: Appropriate   Participation Level: Active and Engaged   Participation Quality: Independent   Behavior: Appropriate, Calm, and Cooperative   Speech/Thought Process: Coherent   Insight: Good   Judgement: Good   Modes of Intervention: Art   Patient Response to Interventions:  Attentive, Engaged, Interested , and Receptive   Education Outcome:  Acknowledges education   Clinical Observations/Individualized Feedback: Diamonds was active in their participation of session activities and group discussion. Pt identified "A little boy waking up in the morning and eating cereal that his mom bought for him" in different images as something that was positive. Pt interacted well with LRT and peers duration of session.   Plan: Continue to engage patient in RT group sessions 2-3x/week.   Rosina Lowenstein, LRT, CTRS 10/07/2022 3:47 PM

## 2022-10-07 NOTE — Plan of Care (Signed)
  Problem: Education: Goal: Knowledge of General Education information will improve Description: Including pain rating scale, medication(s)/side effects and non-pharmacologic comfort measures Outcome: Progressing   Problem: Health Behavior/Discharge Planning: Goal: Ability to manage health-related needs will improve Outcome: Progressing   Problem: Nutrition: Goal: Adequate nutrition will be maintained Outcome: Progressing   

## 2022-10-07 NOTE — Group Note (Signed)
Date:  10/07/2022 Time:  8:44 PM  Group Topic/Focus:  Movie therapy     Participation Level:  Minimal  Participation Quality:  Inattentive  Affect:  Appropriate  Cognitive:  Alert  Insight: None  Engagement in Group:  Limited  Modes of Intervention:  Exploration  Additional Comments:    [name] 10/07/2022, 8:44 PM

## 2022-10-07 NOTE — Group Note (Signed)
LCSW Group Therapy Note  Group Date: 10/07/2022 Start Time: 1315 End Time: 1400   Type of Therapy and Topic:  Group Therapy - Healthy vs Unhealthy Coping Skills  Participation Level:  None   Description of Group The focus of this group was to determine what unhealthy coping techniques typically are used by group members and what healthy coping techniques would be helpful in coping with various problems. Patients were guided in becoming aware of the differences between healthy and unhealthy coping techniques. Patients were asked to identify 2-3 healthy coping skills they would like to learn to use more effectively.  Therapeutic Goals Patients learned that coping is what human beings do all day long to deal with various situations in their lives Patients defined and discussed healthy vs unhealthy coping techniques Patients identified their preferred coping techniques and identified whether these were healthy or unhealthy Patients determined 2-3 healthy coping skills they would like to become more familiar with and use more often. Patients provided support and ideas to each other   Summary of Patient Progress:  During group pt arrived late and did not contribute to conversation.    Therapeutic Modalities Cognitive Behavioral Therapy Motivational Interviewing  Elza Rafter, [place] 10/07/2022  3:29 PM

## 2022-10-07 NOTE — Progress Notes (Signed)
   10/07/22 0557  15 Minute Checks  Location Bedroom  Visual Appearance Calm  Behavior Sleeping  Sleep (Behavioral Health Patients Only)  Calculate sleep? (Click Yes once per 24 hr at 0600 safety check) Yes  Documented sleep last 24 hours 12.25

## 2022-10-07 NOTE — BH IP Treatment Plan (Signed)
Interdisciplinary Treatment and Diagnostic Plan Update  10/07/2022 Time of Session: 9:00 AM Betty Henderson MRN: [id number]  Principal Diagnosis: Schizoaffective disorder, bipolar type (HCC)  Secondary Diagnoses: Principal Problem:   Schizoaffective disorder, bipolar type (HCC)   Current Medications:  Current Facility-Administered Medications  Medication Dose Route Frequency Provider Last Rate Last Admin   acetaminophen (TYLENOL) tablet 650 mg  650 mg Oral Q6H PRN [name], DO   650 mg at 10/05/22 1813   alum & mag hydroxide-simeth (MAALOX/MYLANTA) 200-200-20 MG/5ML suspension 30 mL  30 mL Oral Q4H PRN [name], DO   30 mL at 09/20/22 1142   diphenhydrAMINE (BENADRYL) capsule 50 mg  50 mg Oral Q6H PRN [name], DO   50 mg at 09/29/22 2254   Or   diphenhydrAMINE (BENADRYL) injection 50 mg  50 mg Intramuscular Q6H PRN [name], DO       haloperidol (HALDOL) tablet 5 mg  5 mg Oral Q6H PRN [name], DO   5 mg at 09/28/22 2149   Or   haloperidol lactate (HALDOL) injection 5 mg  5 mg Intramuscular Q6H PRN [name], DO       ibuprofen (ADVIL) tablet 600 mg  600 mg Oral Q6H PRN [name], [name], MD   600 mg at 10/05/22 2139   LORazepam (ATIVAN) tablet 1 mg  1 mg Oral TID PRN [name], DO   1 mg at 09/27/22 0025   magnesium hydroxide (MILK OF MAGNESIA) suspension 30 mL  30 mL Oral Daily PRN [name], DO       paliperidone (INVEGA) 24 hr tablet 6 mg  6 mg Oral QHS [name], DO   6 mg at 10/06/22 2147   traZODone (DESYREL) tablet 50 mg  50 mg Oral QHS PRN [name], DO   50 mg at 10/06/22 2147   PTA Medications: Medications Prior to Admission  Medication Sig Dispense Refill Last Dose   ondansetron (ZOFRAN) 4 MG tablet Take 1 tablet (4 mg total) by mouth every 8 (eight) hours as needed for nausea or vomiting. (Patient not taking: Reported on  08/17/2022) 20 tablet 0     Patient Stressors: Financial difficulties   Health problems   Medication change or noncompliance    Patient Strengths: Ability for insight   Treatment Modalities: Medication Management, Group therapy, Case management,  1 to 1 session with clinician, Psychoeducation, Recreational therapy.   Physician Treatment Plan for Primary Diagnosis: Schizoaffective disorder, bipolar type (HCC) Long Term Goal(s): Improvement in symptoms so as ready for discharge   Short Term Goals: Ability to identify changes in lifestyle to reduce recurrence of condition will improve Ability to verbalize feelings will improve Ability to disclose and discuss suicidal ideas Ability to demonstrate self-control will improve Ability to identify and develop effective coping behaviors will improve Ability to maintain clinical measurements within normal limits will improve Compliance with prescribed medications will improve Ability to identify triggers associated with substance abuse/mental health issues will improve  Medication Management: Evaluate patient's response, side effects, and tolerance of medication regimen.  Therapeutic Interventions: 1 to 1 sessions, Unit Group sessions and Medication administration.  Evaluation of Outcomes: Not Met  Physician Treatment Plan for Secondary Diagnosis: Principal Problem:   Schizoaffective disorder, bipolar type (HCC)  Long Term Goal(s): Improvement in symptoms so as ready for discharge   Short Term Goals: Ability to identify changes in lifestyle to reduce recurrence of condition will improve  Ability to verbalize feelings will improve Ability to disclose and discuss suicidal ideas Ability to demonstrate self-control will improve Ability to identify and develop effective coping behaviors will improve Ability to maintain clinical measurements within normal limits will improve Compliance with prescribed medications will improve Ability to  identify triggers associated with substance abuse/mental health issues will improve     Medication Management: Evaluate patient's response, side effects, and tolerance of medication regimen.  Therapeutic Interventions: 1 to 1 sessions, Unit Group sessions and Medication administration.  Evaluation of Outcomes: Not Met   RN Treatment Plan for Primary Diagnosis: Schizoaffective disorder, bipolar type (HCC) Long Term Goal(s): Knowledge of disease and therapeutic regimen to maintain health will improve  Short Term Goals: Ability to remain free from injury will improve, Ability to verbalize frustration and anger appropriately will improve, Ability to demonstrate self-control, Ability to participate in decision making will improve, Ability to verbalize feelings will improve, Ability to disclose and discuss suicidal ideas, Ability to identify and develop effective coping behaviors will improve, and Compliance with prescribed medications will improve  Medication Management: RN will administer medications as ordered by provider, will assess and evaluate patient's response and provide education to patient for prescribed medication. RN will report any adverse and/or side effects to prescribing provider.  Therapeutic Interventions: 1 on 1 counseling sessions, Psychoeducation, Medication administration, Evaluate responses to treatment, Monitor vital signs and CBGs as ordered, Perform/monitor CIWA, COWS, AIMS and Fall Risk screenings as ordered, Perform wound care treatments as ordered.  Evaluation of Outcomes: Not Met   LCSW Treatment Plan for Primary Diagnosis: Schizoaffective disorder, bipolar type (HCC) Long Term Goal(s): Safe transition to appropriate next level of care at discharge, Engage patient in therapeutic group addressing interpersonal concerns.  Short Term Goals: Engage patient in aftercare planning with referrals and resources, Increase social support, Increase ability to appropriately  verbalize feelings, Increase emotional regulation, Facilitate acceptance of mental health diagnosis and concerns, Facilitate patient progression through stages of change regarding substance use diagnoses and concerns, and Increase skills for wellness and recovery  Therapeutic Interventions: Assess for all discharge needs, 1 to 1 time with Social worker, Explore available resources and support systems, Assess for adequacy in community support network, Educate family and significant other(s) on suicide prevention, Complete Psychosocial Assessment, Interpersonal group therapy.  Evaluation of Outcomes: Not Met   Progress in Treatment: Attending groups: Yes. and No. Participating in groups: Yes. and No. Taking medication as prescribed: Yes. Toleration medication: Yes. Family/Significant other contact made: Yes, individual(s) contacted:  SPE completed with the patient's son  Patient understands diagnosis: No. Discussing patient identified problems/goals with staff: Yes. Medical problems stabilized or resolved: Yes. Denies suicidal/homicidal ideation: Yes. Issues/concerns per patient self-inventory: No. Other: [place]  New problem(s) identified: No, Describe:  none Update 09/27/2022:  No changes at this time.  Update 10/02/2022:  No changes at this time. Update 10/07/22: No changes at this time    New Short Term/Long Term Goal(s): Update 6/30: none at this time. Update 09/27/2022:  No changes at this time.  Update 10/02/2022:  No changes at this time. Update 10/07/22: No changes at this time      Patient Goals:  Update 6/30: none at this time. Update 09/27/2022:  No changes at this time. Update 10/02/2022:  No changes at this time. Update 10/07/22: No changes at this time     Discharge Plan or Barriers: Update 6/30: APS report has been made and patient is being investigated for guardianship needs.  Remains homeless with  limited supports. Update 09/27/2022:  No changes at this time.  Update 10/02/2022:  Patient  remains safe on the unit at this time.  Patient remains psychotic at this time.  APS report has been made, however, no follow up from the caseworker on her case.  No safe discharge identified.  CSW has requested that application for Medicaid and disability be completed.   Update 10/07/22: No changes at this time     Reason for Continuation of Hospitalization: Hallucinations Mania Medication stabilization   Estimated Length of Stay: Update 6/30: 1-7 days Update 09/27/2022:  TBD Update 10/02/2022:  No changes at this time. Update 10/07/22: No changes at this time   Last 3 [place] Suicide Severity Risk Score: Flowsheet Row Admission (Current) from 08/22/2022 in [facility] ED from 08/17/2022 in [facility] Emergency Department at [facility] ED from 08/12/2022 in [facility] Emergency Department at [facility]  C-SSRS RISK CATEGORY Low Risk No Risk No Risk       Last [facility] 2/9 Scores:     No data to display          Scribe for Treatment Team: Laretta Alstrom 10/07/2022 3:35 PM

## 2022-10-08 DIAGNOSIS — F250 Schizoaffective disorder, bipolar type: Secondary | ICD-10-CM | POA: Diagnosis not present

## 2022-10-08 NOTE — Progress Notes (Signed)
   10/08/22 1500  Psych Admission Type (Psych Patients Only)  Admission Status Voluntary  Psychosocial Assessment  Patient Complaints None  Eye Contact Fair  Facial Expression Flat  Affect Flat  Speech Slurred  Interaction Poor  Motor Activity Slow  Appearance/Hygiene Improved  Behavior Characteristics Calm;Cooperative  Mood Pleasant  Thought Process  Coherency Disorganized  Content Preoccupation  Delusions None reported or observed  Perception Hallucinations  Hallucination Auditory;Visual  Judgment Impaired  Confusion Mild  Danger to Self  Current suicidal ideation? Denies  Agreement Not to Harm Self Yes  Danger to Others  Danger to Others None reported or observed  Danger to Others Abnormal  Harmful Behavior to others No threats or harm toward other people  Destructive Behavior No threats or harm toward property   Patient had a quiet day. Interactive minimally with staff but not with peers. Tolerated all meals. Showered and changed clothes. Denies SI/HI/AVH

## 2022-10-08 NOTE — Progress Notes (Signed)
[facility] MD Progress Note  10/08/2022 11:15 AM Betty Henderson  MRN:  [id number] Subjective: Betty Henderson is seen on rounds.  She has been compliant with her Invega at bedtime for the most part.  No side effects.  She has no complaints.  Nurses report no issues.  She does have a guardian because she is a ward of the state.  Social work continues to look for placement. Principal Problem: Schizoaffective disorder, bipolar type (HCC) Diagnosis: Principal Problem:   Schizoaffective disorder, bipolar type (HCC)  Total Time spent with patient: 15 minutes  Past Psychiatric History: Schizoaffective disorder bipolar type  Past Medical History:  Past Medical History:  Diagnosis Date   Anemia    Arthritis    Chronic pain    Drug-seeking behavior    Malingering    Osteopetrosis    Psychosis (HCC)    Schizoaffective disorder, bipolar type (HCC)     Past Surgical History:  Procedure Laterality Date   MOUTH SURGERY     TUBAL LIGATION     Family History:  Family History  Family history unknown: Yes   Family Psychiatric  History: Unremarkable Social History:  Social History   Substance and Sexual Activity  Alcohol Use Not Currently   Comment: 1 cocktail 3 weeks ago     Social History   Substance and Sexual Activity  Drug Use Not Currently   Types: Cocaine   Comment: states "it's legal though"    Social History   Socioeconomic History   Marital status: Single    Spouse name: Not on file   Number of children: Not on file   Years of education: Not on file   Highest education level: Not on file  Occupational History   Not on file  Tobacco Use   Smoking status: Every Day    Current packs/day: 0.50    Types: Cigarettes   Smokeless tobacco: Never  Vaping Use   Vaping status: Never Used  Substance and Sexual Activity   Alcohol use: Not Currently    Comment: 1 cocktail 3 weeks ago   Drug use: Not Currently    Types: Cocaine    Comment: states "it's legal though"   Sexual activity:  Never  Other Topics Concern   Not on file  Social History Narrative   Not on file   Social Determinants of Health   Financial Resource Strain: Not on file  Food Insecurity: Food Insecurity Present (08/22/2022)   Hunger Vital Sign    Worried About Running Out of Food in the Last Year: Sometimes true    Ran Out of Food in the Last Year: Sometimes true  Transportation Needs: Patient Unable To Answer (08/22/2022)   PRAPARE - Transportation    Lack of Transportation (Medical): Patient unable to answer    Lack of Transportation (Non-Medical): Patient unable to answer  Recent Concern: Transportation Needs - Unmet Transportation Needs (06/13/2022)   Received from [facility]   [facility] - Transportation    Lack of Transportation (Medical): Not on file    Lack of Transportation (Non-Medical): Yes  Physical Activity: Not on file  Stress: Not on file  Social Connections: Unknown (08/06/2021)   Received from [facility], [facility]   Social Network    Social Network: Not on file   Additional Social History:                         Sleep: Good  Appetite:  Good  Current Medications: Current Facility-Administered Medications  Medication Dose Route Frequency Provider Last Rate Last Admin   acetaminophen (TYLENOL) tablet 650 mg  650 mg Oral Q6H PRN [name], DO   650 mg at 10/05/22 1813   alum & mag hydroxide-simeth (MAALOX/MYLANTA) 200-200-20 MG/5ML suspension 30 mL  30 mL Oral Q4H PRN [name], DO   30 mL at 09/20/22 1142   diphenhydrAMINE (BENADRYL) capsule 50 mg  50 mg Oral Q6H PRN [name], DO   50 mg at 09/29/22 2254   Or   diphenhydrAMINE (BENADRYL) injection 50 mg  50 mg Intramuscular Q6H PRN [name], DO       haloperidol (HALDOL) tablet 5 mg  5 mg Oral Q6H PRN [name], DO   5 mg at 09/28/22 2149   Or   haloperidol lactate (HALDOL) injection 5 mg  5 mg Intramuscular Q6H PRN  [name], DO       ibuprofen (ADVIL) tablet 600 mg  600 mg Oral Q6H PRN [name], [name], MD   600 mg at 10/05/22 2139   LORazepam (ATIVAN) tablet 1 mg  1 mg Oral TID PRN [name], DO   1 mg at 09/27/22 0025   magnesium hydroxide (MILK OF MAGNESIA) suspension 30 mL  30 mL Oral Daily PRN [name], DO       paliperidone (INVEGA) 24 hr tablet 6 mg  6 mg Oral QHS [name], DO   6 mg at 10/07/22 2116   traZODone (DESYREL) tablet 50 mg  50 mg Oral QHS PRN [name], DO   50 mg at 10/07/22 2116    Lab Results: No results found for this or any previous visit (from the past 48 hour(s)).  Blood Alcohol level:  Lab Results  Component Value Date   ETH <10 08/17/2022   ETH <10 01/11/2021    Metabolic Disorder Labs: No results found for: "HGBA1C", "MPG" No results found for: "PROLACTIN" No results found for: "CHOL", "TRIG", "HDL", "CHOLHDL", "VLDL", "LDLCALC"  Physical Findings: AIMS:  , ,  ,  ,    CIWA:    COWS:     Musculoskeletal: Strength & Muscle Tone: within normal limits Gait & Station: normal Patient leans: N/A  Psychiatric Specialty Exam:  Presentation  General Appearance:  Casual; Neat  Eye Contact: Fair  Speech: Slow; Slurred  Speech Volume: Decreased  Handedness: Right   Mood and Affect  Mood: Anxious  Affect: Inappropriate   Thought Process  Thought Processes: Disorganized  Descriptions of Associations:Loose  Orientation:Partial  Thought Content:Illogical; Rumination  History of Schizophrenia/Schizoaffective disorder:No data recorded Duration of Psychotic Symptoms:No data recorded Hallucinations:No data recorded Ideas of Reference:Paranoia  Suicidal Thoughts:No data recorded Homicidal Thoughts:No data recorded  Sensorium  Memory: Immediate Fair; Remote Poor  Judgment: Poor  Insight: Poor   Executive Functions  Concentration: Poor  Attention  Span: Fair  Recall: [employer] of Knowledge: Fair  Language: Fair   Psychomotor Activity  Psychomotor Activity:No data recorded  Assets  Assets: Communication Skills   Sleep  Sleep:No data recorded    Blood pressure 119/70, pulse 71, temperature 98.4 F (36.9 C), resp. rate 14, height 5\' 6"  (1.676 m), weight 64 kg, SpO2 100%. Body mass index is 22.76 kg/m.   Treatment Plan Summary: Daily contact with patient to assess and evaluate symptoms and progress in treatment, Medication management, and Plan continue current medication.  Alnita Aybar Tresea Mall, DO 10/08/2022, 11:15 AM

## 2022-10-08 NOTE — Group Note (Signed)
Date:  10/08/2022 Time:  8:19 PM  Group Topic/Focus:  Building Self Esteem:   The Focus of this group is helping patients become aware of the effects of self-esteem on their lives, the things they and others do that enhance or undermine their self-esteem, seeing the relationship between their level of self-esteem and the choices they make and learning ways to enhance self-esteem.    Participation Level:  Minimal  Participation Quality:  Inattentive  Affect:  Flat  Cognitive:  Alert  Insight: Lacking  Engagement in Group:  Distracting  Modes of Intervention:  Education  Additional Comments:    [name] 10/08/2022, 8:19 PM

## 2022-10-08 NOTE — BHH Counselor (Signed)
CSW contacted [facility] APS and LVM regarding status of report.   Reynaldo Minium, MSW, [place] 10/08/2022 9:33 AM

## 2022-10-08 NOTE — Progress Notes (Signed)
   10/08/22 0603  15 Minute Checks  Location Bedroom  Visual Appearance Calm  Behavior Sleeping  Sleep (Behavioral Health Patients Only)  Calculate sleep? (Click Yes once per 24 hr at 0600 safety check) Yes  Documented sleep last 24 hours 12

## 2022-10-08 NOTE — Progress Notes (Signed)
   10/08/22 2200  Psych Admission Type (Psych Patients Only)  Admission Status Voluntary  Psychosocial Assessment  Patient Complaints None  Eye Contact Fair  Facial Expression Flat  Affect Flat  Speech Slurred  Interaction Poor  Motor Activity Slow  Appearance/Hygiene Improved  Behavior Characteristics Cooperative;Calm  Mood Pleasant  Thought Process  Coherency Disorganized  Content Preoccupation  Delusions None reported or observed  Perception Hallucinations  Hallucination Auditory;Visual  Judgment Impaired  Confusion Mild  Danger to Self  Current suicidal ideation? Denies  Agreement Not to Harm Self Yes  Description of Agreement verbal  Danger to Others  Danger to Others None reported or observed  Danger to Others Abnormal  Harmful Behavior to others No threats or harm toward other people  Destructive Behavior No threats or harm toward property

## 2022-10-08 NOTE — BHH Counselor (Signed)
CSW returned missed call from [facility] DSS.   Shonte Gravely Child psychotherapist at [employer] stated she was the caseworker on pt's case and stated we would be informed of the status of the case at the 30 day mark.   Shonte stated she could be contacted at [phone].    Reynaldo Minium, MSW, [facility] 10/08/2022 9:47 AM

## 2022-10-08 NOTE — Group Note (Signed)
Recreation Therapy Group Note   Group Topic:Problem Solving  Group Date: 10/08/2022 Start Time: 1400 End Time: 1445 Facilitators: Rosina Lowenstein, LRT, CTRS Location:  Day Room  Group Description: Life Boat. Patients were given the scenario that they are on a boat that is about to become shipwrecked, leaving them stranded on an [place]. They are asked to make a list of 10 different items that they want to take with them when they are stranded on the [place]. Patients are asked to rank their items from most important to least important, #1 being the most important and #10 being the least. Patients will work individually for the first round to come up with 10 items and then as a group to condense their list and come up with one list of 10 items between all of them. Patients or LRT will read aloud the 10 different items to the group after each round. LRT facilitated post-activity processing to discuss how this activity can be used in daily life post discharge.   Goal Area(s) Addressed:  Patient will identify priorities, wants and needs. Patient will communicate with LRT and peers. Patient will work collectively as a Administrator, Civil Service. Patient will work on Product manager.     Affect/Mood: Appropriate and Full range   Participation Level: Active and Engaged   Participation Quality: Minimal Cues   Behavior: Cooperative   Speech/Thought Process: Coherent   Insight: Fair   Judgement: Fair    Modes of Intervention: Activity   Patient Response to Interventions:  Receptive   Education Outcome:  Acknowledges education   Clinical Observations/Individualized Feedback: Betty Henderson was active in their participation of session activities and group discussion. Pt identified "a purse, make up, medicine, money, and food" as items she will bring with her on the [place]. Pt became agitated, cussing and talking to herself while looking in the direction where no one was. Pt was receptive to redirection and  continued in group with no problem.   Plan: Continue to engage patient in RT group sessions 2-3x/week.   Rosina Lowenstein, LRT, CTRS 10/08/2022 2:53 PM

## 2022-10-09 DIAGNOSIS — F250 Schizoaffective disorder, bipolar type: Secondary | ICD-10-CM | POA: Diagnosis not present

## 2022-10-09 NOTE — Progress Notes (Signed)
   10/09/22 1610  15 Minute Checks  Location Bedroom  Visual Appearance Calm  Behavior Sleeping  Sleep (Behavioral Health Patients Only)  Calculate sleep? (Click Yes once per 24 hr at 0600 safety check) Yes  Documented sleep last 24 hours 8

## 2022-10-09 NOTE — Progress Notes (Signed)
   10/09/22 0700  Psych Admission Type (Psych Patients Only)  Admission Status Voluntary  Psychosocial Assessment  Eye Contact Fair  Facial Expression Flat  Affect Flat  Speech Slurred  Interaction Poor  Motor Activity Slow  Appearance/Hygiene Improved  Thought Process  Coherency Disorganized  Content Preoccupation  Delusions None reported or observed  Perception Hallucinations  Hallucination Auditory;Visual  Judgment Impaired  Confusion Mild  Danger to Self  Current suicidal ideation? Denies  Agreement Not to Harm Self Yes  Danger to Others  Danger to Others None reported or observed  Danger to Others Abnormal  Harmful Behavior to others No threats or harm toward other people  Destructive Behavior No threats or harm toward property   Patient appears stable through shift. Tolerated all meals. Continues to respond internal stimuli with AVH present. No HI noted. Attended group. Minimal interaction with peers and staff.

## 2022-10-09 NOTE — Plan of Care (Signed)
  Problem: Education: Goal: Knowledge of General Education information will improve Description: Including pain rating scale, medication(s)/side effects and non-pharmacologic comfort measures Outcome: Progressing   Problem: Health Behavior/Discharge Planning: Goal: Ability to manage health-related needs will improve Outcome: Progressing   Problem: Nutrition: Goal: Adequate nutrition will be maintained Outcome: Progressing   

## 2022-10-09 NOTE — Group Note (Signed)
Date:  10/09/2022 Time:  10:52 AM  Group Topic/Focus:  Healthy Communication:   The focus of this group is to discuss communication, barriers to communication, as well as healthy ways to communicate with others.    Participation Level:  Active  Participation Quality:  Appropriate  Affect:  Appropriate and Lethargic  Cognitive:  Disorganized and Confused  Insight: Improving and Limited  Engagement in Group:  Improving and Limited  Modes of Intervention:  Activity and Discussion  Additional Comments:    [name] 10/09/2022, 10:52 AM

## 2022-10-09 NOTE — Group Note (Signed)
Date:  10/09/2022 Time:  8:28 PM  Group Topic/Focus:  Goals Group:   The focus of this group is to help patients establish daily goals to achieve during treatment and discuss how the patient can incorporate goal setting into their daily lives to aide in recovery. Self Care:   The focus of this group is to help patients understand the importance of self-care in order to improve or restore emotional, physical, spiritual, interpersonal, and financial health. Wrap-Up Group:   The focus of this group is to help patients review their daily goal of treatment and discuss progress on daily workbooks.    Participation Level:  Minimal  Participation Quality:  Appropriate  Affect:  Appropriate  Cognitive:  Appropriate  Insight: Appropriate  Engagement in Group:  Limited  Modes of Intervention:  Activity  Additional Comments:    [name] 10/09/2022, 8:28 PM

## 2022-10-09 NOTE — Group Note (Signed)
Recreation Therapy Group Note   Group Topic:Relaxation  Group Date: 10/09/2022 Start Time: 1400 End Time: 1440 Facilitators: Rosina Lowenstein, LRT, CTRS Location:  Dayroom   Group Description: Meditation. LRT and patients discussed what they know about meditation and mindfulness. LRT played a Deep Breathing Meditation exercise script for patients to follow along to. LRT and patients discussed how meditation and deep breathing can be used as a coping skill post--discharge.    Goal Area(s) Addressed:  Patient will practice using relaxation technique. Patient will identify a new coping skill.  Patient will follow multistep directions to reduce anxiety and stress.   Affect/Mood: N/A   Participation Level: Did not attend    Clinical Observations/Individualized Feedback: Aarthi did not attend group.  Plan: Continue to engage patient in RT group sessions 2-3x/week.   Rosina Lowenstein, LRT, CTRS 10/09/2022 3:34 PM

## 2022-10-09 NOTE — Progress Notes (Signed)
[facility] MD Progress Note  10/09/2022 12:05 PM Darilyn Storbeck  MRN:  [id number] Subjective: Betty Henderson is seen on rounds.  She is guardian of the state and needs to wait 30 days before she can be placed.  She is taking Invega at bedtime.  She denies any side effects.  No evidence of EPS or TD.  She has no complaints.  Nurses report no issues.  She has been pleasant and cooperative on the unit.  No problems and no issues. Principal Problem: Schizoaffective disorder, bipolar type (HCC) Diagnosis: Principal Problem:   Schizoaffective disorder, bipolar type (HCC)  Total Time spent with patient: 15 minutes  Past Psychiatric History: Schizoaffective disorder bipolar type  Past Medical History:  Past Medical History:  Diagnosis Date   Anemia    Arthritis    Chronic pain    Drug-seeking behavior    Malingering    Osteopetrosis    Psychosis (HCC)    Schizoaffective disorder, bipolar type (HCC)     Past Surgical History:  Procedure Laterality Date   MOUTH SURGERY     TUBAL LIGATION     Family History:  Family History  Family history unknown: Yes   Family Psychiatric  History: Unremarkable Social History:  Social History   Substance and Sexual Activity  Alcohol Use Not Currently   Comment: 1 cocktail 3 weeks ago     Social History   Substance and Sexual Activity  Drug Use Not Currently   Types: Cocaine   Comment: states "it's legal though"    Social History   Socioeconomic History   Marital status: Single    Spouse name: Not on file   Number of children: Not on file   Years of education: Not on file   Highest education level: Not on file  Occupational History   Not on file  Tobacco Use   Smoking status: Every Day    Current packs/day: 0.50    Types: Cigarettes   Smokeless tobacco: Never  Vaping Use   Vaping status: Never Used  Substance and Sexual Activity   Alcohol use: Not Currently    Comment: 1 cocktail 3 weeks ago   Drug use: Not Currently    Types: Cocaine     Comment: states "it's legal though"   Sexual activity: Never  Other Topics Concern   Not on file  Social History Narrative   Not on file   Social Determinants of Health   Financial Resource Strain: Not on file  Food Insecurity: Food Insecurity Present (08/22/2022)   Hunger Vital Sign    Worried About Running Out of Food in the Last Year: Sometimes true    Ran Out of Food in the Last Year: Sometimes true  Transportation Needs: Patient Unable To Answer (08/22/2022)   PRAPARE - Transportation    Lack of Transportation (Medical): Patient unable to answer    Lack of Transportation (Non-Medical): Patient unable to answer  Recent Concern: Transportation Needs - Unmet Transportation Needs (06/13/2022)   Received [facility], [facility]   [facility] - Transportation    Lack of Transportation (Medical): Not on file    Lack of Transportation (Non-Medical): Yes  Physical Activity: Not on file  Stress: Not on file  Social Connections: Unknown (08/06/2021)   Received from [facility], [facility]   Social Network    Social Network: Not on file   Additional Social History:  Sleep: Good  Appetite:  Good  Current Medications: Current Facility-Administered Medications  Medication Dose Route Frequency Provider Last Rate Last Admin   acetaminophen (TYLENOL) tablet 650 mg  650 mg Oral Q6H PRN [name], DO   650 mg at 10/05/22 1813   alum & mag hydroxide-simeth (MAALOX/MYLANTA) 200-200-20 MG/5ML suspension 30 mL  30 mL Oral Q4H PRN [name], DO   30 mL at 09/20/22 1142   diphenhydrAMINE (BENADRYL) capsule 50 mg  50 mg Oral Q6H PRN [name], DO   50 mg at 09/29/22 2254   Or   diphenhydrAMINE (BENADRYL) injection 50 mg  50 mg Intramuscular Q6H PRN [name], DO       haloperidol (HALDOL) tablet 5 mg  5 mg Oral Q6H PRN [name], DO   5 mg at 09/28/22 2149   Or   haloperidol lactate  (HALDOL) injection 5 mg  5 mg Intramuscular Q6H PRN [name], DO       ibuprofen (ADVIL) tablet 600 mg  600 mg Oral Q6H PRN [name], [name], MD   600 mg at 10/08/22 2134   LORazepam (ATIVAN) tablet 1 mg  1 mg Oral TID PRN [name], DO   1 mg at 09/27/22 0025   magnesium hydroxide (MILK OF MAGNESIA) suspension 30 mL  30 mL Oral Daily PRN [name], DO       paliperidone (INVEGA) 24 hr tablet 6 mg  6 mg Oral QHS [name], DO   6 mg at 10/08/22 2114   traZODone (DESYREL) tablet 50 mg  50 mg Oral QHS PRN [name], DO   50 mg at 10/08/22 2114    Lab Results: No results found for this or any previous visit (from the past 48 hour(s)).  Blood Alcohol level:  Lab Results  Component Value Date   ETH <10 08/17/2022   ETH <10 01/11/2021    Metabolic Disorder Labs: No results found for: "HGBA1C", "MPG" No results found for: "PROLACTIN" No results found for: "CHOL", "TRIG", "HDL", "CHOLHDL", "VLDL", "LDLCALC"  Physical Findings: AIMS:  , ,  ,  ,    CIWA:    COWS:     Musculoskeletal: Strength & Muscle Tone: within normal limits Gait & Station: normal Patient leans: N/A  Psychiatric Specialty Exam:  Presentation  General Appearance:  Casual; Neat  Eye Contact: Fair  Speech: Slow; Slurred  Speech Volume: Decreased  Handedness: Right   Mood and Affect  Mood: Anxious  Affect: Inappropriate   Thought Process  Thought Processes: Disorganized  Descriptions of Associations:Loose  Orientation:Partial  Thought Content:Illogical; Rumination  History of Schizophrenia/Schizoaffective disorder:No data recorded Duration of Psychotic Symptoms:No data recorded Hallucinations:No data recorded Ideas of Reference:Paranoia  Suicidal Thoughts:No data recorded Homicidal Thoughts:No data recorded  Sensorium  Memory: Immediate Fair; Remote Poor  Judgment: Poor  Insight: Poor   Executive  Functions  Concentration: Poor  Attention Span: Fair  Recall: [employer] of Knowledge: Fair  Language: Fair   Psychomotor Activity  Psychomotor Activity:No data recorded  Assets  Assets: Communication Skills   Sleep  Sleep:No data recorded    Blood pressure (!) 108/57, pulse 69, temperature (!) 97.2 F (36.2 C), resp. rate 15, height 5\' 6"  (1.676 m), weight 64 kg, SpO2 100%. Body mass index is 22.76 kg/m.   Treatment Plan Summary: Daily contact with patient to assess and evaluate symptoms and progress in treatment, Medication management, and Plan continue current  medications.  [name], DO 10/09/2022, 12:05 PM

## 2022-10-10 DIAGNOSIS — F250 Schizoaffective disorder, bipolar type: Secondary | ICD-10-CM | POA: Diagnosis not present

## 2022-10-10 NOTE — Progress Notes (Signed)
[facility] MD Progress Note  10/10/2022 11:39 AM Betty Henderson  MRN:  [id number] Subjective: Betty Henderson is seen on rounds.  She continues to be pleasant and cooperative and compliant with her medications.  Nurses report no issues.  Social work informs me that we are still waiting for the state to help with placement.  She is still delusional that she can go home but does not understand that she does not have a home.  Sleep and appetite are good.  Principal Problem: Schizoaffective disorder, bipolar type (HCC) Diagnosis: Principal Problem:   Schizoaffective disorder, bipolar type (HCC)  Total Time spent with patient: 15 minutes  Past Psychiatric History: Long history of schizoaffective disorder, bipolar type and homelessness.  Past Medical History:  Past Medical History:  Diagnosis Date   Anemia    Arthritis    Chronic pain    Drug-seeking behavior    Malingering    Osteopetrosis    Psychosis (HCC)    Schizoaffective disorder, bipolar type (HCC)     Past Surgical History:  Procedure Laterality Date   MOUTH SURGERY     TUBAL LIGATION     Family History:  Family History  Family history unknown: Yes   Family Psychiatric  History: Unremarkable Social History:  Social History   Substance and Sexual Activity  Alcohol Use Not Currently   Comment: 1 cocktail 3 weeks ago     Social History   Substance and Sexual Activity  Drug Use Not Currently   Types: Cocaine   Comment: states "it's legal though"    Social History   Socioeconomic History   Marital status: Single    Spouse name: Not on file   Number of children: Not on file   Years of education: Not on file   Highest education level: Not on file  Occupational History   Not on file  Tobacco Use   Smoking status: Every Day    Current packs/day: 0.50    Types: Cigarettes   Smokeless tobacco: Never  Vaping Use   Vaping status: Never Used  Substance and Sexual Activity   Alcohol use: Not Currently    Comment: 1 cocktail  3 weeks ago   Drug use: Not Currently    Types: Cocaine    Comment: states "it's legal though"   Sexual activity: Never  Other Topics Concern   Not on file  Social History Narrative   Not on file   Social Determinants of Health   Financial Resource Strain: Not on file  Food Insecurity: Food Insecurity Present (08/22/2022)   Hunger Vital Sign    Worried About Running Out of Food in the Last Year: Sometimes true    Ran Out of Food in the Last Year: Sometimes true  Transportation Needs: Patient Unable To Answer (08/22/2022)   PRAPARE - Transportation    Lack of Transportation (Medical): Patient unable to answer    Lack of Transportation (Non-Medical): Patient unable to answer  Recent Concern: Transportation Needs - Unmet Transportation Needs (06/13/2022)   Received from [facility], [facility]   [facility] - Transportation    Lack of Transportation (Medical): Not on file    Lack of Transportation (Non-Medical): Yes  Physical Activity: Not on file  Stress: Not on file  Social Connections: Unknown (08/06/2021)   Received from [facility], [facility]   Social Network    Social Network: Not on file   Additional Social History:  Sleep: Good  Appetite:  Good  Current Medications: Current Facility-Administered Medications  Medication Dose Route Frequency Provider Last Rate Last Admin   acetaminophen (TYLENOL) tablet 650 mg  650 mg Oral Q6H PRN [name], DO   650 mg at 10/05/22 1813   alum & mag hydroxide-simeth (MAALOX/MYLANTA) 200-200-20 MG/5ML suspension 30 mL  30 mL Oral Q4H PRN [name], DO   30 mL at 09/20/22 1142   diphenhydrAMINE (BENADRYL) capsule 50 mg  50 mg Oral Q6H PRN [name], DO   50 mg at 09/29/22 2254   Or   diphenhydrAMINE (BENADRYL) injection 50 mg  50 mg Intramuscular Q6H PRN [name], DO       haloperidol (HALDOL) tablet 5 mg  5 mg Oral Q6H PRN [name], DO   5 mg at 09/28/22 2149   Or   haloperidol lactate (HALDOL) injection 5 mg  5 mg Intramuscular Q6H PRN [name], DO       ibuprofen (ADVIL) tablet 600 mg  600 mg Oral Q6H PRN [name], [name], MD   600 mg at 10/08/22 2134   LORazepam (ATIVAN) tablet 1 mg  1 mg Oral TID PRN [name], DO   1 mg at 09/27/22 0025   magnesium hydroxide (MILK OF MAGNESIA) suspension 30 mL  30 mL Oral Daily PRN [name], DO       paliperidone (INVEGA) 24 hr tablet 6 mg  6 mg Oral QHS [name], DO   6 mg at 10/09/22 2122   traZODone (DESYREL) tablet 50 mg  50 mg Oral QHS PRN [name], DO   50 mg at 10/09/22 2122    Lab Results: No results found for this or any previous visit (from the past 48 hour(s)).  Blood Alcohol level:  Lab Results  Component Value Date   ETH <10 08/17/2022   ETH <10 01/11/2021    Metabolic Disorder Labs: No results found for: "HGBA1C", "MPG" No results found for: "PROLACTIN" No results found for: "CHOL", "TRIG", "HDL", "CHOLHDL", "VLDL", "LDLCALC"  Physical Findings: AIMS:  , ,  ,  ,    CIWA:    COWS:     Musculoskeletal: Strength & Muscle Tone: within normal limits Gait & Station: normal Patient leans: N/A  Psychiatric Specialty Exam:  Presentation  General Appearance:  Casual; Neat  Eye Contact: Fair  Speech: Slow; Slurred  Speech Volume: Decreased  Handedness: Right   Mood and Affect  Mood: Anxious  Affect: Inappropriate   Thought Process  Thought Processes: Disorganized  Descriptions of Associations:Loose  Orientation:Partial  Thought Content:Illogical; Rumination  History of Schizophrenia/Schizoaffective disorder:No data recorded Duration of Psychotic Symptoms:No data recorded Hallucinations:No data recorded Ideas of Reference:Paranoia  Suicidal Thoughts:No data recorded Homicidal Thoughts:No data recorded  Sensorium  Memory: Immediate Fair; Remote  Poor  Judgment: Poor  Insight: Poor   Executive Functions  Concentration: Poor  Attention Span: Fair  Recall: [employer] of Knowledge: Fair  Language: Fair   Psychomotor Activity  Psychomotor Activity:No data recorded  Assets  Assets: Communication Skills   Sleep  Sleep:No data recorded    Blood pressure 122/67, pulse 80, temperature (!) 97.5 F (36.4 C), resp. rate 18, height 5\' 6"  (1.676 m), weight 64 kg, SpO2 99%. Body mass index is 22.76 kg/m.   Treatment Plan Summary: Daily contact with patient to assess and evaluate symptoms and progress in treatment, Medication management, and Plan continue current medications.  [name], DO 10/10/2022, 11:39 AM

## 2022-10-10 NOTE — [facility] Counselor (Signed)
CSW's [employer] and [employer] met with Company secretary regarding [employer] application.   Pt attended meeting and signed paperwork regarding medicaid application.   Wilhemena Durie to update if further assistance needed.   Reynaldo Minium, MSW, [place] 10/10/2022 3:56 PM

## 2022-10-10 NOTE — Group Note (Signed)
Recreation Therapy Group Note   Group Topic:Communication  Group Date: 10/10/2022 Start Time: 1400 End Time: 1450 Facilitators: Rosina Lowenstein, LRT, CTRS Location:  Dayroom  Group Description: Reminisce Cards. Patients drew a laminated card out of a bag that had a word or phrase on it. Pt encouraged to speak about a time in their life or fond memory that specifically relates to the word they chose out of the bag. An example would be: "parenthood, meals, siblings, travel, or home".  LRT prompted following questions and encouraged contribution from peers to increase communication.   Goal Area(s) Addressed: Patient will increase verbal communication by conversing with peers. Patient will contribute to group discussion with minimal prompting. Patient will reminisce a positive memory or moment in their life.   Affect/Mood: N/A   Participation Level: Did not attend    Clinical Observations/Individualized Feedback: Railee did not attend group.  Plan: Continue to engage patient in RT group sessions 2-3x/week.   Rosina Lowenstein, LRT, CTRS 10/10/2022 3:17 PM

## 2022-10-10 NOTE — Plan of Care (Signed)
  Problem: Nutrition: Goal: Adequate nutrition will be maintained Outcome: Progressing   Problem: Coping: Goal: Level of anxiety will decrease Outcome: Progressing   Problem: Pain Managment: Goal: General experience of comfort will improve Outcome: Progressing   

## 2022-10-10 NOTE — Group Note (Signed)
Date:  10/10/2022 Time:  8:41 PM  Group Topic/Focus:  Self Care:   The focus of this group is to help patients understand the importance of self-care in order to improve or restore emotional, physical, spiritual, interpersonal, and financial health.    Participation Level:  Minimal  Participation Quality:  Attentive  Affect:  Appropriate  Cognitive:  Appropriate  Insight: Lacking  Engagement in Group:  Improving  Modes of Intervention:  Discussion  Additional Comments:  Adult Unit Workbook pg.57-60 topic of stress management  Roberto Scales 10/10/2022, 8:41 PM

## 2022-10-10 NOTE — Progress Notes (Signed)
Patient was admitted on Aug 22, 2022 for worsening psychosis with a dx of schizoaffective disorder. She denies SI/HI/AVH. She denies anxiety and depression. Denies pain. Patient is now a voluntary admission.  No complaints voiced. No new orders. Discharge planning ongoing.  Q15 minute unit checks in place.

## 2022-10-10 NOTE — Progress Notes (Signed)
   10/10/22 0100  Psych Admission Type (Psych Patients Only)  Admission Status Voluntary  Psychosocial Assessment  Patient Complaints None  Eye Contact Fair  Facial Expression Flat  Affect Flat  Speech Slurred  Interaction Poor  Motor Activity Slow  Appearance/Hygiene Improved  Behavior Characteristics Cooperative;Calm  Mood Pleasant  Thought Process  Coherency Disorganized  Content Preoccupation  Delusions None reported or observed  Perception Hallucinations  Hallucination Auditory;Visual  Judgment Impaired  Confusion Mild  Danger to Self  Current suicidal ideation? Denies  Agreement Not to Harm Self Yes  Danger to Others  Danger to Others None reported or observed  Danger to Others Abnormal  Harmful Behavior to others No threats or harm toward other people  Destructive Behavior No threats or harm toward property

## 2022-10-10 NOTE — Plan of Care (Signed)
  Problem: Nutrition: Goal: Adequate nutrition will be maintained Outcome: Progressing   Problem: Safety: Goal: Ability to remain free from injury will improve Outcome: Progressing   Problem: Skin Integrity: Goal: Risk for impaired skin integrity will decrease Outcome: Progressing   Problem: Education: Goal: Knowledge of General Education information will improve Description: Including pain rating scale, medication(s)/side effects and non-pharmacologic comfort measures Outcome: Progressing   Problem: Health Behavior/Discharge Planning: Goal: Ability to manage health-related needs will improve Outcome: Progressing

## 2022-10-10 NOTE — Progress Notes (Signed)
   10/10/22 2000  Psych Admission Type (Psych Patients Only)  Admission Status Voluntary  Psychosocial Assessment  Patient Complaints None  Eye Contact Fair  Facial Expression Flat  Affect Flat  Speech Slurred  Interaction Minimal  Motor Activity Slow  Appearance/Hygiene Layered clothes  Behavior Characteristics Appropriate to situation;Cooperative  Mood Preoccupied  Thought Process  Coherency Disorganized  Content Preoccupation  Delusions None reported or observed  Perception Hallucinations  Hallucination Auditory;Visual  Judgment Impaired  Confusion Mild  Danger to Self  Current suicidal ideation? Denies  Agreement Not to Harm Self Yes  Description of Agreement verbal  Danger to Others  Danger to Others None reported or observed  Danger to Others Abnormal  Harmful Behavior to others No threats or harm toward other people  Destructive Behavior No threats or harm toward property

## 2022-10-11 DIAGNOSIS — F250 Schizoaffective disorder, bipolar type: Secondary | ICD-10-CM | POA: Diagnosis not present

## 2022-10-11 NOTE — Progress Notes (Signed)
   10/11/22 0800  Psych Admission Type (Psych Patients Only)  Admission Status Voluntary  Psychosocial Assessment  Patient Complaints None  Eye Contact Fair  Facial Expression Sullen  Affect Flat  Speech Slurred  Interaction Minimal  Motor Activity Slow  Appearance/Hygiene Bizarre;Layered clothes  Behavior Characteristics Cooperative  Mood Pleasant  Thought Process  Coherency Disorganized  Content Preoccupation  Delusions None reported or observed  Perception Hallucinations  Hallucination Auditory;Visual  Judgment Impaired  Confusion Mild  Danger to Self  Current suicidal ideation? Denies  Danger to Others  Danger to Others None reported or observed

## 2022-10-11 NOTE — Progress Notes (Signed)
[facility] MD Progress Note  10/11/2022 1:15 PM Betty Henderson  MRN:  [id number] Subjective:  Avi is stable today. No complaints no issues. Principal Problem: Schizoaffective disorder, bipolar type (HCC) Diagnosis: Principal Problem:   Schizoaffective disorder, bipolar type (HCC)  Total Time spent with patient: 15 minutes  Past Psychiatric History: Schizoaffective Disorder  Past Medical History:  Past Medical History:  Diagnosis Date   Anemia    Arthritis    Chronic pain    Drug-seeking behavior    Malingering    Osteopetrosis    Psychosis (HCC)    Schizoaffective disorder, bipolar type (HCC)     Past Surgical History:  Procedure Laterality Date   MOUTH SURGERY     TUBAL LIGATION     Family History:  Family History  Family history unknown: Yes   Family Psychiatric  History: Unremarkable Social History:  Social History   Substance and Sexual Activity  Alcohol Use Not Currently   Comment: 1 cocktail 3 weeks ago     Social History   Substance and Sexual Activity  Drug Use Not Currently   Types: Cocaine   Comment: states "it's legal though"    Social History   Socioeconomic History   Marital status: Single    Spouse name: Not on file   Number of children: Not on file   Years of education: Not on file   Highest education level: Not on file  Occupational History   Not on file  Tobacco Use   Smoking status: Every Day    Current packs/day: 0.50    Types: Cigarettes   Smokeless tobacco: Never  Vaping Use   Vaping status: Never Used  Substance and Sexual Activity   Alcohol use: Not Currently    Comment: 1 cocktail 3 weeks ago   Drug use: Not Currently    Types: Cocaine    Comment: states "it's legal though"   Sexual activity: Never  Other Topics Concern   Not on file  Social History Narrative   Not on file   Social Determinants of Health   Financial Resource Strain: Not on file  Food Insecurity: Food Insecurity Present (08/22/2022)   Hunger Vital  Sign    Worried About Running Out of Food in the Last Year: Sometimes true    Ran Out of Food in the Last Year: Sometimes true  Transportation Needs: Patient Unable To Answer (08/22/2022)   PRAPARE - Transportation    Lack of Transportation (Medical): Patient unable to answer    Lack of Transportation (Non-Medical): Patient unable to answer  Recent Concern: Transportation Needs - Unmet Transportation Needs (06/13/2022)   Received from [facility], [facility]   [facility] - Transportation    Lack of Transportation (Medical): Not on file    Lack of Transportation (Non-Medical): Yes  Physical Activity: Not on file  Stress: Not on file  Social Connections: Unknown (08/06/2021)   Received from [facility], [facility]   Social Network    Social Network: Not on file   Additional Social History:                         Sleep: Good  Appetite:  Good  Current Medications: Current Facility-Administered Medications  Medication Dose Route Frequency Provider Last Rate Last Admin   acetaminophen (TYLENOL) tablet 650 mg  650 mg Oral Q6H PRN [name], DO   650 mg at 10/05/22 1813   alum & mag hydroxide-simeth (MAALOX/MYLANTA) 200-200-20 MG/5ML  suspension 30 mL  30 mL Oral Q4H PRN [name], DO   30 mL at 09/20/22 1142   diphenhydrAMINE (BENADRYL) capsule 50 mg  50 mg Oral Q6H PRN [name], DO   50 mg at 09/29/22 2254   Or   diphenhydrAMINE (BENADRYL) injection 50 mg  50 mg Intramuscular Q6H PRN [name], DO       haloperidol (HALDOL) tablet 5 mg  5 mg Oral Q6H PRN [name], DO   5 mg at 09/28/22 2149   Or   haloperidol lactate (HALDOL) injection 5 mg  5 mg Intramuscular Q6H PRN [name], DO       ibuprofen (ADVIL) tablet 600 mg  600 mg Oral Q6H PRN [name], [name], MD   600 mg at 10/08/22 2134   LORazepam (ATIVAN) tablet 1 mg  1 mg Oral TID PRN [name], DO   1 mg at 09/27/22 0025    magnesium hydroxide (MILK OF MAGNESIA) suspension 30 mL  30 mL Oral Daily PRN [name], DO       paliperidone (INVEGA) 24 hr tablet 6 mg  6 mg Oral QHS [name], DO   6 mg at 10/10/22 2123   traZODone (DESYREL) tablet 50 mg  50 mg Oral QHS PRN [name], DO   50 mg at 10/10/22 2123    Lab Results: No results found for this or any previous visit (from the past 48 hour(s)).  Blood Alcohol level:  Lab Results  Component Value Date   ETH <10 08/17/2022   ETH <10 01/11/2021    Metabolic Disorder Labs: No results found for: "HGBA1C", "MPG" No results found for: "PROLACTIN" No results found for: "CHOL", "TRIG", "HDL", "CHOLHDL", "VLDL", "LDLCALC"  Physical Findings: AIMS:  , ,  ,  ,    CIWA:    COWS:     Musculoskeletal: Strength & Muscle Tone: within normal limits Gait & Station: normal Patient leans: N/A  Psychiatric Specialty Exam:  Presentation  General Appearance:  Casual; Neat  Eye Contact: Fair  Speech: Slow; Slurred  Speech Volume: Decreased  Handedness: Right   Mood and Affect  Mood: Anxious  Affect: Inappropriate   Thought Process  Thought Processes: Disorganized  Descriptions of Associations:Loose  Orientation:Partial  Thought Content:Illogical; Rumination  History of Schizophrenia/Schizoaffective disorder:No data recorded Duration of Psychotic Symptoms:No data recorded Hallucinations:No data recorded Ideas of Reference:Paranoia  Suicidal Thoughts:No data recorded Homicidal Thoughts:No data recorded  Sensorium  Memory: Immediate Fair; Remote Poor  Judgment: Poor  Insight: Poor   Executive Functions  Concentration: Poor  Attention Span: Fair  Recall: [employer] of Knowledge: Fair  Language: Fair   Psychomotor Activity  Psychomotor Activity:No data recorded  Assets  Assets: Communication Skills   Sleep  Sleep:No data recorded   Blood pressure 107/74, pulse  79, temperature 98.7 F (37.1 C), temperature source Oral, resp. rate (!) 22, height 5\' 6"  (1.676 m), weight 64 kg, SpO2 100%. Body mass index is 22.76 kg/m.   Treatment Plan Summary: Daily contact with patient to assess and evaluate symptoms and progress in treatment, Medication management, and Plan Continue current meds.  Reiko Vinje Tresea Mall, DO 10/11/2022, 1:15 PM

## 2022-10-11 NOTE — Plan of Care (Signed)
  Problem: Education: Goal: Knowledge of General Education information will improve Description: Including pain rating scale, medication(s)/side effects and non-pharmacologic comfort measures Outcome: Progressing   Problem: Health Behavior/Discharge Planning: Goal: Ability to manage health-related needs will improve Outcome: Progressing   Problem: Clinical Measurements: Goal: Ability to maintain clinical measurements within normal limits will improve Outcome: Progressing Goal: Diagnostic test results will improve Outcome: Progressing   Problem: Nutrition: Goal: Adequate nutrition will be maintained Outcome: Progressing   Problem: Coping: Goal: Level of anxiety will decrease Outcome: Progressing   Problem: Elimination: Goal: Will not experience complications related to bowel motility Outcome: Progressing Goal: Will not experience complications related to urinary retention Outcome: Progressing   Problem: Pain Managment: Goal: General experience of comfort will improve Outcome: Progressing   Problem: Safety: Goal: Ability to remain free from injury will improve Outcome: Progressing   Problem: Skin Integrity: Goal: Risk for impaired skin integrity will decrease Outcome: Progressing

## 2022-10-11 NOTE — Plan of Care (Signed)
  Problem: Education: Goal: Knowledge of General Education information will improve Description: Including pain rating scale, medication(s)/side effects and non-pharmacologic comfort measures Outcome: Progressing   Problem: Health Behavior/Discharge Planning: Goal: Ability to manage health-related needs will improve Outcome: Progressing   Problem: Clinical Measurements: Goal: Ability to maintain clinical measurements within normal limits will improve Outcome: Progressing Goal: Diagnostic test results will improve Outcome: Progressing   Problem: Nutrition: Goal: Adequate nutrition will be maintained Outcome: Progressing

## 2022-10-11 NOTE — Group Note (Signed)
  Date:  10/16/2022 Time:  1:32 AM  Group Topic/Focus:  Building Self Esteem:   The Focus of this group is helping patients become aware of the effects of self-esteem on their lives, the things they and others do that enhance or undermine their self-esteem, seeing the relationship between their level of self-esteem and the choices they make and learning ways to enhance self-esteem. Coping With Mental Health Crisis:   The purpose of this group is to help patients identify strategies for coping with mental health crisis.  Group discusses possible causes of crisis and ways to manage them effectively. Developing a Wellness Toolbox:   The focus of this group is to help patients develop a "wellness toolbox" with skills and strategies to promote recovery upon discharge. Emotional Education:   The focus of this group is to discuss what feelings/emotions are, and how they are experienced. Goals Group:   The focus of this group is to help patients establish daily goals to achieve during treatment and discuss how the patient can incorporate goal setting into their daily lives to aide in recovery. Healthy Communication:   The focus of this group is to discuss communication, barriers to communication, as well as healthy ways to communicate with others. Identifying Needs:   The focus of this group is to help patients identify their personal needs that have been historically problematic and identify healthy behaviors to address their needs. Making Healthy Choices:   The focus of this group is to help patients identify negative/unhealthy choices they were using prior to admission and identify positive/healthier coping strategies to replace them upon discharge. Managing Feelings:   The focus of this group is to identify what feelings patients have difficulty handling and develop a plan to handle them in a healthier way upon discharge. Overcoming Stress:   The focus of this group is to define stress and help patients  assess their triggers. Personal Choices and Values:   The focus of this group is to help patients assess and explore the importance of values in their lives, how their values affect their decisions, how they express their values and what opposes their expression.    Participation Level:  Active  Participation Quality:  Appropriate  Affect:  Appropriate  Cognitive:  Alert  Insight: Appropriate  Engagement in Group:  Engaged  Modes of Intervention:  Discussion  Additional Comments:    Betty Henderson 10/16/2022, 1:32 AM

## 2022-10-11 NOTE — Group Note (Signed)
Recreation Therapy Group Note   Group Topic:Leisure Education  Group Date: 10/11/2022 Start Time: 1400 End Time: 1500 Facilitators: Rosina Lowenstein, LRT, CTRS Location:  Dayroom  Group Description: Leisure. Patients were given the option to choose from coloring, singing karaoke, journaling or listening to music. LRT and pts discussed the meaning of leisure, the importance of participating in healthy leisure during their free time/when they're outside of the hospital, as well as how our leisure interests can also serve as coping skills. Pt identified two leisure interests and shared with the group.    Goal Area(s) Addressed:  Patient will identify a current leisure interest.  Patient will learn the definition of "leisure". Patient will practice making a positive decision. Patient will have the opportunity to try a new leisure activity. Patient will communicate with peers and LRT.    Affect/Mood: Appropriate   Participation Level: Active and Engaged   Participation Quality: Independent   Behavior: Appropriate, Calm, and Cooperative   Speech/Thought Process: Coherent   Insight: Good   Judgement: Good   Modes of Intervention: Activity   Patient Response to Interventions:  Attentive, Engaged, Interested , and Receptive   Education Outcome:  Acknowledges education   Clinical Observations/Individualized Feedback: Shandrika was active in their participation of session activities and group discussion. Pt identified "watch tv and eat" as things she does in her free time. Pt chose to sing karaoke while in group. Pt interacted well with LRT and peers duration of session.   Plan: Continue to engage patient in RT group sessions 2-3x/week.   Rosina Lowenstein, LRT, CTRS 10/11/2022 3:25 PM

## 2022-10-12 DIAGNOSIS — F250 Schizoaffective disorder, bipolar type: Secondary | ICD-10-CM | POA: Diagnosis not present

## 2022-10-12 NOTE — BH IP Treatment Plan (Signed)
Interdisciplinary Treatment and Diagnostic Plan Update  10/12/2022 Time of Session: 12:57 pm Aprille Sawhney MRN: [id number]  Principal Diagnosis: Schizoaffective disorder, bipolar type [facility])  Secondary Diagnoses: Principal Problem:   Schizoaffective disorder, bipolar type (HCC)   Current Medications:  Current Facility-Administered Medications  Medication Dose Route Frequency Provider Last Rate Last Admin   acetaminophen (TYLENOL) tablet 650 mg  650 mg Oral Q6H PRN [name], DO   650 mg at 10/11/22 2139   alum & mag hydroxide-simeth (MAALOX/MYLANTA) 200-200-20 MG/5ML suspension 30 mL  30 mL Oral Q4H PRN [name], DO   30 mL at 09/20/22 1142   diphenhydrAMINE (BENADRYL) capsule 50 mg  50 mg Oral Q6H PRN [name], DO   50 mg at 09/29/22 2254   Or   diphenhydrAMINE (BENADRYL) injection 50 mg  50 mg Intramuscular Q6H PRN [name], DO       haloperidol (HALDOL) tablet 5 mg  5 mg Oral Q6H PRN [name], DO   5 mg at 09/28/22 2149   Or   haloperidol lactate (HALDOL) injection 5 mg  5 mg Intramuscular Q6H PRN [name], DO       ibuprofen (ADVIL) tablet 600 mg  600 mg Oral Q6H PRN [name], [name], MD   600 mg at 10/08/22 2134   LORazepam (ATIVAN) tablet 1 mg  1 mg Oral TID PRN [name], DO   1 mg at 09/27/22 0025   magnesium hydroxide (MILK OF MAGNESIA) suspension 30 mL  30 mL Oral Daily PRN [name], DO       paliperidone (INVEGA) 24 hr tablet 6 mg  6 mg Oral QHS [name], DO   6 mg at 10/11/22 2137   traZODone (DESYREL) tablet 50 mg  50 mg Oral QHS PRN [name], DO   50 mg at 10/11/22 2138   PTA Medications: Medications Prior to Admission  Medication Sig Dispense Refill Last Dose   ondansetron (ZOFRAN) 4 MG tablet Take 1 tablet (4 mg total) by mouth every 8 (eight) hours as needed for nausea or vomiting. (Patient not taking: Reported on  08/17/2022) 20 tablet 0     Patient Stressors: Financial difficulties   Health problems   Medication change or noncompliance    Patient Strengths: Ability for insight   Treatment Modalities: Medication Management, Group therapy, Case management,  1 to 1 session with clinician, Psychoeducation, Recreational therapy.   Physician Treatment Plan for Primary Diagnosis: Schizoaffective disorder, bipolar type (HCC) Long Term Goal(s): Improvement in symptoms so as ready for discharge   Short Term Goals: Ability to identify changes in lifestyle to reduce recurrence of condition will improve Ability to verbalize feelings will improve Ability to disclose and discuss suicidal ideas Ability to demonstrate self-control will improve Ability to identify and develop effective coping behaviors will improve Ability to maintain clinical measurements within normal limits will improve Compliance with prescribed medications will improve Ability to identify triggers associated with substance abuse/mental health issues will improve  Medication Management: Evaluate patient's response, side effects, and tolerance of medication regimen.  Therapeutic Interventions: 1 to 1 sessions, Unit Group sessions and Medication administration.  Evaluation of Outcomes: Progressing  Physician Treatment Plan for Secondary Diagnosis: Principal Problem:   Schizoaffective disorder, bipolar type (HCC)  Long Term Goal(s): Improvement in symptoms so as ready for discharge   Short Term Goals: Ability to identify changes in lifestyle to reduce recurrence of condition will improve Ability  to verbalize feelings will improve Ability to disclose and discuss suicidal ideas Ability to demonstrate self-control will improve Ability to identify and develop effective coping behaviors will improve Ability to maintain clinical measurements within normal limits will improve Compliance with prescribed medications will improve Ability to  identify triggers associated with substance abuse/mental health issues will improve     Medication Management: Evaluate patient's response, side effects, and tolerance of medication regimen.  Therapeutic Interventions: 1 to 1 sessions, Unit Group sessions and Medication administration.  Evaluation of Outcomes: Progressing   RN Treatment Plan for Primary Diagnosis: Schizoaffective disorder, bipolar type (HCC) Long Term Goal(s): Knowledge of disease and therapeutic regimen to maintain health will improve  Short Term Goals: Ability to remain free from injury will improve, Ability to verbalize frustration and anger appropriately will improve, Ability to demonstrate self-control, Ability to participate in decision making will improve, Ability to verbalize feelings will improve, Ability to disclose and discuss suicidal ideas, Ability to identify and develop effective coping behaviors will improve, and Compliance with prescribed medications will improve  Medication Management: RN will administer medications as ordered by provider, will assess and evaluate patient's response and provide education to patient for prescribed medication. RN will report any adverse and/or side effects to prescribing provider.  Therapeutic Interventions: 1 on 1 counseling sessions, Psychoeducation, Medication administration, Evaluate responses to treatment, Monitor vital signs and CBGs as ordered, Perform/monitor CIWA, COWS, AIMS and Fall Risk screenings as ordered, Perform wound care treatments as ordered.  Evaluation of Outcomes: Progressing   LCSW Treatment Plan for Primary Diagnosis: Schizoaffective disorder, bipolar type (HCC) Long Term Goal(s): Safe transition to appropriate next level of care at discharge, Engage patient in therapeutic group addressing interpersonal concerns.  Short Term Goals: Engage patient in aftercare planning with referrals and resources, Increase social support, Increase ability to  appropriately verbalize feelings, Increase emotional regulation, Facilitate acceptance of mental health diagnosis and concerns, Facilitate patient progression through stages of change regarding substance use diagnoses and concerns, and Increase skills for wellness and recovery  Therapeutic Interventions: Assess for all discharge needs, 1 to 1 time with Social worker, Explore available resources and support systems, Assess for adequacy in community support network, Educate family and significant other(s) on suicide prevention, Complete Psychosocial Assessment, Interpersonal group therapy.  Evaluation of Outcomes: Progressing   Progress in Treatment: Attending groups: Yes. Participating in groups: No. Taking medication as prescribed: Yes. Toleration medication: Yes. Family/Significant other contact made: Yes, individual(s) contacted:  Selita Staiger, son, [phone] Patient understands diagnosis: No. Discussing patient identified problems/goals with staff: Yes. Medical problems stabilized or resolved: Yes. Denies suicidal/homicidal ideation: Yes. Issues/concerns per patient self-inventory: No. Other: none  New problem(s) identified: No, Describe:  none  New Short Term/Long Term Goal(s): Update 6/30: none at this time. Update 09/27/2022:  No changes at this time.  Update 10/02/2022:  No changes at this time. Update 10/07/22: No changes at this time 10/12/22: No changes at this time      Patient Goals:  Update 6/30: none at this time. Update 09/27/2022:  No changes at this time. Update 10/02/2022:  No changes at this time. Update 10/07/22: No changes at this time 10/12/22: No changes at this time     Discharge Plan or Barriers: Update 6/30: APS report has been made and patient is being investigated for guardianship needs.  Remains homeless with limited supports. Update 09/27/2022:  No changes at this time.  Update 10/02/2022:  Patient remains safe on the unit at this time.  Patient  remains psychotic at  this time.  APS report has been made, however, no follow up from the caseworker on her case.  No safe discharge identified.  CSW has requested that application for Medicaid and disability be completed.   Update 10/07/22: No changes at this time 10/12/22: No changes at this time   Reason for Continuation of Hospitalization: Hallucinations Mania Medication stabilization  Estimated Length of Stay:  Update 6/30: 1-7 days Update 09/27/2022:  TBD Update 10/02/2022:  No changes at this time. Update 10/07/22: No changes at this time Update 10/12/22: No changes at this time   Last 3 [place] Suicide Severity Risk Score: Flowsheet Row Admission (Current) from 08/22/2022 in [facility] ED from 08/17/2022 in [facility] Emergency Department at [facility] ED from 08/12/2022 in [facility] Emergency Department at [facility]  C-SSRS RISK CATEGORY Low Risk No Risk No Risk       Last [facility] 2/9 Scores:     No data to display          Scribe for Treatment Team: Marshell Levan, [place] 10/12/2022 12:57 PM

## 2022-10-12 NOTE — Progress Notes (Signed)
   10/12/22 0700  Psych Admission Type (Psych Patients Only)  Admission Status Voluntary  Psychosocial Assessment  Patient Complaints None  Eye Contact Brief  Facial Expression Flat  Affect Flat  Speech Soft;Slurred  Interaction Minimal  Motor Activity Slow;Shuffling  Appearance/Hygiene Layered clothes  Behavior Characteristics Cooperative  Mood Pleasant  Thought Process  Coherency Disorganized  Content Paranoia;Religiosity  Delusions None reported or observed  Perception Hallucinations  Hallucination Auditory;Visual  Judgment Impaired  Confusion Mild  Danger to Self  Current suicidal ideation? Denies  Agreement Not to Harm Self Yes  Danger to Others  Danger to Others None reported or observed  Danger to Others Abnormal  Harmful Behavior to others No threats or harm toward other people  Destructive Behavior No threats or harm toward property   Patient isolated most of the shift with minimal interaction with peers and staff. Will only leave room for meals. Denies SI/HI/AVH.

## 2022-10-12 NOTE — Progress Notes (Signed)
   10/12/22 2100  Psych Admission Type (Psych Patients Only)  Admission Status Voluntary  Psychosocial Assessment  Patient Complaints None  Eye Contact Fair  Facial Expression Flat  Affect Flat  Speech Soft;Slurred  Interaction Minimal  Motor Activity Slow  Appearance/Hygiene Layered clothes  Behavior Characteristics Cooperative  Mood Pleasant  Thought Process  Coherency Disorganized  Content Preoccupation  Delusions None reported or observed  Perception Hallucinations  Hallucination Auditory;Visual  Judgment Impaired  Confusion Mild  Danger to Self  Current suicidal ideation? Denies  Agreement Not to Harm Self Yes  Description of Agreement verbal  Danger to Others  Danger to Others None reported or observed  Danger to Others Abnormal  Harmful Behavior to others No threats or harm toward other people  Destructive Behavior No threats or harm toward property

## 2022-10-12 NOTE — Progress Notes (Signed)
[facility] MD Progress Note  10/12/2022 10:56 AM Betty Henderson  MRN:  [id number] Subjective: Betty Henderson is seen on rounds.  She has been pleasant and cooperative.  Good controls on the unit.  She has been compliant with medications without any side effects.  Nurses report no issues.  She has no complaints.  She is eating and sleeping well. Principal Problem: Schizoaffective disorder, bipolar type (HCC) Diagnosis: Principal Problem:   Schizoaffective disorder, bipolar type (HCC)  Total Time spent with patient: 15 minutes  Past Psychiatric History: Schizoaffective disorder bipolar type  Past Medical History:  Past Medical History:  Diagnosis Date   Anemia    Arthritis    Chronic pain    Drug-seeking behavior    Malingering    Osteopetrosis    Psychosis (HCC)    Schizoaffective disorder, bipolar type (HCC)     Past Surgical History:  Procedure Laterality Date   MOUTH SURGERY     TUBAL LIGATION     Family History:  Family History  Family history unknown: Yes   Family Psychiatric  History: Unremarkable Social History:  Social History   Substance and Sexual Activity  Alcohol Use Not Currently   Comment: 1 cocktail 3 weeks ago     Social History   Substance and Sexual Activity  Drug Use Not Currently   Types: Cocaine   Comment: states "it's legal though"    Social History   Socioeconomic History   Marital status: Single    Spouse name: Not on file   Number of children: Not on file   Years of education: Not on file   Highest education level: Not on file  Occupational History   Not on file  Tobacco Use   Smoking status: Every Day    Current packs/day: 0.50    Types: Cigarettes   Smokeless tobacco: Never  Vaping Use   Vaping status: Never Used  Substance and Sexual Activity   Alcohol use: Not Currently    Comment: 1 cocktail 3 weeks ago   Drug use: Not Currently    Types: Cocaine    Comment: states "it's legal though"   Sexual activity: Never  Other Topics  Concern   Not on file  Social History Narrative   Not on file   Social Determinants of Health   Financial Resource Strain: Not on file  Food Insecurity: Food Insecurity Present (08/22/2022)   Hunger Vital Sign    Worried About Running Out of Food in the Last Year: Sometimes true    Ran Out of Food in the Last Year: Sometimes true  Transportation Needs: Patient Unable To Answer (08/22/2022)   PRAPARE - Transportation    Lack of Transportation (Medical): Patient unable to answer    Lack of Transportation (Non-Medical): Patient unable to answer  Recent Concern: Transportation Needs - Unmet Transportation Needs (06/13/2022)   Received from [facility], [facility]   [facility] - Transportation    Lack of Transportation (Medical): Not on file    Lack of Transportation (Non-Medical): Yes  Physical Activity: Not on file  Stress: Not on file  Social Connections: Unknown (08/06/2021)   Received from [facility], [facility]   Social Network    Social Network: Not on file   Additional Social History:                         Sleep: Good  Appetite:  Good  Current Medications: Current Facility-Administered Medications  Medication Dose Route  Frequency Provider Last Rate Last Admin   acetaminophen (TYLENOL) tablet 650 mg  650 mg Oral Q6H PRN [name], DO   650 mg at 10/11/22 2139   alum & mag hydroxide-simeth (MAALOX/MYLANTA) 200-200-20 MG/5ML suspension 30 mL  30 mL Oral Q4H PRN [name], DO   30 mL at 09/20/22 1142   diphenhydrAMINE (BENADRYL) capsule 50 mg  50 mg Oral Q6H PRN [name], DO   50 mg at 09/29/22 2254   Or   diphenhydrAMINE (BENADRYL) injection 50 mg  50 mg Intramuscular Q6H PRN [name], DO       haloperidol (HALDOL) tablet 5 mg  5 mg Oral Q6H PRN [name], DO   5 mg at 09/28/22 2149   Or   haloperidol lactate (HALDOL) injection 5 mg  5 mg Intramuscular Q6H PRN [name],  DO       ibuprofen (ADVIL) tablet 600 mg  600 mg Oral Q6H PRN [name], [name], MD   600 mg at 10/08/22 2134   LORazepam (ATIVAN) tablet 1 mg  1 mg Oral TID PRN [name], DO   1 mg at 09/27/22 0025   magnesium hydroxide (MILK OF MAGNESIA) suspension 30 mL  30 mL Oral Daily PRN [name], DO       paliperidone (INVEGA) 24 hr tablet 6 mg  6 mg Oral QHS [name], DO   6 mg at 10/11/22 2137   traZODone (DESYREL) tablet 50 mg  50 mg Oral QHS PRN [name], DO   50 mg at 10/11/22 2138    Lab Results: No results found for this or any previous visit (from the past 48 hour(s)).  Blood Alcohol level:  Lab Results  Component Value Date   ETH <10 08/17/2022   ETH <10 01/11/2021    Metabolic Disorder Labs: No results found for: "HGBA1C", "MPG" No results found for: "PROLACTIN" No results found for: "CHOL", "TRIG", "HDL", "CHOLHDL", "VLDL", "LDLCALC"  Physical Findings: AIMS:  , ,  ,  ,    CIWA:    COWS:     Musculoskeletal: Strength & Muscle Tone: within normal limits Gait & Station: normal Patient leans: N/A  Psychiatric Specialty Exam:  Presentation  General Appearance:  Casual; Neat  Eye Contact: Fair  Speech: Slow; Slurred  Speech Volume: Decreased  Handedness: Right   Mood and Affect  Mood: Anxious  Affect: Inappropriate   Thought Process  Thought Processes: Disorganized  Descriptions of Associations:Loose  Orientation:Partial  Thought Content:Illogical; Rumination  History of Schizophrenia/Schizoaffective disorder:No data recorded Duration of Psychotic Symptoms:No data recorded Hallucinations:No data recorded Ideas of Reference:Paranoia  Suicidal Thoughts:No data recorded Homicidal Thoughts:No data recorded  Sensorium  Memory: Immediate Fair; Remote Poor  Judgment: Poor  Insight: Poor   Executive Functions  Concentration: Poor  Attention Span: Fair  Recall: [employer] of  Knowledge: Fair  Language: Fair   Psychomotor Activity  Psychomotor Activity:No data recorded  Assets  Assets: Communication Skills   Sleep  Sleep:No data recorded    Blood pressure 118/66, pulse 67, temperature 98 F (36.7 C), resp. rate 18, height 5\' 6"  (1.676 m), weight 64 kg, SpO2 97%. Body mass index is 22.76 kg/m.   Treatment Plan Summary: Daily contact with patient to assess and evaluate symptoms and progress in treatment, Medication management, and Plan continue current medications.  [name], DO 10/12/2022, 10:56 AM

## 2022-10-12 NOTE — Progress Notes (Signed)
D: Pt alert and oriented. Pt rates depression 0/10 and anxiety 0/10.  Pt denies experiencing any SI/HI, or AVH at this time. Pt rates pain and soreness at 10/10. Tylenol administered.  A: Scheduled medications administered to pt, per MD orders. Support and encouragement provided. Frequent verbal contact made. Routine safety checks conducted q15 minutes.   R: No adverse drug reactions noted. Pt verbally contracts for safety at this time. Pt complaint with medications and treatment plan. Pt interacts well with others on the unit. Pt remains safe at this time. Will continue to monitor.

## 2022-10-12 NOTE — Group Note (Signed)
Date:  10/12/2022 Time:  9:20 PM  Group Topic/Focus:  Crisis Planning:   The purpose of this group is to help patients create a crisis plan for use upon discharge or in the future, as needed.    Participation Level:  Did Not Attend  Participation Quality:   Did Not Attend  Affect:   Did Not Attend  Cognitive:   Did Not Attend  Insight: None  Engagement in Group:  None  Modes of Intervention:   Did Not Attend  Additional Comments:    [name] 10/12/2022, 9:20 PM

## 2022-10-13 DIAGNOSIS — F250 Schizoaffective disorder, bipolar type: Secondary | ICD-10-CM | POA: Diagnosis not present

## 2022-10-13 NOTE — Progress Notes (Signed)
   10/13/22 0705  Psych Admission Type (Psych Patients Only)  Admission Status Voluntary  Psychosocial Assessment  Patient Complaints None  Eye Contact Fair  Facial Expression Animated  Affect Blunted  Speech Slurred  Interaction Minimal  Motor Activity Shuffling  Appearance/Hygiene Layered clothes  Behavior Characteristics Cooperative  Mood Pleasant  Thought Process  Coherency Disorganized  Content Preoccupation  Delusions None reported or observed  Perception Hallucinations  Hallucination Visual;Auditory  Judgment Impaired  Confusion Mild  Danger to Self  Current suicidal ideation? Denies  Danger to Others  Danger to Others None reported or observed

## 2022-10-13 NOTE — Progress Notes (Signed)
[facility] MD Progress Note  10/13/2022 12:17 PM Alyscia Carmon  MRN:  [id number] Subjective: Betty Henderson is seen on rounds.  She is still waiting for DSS placement.  Nurses report no problems.  She has been in good controls on the unit.  She has been compliant with medications and denies any side effects.  She denies any suicidal ideation. Principal Problem: Schizoaffective disorder, bipolar type (HCC) Diagnosis: Principal Problem:   Schizoaffective disorder, bipolar type (HCC)  Total Time spent with patient: 15 minutes  Past Psychiatric History: Schizoaffective disorder, bipolar type.  Past Medical History:  Past Medical History:  Diagnosis Date   Anemia    Arthritis    Chronic pain    Drug-seeking behavior    Malingering    Osteopetrosis    Psychosis (HCC)    Schizoaffective disorder, bipolar type (HCC)     Past Surgical History:  Procedure Laterality Date   MOUTH SURGERY     TUBAL LIGATION     Family History:  Family History  Family history unknown: Yes   Family Psychiatric  History: Unremarkable Social History:  Social History   Substance and Sexual Activity  Alcohol Use Not Currently   Comment: 1 cocktail 3 weeks ago     Social History   Substance and Sexual Activity  Drug Use Not Currently   Types: Cocaine   Comment: states "it's legal though"    Social History   Socioeconomic History   Marital status: Single    Spouse name: Not on file   Number of children: Not on file   Years of education: Not on file   Highest education level: Not on file  Occupational History   Not on file  Tobacco Use   Smoking status: Every Day    Current packs/day: 0.50    Types: Cigarettes   Smokeless tobacco: Never  Vaping Use   Vaping status: Never Used  Substance and Sexual Activity   Alcohol use: Not Currently    Comment: 1 cocktail 3 weeks ago   Drug use: Not Currently    Types: Cocaine    Comment: states "it's legal though"   Sexual activity: Never  Other Topics  Concern   Not on file  Social History Narrative   Not on file   Social Determinants of Health   Financial Resource Strain: Not on file  Food Insecurity: Food Insecurity Present (08/22/2022)   Hunger Vital Sign    Worried About Running Out of Food in the Last Year: Sometimes true    Ran Out of Food in the Last Year: Sometimes true  Transportation Needs: Patient Unable To Answer (08/22/2022)   PRAPARE - Transportation    Lack of Transportation (Medical): Patient unable to answer    Lack of Transportation (Non-Medical): Patient unable to answer  Recent Concern: Transportation Needs - Unmet Transportation Needs (06/13/2022)   Received from [facility], [facility]   [facility] - Transportation    Lack of Transportation (Medical): Not on file    Lack of Transportation (Non-Medical): Yes  Physical Activity: Not on file  Stress: Not on file  Social Connections: Unknown (08/06/2021)   Received from [facility], [facility]   Social Network    Social Network: Not on file   Additional Social History:                         Sleep: Good  Appetite:  Good  Current Medications: Current Facility-Administered Medications  Medication Dose Route  Frequency Provider Last Rate Last Admin   acetaminophen (TYLENOL) tablet 650 mg  650 mg Oral Q6H PRN [name], DO   650 mg at 10/11/22 2139   alum & mag hydroxide-simeth (MAALOX/MYLANTA) 200-200-20 MG/5ML suspension 30 mL  30 mL Oral Q4H PRN [name], DO   30 mL at 09/20/22 1142   diphenhydrAMINE (BENADRYL) capsule 50 mg  50 mg Oral Q6H PRN [name], DO   50 mg at 09/29/22 2254   Or   diphenhydrAMINE (BENADRYL) injection 50 mg  50 mg Intramuscular Q6H PRN [name], DO       haloperidol (HALDOL) tablet 5 mg  5 mg Oral Q6H PRN [name], DO   5 mg at 09/28/22 2149   Or   haloperidol lactate (HALDOL) injection 5 mg  5 mg Intramuscular Q6H PRN [name],  DO       ibuprofen (ADVIL) tablet 600 mg  600 mg Oral Q6H PRN [name], [name], MD   600 mg at 10/08/22 2134   LORazepam (ATIVAN) tablet 1 mg  1 mg Oral TID PRN [name], DO   1 mg at 09/27/22 0025   magnesium hydroxide (MILK OF MAGNESIA) suspension 30 mL  30 mL Oral Daily PRN [name], DO       paliperidone (INVEGA) 24 hr tablet 6 mg  6 mg Oral QHS [name], DO   6 mg at 10/12/22 2048   traZODone (DESYREL) tablet 50 mg  50 mg Oral QHS PRN [name], DO   50 mg at 10/12/22 2048    Lab Results: No results found for this or any previous visit (from the past 48 hour(s)).  Blood Alcohol level:  Lab Results  Component Value Date   ETH <10 08/17/2022   ETH <10 01/11/2021    Metabolic Disorder Labs: No results found for: "HGBA1C", "MPG" No results found for: "PROLACTIN" No results found for: "CHOL", "TRIG", "HDL", "CHOLHDL", "VLDL", "LDLCALC"  Physical Findings: AIMS:  , ,  ,  ,    CIWA:    COWS:     Musculoskeletal: Strength & Muscle Tone: within normal limits Gait & Station: normal Patient leans: N/A  Psychiatric Specialty Exam:  Presentation  General Appearance:  Casual; Neat  Eye Contact: Fair  Speech: Slow; Slurred  Speech Volume: Decreased  Handedness: Right   Mood and Affect  Mood: Anxious  Affect: Inappropriate   Thought Process  Thought Processes: Disorganized  Descriptions of Associations:Loose  Orientation:Partial  Thought Content:Illogical; Rumination  History of Schizophrenia/Schizoaffective disorder:No data recorded Duration of Psychotic Symptoms:No data recorded Hallucinations:No data recorded Ideas of Reference:Paranoia  Suicidal Thoughts:No data recorded Homicidal Thoughts:No data recorded  Sensorium  Memory: Immediate Fair; Remote Poor  Judgment: Poor  Insight: Poor   Executive Functions  Concentration: Poor  Attention Span: Fair  Recall: [employer] of  Knowledge: Fair  Language: Fair   Psychomotor Activity  Psychomotor Activity:No data recorded  Assets  Assets: Communication Skills   Sleep  Sleep:No data recorded   Blood pressure 138/69, pulse 69, temperature 98.8 F (37.1 C), resp. rate 20, height 5\' 6"  (1.676 m), weight 64 kg, SpO2 100%. Body mass index is 22.76 kg/m.   Treatment Plan Summary: Daily contact with patient to assess and evaluate symptoms and progress in treatment, Medication management, and Plan continue current medications.  [name], DO 10/13/2022, 12:17 PM

## 2022-10-13 NOTE — Plan of Care (Signed)
  Problem: Education: Goal: Knowledge of General Education information will improve Description: Including pain rating scale, medication(s)/side effects and non-pharmacologic comfort measures Outcome: Progressing   Problem: Health Behavior/Discharge Planning: Goal: Ability to manage health-related needs will improve Outcome: Progressing   Problem: Clinical Measurements: Goal: Ability to maintain clinical measurements within normal limits will improve Outcome: Progressing Goal: Diagnostic test results will improve Outcome: Progressing   Problem: Nutrition: Goal: Adequate nutrition will be maintained Outcome: Progressing   Problem: Coping: Goal: Level of anxiety will decrease Outcome: Progressing

## 2022-10-13 NOTE — Progress Notes (Signed)
   10/13/22 0602  15 Minute Checks  Location Bedroom  Visual Appearance Calm  Behavior Sleeping  Sleep (Behavioral Health Patients Only)  Calculate sleep? (Click Yes once per 24 hr at 0600 safety check) Yes  Documented sleep last 24 hours 12.5

## 2022-10-13 NOTE — Group Note (Signed)
[facility] LCSW Group Therapy Note   Group Date: 10/13/2022 Start Time: 1315 End Time: 1355   Type of Therapy/Topic:  Group Therapy:  Balance in Life  Participation Level:  Did Not Attend   Description of Group:    This group will address the concept of balance and how it feels and looks when one is unbalanced. Patients will be encouraged to process areas in their lives that are out of balance, and identify reasons for remaining unbalanced. Facilitators will guide patients utilizing problem- solving interventions to address and correct the stressor making their life unbalanced. Understanding and applying boundaries will be explored and addressed for obtaining  and maintaining a balanced life. Patients will be encouraged to explore ways to assertively make their unbalanced needs known to significant others in their lives, using other group members and facilitator for support and feedback.  Therapeutic Goals: Patient will identify two or more emotions or situations they have that consume much of in their lives. Patient will identify signs/triggers that life has become out of balance:  Patient will identify two ways to set boundaries in order to achieve balance in their lives:  Patient will demonstrate ability to communicate their needs through discussion and/or role plays   Summary of Patient Progress: The patient did not attend group.      Marshell Levan, LCSW

## 2022-10-13 NOTE — Group Note (Signed)
Date:  10/13/2022 Time:  11:32 PM  Group Topic/Focus:  Building Self Esteem:   The Focus of this group is helping patients become aware of the effects of self-esteem on their lives, the things they and others do that enhance or undermine their self-esteem, seeing the relationship between their level of self-esteem and the choices they make and learning ways to enhance self-esteem. Developing a Wellness Toolbox:   The focus of this group is to help patients develop a "wellness toolbox" with skills and strategies to promote recovery upon discharge. Emotional Education:   The focus of this group is to discuss what feelings/emotions are, and how they are experienced. Goals Group:   The focus of this group is to help patients establish daily goals to achieve during treatment and discuss how the patient can incorporate goal setting into their daily lives to aide in recovery. Healthy Communication:   The focus of this group is to discuss communication, barriers to communication, as well as healthy ways to communicate with others. Making Healthy Choices:   The focus of this group is to help patients identify negative/unhealthy choices they were using prior to admission and identify positive/healthier coping strategies to replace them upon discharge. Overcoming Stress:   The focus of this group is to define stress and help patients assess their triggers.    Participation Level:  Active  Participation Quality:  Appropriate  Affect:  Appropriate  Cognitive:  Alert and Appropriate  Insight: Appropriate and Improving  Engagement in Group:  Developing/Improving and Engaged  Modes of Intervention:  Discussion  Additional Comments:    [name] 10/13/2022, 11:32 PM

## 2022-10-13 NOTE — Group Note (Signed)
Date:  10/13/2022 Time:  10:59 AM  Group Topic/Focus:  Making Healthy Choices:   The focus of this group is to help patients identify negative/unhealthy choices they were using prior to admission and identify positive/healthier coping strategies to replace them upon discharge. Overcoming Stress:   The focus of this group is to define stress and help patients assess their triggers. Rediscovering Joy:   The focus of this group is to explore various ways to relieve stress in a positive manner.    Participation Level:  Minimal  Participation Quality:  Inattentive  Affect:  Appropriate  Cognitive:  Alert and Appropriate  Insight: Appropriate  Engagement in Group:  Limited  Modes of Intervention:  Activity, socialization  Additional Comments:  Left group after 10 minutes.  [name] 10/13/2022, 10:59 AM

## 2022-10-14 DIAGNOSIS — F250 Schizoaffective disorder, bipolar type: Secondary | ICD-10-CM | POA: Diagnosis not present

## 2022-10-14 MED ORDER — PALIPERIDONE ER 3 MG PO TB24
3.0000 mg | ORAL_TABLET | Freq: Every day | ORAL | Status: DC
  Administered 2022-10-14 – 2022-11-25 (×43): 3 mg via ORAL
  Filled 2022-10-14 (×43): qty 1

## 2022-10-14 NOTE — Plan of Care (Signed)
  Problem: Education: Goal: Knowledge of General Education information will improve Description: Including pain rating scale, medication(s)/side effects and non-pharmacologic comfort measures Outcome: Progressing   Problem: Health Behavior/Discharge Planning: Goal: Ability to manage health-related needs will improve Outcome: Progressing   Problem: Clinical Measurements: Goal: Ability to maintain clinical measurements within normal limits will improve Outcome: Progressing Goal: Diagnostic test results will improve Outcome: Progressing   Problem: Nutrition: Goal: Adequate nutrition will be maintained Outcome: Progressing   Problem: Coping: Goal: Level of anxiety will decrease Outcome: Progressing   Problem: Elimination: Goal: Will not experience complications related to bowel motility Outcome: Progressing Goal: Will not experience complications related to urinary retention Outcome: Progressing   Problem: Pain Managment: Goal: General experience of comfort will improve Outcome: Progressing   Problem: Safety: Goal: Ability to remain free from injury will improve Outcome: Progressing   Problem: Skin Integrity: Goal: Risk for impaired skin integrity will decrease Outcome: Progressing

## 2022-10-14 NOTE — Progress Notes (Signed)
[facility] MD Progress Note  10/14/2022 1:05 PM Betty Henderson  MRN:  [id number] Subjective: Betty Henderson is seen on rounds.  She has been pleasant and cooperative on the unit.  She has been in good controls.  Nurses report no issues.  She has been compliant with medications and denies any side effects.  She denies any suicidal ideation.  Social work is working with DSS for placement.  She still has delusional thoughts that she can go home.  She took [place] 234 mg on July 5 somewhat to go ahead and decrease her oral Invega to 3 mg at bedtime.  Principal Problem: Schizoaffective disorder, bipolar type (HCC) Diagnosis: Principal Problem:   Schizoaffective disorder, bipolar type (HCC)  Total Time spent with patient: 15 minutes  Past Psychiatric History: Schizoaffective disorder bipolar type  Past Medical History:  Past Medical History:  Diagnosis Date   Anemia    Arthritis    Chronic pain    Drug-seeking behavior    Malingering    Osteopetrosis    Psychosis (HCC)    Schizoaffective disorder, bipolar type (HCC)     Past Surgical History:  Procedure Laterality Date   MOUTH SURGERY     TUBAL LIGATION     Family History:  Family History  Family history unknown: Yes   Family Psychiatric  History: Unremarkable Social History:  Social History   Substance and Sexual Activity  Alcohol Use Not Currently   Comment: 1 cocktail 3 weeks ago     Social History   Substance and Sexual Activity  Drug Use Not Currently   Types: Cocaine   Comment: states "it's legal though"    Social History   Socioeconomic History   Marital status: Single    Spouse name: Not on file   Number of children: Not on file   Years of education: Not on file   Highest education level: Not on file  Occupational History   Not on file  Tobacco Use   Smoking status: Every Day    Current packs/day: 0.50    Types: Cigarettes   Smokeless tobacco: Never  Vaping Use   Vaping status: Never Used  Substance and  Sexual Activity   Alcohol use: Not Currently    Comment: 1 cocktail 3 weeks ago   Drug use: Not Currently    Types: Cocaine    Comment: states "it's legal though"   Sexual activity: Never  Other Topics Concern   Not on file  Social History Narrative   Not on file   Social Determinants of Health   Financial Resource Strain: Not on file  Food Insecurity: Food Insecurity Present (08/22/2022)   Hunger Vital Sign    Worried About Running Out of Food in the Last Year: Sometimes true    Ran Out of Food in the Last Year: Sometimes true  Transportation Needs: Patient Unable To Answer (08/22/2022)   PRAPARE - Transportation    Lack of Transportation (Medical): Patient unable to answer    Lack of Transportation (Non-Medical): Patient unable to answer  Recent Concern: Transportation Needs - Unmet Transportation Needs (06/13/2022)   Received from [facility], [facility]   [facility] - Transportation    Lack of Transportation (Medical): Not on file    Lack of Transportation (Non-Medical): Yes  Physical Activity: Not on file  Stress: Not on file  Social Connections: Unknown (08/06/2021)   Received from [place], [facility]   Social Network    Social Network: Not on file  Additional Social History:                         Sleep: Good  Appetite:  Good  Current Medications: Current Facility-Administered Medications  Medication Dose Route Frequency Provider Last Rate Last Admin   acetaminophen (TYLENOL) tablet 650 mg  650 mg Oral Q6H PRN [name], DO   650 mg at 10/11/22 2139   alum & mag hydroxide-simeth (MAALOX/MYLANTA) 200-200-20 MG/5ML suspension 30 mL  30 mL Oral Q4H PRN [name], DO   30 mL at 09/20/22 1142   diphenhydrAMINE (BENADRYL) capsule 50 mg  50 mg Oral Q6H PRN [name], DO   50 mg at 09/29/22 2254   Or   diphenhydrAMINE (BENADRYL) injection 50 mg  50 mg Intramuscular Q6H PRN [name], DO        haloperidol (HALDOL) tablet 5 mg  5 mg Oral Q6H PRN [name], DO   5 mg at 09/28/22 2149   Or   haloperidol lactate (HALDOL) injection 5 mg  5 mg Intramuscular Q6H PRN [name], DO       ibuprofen (ADVIL) tablet 600 mg  600 mg Oral Q6H PRN [name], [name], MD   600 mg at 10/08/22 2134   LORazepam (ATIVAN) tablet 1 mg  1 mg Oral TID PRN [name], DO   1 mg at 09/27/22 0025   magnesium hydroxide (MILK OF MAGNESIA) suspension 30 mL  30 mL Oral Daily PRN [name], DO       paliperidone (INVEGA) 24 hr tablet 6 mg  6 mg Oral QHS [name], DO   6 mg at 10/13/22 2121   traZODone (DESYREL) tablet 50 mg  50 mg Oral QHS PRN [name], DO   50 mg at 10/12/22 2048    Lab Results: No results found for this or any previous visit (from the past 48 hour(s)).  Blood Alcohol level:  Lab Results  Component Value Date   ETH <10 08/17/2022   ETH <10 01/11/2021    Metabolic Disorder Labs: No results found for: "HGBA1C", "MPG" No results found for: "PROLACTIN" No results found for: "CHOL", "TRIG", "HDL", "CHOLHDL", "VLDL", "LDLCALC"  Physical Findings: AIMS:  , ,  ,  ,    CIWA:    COWS:     Musculoskeletal: Strength & Muscle Tone: within normal limits Gait & Station: normal Patient leans: N/A  Psychiatric Specialty Exam:  Presentation  General Appearance:  Casual; Neat  Eye Contact: Fair  Speech: Slow; Slurred  Speech Volume: Decreased  Handedness: Right   Mood and Affect  Mood: Anxious  Affect: Inappropriate   Thought Process  Thought Processes: Disorganized  Descriptions of Associations:Loose  Orientation:Partial  Thought Content:Illogical; Rumination  History of Schizophrenia/Schizoaffective disorder:No data recorded Duration of Psychotic Symptoms:No data recorded Hallucinations:No data recorded Ideas of Reference:Paranoia  Suicidal Thoughts:No data recorded Homicidal  Thoughts:No data recorded  Sensorium  Memory: Immediate Fair; Remote Poor  Judgment: Poor  Insight: Poor   Executive Functions  Concentration: Poor  Attention Span: Fair  Recall: [employer] of Knowledge: Fair  Language: Fair   Psychomotor Activity  Psychomotor Activity:No data recorded  Assets  Assets: Communication Skills   Sleep  Sleep:No data recorded   Physical Exam: Physical Exam Vitals and nursing note reviewed.  Constitutional:      Appearance: Normal appearance. She is normal weight.  Neurological:  General: No focal deficit present.     Mental Status: She is alert and oriented to person, place, and time.  Psychiatric:        Attention and Perception: Attention and perception normal.        Mood and Affect: Mood and affect normal.        Speech: Speech normal.        Behavior: Behavior normal. Behavior is cooperative.        Thought Content: Thought content normal.        Cognition and Memory: Cognition and memory normal.        Judgment: Judgment normal.    Review of Systems  Constitutional: Negative.   HENT: Negative.    Eyes: Negative.   Respiratory: Negative.    Cardiovascular: Negative.   Gastrointestinal: Negative.   Genitourinary: Negative.   Musculoskeletal: Negative.   Skin: Negative.   Neurological: Negative.   Endo/Heme/Allergies: Negative.   Psychiatric/Behavioral: Negative.     Blood pressure 113/61, pulse 73, temperature (!) 97.5 F (36.4 C), resp. rate 14, height 5\' 6"  (1.676 m), weight 64 kg, SpO2 98%. Body mass index is 22.76 kg/m.   Treatment Plan Summary: Daily contact with patient to assess and evaluate symptoms and progress in treatment, Medication management, and Plan decrease oral Invega to 3 mg at bedtime.  Chord Takahashi [place], DO 10/14/2022, 1:05 PM

## 2022-10-14 NOTE — Group Note (Signed)
LCSW Group Therapy Note  Group Date: 10/14/2022 Start Time: 1300 End Time: 1445   Type of Therapy and Topic:  Group Therapy: Positive Affirmations  Participation Level:  Active   Description of Group:   This group addressed positive affirmation towards self and others.  Patients went around the room and identified two positive things about themselves and two positive things about a peer in the room.  Patients reflected on how it felt to share something positive with others, to identify positive things about themselves, and to hear positive things from others/ Patients were encouraged to have a daily reflection of positive characteristics or circumstances.   Therapeutic Goals: Patients will verbalize two of their positive qualities Patients will demonstrate empathy for others by stating two positive qualities about a peer in the group Patients will verbalize their feelings when voicing positive self affirmations and when voicing positive affirmations of others Patients will discuss the potential positive impact on their wellness/recovery of focusing on positive traits of self and others.  Summary of Patient Progress:  Betty Henderson actively engaged in the discussion and was able identify positive affirmations about herself as well as other group members. Patient demonstrated fair insight into the subject matter, was respectful of peers, participated throughout the entire session.  Therapeutic Modalities:   Cognitive Behavioral Therapy Motivational Interviewing    Elza Rafter, [place] 10/14/2022  2:21 PM

## 2022-10-14 NOTE — Progress Notes (Signed)
   10/14/22 0900  Psych Admission Type (Psych Patients Only)  Admission Status Voluntary  Psychosocial Assessment  Patient Complaints None  Eye Contact Fair  Facial Expression Animated  Affect Blunted  Speech Slurred  Interaction Minimal  Motor Activity Shuffling  Appearance/Hygiene Layered clothes  Behavior Characteristics Calm  Mood Pleasant  Thought Process  Coherency Disorganized  Content Preoccupation  Delusions None reported or observed  Perception Hallucinations  Hallucination Visual;Auditory  Judgment Impaired  Confusion Mild  Danger to Self  Current suicidal ideation? Denies  Danger to Others  Danger to Others None reported or observed

## 2022-10-14 NOTE — Plan of Care (Signed)
  Problem: Education: Goal: Knowledge of General Education information will improve Description: Including pain rating scale, medication(s)/side effects and non-pharmacologic comfort measures Outcome: Progressing   Problem: Health Behavior/Discharge Planning: Goal: Ability to manage health-related needs will improve Outcome: Progressing   Problem: Clinical Measurements: Goal: Ability to maintain clinical measurements within normal limits will improve Outcome: Progressing Goal: Diagnostic test results will improve Outcome: Progressing   Problem: Nutrition: Goal: Adequate nutrition will be maintained Outcome: Progressing   Problem: Coping: Goal: Level of anxiety will decrease Outcome: Progressing

## 2022-10-14 NOTE — Plan of Care (Signed)
  Problem: Elimination: Goal: Will not experience complications related to urinary retention Outcome: Progressing   Problem: Pain Managment: Goal: General experience of comfort will improve Outcome: Progressing   Problem: Safety: Goal: Ability to remain free from injury will improve Outcome: Progressing  Patient is compliant with treatment plan denies SI/HI endorsing A/VH and verbally contracts for safety. Patient interacting well with Peers and Staff. Q 15 minutes safety checks ongoing Patient remains safe.

## 2022-10-14 NOTE — Group Note (Signed)
Recreation Therapy Group Note   Group Topic:Self-Esteem  Group Date: 10/14/2022 Start Time: 1400 End Time: 1500 Facilitators: Rosina Lowenstein, LRT, CTRS Location:  Dayroom and Courtyard    Group Description: Positive Focus. Patients are given a handout that has 9 different boxes on it. Each box has a different prompt on it that requires you to identify and add something positive about themselves. LRT encourages pts to share at least two of their boxes to the group. LRT and pts discuss the importance of "thinking positive", self-esteem and how they can apply it to their everyday life post-discharge. After completing worksheet, patients played Positive Affirmation Bingo and won stress balls, sudoku, or word search books as prizes. LRT gave pts the option to go outside for fresh air and sunlight with remaining time left.   Goal Area(s) Addressed: Patient will identify positive qualities about themselves. Patient will identify definition of "self-esteem". Patients will identify the difference between positive and negative self-esteem.  Patient will recite positive qualities and affirmations aloud to the group.   Affect/Mood: Appropriate   Participation Level: Active and Engaged   Participation Quality: Independent   Behavior: Appropriate, Calm, and Cooperative   Speech/Thought Process: Coherent   Insight: Good   Judgement: Good   Modes of Intervention: Activity   Patient Response to Interventions:  Attentive, Engaged, Interested , and Receptive   Education Outcome:  Acknowledges education   Clinical Observations/Individualized Feedback: Betty Henderson was active in their participation of session activities and group discussion. Pt identified "Being a mom" is one positive responsibility she has and that a positive time and felt proud of herself was "when I left home". Pt did not want to play bingo, however went outside with peers. Pt interacted well and was pleasant throughout session.    Plan: Continue to engage patient in RT group sessions 2-3x/week.   Rosina Lowenstein, LRT, CTRS 10/14/2022 3:26 PM

## 2022-10-14 NOTE — Group Note (Signed)
Date:  10/14/2022 Time:  10:29 AM  Group Topic/Focus:  Wellness Toolbox:   The focus of this group is to discuss various aspects of wellness, balancing those aspects and exploring ways to increase the ability to experience wellness.  Patients will create a wellness toolbox for use upon discharge.    Participation Level:  Did Not Attend   Mary Sella Laryssa Hassing 10/14/2022, 10:29 AM

## 2022-10-15 DIAGNOSIS — F250 Schizoaffective disorder, bipolar type: Secondary | ICD-10-CM | POA: Diagnosis not present

## 2022-10-15 MED ORDER — PALIPERIDONE PALMITATE ER 156 MG/ML IM SUSY
156.0000 mg | PREFILLED_SYRINGE | Freq: Once | INTRAMUSCULAR | Status: AC
  Administered 2022-10-15: 156 mg via INTRAMUSCULAR
  Filled 2022-10-15: qty 1

## 2022-10-15 NOTE — Plan of Care (Signed)
  Problem: Education: Goal: Knowledge of General Education information will improve Description: Including pain rating scale, medication(s)/side effects and non-pharmacologic comfort measures Outcome: Progressing   Problem: Health Behavior/Discharge Planning: Goal: Ability to manage health-related needs will improve Outcome: Progressing   Problem: Clinical Measurements: Goal: Ability to maintain clinical measurements within normal limits will improve Outcome: Progressing Goal: Diagnostic test results will improve Outcome: Progressing   Problem: Nutrition: Goal: Adequate nutrition will be maintained Outcome: Progressing

## 2022-10-15 NOTE — BHH Counselor (Signed)
CSW contacted pt's son [phone] at request of financial counselor [name], CSW was unable to reach [facility] and LVM  Reynaldo Minium, MSW, [facility] 10/15/2022 2:22 PM

## 2022-10-15 NOTE — Group Note (Signed)
Date:  10/15/2022 Time:  6:46 PM  Group Topic/Focus:  Activity    Participation Level:  Active  Participation Quality:  Appropriate  Affect:  Appropriate  Cognitive:  Appropriate  Insight: Appropriate  Engagement in Group:  Engaged  Modes of Intervention:  Activity  Additional Comments:    [name] 10/15/2022, 6:46 PM

## 2022-10-15 NOTE — Plan of Care (Signed)
  Problem: Elimination: Goal: Will not experience complications related to bowel motility Outcome: Progressing Goal: Will not experience complications related to urinary retention Outcome: Progressing Patient compliant with medication scheduled IM Luvenia Starch given and no adverse effects noted. Patient interacting well with Peers and Staff. Visible in Milieu this shift. Appears to be responding to internal Stimuli while in her room. Support and encouragement provided.

## 2022-10-15 NOTE — Progress Notes (Signed)
[facility] MD Progress Note  10/15/2022  Betty Henderson  MRN:  [id number] Subjective: Case discussed with RN and social worker, chart reviewed, patient seen today during rounds.  Per staff report patient is doing fine on the unit.  No acute events reported overnight.  Patient is observed talking to self in her room although she continues to denies auditory hallucinations.  Patient's thought process is less disorganized as compared to few weeks ago.  Patient is tolerating medicine fine without any side effects.  She reports her sleep is fine. Patient denies any thoughts of harming herself or others.  Social worker is working on placement.    Principal Problem: Schizoaffective disorder, bipolar type (HCC) Diagnosis: Principal Problem:   Schizoaffective disorder, bipolar type (HCC)  Past Psychiatric History: Schizoaffective disorder bipolar type  Past Medical History:  Past Medical History:  Diagnosis Date   Anemia    Arthritis    Chronic pain    Drug-seeking behavior    Malingering    Osteopetrosis    Psychosis (HCC)    Schizoaffective disorder, bipolar type (HCC)     Past Surgical History:  Procedure Laterality Date   MOUTH SURGERY     TUBAL LIGATION     Family History:  Family History  Family history unknown: Yes   Family Psychiatric  History: Unremarkable Social History:  Social History   Substance and Sexual Activity  Alcohol Use Not Currently   Comment: 1 cocktail 3 weeks ago     Social History   Substance and Sexual Activity  Drug Use Not Currently   Types: Cocaine   Comment: states "it's legal though"    Social History   Socioeconomic History   Marital status: Single    Spouse name: Not on file   Number of children: Not on file   Years of education: Not on file   Highest education level: Not on file  Occupational History   Not on file  Tobacco Use   Smoking status: Every Day    Current packs/day: 0.50    Types: Cigarettes   Smokeless tobacco: Never   Vaping Use   Vaping status: Never Used  Substance and Sexual Activity   Alcohol use: Not Currently    Comment: 1 cocktail 3 weeks ago   Drug use: Not Currently    Types: Cocaine    Comment: states "it's legal though"   Sexual activity: Never  Other Topics Concern   Not on file  Social History Narrative   Not on file   Social Determinants of Health   Financial Resource Strain: Not on file  Food Insecurity: Food Insecurity Present (08/22/2022)   Hunger Vital Sign    Worried About Running Out of Food in the Last Year: Sometimes true    Ran Out of Food in the Last Year: Sometimes true  Transportation Needs: Patient Unable To Answer (08/22/2022)   PRAPARE - Transportation    Lack of Transportation (Medical): Patient unable to answer    Lack of Transportation (Non-Medical): Patient unable to answer  Recent Concern: Transportation Needs - Unmet Transportation Needs (06/13/2022)   Received from [facility], [facility]   [facility] - Transportation    Lack of Transportation (Medical): Not on file    Lack of Transportation (Non-Medical): Yes  Physical Activity: Not on file  Stress: Not on file  Social Connections: Unknown (08/06/2021)   Received from [facility], [facility]   Social Network    Social Network: Not on file   Additional  Social History:                         Sleep: Good  Appetite:  Good  Current Medications: Current Facility-Administered Medications  Medication Dose Route Frequency Provider Last Rate Last Admin   acetaminophen (TYLENOL) tablet 650 mg  650 mg Oral Q6H PRN [name], DO   650 mg at 10/11/22 2139   alum & mag hydroxide-simeth (MAALOX/MYLANTA) 200-200-20 MG/5ML suspension 30 mL  30 mL Oral Q4H PRN [name], DO   30 mL at 09/20/22 1142   diphenhydrAMINE (BENADRYL) capsule 50 mg  50 mg Oral Q6H PRN [name], DO   50 mg at 09/29/22 2254   Or   diphenhydrAMINE (BENADRYL) injection 50 mg  50 mg  Intramuscular Q6H PRN [name], DO       haloperidol (HALDOL) tablet 5 mg  5 mg Oral Q6H PRN [name], DO   5 mg at 09/28/22 2149   Or   haloperidol lactate (HALDOL) injection 5 mg  5 mg Intramuscular Q6H PRN [name], DO       ibuprofen (ADVIL) tablet 600 mg  600 mg Oral Q6H PRN [name], [name], MD   600 mg at 10/08/22 2134   LORazepam (ATIVAN) tablet 1 mg  1 mg Oral TID PRN [name], DO   1 mg at 09/27/22 0025   magnesium hydroxide (MILK OF MAGNESIA) suspension 30 mL  30 mL Oral Daily PRN [name], DO       paliperidone (INVEGA) 24 hr tablet 3 mg  3 mg Oral QHS [name], DO   3 mg at 10/14/22 2055   traZODone (DESYREL) tablet 50 mg  50 mg Oral QHS PRN [name], DO   50 mg at 10/14/22 2055    Lab Results: No results found for this or any previous visit (from the past 48 hour(s)).  Blood Alcohol level:  Lab Results  Component Value Date   ETH <10 08/17/2022   ETH <10 01/11/2021    Metabolic Disorder Labs: No results found for: "HGBA1C", "MPG" No results found for: "PROLACTIN" No results found for: "CHOL", "TRIG", "HDL", "CHOLHDL", "VLDL", "LDLCALC"  Physical Findings: AIMS:  , ,  ,  ,    CIWA:    COWS:     Musculoskeletal: Strength & Muscle Tone: within normal limits Gait & Station: normal Patient leans: N/A  Psychiatric Specialty Exam:  Presentation  General Appearance:  Casual; Neat  Eye Contact: Fair  Speech: Slow, Garbled  Speech Volume: Decreased  Handedness: Right   Mood and Affect  Mood: Anxious  Affect: Constricted and avoidant   Thought Process  Thought Processes: Disorganized Probably at baseline. Improved since admission  Descriptions of Associations:Loose  Orientation:Partial  Thought Content:Denies SI/HI, delusional  Hallucinations:Denies , but is observed talking to self Ideas of Reference:Paranoia  Suicidal Thoughts:Denies  SI Homicidal Thoughts:Denies HI  Sensorium  Memory: Immediate Fair; Remote Poor  Judgment: Poor  Insight: Poor   Executive Functions  Concentration: Poor  Attention Span: Short  Language: Fair   Psychomotor Activity  Psychomotor Activity:Variable, at times decreased, and at times increased  Assets  Assets: Communication Skills   Sleep  Sleep:Improved   Physical Exam: Physical Exam Vitals and nursing note reviewed.  Constitutional:      Appearance: Normal appearance. She is normal weight.  Neurological:     General: No focal deficit present.  Mental Status: She is alert.    Review of Systems  Constitutional: Negative.   HENT: Negative.    Eyes: Negative.   Respiratory: Negative.    Cardiovascular: Negative.   Gastrointestinal: Negative.   Genitourinary: Negative.   Musculoskeletal: Negative.   Skin: Negative.   Neurological: Negative.   Endo/Heme/Allergies: Negative.   Psychiatric/Behavioral: Negative.     Blood pressure (!) 108/56, pulse 68, temperature 98.2 F (36.8 C), resp. rate 15, height 5\' 6"  (1.676 m), weight 64 kg, SpO2 98%. Body mass index is 22.76 kg/m.   Treatment Plan Summary: Daily contact with patient to assess and evaluate symptoms and progress in treatment and Medication management  Patient received Invega [place] 234 mg IM on 09/27/22\ Invega [place] 156 mg IM Today, 10/15/22 to be repeated every 4 weeks. Continue on Invega 3 mg Po daily   [name], MD

## 2022-10-15 NOTE — Group Note (Signed)
Recreation Therapy Group Note   Group Topic:Relaxation  Group Date: 10/15/2022 Start Time: 1400 End Time: 1445 Facilitators: Clinton Gallant, CTRS Location:  Dayroom  Group Description: Chair Yoga. LRT and patients discussed the benefits of yoga and how it differs from strength exercises. LRT educated patients on the mental and physical benefits of yoga and deep breathing and how it can be used as a Associate Professor. LRT and patients followed along to a guided yoga session on the television that focused on all parts of the body, as well as deep breathing. Pt encouraged to stop movement at any time if they feel discomfort or pain.   Goal Area(s) Addressed: Patient will practice using relaxation technique. Patient will identify a new coping skill.  Patient will follow multistep directions to reduce anxiety and stress.   Affect/Mood: Appropriate   Participation Level: Active and Engaged   Participation Quality: Independent   Behavior: Calm, Cooperative and Appropriate    Speech/Thought Process: Coherent   Insight: Good   Judgement: Good   Modes of Intervention: Activity   Patient Response to Interventions:  Receptive   Education Outcome:  Acknowledges education   Clinical Observations/Individualized Feedback: Betty Henderson was active in their participation of session activities and group discussion. Pt appropriately completed exercises as shown while interacting well with LRT and peers duration of session.   Plan: Continue to engage patient in RT group sessions 2-3x/week.   [address] LRT, CTRS 10/15/2022 3:11 PM

## 2022-10-15 NOTE — Progress Notes (Signed)
   10/15/22 1016  Psych Admission Type (Psych Patients Only)  Admission Status Voluntary  Psychosocial Assessment  Patient Complaints None  Eye Contact Fair  Facial Expression Animated  Affect Blunted  Speech Slurred  Interaction Minimal  Motor Activity Shuffling  Appearance/Hygiene Layered clothes  Behavior Characteristics Calm  Mood Pleasant  Thought Process  Coherency Disorganized;Loose associations  Content Preoccupation;Confabulation  Delusions None reported or observed  Perception Hallucinations  Hallucination Visual;Auditory  Judgment Impaired  Confusion Mild  Danger to Self  Current suicidal ideation? Denies  Danger to Others  Danger to Others None reported or observed

## 2022-10-16 DIAGNOSIS — F250 Schizoaffective disorder, bipolar type: Secondary | ICD-10-CM | POA: Diagnosis not present

## 2022-10-16 MED ORDER — PALIPERIDONE PALMITATE ER 156 MG/ML IM SUSY
156.0000 mg | PREFILLED_SYRINGE | Freq: Once | INTRAMUSCULAR | Status: AC
  Administered 2022-11-13: 156 mg via INTRAMUSCULAR
  Filled 2022-10-16: qty 1

## 2022-10-16 NOTE — Group Note (Signed)
Date:  10/16/2022 Time:  8:08 PM  Group Topic/Focus:  Self Care:   The focus of this group is to help patients understand the importance of self-care in order to improve or restore emotional, physical, spiritual, interpersonal, and financial health.    Participation Level:  Active  Participation Quality:  Appropriate  Affect:  Appropriate  Cognitive:  Appropriate  Insight: Appropriate  Engagement in Group:  Engaged  Modes of Intervention:  Exploration  Additional Comments:    [name] 10/16/2022, 8:08 PM

## 2022-10-16 NOTE — Progress Notes (Signed)
   10/16/22 0700  Psych Admission Type (Psych Patients Only)  Admission Status Voluntary  Psychosocial Assessment  Patient Complaints None  Eye Contact Fair  Facial Expression Flat  Affect Flat  Speech Slurred  Interaction Minimal  Motor Activity Shuffling;Slow  Appearance/Hygiene Unremarkable  Behavior Characteristics Calm  Mood Pleasant  Thought Process  Coherency Disorganized  Content Preoccupation  Delusions None reported or observed  Perception Hallucinations  Hallucination Auditory;Visual  Judgment Impaired  Confusion Mild  Danger to Self  Current suicidal ideation? Denies  Agreement Not to Harm Self Yes  Danger to Others  Danger to Others None reported or observed  Danger to Others Abnormal  Harmful Behavior to others No threats or harm toward other people  Destructive Behavior No threats or harm toward property   Attends group. Minimal interaction with staff and peers. Denies SI/HI/AVH

## 2022-10-16 NOTE — Group Note (Signed)
Recreation Therapy Group Note   Group Topic:General Recreation  Group Date: 10/16/2022 Start Time: 1400 End Time: 1435 Facilitators: Rosina Lowenstein, LRT, CTRS Location: Courtyard  Group Description: Outdoor Recreation. Patients had the option to play hot potato, play with a deck of cards or sit and listen to music while outside in the courtyard getting fresh air and sunlight. LRT and pts discussed things that they enjoy doing in their free time outside of the hospital.   Goal Area(s) Addressed: Patient will identify leisure interests.  Patient will practice healthy decision making. Patient will engage in recreation activity.    Affect/Mood: Elevated   Participation Level: Active and Engaged   Participation Quality: Independent   Behavior: Alert, Cooperative, and Preoccupied   Speech/Thought Process: Coherent   Insight: Fair   Judgement: Fair    Modes of Intervention: Activity   Patient Response to Interventions:  Receptive   Education Outcome:  Acknowledges education   Clinical Observations/Individualized Feedback: Betty Henderson was mostly active in their participation of session activities and group discussion. Pt identified "this is a nice hospital, this has been a nice place to stay" during conversation. Pt was talking to herself a lot more today than in previous interactions. Overall, pt interacted well with LRT and peers duration of session.   Plan: Continue to engage patient in RT group sessions 2-3x/week.   Rosina Lowenstein, LRT, CTRS 10/16/2022 3:01 PM

## 2022-10-16 NOTE — Group Note (Signed)
Date:  10/16/2022 Time:  3:00 PM  Group Topic/Focus:  Personal Choices and Values:   The focus of this group is to help patients assess and explore the importance of values in their lives, how their values affect their decisions, how they express their values and what opposes their expression.    Participation Level:  Active  Participation Quality:  Appropriate  Affect:  Appropriate  Cognitive:  Appropriate  Insight: Appropriate  Engagement in Group:  Engaged  Modes of Intervention:  Activity  Additional Comments:  None  Rodena Goldmann 10/16/2022, 3:00 PM

## 2022-10-16 NOTE — Group Note (Signed)
Date:  10/16/2022 Time:  1:46 AM  Group Topic/Focus:  Building Self Esteem:   The Focus of this group is helping patients become aware of the effects of self-esteem on their lives, the things they and others do that enhance or undermine their self-esteem, seeing the relationship between their level of self-esteem and the choices they make and learning ways to enhance self-esteem. Coping With Mental Health Crisis:   The purpose of this group is to help patients identify strategies for coping with mental health crisis.  Group discusses possible causes of crisis and ways to manage them effectively. Developing a Wellness Toolbox:   The focus of this group is to help patients develop a "wellness toolbox" with skills and strategies to promote recovery upon discharge. Goals Group:   The focus of this group is to help patients establish daily goals to achieve during treatment and discuss how the patient can incorporate goal setting into their daily lives to aide in recovery. Healthy Communication:   The focus of this group is to discuss communication, barriers to communication, as well as healthy ways to communicate with others. Identifying Needs:   The focus of this group is to help patients identify their personal needs that have been historically problematic and identify healthy behaviors to address their needs. Making Healthy Choices:   The focus of this group is to help patients identify negative/unhealthy choices they were using prior to admission and identify positive/healthier coping strategies to replace them upon discharge. Managing Feelings:   The focus of this group is to identify what feelings patients have difficulty handling and develop a plan to handle them in a healthier way upon discharge. Self Care:   The focus of this group is to help patients understand the importance of self-care in order to improve or restore emotional, physical, spiritual, interpersonal, and financial  health.    Participation Level:  Active  Participation Quality:  Appropriate  Affect:  Appropriate  Cognitive:  Appropriate  Insight: Appropriate  Engagement in Group:  Engaged  Modes of Intervention:  Support  Additional Comments:    Betty Henderson 10/16/2022, 1:46 AM

## 2022-10-16 NOTE — Progress Notes (Signed)
[facility] MD Progress Note  10/16/2022  Betty Henderson  MRN:  [id number] Subjective: Case discussed with RN and social worker, chart reviewed, patient seen today during rounds.  Per staff report patient is doing fine on the unit.  No acute events reported overnight.  Patient's thought process is less disorganized as compared to few weeks ago.  Patient is tolerating medicine fine without any side effects.  Patient is observed talking to self in her room although she continues to denies auditory hallucinations. . Patient denies any thoughts of harming herself or others.  Social worker is working on placement.    Principal Problem: Schizoaffective disorder, bipolar type (HCC) Diagnosis: Principal Problem:   Schizoaffective disorder, bipolar type (HCC)  Past Psychiatric History: Schizoaffective disorder bipolar type  Past Medical History:  Past Medical History:  Diagnosis Date   Anemia    Arthritis    Chronic pain    Drug-seeking behavior    Malingering    Osteopetrosis    Psychosis (HCC)    Schizoaffective disorder, bipolar type (HCC)     Past Surgical History:  Procedure Laterality Date   MOUTH SURGERY     TUBAL LIGATION     Family History:  Family History  Family history unknown: Yes   Family Psychiatric  History: Unremarkable Social History:  Social History   Substance and Sexual Activity  Alcohol Use Not Currently   Comment: 1 cocktail 3 weeks ago     Social History   Substance and Sexual Activity  Drug Use Not Currently   Types: Cocaine   Comment: states "it's legal though"    Social History   Socioeconomic History   Marital status: Single    Spouse name: Not on file   Number of children: Not on file   Years of education: Not on file   Highest education level: Not on file  Occupational History   Not on file  Tobacco Use   Smoking status: Every Day    Current packs/day: 0.50    Types: Cigarettes   Smokeless tobacco: Never  Vaping Use   Vaping status:  Never Used  Substance and Sexual Activity   Alcohol use: Not Currently    Comment: 1 cocktail 3 weeks ago   Drug use: Not Currently    Types: Cocaine    Comment: states "it's legal though"   Sexual activity: Never  Other Topics Concern   Not on file  Social History Narrative   Not on file   Social Determinants of Health   Financial Resource Strain: Not on file  Food Insecurity: Food Insecurity Present (08/22/2022)   Hunger Vital Sign    Worried About Running Out of Food in the Last Year: Sometimes true    Ran Out of Food in the Last Year: Sometimes true  Transportation Needs: Patient Unable To Answer (08/22/2022)   PRAPARE - Transportation    Lack of Transportation (Medical): Patient unable to answer    Lack of Transportation (Non-Medical): Patient unable to answer  Recent Concern: Transportation Needs - Unmet Transportation Needs (06/13/2022)   Received from [facility], [facility]   [facility] - Transportation    Lack of Transportation (Medical): Not on file    Lack of Transportation (Non-Medical): Yes  Physical Activity: Not on file  Stress: Not on file  Social Connections: Unknown (08/06/2021)   Received from [facility], [facility]   Social Network    Social Network: Not on file   Additional Social History:  Sleep: Good  Appetite:  Good  Current Medications: Current Facility-Administered Medications  Medication Dose Route Frequency Provider Last Rate Last Admin   acetaminophen (TYLENOL) tablet 650 mg  650 mg Oral Q6H PRN [name], DO   650 mg at 10/15/22 2123   alum & mag hydroxide-simeth (MAALOX/MYLANTA) 200-200-20 MG/5ML suspension 30 mL  30 mL Oral Q4H PRN [name], DO   30 mL at 09/20/22 1142   diphenhydrAMINE (BENADRYL) capsule 50 mg  50 mg Oral Q6H PRN [name], DO   50 mg at 09/29/22 2254   Or   diphenhydrAMINE (BENADRYL) injection 50 mg  50 mg Intramuscular Q6H PRN [name], DO       haloperidol (HALDOL) tablet 5 mg  5 mg Oral Q6H PRN [name], DO   5 mg at 09/28/22 2149   Or   haloperidol lactate (HALDOL) injection 5 mg  5 mg Intramuscular Q6H PRN [name], DO       ibuprofen (ADVIL) tablet 600 mg  600 mg Oral Q6H PRN [name], [name], MD   600 mg at 10/08/22 2134   LORazepam (ATIVAN) tablet 1 mg  1 mg Oral TID PRN [name], DO   1 mg at 09/27/22 0025   magnesium hydroxide (MILK OF MAGNESIA) suspension 30 mL  30 mL Oral Daily PRN [name], DO       paliperidone (INVEGA) 24 hr tablet 3 mg  3 mg Oral QHS [name], DO   3 mg at 10/15/22 2123   traZODone (DESYREL) tablet 50 mg  50 mg Oral QHS PRN [name], DO   50 mg at 10/15/22 2123    Lab Results: No results found for this or any previous visit (from the past 48 hour(s)).  Blood Alcohol level:  Lab Results  Component Value Date   ETH <10 08/17/2022   ETH <10 01/11/2021    Metabolic Disorder Labs: No results found for: "HGBA1C", "MPG" No results found for: "PROLACTIN" No results found for: "CHOL", "TRIG", "HDL", "CHOLHDL", "VLDL", "LDLCALC"  Physical Findings: AIMS:  , ,  ,  ,    CIWA:    COWS:     Musculoskeletal: Strength & Muscle Tone: within normal limits Gait & Station: normal Patient leans: N/A  Psychiatric Specialty Exam:  Presentation  General Appearance:  Casual; Neat  Eye Contact: Fair  Speech: Slow, Garbled  Speech Volume: Decreased  Handedness: Right   Mood and Affect  Mood: Anxious  Affect: Constricted and avoidant   Thought Process  Thought Processes: Disorganized Probably at baseline. Improved since admission  Descriptions of Associations:Loose  Orientation:Partial  Thought Content:Denies SI/HI, delusional  Hallucinations:Denies , but is observed talking to self Ideas of Reference:Paranoia  Suicidal Thoughts:Denies SI Homicidal Thoughts:Denies  HI  Sensorium  Memory: Immediate Fair; Remote Poor  Judgment: Fair, pt is Rx compliant  Insight: Limited   Executive Functions  Concentration: Poor  Attention Span: Short  Language: Fair   Psychomotor Activity  Psychomotor Activity:Variable, at times decreased, and at times increased  Assets  Assets: Communication Skills   Sleep  Sleep:Improved   Physical Exam: Physical Exam Vitals and nursing note reviewed.  Constitutional:      Appearance: Normal appearance. She is normal weight.  Neurological:     General: No focal deficit present.     Mental Status: She is alert.    Review of Systems  Constitutional: Negative.   HENT: Negative.  Eyes: Negative.   Respiratory: Negative.    Cardiovascular: Negative.   Gastrointestinal: Negative.   Genitourinary: Negative.   Musculoskeletal: Negative.   Skin: Negative.   Neurological: Negative.   Endo/Heme/Allergies: Negative.   Psychiatric/Behavioral: Negative.     Blood pressure 125/69, pulse 67, temperature 97.9 F (36.6 C), resp. rate 15, height 5\' 6"  (1.676 m), weight 64 kg, SpO2 100%. Body mass index is 22.76 kg/m.   Treatment Plan Summary: Daily contact with patient to assess and evaluate symptoms and progress in treatment and Medication management  Patient received Invega Sustenna 234 mg IM on 09/27/22\ Invega Sustenna 156 mg IM given, 10/15/22 to be repeated every 4 weeks. Continue on Invega 3 mg Po daily   [name], MD

## 2022-10-17 DIAGNOSIS — F250 Schizoaffective disorder, bipolar type: Secondary | ICD-10-CM | POA: Diagnosis not present

## 2022-10-17 NOTE — Progress Notes (Signed)
Patient was admitted involuntarily on Aug 22, 2022 for worsening psychosis with a dx of schizoaffective disorder. She is now a voluntary admission She denies SI/HI/AVH. She denies anxiety and depression.   Tylenol 650 mg adm at 1407 for c/o headache 8/10. Upon follow up, pain decreased to a 4.   Discharge planning ongoing, awaiting placement. Q15 minute unit checks in place.

## 2022-10-17 NOTE — Group Note (Signed)
Recreation Therapy Group Note   Group Topic:Coping Skills  Group Date: 10/17/2022 Start Time: 1400 End Time: 1450 Facilitators: Rosina Lowenstein, LRT, CTRS Location:  Dayroom  Group Description: Seated Exercise. LRT discussed the mental and physical benefits of exercise. LRT and group discussed how physical activity can be used as a coping skill. Pt's and LRT followed along to an exercise video on the TV screen that provided a visual representation and audio description of every exercise performed. Pt's encouraged to listen to their bodies and stop at any time if they experience feelings of discomfort or pain. LRT passed out water after session was over and encouraged pts do drink and stay hydrated.  Goal Area(s) Addressed: Patient will learn benefits of physical activity. Patient will identify exercise as a coping skill.  Patient will follow multistep directions. Patient will try a new leisure interest.    Affect/Mood: Appropriate   Participation Level: Moderate   Participation Quality: Independent   Behavior: Alert, Calm, and Cooperative   Speech/Thought Process: Coherent   Insight: Fair   Judgement: Fair    Modes of Intervention: Activity   Patient Response to Interventions:  Receptive   Education Outcome:  Acknowledges education   Clinical Observations/Individualized Feedback: Betty Henderson was mostly active in their participation of session activities and group discussion. Pt completed about half of the exercises shown. Pt was talking to herself often throughout video. Pt requested water after session.    Plan: Continue to engage patient in RT group sessions 2-3x/week.   Rosina Lowenstein, LRT, CTRS 10/17/2022 3:14 PM

## 2022-10-17 NOTE — Progress Notes (Signed)
[facility] MD Progress Note  10/17/2022  Betty Henderson  MRN:  [id number] Subjective: Case discussed with RN and social worker, chart reviewed, patient seen today during rounds.  Per staff report patient is doing fine on the unit.  No acute events reported overnight.  Patient is tolerating medicine fine without any side effects.  Patient is occasionally observed talking to self in her room although she continues to denies auditory hallucinations. . Patient denies any thoughts of harming herself or others.  Social worker is working on placement.    Principal Problem: Schizoaffective disorder, bipolar type (HCC) Diagnosis: Principal Problem:   Schizoaffective disorder, bipolar type (HCC)  Past Psychiatric History: Schizoaffective disorder bipolar type  Past Medical History:  Past Medical History:  Diagnosis Date   Anemia    Arthritis    Chronic pain    Drug-seeking behavior    Malingering    Osteopetrosis    Psychosis (HCC)    Schizoaffective disorder, bipolar type (HCC)     Past Surgical History:  Procedure Laterality Date   MOUTH SURGERY     TUBAL LIGATION     Family History:  Family History  Family history unknown: Yes   Family Psychiatric  History: Unremarkable Social History:  Social History   Substance and Sexual Activity  Alcohol Use Not Currently   Comment: 1 cocktail 3 weeks ago     Social History   Substance and Sexual Activity  Drug Use Not Currently   Types: Cocaine   Comment: states "it's legal though"    Social History   Socioeconomic History   Marital status: Single    Spouse name: Not on file   Number of children: Not on file   Years of education: Not on file   Highest education level: Not on file  Occupational History   Not on file  Tobacco Use   Smoking status: Every Day    Current packs/day: 0.50    Types: Cigarettes   Smokeless tobacco: Never  Vaping Use   Vaping status: Never Used  Substance and Sexual Activity   Alcohol use: Not  Currently    Comment: 1 cocktail 3 weeks ago   Drug use: Not Currently    Types: Cocaine    Comment: states "it's legal though"   Sexual activity: Never  Other Topics Concern   Not on file  Social History Narrative   Not on file   Social Determinants of Health   Financial Resource Strain: Not on file  Food Insecurity: Food Insecurity Present (08/22/2022)   Hunger Vital Sign    Worried About Running Out of Food in the Last Year: Sometimes true    Ran Out of Food in the Last Year: Sometimes true  Transportation Needs: Patient Unable To Answer (08/22/2022)   PRAPARE - Transportation    Lack of Transportation (Medical): Patient unable to answer    Lack of Transportation (Non-Medical): Patient unable to answer  Recent Concern: Transportation Needs - Unmet Transportation Needs (06/13/2022)   Received from [facility]   [facility] - Transportation    Lack of Transportation (Medical): Not on file    Lack of Transportation (Non-Medical): Yes  Physical Activity: Not on file  Stress: Not on file  Social Connections: Unknown (08/06/2021)   Received from [facility], [facility]   Social Network    Social Network: Not on file   Additional Social History:  Sleep: Good  Appetite:  Good  Current Medications: Current Facility-Administered Medications  Medication Dose Route Frequency Provider Last Rate Last Admin   acetaminophen (TYLENOL) tablet 650 mg  650 mg Oral Q6H PRN [name], DO   650 mg at 10/15/22 2123   alum & mag hydroxide-simeth (MAALOX/MYLANTA) 200-200-20 MG/5ML suspension 30 mL  30 mL Oral Q4H PRN [name], DO   30 mL at 09/20/22 1142   diphenhydrAMINE (BENADRYL) capsule 50 mg  50 mg Oral Q6H PRN [name], DO   50 mg at 09/29/22 2254   Or   diphenhydrAMINE (BENADRYL) injection 50 mg  50 mg Intramuscular Q6H PRN [name], DO       haloperidol (HALDOL) tablet 5 mg  5  mg Oral Q6H PRN [name], DO   5 mg at 09/28/22 2149   Or   haloperidol lactate (HALDOL) injection 5 mg  5 mg Intramuscular Q6H PRN [name], DO       ibuprofen (ADVIL) tablet 600 mg  600 mg Oral Q6H PRN [name], [name], MD   600 mg at 10/16/22 2123   LORazepam (ATIVAN) tablet 1 mg  1 mg Oral TID PRN [name], DO   1 mg at 09/27/22 0025   magnesium hydroxide (MILK OF MAGNESIA) suspension 30 mL  30 mL Oral Daily PRN [name], DO       [START ON 11/13/2022] paliperidone (INVEGA SUSTENNA) injection 156 mg  156 mg Intramuscular Once [name], MD       paliperidone (INVEGA) 24 hr tablet 3 mg  3 mg Oral QHS [name], DO   3 mg at 10/16/22 2123   traZODone (DESYREL) tablet 50 mg  50 mg Oral QHS PRN [name], DO   50 mg at 10/16/22 2123    Lab Results: No results found for this or any previous visit (from the past 48 hour(s)).  Blood Alcohol level:  Lab Results  Component Value Date   ETH <10 08/17/2022   ETH <10 01/11/2021    Metabolic Disorder Labs: No results found for: "HGBA1C", "MPG" No results found for: "PROLACTIN" No results found for: "CHOL", "TRIG", "HDL", "CHOLHDL", "VLDL", "LDLCALC"  Physical Findings: AIMS:  , ,  ,  ,    CIWA:    COWS:     Musculoskeletal: Strength & Muscle Tone: within normal limits Gait & Station: normal Patient leans: N/A  Psychiatric Specialty Exam:  Presentation  General Appearance:  Casual; Neat  Eye Contact: Fair  Speech:  Garbled  Speech Volume: Normal  Handedness: Right   Mood and Affect  Mood: Fine  Affect: Constricted and avoidant   Thought Process  Thought Processes: Disorganized Probably at baseline. Improved since admission  Descriptions of Associations:Loose  Orientation:Partial  Thought Content:Denies SI/HI, delusional  Hallucinations:Denies , but is observed talking to self Ideas of  Reference:Paranoia  Suicidal Thoughts:Denies SI Homicidal Thoughts:Denies HI  Sensorium  Memory: Immediate Fair; Remote Poor  Judgment: Fair, pt is Rx compliant  Insight: Limited   Executive Functions  Concentration: Poor  Attention Span: Short  Language: Fair   Psychomotor Activity  Psychomotor Activity:Variable, at times decreased, and at times increased  Assets  Assets: Communication Skills   Sleep  Sleep:Improved   Physical Exam: Physical Exam Vitals and nursing note reviewed.  Constitutional:      Appearance: Normal appearance. She is normal weight.  Neurological:     General: No focal deficit present.  Mental Status: She is alert.    Review of Systems  Constitutional: Negative.   HENT: Negative.    Eyes: Negative.   Respiratory: Negative.    Cardiovascular: Negative.   Gastrointestinal: Negative.   Genitourinary: Negative.   Musculoskeletal: Negative.   Skin: Negative.   Neurological: Negative.   Endo/Heme/Allergies: Negative.   Psychiatric/Behavioral: Negative.     Blood pressure 121/69, pulse 66, temperature 98.1 F (36.7 C), resp. rate 15, height 5\' 6"  (1.676 m), weight 64 kg, SpO2 100%. Body mass index is 22.76 kg/m.   Treatment Plan Summary: Daily contact with patient to assess and evaluate symptoms and progress in treatment and Medication management  Patient received Invega Sustenna 234 mg IM on 09/27/22 Invega Sustenna 156 mg IM given, 10/15/22 to be repeated every 4 weeks. Continue on Invega 3 mg Po daily   [name], MD

## 2022-10-17 NOTE — Plan of Care (Signed)
  Problem: Pain Managment: Goal: General experience of comfort will improve Outcome: Progressing   Problem: Safety: Goal: Ability to remain free from injury will improve Outcome: Progressing   Problem: Skin Integrity: Goal: Risk for impaired skin integrity will decrease Outcome: Progressing  Patient is complaint with treatment plan denies SI/HI endorsing A/VH but stated that the voices are not bothering her at present since she is about to go to sleep. No adverse drug noted. Q 15 minutes safety checks ongoing. Patient remains safe.

## 2022-10-17 NOTE — Plan of Care (Signed)
  Problem: Nutrition: Goal: Adequate nutrition will be maintained Outcome: Progressing   Problem: Coping: Goal: Level of anxiety will decrease Outcome: Progressing   Problem: Elimination: Goal: Will not experience complications related to bowel motility Outcome: Progressing   

## 2022-10-17 NOTE — Plan of Care (Signed)
  Problem: Pain Managment: Goal: General experience of comfort will improve Outcome: Progressing   Problem: Safety: Goal: Ability to remain free from injury will improve Outcome: Progressing   Problem: Skin Integrity: Goal: Risk for impaired skin integrity will decrease Outcome: Progressing  Patient is complaint with medications no adverse effects noted. Denies SI/HI endorses A/VH and verbally contracts for safety. Visible in Milieu this shift. Q 15 minutes safety checks ongoing Patient remains safe.

## 2022-10-18 DIAGNOSIS — F250 Schizoaffective disorder, bipolar type: Secondary | ICD-10-CM | POA: Diagnosis not present

## 2022-10-18 NOTE — B[facility] Counselor (Signed)
CSW messaged Dr.Parmar to place PT/OT consult so CSW could begin process of securing a wheelchair before pt's expected discharge date of 10/21/22.   Reynaldo Minium, MSW, [place] 10/18/2022 2:49 PM

## 2022-10-18 NOTE — BH IP Treatment Plan (Signed)
Interdisciplinary Treatment and Diagnostic Plan Update  10/18/2022 Time of Session: 9:00 AM  Betty Henderson MRN: [id number]  Principal Diagnosis: Schizoaffective disorder, bipolar type (HCC)  Secondary Diagnoses: Principal Problem:   Schizoaffective disorder, bipolar type (HCC)   Current Medications:  Current Facility-Administered Medications  Medication Dose Route Frequency Provider Last Rate Last Admin   acetaminophen (TYLENOL) tablet 650 mg  650 mg Oral Q6H PRN [name], DO   650 mg at 10/17/22 1407   alum & mag hydroxide-simeth (MAALOX/MYLANTA) 200-200-20 MG/5ML suspension 30 mL  30 mL Oral Q4H PRN [name], DO   30 mL at 09/20/22 1142   diphenhydrAMINE (BENADRYL) capsule 50 mg  50 mg Oral Q6H PRN [name], DO   50 mg at 09/29/22 2254   Or   diphenhydrAMINE (BENADRYL) injection 50 mg  50 mg Intramuscular Q6H PRN [name], DO       haloperidol (HALDOL) tablet 5 mg  5 mg Oral Q6H PRN [name], DO   5 mg at 09/28/22 2149   Or   haloperidol lactate (HALDOL) injection 5 mg  5 mg Intramuscular Q6H PRN [name], DO       ibuprofen (ADVIL) tablet 600 mg  600 mg Oral Q6H PRN [name], [name], MD   600 mg at 10/17/22 2125   LORazepam (ATIVAN) tablet 1 mg  1 mg Oral TID PRN [name], DO   1 mg at 09/27/22 0025   magnesium hydroxide (MILK OF MAGNESIA) suspension 30 mL  30 mL Oral Daily PRN [name], DO       [START ON 11/13/2022] paliperidone (INVEGA SUSTENNA) injection 156 mg  156 mg Intramuscular Once [name], MD       paliperidone (INVEGA) 24 hr tablet 3 mg  3 mg Oral QHS [name], DO   3 mg at 10/17/22 2125   traZODone (DESYREL) tablet 50 mg  50 mg Oral QHS PRN [name], DO   50 mg at 10/17/22 2125   PTA Medications: Medications Prior to Admission  Medication Sig Dispense Refill Last Dose   ondansetron (ZOFRAN) 4 MG tablet Take 1  tablet (4 mg total) by mouth every 8 (eight) hours as needed for nausea or vomiting. (Patient not taking: Reported on 08/17/2022) 20 tablet 0     Patient Stressors: Financial difficulties   Health problems   Medication change or noncompliance    Patient Strengths: Ability for insight   Treatment Modalities: Medication Management, Group therapy, Case management,  1 to 1 session with clinician, Psychoeducation, Recreational therapy.   Physician Treatment Plan for Primary Diagnosis: Schizoaffective disorder, bipolar type (HCC) Long Term Goal(s): Improvement in symptoms so as ready for discharge   Short Term Goals: Ability to identify changes in lifestyle to reduce recurrence of condition will improve Ability to verbalize feelings will improve Ability to disclose and discuss suicidal ideas Ability to demonstrate self-control will improve Ability to identify and develop effective coping behaviors will improve Ability to maintain clinical measurements within normal limits will improve Compliance with prescribed medications will improve Ability to identify triggers associated with substance abuse/mental health issues will improve  Medication Management: Evaluate patient's response, side effects, and tolerance of medication regimen.  Therapeutic Interventions: 1 to 1 sessions, Unit Group sessions and Medication administration.  Evaluation of Outcomes: Progressing  Physician Treatment Plan for Secondary Diagnosis: Principal Problem:   Schizoaffective disorder, bipolar type (HCC)  Long Term Goal(s): Improvement in symptoms  so as ready for discharge   Short Term Goals: Ability to identify changes in lifestyle to reduce recurrence of condition will improve Ability to verbalize feelings will improve Ability to disclose and discuss suicidal ideas Ability to demonstrate self-control will improve Ability to identify and develop effective coping behaviors will improve Ability to maintain  clinical measurements within normal limits will improve Compliance with prescribed medications will improve Ability to identify triggers associated with substance abuse/mental health issues will improve     Medication Management: Evaluate patient's response, side effects, and tolerance of medication regimen.  Therapeutic Interventions: 1 to 1 sessions, Unit Group sessions and Medication administration.  Evaluation of Outcomes: Progressing   RN Treatment Plan for Primary Diagnosis: Schizoaffective disorder, bipolar type (HCC) Long Term Goal(s): Knowledge of disease and therapeutic regimen to maintain health will improve  Short Term Goals: Ability to remain free from injury will improve, Ability to verbalize frustration and anger appropriately will improve, Ability to demonstrate self-control, Ability to participate in decision making will improve, Ability to verbalize feelings will improve, Ability to disclose and discuss suicidal ideas, Ability to identify and develop effective coping behaviors will improve, and Compliance with prescribed medications will improve  Medication Management: RN will administer medications as ordered by provider, will assess and evaluate patient's response and provide education to patient for prescribed medication. RN will report any adverse and/or side effects to prescribing provider.  Therapeutic Interventions: 1 on 1 counseling sessions, Psychoeducation, Medication administration, Evaluate responses to treatment, Monitor vital signs and CBGs as ordered, Perform/monitor CIWA, COWS, AIMS and Fall Risk screenings as ordered, Perform wound care treatments as ordered.  Evaluation of Outcomes: Progressing   LCSW Treatment Plan for Primary Diagnosis: Schizoaffective disorder, bipolar type (HCC) Long Term Goal(s): Safe transition to appropriate next level of care at discharge, Engage patient in therapeutic group addressing interpersonal concerns.  Short Term Goals:  Engage patient in aftercare planning with referrals and resources, Increase social support, Increase ability to appropriately verbalize feelings, Increase emotional regulation, Facilitate acceptance of mental health diagnosis and concerns, Identify triggers associated with mental health/substance abuse issues, and Increase skills for wellness and recovery  Therapeutic Interventions: Assess for all discharge needs, 1 to 1 time with Social worker, Explore available resources and support systems, Assess for adequacy in community support network, Educate family and significant other(s) on suicide prevention, Complete Psychosocial Assessment, Interpersonal group therapy.  Evaluation of Outcomes: Progressing   Progress in Treatment: Attending groups: Yes. Participating in groups: Yes. Taking medication as prescribed: Yes. Toleration medication: Yes. Family/Significant other contact made: Nanami Haegele, son, [phone] Patient understands diagnosis: Yes. Discussing patient identified problems/goals with staff: Yes. Medical problems stabilized or resolved: Yes. Denies suicidal/homicidal ideation: Yes. Issues/concerns per patient self-inventory: No. Other: None   New Short Term/Long Term Goal(s): Update 6/30: none at this time. Update 09/27/2022:  No changes at this time.  Update 10/02/2022:  No changes at this time. Update 10/07/22: No changes at this time 10/12/22: No changes at this time Update 10/18/22: No changes at this time      Patient Goals:  Update 6/30: none at this time. Update 09/27/2022:  No changes at this time. Update 10/02/2022:  No changes at this time. Update 10/07/22: No changes at this time 10/12/22: No changes at this time Update 10/18/22: No changes at this time     Discharge Plan or Barriers: Update 6/30: APS report has been made and patient is being investigated for guardianship needs.  Remains homeless with limited supports. Update  09/27/2022:  No changes at this time.  Update  10/02/2022:  Patient remains safe on the unit at this time.  Patient remains psychotic at this time.  APS report has been made, however, no follow up from the caseworker on her case.  No safe discharge identified.  CSW has requested that application for Medicaid and disability be completed.   Update 10/07/22: No changes at this time 10/12/22: No changes at this time Update 10/18/22: No changes at this time    Reason for Continuation of Hospitalization: Hallucinations Mania Medication stabilization   Estimated Length of Stay:  Update 6/30: 1-7 days Update 09/27/2022:  TBD Update 10/02/2022:  No changes at this time. UpdaUpdate 10/18/22: No changes at this time te 10/07/22: No changes at this time Update 10/18/22: No changes at this time   Last 3 [place] Suicide Severity Risk Score: Flowsheet Row Admission (Current) from 08/22/2022 in [facility] ED from 08/17/2022 in [facility] Emergency Department at [facility] ED from 08/12/2022 in [facility] Emergency Department at [facility]  C-SSRS RISK CATEGORY Low Risk No Risk No Risk       Last [facility] 2/9 Scores:     No data to display          Scribe for Treatment Team: Elza Rafter, Theresia Majors 10/18/2022 3:26 PM

## 2022-10-18 NOTE — Plan of Care (Signed)
  Problem: Safety: Goal: Ability to remain free from injury will improve Outcome: Progressing   Problem: Skin Integrity: Goal: Risk for impaired skin integrity will decrease Outcome: Progressing   Problem: Pain Managment: Goal: General experience of comfort will improve Outcome: Not Progressing

## 2022-10-18 NOTE — Progress Notes (Signed)
[facility] MD Progress Note  10/18/2022  Betty Henderson  MRN:  [id number] Subjective: Case discussed with RN and social worker, chart reviewed, patient seen today during rounds.  Per staff report patient is doing fine on the unit.  No acute events reported overnight.  Patient is tolerating medicine fine without any side effects.  Patient is occasionally observed talking to self in her room although she continues to denies auditory hallucinations. . Patient denies any thoughts of harming herself or others.  Social worker is working on placement.    Principal Problem: Schizoaffective disorder, bipolar type (HCC) Diagnosis: Principal Problem:   Schizoaffective disorder, bipolar type (HCC)  Past Psychiatric History: Schizoaffective disorder bipolar type  Past Medical History:  Past Medical History:  Diagnosis Date   Anemia    Arthritis    Chronic pain    Drug-seeking behavior    Malingering    Osteopetrosis    Psychosis (HCC)    Schizoaffective disorder, bipolar type (HCC)     Past Surgical History:  Procedure Laterality Date   MOUTH SURGERY     TUBAL LIGATION     Family History:  Family History  Family history unknown: Yes   Family Psychiatric  History: Unremarkable Social History:  Social History   Substance and Sexual Activity  Alcohol Use Not Currently   Comment: 1 cocktail 3 weeks ago     Social History   Substance and Sexual Activity  Drug Use Not Currently   Types: Cocaine   Comment: states "it's legal though"    Social History   Socioeconomic History   Marital status: Single    Spouse name: Not on file   Number of children: Not on file   Years of education: Not on file   Highest education level: Not on file  Occupational History   Not on file  Tobacco Use   Smoking status: Every Day    Current packs/day: 0.50    Types: Cigarettes   Smokeless tobacco: Never  Vaping Use   Vaping status: Never Used  Substance and Sexual Activity   Alcohol use: Not  Currently    Comment: 1 cocktail 3 weeks ago   Drug use: Not Currently    Types: Cocaine    Comment: states "it's legal though"   Sexual activity: Never  Other Topics Concern   Not on file  Social History Narrative   Not on file   Social Determinants of Health   Financial Resource Strain: Not on file  Food Insecurity: Food Insecurity Present (08/22/2022)   Hunger Vital Sign    Worried About Running Out of Food in the Last Year: Sometimes true    Ran Out of Food in the Last Year: Sometimes true  Transportation Needs: Patient Unable To Answer (08/22/2022)   PRAPARE - Transportation    Lack of Transportation (Medical): Patient unable to answer    Lack of Transportation (Non-Medical): Patient unable to answer  Recent Concern: Transportation Needs - Unmet Transportation Needs (06/13/2022)   Received from [facility]   [facility] - Transportation    Lack of Transportation (Medical): Not on file    Lack of Transportation (Non-Medical): Yes  Physical Activity: Not on file  Stress: Not on file  Social Connections: Unknown (08/06/2021)   Received from [facility], [facility]   Social Network    Social Network: Not on file   Additional Social History:  Sleep: Good  Appetite:  Good  Current Medications: Current Facility-Administered Medications  Medication Dose Route Frequency Provider Last Rate Last Admin   acetaminophen (TYLENOL) tablet 650 mg  650 mg Oral Q6H PRN [name], DO   650 mg at 10/17/22 1407   alum & mag hydroxide-simeth (MAALOX/MYLANTA) 200-200-20 MG/5ML suspension 30 mL  30 mL Oral Q4H PRN [name], DO   30 mL at 09/20/22 1142   diphenhydrAMINE (BENADRYL) capsule 50 mg  50 mg Oral Q6H PRN [name], DO   50 mg at 09/29/22 2254   Or   diphenhydrAMINE (BENADRYL) injection 50 mg  50 mg Intramuscular Q6H PRN [name], DO       haloperidol (HALDOL) tablet 5 mg  5  mg Oral Q6H PRN [name], DO   5 mg at 09/28/22 2149   Or   haloperidol lactate (HALDOL) injection 5 mg  5 mg Intramuscular Q6H PRN [name], DO       ibuprofen (ADVIL) tablet 600 mg  600 mg Oral Q6H PRN [name], [name], MD   600 mg at 10/17/22 2125   LORazepam (ATIVAN) tablet 1 mg  1 mg Oral TID PRN [name], DO   1 mg at 09/27/22 0025   magnesium hydroxide (MILK OF MAGNESIA) suspension 30 mL  30 mL Oral Daily PRN [name], DO       [START ON 11/13/2022] paliperidone (INVEGA SUSTENNA) injection 156 mg  156 mg Intramuscular Once [name], MD       paliperidone (INVEGA) 24 hr tablet 3 mg  3 mg Oral QHS [name], DO   3 mg at 10/17/22 2125   traZODone (DESYREL) tablet 50 mg  50 mg Oral QHS PRN [name], DO   50 mg at 10/17/22 2125    Lab Results: No results found for this or any previous visit (from the past 48 hour(s)).  Blood Alcohol level:  Lab Results  Component Value Date   ETH <10 08/17/2022   ETH <10 01/11/2021    Metabolic Disorder Labs: No results found for: "HGBA1C", "MPG" No results found for: "PROLACTIN" No results found for: "CHOL", "TRIG", "HDL", "CHOLHDL", "VLDL", "LDLCALC"  Physical Findings: AIMS:  , ,  ,  ,    CIWA:    COWS:     Musculoskeletal: Strength & Muscle Tone: within normal limits Gait & Station: normal Patient leans: N/A  Psychiatric Specialty Exam:  Presentation  General Appearance:  Casual; Neat  Eye Contact: Fair  Speech:  Garbled  Speech Volume: Normal  Handedness: Right   Mood and Affect  Mood: Fine  Affect: Constricted and avoidant   Thought Process  Thought Processes: Disorganized Probably at baseline. Improved since admission  Descriptions of Associations:Loose  Orientation:Partial  Thought Content:Denies SI/HI, delusional  Hallucinations:Denies , but is observed talking to self Ideas of  Reference:Paranoia  Suicidal Thoughts:Denies SI Homicidal Thoughts:Denies HI  Sensorium  Memory: Immediate Fair; Remote Poor  Judgment: Fair, pt is Rx compliant  Insight: Limited   Executive Functions  Concentration: Poor  Attention Span: Short  Language: Fair   Psychomotor Activity  Psychomotor Activity:Variable, at times decreased, and at times increased  Assets  Assets: Communication Skills   Sleep  Sleep:Improved   Physical Exam: Physical Exam Vitals and nursing note reviewed.  Constitutional:      Appearance: Normal appearance. She is normal weight.  Neurological:     General: No focal deficit present.  Mental Status: She is alert.    Review of Systems  Constitutional: Negative.   HENT: Negative.    Eyes: Negative.   Respiratory: Negative.    Cardiovascular: Negative.   Gastrointestinal: Negative.   Genitourinary: Negative.   Musculoskeletal: Negative.   Skin: Negative.   Neurological: Negative.   Endo/Heme/Allergies: Negative.   Psychiatric/Behavioral: Negative.     Blood pressure 116/71, pulse 69, temperature 98.3 F (36.8 C), resp. rate 18, height 5\' 6"  (1.676 m), weight 64 kg, SpO2 100%. Body mass index is 22.76 kg/m.   Treatment Plan Summary: Daily contact with patient to assess and evaluate symptoms and progress in treatment and Medication management  Patient received Invega Sustenna 234 mg IM on 09/27/22 Invega Sustenna 156 mg IM given, 10/15/22 to be repeated every 4 weeks. Continue on Invega 3 mg Po daily   [name], MD

## 2022-10-18 NOTE — Progress Notes (Signed)
Patient was admitted involuntarily on Aug 22, 2022 for worsening psychosis with a dx of schizoaffective disorder. She is now a voluntary admission She denies SI/HI/AVH. She denies anxiety and depression.   No complaints of pain. No PRN's adm. She attended group and was actively engaged.  Q15 minute unit checks in place.

## 2022-10-18 NOTE — BHH Counselor (Signed)
Aftercare/Discharge plan   Patient currently has no funds to aid in appropriate and safe discharge planning. Current psychiatrists agree that if discharged to a homeless shelter, pt will be unable to care for herself.   From CSW's current knowledge, and according to pt's son, she has no family willing to take her in.   CSW's referred pt to [facility] financial counseling.   Currently, financial counselor [name] at [facility] is filling out for patient's SS/SSDI and Medicaid applications as pt has no funds.   CSW's [place] and [place] submitted an APS report on 09/19/22 and must wait for DSS to complete their evaluation and decision on pt's behalf.   Reynaldo Minium, MSW, [place] 10/18/2022 2:58 PM

## 2022-10-18 NOTE — Group Note (Signed)
Recreation Therapy Group Note   Group Topic:Leisure Education  Group Date: 10/18/2022 Start Time: 1425 End Time: 1525 Facilitators: Rosina Lowenstein, LRT, CTRS Location:  Day Room  Group Description: Leisure. Patients were given the option to choose from coloring mandalas, singing karaoke, playing with cards, or listening to music. LRT and pts discussed the meaning of leisure, the importance of participating in leisure during their free time/when they're outside of the hospital, as well as how our leisure interests can also serve as coping skills. Pt identified two leisure interests and shared with the group.   Goal Area(s) Addressed:  Patient will identify a current leisure interest.  Patient will learn the definition of "leisure". Patient will practice making a positive decision. Patient will have the opportunity to try a new leisure activity. Patient will communicate with peers and LRT.    Affect/Mood: Appropriate   Participation Level: Active and Engaged   Participation Quality: Independent   Behavior: Appropriate, Calm, and Cooperative   Speech/Thought Process: Coherent   Insight: Good   Judgement: Good   Modes of Intervention: Activity   Patient Response to Interventions:  Attentive, Engaged, Interested , and Receptive   Education Outcome:  Acknowledges education   Clinical Observations/Individualized Feedback: Miyonna was active in their participation of session activities and group discussion. Pt identified "sleep, shopping and make up" as things she does in her free time. Pt chose to sing karaoke while in group. Pt interacted well with LRT and peers duration of session.   Plan: Continue to engage patient in RT group sessions 2-3x/week.   Rosina Lowenstein, LRT, CTRS 10/18/2022 3:30 PM

## 2022-10-19 DIAGNOSIS — F250 Schizoaffective disorder, bipolar type: Secondary | ICD-10-CM | POA: Diagnosis not present

## 2022-10-19 NOTE — Plan of Care (Signed)
Calm and cooperative. Po med compliant. No behavior issues noted. Q 15 min checks maintained for safety. Denies SI/HI/AV/H. No c/o pain/discomfort noted   Problem: Nutrition: Goal: Adequate nutrition will be maintained Outcome: Progressing   Problem: Coping: Goal: Level of anxiety will decrease Outcome: Progressing   Problem: Elimination: Goal: Will not experience complications related to bowel motility Outcome: Progressing Goal: Will not experience complications related to urinary retention Outcome: Progressing   Problem: Pain Managment: Goal: General experience of comfort will improve Outcome: Progressing   Problem: Safety: Goal: Ability to remain free from injury will improve Outcome: Progressing

## 2022-10-19 NOTE — Progress Notes (Signed)
[facility] MD Progress Note  10/19/2022  Betty Henderson  MRN:  [id number] Subjective: Case discussed with RN and social worker, chart reviewed, patient seen today during rounds.  Per staff report patient is doing fine on the unit.  No acute events reported overnight.  Patient is occasionally observed talking to self in her room although she continues to denies auditory hallucinations. . Patient denies any thoughts of harming herself or others.  Social worker is working on placement.    Principal Problem: Schizoaffective disorder, bipolar type (HCC) Diagnosis: Principal Problem:   Schizoaffective disorder, bipolar type (HCC)  Past Psychiatric History: Schizoaffective disorder bipolar type  Past Medical History:  Past Medical History:  Diagnosis Date   Anemia    Arthritis    Chronic pain    Drug-seeking behavior    Malingering    Osteopetrosis    Psychosis (HCC)    Schizoaffective disorder, bipolar type (HCC)     Past Surgical History:  Procedure Laterality Date   MOUTH SURGERY     TUBAL LIGATION     Family History:  Family History  Family history unknown: Yes   Family Psychiatric  History: Unremarkable Social History:  Social History   Substance and Sexual Activity  Alcohol Use Not Currently   Comment: 1 cocktail 3 weeks ago     Social History   Substance and Sexual Activity  Drug Use Not Currently   Types: Cocaine   Comment: states "it's legal though"    Social History   Socioeconomic History   Marital status: Single    Spouse name: Not on file   Number of children: Not on file   Years of education: Not on file   Highest education level: Not on file  Occupational History   Not on file  Tobacco Use   Smoking status: Every Day    Current packs/day: 0.50    Types: Cigarettes   Smokeless tobacco: Never  Vaping Use   Vaping status: Never Used  Substance and Sexual Activity   Alcohol use: Not Currently    Comment: 1 cocktail 3 weeks ago   Drug use: Not  Currently    Types: Cocaine    Comment: states "it's legal though"   Sexual activity: Never  Other Topics Concern   Not on file  Social History Narrative   Not on file   Social Determinants of Health   Financial Resource Strain: Not on file  Food Insecurity: Food Insecurity Present (08/22/2022)   Hunger Vital Sign    Worried About Running Out of Food in the Last Year: Sometimes true    Ran Out of Food in the Last Year: Sometimes true  Transportation Needs: Patient Unable To Answer (08/22/2022)   PRAPARE - Transportation    Lack of Transportation (Medical): Patient unable to answer    Lack of Transportation (Non-Medical): Patient unable to answer  Recent Concern: Transportation Needs - Unmet Transportation Needs (06/13/2022)   Received from [facility], [facility]   [facility] - Transportation    Lack of Transportation (Medical): Not on file    Lack of Transportation (Non-Medical): Yes  Physical Activity: Not on file  Stress: Not on file  Social Connections: Unknown (08/06/2021)   Received from [facility], [facility]   Social Network    Social Network: Not on file   Additional Social History:                         Sleep: Good  Appetite:  Good  Current Medications: Current Facility-Administered Medications  Medication Dose Route Frequency Provider Last Rate Last Admin   acetaminophen (TYLENOL) tablet 650 mg  650 mg Oral Q6H PRN [name], DO   650 mg at 10/17/22 1407   alum & mag hydroxide-simeth (MAALOX/MYLANTA) 200-200-20 MG/5ML suspension 30 mL  30 mL Oral Q4H PRN [name], DO   30 mL at 09/20/22 1142   diphenhydrAMINE (BENADRYL) capsule 50 mg  50 mg Oral Q6H PRN [name], DO   50 mg at 09/29/22 2254   Or   diphenhydrAMINE (BENADRYL) injection 50 mg  50 mg Intramuscular Q6H PRN [name], DO       haloperidol (HALDOL) tablet 5 mg  5 mg Oral Q6H PRN [name], DO   5 mg at 09/28/22  2149   Or   haloperidol lactate (HALDOL) injection 5 mg  5 mg Intramuscular Q6H PRN [name], DO       ibuprofen (ADVIL) tablet 600 mg  600 mg Oral Q6H PRN [name], [name], MD   600 mg at 10/17/22 2125   LORazepam (ATIVAN) tablet 1 mg  1 mg Oral TID PRN [name], DO   1 mg at 09/27/22 0025   magnesium hydroxide (MILK OF MAGNESIA) suspension 30 mL  30 mL Oral Daily PRN [name], DO       [START ON 11/13/2022] paliperidone (INVEGA SUSTENNA) injection 156 mg  156 mg Intramuscular Once [name], MD       paliperidone (INVEGA) 24 hr tablet 3 mg  3 mg Oral QHS [name], DO   3 mg at 10/18/22 2137   traZODone (DESYREL) tablet 50 mg  50 mg Oral QHS PRN [name], DO   50 mg at 10/18/22 2137    Lab Results: No results found for this or any previous visit (from the past 48 hour(s)).  Blood Alcohol level:  Lab Results  Component Value Date   ETH <10 08/17/2022   ETH <10 01/11/2021    Metabolic Disorder Labs: No results found for: "HGBA1C", "MPG" No results found for: "PROLACTIN" No results found for: "CHOL", "TRIG", "HDL", "CHOLHDL", "VLDL", "LDLCALC"  Physical Findings: AIMS:  , ,  ,  ,    CIWA:    COWS:     Musculoskeletal: Strength & Muscle Tone: within normal limits Gait & Station: normal Patient leans: N/A  Psychiatric Specialty Exam:  Presentation  General Appearance:  Casual; Neat  Eye Contact: Fair  Speech:  Garbled  Speech Volume: Normal  Handedness: Right   Mood and Affect  Mood: Fine  Affect: Constricted and avoidant   Thought Process  Thought Processes: Disorganized Probably at baseline. Improved since admission  Descriptions of Associations:Loose  Orientation:Partial  Thought Content:Denies SI/HI, delusional  Hallucinations:Denies , but is observed talking to self Ideas of Reference:Paranoia  Suicidal Thoughts:Denies SI Homicidal Thoughts:Denies  HI  Sensorium  Memory: Immediate Fair; Remote Poor  Judgment: Fair, pt is Rx compliant  Insight: Limited   Executive Functions  Concentration: Poor  Attention Span: Short  Language: Fair   Psychomotor Activity  Psychomotor Activity:Variable, at times decreased, and at times increased  Assets  Assets: Communication Skills   Sleep  Sleep:Improved   Physical Exam: Physical Exam Vitals and nursing note reviewed.  Constitutional:      Appearance: Normal appearance. She is normal weight.  Neurological:     General: No focal deficit present.  Mental Status: She is alert.    Review of Systems  Constitutional: Negative.   HENT: Negative.    Eyes: Negative.   Respiratory: Negative.    Cardiovascular: Negative.   Gastrointestinal: Negative.   Genitourinary: Negative.   Musculoskeletal: Negative.   Skin: Negative.   Neurological: Negative.   Endo/Heme/Allergies: Negative.   Psychiatric/Behavioral: Negative.     Blood pressure (!) 100/52, pulse 63, temperature 98.2 F (36.8 C), resp. rate 16, height 5\' 6"  (1.676 m), weight 64 kg, SpO2 98%. Body mass index is 22.76 kg/m.   Treatment Plan Summary: Daily contact with patient to assess and evaluate symptoms and progress in treatment and Medication management  Patient received Invega Sustenna 234 mg IM on 09/27/22 Invega Sustenna 156 mg IM given, 10/15/22 to be repeated every 4 weeks. Continue on Invega 3 mg Po daily   [name], MD

## 2022-10-19 NOTE — Group Note (Signed)
Date:  10/19/2022 Time:  10:30 AM  Group Topic/Focus:  Rediscovering Joy:   The focus of this group is to explore various ways to relieve stress in a positive manner.    Participation Level:  Active  Participation Quality:  Appropriate  Affect:  Appropriate  Cognitive:  Appropriate and Confused  Insight: Good and Improving  Engagement in Group:  Improving  Modes of Intervention:  Discussion  Additional Comments:    [name] 10/19/2022, 10:30 AM

## 2022-10-19 NOTE — Plan of Care (Signed)
  Problem: Coping: Goal: Level of anxiety will decrease Outcome: Progressing   Problem: Elimination: Goal: Will not experience complications related to bowel motility Outcome: Progressing Goal: Will not experience complications related to urinary retention Outcome: Progressing   

## 2022-10-19 NOTE — Progress Notes (Signed)
Patient was admitted involuntarily on Aug 22, 2022 for worsening psychosis with a dx of schizoaffective disorder. She is currently a voluntary admission. Affect flat. Mood preoccupied. She denies SI/HI/AVH. She denies anxiety and depression. She is responding to internal stimuli regularly when she is in her room, but no unsafe behaviors noted. She actively participated in group and was cooperative throughout the shift.  No scheduled medications adm on this shift. No PRN's requested. Denies pain. Appetite good.  Discharge planning ongoing. Q15 minute unit checks in place.

## 2022-10-20 DIAGNOSIS — F250 Schizoaffective disorder, bipolar type: Secondary | ICD-10-CM | POA: Diagnosis not present

## 2022-10-20 NOTE — Progress Notes (Signed)
Patient denies SI, HI, and AVH this shift. Patient has been isolative to self. Patient has been compliant with medications, attended groups and had had no incident of behavioral dyscontrol.   Assess patient for safety, offer medications as prescribed, engage patient in 1:1 staff talks.   Patient able to contract for safety. Continue to monitor as planned.

## 2022-10-20 NOTE — Group Note (Signed)
Date:  10/20/2022 Time:  10:35 AM  Group Topic/Focus:  Coping With Mental Health Crisis:   The purpose of this group is to help patients identify strategies for coping with mental health crisis.  Group discusses possible causes of crisis and ways to manage them effectively. Goals Group:   The focus of this group is to help patients establish daily goals to achieve during treatment and discuss how the patient can incorporate goal setting into their daily lives to aide in recovery.    Participation Level:  Did Not Attend  Participation Quality:   Affect:    Cognitive:    Insight:   Engagement in Group:    Modes of Intervention:    Additional Comments:    Mahamud Metts L Rashawn Rayman 10/20/2022, 10:35 AM

## 2022-10-20 NOTE — Progress Notes (Signed)
[facility] MD Progress Note  10/20/2022  Betty Henderson  MRN:  [id number]  Subjective: Case discussed with RN and social worker, chart reviewed, patient seen today during rounds.  Per staff report patient is doing fine on the unit.  No acute events reported overnight.  Patient continues to respond to internal stimuli, although it is less frequent as compared to past few weeks.  Patient denies any thoughts of harming herself or others.  Social worker is working on placement.    Principal Problem: Schizoaffective disorder, bipolar type (HCC) Diagnosis: Principal Problem:   Schizoaffective disorder, bipolar type (HCC)  Past Psychiatric History: Schizoaffective disorder bipolar type  Past Medical History:  Past Medical History:  Diagnosis Date   Anemia    Arthritis    Chronic pain    Drug-seeking behavior    Malingering    Osteopetrosis    Psychosis (HCC)    Schizoaffective disorder, bipolar type (HCC)     Past Surgical History:  Procedure Laterality Date   MOUTH SURGERY     TUBAL LIGATION     Family History:  Family History  Family history unknown: Yes   Family Psychiatric  History: Unremarkable Social History:  Social History   Substance and Sexual Activity  Alcohol Use Not Currently   Comment: 1 cocktail 3 weeks ago     Social History   Substance and Sexual Activity  Drug Use Not Currently   Types: Cocaine   Comment: states "it's legal though"    Social History   Socioeconomic History   Marital status: Single    Spouse name: Not on file   Number of children: Not on file   Years of education: Not on file   Highest education level: Not on file  Occupational History   Not on file  Tobacco Use   Smoking status: Every Day    Current packs/day: 0.50    Types: Cigarettes   Smokeless tobacco: Never  Vaping Use   Vaping status: Never Used  Substance and Sexual Activity   Alcohol use: Not Currently    Comment: 1 cocktail 3 weeks ago   Drug use: Not Currently     Types: Cocaine    Comment: states "it's legal though"   Sexual activity: Never  Other Topics Concern   Not on file  Social History Narrative   Not on file   Social Determinants of Health   Financial Resource Strain: Not on file  Food Insecurity: Food Insecurity Present (08/22/2022)   Hunger Vital Sign    Worried About Running Out of Food in the Last Year: Sometimes true    Ran Out of Food in the Last Year: Sometimes true  Transportation Needs: Patient Unable To Answer (08/22/2022)   PRAPARE - Transportation    Lack of Transportation (Medical): Patient unable to answer    Lack of Transportation (Non-Medical): Patient unable to answer  Recent Concern: Transportation Needs - Unmet Transportation Needs (06/13/2022)   Received from [facility], [facility]   [facility] - Transportation    Lack of Transportation (Medical): Not on file    Lack of Transportation (Non-Medical): Yes  Physical Activity: Not on file  Stress: Not on file  Social Connections: Unknown (08/06/2021)   Received from [facility], [facility]   Social Network    Social Network: Not on file   Additional Social History:  Sleep: Good  Appetite:  Good  Current Medications: Current Facility-Administered Medications  Medication Dose Route Frequency Provider Last Rate Last Admin   acetaminophen (TYLENOL) tablet 650 mg  650 mg Oral Q6H PRN [name], DO   650 mg at 10/17/22 1407   alum & mag hydroxide-simeth (MAALOX/MYLANTA) 200-200-20 MG/5ML suspension 30 mL  30 mL Oral Q4H PRN [name], DO   30 mL at 09/20/22 1142   diphenhydrAMINE (BENADRYL) capsule 50 mg  50 mg Oral Q6H PRN [name], DO   50 mg at 09/29/22 2254   Or   diphenhydrAMINE (BENADRYL) injection 50 mg  50 mg Intramuscular Q6H PRN [name], DO       haloperidol (HALDOL) tablet 5 mg  5 mg Oral Q6H PRN [name], DO   5 mg at 09/28/22 2149   Or    haloperidol lactate (HALDOL) injection 5 mg  5 mg Intramuscular Q6H PRN [name], DO       ibuprofen (ADVIL) tablet 600 mg  600 mg Oral Q6H PRN [name], [name], MD   600 mg at 10/17/22 2125   LORazepam (ATIVAN) tablet 1 mg  1 mg Oral TID PRN [name], DO   1 mg at 09/27/22 0025   magnesium hydroxide (MILK OF MAGNESIA) suspension 30 mL  30 mL Oral Daily PRN [name], DO       [START ON 11/13/2022] paliperidone (INVEGA SUSTENNA) injection 156 mg  156 mg Intramuscular Once [name], MD       paliperidone (INVEGA) 24 hr tablet 3 mg  3 mg Oral QHS [name], DO   3 mg at 10/19/22 2121   traZODone (DESYREL) tablet 50 mg  50 mg Oral QHS PRN [name], DO   50 mg at 10/19/22 2121    Lab Results: No results found for this or any previous visit (from the past 48 hour(s)).  Blood Alcohol level:  Lab Results  Component Value Date   ETH <10 08/17/2022   ETH <10 01/11/2021    Metabolic Disorder Labs: No results found for: "HGBA1C", "MPG" No results found for: "PROLACTIN" No results found for: "CHOL", "TRIG", "HDL", "CHOLHDL", "VLDL", "LDLCALC"  Physical Findings: AIMS:  , ,  ,  ,    CIWA:    COWS:     Musculoskeletal: Strength & Muscle Tone: within normal limits Gait & Station: normal Patient leans: N/A  Psychiatric Specialty Exam:  Presentation  General Appearance:  Casual; Neat  Eye Contact: Fair  Speech:  Garbled  Speech Volume: Normal  Handedness: Right   Mood and Affect  Mood: Fine  Affect: More animated   Thought Process  Thought Processes: Disorganized Probably at baseline. Improved since admission  Descriptions of Associations:Loose  Orientation:Partial  Thought Content:Denies SI/HI, delusional  Hallucinations:Denies , but is observed talking to self Ideas of Reference:Paranoia  Suicidal Thoughts:Denies SI Homicidal Thoughts:Denies HI  Sensorium  Memory: Immediate  Fair; Remote Poor  Judgment: Fair, pt is Rx compliant  Insight: Limited   Executive Functions  Concentration: Decreased  Attention Span: Short  Language: Fair   Psychomotor Activity  Psychomotor Activity:Variable, at times decreased, and at times increased  Assets  Assets: Communication Skills   Sleep  Sleep:Improved   Physical Exam: Physical Exam Vitals and nursing note reviewed.  Constitutional:      Appearance: Normal appearance. She is normal weight.  Neurological:     General: No focal deficit present.  Mental Status: She is alert.    Review of Systems  Constitutional: Negative.   HENT: Negative.    Eyes: Negative.   Respiratory: Negative.    Cardiovascular: Negative.   Gastrointestinal: Negative.   Genitourinary: Negative.   Musculoskeletal: Negative.   Skin: Negative.   Neurological: Negative.   Endo/Heme/Allergies: Negative.   Psychiatric/Behavioral: Negative.     Blood pressure 101/67, pulse 84, temperature (!) 97.2 F (36.2 C), resp. rate 16, height 5\' 6"  (1.676 m), weight 64 kg, SpO2 98%. Body mass index is 22.76 kg/m.   Treatment Plan Summary: Daily contact with patient to assess and evaluate symptoms and progress in treatment and Medication management  Patient received Invega Sustenna 234 mg IM on 09/27/22 Invega Sustenna 156 mg IM given, 10/15/22 to be repeated every 4 weeks. Continue on Invega 3 mg Po daily   [name], MD

## 2022-10-20 NOTE — Plan of Care (Signed)
Calm and cooperative. Pt remains stable. Showered. Po med compliant. No behavior issues noted. Q 15 min checks provided. Denies SI/HI/AVH. No c/o pain/discomfort noted.   Problem: Coping: Goal: Level of anxiety will decrease Outcome: Progressing   Problem: Elimination: Goal: Will not experience complications related to bowel motility Outcome: Progressing Goal: Will not experience complications related to urinary retention Outcome: Progressing   Problem: Pain Managment: Goal: General experience of comfort will improve Outcome: Progressing   Problem: Safety: Goal: Ability to remain free from injury will improve Outcome: Progressing

## 2022-10-20 NOTE — Group Note (Addendum)
LCSW Group Therapy Note  Group Date: 10/20/2022 Start Time: 1320 End Time: 1350   Type of Therapy and Topic:  Group Therapy -  Coping Skills  Participation Level:  Active   Description of Group The focus of this group was to determine what unhealthy coping techniques typically are used by group members and what healthy coping techniques would be helpful in coping with various problems. Patients were guided in becoming aware of the differences between healthy and unhealthy coping techniques. Patients were asked to identify 2-3 healthy coping skills they would like to learn to use more effectively.  Therapeutic Goals Patients learned that coping is what human beings do all day long to deal with various situations in their lives Patients defined and discussed healthy vs unhealthy coping techniques Patients identified their preferred coping techniques and identified whether these were healthy or unhealthy Patients determined 2-3 healthy coping skills they would like to become more familiar with and use more often. Patients provided support and ideas to each other   Summary of Patient Progress:  The patient attended group. The patient participated in the icebreaker stating that a gift she will never forget is a ring she received. The patient won both rounds of Health visitor. The patient stated that talking to someone is the coping skill that works best for her.     Marshell Levan, LCSWA 10/20/2022  2:05 PM

## 2022-10-20 NOTE — Group Note (Signed)
Date:  10/20/2022 Time:  2:35 PM  Group Topic/Focus:  Coping With Mental Health Crisis:   The purpose of this group is to help patients identify strategies for coping with mental health crisis.  Group discusses possible causes of crisis and ways to manage them effectively.  We went outside for music therapy and fresh air! We also discussed different coping mechanisms and ways to reduce stress.   Participation Level:  Active  Participation Quality:  Appropriate  Affect:  Appropriate  Cognitive:  Appropriate  Insight: Appropriate  Engagement in Group:  Engaged  Modes of Intervention:  Discussion  Additional Comments:     Oris Staffieri L April Carlyon 10/20/2022, 2:35 PM

## 2022-10-21 DIAGNOSIS — F250 Schizoaffective disorder, bipolar type: Secondary | ICD-10-CM | POA: Diagnosis not present

## 2022-10-21 NOTE — Group Note (Signed)
Date:  10/21/2022 Time:  11:11 PM  Group Topic/Focus:  Building Self Esteem:   The Focus of this group is helping patients become aware of the effects of self-esteem on their lives, the things they and others do that enhance or undermine their self-esteem, seeing the relationship between their level of self-esteem and the choices they make and learning ways to enhance self-esteem. Coping With Mental Health Crisis:   The purpose of this group is to help patients identify strategies for coping with mental health crisis.  Group discusses possible causes of crisis and ways to manage them effectively. Developing a Wellness Toolbox:   The focus of this group is to help patients develop a "wellness toolbox" with skills and strategies to promote recovery upon discharge. Goals Group:   The focus of this group is to help patients establish daily goals to achieve during treatment and discuss how the patient can incorporate goal setting into their daily lives to aide in recovery. Healthy Communication:   The focus of this group is to discuss communication, barriers to communication, as well as healthy ways to communicate with others. Making Healthy Choices:   The focus of this group is to help patients identify negative/unhealthy choices they were using prior to admission and identify positive/healthier coping strategies to replace them upon discharge.    Participation Level:  Active  Participation Quality:  Appropriate  Affect:  Appropriate  Cognitive:  Appropriate  Insight: Appropriate  Engagement in Group:  Engaged  Modes of Intervention:  Discussion  Additional Comments:    Betty Henderson 10/21/2022, 11:11 PM

## 2022-10-21 NOTE — Progress Notes (Signed)
   10/21/22 2200  Psych Admission Type (Psych Patients Only)  Admission Status Voluntary  Psychosocial Assessment  Patient Complaints None  Eye Contact Brief  Facial Expression Flat  Affect Flat  Speech Slurred  Interaction Minimal  Motor Activity Shuffling;Slow  Appearance/Hygiene Layered clothes  Behavior Characteristics Cooperative  Mood Pleasant;Preoccupied  Thought Process  Coherency Disorganized  Content Preoccupation  Delusions None reported or observed  Perception Hallucinations  Hallucination Auditory  Judgment Impaired  Confusion Mild  Danger to Self  Current suicidal ideation? Denies  Agreement Not to Harm Self Yes  Danger to Others  Danger to Others None reported or observed  Danger to Others Abnormal  Harmful Behavior to others No threats or harm toward other people  Destructive Behavior No threats or harm toward property

## 2022-10-21 NOTE — Group Note (Signed)
Date:  10/21/2022 Time:  11:24 AM  Group Topic/Focus:  Dimensions of Wellness:   The focus of this group is to introduce the topic of wellness and discuss the role each dimension of wellness plays in total health.    Participation Level:  Active  Participation Quality:  Appropriate, Attentive, Sharing, and Supportive  Affect:  Appropriate and Excited  Cognitive:  Alert and Appropriate  Insight: Appropriate  Engagement in Group:  Supportive  Modes of Intervention:  Rapport Building and Socialization  Additional Comments:    Betty Henderson 10/21/2022, 11:24 AM

## 2022-10-21 NOTE — Group Note (Signed)
Date:  10/21/2022 Time:  7:07 PM  Group Topic/Focus:  MUSIC THERAPY    Participation Level:  Active  Participation Quality:  Appropriate  Affect:  Appropriate  Cognitive:  Appropriate  Insight: Appropriate  Engagement in Group:  Engaged and Supportive  Modes of Intervention:  Rapport Building and Socialization  Additional Comments:    Yuval Rubens 10/21/2022, 7:07 PM

## 2022-10-21 NOTE — Group Note (Signed)
Date:  10/21/2022 Time:  5:50 AM  Group Topic/Focus:   Building Self Esteem:   The Focus of this group is helping patients become aware of the effects of self-esteem on their lives, the things they and others do that enhance or undermine their self-esteem, seeing the relationship between their level of self-esteem and the choices they make and learning ways to enhance self-esteem. Coping With Mental Health Crisis:   The purpose of this group is to help patients identify strategies for coping with mental health crisis.  Group discusses possible causes of crisis and ways to manage them effectively. Goals Group:   The focus of this group is to help patients establish daily goals to achieve during treatment and discuss how the patient can incorporate goal setting into their daily lives to aide in recovery. Healthy Communication:   The focus of this group is to discuss communication, barriers to communication, as well as healthy ways to communicate with others. Making Healthy Choices:   The focus of this group is to help patients identify negative/unhealthy choices they were using prior to admission and identify positive/healthier coping strategies to replace them upon discharge. Self Care:   The focus of this group is to help patients understand the importance of self-care in order to improve or restore emotional, physical, spiritual, interpersonal, and financial health.    Participation Level:  Active  Participation Quality:  Appropriate  Affect:  Appropriate  Cognitive:  Alert  Insight: Appropriate  Engagement in Group:  Engaged  Modes of Intervention:  Discussion  Additional Comments:    [name] 10/21/2022, 5:50 AM

## 2022-10-21 NOTE — Progress Notes (Deleted)
   10/21/22 0700  Psych Admission Type (Psych Patients Only)  Admission Status Voluntary  Psychosocial Assessment  Patient Complaints None  Eye Contact Brief  Facial Expression Flat  Affect Flat  Speech Slurred  Interaction Minimal  Motor Activity Shuffling;Slow  Appearance/Hygiene Layered clothes  Behavior Characteristics Cooperative  Mood Pleasant;Preoccupied  Thought Process  Coherency Disorganized  Content Preoccupation  Delusions None reported or observed  Perception Hallucinations  Hallucination Auditory  Judgment Impaired  Confusion Mild  Danger to Self  Current suicidal ideation? Denies  Agreement Not to Harm Self Yes  Danger to Others  Danger to Others None reported or observed  Danger to Others Abnormal  Harmful Behavior to others No threats or harm toward other people  Destructive Behavior No threats or harm toward property   Patient appears worrisome but interactive with staff and peers. Tolerated all medications. Refused breakfast but ate lunch and dinner. Denies SI/HI/AVH.

## 2022-10-21 NOTE — Plan of Care (Signed)
Pt remains stable. Po med compliant. No behavior issues noted. Q 15 min checks provided. Denies SI/HI/AV/H. No c/o pain/discomfort noted.   Problem: Nutrition: Goal: Adequate nutrition will be maintained Outcome: Progressing   Problem: Coping: Goal: Level of anxiety will decrease Outcome: Progressing   Problem: Elimination: Goal: Will not experience complications related to bowel motility Outcome: Progressing Goal: Will not experience complications related to urinary retention Outcome: Progressing   Problem: Pain Managment: Goal: General experience of comfort will improve Outcome: Progressing   Problem: Safety: Goal: Ability to remain free from injury will improve Outcome: Progressing   Problem: Skin Integrity: Goal: Risk for impaired skin integrity will decrease Outcome: Progressing

## 2022-10-21 NOTE — Progress Notes (Signed)
[facility] MD Progress Note  10/21/2022  Betty Henderson  MRN:  [id number]  Subjective: Case discussed with RN and social worker, chart reviewed and Child psychotherapist. Patient seen today during rounds.  Per Child psychotherapist report APS referral has been made, they are waiting to hear back from the.  Per staff report patient is doing fine on the unit.  No acute events reported overnight.  Patient continues to respond to internal stimuli, although it is less frequent as compared to past few weeks.  Patient denies any thoughts of harming herself or others.  Social worker is working on placement.    Principal Problem: Schizoaffective disorder, bipolar type (HCC) Diagnosis: Principal Problem:   Schizoaffective disorder, bipolar type (HCC)  Past Psychiatric History: Schizoaffective disorder bipolar type  Past Medical History:  Past Medical History:  Diagnosis Date   Anemia    Arthritis    Chronic pain    Drug-seeking behavior    Malingering    Osteopetrosis    Psychosis (HCC)    Schizoaffective disorder, bipolar type (HCC)     Past Surgical History:  Procedure Laterality Date   MOUTH SURGERY     TUBAL LIGATION     Family History:  Family History  Family history unknown: Yes   Family Psychiatric  History: Unremarkable Social History:  Social History   Substance and Sexual Activity  Alcohol Use Not Currently   Comment: 1 cocktail 3 weeks ago     Social History   Substance and Sexual Activity  Drug Use Not Currently   Types: Cocaine   Comment: states "it's legal though"    Social History   Socioeconomic History   Marital status: Single    Spouse name: Not on file   Number of children: Not on file   Years of education: Not on file   Highest education level: Not on file  Occupational History   Not on file  Tobacco Use   Smoking status: Every Day    Current packs/day: 0.50    Types: Cigarettes   Smokeless tobacco: Never  Vaping Use   Vaping status: Never Used  Substance and  Sexual Activity   Alcohol use: Not Currently    Comment: 1 cocktail 3 weeks ago   Drug use: Not Currently    Types: Cocaine    Comment: states "it's legal though"   Sexual activity: Never  Other Topics Concern   Not on file  Social History Narrative   Not on file   Social Determinants of Health   Financial Resource Strain: Not on file  Food Insecurity: Food Insecurity Present (08/22/2022)   Hunger Vital Sign    Worried About Running Out of Food in the Last Year: Sometimes true    Ran Out of Food in the Last Year: Sometimes true  Transportation Needs: Patient Unable To Answer (08/22/2022)   PRAPARE - Transportation    Lack of Transportation (Medical): Patient unable to answer    Lack of Transportation (Non-Medical): Patient unable to answer  Recent Concern: Transportation Needs - Unmet Transportation Needs (06/13/2022)   Received from [facility], [facility]   [facility] - Transportation    Lack of Transportation (Medical): Not on file    Lack of Transportation (Non-Medical): Yes  Physical Activity: Not on file  Stress: Not on file  Social Connections: Unknown (08/06/2021)   Received from [facility], [facility]   Social Network    Social Network: Not on file   Additional Social History:  Sleep: Good  Appetite:  Good  Current Medications: Current Facility-Administered Medications  Medication Dose Route Frequency Provider Last Rate Last Admin   acetaminophen (TYLENOL) tablet 650 mg  650 mg Oral Q6H PRN [name], DO   650 mg at 10/21/22 1003   alum & mag hydroxide-simeth (MAALOX/MYLANTA) 200-200-20 MG/5ML suspension 30 mL  30 mL Oral Q4H PRN [name], DO   30 mL at 09/20/22 1142   diphenhydrAMINE (BENADRYL) capsule 50 mg  50 mg Oral Q6H PRN [name], DO   50 mg at 09/29/22 2254   Or   diphenhydrAMINE (BENADRYL) injection 50 mg  50 mg Intramuscular Q6H PRN [name], DO        haloperidol (HALDOL) tablet 5 mg  5 mg Oral Q6H PRN [name], DO   5 mg at 09/28/22 2149   Or   haloperidol lactate (HALDOL) injection 5 mg  5 mg Intramuscular Q6H PRN [name], DO       ibuprofen (ADVIL) tablet 600 mg  600 mg Oral Q6H PRN [name], [name], MD   600 mg at 10/17/22 2125   LORazepam (ATIVAN) tablet 1 mg  1 mg Oral TID PRN [name], DO   1 mg at 09/27/22 0025   magnesium hydroxide (MILK OF MAGNESIA) suspension 30 mL  30 mL Oral Daily PRN [name], DO       [START ON 11/13/2022] paliperidone (INVEGA SUSTENNA) injection 156 mg  156 mg Intramuscular Once [name], MD       paliperidone (INVEGA) 24 hr tablet 3 mg  3 mg Oral QHS [name], DO   3 mg at 10/20/22 2122   traZODone (DESYREL) tablet 50 mg  50 mg Oral QHS PRN [name], DO   50 mg at 10/20/22 2122    Lab Results: No results found for this or any previous visit (from the past 48 hour(s)).  Blood Alcohol level:  Lab Results  Component Value Date   ETH <10 08/17/2022   ETH <10 01/11/2021    Metabolic Disorder Labs: No results found for: "HGBA1C", "MPG" No results found for: "PROLACTIN" No results found for: "CHOL", "TRIG", "HDL", "CHOLHDL", "VLDL", "LDLCALC"  Physical Findings: AIMS:  , ,  ,  ,    CIWA:    COWS:     Musculoskeletal: Strength & Muscle Tone: within normal limits Gait & Station: normal Patient leans: N/A  Psychiatric Specialty Exam:  Presentation  General Appearance:  Casual; Neat  Eye Contact: Fair  Speech:  Garbled  Speech Volume: Normal  Handedness: Right   Mood and Affect  Mood: Fine  Affect: More animated   Thought Process  Thought Processes: Disorganized Probably at baseline. Improved since admission  Descriptions of Associations:Loose  Orientation:Partial  Thought Content:Denies SI/HI, delusional  Hallucinations:Denies , but is observed talking to self Ideas of  Reference:None  Suicidal Thoughts:Denies SI Homicidal Thoughts:Denies HI  Sensorium  Memory: Immediate Fair; Remote Poor  Judgment: Fair, pt is Rx compliant  Insight: Limited   Executive Functions  Concentration: Decreased  Attention Span: Short  Language: Fair   Psychomotor Activity  Psychomotor Activity:Variable, at times decreased, and at times increased  Assets  Assets: Communication Skills   Sleep  Sleep:Improved   Physical Exam: Physical Exam Vitals and nursing note reviewed.  Constitutional:      Appearance: Normal appearance. She is normal weight.  Neurological:     General: No focal deficit present.  Mental Status: She is alert.    Review of Systems  Constitutional: Negative.   HENT: Negative.    Eyes: Negative.   Respiratory: Negative.    Cardiovascular: Negative.   Gastrointestinal: Negative.   Genitourinary: Negative.   Musculoskeletal: Negative.   Skin: Negative.   Neurological: Negative.   Endo/Heme/Allergies: Negative.   Psychiatric/Behavioral: Negative.     Blood pressure 105/85, pulse 75, temperature 98.4 F (36.9 C), temperature source Oral, resp. rate 16, height 5\' 6"  (1.676 m), weight 64 kg, SpO2 100%. Body mass index is 22.76 kg/m.   Treatment Plan Summary: Daily contact with patient to assess and evaluate symptoms and progress in treatment and Medication management  Patient received Invega Sustenna 234 mg IM on 09/27/22 Invega Sustenna 156 mg IM given, 10/15/22 to be repeated every 4 weeks. Continue on Invega 3 mg Po daily   [name], MD

## 2022-10-21 NOTE — BHH Counselor (Signed)
CSW contacted [facility] APS to inquire about case.   CSW was informed Leanora Cover would be out for a few days and to call her supervisor.   CSW contacted [employer] supervisors, Mel Stimpson [phone]) and LVM requesting return call  CSW received return call from [place] who stated she filed the petition this morning for the state to become guardian of pt. She stated she must wait for the clerk of court to approve the petition.   Reynaldo Minium, MSW, [place] 10/21/2022 10:40 AM

## 2022-10-21 NOTE — Group Note (Signed)
Recreation Therapy Group Note   Group Topic:Stress Management  Group Date: 10/21/2022 Start Time: 1000 End Time: 1100 Facilitators: Rosina Lowenstein, LRT, CTRS Location:  Dayroom and Courtyard  Group description: Minute To Win It. LRT and pts played different versions of the Cup [employer] It game. Each pt is given 6 foam cups. They are instructed to make 1 pyramid while using all 6 cups using both hands, right hand only, left hand only, with their eyes closed, to pair up with someone in the group and make a pyramid with 12 cups, and lastly, to make the tallest pyramid with 12 cups. Game show themed music is being played in the background. LRT and pts discussed the physical and mental symptoms of being under stress or under pressure. LRT and pts discussed how this can be used post discharge. Afterwards, pts requested to go outside to the courtyard for fresh air and sunlight.    Goal Area(s) Addressed: Patient will identify physical symptoms of stress. Patient will identify emotional symptoms of stress. Patient will build frustration tolerance skills.    Affect/Mood: N/A   Participation Level: Did not attend    Clinical Observations/Individualized Feedback: Betty Henderson did not attend group, however came outside to the courtyard briefly.   Plan: Continue to engage patient in RT group sessions 2-3x/week.   Rosina Lowenstein, LRT, CTRS 10/21/2022 3:10 PM

## 2022-10-21 NOTE — Group Note (Signed)
Date:  10/21/2022 Time:  10:47 PM  Group Topic/Focus:  Building Self Esteem:   The Focus of this group is helping patients become aware of the effects of self-esteem on their lives, the things they and others do that enhance or undermine their self-esteem, seeing the relationship between their level of self-esteem and the choices they make and learning ways to enhance self-esteem.    Participation Level:  Active  Participation Quality:  Appropriate  Affect:  Appropriate  Cognitive:  Alert  Insight: Appropriate  Engagement in Group:  Engaged  Modes of Intervention:  Discussion  Additional Comments:    [name] 10/21/2022, 10:47 PM

## 2022-10-21 NOTE — Progress Notes (Signed)
   10/21/22 0700  Psych Admission Type (Psych Patients Only)  Admission Status Voluntary  Psychosocial Assessment  Patient Complaints None  Eye Contact Brief  Facial Expression Flat  Affect Flat  Speech Slurred  Interaction Minimal  Motor Activity Shuffling;Slow  Appearance/Hygiene Layered clothes  Behavior Characteristics Cooperative  Mood Pleasant;Preoccupied  Thought Process  Coherency Disorganized  Content Preoccupation  Delusions None reported or observed  Perception Hallucinations  Hallucination Auditory  Judgment Impaired  Confusion Mild  Danger to Self  Current suicidal ideation? Denies  Agreement Not to Harm Self Yes  Danger to Others  Danger to Others None reported or observed  Danger to Others Abnormal  Harmful Behavior to others No threats or harm toward other people  Destructive Behavior No threats or harm toward property   Patient with minimal interaction with staff. Denies SI/HI/AVH. Tolerated all medications and meals.

## 2022-10-22 DIAGNOSIS — F250 Schizoaffective disorder, bipolar type: Secondary | ICD-10-CM | POA: Diagnosis not present

## 2022-10-22 NOTE — Plan of Care (Signed)
  Problem: Nutrition: Goal: Adequate nutrition will be maintained Outcome: Progressing   Problem: Coping: Goal: Level of anxiety will decrease Outcome: Progressing   

## 2022-10-22 NOTE — Group Note (Signed)
Recreation Therapy Group Note   Group Topic:Goal Setting  Group Date: 10/22/2022 Start Time: 1400 End Time: 1500 Facilitators: Rosina Lowenstein, LRT, CTRS Location:  Day Room  Group Description: Vision Board. Patients were given many different magazines, a glue stick, markers, and a piece of cardstock paper. LRT and pts discussed the importance of having goals in life. LRT and pts discussed the difference between short-term and long-term goals, as well as what a SMART goal is. LRT encouraged pts to create a vision board, with images they picked and then cut out with safety scissors from the magazine, for themselves, that capture their short and long-term goals. LRT encouraged pts to show and explain their vision board to the group. LRT offered to laminate vision board once dry and complete.   Goal Area(s) Addressed:  Patient will gain knowledge of short vs. long term goals.  Patient will identify goals for themselves. Patient will practice setting SMART goals. Patient will verbalize their goals to LRT and peers.   Affect/Mood: N/A   Participation Level: Minimal    Clinical Observations/Individualized Feedback: Hawanya came late to group and left early. She was not present for educational piece or post activity processing discussion.   Plan: Continue to engage patient in RT group sessions 2-3x/week.   [address], LRT, CTRS 10/22/2022 3:13 PM

## 2022-10-22 NOTE — Plan of Care (Signed)
  Problem: Education: Goal: Knowledge of General Education information will improve Description: Including pain rating scale, medication(s)/side effects and non-pharmacologic comfort measures Outcome: Progressing   Problem: Health Behavior/Discharge Planning: Goal: Ability to manage health-related needs will improve Outcome: Progressing   Problem: Clinical Measurements: Goal: Ability to maintain clinical measurements within normal limits will improve Outcome: Progressing Goal: Diagnostic test results will improve Outcome: Progressing   Problem: Nutrition: Goal: Adequate nutrition will be maintained Outcome: Progressing   Problem: Coping: Goal: Level of anxiety will decrease Outcome: Progressing   Problem: Elimination: Goal: Will not experience complications related to bowel motility Outcome: Progressing Goal: Will not experience complications related to urinary retention Outcome: Progressing   Problem: Pain Managment: Goal: General experience of comfort will improve Outcome: Progressing   Problem: Safety: Goal: Ability to remain free from injury will improve Outcome: Progressing   Problem: Skin Integrity: Goal: Risk for impaired skin integrity will decrease Outcome: Progressing

## 2022-10-22 NOTE — Progress Notes (Signed)
   10/22/22 0700  Psych Admission Type (Psych Patients Only)  Admission Status Voluntary  Psychosocial Assessment  Patient Complaints None  Eye Contact Brief  Facial Expression Flat  Affect Flat  Speech Slurred  Interaction Minimal  Motor Activity Shuffling;Slow  Appearance/Hygiene Layered clothes  Thought Process  Coherency Disorganized  Content Preoccupation  Delusions None reported or observed  Perception Hallucinations  Hallucination Auditory  Judgment Impaired  Confusion Mild  Danger to Self  Current suicidal ideation? Denies  Agreement Not to Harm Self Yes  Danger to Others  Danger to Others None reported or observed  Danger to Others Abnormal  Harmful Behavior to others No threats or harm toward other people  Destructive Behavior No threats or harm toward property   Denies SI/HI/AVH. Tolerated all meals. Attended group.

## 2022-10-22 NOTE — Progress Notes (Signed)
   10/22/22 1953  Psych Admission Type (Psych Patients Only)  Admission Status Voluntary  Psychosocial Assessment  Patient Complaints None  Eye Contact Brief  Facial Expression Flat  Affect Flat  Speech Slurred  Interaction Minimal;No initiation  Motor Activity Shuffling;Slow  Appearance/Hygiene Layered clothes  Behavior Characteristics Cooperative  Mood Pleasant  Thought Process  Coherency Disorganized  Content Preoccupation  Delusions None reported or observed  Perception Hallucinations  Hallucination Auditory  Judgment Limited  Confusion Mild  Danger to Self  Current suicidal ideation? Denies

## 2022-10-22 NOTE — Group Note (Signed)
LCSW Group Therapy Note   Group Date: 10/22/2022 Start Time: 1300 End Time: 1345   Type of Therapy and Topic:  Group Therapy: Challenging Core Beliefs  Participation Level:  Did Not Attend  Description of Group:  Patients were educated about core beliefs and asked to identify one harmful core belief that they have. Patients were asked to explore from where those beliefs originate. Patients were asked to discuss how those beliefs make them feel and the resulting behaviors of those beliefs. They were then be asked if those beliefs are true and, if so, what evidence they have to support them. Lastly, group members were challenged to replace those negative core beliefs with helpful beliefs.   Therapeutic Goals:   1. Patient will identify harmful core beliefs and explore the origins of such beliefs. 2. Patient will identify feelings and behaviors that result from those core beliefs. 3. Patient will discuss whether such beliefs are true. 4.  Patient will replace harmful core beliefs with helpful ones.  Summary of Patient Progress: X  Therapeutic Modalities: Cognitive Behavioral Therapy; Solution-Focused Therapy   Elza Rafter, [place] 10/22/2022  1:58 PM

## 2022-10-22 NOTE — Progress Notes (Signed)
[facility] MD Progress Note  10/22/2022 1:55 PM Betty Henderson  MRN:  [id number] Subjective: Betty Henderson is seen on rounds.  She is in good controls.  No side effects from her medication.  Nurses report no issues.  She has no complaints.  She received Invega Sustenna 156 on 10/16/2022. Principal Problem: Schizoaffective disorder, bipolar type (HCC) Diagnosis: Principal Problem:   Schizoaffective disorder, bipolar type (HCC)  Total Time spent with patient: 15 minutes  Past Psychiatric History: Schizoaffective disorder  Past Medical History:  Past Medical History:  Diagnosis Date   Anemia    Arthritis    Chronic pain    Drug-seeking behavior    Malingering    Osteopetrosis    Psychosis (HCC)    Schizoaffective disorder, bipolar type (HCC)     Past Surgical History:  Procedure Laterality Date   MOUTH SURGERY     TUBAL LIGATION     Family History:  Family History  Family history unknown: Yes   Family Psychiatric  History: Unremarkable Social History:  Social History   Substance and Sexual Activity  Alcohol Use Not Currently   Comment: 1 cocktail 3 weeks ago     Social History   Substance and Sexual Activity  Drug Use Not Currently   Types: Cocaine   Comment: states "it's legal though"    Social History   Socioeconomic History   Marital status: Single    Spouse name: Not on file   Number of children: Not on file   Years of education: Not on file   Highest education level: Not on file  Occupational History   Not on file  Tobacco Use   Smoking status: Every Day    Current packs/day: 0.50    Types: Cigarettes   Smokeless tobacco: Never  Vaping Use   Vaping status: Never Used  Substance and Sexual Activity   Alcohol use: Not Currently    Comment: 1 cocktail 3 weeks ago   Drug use: Not Currently    Types: Cocaine    Comment: states "it's legal though"   Sexual activity: Never  Other Topics Concern   Not on file  Social History Narrative   Not on file   Social  Determinants of Health   Financial Resource Strain: Not on file  Food Insecurity: Food Insecurity Present (08/22/2022)   Hunger Vital Sign    Worried About Running Out of Food in the Last Year: Sometimes true    Ran Out of Food in the Last Year: Sometimes true  Transportation Needs: Patient Unable To Answer (08/22/2022)   PRAPARE - Transportation    Lack of Transportation (Medical): Patient unable to answer    Lack of Transportation (Non-Medical): Patient unable to answer  Recent Concern: Transportation Needs - Unmet Transportation Needs (06/13/2022)   Received from [facility], [facility]   [facility] - Transportation    Lack of Transportation (Medical): Not on file    Lack of Transportation (Non-Medical): Yes  Physical Activity: Not on file  Stress: Not on file  Social Connections: Unknown (08/06/2021)   Received from [facility], [facility]   Social Network    Social Network: Not on file   Additional Social History:                         Sleep: Good  Appetite:  Good  Current Medications: Current Facility-Administered Medications  Medication Dose Route Frequency Provider Last Rate Last Admin   acetaminophen (TYLENOL) tablet 650  mg  650 mg Oral Q6H PRN [name], DO   650 mg at 10/21/22 2340   alum & mag hydroxide-simeth (MAALOX/MYLANTA) 200-200-20 MG/5ML suspension 30 mL  30 mL Oral Q4H PRN [name], DO   30 mL at 09/20/22 1142   diphenhydrAMINE (BENADRYL) capsule 50 mg  50 mg Oral Q6H PRN [name], DO   50 mg at 09/29/22 2254   Or   diphenhydrAMINE (BENADRYL) injection 50 mg  50 mg Intramuscular Q6H PRN [name], DO       haloperidol (HALDOL) tablet 5 mg  5 mg Oral Q6H PRN [name], DO   5 mg at 09/28/22 2149   Or   haloperidol lactate (HALDOL) injection 5 mg  5 mg Intramuscular Q6H PRN [name], DO       ibuprofen (ADVIL) tablet 600 mg  600 mg Oral Q6H PRN [name], John  T, MD   600 mg at 10/17/22 2125   LORazepam (ATIVAN) tablet 1 mg  1 mg Oral TID PRN [name], DO   1 mg at 09/27/22 0025   magnesium hydroxide (MILK OF MAGNESIA) suspension 30 mL  30 mL Oral Daily PRN [name], DO       [START ON 11/13/2022] paliperidone (INVEGA SUSTENNA) injection 156 mg  156 mg Intramuscular Once [name], MD       paliperidone (INVEGA) 24 hr tablet 3 mg  3 mg Oral QHS [name], DO   3 mg at 10/21/22 2115   traZODone (DESYREL) tablet 50 mg  50 mg Oral QHS PRN [name], DO   50 mg at 10/21/22 2115    Lab Results: No results found for this or any previous visit (from the past 48 hour(s)).  Blood Alcohol level:  Lab Results  Component Value Date   ETH <10 08/17/2022   ETH <10 01/11/2021    Metabolic Disorder Labs: No results found for: "HGBA1C", "MPG" No results found for: "PROLACTIN" No results found for: "CHOL", "TRIG", "HDL", "CHOLHDL", "VLDL", "LDLCALC"  Physical Findings: AIMS:  , ,  ,  ,    CIWA:    COWS:     Musculoskeletal: Strength & Muscle Tone: within normal limits Gait & Station: normal Patient leans: N/A  Psychiatric Specialty Exam:  Presentation  General Appearance:  Casual; Neat  Eye Contact: Fair  Speech: Slow; Slurred  Speech Volume: Decreased  Handedness: Right   Mood and Affect  Mood: Anxious  Affect: Inappropriate   Thought Process  Thought Processes: Disorganized  Descriptions of Associations:Loose  Orientation:Partial  Thought Content:Illogical; Rumination  History of Schizophrenia/Schizoaffective disorder:No data recorded Duration of Psychotic Symptoms:No data recorded Hallucinations:No data recorded Ideas of Reference:Paranoia  Suicidal Thoughts:No data recorded Homicidal Thoughts:No data recorded  Sensorium  Memory: Immediate Fair; Remote Poor  Judgment: Poor  Insight: Poor   Executive Functions   Concentration: Poor  Attention Span: Fair  Recall: [employer] of Knowledge: Fair  Language: Fair   Psychomotor Activity  Psychomotor Activity:No data recorded  Assets  Assets: Communication Skills   Sleep  Sleep:No data recorded   Physical Exam: Physical Exam Vitals and nursing note reviewed.  Constitutional:      Appearance: Normal appearance. She is normal weight.  Neurological:     General: No focal deficit present.     Mental Status: She is alert and oriented to person, place, and time.  Psychiatric:        Attention and  Perception: Attention and perception normal.        Mood and Affect: Mood and affect normal.        Speech: Speech normal.        Behavior: Behavior normal. Behavior is cooperative.        Thought Content: Thought content normal.        Cognition and Memory: Cognition and memory normal.        Judgment: Judgment normal.    Review of Systems  Constitutional: Negative.   HENT: Negative.    Eyes: Negative.   Respiratory: Negative.    Cardiovascular: Negative.   Gastrointestinal: Negative.   Genitourinary: Negative.   Musculoskeletal: Negative.   Skin: Negative.   Neurological: Negative.   Endo/Heme/Allergies: Negative.   Psychiatric/Behavioral: Negative.     Blood pressure 117/73, pulse 65, temperature 98.2 F (36.8 C), resp. rate 18, height 5\' 6"  (1.676 m), weight 64 kg, SpO2 100%. Body mass index is 22.76 kg/m.   Treatment Plan Summary: Daily contact with patient to assess and evaluate symptoms and progress in treatment, Medication management, and Plan continue current medication.  [name], DO 10/22/2022, 1:55 PM

## 2022-10-23 DIAGNOSIS — F250 Schizoaffective disorder, bipolar type: Secondary | ICD-10-CM | POA: Diagnosis not present

## 2022-10-23 NOTE — Plan of Care (Signed)
  Problem: Nutrition: Goal: Adequate nutrition will be maintained Outcome: Progressing   Problem: Coping: Goal: Level of anxiety will decrease Outcome: Progressing   

## 2022-10-23 NOTE — Group Note (Addendum)
Date:  10/23/2022 Time:  2:51 PM  Group Topic/Focus:  Coping With Mental Health Crisis:   The purpose of this group is to help patients identify strategies for coping with mental health crisis.  Group discusses possible causes of crisis and ways to manage them effectively. Healthy Communication:   The focus of this group is to discuss communication, barriers to communication, as well as healthy ways to communicate with others.  We went outside to get some fresh air!   Participation Level:  Active  Participation Quality:  Appropriate  Affect:  Appropriate  Cognitive:  Appropriate  Insight: Appropriate  Engagement in Group:  Engaged  Modes of Intervention:  Discussion  Additional Comments:     Betty Henderson L Davinci Glotfelty 10/23/2022, 2:51 PM

## 2022-10-23 NOTE — Progress Notes (Signed)
[facility] MD Progress Note  10/23/2022 12:22 PM Betty Henderson  MRN:  [id number] Subjective: Betty Henderson is seen on rounds.  She has been pleasant and cooperative on the unit.  She has been in good controls.  Nurses report no issues.  She denies any side effects from her medication.  She took a long-acting injection last week.  We are still awaiting to hear from DSS on placement. Principal Problem: Schizoaffective disorder, bipolar type (HCC) Diagnosis: Principal Problem:   Schizoaffective disorder, bipolar type (HCC)  Total Time spent with patient: 15 minutes  Past Psychiatric History: schizoaffective disorder, bipolar type  Past Medical History:  Past Medical History:  Diagnosis Date   Anemia    Arthritis    Chronic pain    Drug-seeking behavior    Malingering    Osteopetrosis    Psychosis (HCC)    Schizoaffective disorder, bipolar type (HCC)     Past Surgical History:  Procedure Laterality Date   MOUTH SURGERY     TUBAL LIGATION     Family History:  Family History  Family history unknown: Yes   Family Psychiatric  History: Unremarkable Social History:  Social History   Substance and Sexual Activity  Alcohol Use Not Currently   Comment: 1 cocktail 3 weeks ago     Social History   Substance and Sexual Activity  Drug Use Not Currently   Types: Cocaine   Comment: states "it's legal though"    Social History   Socioeconomic History   Marital status: Single    Spouse name: Not on file   Number of children: Not on file   Years of education: Not on file   Highest education level: Not on file  Occupational History   Not on file  Tobacco Use   Smoking status: Every Day    Current packs/day: 0.50    Types: Cigarettes   Smokeless tobacco: Never  Vaping Use   Vaping status: Never Used  Substance and Sexual Activity   Alcohol use: Not Currently    Comment: 1 cocktail 3 weeks ago   Drug use: Not Currently    Types: Cocaine    Comment: states "it's legal though"    Sexual activity: Never  Other Topics Concern   Not on file  Social History Narrative   Not on file   Social Determinants of Health   Financial Resource Strain: Not on file  Food Insecurity: Food Insecurity Present (08/22/2022)   Hunger Vital Sign    Worried About Running Out of Food in the Last Year: Sometimes true    Ran Out of Food in the Last Year: Sometimes true  Transportation Needs: Patient Unable To Answer (08/22/2022)   PRAPARE - Transportation    Lack of Transportation (Medical): Patient unable to answer    Lack of Transportation (Non-Medical): Patient unable to answer  Recent Concern: Transportation Needs - Unmet Transportation Needs (06/13/2022)   Received from [facility], [facility]   [facility] - Transportation    Lack of Transportation (Medical): Not on file    Lack of Transportation (Non-Medical): Yes  Physical Activity: Not on file  Stress: Not on file  Social Connections: Unknown (08/06/2021)   Received from [facility], [facility]   Social Network    Social Network: Not on file   Additional Social History:                         Sleep: Good  Appetite:  Good  Current Medications: Current Facility-Administered Medications  Medication Dose Route Frequency Provider Last Rate Last Admin   acetaminophen (TYLENOL) tablet 650 mg  650 mg Oral Q6H PRN [name], DO   650 mg at 10/21/22 2340   alum & mag hydroxide-simeth (MAALOX/MYLANTA) 200-200-20 MG/5ML suspension 30 mL  30 mL Oral Q4H PRN [name], DO   30 mL at 09/20/22 1142   diphenhydrAMINE (BENADRYL) capsule 50 mg  50 mg Oral Q6H PRN [name], DO   50 mg at 09/29/22 2254   Or   diphenhydrAMINE (BENADRYL) injection 50 mg  50 mg Intramuscular Q6H PRN [name], DO       haloperidol (HALDOL) tablet 5 mg  5 mg Oral Q6H PRN [name], DO   5 mg at 09/28/22 2149   Or   haloperidol lactate (HALDOL) injection 5 mg  5 mg  Intramuscular Q6H PRN [name], DO       ibuprofen (ADVIL) tablet 600 mg  600 mg Oral Q6H PRN [name], [name], MD   600 mg at 10/17/22 2125   LORazepam (ATIVAN) tablet 1 mg  1 mg Oral TID PRN [name], DO   1 mg at 09/27/22 0025   magnesium hydroxide (MILK OF MAGNESIA) suspension 30 mL  30 mL Oral Daily PRN [name], DO       [START ON 11/13/2022] paliperidone (INVEGA SUSTENNA) injection 156 mg  156 mg Intramuscular Once [name], MD       paliperidone (INVEGA) 24 hr tablet 3 mg  3 mg Oral QHS [name], DO   3 mg at 10/22/22 2059   traZODone (DESYREL) tablet 50 mg  50 mg Oral QHS PRN [name], DO   50 mg at 10/22/22 2059    Lab Results: No results found for this or any previous visit (from the past 48 hour(s)).  Blood Alcohol level:  Lab Results  Component Value Date   ETH <10 08/17/2022   ETH <10 01/11/2021    Metabolic Disorder Labs: No results found for: "HGBA1C", "MPG" No results found for: "PROLACTIN" No results found for: "CHOL", "TRIG", "HDL", "CHOLHDL", "VLDL", "LDLCALC"  Physical Findings: AIMS:  , ,  ,  ,    CIWA:    COWS:     Musculoskeletal: Strength & Muscle Tone: within normal limits Gait & Station: normal Patient leans: N/A  Psychiatric Specialty Exam:  Presentation  General Appearance:  Casual; Neat  Eye Contact: Fair  Speech: Slow; Slurred  Speech Volume: Decreased  Handedness: Right   Mood and Affect  Mood: Anxious  Affect: Inappropriate   Thought Process  Thought Processes: Disorganized  Descriptions of Associations:Loose  Orientation:Partial  Thought Content:Illogical; Rumination  History of Schizophrenia/Schizoaffective disorder:No data recorded Duration of Psychotic Symptoms:No data recorded Hallucinations:No data recorded Ideas of Reference:Paranoia  Suicidal Thoughts:No data recorded Homicidal Thoughts:No data recorded  Sensorium   Memory: Immediate Fair; Remote Poor  Judgment: Poor  Insight: Poor   Executive Functions  Concentration: Poor  Attention Span: Fair  Recall: [employer] of Knowledge: Fair  Language: Fair   Psychomotor Activity  Psychomotor Activity:No data recorded  Assets  Assets: Communication Skills   Sleep  Sleep:No data recorded    Blood pressure (!) 140/86, pulse 86, temperature 99.1 F (37.3 C), temperature source Oral, resp. rate 17, height 5\' 6"  (1.676 m), weight 64 kg, SpO2 99%. Body mass index is 22.76 kg/m.   Treatment Plan Summary: Daily  contact with patient to assess and evaluate symptoms and progress in treatment, Medication management, and Plan continue current medications.  [name], DO 10/23/2022, 12:22 PM

## 2022-10-23 NOTE — Group Note (Signed)
Recreation Therapy Group Note   Group Topic:Communication  Group Date: 10/23/2022 Start Time: 0900 End Time: 1000 Facilitators: Rosina Lowenstein, LRT, CTRS Location: Courtyard   Group Description: Music Reminisce. LRT encouraged patients to think of their favorite song(s) that reminded them of a positive memory or time in their life. LRT encouraged patient to talk about that memory aloud to the group. LRT played the song through a speaker for all to hear. LRT and patients discussed how thinking of a positive memory or time in their life can be used as a coping skill in everyday life post discharge.    Goal Area(s) Addressed: Patient will increase verbal communication by conversing with peers. Patient will contribute to group discussion with minimal prompting. Patient will reminisce a positive memory or moment in their life.    Affect/Mood: Appropriate   Participation Level: Active and Engaged   Participation Quality: Independent   Behavior: Appropriate, Calm, and Cooperative   Speech/Thought Process: Coherent   Insight: Good   Judgement: Good   Modes of Intervention: Activity   Patient Response to Interventions:  Attentive, Engaged, Interested , and Receptive   Education Outcome:  Acknowledges education   Clinical Observations/Individualized Feedback: Betty Henderson was active in their participation of session activities and group discussion. Pt identified "I like to sing and dance to songs. Oh happy day is my favorite". Pt was singing songs and smiling while outside. Pt interacted well with LRT and peers duration of session.   Plan: Continue to engage patient in RT group sessions 2-3x/week.   Rosina Lowenstein, LRT, CTRS 10/23/2022 10:57 AM

## 2022-10-23 NOTE — BH IP Treatment Plan (Signed)
Interdisciplinary Treatment and Diagnostic Plan Update  10/23/2022 Time of Session: 9:00 AM  Betty Henderson MRN: [id number]  Principal Diagnosis: Schizoaffective disorder, bipolar type (HCC)  Secondary Diagnoses: Principal Problem:   Schizoaffective disorder, bipolar type (HCC)   Current Medications:  Current Facility-Administered Medications  Medication Dose Route Frequency Provider Last Rate Last Admin   acetaminophen (TYLENOL) tablet 650 mg  650 mg Oral Q6H PRN [name], DO   650 mg at 10/21/22 2340   alum & mag hydroxide-simeth (MAALOX/MYLANTA) 200-200-20 MG/5ML suspension 30 mL  30 mL Oral Q4H PRN [name], DO   30 mL at 09/20/22 1142   diphenhydrAMINE (BENADRYL) capsule 50 mg  50 mg Oral Q6H PRN [name], DO   50 mg at 09/29/22 2254   Or   diphenhydrAMINE (BENADRYL) injection 50 mg  50 mg Intramuscular Q6H PRN [name], DO       haloperidol (HALDOL) tablet 5 mg  5 mg Oral Q6H PRN [name], DO   5 mg at 09/28/22 2149   Or   haloperidol lactate (HALDOL) injection 5 mg  5 mg Intramuscular Q6H PRN [name], DO       ibuprofen (ADVIL) tablet 600 mg  600 mg Oral Q6H PRN [name], [name], MD   600 mg at 10/17/22 2125   LORazepam (ATIVAN) tablet 1 mg  1 mg Oral TID PRN [name], DO   1 mg at 09/27/22 0025   magnesium hydroxide (MILK OF MAGNESIA) suspension 30 mL  30 mL Oral Daily PRN [name], DO       [START ON 11/13/2022] paliperidone (INVEGA SUSTENNA) injection 156 mg  156 mg Intramuscular Once [name], MD       paliperidone (INVEGA) 24 hr tablet 3 mg  3 mg Oral QHS [name], DO   3 mg at 10/22/22 2059   traZODone (DESYREL) tablet 50 mg  50 mg Oral QHS PRN [name], DO   50 mg at 10/22/22 2059   PTA Medications: Medications Prior to Admission  Medication Sig Dispense Refill Last Dose   ondansetron (ZOFRAN) 4 MG tablet Take 1  tablet (4 mg total) by mouth every 8 (eight) hours as needed for nausea or vomiting. (Patient not taking: Reported on 08/17/2022) 20 tablet 0     Patient Stressors: Financial difficulties   Health problems   Medication change or noncompliance    Patient Strengths: Ability for insight   Treatment Modalities: Medication Management, Group therapy, Case management,  1 to 1 session with clinician, Psychoeducation, Recreational therapy.   Physician Treatment Plan for Primary Diagnosis: Schizoaffective disorder, bipolar type (HCC) Long Term Goal(s): Improvement in symptoms so as ready for discharge   Short Term Goals: Ability to identify changes in lifestyle to reduce recurrence of condition will improve Ability to verbalize feelings will improve Ability to disclose and discuss suicidal ideas Ability to demonstrate self-control will improve Ability to identify and develop effective coping behaviors will improve Ability to maintain clinical measurements within normal limits will improve Compliance with prescribed medications will improve Ability to identify triggers associated with substance abuse/mental health issues will improve  Medication Management: Evaluate patient's response, side effects, and tolerance of medication regimen.  Therapeutic Interventions: 1 to 1 sessions, Unit Group sessions and Medication administration.  Evaluation of Outcomes: Progressing  Physician Treatment Plan for Secondary Diagnosis: Principal Problem:   Schizoaffective disorder, bipolar type (HCC)  Long Term Goal(s): Improvement in symptoms  so as ready for discharge   Short Term Goals: Ability to identify changes in lifestyle to reduce recurrence of condition will improve Ability to verbalize feelings will improve Ability to disclose and discuss suicidal ideas Ability to demonstrate self-control will improve Ability to identify and develop effective coping behaviors will improve Ability to maintain  clinical measurements within normal limits will improve Compliance with prescribed medications will improve Ability to identify triggers associated with substance abuse/mental health issues will improve     Medication Management: Evaluate patient's response, side effects, and tolerance of medication regimen.  Therapeutic Interventions: 1 to 1 sessions, Unit Group sessions and Medication administration.  Evaluation of Outcomes: Progressing   RN Treatment Plan for Primary Diagnosis: Schizoaffective disorder, bipolar type (HCC) Long Term Goal(s): Knowledge of disease and therapeutic regimen to maintain health will improve  Short Term Goals: Ability to remain free from injury will improve, Ability to verbalize frustration and anger appropriately will improve, Ability to demonstrate self-control, Ability to participate in decision making will improve, Ability to verbalize feelings will improve, Ability to disclose and discuss suicidal ideas, Ability to identify and develop effective coping behaviors will improve, and Compliance with prescribed medications will improve  Medication Management: RN will administer medications as ordered by provider, will assess and evaluate patient's response and provide education to patient for prescribed medication. RN will report any adverse and/or side effects to prescribing provider.  Therapeutic Interventions: 1 on 1 counseling sessions, Psychoeducation, Medication administration, Evaluate responses to treatment, Monitor vital signs and CBGs as ordered, Perform/monitor CIWA, COWS, AIMS and Fall Risk screenings as ordered, Perform wound care treatments as ordered.  Evaluation of Outcomes: Progressing   LCSW Treatment Plan for Primary Diagnosis: Schizoaffective disorder, bipolar type (HCC) Long Term Goal(s): Safe transition to appropriate next level of care at discharge, Engage patient in therapeutic group addressing interpersonal concerns.  Short Term Goals:  Engage patient in aftercare planning with referrals and resources, Increase social support, Increase ability to appropriately verbalize feelings, Increase emotional regulation, Facilitate acceptance of mental health diagnosis and concerns, and Increase skills for wellness and recovery  Therapeutic Interventions: Assess for all discharge needs, 1 to 1 time with Social worker, Explore available resources and support systems, Assess for adequacy in community support network, Educate family and significant other(s) on suicide prevention, Complete Psychosocial Assessment, Interpersonal group therapy.  Evaluation of Outcomes: Progressing   Progress in Treatment: Attending groups: Yes. Participating in groups: Yes. Taking medication as prescribed: Yes. Toleration medication: Yes. Family/Significant other contact made: Yes, individual(s) contacted:  Caylea Valladolid, son, [phone] Patient understands diagnosis: Yes. Discussing patient identified problems/goals with staff: Yes. Medical problems stabilized or resolved: Yes. Denies suicidal/homicidal ideation: Yes. Issues/concerns per patient self-inventory: Yes. Other: No  New Short Term/Long Term Goal(s): Update 6/30: none at this time. Update 09/27/2022:  No changes at this time.  Update 10/02/2022:  No changes at this time. Update 10/07/22: No changes at this time 10/12/22: No changes at this time Update 10/18/22: No changes at this time Update 10/23/22: No changes at this time      Patient Goals:  Update 6/30: none at this time. Update 09/27/2022:  No changes at this time. Update 10/02/2022:  No changes at this time. Update 10/07/22: No changes at this time 10/12/22: No changes at this time Update 10/18/22: No changes at this time Update 10/23/22: No changes at this time     Discharge Plan or Barriers: Update 6/30: APS report has been made and patient is being investigated for  guardianship needs.  Remains homeless with limited supports. Update 09/27/2022:  No  changes at this time.  Update 10/02/2022:  Patient remains safe on the unit at this time.  Patient remains psychotic at this time.  APS report has been made, however, no follow up from the caseworker on her case.  No safe discharge identified.  CSW has requested that application for Medicaid and disability be completed.   Update 10/07/22: No changes at this time 10/12/22: No changes at this time Update 10/18/22: No changes at this time Update 10/23/22: No changes at this time Update 10/23/22: No changes at this time    Reason for Continuation of Hospitalization: Hallucinations Mania Medication stabilization   Estimated Length of Stay:  Update 6/30: 1-7 days Update 09/27/2022:  TBD Update 10/02/2022:  No changes at this time. UpdaUpdate 10/18/22: No changes at this time te 10/07/22: No changes at this time Update 10/18/22: No changes at this time Update 10/23/22: No changes at this time    Last 3 [place] Suicide Severity Risk Score: Flowsheet Row Admission (Current) from 08/22/2022 in [facility] ED from 08/17/2022 in [facility] Emergency Department at [facility] ED from 08/12/2022 in [facility] Emergency Department at [facility]  C-SSRS RISK CATEGORY Low Risk No Risk No Risk       Last [facility] 2/9 Scores:     No data to display          Scribe for Treatment Team: Elza Rafter, Theresia Majors 10/23/2022 4:15 PM

## 2022-10-23 NOTE — Progress Notes (Signed)
   10/23/22 1958  Psych Admission Type (Psych Patients Only)  Admission Status Voluntary  Psychosocial Assessment  Patient Complaints None  Eye Contact Brief  Facial Expression Flat  Affect Flat  Speech Slurred  Interaction Minimal;No initiation  Motor Activity Shuffling;Slow  Appearance/Hygiene Layered clothes  Behavior Characteristics Cooperative  Mood Pleasant  Thought Process  Coherency Disorganized  Content Preoccupation  Delusions None reported or observed  Perception Hallucinations  Hallucination Auditory  Judgment Impaired  Confusion Mild  Danger to Self  Current suicidal ideation? Denies

## 2022-10-23 NOTE — Group Note (Signed)
Date:  10/23/2022 Time:  9:27 PM  Group Topic/Focus:  Self Esteem Action Plan:   The focus of this group is to help patients create a plan to continue to build self-esteem after discharge.    Participation Level:  None  Insight: None  Engagement in Group:  None  Modes of Intervention:  Discussion  Additional Comments:    Betty Henderson 10/23/2022, 9:27 PM

## 2022-10-23 NOTE — Progress Notes (Signed)
   10/23/22 0700  Psych Admission Type (Psych Patients Only)  Admission Status Voluntary  Psychosocial Assessment  Patient Complaints None  Eye Contact Brief  Facial Expression Flat  Affect Flat  Speech Slurred  Interaction Minimal  Motor Activity Shuffling;Slow  Appearance/Hygiene Layered clothes  Behavior Characteristics Cooperative  Mood Pleasant  Thought Process  Coherency Disorganized  Content Preoccupation  Delusions None reported or observed  Perception Hallucinations  Hallucination Auditory  Judgment Impaired  Confusion Mild  Danger to Self  Current suicidal ideation? Denies  Agreement Not to Harm Self Yes  Danger to Others  Danger to Others None reported or observed  Danger to Others Abnormal  Harmful Behavior to others No threats or harm toward other people  Destructive Behavior No threats or harm toward property   Patient attended all group. Minimal interaction with staff and peers. Denies SI/AVH/HI.

## 2022-10-24 DIAGNOSIS — F250 Schizoaffective disorder, bipolar type: Secondary | ICD-10-CM | POA: Diagnosis not present

## 2022-10-24 NOTE — Plan of Care (Signed)
  Problem: Education: Goal: Knowledge of General Education information will improve Description: Including pain rating scale, medication(s)/side effects and non-pharmacologic comfort measures Outcome: Progressing   Problem: Health Behavior/Discharge Planning: Goal: Ability to manage health-related needs will improve Outcome: Progressing   Problem: Clinical Measurements: Goal: Ability to maintain clinical measurements within normal limits will improve Outcome: Progressing Goal: Diagnostic test results will improve Outcome: Progressing   Problem: Nutrition: Goal: Adequate nutrition will be maintained Outcome: Progressing

## 2022-10-24 NOTE — BHH Counselor (Signed)
CSW was contacted by DSS, stating that pt's caseworker with the state is Rodena Medin ([phone])  Reynaldo Minium, MSW, [facility] 10/24/2022 10:46 AM

## 2022-10-24 NOTE — Group Note (Signed)
Date:  10/24/2022 Time:  10:09 AM  Group Topic/Focus:  Making Healthy Choices:   The focus of this group is to help patients identify negative/unhealthy choices they were using prior to admission and identify positive/healthier coping strategies to replace them upon discharge.    Participation Level:  Did Not Attend   Betty Henderson 10/24/2022, 10:09 AM

## 2022-10-24 NOTE — Progress Notes (Signed)
[facility] MD Progress Note  10/24/2022 1:39 PM Betty Henderson  MRN:  [id number] Subjective: Betty Henderson is seen on rounds.  She has been compliant with her medication.  No side effects.  Nurses report no issues.  She has been in good controls and pleasant and cooperative on the unit.  No evidence of delusions at this time.  Still waiting for DSS and placement. Principal Problem: Schizoaffective disorder, bipolar type (HCC) Diagnosis: Principal Problem:   Schizoaffective disorder, bipolar type (HCC)  Total Time spent with patient: 15 minutes  Past Psychiatric History: Schizoaffective disorder, bipolar type  Past Medical History:  Past Medical History:  Diagnosis Date   Anemia    Arthritis    Chronic pain    Drug-seeking behavior    Malingering    Osteopetrosis    Psychosis (HCC)    Schizoaffective disorder, bipolar type (HCC)     Past Surgical History:  Procedure Laterality Date   MOUTH SURGERY     TUBAL LIGATION     Family History:  Family History  Family history unknown: Yes   Family Psychiatric  History: Unremarkable Social History:  Social History   Substance and Sexual Activity  Alcohol Use Not Currently   Comment: 1 cocktail 3 weeks ago     Social History   Substance and Sexual Activity  Drug Use Not Currently   Types: Cocaine   Comment: states "it's legal though"    Social History   Socioeconomic History   Marital status: Single    Spouse name: Not on file   Number of children: Not on file   Years of education: Not on file   Highest education level: Not on file  Occupational History   Not on file  Tobacco Use   Smoking status: Every Day    Current packs/day: 0.50    Types: Cigarettes   Smokeless tobacco: Never  Vaping Use   Vaping status: Never Used  Substance and Sexual Activity   Alcohol use: Not Currently    Comment: 1 cocktail 3 weeks ago   Drug use: Not Currently    Types: Cocaine    Comment: states "it's legal though"   Sexual activity: Never   Other Topics Concern   Not on file  Social History Narrative   Not on file   Social Determinants of Health   Financial Resource Strain: Not on file  Food Insecurity: Food Insecurity Present (08/22/2022)   Hunger Vital Sign    Worried About Running Out of Food in the Last Year: Sometimes true    Ran Out of Food in the Last Year: Sometimes true  Transportation Needs: Patient Unable To Answer (08/22/2022)   PRAPARE - Transportation    Lack of Transportation (Medical): Patient unable to answer    Lack of Transportation (Non-Medical): Patient unable to answer  Recent Concern: Transportation Needs - Unmet Transportation Needs (06/13/2022)   Received from [facility], [facility]   [facility] - Transportation    Lack of Transportation (Medical): Not on file    Lack of Transportation (Non-Medical): Yes  Physical Activity: Not on file  Stress: Not on file  Social Connections: Unknown (08/06/2021)   Received from [facility], [facility]   Social Network    Social Network: Not on file   Additional Social History:                         Sleep: Good  Appetite:  Good  Current Medications: Current Facility-Administered  Medications  Medication Dose Route Frequency Provider Last Rate Last Admin   acetaminophen (TYLENOL) tablet 650 mg  650 mg Oral Q6H PRN [name], DO   650 mg at 10/21/22 2340   alum & mag hydroxide-simeth (MAALOX/MYLANTA) 200-200-20 MG/5ML suspension 30 mL  30 mL Oral Q4H PRN [name], DO   30 mL at 09/20/22 1142   diphenhydrAMINE (BENADRYL) capsule 50 mg  50 mg Oral Q6H PRN [name], DO   50 mg at 09/29/22 2254   Or   diphenhydrAMINE (BENADRYL) injection 50 mg  50 mg Intramuscular Q6H PRN [name], DO       haloperidol (HALDOL) tablet 5 mg  5 mg Oral Q6H PRN [name], DO   5 mg at 09/28/22 2149   Or   haloperidol lactate (HALDOL) injection 5 mg  5 mg Intramuscular Q6H PRN [name], DO       ibuprofen (ADVIL) tablet 600 mg  600 mg Oral Q6H PRN [name], [name], MD   600 mg at 10/17/22 2125   LORazepam (ATIVAN) tablet 1 mg  1 mg Oral TID PRN [name], DO   1 mg at 09/27/22 0025   magnesium hydroxide (MILK OF MAGNESIA) suspension 30 mL  30 mL Oral Daily PRN [name], DO       [START ON 11/13/2022] paliperidone (INVEGA SUSTENNA) injection 156 mg  156 mg Intramuscular Once [name], MD       paliperidone (INVEGA) 24 hr tablet 3 mg  3 mg Oral QHS [name], DO   3 mg at 10/23/22 2102   traZODone (DESYREL) tablet 50 mg  50 mg Oral QHS PRN [name], DO   50 mg at 10/23/22 2102    Lab Results: No results found for this or any previous visit (from the past 48 hour(s)).  Blood Alcohol level:  Lab Results  Component Value Date   ETH <10 08/17/2022   ETH <10 01/11/2021    Metabolic Disorder Labs: No results found for: "HGBA1C", "MPG" No results found for: "PROLACTIN" No results found for: "CHOL", "TRIG", "HDL", "CHOLHDL", "VLDL", "LDLCALC"  Physical Findings: AIMS:  , ,  ,  ,    CIWA:    COWS:     Musculoskeletal: Strength & Muscle Tone: within normal limits Gait & Station: normal Patient leans: N/A  Psychiatric Specialty Exam:  Presentation  General Appearance:  Casual; Neat  Eye Contact: Fair  Speech: Slow; Slurred  Speech Volume: Decreased  Handedness: Right   Mood and Affect  Mood: Anxious  Affect: Inappropriate   Thought Process  Thought Processes: Disorganized  Descriptions of Associations:Loose  Orientation:Partial  Thought Content:Illogical; Rumination  History of Schizophrenia/Schizoaffective disorder:No data recorded Duration of Psychotic Symptoms:No data recorded Hallucinations:No data recorded Ideas of Reference:Paranoia  Suicidal Thoughts:No data recorded Homicidal Thoughts:No data recorded  Sensorium  Memory: Immediate Fair; Remote  Poor  Judgment: Poor  Insight: Poor   Executive Functions  Concentration: Poor  Attention Span: Fair  Recall: [employer] of Knowledge: Fair  Language: Fair   Psychomotor Activity  Psychomotor Activity:No data recorded  Assets  Assets: Communication Skills   Sleep  Sleep:No data recorded    Blood pressure 119/89, pulse 77, temperature 97.6 F (36.4 C), resp. rate 16, height 5\' 6"  (1.676 m), weight 64 kg, SpO2 100%. Body mass index is 22.76 kg/m.   Treatment Plan Summary: Daily contact with patient to assess and evaluate symptoms  and progress in treatment, Medication management, and Plan continue current medications.  [name], DO 10/24/2022, 1:39 PM

## 2022-10-24 NOTE — Group Note (Signed)
Recreation Therapy Group Note   Group Topic:Relaxation  Group Date: 10/24/2022 Start Time: 1400 End Time: 1420 Facilitators: Rosina Lowenstein, LRT, CTRS Location:  Dayroom  Group Description: Meditation. LRT asks patients their current level of stress/anxiety from 1-10, with 10 being the highest. LRT educated on the benefits of mindfulness and how it can apply to everyday life post-discharge. LRT and pt's followed along to an audio script of a "guided meditation" video. LRT asked pt their level of stress and anxiety once the prompt was finished. LRT facilitated post-activity processing to gain feedback on session. Patients then had the option to go outside to the courtyard for fresh air and sunlight.    Goal Area(s) Addressed:  Patient will practice using relaxation technique. Patient will identify a new coping skill.  Patient will follow multistep directions to reduce anxiety and stress.   Affect/Mood: N/A   Participation Level: Did not attend    Clinical Observations/Individualized Feedback: Betty Henderson did not attend group. Pt was only interested in going outside, however it was too hot.  Plan: Continue to engage patient in RT group sessions 2-3x/week.   Rosina Lowenstein, LRT, CTRS 10/24/2022 2:36 PM

## 2022-10-25 DIAGNOSIS — F250 Schizoaffective disorder, bipolar type: Secondary | ICD-10-CM | POA: Diagnosis not present

## 2022-10-25 NOTE — Group Note (Signed)
Date:  10/25/2022 Time:  3:45 PM  Group Topic/Focus:  Goals Group:   The focus of this group is to help patients establish daily goals to achieve during treatment and discuss how the patient can incorporate goal setting into their daily lives to aide in recovery.    Participation Level:  Active  Participation Quality:  Appropriate  Affect:  Appropriate  Cognitive:  Appropriate  Insight: Appropriate  Engagement in Group:  Engaged  Modes of Intervention:  Activity and Discussion  Additional Comments:  None   Betty Henderson 10/25/2022, 3:45 PM

## 2022-10-25 NOTE — Group Note (Signed)
Recreation Therapy Group Note   Group Topic:Other  Group Date: 10/25/2022 Start Time: 0900 End Time: 0955 Facilitators: Rosina Lowenstein, LRT, CTRS Location: Courtyard  Group Description: Music Reminisce. LRT encouraged patients to think of their favorite song(s) that reminded them of a positive memory or time in their life. LRT encouraged patient to talk about that memory aloud to the group. LRT played the song through a speaker for all to hear. LRT and patients discussed how thinking of a positive memory or time in their life can be used as a coping skill in everyday life post discharge.    Goal Area(s) Addressed: Patient will increase verbal communication by conversing with peers. Patient will contribute to group discussion with minimal prompting. Patient will reminisce a positive memory or moment in their life.    Affect/Mood: Appropriate   Participation Level: Active and Engaged   Participation Quality: Independent   Behavior: Appropriate, Calm, and Cooperative   Speech/Thought Process: Coherent   Insight: Good   Judgement: Good   Modes of Intervention: Activity   Patient Response to Interventions:  Attentive, Engaged, Interested , and Receptive   Education Outcome:  Acknowledges education   Clinical Observations/Individualized Feedback: Antoria was active in their participation of session activities and group discussion. Pt identified "Oh happy day is my favorite song." Pt was very talkative during group to LRT, NT and peers. Pt interacted well duration of session.   Plan: Continue to engage patient in RT group sessions 2-3x/week.   Rosina Lowenstein, LRT, CTRS 10/25/2022 11:50 AM

## 2022-10-25 NOTE — Group Note (Signed)
Date:  10/25/2022 Time:  10:19 PM  Group Topic/Focus:  Building Self Esteem:   The Focus of this group is helping patients become aware of the effects of self-esteem on their lives, the things they and others do that enhance or undermine their self-esteem, seeing the relationship between their level of self-esteem and the choices they make and learning ways to enhance self-esteem. Goals Group:   The focus of this group is to help patients establish daily goals to achieve during treatment and discuss how the patient can incorporate goal setting into their daily lives to aide in recovery. Identifying Needs:   The focus of this group is to help patients identify their personal needs that have been historically problematic and identify healthy behaviors to address their needs. Making Healthy Choices:   The focus of this group is to help patients identify negative/unhealthy choices they were using prior to admission and identify positive/healthier coping strategies to replace them upon discharge. Personal Choices and Values:   The focus of this group is to help patients assess and explore the importance of values in their lives, how their values affect their decisions, how they express their values and what opposes their expression. Self Care:   The focus of this group is to help patients understand the importance of self-care in order to improve or restore emotional, physical, spiritual, interpersonal, and financial health.    Participation Level:  Active  Participation Quality:  Appropriate  Affect:  Appropriate  Cognitive:  Appropriate  Insight: Appropriate  Engagement in Group:  Engaged  Modes of Intervention:  Discussion  Additional Comments:    Betty Henderson 10/25/2022, 10:19 PM

## 2022-10-25 NOTE — Group Note (Signed)
LCSW Group Therapy Note   Group Date: 10/25/2022 Start Time: 1315 End Time: 1345   Type of Therapy and Topic:  Group Therapy: Boundaries  Participation Level:  Did Not Attend  Description of Group: This group will address the use of boundaries in their personal lives. Patients will explore why boundaries are important, the difference between healthy and unhealthy boundaries, and negative and postive outcomes of different boundaries and will look at how boundaries can be crossed.  Patients will be encouraged to identify current boundaries in their own lives and identify what kind of boundary is being set. Facilitators will guide patients in utilizing problem-solving interventions to address and correct types boundaries being used and to address when no boundary is being used. Understanding and applying boundaries will be explored and addressed for obtaining and maintaining a balanced life. Patients will be encouraged to explore ways to assertively make their boundaries and needs known to significant others in their lives, using other group members and facilitator for role play, support, and feedback.  Therapeutic Goals:  1.  Patient will identify areas in their life where setting clear boundaries could be  used to improve their life.  2.  Patient will identify signs/triggers that a boundary is not being respected. 3.  Patient will identify two ways to set boundaries in order to achieve balance in  their lives: 4.  Patient will demonstrate ability to communicate their needs and set boundaries  through discussion and/or role plays  Summary of Patient Progress:  X  Therapeutic Modalities:   Cognitive Behavioral Therapy Solution-Focused Therapy  Elza Rafter, LCSWA 10/25/2022  1:57 PM

## 2022-10-25 NOTE — Plan of Care (Signed)
Calm and cooperative. Visible on the unit. Po med complaint. No behavior issues noted. Q 15 min checks maintained for safety. Denies SI/HI/AVH.  No c/o pain/discomfort noted.   Problem: Elimination: Goal: Will not experience complications related to bowel motility Outcome: Progressing Goal: Will not experience complications related to urinary retention Outcome: Progressing   Problem: Pain Managment: Goal: General experience of comfort will improve Outcome: Progressing   Problem: Safety: Goal: Ability to remain free from injury will improve Outcome: Progressing   Problem: Skin Integrity: Goal: Risk for impaired skin integrity will decrease Outcome: Progressing

## 2022-10-25 NOTE — Plan of Care (Signed)
  Problem: Education: Goal: Knowledge of General Education information will improve Description: Including pain rating scale, medication(s)/side effects and non-pharmacologic comfort measures Outcome: Progressing   Problem: Health Behavior/Discharge Planning: Goal: Ability to manage health-related needs will improve Outcome: Progressing   Problem: Clinical Measurements: Goal: Ability to maintain clinical measurements within normal limits will improve Outcome: Progressing Goal: Diagnostic test results will improve Outcome: Progressing   Problem: Nutrition: Goal: Adequate nutrition will be maintained Outcome: Progressing   Problem: Coping: Goal: Level of anxiety will decrease Outcome: Progressing   Problem: Elimination: Goal: Will not experience complications related to bowel motility Outcome: Progressing Goal: Will not experience complications related to urinary retention Outcome: Progressing   Problem: Pain Managment: Goal: General experience of comfort will improve Outcome: Progressing   Problem: Safety: Goal: Ability to remain free from injury will improve Outcome: Progressing   Problem: Skin Integrity: Goal: Risk for impaired skin integrity will decrease Outcome: Progressing

## 2022-10-25 NOTE — Progress Notes (Signed)
   10/25/22 0800  Psych Admission Type (Psych Patients Only)  Admission Status Voluntary  Psychosocial Assessment  Patient Complaints None  Eye Contact Intense  Facial Expression Blank  Affect Appropriate to circumstance  Speech Incoherent;Slurred  Interaction Minimal  Motor Activity Shuffling  Appearance/Hygiene Layered clothes  Behavior Characteristics Cooperative  Mood Pleasant  Thought Process  Coherency Loose associations;Tangential  Content WDL  Delusions None reported or observed  Perception Hallucinations  Hallucination Auditory;Visual  Judgment Impaired  Confusion Mild  Danger to Self  Current suicidal ideation? Denies

## 2022-10-25 NOTE — Plan of Care (Signed)
  Problem: Education: Goal: Knowledge of General Education information will improve Description: Including pain rating scale, medication(s)/side effects and non-pharmacologic comfort measures Outcome: Progressing   Problem: Health Behavior/Discharge Planning: Goal: Ability to manage health-related needs will improve Outcome: Progressing   Problem: Clinical Measurements: Goal: Ability to maintain clinical measurements within normal limits will improve Outcome: Progressing Goal: Diagnostic test results will improve Outcome: Progressing   Problem: Nutrition: Goal: Adequate nutrition will be maintained Outcome: Progressing   Problem: Coping: Goal: Level of anxiety will decrease Outcome: Progressing   Problem: Elimination: Goal: Will not experience complications related to bowel motility Outcome: Progressing Goal: Will not experience complications related to urinary retention Outcome: Progressing   Problem: Skin Integrity: Goal: Risk for impaired skin integrity will decrease Outcome: Progressing

## 2022-10-25 NOTE — Progress Notes (Signed)
[facility] MD Progress Note  10/25/2022 1:56 PM Betty Henderson  MRN:  [id number] Subjective: Betty Henderson is seen on rounds.  She is doing well.  Still waiting to hear from DSS for placement Principal Problem: Schizoaffective disorder, bipolar type (HCC) Diagnosis: Principal Problem:   Schizoaffective disorder, bipolar type (HCC)  Total Time spent with patient: 15 minutes  Past Psychiatric History: Schizoaffective disorder  Past Medical History:  Past Medical History:  Diagnosis Date   Anemia    Arthritis    Chronic pain    Drug-seeking behavior    Malingering    Osteopetrosis    Psychosis (HCC)    Schizoaffective disorder, bipolar type (HCC)     Past Surgical History:  Procedure Laterality Date   MOUTH SURGERY     TUBAL LIGATION     Family History:  Family History  Family history unknown: Yes   Family Psychiatric  History: Unremarkable Social History:  Social History   Substance and Sexual Activity  Alcohol Use Not Currently   Comment: 1 cocktail 3 weeks ago     Social History   Substance and Sexual Activity  Drug Use Not Currently   Types: Cocaine   Comment: states "it's legal though"    Social History   Socioeconomic History   Marital status: Single    Spouse name: Not on file   Number of children: Not on file   Years of education: Not on file   Highest education level: Not on file  Occupational History   Not on file  Tobacco Use   Smoking status: Every Day    Current packs/day: 0.50    Types: Cigarettes   Smokeless tobacco: Never  Vaping Use   Vaping status: Never Used  Substance and Sexual Activity   Alcohol use: Not Currently    Comment: 1 cocktail 3 weeks ago   Drug use: Not Currently    Types: Cocaine    Comment: states "it's legal though"   Sexual activity: Never  Other Topics Concern   Not on file  Social History Narrative   Not on file   Social Determinants of Health   Financial Resource Strain: Not on file  Food Insecurity: Food  Insecurity Present (08/22/2022)   Hunger Vital Sign    Worried About Running Out of Food in the Last Year: Sometimes true    Ran Out of Food in the Last Year: Sometimes true  Transportation Needs: Patient Unable To Answer (08/22/2022)   PRAPARE - Transportation    Lack of Transportation (Medical): Patient unable to answer    Lack of Transportation (Non-Medical): Patient unable to answer  Recent Concern: Transportation Needs - Unmet Transportation Needs (06/13/2022)   Received from [facility], [facility]   [facility] - Transportation    Lack of Transportation (Medical): Not on file    Lack of Transportation (Non-Medical): Yes  Physical Activity: Not on file  Stress: Not on file  Social Connections: Unknown (08/06/2021)   Received from [facility], [place], [facility]   Social Network    Social Network: Not on file   Additional Social History:                         Sleep: Good  Appetite:  Good  Current Medications: Current Facility-Administered Medications  Medication Dose Route Frequency Provider Last Rate Last Admin   acetaminophen (TYLENOL) tablet 650 mg  650 mg Oral Q6H PRN [name], DO   650 mg at 10/21/22  2340   alum & mag hydroxide-simeth (MAALOX/MYLANTA) 200-200-20 MG/5ML suspension 30 mL  30 mL Oral Q4H PRN [name], DO   30 mL at 09/20/22 1142   diphenhydrAMINE (BENADRYL) capsule 50 mg  50 mg Oral Q6H PRN [name], DO   50 mg at 09/29/22 2254   Or   diphenhydrAMINE (BENADRYL) injection 50 mg  50 mg Intramuscular Q6H PRN [name], DO       haloperidol (HALDOL) tablet 5 mg  5 mg Oral Q6H PRN [name], DO   5 mg at 09/28/22 2149   Or   haloperidol lactate (HALDOL) injection 5 mg  5 mg Intramuscular Q6H PRN [name], DO       ibuprofen (ADVIL) tablet 600 mg  600 mg Oral Q6H PRN [name], [name], MD   600 mg at 10/17/22 2125   LORazepam (ATIVAN) tablet 1 mg  1 mg Oral TID PRN  [name], DO   1 mg at 09/27/22 0025   magnesium hydroxide (MILK OF MAGNESIA) suspension 30 mL  30 mL Oral Daily PRN [name], DO       [START ON 11/13/2022] paliperidone (INVEGA SUSTENNA) injection 156 mg  156 mg Intramuscular Once [name], MD       paliperidone (INVEGA) 24 hr tablet 3 mg  3 mg Oral QHS [name], DO   3 mg at 10/24/22 2120   traZODone (DESYREL) tablet 50 mg  50 mg Oral QHS PRN [name], DO   50 mg at 10/24/22 2120    Lab Results: No results found for this or any previous visit (from the past 48 hour(s)).  Blood Alcohol level:  Lab Results  Component Value Date   ETH <10 08/17/2022   ETH <10 01/11/2021    Metabolic Disorder Labs: No results found for: "HGBA1C", "MPG" No results found for: "PROLACTIN" No results found for: "CHOL", "TRIG", "HDL", "CHOLHDL", "VLDL", "LDLCALC"  Physical Findings: AIMS:  , ,  ,  ,    CIWA:    COWS:     Musculoskeletal: Strength & Muscle Tone: within normal limits Gait & Station: normal Patient leans: N/A  Psychiatric Specialty Exam:  Presentation  General Appearance:  Casual; Neat  Eye Contact: Fair  Speech: Slow; Slurred  Speech Volume: Decreased  Handedness: Right   Mood and Affect  Mood: Anxious  Affect: Inappropriate   Thought Process  Thought Processes: Disorganized  Descriptions of Associations:Loose  Orientation:Partial  Thought Content:Illogical; Rumination  History of Schizophrenia/Schizoaffective disorder:No data recorded Duration of Psychotic Symptoms:No data recorded Hallucinations:No data recorded Ideas of Reference:Paranoia  Suicidal Thoughts:No data recorded Homicidal Thoughts:No data recorded  Sensorium  Memory: Immediate Fair; Remote Poor  Judgment: Poor  Insight: Poor   Executive Functions  Concentration: Poor  Attention Span: Fair  Recall: [employer] of  Knowledge: Fair  Language: Fair   Psychomotor Activity  Psychomotor Activity:No data recorded  Assets  Assets: Communication Skills   Sleep  Sleep:No data recorded    Blood pressure 116/73, pulse 79, temperature 98.9 F (37.2 C), resp. rate 16, height 5\' 6"  (1.676 m), weight 64 kg, SpO2 100%. Body mass index is 22.76 kg/m.   Treatment Plan Summary: Daily contact with patient to assess and evaluate symptoms and progress in treatment, Medication management, and Plan continue current medication  [name], DO 10/25/2022, 1:56 PM

## 2022-10-26 DIAGNOSIS — F250 Schizoaffective disorder, bipolar type: Secondary | ICD-10-CM | POA: Diagnosis not present

## 2022-10-26 NOTE — Group Note (Signed)
Date:  10/26/2022 Time:  10:48 PM  Group Topic/Focus:  Conflict Resolution:   The focus of this group is to discuss the conflict resolution process and how it may be used upon discharge. Crisis Planning:   The purpose of this group is to help patients create a crisis plan for use upon discharge or in the future, as needed. Dimensions of Wellness:   The focus of this group is to introduce the topic of wellness and discuss the role each dimension of wellness plays in total health.    Participation Level:  Minimal  Participation Quality:  Appropriate  Affect:  Appropriate  Engagement in Group:  Limited  Modes of Intervention:  Discussion  Additional Comments:    [name] 10/26/2022, 10:48 PM

## 2022-10-26 NOTE — Progress Notes (Signed)
[facility] MD Progress Note  10/26/2022 12:40 PM Betty Henderson  MRN:  [id number] Subjective: Betty Henderson is doing well.  She denies any side effects from her medications.  Nurses report no issues.  Still waiting on DSS. Principal Problem: Schizoaffective disorder, bipolar type (HCC) Diagnosis: Principal Problem:   Schizoaffective disorder, bipolar type (HCC)  Total Time spent with patient: 15 minutes  Past Psychiatric History: Schizoaffective disorder  Past Medical History:  Past Medical History:  Diagnosis Date   Anemia    Arthritis    Chronic pain    Drug-seeking behavior    Malingering    Osteopetrosis    Psychosis (HCC)    Schizoaffective disorder, bipolar type (HCC)     Past Surgical History:  Procedure Laterality Date   MOUTH SURGERY     TUBAL LIGATION     Family History:  Family History  Family history unknown: Yes   Family Psychiatric  History: Unremarkable Social History:  Social History   Substance and Sexual Activity  Alcohol Use Not Currently   Comment: 1 cocktail 3 weeks ago     Social History   Substance and Sexual Activity  Drug Use Not Currently   Types: Cocaine   Comment: states "it's legal though"    Social History   Socioeconomic History   Marital status: Single    Spouse name: Not on file   Number of children: Not on file   Years of education: Not on file   Highest education level: Not on file  Occupational History   Not on file  Tobacco Use   Smoking status: Every Day    Current packs/day: 0.50    Types: Cigarettes   Smokeless tobacco: Never  Vaping Use   Vaping status: Never Used  Substance and Sexual Activity   Alcohol use: Not Currently    Comment: 1 cocktail 3 weeks ago   Drug use: Not Currently    Types: Cocaine    Comment: states "it's legal though"   Sexual activity: Never  Other Topics Concern   Not on file  Social History Narrative   Not on file   Social Determinants of Health   Financial Resource Strain: Not on file   Food Insecurity: Food Insecurity Present (08/22/2022)   Hunger Vital Sign    Worried About Running Out of Food in the Last Year: Sometimes true    Ran Out of Food in the Last Year: Sometimes true  Transportation Needs: Patient Unable To Answer (08/22/2022)   PRAPARE - Transportation    Lack of Transportation (Medical): Patient unable to answer    Lack of Transportation (Non-Medical): Patient unable to answer  Recent Concern: Transportation Needs - Unmet Transportation Needs (06/13/2022)   Received from [facility], [facility]   [facility] - Transportation    Lack of Transportation (Medical): Not on file    Lack of Transportation (Non-Medical): Yes  Physical Activity: Not on file  Stress: Not on file  Social Connections: Unknown (08/06/2021)   Received from [facility], [facility]   Social Network    Social Network: Not on file   Additional Social History:                         Sleep: Good  Appetite:  Good  Current Medications: Current Facility-Administered Medications  Medication Dose Route Frequency Provider Last Rate Last Admin   acetaminophen (TYLENOL) tablet 650 mg  650 mg Oral Q6H PRN [name], DO  650 mg at 10/21/22 2340   alum & mag hydroxide-simeth (MAALOX/MYLANTA) 200-200-20 MG/5ML suspension 30 mL  30 mL Oral Q4H PRN [name], DO   30 mL at 09/20/22 1142   diphenhydrAMINE (BENADRYL) capsule 50 mg  50 mg Oral Q6H PRN [name], DO   50 mg at 09/29/22 2254   Or   diphenhydrAMINE (BENADRYL) injection 50 mg  50 mg Intramuscular Q6H PRN [name], DO       haloperidol (HALDOL) tablet 5 mg  5 mg Oral Q6H PRN [name], DO   5 mg at 09/28/22 2149   Or   haloperidol lactate (HALDOL) injection 5 mg  5 mg Intramuscular Q6H PRN [name], DO       ibuprofen (ADVIL) tablet 600 mg  600 mg Oral Q6H PRN [name], [name], MD   600 mg at 10/17/22 2125   LORazepam (ATIVAN) tablet 1 mg   1 mg Oral TID PRN [name], DO   1 mg at 09/27/22 0025   magnesium hydroxide (MILK OF MAGNESIA) suspension 30 mL  30 mL Oral Daily PRN [name], DO       [START ON 11/13/2022] paliperidone (INVEGA SUSTENNA) injection 156 mg  156 mg Intramuscular Once [name], MD       paliperidone (INVEGA) 24 hr tablet 3 mg  3 mg Oral QHS [name], DO   3 mg at 10/25/22 2123   traZODone (DESYREL) tablet 50 mg  50 mg Oral QHS PRN [name], DO   50 mg at 10/25/22 2123    Lab Results: No results found for this or any previous visit (from the past 48 hour(s)).  Blood Alcohol level:  Lab Results  Component Value Date   ETH <10 08/17/2022   ETH <10 01/11/2021    Metabolic Disorder Labs: No results found for: "HGBA1C", "MPG" No results found for: "PROLACTIN" No results found for: "CHOL", "TRIG", "HDL", "CHOLHDL", "VLDL", "LDLCALC"  Physical Findings: AIMS:  , ,  ,  ,    CIWA:    COWS:     Musculoskeletal: Strength & Muscle Tone: within normal limits Gait & Station: normal Patient leans: N/A  Psychiatric Specialty Exam:  Presentation  General Appearance:  Casual; Neat  Eye Contact: Fair  Speech: Slow; Slurred  Speech Volume: Decreased  Handedness: Right   Mood and Affect  Mood: Anxious  Affect: Inappropriate   Thought Process  Thought Processes: Disorganized  Descriptions of Associations:Loose  Orientation:Partial  Thought Content:Illogical; Rumination  History of Schizophrenia/Schizoaffective disorder:No data recorded Duration of Psychotic Symptoms:No data recorded Hallucinations:No data recorded Ideas of Reference:Paranoia  Suicidal Thoughts:No data recorded Homicidal Thoughts:No data recorded  Sensorium  Memory: Immediate Fair; Remote Poor  Judgment: Poor  Insight: Poor   Executive Functions  Concentration: Poor  Attention Span: Fair  Recall: [employer] of  Knowledge: Fair  Language: Fair   Psychomotor Activity  Psychomotor Activity:No data recorded  Assets  Assets: Communication Skills   Sleep  Sleep:No data recorded    Blood pressure 100/75, pulse 83, temperature 98.3 F (36.8 C), resp. rate 18, height 5\' 6"  (1.676 m), weight 64 kg, SpO2 100%. Body mass index is 22.76 kg/m.   Treatment Plan Summary: Daily contact with patient to assess and evaluate symptoms and progress in treatment, Medication management, and Plan continue current medications  [name], DO 10/26/2022, 12:40 PM

## 2022-10-26 NOTE — Progress Notes (Signed)
Patient present in the dayroom for breakfast. Good appetite noted. Patient is pleasant and cooperative. Flat affect.  Denies SI/HI. Denies pain. No scheduled medications this shift. 15 min checks in place for safety.  Patient isolated to room most of the morning.  Present in dayroom later in the afternoon. Minimal interaction with peers. No behavioral issues.

## 2022-10-26 NOTE — Plan of Care (Signed)
  Problem: Nutrition: Goal: Adequate nutrition will be maintained Outcome: Progressing   Problem: Coping: Goal: Level of anxiety will decrease Outcome: Progressing   Problem: Elimination: Goal: Will not experience complications related to bowel motility Outcome: Progressing   

## 2022-10-26 NOTE — Group Note (Signed)
[facility] LCSW Group Therapy Note   Group Date: 10/26/2022 Start Time: 1310 End Time: 1400   Type of Therapy/Topic:  Group Therapy:  Balance in Life  Participation Level:  Did Not Attend   Description of Group:    This group will address the concept of balance and how it feels and looks when one is unbalanced. Patients will be encouraged to process areas in their lives that are out of balance, and identify reasons for remaining unbalanced. Facilitators will guide patients utilizing problem- solving interventions to address and correct the stressor making their life unbalanced. Understanding and applying boundaries will be explored and addressed for obtaining  and maintaining a balanced life. Patients will be encouraged to explore ways to assertively make their unbalanced needs known to significant others in their lives, using other group members and facilitator for support and feedback.  Therapeutic Goals: Patient will identify two or more emotions or situations they have that consume much of in their lives. Patient will identify signs/triggers that life has become out of balance:  Patient will identify two ways to set boundaries in order to achieve balance in their lives:  Patient will demonstrate ability to communicate their needs through discussion and/or role plays  Summary of Patient Progress: Did not attend   Marshell Levan, LCSW

## 2022-10-26 NOTE — Plan of Care (Signed)

## 2022-10-26 NOTE — Group Note (Signed)
Date:  10/26/2022 Time:  2:36 PM  Group Topic/Focus:  Listen to music and socializing    Participation Level:  Did Not Attend   Rodena Goldmann 10/26/2022, 2:36 PM

## 2022-10-26 NOTE — Plan of Care (Signed)
Calm and cooperative. Visible on the unit. Limited interaction with peers and staff. Po med compliant. Q 15 min checks maintained for safety. Denies SI/HI/AV/H. No c/o pain/discomfort noted.   Problem: Coping: Goal: Level of anxiety will decrease Outcome: Progressing   Problem: Pain Managment: Goal: General experience of comfort will improve Outcome: Progressing   Problem: Safety: Goal: Ability to remain free from injury will improve Outcome: Progressing   Problem: Skin Integrity: Goal: Risk for impaired skin integrity will decrease Outcome: Progressing

## 2022-10-27 DIAGNOSIS — F250 Schizoaffective disorder, bipolar type: Secondary | ICD-10-CM | POA: Diagnosis not present

## 2022-10-27 NOTE — Group Note (Signed)
Date:  10/27/2022 Time:  2:41 PM  Group Topic/Focus:  Healthy Personal Boundaries  The purpose of this group was to identity the differences between the different groups of personal boundaries such as rigid, porous and healthy boundaries. Also indentifying which boundary was theirs.    Participation Level:  Active  Participation Quality:  Appropriate  Affect:  Appropriate  Cognitive:  Alert and Appropriate  Insight: Appropriate and Improving   Engagement in Group:  Engaged  Modes of Intervention:  Activity and Discussion  Additional Comments:    [name] 10/27/2022, 2:41 PM

## 2022-10-27 NOTE — Progress Notes (Signed)
Patient is pleasant and cooperative. Slept well thus far this shift. Denies SI/HI. No complaints or concerns voiced.

## 2022-10-27 NOTE — Plan of Care (Signed)
  Problem: Elimination: Goal: Will not experience complications related to bowel motility Outcome: Progressing   Problem: Safety: Goal: Ability to remain free from injury will improve Outcome: Progressing   Problem: Skin Integrity: Goal: Risk for impaired skin integrity will decrease Outcome: Progressing   

## 2022-10-27 NOTE — Group Note (Signed)
Date:  10/27/2022 Time:  10:52 PM  Group Topic/Focus:  Building Self Esteem:   The Focus of this group is helping patients become aware of the effects of self-esteem on their lives, the things they and others do that enhance or undermine their self-esteem, seeing the relationship between their level of self-esteem and the choices they make and learning ways to enhance self-esteem. Coping With Mental Health Crisis:   The purpose of this group is to help patients identify strategies for coping with mental health crisis.  Group discusses possible causes of crisis and ways to manage them effectively. Developing a Wellness Toolbox:   The focus of this group is to help patients develop a "wellness toolbox" with skills and strategies to promote recovery upon discharge. Goals Group:   The focus of this group is to help patients establish daily goals to achieve during treatment and discuss how the patient can incorporate goal setting into their daily lives to aide in recovery. Healthy Communication:   The focus of this group is to discuss communication, barriers to communication, as well as healthy ways to communicate with others. Making Healthy Choices:   The focus of this group is to help patients identify negative/unhealthy choices they were using prior to admission and identify positive/healthier coping strategies to replace them upon discharge. Self Care:   The focus of this group is to help patients understand the importance of self-care in order to improve or restore emotional, physical, spiritual, interpersonal, and financial health. Self Esteem Action Plan:   The focus of this group is to help patients create a plan to continue to build self-esteem after discharge. Wellness Toolbox:   The focus of this group is to discuss various aspects of wellness, balancing those aspects and exploring ways to increase the ability to experience wellness.  Patients will create a wellness toolbox for use upon  discharge.    Participation Level:  Active  Participation Quality:  Appropriate  Affect:  Appropriate  Cognitive:  Appropriate  Insight: Improving  Engagement in Group:  Engaged and Supportive  Modes of Intervention:  Discussion  Additional Comments:    Betty Henderson 10/27/2022, 10:52 PM

## 2022-10-27 NOTE — Progress Notes (Signed)
Patient was admitted involuntarily on Aug 22, 2022 for worsening psychosis with a dx of schizoaffective disorder, bipolar type. She is currently a voluntary admission. Affect flat. Minimal interaction. Mood preoccupied. She is A+O x 3. Appetite good. Urinary incontinence was an issue on the previous shift. Patient stated that she couldn't make it to the bathroom due to pain in her legs. She did receive Tylenol PRN for bilateral leg pain yesterday on day shift.   She denies SI/HI/AVH. She denies anxiety and depression. Patient prefers to isolate in her room but will appear for meals and group. No scheduled medications on day shift but she is compliant with care. Patient was actively involved in group and was appropriate.  Discharge planning ongoing.   Q15 minute unit checks in place.

## 2022-10-27 NOTE — Progress Notes (Signed)
[facility] MD Progress Note  10/27/2022 11:22 AM Betty Henderson  MRN:  [id number] Subjective: Betty Henderson is seen on rounds.  Still waiting to hear from DSS.  She has been in good controls.  No problems on the unit.  Compliant with medications.  Nurses report no issues. Principal Problem: Schizoaffective disorder, bipolar type (HCC) Diagnosis: Principal Problem:   Schizoaffective disorder, bipolar type (HCC)  Total Time spent with patient: 15 minutes  Past Psychiatric History: Schizoaffective bipolar type  Past Medical History:  Past Medical History:  Diagnosis Date   Anemia    Arthritis    Chronic pain    Drug-seeking behavior    Malingering    Osteopetrosis    Psychosis (HCC)    Schizoaffective disorder, bipolar type (HCC)     Past Surgical History:  Procedure Laterality Date   MOUTH SURGERY     TUBAL LIGATION     Family History:  Family History  Family history unknown: Yes   Family Psychiatric  History: Unremarkable Social History:  Social History   Substance and Sexual Activity  Alcohol Use Not Currently   Comment: 1 cocktail 3 weeks ago     Social History   Substance and Sexual Activity  Drug Use Not Currently   Types: Cocaine   Comment: states "it's legal though"    Social History   Socioeconomic History   Marital status: Single    Spouse name: Not on file   Number of children: Not on file   Years of education: Not on file   Highest education level: Not on file  Occupational History   Not on file  Tobacco Use   Smoking status: Every Day    Current packs/day: 0.50    Types: Cigarettes   Smokeless tobacco: Never  Vaping Use   Vaping status: Never Used  Substance and Sexual Activity   Alcohol use: Not Currently    Comment: 1 cocktail 3 weeks ago   Drug use: Not Currently    Types: Cocaine    Comment: states "it's legal though"   Sexual activity: Never  Other Topics Concern   Not on file  Social History Narrative   Not on file   Social Determinants  of Health   Financial Resource Strain: Not on file  Food Insecurity: Food Insecurity Present (08/22/2022)   Hunger Vital Sign    Worried About Running Out of Food in the Last Year: Sometimes true    Ran Out of Food in the Last Year: Sometimes true  Transportation Needs: Patient Unable To Answer (08/22/2022)   PRAPARE - Transportation    Lack of Transportation (Medical): Patient unable to answer    Lack of Transportation (Non-Medical): Patient unable to answer  Recent Concern: Transportation Needs - Unmet Transportation Needs (06/13/2022)   Received from [facility], [facility]   [facility] - Transportation    Lack of Transportation (Medical): Not on file    Lack of Transportation (Non-Medical): Yes  Physical Activity: Not on file  Stress: Not on file  Social Connections: Unknown (08/06/2021)   Received from [facility], [facility]   Social Network    Social Network: Not on file   Additional Social History:                         Sleep: Good  Appetite:  Good  Current Medications: Current Facility-Administered Medications  Medication Dose Route Frequency Provider Last Rate Last Admin   acetaminophen (TYLENOL) tablet 650 mg  650 mg Oral Q6H PRN [name], DO   650 mg at 10/26/22 1601   alum & mag hydroxide-simeth (MAALOX/MYLANTA) 200-200-20 MG/5ML suspension 30 mL  30 mL Oral Q4H PRN [name], DO   30 mL at 09/20/22 1142   diphenhydrAMINE (BENADRYL) capsule 50 mg  50 mg Oral Q6H PRN [name], DO   50 mg at 09/29/22 2254   Or   diphenhydrAMINE (BENADRYL) injection 50 mg  50 mg Intramuscular Q6H PRN [name], DO       haloperidol (HALDOL) tablet 5 mg  5 mg Oral Q6H PRN [name], DO   5 mg at 09/28/22 2149   Or   haloperidol lactate (HALDOL) injection 5 mg  5 mg Intramuscular Q6H PRN [name], DO       ibuprofen (ADVIL) tablet 600 mg  600 mg Oral Q6H PRN [name], [name], MD   600  mg at 10/26/22 2127   LORazepam (ATIVAN) tablet 1 mg  1 mg Oral TID PRN [name], DO   1 mg at 09/27/22 0025   magnesium hydroxide (MILK OF MAGNESIA) suspension 30 mL  30 mL Oral Daily PRN [name], DO       [START ON 11/13/2022] paliperidone (INVEGA SUSTENNA) injection 156 mg  156 mg Intramuscular Once [name], MD       paliperidone (INVEGA) 24 hr tablet 3 mg  3 mg Oral QHS [name], DO   3 mg at 10/26/22 2127   traZODone (DESYREL) tablet 50 mg  50 mg Oral QHS PRN [name], DO   50 mg at 10/26/22 2127    Lab Results: No results found for this or any previous visit (from the past 48 hour(s)).  Blood Alcohol level:  Lab Results  Component Value Date   ETH <10 08/17/2022   ETH <10 01/11/2021    Metabolic Disorder Labs: No results found for: "HGBA1C", "MPG" No results found for: "PROLACTIN" No results found for: "CHOL", "TRIG", "HDL", "CHOLHDL", "VLDL", "LDLCALC"  Physical Findings: AIMS:  , ,  ,  ,    CIWA:    COWS:     Musculoskeletal: Strength & Muscle Tone: within normal limits Gait & Station: normal Patient leans: N/A  Psychiatric Specialty Exam:  Presentation  General Appearance:  Casual; Neat  Eye Contact: Fair  Speech: Slow; Slurred  Speech Volume: Decreased  Handedness: Right   Mood and Affect  Mood: Anxious  Affect: Inappropriate   Thought Process  Thought Processes: Disorganized  Descriptions of Associations:Loose  Orientation:Partial  Thought Content:Illogical; Rumination  History of Schizophrenia/Schizoaffective disorder:No data recorded Duration of Psychotic Symptoms:No data recorded Hallucinations:No data recorded Ideas of Reference:Paranoia  Suicidal Thoughts:No data recorded Homicidal Thoughts:No data recorded  Sensorium  Memory: Immediate Fair; Remote Poor  Judgment: Poor  Insight: Poor   Executive Functions  Concentration: Poor  Attention  Span: Fair  Recall: [employer] of Knowledge: Fair  Language: Fair   Psychomotor Activity  Psychomotor Activity:No data recorded  Assets  Assets: Communication Skills   Sleep  Sleep:No data recorded    Blood pressure 123/77, pulse 73, temperature (!) 97.5 F (36.4 C), resp. rate 17, height 5\' 6"  (1.676 m), weight 64 kg, SpO2 100%. Body mass index is 22.76 kg/m.   Treatment Plan Summary: Daily contact with patient to assess and evaluate symptoms and progress in treatment, Medication management, and Plan continue current medications.  [name], DO 10/27/2022, 11:22  AM

## 2022-10-28 DIAGNOSIS — F250 Schizoaffective disorder, bipolar type: Secondary | ICD-10-CM | POA: Diagnosis not present

## 2022-10-28 NOTE — Group Note (Signed)
  Date:  10/28/2022 Time:  10:54 PM  Group Topic/Focus:  Building Self Esteem:   The Focus of this group is helping patients become aware of the effects of self-esteem on their lives, the things they and others do that enhance or undermine their self-esteem, seeing the relationship between their level of self-esteem and the choices they make and learning ways to enhance self-esteem. Coping With Mental Health Crisis:   The purpose of this group is to help patients identify strategies for coping with mental health crisis.  Group discusses possible causes of crisis and ways to manage them effectively. Conflict Resolution:   The focus of this group is to discuss the conflict resolution process and how it may be used upon discharge. Crisis Planning:   The purpose of this group is to help patients create a crisis plan for use upon discharge or in the future, as needed. Developing a Wellness Toolbox:   The focus of this group is to help patients develop a "wellness toolbox" with skills and strategies to promote recovery upon discharge. Early Warning Signs:   The focus of this group is to help patients identify signs or symptoms they exhibit before slipping into an unhealthy state or crisis. Emotional Education:   The focus of this group is to discuss what feelings/emotions are, and how they are experienced. Goals Group:   The focus of this group is to help patients establish daily goals to achieve during treatment and discuss how the patient can incorporate goal setting into their daily lives to aide in recovery. Healthy Communication:   The focus of this group is to discuss communication, barriers to communication, as well as healthy ways to communicate with others. Identifying Needs:   The focus of this group is to help patients identify their personal needs that have been historically problematic and identify healthy behaviors to address their needs. Making Healthy Choices:   The focus of this group is to  help patients identify negative/unhealthy choices they were using prior to admission and identify positive/healthier coping strategies to replace them upon discharge. Overcoming Stress:   The focus of this group is to define stress and help patients assess their triggers. Personal Choices and Values:   The focus of this group is to help patients assess and explore the importance of values in their lives, how their values affect their decisions, how they express their values and what opposes their expression. Self Care:   The focus of this group is to help patients understand the importance of self-care in order to improve or restore emotional, physical, spiritual, interpersonal, and financial health.    Participation Level:  Minimal  Participation Quality:  Attentive  Affect:  Appropriate  Cognitive:  Oriented  Insight: Improving  Engagement in Group:  Improving  Modes of Intervention:  Discussion and Support  Additional Comments:    Betty Henderson 10/28/2022, 10:54 PM

## 2022-10-28 NOTE — Progress Notes (Signed)
[facility] MD Progress Note  10/28/2022 2:03 PM Betty Henderson  MRN:  [id number] Subjective: Betty Henderson is seen on rounds.  She has been pleasant and cooperative on the unit.  Social work is awaiting to hear from DSS on placement.  She received her [facility] at the end of July.  She is still taking 3 mg at bedtime.  No side effects.  No problems.  Nurses report no issues and she has been pleasant and cooperative Principal Problem: Schizoaffective disorder, bipolar type (HCC) Diagnosis: Principal Problem:   Schizoaffective disorder, bipolar type (HCC)  Total Time spent with patient: 15 minutes  Past Psychiatric History: Schizoaffective disorder, bipolar type.  Past Medical History:  Past Medical History:  Diagnosis Date   Anemia    Arthritis    Chronic pain    Drug-seeking behavior    Malingering    Osteopetrosis    Psychosis (HCC)    Schizoaffective disorder, bipolar type (HCC)     Past Surgical History:  Procedure Laterality Date   MOUTH SURGERY     TUBAL LIGATION     Family History:  Family History  Family history unknown: Yes   Family Psychiatric  History: Unremarkable Social History:  Social History   Substance and Sexual Activity  Alcohol Use Not Currently   Comment: 1 cocktail 3 weeks ago     Social History   Substance and Sexual Activity  Drug Use Not Currently   Types: Cocaine   Comment: states "it's legal though"    Social History   Socioeconomic History   Marital status: Single    Spouse name: Not on file   Number of children: Not on file   Years of education: Not on file   Highest education level: Not on file  Occupational History   Not on file  Tobacco Use   Smoking status: Every Day    Current packs/day: 0.50    Types: Cigarettes   Smokeless tobacco: Never  Vaping Use   Vaping status: Never Used  Substance and Sexual Activity   Alcohol use: Not Currently    Comment: 1 cocktail 3 weeks ago   Drug use: Not Currently    Types: Cocaine     Comment: states "it's legal though"   Sexual activity: Never  Other Topics Concern   Not on file  Social History Narrative   Not on file   Social Determinants of Health   Financial Resource Strain: Not on file  Food Insecurity: Food Insecurity Present (08/22/2022)   Hunger Vital Sign    Worried About Running Out of Food in the Last Year: Sometimes true    Ran Out of Food in the Last Year: Sometimes true  Transportation Needs: Patient Unable To Answer (08/22/2022)   PRAPARE - Transportation    Lack of Transportation (Medical): Patient unable to answer    Lack of Transportation (Non-Medical): Patient unable to answer  Recent Concern: Transportation Needs - Unmet Transportation Needs (06/13/2022)   Received from [facility]   [facility] - Transportation    Lack of Transportation (Medical): Not on file    Lack of Transportation (Non-Medical): Yes  Physical Activity: Not on file  Stress: Not on file  Social Connections: Unknown (08/06/2021)   Received from [facility], [facility]   Social Network    Social Network: Not on file   Additional Social History:  Sleep: Good  Appetite:  Good  Current Medications: Current Facility-Administered Medications  Medication Dose Route Frequency Provider Last Rate Last Admin   acetaminophen (TYLENOL) tablet 650 mg  650 mg Oral Q6H PRN [name], DO   650 mg at 10/26/22 1601   alum & mag hydroxide-simeth (MAALOX/MYLANTA) 200-200-20 MG/5ML suspension 30 mL  30 mL Oral Q4H PRN [name], DO   30 mL at 09/20/22 1142   diphenhydrAMINE (BENADRYL) capsule 50 mg  50 mg Oral Q6H PRN [name], DO   50 mg at 09/29/22 2254   Or   diphenhydrAMINE (BENADRYL) injection 50 mg  50 mg Intramuscular Q6H PRN [name], DO       haloperidol (HALDOL) tablet 5 mg  5 mg Oral Q6H PRN [name], DO   5 mg at 09/28/22 2149   Or   haloperidol lactate  (HALDOL) injection 5 mg  5 mg Intramuscular Q6H PRN [name], DO       ibuprofen (ADVIL) tablet 600 mg  600 mg Oral Q6H PRN [name], [name], MD   600 mg at 10/28/22 9629   LORazepam (ATIVAN) tablet 1 mg  1 mg Oral TID PRN [name], DO   1 mg at 09/27/22 0025   magnesium hydroxide (MILK OF MAGNESIA) suspension 30 mL  30 mL Oral Daily PRN [name], DO       [START ON 11/13/2022] paliperidone (INVEGA SUSTENNA) injection 156 mg  156 mg Intramuscular Once [name], MD       paliperidone (INVEGA) 24 hr tablet 3 mg  3 mg Oral QHS [name], DO   3 mg at 10/27/22 2146   traZODone (DESYREL) tablet 50 mg  50 mg Oral QHS PRN [name], DO   50 mg at 10/27/22 2146    Lab Results: No results found for this or any previous visit (from the past 48 hour(s)).  Blood Alcohol level:  Lab Results  Component Value Date   ETH <10 08/17/2022   ETH <10 01/11/2021    Metabolic Disorder Labs: No results found for: "HGBA1C", "MPG" No results found for: "PROLACTIN" No results found for: "CHOL", "TRIG", "HDL", "CHOLHDL", "VLDL", "LDLCALC"  Physical Findings: AIMS:  , ,  ,  ,    CIWA:    COWS:     Musculoskeletal: Strength & Muscle Tone: within normal limits Gait & Station: normal Patient leans: N/A  Psychiatric Specialty Exam:  Presentation  General Appearance:  Casual; Neat  Eye Contact: Fair  Speech: Slow; Slurred  Speech Volume: Decreased  Handedness: Right   Mood and Affect  Mood: Anxious  Affect: Inappropriate   Thought Process  Thought Processes: Disorganized  Descriptions of Associations:Loose  Orientation:Partial  Thought Content:Illogical; Rumination  History of Schizophrenia/Schizoaffective disorder:No data recorded Duration of Psychotic Symptoms:No data recorded Hallucinations:No data recorded Ideas of Reference:Paranoia  Suicidal Thoughts:No data recorded Homicidal Thoughts:No  data recorded  Sensorium  Memory: Immediate Fair; Remote Poor  Judgment: Poor  Insight: Poor   Executive Functions  Concentration: Poor  Attention Span: Fair  Recall: [employer] of Knowledge: Fair  Language: Fair   Psychomotor Activity  Psychomotor Activity:No data recorded  Assets  Assets: Communication Skills   Sleep  Sleep:No data recorded    Blood pressure 128/75, pulse 79, temperature (!) 97.2 F (36.2 C), resp. rate 16, height 5\' 6"  (1.676 m), weight 64 kg, SpO2 100%. Body mass index is 22.76 kg/m.  Treatment Plan Summary: Daily contact with patient to assess and evaluate symptoms and progress in treatment, Medication management, and Plan continue current medications.  [name], DO 10/28/2022, 2:03 PM

## 2022-10-28 NOTE — Progress Notes (Signed)
Patient was admitted involuntarily on Aug 22, 2022 for worsening psychosis with a dx of schizoaffective disorder, bipolar type. She is currently a voluntary admission. Adherent to taking Invega medication at bedtime. Invega  adm whole without difficulty. Attended group. Patient complained of R thigh pain on previous shift but denied pain on this shift.She denies SI/HI/AVH.  No PRN's adm. Discharge planning ongoing.  Q15 minute unit checks in place.

## 2022-10-28 NOTE — Group Note (Signed)
Date:  10/28/2022 Time:  3:20 PM  Group Topic/Focus:  Coping With Mental Health Crisis:   The purpose of this group is to help patients identify strategies for coping with mental health crisis.  Group discusses possible causes of crisis and ways to manage them effectively. Healthy Communication:   The focus of this group is to discuss communication, barriers to communication, as well as healthy ways to communicate with others.  We were able to go outside to get some fresh air and we listened to music as well!    Participation Level:  Active  Participation Quality:  Appropriate  Affect:  Appropriate  Cognitive:  Appropriate  Insight: Appropriate  Engagement in Group:  Engaged  Modes of Intervention:  Discussion  Additional Comments:     Jakerria Kingbird L Kaja Jackowski 10/28/2022, 3:20 PM

## 2022-10-28 NOTE — Group Note (Signed)
Recreation Therapy Group Note   Group Topic:Other  Group Date: 10/28/2022 Start Time: 1400 End Time: 1435 Facilitators: Rosina Lowenstein, LRT, CTRS Location: Courtyard   Group Description: Feelings Towards Discharge. Patients, LRT and NT all sat outside while listening to music and getting fresh air and sunlight. The group discussed how they feel about going home, their stay at the hospital, things they have learned, and the first thing they will do when they get home.  Goal Area(s) Addressed: Patient will acknowledge the benefits of sunlight and fresh air to mental health.  Patient will reflect on previous life events surrounding their hospital stay. Patients will communicate with peers and LRT.    Affect/Mood: Appropriate   Participation Level: Active and Engaged   Participation Quality: Independent   Behavior: Appropriate, Calm, and Cooperative   Speech/Thought Process: Coherent   Insight: Good   Judgement: Good   Modes of Intervention: Guided Discussion   Patient Response to Interventions:  Attentive and Receptive   Education Outcome:  Acknowledges education   Clinical Observations/Individualized Feedback: Betty Henderson was active in their participation of session activities and group discussion. Pt talked about her leg pain mostly. Pt was talking to herself briefly. Overall, pt was pleasant and interacted well with LRT and peers duration of session.   Plan: Continue to engage patient in RT group sessions 2-3x/week.   Rosina Lowenstein, LRT, CTRS 10/28/2022 3:02 PM

## 2022-10-28 NOTE — Plan of Care (Signed)
  Problem: Education: Goal: Knowledge of General Education information will improve Description: Including pain rating scale, medication(s)/side effects and non-pharmacologic comfort measures Outcome: Progressing   Problem: Health Behavior/Discharge Planning: Goal: Ability to manage health-related needs will improve Outcome: Progressing   Problem: Clinical Measurements: Goal: Ability to maintain clinical measurements within normal limits will improve Outcome: Progressing Goal: Diagnostic test results will improve Outcome: Progressing   Problem: Nutrition: Goal: Adequate nutrition will be maintained Outcome: Progressing   Problem: Coping: Goal: Level of anxiety will decrease Outcome: Progressing   Problem: Elimination: Goal: Will not experience complications related to bowel motility Outcome: Progressing Goal: Will not experience complications related to urinary retention Outcome: Progressing   Problem: Pain Managment: Goal: General experience of comfort will improve Outcome: Progressing   Problem: Safety: Goal: Ability to remain free from injury will improve Outcome: Progressing   Problem: Skin Integrity: Goal: Risk for impaired skin integrity will decrease Outcome: Progressing

## 2022-10-28 NOTE — BH IP Treatment Plan (Signed)
Interdisciplinary Treatment and Diagnostic Plan Update  10/28/2022 Time of Session: 9:00 AM  Betty Henderson MRN: [id number]  Principal Diagnosis: Schizoaffective disorder, bipolar type (HCC)  Secondary Diagnoses: Principal Problem:   Schizoaffective disorder, bipolar type (HCC)   Current Medications:  Current Facility-Administered Medications  Medication Dose Route Frequency Provider Last Rate Last Admin   acetaminophen (TYLENOL) tablet 650 mg  650 mg Oral Q6H PRN [name], DO   650 mg at 10/26/22 1601   alum & mag hydroxide-simeth (MAALOX/MYLANTA) 200-200-20 MG/5ML suspension 30 mL  30 mL Oral Q4H PRN [name], DO   30 mL at 09/20/22 1142   diphenhydrAMINE (BENADRYL) capsule 50 mg  50 mg Oral Q6H PRN [name], DO   50 mg at 09/29/22 2254   Or   diphenhydrAMINE (BENADRYL) injection 50 mg  50 mg Intramuscular Q6H PRN [name], DO       haloperidol (HALDOL) tablet 5 mg  5 mg Oral Q6H PRN [name], DO   5 mg at 09/28/22 2149   Or   haloperidol lactate (HALDOL) injection 5 mg  5 mg Intramuscular Q6H PRN [name], DO       ibuprofen (ADVIL) tablet 600 mg  600 mg Oral Q6H PRN [name], [name], MD   600 mg at 10/28/22 7829   LORazepam (ATIVAN) tablet 1 mg  1 mg Oral TID PRN [name], DO   1 mg at 09/27/22 0025   magnesium hydroxide (MILK OF MAGNESIA) suspension 30 mL  30 mL Oral Daily PRN [name], DO       [START ON 11/13/2022] paliperidone (INVEGA SUSTENNA) injection 156 mg  156 mg Intramuscular Once [name], MD       paliperidone (INVEGA) 24 hr tablet 3 mg  3 mg Oral QHS [name], DO   3 mg at 10/27/22 2146   traZODone (DESYREL) tablet 50 mg  50 mg Oral QHS PRN [name], DO   50 mg at 10/27/22 2146   PTA Medications: Medications Prior to Admission  Medication Sig Dispense Refill Last Dose   ondansetron (ZOFRAN) 4 MG tablet Take 1  tablet (4 mg total) by mouth every 8 (eight) hours as needed for nausea or vomiting. (Patient not taking: Reported on 08/17/2022) 20 tablet 0     Patient Stressors: Financial difficulties   Health problems   Medication change or noncompliance    Patient Strengths: Ability for insight   Treatment Modalities: Medication Management, Group therapy, Case management,  1 to 1 session with clinician, Psychoeducation, Recreational therapy.   Physician Treatment Plan for Primary Diagnosis: Schizoaffective disorder, bipolar type (HCC) Long Term Goal(s): Improvement in symptoms so as ready for discharge   Short Term Goals: Ability to identify changes in lifestyle to reduce recurrence of condition will improve Ability to verbalize feelings will improve Ability to disclose and discuss suicidal ideas Ability to demonstrate self-control will improve Ability to identify and develop effective coping behaviors will improve Ability to maintain clinical measurements within normal limits will improve Compliance with prescribed medications will improve Ability to identify triggers associated with substance abuse/mental health issues will improve  Medication Management: Evaluate patient's response, side effects, and tolerance of medication regimen.  Therapeutic Interventions: 1 to 1 sessions, Unit Group sessions and Medication administration.  Evaluation of Outcomes: Progressing  Physician Treatment Plan for Secondary Diagnosis: Principal Problem:   Schizoaffective disorder, bipolar type (HCC)  Long Term Goal(s): Improvement in symptoms  so as ready for discharge   Short Term Goals: Ability to identify changes in lifestyle to reduce recurrence of condition will improve Ability to verbalize feelings will improve Ability to disclose and discuss suicidal ideas Ability to demonstrate self-control will improve Ability to identify and develop effective coping behaviors will improve Ability to maintain  clinical measurements within normal limits will improve Compliance with prescribed medications will improve Ability to identify triggers associated with substance abuse/mental health issues will improve     Medication Management: Evaluate patient's response, side effects, and tolerance of medication regimen.  Therapeutic Interventions: 1 to 1 sessions, Unit Group sessions and Medication administration.  Evaluation of Outcomes: Progressing   RN Treatment Plan for Primary Diagnosis: Schizoaffective disorder, bipolar type (HCC) Long Term Goal(s): Knowledge of disease and therapeutic regimen to maintain health will improve  Short Term Goals: Ability to remain free from injury will improve, Ability to verbalize frustration and anger appropriately will improve, Ability to demonstrate self-control, Ability to participate in decision making will improve, Ability to verbalize feelings will improve, Ability to disclose and discuss suicidal ideas, Ability to identify and develop effective coping behaviors will improve, and Compliance with prescribed medications will improve  Medication Management: RN will administer medications as ordered by provider, will assess and evaluate patient's response and provide education to patient for prescribed medication. RN will report any adverse and/or side effects to prescribing provider.  Therapeutic Interventions: 1 on 1 counseling sessions, Psychoeducation, Medication administration, Evaluate responses to treatment, Monitor vital signs and CBGs as ordered, Perform/monitor CIWA, COWS, AIMS and Fall Risk screenings as ordered, Perform wound care treatments as ordered.  Evaluation of Outcomes: Progressing   LCSW Treatment Plan for Primary Diagnosis: Schizoaffective disorder, bipolar type (HCC) Long Term Goal(s): Safe transition to appropriate next level of care at discharge, Engage patient in therapeutic group addressing interpersonal concerns.  Short Term Goals:  Engage patient in aftercare planning with referrals and resources, Increase social support, Increase ability to appropriately verbalize feelings, Increase emotional regulation, Facilitate acceptance of mental health diagnosis and concerns, and Increase skills for wellness and recovery  Therapeutic Interventions: Assess for all discharge needs, 1 to 1 time with Social worker, Explore available resources and support systems, Assess for adequacy in community support network, Educate family and significant other(s) on suicide prevention, Complete Psychosocial Assessment, Interpersonal group therapy.  Evaluation of Outcomes: Progressing   Progress in Treatment: Attending groups: Yes. Participating in groups: Yes. Taking medication as prescribed: Yes. Toleration medication: Yes. Family/Significant other contact made: Yes, individual(s) contacted:  Lynzie Sark, son, [phone] Patient understands diagnosis: Yes. Discussing patient identified problems/goals with staff: Yes. Medical problems stabilized or resolved: Yes. Denies suicidal/homicidal ideation: Yes. Issues/concerns per patient self-inventory: Yes. Other: No   New Short Term/Long Term Goal(s): Update 6/30: none at this time. Update 09/27/2022:  No changes at this time.  Update 10/02/2022:  No changes at this time. Update 10/07/22: No changes at this time 10/12/22: No changes at this time Update 10/18/22: No changes at this time Update 10/23/22: No changes at this time Update 10/28/22: No changes at this time      Patient Goals:  Update 6/30: none at this time. Update 09/27/2022:  No changes at this time. Update 10/02/2022:  No changes at this time. Update 10/07/22: No changes at this time 10/12/22: No changes at this time Update 10/18/22: No changes at this time Update 10/23/22: No changes at this time Update 10/28/22: No changes at this time  Discharge Plan or Barriers: Update 6/30: APS report has been made and patient is being investigated  for guardianship needs.  Remains homeless with limited supports. Update 09/27/2022:  No changes at this time.  Update 10/02/2022:  Patient remains safe on the unit at this time.  Patient remains psychotic at this time.  APS report has been made, however, no follow up from the caseworker on her case.  No safe discharge identified.  CSW has requested that application for Medicaid and disability be completed.   Update 10/07/22: No changes at this time 10/12/22: No changes at this time Update 10/18/22: No changes at this time Update 10/23/22: No changes at this time Update 10/23/22: No changes at this time Update 10/28/22: According to [facility], pt's caseworker at DSS, the petition was filed for guardianship on 10/21/22, now awaiting for the petition to be approved     Reason for Continuation of Hospitalization: Hallucinations Mania Medication stabilization   Estimated Length of Stay:  Update 6/30: 1-7 days Update 09/27/2022:  TBD Update 10/02/2022:  No changes at this time. UpdaUpdate 10/18/22: No changes at this time te 10/07/22: No changes at this time Update 10/18/22: No changes at this time Update 10/23/22: No changes at this time Update 10/28/22: No changes at this time    Last 3 [place] Suicide Severity Risk Score: Flowsheet Row Admission (Current) from 08/22/2022 in [facility] ED from 08/17/2022 in [facility] Emergency Department at [facility] ED from 08/12/2022 in [facility] Department at [facility]  C-SSRS RISK CATEGORY Low Risk No Risk No Risk       Last [facility] 2/9 Scores:     No data to display          Scribe for Treatment Team: Elza Rafter, Theresia Majors 10/28/2022 4:09 PM

## 2022-10-28 NOTE — Plan of Care (Signed)
  Problem: Coping: Goal: Level of anxiety will decrease Outcome: Progressing   Problem: Elimination: Goal: Will not experience complications related to bowel motility Outcome: Progressing Goal: Will not experience complications related to urinary retention Outcome: Progressing   

## 2022-10-28 NOTE — BHH Counselor (Incomplete)
CSW contacted Rodena Medin requesting information regarding

## 2022-10-28 NOTE — Plan of Care (Signed)
D: Pt alert and oriented. Pt denies experiencing any depression and anxiety at this time. Pt reports experiencing 10/10 right thigh pain as being achy at this time. Prn medication given. Pt rated pain 0/10 when reassessed. Pt denies experiencing any SI/HI, or AVH at this time.   A: Support and encouragement provided. Frequent verbal contact made. Routine safety checks conducted q15 minutes.   R: No adverse drug reactions noted. Pt verbally contracts for safety at this time. Pt compliant with treatment plan. Pt interacts well but minimally with others on the unit. Pt remains safe at this time. Plan of care ongoing.  Problem: Coping: Goal: Level of anxiety will decrease Outcome: Progressing   Problem: Pain Managment: Goal: General experience of comfort will improve Outcome: Progressing

## 2022-10-29 DIAGNOSIS — F250 Schizoaffective disorder, bipolar type: Secondary | ICD-10-CM | POA: Diagnosis not present

## 2022-10-29 NOTE — Progress Notes (Signed)
[facility] MD Progress Note  10/29/2022 11:54 AM Betty Henderson  MRN:  [id number]  Subjective: Case discussed with RN and social worker, chart reviewed, patient seen today during rounds. Per staff report patient has been irritated this morning.  During assessment patient was agitated.  She mentioned that she used to call police to get walker.  Patient reassured and informed that she has not walker in the past few months.  Patient was adamant to get a walker.  She was easily redirected.  Patient is occasionally observed talking to self in her room although she continues to denies auditory hallucinations.  Patient denies any thoughts of harming herself or others.  Social work is awaiting to hear from DSS on placement.   Principal Problem: Schizoaffective disorder, bipolar type (HCC) Diagnosis: Principal Problem:   Schizoaffective disorder, bipolar type (HCC)   Past Psychiatric History: Schizoaffective disorder, bipolar type.  Past Medical History:  Past Medical History:  Diagnosis Date   Anemia    Arthritis    Chronic pain    Drug-seeking behavior    Malingering    Osteopetrosis    Psychosis (HCC)    Schizoaffective disorder, bipolar type (HCC)     Past Surgical History:  Procedure Laterality Date   MOUTH SURGERY     TUBAL LIGATION     Family History:  Family History  Family history unknown: Yes   Family Psychiatric  History: Unremarkable Social History:  Social History   Substance and Sexual Activity  Alcohol Use Not Currently   Comment: 1 cocktail 3 weeks ago     Social History   Substance and Sexual Activity  Drug Use Not Currently   Types: Cocaine   Comment: states "it's legal though"    Social History   Socioeconomic History   Marital status: Single    Spouse name: Not on file   Number of children: Not on file   Years of education: Not on file   Highest education level: Not on file  Occupational History   Not on file  Tobacco Use   Smoking status: Every Day     Current packs/day: 0.50    Types: Cigarettes   Smokeless tobacco: Never  Vaping Use   Vaping status: Never Used  Substance and Sexual Activity   Alcohol use: Not Currently    Comment: 1 cocktail 3 weeks ago   Drug use: Not Currently    Types: Cocaine    Comment: states "it's legal though"   Sexual activity: Never  Other Topics Concern   Not on file  Social History Narrative   Not on file   Social Determinants of Health   Financial Resource Strain: Not on file  Food Insecurity: Food Insecurity Present (08/22/2022)   Hunger Vital Sign    Worried About Running Out of Food in the Last Year: Sometimes true    Ran Out of Food in the Last Year: Sometimes true  Transportation Needs: Patient Unable To Answer (08/22/2022)   PRAPARE - Transportation    Lack of Transportation (Medical): Patient unable to answer    Lack of Transportation (Non-Medical): Patient unable to answer  Recent Concern: Transportation Needs - Unmet Transportation Needs (06/13/2022)   Received from [facility], [facility]   [facility] - Transportation    Lack of Transportation (Medical): Not on file    Lack of Transportation (Non-Medical): Yes  Physical Activity: Not on file  Stress: Not on file  Social Connections: Unknown (08/06/2021)   [facility]   Social Network    Social Network: Not on file   Additional Social History:                         Sleep: Good  Appetite:  Good  Current Medications: Current Facility-Administered Medications  Medication Dose Route Frequency Provider Last Rate Last Admin   acetaminophen (TYLENOL) tablet 650 mg  650 mg Oral Q6H PRN [name], DO   650 mg at 10/26/22 1601   alum & mag hydroxide-simeth (MAALOX/MYLANTA) 200-200-20 MG/5ML suspension 30 mL  30 mL Oral Q4H PRN [name], DO   30 mL at 09/20/22 1142   diphenhydrAMINE (BENADRYL) capsule 50 mg  50 mg Oral Q6H PRN [name], DO   50  mg at 09/29/22 2254   Or   diphenhydrAMINE (BENADRYL) injection 50 mg  50 mg Intramuscular Q6H PRN [name], DO       haloperidol (HALDOL) tablet 5 mg  5 mg Oral Q6H PRN [name], DO   5 mg at 09/28/22 2149   Or   haloperidol lactate (HALDOL) injection 5 mg  5 mg Intramuscular Q6H PRN [name], DO       ibuprofen (ADVIL) tablet 600 mg  600 mg Oral Q6H PRN [name], [name], MD   600 mg at 10/28/22 1610   LORazepam (ATIVAN) tablet 1 mg  1 mg Oral TID PRN [name], DO   1 mg at 09/27/22 0025   magnesium hydroxide (MILK OF MAGNESIA) suspension 30 mL  30 mL Oral Daily PRN [name], DO       [START ON 11/13/2022] paliperidone (INVEGA SUSTENNA) injection 156 mg  156 mg Intramuscular Once [name], MD       paliperidone (INVEGA) 24 hr tablet 3 mg  3 mg Oral QHS [name], DO   3 mg at 10/28/22 2106   traZODone (DESYREL) tablet 50 mg  50 mg Oral QHS PRN [name], DO   50 mg at 10/27/22 2146    Lab Results: No results found for this or any previous visit (from the past 48 hour(s)).  Blood Alcohol level:  Lab Results  Component Value Date   ETH <10 08/17/2022   ETH <10 01/11/2021    Metabolic Disorder Labs: No results found for: "HGBA1C", "MPG" No results found for: "PROLACTIN" No results found for: "CHOL", "TRIG", "HDL", "CHOLHDL", "VLDL", "LDLCALC"  Physical Findings: AIMS:  , ,  ,  ,    CIWA:    COWS:     Musculoskeletal: Strength & Muscle Tone: within normal limits Gait & Station: normal Patient leans: N/A   Psychiatric Specialty Exam:   Presentation  General Appearance:  Casual; Neat   Eye Contact: Fair   Speech:  Garbled   Speech Volume: Normal   Handedness: Right     Mood and Affect  Mood: Goldfine"   Affect: Agitated    Thought Processes: Disorganized Probably at baseline. Improved since admission   Descriptions of Associations:Loose    Orientation:Partial   Thought Content:Denies SI/HI, delusional, at baseline   Hallucinations:Denies , but is observed talking to self Ideas of Reference:Paranoia   Suicidal Thoughts:Denies SI Homicidal Thoughts:Denies HI   Sensorium  Memory: Immediate Fair; Remote Poor   Judgment: Fair, pt is Rx compliant   Insight: Limited     Executive Functions  Concentration: Poor   Attention Span: Short   Language: Fair  Psychomotor Activity  Psychomotor Activity:Variable, at times decreased, and at times increased   Assets  Assets: Communication Skills     Sleep  Sleep:Improved     Physical Exam: Physical Exam Vitals and nursing note reviewed.  Constitutional:      Appearance: Normal appearance. She is normal weight.  Neurological:     General: No focal deficit present.     Mental Status: She is alert.      Review of Systems  Constitutional: Negative.   HENT: Negative.    Eyes: Negative.   Respiratory: Negative.    Cardiovascular: Negative.   Gastrointestinal: Negative.   Genitourinary: Negative.   Musculoskeletal: Negative.   Skin: Negative.   Neurological: Negative.   Endo/Heme/Allergies: Negative.     Blood pressure 117/71, pulse 66, temperature 97.7 F (36.5 C), resp. rate 20, height 5\' 6"  (1.676 m), weight 64 kg, SpO2 100%. Body mass index is 22.76 kg/m.   Treatment Plan Summary: Daily contact with patient to assess and evaluate symptoms and progress in treatment, Medication management, and Plan continue current medications.  Invega Sustenna 156 mg IM given, 10/15/22 to be repeated every 4 weeks. Continue on Invega 3 mg Po daily    [name], MD

## 2022-10-29 NOTE — Group Note (Signed)
Date:  10/29/2022 Time:  8:38 PM  Group Topic/Focus:  Self Care:   The focus of this group is to help patients understand the importance of self-care in order to improve or restore emotional, physical, spiritual, interpersonal, and financial health.    Participation Level:  Active  Participation Quality:  Appropriate  Affect:  Appropriate  Cognitive:  Oriented  Insight: Good  Engagement in Group:  Engaged  Modes of Intervention:  Discussion  Additional Comments:  Adult unit workbook pg.54-56 on self-care  Mandy Fitzwater D Adalind Weitz 10/29/2022, 8:38 PM

## 2022-10-29 NOTE — Plan of Care (Signed)
  Problem: Elimination: Goal: Will not experience complications related to bowel motility Outcome: Progressing Goal: Will not experience complications related to urinary retention Outcome: Progressing   Problem: Pain Managment: Goal: General experience of comfort will improve Outcome: Progressing   

## 2022-10-29 NOTE — BHH Counselor (Signed)
CSW contacted Betty Henderson at [phone] to discuss the status of the petition which was filed.   CSW unable to reach, LVM requesting return call  Betty Henderson, MSW, [facility] 10/29/2022 8:40 AM

## 2022-10-29 NOTE — Progress Notes (Signed)
Patient was admitted involuntarily on Aug 22, 2022 for worsening psychosis with a dx of schizoaffective disorder, bipolar type. She is no longer an involuntary admission. She denies SI/HI/AVH. Patient attended group and had snack but quick retreated to her room after. Medication adm in bedroom with no issues.Denies pain.   Patient noted to be having long conversations with herself while tossing and turning in bed. Trazodone 50 mg offered and adm at 2255. Upon follow up, patient was found to be resting comfortably.  Discharge planning ongoing. Q15 minute unit checks in place.

## 2022-10-29 NOTE — BHH Counselor (Signed)
CSW contacted [facility] [phone] to discuss bed availability.   They state currently they are in a COVID-19 outbreak and cannot do admissions. States that CSW can send some information to [phone]. Boyd Kerbs states they would need pt's H&P, financial information, Medicaid vs. Private Pay, [place]2, who would be guardian etc.  Reynaldo Minium, MSW, [facility] 10/29/2022 3:58 PM

## 2022-10-29 NOTE — Group Note (Signed)
Date:  10/29/2022 Time:  2:55 PM  Group Topic/Focus:  Coping With Mental Health Crisis:   The purpose of this group is to help patients identify strategies for coping with mental health crisis.  Group discusses possible causes of crisis and ways to manage them effectively. Goals Group:   The focus of this group is to help patients establish daily goals to achieve during treatment and discuss how the patient can incorporate goal setting into their daily lives to aide in recovery.    Participation Level:  Active  Participation Quality:  Appropriate  Affect:  Appropriate  Cognitive:  Appropriate  Insight: Appropriate  Engagement in Group:  Engaged  Modes of Intervention:  Discussion  Additional Comments:   Ardelle Anton 10/29/2022, 2:55 PM

## 2022-10-29 NOTE — Plan of Care (Signed)
  Problem: Education: Goal: Knowledge of General Education information will improve Description: Including pain rating scale, medication(s)/side effects and non-pharmacologic comfort measures Outcome: Not Progressing   Problem: Health Behavior/Discharge Planning: Goal: Ability to manage health-related needs will improve Outcome: Not Progressing   Problem: Clinical Measurements: Goal: Ability to maintain clinical measurements within normal limits will improve Outcome: Not Progressing Goal: Diagnostic test results will improve Outcome: Not Progressing   Problem: Nutrition: Goal: Adequate nutrition will be maintained Outcome: Not Progressing   Problem: Coping: Goal: Level of anxiety will decrease Outcome: Not Progressing   Problem: Elimination: Goal: Will not experience complications related to bowel motility Outcome: Not Progressing Goal: Will not experience complications related to urinary retention Outcome: Not Progressing

## 2022-10-29 NOTE — Progress Notes (Signed)
   10/29/22 0735  Psych Admission Type (Psych Patients Only)  Admission Status Voluntary  Psychosocial Assessment  Patient Complaints None  Eye Contact Glaring  Facial Expression Pained  Affect Angry  Speech Logical/coherent  Interaction Assertive;Isolative  Motor Activity Shuffling  Appearance/Hygiene Layered clothes  Behavior Characteristics Appropriate to situation  Mood Labile;Angry  Thought Process  Coherency Tangential;Loose associations;Flight of ideas;Disorganized  Content Blaming others;Preoccupation  Delusions None reported or observed  Perception Hallucinations  Hallucination Auditory;Visual  Judgment Impaired  Confusion Mild  Danger to Self  Current suicidal ideation? Denies  Danger to Others  Danger to Others None reported or observed  Danger to Others Abnormal  Harmful Behavior to others No threats or harm toward other people

## 2022-10-29 NOTE — BHH Group Notes (Signed)
RT group was not held due to MHT group being in progress in the dayroom. LRT checked in with patients individually afterwards.   Kaide Gage LRT, [address]

## 2022-10-29 NOTE — BHH Counselor (Signed)
CSW contacted Oneal Grout [facility] at [phone]. Unable to reach, unable to LVM   Reynaldo Minium, MSW, [facility] 10/29/2022 3:49 PM

## 2022-10-30 DIAGNOSIS — F250 Schizoaffective disorder, bipolar type: Secondary | ICD-10-CM | POA: Diagnosis not present

## 2022-10-30 NOTE — Progress Notes (Signed)
[facility] MD Progress Note  10/30/2022 3:13 PM Betty Henderson  MRN:  [id number]  Subjective: Case discussed with RN and social worker, chart reviewed, patient seen today during rounds.  No new acute events overnight.  Patient is doing better today.  She is less agitated as compared to yesterday.  Patient denies auditory or visual hallucinations.  Patient denies any thoughts of harming herself or others.  Social work is awaiting to hear from DSS on placement.   Principal Problem: Schizoaffective disorder, bipolar type (HCC) Diagnosis: Principal Problem:   Schizoaffective disorder, bipolar type (HCC)   Past Psychiatric History: Schizoaffective disorder, bipolar type.  Past Medical History:  Past Medical History:  Diagnosis Date   Anemia    Arthritis    Chronic pain    Drug-seeking behavior    Malingering    Osteopetrosis    Psychosis (HCC)    Schizoaffective disorder, bipolar type (HCC)     Past Surgical History:  Procedure Laterality Date   MOUTH SURGERY     TUBAL LIGATION     Family History:  Family History  Family history unknown: Yes   Family Psychiatric  History: Unremarkable Social History:  Social History   Substance and Sexual Activity  Alcohol Use Not Currently   Comment: 1 cocktail 3 weeks ago     Social History   Substance and Sexual Activity  Drug Use Not Currently   Types: Cocaine   Comment: states "it's legal though"    Social History   Socioeconomic History   Marital status: Single    Spouse name: Not on file   Number of children: Not on file   Years of education: Not on file   Highest education level: Not on file  Occupational History   Not on file  Tobacco Use   Smoking status: Every Day    Current packs/day: 0.50    Types: Cigarettes   Smokeless tobacco: Never  Vaping Use   Vaping status: Never Used  Substance and Sexual Activity   Alcohol use: Not Currently    Comment: 1 cocktail 3 weeks ago   Drug use: Not Currently    Types:  Cocaine    Comment: states "it's legal though"   Sexual activity: Never  Other Topics Concern   Not on file  Social History Narrative   Not on file   Social Determinants of Health   Financial Resource Strain: Not on file  Food Insecurity: Food Insecurity Present (08/22/2022)   Hunger Vital Sign    Worried About Running Out of Food in the Last Year: Sometimes true    Ran Out of Food in the Last Year: Sometimes true  Transportation Needs: Patient Unable To Answer (08/22/2022)   PRAPARE - Transportation    Lack of Transportation (Medical): Patient unable to answer    Lack of Transportation (Non-Medical): Patient unable to answer  Recent Concern: Transportation Needs - Unmet Transportation Needs (06/13/2022)   Received from [facility], [facility]   [facility] - Transportation    Lack of Transportation (Medical): Not on file    Lack of Transportation (Non-Medical): Yes  Physical Activity: Not on file  Stress: Not on file  Social Connections: Unknown (08/06/2021)   Received from [facility], [facility]   Social Network    Social Network: Not on file   Additional Social History:                         Sleep: Good  Appetite:  Good  Current Medications: Current Facility-Administered Medications  Medication Dose Route Frequency Provider Last Rate Last Admin   acetaminophen (TYLENOL) tablet 650 mg  650 mg Oral Q6H PRN [name], DO   650 mg at 10/26/22 1601   alum & mag hydroxide-simeth (MAALOX/MYLANTA) 200-200-20 MG/5ML suspension 30 mL  30 mL Oral Q4H PRN [name], DO   30 mL at 09/20/22 1142   diphenhydrAMINE (BENADRYL) capsule 50 mg  50 mg Oral Q6H PRN [name], DO   50 mg at 09/29/22 2254   Or   diphenhydrAMINE (BENADRYL) injection 50 mg  50 mg Intramuscular Q6H PRN [name], DO       haloperidol (HALDOL) tablet 5 mg  5 mg Oral Q6H PRN [name], DO   5 mg at 09/28/22 2149   Or    haloperidol lactate (HALDOL) injection 5 mg  5 mg Intramuscular Q6H PRN [name], DO       ibuprofen (ADVIL) tablet 600 mg  600 mg Oral Q6H PRN [name], [name], MD   600 mg at 10/28/22 7829   LORazepam (ATIVAN) tablet 1 mg  1 mg Oral TID PRN [name], DO   1 mg at 09/27/22 0025   magnesium hydroxide (MILK OF MAGNESIA) suspension 30 mL  30 mL Oral Daily PRN [name], DO       [START ON 11/13/2022] paliperidone (INVEGA SUSTENNA) injection 156 mg  156 mg Intramuscular Once [name], MD       paliperidone (INVEGA) 24 hr tablet 3 mg  3 mg Oral QHS [name], DO   3 mg at 10/29/22 2117   traZODone (DESYREL) tablet 50 mg  50 mg Oral QHS PRN [name], DO   50 mg at 10/29/22 2255    Lab Results: No results found for this or any previous visit (from the past 48 hour(s)).  Blood Alcohol level:  Lab Results  Component Value Date   ETH <10 08/17/2022   ETH <10 01/11/2021    Metabolic Disorder Labs: No results found for: "HGBA1C", "MPG" No results found for: "PROLACTIN" No results found for: "CHOL", "TRIG", "HDL", "CHOLHDL", "VLDL", "LDLCALC"    Musculoskeletal: Strength & Muscle Tone: within normal limits Gait & Station: normal Patient leans: N/A   Psychiatric Specialty Exam:   Presentation  General Appearance:  Casual; Neat   Eye Contact: Fair   Speech:  Garbled   Speech Volume: Normal   Handedness: Right     Mood and Affect  Mood: Goldfine"   Affect: Agitated    Thought Processes: Disorganized Probably at baseline. Improved since admission   Descriptions of Associations:Loose   Orientation:Partial   Thought Content:Denies SI/HI, delusional, at baseline   Hallucinations:Denies ,   Ideas of Reference:Paranoia, at baseline   Suicidal Thoughts:Denies SI Homicidal Thoughts:Denies HI   Sensorium  Memory: Immediate Fair; Remote Poor   Judgment: Fair, pt is Rx compliant    Insight: Limited     Executive Functions  Concentration: Poor   Attention Span: Short   Language: Fair     Psychomotor Activity  Psychomotor Activity:Variable, at times decreased, and at times increased   Assets  Assets: Communication Skills     Sleep  Sleep:Improved     Physical Exam: Physical Exam Vitals and nursing note reviewed.  Constitutional:      Appearance: Normal appearance. She is normal weight.  Neurological:     General: No focal deficit  present.     Mental Status: She is alert.      Review of Systems  Constitutional: Negative.   HENT: Negative.    Eyes: Negative.   Respiratory: Negative.    Cardiovascular: Negative.   Gastrointestinal: Negative.   Genitourinary: Negative.   Musculoskeletal: Negative.   Skin: Negative.   Neurological: Negative.   Endo/Heme/Allergies: Negative.     Blood pressure 117/71, pulse 66, temperature 97.7 F (36.5 C), resp. rate 20, height 5\' 6"  (1.676 m), weight 64 kg, SpO2 100%. Body mass index is 22.76 kg/m.   Treatment Plan Summary: Daily contact with patient to assess and evaluate symptoms and progress in treatment, Medication management, and Plan continue current medications.  Invega Sustenna 156 mg IM given, 10/15/22 to be repeated every 4 weeks. Continue on Invega 3 mg Po daily    [name], MD

## 2022-10-30 NOTE — Group Note (Signed)
Date:  10/30/2022 Time:  8:30 PM  Group Topic/Focus:  Wrap-Up Group:   The focus of this group is to help patients review their daily goal of treatment and discuss progress on daily workbooks. Today we discussed what each pt. was thankful for and how their day was.    Participation Level:  Active  Participation Quality:  Attentive  Affect:  Appropriate  Cognitive:  Appropriate and Oriented  Insight: Good  Engagement in Group:  Engaged  Modes of Intervention:  Discussion  Additional Comments:  Today we discussed what each pt. was thankful for and how their day was.  Roberto Scales 10/30/2022, 8:30 PM

## 2022-10-30 NOTE — Progress Notes (Signed)
   10/30/22 1400  Psych Admission Type (Psych Patients Only)  Admission Status Voluntary  Psychosocial Assessment  Patient Complaints None  Eye Contact Glaring  Facial Expression Pained  Affect Angry  Speech Logical/coherent  Interaction Assertive;Isolative  Motor Activity Shuffling  Appearance/Hygiene Layered clothes  Behavior Characteristics Appropriate to situation  Mood Pleasant;Sullen  Thought Process  Coherency Tangential;Loose associations;Flight of ideas;Disorganized  Content Blaming others;Preoccupation  Delusions None reported or observed  Perception Hallucinations  Hallucination Auditory;Visual  Judgment Impaired  Confusion Mild  Danger to Self  Current suicidal ideation? Denies  Danger to Others  Danger to Others None reported or observed  Danger to Others Abnormal  Harmful Behavior to others No threats or harm toward other people

## 2022-10-30 NOTE — Group Note (Signed)
Recreation Therapy Group Note   Group Topic:Relaxation  Group Date: 10/30/2022 Start Time: 1400 End Time: 1440 Facilitators: Rosina Lowenstein, LRT, CTRS Location:  Craft Room  Group Description: Meditation. LRT and patients discussed what they know about meditation and mindfulness. LRT played a Deep Breathing Meditation exercise script for patients to follow along to while the lights are dimmed in the room. LRT and patients discussed how meditation and deep breathing can be used as a coping skill postdischarge to relieve stress and anxiety.   Goal Area(s) Addressed: Patient will practice using relaxation technique. Patient will identify a new coping skill.  Patient will follow multistep directions to reduce anxiety and stress.   Affect/Mood: Appropriate   Participation Level: Active and Engaged   Participation Quality: Independent   Behavior: Calm and Cooperative   Speech/Thought Process: Coherent   Insight: Good   Judgement: Good   Modes of Intervention: Activity   Patient Response to Interventions:  Attentive, Engaged, and Receptive   Education Outcome:  Acknowledges education   Clinical Observations/Individualized Feedback: Betty Henderson was active in their participation of session activities and group discussion. Pt identified that meditation is "relaxing with a breeze going". Pt briefly spoke to herself before the prompt played, however was quiet and appropriate while it was playing. Overall, pt interacted well with LRT and peers duration of session.   Plan: Continue to engage patient in RT group sessions 2-3x/week.   Rosina Lowenstein, LRT, CTRS 10/30/2022 2:47 PM

## 2022-10-31 DIAGNOSIS — F250 Schizoaffective disorder, bipolar type: Secondary | ICD-10-CM | POA: Diagnosis not present

## 2022-10-31 NOTE — Group Note (Signed)
Recreation Therapy Group Note   Group Topic:General Recreation  Group Date: 10/31/2022 Start Time: 1400 End Time: 1450 Facilitators: Rosina Lowenstein, LRT, CTRS Location:  Day Room  Group Description: Bingo. LRT and patients played multiple games of Bingo with music playing in the background. LRT and pts discussed how this could be a leisure interest and the importance of doing things they enjoy post-discharge. Patients received stress balls as bingo prizes.    Goal Area(s) Addressed: Patient will identify leisure interests.  Patient will practice healthy decision making. Patient will engage in recreation activity.    Affect/Mood: N/A   Participation Level: Did not attend    Clinical Observations/Individualized Feedback: Betty Henderson did not attend group.  Plan: Continue to engage patient in RT group sessions 2-3x/week.   Rosina Lowenstein, LRT, CTRS 10/31/2022 3:10 PM

## 2022-10-31 NOTE — Group Note (Signed)
Date:  10/31/2022 Time:  3:20 PM  Group Topic/Focus:  Making Healthy Choices:   The focus of this group is to help patients identify negative/unhealthy choices they were using prior to admission and identify positive/healthier coping strategies to replace them upon discharge.    Participation Level:  Did Not Attend    Rodena Goldmann 10/31/2022, 3:20 PM

## 2022-10-31 NOTE — Progress Notes (Signed)
D: Pt alert and oriented. Pt rates depression 0/10 and anxiety 8/10.  Pt denies experiencing any SI/HI, or AVH at this time.   A: Scheduled medications administered to pt, per MD orders. Support and encouragement provided. Frequent verbal contact made. Routine safety checks conducted q15 minutes.   R: No adverse drug reactions noted. Pt verbally contracts for safety at this time. Pt complaint with medications and treatment plan. Pt interacts well with others on the unit. Pt remains safe at this time. Will continue to monitor.

## 2022-10-31 NOTE — Progress Notes (Signed)
   10/31/22 0700  Psych Admission Type (Psych Patients Only)  Admission Status Voluntary  Psychosocial Assessment  Patient Complaints None  Eye Contact Brief  Facial Expression Flat  Affect Angry  Speech Logical/coherent  Interaction Isolative;Minimal  Motor Activity Shuffling  Appearance/Hygiene Layered clothes  Behavior Characteristics Calm;Cooperative  Mood Pleasant  Thought Process  Coherency Disorganized  Content Paranoia;Preoccupation  Delusions None reported or observed  Perception Hallucinations  Hallucination Auditory;Visual  Judgment Impaired  Confusion Mild  Danger to Self  Current suicidal ideation? Denies  Danger to Others  Danger to Others None reported or observed  Danger to Others Abnormal  Harmful Behavior to others No threats or harm toward other people  Destructive Behavior No threats or harm toward property   Patient attended group. Denies SI/HI/AVH.

## 2022-11-01 DIAGNOSIS — F250 Schizoaffective disorder, bipolar type: Secondary | ICD-10-CM | POA: Diagnosis not present

## 2022-11-01 NOTE — Progress Notes (Signed)
[facility] MD Progress Note  11/01/2022 Betty Henderson  MRN:  [id number]  Subjective: Case discussed with RN and social worker, chart reviewed, patient seen today during rounds.  No new acute events overnight.  Patient is awaiting placement.  Patient's appetite and sleep reported to be good.  Patient denies auditory or visual hallucinations.  Patient denies any thoughts of harming herself or others.  Social work is awaiting to hear from DSS on placement.   Principal Problem: Schizoaffective disorder, bipolar type (HCC) Diagnosis: Principal Problem:   Schizoaffective disorder, bipolar type (HCC)   Past Psychiatric History: Schizoaffective disorder, bipolar type.  Past Medical History:  Past Medical History:  Diagnosis Date   Anemia    Arthritis    Chronic pain    Drug-seeking behavior    Malingering    Osteopetrosis    Psychosis (HCC)    Schizoaffective disorder, bipolar type (HCC)     Past Surgical History:  Procedure Laterality Date   MOUTH SURGERY     TUBAL LIGATION     Family History:  Family History  Family history unknown: Yes   Family Psychiatric  History: Unremarkable Social History:  Social History   Substance and Sexual Activity  Alcohol Use Not Currently   Comment: 1 cocktail 3 weeks ago     Social History   Substance and Sexual Activity  Drug Use Not Currently   Types: Cocaine   Comment: states "it's legal though"    Social History   Socioeconomic History   Marital status: Single    Spouse name: Not on file   Number of children: Not on file   Years of education: Not on file   Highest education level: Not on file  Occupational History   Not on file  Tobacco Use   Smoking status: Every Day    Current packs/day: 0.50    Types: Cigarettes   Smokeless tobacco: Never  Vaping Use   Vaping status: Never Used  Substance and Sexual Activity   Alcohol use: Not Currently    Comment: 1 cocktail 3 weeks ago   Drug use: Not Currently    Types: Cocaine     Comment: states "it's legal though"   Sexual activity: Never  Other Topics Concern   Not on file  Social History Narrative   Not on file   Social Determinants of Health   Financial Resource Strain: Not on file  Food Insecurity: Food Insecurity Present (08/22/2022)   Hunger Vital Sign    Worried About Running Out of Food in the Last Year: Sometimes true    Ran Out of Food in the Last Year: Sometimes true  Transportation Needs: Patient Unable To Answer (08/22/2022)   PRAPARE - Transportation    Lack of Transportation (Medical): Patient unable to answer    Lack of Transportation (Non-Medical): Patient unable to answer  Recent Concern: Transportation Needs - Unmet Transportation Needs (06/13/2022)   Received from [facility]   [facility] - Transportation    Lack of Transportation (Medical): Not on file    Lack of Transportation (Non-Medical): Yes  Physical Activity: Not on file  Stress: Not on file  Social Connections: Unknown (08/06/2021)   Received from [facility], [facility]   Social Network    Social Network: Not on file   Additional Social History:                         Sleep: Good  Appetite:  Good  Current Medications: Current Facility-Administered Medications  Medication Dose Route Frequency Provider Last Rate Last Admin   acetaminophen (TYLENOL) tablet 650 mg  650 mg Oral Q6H PRN [name], DO   650 mg at 10/26/22 1601   alum & mag hydroxide-simeth (MAALOX/MYLANTA) 200-200-20 MG/5ML suspension 30 mL  30 mL Oral Q4H PRN [name], DO   30 mL at 09/20/22 1142   diphenhydrAMINE (BENADRYL) capsule 50 mg  50 mg Oral Q6H PRN [name], DO   50 mg at 09/29/22 2254   Or   diphenhydrAMINE (BENADRYL) injection 50 mg  50 mg Intramuscular Q6H PRN [name], DO       haloperidol (HALDOL) tablet 5 mg  5 mg Oral Q6H PRN [name], DO   5 mg at 09/28/22 2149   Or   haloperidol lactate  (HALDOL) injection 5 mg  5 mg Intramuscular Q6H PRN [name], DO       ibuprofen (ADVIL) tablet 600 mg  600 mg Oral Q6H PRN [name], [name], MD   600 mg at 10/28/22 1914   LORazepam (ATIVAN) tablet 1 mg  1 mg Oral TID PRN [name], DO   1 mg at 09/27/22 0025   magnesium hydroxide (MILK OF MAGNESIA) suspension 30 mL  30 mL Oral Daily PRN [name], DO       [START ON 11/13/2022] paliperidone (INVEGA SUSTENNA) injection 156 mg  156 mg Intramuscular Once [name], MD       paliperidone (INVEGA) 24 hr tablet 3 mg  3 mg Oral QHS [name], DO   3 mg at 10/31/22 2119   traZODone (DESYREL) tablet 50 mg  50 mg Oral QHS PRN [name], DO   50 mg at 10/31/22 2119    Lab Results: No results found for this or any previous visit (from the past 48 hour(s)).  Blood Alcohol level:  Lab Results  Component Value Date   ETH <10 08/17/2022   ETH <10 01/11/2021    Metabolic Disorder Labs: No results found for: "HGBA1C", "MPG" No results found for: "PROLACTIN" No results found for: "CHOL", "TRIG", "HDL", "CHOLHDL", "VLDL", "LDLCALC"    Musculoskeletal: Strength & Muscle Tone: within normal limits Gait & Station: normal Patient leans: N/A   Psychiatric Specialty Exam:   Presentation  General Appearance:  Casual; Neat   Eye Contact: Fair   Speech:  Garbled   Speech Volume: Normal   Handedness: Right     Mood and Affect  Mood: Goldfine"   Affect: Agitated    Thought Processes: Disorganized Probably at baseline. Improved since admission   Descriptions of Associations:Loose   Orientation:Partial   Thought Content:Denies SI/HI, delusional, at baseline   Hallucinations:Denies ,   Ideas of Reference:Paranoia, at baseline   Suicidal Thoughts:Denies SI Homicidal Thoughts:Denies HI   Sensorium  Memory: Immediate Fair; Remote Poor   Judgment: Fair, pt is Rx compliant   Insight: Limited      Executive Functions  Concentration: Poor   Attention Span: Short   Language: Fair     Psychomotor Activity  Psychomotor Activity:Variable, at times decreased, and at times increased   Assets  Assets: Communication Skills     Sleep  Sleep:Improved     Physical Exam: Physical Exam Vitals and nursing note reviewed.  Constitutional:      Appearance: Normal appearance. She is normal weight.  Neurological:     General: No focal deficit present.  Mental Status: She is alert.      Review of Systems  Constitutional: Negative.   HENT: Negative.    Eyes: Negative.   Respiratory: Negative.    Cardiovascular: Negative.   Gastrointestinal: Negative.   Genitourinary: Negative.   Musculoskeletal: Negative.   Skin: Negative.   Neurological: Negative.   Endo/Heme/Allergies: Negative.     Blood pressure 110/62, pulse 65, temperature 98.2 F (36.8 C), resp. rate 17, height 5\' 6"  (1.676 m), weight 64 kg, SpO2 100%. Body mass index is 22.76 kg/m.   Treatment Plan Summary: Daily contact with patient to assess and evaluate symptoms and progress in treatment, Medication management, and Plan continue current medications.  Invega Sustenna 156 mg IM given, 10/15/22 to be repeated every 4 weeks. Continue on Invega 3 mg Po daily    [name], MD

## 2022-11-01 NOTE — Group Note (Signed)
Recreation Therapy Group Note   Group Topic:Other  Group Date: 11/01/2022 Start Time: 1400 End Time: 1455 Facilitators: Rosina Lowenstein, LRT, CTRS Location: Courtyard and Dayroom  Group Description: Emotional Check in. Patient sat and talked with LRT about how they are doing and whatever else is on their mind. LRT provided active listening, reassurance and encouragement. Pts were given the opportunity to listen to music, nail care provided by LRT, or go outside to the courtyard for fresh air and sunlight.   Goal Area(s) Addressed: Patient will engage in conversation with LRT. Patient will communicate their wants, needs, or questions.  Patient will practice a new coping skill of "talking to someone".   Affect/Mood: Appropriate   Participation Level: Active and Engaged   Participation Quality: Independent   Behavior: Appropriate, Calm, and Cooperative   Speech/Thought Process: Coherent   Insight: Good   Judgement: Good   Modes of Intervention: Guided Discussion   Patient Response to Interventions:  Attentive, Engaged, Interested , and Receptive   Education Outcome:  Acknowledges education   Clinical Observations/Individualized Feedback: Saide was active in their participation of session activities and group discussion. Pt chose to go outside for fresh air and sunlight briefly and then came back inside for nail care. LRT cut and filed pts nails. Pt expressed appreciation and was pleasant.    Plan: Continue to engage patient in RT group sessions 2-3x/week.   Rosina Lowenstein, LRT, CTRS 11/01/2022 3:02 PM

## 2022-11-01 NOTE — Progress Notes (Signed)
[facility] MD Progress Note  10/31/2022  Betty Henderson  MRN:  [id number]  Subjective: Case discussed with RN and social worker, chart reviewed, patient seen today during rounds.  No new acute events overnight.  Patient is doing better today.   Patient denies auditory or visual hallucinations.  Patient denies any thoughts of harming herself or others.  Social work is awaiting to hear from DSS on placement.   Principal Problem: Schizoaffective disorder, bipolar type (HCC) Diagnosis: Principal Problem:   Schizoaffective disorder, bipolar type (HCC)   Past Psychiatric History: Schizoaffective disorder, bipolar type.  Past Medical History:  Past Medical History:  Diagnosis Date   Anemia    Arthritis    Chronic pain    Drug-seeking behavior    Malingering    Osteopetrosis    Psychosis (HCC)    Schizoaffective disorder, bipolar type (HCC)     Past Surgical History:  Procedure Laterality Date   MOUTH SURGERY     TUBAL LIGATION     Family History:  Family History  Family history unknown: Yes   Family Psychiatric  History: Unremarkable Social History:  Social History   Substance and Sexual Activity  Alcohol Use Not Currently   Comment: 1 cocktail 3 weeks ago     Social History   Substance and Sexual Activity  Drug Use Not Currently   Types: Cocaine   Comment: states "it's legal though"    Social History   Socioeconomic History   Marital status: Single    Spouse name: Not on file   Number of children: Not on file   Years of education: Not on file   Highest education level: Not on file  Occupational History   Not on file  Tobacco Use   Smoking status: Every Day    Current packs/day: 0.50    Types: Cigarettes   Smokeless tobacco: Never  Vaping Use   Vaping status: Never Used  Substance and Sexual Activity   Alcohol use: Not Currently    Comment: 1 cocktail 3 weeks ago   Drug use: Not Currently    Types: Cocaine    Comment: states "it's legal though"   Sexual  activity: Never  Other Topics Concern   Not on file  Social History Narrative   Not on file   Social Determinants of Health   Financial Resource Strain: Not on file  Food Insecurity: Food Insecurity Present (08/22/2022)   Hunger Vital Sign    Worried About Running Out of Food in the Last Year: Sometimes true    Ran Out of Food in the Last Year: Sometimes true  Transportation Needs: Patient Unable To Answer (08/22/2022)   PRAPARE - Transportation    Lack of Transportation (Medical): Patient unable to answer    Lack of Transportation (Non-Medical): Patient unable to answer  Recent Concern: Transportation Needs - Unmet Transportation Needs (06/13/2022)   Received from [facility], [facility]   [facility] - Transportation    Lack of Transportation (Medical): Not on file    Lack of Transportation (Non-Medical): Yes  Physical Activity: Not on file  Stress: Not on file  Social Connections: Unknown (08/06/2021)   Received from [facility], [facility]   Social Network    Social Network: Not on file   Additional Social History:                         Sleep: Good  Appetite:  Good  Current Medications: Current Facility-Administered Medications  Medication Dose Route Frequency Provider Last Rate Last Admin   acetaminophen (TYLENOL) tablet 650 mg  650 mg Oral Q6H PRN [name], DO   650 mg at 10/26/22 1601   alum & mag hydroxide-simeth (MAALOX/MYLANTA) 200-200-20 MG/5ML suspension 30 mL  30 mL Oral Q4H PRN [name], DO   30 mL at 09/20/22 1142   diphenhydrAMINE (BENADRYL) capsule 50 mg  50 mg Oral Q6H PRN [name], DO   50 mg at 09/29/22 2254   Or   diphenhydrAMINE (BENADRYL) injection 50 mg  50 mg Intramuscular Q6H PRN [name], DO       haloperidol (HALDOL) tablet 5 mg  5 mg Oral Q6H PRN [name], DO   5 mg at 09/28/22 2149   Or   haloperidol lactate (HALDOL) injection 5 mg  5 mg Intramuscular  Q6H PRN [name], DO       ibuprofen (ADVIL) tablet 600 mg  600 mg Oral Q6H PRN [name], [name], MD   600 mg at 10/28/22 6045   LORazepam (ATIVAN) tablet 1 mg  1 mg Oral TID PRN [name], DO   1 mg at 09/27/22 0025   magnesium hydroxide (MILK OF MAGNESIA) suspension 30 mL  30 mL Oral Daily PRN [name], DO       [START ON 11/13/2022] paliperidone (INVEGA SUSTENNA) injection 156 mg  156 mg Intramuscular Once [name], MD       paliperidone (INVEGA) 24 hr tablet 3 mg  3 mg Oral QHS [name], DO   3 mg at 10/31/22 2119   traZODone (DESYREL) tablet 50 mg  50 mg Oral QHS PRN [name], DO   50 mg at 10/31/22 2119    Lab Results: No results found for this or any previous visit (from the past 48 hour(s)).  Blood Alcohol level:  Lab Results  Component Value Date   ETH <10 08/17/2022   ETH <10 01/11/2021    Metabolic Disorder Labs: No results found for: "HGBA1C", "MPG" No results found for: "PROLACTIN" No results found for: "CHOL", "TRIG", "HDL", "CHOLHDL", "VLDL", "LDLCALC"    Musculoskeletal: Strength & Muscle Tone: within normal limits Gait & Station: normal Patient leans: N/A   Psychiatric Specialty Exam:   Presentation  General Appearance:  Casual; Neat   Eye Contact: Fair   Speech:  Garbled   Speech Volume: Normal   Handedness: Right     Mood and Affect  Mood: Goldfine"   Affect: Agitated    Thought Processes: Disorganized Probably at baseline. Improved since admission   Descriptions of Associations:Loose   Orientation:Partial   Thought Content:Denies SI/HI, delusional, at baseline   Hallucinations:Denies ,   Ideas of Reference:Paranoia, at baseline   Suicidal Thoughts:Denies SI Homicidal Thoughts:Denies HI   Sensorium  Memory: Immediate Fair; Remote Poor   Judgment: Fair, pt is Rx compliant   Insight: Limited     Executive Functions   Concentration: Poor   Attention Span: Short   Language: Fair     Psychomotor Activity  Psychomotor Activity:Variable, at times decreased, and at times increased   Assets  Assets: Communication Skills     Sleep  Sleep:Improved     Physical Exam: Physical Exam Vitals and nursing note reviewed.  Constitutional:      Appearance: Normal appearance. She is normal weight.  Neurological:     General: No focal deficit present.     Mental Status: She is alert.  Review of Systems  Constitutional: Negative.   HENT: Negative.    Eyes: Negative.   Respiratory: Negative.    Cardiovascular: Negative.   Gastrointestinal: Negative.   Genitourinary: Negative.   Musculoskeletal: Negative.   Skin: Negative.   Neurological: Negative.   Endo/Heme/Allergies: Negative.     Blood pressure 110/62, pulse 65, temperature 98.2 F (36.8 C), resp. rate 17, height 5\' 6"  (1.676 m), weight 64 kg, SpO2 100%. Body mass index is 22.76 kg/m.   Treatment Plan Summary: Daily contact with patient to assess and evaluate symptoms and progress in treatment, Medication management, and Plan continue current medications.  Invega Sustenna 156 mg IM given, 10/15/22 to be repeated every 4 weeks. Continue on Invega 3 mg Po daily    [name], MD

## 2022-11-01 NOTE — [place] FL2 (Signed)
[place] MEDICAID FL2 LEVEL OF CARE FORM     IDENTIFICATION  Patient Name: Betty Henderson Birthdate: 08/20/1960 Sex: female Admission Date (Current Location): 08/22/2022  [place] Number:  Chiropodist and Address:  [facility], [address], [place], [place] [address]      Provider Number: [id number]  Attending Physician Name and Address:  [name],*  Relative Name and Phone Number:  Sinead Hlavac (son) [phone]    Current Level of Care: Hospital Recommended Level of Care: Assisted Living Facility, Family Care Home Prior Approval Number:    Date Approved/Denied:   PASRR Number:    Discharge Plan: Other (Comment)    Current Diagnoses: Patient Active Problem List   Diagnosis Date Noted   Schizoaffective disorder, bipolar type (HCC) 08/19/2022   Midline low back pain without sciatica 06/14/2022   Malingering 05/31/2022   Neck pain 05/08/2022   Non compliance w medication regimen 02/07/2020   Mild cognitive impairment 10/12/2018   Tobacco use disorder 09/14/2018   Homeless single person 08/22/2017   Arthritis 05/04/2016   Low vitamin B12 level 05/04/2016   Closed avulsion fracture of distal end of left fibula 11/18/2014   Sprain of left ankle 11/18/2014   Acute exacerbation of chronic paranoid schizophrenia (HCC) 09/03/2014    Orientation RESPIRATION BLADDER Height & Weight     Self, Situation  Normal Continent Weight: 141 lb (64 kg) Height:  5\' 6"  (167.6 cm)  BEHAVIORAL SYMPTOMS/MOOD NEUROLOGICAL BOWEL NUTRITION STATUS  [place]ande[place]   Continent    AMBULATORY STATUS COMMUNICATION OF NEEDS Skin   Independent Verbally Normal                       Personal Care Assistance Level of Assistance              Functional Limitations Info  Speech     Speech Info: Adequate    SPECIAL CARE FACTORS FREQUENCY                       Contractures Contractures Info: Not present     Additional Factors Info  Psychotropic     Psychotropic Info: paliperidone (INVEGA SUSTENNA) injection 156 mg, paliperidone (INVEGA) 24 hr tablet 3 mg         Current Medications (11/01/2022):  This is the current hospital active medication list Current Facility-Administered Medications  Medication Dose Route Frequency Provider Last Rate Last Admin   acetaminophen (TYLENOL) tablet 650 mg  650 mg Oral Q6H PRN [name], DO   650 mg at 10/26/22 1601   alum & mag hydroxide-simeth (MAALOX/MYLANTA) 200-200-20 MG/5ML suspension 30 mL  30 mL Oral Q4H PRN [name], DO   30 mL at 09/20/22 1142   diphenhydrAMINE (BENADRYL) capsule 50 mg  50 mg Oral Q6H PRN [name], DO   50 mg at 09/29/22 2254   Or   diphenhydrAMINE (BENADRYL) injection 50 mg  50 mg Intramuscular Q6H PRN [name], DO       haloperidol (HALDOL) tablet 5 mg  5 mg Oral Q6H PRN [name], DO   5 mg at 09/28/22 2149   Or   haloperidol lactate (HALDOL) injection 5 mg  5 mg Intramuscular Q6H PRN [name], DO       ibuprofen (ADVIL) tablet 600 mg  600 mg Oral Q6H PRN [name], [name], MD   600 mg at 10/28/22  7829   LORazepam (ATIVAN) tablet 1 mg  1 mg Oral TID PRN [name], DO   1 mg at 09/27/22 0025   magnesium hydroxide (MILK OF MAGNESIA) suspension 30 mL  30 mL Oral Daily PRN [name], DO       [START ON 11/13/2022] paliperidone (INVEGA SUSTENNA) injection 156 mg  156 mg Intramuscular Once [name], MD       paliperidone (INVEGA) 24 hr tablet 3 mg  3 mg Oral QHS [name], DO   3 mg at 10/31/22 2119   traZODone (DESYREL) tablet 50 mg  50 mg Oral QHS PRN [name], DO   50 mg at 10/31/22 2119     Discharge Medications: Please see discharge summary for a list of discharge medications.  Relevant Imaging Results:  Relevant Lab Results:   Additional Information Pt is currently  awaiting guardianship from the state, her petition has been filed. She should also be receiving Social Security soon as well.  Elza Rafter, [place]

## 2022-11-01 NOTE — Plan of Care (Signed)
Calm and cooperative. Pt remain in bed throughout the shift. Limited interaction with peers and staff. Po med compliant. No behavior issues noted. Denies SI/HI/AVH. No c/o pain/discomfort noted.  Problem: Nutrition: Goal: Adequate nutrition will be maintained Outcome: Progressing   Problem: Coping: Goal: Level of anxiety will decrease Outcome: Progressing   Problem: Pain Managment: Goal: General experience of comfort will improve Outcome: Progressing   Problem: Safety: Goal: Ability to remain free from injury will improve Outcome: Progressing   Problem: Skin Integrity: Goal: Risk for impaired skin integrity will decrease Outcome: Progressing

## 2022-11-01 NOTE — Plan of Care (Signed)
  Problem: Education: Goal: Knowledge of General Education information will improve Description: Including pain rating scale, medication(s)/side effects and non-pharmacologic comfort measures 11/01/2022 1734 by Lenny Pastel, RN Outcome: Progressing 11/01/2022 0903 by Lenny Pastel, RN Outcome: Progressing   Problem: Nutrition: Goal: Adequate nutrition will be maintained 11/01/2022 1734 by Orbie Hurst T, RN Outcome: Progressing 11/01/2022 0903 by Lenny Pastel, RN Outcome: Progressing   Problem: Coping: Goal: Level of anxiety will decrease 11/01/2022 1734 by Orbie Hurst T, RN Outcome: Progressing 11/01/2022 0903 by Lenny Pastel, RN Outcome: Progressing   Problem: Safety: Goal: Ability to remain free from injury will improve Outcome: Progressing

## 2022-11-01 NOTE — Progress Notes (Signed)
Patient present in the dayroom for breakfast.  Calm, cooperative and pleasant.  Denies SI/HI.  Responds to internal stimuli occasionally.  Isolates to room with exception of meals.  No scheduled medications.  15 min checks in place for safety.  No behavioral issues.

## 2022-11-01 NOTE — Group Note (Signed)

## 2022-11-01 NOTE — Group Note (Signed)
Date:  11/01/2022 Time:  8:59 PM  Group Topic/Focus:  Self Care:   The focus of this group is to help patients understand the importance of self-care in order to improve or restore emotional, physical, spiritual, interpersonal, and financial health.    Participation Level:  Minimal  Participation Quality:  Appropriate  Affect:  Appropriate  Cognitive:  Alert  Insight: Appropriate  Engagement in Group:  Limited  Modes of Intervention:  Socialization  Additional Comments:    [name] 11/01/2022, 8:59 PM

## 2022-11-01 NOTE — BHH Counselor (Signed)
CSW faxed required paperwork for referral too [facility] per their request. (F: [phone])  Reynaldo Minium, MSW, [facility] 11/01/2022 3:17 PM

## 2022-11-01 NOTE — Plan of Care (Signed)

## 2022-11-02 DIAGNOSIS — F250 Schizoaffective disorder, bipolar type: Secondary | ICD-10-CM | POA: Diagnosis not present

## 2022-11-02 NOTE — Group Note (Deleted)
Date:  11/02/2022 Time:  2:12 AM  Group Topic/Focus:  Wrap-Up Group:   The focus of this group is to help patients review their daily goal of treatment and discuss progress on daily workbooks.     Participation Level:  {BHH PARTICIPATION ZOXWR:60454}  Participation Quality:  {BHH PARTICIPATION QUALITY:22265}  Affect:  {BHH AFFECT:22266}  Cognitive:  {BHH COGNITIVE:22267}  Insight: {BHH Insight2:20797}  Engagement in Group:  {BHH ENGAGEMENT IN UJWJX:91478}  Modes of Intervention:  {BHH MODES OF INTERVENTION:22269}  Additional Comments:  ***  Maglione,Jaclyn Carew E 11/02/2022, 2:12 AM

## 2022-11-02 NOTE — Plan of Care (Signed)
  Problem: Education: Goal: Knowledge of General Education information will improve Description: Including pain rating scale, medication(s)/side effects and non-pharmacologic comfort measures Outcome: Progressing   Problem: Health Behavior/Discharge Planning: Goal: Ability to manage health-related needs will improve Outcome: Progressing   Problem: Clinical Measurements: Goal: Ability to maintain clinical measurements within normal limits will improve Outcome: Progressing Goal: Diagnostic test results will improve Outcome: Progressing   Problem: Nutrition: Goal: Adequate nutrition will be maintained Outcome: Progressing   Problem: Coping: Goal: Level of anxiety will decrease Outcome: Progressing   Problem: Elimination: Goal: Will not experience complications related to bowel motility Outcome: Progressing Goal: Will not experience complications related to urinary retention Outcome: Progressing   Problem: Pain Managment: Goal: General experience of comfort will improve Outcome: Progressing   Problem: Safety: Goal: Ability to remain free from injury will improve Outcome: Progressing   Problem: Skin Integrity: Goal: Risk for impaired skin integrity will decrease Outcome: Progressing

## 2022-11-02 NOTE — Progress Notes (Signed)
D-Pt is alert and and oriented . Pt rates depression and anxiety 0/10. Pt reports pain of a 10/10. Nurse administered tylenol. Pt denies  any SI/HI or AVH at this time.   A- Scheduled meds administered per MD orders. Support and encouragement provided frequent verbal contact made.   R-No adverse drug reactions noted. Pt verbally contracts for safety at this time. Pt  complaint with medications and treatment plan. Pt reported staying in her room for most part of the day . Pt remians safe at this time

## 2022-11-02 NOTE — Plan of Care (Signed)
D: Pt alert and oriented. Pt denies experiencing any anxiety or depression at this time. Pt denies experiencing any pain at this time. Pt denies experiencing any SI/HI, or AVH at this time.   A: Support and encouragement provided. Frequent verbal contact made. Routine safety checks conducted q15 minutes.   R: Pt verbally contracts for safety at this time. Pt interacts well but minimally with others on the unit mostly self isolating to her room with exception to meals and occasionally groups. Pt remains safe at this time. Plan of care ongoing.  Problem: Nutrition: Goal: Adequate nutrition will be maintained Outcome: Progressing   Problem: Coping: Goal: Level of anxiety will decrease Outcome: Progressing

## 2022-11-02 NOTE — Progress Notes (Addendum)
[facility] MD Progress Note  11/02/2022 Betty Henderson  MRN:  [id number]  Subjective: Case discussed with RN and social worker, chart reviewed, patient seen today during rounds.  No new acute events overnight.  Patient is awaiting placement.  Patient denies auditory or visual hallucinations.  Patient denies any thoughts of harming herself or others.  Social work is awaiting to hear from DSS on placement.   Principal Problem: Schizoaffective disorder, bipolar type (HCC) Diagnosis: Principal Problem:   Schizoaffective disorder, bipolar type (HCC)   Past Psychiatric History: Schizoaffective disorder, bipolar type.  Past Medical History:  Past Medical History:  Diagnosis Date   Anemia    Arthritis    Chronic pain    Drug-seeking behavior    Malingering    Osteopetrosis    Psychosis (HCC)    Schizoaffective disorder, bipolar type (HCC)     Past Surgical History:  Procedure Laterality Date   MOUTH SURGERY     TUBAL LIGATION     Family History:  Family History  Family history unknown: Yes   Family Psychiatric  History: Unremarkable Social History:  Social History   Substance and Sexual Activity  Alcohol Use Not Currently   Comment: 1 cocktail 3 weeks ago     Social History   Substance and Sexual Activity  Drug Use Not Currently   Types: Cocaine   Comment: states "it's legal though"    Social History   Socioeconomic History   Marital status: Single    Spouse name: Not on file   Number of children: Not on file   Years of education: Not on file   Highest education level: Not on file  Occupational History   Not on file  Tobacco Use   Smoking status: Every Day    Current packs/day: 0.50    Types: Cigarettes   Smokeless tobacco: Never  Vaping Use   Vaping status: Never Used  Substance and Sexual Activity   Alcohol use: Not Currently    Comment: 1 cocktail 3 weeks ago   Drug use: Not Currently    Types: Cocaine    Comment: states "it's legal though"   Sexual  activity: Never  Other Topics Concern   Not on file  Social History Narrative   Not on file   Social Determinants of Health   Financial Resource Strain: Not on file  Food Insecurity: Food Insecurity Present (08/22/2022)   Hunger Vital Sign    Worried About Running Out of Food in the Last Year: Sometimes true    Ran Out of Food in the Last Year: Sometimes true  Transportation Needs: Patient Unable To Answer (08/22/2022)   PRAPARE - Transportation    Lack of Transportation (Medical): Patient unable to answer    Lack of Transportation (Non-Medical): Patient unable to answer  Recent Concern: Transportation Needs - Unmet Transportation Needs (06/13/2022)   Received from [facility], [facility]   [facility] - Transportation    Lack of Transportation (Medical): Not on file    Lack of Transportation (Non-Medical): Yes  Physical Activity: Not on file  Stress: Not on file  Social Connections: Unknown (08/06/2021)   Received from [facility], [facility]   Social Network    Social Network: Not on file   Additional Social History:                         Sleep: Good  Appetite:  Good  Current Medications: Current Facility-Administered Medications  Medication Dose  Route Frequency Provider Last Rate Last Admin   acetaminophen (TYLENOL) tablet 650 mg  650 mg Oral Q6H PRN [name], DO   650 mg at 11/01/22 2104   alum & mag hydroxide-simeth (MAALOX/MYLANTA) 200-200-20 MG/5ML suspension 30 mL  30 mL Oral Q4H PRN [name], DO   30 mL at 09/20/22 1142   diphenhydrAMINE (BENADRYL) capsule 50 mg  50 mg Oral Q6H PRN [name], DO   50 mg at 09/29/22 2254   Or   diphenhydrAMINE (BENADRYL) injection 50 mg  50 mg Intramuscular Q6H PRN [name], DO       haloperidol (HALDOL) tablet 5 mg  5 mg Oral Q6H PRN [name], DO   5 mg at 09/28/22 2149   Or   haloperidol lactate (HALDOL) injection 5 mg  5 mg Intramuscular  Q6H PRN [name], DO       ibuprofen (ADVIL) tablet 600 mg  600 mg Oral Q6H PRN [name], [name], MD   600 mg at 10/28/22 0981   LORazepam (ATIVAN) tablet 1 mg  1 mg Oral TID PRN [name], DO   1 mg at 09/27/22 0025   magnesium hydroxide (MILK OF MAGNESIA) suspension 30 mL  30 mL Oral Daily PRN [name], DO       [START ON 11/13/2022] paliperidone (INVEGA SUSTENNA) injection 156 mg  156 mg Intramuscular Once [name], MD       paliperidone (INVEGA) 24 hr tablet 3 mg  3 mg Oral QHS [name], DO   3 mg at 11/01/22 2104   traZODone (DESYREL) tablet 50 mg  50 mg Oral QHS PRN [name], DO   50 mg at 10/31/22 2119    Lab Results: No results found for this or any previous visit (from the past 48 hour(s)).  Blood Alcohol level:  Lab Results  Component Value Date   ETH <10 08/17/2022   ETH <10 01/11/2021    Metabolic Disorder Labs: No results found for: "HGBA1C", "MPG" No results found for: "PROLACTIN" No results found for: "CHOL", "TRIG", "HDL", "CHOLHDL", "VLDL", "LDLCALC"    Musculoskeletal: Strength & Muscle Tone: within normal limits Gait & Station: normal Patient leans: N/A   Psychiatric Specialty Exam:   Presentation  General Appearance:  Casual; Neat   Eye Contact: Fair   Speech:  Garbled   Speech Volume: Normal   Handedness: Right     Mood and Affect  Mood: Goldfine"   Affect: Agitated    Thought Processes: Disorganized Probably at baseline. Improved since admission   Descriptions of Associations:Loose   Orientation:Partial   Thought Content:Denies SI/HI, delusional, at baseline   Hallucinations:Denies ,   Ideas of Reference:Paranoia, at baseline   Suicidal Thoughts:Denies SI Homicidal Thoughts:Denies HI   Sensorium  Memory: Immediate Fair; Remote Poor   Judgment: Fair, pt is Rx compliant   Insight: Limited     Executive Functions   Concentration: Poor   Attention Span: Short   Language: Fair     Psychomotor Activity  Psychomotor Activity:Variable, at times decreased, and at times increased   Assets  Assets: Communication Skills     Sleep  Sleep:Improved     Physical Exam: Physical Exam Vitals and nursing note reviewed.  Constitutional:      Appearance: Normal appearance. She is normal weight.  Neurological:     General: No focal deficit present.     Mental Status: She is alert.  Review of Systems  Constitutional: Negative.   HENT: Negative.    Eyes: Negative.   Respiratory: Negative.    Cardiovascular: Negative.   Gastrointestinal: Negative.   Genitourinary: Negative.   Musculoskeletal: Negative.   Skin: Negative.   Neurological: Negative.   Endo/Heme/Allergies: Negative.     Blood pressure 120/79, pulse 69, temperature 97.9 F (36.6 C), resp. rate 18, height 5\' 6"  (1.676 m), weight 64 kg, SpO2 99%. Body mass index is 22.76 kg/m.   Treatment Plan Summary: Daily contact with patient to assess and evaluate symptoms and progress in treatment, Medication management, and Plan continue current medications.  Invega Sustenna 156 mg IM given, 10/15/22 to be repeated every 4 weeks. Continue on Invega 3 mg Po daily    [name], MD

## 2022-11-02 NOTE — Group Note (Signed)
Date:  11/02/2022 Time:  8:37 PM  Group Topic/Focus:  Conflict Resolution:   The focus of this group is to discuss the conflict resolution process and how it may be used upon discharge. Healthy Communication:   The focus of this group is to discuss communication, barriers to communication, as well as healthy ways to communicate with others. Making Healthy Choices:   The focus of this group is to help patients identify negative/unhealthy choices they were using prior to admission and identify positive/healthier coping strategies to replace them upon discharge. Managing Feelings:   The focus of this group is to identify what feelings patients have difficulty handling and develop a plan to handle them in a healthier way upon discharge. Personal Choices and Values:   The focus of this group is to help patients assess and explore the importance of values in their lives, how their values affect their decisions, how they express their values and what opposes their expression.    Participation Level:  Did Not Attend  Participation Quality:   Did Not Attend  Affect:   Did Not Attend  Cognitive:   Did Not Attend  Insight: None  Engagement in Group:  None  Modes of Intervention:  Socialization  Additional Comments:    [name] 11/02/2022, 8:37 PM

## 2022-11-02 NOTE — BH IP Treatment Plan (Unsigned)
Interdisciplinary Treatment and Diagnostic Plan Update  11/02/2022 Time of Session: 10:06 am Betty Henderson MRN: [id number]  Principal Diagnosis: Schizoaffective disorder, bipolar type [facility])  Secondary Diagnoses: Principal Problem:   Schizoaffective disorder, bipolar type (HCC)   Current Medications:  Current Facility-Administered Medications  Medication Dose Route Frequency Provider Last Rate Last Admin   acetaminophen (TYLENOL) tablet 650 mg  650 mg Oral Q6H PRN [name], DO   650 mg at 11/01/22 2104   alum & mag hydroxide-simeth (MAALOX/MYLANTA) 200-200-20 MG/5ML suspension 30 mL  30 mL Oral Q4H PRN [name], DO   30 mL at 09/20/22 1142   diphenhydrAMINE (BENADRYL) capsule 50 mg  50 mg Oral Q6H PRN [name], DO   50 mg at 09/29/22 2254   Or   diphenhydrAMINE (BENADRYL) injection 50 mg  50 mg Intramuscular Q6H PRN [name], DO       haloperidol (HALDOL) tablet 5 mg  5 mg Oral Q6H PRN [name], DO   5 mg at 09/28/22 2149   Or   haloperidol lactate (HALDOL) injection 5 mg  5 mg Intramuscular Q6H PRN [name], DO       ibuprofen (ADVIL) tablet 600 mg  600 mg Oral Q6H PRN [name], [name], MD   600 mg at 10/28/22 4696   LORazepam (ATIVAN) tablet 1 mg  1 mg Oral TID PRN [name], DO   1 mg at 09/27/22 0025   magnesium hydroxide (MILK OF MAGNESIA) suspension 30 mL  30 mL Oral Daily PRN [name], DO       [START ON 11/13/2022] paliperidone (INVEGA SUSTENNA) injection 156 mg  156 mg Intramuscular Once [name], MD       paliperidone (INVEGA) 24 hr tablet 3 mg  3 mg Oral QHS [name], DO   3 mg at 11/01/22 2104   traZODone (DESYREL) tablet 50 mg  50 mg Oral QHS PRN [name], DO   50 mg at 10/31/22 2119   PTA Medications: Medications Prior to Admission  Medication Sig Dispense Refill Last Dose   ondansetron (ZOFRAN) 4 MG tablet Take 1  tablet (4 mg total) by mouth every 8 (eight) hours as needed for nausea or vomiting. (Patient not taking: Reported on 08/17/2022) 20 tablet 0     Patient Stressors: Financial difficulties   Health problems   Medication change or noncompliance    Patient Strengths: Ability for insight   Treatment Modalities: Medication Management, Group therapy, Case management,  1 to 1 session with clinician, Psychoeducation, Recreational therapy.   Physician Treatment Plan for Primary Diagnosis: Schizoaffective disorder, bipolar type (HCC) Long Term Goal(s): Improvement in symptoms so as ready for discharge   Short Term Goals: Ability to identify changes in lifestyle to reduce recurrence of condition will improve Ability to verbalize feelings will improve Ability to disclose and discuss suicidal ideas Ability to demonstrate self-control will improve Ability to identify and develop effective coping behaviors will improve Ability to maintain clinical measurements within normal limits will improve Compliance with prescribed medications will improve Ability to identify triggers associated with substance abuse/mental health issues will improve  Medication Management: Evaluate patient's response, side effects, and tolerance of medication regimen.  Therapeutic Interventions: 1 to 1 sessions, Unit Group sessions and Medication administration.  Evaluation of Outcomes: Progressing  Physician Treatment Plan for Secondary Diagnosis: Principal Problem:   Schizoaffective disorder, bipolar type (HCC)  Long Term Goal(s): Improvement in symptoms so  as ready for discharge   Short Term Goals: Ability to identify changes in lifestyle to reduce recurrence of condition will improve Ability to verbalize feelings will improve Ability to disclose and discuss suicidal ideas Ability to demonstrate self-control will improve Ability to identify and develop effective coping behaviors will improve Ability to maintain  clinical measurements within normal limits will improve Compliance with prescribed medications will improve Ability to identify triggers associated with substance abuse/mental health issues will improve     Medication Management: Evaluate patient's response, side effects, and tolerance of medication regimen.  Therapeutic Interventions: 1 to 1 sessions, Unit Group sessions and Medication administration.  Evaluation of Outcomes: Progressing   RN Treatment Plan for Primary Diagnosis: Schizoaffective disorder, bipolar type (HCC) Long Term Goal(s): Knowledge of disease and therapeutic regimen to maintain health will improve  Short Term Goals: Ability to remain free from injury will improve, Ability to verbalize frustration and anger appropriately will improve, Ability to demonstrate self-control, Ability to participate in decision making will improve, Ability to verbalize feelings will improve, Ability to disclose and discuss suicidal ideas, Ability to identify and develop effective coping behaviors will improve, and Compliance with prescribed medications will improve  Medication Management: RN will administer medications as ordered by provider, will assess and evaluate patient's response and provide education to patient for prescribed medication. RN will report any adverse and/or side effects to prescribing provider.  Therapeutic Interventions: 1 on 1 counseling sessions, Psychoeducation, Medication administration, Evaluate responses to treatment, Monitor vital signs and CBGs as ordered, Perform/monitor CIWA, COWS, AIMS and Fall Risk screenings as ordered, Perform wound care treatments as ordered.  Evaluation of Outcomes: Progressing   LCSW Treatment Plan for Primary Diagnosis: Schizoaffective disorder, bipolar type (HCC) Long Term Goal(s): Safe transition to appropriate next level of care at discharge, Engage patient in therapeutic group addressing interpersonal concerns.  Short Term Goals:  Engage patient in aftercare planning with referrals and resources, Increase social support, Increase ability to appropriately verbalize feelings, Increase emotional regulation, Facilitate acceptance of mental health diagnosis and concerns, Identify triggers associated with mental health/substance abuse issues, and Increase skills for wellness and recovery  Therapeutic Interventions: Assess for all discharge needs, 1 to 1 time with Social worker, Explore available resources and support systems, Assess for adequacy in community support network, Educate family and significant other(s) on suicide prevention, Complete Psychosocial Assessment, Interpersonal group therapy.  Evaluation of Outcomes: Progressing   Progress in Treatment: Attending groups: No. Participating in groups: No. Taking medication as prescribed: Yes. Toleration medication: Yes. Family/Significant other contact made: Yes, individual(s) contacted:  Sharlon Schueler, son, [phone] Patient understands diagnosis: Yes. Discussing patient identified problems/goals with staff: Yes. Medical problems stabilized or resolved: Yes. Denies suicidal/homicidal ideation: Yes. Issues/concerns per patient self-inventory: No. Other: none  New Short Term/Long Term Goal(s): Update 6/30: none at this time. Update 09/27/2022:  No changes at this time.  Update 10/02/2022:  No changes at this time. Update 10/07/22: No changes at this time 10/12/22: No changes at this time Update 10/18/22: No changes at this time Update 10/23/22: No changes at this time Update 10/28/22: No changes at this time Update 11/02/22: none at this time.     Patient Goals:  Update 6/30: none at this time. Update 09/27/2022:  No changes at this time. Update 10/02/2022:  No changes at this time. Update 10/07/22: No changes at this time 10/12/22: No changes at this time Update 10/18/22: No changes at this time Update 10/23/22: No changes at this time Update 10/28/22:  No changes at this time   Update 11/02/22: none at this time.      Discharge Plan or Barriers: Update 6/30: APS report has been made and patient is being investigated for guardianship needs.  Remains homeless with limited supports. Update 09/27/2022:  No changes at this time.  Update 10/02/2022:  Patient remains safe on the unit at this time.  Patient remains psychotic at this time.  APS report has been made, however, no follow up from the caseworker on her case.  No safe discharge identified.  CSW has requested that application for Medicaid and disability be completed.   Update 10/07/22: No changes at this time 10/12/22: No changes at this time Update 10/18/22: No changes at this time Update 10/23/22: No changes at this time Update 10/23/22: No changes at this time Update 10/28/22: According to [facility], pt's caseworker at DSS, the petition was filed for guardianship on 10/21/22, now awaiting for the petition to be approved Update 11/02/22: none at this time.    Reason for Continuation of Hospitalization: Hallucinations Mania Medication stabilization  Estimated Length of Stay: Update 6/30: 1-7 days Update 09/27/2022:  TBD Update 10/02/2022:  No changes at this time. UpdaUpdate 10/18/22: No changes at this time te 10/07/22: No changes at this time Update 10/18/22: No changes at this time Update 10/23/22: No changes at this time Update 10/28/22: No changes at this time   Update 11/02/22: none at this time.  Last 3 [place] Suicide Severity Risk Score: Flowsheet Row Admission (Current) from 08/22/2022 in [facility] BEHAVIORAL MEDICINE ED from 08/17/2022 in [facility] Emergency Department at [facility] ED from 08/12/2022 in [facility] Emergency Department at [facility]  C-SSRS RISK CATEGORY Low Risk No Risk No Risk       Last [facility] 2/9 Scores:     No data to display          Scribe for Treatment Team: Marshell Levan, LCSW 11/02/2022 10:06 AM

## 2022-11-03 DIAGNOSIS — F250 Schizoaffective disorder, bipolar type: Secondary | ICD-10-CM | POA: Diagnosis not present

## 2022-11-03 NOTE — Progress Notes (Addendum)
[facility] MD Progress Note  11/03/2022 Betty Henderson  MRN:  [id number]  Subjective: Case discussed with RN and social worker, chart reviewed, patient seen today during rounds.  No new acute events overnight.  Patient is awaiting placement.  Patient denies auditory or visual hallucinations.  Patient denies any thoughts of harming herself or others.  Social work is awaiting to hear from DSS on placement.   Principal Problem: Schizoaffective disorder, bipolar type (HCC) Diagnosis: Principal Problem:   Schizoaffective disorder, bipolar type (HCC)   Past Psychiatric History: Schizoaffective disorder, bipolar type.  Past Medical History:  Past Medical History:  Diagnosis Date   Anemia    Arthritis    Chronic pain    Drug-seeking behavior    Malingering    Osteopetrosis    Psychosis (HCC)    Schizoaffective disorder, bipolar type (HCC)     Past Surgical History:  Procedure Laterality Date   MOUTH SURGERY     TUBAL LIGATION     Family History:  Family History  Family history unknown: Yes   Family Psychiatric  History: Unremarkable Social History:  Social History   Substance and Sexual Activity  Alcohol Use Not Currently   Comment: 1 cocktail 3 weeks ago     Social History   Substance and Sexual Activity  Drug Use Not Currently   Types: Cocaine   Comment: states "it's legal though"    Social History   Socioeconomic History   Marital status: Single    Spouse name: Not on file   Number of children: Not on file   Years of education: Not on file   Highest education level: Not on file  Occupational History   Not on file  Tobacco Use   Smoking status: Every Day    Current packs/day: 0.50    Types: Cigarettes   Smokeless tobacco: Never  Vaping Use   Vaping status: Never Used  Substance and Sexual Activity   Alcohol use: Not Currently    Comment: 1 cocktail 3 weeks ago   Drug use: Not Currently    Types: Cocaine    Comment: states "it's legal though"   Sexual  activity: Never  Other Topics Concern   Not on file  Social History Narrative   Not on file   Social Determinants of Health   Financial Resource Strain: Not on file  Food Insecurity: Food Insecurity Present (08/22/2022)   Hunger Vital Sign    Worried About Running Out of Food in the Last Year: Sometimes true    Ran Out of Food in the Last Year: Sometimes true  Transportation Needs: Patient Unable To Answer (08/22/2022)   PRAPARE - Transportation    Lack of Transportation (Medical): Patient unable to answer    Lack of Transportation (Non-Medical): Patient unable to answer  Recent Concern: Transportation Needs - Unmet Transportation Needs (06/13/2022)   Received from [facility]   [facility] - Transportation    Lack of Transportation (Medical): Not on file    Lack of Transportation (Non-Medical): Yes  Physical Activity: Not on file  Stress: Not on file  Social Connections: Unknown (08/06/2021)   Received from [facility], [facility]   Social Network    Social Network: Not on file   Additional Social History:                         Sleep: Good  Appetite:  Good  Current Medications: Current Facility-Administered Medications  Medication Dose  Route Frequency Provider Last Rate Last Admin   acetaminophen (TYLENOL) tablet 650 mg  650 mg Oral Q6H PRN [name], DO   650 mg at 11/01/22 2104   alum & mag hydroxide-simeth (MAALOX/MYLANTA) 200-200-20 MG/5ML suspension 30 mL  30 mL Oral Q4H PRN [name], DO   30 mL at 09/20/22 1142   diphenhydrAMINE (BENADRYL) capsule 50 mg  50 mg Oral Q6H PRN [name], DO   50 mg at 09/29/22 2254   Or   diphenhydrAMINE (BENADRYL) injection 50 mg  50 mg Intramuscular Q6H PRN [name], DO       haloperidol (HALDOL) tablet 5 mg  5 mg Oral Q6H PRN [name], DO   5 mg at 09/28/22 2149   Or   haloperidol lactate (HALDOL) injection 5 mg  5 mg Intramuscular  Q6H PRN [name], DO       ibuprofen (ADVIL) tablet 600 mg  600 mg Oral Q6H PRN [name], [name], MD   600 mg at 10/28/22 1610   LORazepam (ATIVAN) tablet 1 mg  1 mg Oral TID PRN [name], DO   1 mg at 09/27/22 0025   magnesium hydroxide (MILK OF MAGNESIA) suspension 30 mL  30 mL Oral Daily PRN [name], DO       [START ON 11/13/2022] paliperidone (INVEGA SUSTENNA) injection 156 mg  156 mg Intramuscular Once [name], MD       paliperidone (INVEGA) 24 hr tablet 3 mg  3 mg Oral QHS [name], DO   3 mg at 11/02/22 2131   traZODone (DESYREL) tablet 50 mg  50 mg Oral QHS PRN [name], DO   50 mg at 11/02/22 2131    Lab Results: No results found for this or any previous visit (from the past 48 hour(s)).  Blood Alcohol level:  Lab Results  Component Value Date   ETH <10 08/17/2022   ETH <10 01/11/2021    Metabolic Disorder Labs: No results found for: "HGBA1C", "MPG" No results found for: "PROLACTIN" No results found for: "CHOL", "TRIG", "HDL", "CHOLHDL", "VLDL", "LDLCALC"    Musculoskeletal: Strength & Muscle Tone: within normal limits Gait & Station: normal Patient leans: N/A   Psychiatric Specialty Exam:   Presentation  General Appearance:  Casual; Neat   Eye Contact: Fair   Speech:  Garbled   Speech Volume: Normal   Handedness: Right     Mood and Affect  Mood: Goldfine"   Affect: Agitated    Thought Processes: Disorganized Probably at baseline. Improved since admission   Descriptions of Associations:Loose   Orientation:Partial   Thought Content:Denies SI/HI, delusional, at baseline   Hallucinations:Denies ,   Ideas of Reference:Paranoia, at baseline   Suicidal Thoughts:Denies SI Homicidal Thoughts:Denies HI   Sensorium  Memory: Immediate Fair; Remote Poor   Judgment: Fair, pt is Rx compliant   Insight: Limited     Executive Functions   Concentration: Poor   Attention Span: Short   Language: Fair     Psychomotor Activity  Psychomotor Activity:Variable, at times decreased, and at times increased   Assets  Assets: Communication Skills     Sleep  Sleep:Improved     Physical Exam: Physical Exam Vitals and nursing note reviewed.  Constitutional:      Appearance: Normal appearance. She is normal weight.  Neurological:     General: No focal deficit present.     Mental Status: She is alert.  Review of Systems  Constitutional: Negative.   HENT: Negative.    Eyes: Negative.   Respiratory: Negative.    Cardiovascular: Negative.   Gastrointestinal: Negative.   Genitourinary: Negative.   Musculoskeletal: Negative.   Skin: Negative.   Neurological: Negative.   Endo/Heme/Allergies: Negative.     Blood pressure 110/61, pulse 75, temperature 97.6 F (36.4 C), resp. rate 18, height 5\' 6"  (1.676 m), weight 64 kg, SpO2 99%. Body mass index is 22.76 kg/m.   Treatment Plan Summary: Daily contact with patient to assess and evaluate symptoms and progress in treatment, Medication management, and Plan continue current medications.  Invega Sustenna 156 mg IM given, 10/15/22 to be repeated every 4 weeks. Continue on Invega 3 mg Po daily    [name], MD

## 2022-11-03 NOTE — Group Note (Signed)
LCSW Group Therapy Note  Group Date: 11/02/2022 Start Time: 1310 End Time: 1345   Type of Therapy and Topic:  Group Therapy - Healthy vs Unhealthy Coping Skills  Participation Level:  Minimal   Description of Group The focus of this group was to determine what unhealthy coping techniques typically are used by group members and what healthy coping techniques would be helpful in coping with various problems. Patients were guided in becoming aware of the differences between healthy and unhealthy coping techniques. Patients were asked to identify 2-3 healthy coping skills they would like to learn to use more effectively.  Therapeutic Goals Patients learned that coping is what human beings do all day long to deal with various situations in their lives Patients defined and discussed healthy vs unhealthy coping techniques Patients identified their preferred coping techniques and identified whether these were healthy or unhealthy Patients determined 2-3 healthy coping skills they would like to become more familiar with and use more often. Patients provided support and ideas to each other   Summary of Patient Progress:  Patient attended group. During group, Betty Henderson shared that her favorite season is fall and that one was she copes with negative situations it through distractions by being around other people. Patient proved open to input from peers and feedback from CSW. Patient demonstrated Betty Henderson showed some insight into the subject matter, was respectful of peers, and participated throughout the entire session.   Therapeutic Modalities Cognitive Behavioral Therapy Motivational Interviewing  Azucena Kuba, LCSWA 11/03/2022  8:59 AM

## 2022-11-03 NOTE — Progress Notes (Signed)
Calm and cooperative. Limited interaction with peers and staff. Visible on the unit. Participant in group. Po med complaint. Trazodone 50 mg po given PRN as ordered for insomnia. No behavior issues noted. Q 15 min checks provided. Denies SI/HI/AVH. Plan of care continued.

## 2022-11-03 NOTE — Plan of Care (Signed)

## 2022-11-03 NOTE — Progress Notes (Signed)
Patient is a voluntary admission to [facility] for SAD, Bipolar on 08/28/22 and has been awaiting placement. She is less labile in her conduct, pleasant and cooperative when out of the room. Prefers to stay in her room but will come out to dayroom and lay on the couch. Denies S/I, H/I, AVH. Will continue to monitor.

## 2022-11-03 NOTE — Group Note (Signed)
Date:  11/03/2022 Time:  9:00 PM  Group Topic/Focus:  Building Self Esteem:   The Focus of this group is helping patients become aware of the effects of self-esteem on their lives, the things they and others do that enhance or undermine their self-esteem, seeing the relationship between their level of self-esteem and the choices they make and learning ways to enhance self-esteem. Coping With Mental Health Crisis:   The purpose of this group is to help patients identify strategies for coping with mental health crisis.  Group discusses possible causes of crisis and ways to manage them effectively. Developing a Wellness Toolbox:   The focus of this group is to help patients develop a "wellness toolbox" with skills and strategies to promote recovery upon discharge. Goals Group:   The focus of this group is to help patients establish daily goals to achieve during treatment and discuss how the patient can incorporate goal setting into their daily lives to aide in recovery. Healthy Communication:   The focus of this group is to discuss communication, barriers to communication, as well as healthy ways to communicate with others. Identifying Needs:   The focus of this group is to help patients identify their personal needs that have been historically problematic and identify healthy behaviors to address their needs. Making Healthy Choices:   The focus of this group is to help patients identify negative/unhealthy choices they were using prior to admission and identify positive/healthier coping strategies to replace them upon discharge. Self Care:   The focus of this group is to help patients understand the importance of self-care in order to improve or restore emotional, physical, spiritual, interpersonal, and financial health.    Participation Level:  Minimal  Participation Quality:  Appropriate  Affect:  Appropriate  Cognitive:  Appropriate  Insight: Good  Engagement in Group:  Engaged  Modes of  Intervention:  Discussion  Additional Comments:    [name] 11/03/2022, 9:00 PM

## 2022-11-03 NOTE — Plan of Care (Signed)
Calm and cooperative. Isolates except for meals/meds. Did not attend group. Remains stable. No changes. No behavior issues noted. Denies SI/HI/AV/H. No c/o pain/discomfort noted. Q 15 min checks maintained for safety.   Problem: Nutrition: Goal: Adequate nutrition will be maintained Outcome: Progressing   Problem: Coping: Goal: Level of anxiety will decrease Outcome: Progressing   Problem: Elimination: Goal: Will not experience complications related to bowel motility Outcome: Progressing Goal: Will not experience complications related to urinary retention Outcome: Progressing   Problem: Pain Managment: Goal: General experience of comfort will improve Outcome: Progressing   Problem: Safety: Goal: Ability to remain free from injury will improve Outcome: Progressing   Problem: Skin Integrity: Goal: Risk for impaired skin integrity will decrease Outcome: Progressing

## 2022-11-03 NOTE — Plan of Care (Signed)
  Problem: Education: Goal: Knowledge of General Education information will improve Description: Including pain rating scale, medication(s)/side effects and non-pharmacologic comfort measures Outcome: Progressing   Problem: Health Behavior/Discharge Planning: Goal: Ability to manage health-related needs will improve Outcome: Progressing   Problem: Clinical Measurements: Goal: Ability to maintain clinical measurements within normal limits will improve Outcome: Progressing Goal: Diagnostic test results will improve Outcome: Progressing   Problem: Nutrition: Goal: Adequate nutrition will be maintained Outcome: Progressing   Problem: Coping: Goal: Level of anxiety will decrease Outcome: Progressing   Problem: Elimination: Goal: Will not experience complications related to bowel motility Outcome: Progressing Goal: Will not experience complications related to urinary retention Outcome: Progressing   Problem: Pain Managment: Goal: General experience of comfort will improve Outcome: Progressing   Problem: Safety: Goal: Ability to remain free from injury will improve Outcome: Progressing   Problem: Skin Integrity: Goal: Risk for impaired skin integrity will decrease Outcome: Progressing

## 2022-11-04 DIAGNOSIS — F250 Schizoaffective disorder, bipolar type: Secondary | ICD-10-CM | POA: Diagnosis not present

## 2022-11-04 NOTE — Progress Notes (Addendum)
[facility] MD Progress Note  11/04/2022 Betty Henderson  MRN:  [id number]  Subjective: Case discussed with RN and social worker, chart reviewed, patient seen today during rounds.   Per SW, caseworker at [name] states that the court hearing should be scheduled  within the next 14 days.  After the court hearing pt will be assigned a guardian through the state. No new acute events overnight.  Patient is awaiting placement.  Patient denies auditory or visual hallucinations.  Patient denies any thoughts of harming herself or others.  Social work is awaiting to hear from DSS on placement.   Principal Problem: Schizoaffective disorder, bipolar type (HCC) Diagnosis: Principal Problem:   Schizoaffective disorder, bipolar type (HCC)   Past Psychiatric History: Schizoaffective disorder, bipolar type.  Past Medical History:  Past Medical History:  Diagnosis Date   Anemia    Arthritis    Chronic pain    Drug-seeking behavior    Malingering    Osteopetrosis    Psychosis (HCC)    Schizoaffective disorder, bipolar type (HCC)     Past Surgical History:  Procedure Laterality Date   MOUTH SURGERY     TUBAL LIGATION     Family History:  Family History  Family history unknown: Yes   Family Psychiatric  History: Unremarkable Social History:  Social History   Substance and Sexual Activity  Alcohol Use Not Currently   Comment: 1 cocktail 3 weeks ago     Social History   Substance and Sexual Activity  Drug Use Not Currently   Types: Cocaine   Comment: states "it's legal though"    Social History   Socioeconomic History   Marital status: Single    Spouse name: Not on file   Number of children: Not on file   Years of education: Not on file   Highest education level: Not on file  Occupational History   Not on file  Tobacco Use   Smoking status: Every Day    Current packs/day: 0.50    Types: Cigarettes   Smokeless tobacco: Never  Vaping Use   Vaping  status: Never Used  Substance and Sexual Activity   Alcohol use: Not Currently    Comment: 1 cocktail 3 weeks ago   Drug use: Not Currently    Types: Cocaine    Comment: states "it's legal though"   Sexual activity: Never  Other Topics Concern   Not on file  Social History Narrative   Not on file   Social Determinants of Health   Financial Resource Strain: Not on file  Food Insecurity: Food Insecurity Present (08/22/2022)   Hunger Vital Sign    Worried About Running Out of Food in the Last Year: Sometimes true    Ran Out of Food in the Last Year: Sometimes true  Transportation Needs: Patient Unable To Answer (08/22/2022)   PRAPARE - Transportation    Lack of Transportation (Medical): Patient unable to answer    Lack of Transportation (Non-Medical): Patient unable to answer  Recent Concern: Transportation Needs - Unmet Transportation Needs (06/13/2022)   Received from [facility], [facility]   [facility] - Transportation    Lack of Transportation (Medical): Not on file    Lack of Transportation (Non-Medical): Yes  Physical Activity: Not on file  Stress: Not on file  Social Connections: Unknown (08/06/2021)   Received from [facility], [facility]   Social Network    Social Network: Not on file   Additional Social History:  Sleep: Good  Appetite:  Good  Current Medications: Current Facility-Administered Medications  Medication Dose Route Frequency Provider Last Rate Last Admin   acetaminophen (TYLENOL) tablet 650 mg  650 mg Oral Q6H PRN [name], DO   650 mg at 11/01/22 2104   alum & mag hydroxide-simeth (MAALOX/MYLANTA) 200-200-20 MG/5ML suspension 30 mL  30 mL Oral Q4H PRN [name], DO   30 mL at 09/20/22 1142   diphenhydrAMINE (BENADRYL) capsule 50 mg  50 mg Oral Q6H PRN [name], DO   50 mg at 09/29/22 2254   Or   diphenhydrAMINE (BENADRYL) injection 50 mg  50 mg Intramuscular Q6H PRN  [name], DO       haloperidol (HALDOL) tablet 5 mg  5 mg Oral Q6H PRN [name], DO   5 mg at 09/28/22 2149   Or   haloperidol lactate (HALDOL) injection 5 mg  5 mg Intramuscular Q6H PRN [name], DO       ibuprofen (ADVIL) tablet 600 mg  600 mg Oral Q6H PRN [name], [name], MD   600 mg at 11/04/22 0916   LORazepam (ATIVAN) tablet 1 mg  1 mg Oral TID PRN [name], DO   1 mg at 09/27/22 0025   magnesium hydroxide (MILK OF MAGNESIA) suspension 30 mL  30 mL Oral Daily PRN [name], DO       [START ON 11/13/2022] paliperidone (INVEGA SUSTENNA) injection 156 mg  156 mg Intramuscular Once [name], MD       paliperidone (INVEGA) 24 hr tablet 3 mg  3 mg Oral QHS [name], DO   3 mg at 11/03/22 2135   traZODone (DESYREL) tablet 50 mg  50 mg Oral QHS PRN [name], DO   50 mg at 11/03/22 2135    Lab Results: No results found for this or any previous visit (from the past 48 hour(s)).  Blood Alcohol level:  Lab Results  Component Value Date   ETH <10 08/17/2022   ETH <10 01/11/2021    Metabolic Disorder Labs: No results found for: "HGBA1C", "MPG" No results found for: "PROLACTIN" No results found for: "CHOL", "TRIG", "HDL", "CHOLHDL", "VLDL", "LDLCALC"    Musculoskeletal: Strength & Muscle Tone: within normal limits Gait & Station: normal Patient leans: N/A   Psychiatric Specialty Exam:   Presentation  General Appearance:  Casual; Neat   Eye Contact: Fair   Speech:  Less Garbled   Speech Volume: Normal   Handedness: Right     Mood and Affect  Mood: Fine   Affect: Stable    Thought Processes: Disorganized Probably at baseline. Improved since admission   Descriptions of Associations:Loose   Orientation:Partial   Thought Content:Denies SI/HI, delusional, at baseline   Hallucinations:Denies ,   Ideas of Reference:Paranoia, at baseline   Suicidal  Thoughts:Denies SI Homicidal Thoughts:Denies HI   Sensorium  Memory: Immediate Fair; Remote Poor   Judgment: Fair, pt is Rx compliant   Insight: Limited     Executive Functions  Concentration: Poor   Attention Span: Short   Language: Fair     Psychomotor Activity  Psychomotor Activity:Variable, at times decreased, and at times increased   Assets  Assets: Communication Skills     Sleep  Sleep:Improved     Physical Exam: Physical Exam Vitals and nursing note reviewed.  Constitutional:      Appearance: Normal appearance. She is normal weight.  Neurological:  General: No focal deficit present.     Mental Status: She is alert.      Review of Systems  Constitutional: Negative.   HENT: Negative.    Eyes: Negative.   Respiratory: Negative.    Cardiovascular: Negative.   Gastrointestinal: Negative.   Genitourinary: Negative.   Musculoskeletal: Negative.   Skin: Negative.   Neurological: Negative.   Endo/Heme/Allergies: Negative.     Blood pressure 127/75, pulse 71, temperature 97.9 F (36.6 C), resp. rate 17, height 5\' 6"  (1.676 m), weight 64 kg, SpO2 100%. Body mass index is 22.76 kg/m.   Treatment Plan Summary: Daily contact with patient to assess and evaluate symptoms and progress in treatment, Medication management, and Plan continue current medications.  Invega Sustenna 156 mg IM given, 10/15/22 to be repeated every 4 weeks. Continue on Invega 3 mg Po daily    [name], MD

## 2022-11-04 NOTE — Plan of Care (Signed)
  Problem: Nutrition: Goal: Adequate nutrition will be maintained Outcome: Progressing   Problem: Coping: Goal: Level of anxiety will decrease Outcome: Progressing   

## 2022-11-04 NOTE — B[facility] Counselor (Signed)
CSW contacted caseworker at [facility] [place] at [phone].    Shonte states that the court hearing should be scheduled  within the next 14 days.   Shonte states until the court hearing she will remain pt's casework at [facility].   Shonte states after the court hearing pt will be assigned a guardian through the state.   CSW to follow-up with supervisors during difficult to place meeting about progress this upcoming Wednesday.   Reynaldo Minium, MSW, [place] 11/04/2022 2:08 PM

## 2022-11-04 NOTE — Plan of Care (Signed)
D: Pt alert and oriented. Pt denies experiencing any anxiety or depression at this time. Pt reports experiencing 10/10 right arm pain at this time, prn medication given. Pt denies experiencing any SI/HI, or AVH at this time.   A: Support and encouragement provided. Frequent verbal contact made. Routine safety checks conducted q15 minutes.   R: Pt verbally contracts for safety at this time. Pt compliant with medications and treatment plan. Pt interacts well with others on the unit. Pt remains safe at this time. Plan of care ongoing.  Problem: Nutrition: Goal: Adequate nutrition will be maintained Outcome: Progressing   Problem: Coping: Goal: Level of anxiety will decrease Outcome: Progressing

## 2022-11-04 NOTE — Group Note (Signed)
  Date:  11/04/2022 Time:  11:19 PM  Group Topic/Focus:  Building Self Esteem:   The Focus of this group is helping patients become aware of the effects of self-esteem on their lives, the things they and others do that enhance or undermine their self-esteem, seeing the relationship between their level of self-esteem and the choices they make and learning ways to enhance self-esteem. Coping With Mental Health Crisis:   The purpose of this group is to help patients identify strategies for coping with mental health crisis.  Group discusses possible causes of crisis and ways to manage them effectively. Crisis Planning:   The purpose of this group is to help patients create a crisis plan for use upon discharge or in the future, as needed. Developing a Wellness Toolbox:   The focus of this group is to help patients develop a "wellness toolbox" with skills and strategies to promote recovery upon discharge. Dimensions of Wellness:   The focus of this group is to introduce the topic of wellness and discuss the role each dimension of wellness plays in total health. Healthy Communication:   The focus of this group is to discuss communication, barriers to communication, as well as healthy ways to communicate with others. Making Healthy Choices:   The focus of this group is to help patients identify negative/unhealthy choices they were using prior to admission and identify positive/healthier coping strategies to replace them upon discharge. Managing Feelings:   The focus of this group is to identify what feelings patients have difficulty handling and develop a plan to handle them in a healthier way upon discharge. Self Care:   The focus of this group is to help patients understand the importance of self-care in order to improve or restore emotional, physical, spiritual, interpersonal, and financial health.    Participation Level:  Did Not Attend  Participation Quality:   did not attend  Affect:   did not  attend  Cognitive:   did not attend  Insight: None  Engagement in Group:  None  Modes of Intervention:   did not attend  Additional Comments:    [name] 11/04/2022, 11:19 PM

## 2022-11-04 NOTE — Group Note (Signed)
Recreation Therapy Group Note   Group Topic:Relaxation  Group Date: 11/04/2022 Start Time: 1400 End Time: 1440 Facilitators: Rosina Lowenstein, LRT, CTRS Location: Day room   Group Description: Chair Yoga. LRT and patients discussed the benefits of yoga and how it differs from strength exercises. LRT educated patients on the mental and physical benefits of yoga and deep breathing and how it can be used as a Associate Professor. LRT and patients followed along to a guided yoga session on the television that focused on all parts of the body, as well as deep breathing. Pt encouraged to stop movement at any time if they feel discomfort or pain.   Goal Area(s) Addressed: Patient will practice using relaxation technique. Patient will identify a new coping skill.  Patient will follow multistep directions to reduce anxiety and stress.   Affect/Mood: N/A   Participation Level: Did not attend    Clinical Observations/Individualized Feedback: Ronald did not attend group.  Plan: Continue to engage patient in RT group sessions 2-3x/week.   Rosina Lowenstein, LRT, CTRS 11/04/2022 3:06 PM

## 2022-11-04 NOTE — Progress Notes (Signed)
   11/04/22 2128  Psych Admission Type (Psych Patients Only)  Admission Status Voluntary  Psychosocial Assessment  Patient Complaints None  Eye Contact Brief  Facial Expression Flat  Affect Appropriate to circumstance  Speech Logical/coherent  Interaction Minimal  Motor Activity Slow;Shuffling  Appearance/Hygiene Layered clothes  Behavior Characteristics Cooperative  Mood Pleasant  Thought Process  Coherency WDL  Content WDL  Delusions None reported or observed  Perception WDL  Hallucination Auditory  Judgment Impaired  Confusion Mild  Danger to Self  Current suicidal ideation? Denies

## 2022-11-05 DIAGNOSIS — F250 Schizoaffective disorder, bipolar type: Secondary | ICD-10-CM | POA: Diagnosis not present

## 2022-11-05 NOTE — Group Note (Signed)
LCSW Group Therapy Note   Group Date: 11/05/2022 Start Time: 1315 End Time: 1400   Type of Therapy and Topic:  Group Therapy: Challenging Core Beliefs  Participation Level:  Active  Description of Group:  Patients were educated about core beliefs and asked to identify one harmful core belief that they have. Patients were asked to explore from where those beliefs originate. Patients were asked to discuss how those beliefs make them feel and the resulting behaviors of those beliefs. They were then be asked if those beliefs are true and, if so, what evidence they have to support them. Lastly, group members were challenged to replace those negative core beliefs with helpful beliefs.   Therapeutic Goals:   1. Patient will identify harmful core beliefs and explore the origins of such beliefs. 2. Patient will identify feelings and behaviors that result from those core beliefs. 3. Patient will discuss whether such beliefs are true. 4.  Patient will replace harmful core beliefs with helpful ones.  Summary of Patient Progress:  Betty Henderson actively engaged in processing and exploring how core beliefs are formed and how they impact thoughts, feelings, and behaviors. Patient proved open to input from peers and feedback from CSW. Patient demonstrated good insight into the subject matter, was respectful and supportive of peers, and participated throughout the entire session.  Therapeutic Modalities: Cognitive Behavioral Therapy; Solution-Focused Therapy   Elza Rafter, [place] 11/05/2022  2:46 PM

## 2022-11-05 NOTE — Group Note (Signed)
Date:  11/05/2022 Time:  9:39 PM  Group Topic/Focus:  Building Self Esteem:   The Focus of this group is helping patients become aware of the effects of self-esteem on their lives, the things they and others do that enhance or undermine their self-esteem, seeing the relationship between their level of self-esteem and the choices they make and learning ways to enhance self-esteem. Coping With Mental Health Crisis:   The purpose of this group is to help patients identify strategies for coping with mental health crisis.  Group discusses possible causes of crisis and ways to manage them effectively. Conflict Resolution:   The focus of this group is to discuss the conflict resolution process and how it may be used upon discharge. Crisis Planning:   The purpose of this group is to help patients create a crisis plan for use upon discharge or in the future, as needed. Developing a Wellness Toolbox:   The focus of this group is to help patients develop a "wellness toolbox" with skills and strategies to promote recovery upon discharge. Making Healthy Choices:   The focus of this group is to help patients identify negative/unhealthy choices they were using prior to admission and identify positive/healthier coping strategies to replace them upon discharge. Self Care:   The focus of this group is to help patients understand the importance of self-care in order to improve or restore emotional, physical, spiritual, interpersonal, and financial health.    Participation Level:  Did Not Attend  Participation Quality:   Did Not Attend  Affect:   Did Not Attend  Cognitive:   Did Not Attend  Insight: None  Engagement in Group:  None  Modes of Intervention:   Did Not Attend  Additional Comments:    [name] 11/05/2022, 9:39 PM

## 2022-11-05 NOTE — Plan of Care (Signed)
  Problem: Clinical Measurements: Goal: Ability to maintain clinical measurements within normal limits will improve Outcome: Progressing   Problem: Nutrition: Goal: Adequate nutrition will be maintained Outcome: Progressing   

## 2022-11-05 NOTE — Group Note (Signed)
Recreation Therapy Group Note   Group Topic:Goal Setting  Group Date: 11/05/2022 Start Time: 1400 End Time: 1455 Facilitators: Rosina Lowenstein, LRT, CTRS Location:  Day Room  Group Description: Vision Board. Patients were given many different magazines, a glue stick, markers, and a piece of cardstock paper. LRT and pts discussed the importance of having goals in life. LRT and pts discussed the difference between short-term and long-term goals, as well as what a SMART goal is. LRT encouraged pts to create a vision board, with images they picked and then cut out with safety scissors from the magazine, for themselves, that capture their short and long-term goals. LRT encouraged pts to show and explain their vision board to the group. LRT offered to laminate vision board once dry and complete.   Goal Area(s) Addressed:  Patient will gain knowledge of short vs. long term goals.  Patient will identify goals for themselves. Patient will practice setting SMART goals. Patient will verbalize their goals to LRT and peers.   Affect/Mood: N/A   Participation Level: Did not attend    Clinical Observations/Individualized Feedback: Yocelyne did not attend group.  Plan: Continue to engage patient in RT group sessions 2-3x/week.   Rosina Lowenstein, LRT, CTRS 11/05/2022 3:36 PM

## 2022-11-05 NOTE — Progress Notes (Signed)
   11/05/22 2211  Psych Admission Type (Psych Patients Only)  Admission Status Voluntary  Psychosocial Assessment  Patient Complaints None  Eye Contact Brief  Facial Expression Flat  Affect Flat  Speech Logical/coherent  Interaction Minimal  Motor Activity Slow  Appearance/Hygiene Layered clothes  Behavior Characteristics Cooperative  Mood Pleasant  Thought Process  Coherency WDL  Content WDL  Delusions None reported or observed  Perception WDL  Hallucination Auditory  Judgment Impaired  Confusion Mild  Danger to Self  Current suicidal ideation? Denies

## 2022-11-05 NOTE — Progress Notes (Addendum)
[facility] MD Progress Note  8/132/024 Latessa Perovich  MRN:  [id number]  Subjective: Case discussed with RN and social worker, chart reviewed, patient seen today during rounds.  . No new acute events overnight.  Patient is awaiting placement.  Patient denies auditory or visual hallucinations.  Patient denies any thoughts of harming herself or others.  Patient has received ibuprofen as needed for backache.  No behavioral issues reported by the staff.  Per SW, caseworker at [facility] [name] states that the court hearing should be scheduled  within the next 14 days.  After the court hearing pt will be assigned a guardian through the state.  Principal Problem: Schizoaffective disorder, bipolar type (HCC) Diagnosis: Principal Problem:   Schizoaffective disorder, bipolar type (HCC)   Past Psychiatric History: Schizoaffective disorder, bipolar type.  Past Medical History:  Past Medical History:  Diagnosis Date   Anemia    Arthritis    Chronic pain    Drug-seeking behavior    Malingering    Osteopetrosis    Psychosis (HCC)    Schizoaffective disorder, bipolar type (HCC)     Past Surgical History:  Procedure Laterality Date   MOUTH SURGERY     TUBAL LIGATION     Family History:  Family History  Family history unknown: Yes   Family Psychiatric  History: Unremarkable Social History:  Social History   Substance and Sexual Activity  Alcohol Use Not Currently   Comment: 1 cocktail 3 weeks ago     Social History   Substance and Sexual Activity  Drug Use Not Currently   Types: Cocaine   Comment: states "it's legal though"    Social History   Socioeconomic History   Marital status: Single    Spouse name: Not on file   Number of children: Not on file   Years of education: Not on file   Highest education level: Not on file  Occupational History   Not on file  Tobacco Use   Smoking status: Every Day    Current packs/day: 0.50    Types: Cigarettes    Smokeless tobacco: Never  Vaping Use   Vaping status: Never Used  Substance and Sexual Activity   Alcohol use: Not Currently    Comment: 1 cocktail 3 weeks ago   Drug use: Not Currently    Types: Cocaine    Comment: states "it's legal though"   Sexual activity: Never  Other Topics Concern   Not on file  Social History Narrative   Not on file   Social Determinants of Health   Financial Resource Strain: Not on file  Food Insecurity: Food Insecurity Present (08/22/2022)   Hunger Vital Sign    Worried About Running Out of Food in the Last Year: Sometimes true    Ran Out of Food in the Last Year: Sometimes true  Transportation Needs: Patient Unable To Answer (08/22/2022)   PRAPARE - Transportation    Lack of Transportation (Medical): Patient unable to answer    Lack of Transportation (Non-Medical): Patient unable to answer  Recent Concern: Transportation Needs - Unmet Transportation Needs (06/13/2022)   Received from [facility], [facility]   [facility] - Transportation    Lack of Transportation (Medical): Not on file    Lack of Transportation (Non-Medical): Yes  Physical Activity: Not on file  Stress: Not on file  Social Connections: Unknown (08/06/2021)   Received from [facility], [facility]   Social Network    Social Network: Not on file  Additional Social History:                         Sleep: Good  Appetite:  Good  Current Medications: Current Facility-Administered Medications  Medication Dose Route Frequency Provider Last Rate Last Admin   acetaminophen (TYLENOL) tablet 650 mg  650 mg Oral Q6H PRN [name], DO   650 mg at 11/01/22 2104   alum & mag hydroxide-simeth (MAALOX/MYLANTA) 200-200-20 MG/5ML suspension 30 mL  30 mL Oral Q4H PRN [name], DO   30 mL at 09/20/22 1142   diphenhydrAMINE (BENADRYL) capsule 50 mg  50 mg Oral Q6H PRN [name], DO   50 mg at 09/29/22 2254   Or   diphenhydrAMINE  (BENADRYL) injection 50 mg  50 mg Intramuscular Q6H PRN [name], DO       haloperidol (HALDOL) tablet 5 mg  5 mg Oral Q6H PRN [name], DO   5 mg at 09/28/22 2149   Or   haloperidol lactate (HALDOL) injection 5 mg  5 mg Intramuscular Q6H PRN [name], DO       ibuprofen (ADVIL) tablet 600 mg  600 mg Oral Q6H PRN [name], [name], MD   600 mg at 11/05/22 1237   LORazepam (ATIVAN) tablet 1 mg  1 mg Oral TID PRN [name], DO   1 mg at 09/27/22 0025   magnesium hydroxide (MILK OF MAGNESIA) suspension 30 mL  30 mL Oral Daily PRN [name], DO       [START ON 11/13/2022] paliperidone (INVEGA SUSTENNA) injection 156 mg  156 mg Intramuscular Once [name], MD       paliperidone (INVEGA) 24 hr tablet 3 mg  3 mg Oral QHS [name], DO   3 mg at 11/04/22 2108   traZODone (DESYREL) tablet 50 mg  50 mg Oral QHS PRN [name], DO   50 mg at 11/04/22 2108    Lab Results: No results found for this or any previous visit (from the past 48 hour(s)).  Blood Alcohol level:  Lab Results  Component Value Date   ETH <10 08/17/2022   ETH <10 01/11/2021    Metabolic Disorder Labs: No results found for: "HGBA1C", "MPG" No results found for: "PROLACTIN" No results found for: "CHOL", "TRIG", "HDL", "CHOLHDL", "VLDL", "LDLCALC"    Musculoskeletal: Strength & Muscle Tone: within normal limits Gait & Station: normal Patient leans: N/A   Psychiatric Specialty Exam:   Presentation  General Appearance:  Casual; Neat   Eye Contact: Fair   Speech:  Less Garbled   Speech Volume: Normal   Handedness: Right     Mood and Affect  Mood: Fine   Affect: Stable    Thought Processes: Probably at baseline. Improved since admission   Descriptions of Associations:Intact   Orientation:Partial   Thought Content:Denies SI/HI, delusional, at baseline   Hallucinations:Denies ,   Ideas of  Reference:Paranoia, at baseline   Suicidal Thoughts:Denies SI Homicidal Thoughts:Denies HI   Sensorium  Memory: Immediate Fair; Remote Poor   Judgment: Fair, pt is Rx compliant   Insight: Limited     Executive Functions  Concentration: Improved   Attention Span: Improved   Language: Fair     Psychomotor Activity  Psychomotor Activity:Variable, at times decreased, and at times increased   Assets  Assets: Communication Skills     Sleep  Sleep:Improved     Physical Exam: Physical  Exam Vitals and nursing note reviewed.  Constitutional:      Appearance: Normal appearance. She is normal weight.  Neurological:     General: No focal deficit present.     Mental Status: She is alert.      Review of Systems  Constitutional: Negative.   HENT: Negative.    Eyes: Negative.   Respiratory: Negative.    Cardiovascular: Negative.   Gastrointestinal: Negative.   Genitourinary: Negative.   Musculoskeletal: Negative.   Skin: Negative.   Neurological: Negative.   Endo/Heme/Allergies: Negative.     Blood pressure 139/82, pulse 72, temperature (!) 97.5 F (36.4 C), resp. rate 16, height 5\' 6"  (1.676 m), weight 64 kg, SpO2 95%. Body mass index is 22.76 kg/m.   Treatment Plan Summary: Daily contact with patient to assess and evaluate symptoms and progress in treatment, Medication management, and Plan continue current medications.  Invega Sustenna 156 mg IM given, 10/15/22 to be repeated every 4 weeks. Continue on Invega 3 mg Po daily    [name], MD

## 2022-11-06 DIAGNOSIS — F250 Schizoaffective disorder, bipolar type: Secondary | ICD-10-CM | POA: Diagnosis not present

## 2022-11-06 NOTE — Plan of Care (Signed)
  Problem: Nutrition: Goal: Adequate nutrition will be maintained Outcome: Progressing   

## 2022-11-06 NOTE — Progress Notes (Signed)
   11/06/22 2037  Psych Admission Type (Psych Patients Only)  Admission Status Voluntary  Psychosocial Assessment  Patient Complaints None  Eye Contact Brief  Facial Expression Flat  Affect Flat  Speech Slurred  Interaction Minimal;No initiation  Motor Activity Shuffling  Appearance/Hygiene Layered clothes  Behavior Characteristics Calm  Mood Pleasant  Thought Process  Coherency WDL  Content Preoccupation  Delusions None reported or observed  Perception WDL  Hallucination Auditory  Judgment Impaired  Confusion Mild  Danger to Self  Current suicidal ideation? Denies

## 2022-11-06 NOTE — Plan of Care (Signed)
  Problem: Education: Goal: Knowledge of General Education information will improve Description: Including pain rating scale, medication(s)/side effects and non-pharmacologic comfort measures Outcome: Progressing   Problem: Health Behavior/Discharge Planning: Goal: Ability to manage health-related needs will improve Outcome: Progressing   Problem: Clinical Measurements: Goal: Ability to maintain clinical measurements within normal limits will improve Outcome: Progressing Goal: Diagnostic test results will improve Outcome: Progressing   Problem: Nutrition: Goal: Adequate nutrition will be maintained Outcome: Progressing   Problem: Coping: Goal: Level of anxiety will decrease Outcome: Progressing   Problem: Elimination: Goal: Will not experience complications related to bowel motility Outcome: Progressing Goal: Will not experience complications related to urinary retention Outcome: Progressing   Problem: Skin Integrity: Goal: Risk for impaired skin integrity will decrease Outcome: Progressing   Problem: Safety: Goal: Ability to remain free from injury will improve Outcome: Progressing

## 2022-11-06 NOTE — Progress Notes (Addendum)
[facility] MD Progress Note  11/06/2022 Betty Henderson  MRN:  [id number]  Subjective: Case discussed with RN and social worker, chart reviewed, patient seen today during rounds.   No new acute events overnight.  Patient was pleasant and cooperative with assessment today.  Patient is awaiting placement.  Patient denies auditory or visual hallucinations.  Patient denies any thoughts of harming herself or others.No behavioral issues reported by the staff.  Per SW, caseworker at [facility] [name] states that the court hearing should be scheduled  within the next 14 days.  After the court hearing pt will be assigned a guardian through the state.  Principal Problem: Schizoaffective disorder, bipolar type (HCC) Diagnosis: Principal Problem:   Schizoaffective disorder, bipolar type (HCC)   Past Psychiatric History: Schizoaffective disorder, bipolar type.  Past Medical History:  Past Medical History:  Diagnosis Date   Anemia    Arthritis    Chronic pain    Drug-seeking behavior    Malingering    Osteopetrosis    Psychosis (HCC)    Schizoaffective disorder, bipolar type (HCC)     Past Surgical History:  Procedure Laterality Date   MOUTH SURGERY     TUBAL LIGATION     Family History:  Family History  Family history unknown: Yes   Family Psychiatric  History: Unremarkable Social History:  Social History   Substance and Sexual Activity  Alcohol Use Not Currently   Comment: 1 cocktail 3 weeks ago     Social History   Substance and Sexual Activity  Drug Use Not Currently   Types: Cocaine   Comment: states "it's legal though"    Social History   Socioeconomic History   Marital status: Single    Spouse name: Not on file   Number of children: Not on file   Years of education: Not on file   Highest education level: Not on file  Occupational History   Not on file  Tobacco Use   Smoking status: Every Day    Current packs/day: 0.50    Types: Cigarettes    Smokeless tobacco: Never  Vaping Use   Vaping status: Never Used  Substance and Sexual Activity   Alcohol use: Not Currently    Comment: 1 cocktail 3 weeks ago   Drug use: Not Currently    Types: Cocaine    Comment: states "it's legal though"   Sexual activity: Never  Other Topics Concern   Not on file  Social History Narrative   Not on file   Social Determinants of Health   Financial Resource Strain: Not on file  Food Insecurity: Food Insecurity Present (08/22/2022)   Hunger Vital Sign    Worried About Running Out of Food in the Last Year: Sometimes true    Ran Out of Food in the Last Year: Sometimes true  Transportation Needs: Patient Unable To Answer (08/22/2022)   PRAPARE - Transportation    Lack of Transportation (Medical): Patient unable to answer    Lack of Transportation (Non-Medical): Patient unable to answer  Recent Concern: Transportation Needs - Unmet Transportation Needs (06/13/2022)   Received from [facility], [facility]   [facility] - Transportation    Lack of Transportation (Medical): Not on file    Lack of Transportation (Non-Medical): Yes  Physical Activity: Not on file  Stress: Not on file  Social Connections: Unknown (08/06/2021)   Received from [facility], [facility]   Social Network    Social Network: Not on file  Additional Social History:                         Sleep: Good  Appetite:  Good  Current Medications: Current Facility-Administered Medications  Medication Dose Route Frequency Provider Last Rate Last Admin   acetaminophen (TYLENOL) tablet 650 mg  650 mg Oral Q6H PRN [name], DO   650 mg at 11/01/22 2104   alum & mag hydroxide-simeth (MAALOX/MYLANTA) 200-200-20 MG/5ML suspension 30 mL  30 mL Oral Q4H PRN [name], DO   30 mL at 09/20/22 1142   diphenhydrAMINE (BENADRYL) capsule 50 mg  50 mg Oral Q6H PRN [name], DO   50 mg at 09/29/22 2254   Or   diphenhydrAMINE  (BENADRYL) injection 50 mg  50 mg Intramuscular Q6H PRN [name], DO       haloperidol (HALDOL) tablet 5 mg  5 mg Oral Q6H PRN [name], DO   5 mg at 09/28/22 2149   Or   haloperidol lactate (HALDOL) injection 5 mg  5 mg Intramuscular Q6H PRN [name], DO       ibuprofen (ADVIL) tablet 600 mg  600 mg Oral Q6H PRN [name], [name], MD   600 mg at 11/05/22 1237   LORazepam (ATIVAN) tablet 1 mg  1 mg Oral TID PRN [name], DO   1 mg at 09/27/22 0025   magnesium hydroxide (MILK OF MAGNESIA) suspension 30 mL  30 mL Oral Daily PRN [name], DO       [START ON 11/13/2022] paliperidone (INVEGA SUSTENNA) injection 156 mg  156 mg Intramuscular Once [name], MD       paliperidone (INVEGA) 24 hr tablet 3 mg  3 mg Oral QHS [name], DO   3 mg at 11/05/22 2119   traZODone (DESYREL) tablet 50 mg  50 mg Oral QHS PRN [name], DO   50 mg at 11/05/22 2119    Lab Results: No results found for this or any previous visit (from the past 48 hour(s)).  Blood Alcohol level:  Lab Results  Component Value Date   ETH <10 08/17/2022   ETH <10 01/11/2021    Metabolic Disorder Labs: No results found for: "HGBA1C", "MPG" No results found for: "PROLACTIN" No results found for: "CHOL", "TRIG", "HDL", "CHOLHDL", "VLDL", "LDLCALC"    Musculoskeletal: Strength & Muscle Tone: within normal limits Gait & Station: normal Patient leans: N/A   Psychiatric Specialty Exam:   Presentation  General Appearance:  Casual; Neat   Eye Contact: Fair   Speech:  Less Garbled   Speech Volume: Normal   Handedness: Right     Mood and Affect  Mood: Fine   Affect: Stable    Thought Processes: Probably at baseline. Improved since admission   Descriptions of Associations:Intact   Orientation:Partial   Thought Content:Denies SI/HI, delusional, at baseline   Hallucinations:Denies ,   Ideas of  Reference:Paranoia, at baseline   Suicidal Thoughts:Denies SI Homicidal Thoughts:Denies HI   Sensorium  Memory: Immediate Fair; Remote Poor   Judgment: Fair, pt is Rx compliant   Insight: Limited     Executive Functions  Concentration: Improved   Attention Span: Improved   Language: Fair     Psychomotor Activity  Psychomotor Activity:Normal   Assets  Assets: Communication Skills     Sleep  Sleep:Improved     Physical Exam: Physical Exam Vitals and nursing note reviewed.  Constitutional:      Appearance: Normal appearance. She is normal weight.  Neurological:     General: No focal deficit present.     Mental Status: She is alert.      Review of Systems  Constitutional: Negative.   HENT: Negative.    Eyes: Negative.   Respiratory: Negative.    Cardiovascular: Negative.   Gastrointestinal: Negative.   Genitourinary: Negative.   Musculoskeletal: Negative.   Skin: Negative.   Neurological: Negative.   Endo/Heme/Allergies: Negative.     Blood pressure 116/67, pulse 72, temperature (!) 96.9 F (36.1 C), resp. rate 16, height 5\' 6"  (1.676 m), weight 64 kg, SpO2 99%. Body mass index is 22.76 kg/m.   Treatment Plan Summary: Daily contact with patient to assess and evaluate symptoms and progress in treatment, Medication management, and Plan continue current medications.  Invega Sustenna 156 mg IM given, 10/15/22 to be repeated every 4 weeks. Continue on Invega 3 mg Po daily    [name], MD

## 2022-11-06 NOTE — Progress Notes (Signed)
   11/06/22 0746  Psych Admission Type (Psych Patients Only)  Admission Status Voluntary  Psychosocial Assessment  Patient Complaints None  Eye Contact Poor  Facial Expression Flat  Affect Sullen;Flat  Speech Slurred  Interaction Assertive  Motor Activity Shuffling  Appearance/Hygiene Bizarre;Layered clothes  Behavior Characteristics Calm  Mood Sullen  Thought Chartered certified accountant of ideas  Content Preoccupation  Delusions None reported or observed  Perception WDL  Hallucination Auditory;Visual  Judgment Impaired  Confusion Mild  Danger to Self  Current suicidal ideation? Denies  Danger to Others  Danger to Others None reported or observed

## 2022-11-06 NOTE — Group Note (Signed)
Recreation Therapy Group Note   Group Topic:Self-Esteem  Group Date: 11/06/2022 Start Time: 1400 End Time: 1500 Facilitators: Rosina Lowenstein, LRT, CTRS Location:  Day room  Group Description: Positive Focus. Patients are given a handout that has 9 different boxes on it. Each box has a different prompt on it that requires you to identify and add something positive about themselves. LRT encourages pts to share at least two of their boxes to the group. LRT and pts discuss the importance of "thinking positive", self-esteem and how they can apply it to their everyday life post-discharge. After completing worksheet, patients played Positive Affirmation Bingo and won stress balls as prizes.   Goal Area(s) Addressed: Patient will identify positive qualities about themselves. Patient will identify definition of "self-esteem". Patients will identify the difference between positive and negative self-esteem.  Patient will recite positive qualities and affirmations aloud to the group.   Affect/Mood: Appropriate   Participation Level: Active and Engaged   Participation Quality: Independent   Behavior: Appropriate, Calm, and Cooperative   Speech/Thought Process: Coherent   Insight: Good   Judgement: Good   Modes of Intervention: Activity and Worksheet   Patient Response to Interventions:  Attentive, Engaged, Interested , and Receptive   Education Outcome:  Acknowledges education   Clinical Observations/Individualized Feedback: Betty Henderson was active in their participation of session activities and group discussion. 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 Pt interacted well with LRT and peers duration of session.   Plan: Continue to engage patient in RT group sessions 2-3x/week.   Rosina Lowenstein, LRT, CTRS 11/06/2022 3:11 PM

## 2022-11-06 NOTE — Group Note (Signed)
Date:  11/06/2022 Time:  1:11 PM  Group Topic/Focus:  Coping With Mental Health Crisis:   The purpose of this group is to help patients identify strategies for coping with mental health crisis.  Group discusses possible causes of crisis and ways to manage them effectively. Healthy Communication:   The focus of this group is to discuss communication, barriers to communication, as well as healthy ways to communicate with others. Overcoming Stress:   The focus of this group is to define stress and help patients assess their triggers.  We went outside to get some fresh air and we talked about healthy ways to cope with stress and mental health issues.     Participation Level:  Active  Participation Quality:  Appropriate  Affect:  Appropriate  Cognitive:  Appropriate  Insight: Appropriate  Engagement in Group:  Engaged  Modes of Intervention:  Discussion  Additional Comments:    Wadie Liew L Zamyia Gowell 11/06/2022, 1:11 PM

## 2022-11-07 DIAGNOSIS — F250 Schizoaffective disorder, bipolar type: Secondary | ICD-10-CM | POA: Diagnosis not present

## 2022-11-07 NOTE — Group Note (Signed)
Date:  11/07/2022 Time:  11:49 AM  Group Topic/Focus:  The cycle of anxiety  Patients identify triggers and traits that cause anxiety   Participation Level:  Active  Participation Quality:  Appropriate  Affect:  Appropriate  Cognitive:  Appropriate  Insight: Good and Improving  Engagement in Group:  Improving  Modes of Intervention:  Activity and Discussion  Additional Comments:    [name] 11/07/2022, 11:49 AM

## 2022-11-07 NOTE — Progress Notes (Addendum)
[facility] MD Progress Note  11/07/2022 Betty Henderson  MRN:  [id number]  Subjective: Case discussed with RN and social worker, chart reviewed, patient seen today during rounds.   No new acute events overnight.  Patient was pleasant and cooperative with assessment today.  Patient is awaiting placement.  Patient denies auditory or visual hallucinations.  Patient denies any thoughts of harming herself or others.No behavioral issues reported by the staff.  Per SW, caseworker at [facility] states that the court hearing should be scheduled  within the next 14 days.  After the court hearing pt will be assigned a guardian through the state.  Principal Problem: Schizoaffective disorder, bipolar type (HCC) Diagnosis: Principal Problem:   Schizoaffective disorder, bipolar type (HCC)   Past Psychiatric History: Schizoaffective disorder, bipolar type.  Past Medical History:  Past Medical History:  Diagnosis Date   Anemia    Arthritis    Chronic pain    Drug-seeking behavior    Malingering    Osteopetrosis    Psychosis (HCC)    Schizoaffective disorder, bipolar type (HCC)     Past Surgical History:  Procedure Laterality Date   MOUTH SURGERY     TUBAL LIGATION     Family History:  Family History  Family history unknown: Yes   Family Psychiatric  History: Unremarkable Social History:  Social History   Substance and Sexual Activity  Alcohol Use Not Currently   Comment: 1 cocktail 3 weeks ago     Social History   Substance and Sexual Activity  Drug Use Not Currently   Types: Cocaine   Comment: states "it's legal though"    Social History   Socioeconomic History   Marital status: Single    Spouse name: Not on file   Number of children: Not on file   Years of education: Not on file   Highest education level: Not on file  Occupational History   Not on file  Tobacco Use   Smoking status: Every Day    Current packs/day: 0.50    Types: Cigarettes    Smokeless tobacco: Never  Vaping Use   Vaping status: Never Used  Substance and Sexual Activity   Alcohol use: Not Currently    Comment: 1 cocktail 3 weeks ago   Drug use: Not Currently    Types: Cocaine    Comment: states "it's legal though"   Sexual activity: Never  Other Topics Concern   Not on file  Social History Narrative   Not on file   Social Determinants of Health   Financial Resource Strain: Not on file  Food Insecurity: Food Insecurity Present (08/22/2022)   Hunger Vital Sign    Worried About Running Out of Food in the Last Year: Sometimes true    Ran Out of Food in the Last Year: Sometimes true  Transportation Needs: Patient Unable To Answer (08/22/2022)   PRAPARE - Transportation    Lack of Transportation (Medical): Patient unable to answer    Lack of Transportation (Non-Medical): Patient unable to answer  Recent Concern: Transportation Needs - Unmet Transportation Needs (06/13/2022)   [facility] from [facility]   [facility] - Transportation    Lack of Transportation (Medical): Not on file    Lack of Transportation (Non-Medical): Yes  Physical Activity: Not on file  Stress: Not on file  Social Connections: Unknown (08/06/2021)   Received from [facility], [facility]   Social Network    Social Network: Not on file  Additional Social History:                         Sleep: Good  Appetite:  Good  Current Medications: Current Facility-Administered Medications  Medication Dose Route Frequency Provider Last Rate Last Admin   acetaminophen (TYLENOL) tablet 650 mg  650 mg Oral Q6H PRN [name], DO   650 mg at 11/01/22 2104   alum & mag hydroxide-simeth (MAALOX/MYLANTA) 200-200-20 MG/5ML suspension 30 mL  30 mL Oral Q4H PRN [name], DO   30 mL at 09/20/22 1142   diphenhydrAMINE (BENADRYL) capsule 50 mg  50 mg Oral Q6H PRN [name], DO   50 mg at 09/29/22 2254   Or   diphenhydrAMINE  (BENADRYL) injection 50 mg  50 mg Intramuscular Q6H PRN [name], DO       haloperidol (HALDOL) tablet 5 mg  5 mg Oral Q6H PRN [name], DO   5 mg at 09/28/22 2149   Or   haloperidol lactate (HALDOL) injection 5 mg  5 mg Intramuscular Q6H PRN [name], DO       ibuprofen (ADVIL) tablet 600 mg  600 mg Oral Q6H PRN [name], [name], MD   600 mg at 11/05/22 1237   LORazepam (ATIVAN) tablet 1 mg  1 mg Oral TID PRN [name], DO   1 mg at 09/27/22 0025   magnesium hydroxide (MILK OF MAGNESIA) suspension 30 mL  30 mL Oral Daily PRN [name], DO       [START ON 11/13/2022] paliperidone (INVEGA SUSTENNA) injection 156 mg  156 mg Intramuscular Once [name], MD       paliperidone (INVEGA) 24 hr tablet 3 mg  3 mg Oral QHS [name], DO   3 mg at 11/06/22 2129   traZODone (DESYREL) tablet 50 mg  50 mg Oral QHS PRN [name], DO   50 mg at 11/06/22 2129    Lab Results: No results found for this or any previous visit (from the past 48 hour(s)).  Blood Alcohol level:  Lab Results  Component Value Date   ETH <10 08/17/2022   ETH <10 01/11/2021    Metabolic Disorder Labs: No results found for: "HGBA1C", "MPG" No results found for: "PROLACTIN" No results found for: "CHOL", "TRIG", "HDL", "CHOLHDL", "VLDL", "LDLCALC"    Musculoskeletal: Strength & Muscle Tone: within normal limits Gait & Station: normal Patient leans: N/A   Psychiatric Specialty Exam:   Presentation  General Appearance:  Casual; Neat   Eye Contact: Fair   Speech:  Less Garbled   Speech Volume: Normal   Handedness: Right     Mood and Affect  Mood: Fine   Affect: Stable    Thought Processes: Probably at baseline. Improved since admission   Descriptions of Associations:Intact   Orientation:Partial   Thought Content:Denies SI/HI, delusional, at baseline   Hallucinations:Denies ,   Ideas of  Reference:Paranoia, at baseline   Suicidal Thoughts:Denies SI Homicidal Thoughts:Denies HI   Sensorium  Memory: Immediate Fair; Remote Poor   Judgment: Fair, pt is Rx compliant   Insight: Limited     Executive Functions  Concentration: Improved   Attention Span: Improved   Language: Fair     Psychomotor Activity  Psychomotor Activity:Normal   Assets  Assets: Communication Skills     Sleep  Sleep:Improved     Physical Exam: Physical Exam Vitals and nursing note reviewed.  Constitutional:      Appearance: Normal appearance. She is normal weight.  Neurological:     General: No focal deficit present.     Mental Status: She is alert.      Review of Systems  Constitutional: Negative.   HENT: Negative.    Eyes: Negative.   Respiratory: Negative.    Cardiovascular: Negative.   Gastrointestinal: Negative.   Genitourinary: Negative.   Musculoskeletal: Negative.   Skin: Negative.   Neurological: Negative.   Endo/Heme/Allergies: Negative.     Blood pressure (!) 113/59, pulse 85, temperature 97.9 F (36.6 C), resp. rate 16, height 5\' 6"  (1.676 m), weight 64 kg, SpO2 99%. Body mass index is 22.76 kg/m.   Treatment Plan Summary: Daily contact with patient to assess and evaluate symptoms and progress in treatment, Medication management, and Plan continue current medications.  Invega Sustenna 156 mg IM given, 10/15/22 to be repeated every 4 weeks. Continue on Invega 3 mg Po daily    [name], MD

## 2022-11-07 NOTE — Group Note (Signed)
Recreation Therapy Group Note   Group Topic:Healthy Support Systems  Group Date: 11/07/2022 Start Time: 1410 End Time: 1500 Facilitators: Rosina Lowenstein, LRT, CTRS Location:  Day room  Group Description: Straw Bridge.  Patients were given 10 plastic drinking straws and an equal length of masking tape. Using the materials provided, patients were instructed to build a free-standing bridge-like structure to suspend an everyday item (ex: deck of cards) off the floor or table surface. All materials were required to be used in Secondary school teacher. LRT facilitated post-activity discussion reviewing the importance of having strong and healthy support systems in our lives. LRT discussed how the people in our lives serve as the tape and the deck of cards we placed on top of our straw structure are the stressors we face in daily life. LRT and pts discussed what happens in our life when things get too heavy for [place], and we don't have strong supports outside of the hospital. Pt shared 2 of their healthy supports aloud in the group.    Goal Area(s) Addressed:  Patient will identify 2 healthy supports in their life. Patient will identify skills to successfully complete activity. Patient will identify correlation of this activity to life post-discharge.  Patient will work on Product manager.   Affect/Mood: N/A   Participation Level: Did not attend    Clinical Observations/Individualized Feedback: Britt did not attend group.  Plan: Continue to engage patient in RT group sessions 2-3x/week.   Rosina Lowenstein, LRT, CTRS 11/07/2022 3:33 PM

## 2022-11-07 NOTE — Group Note (Signed)
[facility] LCSW Group Therapy Note   Group Date: 11/07/2022 Start Time: 1315 End Time: 1345   Type of Therapy/Topic:  Group Therapy:  Balance in Life  Participation Level:  Did Not Attend   Description of Group:    This group will address the concept of balance and how it feels and looks when one is unbalanced. Patients will be encouraged to process areas in their lives that are out of balance, and identify reasons for remaining unbalanced. Facilitators will guide patients utilizing problem- solving interventions to address and correct the stressor making their life unbalanced. Understanding and applying boundaries will be explored and addressed for obtaining  and maintaining a balanced life. Patients will be encouraged to explore ways to assertively make their unbalanced needs known to significant others in their lives, using other group members and facilitator for support and feedback.  Therapeutic Goals: Patient will identify two or more emotions or situations they have that consume much of in their lives. Patient will identify signs/triggers that life has become out of balance:  Patient will identify two ways to set boundaries in order to achieve balance in their lives:  Patient will demonstrate ability to communicate their needs through discussion and/or role plays  Summary of Patient Progress:    X    Therapeutic Modalities:   Cognitive Behavioral Therapy Solution-Focused Therapy Assertiveness Training   Elza Rafter, LCSWA

## 2022-11-07 NOTE — BH IP Treatment Plan (Signed)
Interdisciplinary Treatment and Diagnostic Plan Update  11/07/2022 Time of Session: 9:00 AM  Betty Henderson MRN: [id number]  Principal Diagnosis: Schizoaffective disorder, bipolar type (HCC)  Secondary Diagnoses: Principal Problem:   Schizoaffective disorder, bipolar type (HCC)   Current Medications:  Current Facility-Administered Medications  Medication Dose Route Frequency Provider Last Rate Last Admin   acetaminophen (TYLENOL) tablet 650 mg  650 mg Oral Q6H PRN [name], DO   650 mg at 11/01/22 2104   alum & mag hydroxide-simeth (MAALOX/MYLANTA) 200-200-20 MG/5ML suspension 30 mL  30 mL Oral Q4H PRN [name], DO   30 mL at 09/20/22 1142   diphenhydrAMINE (BENADRYL) capsule 50 mg  50 mg Oral Q6H PRN [name], DO   50 mg at 09/29/22 2254   Or   diphenhydrAMINE (BENADRYL) injection 50 mg  50 mg Intramuscular Q6H PRN [name], DO       haloperidol (HALDOL) tablet 5 mg  5 mg Oral Q6H PRN [name], DO   5 mg at 09/28/22 2149   Or   haloperidol lactate (HALDOL) injection 5 mg  5 mg Intramuscular Q6H PRN [name], DO       ibuprofen (ADVIL) tablet 600 mg  600 mg Oral Q6H PRN [name], [name], MD   600 mg at 11/05/22 1237   LORazepam (ATIVAN) tablet 1 mg  1 mg Oral TID PRN [name], DO   1 mg at 09/27/22 0025   magnesium hydroxide (MILK OF MAGNESIA) suspension 30 mL  30 mL Oral Daily PRN [name], DO       [START ON 11/13/2022] paliperidone (INVEGA SUSTENNA) injection 156 mg  156 mg Intramuscular Once [name], MD       paliperidone (INVEGA) 24 hr tablet 3 mg  3 mg Oral QHS [name], DO   3 mg at 11/06/22 2129   traZODone (DESYREL) tablet 50 mg  50 mg Oral QHS PRN [name], DO   50 mg at 11/06/22 2129   PTA Medications: Medications Prior to Admission  Medication Sig Dispense Refill Last Dose   ondansetron (ZOFRAN) 4 MG tablet Take 1  tablet (4 mg total) by mouth every 8 (eight) hours as needed for nausea or vomiting. (Patient not taking: Reported on 08/17/2022) 20 tablet 0     Patient Stressors: Financial difficulties   Health problems   Medication change or noncompliance    Patient Strengths: Ability for insight   Treatment Modalities: Medication Management, Group therapy, Case management,  1 to 1 session with clinician, Psychoeducation, Recreational therapy.   Physician Treatment Plan for Primary Diagnosis: Schizoaffective disorder, bipolar type (HCC) Long Term Goal(s): Improvement in symptoms so as ready for discharge   Short Term Goals: Ability to identify changes in lifestyle to reduce recurrence of condition will improve Ability to verbalize feelings will improve Ability to disclose and discuss suicidal ideas Ability to demonstrate self-control will improve Ability to identify and develop effective coping behaviors will improve Ability to maintain clinical measurements within normal limits will improve Compliance with prescribed medications will improve Ability to identify triggers associated with substance abuse/mental health issues will improve  Medication Management: Evaluate patient's response, side effects, and tolerance of medication regimen.  Therapeutic Interventions: 1 to 1 sessions, Unit Group sessions and Medication administration.  Evaluation of Outcomes: Progressing  Physician Treatment Plan for Secondary Diagnosis: Principal Problem:   Schizoaffective disorder, bipolar type (HCC)  Long Term Goal(s): Improvement in symptoms  so as ready for discharge   Short Term Goals: Ability to identify changes in lifestyle to reduce recurrence of condition will improve Ability to verbalize feelings will improve Ability to disclose and discuss suicidal ideas Ability to demonstrate self-control will improve Ability to identify and develop effective coping behaviors will improve Ability to maintain  clinical measurements within normal limits will improve Compliance with prescribed medications will improve Ability to identify triggers associated with substance abuse/mental health issues will improve     Medication Management: Evaluate patient's response, side effects, and tolerance of medication regimen.  Therapeutic Interventions: 1 to 1 sessions, Unit Group sessions and Medication administration.  Evaluation of Outcomes: Progressing   RN Treatment Plan for Primary Diagnosis: Schizoaffective disorder, bipolar type (HCC) Long Term Goal(s): Knowledge of disease and therapeutic regimen to maintain health will improve  Short Term Goals: Ability to remain free from injury will improve, Ability to verbalize frustration and anger appropriately will improve, Ability to demonstrate self-control, Ability to participate in decision making will improve, Ability to verbalize feelings will improve, Ability to disclose and discuss suicidal ideas, Ability to identify and develop effective coping behaviors will improve, and Compliance with prescribed medications will improve  Medication Management: RN will administer medications as ordered by provider, will assess and evaluate patient's response and provide education to patient for prescribed medication. RN will report any adverse and/or side effects to prescribing provider.  Therapeutic Interventions: 1 on 1 counseling sessions, Psychoeducation, Medication administration, Evaluate responses to treatment, Monitor vital signs and CBGs as ordered, Perform/monitor CIWA, COWS, AIMS and Fall Risk screenings as ordered, Perform wound care treatments as ordered.  Evaluation of Outcomes: Progressing   LCSW Treatment Plan for Primary Diagnosis: Schizoaffective disorder, bipolar type (HCC) Long Term Goal(s): Safe transition to appropriate next level of care at discharge, Engage patient in therapeutic group addressing interpersonal concerns.  Short Term Goals:  Engage patient in aftercare planning with referrals and resources, Increase social support, Increase ability to appropriately verbalize feelings, Increase emotional regulation, Facilitate acceptance of mental health diagnosis and concerns, and Increase skills for wellness and recovery  Therapeutic Interventions: Assess for all discharge needs, 1 to 1 time with Social worker, Explore available resources and support systems, Assess for adequacy in community support network, Educate family and significant other(s) on suicide prevention, Complete Psychosocial Assessment, Interpersonal group therapy.  Evaluation of Outcomes: Progressing   Progress in Treatment: Attending groups: Yes. Participating in groups: Yes. Taking medication as prescribed: Yes. Toleration medication: Yes. Family/Significant other contact made: Yes, individual(s) contacted:  Karren Burly  Patient understands diagnosis: Yes. Discussing patient identified problems/goals with staff: Yes. Medical problems stabilized or resolved: Yes. Denies suicidal/homicidal ideation: Yes. Issues/concerns per patient self-inventory: Yes. and No. Other: None    New Short Term/Long Term Goal(s): Update 6/30: none at this time. Update 09/27/2022:  No changes at this time.  Update 10/02/2022:  No changes at this time. Update 10/07/22: No changes at this time 10/12/22: No changes at this time Update 10/18/22: No changes at this time Update 10/23/22: No changes at this time Update 10/28/22: No changes at this time Update 11/07/22: No changes at this time        Patient Goals:  Update 6/30: none at this time. Update 09/27/2022:  No changes at this time. Update 10/02/2022:  No changes at this time. Update 10/07/22: No changes at this time 10/12/22: No changes at this time Update 10/18/22: No changes at this time Update 10/23/22: No changes at this time Update 10/28/22:  No changes at this time Update 11/07/22: No changes at this time       Discharge Plan or  Barriers: Update 6/30: APS report has been made and patient is being investigated for guardianship needs.  Remains homeless with limited supports. Update 09/27/2022:  No changes at this time.  Update 10/02/2022:  Patient remains safe on the unit at this time.  Patient remains psychotic at this time.  APS report has been made, however, no follow up from the caseworker on her case.  No safe discharge identified.  CSW has requested that application for Medicaid and disability be completed.   Update 10/07/22: No changes at this time 10/12/22: No changes at this time Update 10/18/22: No changes at this time Update 10/23/22: No changes at this time Update 10/23/22: No changes at this time Update 10/28/22: According to [facility], pt's caseworker at [facility], the petition was filed for guardianship on 10/21/22, now awaiting for the petition to be approvedUpdate 11/07/22: No changes at this time      Reason for Continuation of Hospitalization: Hallucinations Mania Medication stabilization   Estimated Length of Stay:  Update 6/30: 1-7 days Update 09/27/2022:  TBD Update 10/02/2022:  No changes at this time. Upda[place] 10/18/22: No changes at this time te 10/07/22: No changes at this time Update 10/18/22: No changes at this time Update 10/23/22: No changes at this time Update 10/28/22: No changes at this time Update 11/07/22: No changes at this time  Last 3 [place] Suicide Severity Risk Score: Flowsheet Row Admission (Current) from 08/22/2022 in [facility] ED from 08/17/2022 in [facility] Emergency Department at [facility] ED from 08/12/2022 in [facility] Emergency Department at [facility]  C-SSRS RISK CATEGORY Low Risk No Risk No Risk       Last [facility] 2/9 Scores:     No data to display          Scribe for Treatment Team: Elza Rafter, Theresia Majors 11/07/2022 11:38 AM

## 2022-11-08 DIAGNOSIS — F250 Schizoaffective disorder, bipolar type: Secondary | ICD-10-CM | POA: Diagnosis not present

## 2022-11-08 NOTE — Plan of Care (Signed)
D: Pt alert and oriented. Pt denies experiencing any depression or anxiety at this time. Pt reports experiencing 8/10 Left leg pain at this time. Prn medication given. Pt denies experiencing any SI/HI, or AVH at this time.   A: Support and encouragement provided. Frequent verbal contact made. Routine safety checks conducted q15 minutes.   R: Pt verbally contracts for safety at this time. Pt interacts well but minimally with others on the unit. Pt remains safe at this time. Plan of care ongoing.  Problem: Nutrition: Goal: Adequate nutrition will be maintained Outcome: Progressing   Problem: Coping: Goal: Level of anxiety will decrease Outcome: Progressing

## 2022-11-08 NOTE — Group Note (Signed)
Date:  11/08/2022 Time:  9:44 PM  Group Topic/Focus:  Personal Choices and Values:   The focus of this group is to help patients assess and explore the importance of values in their lives, how their values affect their decisions, how they express their values and what opposes their expression.    Participation Level:  Did Not Attend  Participation Quality:   Did Not Attend  Affect:   Did Not Attend  Cognitive:   Did Not Attend  Insight: None  Engagement in Group:  None  Modes of Intervention:  Support  Additional Comments:    [name] 11/08/2022, 9:44 PM

## 2022-11-08 NOTE — Progress Notes (Signed)
Patient received in bed with eyes open. RR even and unlabored. Pt remain in bed throughout the shift.  Po med compliant. States she needs something for sleep. Trazodone 50 mg po given as ordered PRN for insomnia. No behavior issues noted. Denies SI/HI/AVH. No c/o pain/discomfort noted.

## 2022-11-08 NOTE — Group Note (Signed)
Recreation Therapy Group Note   Group Topic:Leisure Education  Group Date: 11/08/2022 Start Time: 1340 End Time: 1415 Facilitators: Rosina Lowenstein, LRT, CTRS Location: Courtyard   Group Description: Leisure. Patients were given the opportunity to play ring toss, play cards, or listen to music while sitting in the courtyard getting fresh air and sunlight. LRT and pts discussed the meaning of leisure, the importance of participating in leisure during their free time/when they're outside of the hospital, as well as how our leisure interests can also serve as coping skills. Pt identified two leisure interests and shared with the group.   Goal Area(s) Addressed:  Patient will identify a current leisure interest.  Patient will learn the definition of "leisure". Patient will practice making a positive decision. Patient will have the opportunity to try a new leisure activity. Patient will communicate with peers and LRT.    Affect/Mood: Appropriate   Participation Level: Active and Engaged   Participation Quality: Independent   Behavior: Appropriate, Calm, and Cooperative   Speech/Thought Process: Coherent   Insight: Good   Judgement: Good   Modes of Intervention: Activity   Patient Response to Interventions:  Attentive, Engaged, Interested , and Receptive   Education Outcome:  Acknowledges education   Clinical Observations/Individualized Feedback: [facility] was active in their participation of session activities and group discussion. Pt identified "chill by myself" as something she does in her free time. Pt interacted well with LRT and peers duration of session.   Plan: Continue to engage patient in RT group sessions 2-3x/week.   Rosina Lowenstein, LRT, CTRS 11/08/2022 2:36 PM

## 2022-11-08 NOTE — Group Note (Signed)
Date:  11/08/2022 Time:  3:43 AM  Group Topic/Focus:  Healthy Communication:   The focus of this group is to discuss communication, barriers to communication, as well as healthy ways to communicate with others.    Participation Level:  Did Not Attend  Participation Quality:    Did Not Attend  Affect:    Did Not Attend  Cognitive:    Did Not Attend  Insight: None  Engagement in Group:  None  Modes of Intervention:  Exploration  Additional Comments:    [name] 11/08/2022, 3:43 AM

## 2022-11-08 NOTE — Group Note (Signed)
Date:  11/08/2022 Time:  10:47 AM  Group Topic/Focus:  Goals Group:   The focus of this group is to help patients establish daily goals to achieve during treatment and discuss how the patient can incorporate goal setting into their daily lives to aide in recovery.    Participation Level:  Active  Participation Quality:  Appropriate  Affect:  Appropriate  Cognitive:  Appropriate  Insight: Appropriate  Engagement in Group:  Engaged  Modes of Intervention:  Discussion  Additional Comments:  None  Rodena Goldmann 11/08/2022, 10:47 AM

## 2022-11-08 NOTE — Progress Notes (Addendum)
[facility] MD Progress Note  11/08/2022 Betty Henderson  MRN:  [id number]  Subjective: Case discussed with RN and social worker, chart reviewed, patient seen today during rounds.   No new acute events overnight.  Patient was pleasant and cooperative with assessment today.  Patient is awaiting placement.  Patient denies auditory or visual hallucinations.  Patient denies any thoughts of harming herself or others.No behavioral issues reported by the staff.  Per SW, caseworker at [facility] [name] states that the court hearing should be scheduled  within the next 14 days.  After the court hearing pt will be assigned a guardian through the state.  Principal Problem: Schizoaffective disorder, bipolar type (HCC) Diagnosis: Principal Problem:   Schizoaffective disorder, bipolar type (HCC)   Past Psychiatric History: Schizoaffective disorder, bipolar type.  Past Medical History:  Past Medical History:  Diagnosis Date   Anemia    Arthritis    Chronic pain    Drug-seeking behavior    Malingering    Osteopetrosis    Psychosis (HCC)    Schizoaffective disorder, bipolar type (HCC)     Past Surgical History:  Procedure Laterality Date   MOUTH SURGERY     TUBAL LIGATION     Family History:  Family History  Family history unknown: Yes   Family Psychiatric  History: Unremarkable Social History:  Social History   Substance and Sexual Activity  Alcohol Use Not Currently   Comment: 1 cocktail 3 weeks ago     Social History   Substance and Sexual Activity  Drug Use Not Currently   Types: Cocaine   Comment: states "it's legal though"    Social History   Socioeconomic History   Marital status: Single    Spouse name: Not on file   Number of children: Not on file   Years of education: Not on file   Highest education level: Not on file  Occupational History   Not on file  Tobacco Use   Smoking status: Every Day    Current packs/day: 0.50    Types: Cigarettes    Smokeless tobacco: Never  Vaping Use   Vaping status: Never Used  Substance and Sexual Activity   Alcohol use: Not Currently    Comment: 1 cocktail 3 weeks ago   Drug use: Not Currently    Types: Cocaine    Comment: states "it's legal though"   Sexual activity: Never  Other Topics Concern   Not on file  Social History Narrative   Not on file   Social Determinants of Health   Financial Resource Strain: Not on file  Food Insecurity: Food Insecurity Present (08/22/2022)   Hunger Vital Sign    Worried About Running Out of Food in the Last Year: Sometimes true    Ran Out of Food in the Last Year: Sometimes true  Transportation Needs: Patient Unable To Answer (08/22/2022)   PRAPARE - Transportation    Lack of Transportation (Medical): Patient unable to answer    Lack of Transportation (Non-Medical): Patient unable to answer  Recent Concern: Transportation Needs - Unmet Transportation Needs (06/13/2022)   Received from [facility], [facility]   [facility] - Transportation    Lack of Transportation (Medical): Not on file    Lack of Transportation (Non-Medical): Yes  Physical Activity: Not on file  Stress: Not on file  Social Connections: Unknown (08/06/2021)   Received from [facility], [facility]   Social Network    Social Network: Not on file  Additional Social History:                         Sleep: Good  Appetite:  Good  Current Medications: Current Facility-Administered Medications  Medication Dose Route Frequency Provider Last Rate Last Admin   acetaminophen (TYLENOL) tablet 650 mg  650 mg Oral Q6H PRN [name], DO   650 mg at 11/01/22 2104   alum & mag hydroxide-simeth (MAALOX/MYLANTA) 200-200-20 MG/5ML suspension 30 mL  30 mL Oral Q4H PRN [name], DO   30 mL at 09/20/22 1142   diphenhydrAMINE (BENADRYL) capsule 50 mg  50 mg Oral Q6H PRN [name], DO   50 mg at 09/29/22 2254   Or   diphenhydrAMINE  (BENADRYL) injection 50 mg  50 mg Intramuscular Q6H PRN [name], DO       haloperidol (HALDOL) tablet 5 mg  5 mg Oral Q6H PRN [name], DO   5 mg at 09/28/22 2149   Or   haloperidol lactate (HALDOL) injection 5 mg  5 mg Intramuscular Q6H PRN [name], DO       ibuprofen (ADVIL) tablet 600 mg  600 mg Oral Q6H PRN [name], [name], MD   600 mg at 11/08/22 1012   LORazepam (ATIVAN) tablet 1 mg  1 mg Oral TID PRN [name], DO   1 mg at 09/27/22 0025   magnesium hydroxide (MILK OF MAGNESIA) suspension 30 mL  30 mL Oral Daily PRN [name], DO   30 mL at 11/08/22 1012   [START ON 11/13/2022] paliperidone (INVEGA SUSTENNA) injection 156 mg  156 mg Intramuscular Once [name], MD       paliperidone (INVEGA) 24 hr tablet 3 mg  3 mg Oral QHS [name], DO   3 mg at 11/07/22 2134   traZODone (DESYREL) tablet 50 mg  50 mg Oral QHS PRN [name], DO   50 mg at 11/07/22 2134    Lab Results: No results found for this or any previous visit (from the past 48 hour(s)).  Blood Alcohol level:  Lab Results  Component Value Date   ETH <10 08/17/2022   ETH <10 01/11/2021    Metabolic Disorder Labs: No results found for: "HGBA1C", "MPG" No results found for: "PROLACTIN" No results found for: "CHOL", "TRIG", "HDL", "CHOLHDL", "VLDL", "LDLCALC"    Musculoskeletal: Strength & Muscle Tone: within normal limits Gait & Station: normal Patient leans: N/A   Psychiatric Specialty Exam:   Presentation  General Appearance:  Casual; Neat   Eye Contact: Fair   Speech:  Less Garbled   Speech Volume: Normal   Handedness: Right     Mood and Affect  Mood: Fine   Affect: Stable    Thought Processes: Probably at baseline. Improved since admission   Descriptions of Associations:Intact   Orientation:Partial   Thought Content:Denies SI/HI, delusional, at baseline    Hallucinations:Denies ,   Ideas of Reference:None   Suicidal Thoughts:Denies SI Homicidal Thoughts:Denies HI   Sensorium  Memory: Immediate Fair; Remote Poor   Judgment: Fair, pt is Rx compliant   Insight: Limited     Executive Functions  Concentration: Improved   Attention Span: Improved   Language: Fair     Psychomotor Activity  Psychomotor Activity:Normal   Assets  Assets: Communication Skills     Sleep  Sleep:Improved     Physical Exam: Physical Exam Vitals and nursing note reviewed.  Constitutional:      Appearance: Normal appearance. She is normal weight.  Neurological:     General: No focal deficit present.     Mental Status: She is alert.      Review of Systems  Constitutional: Negative.   HENT: Negative.    Eyes: Negative.   Respiratory: Negative.    Cardiovascular: Negative.   Gastrointestinal: Negative.   Genitourinary: Negative.   Musculoskeletal: Negative.   Skin: Negative.   Neurological: Negative.   Endo/Heme/Allergies: Negative.     Blood pressure 112/60, pulse 69, temperature 97.7 F (36.5 C), resp. rate 18, height 5\' 6"  (1.676 m), weight 64 kg, SpO2 97%. Body mass index is 22.76 kg/m.   Treatment Plan Summary: Daily contact with patient to assess and evaluate symptoms and progress in treatment, Medication management, and Plan continue current medications.  Invega Sustenna 156 mg IM given, 10/15/22 to be repeated every 4 weeks. Continue on Invega 3 mg Po daily    [name], MD

## 2022-11-08 NOTE — Plan of Care (Signed)

## 2022-11-09 DIAGNOSIS — F250 Schizoaffective disorder, bipolar type: Secondary | ICD-10-CM | POA: Diagnosis not present

## 2022-11-09 NOTE — Plan of Care (Signed)
Calm and cooperative. Pt remain in bed throughout the shift. Limited interaction with peers and staff. Po med compliant. Trazodone 50 mg po given as ordered PRN for insomnia per request. Tol well. No behavior issues noted. No c/o pain/discomfort noted.   Problem: Coping: Goal: Level of anxiety will decrease Outcome: Progressing   Problem: Elimination: Goal: Will not experience complications related to bowel motility Outcome: Progressing Goal: Will not experience complications related to urinary retention Outcome: Progressing   Problem: Pain Managment: Goal: General experience of comfort will improve Outcome: Progressing   Problem: Safety: Goal: Ability to remain free from injury will improve Outcome: Progressing

## 2022-11-09 NOTE — Plan of Care (Signed)

## 2022-11-09 NOTE — Progress Notes (Signed)
Patient pleasant and cooperative.  Denies SI/HI and AVH.  Patient has not been observed responding to internal stimuli. Pain rated 8/10 in legs.  PRN Tylenol given.    No scheduled medications.  15 min checks in place for safety.  Patient is present in the milieu.  Minimal interaction with peers.  Able to express needs to staff.  Attended MHT group in courtyard.

## 2022-11-09 NOTE — Progress Notes (Addendum)
[facility] MD Progress Note  11/09/2022 Betty Henderson  MRN:  [id number]  Subjective: Case discussed with RN and social worker, chart reviewed, patient seen today during rounds.  No new acute events overnight.  Patient was pleasant and cooperative with the assessment today.  Patient is awaiting placement.  Patient denies auditory or visual hallucinations.  Patient denies any thoughts of harming herself or others.No behavioral issues reported by the staff.  Per SW, caseworker at [facility] states that the court hearing should be scheduled  within the next 14 days.  After the court hearing pt will be assigned a guardian through the state.  Principal Problem: Schizoaffective disorder, bipolar type (HCC) Diagnosis: Principal Problem:   Schizoaffective disorder, bipolar type (HCC)   Past Psychiatric History: Schizoaffective disorder, bipolar type.  Past Medical History:  Past Medical History:  Diagnosis Date   Anemia    Arthritis    Chronic pain    Drug-seeking behavior    Malingering    Osteopetrosis    Psychosis (HCC)    Schizoaffective disorder, bipolar type (HCC)     Past Surgical History:  Procedure Laterality Date   MOUTH SURGERY     TUBAL LIGATION     Family History:  Family History  Family history unknown: Yes   Family Psychiatric  History: Unremarkable Social History:  Social History   Substance and Sexual Activity  Alcohol Use Not Currently   Comment: 1 cocktail 3 weeks ago     Social History   Substance and Sexual Activity  Drug Use Not Currently   Types: Cocaine   Comment: states "it's legal though"    Social History   Socioeconomic History   Marital status: Single    Spouse name: Not on file   Number of children: Not on file   Years of education: Not on file   Highest education level: Not on file  Occupational History   Not on file  Tobacco Use   Smoking status: Every Day    Current packs/day: 0.50    Types: Cigarettes    Smokeless tobacco: Never  Vaping Use   Vaping status: Never Used  Substance and Sexual Activity   Alcohol use: Not Currently    Comment: 1 cocktail 3 weeks ago   Drug use: Not Currently    Types: Cocaine    Comment: states "it's legal though"   Sexual activity: Never  Other Topics Concern   Not on file  Social History Narrative   Not on file   Social Determinants of Health   Financial Resource Strain: Not on file  Food Insecurity: Food Insecurity Present (08/22/2022)   Hunger Vital Sign    Worried About Running Out of Food in the Last Year: Sometimes true    Ran Out of Food in the Last Year: Sometimes true  Transportation Needs: Patient Unable To Answer (08/22/2022)   PRAPARE - Transportation    Lack of Transportation (Medical): Patient unable to answer    Lack of Transportation (Non-Medical): Patient unable to answer  Recent Concern: Transportation Needs - Unmet Transportation Needs (06/13/2022)   Received from [facility], [facility]   [facility] - Transportation    Lack of Transportation (Medical): Not on file    Lack of Transportation (Non-Medical): Yes  Physical Activity: Not on file  Stress: Not on file  Social Connections: Unknown (08/06/2021)   Received from [facility], [facility]   Social Network    Social Network: Not on file  Additional Social History:                         Sleep: Good  Appetite:  Good  Current Medications: Current Facility-Administered Medications  Medication Dose Route Frequency Provider Last Rate Last Admin   acetaminophen (TYLENOL) tablet 650 mg  650 mg Oral Q6H PRN [name], DO   650 mg at 11/09/22 1507   alum & mag hydroxide-simeth (MAALOX/MYLANTA) 200-200-20 MG/5ML suspension 30 mL  30 mL Oral Q4H PRN [name], DO   30 mL at 09/20/22 1142   diphenhydrAMINE (BENADRYL) capsule 50 mg  50 mg Oral Q6H PRN [name], DO   50 mg at 09/29/22 2254   Or   diphenhydrAMINE  (BENADRYL) injection 50 mg  50 mg Intramuscular Q6H PRN [name], DO       haloperidol (HALDOL) tablet 5 mg  5 mg Oral Q6H PRN [name], DO   5 mg at 09/28/22 2149   Or   haloperidol lactate (HALDOL) injection 5 mg  5 mg Intramuscular Q6H PRN [name], DO       ibuprofen (ADVIL) tablet 600 mg  600 mg Oral Q6H PRN [name], [name], MD   600 mg at 11/08/22 1012   LORazepam (ATIVAN) tablet 1 mg  1 mg Oral TID PRN [name], DO   1 mg at 09/27/22 0025   magnesium hydroxide (MILK OF MAGNESIA) suspension 30 mL  30 mL Oral Daily PRN [name], DO   30 mL at 11/08/22 1012   [START ON 11/13/2022] paliperidone (INVEGA SUSTENNA) injection 156 mg  156 mg Intramuscular Once [name], MD       paliperidone (INVEGA) 24 hr tablet 3 mg  3 mg Oral QHS [name], DO   3 mg at 11/08/22 2147   traZODone (DESYREL) tablet 50 mg  50 mg Oral QHS PRN [name], DO   50 mg at 11/08/22 2147    Lab Results: No results found for this or any previous visit (from the past 48 hour(s)).  Blood Alcohol level:  Lab Results  Component Value Date   ETH <10 08/17/2022   ETH <10 01/11/2021    Metabolic Disorder Labs: No results found for: "HGBA1C", "MPG" No results found for: "PROLACTIN" No results found for: "CHOL", "TRIG", "HDL", "CHOLHDL", "VLDL", "LDLCALC"    Musculoskeletal: Strength & Muscle Tone: within normal limits Gait & Station: normal Patient leans: N/A   Psychiatric Specialty Exam:   Presentation  General Appearance:  Casual; Neat   Eye Contact: Fair   Speech:  Less Garbled   Speech Volume: Normal   Handedness: Right     Mood and Affect  Mood: Fine   Affect: Stable    Thought Processes: Probably at baseline. Improved since admission   Descriptions of Associations:Intact   Orientation: improved   Thought Content: at baseline   Hallucinations:Denies ,   Ideas of  Reference:None   Suicidal Thoughts:Denies SI Homicidal Thoughts:Denies HI   Sensorium  Memory: Immediate Fair; Remote Poor   Judgment: Fair, pt is Rx compliant   Insight: Limited     Executive Functions  Concentration: Improved   Attention Span: Improved   Language: Fair     Psychomotor Activity  Psychomotor Activity:Normal   Assets  Assets: Communication Skills     Sleep  Sleep:Improved     Physical Exam: Physical Exam Vitals and nursing note reviewed.  Constitutional:      Appearance: Normal appearance. She is normal weight.  Neurological:     General: No focal deficit present.     Mental Status: She is alert.      Review of Systems  Constitutional: Negative.   HENT: Negative.    Eyes: Negative.   Respiratory: Negative.    Cardiovascular: Negative.   Gastrointestinal: Negative.   Genitourinary: Negative.   Musculoskeletal: Negative.   Skin: Negative.   Neurological: Negative.   Endo/Heme/Allergies: Negative.     Blood pressure 120/72, pulse (!) 59, temperature 97.7 F (36.5 C), resp. rate 18, height 5\' 6"  (1.676 m), weight 64 kg, SpO2 96%. Body mass index is 22.76 kg/m.   Treatment Plan Summary: Daily contact with patient to assess and evaluate symptoms and progress in treatment, Medication management, and Plan continue current medications.  Invega Sustenna 156 mg IM given, 10/15/22 to be repeated every 4 weeks. Continue on Invega 3 mg Po daily    [name], MD

## 2022-11-09 NOTE — Group Note (Signed)
Date:  11/09/2022 Time:  10:21 AM  Group Topic/Focus:  OUTDOOR RECREATION  AND RAPPORT BUILDING    Participation Level:  Active  Participation Quality:  Appropriate, Attentive, and Sharing  Affect:  Appropriate  Cognitive:  Alert, Appropriate, and Oriented  Insight: Appropriate  Engagement in Group:  Engaged  Modes of Intervention:  Activity, Discussion, and Socialization  Additional Comments:    [name] 11/09/2022, 10:21 AM

## 2022-11-09 NOTE — Plan of Care (Signed)
  Problem: Nutrition: Goal: Adequate nutrition will be maintained Outcome: Progressing   Problem: Coping: Goal: Level of anxiety will decrease Outcome: Progressing   Patient compliant with medications denies SI/HI/A/VH at present visible in Milieu interacting well with Peers and Staff no medications adverse effects noted. Q 15 safety checks ongoing. Patient remains safe.

## 2022-11-10 DIAGNOSIS — F250 Schizoaffective disorder, bipolar type: Secondary | ICD-10-CM | POA: Diagnosis not present

## 2022-11-10 NOTE — Group Note (Addendum)
LCSW Group Therapy Note   Group Date: 11/10/2022 Start Time: 1350 End Time: 1450   Type of Therapy and Topic:  Group Therapy: 8 Dimensions of Wellness  Participation Level:  Active      Summary of Patient Progress:  The patient attended group. Patient proved open to input from peers and feedback from [facility]. The patient was respectful of peers and participated throughout the entire session. The patient shared a little on her life experiences.      Marshell Levan, LCSWA 11/10/2022  3:28 PM

## 2022-11-10 NOTE — Plan of Care (Signed)
  Problem: Education: Goal: Knowledge of General Education information will improve Description: Including pain rating scale, medication(s)/side effects and non-pharmacologic comfort measures Outcome: Progressing   Problem: Pain Managment: Goal: General experience of comfort will improve Outcome: Progressing   Problem: Safety: Goal: Ability to remain free from injury will improve Outcome: Progressing   

## 2022-11-10 NOTE — Progress Notes (Addendum)
[facility] MD Progress Note  11/10/2022 Betty Henderson  MRN:  [id number]  Subjective: Case discussed with RN and social worker, chart reviewed, patient seen today during rounds.   No new acute events overnight.  Patient was pleasant and cooperative with assessment today.  Patient had questions about the her discharge.  Patient reports that she can go to her son's house.  Patient was informed that her son has refused to allow patient to stay with him upon discharge.  Patient is awaiting placement.  Patient denies auditory or visual hallucinations.  Patient denies any thoughts of harming herself or others.No behavioral issues reported by the staff.  Per SW, caseworker at [facility] [name] states that the court hearing should be scheduled  within the next 14 days.  After the court hearing pt will be assigned a guardian through the state.  Principal Problem: Schizoaffective disorder, bipolar type (HCC) Diagnosis: Principal Problem:   Schizoaffective disorder, bipolar type (HCC)   Past Psychiatric History: Schizoaffective disorder, bipolar type.  Past Medical History:  Past Medical History:  Diagnosis Date   Anemia    Arthritis    Chronic pain    Drug-seeking behavior    Malingering    Osteopetrosis    Psychosis (HCC)    Schizoaffective disorder, bipolar type (HCC)     Past Surgical History:  Procedure Laterality Date   MOUTH SURGERY     TUBAL LIGATION     Family History:  Family History  Family history unknown: Yes   Family Psychiatric  History: Unremarkable Social History:  Social History   Substance and Sexual Activity  Alcohol Use Not Currently   Comment: 1 cocktail 3 weeks ago     Social History   Substance and Sexual Activity  Drug Use Not Currently   Types: Cocaine   Comment: states "it's legal though"    Social History   Socioeconomic History   Marital status: Single    Spouse name: Not on file   Number of children: Not on file   Years of  education: Not on file   Highest education level: Not on file  Occupational History   Not on file  Tobacco Use   Smoking status: Every Day    Current packs/day: 0.50    Types: Cigarettes   Smokeless tobacco: Never  Vaping Use   Vaping status: Never Used  Substance and Sexual Activity   Alcohol use: Not Currently    Comment: 1 cocktail 3 weeks ago   Drug use: Not Currently    Types: Cocaine    Comment: states "it's legal though"   Sexual activity: Never  Other Topics Concern   Not on file  Social History Narrative   Not on file   Social Determinants of Health   Financial Resource Strain: Not on file  Food Insecurity: Food Insecurity Present (08/22/2022)   Hunger Vital Sign    Worried About Running Out of Food in the Last Year: Sometimes true    Ran Out of Food in the Last Year: Sometimes true  Transportation Needs: Patient Unable To Answer (08/22/2022)   PRAPARE - Transportation    Lack of Transportation (Medical): Patient unable to answer    Lack of Transportation (Non-Medical): Patient unable to answer  Recent Concern: Transportation Needs - Unmet Transportation Needs (06/13/2022)   Received from [facility], [facility]   [facility] - Transportation    Lack of Transportation (Medical): Not on file    Lack of Transportation (Non-Medical): Yes  Physical Activity: Not on file  Stress: Not on file  Social Connections: Unknown (08/06/2021)   Received from [facility], [facility]   Social Network    Social Network: Not on file   Additional Social History:                         Sleep: Good  Appetite:  Good  Current Medications: Current Facility-Administered Medications  Medication Dose Route Frequency Provider Last Rate Last Admin   acetaminophen (TYLENOL) tablet 650 mg  650 mg Oral Q6H PRN [name], DO   650 mg at 11/09/22 1507   alum & mag hydroxide-simeth (MAALOX/MYLANTA) 200-200-20 MG/5ML suspension 30 mL  30 mL Oral Q4H PRN  [name], DO   30 mL at 09/20/22 1142   diphenhydrAMINE (BENADRYL) capsule 50 mg  50 mg Oral Q6H PRN [name], DO   50 mg at 09/29/22 2254   Or   diphenhydrAMINE (BENADRYL) injection 50 mg  50 mg Intramuscular Q6H PRN [name], DO       haloperidol (HALDOL) tablet 5 mg  5 mg Oral Q6H PRN [name], DO   5 mg at 09/28/22 2149   Or   haloperidol lactate (HALDOL) injection 5 mg  5 mg Intramuscular Q6H PRN [name], DO       ibuprofen (ADVIL) tablet 600 mg  600 mg Oral Q6H PRN [name], [name], MD   600 mg at 11/08/22 1012   LORazepam (ATIVAN) tablet 1 mg  1 mg Oral TID PRN [name], DO   1 mg at 09/27/22 0025   magnesium hydroxide (MILK OF MAGNESIA) suspension 30 mL  30 mL Oral Daily PRN [name], DO   30 mL at 11/08/22 1012   [START ON 11/13/2022] paliperidone (INVEGA SUSTENNA) injection 156 mg  156 mg Intramuscular Once [name], MD       paliperidone (INVEGA) 24 hr tablet 3 mg  3 mg Oral QHS [name], DO   3 mg at 11/09/22 2131   traZODone (DESYREL) tablet 50 mg  50 mg Oral QHS PRN [name], DO   50 mg at 11/09/22 2131    Lab Results: No results found for this or any previous visit (from the past 48 hour(s)).  Blood Alcohol level:  Lab Results  Component Value Date   ETH <10 08/17/2022   ETH <10 01/11/2021    Metabolic Disorder Labs: No results found for: "HGBA1C", "MPG" No results found for: "PROLACTIN" No results found for: "CHOL", "TRIG", "HDL", "CHOLHDL", "VLDL", "LDLCALC"    Musculoskeletal: Strength & Muscle Tone: within normal limits Gait & Station: normal Patient leans: N/A   Psychiatric Specialty Exam:   Presentation  General Appearance:  Casual; Neat   Eye Contact: Fair   Speech:  Less Garbled   Speech Volume: Normal   Handedness: Right     Mood and Affect  Mood: Fine   Affect: Stable    Thought  Processes: Probably at baseline. Improved since admission   Descriptions of Associations:Intact   Orientation: Patient is oriented to person, place, month, and year   Thought Content:Denies SI/HI,  at baseline   Hallucinations:Denies ,   Ideas of Reference:None   Suicidal Thoughts:Denies SI Homicidal Thoughts:Denies HI   Sensorium  Memory: Immediate Fair; Remote Poor   Judgment: Fair, pt is Rx compliant   Insight: Limited     Executive Functions  Concentration:  Improved   Attention Span: Improved   Language: Fair     Psychomotor Activity  Psychomotor Activity:Normal   Assets  Assets: Communication Skills     Sleep  Sleep:Improved     Physical Exam: Physical Exam Vitals and nursing note reviewed.  Constitutional:      Appearance: Normal appearance. She is normal weight.  Neurological:     General: No focal deficit present.     Mental Status: She is alert.      Review of Systems  Constitutional: Negative.   HENT: Negative.    Eyes: Negative.   Respiratory: Negative.    Cardiovascular: Negative.   Gastrointestinal: Negative.   Genitourinary: Negative.   Musculoskeletal: Negative.   Skin: Negative.   Neurological: Negative.   Endo/Heme/Allergies: Negative.     Blood pressure 118/81, pulse 67, temperature 98 F (36.7 C), resp. rate 20, height 5\' 6"  (1.676 m), weight 64 kg, SpO2 100%. Body mass index is 22.76 kg/m.   Treatment Plan Summary: Daily contact with patient to assess and evaluate symptoms and progress in treatment, Medication management, and Plan continue current medications.  Invega Sustenna 156 mg IM given, 10/15/22 to be repeated every 4 weeks. Continue on Invega 3 mg Po daily    [name], MD

## 2022-11-10 NOTE — Progress Notes (Signed)
Patient pleasant and cooperative on unit.  Observed responding to internal stimuli while in her bedroom.  Denies SI/HI.  Denies feelings of anxiety and depression. Good appetite.  Slept well.   No scheduled medications.  15 min checks in place for safety.  Patient is present in milieu for meals and occasionally to watch TV.  Minimal interaction with peers or staff.  Frequent verbal contacts made. Attended social work group.

## 2022-11-11 DIAGNOSIS — F250 Schizoaffective disorder, bipolar type: Secondary | ICD-10-CM | POA: Diagnosis not present

## 2022-11-11 NOTE — BHH Group Notes (Signed)
Adult Psychoeducational Group Note  Date:  11/11/2022 Time:  12:27 PM  Group Topic/Focus:  Goals Group:   The focus of this group is to help patients establish daily goals to achieve during treatment and discuss how the patient can incorporate goal setting into their daily lives to aide in recovery.  Participation Level:  Active  Participation Quality:  Appropriate  Affect:  Appropriate  Cognitive:  Appropriate  Insight: Appropriate  Engagement in Group:  Engaged  Modes of Intervention:  Discussion  Additional Comments:  MHT held group in courtyard. Pt attend group. Pt stated her goal is to find a place to stay. Pt named a place in [facility], that she stayed in previously. Pt stated not being able to live with son's. Pt stated just wanting to sit out on front porch or at window and watch the people.      Betty Henderson 11/11/2022, 12:27 PM

## 2022-11-11 NOTE — BHH Counselor (Signed)
CSW received fax transmission log stating received for both [facility] and [address]   [place], MSW, [place] 11/11/2022 2:23 PM

## 2022-11-11 NOTE — Plan of Care (Addendum)
D: Pt alert and oriented. Pt denies experiencing any depression or anxiety. Pt reports 8/10 left hip pain at this time, Prns given. Pt denies experiencing any SI/HI, or AVH at this time.   A: Support and encouragement provided. Frequent verbal contact made. Routine safety checks conducted q15 minutes.   R: Pt verbally contracts for safety at this time. Pt compliant with treatment plan. Pt interacts well but minimally with others on the unit. Pt remains safe at this time. Plan of care ongoing.  Problem: Coping: Goal: Level of anxiety will decrease Outcome: Progressing   Problem: Pain Managment: Goal: General experience of comfort will improve Outcome: Progressing

## 2022-11-11 NOTE — BHH Counselor (Addendum)
CSW faxed referral to [facility] [phone]) after not hearing back from them .   CSW faxed referral to [facility] ([phone])   CSW to call and confirm that they were received and check on status.  Reynaldo Minium, MSW, [place] 11/11/2022 1:45 PM

## 2022-11-11 NOTE — Group Note (Signed)
Recreation Therapy Group Note   Group Topic:Health and Wellness  Group Date: 11/11/2022 Start Time: 1400 End Time: 1445 Facilitators: Rosina Lowenstein, LRT, CTRS Location:  Day Room  Group Description: Seated Exercise. LRT discussed the mental and physical benefits of exercise. LRT and group discussed how physical activity can be used as a coping skill. Pt's and LRT followed along to an exercise video on the TV screen that provided a visual representation and audio description of every exercise performed. Pt's encouraged to listen to their bodies and stop at any time if they experience feelings of discomfort or pain. LRT passed out water after session was over and encouraged pts do drink and stay hydrated.  Goal Area(s) Addressed: Patient will learn benefits of physical activity. Patient will identify exercise as a coping skill.  Patient will follow multistep directions. Patient will try a new leisure interest.    Affect/Mood: Appropriate   Participation Level: Minimal   Participation Quality: Independent   Behavior: Calm and Cooperative   Speech/Thought Process: Coherent   Insight: Fair   Judgement: Fair    Modes of Intervention: Activity   Patient Response to Interventions:  Receptive   Education Outcome:  Acknowledges education   Clinical Observations/Individualized Feedback: Betty Henderson was active in their participation of session activities and group discussion. Pt came late to group and left early. She appropriately completed some of the exercises shown. Pt interacted well with LRT and peers duration of session.   Plan: Continue to engage patient in RT group sessions 2-3x/week.   Rosina Lowenstein, LRT, CTRS 11/11/2022 4:04 PM

## 2022-11-11 NOTE — Group Note (Signed)
Date:  11/11/2022 Time:  1:30 AM  Group Topic/Focus:  Self Care:   The focus of this group is to help patients understand the importance of self-care in order to improve or restore emotional, physical, spiritual, interpersonal, and financial health.    Participation Level:  Did Not Attend  Participation Quality:   Did Not Attend  Affect:   Did Not Attend  Cognitive:   Did Not Attend  Insight: None  Engagement in Group:  None  Modes of Intervention:  Socialization  Additional Comments:    [name] 11/11/2022, 1:30 AM

## 2022-11-11 NOTE — Plan of Care (Signed)
  Problem: Nutrition: Goal: Adequate nutrition will be maintained Outcome: Progressing   

## 2022-11-11 NOTE — Progress Notes (Addendum)
[facility] MD Progress Note  11/11/2022 Betty Henderson  MRN:  [id number]  Subjective: Case discussed with RN and social worker, chart reviewed, patient seen today during rounds.   No new acute events overnight.  Patient was pleasant and cooperative with assessment today. Patient continues to say that she can go to her son's house.  Patient was informed that her son has refused to allow patient to move in with him upon discharge.  Patient is awaiting placement.  Patient denies auditory or visual hallucinations.  Patient denies any thoughts of harming herself or others.No behavioral issues reported by the staff.  Per SW, caseworker at [facility] [name] states that the court hearing should be scheduled  within the next 14 days.  After the court hearing pt will be assigned a guardian through the state.  Principal Problem: Schizoaffective disorder, bipolar type (HCC) Diagnosis: Principal Problem:   Schizoaffective disorder, bipolar type (HCC)   Past Psychiatric History: Schizoaffective disorder, bipolar type.  Past Medical History:  Past Medical History:  Diagnosis Date   Anemia    Arthritis    Chronic pain    Drug-seeking behavior    Malingering    Osteopetrosis    Psychosis (HCC)    Schizoaffective disorder, bipolar type (HCC)     Past Surgical History:  Procedure Laterality Date   MOUTH SURGERY     TUBAL LIGATION     Family History:  Family History  Family history unknown: Yes   Family Psychiatric  History: Unremarkable Social History:  Social History   Substance and Sexual Activity  Alcohol Use Not Currently   Comment: 1 cocktail 3 weeks ago     Social History   Substance and Sexual Activity  Drug Use Not Currently   Types: Cocaine   Comment: states "it's legal though"    Social History   Socioeconomic History   Marital status: Single    Spouse name: Not on file   Number of children: Not on file   Years of education: Not on file   Highest  education level: Not on file  Occupational History   Not on file  Tobacco Use   Smoking status: Every Day    Current packs/day: 0.50    Types: Cigarettes   Smokeless tobacco: Never  Vaping Use   Vaping status: Never Used  Substance and Sexual Activity   Alcohol use: Not Currently    Comment: 1 cocktail 3 weeks ago   Drug use: Not Currently    Types: Cocaine    Comment: states "it's legal though"   Sexual activity: Never  Other Topics Concern   Not on file  Social History Narrative   Not on file   Social Determinants of Health   Financial Resource Strain: Not on file  Food Insecurity: Food Insecurity Present (08/22/2022)   Hunger Vital Sign    Worried About Running Out of Food in the Last Year: Sometimes true    Ran Out of Food in the Last Year: Sometimes true  Transportation Needs: Patient Unable To Answer (08/22/2022)   PRAPARE - Transportation    Lack of Transportation (Medical): Patient unable to answer    Lack of Transportation (Non-Medical): Patient unable to answer  Recent Concern: Transportation Needs - Unmet Transportation Needs (06/13/2022)   Received from [facility], [facility]   [facility] - Transportation    Lack of Transportation (Medical): Not on file    Lack of Transportation (Non-Medical): Yes  Physical Activity: Not on file  Stress: Not on file  Social Connections: Unknown (08/06/2021)   Received from [facility], [facility]   Social Network    Social Network: Not on file   Additional Social History:                         Sleep: Good  Appetite:  Good  Current Medications: Current Facility-Administered Medications  Medication Dose Route Frequency Provider Last Rate Last Admin   acetaminophen (TYLENOL) tablet 650 mg  650 mg Oral Q6H PRN [name], DO   650 mg at 11/09/22 1507   alum & mag hydroxide-simeth (MAALOX/MYLANTA) 200-200-20 MG/5ML suspension 30 mL  30 mL Oral Q4H PRN [name], DO   30 mL at  09/20/22 1142   diphenhydrAMINE (BENADRYL) capsule 50 mg  50 mg Oral Q6H PRN [name], DO   50 mg at 09/29/22 2254   Or   diphenhydrAMINE (BENADRYL) injection 50 mg  50 mg Intramuscular Q6H PRN [name], DO       haloperidol (HALDOL) tablet 5 mg  5 mg Oral Q6H PRN [name], DO   5 mg at 09/28/22 2149   Or   haloperidol lactate (HALDOL) injection 5 mg  5 mg Intramuscular Q6H PRN [name], DO       ibuprofen (ADVIL) tablet 600 mg  600 mg Oral Q6H PRN [name], [name], MD   600 mg at 11/11/22 0840   LORazepam (ATIVAN) tablet 1 mg  1 mg Oral TID PRN [name], DO   1 mg at 09/27/22 0025   magnesium hydroxide (MILK OF MAGNESIA) suspension 30 mL  30 mL Oral Daily PRN [name], DO   30 mL at 11/08/22 1012   [START ON 11/13/2022] paliperidone (INVEGA SUSTENNA) injection 156 mg  156 mg Intramuscular Once [name], MD       paliperidone (INVEGA) 24 hr tablet 3 mg  3 mg Oral QHS [name], DO   3 mg at 11/10/22 2158   traZODone (DESYREL) tablet 50 mg  50 mg Oral QHS PRN [name], DO   50 mg at 11/10/22 2158    Lab Results: No results found for this or any previous visit (from the past 48 hour(s)).  Blood Alcohol level:  Lab Results  Component Value Date   ETH <10 08/17/2022   ETH <10 01/11/2021    Metabolic Disorder Labs: No results found for: "HGBA1C", "MPG" No results found for: "PROLACTIN" No results found for: "CHOL", "TRIG", "HDL", "CHOLHDL", "VLDL", "LDLCALC"    Musculoskeletal: Strength & Muscle Tone: within normal limits Gait & Station: normal Patient leans: N/A   Psychiatric Specialty Exam:   Presentation  General Appearance:  Casual; Neat   Eye Contact: Fair   Speech: Spontaneous and less Garbled   Speech Volume: Normal   Handedness: Right     Mood and Affect  Mood: Fine   Affect: Stable    Thought Processes: Probably at baseline.  Improved since admission   Descriptions of Associations:Intact   Orientation: Patient is oriented to person, place, month, and year   Thought Content:Denies SI/HI,  at baseline   Hallucinations:Denies ,   Ideas of Reference:None   Suicidal Thoughts:Denies SI Homicidal Thoughts:Denies HI   Sensorium  Memory: Immediate Fair; Remote Poor   Judgment: Fair, pt is Rx compliant   Insight: Limited     Executive Functions  Concentration: Improved   Attention Span:  Improved   Language: Fair     Psychomotor Activity  Psychomotor Activity:Normal   Assets  Assets: Communication Skills     Sleep  Sleep:Improved     Physical Exam: Physical Exam Vitals and nursing note reviewed.  Constitutional:      Appearance: Normal appearance. She is normal weight.  Neurological:     General: No focal deficit present.     Mental Status: She is alert.      Review of Systems  Constitutional: Negative.   HENT: Negative.    Eyes: Negative.   Respiratory: Negative.    Cardiovascular: Negative.   Gastrointestinal: Negative.   Genitourinary: Negative.   Musculoskeletal: Joint pain  Skin: Negative.   Neurological: Negative.   Endo/Heme/Allergies: Negative.     Blood pressure 109/66, pulse 67, temperature 98.3 F (36.8 C), resp. rate 14, height 5\' 6"  (1.676 m), weight 64 kg, SpO2 97%. Body mass index is 22.76 kg/m.   Treatment Plan Summary: Daily contact with patient to assess and evaluate symptoms and progress in treatment, Medication management, and Plan continue current medications.  Invega Sustenna 156 mg IM given, 10/15/22 to be repeated every 4 weeks. Continue on Invega 3 mg Po daily    [name], MD

## 2022-11-11 NOTE — BHH Counselor (Signed)
CSW contacted [facility] in [place] [phone]. Unable to reach, LVM requesting return call.

## 2022-11-11 NOTE — Plan of Care (Signed)
  Problem: Nutrition: Goal: Adequate nutrition will be maintained Outcome: Progressing   Problem: Coping: Goal: Level of anxiety will decrease Outcome: Progressinc

## 2022-11-11 NOTE — Group Note (Signed)
Date:  11/11/2022 Time:  10:22 PM  Group Topic/Focus:  Wellness Toolbox:   The focus of this group is to discuss various aspects of wellness, balancing those aspects and exploring ways to increase the ability to experience wellness.  Patients will create a wellness toolbox for use upon discharge.    Participation Level:  Minimal  Participation Quality:  Appropriate  Affect:  Appropriate  Cognitive:  Alert  Insight: Improving  Engagement in Group:  Engaged  Modes of Intervention:  Discussion  Additional Comments:    [name] 11/11/2022, 10:22 PM

## 2022-11-11 NOTE — Progress Notes (Signed)
   11/11/22 2144  Psych Admission Type (Psych Patients Only)  Admission Status Voluntary  Psychosocial Assessment  Patient Complaints None  Eye Contact Brief  Facial Expression Flat  Affect Blunted  Speech Slurred  Interaction Minimal  Motor Activity Slow  Appearance/Hygiene Layered clothes  Behavior Characteristics Cooperative;Calm  Mood Pleasant  Thought Process  Coherency WDL  Content WDL  Delusions None reported or observed  Perception WDL  Hallucination None reported or observed  Judgment Limited  Confusion Mild  Danger to Self  Current suicidal ideation? Denies

## 2022-11-12 DIAGNOSIS — F250 Schizoaffective disorder, bipolar type: Secondary | ICD-10-CM | POA: Diagnosis not present

## 2022-11-12 NOTE — Plan of Care (Addendum)
D: Pt alert and oriented. Pt denies experiencing any anxiety/depression at this time. Pt denies experiencing any pain at this time. Pt denies experiencing any SI/HI, or AVH at this time.   A: Support and encouragement provided. Frequent verbal contact made. Routine safety checks conducted q15 minutes.   R: Pt verbally contracts for safety at this time. Pt compliant with treatment plan. Pt interacts well yet minimally with others on the unit. Pt remains safe at this time. Plan of care ongoing.  Problem: Nutrition: Goal: Adequate nutrition will be maintained Outcome: Progressing   Problem: Coping: Goal: Level of anxiety will decrease Outcome: Progressing

## 2022-11-12 NOTE — BH IP Treatment Plan (Signed)
Interdisciplinary Treatment and Diagnostic Plan Update  11/12/2022 Time of Session: 9:00 AM  Betty Henderson MRN: [id number]  Principal Diagnosis: Schizoaffective disorder, bipolar type (HCC)  Secondary Diagnoses: Principal Problem:   Schizoaffective disorder, bipolar type (HCC)   Current Medications:  Current Facility-Administered Medications  Medication Dose Route Frequency Provider Last Rate Last Admin   acetaminophen (TYLENOL) tablet 650 mg  650 mg Oral Q6H PRN [name], DO   650 mg at 11/09/22 1507   alum & mag hydroxide-simeth (MAALOX/MYLANTA) 200-200-20 MG/5ML suspension 30 mL  30 mL Oral Q4H PRN [name], DO   30 mL at 09/20/22 1142   diphenhydrAMINE (BENADRYL) capsule 50 mg  50 mg Oral Q6H PRN [name], DO   50 mg at 09/29/22 2254   Or   diphenhydrAMINE (BENADRYL) injection 50 mg  50 mg Intramuscular Q6H PRN [name], DO       haloperidol (HALDOL) tablet 5 mg  5 mg Oral Q6H PRN [name], DO   5 mg at 09/28/22 2149   Or   haloperidol lactate (HALDOL) injection 5 mg  5 mg Intramuscular Q6H PRN [name], DO       ibuprofen (ADVIL) tablet 600 mg  600 mg Oral Q6H PRN [name], [name], MD   600 mg at 11/11/22 0840   LORazepam (ATIVAN) tablet 1 mg  1 mg Oral TID PRN [name], DO   1 mg at 09/27/22 0025   magnesium hydroxide (MILK OF MAGNESIA) suspension 30 mL  30 mL Oral Daily PRN [name], DO   30 mL at 11/08/22 1012   [START ON 11/13/2022] paliperidone (INVEGA SUSTENNA) injection 156 mg  156 mg Intramuscular Once [name], MD       paliperidone (INVEGA) 24 hr tablet 3 mg  3 mg Oral QHS [name], DO   3 mg at 11/11/22 2112   traZODone (DESYREL) tablet 50 mg  50 mg Oral QHS PRN [name], DO   50 mg at 11/11/22 2112   PTA Medications: Medications Prior to Admission  Medication Sig Dispense Refill Last Dose   ondansetron  (ZOFRAN) 4 MG tablet Take 1 tablet (4 mg total) by mouth every 8 (eight) hours as needed for nausea or vomiting. (Patient not taking: Reported on 08/17/2022) 20 tablet 0     Patient Stressors: Financial difficulties   Health problems   Medication change or noncompliance    Patient Strengths: Ability for insight   Treatment Modalities: Medication Management, Group therapy, Case management,  1 to 1 session with clinician, Psychoeducation, Recreational therapy.   Physician Treatment Plan for Primary Diagnosis: Schizoaffective disorder, bipolar type (HCC) Long Term Goal(s): Improvement in symptoms so as ready for discharge   Short Term Goals: Ability to identify changes in lifestyle to reduce recurrence of condition will improve Ability to verbalize feelings will improve Ability to disclose and discuss suicidal ideas Ability to demonstrate self-control will improve Ability to identify and develop effective coping behaviors will improve Ability to maintain clinical measurements within normal limits will improve Compliance with prescribed medications will improve Ability to identify triggers associated with substance abuse/mental health issues will improve  Medication Management: Evaluate patient's response, side effects, and tolerance of medication regimen.  Therapeutic Interventions: 1 to 1 sessions, Unit Group sessions and Medication administration.  Evaluation of Outcomes: Progressing  Physician Treatment Plan for Secondary Diagnosis: Principal Problem:   Schizoaffective disorder, bipolar type (HCC)  Long Term Goal(s):  Improvement in symptoms so as ready for discharge   Short Term Goals: Ability to identify changes in lifestyle to reduce recurrence of condition will improve Ability to verbalize feelings will improve Ability to disclose and discuss suicidal ideas Ability to demonstrate self-control will improve Ability to identify and develop effective coping behaviors will  improve Ability to maintain clinical measurements within normal limits will improve Compliance with prescribed medications will improve Ability to identify triggers associated with substance abuse/mental health issues will improve     Medication Management: Evaluate patient's response, side effects, and tolerance of medication regimen.  Therapeutic Interventions: 1 to 1 sessions, Unit Group sessions and Medication administration.  Evaluation of Outcomes: Progressing   RN Treatment Plan for Primary Diagnosis: Schizoaffective disorder, bipolar type (HCC) Long Term Goal(s): Knowledge of disease and therapeutic regimen to maintain health will improve  Short Term Goals: Ability to remain free from injury will improve, Ability to verbalize frustration and anger appropriately will improve, Ability to demonstrate self-control, Ability to participate in decision making will improve, Ability to verbalize feelings will improve, Ability to disclose and discuss suicidal ideas, Ability to identify and develop effective coping behaviors will improve, and Compliance with prescribed medications will improve  Medication Management: RN will administer medications as ordered by provider, will assess and evaluate patient's response and provide education to patient for prescribed medication. RN will report any adverse and/or side effects to prescribing provider.  Therapeutic Interventions: 1 on 1 counseling sessions, Psychoeducation, Medication administration, Evaluate responses to treatment, Monitor vital signs and CBGs as ordered, Perform/monitor CIWA, COWS, AIMS and Fall Risk screenings as ordered, Perform wound care treatments as ordered.  Evaluation of Outcomes: Progressing   LCSW Treatment Plan for Primary Diagnosis: Schizoaffective disorder, bipolar type (HCC) Long Term Goal(s): Safe transition to appropriate next level of care at discharge, Engage patient in therapeutic group addressing interpersonal  concerns.  Short Term Goals: Engage patient in aftercare planning with referrals and resources, Increase social support, Increase ability to appropriately verbalize feelings, Increase emotional regulation, Facilitate acceptance of mental health diagnosis and concerns, and Increase skills for wellness and recovery  Therapeutic Interventions: Assess for all discharge needs, 1 to 1 time with Social worker, Explore available resources and support systems, Assess for adequacy in community support network, Educate family and significant other(s) on suicide prevention, Complete Psychosocial Assessment, Interpersonal group therapy.  Evaluation of Outcomes: Progressing   Progress in Treatment: Attending groups: Yes. Participating in groups: Yes. Taking medication as prescribed: Yes. Toleration medication: Yes. Family/Significant other contact made: Yes, individual(s) contacted:  Karren Burly Patient understands diagnosis: Yes. Discussing patient identified problems/goals with staff: Yes. Medical problems stabilized or resolved: Yes. Denies suicidal/homicidal ideation: Yes. Issues/concerns per patient self-inventory: No. Other: None   New Short Term/Long Term Goal(s): Update 6/30: none at this time. Update 09/27/2022:  No changes at this time.  Update 10/02/2022:  No changes at this time. Update 10/07/22: No changes at this time 10/12/22: No changes at this time Update 10/18/22: No changes at this time Update 10/23/22: No changes at this time Update 10/28/22: No changes at this time Update 11/07/22: No changes at this time Update 11/12/22: No changes at this time          Patient Goals:  Update 6/30: none at this time. Update 09/27/2022:  No changes at this time. Update 10/02/2022:  No changes at this time. Update 10/07/22: No changes at this time 10/12/22: No changes at this time Update 10/18/22: No changes at this time Update  10/23/22: No changes at this time Update 10/28/22: No changes at this time Update  11/07/22: No changes at this time Update 11/12/22: No changes at this time         Discharge Plan or Barriers: Update 6/30: APS report has been made and patient is being investigated for guardianship needs.  Remains homeless with limited supports. Update 09/27/2022:  No changes at this time.  Update 10/02/2022:  Patient remains safe on the unit at this time.  Patient remains psychotic at this time.  APS report has been made, however, no follow up from the caseworker on her case.  No safe discharge identified.  CSW has requested that application for Medicaid and disability be completed.   Update 10/07/22: No changes at this time 10/12/22: No changes at this time Update 10/18/22: No changes at this time Update 10/23/22: No changes at this time Update 10/23/22: No changes at this time Update 10/28/22: According to [facility], pt's caseworker at DSS, the petition was filed for guardianship on 10/21/22, now awaiting for the petition to be approvedUpdate 11/07/22: No changes at this time Update 11/12/22: CSW sent over patients information to [facility], awaiting a response. CSW awaiting word from DSS regarding pt's petition and what agency will become her guardian.     Reason for Continuation of Hospitalization: Hallucinations Mania Medication stabilization   Estimated Length of Stay:  Update 6/30: 1-7 days Update 09/27/2022:  TBD Update 10/02/2022:  No changes at this time. [place] 10/18/22: No changes at this time te 10/07/22: No changes at this time Update 10/18/22: No changes at this time Update 10/23/22: No changes at this time Update 10/28/22: No changes at this time Update 11/07/22: No changes at this time Update 11/12/22: No changes at this time     Last 3 [place] Suicide Severity Risk Score: Flowsheet Row Admission (Current) from 08/22/2022 in [facility] ED from 08/17/2022 in [facility] Emergency Department at [facility] ED from 08/12/2022 in [facility] Emergency  Department at [facility]  C-SSRS RISK CATEGORY Low Risk No Risk No Risk       Last [facility] 2/9 Scores:     No data to display          Scribe for Treatment Team: Elza Rafter, Theresia Majors 11/12/2022 12:59 PM

## 2022-11-12 NOTE — Group Note (Signed)
Date:  11/12/2022 Time:  10:39 PM  Group Topic/Focus:  Healthy Communication:   The focus of this group is to discuss communication, barriers to communication, as well as healthy ways to communicate with others.    Participation Level:  Active  Participation Quality:  Appropriate  Affect:  Appropriate  Cognitive:  Oriented  Insight: Good  Engagement in Group:  Engaged  Modes of Intervention:  Discussion  Additional Comments:    [name] 11/12/2022, 10:39 PM

## 2022-11-12 NOTE — Progress Notes (Signed)
   11/12/22 2214  Psych Admission Type (Psych Patients Only)  Admission Status Voluntary  Psychosocial Assessment  Patient Complaints None  Eye Contact Fair  Facial Expression Animated  Affect Appropriate to circumstance  Speech Slurred  Interaction Minimal;Assertive  Motor Activity Slow;Shuffling  Appearance/Hygiene Layered clothes  Behavior Characteristics Cooperative;Calm  Mood Pleasant  Thought Process  Coherency WDL  Content WDL  Delusions None reported or observed  Perception WDL  Hallucination None reported or observed  Judgment Limited  Confusion Mild  Danger to Self  Current suicidal ideation? Denies

## 2022-11-12 NOTE — Group Note (Signed)
Recreation Therapy Group Note   Group Topic:General Recreation  Group Date: 11/12/2022 Start Time: 1400 End Time: 1500 Facilitators: Rosina Lowenstein, LRT, CTRS Location: Courtyard  Group Description: Outdoor Recreation. Patients had the option to play ring toss, corn hole or sit and listen to music while outside in the courtyard getting fresh air and sunlight. LRT and pts discussed things that they enjoy doing in their free time outside of the hospital.  Goal Area(s) Addressed: Patient will identify leisure interests.  Patient will practice healthy decision making. Patient will engage in recreation activity.   Affect/Mood: Flat   Participation Level: Moderate   Participation Quality: Independent   Behavior: Calm and On-looking   Speech/Thought Process: Coherent   Insight: Fair   Judgement: Good   Modes of Intervention: Activity   Patient Response to Interventions:  Receptive   Education Outcome:  Acknowledges education   Clinical Observations/Individualized Feedback: Betty Henderson was somewhat active in their participation of session activities and group discussion. Pt chose to sit and listen to music while outside. Pt minimally interacted with LRT and peers and seemed down and more withdrawn than in previous interactions. LRT asked if she was okay and pt shook her head up and down.   Plan: Continue to engage patient in RT group sessions 2-3x/week.   Rosina Lowenstein, LRT, CTRS 11/12/2022 3:07 PM

## 2022-11-12 NOTE — Plan of Care (Signed)
  Problem: Health Behavior/Discharge Planning: Goal: Ability to manage health-related needs will improve Outcome: Progressing   Problem: Clinical Measurements: Goal: Ability to maintain clinical measurements within normal limits will improve Outcome: Progressing   Problem: Nutrition: Goal: Adequate nutrition will be maintained Outcome: Progressing   

## 2022-11-12 NOTE — Progress Notes (Signed)
[facility] MD Progress Note  11/12/2022 12:13 PM Betty Henderson  MRN:  [id number] Subjective: Betty Henderson is seen on rounds.  She continues on [place] and is doing well on it.  Still waiting to hear from DSS and placement.  She has no complaints.  Nurses report no issues. Principal Problem: Schizoaffective disorder, bipolar type (HCC) Diagnosis: Principal Problem:   Schizoaffective disorder, bipolar type (HCC)  Total Time spent with patient: 15 minutes  Past Psychiatric History: Schizoaffective disorder, bipolar type  Past Medical History:  Past Medical History:  Diagnosis Date   Anemia    Arthritis    Chronic pain    Drug-seeking behavior    Malingering    Osteopetrosis    Psychosis (HCC)    Schizoaffective disorder, bipolar type (HCC)     Past Surgical History:  Procedure Laterality Date   MOUTH SURGERY     TUBAL LIGATION     Family History:  Family History  Family history unknown: Yes   Family Psychiatric  History: Unremarkable Social History:  Social History   Substance and Sexual Activity  Alcohol Use Not Currently   Comment: 1 cocktail 3 weeks ago     Social History   Substance and Sexual Activity  Drug Use Not Currently   Types: Cocaine   Comment: states "it's legal though"    Social History   Socioeconomic History   Marital status: Single    Spouse name: Not on file   Number of children: Not on file   Years of education: Not on file   Highest education level: Not on file  Occupational History   Not on file  Tobacco Use   Smoking status: Every Day    Current packs/day: 0.50    Types: Cigarettes   Smokeless tobacco: Never  Vaping Use   Vaping status: Never Used  Substance and Sexual Activity   Alcohol use: Not Currently    Comment: 1 cocktail 3 weeks ago   Drug use: Not Currently    Types: Cocaine    Comment: states "it's legal though"   Sexual activity: Never  Other Topics Concern   Not on file  Social History Narrative   Not on file   Social  Determinants of Health   Financial Resource Strain: Not on file  Food Insecurity: Food Insecurity Present (08/22/2022)   Hunger Vital Sign    Worried About Running Out of Food in the Last Year: Sometimes true    Ran Out of Food in the Last Year: Sometimes true  Transportation Needs: Patient Unable To Answer (08/22/2022)   PRAPARE - Transportation    Lack of Transportation (Medical): Patient unable to answer    Lack of Transportation (Non-Medical): Patient unable to answer  Recent Concern: Transportation Needs - Unmet Transportation Needs (06/13/2022)   Received from [facility], [facility]   [facility] - Transportation    Lack of Transportation (Medical): Not on file    Lack of Transportation (Non-Medical): Yes  Physical Activity: Not on file  Stress: Not on file  Social Connections: Unknown (08/06/2021)   Received from [facility], [facility]   Social Network    Social Network: Not on file   Additional Social History:                         Sleep: Good  Appetite:  Good  Current Medications: Current Facility-Administered Medications  Medication Dose Route Frequency Provider Last Rate Last Admin   acetaminophen (TYLENOL) tablet  650 mg  650 mg Oral Q6H PRN [name], DO   650 mg at 11/09/22 1507   alum & mag hydroxide-simeth (MAALOX/MYLANTA) 200-200-20 MG/5ML suspension 30 mL  30 mL Oral Q4H PRN [name], DO   30 mL at 09/20/22 1142   diphenhydrAMINE (BENADRYL) capsule 50 mg  50 mg Oral Q6H PRN [name], DO   50 mg at 09/29/22 2254   Or   diphenhydrAMINE (BENADRYL) injection 50 mg  50 mg Intramuscular Q6H PRN [name], DO       haloperidol (HALDOL) tablet 5 mg  5 mg Oral Q6H PRN [name], DO   5 mg at 09/28/22 2149   Or   haloperidol lactate (HALDOL) injection 5 mg  5 mg Intramuscular Q6H PRN [name], DO       ibuprofen (ADVIL) tablet 600 mg  600 mg Oral Q6H PRN [name], [name], MD   600 mg at 11/11/22 0840   LORazepam (ATIVAN) tablet 1 mg  1 mg Oral TID PRN [name], DO   1 mg at 09/27/22 0025   magnesium hydroxide (MILK OF MAGNESIA) suspension 30 mL  30 mL Oral Daily PRN [name], DO   30 mL at 11/08/22 1012   [START ON 11/13/2022] paliperidone (INVEGA SUSTENNA) injection 156 mg  156 mg Intramuscular Once [name], MD       paliperidone (INVEGA) 24 hr tablet 3 mg  3 mg Oral QHS [name], DO   3 mg at 11/11/22 2112   traZODone (DESYREL) tablet 50 mg  50 mg Oral QHS PRN [name], DO   50 mg at 11/11/22 2112    Lab Results: No results found for this or any previous visit (from the past 48 hour(s)).  Blood Alcohol level:  Lab Results  Component Value Date   ETH <10 08/17/2022   ETH <10 01/11/2021    Metabolic Disorder Labs: No results found for: "HGBA1C", "MPG" No results found for: "PROLACTIN" No results found for: "CHOL", "TRIG", "HDL", "CHOLHDL", "VLDL", "LDLCALC"  Physical Findings: AIMS:  , ,  ,  ,    CIWA:    COWS:     Musculoskeletal: Strength & Muscle Tone: within normal limits Gait & Station: normal Patient leans: N/A  Psychiatric Specialty Exam:  Presentation  General Appearance:  Casual; Neat  Eye Contact: Fair  Speech: Slow; Slurred  Speech Volume: Decreased  Handedness: Right   Mood and Affect  Mood: Anxious  Affect: Inappropriate   Thought Process  Thought Processes: Disorganized  Descriptions of Associations:Loose  Orientation:Partial  Thought Content:Illogical; Rumination  History of Schizophrenia/Schizoaffective disorder:No data recorded Duration of Psychotic Symptoms:No data recorded Hallucinations:No data recorded Ideas of Reference:Paranoia  Suicidal Thoughts:No data recorded Homicidal Thoughts:No data recorded  Sensorium  Memory: Immediate Fair; Remote Poor  Judgment: Poor  Insight: Poor   Executive Functions   Concentration: Poor  Attention Span: Fair  Recall: [employer] of Knowledge: Fair  Language: Fair   Psychomotor Activity  Psychomotor Activity:No data recorded  Assets  Assets: Communication Skills   Sleep  Sleep:No data recorded    Blood pressure 110/61, pulse 76, temperature 97.6 F (36.4 C), resp. rate 18, height 5\' 6"  (1.676 m), weight 64 kg, SpO2 96%. Body mass index is 22.76 kg/m.   Treatment Plan Summary: Daily contact with patient to assess and evaluate symptoms and progress in treatment, Medication management, and Plan continue current medications.  Pranish Akhavan  Tresea Mall, DO 11/12/2022, 12:13 PM

## 2022-11-13 DIAGNOSIS — F250 Schizoaffective disorder, bipolar type: Secondary | ICD-10-CM | POA: Diagnosis not present

## 2022-11-13 NOTE — Progress Notes (Signed)
[facility] MD Progress Note  11/13/2022 11:54 AMDebra Starlet Henderson  MRN:  [id number] Subjective:  Betty Henderson was seen on rounds.  She has been in good controls.  DSS social work continue to look for placement.  She denies any side effects from her medications.  Nurses report no issues.  She has been very pleasant.  She is on Invega at bedtime and she received Invega long-acting injection which will be due on August 23. Principal Problem: Schizoaffective disorder, bipolar type (HCC) Diagnosis: Principal Problem:   Schizoaffective disorder, bipolar type (HCC)  Total Time spent with patient: 15 minutes  Past Psychiatric History: Schizoaffective disorder bipolar type  Past Medical History:  Past Medical History:  Diagnosis Date   Anemia    Arthritis    Chronic pain    Drug-seeking behavior    Malingering    Osteopetrosis    Psychosis (HCC)    Schizoaffective disorder, bipolar type (HCC)     Past Surgical History:  Procedure Laterality Date   MOUTH SURGERY     TUBAL LIGATION     Family History:  Family History  Family history unknown: Yes   Family Psychiatric  History: Unremarkable Social History:  Social History   Substance and Sexual Activity  Alcohol Use Not Currently   Comment: 1 cocktail 3 weeks ago     Social History   Substance and Sexual Activity  Drug Use Not Currently   Types: Cocaine   Comment: states "it's legal though"    Social History   Socioeconomic History   Marital status: Single    Spouse name: Not on file   Number of children: Not on file   Years of education: Not on file   Highest education level: Not on file  Occupational History   Not on file  Tobacco Use   Smoking status: Every Day    Current packs/day: 0.50    Types: Cigarettes   Smokeless tobacco: Never  Vaping Use   Vaping status: Never Used  Substance and Sexual Activity   Alcohol use: Not Currently    Comment: 1 cocktail 3 weeks ago   Drug use: Not Currently    Types: Cocaine     Comment: states "it's legal though"   Sexual activity: Never  Other Topics Concern   Not on file  Social History Narrative   Not on file   Social Determinants of Health   Financial Resource Strain: Not on file  Food Insecurity: Food Insecurity Present (08/22/2022)   Hunger Vital Sign    Worried About Running Out of Food in the Last Year: Sometimes true    Ran Out of Food in the Last Year: Sometimes true  Transportation Needs: Patient Unable To Answer (08/22/2022)   PRAPARE - Transportation    Lack of Transportation (Medical): Patient unable to answer    Lack of Transportation (Non-Medical): Patient unable to answer  Recent Concern: Transportation Needs - Unmet Transportation Needs (06/13/2022)   Received from [facility], [facility]   [facility] - Transportation    Lack of Transportation (Medical): Not on file    Lack of Transportation (Non-Medical): Yes  Physical Activity: Not on file  Stress: Not on file  Social Connections: Unknown (08/06/2021)   Received from [facility], [facility]   Social Network    Social Network: Not on file   Additional Social History:                         Sleep:  Good  Appetite:  Good  Current Medications: Current Facility-Administered Medications  Medication Dose Route Frequency Provider Last Rate Last Admin   acetaminophen (TYLENOL) tablet 650 mg  650 mg Oral Q6H PRN [name], DO   650 mg at 11/09/22 1507   alum & mag hydroxide-simeth (MAALOX/MYLANTA) 200-200-20 MG/5ML suspension 30 mL  30 mL Oral Q4H PRN [name], DO   30 mL at 09/20/22 1142   diphenhydrAMINE (BENADRYL) capsule 50 mg  50 mg Oral Q6H PRN [name], DO   50 mg at 09/29/22 2254   Or   diphenhydrAMINE (BENADRYL) injection 50 mg  50 mg Intramuscular Q6H PRN [name], DO       haloperidol (HALDOL) tablet 5 mg  5 mg Oral Q6H PRN [name], DO   5 mg at 09/28/22 2149   Or   haloperidol lactate  (HALDOL) injection 5 mg  5 mg Intramuscular Q6H PRN [name], DO       ibuprofen (ADVIL) tablet 600 mg  600 mg Oral Q6H PRN [name], [name], MD   600 mg at 11/11/22 0840   LORazepam (ATIVAN) tablet 1 mg  1 mg Oral TID PRN [name], DO   1 mg at 09/27/22 0025   magnesium hydroxide (MILK OF MAGNESIA) suspension 30 mL  30 mL Oral Daily PRN [name], DO   30 mL at 11/08/22 1012   paliperidone (INVEGA) 24 hr tablet 3 mg  3 mg Oral QHS [name], DO   3 mg at 11/12/22 2149   traZODone (DESYREL) tablet 50 mg  50 mg Oral QHS PRN [name], DO   50 mg at 11/12/22 2149    Lab Results: No results found for this or any previous visit (from the past 48 hour(s)).  Blood Alcohol level:  Lab Results  Component Value Date   ETH <10 08/17/2022   ETH <10 01/11/2021    Metabolic Disorder Labs: No results found for: "HGBA1C", "MPG" No results found for: "PROLACTIN" No results found for: "CHOL", "TRIG", "HDL", "CHOLHDL", "VLDL", "LDLCALC"  Physical Findings: AIMS:  , ,  ,  ,    CIWA:    COWS:     Musculoskeletal: Strength & Muscle Tone: within normal limits Gait & Station: normal Patient leans: N/A  Psychiatric Specialty Exam:  Presentation  General Appearance:  Casual; Neat  Eye Contact: Fair  Speech: Slow; Slurred  Speech Volume: Decreased  Handedness: Right   Mood and Affect  Mood: Anxious  Affect: Inappropriate   Thought Process  Thought Processes: Disorganized  Descriptions of Associations:Loose  Orientation:Partial  Thought Content:Illogical; Rumination  History of Schizophrenia/Schizoaffective disorder:No data recorded Duration of Psychotic Symptoms:No data recorded Hallucinations:No data recorded Ideas of Reference:Paranoia  Suicidal Thoughts:No data recorded Homicidal Thoughts:No data recorded  Sensorium  Memory: Immediate Fair; Remote  Poor  Judgment: Poor  Insight: Poor   Executive Functions  Concentration: Poor  Attention Span: Fair  Recall: [employer] of Knowledge: Fair  Language: Fair   Psychomotor Activity  Psychomotor Activity:No data recorded  Assets  Assets: Communication Skills   Sleep  Sleep:No data recorded    Blood pressure (!) 98/57, pulse 74, temperature 97.6 F (36.4 C), resp. rate 15, height 5\' 6"  (1.676 m), weight 64 kg, SpO2 99%. Body mass index is 22.76 kg/m.   Treatment Plan Summary: Daily contact with patient to assess and evaluate symptoms and progress in treatment, Medication management, and Plan continue  current medications.  [name], DO 11/13/2022, 11:54 AM

## 2022-11-13 NOTE — Plan of Care (Signed)
  Problem: Clinical Measurements: Goal: Ability to maintain clinical measurements within normal limits will improve Outcome: Progressing   Problem: Nutrition: Goal: Adequate nutrition will be maintained Outcome: Progressing   Problem: Coping: Goal: Level of anxiety will decrease Outcome: Progressing   

## 2022-11-13 NOTE — Group Note (Signed)
Date:  11/13/2022 Time:  2:07 PM  Group Topic/Focus:  Building Self Esteem:   The Focus of this group is helping patients become aware of the effects of self-esteem on their lives, the things they and others do that enhance or undermine their self-esteem, seeing the relationship between their level of self-esteem and the choices they make and learning ways to enhance self-esteem. Goals Group:   The focus of this group is to help patients establish daily goals to achieve during treatment and discuss how the patient can incorporate goal setting into their daily lives to aide in recovery.    Participation Level:  Active  Participation Quality:  Appropriate and Attentive  Affect:  Appropriate  Cognitive:  Alert  Insight: Good  Engagement in Group:  Supportive  Modes of Intervention:  Activity  Additional Comments:    Alexis Frock 11/13/2022, 2:07 PM

## 2022-11-13 NOTE — Group Note (Signed)
Recreation Therapy Group Note   Group Topic:Relaxation  Group Date: 11/13/2022 Start Time: 1400 End Time: 1445 Facilitators: Rosina Lowenstein, LRT, CTRS Location: Courtyard  Group Description: Meditation. LRT and patients discussed what they know about meditation and mindfulness. LRT played a Deep Breathing Meditation exercise script for patients to follow along to. LRT and patients discussed how meditation and deep breathing can be used as a coping skill post--discharge. After the meditation, LRT took song requests from pts to listen to while getting fresh air and sunlight.    Goal Area(s) Addressed: Patient will practice using relaxation technique. Patient will identify a new coping skill.  Patient will follow multistep directions to reduce anxiety and stress.   Affect/Mood: Appropriate and Flat   Participation Level: Active and Engaged   Participation Quality: Independent   Behavior: Appropriate, Calm, and Cooperative   Speech/Thought Process: Coherent   Insight: Good   Judgement: Good   Modes of Intervention: Activity   Patient Response to Interventions:  Attentive, Engaged, and Receptive   Education Outcome:  Acknowledges education   Clinical Observations/Individualized Feedback: Maura was active in their participation of session activities and group discussion. Pt appropriately followed along to the meditation prompt. Pt requested "love and happiness" by Sedalia Muta afterwards. Pt interacted well with LRT and peers duration of session.   Plan: Continue to engage patient in RT group sessions 2-3x/week.   Rosina Lowenstein, LRT, CTRS 11/13/2022 3:03 PM

## 2022-11-13 NOTE — Plan of Care (Signed)
Patient denies SI/HI/VH can be heard responding in her room compliant with medications. No adverse effects noted. Interacting well with Games developer. Visible in Milieu this shift. Q 15 minutes safety checks ongoing.   Problem: Education: Goal: Knowledge of General Education information will improve Description: Including pain rating scale, medication(s)/side effects and non-pharmacologic comfort measures Outcome: Progressing   Problem: Health Behavior/Discharge Planning: Goal: Ability to manage health-related needs will improve Outcome: Progressing   Problem: Clinical Measurements: Goal: Ability to maintain clinical measurements within normal limits will improve Outcome: Progressing Goal: Diagnostic test results will improve Outcome: Progressing   Problem: Nutrition: Goal: Adequate nutrition will be maintained Outcome: Progressing   Problem: Coping: Goal: Level of anxiety will decrease Outcome: Progressing   Problem: Skin Integrity: Goal: Risk for impaired skin integrity will decrease Outcome: Progressing   Problem: Safety: Goal: Ability to remain free from injury will improve Outcome: Progressing   Problem: Pain Managment: Goal: General experience of comfort will improve Outcome: Progressing   Problem: Elimination: Goal: Will not experience complications related to bowel motility Outcome: Progressing Goal: Will not experience complications related to urinary retention Outcome: Progressing   Problem: Coping: Goal: Level of anxiety will decrease Outcome: Progressing

## 2022-11-13 NOTE — Progress Notes (Addendum)
   11/13/22 0803  Psych Admission Type (Psych Patients Only)  Admission Status Voluntary  Psychosocial Assessment  Patient Complaints None  Eye Contact Fair  Facial Expression Animated  Affect Appropriate to circumstance  Speech Slurred  Interaction Minimal;Assertive  Motor Activity Slow  Appearance/Hygiene Layered clothes  Behavior Characteristics Cooperative;Appropriate to situation  Mood Pleasant  Thought Process  Coherency WDL  Content WDL  Delusions None reported or observed  Perception WDL  Hallucination None reported or observed  Judgment Limited  Confusion Mild  Danger to Self  Current suicidal ideation? Denies  Agreement Not to Harm Self Yes  Description of Agreement Verbal  Danger to Others  Danger to Others None reported or observed  Danger to Others Abnormal  Harmful Behavior to others No threats or harm toward other people  Destructive Behavior No threats or harm toward property   Patient is alert and oriented. Patient is pleasant and cooperative.  Patient received Invega 156 MG inj into right gluteus.  Patient tolerated without incident.  Site is benign.  Patient continues on Q 15 minute safety checks.

## 2022-11-13 NOTE — BHH Counselor (Signed)
CSW contacted Risa Grill, financial navigator regarding the status of pt's Social Security and Medicaid applications   Riva, MSW, [facility] 11/13/2022 3:13 PM

## 2022-11-14 DIAGNOSIS — F250 Schizoaffective disorder, bipolar type: Secondary | ICD-10-CM | POA: Diagnosis not present

## 2022-11-14 NOTE — Progress Notes (Signed)
Patient calm and cooperative.  Present in the dayroom for breakfast.  Denies SI/HI and AVH.  Denies anxiety and depression.  Denies pain.  Isolates to room with exception of meals.  Declined to attend MHT group.  Patient responding to internal stimuli in both her bedroom and dayroom this afternoon.  Patient can be verbally re-directed.  Participated in RT group.  No scheduled medications on this shift. 15 min checks in place.  Frequent verbal contact.

## 2022-11-14 NOTE — Plan of Care (Signed)
Problem: Education: Goal: Knowledge of General Education information will improve Description Including pain rating scale, medication(s)/side effects and non-pharmacologic comfort measures Outcome: Progressing   Problem: Nutrition: Goal: Adequate nutrition will be maintained Outcome: Progressing   Problem: Coping: Goal: Level of anxiety will decrease Outcome: Progressing   Problem: Skin Integrity: Goal: Risk for impaired skin integrity will decrease Outcome: Progressing   

## 2022-11-14 NOTE — Progress Notes (Signed)
[facility] MD Progress Note  11/14/2022 2:04 PM Betty Henderson  MRN:  [id number] Subjective: Betty Henderson was seen on rounds.  No changes.  She has been compliant with medications.  She has been in good controls.  Social work continues to look for placement.  Nurses report no issues. Principal Problem: Schizoaffective disorder, bipolar type (HCC) Diagnosis: Principal Problem:   Schizoaffective disorder, bipolar type (HCC)  Total Time spent with patient: 15 minutes  Past Psychiatric History: Long history of schizoaffective disorder  Past Medical History:  Past Medical History:  Diagnosis Date   Anemia    Arthritis    Chronic pain    Drug-seeking behavior    Malingering    Osteopetrosis    Psychosis (HCC)    Schizoaffective disorder, bipolar type (HCC)     Past Surgical History:  Procedure Laterality Date   MOUTH SURGERY     TUBAL LIGATION     Family History:  Family History  Family history unknown: Yes   Family Psychiatric  History: Unremarkable Social History:  Social History   Substance and Sexual Activity  Alcohol Use Not Currently   Comment: 1 cocktail 3 weeks ago     Social History   Substance and Sexual Activity  Drug Use Not Currently   Types: Cocaine   Comment: states "it's legal though"    Social History   Socioeconomic History   Marital status: Single    Spouse name: Not on file   Number of children: Not on file   Years of education: Not on file   Highest education level: Not on file  Occupational History   Not on file  Tobacco Use   Smoking status: Every Day    Current packs/day: 0.50    Types: Cigarettes   Smokeless tobacco: Never  Vaping Use   Vaping status: Never Used  Substance and Sexual Activity   Alcohol use: Not Currently    Comment: 1 cocktail 3 weeks ago   Drug use: Not Currently    Types: Cocaine    Comment: states "it's legal though"   Sexual activity: Never  Other Topics Concern   Not on file  Social History Narrative   Not on  file   Social Determinants of Health   Financial Resource Strain: Not on file  Food Insecurity: Food Insecurity Present (08/22/2022)   Hunger Vital Sign    Worried About Running Out of Food in the Last Year: Sometimes true    Ran Out of Food in the Last Year: Sometimes true  Transportation Needs: Patient Unable To Answer (08/22/2022)   PRAPARE - Transportation    Lack of Transportation (Medical): Patient unable to answer    Lack of Transportation (Non-Medical): Patient unable to answer  Recent Concern: Transportation Needs - Unmet Transportation Needs (06/13/2022)   Received from [facility], [facility]   [facility] - [facility]    Lack of Transportation (Medical): Not on file    Lack of Transportation (Non-Medical): Yes  Physical Activity: Not on file  Stress: Not on file  Social Connections: Unknown (08/06/2021)   Received from [facility], [facility]   Social Network    Social Network: Not on file   Additional Social History:                         Sleep: Good  Appetite:  Good  Current Medications: Current Facility-Administered Medications  Medication Dose Route Frequency Provider Last Rate Last Admin   acetaminophen (TYLENOL)  tablet 650 mg  650 mg Oral Q6H PRN [name], DO   650 mg at 11/13/22 2119   alum & mag hydroxide-simeth (MAALOX/MYLANTA) 200-200-20 MG/5ML suspension 30 mL  30 mL Oral Q4H PRN [name], DO   30 mL at 09/20/22 1142   diphenhydrAMINE (BENADRYL) capsule 50 mg  50 mg Oral Q6H PRN [name], DO   50 mg at 09/29/22 2254   Or   diphenhydrAMINE (BENADRYL) injection 50 mg  50 mg Intramuscular Q6H PRN [name], DO       haloperidol (HALDOL) tablet 5 mg  5 mg Oral Q6H PRN [name], DO   5 mg at 09/28/22 2149   Or   haloperidol lactate (HALDOL) injection 5 mg  5 mg Intramuscular Q6H PRN [name], DO       ibuprofen (ADVIL) tablet 600 mg  600 mg Oral Q6H  PRN [name], [name], MD   600 mg at 11/11/22 0840   LORazepam (ATIVAN) tablet 1 mg  1 mg Oral TID PRN [name], DO   1 mg at 09/27/22 0025   magnesium hydroxide (MILK OF MAGNESIA) suspension 30 mL  30 mL Oral Daily PRN [name], DO   30 mL at 11/08/22 1012   paliperidone (INVEGA) 24 hr tablet 3 mg  3 mg Oral QHS [name], DO   3 mg at 11/13/22 2119   traZODone (DESYREL) tablet 50 mg  50 mg Oral QHS PRN [name], DO   50 mg at 11/13/22 2119    Lab Results: No results found for this or any previous visit (from the past 48 hour(s)).  Blood Alcohol level:  Lab Results  Component Value Date   ETH <10 08/17/2022   ETH <10 01/11/2021    Metabolic Disorder Labs: No results found for: "HGBA1C", "MPG" No results found for: "PROLACTIN" No results found for: "CHOL", "TRIG", "HDL", "CHOLHDL", "VLDL", "LDLCALC"  Physical Findings: AIMS:  , ,  ,  ,    CIWA:    COWS:     Musculoskeletal: Strength & Muscle Tone: within normal limits Gait & Station: normal Patient leans: N/A  Psychiatric Specialty Exam:  Presentation  General Appearance:  Casual; Neat  Eye Contact: Fair  Speech: Slow; Slurred  Speech Volume: Decreased  Handedness: Right   Mood and Affect  Mood: Anxious  Affect: Inappropriate   Thought Process  Thought Processes: Disorganized  Descriptions of Associations:Loose  Orientation:Partial  Thought Content:Illogical; Rumination  History of Schizophrenia/Schizoaffective disorder:No data recorded Duration of Psychotic Symptoms:No data recorded Hallucinations:No data recorded Ideas of Reference:Paranoia  Suicidal Thoughts:No data recorded Homicidal Thoughts:No data recorded  Sensorium  Memory: Immediate Fair; Remote Poor  Judgment: Poor  Insight: Poor   Executive Functions  Concentration: Poor  Attention Span: Fair  Recall: [employer] of  Knowledge: Fair  Language: Fair   Psychomotor Activity  Psychomotor Activity:No data recorded  Assets  Assets: Communication Skills   Sleep  Sleep:No data recorded   Physical Exam: Physical Exam Vitals and nursing note reviewed.  Constitutional:      Appearance: Normal appearance. She is normal weight.  Neurological:     General: No focal deficit present.     Mental Status: She is alert and oriented to person, place, and time.  Psychiatric:        Mood and Affect: Mood normal.        Behavior: Behavior normal.    Review of  Systems  Constitutional: Negative.   HENT: Negative.    Eyes: Negative.   Respiratory: Negative.    Cardiovascular: Negative.   Gastrointestinal: Negative.   Genitourinary: Negative.   Musculoskeletal: Negative.   Skin: Negative.   Neurological: Negative.   Endo/Heme/Allergies: Negative.   Psychiatric/Behavioral: Negative.     Blood pressure 122/74, pulse 69, temperature 97.8 F (36.6 C), resp. rate 18, height 5\' 6"  (1.676 m), weight 64 kg, SpO2 100%. Body mass index is 22.76 kg/m.   Treatment Plan Summary: Daily contact with patient to assess and evaluate symptoms and progress in treatment, Medication management, and Plan continue Sherwood Gambler, DO 11/14/2022, 2:04 PM

## 2022-11-14 NOTE — Plan of Care (Signed)
  Problem: Clinical Measurements: Goal: Ability to maintain clinical measurements within normal limits will improve Outcome: Progressing Goal: Diagnostic test results will improve Outcome: Progressing  Ms Betty Henderson had a good evening she interacted well with Peers and Staff stayed in Milieu compliant with medications no adverse effects noted.  Denies SI/HI/VH Prn Tylenol for back pain 5/10 given at HS and reported effective. Patient talked about her two boys and missing her grandson this evening. Q 15 minutes safety checks ongoing Patient remains safe.

## 2022-11-14 NOTE — Group Note (Signed)
Date:  11/14/2022 Time:  10:25 AM  Group Topic/Focus:  Coping With Mental Health Crisis:   The purpose of this group is to help patients identify strategies for coping with mental health crisis.  Group discusses possible causes of crisis and ways to manage them effectively.    Participation Level:  Did Not Attend  Participation Quality:    Affect:    Cognitive:    Insight:   Engagement in Group:    Modes of Intervention:    Additional Comments:    Xaidyn Kepner 11/14/2022, 10:25 AM

## 2022-11-14 NOTE — Group Note (Signed)
Date:  11/14/2022 Time:  6:25 AM  Group Topic/Focus:  Wrap-Up Group:   The focus of this group is to help patients review their daily goal of treatment and discuss progress on daily workbooks.    Participation Level:  Minimal  Participation Quality:  Appropriate  Affect:  Flat  Cognitive:  Alert  Insight: Limited  Engagement in Group:  Limited  Modes of Intervention:  Activity  Additional Comments:     Maglione,Warner Laduca E 11/14/2022, 6:25 AM

## 2022-11-14 NOTE — Group Note (Signed)
Date:  11/14/2022 Time:  9:03 PM  Group Topic/Focus:  Emotional Education:   The focus of this group is to discuss what feelings/emotions are, and how they are experienced.    Participation Level:  Active  Participation Quality:  Appropriate  Affect:  Appropriate  Cognitive:  Appropriate  Insight: Appropriate  Engagement in Group:  Engaged  Modes of Intervention:  Education  Additional Comments:    Jeannette How 11/14/2022, 9:03 PM

## 2022-11-14 NOTE — BHH Counselor (Signed)
CSW received email from [place], [facility] Financial Navigator stating that she reached out to DSS and spoke with Murriel Hopper.   Christy Sartorius stated that although they have already received all documents needed for the application they have until 12/23/22 to complete the application (45 days) from when it was received. She told financial navigator [name] that she will not complete it earlier than 12/23/22.   Reynaldo Minium, MSW, [place] 11/14/2022 10:22 AM

## 2022-11-14 NOTE — BHH Counselor (Signed)
CSW contacted Malachi Bonds at [facility] [phone]) in regards to placement for pt, unable to reach, LVM requesting return call.   Reynaldo Minium, MSW, [place] 11/14/2022 9:33 AM

## 2022-11-14 NOTE — BHH Counselor (Signed)
CSW attempted to contact [name] [phone]) per suggestion by Wandra Mannan, unable to reach, unable to leave voice message due to full mailbox  Reynaldo Minium, MSW, [facility] 11/14/2022 9:29 AM

## 2022-11-14 NOTE — Group Note (Signed)
Recreation Therapy Group Note   Group Topic:Communication  Group Date: 11/14/2022 Start Time: 1400 End Time: 1450 Facilitators: Rosina Lowenstein, LRT, CTRS Location: Courtyard  Group Description: Reminisce Cards. Patients drew a laminated card out of a bag that had a word or phrase on it. Pt encouraged to speak about a time in their life or fond memory that specifically relates to the word they chose out of the bag. An example would be: "parenthood, meals, siblings, travel, or home".  LRT prompted following questions and encouraged contribution from peers to increase communication.   Goal Area(s) Addressed: Patient will increase verbal communication by conversing with peers. Patient will contribute to group discussion with minimal prompting. Patient will reminisce a positive memory or moment in their life.   Affect/Mood: Appropriate   Participation Level: Active and Engaged   Participation Quality: Independent   Behavior: Calm and Cooperative   Speech/Thought Process: Loose association   Insight: Fair   Judgement: Fair    Modes of Intervention: Guided Discussion   Patient Response to Interventions:  Receptive   Education Outcome:  Acknowledges education   Clinical Observations/Individualized Feedback: Betty Henderson was active in their participation of session activities and group discussion. 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 Pt mentioned losing her husband 9 years ago and that since she is a single woman she wants to "hit up the clubs" once she leaves here. Pt also mentioned wanting to hold a family reunion one day since "none of the cousins know each other". Pt was pleasant and interacted well with LRT and peers duration of session.   Plan: Continue to engage patient in RT group sessions 2-3x/week.   Rosina Lowenstein, LRT, CTRS 11/14/2022 2:55 PM

## 2022-11-15 DIAGNOSIS — F250 Schizoaffective disorder, bipolar type: Secondary | ICD-10-CM | POA: Diagnosis not present

## 2022-11-15 NOTE — Group Note (Signed)
Recreation Therapy Group Note   Group Topic:Leisure Education  Group Date: 11/15/2022 Start Time: 1400 End Time: 1450 Facilitators: Rosina Lowenstein, LRT, CTRS Location:  Day Room  Group Description: Leisure. Patients were given the option to choose from playing bingo, singing karaoke, listening to music or going outside to the courtyard. Pts collectively chose to play bingo as a group. LRT and pts discussed the meaning of leisure, the importance of participating in leisure during their free time/when they're outside of the hospital, as well as how our leisure interests can also serve as coping skills.   Goal Area(s) Addressed:  Patient will learn the definition of "leisure". Patient will practice making a positive decision. Patient will have the opportunity to try a new leisure activity. Patient will communicate with peers and LRT.    Affect/Mood: N/A   Participation Level: Did not attend    Clinical Observations/Individualized Feedback: Betty Henderson did not attend group.  Plan: Continue to engage patient in RT group sessions 2-3x/week.   Rosina Lowenstein, LRT, CTRS 11/15/2022 2:58 PM

## 2022-11-15 NOTE — Progress Notes (Signed)
Patient pleasant and cooperative.   Denies SI/HI and AVH.  Denies anxiety and depression. Slept well.  Good appetite. Patient was not responding to internal stimuli this shift.    No scheduled medications. No PRNs given.  15 min checks in place for safety.  Isolates to room with the exception of meals. Participated in MHT group in courtyard.

## 2022-11-15 NOTE — Group Note (Signed)
Date:  11/15/2022 Time:  11:59 AM  Group Topic/Focus:  Activity Group:  The purpose of this group is to encourage patients to go outside in the courtyard for some fresh air and to get some exercise.    Participation Level:  Active  Participation Quality:  Appropriate  Affect:  Appropriate  Cognitive:  Appropriate  Insight: Appropriate  Engagement in Group:  Engaged  Modes of Intervention:  Activity  Additional Comments:    Betty Henderson 11/15/2022, 11:59 AM

## 2022-11-15 NOTE — Plan of Care (Signed)

## 2022-11-15 NOTE — Progress Notes (Signed)
[facility] MD Progress Note  11/15/2022 1:13 PM Annecia Becton  MRN:  [id number] Subjective: Betty Henderson is seen on rounds.  No news on her discharge planning by social work.  She has been compliant with medications.  She has been in good controls on the unit.  No side effects from her medications.  Nurses report no issues. Principal Problem: Schizoaffective disorder, bipolar type (HCC) Diagnosis: Principal Problem:   Schizoaffective disorder, bipolar type (HCC)  Total Time spent with patient: 15 minutes  Past Psychiatric History: Schizoaffective disorder  Past Medical History:  Past Medical History:  Diagnosis Date   Anemia    Arthritis    Chronic pain    Drug-seeking behavior    Malingering    Osteopetrosis    Psychosis (HCC)    Schizoaffective disorder, bipolar type (HCC)     Past Surgical History:  Procedure Laterality Date   MOUTH SURGERY     TUBAL LIGATION     Family History:  Family History  Family history unknown: Yes   Family Psychiatric  History: Unremarkable Social History:  Social History   Substance and Sexual Activity  Alcohol Use Not Currently   Comment: 1 cocktail 3 weeks ago     Social History   Substance and Sexual Activity  Drug Use Not Currently   Types: Cocaine   Comment: states "it's legal though"    Social History   Socioeconomic History   Marital status: Single    Spouse name: Not on file   Number of children: Not on file   Years of education: Not on file   Highest education level: Not on file  Occupational History   Not on file  Tobacco Use   Smoking status: Every Day    Current packs/day: 0.50    Types: Cigarettes   Smokeless tobacco: Never  Vaping Use   Vaping status: Never Used  Substance and Sexual Activity   Alcohol use: Not Currently    Comment: 1 cocktail 3 weeks ago   Drug use: Not Currently    Types: Cocaine    Comment: states "it's legal though"   Sexual activity: Never  Other Topics Concern   Not on file  Social  History Narrative   Not on file   Social Determinants of Health   Financial Resource Strain: Not on file  Food Insecurity: Food Insecurity Present (08/22/2022)   Hunger Vital Sign    Worried About Running Out of Food in the Last Year: Sometimes true    Ran Out of Food in the Last Year: Sometimes true  Transportation Needs: Patient Unable To Answer (08/22/2022)   PRAPARE - Transportation    Lack of Transportation (Medical): Patient unable to answer    Lack of Transportation (Non-Medical): Patient unable to answer  Recent Concern: Transportation Needs - Unmet Transportation Needs (06/13/2022)   Received from [facility]   [facility] - Transportation    Lack of Transportation (Medical): Not on file    Lack of Transportation (Non-Medical): Yes  Physical Activity: Not on file  Stress: Not on file  Social Connections: Unknown (08/06/2021)   Received from [facility], [facility]   Social Network    Social Network: Not on file   Additional Social History:                         Sleep: Good  Appetite:  Good  Current Medications: Current Facility-Administered Medications  Medication Dose Route Frequency Provider Last Rate  Last Admin   acetaminophen (TYLENOL) tablet 650 mg  650 mg Oral Q6H PRN [name], DO   650 mg at 11/13/22 2119   alum & mag hydroxide-simeth (MAALOX/MYLANTA) 200-200-20 MG/5ML suspension 30 mL  30 mL Oral Q4H PRN [name], DO   30 mL at 09/20/22 1142   diphenhydrAMINE (BENADRYL) capsule 50 mg  50 mg Oral Q6H PRN [name], DO   50 mg at 09/29/22 2254   Or   diphenhydrAMINE (BENADRYL) injection 50 mg  50 mg Intramuscular Q6H PRN [name], DO       haloperidol (HALDOL) tablet 5 mg  5 mg Oral Q6H PRN [name], DO   5 mg at 09/28/22 2149   Or   haloperidol lactate (HALDOL) injection 5 mg  5 mg Intramuscular Q6H PRN [name], DO       ibuprofen (ADVIL)  tablet 600 mg  600 mg Oral Q6H PRN [name], [name], MD   600 mg at 11/14/22 2130   LORazepam (ATIVAN) tablet 1 mg  1 mg Oral TID PRN [name], DO   1 mg at 09/27/22 0025   magnesium hydroxide (MILK OF MAGNESIA) suspension 30 mL  30 mL Oral Daily PRN [name], DO   30 mL at 11/08/22 1012   paliperidone (INVEGA) 24 hr tablet 3 mg  3 mg Oral QHS [name], DO   3 mg at 11/14/22 2130   traZODone (DESYREL) tablet 50 mg  50 mg Oral QHS PRN [name], DO   50 mg at 11/14/22 2130    Lab Results: No results found for this or any previous visit (from the past 48 hour(s)).  Blood Alcohol level:  Lab Results  Component Value Date   ETH <10 08/17/2022   ETH <10 01/11/2021    Metabolic Disorder Labs: No results found for: "HGBA1C", "MPG" No results found for: "PROLACTIN" No results found for: "CHOL", "TRIG", "HDL", "CHOLHDL", "VLDL", "LDLCALC"  Physical Findings: AIMS:  , ,  ,  ,    CIWA:    COWS:     Musculoskeletal: Strength & Muscle Tone: within normal limits Gait & Station: normal Patient leans: N/A  Psychiatric Specialty Exam:  Presentation  General Appearance:  Casual; Neat  Eye Contact: Fair  Speech: Slow; Slurred  Speech Volume: Decreased  Handedness: Right   Mood and Affect  Mood: Anxious  Affect: Inappropriate   Thought Process  Thought Processes: Disorganized  Descriptions of Associations:Loose  Orientation:Partial  Thought Content:Illogical; Rumination  History of Schizophrenia/Schizoaffective disorder:No data recorded Duration of Psychotic Symptoms:No data recorded Hallucinations:No data recorded Ideas of Reference:Paranoia  Suicidal Thoughts:No data recorded Homicidal Thoughts:No data recorded  Sensorium  Memory: Immediate Fair; Remote Poor  Judgment: Poor  Insight: Poor   Executive Functions  Concentration: Poor  Attention Span: Fair  Recall: [employer] of  Knowledge: Fair  Language: Fair   Psychomotor Activity  Psychomotor Activity:No data recorded  Assets  Assets: Communication Skills   Sleep  Sleep:No data recorded    Blood pressure 120/63, pulse 63, temperature (!) 97.3 F (36.3 C), resp. rate 18, height 5\' 6"  (1.676 m), weight 64 kg, SpO2 100%. Body mass index is 22.76 kg/m.   Treatment Plan Summary: Daily contact with patient to assess and evaluate symptoms and progress in treatment, Medication management, and Plan continue current medications.  [name], DO 11/15/2022, 1:13 PM

## 2022-11-15 NOTE — Plan of Care (Signed)
  Problem: Elimination: Goal: Will not experience complications related to bowel motility Outcome: Progressing Goal: Will not experience complications related to urinary retention Outcome: Progressing  Patient had an uneventful evening complaint with medications no adverse effects noted. Visible in Milieu Denies SI/HI/AH endorses minimal AH. Q 15 minutes safety checks ongoing. Patient remains safe.

## 2022-11-16 DIAGNOSIS — F250 Schizoaffective disorder, bipolar type: Secondary | ICD-10-CM | POA: Diagnosis not present

## 2022-11-16 NOTE — Group Note (Signed)
Date:  11/16/2022 Time:  11:00 AM  Group Topic/Focus:  Community Group/Outside Rec Explaining the rules and expectations of the unit and went outside and got fresh air and listened the music.   Participation Level:  Active  Participation Quality:  Appropriate  Affect:  Appropriate  Cognitive:  Appropriate and Disorganized  Insight: Good and Improving  Engagement in Group:  Engaged and Improving  Modes of Intervention:  Activity and Discussion  Additional Comments:    [name] 11/16/2022, 11:00 AM

## 2022-11-16 NOTE — Progress Notes (Signed)
[facility] MD Progress Note  11/16/2022 12:05 PM Betty Henderson  MRN:  [id number] Subjective: Betty Henderson is seen on rounds.  No changes.  I have not heard any discharge plans yet.  She is doing well on her current medications good controls.  Nurses report no issues. Principal Problem: Schizoaffective disorder, bipolar type (HCC) Diagnosis: Principal Problem:   Schizoaffective disorder, bipolar type (HCC)  Total Time spent with patient: 15 minutes  Past Psychiatric History: Schizoaffective disorder  Past Medical History:  Past Medical History:  Diagnosis Date   Anemia    Arthritis    Chronic pain    Drug-seeking behavior    Malingering    Osteopetrosis    Psychosis (HCC)    Schizoaffective disorder, bipolar type (HCC)     Past Surgical History:  Procedure Laterality Date   MOUTH SURGERY     TUBAL LIGATION     Family History:  Family History  Family history unknown: Yes   Family Psychiatric  History: Unremarkable Social History:  Social History   Substance and Sexual Activity  Alcohol Use Not Currently   Comment: 1 cocktail 3 weeks ago     Social History   Substance and Sexual Activity  Drug Use Not Currently   Types: Cocaine   Comment: states "it's legal though"    Social History   Socioeconomic History   Marital status: Single    Spouse name: Not on file   Number of children: Not on file   Years of education: Not on file   Highest education level: Not on file  Occupational History   Not on file  Tobacco Use   Smoking status: Every Day    Current packs/day: 0.50    Types: Cigarettes   Smokeless tobacco: Never  Vaping Use   Vaping status: Never Used  Substance and Sexual Activity   Alcohol use: Not Currently    Comment: 1 cocktail 3 weeks ago   Drug use: Not Currently    Types: Cocaine    Comment: states "it's legal though"   Sexual activity: Never  Other Topics Concern   Not on file  Social History Narrative   Not on file   Social Determinants of  Health   Financial Resource Strain: Not on file  Food Insecurity: Food Insecurity Present (08/22/2022)   Hunger Vital Sign    Worried About Running Out of Food in the Last Year: Sometimes true    Ran Out of Food in the Last Year: Sometimes true  Transportation Needs: Patient Unable To Answer (08/22/2022)   PRAPARE - Transportation    Lack of Transportation (Medical): Patient unable to answer    Lack of Transportation (Non-Medical): Patient unable to answer  Recent Concern: Transportation Needs - Unmet Transportation Needs (06/13/2022)   Received from [facility], [facility]   [facility] - Transportation    Lack of Transportation (Medical): Not on file    Lack of Transportation (Non-Medical): Yes  Physical Activity: Not on file  Stress: Not on file  Social Connections: Unknown (08/06/2021)   Received from [facility], [facility]   Social Network    Social Network: Not on file   Additional Social History:                         Sleep: Good  Appetite:  Good  Current Medications: Current Facility-Administered Medications  Medication Dose Route Frequency Provider Last Rate Last Admin   acetaminophen (TYLENOL) tablet 650 mg  650  mg Oral Q6H PRN [name], DO   650 mg at 11/13/22 2119   alum & mag hydroxide-simeth (MAALOX/MYLANTA) 200-200-20 MG/5ML suspension 30 mL  30 mL Oral Q4H PRN [name], DO   30 mL at 09/20/22 1142   diphenhydrAMINE (BENADRYL) capsule 50 mg  50 mg Oral Q6H PRN [name], DO   50 mg at 09/29/22 2254   Or   diphenhydrAMINE (BENADRYL) injection 50 mg  50 mg Intramuscular Q6H PRN [name], DO       haloperidol (HALDOL) tablet 5 mg  5 mg Oral Q6H PRN [name], DO   5 mg at 09/28/22 2149   Or   haloperidol lactate (HALDOL) injection 5 mg  5 mg Intramuscular Q6H PRN [name], DO       ibuprofen (ADVIL) tablet 600 mg  600 mg Oral Q6H PRN [name], [name], MD   600 mg  at 11/14/22 2130   LORazepam (ATIVAN) tablet 1 mg  1 mg Oral TID PRN [name], DO   1 mg at 09/27/22 0025   magnesium hydroxide (MILK OF MAGNESIA) suspension 30 mL  30 mL Oral Daily PRN [name], DO   30 mL at 11/08/22 1012   paliperidone (INVEGA) 24 hr tablet 3 mg  3 mg Oral QHS [name], DO   3 mg at 11/15/22 2126   traZODone (DESYREL) tablet 50 mg  50 mg Oral QHS PRN [name], DO   50 mg at 11/15/22 2126    Lab Results: No results found for this or any previous visit (from the past 48 hour(s)).  Blood Alcohol level:  Lab Results  Component Value Date   ETH <10 08/17/2022   ETH <10 01/11/2021    Metabolic Disorder Labs: No results found for: "HGBA1C", "MPG" No results found for: "PROLACTIN" No results found for: "CHOL", "TRIG", "HDL", "CHOLHDL", "VLDL", "LDLCALC"  Physical Findings: AIMS:  , ,  ,  ,    CIWA:    COWS:     Musculoskeletal: Strength & Muscle Tone: within normal limits Gait & Station: normal Patient leans: N/A  Psychiatric Specialty Exam:  Presentation  General Appearance:  Casual; Neat  Eye Contact: Fair  Speech: Slow; Slurred  Speech Volume: Decreased  Handedness: Right   Mood and Affect  Mood: Anxious  Affect: Inappropriate   Thought Process  Thought Processes: Disorganized  Descriptions of Associations:Loose  Orientation:Partial  Thought Content:Illogical; Rumination  History of Schizophrenia/Schizoaffective disorder:No data recorded Duration of Psychotic Symptoms:No data recorded Hallucinations:No data recorded Ideas of Reference:Paranoia  Suicidal Thoughts:No data recorded Homicidal Thoughts:No data recorded  Sensorium  Memory: Immediate Fair; Remote Poor  Judgment: Poor  Insight: Poor   Executive Functions  Concentration: Poor  Attention Span: Fair  Recall: [employer] of Knowledge: Fair  Language: Fair   Psychomotor Activity   Psychomotor Activity:No data recorded  Assets  Assets: Communication Skills   Sleep  Sleep:No data recorded    Blood pressure 108/61, pulse 71, temperature 98.4 F (36.9 C), resp. rate 18, height 5\' 6"  (1.676 m), weight 64 kg, SpO2 98%. Body mass index is 22.76 kg/m.   Treatment Plan Summary: Daily contact with patient to assess and evaluate symptoms and progress in treatment, Medication management, and Plan continue current medication.  [name], DO 11/16/2022, 12:05 PM

## 2022-11-16 NOTE — Group Note (Deleted)
Date:  11/16/2022 Time:  8:31 PM  Group Topic/Focus:  Goals Group:   The focus of this group is to help patients establish daily goals to achieve during treatment and discuss how the patient can incorporate goal setting into their daily lives to aide in recovery.     Participation Level:  {BHH PARTICIPATION GHWEX:93716}  Participation Quality:  {BHH PARTICIPATION QUALITY:22265}  Affect:  {BHH AFFECT:22266}  Cognitive:  {BHH COGNITIVE:22267}  Insight: {BHH Insight2:20797}  Engagement in Group:  {BHH ENGAGEMENT IN RCVEL:38101}  Modes of Intervention:  {BHH MODES OF INTERVENTION:22269}  Additional Comments:  ***  [name] 11/16/2022, 8:31 PM

## 2022-11-16 NOTE — Plan of Care (Signed)
D: Pt alert and oriented. Pt denies experiencing any anxiety/depression at this time. Pt denies experiencing any pain at this time. Pt denies experiencing any SI/HI, or AVH at this time.   A: Support and encouragement provided. Frequent verbal contact made. Routine safety checks conducted q15 minutes.   R: Pt verbally contracts for safety at this time. Pt compliant with treatment plan. Pt interacts well with others on the unit. Pt remains safe at this time. Plan of care ongoing.  Problem: Education: Goal: Knowledge of General Education information will improve Description: Including pain rating scale, medication(s)/side effects and non-pharmacologic comfort measures Outcome: Progressing   Problem: Nutrition: Goal: Adequate nutrition will be maintained Outcome: Progressing

## 2022-11-16 NOTE — Progress Notes (Signed)

## 2022-11-17 DIAGNOSIS — F250 Schizoaffective disorder, bipolar type: Secondary | ICD-10-CM | POA: Diagnosis not present

## 2022-11-17 MED ORDER — PALIPERIDONE PALMITATE ER 156 MG/ML IM SUSY
156.0000 mg | PREFILLED_SYRINGE | Freq: Once | INTRAMUSCULAR | Status: AC
  Administered 2022-11-18: 156 mg via INTRAMUSCULAR
  Filled 2022-11-17: qty 1.5
  Filled 2022-11-17: qty 1

## 2022-11-17 NOTE — Progress Notes (Signed)
Patient is a voluntary admission to [facility] for SAD/ Bipolar since 08/22/22. Pt is awaiting guardianship and placement. She is isolative, coming out for meals and group. Interacts well with staff and peers. Calm, cooperative and takes medication with no problems. Ambulates well with slow steady gait with no assistance. Denies S/I, H/I and AVH.  Will continue to monitor.

## 2022-11-17 NOTE — Progress Notes (Signed)
[facility] MD Progress Note  11/17/2022 12:43 PM Betty Henderson  MRN:  [id number] Subjective: Betty Henderson.  She has been in good controls and compliant with medication.  Still awaiting discharge planning.  No side effects from her medicine.  She is eating and sleeping well. Principal Problem: Schizoaffective disorder, bipolar type (HCC) Diagnosis: Principal Problem:   Schizoaffective disorder, bipolar type (HCC)  Total Time spent with patient: 15 minutes  Past Psychiatric History: Schizoaffective disorder  Past Medical History:  Past Medical History:  Diagnosis Date   Anemia    Arthritis    Chronic pain    Drug-seeking behavior    Malingering    Osteopetrosis    Psychosis (HCC)    Schizoaffective disorder, bipolar type (HCC)     Past Surgical History:  Procedure Laterality Date   MOUTH SURGERY     TUBAL LIGATION     Family History:  Family History  Family history unknown: Yes   Family Psychiatric  History: Unremarkable Social History:  Social History   Substance and Sexual Activity  Alcohol Use Not Currently   Comment: 1 cocktail 3 weeks ago     Social History   Substance and Sexual Activity  Drug Use Not Currently   Types: Cocaine   Comment: states "it's legal though"    Social History   Socioeconomic History   Marital status: Single    Spouse name: Not on file   Number of children: Not on file   Years of education: Not on file   Highest education level: Not on file  Occupational History   Not on file  Tobacco Use   Smoking status: Every Day    Current packs/day: 0.50    Types: Cigarettes   Smokeless tobacco: Never  Vaping Use   Vaping status: Never Used  Substance and Sexual Activity   Alcohol use: Not Currently    Comment: 1 cocktail 3 weeks ago   Drug use: Not Currently    Types: Cocaine    Comment: states "it's legal though"   Sexual activity: Never  Other Topics Concern   Not on file  Social History Narrative   Not on file    Social Determinants of Health   Financial Resource Strain: Not on file  Food Insecurity: Food Insecurity Present (08/22/2022)   Hunger Vital Sign    Worried About Running Out of Food in the Last Year: Sometimes true    Ran Out of Food in the Last Year: Sometimes true  Transportation Needs: Patient Unable To Answer (08/22/2022)   PRAPARE - Transportation    Lack of Transportation (Medical): Patient unable to answer    Lack of Transportation (Non-Medical): Patient unable to answer  Recent Concern: Transportation Needs - Unmet Transportation Needs (06/13/2022)   Received from [facility], [facility]   [facility] - Transportation    Lack of Transportation (Medical): Not on file    Lack of Transportation (Non-Medical): Yes  Physical Activity: Not on file  Stress: Not on file  Social Connections: Unknown (08/06/2021)   Received from [facility], [facility]   Social Network    Social Network: Not on file   Additional Social History:                         Sleep: Good  Appetite:  Good  Current Medications: Current Facility-Administered Medications  Medication Dose Route Frequency Provider Last Rate Last Admin   acetaminophen (TYLENOL) tablet 650 mg  650 mg Oral Q6H PRN [name], DO   650 mg at 11/13/22 2119   alum & mag hydroxide-simeth (MAALOX/MYLANTA) 200-200-20 MG/5ML suspension 30 mL  30 mL Oral Q4H PRN [name], DO   30 mL at 09/20/22 1142   diphenhydrAMINE (BENADRYL) capsule 50 mg  50 mg Oral Q6H PRN [name], DO   50 mg at 09/29/22 2254   Or   diphenhydrAMINE (BENADRYL) injection 50 mg  50 mg Intramuscular Q6H PRN [name], DO       haloperidol (HALDOL) tablet 5 mg  5 mg Oral Q6H PRN [name], DO   5 mg at 09/28/22 2149   Or   haloperidol lactate (HALDOL) injection 5 mg  5 mg Intramuscular Q6H PRN [name], DO       ibuprofen (ADVIL) tablet 600 mg  600 mg Oral Q6H PRN  [name], [name], MD   600 mg at 11/14/22 2130   LORazepam (ATIVAN) tablet 1 mg  1 mg Oral TID PRN [name], DO   1 mg at 09/27/22 0025   magnesium hydroxide (MILK OF MAGNESIA) suspension 30 mL  30 mL Oral Daily PRN [name], DO   30 mL at 11/08/22 1012   paliperidone (INVEGA) 24 hr tablet 3 mg  3 mg Oral QHS [name], DO   3 mg at 11/16/22 2149   traZODone (DESYREL) tablet 50 mg  50 mg Oral QHS PRN [name], DO   50 mg at 11/16/22 2149    Lab Results: No results found for this or any previous visit (from the past 48 hour(s)).  Blood Alcohol level:  Lab Results  Component Value Date   ETH <10 08/17/2022   ETH <10 01/11/2021    Metabolic Disorder Labs: No results found for: "HGBA1C", "MPG" No results found for: "PROLACTIN" No results found for: "CHOL", "TRIG", "HDL", "CHOLHDL", "VLDL", "LDLCALC"  Physical Findings: AIMS:  , ,  ,  ,    CIWA:    COWS:     Musculoskeletal: Strength & Muscle Tone: within normal limits Gait & Station: normal Patient leans: N/A  Psychiatric Specialty Exam:  Presentation  General Appearance:  Casual; Neat  Eye Contact: Fair  Speech: Slow; Slurred  Speech Volume: Decreased  Handedness: Right   Mood and Affect  Mood: Anxious  Affect: Inappropriate   Thought Process  Thought Processes: Disorganized  Descriptions of Associations:Loose  Orientation:Partial  Thought Content:Illogical; Rumination  History of Schizophrenia/Schizoaffective disorder:No data recorded Duration of Psychotic Symptoms:No data recorded Hallucinations:No data recorded Ideas of Reference:Paranoia  Suicidal Thoughts:No data recorded Homicidal Thoughts:No data recorded  Sensorium  Memory: Immediate Fair; Remote Poor  Judgment: Poor  Insight: Poor   Executive Functions  Concentration: Poor  Attention Span: Fair  Recall: [employer] of  Knowledge: Fair  Language: Fair   Psychomotor Activity  Psychomotor Activity:No data recorded  Assets  Assets: Communication Skills   Sleep  Sleep:No data recorded   Physical Exam: Physical Exam Vitals and nursing note reviewed.  Constitutional:      Appearance: Normal appearance. She is normal weight.  Neurological:     General: No focal deficit present.     Mental Status: She is alert and oriented to person, place, and time.  Psychiatric:        Mood and Affect: Mood normal.        Behavior: Behavior normal.    Review of Systems  Constitutional: Negative.  HENT: Negative.    Eyes: Negative.   Respiratory: Negative.    Cardiovascular: Negative.   Gastrointestinal: Negative.   Genitourinary: Negative.   Musculoskeletal: Negative.   Skin: Negative.   Neurological: Negative.   Endo/Heme/Allergies: Negative.   Psychiatric/Behavioral: Negative.     Blood pressure 116/77, pulse 68, temperature (!) 97.4 F (36.3 C), resp. rate 18, height 5\' 6"  (1.676 m), weight 64 kg, SpO2 100%. Body mass index is 22.76 kg/m.   Treatment Plan Summary: Daily contact with patient to assess and evaluate symptoms and progress in treatment, Medication management, and Plan continue current medications.  [name], DO 11/17/2022, 12:43 PM

## 2022-11-17 NOTE — Group Note (Signed)
Date:  11/17/2022 Time:  9:26 AM  Group Topic/Focus:  Goals Group:   The focus of this group is to help patients establish daily goals to achieve during treatment and discuss how the patient can incorporate goal setting into their daily lives to aide in recovery.   Participation Level:  Did Not Attend   [name] [name] 11/17/2022, 9:26 AM

## 2022-11-17 NOTE — Group Note (Signed)
[facility] LCSW Group Therapy Note   Group Date: 11/17/2022 Start Time: 1300 End Time: 1350   Type of Therapy/Topic:  Group Therapy:  Emotion Regulation  Participation Level:  Minimal   Mood:  Description of Group:    The purpose of this group is to assist patients in learning to regulate negative emotions and experience positive emotions. Patients will be guided to discuss ways in which they have been vulnerable to their negative emotions. These vulnerabilities will be juxtaposed with experiences of positive emotions or situations, and patients challenged to use positive emotions to combat negative ones. Special emphasis will be placed on coping with negative emotions in conflict situations, and patients will process healthy conflict resolution skills.  Therapeutic Goals: Patient will identify two positive emotions or experiences to reflect on in order to balance out negative emotions:  Patient will label two or more emotions that they find the most difficult to experience:  Patient will be able to demonstrate positive conflict resolution skills through discussion or role plays:   Summary of Patient Progress:   The patient identified Fall as her favorite season during the [address]. The Patient struggled to give an organized answer but was able to identify her emotional triggers by pointing to the options on the worksheet. The patient pointed to someone accuses, threatens, or criticizes me as several of her emotional triggers and taking a warm shower and eating chocolate as positive coping tools to restore peace of mind. The patient received encouragement from the group and acknowledged them with a smile and eye contact.   Therapeutic Modalities:   Cognitive Behavioral Therapy Feelings Identification Dialectical Behavioral Therapy   Azucena Kuba, LCSWA

## 2022-11-17 NOTE — Plan of Care (Signed)
  Problem: Health Behavior/Discharge Planning: Goal: Ability to manage health-related needs will improve Outcome: Progressing   Problem: Nutrition: Goal: Adequate nutrition will be maintained Outcome: Progressing   

## 2022-11-17 NOTE — Group Note (Signed)
Date:  11/17/2022 Time:  10:47 PM  Group Topic/Focus:  Making Healthy Choices:   The focus of this group is to help patients identify negative/unhealthy choices they were using prior to admission and identify positive/healthier coping strategies to replace them upon discharge.    Participation Level:  Minimal  Participation Quality:  Attentive  Affect:  Appropriate  Cognitive:  Oriented  Insight: Improving  Engagement in Group:  Improving  Modes of Intervention:  Discussion  Additional Comments:    [name] 11/17/2022, 10:47 PM

## 2022-11-17 NOTE — BH IP Treatment Plan (Signed)
Interdisciplinary Treatment and Diagnostic Plan Update  11/17/2022 Time of Session: 9:39 am Betty Henderson MRN: [id number]  Principal Diagnosis: Schizoaffective disorder, bipolar type [facility])  Secondary Diagnoses: Principal Problem:   Schizoaffective disorder, bipolar type (HCC)   Current Medications:  Current Facility-Administered Medications  Medication Dose Route Frequency Provider Last Rate Last Admin   acetaminophen (TYLENOL) tablet 650 mg  650 mg Oral Q6H PRN [name], DO   650 mg at 11/13/22 2119   alum & mag hydroxide-simeth (MAALOX/MYLANTA) 200-200-20 MG/5ML suspension 30 mL  30 mL Oral Q4H PRN [name], DO   30 mL at 09/20/22 1142   diphenhydrAMINE (BENADRYL) capsule 50 mg  50 mg Oral Q6H PRN [name], DO   50 mg at 09/29/22 2254   Or   diphenhydrAMINE (BENADRYL) injection 50 mg  50 mg Intramuscular Q6H PRN [name], DO       haloperidol (HALDOL) tablet 5 mg  5 mg Oral Q6H PRN [name], DO   5 mg at 09/28/22 2149   Or   haloperidol lactate (HALDOL) injection 5 mg  5 mg Intramuscular Q6H PRN [name], DO       ibuprofen (ADVIL) tablet 600 mg  600 mg Oral Q6H PRN [name], [name], MD   600 mg at 11/14/22 2130   LORazepam (ATIVAN) tablet 1 mg  1 mg Oral TID PRN [name], DO   1 mg at 09/27/22 0025   magnesium hydroxide (MILK OF MAGNESIA) suspension 30 mL  30 mL Oral Daily PRN [name], DO   30 mL at 11/08/22 1012   paliperidone (INVEGA) 24 hr tablet 3 mg  3 mg Oral QHS [name], DO   3 mg at 11/16/22 2149   traZODone (DESYREL) tablet 50 mg  50 mg Oral QHS PRN [name], DO   50 mg at 11/16/22 2149   PTA Medications: Medications Prior to Admission  Medication Sig Dispense Refill Last Dose   ondansetron (ZOFRAN) 4 MG tablet Take 1 tablet (4 mg total) by mouth every 8 (eight) hours as needed for nausea or vomiting. (Patient not  taking: Reported on 08/17/2022) 20 tablet 0     Patient Stressors: Financial difficulties   Health problems   Medication change or noncompliance    Patient Strengths: Ability for insight   Treatment Modalities: Medication Management, Group therapy, Case management,  1 to 1 session with clinician, Psychoeducation, Recreational therapy.   Physician Treatment Plan for Primary Diagnosis: Schizoaffective disorder, bipolar type (HCC) Long Term Goal(s): Improvement in symptoms so as ready for discharge   Short Term Goals: Ability to identify changes in lifestyle to reduce recurrence of condition will improve Ability to verbalize feelings will improve Ability to disclose and discuss suicidal ideas Ability to demonstrate self-control will improve Ability to identify and develop effective coping behaviors will improve Ability to maintain clinical measurements within normal limits will improve Compliance with prescribed medications will improve Ability to identify triggers associated with substance abuse/mental health issues will improve  Medication Management: Evaluate patient's response, side effects, and tolerance of medication regimen.  Therapeutic Interventions: 1 to 1 sessions, Unit Group sessions and Medication administration.  Evaluation of Outcomes: Progressing  Physician Treatment Plan for Secondary Diagnosis: Principal Problem:   Schizoaffective disorder, bipolar type (HCC)  Long Term Goal(s): Improvement in symptoms so as ready for discharge   Short Term Goals: Ability to identify changes in lifestyle to reduce recurrence of condition  will improve Ability to verbalize feelings will improve Ability to disclose and discuss suicidal ideas Ability to demonstrate self-control will improve Ability to identify and develop effective coping behaviors will improve Ability to maintain clinical measurements within normal limits will improve Compliance with prescribed medications will  improve Ability to identify triggers associated with substance abuse/mental health issues will improve     Medication Management: Evaluate patient's response, side effects, and tolerance of medication regimen.  Therapeutic Interventions: 1 to 1 sessions, Unit Group sessions and Medication administration.  Evaluation of Outcomes: Progressing   RN Treatment Plan for Primary Diagnosis: Schizoaffective disorder, bipolar type (HCC) Long Term Goal(s): Knowledge of disease and therapeutic regimen to maintain health will improve  Short Term Goals: Ability to remain free from injury will improve, Ability to verbalize frustration and anger appropriately will improve, Ability to demonstrate self-control, Ability to participate in decision making will improve, Ability to verbalize feelings will improve, Ability to identify and develop effective coping behaviors will improve, and Compliance with prescribed medications will improve  Medication Management: RN will administer medications as ordered by provider, will assess and evaluate patient's response and provide education to patient for prescribed medication. RN will report any adverse and/or side effects to prescribing provider.  Therapeutic Interventions: 1 on 1 counseling sessions, Psychoeducation, Medication administration, Evaluate responses to treatment, Monitor vital signs and CBGs as ordered, Perform/monitor CIWA, COWS, AIMS and Fall Risk screenings as ordered, Perform wound care treatments as ordered.  Evaluation of Outcomes: Progressing   LCSW Treatment Plan for Primary Diagnosis: Schizoaffective disorder, bipolar type (HCC) Long Term Goal(s): Safe transition to appropriate next level of care at discharge, Engage patient in therapeutic group addressing interpersonal concerns.  Short Term Goals: Engage patient in aftercare planning with referrals and resources, Increase social support, Increase ability to appropriately verbalize feelings,  Increase emotional regulation, Facilitate acceptance of mental health diagnosis and concerns, and Increase skills for wellness and recovery  Therapeutic Interventions: Assess for all discharge needs, 1 to 1 time with Social worker, Explore available resources and support systems, Assess for adequacy in community support network, Educate family and significant other(s) on suicide prevention, Complete Psychosocial Assessment, Interpersonal group therapy.  Evaluation of Outcomes: Progressing   Progress in Treatment: Attending groups: Yes. Participating in groups: Yes. Taking medication as prescribed: Yes. Toleration medication: Yes. Family/Significant other contact made: Yes, individual(s) contacted:  Torie Olivarez, son, [phone] Patient understands diagnosis: Yes. Discussing patient identified problems/goals with staff: Yes. Medical problems stabilized or resolved: Yes. Denies suicidal/homicidal ideation: Yes. Issues/concerns per patient self-inventory: No. Other: none  New problem(s) identified: No, Describe:  none  New Short Term/Long Term Goal(s): Update 6/30: none at this time. Update 09/27/2022:  No changes at this time.  Update 10/02/2022:  No changes at this time. Update 10/07/22: No changes at this time 10/12/22: No changes at this time Update 10/18/22: No changes at this time Update 10/23/22: No changes at this time Update 10/28/22: No changes at this time Update 11/07/22: No changes at this time Update 11/12/22: No changes at this time  Update 11/17/22: None at this time.    Patient Goals:  Update 6/30: none at this time. Update 09/27/2022:  No changes at this time. Update 10/02/2022:  No changes at this time. Update 10/07/22: No changes at this time 10/12/22: No changes at this time Update 10/18/22: No changes at this time Update 10/23/22: No changes at this time Update 10/28/22: No changes at this time Update 11/07/22: No changes at this time Update  11/12/22: No changes at this time Update  11/17/22: None at this time.     Discharge Plan or Barriers: Update 6/30: APS report has been made and patient is being investigated for guardianship needs.  Remains homeless with limited supports. Update 09/27/2022:  No changes at this time.  Update 10/02/2022:  Patient remains safe on the unit at this time.  Patient remains psychotic at this time.  APS report has been made, however, no follow up from the caseworker on her case.  No safe discharge identified.  CSW has requested that application for Medicaid and disability be completed.   Update 10/07/22: No changes at this time 10/12/22: No changes at this time Update 10/18/22: No changes at this time Update 10/23/22: No changes at this time Update 10/23/22: No changes at this time Update 10/28/22: According to [facility], pt's caseworker at DSS, the petition was filed for guardianship on 10/21/22, now awaiting for the petition to be approvedUpdate 11/07/22: No changes at this time Update 11/12/22: CSW sent over patients information to [facility] and [facility], awaiting a response. CSW awaiting word from DSS regarding pt's petition and what agency will become her guardian. Update 11/17/22: No changes at this time.    Reason for Continuation of Hospitalization: Hallucinations Mania Medication stabilization   Estimated Length of Stay:  Update 6/30: 1-7 days Update 09/27/2022:  TBD Update 10/02/2022:  No changes at this time. Up[place] 10/18/22: No changes at this time te 10/07/22: No changes at this time Update 10/18/22: No changes at this time Update 10/23/22: No changes at this time Update 10/28/22: No changes at this time Update 11/07/22: No changes at this time Update 11/12/22: No changes at this time  Update 11/17/22: No changes at this time.   Last 3 [place] Suicide Severity Risk Score: Flowsheet Row Admission (Current) from 08/22/2022 in [facility] ED from 08/17/2022 in [facility] Emergency Department at [facility] ED from 08/12/2022  in [facility] Emergency Department at [facility]  C-SSRS RISK CATEGORY Low Risk No Risk No Risk       Last [facility] 2/9 Scores:     No data to display          Scribe for Treatment Team: Gardner Candle 11/17/2022 9:39 AM

## 2022-11-18 DIAGNOSIS — F250 Schizoaffective disorder, bipolar type: Secondary | ICD-10-CM | POA: Diagnosis not present

## 2022-11-18 NOTE — Group Note (Signed)
Date:  11/18/2022 Time:  9:19 PM  Group Topic/Focus:  Self Care:   The focus of this group is to help patients understand the importance of self-care in order to improve or restore emotional, physical, spiritual, interpersonal, and financial health.    Participation Level:  Did Not Attend  Participation Quality:      Affect:      Cognitive:      Insight: None  Engagement in Group:  None  Modes of Intervention:      Additional Comments:    [name] 11/18/2022, 9:19 PM

## 2022-11-18 NOTE — Progress Notes (Signed)
[facility] MD Progress Note  11/18/2022 1:10 PM Betty Henderson  MRN:  [id number] Subjective: Betty Henderson is seen on rounds.  No changes.  She has been pleasant cooperative and compliant with medications.  She received her Invega [place] 156 this morning.  She has no complaints.  No side effects.  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 Principal Problem: Schizoaffective disorder, bipolar type (HCC) Diagnosis: Principal Problem:   Schizoaffective disorder, bipolar type (HCC)  Total Time spent with patient: 15 minutes  Past Psychiatric History: Schizoaffective disorder  Past Medical History:  Past Medical History:  Diagnosis Date   Anemia    Arthritis    Chronic pain    Drug-seeking behavior    Malingering    Osteopetrosis    Psychosis (HCC)    Schizoaffective disorder, bipolar type (HCC)     Past Surgical History:  Procedure Laterality Date   MOUTH SURGERY     TUBAL LIGATION     Family History:  Family History  Family history unknown: Yes   Family Psychiatric  History: Unremarkable Social History:  Social History   Substance and Sexual Activity  Alcohol Use Not Currently   Comment: 1 cocktail 3 weeks ago     Social History   Substance and Sexual Activity  Drug Use Not Currently   Types: Cocaine   Comment: states "it's legal though"    Social History   Socioeconomic History   Marital status: Single    Spouse name: Not on file   Number of children: Not on file   Years of education: Not on file   Highest education level: Not on file  Occupational History   Not on file  Tobacco Use   Smoking status: Every Day    Current packs/day: 0.50    Types: Cigarettes   Smokeless tobacco: Never  Vaping Use   Vaping status: Never Used  Substance and Sexual Activity   Alcohol use: Not Currently    Comment: 1 cocktail 3 weeks ago   Drug use: Not Currently    Types: Cocaine    Comment: states "it's legal though"   Sexual activity: Never  Other Topics Concern    Not on file  Social History Narrative   Not on file   Social Determinants of Health   Financial Resource Strain: Not on file  Food Insecurity: Food Insecurity Present (08/22/2022)   Hunger Vital Sign    Worried About Running Out of Food in the Last Year: Sometimes true    Ran Out of Food in the Last Year: Sometimes true  Transportation Needs: Patient Unable To Answer (08/22/2022)   PRAPARE - Transportation    Lack of Transportation (Medical): Patient unable to answer    Lack of Transportation (Non-Medical): Patient unable to answer  Recent Concern: Transportation Needs - Unmet Transportation Needs (06/13/2022)   Received from [facility], [facility]   [facility] - Transportation    Lack of Transportation (Medical): Not on file    Lack of Transportation (Non-Medical): Yes  Physical Activity: Not on file  Stress: Not on file  Social Connections: Unknown (08/06/2021)   Received from [facility], [facility]   Social Network    Social Network: Not on file   Additional Social History:                         Sleep: Good  Appetite:  Good  Current Medications: Current Facility-Administered Medications  Medication Dose Route Frequency Provider Last  Rate Last Admin   acetaminophen (TYLENOL) tablet 650 mg  650 mg Oral Q6H PRN [name], DO   650 mg at 11/17/22 1859   alum & mag hydroxide-simeth (MAALOX/MYLANTA) 200-200-20 MG/5ML suspension 30 mL  30 mL Oral Q4H PRN [name], DO   30 mL at 09/20/22 1142   diphenhydrAMINE (BENADRYL) capsule 50 mg  50 mg Oral Q6H PRN [name], DO   50 mg at 09/29/22 2254   Or   diphenhydrAMINE (BENADRYL) injection 50 mg  50 mg Intramuscular Q6H PRN [name], DO       haloperidol (HALDOL) tablet 5 mg  5 mg Oral Q6H PRN [name], DO   5 mg at 09/28/22 2149   Or   haloperidol lactate (HALDOL) injection 5 mg  5 mg Intramuscular Q6H PRN [name], DO        ibuprofen (ADVIL) tablet 600 mg  600 mg Oral Q6H PRN [name], [name], MD   600 mg at 11/14/22 2130   LORazepam (ATIVAN) tablet 1 mg  1 mg Oral TID PRN [name], DO   1 mg at 09/27/22 0025   magnesium hydroxide (MILK OF MAGNESIA) suspension 30 mL  30 mL Oral Daily PRN [name], DO   30 mL at 11/08/22 1012   paliperidone (INVEGA) 24 hr tablet 3 mg  3 mg Oral QHS [name], DO   3 mg at 11/17/22 2118   traZODone (DESYREL) tablet 50 mg  50 mg Oral QHS PRN [name], DO   50 mg at 11/17/22 2118    Lab Results: No results found for this or any previous visit (from the past 48 hour(s)).  Blood Alcohol level:  Lab Results  Component Value Date   ETH <10 08/17/2022   ETH <10 01/11/2021    Metabolic Disorder Labs: No results found for: "HGBA1C", "MPG" No results found for: "PROLACTIN" No results found for: "CHOL", "TRIG", "HDL", "CHOLHDL", "VLDL", "LDLCALC"  Physical Findings: AIMS:  , ,  ,  ,    CIWA:    COWS:     Musculoskeletal: Strength & Muscle Tone: within normal limits Gait & Station: normal Patient leans: N/A  Psychiatric Specialty Exam:  Presentation  General Appearance:  Casual; Neat  Eye Contact: Fair  Speech: Slow; Slurred  Speech Volume: Decreased  Handedness: Right   Mood and Affect  Mood: Anxious  Affect: Inappropriate   Thought Process  Thought Processes: Disorganized  Descriptions of Associations:Loose  Orientation:Partial  Thought Content:Illogical; Rumination  History of Schizophrenia/Schizoaffective disorder:No data recorded Duration of Psychotic Symptoms:No data recorded Hallucinations:No data recorded Ideas of Reference:Paranoia  Suicidal Thoughts:No data recorded Homicidal Thoughts:No data recorded  Sensorium  Memory: Immediate Fair; Remote Poor  Judgment: Poor  Insight: Poor   Executive Functions  Concentration: Poor  Attention  Span: Fair  Recall: [employer] of Knowledge: Fair  Language: Fair   Psychomotor Activity  Psychomotor Activity:No data recorded  Assets  Assets: Communication Skills   Sleep  Sleep:No data recorded    Blood pressure 113/75, pulse 62, temperature (!) 97.3 F (36.3 C), resp. rate 16, height 5\' 6"  (1.676 m), weight 64 kg, SpO2 98%. Body mass index is 22.76 kg/m.   Treatment Plan Summary: Daily contact with patient to assess and evaluate symptoms and progress in treatment, Medication management, and Plan continue current medications.  Shahd Occhipinti Tresea Mall, DO 11/18/2022, 1:10 PM

## 2022-11-18 NOTE — Progress Notes (Signed)
   11/18/22 2000  Psych Admission Type (Psych Patients Only)  Admission Status Voluntary  Psychosocial Assessment  Patient Complaints None  Eye Contact Fair  Facial Expression Flat  Affect Appropriate to circumstance  Speech Soft  Interaction Minimal  Motor Activity Slow  Appearance/Hygiene Unremarkable  Behavior Characteristics Cooperative  Mood Pleasant  Thought Process  Coherency WDL  Content WDL  Delusions None reported or observed  Perception WDL  Hallucination None reported or observed  Judgment Impaired  Confusion Mild  Danger to Self  Current suicidal ideation? Denies  Agreement Not to Harm Self Yes  Description of Agreement verbal  Danger to Others  Danger to Others None reported or observed  Danger to Others Abnormal  Harmful Behavior to others No threats or harm toward other people  Destructive Behavior No threats or harm toward property

## 2022-11-18 NOTE — BHH Counselor (Signed)
CSW contacted Malachi Bonds at [facility] [phone]) in regards to placement for pt.  Ms.Gloria stated that CSW could send pts info to [email or web address]  CSW will send pt's information.   Reynaldo Minium, MSW, [place] 11/18/2022 3:23 PM

## 2022-11-18 NOTE — Plan of Care (Signed)
  Problem: Education: Goal: Knowledge of General Education information will improve Description: Including pain rating scale, medication(s)/side effects and non-pharmacologic comfort measures Outcome: Progressing   Problem: Health Behavior/Discharge Planning: Goal: Ability to manage health-related needs will improve Outcome: Progressing   Problem: Clinical Measurements: Goal: Ability to maintain clinical measurements within normal limits will improve Outcome: Progressing Goal: Diagnostic test results will improve Outcome: Progressing   Problem: Nutrition: Goal: Adequate nutrition will be maintained Outcome: Progressing   Problem: Coping: Goal: Level of anxiety will decrease Outcome: Progressing   Problem: Elimination: Goal: Will not experience complications related to bowel motility Outcome: Progressing Goal: Will not experience complications related to urinary retention Outcome: Progressing   Problem: Pain Managment: Goal: General experience of comfort will improve Outcome: Progressing   Problem: Safety: Goal: Ability to remain free from injury will improve Outcome: Progressing   Problem: Skin Integrity: Goal: Risk for impaired skin integrity will decrease Outcome: Progressing

## 2022-11-18 NOTE — BHH Counselor (Signed)
CSW contacted [facility] of [place] [phone]) to discuss female bed availability.   CSW spoke to [place]. Inetta Fermo stated they looked at pt's information and are unable to meet pt's care needs.   CSW  will inform supervisors at next difficult to place meeting.   Reynaldo Minium, MSW, [place] 11/18/2022 3:18 PM

## 2022-11-18 NOTE — Group Note (Signed)
Date:  11/18/2022 Time:  10:22 AM  Group Topic/Focus:  Making Healthy Choices:   The focus of this group is to help patients identify negative/unhealthy choices they were using prior to admission and identify positive/healthier coping strategies to replace them upon discharge.    Participation Level:  Did Not Attend    Rodena Goldmann 11/18/2022, 10:22 AM

## 2022-11-18 NOTE — Progress Notes (Signed)
Patient is voluntary admit to psych. Today she got her monthly injection of Invega that she tolerate well. Isolates to her room but joins peers with activities and interacts with peers and staff.  Will continue to monitor.

## 2022-11-18 NOTE — Plan of Care (Signed)
  Problem: Education: Goal: Knowledge of General Education information will improve Description Including pain rating scale, medication(s)/side effects and non-pharmacologic comfort measures Outcome: Progressing   Problem: Health Behavior/Discharge Planning: Goal: Ability to manage health-related needs will improve Outcome: Progressing   

## 2022-11-19 DIAGNOSIS — F250 Schizoaffective disorder, bipolar type: Secondary | ICD-10-CM | POA: Diagnosis not present

## 2022-11-19 NOTE — Group Note (Signed)
[facility] LCSW Group Therapy Note   Group Date: 11/19/2022 Start Time: 1315 End Time: 1400   Type of Therapy/Topic:  Group Therapy:  Emotion Regulation  Participation Level:  Active   Mood:  Description of Group:    The purpose of this group is to assist patients in learning to regulate negative emotions and experience positive emotions. Patients will be guided to discuss ways in which they have been vulnerable to their negative emotions. These vulnerabilities will be juxtaposed with experiences of positive emotions or situations, and patients challenged to use positive emotions to combat negative ones. Special emphasis will be placed on coping with negative emotions in conflict situations, and patients will process healthy conflict resolution skills.  Therapeutic Goals: Patient will identify two positive emotions or experiences to reflect on in order to balance out negative emotions:  Patient will label two or more emotions that they find the most difficult to experience:  Patient will be able to demonstrate positive conflict resolution skills through discussion or role plays:   Summary of Patient Progress:    Pt actively and respectfully engaged with the subject matters, and facilitator     Therapeutic Modalities:   Cognitive Behavioral Therapy Feelings Identification Dialectical Behavioral Therapy   Elza Rafter, LCSWA

## 2022-11-19 NOTE — Group Note (Signed)
Recreation Therapy Group Note   Group Topic:Coping Skills  Group Date: 11/19/2022 Start Time: 1400 End Time: 1445 Facilitators: Rosina Lowenstein, LRT, CTRS Location:  Day Room  Group Description: Mind Map.  Patient was provided a blank template of a diagram with 32 blank boxes in a tiered system, branching from the center (similar to a bubble chart). LRT directed patients to label the middle of the diagram "Coping Skills". LRT and patients then came up with 8 different coping skills as examples. Pt were directed to record their coping skills in the 2nd tier boxes closest to the center.  Patients would then share their coping skills with the group as LRT wrote them out. LRT gave a handout of 99 different coping skills at the end of group.    Goal Area(s) Addressed: Patients will be able to define "coping skills". Patient will identify new coping skills.  Patient will increase communication.   Affect/Mood: Appropriate   Participation Level: Active and Engaged   Participation Quality: Independent   Behavior: Appropriate, Calm, and Cooperative   Speech/Thought Process: Coherent   Insight: Good and Improved   Judgement: Good   Modes of Intervention: Activity and Worksheet   Patient Response to Interventions:  Attentive, Engaged, Interested , and Receptive   Education Outcome:  Acknowledges education   Clinical Observations/Individualized Feedback: Caely was active in their participation of session activities and group discussion. Pt identified "singing, eat, watch movies, group therapy" as coping skills. Pt spontaneously contributed to group discussion while interacting well with LRT and peers duration of session.    Plan: Continue to engage patient in RT group sessions 2-3x/week.   Rosina Lowenstein, LRT, CTRS 11/19/2022 3:00 PM

## 2022-11-19 NOTE — Progress Notes (Signed)
Patient is now voluntary admitted on Aug 22, 2022 for worsening psychosis. Diagnosis of schizoaffective disorder.   She denies SI/HI/AVH. She denies depression and anxiety. Patient participated in all 3 groups and was able to communicate effectively. Pain 8/10. Ibuprofen 600 mg PRN adm at 1221. Upon follow up, pain decreased to a 4.   Q15 minute unit checks in place.

## 2022-11-19 NOTE — Group Note (Signed)
Date:  11/19/2022 Time:  10:29 AM  Group Topic/Focus:  Activity Group:  The focus of this group is to promote activity for the patients to allow them to get some fresh air and also allow them to exercise.     Participation Level:  Active  Participation Quality:  Appropriate  Affect:  Appropriate  Cognitive:  Appropriate  Insight: Appropriate  Engagement in Group:  Engaged  Modes of Intervention:  Activity  Additional Comments:    Betty Henderson 11/19/2022, 10:29 AM

## 2022-11-19 NOTE — Progress Notes (Signed)
[facility] MD Progress Note  11/19/2022 12:52 PM Betty Henderson  MRN:  [id number] Subjective: Betty Henderson is seen on rounds.  She continues on her [place].  No side effects.  Still looking for discharge disposition.  She has been in good controls.  No complaints.  Nurses report no issues. Principal Problem: Schizoaffective disorder, bipolar type (HCC) Diagnosis: Principal Problem:   Schizoaffective disorder, bipolar type (HCC)  Total Time spent with patient: 15 minutes  Past Psychiatric History: Schizoaffective disorder  Past Medical History:  Past Medical History:  Diagnosis Date   Anemia    Arthritis    Chronic pain    Drug-seeking behavior    Malingering    Osteopetrosis    Psychosis (HCC)    Schizoaffective disorder, bipolar type (HCC)     Past Surgical History:  Procedure Laterality Date   MOUTH SURGERY     TUBAL LIGATION     Family History:  Family History  Family history unknown: Yes   Family Psychiatric  History: Unremarkable Social History:  Social History   Substance and Sexual Activity  Alcohol Use Not Currently   Comment: 1 cocktail 3 weeks ago     Social History   Substance and Sexual Activity  Drug Use Not Currently   Types: Cocaine   Comment: states "it's legal though"    Social History   Socioeconomic History   Marital status: Single    Spouse name: Not on file   Number of children: Not on file   Years of education: Not on file   Highest education level: Not on file  Occupational History   Not on file  Tobacco Use   Smoking status: Every Day    Current packs/day: 0.50    Types: Cigarettes   Smokeless tobacco: Never  Vaping Use   Vaping status: Never Used  Substance and Sexual Activity   Alcohol use: Not Currently    Comment: 1 cocktail 3 weeks ago   Drug use: Not Currently    Types: Cocaine    Comment: states "it's legal though"   Sexual activity: Never  Other Topics Concern   Not on file  Social History Narrative   Not on file    Social Determinants of Health   Financial Resource Strain: Not on file  Food Insecurity: Food Insecurity Present (08/22/2022)   Hunger Vital Sign    Worried About Running Out of Food in the Last Year: Sometimes true    Ran Out of Food in the Last Year: Sometimes true  Transportation Needs: Patient Unable To Answer (08/22/2022)   PRAPARE - Transportation    Lack of Transportation (Medical): Patient unable to answer    Lack of Transportation (Non-Medical): Patient unable to answer  Recent Concern: Transportation Needs - Unmet Transportation Needs (06/13/2022)   [facility], [facility]   [facility] - Transportation    Lack of Transportation (Medical): Not on file    Lack of Transportation (Non-Medical): Yes  Physical Activity: Not on file  Stress: Not on file  Social Connections: Unknown (08/06/2021)   Received from [facility], [facility]   Social Network    Social Network: Not on file   Additional Social History:                         Sleep: Good  Appetite:  Good  Current Medications: Current Facility-Administered Medications  Medication Dose Route Frequency Provider Last Rate Last Admin   acetaminophen (TYLENOL) tablet 650  mg  650 mg Oral Q6H PRN [name], DO   650 mg at 11/17/22 1859   alum & mag hydroxide-simeth (MAALOX/MYLANTA) 200-200-20 MG/5ML suspension 30 mL  30 mL Oral Q4H PRN [name], DO   30 mL at 09/20/22 1142   diphenhydrAMINE (BENADRYL) capsule 50 mg  50 mg Oral Q6H PRN [name], DO   50 mg at 09/29/22 2254   Or   diphenhydrAMINE (BENADRYL) injection 50 mg  50 mg Intramuscular Q6H PRN [name], DO       haloperidol (HALDOL) tablet 5 mg  5 mg Oral Q6H PRN [name], DO   5 mg at 09/28/22 2149   Or   haloperidol lactate (HALDOL) injection 5 mg  5 mg Intramuscular Q6H PRN [name], DO       ibuprofen (ADVIL) tablet 600 mg  600 mg Oral Q6H PRN  [name], [name], MD   600 mg at 11/19/22 1221   LORazepam (ATIVAN) tablet 1 mg  1 mg Oral TID PRN [name], DO   1 mg at 09/27/22 0025   magnesium hydroxide (MILK OF MAGNESIA) suspension 30 mL  30 mL Oral Daily PRN [name], DO   30 mL at 11/08/22 1012   paliperidone (INVEGA) 24 hr tablet 3 mg  3 mg Oral QHS [name], DO   3 mg at 11/18/22 2155   traZODone (DESYREL) tablet 50 mg  50 mg Oral QHS PRN [name], DO   50 mg at 11/17/22 2118    Lab Results: No results found for this or any previous visit (from the past 48 hour(s)).  Blood Alcohol level:  Lab Results  Component Value Date   ETH <10 08/17/2022   ETH <10 01/11/2021    Metabolic Disorder Labs: No results found for: "HGBA1C", "MPG" No results found for: "PROLACTIN" No results found for: "CHOL", "TRIG", "HDL", "CHOLHDL", "VLDL", "LDLCALC"  Physical Findings: AIMS:  , ,  ,  ,    CIWA:    COWS:     Musculoskeletal: Strength & Muscle Tone: within normal limits Gait & Station: normal Patient leans: N/A  Psychiatric Specialty Exam:  Presentation  General Appearance:  Casual; Neat  Eye Contact: Fair  Speech: Slow; Slurred  Speech Volume: Decreased  Handedness: Right   Mood and Affect  Mood: Anxious  Affect: Inappropriate   Thought Process  Thought Processes: Disorganized  Descriptions of Associations:Loose  Orientation:Partial  Thought Content:Illogical; Rumination  History of Schizophrenia/Schizoaffective disorder:No data recorded Duration of Psychotic Symptoms:No data recorded Hallucinations:No data recorded Ideas of Reference:Paranoia  Suicidal Thoughts:No data recorded Homicidal Thoughts:No data recorded  Sensorium  Memory: Immediate Fair; Remote Poor  Judgment: Poor  Insight: Poor   Executive Functions  Concentration: Poor  Attention Span: Fair  Recall: [employer] of  Knowledge: Fair  Language: Fair   Psychomotor Activity  Psychomotor Activity:No data recorded  Assets  Assets: Communication Skills   Sleep  Sleep:No data recorded   Blood pressure 102/62, pulse 81, temperature (!) 97.4 F (36.3 C), resp. rate 14, height 5\' 6"  (1.676 m), weight 64 kg, SpO2 99%. Body mass index is 22.76 kg/m.   Treatment Plan Summary: Daily contact with patient to assess and evaluate symptoms and progress in treatment, Medication management, and Plan continue current medication.  [name], DO 11/19/2022, 12:52 PM

## 2022-11-19 NOTE — Group Note (Signed)
Date:  11/19/2022 Time:  10:08 PM  Group Topic/Focus:  Goals Group:   The focus of this group is to help patients establish daily goals to achieve during treatment and discuss how the patient can incorporate goal setting into their daily lives to aide in recovery.    Participation Level:  Active  Participation Quality:  Appropriate  Affect:  Appropriate  Cognitive:  Appropriate  Insight: Good  Engagement in Group:  Engaged  Modes of Intervention:  Discussion  Additional Comments:    Betty Henderson 11/19/2022, 10:08 PM

## 2022-11-19 NOTE — Plan of Care (Signed)
  Problem: Safety: Goal: Ability to remain free from injury will improve Outcome: Progressing   Problem: Skin Integrity: Goal: Risk for impaired skin integrity will decrease Outcome: Progressing   

## 2022-11-20 DIAGNOSIS — F250 Schizoaffective disorder, bipolar type: Secondary | ICD-10-CM | POA: Diagnosis not present

## 2022-11-20 NOTE — Plan of Care (Signed)
  Problem: Elimination: Goal: Will not experience complications related to bowel motility Outcome: Progressing Goal: Will not experience complications related to urinary retention Outcome: Progressing   Problem: Safety: Goal: Ability to remain free from injury will improve Outcome: Progressing   Problem: Skin Integrity: Goal: Risk for impaired skin integrity will decrease Outcome: Progressing   

## 2022-11-20 NOTE — Group Note (Signed)
Date:  11/20/2022 Time:  8:15 PM  Group Topic/Focus:  Goals Group:   The focus of this group is to help patients establish daily goals to achieve during treatment and discuss how the patient can incorporate goal setting into their daily lives to aide in recovery.    Participation Level:  Active  Participation Quality:  Appropriate  Affect:  Appropriate  Cognitive:  Appropriate  Insight: Appropriate  Engagement in Group:  Engaged  Modes of Intervention:  Discussion  Additional Comments:    [name] 11/20/2022, 8:15 PM

## 2022-11-20 NOTE — Progress Notes (Signed)
Patient admitted on Aug 22, 2022 for worsening psychosis. She is now a voluntary admssion. Diagnosis of schizoaffective disorder.   She denies anxiety and depression. She denies SI/HI/AVH. No PRN's given. No distress noted or voiced.  Discharge planning ongoing. Q15 minute unit checks in place.

## 2022-11-20 NOTE — Progress Notes (Signed)
   11/19/22 2000  Psych Admission Type (Psych Patients Only)  Admission Status Voluntary  Psychosocial Assessment  Patient Complaints None  Eye Contact Fair  Facial Expression Flat  Affect Appropriate to circumstance  Speech Soft  Interaction Minimal  Motor Activity Slow  Appearance/Hygiene Unremarkable  Behavior Characteristics Cooperative  Mood Pleasant  Thought Process  Coherency WDL  Content WDL  Delusions None reported or observed  Perception WDL  Hallucination None reported or observed  Judgment Impaired  Confusion Mild  Danger to Self  Current suicidal ideation? Denies  Agreement Not to Harm Self Yes  Description of Agreement Verbal  Danger to Others  Danger to Others None reported or observed

## 2022-11-20 NOTE — Group Note (Signed)
Recreation Therapy Group Note   Group Topic:Coping Skills  Group Date: 11/20/2022 Start Time: 0930 End Time: 1020 Facilitators: Rosina Lowenstein, LRT, CTRS Location: Courtyard  Group Description: Music. Patients encouraged to name their favorite song(s) for LRT to play song through speaker for group to hear. Patient educated on the definition of leisure and the importance of having different leisure interests outside of the hospital. Group discussed how leisure activities can often be used as Pharmacologist and that listening to music is one example.   Goal Area(s) Addressed:  Patient will identify a current leisure interest.  Patient will practice making a positive decision. Patient will have the opportunity to try a new leisure activity.   Affect/Mood: Appropriate and Flat   Participation Level: Active and Engaged   Participation Quality: Independent   Behavior: Appropriate, Calm, and Cooperative   Speech/Thought Process: Coherent   Insight: Good   Judgement: Good   Modes of Intervention: Activity   Patient Response to Interventions:  Attentive and Receptive   Education Outcome:  Acknowledges education   Clinical Observations/Individualized Feedback: Betty Henderson was active in their participation of session activities and group discussion. Pt identified "Love and Happiness, Oh Happy Day, and Let Me Be Your [name]" are her favorite songs to listen to and sing. Pt interacted well with LRT and peers duration of session.   Plan: Continue to engage patient in RT group sessions 2-3x/week.   Rosina Lowenstein, LRT, CTRS 11/20/2022 12:15 PM

## 2022-11-20 NOTE — Progress Notes (Signed)
[facility] MD Progress Note  11/20/2022 1:20 PM Betty Henderson  MRN:  [id number] Subjective: Betty Henderson is seen on rounds.  She has been in good controls.  She has been compliant with medications.  Still awaiting discharge planning. Principal Problem: Schizoaffective disorder, bipolar type (HCC) Diagnosis: Principal Problem:   Schizoaffective disorder, bipolar type (HCC)  Total Time spent with patient: 15 minutes  Past Psychiatric History: Schizoaffective disorder bipolar type  Past Medical History:  Past Medical History:  Diagnosis Date   Anemia    Arthritis    Chronic pain    Drug-seeking behavior    Malingering    Osteopetrosis    Psychosis (HCC)    Schizoaffective disorder, bipolar type (HCC)     Past Surgical History:  Procedure Laterality Date   MOUTH SURGERY     TUBAL LIGATION     Family History:  Family History  Family history unknown: Yes   Family Psychiatric  History: Unremarkable Social History:  Social History   Substance and Sexual Activity  Alcohol Use Not Currently   Comment: 1 cocktail 3 weeks ago     Social History   Substance and Sexual Activity  Drug Use Not Currently   Types: Cocaine   Comment: states "it's legal though"    Social History   Socioeconomic History   Marital status: Single    Spouse name: Not on file   Number of children: Not on file   Years of education: Not on file   Highest education level: Not on file  Occupational History   Not on file  Tobacco Use   Smoking status: Every Day    Current packs/day: 0.50    Types: Cigarettes   Smokeless tobacco: Never  Vaping Use   Vaping status: Never Used  Substance and Sexual Activity   Alcohol use: Not Currently    Comment: 1 cocktail 3 weeks ago   Drug use: Not Currently    Types: Cocaine    Comment: states "it's legal though"   Sexual activity: Never  Other Topics Concern   Not on file  Social History Narrative   Not on file   Social Determinants of Health   Financial  Resource Strain: Not on file  Food Insecurity: Food Insecurity Present (08/22/2022)   Hunger Vital Sign    Worried About Running Out of Food in the Last Year: Sometimes true    Ran Out of Food in the Last Year: Sometimes true  Transportation Needs: Patient Unable To Answer (08/22/2022)   PRAPARE - Transportation    Lack of Transportation (Medical): Patient unable to answer    Lack of Transportation (Non-Medical): Patient unable to answer  Recent Concern: Transportation Needs - Unmet Transportation Needs (06/13/2022)   Received from [facility], [facility]   [facility] - Transportation    Lack of Transportation (Medical): Not on file    Lack of Transportation (Non-Medical): Yes  Physical Activity: Not on file  Stress: Not on file  Social Connections: Unknown (08/06/2021)   Received from [facility], [facility]   Social Network    Social Network: Not on file   Additional Social History:                         Sleep: Good  Appetite:  Good  Current Medications: Current Facility-Administered Medications  Medication Dose Route Frequency Provider Last Rate Last Admin   acetaminophen (TYLENOL) tablet 650 mg  650 mg Oral Q6H PRN [name],  DO   650 mg at 11/17/22 1859   alum & mag hydroxide-simeth (MAALOX/MYLANTA) 200-200-20 MG/5ML suspension 30 mL  30 mL Oral Q4H PRN [name], DO   30 mL at 09/20/22 1142   diphenhydrAMINE (BENADRYL) capsule 50 mg  50 mg Oral Q6H PRN [name], DO   50 mg at 09/29/22 2254   Or   diphenhydrAMINE (BENADRYL) injection 50 mg  50 mg Intramuscular Q6H PRN [name], DO       haloperidol (HALDOL) tablet 5 mg  5 mg Oral Q6H PRN [name], DO   5 mg at 09/28/22 2149   Or   haloperidol lactate (HALDOL) injection 5 mg  5 mg Intramuscular Q6H PRN [name], DO       ibuprofen (ADVIL) tablet 600 mg  600 mg Oral Q6H PRN [name], [name], MD   600 mg at 11/19/22 1221    LORazepam (ATIVAN) tablet 1 mg  1 mg Oral TID PRN [name], DO   1 mg at 09/27/22 0025   magnesium hydroxide (MILK OF MAGNESIA) suspension 30 mL  30 mL Oral Daily PRN [name], DO   30 mL at 11/08/22 1012   paliperidone (INVEGA) 24 hr tablet 3 mg  3 mg Oral QHS [name], DO   3 mg at 11/19/22 2137   traZODone (DESYREL) tablet 50 mg  50 mg Oral QHS PRN [name], DO   50 mg at 11/19/22 2138    Lab Results: No results found for this or any previous visit (from the past 48 hour(s)).  Blood Alcohol level:  Lab Results  Component Value Date   ETH <10 08/17/2022   ETH <10 01/11/2021    Metabolic Disorder Labs: No results found for: "HGBA1C", "MPG" No results found for: "PROLACTIN" No results found for: "CHOL", "TRIG", "HDL", "CHOLHDL", "VLDL", "LDLCALC"  Physical Findings: AIMS:  , ,  ,  ,    CIWA:    COWS:     Musculoskeletal: Strength & Muscle Tone: within normal limits Gait & Station: normal Patient leans: N/A  Psychiatric Specialty Exam:  Presentation  General Appearance:  Casual; Neat  Eye Contact: Fair  Speech: Slow; Slurred  Speech Volume: Decreased  Handedness: Right   Mood and Affect  Mood: Anxious  Affect: Inappropriate   Thought Process  Thought Processes: Disorganized  Descriptions of Associations:Loose  Orientation:Partial  Thought Content:Illogical; Rumination  History of Schizophrenia/Schizoaffective disorder:No data recorded Duration of Psychotic Symptoms:No data recorded Hallucinations:No data recorded Ideas of Reference:Paranoia  Suicidal Thoughts:No data recorded Homicidal Thoughts:No data recorded  Sensorium  Memory: Immediate Fair; Remote Poor  Judgment: Poor  Insight: Poor   Executive Functions  Concentration: Poor  Attention Span: Fair  Recall: [employer] of Knowledge: Fair  Language: Fair   Psychomotor Activity  Psychomotor Activity:No  data recorded  Assets  Assets: Communication Skills   Sleep  Sleep:No data recorded   Blood pressure 124/77, pulse 81, temperature 97.7 F (36.5 C), resp. rate 18, height 5\' 6"  (1.676 m), weight 64 kg, SpO2 100%. Body mass index is 22.76 kg/m.   Treatment Plan Summary: Daily contact with patient to assess and evaluate symptoms and progress in treatment, Medication management, and Plan continue current medications.  Evalise Abruzzese Tresea Mall, DO 11/20/2022, 1:20 PM

## 2022-11-20 NOTE — Plan of Care (Signed)
  Problem: Education: Goal: Knowledge of General Education information will improve Description: Including pain rating scale, medication(s)/side effects and non-pharmacologic comfort measures Outcome: Progressing   Problem: Health Behavior/Discharge Planning: Goal: Ability to manage health-related needs will improve Outcome: Progressing   Problem: Clinical Measurements: Goal: Ability to maintain clinical measurements within normal limits will improve Outcome: Progressing Goal: Diagnostic test results will improve Outcome: Progressing   Problem: Nutrition: Goal: Adequate nutrition will be maintained Outcome: Progressing   Problem: Coping: Goal: Level of anxiety will decrease Outcome: Progressing   Problem: Elimination: Goal: Will not experience complications related to bowel motility Outcome: Progressing Goal: Will not experience complications related to urinary retention Outcome: Progressing   Problem: Pain Managment: Goal: General experience of comfort will improve Outcome: Progressing   Problem: Safety: Goal: Ability to remain free from injury will improve Outcome: Progressing   Problem: Skin Integrity: Goal: Risk for impaired skin integrity will decrease Outcome: Progressing

## 2022-11-21 DIAGNOSIS — F250 Schizoaffective disorder, bipolar type: Secondary | ICD-10-CM | POA: Diagnosis not present

## 2022-11-21 NOTE — Progress Notes (Signed)
   11/21/22 2300  Psych Admission Type (Psych Patients Only)  Admission Status Voluntary  Psychosocial Assessment  Patient Complaints None  Eye Contact Brief  Facial Expression Flat  Affect Appropriate to circumstance  Speech Soft  Interaction Minimal  Motor Activity Slow  Appearance/Hygiene Unremarkable  Behavior Characteristics Cooperative  Mood Pleasant  Thought Process  Coherency WDL  Content WDL  Delusions None reported or observed  Perception WDL  Hallucination None reported or observed  Judgment Impaired  Confusion Mild  Danger to Self  Current suicidal ideation? Denies  Agreement Not to Harm Self Yes  Description of Agreement verbal  Danger to Others  Danger to Others None reported or observed  Danger to Others Abnormal  Harmful Behavior to others No threats or harm toward other people  Destructive Behavior No threats or harm toward property

## 2022-11-21 NOTE — Group Note (Signed)
Date:  11/21/2022 Time:  11:19 AM  Group Topic/Focus:  Outside Rec/Music Therapy  The purpose of this group is to get patients outside and get fresh air while listening to therapeutic music.   Participation Level:  Active  Participation Quality:  Appropriate and Sharing  Affect:  Appropriate  Cognitive:  Appropriate and Disorganized  Insight: Improving  Engagement in Group:  Engaged and Improving  Modes of Intervention:  Activity  Additional Comments:    [name] 11/21/2022, 11:19 AM

## 2022-11-21 NOTE — Progress Notes (Signed)
   11/20/22 2200  Psych Admission Type (Psych Patients Only)  Admission Status Voluntary  Psychosocial Assessment  Patient Complaints None  Eye Contact Fair  Facial Expression Flat  Affect Appropriate to circumstance  Speech Soft  Interaction Minimal  Motor Activity Slow  Appearance/Hygiene Unremarkable  Behavior Characteristics Cooperative  Mood Pleasant  Thought Process  Coherency WDL  Content WDL  Delusions None reported or observed  Perception WDL  Hallucination None reported or observed  Judgment Impaired  Confusion Mild  Danger to Self  Current suicidal ideation? Denies  Agreement Not to Harm Self Yes  Description of Agreement Verbal  Danger to Others  Danger to Others None reported or observed  Danger to Others Abnormal  Harmful Behavior to others No threats or harm toward other people  Destructive Behavior No threats or harm toward property

## 2022-11-21 NOTE — Progress Notes (Signed)
[facility] MD Progress Note  11/21/2022 10:45 AM Betty Henderson  MRN:  [id number] Subjective: Betty Henderson was seen on rounds.  She has been compliant with medications.  No side effects.  No evidence of EPS or TD.  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  She has been pleasant and cooperative and in good controls on the unit.  She has no complaints.  Nurses report no issues. Principal Problem: Schizoaffective disorder, bipolar type (HCC) Diagnosis: Principal Problem:   Schizoaffective disorder, bipolar type (HCC)  Total Time spent with patient: 15 minutes  Past Psychiatric History: Schizoaffective disorder bipolar type  Past Medical History:  Past Medical History:  Diagnosis Date   Anemia    Arthritis    Chronic pain    Drug-seeking behavior    Malingering    Osteopetrosis    Psychosis (HCC)    Schizoaffective disorder, bipolar type (HCC)     Past Surgical History:  Procedure Laterality Date   MOUTH SURGERY     TUBAL LIGATION     Family History:  Family History  Family history unknown: Yes   Family Psychiatric  History: Unremarkable Social History:  Social History   Substance and Sexual Activity  Alcohol Use Not Currently   Comment: 1 cocktail 3 weeks ago     Social History   Substance and Sexual Activity  Drug Use Not Currently   Types: Cocaine   Comment: states "it's legal though"    Social History   Socioeconomic History   Marital status: Single    Spouse name: Not on file   Number of children: Not on file   Years of education: Not on file   Highest education level: Not on file  Occupational History   Not on file  Tobacco Use   Smoking status: Every Day    Current packs/day: 0.50    Types: Cigarettes   Smokeless tobacco: Never  Vaping Use   Vaping status: Never Used  Substance and Sexual Activity   Alcohol use: Not Currently    Comment: 1 cocktail 3 weeks ago   Drug use: Not Currently    Types: Cocaine    Comment: states "it's legal though"    Sexual activity: Never  Other Topics Concern   Not on file  Social History Narrative   Not on file   Social Determinants of Health   Financial Resource Strain: Not on file  Food Insecurity: Food Insecurity Present (08/22/2022)   Hunger Vital Sign    Worried About Running Out of Food in the Last Year: Sometimes true    Ran Out of Food in the Last Year: Sometimes true  Transportation Needs: Patient Unable To Answer (08/22/2022)   PRAPARE - Transportation    Lack of Transportation (Medical): Patient unable to answer    Lack of Transportation (Non-Medical): Patient unable to answer  Recent Concern: Transportation Needs - Unmet Transportation Needs (06/13/2022)   Received from [facility], [facility]   [facility] - Transportation    Lack of Transportation (Medical): Not on file    Lack of Transportation (Non-Medical): Yes  Physical Activity: Not on file  Stress: Not on file  Social Connections: Unknown (08/06/2021)   Received from [facility], [facility]   Social Network    Social Network: Not on file   Additional Social History:                         Sleep: Good  Appetite:  Good  Current Medications: Current Facility-Administered Medications  Medication Dose Route Frequency Provider Last Rate Last Admin   acetaminophen (TYLENOL) tablet 650 mg  650 mg Oral Q6H PRN [name], DO   650 mg at 11/17/22 1859   alum & mag hydroxide-simeth (MAALOX/MYLANTA) 200-200-20 MG/5ML suspension 30 mL  30 mL Oral Q4H PRN [name], DO   30 mL at 09/20/22 1142   diphenhydrAMINE (BENADRYL) capsule 50 mg  50 mg Oral Q6H PRN [name], DO   50 mg at 09/29/22 2254   Or   diphenhydrAMINE (BENADRYL) injection 50 mg  50 mg Intramuscular Q6H PRN [name], DO       haloperidol (HALDOL) tablet 5 mg  5 mg Oral Q6H PRN [name], DO   5 mg at 09/28/22 2149   Or   haloperidol lactate (HALDOL) injection 5 mg  5 mg  Intramuscular Q6H PRN [name], DO       ibuprofen (ADVIL) tablet 600 mg  600 mg Oral Q6H PRN [name], [name], MD   600 mg at 11/19/22 1221   LORazepam (ATIVAN) tablet 1 mg  1 mg Oral TID PRN [name], DO   1 mg at 09/27/22 0025   magnesium hydroxide (MILK OF MAGNESIA) suspension 30 mL  30 mL Oral Daily PRN [name], DO   30 mL at 11/08/22 1012   paliperidone (INVEGA) 24 hr tablet 3 mg  3 mg Oral QHS [name], DO   3 mg at 11/20/22 2053   traZODone (DESYREL) tablet 50 mg  50 mg Oral QHS PRN [name], DO   50 mg at 11/20/22 2053    Lab Results: No results found for this or any previous visit (from the past 48 hour(s)).  Blood Alcohol level:  Lab Results  Component Value Date   ETH <10 08/17/2022   ETH <10 01/11/2021    Metabolic Disorder Labs: No results found for: "HGBA1C", "MPG" No results found for: "PROLACTIN" No results found for: "CHOL", "TRIG", "HDL", "CHOLHDL", "VLDL", "LDLCALC"  Physical Findings: AIMS:  , ,  ,  ,    CIWA:    COWS:     Musculoskeletal: Strength & Muscle Tone: within normal limits Gait & Station: normal Patient leans: N/A  Psychiatric Specialty Exam:  Presentation  General Appearance:  Casual; Neat  Eye Contact: Fair  Speech: Slow; Slurred  Speech Volume: Decreased  Handedness: Right   Mood and Affect  Mood: Anxious  Affect: Inappropriate   Thought Process  Thought Processes: Disorganized  Descriptions of Associations:Loose  Orientation:Partial  Thought Content:Illogical; Rumination  History of Schizophrenia/Schizoaffective disorder:No data recorded Duration of Psychotic Symptoms:No data recorded Hallucinations:No data recorded Ideas of Reference:Paranoia  Suicidal Thoughts:No data recorded Homicidal Thoughts:No data recorded  Sensorium  Memory: Immediate Fair; Remote Poor  Judgment: Poor  Insight: Poor   Executive Functions   Concentration: Poor  Attention Span: Fair  Recall: [employer] of Knowledge: Fair  Language: Fair   Psychomotor Activity  Psychomotor Activity:No data recorded  Assets  Assets: Communication Skills   Sleep  Sleep:No data recorded   Blood pressure (!) 98/53, pulse 65, temperature 98 F (36.7 C), resp. rate 16, height 5\' 6"  (1.676 m), weight 64 kg, SpO2 100%. Body mass index is 22.76 kg/m.   Treatment Plan Summary: Daily contact with patient to assess and evaluate symptoms and progress in treatment, Medication management, and Plan continue current medications.  Liyanna Cartwright Tresea Mall, DO  11/21/2022, 10:45 AM

## 2022-11-21 NOTE — Plan of Care (Signed)
D: Pt alert and oriented. Pt denies experiencing any anxiety/depression at this time. Pt denies experiencing any pain at this time. Pt denies experiencing any SI/HI, or AVH at this time.   A: Support and encouragement provided. Frequent verbal contact made. Routine safety checks conducted q15 minutes.   R: Pt verbally contracts for safety at this time. Pt compliant treatment plan. Pt interacts well with others on the unit. Pt remains safe at this time. Plan of care ongoing.  Problem: Coping: Goal: Level of anxiety will decrease Outcome: Progressing   Problem: Pain Managment: Goal: General experience of comfort will improve Outcome: Progressing

## 2022-11-21 NOTE — Group Note (Unsigned)
Date:  11/21/2022 Time:  9:15 PM  Group Topic/Focus:  Self Care:   The focus of this group is to help patients understand the importance of self-care in order to improve or restore emotional, physical, spiritual, interpersonal, and financial health.     Participation Level:  {BHH PARTICIPATION ZOXWR:60454}  Participation Quality:  {BHH PARTICIPATION QUALITY:22265}  Affect:  {BHH AFFECT:22266}  Cognitive:  {BHH COGNITIVE:22267}  Insight: {BHH Insight2:20797}  Engagement in Group:  {BHH ENGAGEMENT IN UJWJX:91478}  Modes of Intervention:  {BHH MODES OF INTERVENTION:22269}  Additional Comments:  ***  Betty Henderson 11/21/2022, 9:15 PM

## 2022-11-21 NOTE — Group Note (Signed)
Recreation Therapy Group Note   Group Topic:Relaxation  Group Date: 11/21/2022 Start Time: 1400 End Time: 1445 Facilitators: Clinton Gallant, CTRS Location: Day Room  Group Description: Chair Yoga. LRT and patients discussed the benefits of yoga and how it differs from strength exercises. LRT educated patients on the mental and physical benefits of yoga and deep breathing and how it can be used as a Associate Professor. LRT and patients followed along to a guided yoga session on the television that focused on all parts of the body, as well as deep breathing. Pt encouraged to stop movement at any time if they feel discomfort or pain.   Goal Area(s) Addressed: Patient will practice using relaxation technique. Patient will identify a new coping skill.  Patient will follow multistep directions to reduce anxiety and stress.   Affect/Mood: Appropriate and Flat   Participation Level: Moderate   Participation Quality: Independent   Behavior: Calm and Cooperative   Speech/Thought Process: Coherent   Insight: Good   Judgement: Good   Modes of Intervention: Activity   Patient Response to Interventions:  Receptive   Education Outcome:  Acknowledges education   Clinical Observations/Individualized Feedback: Walterine  was mostly active in their participation of session activities and group discussion. Pt appropriately completed most of the exercises shown in the video demonstration. Pt interacted well with LRT and peers duration of session.  Plan: Continue to engage patient in RT group sessions 2-3x/week.   Rosina Lowenstein, LRT, CTRS 11/21/2022 3:08 PM

## 2022-11-21 NOTE — Group Note (Signed)
LCSW Group Therapy Note  Group Date: 11/21/2022 Start Time: 1315 End Time: 1400   Type of Therapy and Topic:  Group Therapy - Healthy vs Unhealthy Coping Skills  Participation Level:  Did Not Attend   Description of Group The focus of this group was to determine what unhealthy coping techniques typically are used by group members and what healthy coping techniques would be helpful in coping with various problems. Patients were guided in becoming aware of the differences between healthy and unhealthy coping techniques. Patients were asked to identify 2-3 healthy coping skills they would like to learn to use more effectively.  Therapeutic Goals Patients learned that coping is what human beings do all day long to deal with various situations in their lives Patients defined and discussed healthy vs unhealthy coping techniques Patients identified their preferred coping techniques and identified whether these were healthy or unhealthy Patients determined 2-3 healthy coping skills they would like to become more familiar with and use more often. Patients provided support and ideas to each other   Summary of Patient Progress: X  Therapeutic Modalities Cognitive Behavioral Therapy Motivational Interviewing  Betty Henderson, [place] 11/21/2022  2:26 PM

## 2022-11-21 NOTE — Group Note (Signed)
Date:  11/21/2022 Time:  4:19 PM  Group Topic/Focus:  Therapeutic exercises that support impactful group sessions.  The cards serve as stand-alone treatment activities to help clients understand therapeutic concepts, build coping skills, and generalize their skills outside of the group.     Participation Level:  Did Not Attend   [name] 11/21/2022, 4:19 PM

## 2022-11-22 DIAGNOSIS — F250 Schizoaffective disorder, bipolar type: Secondary | ICD-10-CM | POA: Diagnosis not present

## 2022-11-22 NOTE — Progress Notes (Signed)
   11/22/22 2026  Psych Admission Type (Psych Patients Only)  Admission Status Voluntary  Psychosocial Assessment  Patient Complaints None  Eye Contact Brief  Facial Expression Flat  Affect Appropriate to circumstance  Speech Soft  Interaction Minimal  Motor Activity Slow;Shuffling  Appearance/Hygiene Unremarkable  Behavior Characteristics Cooperative;Calm  Mood Pleasant  Thought Process  Coherency WDL  Content WDL  Delusions None reported or observed  Perception WDL  Hallucination None reported or observed  Judgment Impaired  Confusion Mild  Danger to Self  Current suicidal ideation? Denies

## 2022-11-22 NOTE — Progress Notes (Signed)
   11/22/22 0602  15 Minute Checks  Location Bedroom  Visual Appearance Calm  Behavior Sleeping  Sleep (Behavioral Health Patients Only)  Calculate sleep? (Click Yes once per 24 hr at 0600 safety check) Yes  Documented sleep last 24 hours 10.25

## 2022-11-22 NOTE — Plan of Care (Signed)
  Problem: Nutrition: Goal: Adequate nutrition will be maintained Outcome: Progressing   Problem: Coping: Goal: Level of anxiety will decrease Outcome: Progressing   Problem: Elimination: Goal: Will not experience complications related to bowel motility Outcome: Progressing Goal: Will not experience complications related to urinary retention Outcome: Progressing   

## 2022-11-22 NOTE — Plan of Care (Signed)
D: Pt alert and oriented. Pt denies experiencing any anxiety/depression at this time. Pt denies experiencing any pain at this time. Pt denies experiencing any SI/HI, or AVH at this time.   A: Support and encouragement provided. Frequent verbal contact made. Routine safety checks conducted q15 minutes.   R: Pt verbally contracts for safety at this time. Pt compliant with treatment plan. Pt interacts well but minimally with others on the unit. Pt remains safe at this time. Plan of care ongoing.  Problem: Nutrition: Goal: Adequate nutrition will be maintained Outcome: Progressing   Problem: Coping: Goal: Level of anxiety will decrease Outcome: Progressing

## 2022-11-22 NOTE — Group Note (Unsigned)
Date:  11/22/2022 Time:  10:28 AM  Group Topic/Focus:  Goals Group:   The focus of this group is to help patients establish daily goals to achieve during treatment and discuss how the patient can incorporate goal setting into their daily lives to aide in recovery.     Participation Level:  {BHH PARTICIPATION ZOXWR:60454}  Participation Quality:  {BHH PARTICIPATION QUALITY:22265}  Affect:  {BHH AFFECT:22266}  Cognitive:  {BHH COGNITIVE:22267}  Insight: {BHH Insight2:20797}  Engagement in Group:  {BHH ENGAGEMENT IN UJWJX:91478}  Modes of Intervention:  {BHH MODES OF INTERVENTION:22269}  Additional Comments:  ***  Rodena Goldmann 11/22/2022, 10:28 AM

## 2022-11-22 NOTE — Group Note (Signed)
Recreation Therapy Group Note   Group Topic:Emotion Expression  Group Date: 11/22/2022 Start Time: 1400 End Time: 1440 Facilitators: Rosina Lowenstein, LRT, CTRS Location:  Dayroom  Group Description: Positivity Collage. LRT and patients discussed the importance of having a positive mindset and being happy. Patients received magazines, safety scissors, a glue stick and a piece of paper. Pts were encouraged to find images or words in the magazines that showed "happiness" or positivity to them. Pt shared their collage with the group once they were finished. LRT and pts discussed how it can be difficult to always have a positive mindset, especially when they have mental health challenges.    Goal Area(s) Addressed:  Pt will identify things associate with positivity. Pt will reduce negative thinking. Pt will identify a new coping skill of thinking positive thoughts.    Affect/Mood: N/A   Participation Level: Did not attend    Clinical Observations/Individualized Feedback: Betty Henderson did not attend group.  Plan: Continue to engage patient in RT group sessions 2-3x/week.   [address], LRT, CTRS 11/22/2022 3:19 PM

## 2022-11-22 NOTE — BH IP Treatment Plan (Signed)
Interdisciplinary Treatment and Diagnostic Plan Update  11/22/2022 Time of Session: 9:00 AM  Betty Henderson MRN: [id number]  Principal Diagnosis: Schizoaffective disorder, bipolar type (HCC)  Secondary Diagnoses: Principal Problem:   Schizoaffective disorder, bipolar type (HCC)   Current Medications:  Current Facility-Administered Medications  Medication Dose Route Frequency Provider Last Rate Last Admin   acetaminophen (TYLENOL) tablet 650 mg  650 mg Oral Q6H PRN [name], DO   650 mg at 11/17/22 1859   alum & mag hydroxide-simeth (MAALOX/MYLANTA) 200-200-20 MG/5ML suspension 30 mL  30 mL Oral Q4H PRN [name], DO   30 mL at 09/20/22 1142   diphenhydrAMINE (BENADRYL) capsule 50 mg  50 mg Oral Q6H PRN [name], DO   50 mg at 09/29/22 2254   Or   diphenhydrAMINE (BENADRYL) injection 50 mg  50 mg Intramuscular Q6H PRN [name], DO       haloperidol (HALDOL) tablet 5 mg  5 mg Oral Q6H PRN [name], DO   5 mg at 09/28/22 2149   Or   haloperidol lactate (HALDOL) injection 5 mg  5 mg Intramuscular Q6H PRN [name], DO       ibuprofen (ADVIL) tablet 600 mg  600 mg Oral Q6H PRN [name], [name], MD   600 mg at 11/19/22 1221   LORazepam (ATIVAN) tablet 1 mg  1 mg Oral TID PRN [name], DO   1 mg at 09/27/22 0025   magnesium hydroxide (MILK OF MAGNESIA) suspension 30 mL  30 mL Oral Daily PRN [name], DO   30 mL at 11/08/22 1012   paliperidone (INVEGA) 24 hr tablet 3 mg  3 mg Oral QHS [name], DO   3 mg at 11/21/22 2051   traZODone (DESYREL) tablet 50 mg  50 mg Oral QHS PRN [name], DO   50 mg at 11/21/22 2051   PTA Medications: Medications Prior to Admission  Medication Sig Dispense Refill Last Dose   ondansetron (ZOFRAN) 4 MG tablet Take 1 tablet (4 mg total) by mouth every 8 (eight) hours as needed for nausea or vomiting. (Patient not  taking: Reported on 08/17/2022) 20 tablet 0     Patient Stressors: Financial difficulties   Health problems   Medication change or noncompliance    Patient Strengths: Ability for insight   Treatment Modalities: Medication Management, Group therapy, Case management,  1 to 1 session with clinician, Psychoeducation, Recreational therapy.   Physician Treatment Plan for Primary Diagnosis: Schizoaffective disorder, bipolar type (HCC) Long Term Goal(s): Improvement in symptoms so as ready for discharge   Short Term Goals: Ability to identify changes in lifestyle to reduce recurrence of condition will improve Ability to verbalize feelings will improve Ability to disclose and discuss suicidal ideas Ability to demonstrate self-control will improve Ability to identify and develop effective coping behaviors will improve Ability to maintain clinical measurements within normal limits will improve Compliance with prescribed medications will improve Ability to identify triggers associated with substance abuse/mental health issues will improve  Medication Management: Evaluate patient's response, side effects, and tolerance of medication regimen.  Therapeutic Interventions: 1 to 1 sessions, Unit Group sessions and Medication administration.  Evaluation of Outcomes: Progressing  Physician Treatment Plan for Secondary Diagnosis: Principal Problem:   Schizoaffective disorder, bipolar type (HCC)  Long Term Goal(s): Improvement in symptoms so as ready for discharge   Short Term Goals: Ability to identify changes in lifestyle to reduce recurrence of  condition will improve Ability to verbalize feelings will improve Ability to disclose and discuss suicidal ideas Ability to demonstrate self-control will improve Ability to identify and develop effective coping behaviors will improve Ability to maintain clinical measurements within normal limits will improve Compliance with prescribed medications will  improve Ability to identify triggers associated with substance abuse/mental health issues will improve     Medication Management: Evaluate patient's response, side effects, and tolerance of medication regimen.  Therapeutic Interventions: 1 to 1 sessions, Unit Group sessions and Medication administration.  Evaluation of Outcomes: Progressing   RN Treatment Plan for Primary Diagnosis: Schizoaffective disorder, bipolar type (HCC) Long Term Goal(s): Knowledge of disease and therapeutic regimen to maintain health will improve  Short Term Goals: Ability to remain free from injury will improve, Ability to verbalize frustration and anger appropriately will improve, Ability to demonstrate self-control, Ability to participate in decision making will improve, Ability to verbalize feelings will improve, Ability to disclose and discuss suicidal ideas, Ability to identify and develop effective coping behaviors will improve, and Compliance with prescribed medications will improve  Medication Management: RN will administer medications as ordered by provider, will assess and evaluate patient's response and provide education to patient for prescribed medication. RN will report any adverse and/or side effects to prescribing provider.  Therapeutic Interventions: 1 on 1 counseling sessions, Psychoeducation, Medication administration, Evaluate responses to treatment, Monitor vital signs and CBGs as ordered, Perform/monitor CIWA, COWS, AIMS and Fall Risk screenings as ordered, Perform wound care treatments as ordered.  Evaluation of Outcomes: Progressing   LCSW Treatment Plan for Primary Diagnosis: Schizoaffective disorder, bipolar type (HCC) Long Term Goal(s): Safe transition to appropriate next level of care at discharge, Engage patient in therapeutic group addressing interpersonal concerns.  Short Term Goals: Engage patient in aftercare planning with referrals and resources, Increase social support, Increase  ability to appropriately verbalize feelings, Increase emotional regulation, Facilitate acceptance of mental health diagnosis and concerns, and Increase skills for wellness and recovery  Therapeutic Interventions: Assess for all discharge needs, 1 to 1 time with Social worker, Explore available resources and support systems, Assess for adequacy in community support network, Educate family and significant other(s) on suicide prevention, Complete Psychosocial Assessment, Interpersonal group therapy.  Evaluation of Outcomes: Progressing   Progress in Treatment: Attending groups: Yes. Participating in groups: Yes. Taking medication as prescribed: Yes. Toleration medication: Yes. Family/Significant other contact made: Yes, individual(s) contacted:  Karren Burly, son  Patient understands diagnosis: Yes. Discussing patient identified problems/goals with staff: Yes. Medical problems stabilized or resolved: Yes. Denies suicidal/homicidal ideation: Yes. Issues/concerns per patient self-inventory: No. Other: None   New problem(s) identified: No, Describe:  none   New Short Term/Long Term Goal(s): Update 6/30: none at this time. Update 09/27/2022:  No changes at this time.  Update 10/02/2022:  No changes at this time. Update 10/07/22: No changes at this time 10/12/22: No changes at this time Update 10/18/22: No changes at this time Update 10/23/22: No changes at this time Update 10/28/22: No changes at this time Update 11/07/22: No changes at this time Update 11/12/22: No changes at this time  Update 11/17/22: None at this time. Update 11/22/22: None at this time.    Patient Goals:  Update 6/30: none at this time. Update 09/27/2022:  No changes at this time. Update 10/02/2022:  No changes at this time. Update 10/07/22: No changes at this time 10/12/22: No changes at this time Update 10/18/22: No changes at this time Update 10/23/22: No changes at this  time Update 10/28/22: No changes at this time Update 11/07/22: No  changes at this time Update 11/12/22: No changes at this time Update 11/17/22: None at this time. Update 11/22/22: None at this time.      Discharge Plan or Barriers: Update 6/30: APS report has been made and patient is being investigated for guardianship needs.  Remains homeless with limited supports. Update 09/27/2022:  No changes at this time.  Update 10/02/2022:  Patient remains safe on the unit at this time.  Patient remains psychotic at this time.  APS report has been made, however, no follow up from the caseworker on her case.  No safe discharge identified.  CSW has requested that application for Medicaid and disability be completed.   Update 10/07/22: No changes at this time 10/12/22: No changes at this time Update 10/18/22: No changes at this time Update 10/23/22: No changes at this time Update 10/23/22: No changes at this time Update 10/28/22: According to [facility], pt's caseworker at DSS, the petition was filed for guardianship on 10/21/22, now awaiting for the petition to be approvedUpdate 11/07/22: No changes at this time Update 11/12/22: CSW sent over patients information to [facility] and [facility], awaiting a response. CSW awaiting word from DSS regarding pt's petition and what agency will become her guardian. Update 11/17/22: No changes at this time. Update 11/22/22: CSW has sent patients information to multiple facilities that were suggested by leadership, pt has been denied from the facility or the facility does not respond. CSW continues to send pt's information to nursing homes.    Reason for Continuation of Hospitalization: Hallucinations Mania Medication stabilization   Estimated Length of Stay:  Update 6/30: 1-7 days Update 09/27/2022:  TBD Update 10/02/2022:  No changes at this time. UpdaUpdate 10/18/22: No changes at this time te 10/07/22: No changes at this time Update 10/18/22: No changes at this time Update 10/23/22: No changes at this time Update 10/28/22: No changes at this time Update  11/07/22: No changes at this time Update 11/12/22: No changes at this time  Update 11/17/22: No changes at this time. Update 11/22/22: None at this time.     Last 3 [place] Suicide Severity Risk Score: Flowsheet Row Admission (Current) from 08/22/2022 in [facility] from 08/17/2022 in [facility] Emergency Department at [facility] ED from 08/12/2022 in [facility] Emergency Department at [facility]  C-SSRS RISK CATEGORY Low Risk No Risk No Risk       Last [facility] 2/9 Scores:     No data to display          Scribe for Treatment Team: Elza Rafter, Theresia Majors 11/22/2022 11:50 AM

## 2022-11-22 NOTE — Group Note (Signed)
Date:  11/22/2022 Time:  10:24 AM  Group Topic/Focus:  Goals Group:   The focus of this group is to help patients establish daily goals to achieve during treatment and discuss how the patient can incorporate goal setting into their daily lives to aide in recovery.    Participation Level:  Active  Participation Quality:  Appropriate  Affect:  Appropriate  Cognitive:  Appropriate  Insight: Appropriate  Engagement in Group:  Engaged  Modes of Intervention:  Discussion  Additional Comments:  None  Rodena Goldmann 11/22/2022, 10:24 AM

## 2022-11-22 NOTE — Progress Notes (Signed)
[facility] MD Progress Note  11/22/2022 12:38 PM Clairessa Madura  MRN:  [id number] Subjective: Betty Henderson is seen on rounds.  DSS is involved with her case and they have not been in touch with social work about any placement issues.  She has been compliant with her medications.  No side effects.  She has been pleasant and cooperative on the unit.  She has been in good controls. Principal Problem: Schizoaffective disorder, bipolar type (HCC) Diagnosis: Principal Problem:   Schizoaffective disorder, bipolar type (HCC)  Total Time spent with patient: 15 minutes  Past Psychiatric History: Schizoaffective disorder bipolar type  Past Medical History:  Past Medical History:  Diagnosis Date   Anemia    Arthritis    Chronic pain    Drug-seeking behavior    Malingering    Osteopetrosis    Psychosis (HCC)    Schizoaffective disorder, bipolar type (HCC)     Past Surgical History:  Procedure Laterality Date   MOUTH SURGERY     TUBAL LIGATION     Family History:  Family History  Family history unknown: Yes   Family Psychiatric  History: Unremarkable Social History:  Social History   Substance and Sexual Activity  Alcohol Use Not Currently   Comment: 1 cocktail 3 weeks ago     Social History   Substance and Sexual Activity  Drug Use Not Currently   Types: Cocaine   Comment: states "it's legal though"    Social History   Socioeconomic History   Marital status: Single    Spouse name: Not on file   Number of children: Not on file   Years of education: Not on file   Highest education level: Not on file  Occupational History   Not on file  Tobacco Use   Smoking status: Every Day    Current packs/day: 0.50    Types: Cigarettes   Smokeless tobacco: Never  Vaping Use   Vaping status: Never Used  Substance and Sexual Activity   Alcohol use: Not Currently    Comment: 1 cocktail 3 weeks ago   Drug use: Not Currently    Types: Cocaine    Comment: states "it's legal though"   Sexual  activity: Never  Other Topics Concern   Not on file  Social History Narrative   Not on file   Social Determinants of Health   Financial Resource Strain: Not on file  Food Insecurity: Food Insecurity Present (08/22/2022)   Hunger Vital Sign    Worried About Running Out of Food in the Last Year: Sometimes true    Ran Out of Food in the Last Year: Sometimes true  Transportation Needs: Patient Unable To Answer (08/22/2022)   PRAPARE - Transportation    Lack of Transportation (Medical): Patient unable to answer    Lack of Transportation (Non-Medical): Patient unable to answer  Recent Concern: Transportation Needs - Unmet Transportation Needs (06/13/2022)   Received from [facility], [facility]   [facility] - Transportation    Lack of Transportation (Medical): Not on file    Lack of Transportation (Non-Medical): Yes  Physical Activity: Not on file  Stress: Not on file  Social Connections: Unknown (08/06/2021)   Received from [facility], [facility]   Social Network    Social Network: Not on file   Additional Social History:                         Sleep: Good  Appetite:  Good  Current Medications: Current Facility-Administered Medications  Medication Dose Route Frequency Provider Last Rate Last Admin   acetaminophen (TYLENOL) tablet 650 mg  650 mg Oral Q6H PRN [name], DO   650 mg at 11/17/22 1859   alum & mag hydroxide-simeth (MAALOX/MYLANTA) 200-200-20 MG/5ML suspension 30 mL  30 mL Oral Q4H PRN [name], DO   30 mL at 09/20/22 1142   diphenhydrAMINE (BENADRYL) capsule 50 mg  50 mg Oral Q6H PRN [name], DO   50 mg at 09/29/22 2254   Or   diphenhydrAMINE (BENADRYL) injection 50 mg  50 mg Intramuscular Q6H PRN [name], DO       haloperidol (HALDOL) tablet 5 mg  5 mg Oral Q6H PRN [name], DO   5 mg at 09/28/22 2149   Or   haloperidol lactate (HALDOL) injection 5 mg  5 mg Intramuscular  Q6H PRN [name], DO       ibuprofen (ADVIL) tablet 600 mg  600 mg Oral Q6H PRN [name], [name], MD   600 mg at 11/19/22 1221   LORazepam (ATIVAN) tablet 1 mg  1 mg Oral TID PRN [name], DO   1 mg at 09/27/22 0025   magnesium hydroxide (MILK OF MAGNESIA) suspension 30 mL  30 mL Oral Daily PRN [name], DO   30 mL at 11/08/22 1012   paliperidone (INVEGA) 24 hr tablet 3 mg  3 mg Oral QHS [name], DO   3 mg at 11/21/22 2051   traZODone (DESYREL) tablet 50 mg  50 mg Oral QHS PRN [name], DO   50 mg at 11/21/22 2051    Lab Results: No results found for this or any previous visit (from the past 48 hour(s)).  Blood Alcohol level:  Lab Results  Component Value Date   ETH <10 08/17/2022   ETH <10 01/11/2021    Metabolic Disorder Labs: No results found for: "HGBA1C", "MPG" No results found for: "PROLACTIN" No results found for: "CHOL", "TRIG", "HDL", "CHOLHDL", "VLDL", "LDLCALC"  Physical Findings: AIMS:  , ,  ,  ,    CIWA:    COWS:     Musculoskeletal: Strength & Muscle Tone: within normal limits Gait & Station: normal Patient leans: N/A  Psychiatric Specialty Exam:  Presentation  General Appearance:  Casual; Neat  Eye Contact: Fair  Speech: Slow; Slurred  Speech Volume: Decreased  Handedness: Right   Mood and Affect  Mood: Anxious  Affect: Inappropriate   Thought Process  Thought Processes: Disorganized  Descriptions of Associations:Loose  Orientation:Partial  Thought Content:Illogical; Rumination  History of Schizophrenia/Schizoaffective disorder:No data recorded Duration of Psychotic Symptoms:No data recorded Hallucinations:No data recorded Ideas of Reference:Paranoia  Suicidal Thoughts:No data recorded Homicidal Thoughts:No data recorded  Sensorium  Memory: Immediate Fair; Remote Poor  Judgment: Poor  Insight: Poor   Executive Functions   Concentration: Poor  Attention Span: Fair  Recall: [employer] of Knowledge: Fair  Language: Fair   Psychomotor Activity  Psychomotor Activity:No data recorded  Assets  Assets: Communication Skills   Sleep  Sleep:No data recorded    Blood pressure (!) 116/52, pulse 68, temperature (!) 97.2 F (36.2 C), resp. rate 16, height 5\' 6"  (1.676 m), weight 64 kg, SpO2 98%. Body mass index is 22.76 kg/m.   Treatment Plan Summary: Daily contact with patient to assess and evaluate symptoms and progress in treatment, Medication management, and Plan continue Invega  Genesis Paget [place]sea Mall,  DO 11/22/2022, 12:38 PM

## 2022-11-23 DIAGNOSIS — F250 Schizoaffective disorder, bipolar type: Secondary | ICD-10-CM | POA: Diagnosis not present

## 2022-11-23 NOTE — Progress Notes (Signed)
   11/23/22 0600  15 Minute Checks  Location Bedroom  Visual Appearance Calm  Behavior Sleeping  Sleep (Behavioral Health Patients Only)  Calculate sleep? (Click Yes once per 24 hr at 0600 safety check) Yes  Documented sleep last 24 hours 10.5

## 2022-11-23 NOTE — Progress Notes (Signed)
   11/23/22 2100  Psych Admission Type (Psych Patients Only)  Admission Status Voluntary  Psychosocial Assessment  Patient Complaints None  Eye Contact Brief  Facial Expression Flat  Affect Appropriate to circumstance  Speech Soft  Interaction Minimal  Motor Activity Shuffling  Appearance/Hygiene Unremarkable  Behavior Characteristics Cooperative  Mood Pleasant  Thought Process  Coherency WDL  Content WDL  Delusions None reported or observed  Perception WDL  Hallucination None reported or observed  Judgment Impaired  Confusion Mild  Danger to Self  Current suicidal ideation? Denies  Agreement Not to Harm Self Yes  Description of Agreement verbal  Danger to Others  Danger to Others None reported or observed  Danger to Others Abnormal  Harmful Behavior to others No threats or harm toward other people  Destructive Behavior No threats or harm toward property

## 2022-11-23 NOTE — Group Note (Signed)
Date:  11/23/2022 Time:  3:31 PM  Group Topic/Focus:  Movement Therapy    Participation Level:  Did Not Attend    Rodena Goldmann 11/23/2022, 3:31 PM

## 2022-11-23 NOTE — Group Note (Signed)
Date:  11/23/2022 Time:  4:21 PM  Group Topic/Focus:  Outside Rec Therapy    Participation Level:  Active  Participation Quality:  Appropriate  Affect:  Appropriate  Cognitive:  Appropriate  Insight: Appropriate  Engagement in Group:  Engaged  Modes of Intervention:  Discussion  Additional Comments:  None  Rodena Goldmann 11/23/2022, 4:21 PM

## 2022-11-23 NOTE — Group Note (Signed)
Date:  11/23/2022 Time:  11:53 PM  Group Topic/Focus:  Wrap-Up Group:   The focus of this group is to help patients review their daily goal of treatment and discuss progress on daily workbooks.    Participation Level:  Active  Participation Quality:  Appropriate  Affect:  Appropriate  Cognitive:  Appropriate  Insight: Appropriate  Engagement in Group:  Engaged  Modes of Intervention:  Discussion  Additional Comments:    [name] 11/23/2022, 11:53 PM

## 2022-11-23 NOTE — Plan of Care (Signed)
  Problem: Education: Goal: Knowledge of General Education information will improve Description Including pain rating scale, medication(s)/side effects and non-pharmacologic comfort measures Outcome: Progressing   Problem: Health Behavior/Discharge Planning: Goal: Ability to manage health-related needs will improve Outcome: Progressing   

## 2022-11-23 NOTE — Group Note (Unsigned)
Date:  11/23/2022 Time:  4:24 PM  Group Topic/Focus:  Outside Rec Therapy     Participation Level:  {BHH PARTICIPATION ZOXWR:60454}  Participation Quality:  {BHH PARTICIPATION QUALITY:22265}  Affect:  {BHH AFFECT:22266}  Cognitive:  {BHH COGNITIVE:22267}  Insight: {BHH Insight2:20797}  Engagement in Group:  {BHH ENGAGEMENT IN UJWJX:91478}  Modes of Intervention:  {BHH MODES OF INTERVENTION:22269}  Additional Comments:  ***  Rodena Goldmann 11/23/2022, 4:24 PM

## 2022-11-23 NOTE — Plan of Care (Signed)
D: Pt alert and oriented. Pt denies experiencing any anxiety/depression at this time. Pt denies experiencing any pain at this time. Pt denies experiencing any SI/HI, or AVH at this time.   A: Support and encouragement provided. Frequent verbal contact made. Routine safety checks conducted q15 minutes.   R: No adverse drug reactions noted. Pt verbally contracts for safety at this time. Pt compliant treatment plan. Pt interacts well with others on the unit. Pt remains safe at this time. Plan of care ongoing.  Problem: Education: Goal: Knowledge of General Education information will improve Description: Including pain rating scale, medication(s)/side effects and non-pharmacologic comfort measures Outcome: Progressing   Problem: Health Behavior/Discharge Planning: Goal: Ability to manage health-related needs will improve Outcome: Progressing

## 2022-11-23 NOTE — Group Note (Unsigned)
Date:  11/23/2022 Time:  3:35 PM  Group Topic/Focus:  Movement therapy     Participation Level:  {BHH PARTICIPATION LEVEL:22264}  Participation Quality:  {BHH PARTICIPATION QUALITY:22265}  Affect:  {BHH AFFECT:22266}  Cognitive:  {BHH COGNITIVE:22267}  Insight: {BHH Insight2:20797}  Engagement in Group:  {BHH ENGAGEMENT IN WUJWJ:19147}  Modes of Intervention:  {BHH MODES OF INTERVENTION:22269}  Additional Comments:  ***  Rodena Goldmann 11/23/2022, 3:35 PM

## 2022-11-23 NOTE — Progress Notes (Signed)
[facility] MD Progress Note  11/23/2022 1:11 PM Betty Henderson  MRN:  [id number] Subjective: Betty Henderson is seen on rounds.  She has no complaints.  She has been in good controls.  She has been compliant with medications.  No side effects.  Nurses report no issues. Principal Problem: Schizoaffective disorder, bipolar type (HCC) Diagnosis: Principal Problem:   Schizoaffective disorder, bipolar type (HCC)  Total Time spent with patient: 15 minutes  Past Psychiatric History: Schizoaffective disorder bipolar type  Past Medical History:  Past Medical History:  Diagnosis Date   Anemia    Arthritis    Chronic pain    Drug-seeking behavior    Malingering    Osteopetrosis    Psychosis (HCC)    Schizoaffective disorder, bipolar type (HCC)     Past Surgical History:  Procedure Laterality Date   MOUTH SURGERY     TUBAL LIGATION     Family History:  Family History  Family history unknown: Yes   Family Psychiatric  History: Unremarkable Social History:  Social History   Substance and Sexual Activity  Alcohol Use Not Currently   Comment: 1 cocktail 3 weeks ago     Social History   Substance and Sexual Activity  Drug Use Not Currently   Types: Cocaine   Comment: states "it's legal though"    Social History   Socioeconomic History   Marital status: Single    Spouse name: Not on file   Number of children: Not on file   Years of education: Not on file   Highest education level: Not on file  Occupational History   Not on file  Tobacco Use   Smoking status: Every Day    Current packs/day: 0.50    Types: Cigarettes   Smokeless tobacco: Never  Vaping Use   Vaping status: Never Used  Substance and Sexual Activity   Alcohol use: Not Currently    Comment: 1 cocktail 3 weeks ago   Drug use: Not Currently    Types: Cocaine    Comment: states "it's legal though"   Sexual activity: Never  Other Topics Concern   Not on file  Social History Narrative   Not on file   Social  Determinants of Health   Financial Resource Strain: Not on file  Food Insecurity: Food Insecurity Present (08/22/2022)   Hunger Vital Sign    Worried About Running Out of Food in the Last Year: Sometimes true    Ran Out of Food in the Last Year: Sometimes true  Transportation Needs: Patient Unable To Answer (08/22/2022)   PRAPARE - Transportation    Lack of Transportation (Medical): Patient unable to answer    Lack of Transportation (Non-Medical): Patient unable to answer  Recent Concern: Transportation Needs - Unmet Transportation Needs (06/13/2022)   Received from [facility]   [facility] - Transportation    Lack of Transportation (Medical): Not on file    Lack of Transportation (Non-Medical): Yes  Physical Activity: Not on file  Stress: Not on file  Social Connections: Unknown (08/06/2021)   Received from [facility]   Social Network    Social Network: Not on file   Additional Social History:                         Sleep: Good  Appetite:  Good  Current Medications: Current Facility-Administered Medications  Medication Dose Route Frequency Provider Last Rate Last Admin   acetaminophen (TYLENOL) tablet 650 mg  650 mg Oral Q6H PRN [name], DO   650 mg at 11/17/22 1859   alum & mag hydroxide-simeth (MAALOX/MYLANTA) 200-200-20 MG/5ML suspension 30 mL  30 mL Oral Q4H PRN [name], DO   30 mL at 09/20/22 1142   diphenhydrAMINE (BENADRYL) capsule 50 mg  50 mg Oral Q6H PRN [name], DO   50 mg at 09/29/22 2254   Or   diphenhydrAMINE (BENADRYL) injection 50 mg  50 mg Intramuscular Q6H PRN [name], DO       haloperidol (HALDOL) tablet 5 mg  5 mg Oral Q6H PRN [name], DO   5 mg at 09/28/22 2149   Or   haloperidol lactate (HALDOL) injection 5 mg  5 mg Intramuscular Q6H PRN [name], DO       ibuprofen (ADVIL) tablet 600 mg  600 mg Oral Q6H PRN [name], John  T, MD   600 mg at 11/19/22 1221   LORazepam (ATIVAN) tablet 1 mg  1 mg Oral TID PRN [name], DO   1 mg at 09/27/22 0025   magnesium hydroxide (MILK OF MAGNESIA) suspension 30 mL  30 mL Oral Daily PRN [name], DO   30 mL at 11/08/22 1012   paliperidone (INVEGA) 24 hr tablet 3 mg  3 mg Oral QHS [name], DO   3 mg at 11/22/22 2120   traZODone (DESYREL) tablet 50 mg  50 mg Oral QHS PRN [name], DO   50 mg at 11/22/22 2120    Lab Results: No results found for this or any previous visit (from the past 48 hour(s)).  Blood Alcohol level:  Lab Results  Component Value Date   ETH <10 08/17/2022   ETH <10 01/11/2021    Metabolic Disorder Labs: No results found for: "HGBA1C", "MPG" No results found for: "PROLACTIN" No results found for: "CHOL", "TRIG", "HDL", "CHOLHDL", "VLDL", "LDLCALC"  Physical Findings: AIMS:  , ,  ,  ,    CIWA:    COWS:     Musculoskeletal: Strength & Muscle Tone: within normal limits Gait & Station: normal Patient leans: N/A  Psychiatric Specialty Exam:  Presentation  General Appearance:  Casual; Neat  Eye Contact: Fair  Speech: Slow; Slurred  Speech Volume: Decreased  Handedness: Right   Mood and Affect  Mood: Anxious  Affect: Inappropriate   Thought Process  Thought Processes: Disorganized  Descriptions of Associations:Loose  Orientation:Partial  Thought Content:Illogical; Rumination  History of Schizophrenia/Schizoaffective disorder:No data recorded Duration of Psychotic Symptoms:No data recorded Hallucinations:No data recorded Ideas of Reference:Paranoia  Suicidal Thoughts:No data recorded Homicidal Thoughts:No data recorded  Sensorium  Memory: Immediate Fair; Remote Poor  Judgment: Poor  Insight: Poor   Executive Functions  Concentration: Poor  Attention Span: Fair  Recall: [employer] of Knowledge: Fair  Language: Fair   Psychomotor  Activity  Psychomotor Activity:No data recorded  Assets  Assets: Communication Skills   Sleep  Sleep:No data recorded    Blood pressure (!) 111/59, pulse 63, temperature 98.4 F (36.9 C), resp. rate 17, height 5\' 6"  (1.676 m), weight 64 kg, SpO2 96%. Body mass index is 22.76 kg/m.   Treatment Plan Summary: Daily contact with patient to assess and evaluate symptoms and progress in treatment, Medication management, and Plan continue Sherwood Gambler, DO 11/23/2022, 1:11 PM

## 2022-11-24 DIAGNOSIS — F250 Schizoaffective disorder, bipolar type: Secondary | ICD-10-CM | POA: Diagnosis not present

## 2022-11-24 NOTE — Progress Notes (Signed)
   11/24/22 2200  Psych Admission Type (Psych Patients Only)  Admission Status Voluntary  Psychosocial Assessment  Patient Complaints None  Eye Contact Brief  Facial Expression Flat  Affect Flat  Speech Slurred;Soft  Interaction Minimal  Motor Activity Shuffling  Appearance/Hygiene Unremarkable  Behavior Characteristics Cooperative  Mood Pleasant  Thought Process  Coherency WDL  Content WDL  Delusions None reported or observed  Perception WDL  Hallucination None reported or observed  Judgment Limited  Confusion Mild  Danger to Self  Current suicidal ideation? Denies

## 2022-11-24 NOTE — Progress Notes (Signed)
[facility] MD Progress Note  Betty Henderson  MRN:  [id number]  Subjective: Case discussed with RN and social worker, chart reviewed, patient seen today during rounds.    Per SW, caseworker at [facility] Betty Henderson states that the court hearing should be scheduled  within the next 14 days.  After the court hearing pt will be assigned a guardian through the state.  Chart review,, patient seen today, case discussed with staff.  Per staff report patient is occasionally heard talking to herself in her room.  No behavioral issues reported by the staff. Patient reports she is doing good today.  Patient is awaiting placement.  Patient denies auditory or visual hallucinations.  Patient denies any thoughts of harming herself or others.  She reports she has been attending she reports that her sleep and appetite have improved.    Principal Problem: Schizoaffective disorder, bipolar type (HCC) Diagnosis: Principal Problem:   Schizoaffective disorder, bipolar type (HCC)   Past Psychiatric History: Schizoaffective disorder, bipolar type.  Past Medical History:  Past Medical History:  Diagnosis Date   Anemia    Arthritis    Chronic pain    Drug-seeking behavior    Malingering    Osteopetrosis    Psychosis (HCC)    Schizoaffective disorder, bipolar type (HCC)     Past Surgical History:  Procedure Laterality Date   MOUTH SURGERY     TUBAL LIGATION     Family History:  Family History  Family history unknown: Yes   Family Psychiatric  History: Unremarkable Social History:  Social History   Substance and Sexual Activity  Alcohol Use Not Currently   Comment: 1 cocktail 3 weeks ago     Social History   Substance and Sexual Activity  Drug Use Not Currently   Types: Cocaine   Comment: states "it's legal though"    Social History   Socioeconomic History   Marital status: Single    Spouse name: Not on file   Number of children: Not on file   Years of education: Not on  file   Highest education level: Not on file  Occupational History   Not on file  Tobacco Use   Smoking status: Every Day    Current packs/day: 0.50    Types: Cigarettes   Smokeless tobacco: Never  Vaping Use   Vaping status: Never Used  Substance and Sexual Activity   Alcohol use: Not Currently    Comment: 1 cocktail 3 weeks ago   Drug use: Not Currently    Types: Cocaine    Comment: states "it's legal though"   Sexual activity: Never  Other Topics Concern   Not on file  Social History Narrative   Not on file   Social Determinants of Health   Financial Resource Strain: Not on file  Food Insecurity: Food Insecurity Present (08/22/2022)   Hunger Vital Sign    Worried About Running Out of Food in the Last Year: Sometimes true    Ran Out of Food in the Last Year: Sometimes true  Transportation Needs: Patient Unable To Answer (08/22/2022)   PRAPARE - Transportation    Lack of Transportation (Medical): Patient unable to answer    Lack of Transportation (Non-Medical): Patient unable to answer  Recent Concern: Transportation Needs - Unmet Transportation Needs (06/13/2022)   Received from [facility]   [facility] - Transportation    Lack of Transportation (Medical): Not on file    Lack of Transportation (Non-Medical): Yes  Physical  Activity: Not on file  Stress: Not on file  Social Connections: Unknown (08/06/2021)   Received from [facility], [facility]   Social Network    Social Network: Not on file   Additional Social History:                         Sleep: Good  Appetite:  Good  Current Medications: Current Facility-Administered Medications  Medication Dose Route Frequency Provider Last Rate Last Admin   acetaminophen (TYLENOL) tablet 650 mg  650 mg Oral Q6H PRN [name], DO   650 mg at 11/17/22 1859   alum & mag hydroxide-simeth (MAALOX/MYLANTA) 200-200-20 MG/5ML suspension 30 mL  30 mL Oral Q4H PRN [name],  DO   30 mL at 09/20/22 1142   diphenhydrAMINE (BENADRYL) capsule 50 mg  50 mg Oral Q6H PRN [name], DO   50 mg at 09/29/22 2254   Or   diphenhydrAMINE (BENADRYL) injection 50 mg  50 mg Intramuscular Q6H PRN [name], DO       haloperidol (HALDOL) tablet 5 mg  5 mg Oral Q6H PRN [name], DO   5 mg at 09/28/22 2149   Or   haloperidol lactate (HALDOL) injection 5 mg  5 mg Intramuscular Q6H PRN [name], DO       ibuprofen (ADVIL) tablet 600 mg  600 mg Oral Q6H PRN [name], [name], MD   600 mg at 11/19/22 1221   LORazepam (ATIVAN) tablet 1 mg  1 mg Oral TID PRN [name], DO   1 mg at 09/27/22 0025   magnesium hydroxide (MILK OF MAGNESIA) suspension 30 mL  30 mL Oral Daily PRN [name], DO   30 mL at 11/08/22 1012   paliperidone (INVEGA) 24 hr tablet 3 mg  3 mg Oral QHS [name], DO   3 mg at 11/23/22 2131   traZODone (DESYREL) tablet 50 mg  50 mg Oral QHS PRN [name], DO   50 mg at 11/22/22 2120    Lab Results: No results found for this or any previous visit (from the past 48 hour(s)).  Blood Alcohol level:  Lab Results  Component Value Date   ETH <10 08/17/2022   ETH <10 01/11/2021      Musculoskeletal: Strength & Muscle Tone: within normal limits Gait & Station: normal Patient leans: N/A   Psychiatric Specialty Exam:   Presentation  General Appearance:  Casual; Neat   Eye Contact: Fair   Speech: Spontaneous   Speech Volume: Normal   Handedness: Right     Mood and Affect  Mood: " Good"   Affect: Stable    Thought Processes: Improved ,probably at baseline.    Descriptions of Associations: Intact   Orientation: Well-oriented   Thought Content:Denies SI/HI, at baseline   Hallucinations:Denies ,   Ideas of Reference:Paranoia, at baseline   Suicidal Thoughts:Denies SI Homicidal Thoughts:Denies HI   Sensorium  Memory: Immediate  Fair; Remote Poor   Judgment: Fair, pt is Rx compliant   Insight: Limited     Executive Functions  Concentration: Improved   Attention Span: Improved   Language: Fair     Psychomotor Activity  Psychomotor Activity: Normal  Assets  Assets: Communication Skills     Sleep  Sleep:Improved     Physical Exam: Physical Exam Vitals and nursing note reviewed.  Constitutional:      Appearance: Normal appearance. She  is normal weight.  Neurological:     General: No focal deficit present.     Mental Status: She is alert.      Review of Systems  Constitutional: Negative.   HENT: Negative.    Eyes: Negative.   Respiratory: Negative.    Cardiovascular: Negative.   Gastrointestinal: Negative.   Genitourinary: Negative.   Musculoskeletal: Negative.   Skin: Negative.   Neurological: Negative.   Endo/Heme/Allergies: Negative.     Blood pressure 109/69, pulse 75, temperature 98.3 F (36.8 C), resp. rate 18, height 5\' 6"  (1.676 m), weight 64 kg, SpO2 99%. Body mass index is 22.76 kg/m.   Treatment Plan Summary: Daily contact with patient to assess and evaluate symptoms and progress in treatment, Medication management, and Plan continue current medications.  Continue on Invega 3 mg Po daily    [name], MD

## 2022-11-24 NOTE — Plan of Care (Signed)
  Problem: Education: Goal: Knowledge of General Education information will improve Description: Including pain rating scale, medication(s)/side effects and non-pharmacologic comfort measures 11/24/2022 0808 by Luane School, RN Outcome: Progressing 11/23/2022 2117 by Luane School, RN Outcome: Progressing   Problem: Health Behavior/Discharge Planning: Goal: Ability to manage health-related needs will improve Outcome: Progressing   Problem: Safety: Goal: Ability to remain free from injury will improve Outcome: Progressing   Problem: Pain Managment: Goal: General experience of comfort will improve Outcome: Not Progressing

## 2022-11-24 NOTE — Group Note (Signed)
LCSW Group Therapy Note   Group Date: 11/24/2022 Start Time: 1315 End Time: 2000   Type of Therapy and Topic:  Group Therapy: Challenging Core Beliefs  Participation Level:  Active  Description of Group:  Patients were educated about core beliefs and asked to identify one harmful core belief that they have. Patients were asked to explore from where those beliefs originate. Patients were asked to discuss how those beliefs make them feel and the resulting behaviors of those beliefs. They were then be asked if those beliefs are true and, if so, what evidence they have to support them. Lastly, group members were challenged to replace those negative core beliefs with helpful beliefs.   Therapeutic Goals:   1. Patient will identify harmful core beliefs and explore the origins of such beliefs. 2. Patient will identify feelings and behaviors that result from those core beliefs. 3. Patient will discuss whether such beliefs are true. 4.  Patient will replace harmful core beliefs with helpful ones.  Summary of Patient Progress:  Betty Henderson actively engaged in processing and exploring how core beliefs are formed and how they impact thoughts, feelings, and behaviors. Patient proved open to input from peers and feedback from CSW. Patient demonstrated good insight into the subject matter, was respectful and supportive of peers, and participated throughout the entire session.  Therapeutic Modalities: Cognitive Behavioral Therapy; Solution-Focused Therapy   Elza Rafter, [place] 11/24/2022  2:33 PM

## 2022-11-24 NOTE — Progress Notes (Signed)
Patient admitted IVC on Aug 22, 2022 for worsening psychosis. She is now a voluntary admssion. Diagnosis of schizoaffective disorder, bipolar type.  She denies SI/HI. She denies AVH although patient is heard responding to internal stimuli from her bedroom. No unsafe behaviors noted. She denies depression and anxiety. Patient actively participated in group and is able to make her needs known although her speech is often soft and slurred.  Discharge planning is ongoing. Q15 minute checks in place.

## 2022-11-24 NOTE — Group Note (Signed)
Date:  11/24/2022 Time:  10:02 PM  Group Topic/Focus:  Goals Group:   The focus of this group is to help patients establish daily goals to achieve during treatment and discuss how the patient can incorporate goal setting into their daily lives to aide in recovery.    Participation Level:  Did Not Attend  Participation Quality:      Affect:      Cognitive:      Insight: None  Engagement in Group:  None  Modes of Intervention:      Additional Comments:    [name] 11/24/2022, 10:02 PM

## 2022-11-24 NOTE — Progress Notes (Signed)
   11/24/22 0600  15 Minute Checks  Location Bedroom  Visual Appearance Calm  Behavior Sleeping  Sleep (Behavioral Health Patients Only)  Calculate sleep? (Click Yes once per 24 hr at 0600 safety check) Yes  Documented sleep last 24 hours 15

## 2022-11-24 NOTE — Plan of Care (Signed)
  Problem: Education: Goal: Knowledge of General Education information will improve Description: Including pain rating scale, medication(s)/side effects and non-pharmacologic comfort measures Outcome: Progressing   Problem: Clinical Measurements: Goal: Ability to maintain clinical measurements within normal limits will improve Outcome: Progressing   

## 2022-11-25 DIAGNOSIS — F250 Schizoaffective disorder, bipolar type: Secondary | ICD-10-CM | POA: Diagnosis not present

## 2022-11-25 NOTE — Progress Notes (Signed)
[facility] MD Progress Note  11/25/2022 11:39 AM Betty Henderson  MRN:  [id number] Subjective:  [name] is seen on rounds.  We are awaiting to hear from DSS for her discharge plan.  She has been compliant with Invega.  She has been in good controls.  She has been pleasant and cooperative on the unit.  Nurses report no issues.  She has been very patient.  No side effects from her medications. Principal Problem: Schizoaffective disorder, bipolar type (HCC) Diagnosis: Principal Problem:   Schizoaffective disorder, bipolar type (HCC)  Total Time spent with patient: 15 minutes  Past Psychiatric History: Long history of schizoaffective disorder  Past Medical History:  Past Medical History:  Diagnosis Date   Anemia    Arthritis    Chronic pain    Drug-seeking behavior    Malingering    Osteopetrosis    Psychosis (HCC)    Schizoaffective disorder, bipolar type (HCC)     Past Surgical History:  Procedure Laterality Date   MOUTH SURGERY     TUBAL LIGATION     Family History:  Family History  Family history unknown: Yes   Family Psychiatric  History: Unremarkable Social History:  Social History   Substance and Sexual Activity  Alcohol Use Not Currently   Comment: 1 cocktail 3 weeks ago     Social History   Substance and Sexual Activity  Drug Use Not Currently   Types: Cocaine   Comment: states "it's legal though"    Social History   Socioeconomic History   Marital status: Single    Spouse name: Not on file   Number of children: Not on file   Years of education: Not on file   Highest education level: Not on file  Occupational History   Not on file  Tobacco Use   Smoking status: Every Day    Current packs/day: 0.50    Types: Cigarettes   Smokeless tobacco: Never  Vaping Use   Vaping status: Never Used  Substance and Sexual Activity   Alcohol use: Not Currently    Comment: 1 cocktail 3 weeks ago   Drug use: Not Currently    Types: Cocaine    Comment: states "it's  legal though"   Sexual activity: Never  Other Topics Concern   Not on file  Social History Narrative   Not on file   Social Determinants of Health   Financial Resource Strain: Not on file  Food Insecurity: Food Insecurity Present (08/22/2022)   Hunger Vital Sign    Worried About Running Out of Food in the Last Year: Sometimes true    Ran Out of Food in the Last Year: Sometimes true  Transportation Needs: Patient Unable To Answer (08/22/2022)   PRAPARE - Transportation    Lack of Transportation (Medical): Patient unable to answer    Lack of Transportation (Non-Medical): Patient unable to answer  Recent Concern: Transportation Needs - Unmet Transportation Needs (06/13/2022)   Received from [facility]   [facility] - Transportation    Lack of Transportation (Medical): Not on file    Lack of Transportation (Non-Medical): Yes  Physical Activity: Not on file  Stress: Not on file  Social Connections: Unknown (08/06/2021)   Received from [facility], [facility]   Social Network    Social Network: Not on file   Additional Social History:                         Sleep:  Good  Appetite:  Good  Current Medications: Current Facility-Administered Medications  Medication Dose Route Frequency Provider Last Rate Last Admin   acetaminophen (TYLENOL) tablet 650 mg  650 mg Oral Q6H PRN [name], DO   650 mg at 11/17/22 1859   alum & mag hydroxide-simeth (MAALOX/MYLANTA) 200-200-20 MG/5ML suspension 30 mL  30 mL Oral Q4H PRN [name], DO   30 mL at 09/20/22 1142   diphenhydrAMINE (BENADRYL) capsule 50 mg  50 mg Oral Q6H PRN [name], DO   50 mg at 09/29/22 2254   Or   diphenhydrAMINE (BENADRYL) injection 50 mg  50 mg Intramuscular Q6H PRN [name], DO       haloperidol (HALDOL) tablet 5 mg  5 mg Oral Q6H PRN [name], DO   5 mg at 09/28/22 2149   Or   haloperidol lactate (HALDOL) injection 5  mg  5 mg Intramuscular Q6H PRN [name], DO       ibuprofen (ADVIL) tablet 600 mg  600 mg Oral Q6H PRN [name], [name], MD   600 mg at 11/24/22 2119   LORazepam (ATIVAN) tablet 1 mg  1 mg Oral TID PRN [name], DO   1 mg at 09/27/22 0025   magnesium hydroxide (MILK OF MAGNESIA) suspension 30 mL  30 mL Oral Daily PRN [name], DO   30 mL at 11/08/22 1012   paliperidone (INVEGA) 24 hr tablet 3 mg  3 mg Oral QHS [name], DO   3 mg at 11/24/22 2119   traZODone (DESYREL) tablet 50 mg  50 mg Oral QHS PRN [name], DO   50 mg at 11/24/22 2119    Lab Results: No results found for this or any previous visit (from the past 48 hour(s)).  Blood Alcohol level:  Lab Results  Component Value Date   ETH <10 08/17/2022   ETH <10 01/11/2021    Metabolic Disorder Labs: No results found for: "HGBA1C", "MPG" No results found for: "PROLACTIN" No results found for: "CHOL", "TRIG", "HDL", "CHOLHDL", "VLDL", "LDLCALC"  Physical Findings: AIMS:  , ,  ,  ,    CIWA:    COWS:     Musculoskeletal: Strength & Muscle Tone: within normal limits Gait & Station: normal Patient leans: N/A  Psychiatric Specialty Exam:  Presentation  General Appearance:  Casual; Neat  Eye Contact: Fair  Speech: Slow; Slurred  Speech Volume: Decreased  Handedness: Right   Mood and Affect  Mood: Anxious  Affect: Inappropriate   Thought Process  Thought Processes: Disorganized  Descriptions of Associations:Loose  Orientation:Partial  Thought Content:Illogical; Rumination  History of Schizophrenia/Schizoaffective disorder:No data recorded Duration of Psychotic Symptoms:No data recorded Hallucinations:No data recorded Ideas of Reference:Paranoia  Suicidal Thoughts:No data recorded Homicidal Thoughts:No data recorded  Sensorium  Memory: Immediate Fair; Remote Poor  Judgment: Poor  Insight: Poor   Executive  Functions  Concentration: Poor  Attention Span: Fair  Recall: [employer] of Knowledge: Fair  Language: Fair   Psychomotor Activity  Psychomotor Activity:No data recorded  Assets  Assets: Communication Skills   Sleep  Sleep:No data recorded   Blood pressure 126/63, pulse (!) 58, temperature 98.1 F (36.7 C), resp. rate 14, height 5\' 6"  (1.676 m), weight 64 kg, SpO2 94%. Body mass index is 22.76 kg/m.   Treatment Plan Summary: Daily contact with patient to assess and evaluate symptoms and progress in treatment, Medication management, and Plan continue current  medication.  [name], DO 11/25/2022, 11:39 AM

## 2022-11-25 NOTE — Group Note (Signed)
Date:  11/25/2022 Time:  11:24 PM  Group Topic/Focus:  Developing a Wellness Toolbox:   The focus of this group is to help patients develop a "wellness toolbox" with skills and strategies to promote recovery upon discharge.    Participation Level:  Active  Participation Quality:  Appropriate  Affect:  Appropriate  Cognitive:  Oriented  Insight: Improving  Engagement in Group:  Improving  Modes of Intervention:  Discussion  Additional Comments:    [facility] 11/25/2022, 11:24 PM

## 2022-11-25 NOTE — Progress Notes (Signed)
Patient is a voluntary admission to [facility] for SAD/ Biploar with visual and auditory hallucinations. She mainly isolates to her room, but comes out for meals.  Is not participating in group, but interacts well with staff and peers during meals. Currently denies S/I, H/I and AVH but is observed internally stimulated and responding to voices. Patient is currently awaiting for court appointed guardian through DSS. Court hearing is scheduled for within the next two weeks.Will continue to monitor.

## 2022-11-25 NOTE — Progress Notes (Signed)
Nursing Shift Note:  1900-0700  Attended Evening Group: yes Medication Compliant:  yes Behavior: cooperative; pleasant Sleep Quality: good Significant Changes: none noted   11/25/22 2200  Psych Admission Type (Psych Patients Only)  Admission Status Voluntary  Psychosocial Assessment  Patient Complaints None  Eye Contact Brief  Facial Expression Flat  Affect Flat  Speech Slurred  Interaction Minimal  Motor Activity Shuffling  Appearance/Hygiene Unremarkable  Behavior Characteristics Cooperative  Mood Pleasant  Thought Process  Coherency WDL  Content WDL  Delusions None reported or observed  Perception WDL  Hallucination None reported or observed  Judgment Limited  Confusion Mild  Danger to Self  Current suicidal ideation? Denies  Danger to Others  Danger to Others None reported or observed  Danger to Others Abnormal  Harmful Behavior to others No threats or harm toward other people  Destructive Behavior No threats or harm toward property

## 2022-11-26 DIAGNOSIS — F250 Schizoaffective disorder, bipolar type: Secondary | ICD-10-CM | POA: Diagnosis not present

## 2022-11-26 NOTE — Group Note (Signed)
Recreation Therapy Group Note   Group Topic:General Recreation  Group Date: 11/26/2022 Start Time: 1400 End Time: 1450 Facilitators: Rosina Lowenstein, LRT, CTRS Location: Courtyard  Group Description: Outdoor Recreation. Patients had the option to play ring toss, corn hole or sit and listen to music while outside in the courtyard getting fresh air and sunlight. LRT and pts discussed things that they enjoy doing in their free time outside of the hospital.   Goal Area(s) Addressed: Patient will identify leisure interests.  Patient will practice healthy decision making. Patient will engage in recreation activity.   Affect/Mood: Appropriate and Flat   Participation Level: Moderate   Participation Quality: Independent   Behavior: Calm   Speech/Thought Process: Coherent   Insight: Fair   Judgement: Good   Modes of Intervention: Activity   Patient Response to Interventions:  Receptive   Education Outcome:  Acknowledges education   Clinical Observations/Individualized Feedback: Layah was mostly active in their participation of session activities and group discussion. Pt identified "love and happiness" as a song she wanted to hear while outside. Pt chose to listen to music while outside. Pt interacted well with LRT and peers duration of session.   Plan: Continue to engage patient in RT group sessions 2-3x/week.   Rosina Lowenstein, LRT, CTRS 11/26/2022 3:13 PM

## 2022-11-26 NOTE — Progress Notes (Signed)
   11/26/22 2000  Psych Admission Type (Psych Patients Only)  Admission Status Voluntary  Psychosocial Assessment  Patient Complaints None  Eye Contact Brief  Facial Expression Flat  Affect Flat  Speech Soft;Slurred  Interaction Minimal  Motor Activity Shuffling  Appearance/Hygiene Unremarkable  Behavior Characteristics Cooperative  Mood Pleasant  Thought Process  Coherency WDL  Content WDL  Delusions None reported or observed  Perception WDL  Hallucination None reported or observed  Judgment Limited  Confusion Mild  Danger to Self  Current suicidal ideation? Denies  Agreement Not to Harm Self Yes  Description of Agreement verbal  Danger to Others  Danger to Others None reported or observed  Danger to Others Abnormal  Harmful Behavior to others No threats or harm toward other people  Destructive Behavior No threats or harm toward property

## 2022-11-26 NOTE — Group Note (Unsigned)
Date:  11/26/2022 Time:  9:14 AM  Group Topic/Focus:  Making Healthy Choices:   The focus of this group is to help patients identify negative/unhealthy choices they were using prior to admission and identify positive/healthier coping strategies to replace them upon discharge.     Participation Level:  {BHH PARTICIPATION ZOXWR:60454}  Participation Quality:  {BHH PARTICIPATION QUALITY:22265}  Affect:  {BHH AFFECT:22266}  Cognitive:  {BHH COGNITIVE:22267}  Insight: {BHH Insight2:20797}  Engagement in Group:  {BHH ENGAGEMENT IN UJWJX:91478}  Modes of Intervention:  {BHH MODES OF INTERVENTION:22269}  Additional Comments:  ***  Rodena Goldmann 11/26/2022, 9:14 AM

## 2022-11-26 NOTE — Group Note (Signed)
Date:  11/26/2022 Time:  9:11 AM  Group Topic/Focus:  Making Healthy Choices:   The focus of this group is to help patients identify negative/unhealthy choices they were using prior to admission and identify positive/healthier coping strategies to replace them upon discharge.    Participation Level:  Did Not Attend    Rodena Goldmann 11/26/2022, 9:11 AM

## 2022-11-26 NOTE — Group Note (Signed)
LCSW Group Therapy Note  Group Date: 11/26/2022 Start Time: 1330 End Time: 1400   Type of Therapy and Topic:  Group Therapy - Healthy vs Unhealthy Coping Skills  Participation Level:  Did Not Attend   Description of Group The focus of this group was to determine what unhealthy coping techniques typically are used by group members and what healthy coping techniques would be helpful in coping with various problems. Patients were guided in becoming aware of the differences between healthy and unhealthy coping techniques. Patients were asked to identify 2-3 healthy coping skills they would like to learn to use more effectively.  Therapeutic Goals Patients learned that coping is what human beings do all day long to deal with various situations in their lives Patients defined and discussed healthy vs unhealthy coping techniques Patients identified their preferred coping techniques and identified whether these were healthy or unhealthy Patients determined 2-3 healthy coping skills they would like to become more familiar with and use more often. Patients provided support and ideas to each other   Summary of Patient Progress:  X  Therapeutic Modalities Cognitive Behavioral Therapy Motivational Interviewing  Elza Rafter, [place] 11/26/2022  1:59 PM

## 2022-11-26 NOTE — Group Note (Signed)
Date:  11/26/2022 Time:  10:58 AM  Group Topic/Focus:  Wellness Toolbox:   The focus of this group is to discuss various aspects of wellness, balancing those aspects and exploring ways to increase the ability to experience wellness.  Patients will create a wellness toolbox for use upon discharge.    Participation Level:  Did Not Attend   Lynelle Smoke [facility] 11/26/2022, 10:58 AM

## 2022-11-26 NOTE — Plan of Care (Signed)
  Problem: Education: Goal: Knowledge of General Education information will improve Description Including pain rating scale, medication(s)/side effects and non-pharmacologic comfort measures Outcome: Progressing   Problem: Health Behavior/Discharge Planning: Goal: Ability to manage health-related needs will improve Outcome: Progressing   

## 2022-11-26 NOTE — Progress Notes (Signed)
Patient was admitted to [facility] for SAD/ Bipolar with AVH. Patient was not as isolative participating in group activities. Laying in the dayroom watching tv with her peers. Calm, cooperative, and friendly with staff and peers. Denies S/I, H/I and AVH and less internal stimulation today. Will continue to monitor.

## 2022-11-26 NOTE — BHH Counselor (Signed)
CSW contacted caseworker at [facility] Quincy Carnes at [phone] to discuss status of pt's guardianship.   CSW unable to reach, LVM requesting return call.   Reynaldo Minium, MSW, [facility] 11/26/2022 11:43 AM

## 2022-11-26 NOTE — Progress Notes (Signed)
[facility] MD Progress Note  [facility]  MRN:  [id number]  Subjective: Case discussed with RN and social worker, chart reviewed, patient seen today during rounds.    Chart review,, patient seen today, case discussed with staff.  No behavioral issues reported by the staff. Patient reports she is doing good today.  Patient is awaiting placement.  Patient denies auditory or visual hallucinations.  Patient denies any thoughts of harming herself or others.  She reports she has been attending she reports that her sleep and appetite have improved.    Principal Problem: Schizoaffective disorder, bipolar type (HCC) Diagnosis: Principal Problem:   Schizoaffective disorder, bipolar type (HCC)   Past Psychiatric History: Schizoaffective disorder, bipolar type.  Past Medical History:  Past Medical History:  Diagnosis Date   Anemia    Arthritis    Chronic pain    Drug-seeking behavior    Malingering    Osteopetrosis    Psychosis (HCC)    Schizoaffective disorder, bipolar type (HCC)     Past Surgical History:  Procedure Laterality Date   MOUTH SURGERY     TUBAL LIGATION     Family History:  Family History  Family history unknown: Yes   Family Psychiatric  History: Unremarkable Social History:  Social History   Substance and Sexual Activity  Alcohol Use Not Currently   Comment: 1 cocktail 3 weeks ago     Social History   Substance and Sexual Activity  Drug Use Not Currently   Types: Cocaine   Comment: states "it's legal though"    Social History   Socioeconomic History   Marital status: Single    Spouse name: Not on file   Number of children: Not on file   Years of education: Not on file   Highest education level: Not on file  Occupational History   Not on file  Tobacco Use   Smoking status: Every Day    Current packs/day: 0.50    Types: Cigarettes   Smokeless tobacco: Never  Vaping Use   Vaping status: Never Used  Substance and Sexual Activity   Alcohol use:  Not Currently    Comment: 1 cocktail 3 weeks ago   Drug use: Not Currently    Types: Cocaine    Comment: states "it's legal though"   Sexual activity: Never  Other Topics Concern   Not on file  Social History Narrative   Not on file   Social Determinants of Health   Financial Resource Strain: Not on file  Food Insecurity: Food Insecurity Present (08/22/2022)   Hunger Vital Sign    Worried About Running Out of Food in the Last Year: Sometimes true    Ran Out of Food in the Last Year: Sometimes true  Transportation Needs: Patient Unable To Answer (08/22/2022)   PRAPARE - Transportation    Lack of Transportation (Medical): Patient unable to answer    Lack of Transportation (Non-Medical): Patient unable to answer  Recent Concern: Transportation Needs - Unmet Transportation Needs (06/13/2022)   Received from [facility], [facility]   [facility] - Transportation    Lack of Transportation (Medical): Not on file    Lack of Transportation (Non-Medical): Yes  Physical Activity: Not on file  Stress: Not on file  Social Connections: Unknown (08/06/2021)   Received from [facility], [facility]   Social Network    Social Network: Not on file   Additional Social History:  Sleep: Good  Appetite:  Good  Current Medications: Current Facility-Administered Medications  Medication Dose Route Frequency Provider Last Rate Last Admin   acetaminophen (TYLENOL) tablet 650 mg  650 mg Oral Q6H PRN [name], DO   650 mg at 11/17/22 1859   alum & mag hydroxide-simeth (MAALOX/MYLANTA) 200-200-20 MG/5ML suspension 30 mL  30 mL Oral Q4H PRN [name], DO   30 mL at 09/20/22 1142   diphenhydrAMINE (BENADRYL) capsule 50 mg  50 mg Oral Q6H PRN [name], DO   50 mg at 09/29/22 2254   Or   diphenhydrAMINE (BENADRYL) injection 50 mg  50 mg Intramuscular Q6H PRN [name], DO       haloperidol (HALDOL) tablet 5 mg   5 mg Oral Q6H PRN [name], DO   5 mg at 09/28/22 2149   Or   haloperidol lactate (HALDOL) injection 5 mg  5 mg Intramuscular Q6H PRN [name], DO       ibuprofen (ADVIL) tablet 600 mg  600 mg Oral Q6H PRN [name], [name], MD   600 mg at 11/25/22 2131   LORazepam (ATIVAN) tablet 1 mg  1 mg Oral TID PRN [name], DO   1 mg at 09/27/22 0025   magnesium hydroxide (MILK OF MAGNESIA) suspension 30 mL  30 mL Oral Daily PRN [name], DO   30 mL at 11/08/22 1012   paliperidone (INVEGA) 24 hr tablet 3 mg  3 mg Oral QHS [name], DO   3 mg at 11/25/22 2131   traZODone (DESYREL) tablet 50 mg  50 mg Oral QHS PRN [name], DO   50 mg at 11/25/22 2131    Lab Results: No results found for this or any previous visit (from the past 48 hour(s)).  Blood Alcohol level:  Lab Results  Component Value Date   ETH <10 08/17/2022   ETH <10 01/11/2021      Musculoskeletal: Strength & Muscle Tone: within normal limits Gait & Station: normal Patient leans: N/A   Psychiatric Specialty Exam:   Presentation  General Appearance:  Casual; Neat   Eye Contact: Fair   Speech: Spontaneous   Speech Volume: Normal   Handedness: Right     Mood and Affect  Mood: " Good"   Affect: Stable    Thought Processes: Improved ,probably at baseline.    Descriptions of Associations: Intact   Orientation: Well-oriented   Thought Content:Denies SI/HI, at baseline   Hallucinations:Denies ,   Ideas of Reference:Paranoia, at baseline   Suicidal Thoughts:Denies SI Homicidal Thoughts:Denies HI   Sensorium  Memory: Immediate Fair; Remote Poor   Judgment: Fair, pt is Rx compliant   Insight: Limited     Executive Functions  Concentration: Improved   Attention Span: Improved   Language: Fair     Psychomotor Activity  Psychomotor Activity: Normal  Assets  Assets: Communication Skills     Sleep   Sleep:Improved     Physical Exam: Physical Exam Vitals and nursing note reviewed.  Constitutional:      Appearance: Normal appearance. She is normal weight.  Neurological:     General: No focal deficit present.     Mental Status: She is alert.      Review of Systems  Constitutional: Negative.   HENT: Negative.    Eyes: Negative.   Respiratory: Negative.    Cardiovascular: Negative.   Gastrointestinal: Negative.   Genitourinary: Negative.   Musculoskeletal:  Negative.   Skin: Negative.   Neurological: Negative.   Endo/Heme/Allergies: Negative.     Blood pressure 102/65, pulse 71, temperature 98.5 F (36.9 C), resp. rate 14, height 5\' 6"  (1.676 m), weight 64 kg, SpO2 97%. Body mass index is 22.76 kg/m.   Treatment Plan Summary: Daily contact with patient to assess and evaluate symptoms and progress in treatment, Medication management, and Plan continue current medications.  Continue to monitor patient on as needed meds Patient has received Invega Sustenna 156 mg IM on 11/13/2022, the previous dose was was given on 10/15/2022.   Patient received another dose of Invega Sustenna 156 mg IM on 11/18/2022, ordered by Dr. Marlou Porch, This was discussed with pharmacist today.  The incident was reported to safety zone, to prevent an incident like this in the future.  Will discontinue p.o. Invega Patient was encouraged to drink fluids and keep herself hydrated Will get EKG to monitor QTc interval    [name], MD

## 2022-11-27 DIAGNOSIS — F250 Schizoaffective disorder, bipolar type: Secondary | ICD-10-CM | POA: Diagnosis not present

## 2022-11-27 NOTE — Progress Notes (Signed)
[facility] MD Progress Note  [facility]  MRN:  [id number]  Subjective: Case discussed with RN and social worker, chart reviewed, patient seen today during rounds.    Chart review,, patient seen today, case discussed with staff.  No behavioral issues reported by the staff. Patient reports she is doing good.  Patient is awaiting placement.  Patient denies auditory or visual hallucinations.  Patient denies any thoughts of harming herself or others.  She reports she has been attending she reports that her sleep and appetite have improved.    Principal Problem: Schizoaffective disorder, bipolar type (HCC) Diagnosis: Principal Problem:   Schizoaffective disorder, bipolar type (HCC)   Past Psychiatric History: Schizoaffective disorder, bipolar type.  Past Medical History:  Past Medical History:  Diagnosis Date   Anemia    Arthritis    Chronic pain    Drug-seeking behavior    Malingering    Osteopetrosis    Psychosis (HCC)    Schizoaffective disorder, bipolar type (HCC)     Past Surgical History:  Procedure Laterality Date   MOUTH SURGERY     TUBAL LIGATION     Family History:  Family History  Family history unknown: Yes   Family Psychiatric  History: Unremarkable Social History:  Social History   Substance and Sexual Activity  Alcohol Use Not Currently   Comment: 1 cocktail 3 weeks ago     Social History   Substance and Sexual Activity  Drug Use Not Currently   Types: Cocaine   Comment: states "it's legal though"    Social History   Socioeconomic History   Marital status: Single    Spouse name: Not on file   Number of children: Not on file   Years of education: Not on file   Highest education level: Not on file  Occupational History   Not on file  Tobacco Use   Smoking status: Every Day    Current packs/day: 0.50    Types: Cigarettes   Smokeless tobacco: Never  Vaping Use   Vaping status: Never Used  Substance and Sexual Activity   Alcohol use: Not  Currently    Comment: 1 cocktail 3 weeks ago   Drug use: Not Currently    Types: Cocaine    Comment: states "it's legal though"   Sexual activity: Never  Other Topics Concern   Not on file  Social History Narrative   Not on file   Social Determinants of Health   Financial Resource Strain: Not on file  Food Insecurity: Food Insecurity Present (08/22/2022)   Hunger Vital Sign    Worried About Running Out of Food in the Last Year: Sometimes true    Ran Out of Food in the Last Year: Sometimes true  Transportation Needs: Patient Unable To Answer (08/22/2022)   PRAPARE - Transportation    Lack of Transportation (Medical): Patient unable to answer    Lack of Transportation (Non-Medical): Patient unable to answer  Recent Concern: Transportation Needs - Unmet Transportation Needs (06/13/2022)   Received from [facility], [facility]   [facility] - Transportation    Lack of Transportation (Medical): Not on file    Lack of Transportation (Non-Medical): Yes  Physical Activity: Not on file  Stress: Not on file  Social Connections: Unknown (08/06/2021)   Received from [facility], [facility]   Social Network    Social Network: Not on file   Additional Social History:  Sleep: Good  Appetite:  Good  Current Medications: Current Facility-Administered Medications  Medication Dose Route Frequency Provider Last Rate Last Admin   acetaminophen (TYLENOL) tablet 650 mg  650 mg Oral Q6H PRN [name], DO   650 mg at 11/17/22 1859   alum & mag hydroxide-simeth (MAALOX/MYLANTA) 200-200-20 MG/5ML suspension 30 mL  30 mL Oral Q4H PRN [name], DO   30 mL at 09/20/22 1142   diphenhydrAMINE (BENADRYL) capsule 50 mg  50 mg Oral Q6H PRN [name], DO   50 mg at 09/29/22 2254   Or   diphenhydrAMINE (BENADRYL) injection 50 mg  50 mg Intramuscular Q6H PRN [name], DO       haloperidol (HALDOL) tablet 5 mg  5  mg Oral Q6H PRN [name], DO   5 mg at 09/28/22 2149   Or   haloperidol lactate (HALDOL) injection 5 mg  5 mg Intramuscular Q6H PRN [name], DO       ibuprofen (ADVIL) tablet 600 mg  600 mg Oral Q6H PRN [name], [name], MD   600 mg at 11/25/22 2131   LORazepam (ATIVAN) tablet 1 mg  1 mg Oral TID PRN [name], DO   1 mg at 09/27/22 0025   magnesium hydroxide (MILK OF MAGNESIA) suspension 30 mL  30 mL Oral Daily PRN [name], DO   30 mL at 11/08/22 1012   traZODone (DESYREL) tablet 50 mg  50 mg Oral QHS PRN [name], DO   50 mg at 11/26/22 2202    Lab Results: No results found for this or any previous visit (from the past 48 hour(s)).  Blood Alcohol level:  Lab Results  Component Value Date   ETH <10 08/17/2022   ETH <10 01/11/2021      Musculoskeletal: Strength & Muscle Tone: within normal limits Gait & Station: normal Patient leans: N/A   Psychiatric Specialty Exam:   Presentation  General Appearance:  Casual; Neat   Eye Contact: Fair   Speech: Spontaneous   Speech Volume: Normal   Handedness: Right     Mood and Affect  Mood: " Good"   Affect: Stable    Thought Processes: Improved ,probably at baseline.    Descriptions of Associations: Intact   Orientation: Well-oriented   Thought Content:Denies SI/HI, at baseline   Hallucinations:Denies ,   Ideas of Reference:Paranoia, at baseline   Suicidal Thoughts:Denies SI Homicidal Thoughts:Denies HI   Sensorium  Memory: Immediate Fair; Remote Poor   Judgment: Fair, pt is Rx compliant   Insight: Limited     Executive Functions  Concentration: Improved   Attention Span: Improved   Language: Fair     Psychomotor Activity  Psychomotor Activity: Normal  Assets  Assets: Communication Skills     Sleep  Sleep:Improved     Physical Exam: Physical Exam Vitals and nursing note reviewed.  Constitutional:       Appearance: Normal appearance. She is normal weight.  Neurological:     General: No focal deficit present.     Mental Status: She is alert.      Review of Systems  Constitutional: Negative.   HENT: Negative.    Eyes: Negative.   Respiratory: Negative.    Cardiovascular: Negative.   Gastrointestinal: Negative.   Genitourinary: Negative.   Musculoskeletal: Negative.   Skin: Negative.   Neurological: Negative.   Endo/Heme/Allergies: Negative.     Blood pressure 118/68, pulse 66, temperature (!) 97.5  F (36.4 C), resp. rate 18, height 5\' 6"  (1.676 m), weight 64 kg, SpO2 95%. Body mass index is 22.76 kg/m.   Treatment Plan Summary: Daily contact with patient to assess and evaluate symptoms and progress in treatment, Medication management, and Plan continue current medications.  Continue to monitor patient on as needed meds Patient has received Invega Sustenna 156 mg IM on 11/13/2022, the previous dose was was given on 10/15/2022.   Patient received another dose of Invega Sustenna 156 mg IM on 11/18/2022 This was discussed with pharmacist today.  The incident was reported to safety zone, to prevent an incident like this in the future.  Discontinued p.o. Invega Patient was encouraged to drink fluids and keep herself hydrated QTc interval 431    [name], MD

## 2022-11-27 NOTE — Plan of Care (Signed)
  Problem: Pain Managment: Goal: General experience of comfort will improve 11/27/2022 0740 by Luane School, RN Outcome: Progressing 11/27/2022 0739 by Luane School, RN Outcome: Progressing   Problem: Safety: Goal: Ability to remain free from injury will improve Outcome: Progressing   Problem: Elimination: Goal: Will not experience complications related to urinary retention 11/27/2022 0740 by Luane School, RN Outcome: Progressing 11/27/2022 0739 by Luane School, RN Outcome: Progressing   Problem: Elimination: Goal: Will not experience complications related to bowel motility 11/27/2022 0740 by Luane School, RN Outcome: Progressing 11/27/2022 0739 by Luane School, RN Outcome: Progressing

## 2022-11-27 NOTE — Progress Notes (Signed)
Patient admitted [facility] on Aug 22, 2022 for worsening psychosis. She is now a voluntary admssion. Diagnosis of schizoaffective disorder, bipolar type.   She denies SI/HI/AVH. She denies anxiety and depression. Affect flat. Mood pleasant. Speech soft but slurred. Patient participated in rec therapy and group. Patient has no scheduled medications for either shift but will occasionally ask for PRN Tylenol or Trazodone.  EKG performed as per MD order and flagged in chart.  Discharge planning continues to be ongoing. Q15 minute unit checks in place.

## 2022-11-27 NOTE — Group Note (Signed)
Recreation Therapy Group Note   Group Topic:Healthy Support Systems  Group Date: 11/27/2022 Start Time: 1400 End Time: 1455 Facilitators: Rosina Lowenstein, LRT, CTRS Location: Courtyard  Group Description: Emotional Check in. Patient sat and talked with LRT about how they are doing and whatever else is on their mind. LRT provided active listening, reassurance and encouragement. Pts were given the opportunity to listen to music or play cornhole while getting fresh air and sunlight in the courtyard.    Goal Area(s) Addressed: Patient will engage in conversation with LRT. Patient will communicate their wants, needs, or questions.  Patient will practice a new coping skill of "talking to someone".   Affect/Mood: Appropriate   Participation Level: Active and Engaged   Participation Quality: Independent   Behavior: Calm and Cooperative   Speech/Thought Process: Coherent   Insight: Good   Judgement: Good   Modes of Intervention: Activity   Patient Response to Interventions:  Receptive   Education Outcome:  Acknowledges education   Clinical Observations/Individualized Feedback: Betty Henderson was active in their participation of session activities and group discussion. Pt chose to sing and listen to music while outside in the courtyard. Pt shared that she was doing "fine". Pt interacted well with LRT and peers duration of session.  Plan: Continue to engage patient in RT group sessions 2-3x/week.   Rosina Lowenstein, LRT, CTRS 11/27/2022 3:28 PM

## 2022-11-27 NOTE — Group Note (Signed)
Date:  11/27/2022 Time:  2:17 PM  Group Topic/Focus:  Building Self Esteem:   The Focus of this group is helping patients become aware of the effects of self-esteem on their lives, the things they and others do that enhance or undermine their self-esteem, seeing the relationship between their level of self-esteem and the choices they make and learning ways to enhance self-esteem. Self Care:   The focus of this group is to help patients understand the importance of self-care in order to improve or restore emotional, physical, spiritual, interpersonal, and financial health.    Participation Level:  Active  Participation Quality:  Appropriate, Attentive, Sharing, and Supportive  Affect:  Appropriate  Cognitive:  Alert, Appropriate, and Oriented  Insight: Appropriate, Good, and Improving  Engagement in Group:  Engaged and Improving  Modes of Intervention:  Activity  Additional Comments:     Alexis Frock 11/27/2022, 2:17 PM

## 2022-11-27 NOTE — BH IP Treatment Plan (Signed)
Interdisciplinary Treatment and Diagnostic Plan Update  11/27/2022 Time of Session: 9:00 AM  Christlyn Ammar MRN: [id number]  Principal Diagnosis: Schizoaffective disorder, bipolar type (HCC)  Secondary Diagnoses: Principal Problem:   Schizoaffective disorder, bipolar type (HCC)   Current Medications:  Current Facility-Administered Medications  Medication Dose Route Frequency Provider Last Rate Last Admin   acetaminophen (TYLENOL) tablet 650 mg  650 mg Oral Q6H PRN [name], DO   650 mg at 11/17/22 1859   alum & mag hydroxide-simeth (MAALOX/MYLANTA) 200-200-20 MG/5ML suspension 30 mL  30 mL Oral Q4H PRN [name], DO   30 mL at 09/20/22 1142   diphenhydrAMINE (BENADRYL) capsule 50 mg  50 mg Oral Q6H PRN [name], DO   50 mg at 09/29/22 2254   Or   diphenhydrAMINE (BENADRYL) injection 50 mg  50 mg Intramuscular Q6H PRN [name], DO       haloperidol (HALDOL) tablet 5 mg  5 mg Oral Q6H PRN [name], DO   5 mg at 09/28/22 2149   Or   haloperidol lactate (HALDOL) injection 5 mg  5 mg Intramuscular Q6H PRN [name], DO       ibuprofen (ADVIL) tablet 600 mg  600 mg Oral Q6H PRN [name], [name], MD   600 mg at 11/25/22 2131   LORazepam (ATIVAN) tablet 1 mg  1 mg Oral TID PRN [name], DO   1 mg at 09/27/22 0025   magnesium hydroxide (MILK OF MAGNESIA) suspension 30 mL  30 mL Oral Daily PRN [name], DO   30 mL at 11/08/22 1012   traZODone (DESYREL) tablet 50 mg  50 mg Oral QHS PRN [name], DO   50 mg at 11/26/22 2202   PTA Medications: Medications Prior to Admission  Medication Sig Dispense Refill Last Dose   ondansetron (ZOFRAN) 4 MG tablet Take 1 tablet (4 mg total) by mouth every 8 (eight) hours as needed for nausea or vomiting. (Patient not taking: Reported on 08/17/2022) 20 tablet 0     Patient Stressors: Financial difficulties   Health problems    Medication change or noncompliance    Patient Strengths: Ability for insight   Treatment Modalities: Medication Management, Group therapy, Case management,  1 to 1 session with clinician, Psychoeducation, Recreational therapy.   Physician Treatment Plan for Primary Diagnosis: Schizoaffective disorder, bipolar type (HCC) Long Term Goal(s): Improvement in symptoms so as ready for discharge   Short Term Goals: Ability to identify changes in lifestyle to reduce recurrence of condition will improve Ability to verbalize feelings will improve Ability to disclose and discuss suicidal ideas Ability to demonstrate self-control will improve Ability to identify and develop effective coping behaviors will improve Ability to maintain clinical measurements within normal limits will improve Compliance with prescribed medications will improve Ability to identify triggers associated with substance abuse/mental health issues will improve  Medication Management: Evaluate patient's response, side effects, and tolerance of medication regimen.  Therapeutic Interventions: 1 to 1 sessions, Unit Group sessions and Medication administration.  Evaluation of Outcomes: Progressing  Physician Treatment Plan for Secondary Diagnosis: Principal Problem:   Schizoaffective disorder, bipolar type (HCC)  Long Term Goal(s): Improvement in symptoms so as ready for discharge   Short Term Goals: Ability to identify changes in lifestyle to reduce recurrence of condition will improve Ability to verbalize feelings will improve Ability to disclose and discuss suicidal ideas Ability to demonstrate self-control will improve Ability to identify  and develop effective coping behaviors will improve Ability to maintain clinical measurements within normal limits will improve Compliance with prescribed medications will improve Ability to identify triggers associated with substance abuse/mental health issues will improve      Medication Management: Evaluate patient's response, side effects, and tolerance of medication regimen.  Therapeutic Interventions: 1 to 1 sessions, Unit Group sessions and Medication administration.  Evaluation of Outcomes: Progressing   RN Treatment Plan for Primary Diagnosis: Schizoaffective disorder, bipolar type (HCC) Long Term Goal(s): Knowledge of disease and therapeutic regimen to maintain health will improve  Short Term Goals: Ability to remain free from injury will improve, Ability to verbalize frustration and anger appropriately will improve, Ability to demonstrate self-control, Ability to participate in decision making will improve, Ability to verbalize feelings will improve, Ability to disclose and discuss suicidal ideas, Ability to identify and develop effective coping behaviors will improve, and Compliance with prescribed medications will improve  Medication Management: RN will administer medications as ordered by provider, will assess and evaluate patient's response and provide education to patient for prescribed medication. RN will report any adverse and/or side effects to prescribing provider.  Therapeutic Interventions: 1 on 1 counseling sessions, Psychoeducation, Medication administration, Evaluate responses to treatment, Monitor vital signs and CBGs as ordered, Perform/monitor CIWA, COWS, AIMS and Fall Risk screenings as ordered, Perform wound care treatments as ordered.  Evaluation of Outcomes: Progressing   LCSW Treatment Plan for Primary Diagnosis: Schizoaffective disorder, bipolar type (HCC) Long Term Goal(s): Safe transition to appropriate next level of care at discharge, Engage patient in therapeutic group addressing interpersonal concerns.  Short Term Goals: Engage patient in aftercare planning with referrals and resources, Increase social support, Increase ability to appropriately verbalize feelings, Increase emotional regulation, Facilitate acceptance of mental  health diagnosis and concerns, and Increase skills for wellness and recovery  Therapeutic Interventions: Assess for all discharge needs, 1 to 1 time with Social worker, Explore available resources and support systems, Assess for adequacy in community support network, Educate family and significant other(s) on suicide prevention, Complete Psychosocial Assessment, Interpersonal group therapy.  Evaluation of Outcomes: Progressing   Progress in Treatment: Attending groups: Yes. Participating in groups: Yes. Taking medication as prescribed: Yes. Toleration medication: Yes. Family/Significant other contact made: Yes, individual(s) contacted:  Karren Burly, son  Patient understands diagnosis: Yes. Discussing patient identified problems/goals with staff: Yes. Medical problems stabilized or resolved: Yes. Denies suicidal/homicidal ideation: Yes. Issues/concerns per patient self-inventory: No. Other: None  New problem(s) identified: No, Describe:  none   New Short Term/Long Term Goal(s): Update 6/30: none at this time. Update 09/27/2022:  No changes at this time.  Update 10/02/2022:  No changes at this time. Update 10/07/22: No changes at this time 10/12/22: No changes at this time Update 10/18/22: No changes at this time Update 10/23/22: No changes at this time Update 10/28/22: No changes at this time Update 11/07/22: No changes at this time Update 11/12/22: No changes at this time  Update 11/17/22: None at this time. Update 11/22/22: None at this time. Update 11/27/22 No changes at this time    Patient Goals:  Update 6/30: none at this time. Update 09/27/2022:  No changes at this time. Update 10/02/2022:  No changes at this time. Update 10/07/22: No changes at this time 10/12/22: No changes at this time Update 10/18/22: No changes at this time Update 10/23/22: No changes at this time Update 10/28/22: No changes at this time Update 11/07/22: No changes at this time Update 11/12/22: No changes  at this time Update 11/17/22:  None at this time. Update 11/22/22: None at this time. Update 11/27/22 No changes at this time      Discharge Plan or Barriers: Update 6/30: APS report has been made and patient is being investigated for guardianship needs.  Remains homeless with limited supports. Update 09/27/2022:  No changes at this time.  Update 10/02/2022:  Patient remains safe on the unit at this time.  Patient remains psychotic at this time.  APS report has been made, however, no follow up from the caseworker on her case.  No safe discharge identified.  CSW has requested that application for Medicaid and disability be completed.   Update 10/07/22: No changes at this time 10/12/22: No changes at this time Update 10/18/22: No changes at this time Update 10/23/22: No changes at this time Update 10/23/22: No changes at this time Update 10/28/22: According to [facility], pt's caseworker at DSS, the petition was filed for guardianship on 10/21/22, now awaiting for the petition to be approvedUpdate 11/07/22: No changes at this time Update 11/12/22: CSW sent over patients information to [facility] and [facility], awaiting a response. CSW awaiting word from DSS regarding pt's petition and what agency will become her guardian. Update 11/17/22: No changes at this time. Update 11/22/22: CSW has sent patients information to multiple facilities that were suggested by leadership, pt has been denied from the facility or the facility does not respond. CSW continues to send pt's information to nursing homes. Update 11/27/22 CSW contacted DSS to inquire about the status of guardianship. No response at this time    Reason for Continuation of Hospitalization: Hallucinations Mania Medication stabilization   Estimated Length of Stay:  Update 6/30: 1-7 days Update 09/27/2022:  TBD Update 10/02/2022:  No changes at this time. Upda[place] 10/18/22: No changes at this time te 10/07/22: No changes at this time Update 10/18/22: No changes at this time Update 10/23/22: No changes at  this time Update 10/28/22: No changes at this time Update 11/07/22: No changes at this time Update 11/12/22: No changes at this time  Update 11/17/22: No changes at this time. Update 11/22/22: None at this time. Update 11/27/22 No changes at this time   Last 3 [place] Suicide Severity Risk Score: Flowsheet Row Admission (Current) from 08/22/2022 in [facility] ED from 08/17/2022 in [facility] Emergency Department at [facility] ED from 08/12/2022 in [facility] Emergency Department at [facility]  C-SSRS RISK CATEGORY Low Risk No Risk No Risk       Last [facility] 2/9 Scores:     No data to display          Scribe for Treatment Team: Laretta Alstrom 11/27/2022 12:33 PM

## 2022-11-27 NOTE — Group Note (Unsigned)
Date:  11/28/2022 Time:  12:09 AM  Group Topic/Focus:  Making Healthy Choices:   The focus of this group is to help patients identify negative/unhealthy choices they were using prior to admission and identify positive/healthier coping strategies to replace them upon discharge.    Participation Level:  Active  Participation Quality:  Appropriate  Affect:  Appropriate  Cognitive:  Appropriate  Insight: Appropriate  Engagement in Group:  Improving  Modes of Intervention:  Discussion  Additional Comments:    Betty Henderson 11/28/2022, 12:09 AM

## 2022-11-27 NOTE — Group Note (Signed)
Date:  11/27/2022 Time:  2:35 AM  Group Topic/Focus:  Goals Group:   The focus of this group is to help patients establish daily goals to achieve during treatment and discuss how the patient can incorporate goal setting into their daily lives to aide in recovery.    Participation Level:  Active  Participation Quality:  Appropriate  Affect:  Appropriate  Cognitive:  Appropriate  Insight: Appropriate  Engagement in Group:  Developing/Improving  Modes of Intervention:  Education  Additional Comments:    [name] 11/27/2022, 2:35 AM

## 2022-11-28 DIAGNOSIS — F250 Schizoaffective disorder, bipolar type: Secondary | ICD-10-CM | POA: Diagnosis not present

## 2022-11-28 NOTE — Progress Notes (Signed)
   11/28/22 2000  Psych Admission Type (Psych Patients Only)  Admission Status Voluntary  Psychosocial Assessment  Patient Complaints None  Eye Contact Brief  Facial Expression Flat  Affect Flat  Speech Soft;Slurred  Interaction Minimal  Motor Activity Shuffling  Appearance/Hygiene In scrubs  Behavior Characteristics Cooperative  Mood Pleasant  Thought Process  Coherency WDL  Content WDL  Delusions None reported or observed  Perception WDL  Hallucination None reported or observed  Judgment Limited  Confusion Mild  Danger to Self  Current suicidal ideation? Denies  Agreement Not to Harm Self Yes  Description of Agreement verbal  Danger to Others  Danger to Others None reported or observed

## 2022-11-28 NOTE — Group Note (Signed)
[facility] LCSW Group Therapy Note   Group Date: 11/28/2022 Start Time: 1315 End Time: 1400  Type of Therapy/Topic:  Group Therapy:  Feelings about Diagnosis  Participation Level:  Did Not Attend   Mood: X   Description of Group:    This group will allow patients to explore their thoughts and feelings about diagnoses they have received. Patients will be guided to explore their level of understanding and acceptance of these diagnoses. Facilitator will encourage patients to process their thoughts and feelings about the reactions of others to their diagnosis, and will guide patients in identifying ways to discuss their diagnosis with significant others in their lives. This group will be process-oriented, with patients participating in exploration of their own experiences as well as giving and receiving support and challenge from other group members.   Therapeutic Goals: 1. Patient will demonstrate understanding of diagnosis as evidence by identifying two or more symptoms of the disorder:  2. Patient will be able to express two feelings regarding the diagnosis 3. Patient will demonstrate ability to communicate their needs through discussion and/or role plays  Summary of Patient Progress:    X    Therapeutic Modalities:   Cognitive Behavioral Therapy Brief Therapy Feelings Identification    Elza Rafter, LCSWA

## 2022-11-28 NOTE — Group Note (Signed)
Recreation Therapy Group Note   Group Topic:Emotion Expression  Group Date: 11/28/2022 Start Time: 1400 End Time: 1440 Facilitators: Rosina Lowenstein, LRT, CTRS Location: Courtyard  Group Description: Music Reminisce. LRT encouraged patients to think of their favorite song(s) that reminded them of a positive memory or time in their life. LRT encouraged patient to talk about that memory aloud to the group. LRT played the song through a speaker for all to hear. LRT and patients discussed how thinking of a positive memory or time in their life can be used as a coping skill in everyday life post discharge.    Goal Area(s) Addressed: Patient will increase verbal communication by conversing with peers. Patient will contribute to group discussion with minimal prompting. Patient will reminisce a positive memory or moment in their life.    Affect/Mood: Appropriate and Flat   Participation Level: Active and Engaged   Participation Quality: Independent   Behavior: Appropriate, Calm, and Cooperative   Speech/Thought Process: Coherent   Insight: Good   Judgement: Good   Modes of Intervention: Guided Discussion and Music   Patient Response to Interventions:  Attentive, Engaged, Interested , and Receptive   Education Outcome:  Acknowledges education   Clinical Observations/Individualized Feedback: Betty Henderson was active in their participation of session activities and group discussion. Pt identified "love and happiness is my favorite song. It's all I can think of right now". Pt was singing along with the songs being played and requested another song by [facility]. Pt interacted well with LRT and peers duration of session.   Plan: Continue to engage patient in RT group sessions 2-3x/week.   Rosina Lowenstein, LRT, CTRS 11/28/2022 3:17 PM

## 2022-11-28 NOTE — Plan of Care (Signed)
 D: Pt alert and oriented. Pt denies experiencing any anxiety/depression at this time. Pt denies experiencing any pain at this time. Pt denies experiencing any SI/HI, or AVH at this time.   A: Scheduled medications administered to pt, per MD orders. Support and encouragement provided. Frequent verbal contact made. Routine safety checks conducted q15 minutes.   R: No adverse drug reactions noted. Pt verbally contracts for safety at this time. Pt compliant with medications and treatment plan. Pt interacts well with others on the unit. Pt remains safe at this time. Plan of care ongoing.  Problem: Nutrition: Goal: Adequate nutrition will be maintained Outcome: Progressing   Problem: Coping: Goal: Level of anxiety will decrease Outcome: Progressing

## 2022-11-28 NOTE — Plan of Care (Signed)
  Problem: Nutrition: Goal: Adequate nutrition will be maintained Outcome: Progressing   Problem: Coping: Goal: Level of anxiety will decrease Outcome: Progressing   Problem: Pain Managment: Goal: General experience of comfort will improve Outcome: Progressing   

## 2022-11-28 NOTE — Progress Notes (Signed)
[facility] MD Progress Note  Betty Henderson  MRN:  [id number]  Subjective: Case discussed with RN and social worker, chart reviewed, patient seen today during rounds.  No behavioral issues reported by the staff. Patient reports she is doing good.  Patient is awaiting placement.  Patient denies auditory or visual hallucinations.  Patient denies any thoughts of harming herself or others.  She reports she has been attending she reports that her sleep and appetite have improved.    Principal Problem: Schizoaffective disorder, bipolar type (HCC) Diagnosis: Principal Problem:   Schizoaffective disorder, bipolar type (HCC)   Past Psychiatric History: Schizoaffective disorder, bipolar type.  Past Medical History:  Past Medical History:  Diagnosis Date   Anemia    Arthritis    Chronic pain    Drug-seeking behavior    Malingering    Osteopetrosis    Psychosis (HCC)    Schizoaffective disorder, bipolar type (HCC)     Past Surgical History:  Procedure Laterality Date   MOUTH SURGERY     TUBAL LIGATION     Family History:  Family History  Family history unknown: Yes   Family Psychiatric  History: Unremarkable Social History:  Social History   Substance and Sexual Activity  Alcohol Use Not Currently   Comment: 1 cocktail 3 weeks ago     Social History   Substance and Sexual Activity  Drug Use Not Currently   Types: Cocaine   Comment: states "it's legal though"    Social History   Socioeconomic History   Marital status: Single    Spouse name: Not on file   Number of children: Not on file   Years of education: Not on file   Highest education level: Not on file  Occupational History   Not on file  Tobacco Use   Smoking status: Every Day    Current packs/day: 0.50    Types: Cigarettes   Smokeless tobacco: Never  Vaping Use   Vaping status: Never Used  Substance and Sexual Activity   Alcohol use: Not Currently    Comment: 1 cocktail 3 weeks ago   Drug use: Not  Currently    Types: Cocaine    Comment: states "it's legal though"   Sexual activity: Never  Other Topics Concern   Not on file  Social History Narrative   Not on file   Social Determinants of Health   Financial Resource Strain: Not on file  Food Insecurity: Food Insecurity Present (08/22/2022)   Hunger Vital Sign    Worried About Running Out of Food in the Last Year: Sometimes true    Ran Out of Food in the Last Year: Sometimes true  Transportation Needs: Patient Unable To Answer (08/22/2022)   PRAPARE - Transportation    Lack of Transportation (Medical): Patient unable to answer    Lack of Transportation (Non-Medical): Patient unable to answer  Recent Concern: Transportation Needs - Unmet Transportation Needs (06/13/2022)   Received from [facility], [facility]   [facility] - Transportation    Lack of Transportation (Medical): Not on file    Lack of Transportation (Non-Medical): Yes  Physical Activity: Not on file  Stress: Not on file  Social Connections: Unknown (08/06/2021)   Received from [facility], [facility]   Social Network    Social Network: Not on file   Additional Social History:                         Sleep: Good  Appetite:  Good  Current Medications: Current Facility-Administered Medications  Medication Dose Route Frequency Provider Last Rate Last Admin   acetaminophen (TYLENOL) tablet 650 mg  650 mg Oral Q6H PRN [name], DO   650 mg at 11/27/22 2122   alum & mag hydroxide-simeth (MAALOX/MYLANTA) 200-200-20 MG/5ML suspension 30 mL  30 mL Oral Q4H PRN [name], DO   30 mL at 09/20/22 1142   diphenhydrAMINE (BENADRYL) capsule 50 mg  50 mg Oral Q6H PRN [name], DO   50 mg at 09/29/22 2254   Or   diphenhydrAMINE (BENADRYL) injection 50 mg  50 mg Intramuscular Q6H PRN [name], DO       haloperidol (HALDOL) tablet 5 mg  5 mg Oral Q6H PRN [name], DO   5 mg at 09/28/22  2149   Or   haloperidol lactate (HALDOL) injection 5 mg  5 mg Intramuscular Q6H PRN [name], DO       ibuprofen (ADVIL) tablet 600 mg  600 mg Oral Q6H PRN [name], [name], MD   600 mg at 11/25/22 2131   LORazepam (ATIVAN) tablet 1 mg  1 mg Oral TID PRN [name], DO   1 mg at 09/27/22 0025   magnesium hydroxide (MILK OF MAGNESIA) suspension 30 mL  30 mL Oral Daily PRN [name], DO   30 mL at 11/08/22 1012   traZODone (DESYREL) tablet 50 mg  50 mg Oral QHS PRN [name], DO   50 mg at 11/27/22 2119    Lab Results: No results found for this or any previous visit (from the past 48 hour(s)).  Blood Alcohol level:  Lab Results  Component Value Date   ETH <10 08/17/2022   ETH <10 01/11/2021      Musculoskeletal: Strength & Muscle Tone: within normal limits Gait & Station: normal Patient leans: N/A   Psychiatric Specialty Exam:   Presentation  General Appearance:  Casual; Neat   Eye Contact: Fair   Speech: Spontaneous   Speech Volume: Normal   Handedness: Right     Mood and Affect  Mood: " Good"   Affect: Stable    Thought Processes: Improved,probably at baseline.    Descriptions of Associations: Intact   Orientation: Well-oriented   Thought Content:Denies SI/HI, at baseline   Hallucinations:Denies ,   Ideas of Reference:Paranoia, at baseline   Suicidal Thoughts:Denies SI  Homicidal Thoughts:Denies HI   Sensorium  Memory: Immediate Fair; Remote Poor   Judgment: Fair, pt is Rx compliant   Insight: Limited     Executive Functions  Concentration: Improved   Attention Span: Improved   Language: Fair     Psychomotor Activity  Psychomotor Activity: Normal  Assets  Assets: Communication Skills     Sleep  Sleep:Improved     Physical Exam: Physical Exam Vitals and nursing note reviewed.  Constitutional:      Appearance: Normal appearance. She is normal weight.   Neurological:     General: No focal deficit present.     Mental Status: She is alert.      Review of Systems  Constitutional: Negative.   HENT: Negative.    Eyes: Negative.   Respiratory: Negative.    Cardiovascular: Negative.   Gastrointestinal: Negative.   Genitourinary: Negative.   Musculoskeletal: Negative.   Skin: Negative.   Neurological: Negative.   Endo/Heme/Allergies: Negative.     Blood pressure 102/61, pulse 74, temperature (!) 97.3 F (36.3 C),  resp. rate 14, height 5\' 6"  (1.676 m), weight 64 kg, SpO2 97%. Body mass index is 22.76 kg/m.   Treatment Plan Summary: Daily contact with patient to assess and evaluate symptoms and progress in treatment, Medication management, and Plan continue current medications.  Continue to monitor patient on as needed meds Patient has received Invega Sustenna 156 mg IM on 11/13/2022, the previous dose was was given on 10/15/2022.   Patient received another dose of Invega Sustenna 156 mg IM on 11/18/2022 This was discussed with pharmacist today.  The incident was reported to safety zone, to prevent an incident like this in the future.  Patient was encouraged to drink fluids and keep herself hydrated QTc interval 431    [name], MD

## 2022-11-28 NOTE — Plan of Care (Signed)
  Problem: Education: Goal: Knowledge of General Education information will improve Description: Including pain rating scale, medication(s)/side effects and non-pharmacologic comfort measures Outcome: Progressing   Problem: Health Behavior/Discharge Planning: Goal: Ability to manage health-related needs will improve Outcome: Progressing   Problem: Clinical Measurements: Goal: Ability to maintain clinical measurements within normal limits will improve Outcome: Progressing Goal: Diagnostic test results will improve Outcome: Progressing   Problem: Nutrition: Goal: Adequate nutrition will be maintained Outcome: Progressing   Problem: Coping: Goal: Level of anxiety will decrease Outcome: Progressing   Problem: Elimination: Goal: Will not experience complications related to bowel motility Outcome: Progressing Goal: Will not experience complications related to urinary retention Outcome: Progressing   

## 2022-11-28 NOTE — Progress Notes (Signed)
   11/28/22 0020  Psych Admission Type (Psych Patients Only)  Admission Status Voluntary  Psychosocial Assessment  Patient Complaints None  Eye Contact Brief  Facial Expression Flat  Affect Flat  Speech Soft;Slurred  Interaction Minimal  Motor Activity Shuffling  Appearance/Hygiene In scrubs  Behavior Characteristics Cooperative  Mood Pleasant  Thought Process  Coherency WDL  Content WDL  Delusions None reported or observed  Perception WDL  Hallucination None reported or observed  Judgment Limited  Confusion Mild  Danger to Self  Current suicidal ideation? Denies  Agreement Not to Harm Self Yes  Description of Agreement verbal  Danger to Others  Danger to Others None reported or observed  Danger to Others Abnormal  Harmful Behavior to others No threats or harm toward other people  Destructive Behavior No threats or harm toward property

## 2022-11-28 NOTE — Group Note (Signed)
Date:  11/28/2022 Time:  6:32 PM  Group Topic/Focus:  Activity Group:  The focus of the group is to encourage patients to go outside and get some fresh air and some exercise as well.    Participation Level:  Active  Participation Quality:  Appropriate  Affect:  Appropriate  Cognitive:  Appropriate  Insight: Appropriate  Engagement in Group:  Engaged  Modes of Intervention:  Activity  Additional Comments:    Betty Henderson 11/28/2022, 6:32 PM

## 2022-11-29 DIAGNOSIS — F250 Schizoaffective disorder, bipolar type: Secondary | ICD-10-CM | POA: Diagnosis not present

## 2022-11-29 NOTE — Group Note (Signed)
Date:  11/29/2022 Time:  1:43 PM  Group Topic/Focus:  Making Healthy Choices:   The focus of this group is to help patients identify negative/unhealthy choices they were using prior to admission and identify positive/healthier coping strategies to replace them upon discharge.    Participation Level:  None  Participation Quality:    Affect:    Cognitive:    Insight:   Engagement in Group:    Modes of Intervention:    Additional Comments:    Tere Mcconaughey 11/29/2022, 1:43 PM

## 2022-11-29 NOTE — Group Note (Signed)
Recreation Therapy Group Note   Group Topic:Team Building  Group Date: 11/29/2022 Start Time: 1400 End Time: 1445 Facilitators: Rosina Lowenstein, LRT, CTRS Location: Courtyard  Group Description: Apples to Apples. LRT and patients played the card game "Apples to Apples" outside in the courtyard while getting fresh air and sunlight. Light music was being played in the background. LRT facilitated post-game discussion on the importance of working well with others, communicating effectively, active listening to others and being a part of a team. LRT and pts discussed how this can apply to life post-discharge.  Goal Area(s) Addressed: Patient will communicate with peers and LRT. Patient will build on frustration tolerance skills.  Patient will practice active listening skills.   Affect/Mood: Appropriate   Participation Level: Active and Engaged   Participation Quality: Independent   Behavior: Calm and Cooperative   Speech/Thought Process: Coherent   Insight: Good   Judgement: Good   Modes of Intervention: Competitive Play and Cooperative Play   Patient Response to Interventions:  Attentive, Engaged, Interested , and Receptive   Education Outcome:  Acknowledges education   Clinical Observations/Individualized Feedback: Betty Henderson was active in their participation of session activities and group discussion. Pt shared that she had never played the game before. Pt won the game overall and requested "a [employer]". LRT received permission for pt to receive one as her prize. Pt interacted well with LRT and peers duration of session.   Plan: Continue to engage patient in RT group sessions 2-3x/week.   Rosina Lowenstein, LRT, CTRS 11/29/2022 2:58 PM

## 2022-11-29 NOTE — Group Note (Signed)
Date:  11/29/2022 Time:  8:47 PM  Group Topic/Focus:  Making Healthy Choices:   The focus of this group is to help patients identify negative/unhealthy choices they were using prior to admission and identify positive/healthier coping strategies to replace them upon discharge.    Participation Level:  Active  Participation Quality:  Appropriate  Affect:  Appropriate  Cognitive:  Appropriate  Insight: Appropriate  Engagement in Group:  Engaged  Modes of Intervention:  Discussion  Additional Comments:    [name] 11/29/2022, 8:47 PM

## 2022-11-29 NOTE — Plan of Care (Signed)
D: Pt alert and oriented. Pt denies experiencing any anxiety/depression at this time. Pt denies experiencing any pain at this time. Pt denies experiencing any SI/HI, or AVH at this time.   A: Support and encouragement provided. Frequent verbal contact made. Routine safety checks conducted q15 minutes.   R: Pt verbally contracts for safety at this time. Pt compliant with treatment plan. Pt interacts well with others on the unit. Pt remains safe at this time. Plan of care ongoing.  Pt attended groups  Problem: Coping: Goal: Level of anxiety will decrease Outcome: Progressing   Problem: Pain Managment: Goal: General experience of comfort will improve Outcome: Progressing

## 2022-11-29 NOTE — Progress Notes (Signed)
[facility] MD Progress Note  Betty Henderson  MRN:  [id number]  Subjective: Case discussed with RN and social worker, chart reviewed, patient seen today during rounds. SW is working with DSS on getting patient SSI.  No behavioral issues reported by the staff. Patient reports she is doing good. Patient denies auditory or visual hallucinations.  Patient denies any thoughts of harming herself or others.  She reports she has been attending groups. she reports that her sleep and appetite have improved.    Principal Problem: Schizoaffective disorder, bipolar type (HCC) Diagnosis: Principal Problem:   Schizoaffective disorder, bipolar type (HCC)   Past Psychiatric History: Schizoaffective disorder, bipolar type.  Past Medical History:  Past Medical History:  Diagnosis Date   Anemia    Arthritis    Chronic pain    Drug-seeking behavior    Malingering    Osteopetrosis    Psychosis (HCC)    Schizoaffective disorder, bipolar type (HCC)     Past Surgical History:  Procedure Laterality Date   MOUTH SURGERY     TUBAL LIGATION     Family History:  Family History  Family history unknown: Yes   Family Psychiatric  History: Unremarkable Social History:  Social History   Substance and Sexual Activity  Alcohol Use Not Currently   Comment: 1 cocktail 3 weeks ago     Social History   Substance and Sexual Activity  Drug Use Not Currently   Types: Cocaine   Comment: states "it's legal though"    Social History   Socioeconomic History   Marital status: Single    Spouse name: Not on file   Number of children: Not on file   Years of education: Not on file   Highest education level: Not on file  Occupational History   Not on file  Tobacco Use   Smoking status: Every Day    Current packs/day: 0.50    Types: Cigarettes   Smokeless tobacco: Never  Vaping Use   Vaping status: Never Used  Substance and Sexual Activity   Alcohol use: Not Currently    Comment: 1 cocktail 3 weeks ago    Drug use: Not Currently    Types: Cocaine    Comment: states "it's legal though"   Sexual activity: Never  Other Topics Concern   Not on file  Social History Narrative   Not on file   Social Determinants of Health   Financial Resource Strain: Not on file  Food Insecurity: Food Insecurity Present (08/22/2022)   Hunger Vital Sign    Worried About Running Out of Food in the Last Year: Sometimes true    Ran Out of Food in the Last Year: Sometimes true  Transportation Needs: Patient Unable To Answer (08/22/2022)   PRAPARE - Transportation    Lack of Transportation (Medical): Patient unable to answer    Lack of Transportation (Non-Medical): Patient unable to answer  Recent Concern: Transportation Needs - Unmet Transportation Needs (06/13/2022)   Received from [facility], [facility]   [facility] - Transportation    Lack of Transportation (Medical): Not on file    Lack of Transportation (Non-Medical): Yes  Physical Activity: Not on file  Stress: Not on file  Social Connections: Unknown (08/06/2021)   Received from [facility], [facility]   Social Network    Social Network: Not on file   Additional Social History:  Sleep: Good  Appetite:  Good  Current Medications: Current Facility-Administered Medications  Medication Dose Route Frequency Provider Last Rate Last Admin   acetaminophen (TYLENOL) tablet 650 mg  650 mg Oral Q6H PRN [name], DO   650 mg at 11/27/22 2122   alum & mag hydroxide-simeth (MAALOX/MYLANTA) 200-200-20 MG/5ML suspension 30 mL  30 mL Oral Q4H PRN [name], DO   30 mL at 09/20/22 1142   diphenhydrAMINE (BENADRYL) capsule 50 mg  50 mg Oral Q6H PRN [name], DO   50 mg at 09/29/22 2254   Or   diphenhydrAMINE (BENADRYL) injection 50 mg  50 mg Intramuscular Q6H PRN [name], DO       haloperidol (HALDOL) tablet 5 mg  5 mg Oral Q6H PRN [name], DO   5 mg  at 09/28/22 2149   Or   haloperidol lactate (HALDOL) injection 5 mg  5 mg Intramuscular Q6H PRN [name], DO       ibuprofen (ADVIL) tablet 600 mg  600 mg Oral Q6H PRN [name], [name], MD   600 mg at 11/25/22 2131   LORazepam (ATIVAN) tablet 1 mg  1 mg Oral TID PRN [name], DO   1 mg at 09/27/22 0025   magnesium hydroxide (MILK OF MAGNESIA) suspension 30 mL  30 mL Oral Daily PRN [name], DO   30 mL at 11/08/22 1012   traZODone (DESYREL) tablet 50 mg  50 mg Oral QHS PRN [name], DO   50 mg at 11/28/22 2120    Lab Results: No results found for this or any previous visit (from the past 48 hour(s)).  Blood Alcohol level:  Lab Results  Component Value Date   ETH <10 08/17/2022   ETH <10 01/11/2021      Musculoskeletal: Strength & Muscle Tone: within normal limits Gait & Station: normal Patient leans: N/A   Psychiatric Specialty Exam:   Presentation  General Appearance:  Casual; Neat   Eye Contact: Fair   Speech: Spontaneous   Speech Volume: Normal   Handedness: Right     Mood and Affect  Mood: " Good"   Affect: Stable    Thought Processes: Improved,probably at baseline.    Descriptions of Associations: Intact   Orientation: Well-oriented   Thought Content:Denies SI/HI, at baseline   Hallucinations:Denies ,   Ideas of Reference:Paranoia, at baseline   Suicidal Thoughts:Denies SI  Homicidal Thoughts:Denies HI   Sensorium  Memory: Immediate Fair; Remote Poor   Judgment: Fair, pt is Rx compliant   Insight: Limited     Executive Functions  Concentration: Improved   Attention Span: Improved   Language: Fair     Psychomotor Activity  Psychomotor Activity: Normal  Assets  Assets: Communication Skills     Sleep  Sleep:Improved     Physical Exam: Physical Exam Vitals and nursing note reviewed.  Constitutional:      Appearance: Normal appearance. She is normal  weight.  Neurological:     General: No focal deficit present.     Mental Status: She is alert.      Review of Systems  Constitutional: Negative.   HENT: Negative.    Eyes: Negative.   Respiratory: Negative.    Cardiovascular: Negative.   Gastrointestinal: Negative.   Genitourinary: Negative.   Musculoskeletal: Negative.   Skin: Negative.   Neurological: Negative.   Endo/Heme/Allergies: Negative.     Blood pressure 107/63, pulse 78, temperature (!) 97.3  F (36.3 C), resp. rate 16, height 5\' 6"  (1.676 m), weight 64 kg, SpO2 100%. Body mass index is 22.76 kg/m.   Treatment Plan Summary: Daily contact with patient to assess and evaluate symptoms and progress in treatment, Medication management, and Plan continue current medications.  Continue to monitor patient on as needed meds Patient has received Invega Sustenna 156 mg IM on 11/13/2022, the previous dose was was given on 10/15/2022.   Patient received another dose of Invega Sustenna 156 mg IM on 11/18/2022 This was discussed with pharmacist.  The incident was reported to safety zone, to prevent an incident like this in the future.  Patient was encouraged to drink fluids and keep herself hydrated QTc interval 431    [name], MD

## 2022-11-30 DIAGNOSIS — F250 Schizoaffective disorder, bipolar type: Secondary | ICD-10-CM | POA: Diagnosis not present

## 2022-11-30 NOTE — Plan of Care (Signed)
D: Pt alert and oriented. Pt denies experiencing any anxiety/depression at this time. Pt denies experiencing any pain at this time. Pt denies experiencing any SI/HI, or AVH at this time.   A: Scheduled medications administered to pt, per MD orders. Support and encouragement provided. Frequent verbal contact made. Routine safety checks conducted q15 minutes.   R: No adverse drug reactions noted. Pt verbally contracts for safety at this time. Pt compliant with medications and treatment plan. Pt interacts well with others on the unit. Pt remains safe at this time. Plan of care ongoing.  Problem: Coping: Goal: Level of anxiety will decrease 11/30/2022 2046 by Sharin Mons, RN Outcome: Progressing 11/30/2022 1043 by Sharin Mons, RN Outcome: Progressing   Problem: Pain Managment: Goal: General experience of comfort will improve 11/30/2022 2046 by Sharin Mons, RN Outcome: Progressing 11/30/2022 1043 by Sharin Mons, RN Outcome: Progressing

## 2022-11-30 NOTE — Plan of Care (Signed)
D: Pt alert and oriented. Pt denies experiencing any anxiety/depression. Pt denies experiencing any pain at this time. Pt denies experiencing any SI/HI, or AVH at this time.   A: Support and encouragement provided. Frequent verbal contact made. Routine safety checks conducted q15 minutes.   R: Pt verbally contracts for safety at this time. Pt compliant with medications and treatment plan. Pt interacts well with others on the unit. Pt remains safe at this time. Plan of care ongoing.  Problem: Coping: Goal: Level of anxiety will decrease Outcome: Progressing   Problem: Pain Managment: Goal: General experience of comfort will improve Outcome: Progressing

## 2022-11-30 NOTE — Progress Notes (Signed)
Patient remained in the bed resting quietly most of the shift. She denies SI, & AVH. No new behavioral issues to report on shift at this time.

## 2022-11-30 NOTE — Group Note (Signed)
Date:  11/30/2022 Time:  8:24 PM  Group Topic/Focus:  Healthy Communication:   The focus of this group is to discuss communication, barriers to communication, as well as healthy ways to communicate with others.    Participation Level:  Active  Participation Quality:  Appropriate  Affect:  Appropriate  Cognitive:  Appropriate  Insight: Appropriate  Engagement in Group:  Engaged  Modes of Intervention:  Discussion  Additional Comments:    [name] 11/30/2022, 8:24 PM

## 2022-11-30 NOTE — Progress Notes (Signed)
[facility] MD Progress Note  Betty Henderson  MRN:  [id number]  Subjective: Case discussed with RN and social worker, chart reviewed, patient seen today during rounds. No behavioral issues reported by the staff. No new acute events overnight. SW is working with DSS on getting patient SSI.  Patient reports she is doing good. Patient denies any thoughts of harming herself or others.  Patient denies auditory or visual hallucinations. .She reports she has been attending groups. she reports that her sleep and appetite have improved.    Principal Problem: Schizoaffective disorder, bipolar type (HCC) Diagnosis: Principal Problem:   Schizoaffective disorder, bipolar type (HCC)   Past Psychiatric History: Schizoaffective disorder, bipolar type.  Past Medical History:  Past Medical History:  Diagnosis Date   Anemia    Arthritis    Chronic pain    Drug-seeking behavior    Malingering    Osteopetrosis    Psychosis (HCC)    Schizoaffective disorder, bipolar type (HCC)     Past Surgical History:  Procedure Laterality Date   MOUTH SURGERY     TUBAL LIGATION     Family History:  Family History  Family history unknown: Yes   Family Psychiatric  History: Unremarkable Social History:  Social History   Substance and Sexual Activity  Alcohol Use Not Currently   Comment: 1 cocktail 3 weeks ago     Social History   Substance and Sexual Activity  Drug Use Not Currently   Types: Cocaine   Comment: states "it's legal though"    Social History   Socioeconomic History   Marital status: Single    Spouse name: Not on file   Number of children: Not on file   Years of education: Not on file   Highest education level: Not on file  Occupational History   Not on file  Tobacco Use   Smoking status: Every Day    Current packs/day: 0.50    Types: Cigarettes   Smokeless tobacco: Never  Vaping Use   Vaping status: Never Used  Substance and Sexual Activity   Alcohol use: Not Currently     Comment: 1 cocktail 3 weeks ago   Drug use: Not Currently    Types: Cocaine    Comment: states "it's legal though"   Sexual activity: Never  Other Topics Concern   Not on file  Social History Narrative   Not on file   Social Determinants of Health   Financial Resource Strain: Not on file  Food Insecurity: Food Insecurity Present (08/22/2022)   Hunger Vital Sign    Worried About Running Out of Food in the Last Year: Sometimes true    Ran Out of Food in the Last Year: Sometimes true  Transportation Needs: Patient Unable To Answer (08/22/2022)   PRAPARE - Transportation    Lack of Transportation (Medical): Patient unable to answer    Lack of Transportation (Non-Medical): Patient unable to answer  Recent Concern: Transportation Needs - Unmet Transportation Needs (06/13/2022)   Received from [facility], [facility]   [facility] - Transportation    Lack of Transportation (Medical): Not on file    Lack of Transportation (Non-Medical): Yes  Physical Activity: Not on file  Stress: Not on file  Social Connections: Unknown (08/06/2021)   Received from [facility], [facility]   Social Network    Social Network: Not on file   Additional Social History:  Sleep: Good  Appetite:  Good  Current Medications: Current Facility-Administered Medications  Medication Dose Route Frequency Provider Last Rate Last Admin   acetaminophen (TYLENOL) tablet 650 mg  650 mg Oral Q6H PRN [name], DO   650 mg at 11/27/22 2122   alum & mag hydroxide-simeth (MAALOX/MYLANTA) 200-200-20 MG/5ML suspension 30 mL  30 mL Oral Q4H PRN [name], DO   30 mL at 09/20/22 1142   diphenhydrAMINE (BENADRYL) capsule 50 mg  50 mg Oral Q6H PRN [name], DO   50 mg at 09/29/22 2254   Or   diphenhydrAMINE (BENADRYL) injection 50 mg  50 mg Intramuscular Q6H PRN [name], DO       haloperidol (HALDOL) tablet 5 mg  5 mg Oral Q6H PRN  [name], DO   5 mg at 09/28/22 2149   Or   haloperidol lactate (HALDOL) injection 5 mg  5 mg Intramuscular Q6H PRN [name], DO       ibuprofen (ADVIL) tablet 600 mg  600 mg Oral Q6H PRN [name], [name], MD   600 mg at 11/25/22 2131   LORazepam (ATIVAN) tablet 1 mg  1 mg Oral TID PRN [name], DO   1 mg at 09/27/22 0025   magnesium hydroxide (MILK OF MAGNESIA) suspension 30 mL  30 mL Oral Daily PRN [name], DO   30 mL at 11/08/22 1012   traZODone (DESYREL) tablet 50 mg  50 mg Oral QHS PRN [name], DO   50 mg at 11/29/22 2122    Lab Results: No results found for this or any previous visit (from the past 48 hour(s)).  Blood Alcohol level:  Lab Results  Component Value Date   ETH <10 08/17/2022   ETH <10 01/11/2021      Musculoskeletal: Strength & Muscle Tone: within normal limits Gait & Station: normal Patient leans: N/A   Psychiatric Specialty Exam:   Presentation  General Appearance:  Casual; Neat   Eye Contact: Fair   Speech: Spontaneous   Speech Volume: Normal   Handedness: Right     Mood and Affect  Mood: " Good"   Affect: Stable    Thought Processes: Improved,probably at baseline.    Descriptions of Associations: Intact   Orientation: Well-oriented   Thought Content:Denies SI/HI, at baseline   Hallucinations:Denies ,   Ideas of Reference:None noted   Suicidal Thoughts:Denies SI  Homicidal Thoughts:Denies HI   Sensorium  Memory: Immediate Fair; Remote Poor   Judgment: Fair, pt is Rx compliant   Insight: Limited     Executive Functions  Concentration: Improved   Attention Span: Improved   Language: Fair     Psychomotor Activity  Psychomotor Activity: Normal  Assets  Assets: Communication Skills     Sleep  Sleep:Improved     Physical Exam: Physical Exam Vitals and nursing note reviewed.  Constitutional:      Appearance: Normal  appearance. She is normal weight.  Neurological:     General: No focal deficit present.     Mental Status: She is alert.      Review of Systems  Constitutional: Negative.   HENT: Negative.    Eyes: Negative.   Respiratory: Negative.    Cardiovascular: Negative.   Gastrointestinal: Negative.   Genitourinary: Negative.   Musculoskeletal: Negative.   Skin: Negative.   Neurological: Negative.   Endo/Heme/Allergies: Negative.     Blood pressure 123/68, pulse 70, temperature 97.8 F (36.6  C), resp. rate 18, height 5\' 6"  (1.676 m), weight 64 kg, SpO2 98%. Body mass index is 22.76 kg/m.   Treatment Plan Summary: Daily contact with patient to assess and evaluate symptoms and progress in treatment, Medication management, and Plan continue current medications.  Continue to monitor patient on as needed meds Patient has received Invega Sustenna 156 mg IM on 11/13/2022, the previous dose was was given on 10/15/2022.   Patient received another dose of Invega Sustenna 156 mg IM on 11/18/2022 This was discussed with pharmacist.  The incident was reported to safety zone, to prevent an incident like this in the future.  Patient was encouraged to drink fluids and keep herself hydrated QTc interval 431, 11/27/22    [name], MD

## 2022-11-30 NOTE — Group Note (Signed)
Date:  11/30/2022 Time:  10:52 AM  Group Topic/Focus:  Emotional Education:   The focus of this group is to discuss what feelings/emotions are, and how they are experienced. Spirituality:   The focus of this group is to discuss how one's spirituality can aide in recovery.    Participation Level:  Active  Participation Quality:  Appropriate, Attentive, Sharing, and Supportive  Affect:  Appropriate  Cognitive:  Alert, Appropriate, and Oriented  Insight: Appropriate and Good  Engagement in Group:  Engaged  Modes of Intervention:  Activity, Discussion, and Socialization  Additional Comments:     Alexis Frock 11/30/2022, 10:52 AM

## 2022-11-30 NOTE — Plan of Care (Signed)
  Problem: Education: Goal: Knowledge of General Education information will improve Description: Including pain rating scale, medication(s)/side effects and non-pharmacologic comfort measures Outcome: Progressing   Problem: Health Behavior/Discharge Planning: Goal: Ability to manage health-related needs will improve Outcome: Progressing   Problem: Clinical Measurements: Goal: Ability to maintain clinical measurements within normal limits will improve Outcome: Progressing Goal: Diagnostic test results will improve Outcome: Progressing   Problem: Nutrition: Goal: Adequate nutrition will be maintained Outcome: Progressing   Problem: Coping: Goal: Level of anxiety will decrease Outcome: Progressing   Problem: Elimination: Goal: Will not experience complications related to bowel motility Outcome: Progressing Goal: Will not experience complications related to urinary retention Outcome: Progressing   Problem: Pain Managment: Goal: General experience of comfort will improve Outcome: Progressing   Problem: Safety: Goal: Ability to remain free from injury will improve Outcome: Progressing   Problem: Skin Integrity: Goal: Risk for impaired skin integrity will decrease Outcome: Progressing

## 2022-12-01 DIAGNOSIS — F250 Schizoaffective disorder, bipolar type: Secondary | ICD-10-CM | POA: Diagnosis not present

## 2022-12-01 NOTE — Group Note (Signed)
Date:  12/01/2022 Time:  10:21 PM  Group Topic/Focus:  Making Healthy Choices:   The focus of this group is to help patients identify negative/unhealthy choices they were using prior to admission and identify positive/healthier coping strategies to replace them upon discharge.    Participation Level:  Active  Participation Quality:  Appropriate  Affect:  Appropriate  Cognitive:  Appropriate  Insight: Improving  Engagement in Group:  Engaged  Modes of Intervention:  Discussion  Additional Comments:    Betty Henderson 12/01/2022, 10:21 PM

## 2022-12-01 NOTE — [facility] Group Notes (Addendum)
Patients were given recreation activity of listening to music. Pt attended and was appropriate.

## 2022-12-01 NOTE — Progress Notes (Signed)
[facility] MD Progress Note  Betty Henderson  MRN:  [id number]  Subjective: Case discussed with RN and social worker, chart reviewed, patient seen today during rounds. No behavioral issues reported by the staff. No new acute events overnight. SW is working with DSS on getting patient SSI.  Patient reports she is doing good. She reports she has been attending groups. She reports that her sleep and appetite have improved.  Patient denies any thoughts of harming herself or others.  Patient denies auditory or visual hallucinations. .  Principal Problem: Schizoaffective disorder, bipolar type (HCC) Diagnosis: Principal Problem:   Schizoaffective disorder, bipolar type (HCC)   Past Psychiatric History: Schizoaffective disorder, bipolar type.  Past Medical History:  Past Medical History:  Diagnosis Date   Anemia    Arthritis    Chronic pain    Drug-seeking behavior    Malingering    Osteopetrosis    Psychosis (HCC)    Schizoaffective disorder, bipolar type (HCC)     Past Surgical History:  Procedure Laterality Date   MOUTH SURGERY     TUBAL LIGATION     Family History:  Family History  Family history unknown: Yes   Family Psychiatric  History: Unremarkable Social History:  Social History   Substance and Sexual Activity  Alcohol Use Not Currently   Comment: 1 cocktail 3 weeks ago     Social History   Substance and Sexual Activity  Drug Use Not Currently   Types: Cocaine   Comment: states "it's legal though"    Social History   Socioeconomic History   Marital status: Single    Spouse name: Not on file   Number of children: Not on file   Years of education: Not on file   Highest education level: Not on file  Occupational History   Not on file  Tobacco Use   Smoking status: Every Day    Current packs/day: 0.50    Types: Cigarettes   Smokeless tobacco: Never  Vaping Use   Vaping status: Never Used  Substance and Sexual Activity   Alcohol use: Not Currently     Comment: 1 cocktail 3 weeks ago   Drug use: Not Currently    Types: Cocaine    Comment: states "it's legal though"   Sexual activity: Never  Other Topics Concern   Not on file  Social History Narrative   Not on file   Social Determinants of Health   Financial Resource Strain: Not on file  Food Insecurity: Food Insecurity Present (08/22/2022)   Hunger Vital Sign    Worried About Running Out of Food in the Last Year: Sometimes true    Ran Out of Food in the Last Year: Sometimes true  Transportation Needs: Patient Unable To Answer (08/22/2022)   PRAPARE - Transportation    Lack of Transportation (Medical): Patient unable to answer    Lack of Transportation (Non-Medical): Patient unable to answer  Recent Concern: Transportation Needs - Unmet Transportation Needs (06/13/2022)   Received from [facility], [facility]   [facility] - Transportation    Lack of Transportation (Medical): Not on file    Lack of Transportation (Non-Medical): Yes  Physical Activity: Not on file  Stress: Not on file  Social Connections: Unknown (08/06/2021)   Received from [facility], [facility]   Social Network    Social Network: Not on file   Additional Social History:  Sleep: Good  Appetite:  Good  Current Medications: Current Facility-Administered Medications  Medication Dose Route Frequency Provider Last Rate Last Admin   acetaminophen (TYLENOL) tablet 650 mg  650 mg Oral Q6H PRN [name], DO   650 mg at 11/27/22 2122   alum & mag hydroxide-simeth (MAALOX/MYLANTA) 200-200-20 MG/5ML suspension 30 mL  30 mL Oral Q4H PRN [name], DO   30 mL at 09/20/22 1142   diphenhydrAMINE (BENADRYL) capsule 50 mg  50 mg Oral Q6H PRN [name], DO   50 mg at 09/29/22 2254   Or   diphenhydrAMINE (BENADRYL) injection 50 mg  50 mg Intramuscular Q6H PRN [name], DO       haloperidol (HALDOL) tablet 5 mg  5 mg Oral Q6H PRN  [name], DO   5 mg at 09/28/22 2149   Or   haloperidol lactate (HALDOL) injection 5 mg  5 mg Intramuscular Q6H PRN [name], DO       ibuprofen (ADVIL) tablet 600 mg  600 mg Oral Q6H PRN [name], [name], MD   600 mg at 11/30/22 2158   LORazepam (ATIVAN) tablet 1 mg  1 mg Oral TID PRN [name], DO   1 mg at 09/27/22 0025   magnesium hydroxide (MILK OF MAGNESIA) suspension 30 mL  30 mL Oral Daily PRN [name], DO   30 mL at 11/08/22 1012   traZODone (DESYREL) tablet 50 mg  50 mg Oral QHS PRN [name], DO   50 mg at 11/30/22 2134    Lab Results: No results found for this or any previous visit (from the past 48 hour(s)).  Blood Alcohol level:  Lab Results  Component Value Date   ETH <10 08/17/2022   ETH <10 01/11/2021      Musculoskeletal: Strength & Muscle Tone: within normal limits Gait & Station: normal Patient leans: N/A   Psychiatric Specialty Exam:   Presentation  General Appearance:  Casual; Neat   Eye Contact: Fair   Speech: Spontaneous   Speech Volume: Normal   Handedness: Right     Mood and Affect  Mood: " Fine"   Affect: Stable    Thought Processes: Improved,probably at baseline.    Descriptions of Associations: Intact   Orientation: Well-oriented   Thought Content:Denies SI/HI, at baseline   Hallucinations:Denies ,   Ideas of Reference:None noted   Suicidal Thoughts:Denies SI  Homicidal Thoughts:Denies HI   Sensorium  Memory: Immediate Fair; Remote Poor   Judgment: Fair, pt is Rx compliant   Insight: Limited     Executive Functions  Concentration: Improved   Attention Span: Improved   Language: Fair     Psychomotor Activity  Psychomotor Activity: Normal  Assets  Assets: Communication Skills     Sleep  Sleep:Improved     Physical Exam: Physical Exam Vitals and nursing note reviewed.  Constitutional:      Appearance: Normal  appearance. She is normal weight.  Neurological:     General: No focal deficit present.     Mental Status: She is alert.      Review of Systems  Constitutional: Negative.   HENT: Negative.    Eyes: Negative.   Respiratory: Negative.    Cardiovascular: Negative.   Gastrointestinal: Negative.   Genitourinary: Negative.   Musculoskeletal: Negative.   Skin: Negative.   Neurological: Negative.   Endo/Heme/Allergies: Negative.     Blood pressure 109/60, pulse 65, temperature (!) 97.4 F (  36.3 C), resp. rate 17, height 5\' 6"  (1.676 m), weight 64 kg, SpO2 96%. Body mass index is 22.76 kg/m.   Treatment Plan Summary: Daily contact with patient to assess and evaluate symptoms and progress in treatment, Medication management, and Plan continue current medications.  Continue to monitor patient on as needed meds Patient has received Invega Sustenna 156 mg IM on 11/13/2022, the previous dose was was given on 10/15/2022.   Patient received another dose of Invega Sustenna 156 mg IM on 11/18/2022 This was discussed with pharmacist.  The incident was reported to safety zone, to prevent an incident like this in the future.  Patient was encouraged to drink fluids and keep herself hydrated QTc interval 431, 11/27/22    [name], MD

## 2022-12-01 NOTE — Progress Notes (Addendum)
D: Pt alert and oriented. Pt denies experiencing any anxiety/depression at this time. Pt is  experiencing pain in the right shoulder which she rates at 8/10 at this time. Pt denies experiencing any SI/HI at this time.  A: Scheduled  medications administered to pt, per MD orders. Support and encouragement provided. Frequent verbal contact made. Routine safety checks conducted q15 minutes.   R: No adverse drug reactions noted. Pt verbally contracts for safety at this time. Pt compliant with medications and treatment plan.  Pt interacts well with others on the unit. Pt remains safe at this time. Plan of care ongoing.

## 2022-12-01 NOTE — Group Note (Signed)
LCSW Group Therapy Note   Group Date: 12/01/2022 Start Time: 1320 End Time: 1400   Type of Therapy and Topic:  Group Therapy: 8 Dimensions of Wellness Examples   Participation Level:  Minimal     Summary of Patient Progress:  The patient attended group. Patient proved open to input from peers and feedback from [facility]. The patient was respectful of peers. The patient participated during today's icebreaker questions. The patient stated that she practices self-care by exercising.     Marshell Levan, LCSWA 12/01/2022  2:59 PM

## 2022-12-01 NOTE — [facility] Group Notes (Signed)
[facility] Group Notes:  (Nursing/MHT/Case Management/Adjunct)  Date:  12/01/2022  Time: 1000  Type of Therapy:  Psychoeducational Skills  Participation Level:  Did Not Attend  Participation Quality:  na  Affect:  na  Cognitive:  na  Insight:  None  Engagement in Group:  na  Modes of Intervention:  na  Summary of Progress/Problems: Pt did not attend AM Psychoeducational group.   Malva Limes 12/01/2022, 12:06 PM

## 2022-12-01 NOTE — Progress Notes (Signed)
Patient is a voluntary admission to [facility] for SAD/ Bipolar. Patient has had no behavioral issue and interacts well with both peers and staff. Ambulates well on her own and performs her own ADL's. Occasionally incontinent of urine - wears a depends. Eats all her meals.  Will continue to monitor.

## 2022-12-02 DIAGNOSIS — F250 Schizoaffective disorder, bipolar type: Secondary | ICD-10-CM | POA: Diagnosis not present

## 2022-12-02 NOTE — Progress Notes (Signed)
[facility] MD Progress Note  Betty Henderson  MRN:  [id number]  Subjective: Case discussed with RN and social worker, chart reviewed, patient seen today during rounds. No behavioral issues reported by the staff. SW is working with DSS on getting patient SSI.  Patient reports she is doing good. She was pleasant during the assessment today. She reports she has been attending groups. She reports that her sleep and appetite have improved.  Patient denies any thoughts of harming herself or others.  Patient denies auditory or visual hallucinations. .  Principal Problem: Schizoaffective disorder, bipolar type (HCC) Diagnosis: Principal Problem:   Schizoaffective disorder, bipolar type (HCC)   Past Psychiatric History: Schizoaffective disorder, bipolar type.  Past Medical History:  Past Medical History:  Diagnosis Date   Anemia    Arthritis    Chronic pain    Drug-seeking behavior    Malingering    Osteopetrosis    Psychosis (HCC)    Schizoaffective disorder, bipolar type (HCC)     Past Surgical History:  Procedure Laterality Date   MOUTH SURGERY     TUBAL LIGATION     Family History:  Family History  Family history unknown: Yes   Family Psychiatric  History: Unremarkable Social History:  Social History   Substance and Sexual Activity  Alcohol Use Not Currently   Comment: 1 cocktail 3 weeks ago     Social History   Substance and Sexual Activity  Drug Use Not Currently   Types: Cocaine   Comment: states "it's legal though"    Social History   Socioeconomic History   Marital status: Single    Spouse name: Not on file   Number of children: Not on file   Years of education: Not on file   Highest education level: Not on file  Occupational History   Not on file  Tobacco Use   Smoking status: Every Day    Current packs/day: 0.50    Types: Cigarettes   Smokeless tobacco: Never  Vaping Use   Vaping status: Never Used  Substance and Sexual Activity   Alcohol use: Not  Currently    Comment: 1 cocktail 3 weeks ago   Drug use: Not Currently    Types: Cocaine    Comment: states "it's legal though"   Sexual activity: Never  Other Topics Concern   Not on file  Social History Narrative   Not on file   Social Determinants of Health   Financial Resource Strain: Not on file  Food Insecurity: Food Insecurity Present (08/22/2022)   Hunger Vital Sign    Worried About Running Out of Food in the Last Year: Sometimes true    Ran Out of Food in the Last Year: Sometimes true  Transportation Needs: Patient Unable To Answer (08/22/2022)   PRAPARE - Transportation    Lack of Transportation (Medical): Patient unable to answer    Lack of Transportation (Non-Medical): Patient unable to answer  Recent Concern: Transportation Needs - Unmet Transportation Needs (06/13/2022)   Received from [facility], [facility]   [facility] - Transportation    Lack of Transportation (Medical): Not on file    Lack of Transportation (Non-Medical): Yes  Physical Activity: Not on file  Stress: Not on file  Social Connections: Unknown (08/06/2021)   Received from [facility], [facility]   Social Network    Social Network: Not on file   Additional Social History:  Sleep: Good  Appetite:  Good  Current Medications: Current Facility-Administered Medications  Medication Dose Route Frequency Provider Last Rate Last Admin   acetaminophen (TYLENOL) tablet 650 mg  650 mg Oral Q6H PRN [name], DO   650 mg at 11/27/22 2122   alum & mag hydroxide-simeth (MAALOX/MYLANTA) 200-200-20 MG/5ML suspension 30 mL  30 mL Oral Q4H PRN [name], DO   30 mL at 09/20/22 1142   diphenhydrAMINE (BENADRYL) capsule 50 mg  50 mg Oral Q6H PRN [name], DO   50 mg at 09/29/22 2254   Or   diphenhydrAMINE (BENADRYL) injection 50 mg  50 mg Intramuscular Q6H PRN [name], DO       haloperidol (HALDOL) tablet 5 mg  5  mg Oral Q6H PRN [name], DO   5 mg at 09/28/22 2149   Or   haloperidol lactate (HALDOL) injection 5 mg  5 mg Intramuscular Q6H PRN [name], DO       ibuprofen (ADVIL) tablet 600 mg  600 mg Oral Q6H PRN [name], [name], MD   600 mg at 12/01/22 2100   LORazepam (ATIVAN) tablet 1 mg  1 mg Oral TID PRN [name], DO   1 mg at 09/27/22 0025   magnesium hydroxide (MILK OF MAGNESIA) suspension 30 mL  30 mL Oral Daily PRN [name], DO   30 mL at 11/08/22 1012   traZODone (DESYREL) tablet 50 mg  50 mg Oral QHS PRN [name], DO   50 mg at 11/30/22 2134    Lab Results: No results found for this or any previous visit (from the past 48 hour(s)).  Blood Alcohol level:  Lab Results  Component Value Date   ETH <10 08/17/2022   ETH <10 01/11/2021      Musculoskeletal: Strength & Muscle Tone: within normal limits Gait & Station: normal Patient leans: N/A   Psychiatric Specialty Exam:   Presentation  General Appearance:  Casual; Neat   Eye Contact: Fair   Speech: Spontaneous   Speech Volume: Normal   Handedness: Right     Mood and Affect  Mood: " Fine"   Affect: Stable    Thought Processes: Improved,probably at baseline.    Descriptions of Associations: Intact   Orientation: Well-oriented   Thought Content:Denies SI/HI, at baseline   Hallucinations:Denies ,   Ideas of Reference:None noted   Suicidal Thoughts:Denies SI  Homicidal Thoughts:Denies HI   Sensorium  Memory: Immediate Fair; Remote Poor   Judgment: Fair, pt is Rx compliant   Insight: Limited     Executive Functions  Concentration: Improved   Attention Span: Improved   Language: Fair     Psychomotor Activity  Psychomotor Activity: Normal  Assets  Assets: Communication Skills     Sleep  Sleep:Improved     Physical Exam: Physical Exam Vitals and nursing note reviewed.  Constitutional:       Appearance: Normal appearance. She is normal weight.  Neurological:     General: No focal deficit present.     Mental Status: She is alert.      Review of Systems  Constitutional: Negative.   HENT: Negative.    Eyes: Negative.   Respiratory: Negative.    Cardiovascular: Negative.   Gastrointestinal: Negative.   Genitourinary: Negative.   Musculoskeletal: Negative.   Skin: Negative.   Neurological: Negative.   Endo/Heme/Allergies: Negative.     Blood pressure 101/62, pulse 70, temperature 97.9 F (36.6  C), resp. rate 18, height 5\' 6"  (1.676 m), weight 64 kg, SpO2 100%. Body mass index is 22.76 kg/m.   Treatment Plan Summary: Daily contact with patient to assess and evaluate symptoms and progress in treatment, Medication management, and Plan continue current medications.  Continue to monitor patient on as needed meds Patient has received Invega Sustenna 156 mg IM on 11/13/2022, the previous dose was was given on 10/15/2022.   Patient received another dose of Invega Sustenna 156 mg IM on 11/18/2022 This was discussed with pharmacist.  The incident was reported to safety zone, to prevent an incident like this in the future.  Patient was encouraged to drink fluids and keep herself hydrated QTc interval 431, 11/27/22    [name], MD

## 2022-12-02 NOTE — Group Note (Signed)
Date:  12/02/2022 Time:  10:39 PM  Group Topic/Focus:  Developing a Wellness Toolbox:   The focus of this group is to help patients develop a "wellness toolbox" with skills and strategies to promote recovery upon discharge.    Participation Level:  Did Not Attend  Participation Quality:      Affect:      Cognitive:      Insight: None  Engagement in Group:  None  Modes of Intervention:      Additional Comments:    [name] 12/02/2022, 10:39 PM

## 2022-12-02 NOTE — BHH Counselor (Signed)
Sign for placement

## 2022-12-02 NOTE — BH IP Treatment Plan (Signed)
Interdisciplinary Treatment and Diagnostic Plan Update  12/02/2022 Time of Session: 9:00 AM  Betty Henderson MRN: [id number]  Principal Diagnosis: Schizoaffective disorder, bipolar type (HCC)  Secondary Diagnoses: Principal Problem:   Schizoaffective disorder, bipolar type (HCC)   Current Medications:  Current Facility-Administered Medications  Medication Dose Route Frequency Provider Last Rate Last Admin   acetaminophen (TYLENOL) tablet 650 mg  650 mg Oral Q6H PRN [name], DO   650 mg at 11/27/22 2122   alum & mag hydroxide-simeth (MAALOX/MYLANTA) 200-200-20 MG/5ML suspension 30 mL  30 mL Oral Q4H PRN [name], DO   30 mL at 09/20/22 1142   diphenhydrAMINE (BENADRYL) capsule 50 mg  50 mg Oral Q6H PRN [name], DO   50 mg at 09/29/22 2254   Or   diphenhydrAMINE (BENADRYL) injection 50 mg  50 mg Intramuscular Q6H PRN [name], DO       haloperidol (HALDOL) tablet 5 mg  5 mg Oral Q6H PRN [name], DO   5 mg at 09/28/22 2149   Or   haloperidol lactate (HALDOL) injection 5 mg  5 mg Intramuscular Q6H PRN [name], DO       ibuprofen (ADVIL) tablet 600 mg  600 mg Oral Q6H PRN [name], [name], MD   600 mg at 12/01/22 2100   LORazepam (ATIVAN) tablet 1 mg  1 mg Oral TID PRN [name], DO   1 mg at 09/27/22 0025   magnesium hydroxide (MILK OF MAGNESIA) suspension 30 mL  30 mL Oral Daily PRN [name], DO   30 mL at 11/08/22 1012   traZODone (DESYREL) tablet 50 mg  50 mg Oral QHS PRN [name], DO   50 mg at 11/30/22 2134   PTA Medications: Medications Prior to Admission  Medication Sig Dispense Refill Last Dose   ondansetron (ZOFRAN) 4 MG tablet Take 1 tablet (4 mg total) by mouth every 8 (eight) hours as needed for nausea or vomiting. (Patient not taking: Reported on 08/17/2022) 20 tablet 0     Patient Stressors: Financial difficulties   Health problems    Medication change or noncompliance    Patient Strengths: Ability for insight   Treatment Modalities: Medication Management, Group therapy, Case management,  1 to 1 session with clinician, Psychoeducation, Recreational therapy.   Physician Treatment Plan for Primary Diagnosis: Schizoaffective disorder, bipolar type (HCC) Long Term Goal(s): Improvement in symptoms so as ready for discharge   Short Term Goals: Ability to identify changes in lifestyle to reduce recurrence of condition will improve Ability to verbalize feelings will improve Ability to disclose and discuss suicidal ideas Ability to demonstrate self-control will improve Ability to identify and develop effective coping behaviors will improve Ability to maintain clinical measurements within normal limits will improve Compliance with prescribed medications will improve Ability to identify triggers associated with substance abuse/mental health issues will improve  Medication Management: Evaluate patient's response, side effects, and tolerance of medication regimen.  Therapeutic Interventions: 1 to 1 sessions, Unit Group sessions and Medication administration.  Evaluation of Outcomes: Progressing  Physician Treatment Plan for Secondary Diagnosis: Principal Problem:   Schizoaffective disorder, bipolar type (HCC)  Long Term Goal(s): Improvement in symptoms so as ready for discharge   Short Term Goals: Ability to identify changes in lifestyle to reduce recurrence of condition will improve Ability to verbalize feelings will improve Ability to disclose and discuss suicidal ideas Ability to demonstrate self-control will improve Ability to identify  and develop effective coping behaviors will improve Ability to maintain clinical measurements within normal limits will improve Compliance with prescribed medications will improve Ability to identify triggers associated with substance abuse/mental health issues will improve      Medication Management: Evaluate patient's response, side effects, and tolerance of medication regimen.  Therapeutic Interventions: 1 to 1 sessions, Unit Group sessions and Medication administration.  Evaluation of Outcomes: Progressing   RN Treatment Plan for Primary Diagnosis: Schizoaffective disorder, bipolar type (HCC) Long Term Goal(s): Knowledge of disease and therapeutic regimen to maintain health will improve  Short Term Goals: Ability to remain free from injury will improve, Ability to verbalize frustration and anger appropriately will improve, Ability to demonstrate self-control, Ability to participate in decision making will improve, Ability to verbalize feelings will improve, Ability to disclose and discuss suicidal ideas, Ability to identify and develop effective coping behaviors will improve, and Compliance with prescribed medications will improve  Medication Management: RN will administer medications as ordered by provider, will assess and evaluate patient's response and provide education to patient for prescribed medication. RN will report any adverse and/or side effects to prescribing provider.  Therapeutic Interventions: 1 on 1 counseling sessions, Psychoeducation, Medication administration, Evaluate responses to treatment, Monitor vital signs and CBGs as ordered, Perform/monitor CIWA, COWS, AIMS and Fall Risk screenings as ordered, Perform wound care treatments as ordered.  Evaluation of Outcomes: Progressing   LCSW Treatment Plan for Primary Diagnosis: Schizoaffective disorder, bipolar type (HCC) Long Term Goal(s): Safe transition to appropriate next level of care at discharge, Engage patient in therapeutic group addressing interpersonal concerns.  Short Term Goals: Engage patient in aftercare planning with referrals and resources, Increase social support, Increase ability to appropriately verbalize feelings, Increase emotional regulation, Facilitate acceptance of mental  health diagnosis and concerns, and Increase skills for wellness and recovery  Therapeutic Interventions: Assess for all discharge needs, 1 to 1 time with Social worker, Explore available resources and support systems, Assess for adequacy in community support network, Educate family and significant other(s) on suicide prevention, Complete Psychosocial Assessment, Interpersonal group therapy.  Evaluation of Outcomes: Progressing   Progress in Treatment: Attending groups: Yes. Participating in groups: Yes. Taking medication as prescribed: Yes. Toleration medication: Yes. Family/Significant other contact made: Yes, individual(s) contacted:  Karren Burly, son  Patient understands diagnosis: Yes. Discussing patient identified problems/goals with staff: Yes. Medical problems stabilized or resolved: Yes. Denies suicidal/homicidal ideation: Yes. Issues/concerns per patient self-inventory: No. Other: None  New problem(s) identified: No, Describe:  none   New Short Term/Long Term Goal(s): Update 6/30: none at this time. Update 09/27/2022:  No changes at this time.  Update 10/02/2022:  No changes at this time. Update 10/07/22: No changes at this time 10/12/22: No changes at this time Update 10/18/22: No changes at this time Update 10/23/22: No changes at this time Update 10/28/22: No changes at this time Update 11/07/22: No changes at this time Update 11/12/22: No changes at this time  Update 11/17/22: None at this time. Update 11/22/22: None at this time. Update 11/27/22 No changes at this time. Update 12/02/22 No changes at this time     Patient Goals:  Update 6/30: none at this time. Update 09/27/2022:  No changes at this time. Update 10/02/2022:  No changes at this time. Update 10/07/22: No changes at this time 10/12/22: No changes at this time Update 10/18/22: No changes at this time Update 10/23/22: No changes at this time Update 10/28/22: No changes at this time Update 11/07/22: No  changes at this time Update 11/12/22:  No changes at this time Update 11/17/22: None at this time. Update 11/22/22: None at this time. Update 11/27/22 No changes at this time Update 12/02/22 No changes at this time       Discharge Plan or Barriers: Update 6/30: APS report has been made and patient is being investigated for guardianship needs.  Remains homeless with limited supports. Update 09/27/2022:  No changes at this time.  Update 10/02/2022:  Patient remains safe on the unit at this time.  Patient remains psychotic at this time.  APS report has been made, however, no follow up from the caseworker on her case.  No safe discharge identified.  CSW has requested that application for Medicaid and disability be completed.   Update 10/07/22: No changes at this time 10/12/22: No changes at this time Update 10/18/22: No changes at this time Update 10/23/22: No changes at this time Update 10/23/22: No changes at this time Update 10/28/22: According to [facility], pt's caseworker at DSS, the petition was filed for guardianship on 10/21/22, now awaiting for the petition to be approvedUpdate 11/07/22: No changes at this time Update 11/12/22: CSW sent over patients information to [facility], awaiting a response. CSW awaiting word from DSS regarding pt's petition and what agency will become her guardian. Update 11/17/22: No changes at this time. Update 11/22/22: CSW has sent patients information to multiple facilities that were suggested by leadership, pt has been denied from the facility or the facility does not respond. CSW continues to send pt's information to nursing homes. Update 11/27/22 CSW contacted DSS to inquire about the status of guardianship. No response at this time Update 12/02/22 Supervisor Loraine Leriche contacted DSS who stated they are not taking pt on for guardianship but that Quincy Carnes will be present tomorrow to do a visit with social worker and pt and that pt's son will be signing documentation for placement for the pt.    Reason for  Continuation of Hospitalization: Hallucinations Mania Medication stabilization   Estimated Length of Stay:  Update 6/30: 1-7 days Update 09/27/2022:  TBD Update 10/02/2022:  No changes at this time. UpdaUpdate 10/18/22: No changes at this time te 10/07/22: No changes at this time Update 10/18/22: No changes at this time Update 10/23/22: No changes at this time Update 10/28/22: No changes at this time Update 11/07/22: No changes at this time Update 11/12/22: No changes at this time  Update 11/17/22: No changes at this time. Update 11/22/22: None at this time. Update 11/27/22 No changes at this time. Update 12/02/22 No changes at this time   Last 3 [place] Suicide Severity Risk Score: Flowsheet Row Admission (Current) from 08/22/2022 in [facility] ED from 08/17/2022 in [facility] Emergency Department at [facility] ED from 08/12/2022 in [facility] Emergency Department at [facility]  C-SSRS RISK CATEGORY Low Risk No Risk No Risk       Last [facility] 2/9 Scores:     No data to display          Scribe for Treatment Team: Elza Rafter, Theresia Majors 12/02/2022 4:18 PM

## 2022-12-02 NOTE — Group Note (Signed)
Recreation Therapy Group Note   Group Topic:General Recreation  Group Date: 12/02/2022 Start Time: 1400 End Time: 1445 Facilitators: Rosina Lowenstein, LRT, CTRS Location: Courtyard  Group Description: Emotional Check in. Patient sat and talked with LRT about how they are doing and whatever else is on their mind. LRT provided active listening, reassurance and encouragement. Pts were given the opportunity to listen to music or play cornhole while getting fresh air and sunlight in the courtyard.    Goal Area(s) Addressed: Patient will engage in conversation with LRT. Patient will communicate their wants, needs, or questions.  Patient will practice a new coping skill of "talking to someone".   Affect/Mood: Appropriate and Flat   Participation Level: Active   Participation Quality: Independent   Behavior: Appropriate, Calm, and Cooperative   Speech/Thought Process: Coherent   Insight: Good   Judgement: Good   Modes of Intervention: Guided Discussion   Patient Response to Interventions:  Interested  and Receptive   Education Outcome:  Acknowledges education   Clinical Observations/Individualized Feedback: Betty Henderson was active in their participation of session activities and group discussion. Pt asked LRT if there was an update on her discharge from the hospital. Pt shared that if it were up to her, then she would love to go "back home to [facility]" and that she can find somewhere to live. LRT encouraged pt to speak with SW regarding discharge plans.    Plan: Continue to engage patient in RT group sessions 2-3x/week.   Rosina Lowenstein, LRT, CTRS 12/02/2022 3:26 PM

## 2022-12-02 NOTE — Plan of Care (Signed)
  Problem: Nutrition: Goal: Adequate nutrition will be maintained Outcome: Progressing   Problem: Pain Managment: Goal: General experience of comfort will improve Outcome: Progressing   Problem: Safety: Goal: Ability to remain free from injury will improve Outcome: Progressing   

## 2022-12-02 NOTE — Progress Notes (Signed)
Patient is present in dayroom for breakfast.  Flat, sadaffect.  Denies SI/HI.  Denies feelings of anxiety of depression. Denies pain.    No scheduled medications.  15 min checks in place for safety.  Patient isolates to room with exception of meals. Participated in MHT and RT groups in the courtyard.  Not observed responding to internal stimuli.

## 2022-12-03 DIAGNOSIS — F250 Schizoaffective disorder, bipolar type: Secondary | ICD-10-CM | POA: Diagnosis not present

## 2022-12-03 NOTE — Plan of Care (Signed)

## 2022-12-03 NOTE — BHH Counselor (Signed)
CSW contacted Quincy Carnes [phone]) in order to confirm time of visit today, unable to reach, LVM requesting return call.   Reynaldo Minium, MSW, [place] 12/03/2022 9:59 AM

## 2022-12-03 NOTE — Progress Notes (Signed)
Betty Henderson rested throughout the shift in her room.  She came to the dayroom for snack then returned to her room.  No s/sx. of acute distress.  No medications given this shift, but Garielle asked because she use to get a bedtime medication.  This Clinical research associate explained that it was discontinued.  Q74m safety checks competed.  No complaints voiced.  She was heard to talking to herself while she was alone in her room.  Continue plan of care.

## 2022-12-03 NOTE — Progress Notes (Signed)
Patient present in dayroom for breakfast.  Flat, sad affect.  Reports she slept well.  Denies SI/HI and AVH.  Denies pain.  Good appetite.  No scheduled medications.  15 min checks in place for safety.  Isolates to room.  Minimal interaction with peers and staff.  Patient did go to courtyard for MHT group. Also participated in RT group.

## 2022-12-03 NOTE — Group Note (Signed)
Recreation Therapy Group Note   Group Topic:Coping Skills  Group Date: 12/03/2022 Start Time: 1400 End Time: 1455 Facilitators: Rosina Lowenstein, LRT, CTRS Location:  Day Room  Group Description: Chair Yoga. LRT and patients discussed the benefits of yoga and how it differs from strength exercises. LRT educated patients on the mental and physical benefits of yoga and deep breathing and how it can be used as a Associate Professor. LRT and patients followed along to a guided yoga session on the television that focused on all parts of the body, as well as deep breathing. Pt encouraged to stop movement at any time if they feel discomfort or pain.   Goal Area(s) Addressed: Patient will practice using relaxation technique. Patient will identify a new coping skill.  Patient will follow multistep directions to reduce anxiety and stress.   Affect/Mood: Appropriate and Flat   Participation Level: Moderate   Participation Quality: Independent   Behavior: Appropriate and Calm   Speech/Thought Process: Coherent   Insight: Fair   Judgement: Good   Modes of Intervention: Activity   Patient Response to Interventions:  Receptive   Education Outcome:  Acknowledges education   Clinical Observations/Individualized Feedback: Betty Henderson was somewhat active in their participation of session activities and group discussion. Pt completed most of the demonstrated exercises appropriately. Pt asked to go outside after the yoga session was complete. Pt, LRT and peers went outside to the courtyard for fresh air and sunlight afterwards.    Plan: Continue to engage patient in RT group sessions 2-3x/week.   Rosina Lowenstein, LRT, CTRS 12/03/2022 3:12 PM

## 2022-12-03 NOTE — Progress Notes (Signed)
[facility] MD Progress Note  12/03/2022 11:43 AM Betty Henderson  MRN:  [id number] Subjective: Betty Henderson is seen on rounds.  She denies any side effects from her medications.  There is no evidence of EPS or TD.  Social work continues to work with DSS on discharge planning.  She has been pleasant, cooperative, and in good controls on the unit.  She has been participating in groups.  She is eating well and sleeping well.  Nurses report no issues. Principal Problem: Schizoaffective disorder, bipolar type (HCC) Diagnosis: Principal Problem:   Schizoaffective disorder, bipolar type (HCC)  Total Time spent with patient: 15 minutes  Past Psychiatric History: Schizoaffective disorder, bipolar type  Past Medical History:  Past Medical History:  Diagnosis Date   Anemia    Arthritis    Chronic pain    Drug-seeking behavior    Malingering    Osteopetrosis    Psychosis (HCC)    Schizoaffective disorder, bipolar type (HCC)     Past Surgical History:  Procedure Laterality Date   MOUTH SURGERY     TUBAL LIGATION     Family History:  Family History  Family history unknown: Yes   Family Psychiatric  History: Unremarkable Social History:  Social History   Substance and Sexual Activity  Alcohol Use Not Currently   Comment: 1 cocktail 3 weeks ago     Social History   Substance and Sexual Activity  Drug Use Not Currently   Types: Cocaine   Comment: states "it's legal though"    Social History   Socioeconomic History   Marital status: Single    Spouse name: Not on file   Number of children: Not on file   Years of education: Not on file   Highest education level: Not on file  Occupational History   Not on file  Tobacco Use   Smoking status: Every Day    Current packs/day: 0.50    Types: Cigarettes   Smokeless tobacco: Never  Vaping Use   Vaping status: Never Used  Substance and Sexual Activity   Alcohol use: Not Currently    Comment: 1 cocktail 3 weeks ago   Drug use: Not  Currently    Types: Cocaine    Comment: states "it's legal though"   Sexual activity: Never  Other Topics Concern   Not on file  Social History Narrative   Not on file   Social Determinants of Health   Financial Resource Strain: Not on file  Food Insecurity: Food Insecurity Present (08/22/2022)   Hunger Vital Sign    Worried About Running Out of Food in the Last Year: Sometimes true    Ran Out of Food in the Last Year: Sometimes true  Transportation Needs: Patient Unable To Answer (08/22/2022)   PRAPARE - Transportation    Lack of Transportation (Medical): Patient unable to answer    Lack of Transportation (Non-Medical): Patient unable to answer  Recent Concern: Transportation Needs - Unmet Transportation Needs (06/13/2022)   Received from [facility], [facility]   [facility] - Transportation    Lack of Transportation (Medical): Not on file    Lack of Transportation (Non-Medical): Yes  Physical Activity: Not on file  Stress: Not on file  Social Connections: Unknown (08/06/2021)   Received from [facility], [facility]   Social Network    Social Network: Not on file   Additional Social History:  Sleep: Good  Appetite:  Good  Current Medications: Current Facility-Administered Medications  Medication Dose Route Frequency Provider Last Rate Last Admin   acetaminophen (TYLENOL) tablet 650 mg  650 mg Oral Q6H PRN [name], DO   650 mg at 11/27/22 2122   alum & mag hydroxide-simeth (MAALOX/MYLANTA) 200-200-20 MG/5ML suspension 30 mL  30 mL Oral Q4H PRN [name], DO   30 mL at 09/20/22 1142   diphenhydrAMINE (BENADRYL) capsule 50 mg  50 mg Oral Q6H PRN [name], DO   50 mg at 09/29/22 2254   Or   diphenhydrAMINE (BENADRYL) injection 50 mg  50 mg Intramuscular Q6H PRN [name], DO       haloperidol (HALDOL) tablet 5 mg  5 mg Oral Q6H PRN [name], DO   5 mg at 09/28/22  2149   Or   haloperidol lactate (HALDOL) injection 5 mg  5 mg Intramuscular Q6H PRN [name], DO       ibuprofen (ADVIL) tablet 600 mg  600 mg Oral Q6H PRN [name], [name], MD   600 mg at 12/01/22 2100   LORazepam (ATIVAN) tablet 1 mg  1 mg Oral TID PRN [name], DO   1 mg at 09/27/22 0025   magnesium hydroxide (MILK OF MAGNESIA) suspension 30 mL  30 mL Oral Daily PRN [name], DO   30 mL at 11/08/22 1012   traZODone (DESYREL) tablet 50 mg  50 mg Oral QHS PRN [name], DO   50 mg at 11/30/22 2134    Lab Results: No results found for this or any previous visit (from the past 48 hour(s)).  Blood Alcohol level:  Lab Results  Component Value Date   ETH <10 08/17/2022   ETH <10 01/11/2021    Metabolic Disorder Labs: No results found for: "HGBA1C", "MPG" No results found for: "PROLACTIN" No results found for: "CHOL", "TRIG", "HDL", "CHOLHDL", "VLDL", "LDLCALC"  Physical Findings: AIMS:  , ,  ,  ,    CIWA:    COWS:     Musculoskeletal: Strength & Muscle Tone: within normal limits Gait & Station: normal Patient leans: N/A  Psychiatric Specialty Exam:  Presentation  General Appearance:  Casual; Neat  Eye Contact: Fair  Speech: Slow; Slurred  Speech Volume: Decreased  Handedness: Right   Mood and Affect  Mood: Anxious  Affect: Inappropriate   Thought Process  Thought Processes: Disorganized  Descriptions of Associations:Loose  Orientation:Partial  Thought Content:Illogical; Rumination  History of Schizophrenia/Schizoaffective disorder:No data recorded Duration of Psychotic Symptoms:No data recorded Hallucinations:No data recorded Ideas of Reference:Paranoia  Suicidal Thoughts:No data recorded Homicidal Thoughts:No data recorded  Sensorium  Memory: Immediate Fair; Remote Poor  Judgment: Poor  Insight: Poor   Executive Functions  Concentration: Poor  Attention  Span: Fair  Recall: [employer] of Knowledge: Fair  Language: Fair   Psychomotor Activity  Psychomotor Activity:No data recorded  Assets  Assets: Communication Skills   Sleep  Sleep:No data recorded   Physical Exam: Physical Exam Vitals and nursing note reviewed.  Constitutional:      Appearance: Normal appearance. She is normal weight.  Neurological:     General: No focal deficit present.     Mental Status: She is alert and oriented to person, place, and time.  Psychiatric:        Attention and Perception: Attention and perception normal.        Mood and Affect: Mood and  affect normal.        Speech: Speech normal.        Behavior: Behavior normal. Behavior is cooperative.        Thought Content: Thought content normal.        Cognition and Memory: Cognition and memory normal.        Judgment: Judgment normal.    Review of Systems  Constitutional: Negative.   HENT: Negative.    Eyes: Negative.   Respiratory: Negative.    Cardiovascular: Negative.   Gastrointestinal: Negative.   Genitourinary: Negative.   Musculoskeletal: Negative.   Skin: Negative.   Neurological: Negative.   Endo/Heme/Allergies: Negative.   Psychiatric/Behavioral: Negative.     Blood pressure 111/71, pulse 78, temperature 97.7 F (36.5 C), resp. rate 18, height 5\' 6"  (1.676 m), weight 64 kg, SpO2 96%. Body mass index is 22.76 kg/m.   Treatment Plan Summary: Daily contact with patient to assess and evaluate symptoms and progress in treatment, Medication management, and Plan continue current medications.  [name], DO 12/03/2022, 11:43 AM

## 2022-12-03 NOTE — Progress Notes (Signed)
   12/03/22 2100  Psych Admission Type (Psych Patients Only)  Admission Status Voluntary  Psychosocial Assessment  Patient Complaints None  Eye Contact Fair  Facial Expression Flat  Affect Flat  Speech Soft  Interaction Minimal  Motor Activity Slow;Shuffling  Appearance/Hygiene Layered clothes  Behavior Characteristics Cooperative;Appropriate to situation  Mood Pleasant  Thought Process  Coherency WDL  Content Preoccupation  Delusions None reported or observed  Perception WDL  Hallucination None reported or observed  Judgment Impaired  Confusion None  Danger to Self  Current suicidal ideation? Denies  Agreement Not to Harm Self Yes  Description of Agreement verbal  Danger to Others  Danger to Others None reported or observed  Danger to Others Abnormal  Harmful Behavior to others No threats or harm toward other people  Destructive Behavior No threats or harm toward property

## 2022-12-03 NOTE — Group Note (Signed)
Date:  12/03/2022 Time:  8:18 PM  Group Topic/Focus:  Wrap-Up Group:   The focus of this group is to help patients review their daily goal of treatment and discuss progress on daily workbooks.    Participation Level:  Did Not Attend  Participation Quality:   none  Affect:   none  Cognitive:   none  Insight: None  Engagement in Group:  None   Wakhaila H Zonia Caplin 12/03/2022, 8:18 PM

## 2022-12-03 NOTE — BHH Counselor (Signed)
  CSW contacted Betty Henderson, [phone] to discuss if visit would be happening today as stated by her supervisor Rodena Medin.   Shonte states that she was never coming to do the visit today, only calling to schedule the visit.   CSW states there may have been a miscommunication but that CSW and Leanora Cover should try and schedule it now that they are on the phone.   Shonte stated she received no calls from CSW.   CSW stated she has been Civil engineer, contracting and has documentation regarding the calls that were made.   Shonte's tone was considerably unprofessional.   CSW attempts to redirect conversation to scheduling the visit.   Leanora Cover states she can come Thursday 1:00 PM to do visit.   CSW stated that would work well.   Reynaldo Minium, MSW, [place] 12/03/2022 1:51 PM

## 2022-12-04 DIAGNOSIS — F250 Schizoaffective disorder, bipolar type: Secondary | ICD-10-CM | POA: Diagnosis not present

## 2022-12-04 NOTE — Group Note (Signed)
[facility] LCSW Group Therapy Note   Group Date: 12/04/2022 Start Time: 1315 End Time: 1400   Type of Therapy/Topic:  Group Therapy:  Emotion Regulation  Participation Level:  Active   Mood:  Description of Group:    The purpose of this group is to assist patients in learning to regulate negative emotions and experience positive emotions. Patients will be guided to discuss ways in which they have been vulnerable to their negative emotions. These vulnerabilities will be juxtaposed with experiences of positive emotions or situations, and patients challenged to use positive emotions to combat negative ones. Special emphasis will be placed on coping with negative emotions in conflict situations, and patients will process healthy conflict resolution skills.  Therapeutic Goals: Patient will identify two positive emotions or experiences to reflect on in order to balance out negative emotions:  Patient will label two or more emotions that they find the most difficult to experience:  Patient will be able to demonstrate positive conflict resolution skills through discussion or role plays:   Summary of Patient Progress:   Pt was able to discuss positive emotions and identify two  things that she was looking forward to and ways she could cope with negative emotions caused by stress and life events.     Therapeutic Modalities:   Cognitive Behavioral Therapy Feelings Identification Dialectical Behavioral Therapy   Elza Rafter, LCSWA

## 2022-12-04 NOTE — Progress Notes (Signed)
   12/04/22 0601  15 Minute Checks  Location Bedroom  Visual Appearance Calm  Behavior Sleeping  Sleep (Behavioral Health Patients Only)  Calculate sleep? (Click Yes once per 24 hr at 0600 safety check) Yes  Documented sleep last 24 hours 9.5

## 2022-12-04 NOTE — Group Note (Signed)
Recreation Therapy Group Note   Group Topic:Other  Group Date: 12/04/2022 Start Time: 1400 End Time: 1455 Facilitators: Rosina Lowenstein, LRT, CTRS Location:  Craft Room and Courtyard   Group Description: Bingo. LRT and patients played multiple games of Bingo with music playing in the background. LRT and pts discussed how this could be a leisure interest and the importance of doing things they enjoy post-discharge.   Goal Area(s) Addressed: Patient will identify leisure interests.  Patient will practice healthy decision making. Patient will engage in recreation activity.    Affect/Mood: Appropriate and Flat   Participation Level: Active and Engaged   Participation Quality: Independent   Behavior: Calm and Cooperative   Speech/Thought Process: Coherent   Insight: Good   Judgement: Good   Modes of Intervention: Activity   Patient Response to Interventions:  Attentive, Engaged, Interested , and Receptive   Education Outcome:  Acknowledges education   Clinical Observations/Individualized Feedback: Jazzlyne was active in their participation of session activities and group discussion. Pt played games of bingo and chose to go outside with peers afterwards. Pt interacted well with LRT and peers duration of session.   Plan: Continue to engage patient in RT group sessions 2-3x/week.   Rosina Lowenstein, LRT, CTRS 12/04/2022 3:01 PM

## 2022-12-04 NOTE — Progress Notes (Signed)
   12/04/22 0723  Psych Admission Type (Psych Patients Only)  Admission Status Voluntary  Psychosocial Assessment  Patient Complaints None  Eye Contact Fair  Facial Expression Flat  Affect Sullen  Speech Soft  Interaction Minimal  Motor Activity Shuffling  Appearance/Hygiene Layered clothes;Disheveled;Bizarre  Behavior Characteristics Cooperative  Mood Pleasant  Thought Process  Coherency WDL  Content WDL  Delusions None reported or observed  Perception WDL  Hallucination None reported or observed  Judgment Impaired  Confusion None  Danger to Self  Current suicidal ideation? Denies  Danger to Others  Danger to Others None reported or observed

## 2022-12-04 NOTE — Progress Notes (Signed)
[facility] -  MD Progress Note  12/04/2022 10:42 AM Betty Henderson  MRN:  [id number] Subjective: Betty Henderson is seen on rounds.  She is doing well.  She has been in good controls.  She is eating and sleeping well.  Nurses report no issues.  She has been compliant with medications without any side effects.  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  She needs to coordinate that with DSS which makes it difficult. Principal Problem: Schizoaffective disorder, bipolar type (HCC) Diagnosis: Principal Problem:   Schizoaffective disorder, bipolar type (HCC)  Total Time spent with patient: 15 minutes  Past Psychiatric History: Schizoaffective disorder  Past Medical History:  Past Medical History:  Diagnosis Date   Anemia    Arthritis    Chronic pain    Drug-seeking behavior    Malingering    Osteopetrosis    Psychosis (HCC)    Schizoaffective disorder, bipolar type (HCC)     Past Surgical History:  Procedure Laterality Date   MOUTH SURGERY     TUBAL LIGATION     Family History:  Family History  Family history unknown: Yes   Family Psychiatric  History: Unremarkable Social History:  Social History   Substance and Sexual Activity  Alcohol Use Not Currently   Comment: 1 cocktail 3 weeks ago     Social History   Substance and Sexual Activity  Drug Use Not Currently   Types: Cocaine   Comment: states "it's legal though"    Social History   Socioeconomic History   Marital status: Single    Spouse name: Not on file   Number of children: Not on file   Years of education: Not on file   Highest education level: Not on file  Occupational History   Not on file  Tobacco Use   Smoking status: Every Day    Current packs/day: 0.50    Types: Cigarettes   Smokeless tobacco: Never  Vaping Use   Vaping status: Never Used  Substance and Sexual Activity   Alcohol use: Not Currently    Comment: 1 cocktail 3 weeks ago   Drug use: Not Currently    Types: Cocaine    Comment: states "it's  legal though"   Sexual activity: Never  Other Topics Concern   Not on file  Social History Narrative   Not on file   Social Determinants of Health   Financial Resource Strain: Not on file  Food Insecurity: Food Insecurity Present (08/22/2022)   Hunger Vital Sign    Worried About Running Out of Food in the Last Year: Sometimes true    Ran Out of Food in the Last Year: Sometimes true  Transportation Needs: Patient Unable To Answer (08/22/2022)   PRAPARE - Transportation    Lack of Transportation (Medical): Patient unable to answer    Lack of Transportation (Non-Medical): Patient unable to answer  Recent Concern: Transportation Needs - Unmet Transportation Needs (06/13/2022)   Received from [facility]   [facility] - Transportation    Lack of Transportation (Medical): Not on file    Lack of Transportation (Non-Medical): Yes  Physical Activity: Not on file  Stress: Not on file  Social Connections: Unknown (08/06/2021)   Received from [facility], [facility]   Social Network    Social Network: Not on file   Additional Social History:                         Sleep: Good  Appetite:  Good  Current Medications: Current Facility-Administered Medications  Medication Dose Route Frequency Provider Last Rate Last Admin   acetaminophen (TYLENOL) tablet 650 mg  650 mg Oral Q6H PRN [name], DO   650 mg at 11/27/22 2122   alum & mag hydroxide-simeth (MAALOX/MYLANTA) 200-200-20 MG/5ML suspension 30 mL  30 mL Oral Q4H PRN [name], DO   30 mL at 09/20/22 1142   diphenhydrAMINE (BENADRYL) capsule 50 mg  50 mg Oral Q6H PRN [name], DO   50 mg at 09/29/22 2254   Or   diphenhydrAMINE (BENADRYL) injection 50 mg  50 mg Intramuscular Q6H PRN [name], DO       haloperidol (HALDOL) tablet 5 mg  5 mg Oral Q6H PRN [name], DO   5 mg at 09/28/22 2149   Or   haloperidol lactate (HALDOL) injection 5  mg  5 mg Intramuscular Q6H PRN [name], DO       ibuprofen (ADVIL) tablet 600 mg  600 mg Oral Q6H PRN [name], [name], MD   600 mg at 12/01/22 2100   LORazepam (ATIVAN) tablet 1 mg  1 mg Oral TID PRN [name], DO   1 mg at 09/27/22 0025   magnesium hydroxide (MILK OF MAGNESIA) suspension 30 mL  30 mL Oral Daily PRN [name], DO   30 mL at 11/08/22 1012   traZODone (DESYREL) tablet 50 mg  50 mg Oral QHS PRN [name], DO   50 mg at 11/30/22 2134    Lab Results: No results found for this or any previous visit (from the past 48 hour(s)).  Blood Alcohol level:  Lab Results  Component Value Date   ETH <10 08/17/2022   ETH <10 01/11/2021    Metabolic Disorder Labs: No results found for: "HGBA1C", "MPG" No results found for: "PROLACTIN" No results found for: "CHOL", "TRIG", "HDL", "CHOLHDL", "VLDL", "LDLCALC"  Physical Findings: AIMS:  , ,  ,  ,    CIWA:    COWS:     Musculoskeletal: Strength & Muscle Tone: within normal limits Gait & Station: normal Patient leans: N/A  Psychiatric Specialty Exam:  Presentation  General Appearance:  Casual; Neat  Eye Contact: Fair  Speech: Slow; Slurred  Speech Volume: Decreased  Handedness: Right   Mood and Affect  Mood: Anxious  Affect: Inappropriate   Thought Process  Thought Processes: Disorganized  Descriptions of Associations:Loose  Orientation:Partial  Thought Content:Illogical; Rumination  History of Schizophrenia/Schizoaffective disorder:No data recorded Duration of Psychotic Symptoms:No data recorded Hallucinations:No data recorded Ideas of Reference:Paranoia  Suicidal Thoughts:No data recorded Homicidal Thoughts:No data recorded  Sensorium  Memory: Immediate Fair; Remote Poor  Judgment: Poor  Insight: Poor   Executive Functions  Concentration: Poor  Attention Span: Fair  Recall: [employer] of  Knowledge: Fair  Language: Fair   Psychomotor Activity  Psychomotor Activity:No data recorded  Assets  Assets: Communication Skills   Sleep  Sleep:No data recorded    Blood pressure 124/68, pulse 61, temperature (!) 97.3 F (36.3 C), resp. rate 17, height 5\' 6"  (1.676 m), weight 64 kg, SpO2 100%. Body mass index is 22.76 kg/m.   Treatment Plan Summary: Daily contact with patient to assess and evaluate symptoms and progress in treatment, Medication management, and Plan continue current medication.  [name], DO 12/04/2022, 10:42 AM

## 2022-12-04 NOTE — Group Note (Signed)
Date:  12/04/2022 Time:  11:36 PM  Group Topic/Focus:  Healthy Communication:   The focus of this group is to discuss communication, barriers to communication, as well as healthy ways to communicate with others.    Participation Level:  Active  Participation Quality:  Appropriate  Affect:  Appropriate  Cognitive:  Appropriate  Insight: Improving  Engagement in Group:  Engaged  Modes of Intervention:  Discussion  Additional Comments:    [name] 12/04/2022, 11:36 PM

## 2022-12-05 DIAGNOSIS — F250 Schizoaffective disorder, bipolar type: Secondary | ICD-10-CM | POA: Diagnosis not present

## 2022-12-05 NOTE — Group Note (Signed)
Recreation Therapy Group Note   Group Topic:Leisure Education  Group Date: 12/05/2022 Start Time: 1400 End Time: 1450 Facilitators: Rosina Lowenstein, LRT, CTRS Location: Courtyard  Group Description: Leisure. Patients were given the opportunity to play ring toss, play corn hole, or listen to music while sitting in the courtyard getting fresh air and sunlight. Pt identified and conversated about things they enjoy doing in their free time and how they can continue to do that outside of the hospital.   Goal Area(s) Addressed: Patient will learn the definition of "leisure". Patient will practice making a positive decision. Patient will have the opportunity to try a new leisure activity. Patient will communicate with peers and LRT.   Affect/Mood: Appropriate and Flat   Participation Level: Moderate   Participation Quality: Independent   Behavior: Calm and Cooperative   Speech/Thought Process: Coherent   Insight: Fair   Judgement: Fair    Modes of Intervention: Activity   Patient Response to Interventions:  Receptive   Education Outcome:  Acknowledges education   Clinical Observations/Individualized Feedback: Betty Henderson was active in their participation of session activities and group discussion. Pt identified "Can we listen to the temptations?" Pt minimally interacted with LRT or peers while outside. Pt chose to listen to music while outside in the courtyard.    Plan: Continue to engage patient in RT group sessions 2-3x/week.   Rosina Lowenstein, LRT, CTRS 12/05/2022 2:58 PM

## 2022-12-05 NOTE — Plan of Care (Addendum)
D: Pt alert and oriented. Pt denies experiencing any depression however endorses experiencing anxiety related to having a meeting about possible placement today. Pt denies experiencing any pain at this time. Pt denies experiencing any SI/HI, or AVH at this time.   A: Support and encouragement provided. Frequent verbal contact made. Routine safety checks conducted q15 minutes.   R: Pt verbally contracts for safety at this time. Pt compliant with treatment plan. Pt interacts well with others on the unit. Pt remains safe at this time. Plan of care ongoing.  Problem: Education: Goal: Knowledge of General Education information will improve Description: Including pain rating scale, medication(s)/side effects and non-pharmacologic comfort measures Outcome: Progressing   Problem: Nutrition: Goal: Adequate nutrition will be maintained Outcome: Progressing

## 2022-12-05 NOTE — Progress Notes (Signed)
   12/04/22 2200  Psych Admission Type (Psych Patients Only)  Admission Status Voluntary  Psychosocial Assessment  Patient Complaints None  Eye Contact Fair  Facial Expression Flat  Affect Flat  Speech Soft  Interaction Minimal  Motor Activity Shuffling  Appearance/Hygiene Layered clothes  Behavior Characteristics Cooperative  Mood Pleasant  Thought Process  Coherency WDL  Content WDL  Delusions None reported or observed  Perception WDL  Hallucination None reported or observed  Judgment Impaired  Confusion None  Danger to Self  Current suicidal ideation? Denies  Agreement Not to Harm Self Yes  Description of Agreement Verbal  Danger to Others  Danger to Others None reported or observed

## 2022-12-05 NOTE — BHH Counselor (Signed)
CSW met with with Betty Henderson at 12:15 PM.   CSW took [employer] to quiet room and brought in pt.   Betty Henderson inquired about pt's access to housing and relationship with family members.   Pt unable to explain due to tangential speech.   Betty Henderson states to CSW that she can see if pt has active [employer], if not she will work on getting pt reinstated with Betty Henderson,   [employer] informed CSW that pt's son Betty Henderson stated he "wasn't fucking doing it" when asked if he was going to take guardianship of pt.   Betty Henderson stated once pt gets [employer] she can get a Manufacturing engineer through [facility] DSS to help with placement.   CSW will inform providers and leadership    Reynaldo Minium, MSW, [facility] 12/05/2022 4:18 PM

## 2022-12-05 NOTE — Progress Notes (Signed)
[facility] MD Progress Note  12/05/2022 1:32 PM Betty Henderson  MRN:  [id number] Subjective: Betty Henderson is seen on rounds.  Social work is still working on Building control surveyor.  She does have a guardian.  She has been compliant with medication.  She has been pleasant cooperative on the unit.  No side effects from her medicines.  Nurses report no issues. Principal Problem: Schizoaffective disorder, bipolar type (HCC) Diagnosis: Principal Problem:   Schizoaffective disorder, bipolar type (HCC)  Total Time spent with patient: 15 minutes  Past Psychiatric History: Long history of schizoaffective disorder  Past Medical History:  Past Medical History:  Diagnosis Date   Anemia    Arthritis    Chronic pain    Drug-seeking behavior    Malingering    Osteopetrosis    Psychosis (HCC)    Schizoaffective disorder, bipolar type (HCC)     Past Surgical History:  Procedure Laterality Date   MOUTH SURGERY     TUBAL LIGATION     Family History:  Family History  Family history unknown: Yes   Family Psychiatric  History: Unremarkable Social History:  Social History   Substance and Sexual Activity  Alcohol Use Not Currently   Comment: 1 cocktail 3 weeks ago     Social History   Substance and Sexual Activity  Drug Use Not Currently   Types: Cocaine   Comment: states "it's legal though"    Social History   Socioeconomic History   Marital status: Single    Spouse name: Not on file   Number of children: Not on file   Years of education: Not on file   Highest education level: Not on file  Occupational History   Not on file  Tobacco Use   Smoking status: Every Day    Current packs/day: 0.50    Types: Cigarettes   Smokeless tobacco: Never  Vaping Use   Vaping status: Never Used  Substance and Sexual Activity   Alcohol use: Not Currently    Comment: 1 cocktail 3 weeks ago   Drug use: Not Currently    Types: Cocaine    Comment: states "it's legal though"   Sexual activity: Never   Other Topics Concern   Not on file  Social History Narrative   Not on file   Social Determinants of Health   Financial Resource Strain: Not on file  Food Insecurity: Food Insecurity Present (08/22/2022)   Hunger Vital Sign    Worried About Running Out of Food in the Last Year: Sometimes true    Ran Out of Food in the Last Year: Sometimes true  Transportation Needs: Patient Unable To Answer (08/22/2022)   PRAPARE - Transportation    Lack of Transportation (Medical): Patient unable to answer    Lack of Transportation (Non-Medical): Patient unable to answer  Recent Concern: Transportation Needs - Unmet Transportation Needs (06/13/2022)   Received from [facility], [facility]   [facility] - Transportation    Lack of Transportation (Medical): Not on file    Lack of Transportation (Non-Medical): Yes  Physical Activity: Not on file  Stress: Not on file  Social Connections: Unknown (08/06/2021)   Received from [facility], [facility]   Social Network    Social Network: Not on file   Additional Social History:                         Sleep: Good  Appetite:  Good  Current Medications: Current Facility-Administered Medications  Medication Dose Route Frequency Provider Last Rate Last Admin   acetaminophen (TYLENOL) tablet 650 mg  650 mg Oral Q6H PRN [name], DO   650 mg at 11/27/22 2122   alum & mag hydroxide-simeth (MAALOX/MYLANTA) 200-200-20 MG/5ML suspension 30 mL  30 mL Oral Q4H PRN [name], DO   30 mL at 09/20/22 1142   diphenhydrAMINE (BENADRYL) capsule 50 mg  50 mg Oral Q6H PRN [name], DO   50 mg at 09/29/22 2254   Or   diphenhydrAMINE (BENADRYL) injection 50 mg  50 mg Intramuscular Q6H PRN [name], DO       haloperidol (HALDOL) tablet 5 mg  5 mg Oral Q6H PRN [name], DO   5 mg at 09/28/22 2149   Or   haloperidol lactate (HALDOL) injection 5 mg  5 mg Intramuscular Q6H PRN [name], DO       ibuprofen (ADVIL) tablet 600 mg  600 mg Oral Q6H PRN [name], [name], MD   600 mg at 12/01/22 2100   LORazepam (ATIVAN) tablet 1 mg  1 mg Oral TID PRN [name], DO   1 mg at 09/27/22 0025   magnesium hydroxide (MILK OF MAGNESIA) suspension 30 mL  30 mL Oral Daily PRN [name], DO   30 mL at 11/08/22 1012   traZODone (DESYREL) tablet 50 mg  50 mg Oral QHS PRN [name], DO   50 mg at 12/04/22 2131    Lab Results: No results found for this or any previous visit (from the past 48 hour(s)).  Blood Alcohol level:  Lab Results  Component Value Date   ETH <10 08/17/2022   ETH <10 01/11/2021    Metabolic Disorder Labs: No results found for: "HGBA1C", "MPG" No results found for: "PROLACTIN" No results found for: "CHOL", "TRIG", "HDL", "CHOLHDL", "VLDL", "LDLCALC"  Physical Findings: AIMS:  , ,  ,  ,    CIWA:    COWS:     Musculoskeletal: Strength & Muscle Tone: within normal limits Gait & Station: normal Patient leans: N/A  Psychiatric Specialty Exam:  Presentation  General Appearance:  Casual; Neat  Eye Contact: Fair  Speech: Slow; Slurred  Speech Volume: Decreased  Handedness: Right   Mood and Affect  Mood: Anxious  Affect: Inappropriate   Thought Process  Thought Processes: Disorganized  Descriptions of Associations:Loose  Orientation:Partial  Thought Content:Illogical; Rumination  History of Schizophrenia/Schizoaffective disorder:No data recorded Duration of Psychotic Symptoms:No data recorded Hallucinations:No data recorded Ideas of Reference:Paranoia  Suicidal Thoughts:No data recorded Homicidal Thoughts:No data recorded  Sensorium  Memory: Immediate Fair; Remote Poor  Judgment: Poor  Insight: Poor   Executive Functions  Concentration: Poor  Attention Span: Fair  Recall: [employer] of Knowledge: Fair  Language: Fair   Psychomotor Activity   Psychomotor Activity:No data recorded  Assets  Assets: Communication Skills   Sleep  Sleep:No data recorded    Blood pressure (!) 118/98, pulse 81, temperature 98.3 F (36.8 C), resp. rate 18, height 5\' 6"  (1.676 m), weight 64 kg, SpO2 100%. Body mass index is 22.76 kg/m.   Treatment Plan Summary: Daily contact with patient to assess and evaluate symptoms and progress in treatment, Medication management, and Plan continue current medication.  Edmund Rick Tresea Mall, DO 12/05/2022, 1:32 PM

## 2022-12-05 NOTE — Plan of Care (Signed)
  Problem: Education: Goal: Knowledge of General Education information will improve Description: Including pain rating scale, medication(s)/side effects and non-pharmacologic comfort measures Outcome: Progressing   Problem: Health Behavior/Discharge Planning: Goal: Ability to manage health-related needs will improve Outcome: Progressing   Problem: Clinical Measurements: Goal: Ability to maintain clinical measurements within normal limits will improve Outcome: Progressing Goal: Diagnostic test results will improve Outcome: Progressing   Problem: Nutrition: Goal: Adequate nutrition will be maintained Outcome: Progressing   Problem: Coping: Goal: Level of anxiety will decrease Outcome: Progressing   Problem: Elimination: Goal: Will not experience complications related to bowel motility Outcome: Progressing Goal: Will not experience complications related to urinary retention Outcome: Progressing   Problem: Pain Managment: Goal: General experience of comfort will improve Outcome: Progressing   Problem: Safety: Goal: Ability to remain free from injury will improve Outcome: Progressing   Problem: Skin Integrity: Goal: Risk for impaired skin integrity will decrease Outcome: Progressing

## 2022-12-05 NOTE — Group Note (Signed)
Date:  12/05/2022 Time:  9:13 PM  Group Topic/Focus:  Identifying Needs:   The focus of this group is to help patients identify their personal needs that have been historically problematic and identify healthy behaviors to address their needs.    Participation Level:  Active  Participation Quality:  Appropriate  Affect:  Appropriate  Cognitive:  Appropriate  Insight: Appropriate  Engagement in Group:  Engaged  Modes of Intervention:  Education  Additional Comments:    [name] 12/05/2022, 9:13 PM

## 2022-12-06 DIAGNOSIS — F250 Schizoaffective disorder, bipolar type: Secondary | ICD-10-CM | POA: Diagnosis not present

## 2022-12-06 NOTE — Plan of Care (Signed)
Problem: Education: Goal: Knowledge of General Education information will improve Description: Including pain rating scale, medication(s)/side effects and non-pharmacologic comfort measures Outcome: Progressing   Problem: Health Behavior/Discharge Planning: Goal: Ability to manage health-related needs will improve Outcome: Progressing   Problem: Clinical Measurements: Goal: Ability to maintain clinical measurements within normal limits will improve Outcome: Progressing Goal: Diagnostic test results will improve Outcome: Progressing   Problem: Nutrition: Goal: Adequate nutrition will be maintained Outcome: Progressing   Problem: Coping: Goal: Level of anxiety will decrease Outcome: Progressing   Problem: Elimination: Goal: Will not experience complications related to bowel motility Outcome: Progressing Goal: Will not experience complications related to urinary retention Outcome: Progressing   Problem: Pain Managment: Goal: General experience of comfort will improve Outcome: Progressing   Problem: Safety: Goal: Ability to remain free from injury will improve Outcome: Progressing   Problem: Skin Integrity: Goal: Risk for impaired skin integrity will decrease Outcome: Progressing

## 2022-12-06 NOTE — Plan of Care (Signed)
D: Pt alert and oriented. Pt denies experiencing any anxiety/depression at this time. Pt denies experiencing any pain at this time. Pt denies experiencing any SI/HI, or AVH at this time.   A: Support and encouragement provided. Frequent verbal contact made. Routine safety checks conducted q15 minutes.   R: Pt verbally contracts for safety at this time. Pt compliant with treatment plan. Pt interacts well with others on the unit. Pt remains safe at this time. Plan of care ongoing.  Problem: Coping: Goal: Level of anxiety will decrease Outcome: Progressing   Problem: Pain Managment: Goal: General experience of comfort will improve Outcome: Progressing

## 2022-12-06 NOTE — Group Note (Signed)
LCSW Group Therapy Note  Group Date: 12/06/2022 Start Time: 1400 End Time: 1445   Type of Therapy and Topic:  Group Therapy - Healthy vs Unhealthy Coping Skills  Participation Level:  Active   Description of Group The focus of this group was to determine what unhealthy coping techniques typically are used by group members and what healthy coping techniques would be helpful in coping with various problems. Patients were guided in becoming aware of the differences between healthy and unhealthy coping techniques. Patients were asked to identify 2-3 healthy coping skills they would like to learn to use more effectively.  Therapeutic Goals Patients learned that coping is what human beings do all day long to deal with various situations in their lives Patients defined and discussed healthy vs unhealthy coping techniques Patients identified their preferred coping techniques and identified whether these were healthy or unhealthy Patients determined 2-3 healthy coping skills they would like to become more familiar with and use more often. Patients provided support and ideas to each other   Summary of Patient Progress:  During group, Betty Henderson expressed adequate knowledge of coping skills and willingness to learn. Patient proved open to input from peers and feedback from CSW. Patient demonstrated good insight into the subject matter, was respectful of peers, and participated throughout the entire session.   Therapeutic Modalities Cognitive Behavioral Therapy Motivational Interviewing  Betty Henderson, [place] 12/06/2022  3:11 PM

## 2022-12-06 NOTE — Progress Notes (Signed)
[facility] MD Progress Note  12/06/2022 1:52 PM Betty Henderson  MRN:  [id number] Subjective: Betty Henderson is seen on rounds.  No changes with her.  Still awaiting to hear from DSS on a discharge plan.  She has been pleasant and cooperative on the unit.  Good controls.  Nurses report no issues. Principal Problem: Schizoaffective disorder, bipolar type (HCC) Diagnosis: Principal Problem:   Schizoaffective disorder, bipolar type (HCC)  Total Time spent with patient: 15 minutes  Past Psychiatric History: Schizoaffective disorder  Past Medical History:  Past Medical History:  Diagnosis Date   Anemia    Arthritis    Chronic pain    Drug-seeking behavior    Malingering    Osteopetrosis    Psychosis (HCC)    Schizoaffective disorder, bipolar type (HCC)     Past Surgical History:  Procedure Laterality Date   MOUTH SURGERY     TUBAL LIGATION     Family History:  Family History  Family history unknown: Yes   Family Psychiatric  History: Unremarkable Social History:  Social History   Substance and Sexual Activity  Alcohol Use Not Currently   Comment: 1 cocktail 3 weeks ago     Social History   Substance and Sexual Activity  Drug Use Not Currently   Types: Cocaine   Comment: states "it's legal though"    Social History   Socioeconomic History   Marital status: Single    Spouse name: Not on file   Number of children: Not on file   Years of education: Not on file   Highest education level: Not on file  Occupational History   Not on file  Tobacco Use   Smoking status: Every Day    Current packs/day: 0.50    Types: Cigarettes   Smokeless tobacco: Never  Vaping Use   Vaping status: Never Used  Substance and Sexual Activity   Alcohol use: Not Currently    Comment: 1 cocktail 3 weeks ago   Drug use: Not Currently    Types: Cocaine    Comment: states "it's legal though"   Sexual activity: Never  Other Topics Concern   Not on file  Social History Narrative   Not on file    Social Determinants of Health   Financial Resource Strain: Not on file  Food Insecurity: Food Insecurity Present (08/22/2022)   Hunger Vital Sign    Worried About Running Out of Food in the Last Year: Sometimes true    Ran Out of Food in the Last Year: Sometimes true  Transportation Needs: Patient Unable To Answer (08/22/2022)   PRAPARE - Transportation    Lack of Transportation (Medical): Patient unable to answer    Lack of Transportation (Non-Medical): Patient unable to answer  Recent Concern: Transportation Needs - Unmet Transportation Needs (06/13/2022)   Received from [facility], [facility]   [facility] - Transportation    Lack of Transportation (Medical): Not on file    Lack of Transportation (Non-Medical): Yes  Physical Activity: Not on file  Stress: Not on file  Social Connections: Unknown (08/06/2021)   Received from [facility], [facility]   Social Network    Social Network: Not on file   Additional Social History:                         Sleep: Good  Appetite:  Good  Current Medications: Current Facility-Administered Medications  Medication Dose Route Frequency Provider Last Rate Last Admin   acetaminophen (  TYLENOL) tablet 650 mg  650 mg Oral Q6H PRN [name], DO   650 mg at 11/27/22 2122   alum & mag hydroxide-simeth (MAALOX/MYLANTA) 200-200-20 MG/5ML suspension 30 mL  30 mL Oral Q4H PRN [name], DO   30 mL at 09/20/22 1142   diphenhydrAMINE (BENADRYL) capsule 50 mg  50 mg Oral Q6H PRN [name], DO   50 mg at 09/29/22 2254   Or   diphenhydrAMINE (BENADRYL) injection 50 mg  50 mg Intramuscular Q6H PRN [name], DO       haloperidol (HALDOL) tablet 5 mg  5 mg Oral Q6H PRN [name], DO   5 mg at 09/28/22 2149   Or   haloperidol lactate (HALDOL) injection 5 mg  5 mg Intramuscular Q6H PRN [name], DO       ibuprofen (ADVIL) tablet 600 mg  600 mg Oral Q6H PRN  [name], [name], MD   600 mg at 12/01/22 2100   LORazepam (ATIVAN) tablet 1 mg  1 mg Oral TID PRN [name], DO   1 mg at 09/27/22 0025   magnesium hydroxide (MILK OF MAGNESIA) suspension 30 mL  30 mL Oral Daily PRN [name], DO   30 mL at 11/08/22 1012   traZODone (DESYREL) tablet 50 mg  50 mg Oral QHS PRN [name], DO   50 mg at 12/05/22 2115    Lab Results: No results found for this or any previous visit (from the past 48 hour(s)).  Blood Alcohol level:  Lab Results  Component Value Date   ETH <10 08/17/2022   ETH <10 01/11/2021    Metabolic Disorder Labs: No results found for: "HGBA1C", "MPG" No results found for: "PROLACTIN" No results found for: "CHOL", "TRIG", "HDL", "CHOLHDL", "VLDL", "LDLCALC"  Physical Findings: AIMS:  , ,  ,  ,    CIWA:    COWS:     Musculoskeletal: Strength & Muscle Tone: within normal limits Gait & Station: normal Patient leans: N/A  Psychiatric Specialty Exam:  Presentation  General Appearance:  Casual; Neat  Eye Contact: Fair  Speech: Slow; Slurred  Speech Volume: Decreased  Handedness: Right   Mood and Affect  Mood: Anxious  Affect: Inappropriate   Thought Process  Thought Processes: Disorganized  Descriptions of Associations:Loose  Orientation:Partial  Thought Content:Illogical; Rumination  History of Schizophrenia/Schizoaffective disorder:No data recorded Duration of Psychotic Symptoms:No data recorded Hallucinations:No data recorded Ideas of Reference:Paranoia  Suicidal Thoughts:No data recorded Homicidal Thoughts:No data recorded  Sensorium  Memory: Immediate Fair; Remote Poor  Judgment: Poor  Insight: Poor   Executive Functions  Concentration: Poor  Attention Span: Fair  Recall: [employer] of Knowledge: Fair  Language: Fair   Psychomotor Activity  Psychomotor Activity:No data recorded  Assets  Assets: Communication  Skills   Sleep  Sleep:No data recorded    Blood pressure (!) 120/90, pulse 78, temperature 97.8 F (36.6 C), resp. rate 16, height 5\' 6"  (1.676 m), weight 64 kg, SpO2 100%. Body mass index is 22.76 kg/m.   Treatment Plan Summary: Daily contact with patient to assess and evaluate symptoms and progress in treatment, Medication management, and Plan continue current medication.  Albi Rappaport Tresea Mall, DO 12/06/2022, 1:52 PM

## 2022-12-06 NOTE — Group Note (Signed)
Date:  12/06/2022 Time:  8:53 PM  Group Topic/Focus:  Healthy Communication:   The focus of this group is to discuss communication, barriers to communication, as well as healthy ways to communicate with others.    Participation Level:  Active  Participation Quality:  Appropriate  Affect:  Appropriate  Cognitive:  Appropriate  Insight: Appropriate  Engagement in Group:  Engaged  Modes of Intervention:  Education  Additional Comments:    [name] 12/06/2022, 8:53 PM

## 2022-12-06 NOTE — Group Note (Signed)
Recreation Therapy Group Note   Group Topic:Coping Skills  Group Date: 12/06/2022 Start Time: 1330 End Time: 1415 Facilitators: Rosina Lowenstein, LRT, CTRS Location: Courtyard  Group Description: Music Reminisce. LRT encouraged patients to think of their favorite song(s) that reminded them of a positive memory or time in their life. LRT encouraged patient to talk about that memory aloud to the group. LRT played the song through a speaker for all to hear. LRT and patients discussed how thinking of a positive memory or time in their life can be used as a coping skill in everyday life post discharge.   Goal Area(s) Addressed: Patient will increase verbal communication by conversing with peers. Patient will contribute to group discussion with minimal prompting. Patient will reminisce a positive memory or moment in their life.    Affect/Mood: Appropriate and Flat   Participation Level: Active and Engaged   Participation Quality: Independent   Behavior: Calm and Cooperative   Speech/Thought Process: Tangential   Insight: Fair   Judgement: Good   Modes of Intervention: Music   Patient Response to Interventions:  Attentive, Engaged, Interested , and Receptive   Education Outcome:  Acknowledges education   Clinical Observations/Individualized Feedback: Betty Henderson was active in their participation of session activities and group discussion. Pt identified "anything by Donn Pierini, I listened to him a lot". Pt then went off to talk about how people were taking her check. Pt interacted well with LRT and peers duration of session.   Plan: Continue to engage patient in RT group sessions 2-3x/week.   Rosina Lowenstein, LRT, CTRS 12/06/2022 2:34 PM

## 2022-12-06 NOTE — Progress Notes (Signed)
   12/06/22 2200  Psych Admission Type (Psych Patients Only)  Admission Status Voluntary  Psychosocial Assessment  Patient Complaints None  Eye Contact Fair  Facial Expression Flat  Affect Appropriate to circumstance  Speech Logical/coherent  Interaction Minimal  Motor Activity Shuffling  Appearance/Hygiene Layered clothes  Behavior Characteristics Cooperative  Mood Pleasant  Thought Process  Coherency WDL  Content WDL  Delusions None reported or observed  Perception WDL  Hallucination None reported or observed  Judgment Limited  Confusion None  Danger to Self  Current suicidal ideation? Denies  Agreement Not to Harm Self Yes  Description of Agreement Verbal  Danger to Others  Danger to Others None reported or observed  Danger to Others Abnormal  Harmful Behavior to others No threats or harm toward other people  Destructive Behavior No threats or harm toward property

## 2022-12-07 DIAGNOSIS — F250 Schizoaffective disorder, bipolar type: Secondary | ICD-10-CM | POA: Diagnosis not present

## 2022-12-07 NOTE — Group Note (Signed)
[facility] LCSW Group Therapy Note   Group Date: 12/07/2022 Start Time: 1320 End Time: 1405   Type of Therapy/Topic:  Group Therapy:  Balance in Life  Participation Level:  Did Not Attend   Description of Group:    This group will address the concept of balance and how it feels and looks when one is unbalanced. Patients will be encouraged to process areas in their lives that are out of balance, and identify reasons for remaining unbalanced. Facilitators will guide patients utilizing problem- solving interventions to address and correct the stressor making their life unbalanced. Understanding and applying boundaries will be explored and addressed for obtaining  and maintaining a balanced life. Patients will be encouraged to explore ways to assertively make their unbalanced needs known to significant others in their lives, using other group members and facilitator for support and feedback.  Therapeutic Goals: Patient will identify two or more emotions or situations they have that consume much of in their lives. Patient will identify signs/triggers that life has become out of balance:  Patient will identify two ways to set boundaries in order to achieve balance in their lives:  Patient will demonstrate ability to communicate their needs through discussion and/or role plays  Summary of Patient Progress:    The patient did not attend      Azucena Kuba, LCSWA

## 2022-12-07 NOTE — Progress Notes (Signed)
   12/07/22 2100  Psych Admission Type (Psych Patients Only)  Admission Status Voluntary  Psychosocial Assessment  Patient Complaints None  Eye Contact Fair  Facial Expression Flat  Affect Appropriate to circumstance  Speech Logical/coherent  Interaction Minimal  Motor Activity Shuffling  Appearance/Hygiene Layered clothes  Behavior Characteristics Cooperative  Mood Pleasant  Thought Process  Coherency WDL  Content WDL  Delusions None reported or observed  Perception WDL  Hallucination None reported or observed  Judgment Limited  Confusion None  Danger to Self  Current suicidal ideation? Denies  Agreement Not to Harm Self Yes  Description of Agreement Verbal  Danger to Others  Danger to Others None reported or observed  Danger to Others Abnormal  Harmful Behavior to others No threats or harm toward other people  Destructive Behavior No threats or harm toward property

## 2022-12-07 NOTE — Progress Notes (Signed)
[facility] MD Progress Note  12/07/2022 12:19 PM Betty Henderson  MRN:  [id number] Subjective: Betty Henderson was seen on rounds.  She has no complaints.  She is aware that her guardian and social work are looking for places for her to live.  She has no complaints.  She is pleasant and cooperative on the unit.  She has been compliant with her medications. Principal Problem: Schizoaffective disorder, bipolar type (HCC) Diagnosis: Principal Problem:   Schizoaffective disorder, bipolar type (HCC)  Total Time spent with patient: 15 minutes  Past Psychiatric History: Schizoaffective disorder  Past Medical History:  Past Medical History:  Diagnosis Date   Anemia    Arthritis    Chronic pain    Drug-seeking behavior    Malingering    Osteopetrosis    Psychosis (HCC)    Schizoaffective disorder, bipolar type (HCC)     Past Surgical History:  Procedure Laterality Date   MOUTH SURGERY     TUBAL LIGATION     Family History:  Family History  Family history unknown: Yes   Family Psychiatric  History: Unremarkable Social History:  Social History   Substance and Sexual Activity  Alcohol Use Not Currently   Comment: 1 cocktail 3 weeks ago     Social History   Substance and Sexual Activity  Drug Use Not Currently   Types: Cocaine   Comment: states "it's legal though"    Social History   Socioeconomic History   Marital status: Single    Spouse name: Not on file   Number of children: Not on file   Years of education: Not on file   Highest education level: Not on file  Occupational History   Not on file  Tobacco Use   Smoking status: Every Day    Current packs/day: 0.50    Types: Cigarettes   Smokeless tobacco: Never  Vaping Use   Vaping status: Never Used  Substance and Sexual Activity   Alcohol use: Not Currently    Comment: 1 cocktail 3 weeks ago   Drug use: Not Currently    Types: Cocaine    Comment: states "it's legal though"   Sexual activity: Never  Other Topics Concern    Not on file  Social History Narrative   Not on file   Social Determinants of Health   Financial Resource Strain: Not on file  Food Insecurity: Food Insecurity Present (08/22/2022)   Hunger Vital Sign    Worried About Running Out of Food in the Last Year: Sometimes true    Ran Out of Food in the Last Year: Sometimes true  Transportation Needs: Patient Unable To Answer (08/22/2022)   PRAPARE - Transportation    Lack of Transportation (Medical): Patient unable to answer    Lack of Transportation (Non-Medical): Patient unable to answer  Recent Concern: Transportation Needs - Unmet Transportation Needs (06/13/2022)   [facility]   [facility] - Transportation    Lack of Transportation (Medical): Not on file    Lack of Transportation (Non-Medical): Yes  Physical Activity: Not on file  Stress: Not on file  Social Connections: Unknown (08/06/2021)   Received from [facility], [facility]   Social Network    Social Network: Not on file   Additional Social History:                         Sleep: Good  Appetite:  Good  Current Medications: Current Facility-Administered Medications  Medication  Dose Route Frequency Provider Last Rate Last Admin   acetaminophen (TYLENOL) tablet 650 mg  650 mg Oral Q6H PRN [name], DO   650 mg at 11/27/22 2122   alum & mag hydroxide-simeth (MAALOX/MYLANTA) 200-200-20 MG/5ML suspension 30 mL  30 mL Oral Q4H PRN [name], DO   30 mL at 09/20/22 1142   diphenhydrAMINE (BENADRYL) capsule 50 mg  50 mg Oral Q6H PRN [name], DO   50 mg at 09/29/22 2254   Or   diphenhydrAMINE (BENADRYL) injection 50 mg  50 mg Intramuscular Q6H PRN [name], DO       haloperidol (HALDOL) tablet 5 mg  5 mg Oral Q6H PRN [name], DO   5 mg at 09/28/22 2149   Or   haloperidol lactate (HALDOL) injection 5 mg  5 mg Intramuscular Q6H PRN [name], DO        ibuprofen (ADVIL) tablet 600 mg  600 mg Oral Q6H PRN [name], [name], MD   600 mg at 12/01/22 2100   LORazepam (ATIVAN) tablet 1 mg  1 mg Oral TID PRN [name], DO   1 mg at 09/27/22 0025   magnesium hydroxide (MILK OF MAGNESIA) suspension 30 mL  30 mL Oral Daily PRN [name], DO   30 mL at 11/08/22 1012   traZODone (DESYREL) tablet 50 mg  50 mg Oral QHS PRN [name], DO   50 mg at 12/06/22 2110    Lab Results: No results found for this or any previous visit (from the past 48 hour(s)).  Blood Alcohol level:  Lab Results  Component Value Date   ETH <10 08/17/2022   ETH <10 01/11/2021    Metabolic Disorder Labs: No results found for: "HGBA1C", "MPG" No results found for: "PROLACTIN" No results found for: "CHOL", "TRIG", "HDL", "CHOLHDL", "VLDL", "LDLCALC"  Physical Findings: AIMS:  , ,  ,  ,    CIWA:    COWS:     Musculoskeletal: Strength & Muscle Tone: within normal limits Gait & Station: normal Patient leans: N/A  Psychiatric Specialty Exam:  Presentation  General Appearance:  Casual; Neat  Eye Contact: Fair  Speech: Slow; Slurred  Speech Volume: Decreased  Handedness: Right   Mood and Affect  Mood: Anxious  Affect: Inappropriate   Thought Process  Thought Processes: Disorganized  Descriptions of Associations:Loose  Orientation:Partial  Thought Content:Illogical; Rumination  History of Schizophrenia/Schizoaffective disorder:No data recorded Duration of Psychotic Symptoms:No data recorded Hallucinations:No data recorded Ideas of Reference:Paranoia  Suicidal Thoughts:No data recorded Homicidal Thoughts:No data recorded  Sensorium  Memory: Immediate Fair; Remote Poor  Judgment: Poor  Insight: Poor   Executive Functions  Concentration: Poor  Attention Span: Fair  Recall: [employer] of Knowledge: Fair  Language: Fair   Psychomotor Activity  Psychomotor Activity:No data  recorded  Assets  Assets: Communication Skills   Sleep  Sleep:No data recorded    Blood pressure (!) 103/48, pulse 74, temperature 97.9 F (36.6 C), resp. rate 16, height 5\' 6"  (1.676 m), weight 64 kg, SpO2 97%. Body mass index is 22.76 kg/m.   Treatment Plan Summary: Daily contact with patient to assess and evaluate symptoms and progress in treatment, Medication management, and Plan continue current medications.  [name], DO 12/07/2022, 12:19 PM

## 2022-12-07 NOTE — Group Note (Signed)
Date:  12/07/2022 Time:  5:57 PM  Group Topic/Focus:  Goals Group:   The focus of this group is to help patients establish daily goals to achieve during treatment and discuss how the patient can incorporate goal setting into their daily lives to aide in recovery.    Participation Level:  Active  Participation Quality:  Appropriate and Attentive  Affect:  Appropriate  Cognitive:  Alert, Appropriate, and Oriented  Insight: Appropriate  Engagement in Group:  Developing/Improving and Engaged  Modes of Intervention:  Activity, Discussion, and Education  Additional Comments:    [name] 12/07/2022, 5:57 PM

## 2022-12-07 NOTE — Group Note (Signed)
Date:  12/07/2022 Time:  12:35 PM  Group Topic/Focus:  Outdoor recreation. Sales executive.    Participation Level:  Active  Participation Quality:  Appropriate, Attentive, Sharing, and Supportive  Affect:  Appropriate  Cognitive:  Alert, Appropriate, and Oriented  Insight: Appropriate  Engagement in Group:  Developing/Improving and Engaged  Modes of Intervention:  Activity, Discussion, Rapport Building, and Socialization  Additional Comments:    [name] 12/07/2022, 12:35 PM

## 2022-12-07 NOTE — Progress Notes (Signed)
   12/07/22 0558  15 Minute Checks  Location Bedroom  Visual Appearance Calm  Behavior Sleeping  Sleep (Behavioral Health Patients Only)  Calculate sleep? (Click Yes once per 24 hr at 0600 safety check) Yes  Documented sleep last 24 hours 11.25

## 2022-12-07 NOTE — BH IP Treatment Plan (Signed)
Interdisciplinary Treatment and Diagnostic Plan Update  12/07/2022 Time of Session: 2:02 pm Betty Henderson MRN: [id number]  Principal Diagnosis: Schizoaffective disorder, bipolar type [facility])  Secondary Diagnoses: Principal Problem:   Schizoaffective disorder, bipolar type (HCC)   Current Medications:  Current Facility-Administered Medications  Medication Dose Route Frequency Provider Last Rate Last Admin   acetaminophen (TYLENOL) tablet 650 mg  650 mg Oral Q6H PRN [name], DO   650 mg at 11/27/22 2122   alum & mag hydroxide-simeth (MAALOX/MYLANTA) 200-200-20 MG/5ML suspension 30 mL  30 mL Oral Q4H PRN [name], DO   30 mL at 09/20/22 1142   diphenhydrAMINE (BENADRYL) capsule 50 mg  50 mg Oral Q6H PRN [name], DO   50 mg at 09/29/22 2254   Or   diphenhydrAMINE (BENADRYL) injection 50 mg  50 mg Intramuscular Q6H PRN [name], DO       haloperidol (HALDOL) tablet 5 mg  5 mg Oral Q6H PRN [name], DO   5 mg at 09/28/22 2149   Or   haloperidol lactate (HALDOL) injection 5 mg  5 mg Intramuscular Q6H PRN [name], DO       ibuprofen (ADVIL) tablet 600 mg  600 mg Oral Q6H PRN [name], [name], MD   600 mg at 12/01/22 2100   LORazepam (ATIVAN) tablet 1 mg  1 mg Oral TID PRN [name], DO   1 mg at 09/27/22 0025   magnesium hydroxide (MILK OF MAGNESIA) suspension 30 mL  30 mL Oral Daily PRN [name], DO   30 mL at 11/08/22 1012   traZODone (DESYREL) tablet 50 mg  50 mg Oral QHS PRN [name], DO   50 mg at 12/06/22 2110   PTA Medications: Medications Prior to Admission  Medication Sig Dispense Refill Last Dose   ondansetron (ZOFRAN) 4 MG tablet Take 1 tablet (4 mg total) by mouth every 8 (eight) hours as needed for nausea or vomiting. (Patient not taking: Reported on 08/17/2022) 20 tablet 0     Patient Stressors: Financial difficulties   Health problems    Medication change or noncompliance    Patient Strengths: Ability for insight   Treatment Modalities: Medication Management, Group therapy, Case management,  1 to 1 session with clinician, Psychoeducation, Recreational therapy.   Physician Treatment Plan for Primary Diagnosis: Schizoaffective disorder, bipolar type (HCC) Long Term Goal(s): Improvement in symptoms so as ready for discharge   Short Term Goals: Ability to identify changes in lifestyle to reduce recurrence of condition will improve Ability to verbalize feelings will improve Ability to disclose and discuss suicidal ideas Ability to demonstrate self-control will improve Ability to identify and develop effective coping behaviors will improve Ability to maintain clinical measurements within normal limits will improve Compliance with prescribed medications will improve Ability to identify triggers associated with substance abuse/mental health issues will improve  Medication Management: Evaluate patient's response, side effects, and tolerance of medication regimen.  Therapeutic Interventions: 1 to 1 sessions, Unit Group sessions and Medication administration.  Evaluation of Outcomes: Progressing  Physician Treatment Plan for Secondary Diagnosis: Principal Problem:   Schizoaffective disorder, bipolar type (HCC)  Long Term Goal(s): Improvement in symptoms so as ready for discharge   Short Term Goals: Ability to identify changes in lifestyle to reduce recurrence of condition will improve Ability to verbalize feelings will improve Ability to disclose and discuss suicidal ideas Ability to demonstrate self-control will improve Ability to identify and  develop effective coping behaviors will improve Ability to maintain clinical measurements within normal limits will improve Compliance with prescribed medications will improve Ability to identify triggers associated with substance abuse/mental health issues will improve      Medication Management: Evaluate patient's response, side effects, and tolerance of medication regimen.  Therapeutic Interventions: 1 to 1 sessions, Unit Group sessions and Medication administration.  Evaluation of Outcomes: Progressing   RN Treatment Plan for Primary Diagnosis: Schizoaffective disorder, bipolar type (HCC) Long Term Goal(s): Knowledge of disease and therapeutic regimen to maintain health will improve  Short Term Goals: Ability to remain free from injury will improve, Ability to verbalize frustration and anger appropriately will improve, Ability to demonstrate self-control, Ability to participate in decision making will improve, Ability to verbalize feelings will improve, Ability to disclose and discuss suicidal ideas, Ability to identify and develop effective coping behaviors will improve, and Compliance with prescribed medications will improve  Medication Management: RN will administer medications as ordered by provider, will assess and evaluate patient's response and provide education to patient for prescribed medication. RN will report any adverse and/or side effects to prescribing provider.  Therapeutic Interventions: 1 on 1 counseling sessions, Psychoeducation, Medication administration, Evaluate responses to treatment, Monitor vital signs and CBGs as ordered, Perform/monitor CIWA, COWS, AIMS and Fall Risk screenings as ordered, Perform wound care treatments as ordered.  Evaluation of Outcomes: Progressing   LCSW Treatment Plan for Primary Diagnosis: Schizoaffective disorder, bipolar type (HCC) Long Term Goal(s): Safe transition to appropriate next level of care at discharge, Engage patient in therapeutic group addressing interpersonal concerns.  Short Term Goals: Engage patient in aftercare planning with referrals and resources, Increase social support, Increase ability to appropriately verbalize feelings, Increase emotional regulation, Facilitate acceptance of mental  health diagnosis and concerns, Facilitate patient progression through stages of change regarding substance use diagnoses and concerns, Identify triggers associated with mental health/substance abuse issues, and Increase skills for wellness and recovery  Therapeutic Interventions: Assess for all discharge needs, 1 to 1 time with Social worker, Explore available resources and support systems, Assess for adequacy in community support network, Educate family and significant other(s) on suicide prevention, Complete Psychosocial Assessment, Interpersonal group therapy.  Evaluation of Outcomes: Progressing   Progress in Treatment: Attending groups: Yes. Participating in groups: Yes. Taking medication as prescribed: Yes. Toleration medication: Yes. Family/Significant other contact made: Yes, individual(s) contacted:  Dayelin Fagley, son, [phone]  Patient understands diagnosis: Yes. Discussing patient identified problems/goals with staff: Yes. Medical problems stabilized or resolved: Yes. Denies suicidal/homicidal ideation: Yes. Issues/concerns per patient self-inventory: No. Other: none  New problem(s) identified: No, Describe:  none  New Short Term/Long Term Goal(s): Update 6/30: none at this time. Update 09/27/2022:  No changes at this time.  Update 10/02/2022:  No changes at this time. Update 10/07/22: No changes at this time 10/12/22: No changes at this time Update 10/18/22: No changes at this time Update 10/23/22: No changes at this time Update 10/28/22: No changes at this time Update 11/07/22: No changes at this time Update 11/12/22: No changes at this time  Update 11/17/22: None at this time. Update 11/22/22: None at this time. Update 11/27/22 No changes at this time. Update 12/02/22 No changes at this time  Update 12/07/22: None at this time.    Patient Goals:  Update 6/30: none at this time. Update 09/27/2022:  No changes at this time. Update 10/02/2022:  No changes at this time. Update 10/07/22: No  changes at this time 10/12/22: No changes  at this time Update 10/18/22: No changes at this time Update 10/23/22: No changes at this time Update 10/28/22: No changes at this time Update 11/07/22: No changes at this time Update 11/12/22: No changes at this time Update 11/17/22: None at this time. Update 11/22/22: None at this time. Update 11/27/22 No changes at this time Update 12/02/22 No changes at this time   Update 12/07/22: None at this time.      Discharge Plan or Barriers: Update 6/30: APS report has been made and patient is being investigated for guardianship needs.  Remains homeless with limited supports. Update 09/27/2022:  No changes at this time.  Update 10/02/2022:  Patient remains safe on the unit at this time.  Patient remains psychotic at this time.  APS report has been made, however, no follow up from the caseworker on her case.  No safe discharge identified.  CSW has requested that application for Medicaid and disability be completed.   Update 10/07/22: No changes at this time 10/12/22: No changes at this time Update 10/18/22: No changes at this time Update 10/23/22: No changes at this time Update 10/23/22: No changes at this time Update 10/28/22: According to [facility], pt's caseworker at DSS, the petition was filed for guardianship on 10/21/22, now awaiting for the petition to be approvedUpdate 11/07/22: No changes at this time Update 11/12/22: CSW sent over patients information to [facility] and [facility], awaiting a response. CSW awaiting word from DSS regarding pt's petition and what agency will become her guardian. Update 11/17/22: No changes at this time. Update 11/22/22: CSW has sent patients information to multiple facilities that were suggested by leadership, pt has been denied from the facility or the facility does not respond. CSW continues to send pt's information to nursing homes. Update 11/27/22 CSW contacted DSS to inquire about the status of guardianship. No response at this time Update 12/02/22  Supervisor Loraine Leriche contacted DSS who stated they are not taking pt on for guardianship but that Quincy Carnes will be present tomorrow to do a visit with social worker and pt and that pt's son will be signing documentation for placement for the pt. Update 12/07/22: [facility] DSS has declined to take guardianship. Son, Casimiro Needle is to step into a more present role in deciding placement. A conversation is still needed.   Reason for Continuation of Hospitalization: Hallucinations Mania Medication stabilization   Estimated Length of Stay:  Update 6/30: 1-7 days Update 09/27/2022:  TBD Update 10/02/2022:  No changes at this time. [place] 10/18/22: No changes at this time te 10/07/22: No changes at this time Update 10/18/22: No changes at this time Update 10/23/22: No changes at this time Update 10/28/22: No changes at this time Update 11/07/22: No changes at this time Update 11/12/22: No changes at this time  Update 11/17/22: No changes at this time. Update 11/22/22: None at this time. Update 11/27/22 No changes at this time. Update 12/02/22 No changes at this time  Update 12/07/22: None at this time.   Last 3 [place] Suicide Severity Risk Score: Flowsheet Row Admission (Current) from 08/22/2022 in [facility] ED from 08/17/2022 in [facility] Emergency Department at [facility] ED from 08/12/2022 in [facility] Emergency Department at [facility]  C-SSRS RISK CATEGORY Low Risk No Risk No Risk       Last [facility] 2/9 Scores:     No data to display          Scribe for Treatment  Team: Marshell Levan, LCSW 12/07/2022 2:02 PM

## 2022-12-07 NOTE — Plan of Care (Signed)
D: Pt alert and oriented. Pt denies experiencing any anxiety/depression at this time. Pt denies experiencing any pain at this time. Pt reports experiencing any SI/HI, or AVH at this time.   A:  Support and encouragement provided. Frequent verbal contact made. Routine safety checks conducted q15 minutes.   R:  Pt verbally contracts for safety at this time. Pt compliant with treatment plan. Pt interacts well with others on the unit. Pt remains safe at this time. Plan of care ongoing.  Problem: Nutrition: Goal: Adequate nutrition will be maintained Outcome: Progressing   Problem: Coping: Goal: Level of anxiety will decrease Outcome: Progressing

## 2022-12-08 DIAGNOSIS — F250 Schizoaffective disorder, bipolar type: Secondary | ICD-10-CM | POA: Diagnosis not present

## 2022-12-08 NOTE — Plan of Care (Signed)
Problem: Education: Goal: Knowledge of General Education information will improve Description Including pain rating scale, medication(s)/side effects and non-pharmacologic comfort measures Outcome: Progressing   Problem: Nutrition: Goal: Adequate nutrition will be maintained Outcome: Progressing   Problem: Coping: Goal: Level of anxiety will decrease Outcome: Progressing   Problem: Skin Integrity: Goal: Risk for impaired skin integrity will decrease Outcome: Progressing

## 2022-12-08 NOTE — Progress Notes (Signed)
[facility] MD Progress Note  12/08/2022 11:28 AM Betty Henderson  MRN:  [id number] Subjective: Betty Henderson is seen on rounds.  She has no complaints.  She has been compliant with medications.  Nurses report no issues.  Social work continues to work with DSS to find placement for her.  Not sure what is taking so long because she is pleasant and cooperative the whole time she has been here. Principal Problem: Schizoaffective disorder, bipolar type (HCC) Diagnosis: Principal Problem:   Schizoaffective disorder, bipolar type (HCC)  Total Time spent with patient: 15 minutes  Past Psychiatric History: Long history of schizoaffective disorder  Past Medical History:  Past Medical History:  Diagnosis Date   Anemia    Arthritis    Chronic pain    Drug-seeking behavior    Malingering    Osteopetrosis    Psychosis (HCC)    Schizoaffective disorder, bipolar type (HCC)     Past Surgical History:  Procedure Laterality Date   MOUTH SURGERY     TUBAL LIGATION     Family History:  Family History  Family history unknown: Yes   Family Psychiatric  History: Unremarkable Social History:  Social History   Substance and Sexual Activity  Alcohol Use Not Currently   Comment: 1 cocktail 3 weeks ago     Social History   Substance and Sexual Activity  Drug Use Not Currently   Types: Cocaine   Comment: states "it's legal though"    Social History   Socioeconomic History   Marital status: Single    Spouse name: Not on file   Number of children: Not on file   Years of education: Not on file   Highest education level: Not on file  Occupational History   Not on file  Tobacco Use   Smoking status: Every Day    Current packs/day: 0.50    Types: Cigarettes   Smokeless tobacco: Never  Vaping Use   Vaping status: Never Used  Substance and Sexual Activity   Alcohol use: Not Currently    Comment: 1 cocktail 3 weeks ago   Drug use: Not Currently    Types: Cocaine    Comment: states "it's legal  though"   Sexual activity: Never  Other Topics Concern   Not on file  Social History Narrative   Not on file   Social Determinants of Health   Financial Resource Strain: Not on file  Food Insecurity: Food Insecurity Present (08/22/2022)   Hunger Vital Sign    Worried About Running Out of Food in the Last Year: Sometimes true    Ran Out of Food in the Last Year: Sometimes true  Transportation Needs: Patient Unable To Answer (08/22/2022)   PRAPARE - Transportation    Lack of Transportation (Medical): Patient unable to answer    Lack of Transportation (Non-Medical): Patient unable to answer  Recent Concern: Transportation Needs - Unmet Transportation Needs (06/13/2022)   Received from [facility], [facility]   [facility] - Transportation    Lack of Transportation (Medical): Not on file    Lack of Transportation (Non-Medical): Yes  Physical Activity: Not on file  Stress: Not on file  Social Connections: Unknown (08/06/2021)   Received from [facility], [facility]   Social Network    Social Network: Not on file   Additional Social History:                         Sleep: Good  Appetite:  Good  Current Medications: Current Facility-Administered Medications  Medication Dose Route Frequency Provider Last Rate Last Admin   acetaminophen (TYLENOL) tablet 650 mg  650 mg Oral Q6H PRN [name], DO   650 mg at 11/27/22 2122   alum & mag hydroxide-simeth (MAALOX/MYLANTA) 200-200-20 MG/5ML suspension 30 mL  30 mL Oral Q4H PRN [name], DO   30 mL at 09/20/22 1142   diphenhydrAMINE (BENADRYL) capsule 50 mg  50 mg Oral Q6H PRN [name], DO   50 mg at 09/29/22 2254   Or   diphenhydrAMINE (BENADRYL) injection 50 mg  50 mg Intramuscular Q6H PRN [name], DO       haloperidol (HALDOL) tablet 5 mg  5 mg Oral Q6H PRN [name], DO   5 mg at 09/28/22 2149   Or   haloperidol lactate (HALDOL) injection 5 mg  5  mg Intramuscular Q6H PRN [name], DO       ibuprofen (ADVIL) tablet 600 mg  600 mg Oral Q6H PRN [name], [name], MD   600 mg at 12/01/22 2100   LORazepam (ATIVAN) tablet 1 mg  1 mg Oral TID PRN [name], DO   1 mg at 09/27/22 0025   magnesium hydroxide (MILK OF MAGNESIA) suspension 30 mL  30 mL Oral Daily PRN [name], DO   30 mL at 11/08/22 1012   traZODone (DESYREL) tablet 50 mg  50 mg Oral QHS PRN [name], DO   50 mg at 12/07/22 2130    Lab Results: No results found for this or any previous visit (from the past 48 hour(s)).  Blood Alcohol level:  Lab Results  Component Value Date   ETH <10 08/17/2022   ETH <10 01/11/2021    Metabolic Disorder Labs: No results found for: "HGBA1C", "MPG" No results found for: "PROLACTIN" No results found for: "CHOL", "TRIG", "HDL", "CHOLHDL", "VLDL", "LDLCALC"  Physical Findings: AIMS:  , ,  ,  ,    CIWA:    COWS:     Musculoskeletal: Strength & Muscle Tone: within normal limits Gait & Station: normal Patient leans: N/A  Psychiatric Specialty Exam:  Presentation  General Appearance:  Casual; Neat  Eye Contact: Fair  Speech: Slow; Slurred  Speech Volume: Decreased  Handedness: Right   Mood and Affect  Mood: Anxious  Affect: Inappropriate   Thought Process  Thought Processes: Disorganized  Descriptions of Associations:Loose  Orientation:Partial  Thought Content:Illogical; Rumination  History of Schizophrenia/Schizoaffective disorder:No data recorded Duration of Psychotic Symptoms:No data recorded Hallucinations:No data recorded Ideas of Reference:Paranoia  Suicidal Thoughts:No data recorded Homicidal Thoughts:No data recorded  Sensorium  Memory: Immediate Fair; Remote Poor  Judgment: Poor  Insight: Poor   Executive Functions  Concentration: Poor  Attention Span: Fair  Recall: [employer] of  Knowledge: Fair  Language: Fair   Psychomotor Activity  Psychomotor Activity:No data recorded  Assets  Assets: Communication Skills   Sleep  Sleep:No data recorded   Physical Exam: Physical Exam Vitals and nursing note reviewed.  Constitutional:      Appearance: Normal appearance. She is normal weight.  Neurological:     General: No focal deficit present.     Mental Status: She is alert and oriented to person, place, and time.  Psychiatric:        Attention and Perception: Attention and perception normal.        Mood and Affect: Mood and affect normal.  Speech: Speech is tangential.        Behavior: Behavior normal. Behavior is cooperative.        Thought Content: Thought content normal.        Cognition and Memory: Memory normal. Cognition is impaired.        Judgment: Judgment is inappropriate.    Review of Systems  Constitutional: Negative.   HENT: Negative.    Eyes: Negative.   Respiratory: Negative.    Cardiovascular: Negative.   Gastrointestinal: Negative.   Genitourinary: Negative.   Musculoskeletal: Negative.   Skin: Negative.   Neurological: Negative.   Endo/Heme/Allergies: Negative.   Psychiatric/Behavioral: Negative.     Blood pressure 115/78, pulse 84, temperature 97.9 F (36.6 C), resp. rate 16, height 5\' 6"  (1.676 m), weight 64 kg, SpO2 98%. Body mass index is 22.76 kg/m.   Treatment Plan Summary: Daily contact with patient to assess and evaluate symptoms and progress in treatment, Medication management, and Plan continue current medications.  Breck [place] Tresea Mall, DO 12/08/2022, 11:28 AM

## 2022-12-08 NOTE — Progress Notes (Signed)
Patient present in the dayroom for breakfast.  Pleasant and cooperative.  Flat affect.  Denies SI/HI.  Not observed responding to internal stimuli. Pain 8/10 in legs.    No scheduled medications.  15 min checks in place for safety.  Returned to room after breakfast.  Minimal interaction with peers and staff.    PRN pain medication given x 1.

## 2022-12-08 NOTE — Progress Notes (Signed)
   12/08/22 1920  Psych Admission Type (Psych Patients Only)  Admission Status Voluntary  Psychosocial Assessment  Patient Complaints None  Eye Contact Fair  Facial Expression Animated  Affect Appropriate to circumstance  Speech Logical/coherent  Interaction Assertive  Motor Activity Slow  Appearance/Hygiene Layered clothes  Behavior Characteristics Cooperative;Appropriate to situation  Mood Pleasant  Thought Process  Coherency WDL  Content WDL  Delusions None reported or observed  Perception WDL  Hallucination None reported or observed  Judgment Impaired  Confusion None  Danger to Self  Current suicidal ideation? Denies  Danger to Others  Danger to Others None reported or observed   Progress note   D: Pt seen at nurse's station. Pt denies SI, HI, AVH. Pt rates pain  0/10. Stated she had Tylenol earlier and that helped the pain she was having at the time. Pt rates anxiety  0/10 and depression  0/10. Pt states that she wants to go home. Says they are trying to find her somewhere to go. Says she would like to go back to [facility], where her brothers live. Endorses good appetite and sleep. Pt spent more time in dayroom today than usual so she wouldn't take naps during the day and sleep better at night. No other concerns noted at this time.  A: Pt provided support and encouragement. Pt given scheduled medication as prescribed. PRNs as appropriate. Q15 min checks for safety.   R: Pt safe on the unit. Will continue to monitor.

## 2022-12-08 NOTE — Group Note (Signed)
Date:  12/08/2022 Time:  11:01 AM  Group Topic/Focus:  Outside Rec/Music Therapy This group allows paitents to get out and get fresh air while listening to their favorite music   Participation Level:  Active  Participation Quality:  Appropriate  Affect:  Appropriate  Cognitive:  Alert and Appropriate  Insight: Appropriate  Engagement in Group:  Engaged  Modes of Intervention:  Activity and Discussion  Additional Comments:    [name] 12/08/2022, 11:01 AM

## 2022-12-08 NOTE — Progress Notes (Signed)
   12/08/22 9562  15 Minute Checks  Location Bedroom  Visual Appearance Calm  Behavior Sleeping  Sleep (Behavioral Health Patients Only)  Calculate sleep? (Click Yes once per 24 hr at 0600 safety check) Yes  Documented sleep last 24 hours 13.5

## 2022-12-08 NOTE — Group Note (Signed)
Date:  12/08/2022 Time:  12:06 AM  Group Topic/Focus:  Overcoming Stress:   The focus of this group is to define stress and help patients assess their triggers.    Participation Level:  Active  Participation Quality:  Appropriate  Affect:  Appropriate  Cognitive:  Appropriate  Insight: Appropriate  Engagement in Group:  Engaged  Modes of Intervention:  Exploration  Additional Comments:    [name] 12/08/2022, 12:06 AM

## 2022-12-08 NOTE — Group Note (Signed)
Date:  12/08/2022 Time:  11:52 PM  Group Topic/Focus:  Making Healthy Choices:   The focus of this group is to help patients identify negative/unhealthy choices they were using prior to admission and identify positive/healthier coping strategies to replace them upon discharge.    Participation Level:  Active  Participation Quality:  Appropriate  Affect:  Appropriate  Cognitive:  Appropriate  Insight: Improving  Engagement in Group:  Engaged  Modes of Intervention:  Discussion  Additional Comments:    [name] 12/08/2022, 11:52 PM

## 2022-12-09 DIAGNOSIS — F250 Schizoaffective disorder, bipolar type: Secondary | ICD-10-CM | POA: Diagnosis not present

## 2022-12-09 NOTE — Group Note (Unsigned)
Date:  12/10/2022 Time:  12:05 AM  Group Topic/Focus:  Building Self Esteem:   The Focus of this group is helping patients become aware of the effects of self-esteem on their lives, the things they and others do that enhance or undermine their self-esteem, seeing the relationship between their level of self-esteem and the choices they make and learning ways to enhance self-esteem.    Participation Level:  Did Not Attend  Participation Quality:      Affect:      Cognitive:      Insight: None  Engagement in Group:  None  Modes of Intervention:      Additional Comments:    [name] 12/10/2022, 12:05 AM

## 2022-12-09 NOTE — Plan of Care (Signed)
Problem: Education: Goal: Knowledge of General Education information will improve Description Including pain rating scale, medication(s)/side effects and non-pharmacologic comfort measures Outcome: Progressing   Problem: Health Behavior/Discharge Planning: Goal: Ability to manage health-related needs will improve Outcome: Progressing   Problem: Clinical Measurements: Goal: Ability to maintain clinical measurements within normal limits will improve Outcome: Progressing Goal: Diagnostic test results will improve Outcome: Progressing   Problem: Nutrition: Goal: Adequate nutrition will be maintained Outcome: Progressing   Problem: Coping: Goal: Level of anxiety will decrease Outcome: Progressing   Problem: Pain Managment: Goal: General experience of comfort will improve Outcome: Progressing   Problem: Safety: Goal: Ability to remain free from injury will improve Outcome: Progressing   Problem: Skin Integrity: Goal: Risk for impaired skin integrity will decrease Outcome: Progressing

## 2022-12-09 NOTE — Group Note (Signed)
Recreation Therapy Group Note   Group Topic:Goal Setting  Group Date: 12/09/2022 Start Time: 1400 End Time: 1500 Facilitators: Rosina Lowenstein, LRT, CTRS Location:  Day room  Group Description: Vision Board. Patients were given many different magazines, a glue stick, markers, and a piece of cardstock paper. LRT and pts discussed the importance of having goals in life. LRT and pts discussed the difference between short-term and long-term goals, as well as what a SMART goal is. LRT encouraged pts to create a vision board, with images they picked and then cut out with safety scissors from the magazine, for themselves, that capture their short and long-term goals. LRT encouraged pts to show and explain their vision board to the group.   Goal Area(s) Addressed:  Patient will gain knowledge of short vs. long term goals.  Patient will identify goals for themselves. Patient will practice setting SMART goals. Patient will verbalize their goals to LRT and peers.   Affect/Mood: Appropriate   Participation Level: Active and Engaged   Participation Quality: Independent   Behavior: Calm and Cooperative   Speech/Thought Process: Coherent   Insight: Fair   Judgement: Good   Modes of Intervention: Art and Activity   Patient Response to Interventions:  Attentive, Engaged, Interested , and Receptive   Education Outcome:  Acknowledges education   Clinical Observations/Individualized Feedback: Betty Henderson was active in their participation of session activities and group discussion. Pt identified "see lots of animals" as a goal. Pt appropriately identified images to reflect this goal. Pt interacted well with LRT and peers duration of session.   Plan: Continue to engage patient in RT group sessions 2-3x/week.   Rosina Lowenstein, LRT, CTRS 12/09/2022 3:15 PM

## 2022-12-09 NOTE — BHH Counselor (Signed)
CSW sent FL2 to Quincy Carnes per her request  Reynaldo Minium, MSW, [facility] 12/09/2022 2:19 PM

## 2022-12-09 NOTE — Progress Notes (Signed)
[facility] MD Progress Note  12/09/2022 12:58 PM Betty Henderson  MRN:  [id number] Subjective: Betty Henderson is seen on rounds.  She has been in good controls.  She has been compliant with her medication.  Social work continues to work with DSS to find placement for [employer].  [facility] has been very patient to and has no complaints.  Nurses report no issues.  There is still no plan that social work knows of as far as discharge.  She still has her delusional episodes if he talked to her long enough. Principal Problem: Schizoaffective disorder, bipolar type (HCC) Diagnosis: Principal Problem:   Schizoaffective disorder, bipolar type (HCC)  Total Time spent with patient: 15 minutes  Past Psychiatric History: Schizoaffective disorder bipolar type  Past Medical History:  Past Medical History:  Diagnosis Date   Anemia    Arthritis    Chronic pain    Drug-seeking behavior    Malingering    Osteopetrosis    Psychosis (HCC)    Schizoaffective disorder, bipolar type (HCC)     Past Surgical History:  Procedure Laterality Date   MOUTH SURGERY     TUBAL LIGATION     Family History:  Family History  Family history unknown: Yes   Family Psychiatric  History: Unremarkable Social History:  Social History   Substance and Sexual Activity  Alcohol Use Not Currently   Comment: 1 cocktail 3 weeks ago     Social History   Substance and Sexual Activity  Drug Use Not Currently   Types: Cocaine   Comment: states "it's legal though"    Social History   Socioeconomic History   Marital status: Single    Spouse name: Not on file   Number of children: Not on file   Years of education: Not on file   Highest education level: Not on file  Occupational History   Not on file  Tobacco Use   Smoking status: Every Day    Current packs/day: 0.50    Types: Cigarettes   Smokeless tobacco: Never  Vaping Use   Vaping status: Never Used  Substance and Sexual Activity   Alcohol use: Not Currently    Comment: 1  cocktail 3 weeks ago   Drug use: Not Currently    Types: Cocaine    Comment: states "it's legal though"   Sexual activity: Never  Other Topics Concern   Not on file  Social History Narrative   Not on file   Social Determinants of Health   Financial Resource Strain: Not on file  Food Insecurity: Food Insecurity Present (08/22/2022)   Hunger Vital Sign    Worried About Running Out of Food in the Last Year: Sometimes true    Ran Out of Food in the Last Year: Sometimes true  Transportation Needs: Patient Unable To Answer (08/22/2022)   PRAPARE - Transportation    Lack of Transportation (Medical): Patient unable to answer    Lack of Transportation (Non-Medical): Patient unable to answer  Recent Concern: Transportation Needs - Unmet Transportation Needs (06/13/2022)   Received from [facility], [facility]   [facility] - Transportation    Lack of Transportation (Medical): Not on file    Lack of Transportation (Non-Medical): Yes  Physical Activity: Not on file  Stress: Not on file  Social Connections: Unknown (08/06/2021)   Received from [facility], [facility]   Social Network    Social Network: Not on file   Additional Social History:  Sleep: Good  Appetite:  Good  Current Medications: Current Facility-Administered Medications  Medication Dose Route Frequency Provider Last Rate Last Admin   acetaminophen (TYLENOL) tablet 650 mg  650 mg Oral Q6H PRN [name], DO   650 mg at 12/08/22 1542   alum & mag hydroxide-simeth (MAALOX/MYLANTA) 200-200-20 MG/5ML suspension 30 mL  30 mL Oral Q4H PRN [name], DO   30 mL at 09/20/22 1142   diphenhydrAMINE (BENADRYL) capsule 50 mg  50 mg Oral Q6H PRN [name], DO   50 mg at 09/29/22 2254   Or   diphenhydrAMINE (BENADRYL) injection 50 mg  50 mg Intramuscular Q6H PRN [name], DO       haloperidol (HALDOL) tablet 5 mg  5 mg Oral Q6H PRN [name], DO   5 mg at 09/28/22 2149   Or   haloperidol lactate (HALDOL) injection 5 mg  5 mg Intramuscular Q6H PRN [name], DO       ibuprofen (ADVIL) tablet 600 mg  600 mg Oral Q6H PRN [name], [name], MD   600 mg at 12/01/22 2100   LORazepam (ATIVAN) tablet 1 mg  1 mg Oral TID PRN [name], DO   1 mg at 09/27/22 0025   magnesium hydroxide (MILK OF MAGNESIA) suspension 30 mL  30 mL Oral Daily PRN [name], DO   30 mL at 11/08/22 1012   traZODone (DESYREL) tablet 50 mg  50 mg Oral QHS PRN [name], DO   50 mg at 12/08/22 2140    Lab Results: No results found for this or any previous visit (from the past 48 hour(s)).  Blood Alcohol level:  Lab Results  Component Value Date   ETH <10 08/17/2022   ETH <10 01/11/2021    Metabolic Disorder Labs: No results found for: "HGBA1C", "MPG" No results found for: "PROLACTIN" No results found for: "CHOL", "TRIG", "HDL", "CHOLHDL", "VLDL", "LDLCALC"  Physical Findings: AIMS:  , ,  ,  ,    CIWA:    COWS:     Musculoskeletal: Strength & Muscle Tone: within normal limits Gait & Station: normal Patient leans: N/A  Psychiatric Specialty Exam:  Presentation  General Appearance:  Casual; Neat  Eye Contact: Fair  Speech: Slow; Slurred  Speech Volume: Decreased  Handedness: Right   Mood and Affect  Mood: Anxious  Affect: Inappropriate   Thought Process  Thought Processes: Disorganized  Descriptions of Associations:Loose  Orientation:Partial  Thought Content:Illogical; Rumination  History of Schizophrenia/Schizoaffective disorder:No data recorded Duration of Psychotic Symptoms:No data recorded Hallucinations:No data recorded Ideas of Reference:Paranoia  Suicidal Thoughts:No data recorded Homicidal Thoughts:No data recorded  Sensorium  Memory: Immediate Fair; Remote Poor  Judgment: Poor  Insight: Poor   Executive Functions   Concentration: Poor  Attention Span: Fair  Recall: [employer] of Knowledge: Fair  Language: Fair   Psychomotor Activity  Psychomotor Activity:No data recorded  Assets  Assets: Communication Skills   Sleep  Sleep:No data recorded   Physical Exam: Physical Exam Vitals and nursing note reviewed.  Constitutional:      Appearance: Normal appearance. She is normal weight.  Neurological:     General: No focal deficit present.     Mental Status: She is alert and oriented to person, place, and time.  Psychiatric:        Attention and Perception: Attention and perception normal.        Mood and Affect: Mood and  affect normal.        Speech: Speech normal.        Behavior: Behavior normal. Behavior is cooperative.        Thought Content: Thought content is delusional.        Cognition and Memory: Cognition and memory normal.    Review of Systems  Constitutional: Negative.   HENT: Negative.    Eyes: Negative.   Respiratory: Negative.    Cardiovascular: Negative.   Gastrointestinal: Negative.   Genitourinary: Negative.   Musculoskeletal: Negative.   Skin: Negative.   Neurological: Negative.   Endo/Heme/Allergies: Negative.   Psychiatric/Behavioral: Negative.     Blood pressure 138/80, pulse 70, temperature (!) 97.2 F (36.2 C), resp. rate 17, height 5\' 6"  (1.676 m), weight 64 kg, SpO2 98%. Body mass index is 22.76 kg/m.   Treatment Plan Summary: Daily contact with patient to assess and evaluate symptoms and progress in treatment, Medication management, and Plan continue current medications.  [name], DO 12/09/2022, 12:58 PM

## 2022-12-09 NOTE — Progress Notes (Signed)
Pt can be heard in her room talking to herself. Pt in NAD.

## 2022-12-09 NOTE — Progress Notes (Signed)
   12/09/22 0726  Psych Admission Type (Psych Patients Only)  Admission Status Voluntary  Psychosocial Assessment  Patient Complaints None  Eye Contact Fair  Facial Expression Blank  Affect Flat  Speech Logical/coherent  Interaction Assertive  Motor Activity Slow  Appearance/Hygiene Layered clothes  Behavior Characteristics Cooperative  Mood Pleasant  Thought Process  Coherency WDL  Content WDL  Delusions None reported or observed  Perception WDL  Hallucination Auditory;Visual  Judgment Impaired  Confusion None  Danger to Self  Current suicidal ideation? Denies  Danger to Others  Danger to Others None reported or observed

## 2022-12-09 NOTE — Progress Notes (Signed)
   12/09/22 0557  15 Minute Checks  Location Bedroom  Visual Appearance Calm  Behavior Sleeping  Sleep (Behavioral Health Patients Only)  Calculate sleep? (Click Yes once per 24 hr at 0600 safety check) Yes  Documented sleep last 24 hours 7.75

## 2022-12-09 NOTE — BHH Counselor (Signed)
CSW contacted Quincy Carnes [phone]) regarding which email would be best to send information to for pt's FL2.   Shonte stated she will send CSW her email in a text message.   CSW awaiting text message.    Reynaldo Minium, MSW, [place] 12/09/2022 1:27 PM

## 2022-12-09 NOTE — Group Note (Signed)
Date:  12/09/2022 Time:  9:58 AM  Group Topic/Focus:  Making Healthy Choices:   The focus of this group is to help patients identify negative/unhealthy choices they were using prior to admission and identify positive/healthier coping strategies to replace them upon discharge.    Participation Level:  Did Not Attend    Rodena Goldmann 12/09/2022, 9:58 AM

## 2022-12-09 NOTE — [place] FL2 (Signed)
[place] MEDICAID FL2 LEVEL OF CARE FORM     IDENTIFICATION  Patient Name: Betty Henderson Birthdate: 07-30-1960 Sex: female Admission Date (Current Location): 08/22/2022  [facility] [place] Number:  Producer, television/film/video and Address:  [facility], [address] [place], [place] [address]      Provider Number: [id number]  Attending Physician Name and Address:  [name],*  Relative Name and Phone Number:  Annabeth Mummert [phone])    Current Level of Care: Hospital Recommended Level of Care: Assisted Living Facility, Family Care Home Prior Approval Number:    Date Approved/Denied:   PASRR Number:    Discharge Plan: Other (Comment)    Current Diagnoses: Patient Active Problem List   Diagnosis Date Noted   Schizoaffective disorder, bipolar type (HCC) 08/19/2022   Midline low back pain without sciatica 06/14/2022   Malingering 05/31/2022   Neck pain 05/08/2022   Non compliance w medication regimen 02/07/2020   Mild cognitive impairment 10/12/2018   Tobacco use disorder 09/14/2018   Homeless single person 08/22/2017   Arthritis 05/04/2016   Low vitamin B12 level 05/04/2016   Closed avulsion fracture of distal end of left fibula 11/18/2014   Sprain of left ankle 11/18/2014   Acute exacerbation of chronic paranoid schizophrenia (HCC) 09/03/2014    Orientation RESPIRATION BLADDER Height & Weight     Self  Normal Continent Weight: 141 lb (64 kg) Height:  5\' 6"  (167.6 cm)  BEHAVIORAL SYMPTOMS/MOOD NEUROLOGICAL BOWEL NUTRITION STATUS  Wanderer   Continent    AMBULATORY STATUS COMMUNICATION OF NEEDS Skin   Independent Verbally Normal                       Personal Care Assistance Level of Assistance              Functional Limitations Info  Speech     Speech Info: Adequate    SPECIAL CARE FACTORS FREQUENCY                       Contractures Contractures Info: Not present    Additional  Factors Info  Psychotropic     Psychotropic Info: Invega Sustenna 156 mg, Invega 24 hr tablet 3 mg         Current Medications (12/09/2022):  This is the current hospital active medication list Current Facility-Administered Medications  Medication Dose Route Frequency Provider Last Rate Last Admin   acetaminophen (TYLENOL) tablet 650 mg  650 mg Oral Q6H PRN [name], DO   650 mg at 12/08/22 1542   alum & mag hydroxide-simeth (MAALOX/MYLANTA) 200-200-20 MG/5ML suspension 30 mL  30 mL Oral Q4H PRN [name], DO   30 mL at 09/20/22 1142   diphenhydrAMINE (BENADRYL) capsule 50 mg  50 mg Oral Q6H PRN [name], DO   50 mg at 09/29/22 2254   Or   diphenhydrAMINE (BENADRYL) injection 50 mg  50 mg Intramuscular Q6H PRN [name], DO       haloperidol (HALDOL) tablet 5 mg  5 mg Oral Q6H PRN [name], DO   5 mg at 09/28/22 2149   Or   haloperidol lactate (HALDOL) injection 5 mg  5 mg Intramuscular Q6H PRN [name], DO       ibuprofen (ADVIL) tablet 600 mg  600 mg Oral Q6H PRN [name], [name], MD   600 mg at 12/01/22 2100   LORazepam (ATIVAN)  tablet 1 mg  1 mg Oral TID PRN [name], DO   1 mg at 09/27/22 0025   magnesium hydroxide (MILK OF MAGNESIA) suspension 30 mL  30 mL Oral Daily PRN [name], DO   30 mL at 11/08/22 1012   traZODone (DESYREL) tablet 50 mg  50 mg Oral QHS PRN [name], DO   50 mg at 12/08/22 2140     Discharge Medications: Please see discharge summary for a list of discharge medications.  Relevant Imaging Results:  Relevant Lab Results:   Additional Information Pt is currently awaiting guardianship from the state, her petition has been filed. She should also be receiving Social Security soon as well.  Elza Rafter, [place]

## 2022-12-10 DIAGNOSIS — F250 Schizoaffective disorder, bipolar type: Secondary | ICD-10-CM | POA: Diagnosis not present

## 2022-12-10 NOTE — Plan of Care (Signed)
Problem: Education: Goal: Knowledge of General Education information will improve Description: Including pain rating scale, medication(s)/side effects and non-pharmacologic comfort measures Outcome: Progressing   Problem: Health Behavior/Discharge Planning: Goal: Ability to manage health-related needs will improve Outcome: Progressing   Problem: Clinical Measurements: Goal: Ability to maintain clinical measurements within normal limits will improve Outcome: Progressing Goal: Diagnostic test results will improve Outcome: Progressing   Problem: Nutrition: Goal: Adequate nutrition will be maintained Outcome: Progressing   Problem: Coping: Goal: Level of anxiety will decrease Outcome: Progressing   Problem: Elimination: Goal: Will not experience complications related to bowel motility Outcome: Progressing Goal: Will not experience complications related to urinary retention Outcome: Progressing   Problem: Pain Managment: Goal: General experience of comfort will improve Outcome: Progressing   Problem: Safety: Goal: Ability to remain free from injury will improve Outcome: Progressing   Problem: Skin Integrity: Goal: Risk for impaired skin integrity will decrease Outcome: Progressing

## 2022-12-10 NOTE — Group Note (Signed)
Date:  12/10/2022 Time:  9:53 PM  Group Topic/Focus:  Early Warning Signs:   The focus of this group is to help patients identify signs or symptoms they exhibit before slipping into an unhealthy state or crisis.    Participation Level:  Active  Participation Quality:  Appropriate  Affect:  Appropriate  Cognitive:  Alert  Insight: Improving  Engagement in Group:  Improving  Modes of Intervention:  Activity  Additional Comments:    [name] 12/10/2022, 9:53 PM

## 2022-12-10 NOTE — Group Note (Signed)
Recreation Therapy Group Note   Group Topic:General Recreation  Group Date: 12/10/2022 Start Time: 1400 End Time: 1455 Facilitators: Rosina Lowenstein, LRT, CTRS Location:  Day Room  Group Description: Bingo. LRT and patients played multiple games of Bingo with music playing in the background. LRT and pts discussed how this could be a leisure interest and the importance of doing things they enjoy post-discharge.   Goal Area(s) Addressed: Patient will identify leisure interests.  Patient will practice healthy decision making. Patient will engage in recreation activity.    Affect/Mood: Appropriate and Flat   Participation Level: Active and Engaged   Participation Quality: Independent   Behavior: Appropriate, Calm, and Cooperative   Speech/Thought Process: Coherent   Insight: Fair   Judgement: Good   Modes of Intervention: Cooperative Play   Patient Response to Interventions:  Attentive, Engaged, and Receptive   Education Outcome:  Acknowledges education   Clinical Observations/Individualized Feedback: Helenmarie was active in their participation of session activities and group discussion. Pt won a Facilities manager and chose chapstick as her prize. Pt interacted well with LRT and peers duration of session.   Plan: Continue to engage patient in RT group sessions 2-3x/week.   Rosina Lowenstein, LRT, CTRS 12/10/2022 3:15 PM
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
## 2022-12-10 NOTE — Group Note (Signed)
[facility] LCSW Group Therapy Note    Group Date: 12/10/2022 Start Time: 1315 End Time: 1400  Type of Therapy and Topic:  Group Therapy:  Overcoming Obstacles  Participation Level:  [facility] PARTICIPATION LEVEL: Active  Mood:  Description of Group:   In this group patients will be encouraged to explore what they see as obstacles to their own wellness and recovery. They will be guided to discuss their thoughts, feelings, and behaviors related to these obstacles. The group will process together ways to cope with barriers, with attention given to specific choices patients can make. Each patient will be challenged to identify changes they are motivated to make in order to overcome their obstacles. This group will be process-oriented, with patients participating in exploration of their own experiences as well as giving and receiving support and challenge from other group members.  Therapeutic Goals: 1. Patient will identify personal and current obstacles as they relate to admission. 2. Patient will identify barriers that currently interfere with their wellness or overcoming obstacles.  3. Patient will identify feelings, thought process and behaviors related to these barriers. 4. Patient will identify two changes they are willing to make to overcome these obstacles:    Summary of Patient Progress   Pt added to the conversation and was able to accept feedback from peers   Therapeutic Modalities:   Cognitive Behavioral Therapy Solution Focused Therapy Motivational Interviewing Relapse Prevention Therapy   Elza Rafter, LCSWA

## 2022-12-10 NOTE — Progress Notes (Signed)
[facility] MD Progress Note  Betty Henderson  MRN:  [id number]  Subjective: Case discussed with RN and social worker, chart reviewed, patient seen today during rounds. No behavioral issues reported by the staff. SW is working with DSS on getting patient SSI.  Patient reports she is doing good. She was pleasant during the assessment today. She has been attending groups. She reports that her sleep and appetite is good.  Patient denies any thoughts of harming herself or others.  Patient denies auditory or visual hallucinations. Patient is awaiting placement.  Principal Problem: Schizoaffective disorder, bipolar type (HCC) Diagnosis: Principal Problem:   Schizoaffective disorder, bipolar type (HCC)   Past Psychiatric History: Schizoaffective disorder, bipolar type.  Past Medical History:  Past Medical History:  Diagnosis Date   Anemia    Arthritis    Chronic pain    Drug-seeking behavior    Malingering    Osteopetrosis    Psychosis (HCC)    Schizoaffective disorder, bipolar type (HCC)     Past Surgical History:  Procedure Laterality Date   MOUTH SURGERY     TUBAL LIGATION     Family History:  Family History  Family history unknown: Yes   Family Psychiatric  History: Unremarkable Social History:  Social History   Substance and Sexual Activity  Alcohol Use Not Currently   Comment: 1 cocktail 3 weeks ago     Social History   Substance and Sexual Activity  Drug Use Not Currently   Types: Cocaine   Comment: states "it's legal though"    Social History   Socioeconomic History   Marital status: Single    Spouse name: Not on file   Number of children: Not on file   Years of education: Not on file   Highest education level: Not on file  Occupational History   Not on file  Tobacco Use   Smoking status: Every Day    Current packs/day: 0.50    Types: Cigarettes   Smokeless tobacco: Never  Vaping Use   Vaping status: Never Used  Substance and Sexual Activity   Alcohol  use: Not Currently    Comment: 1 cocktail 3 weeks ago   Drug use: Not Currently    Types: Cocaine    Comment: states "it's legal though"   Sexual activity: Never  Other Topics Concern   Not on file  Social History Narrative   Not on file   Social Determinants of Health   Financial Resource Strain: Not on file  Food Insecurity: Food Insecurity Present (08/22/2022)   Hunger Vital Sign    Worried About Running Out of Food in the Last Year: Sometimes true    Ran Out of Food in the Last Year: Sometimes true  Transportation Needs: Patient Unable To Answer (08/22/2022)   PRAPARE - Transportation    Lack of Transportation (Medical): Patient unable to answer    Lack of Transportation (Non-Medical): Patient unable to answer  Recent Concern: Transportation Needs - Unmet Transportation Needs (06/13/2022)   Received from [facility], [facility]   [facility] - Transportation    Lack of Transportation (Medical): Not on file    Lack of Transportation (Non-Medical): Yes  Physical Activity: Not on file  Stress: Not on file  Social Connections: Unknown (08/06/2021)   Received from [facility], [facility]   Social Network    Social Network: Not on file   Additional Social History:  Sleep: Good  Appetite:  Good  Current Medications: Current Facility-Administered Medications  Medication Dose Route Frequency Provider Last Rate Last Admin   acetaminophen (TYLENOL) tablet 650 mg  650 mg Oral Q6H PRN [name], DO   650 mg at 12/09/22 2054   alum & mag hydroxide-simeth (MAALOX/MYLANTA) 200-200-20 MG/5ML suspension 30 mL  30 mL Oral Q4H PRN [name], DO   30 mL at 09/20/22 1142   diphenhydrAMINE (BENADRYL) capsule 50 mg  50 mg Oral Q6H PRN [name], DO   50 mg at 09/29/22 2254   Or   diphenhydrAMINE (BENADRYL) injection 50 mg  50 mg Intramuscular Q6H PRN [name], DO       haloperidol (HALDOL) tablet  5 mg  5 mg Oral Q6H PRN [name], DO   5 mg at 09/28/22 2149   Or   haloperidol lactate (HALDOL) injection 5 mg  5 mg Intramuscular Q6H PRN [name], DO       ibuprofen (ADVIL) tablet 600 mg  600 mg Oral Q6H PRN [name], [name], MD   600 mg at 12/01/22 2100   LORazepam (ATIVAN) tablet 1 mg  1 mg Oral TID PRN [name], DO   1 mg at 09/27/22 0025   magnesium hydroxide (MILK OF MAGNESIA) suspension 30 mL  30 mL Oral Daily PRN [name], DO   30 mL at 11/08/22 1012   traZODone (DESYREL) tablet 50 mg  50 mg Oral QHS PRN [name], DO   50 mg at 12/09/22 2054    Lab Results: No results found for this or any previous visit (from the past 48 hour(s)).  Blood Alcohol level:  Lab Results  Component Value Date   ETH <10 08/17/2022   ETH <10 01/11/2021      Musculoskeletal: Strength & Muscle Tone: within normal limits Gait & Station: normal Patient leans: N/A   Psychiatric Specialty Exam:   Presentation  General Appearance:  Casual; Neat   Eye Contact: Fair   Speech: Spontaneous   Speech Volume: Normal   Handedness: Right     Mood and Affect  Mood: " Good"   Affect: Stable    Thought Processes: Improved,probably at baseline.    Descriptions of Associations: Intact   Orientation: Well-oriented   Thought Content:Denies SI/HI, at baseline   Hallucinations:Denies ,   Ideas of Reference:None noted   Suicidal Thoughts:Denies SI  Homicidal Thoughts:Denies HI   Sensorium  Memory: Immediate Fair; Remote Poor   Judgment: Fair, pt is Rx compliant   Insight: Limited     Executive Functions  Concentration: Improved   Attention Span: Improved   Language: Fair     Psychomotor Activity  Psychomotor Activity: Normal  Assets  Assets: Communication Skills     Sleep  Sleep:Improved     Physical Exam: Physical Exam Vitals and nursing note reviewed.  Constitutional:       Appearance: Normal appearance. She is normal weight.  Neurological:     General: No focal deficit present.     Mental Status: She is alert.      Review of Systems  Constitutional: Negative.   HENT: Negative.    Eyes: Negative.   Respiratory: Negative.    Cardiovascular: Negative.   Gastrointestinal: Negative.   Genitourinary: Negative.   Musculoskeletal: Negative.   Skin: Negative.   Neurological: Negative.   Endo/Heme/Allergies: Negative.     Blood pressure 121/71, pulse 65, temperature (!) 96.6 F (  35.9 C), resp. rate 16, height 5\' 6"  (1.676 m), weight 64 kg, SpO2 100%. Body mass index is 22.76 kg/m.   Treatment Plan Summary: Daily contact with patient to assess and evaluate symptoms and progress in treatment, Medication management, and Plan continue current medications.  Continue to monitor patient on as needed meds Patient has received Invega Sustenna 156 mg IM on 11/13/2022, the previous dose was was given on 10/15/2022.   Patient received another dose of Invega Sustenna 156 mg IM on 11/18/2022  QTc interval 431, 11/27/22    [name], MD

## 2022-12-10 NOTE — Progress Notes (Signed)
   12/10/22 0700  Psych Admission Type (Psych Patients Only)  Admission Status Voluntary  Psychosocial Assessment  Patient Complaints None  Eye Contact Brief  Facial Expression Flat  Affect Flat  Speech Logical/coherent  Interaction Assertive  Motor Activity Slow  Appearance/Hygiene Layered clothes  Behavior Characteristics Cooperative  Mood Pleasant  Thought Process  Coherency WDL  Content WDL  Delusions None reported or observed  Perception WDL  Hallucination Auditory;Visual  Judgment Impaired  Confusion None  Danger to Self  Current suicidal ideation? Denies  Agreement Not to Harm Self Yes  Danger to Others  Danger to Others None reported or observed  Danger to Others Abnormal  Harmful Behavior to others No threats or harm toward other people  Destructive Behavior No threats or harm toward property   Attended group. Denies SI/HI/AVH.

## 2022-12-10 NOTE — Progress Notes (Signed)
Patient rested the majority of the shift in her room in bed.  She came out briefly to receive medication for sleep &  acetaminophen for pain 8/10 in her legs.  She ambulated without difficulty.    She ate snacks then returned to her room.  She denied SI and HI.  She was heard talking to her room.    Q76m checks in place.  Plan of Care continued as per protocol.

## 2022-12-11 DIAGNOSIS — F250 Schizoaffective disorder, bipolar type: Secondary | ICD-10-CM | POA: Diagnosis not present

## 2022-12-11 NOTE — Progress Notes (Signed)
   12/11/22 1344  Psych Admission Type (Psych Patients Only)  Admission Status Voluntary  Psychosocial Assessment  Patient Complaints None  Eye Contact Brief  Facial Expression Flat  Affect Flat  Speech Logical/coherent  Interaction Assertive  Motor Activity Slow  Appearance/Hygiene Layered clothes  Thought Process  Coherency WDL  Content WDL  Delusions None reported or observed  Perception WDL  Hallucination Auditory;Visual  Judgment Impaired  Confusion None  Danger to Self  Current suicidal ideation? Denies  Agreement Not to Harm Self Yes  Danger to Others  Danger to Others None reported or observed  Danger to Others Abnormal  Harmful Behavior to others No threats or harm toward other people  Destructive Behavior No threats or harm toward property   Denies SI/HI/AVH. Minimal interaction with staff and peers. Mostly isolative except during meals and group.

## 2022-12-11 NOTE — Progress Notes (Signed)
   12/11/22 2115  Psych Admission Type (Psych Patients Only)  Admission Status Voluntary  Psychosocial Assessment  Patient Complaints None  Eye Contact Brief  Facial Expression Flat  Affect Flat  Speech Tangential  Interaction Assertive  Motor Activity Slow  Appearance/Hygiene Layered clothes  Behavior Characteristics Cooperative  Mood Pleasant  Thought Process  Coherency WDL  Content WDL  Delusions None reported or observed  Perception WDL  Hallucination Auditory;Visual (pt can be heard responding to internal stimuli in her room)  Judgment Impaired  Confusion Mild  Danger to Self  Current suicidal ideation? Denies  Danger to Others  Danger to Others None reported or observed   Progress note   D: Pt seen in her room in the bed. Pt denies SI, HI, AVH. Pt rates pain  0/10. Pt rates anxiety  0/10 and depression  0/10. Pt states that she had a good day. Pt still believes that someone came into her room and fell on her last night. Endorsed fatigue today. "I just wanted to stay in bed today. Morning came too soon and I didn't want to get up." No other concerns noted at this time.  A: Pt provided support and encouragement. Pt given scheduled medication as prescribed. PRNs as appropriate. Q15 min checks for safety.   R: Pt safe on the unit. Will continue to monitor.

## 2022-12-11 NOTE — [facility] Counselor (Signed)
CSW contacted Quincy Carnes [phone] find out the status of pt's [employer] being reinstated and to see if pt was assigned a placement coordinator as previously discussed.   Reynaldo Minium, MSW, [place] 12/11/2022 12:44 PM

## 2022-12-11 NOTE — [facility] Counselor (Signed)
CSW contacted Christia Reading [phone]) financial navigator at [facility] to discuss the status of pt's Medicaid and Disability applications.   Pt's usual financial navigator, Rubye Beach, is out of office and her case was given to Christia Reading in the meantime.  CSW unable to reach [place], left HIPAA compliant VM requesting return call.    Reynaldo Minium, MSW, [place] 12/11/2022 12:47 PM

## 2022-12-11 NOTE — Plan of Care (Signed)
  Problem: Education: Goal: Knowledge of General Education information will improve Description: Including pain rating scale, medication(s)/side effects and non-pharmacologic comfort measures Outcome: Progressing   Problem: Health Behavior/Discharge Planning: Goal: Ability to manage health-related needs will improve Outcome: Progressing   Problem: Clinical Measurements: Goal: Ability to maintain clinical measurements within normal limits will improve Outcome: Progressing Goal: Diagnostic test results will improve Outcome: Progressing   Problem: Nutrition: Goal: Adequate nutrition will be maintained Outcome: Progressing   Problem: Coping: Goal: Level of anxiety will decrease Outcome: Progressing   Problem: Elimination: Goal: Will not experience complications related to bowel motility Outcome: Progressing   Problem: Pain Managment: Goal: General experience of comfort will improve Outcome: Progressing   Problem: Safety: Goal: Ability to remain free from injury will improve Outcome: Progressing   Problem: Skin Integrity: Goal: Risk for impaired skin integrity will decrease Outcome: Progressing

## 2022-12-11 NOTE — Group Note (Signed)
Date:  12/11/2022 Time:  10:21 PM  Group Topic/Focus:  Crisis Planning:   The purpose of this group is to help patients create a crisis plan for use upon discharge or in the future, as needed.    Participation Level:  Did Not Attend  Participation Quality:   Did Not Attend  Affect:   Did Not Attend  Cognitive:   Did Not Attend  Insight: None  Engagement in Group:  None  Modes of Intervention:   Did Not Attend  Additional Comments:    [name] 12/11/2022, 10:21 PM

## 2022-12-11 NOTE — Group Note (Signed)
Date:  12/11/2022 Time:  11:06 AM  Group Topic/Focus:  Goals Group:The focus of this group is to help patients establish short term and long term goals.Creating a realistic plan that the patients can accomplish when they are discharged.    Participation Level:  Did Not Attend  Participation Quality:    Affect:    Cognitive:    Insight:   Engagement in Group:    Modes of Intervention:    Additional Comments:  Did not attend  Silvio Sausedo T Justine Cossin 12/11/2022, 11:06 AM

## 2022-12-11 NOTE — Progress Notes (Signed)
[facility] MD Progress Note  Betty Henderson  MRN:  [id number]  Subjective: Case discussed with RN and social worker, chart reviewed, patient seen today during rounds. No behavioral issues reported by the staff.  Patient reports she is doing good. She was pleasant during the assessment today. She has been attending groups. She reports that her sleep and appetite is good.  Patient denies any thoughts of harming herself or others.  Patient denies auditory or visual hallucinations. Patient is awaiting placement.  Principal Problem: Schizoaffective disorder, bipolar type (HCC) Diagnosis: Principal Problem:   Schizoaffective disorder, bipolar type (HCC)   Past Psychiatric History: Schizoaffective disorder, bipolar type.  Past Medical History:  Past Medical History:  Diagnosis Date   Anemia    Arthritis    Chronic pain    Drug-seeking behavior    Malingering    Osteopetrosis    Psychosis (HCC)    Schizoaffective disorder, bipolar type (HCC)     Past Surgical History:  Procedure Laterality Date   MOUTH SURGERY     TUBAL LIGATION     Family History:  Family History  Family history unknown: Yes   Family Psychiatric  History: Unremarkable Social History:  Social History   Substance and Sexual Activity  Alcohol Use Not Currently   Comment: 1 cocktail 3 weeks ago     Social History   Substance and Sexual Activity  Drug Use Not Currently   Types: Cocaine   Comment: states "it's legal though"    Social History   Socioeconomic History   Marital status: Single    Spouse name: Not on file   Number of children: Not on file   Years of education: Not on file   Highest education level: Not on file  Occupational History   Not on file  Tobacco Use   Smoking status: Every Day    Current packs/day: 0.50    Types: Cigarettes   Smokeless tobacco: Never  Vaping Use   Vaping status: Never Used  Substance and Sexual Activity   Alcohol use: Not Currently    Comment: 1 cocktail 3  weeks ago   Drug use: Not Currently    Types: Cocaine    Comment: states "it's legal though"   Sexual activity: Never  Other Topics Concern   Not on file  Social History Narrative   Not on file   Social Determinants of Health   Financial Resource Strain: Not on file  Food Insecurity: Food Insecurity Present (08/22/2022)   Hunger Vital Sign    Worried About Running Out of Food in the Last Year: Sometimes true    Ran Out of Food in the Last Year: Sometimes true  Transportation Needs: Patient Unable To Answer (08/22/2022)   PRAPARE - Transportation    Lack of Transportation (Medical): Patient unable to answer    Lack of Transportation (Non-Medical): Patient unable to answer  Recent Concern: Transportation Needs - Unmet Transportation Needs (06/13/2022)   Received from [facility], [facility]   [facility] - Transportation    Lack of Transportation (Medical): Not on file    Lack of Transportation (Non-Medical): Yes  Physical Activity: Not on file  Stress: Not on file  Social Connections: Unknown (08/06/2021)   Received from [facility], [facility]   Social Network    Social Network: Not on file   Additional Social History:  Sleep: Good  Appetite:  Good  Current Medications: Current Facility-Administered Medications  Medication Dose Route Frequency Provider Last Rate Last Admin   acetaminophen (TYLENOL) tablet 650 mg  650 mg Oral Q6H PRN [name], DO   650 mg at 12/10/22 2128   alum & mag hydroxide-simeth (MAALOX/MYLANTA) 200-200-20 MG/5ML suspension 30 mL  30 mL Oral Q4H PRN [name], DO   30 mL at 09/20/22 1142   diphenhydrAMINE (BENADRYL) capsule 50 mg  50 mg Oral Q6H PRN [name], DO   50 mg at 09/29/22 2254   Or   diphenhydrAMINE (BENADRYL) injection 50 mg  50 mg Intramuscular Q6H PRN [name], DO       haloperidol (HALDOL) tablet 5 mg  5 mg Oral Q6H PRN [name], DO   5 mg at 09/28/22 2149   Or   haloperidol lactate (HALDOL) injection 5 mg  5 mg Intramuscular Q6H PRN [name], DO       ibuprofen (ADVIL) tablet 600 mg  600 mg Oral Q6H PRN [name], [name], MD   600 mg at 12/10/22 2047   LORazepam (ATIVAN) tablet 1 mg  1 mg Oral TID PRN [name], DO   1 mg at 09/27/22 0025   magnesium hydroxide (MILK OF MAGNESIA) suspension 30 mL  30 mL Oral Daily PRN [name], DO   30 mL at 11/08/22 1012   traZODone (DESYREL) tablet 50 mg  50 mg Oral QHS PRN [name], DO   50 mg at 12/10/22 2128    Lab Results: No results found for this or any previous visit (from the past 48 hour(s)).  Blood Alcohol level:  Lab Results  Component Value Date   ETH <10 08/17/2022   ETH <10 01/11/2021      Musculoskeletal: Strength & Muscle Tone: within normal limits Gait & Station: normal Patient leans: N/A   Psychiatric Specialty Exam:   Presentation  General Appearance:  Casual; Neat   Eye Contact: Fair   Speech: Spontaneous   Speech Volume: Normal   Handedness: Right     Mood and Affect  Mood: " Good"   Affect: Stable    Thought Processes: Improved,probably at baseline.    Descriptions of Associations: Intact   Orientation: Well-oriented   Thought Content:Denies SI/HI, at baseline   Hallucinations:Denies ,   Ideas of Reference:None noted   Suicidal Thoughts:Denies SI  Homicidal Thoughts:Denies HI   Sensorium  Memory: Immediate Fair; Remote Poor   Judgment: Fair, pt is Rx compliant   Insight: Limited     Executive Functions  Concentration: Improved   Attention Span: Improved   Language: Fair     Psychomotor Activity  Psychomotor Activity: Normal  Assets  Assets: Communication Skills     Sleep  Sleep:Improved     Physical Exam: Physical Exam Vitals and nursing note reviewed.  Constitutional:      Appearance: Normal appearance. She is  normal weight.  Neurological:     General: No focal deficit present.     Mental Status: She is alert.      Review of Systems  Constitutional: Negative.   HENT: Negative.    Eyes: Negative.   Respiratory: Negative.    Cardiovascular: Negative.   Gastrointestinal: Negative.   Genitourinary: Negative.   Musculoskeletal: Negative.   Skin: Negative.   Neurological: Negative.   Endo/Heme/Allergies: Negative.     Blood pressure 97/63, pulse 71, temperature (!) 97.1 F (  36.2 C), resp. rate 16, height 5\' 6"  (1.676 m), weight 64 kg, SpO2 100%. Body mass index is 22.76 kg/m.   Treatment Plan Summary: Daily contact with patient to assess and evaluate symptoms and progress in treatment, Medication management, and Plan continue current medications.  Continue to monitor patient on as needed meds Patient has received Invega Sustenna 156 mg IM on 11/13/2022, the previous dose was was given on 10/15/2022.   Patient received another dose of Invega Sustenna 156 mg IM on 11/18/2022  QTc interval 431, 11/27/22    [name], MD

## 2022-12-11 NOTE — Group Note (Signed)
Recreation Therapy Group Note   Group Topic:Communication  Group Date: 12/11/2022 Start Time: 1400 End Time: 1445 Facilitators: Rosina Lowenstein, LRT, CTRS Location: Courtyard   Group Description: Emotional Check in. Patient sat and talked with LRT about how they are doing and whatever else is on their mind. LRT provided active listening, reassurance and encouragement. Pts were given the opportunity to listen to music or play cornhole while getting fresh air and sunlight in the courtyard.    Goal Area(s) Addressed: Patient will engage in conversation with LRT. Patient will communicate their wants, needs, or questions.  Patient will practice a new coping skill of "talking to someone".   Affect/Mood: N/A   Participation Level: Did not attend    Clinical Observations/Individualized Feedback: Shelitha did not attend group.  Plan: Continue to engage patient in RT group sessions 2-3x/week.   Rosina Lowenstein, LRT, CTRS 12/11/2022 2:49 PM

## 2022-12-12 DIAGNOSIS — F250 Schizoaffective disorder, bipolar type: Secondary | ICD-10-CM | POA: Diagnosis not present

## 2022-12-12 NOTE — BHH Counselor (Signed)
CSW sent emails to both Christia Reading with [employer] at [employer] and Quincy Carnes at DSS to follow-up on unreturned phone calls from yesterday.    Reynaldo Minium, MSW, [place] 12/12/2022 11:07 AM

## 2022-12-12 NOTE — BH IP Treatment Plan (Signed)
Interdisciplinary Treatment and Diagnostic Plan Update  12/12/2022 Time of Session: 9:30 AM  Betty Henderson MRN: [id number]  Principal Diagnosis: Schizoaffective disorder, bipolar type (HCC)  Secondary Diagnoses: Principal Problem:   Schizoaffective disorder, bipolar type (HCC)   Current Medications:  Current Facility-Administered Medications  Medication Dose Route Frequency Provider Last Rate Last Admin   acetaminophen (TYLENOL) tablet 650 mg  650 mg Oral Q6H PRN [name], DO   650 mg at 12/10/22 2128   alum & mag hydroxide-simeth (MAALOX/MYLANTA) 200-200-20 MG/5ML suspension 30 mL  30 mL Oral Q4H PRN [name], DO   30 mL at 09/20/22 1142   diphenhydrAMINE (BENADRYL) capsule 50 mg  50 mg Oral Q6H PRN [name], DO   50 mg at 09/29/22 2254   Or   diphenhydrAMINE (BENADRYL) injection 50 mg  50 mg Intramuscular Q6H PRN [name], DO       haloperidol (HALDOL) tablet 5 mg  5 mg Oral Q6H PRN [name], DO   5 mg at 09/28/22 2149   Or   haloperidol lactate (HALDOL) injection 5 mg  5 mg Intramuscular Q6H PRN [name], DO       ibuprofen (ADVIL) tablet 600 mg  600 mg Oral Q6H PRN [name], [name], MD   600 mg at 12/10/22 2047   LORazepam (ATIVAN) tablet 1 mg  1 mg Oral TID PRN [name], DO   1 mg at 09/27/22 0025   magnesium hydroxide (MILK OF MAGNESIA) suspension 30 mL  30 mL Oral Daily PRN [name], DO   30 mL at 11/08/22 1012   traZODone (DESYREL) tablet 50 mg  50 mg Oral QHS PRN [name], DO   50 mg at 12/11/22 2126   PTA Medications: Medications Prior to Admission  Medication Sig Dispense Refill Last Dose   ondansetron (ZOFRAN) 4 MG tablet Take 1 tablet (4 mg total) by mouth every 8 (eight) hours as needed for nausea or vomiting. (Patient not taking: Reported on 08/17/2022) 20 tablet 0     Patient Stressors: Financial difficulties   Health problems    Medication change or noncompliance    Patient Strengths: Ability for insight   Treatment Modalities: Medication Management, Group therapy, Case management,  1 to 1 session with clinician, Psychoeducation, Recreational therapy.   Physician Treatment Plan for Primary Diagnosis: Schizoaffective disorder, bipolar type (HCC) Long Term Goal(s): Improvement in symptoms so as ready for discharge   Short Term Goals: Ability to identify changes in lifestyle to reduce recurrence of condition will improve Ability to verbalize feelings will improve Ability to disclose and discuss suicidal ideas Ability to demonstrate self-control will improve Ability to identify and develop effective coping behaviors will improve Ability to maintain clinical measurements within normal limits will improve Compliance with prescribed medications will improve Ability to identify triggers associated with substance abuse/mental health issues will improve  Medication Management: Evaluate patient's response, side effects, and tolerance of medication regimen.  Therapeutic Interventions: 1 to 1 sessions, Unit Group sessions and Medication administration.  Evaluation of Outcomes: Progressing  Physician Treatment Plan for Secondary Diagnosis: Principal Problem:   Schizoaffective disorder, bipolar type (HCC)  Long Term Goal(s): Improvement in symptoms so as ready for discharge   Short Term Goals: Ability to identify changes in lifestyle to reduce recurrence of condition will improve Ability to verbalize feelings will improve Ability to disclose and discuss suicidal ideas Ability to demonstrate self-control will improve Ability to identify  and develop effective coping behaviors will improve Ability to maintain clinical measurements within normal limits will improve Compliance with prescribed medications will improve Ability to identify triggers associated with substance abuse/mental health issues will improve      Medication Management: Evaluate patient's response, side effects, and tolerance of medication regimen.  Therapeutic Interventions: 1 to 1 sessions, Unit Group sessions and Medication administration.  Evaluation of Outcomes: Progressing   RN Treatment Plan for Primary Diagnosis: Schizoaffective disorder, bipolar type (HCC) Long Term Goal(s): Knowledge of disease and therapeutic regimen to maintain health will improve  Short Term Goals: Ability to remain free from injury will improve, Ability to verbalize frustration and anger appropriately will improve, Ability to demonstrate self-control, Ability to participate in decision making will improve, Ability to verbalize feelings will improve, Ability to disclose and discuss suicidal ideas, Ability to identify and develop effective coping behaviors will improve, and Compliance with prescribed medications will improve  Medication Management: RN will administer medications as ordered by provider, will assess and evaluate patient's response and provide education to patient for prescribed medication. RN will report any adverse and/or side effects to prescribing provider.  Therapeutic Interventions: 1 on 1 counseling sessions, Psychoeducation, Medication administration, Evaluate responses to treatment, Monitor vital signs and CBGs as ordered, Perform/monitor CIWA, COWS, AIMS and Fall Risk screenings as ordered, Perform wound care treatments as ordered.  Evaluation of Outcomes: Progressing   LCSW Treatment Plan for Primary Diagnosis: Schizoaffective disorder, bipolar type (HCC) Long Term Goal(s): Safe transition to appropriate next level of care at discharge, Engage patient in therapeutic group addressing interpersonal concerns.  Short Term Goals: Engage patient in aftercare planning with referrals and resources, Increase social support, Increase ability to appropriately verbalize feelings, Increase emotional regulation, Facilitate acceptance of mental  health diagnosis and concerns, Facilitate patient progression through stages of change regarding substance use diagnoses and concerns, and Increase skills for wellness and recovery  Therapeutic Interventions: Assess for all discharge needs, 1 to 1 time with Social worker, Explore available resources and support systems, Assess for adequacy in community support network, Educate family and significant other(s) on suicide prevention, Complete Psychosocial Assessment, Interpersonal group therapy.  Evaluation of Outcomes: Progressing   Progress in Treatment: Attending groups: Yes. and No. Participating in groups: Yes. and No. Taking medication as prescribed: Yes. Toleration medication: Yes. Family/Significant other contact made: Yes, individual(s) contacted:  Tamerah Rickards [phone] Patient understands diagnosis: Yes. Discussing patient identified problems/goals with staff: Yes. Medical problems stabilized or resolved: Yes. Denies suicidal/homicidal ideation: Yes. Issues/concerns per patient self-inventory: No. Other: None   New problem(s) identified: No, Describe:  none Updates 12/12/22: No changes made at this time    New Short Term/Long Term Goal(s): Update 6/30: none at this time. Update 09/27/2022:  No changes at this time.  Update 10/02/2022:  No changes at this time. Update 10/07/22: No changes at this time 10/12/22: No changes at this time Update 10/18/22: No changes at this time Update 10/23/22: No changes at this time Update 10/28/22: No changes at this time Update 11/07/22: No changes at this time Update 11/12/22: No changes at this time  Update 11/17/22: None at this time. Update 11/22/22: None at this time. Update 11/27/22 No changes at this time. Update 12/02/22 No changes at this time  Update 12/07/22: None at this time. Updates 12/12/22: No changes made at this time    Patient Goals:  Update 6/30: none at this time. Update 09/27/2022:  No changes at this time. Update 10/02/2022:  No changes  at  this time. Update 10/07/22: No changes at this time 10/12/22: No changes at this time Update 10/18/22: No changes at this time Update 10/23/22: No changes at this time Update 10/28/22: No changes at this time Update 11/07/22: No changes at this time Update 11/12/22: No changes at this time Update 11/17/22: None at this time. Update 11/22/22: None at this time. Update 11/27/22 No changes at this time Update 12/02/22 No changes at this time   Update 12/07/22: None at this time. Updates 12/12/22: No changes made at this time      Discharge Plan or Barriers: Update 6/30: APS report has been made and patient is being investigated for guardianship needs.  Remains homeless with limited supports. Update 09/27/2022:  No changes at this time.  Update 10/02/2022:  Patient remains safe on the unit at this time.  Patient remains psychotic at this time.  APS report has been made, however, no follow up from the caseworker on her case.  No safe discharge identified.  CSW has requested that application for Medicaid and disability be completed.   Update 10/07/22: No changes at this time 10/12/22: No changes at this time Update 10/18/22: No changes at this time Update 10/23/22: No changes at this time Update 10/23/22: No changes at this time Update 10/28/22: According to [facility], pt's caseworker at DSS, the petition was filed for guardianship on 10/21/22, now awaiting for the petition to be approvedUpdate 11/07/22: No changes at this time Update 11/12/22: CSW sent over patients information to [facility] and [facility], awaiting a response. CSW awaiting word from DSS regarding pt's petition and what agency will become her guardian. Update 11/17/22: No changes at this time. Update 11/22/22: CSW has sent patients information to multiple facilities that were suggested by leadership, pt has been denied from the facility or the facility does not respond. CSW continues to send pt's information to nursing homes. Update 11/27/22 CSW contacted DSS to inquire  about the status of guardianship. No response at this time Update 12/02/22 Supervisor Loraine Leriche contacted DSS who stated they are not taking pt on for guardianship but that Quincy Carnes will be present tomorrow to do a visit with social worker and pt and that pt's son will be signing documentation for placement for the pt. Update 12/07/22: [facility] DSS has declined to take guardianship. Son, Casimiro Needle is to step into a more present role in deciding placement. A conversation is still needed. Updates 12/12/22: CSW staffed case with leadership and they state that a family meeting must happen with pt's son. CSW contacted pt's financial navigator and her DSS worker so that they may be present on the call and are able to speak to the efforts they have made to secure pt funding and housing. CSW waiting on responses from both, leadership is aware,    Reason for Continuation of Hospitalization: Hallucinations Mania Medication stabilization   Estimated Length of Stay:  Update 6/30: 1-7 days Update 09/27/2022:  TBD Update 10/02/2022:  No changes at this time. UpdaUpdate 10/18/22: No changes at this time te 10/07/22: No changes at this time Update 10/18/22: No changes at this time Update 10/23/22: No changes at this time Update 10/28/22: No changes at this time Update 11/07/22: No changes at this time Update 11/12/22: No changes at this time  Update 11/17/22: No changes at this time. Update 11/22/22: None at this time. Update 11/27/22 No changes at this time. Update 12/02/22 No changes at this time  Update 12/07/22: None at  this time. Updates 12/12/22: No changes made at this time   Last 3 [place] Suicide Severity Risk Score: Flowsheet Row Admission (Current) from 08/22/2022 in [facility] BE[facility]AL [facility] ED from 08/17/2022 in [facility] Emergency Department at [facility] ED from 08/12/2022 in [facility] Emergency Department at [facility]  C-SSRS RISK CATEGORY Low Risk No Risk No Risk       Last [facility] Par  2/9 Scores:     No data to display          Scribe for Treatment Team: Elza Rafter, Theresia Majors 12/12/2022 11:11 AM

## 2022-12-12 NOTE — Progress Notes (Signed)
[name]  Betty Henderson  MRN:  [id number]  Subjective: Case discussed in multidisciplinary meeting today, chart reviewed, patient seen today during rounds. No behavioral issues reported by the staff.  Patient reports she is doing fine today. She has been attending groups. She reports that her sleep and appetite is good.  Patient denies any thoughts of harming herself or others.  Patient denies auditory or visual hallucinations. Patient is awaiting placement.  Principal Problem: Schizoaffective disorder, bipolar type (HCC) Diagnosis: Principal Problem:   Schizoaffective disorder, bipolar type (HCC)   Past Psychiatric History: Schizoaffective disorder, bipolar type.  Past Medical History:  Past Medical History:  Diagnosis Date   Anemia    Arthritis    Chronic pain    Drug-seeking behavior    Malingering    Osteopetrosis    Psychosis (HCC)    Schizoaffective disorder, bipolar type (HCC)     Past Surgical History:  Procedure Laterality Date   MOUTH SURGERY     TUBAL LIGATION     Family History:  Family History  Family history unknown: Yes   Family Psychiatric  History: Unremarkable Social History:  Social History   Substance and Sexual Activity  Alcohol Use Not Currently   Comment: 1 cocktail 3 weeks ago     Social History   Substance and Sexual Activity  Drug Use Not Currently   Types: Cocaine   Comment: states "it's legal though"    Social History   Socioeconomic History   Marital status: Single    Spouse name: Not on file   Number of children: Not on file   Years of education: Not on file   Highest education level: Not on file  Occupational History   Not on file  Tobacco Use   Smoking status: Every Day    Current packs/day: 0.50    Types: Cigarettes   Smokeless tobacco: Never  Vaping Use   Vaping status: Never Used  Substance and Sexual Activity   Alcohol use: Not Currently    Comment: 1 cocktail 3 weeks ago   Drug use: Not  Currently    Types: Cocaine    Comment: states "it's legal though"   Sexual activity: Never  Other Topics Concern   Not on file  Social History Narrative   Not on file   Social Determinants of Health   Financial Resource Strain: Not on file  Food Insecurity: Food Insecurity Present (08/22/2022)   Hunger Vital Sign    Worried About Running Out of Food in the Last Year: Sometimes true    Ran Out of Food in the Last Year: Sometimes true  Transportation Needs: Patient Unable To Answer (08/22/2022)   PRAPARE - Transportation    Lack of Transportation (Medical): Patient unable to answer    Lack of Transportation (Non-Medical): Patient unable to answer  Recent Concern: Transportation Needs - Unmet Transportation Needs (06/13/2022)   Received from [facility], [facility]   [facility] - Transportation    Lack of Transportation (Medical): Not on file    Lack of Transportation (Non-Medical): Yes  Physical Activity: Not on file  Stress: Not on file  Social Connections: Unknown (08/06/2021)   Received from [facility], [facility]   Social Network    Social Network: Not on file   Additional Social History:                         Sleep: Good  Appetite:  Good  Current Medications: Current Facility-Administered Medications  Medication Dose Route Frequency Provider Last Rate Last Admin   acetaminophen (TYLENOL) tablet 650 mg  650 mg Oral Q6H PRN [name], DO   650 mg at 12/10/22 2128   alum & mag hydroxide-simeth (MAALOX/MYLANTA) 200-200-20 MG/5ML suspension 30 mL  30 mL Oral Q4H PRN [name], DO   30 mL at 09/20/22 1142   diphenhydrAMINE (BENADRYL) capsule 50 mg  50 mg Oral Q6H PRN [name], DO   50 mg at 09/29/22 2254   Or   diphenhydrAMINE (BENADRYL) injection 50 mg  50 mg Intramuscular Q6H PRN [name], DO       haloperidol (HALDOL) tablet 5 mg  5 mg Oral Q6H PRN [name], DO   5 mg at 09/28/22  2149   Or   haloperidol lactate (HALDOL) injection 5 mg  5 mg Intramuscular Q6H PRN [name], DO       ibuprofen (ADVIL) tablet 600 mg  600 mg Oral Q6H PRN [name], [name], MD   600 mg at 12/10/22 2047   LORazepam (ATIVAN) tablet 1 mg  1 mg Oral TID PRN [name], DO   1 mg at 09/27/22 0025   magnesium hydroxide (MILK OF MAGNESIA) suspension 30 mL  30 mL Oral Daily PRN [name], DO   30 mL at 11/08/22 1012   traZODone (DESYREL) tablet 50 mg  50 mg Oral QHS PRN [name], DO   50 mg at 12/11/22 2126    Lab Results: No results found for this or any previous visit (from the past 48 hour(s)).  Blood Alcohol level:  Lab Results  Component Value Date   ETH <10 08/17/2022   ETH <10 01/11/2021      Musculoskeletal: Strength & Muscle Tone: within normal limits Gait & Station: normal Patient leans: N/A   Psychiatric Specialty Exam:   Presentation  General Appearance:  Casual; Neat   Eye Contact: Fair   Speech: Spontaneous   Speech Volume: Normal   Handedness: Right     Mood and Affect  Mood: " Fine"   Affect: Stable    Thought Processes: Improved,probably at baseline.    Descriptions of Associations: Intact   Orientation: Well-oriented   Thought Content:Denies SI/HI, at baseline   Hallucinations:Denies ,   Ideas of Reference:None noted   Suicidal Thoughts:Denies SI  Homicidal Thoughts:Denies HI   Sensorium  Memory: Immediate Fair; Remote Poor   Judgment: Fair, pt is Rx compliant   Insight: Limited     Executive Functions  Concentration: Improved   Attention Span: Improved   Language: Fair     Psychomotor Activity  Psychomotor Activity: Normal  Assets  Assets: Communication Skills     Sleep  Sleep:Improved     Physical Exam: Physical Exam Vitals and nursing note reviewed.  Constitutional:      Appearance: Normal appearance. She is normal weight.  Neurological:      General: No focal deficit present.     Mental Status: She is alert.      Review of Systems  Constitutional: Negative.   HENT: Negative.    Eyes: Negative.   Respiratory: Negative.    Cardiovascular: Negative.   Gastrointestinal: Negative.   Genitourinary: Negative.   Musculoskeletal: Negative.   Skin: Negative.   Neurological: Negative.   Endo/Heme/Allergies: Negative.     Blood pressure (!) 107/51, pulse 72, temperature 98.2 F (36.8 C), resp. rate 16, height 5'  6" (1.676 m), weight 64 kg, SpO2 99%. Body mass index is 22.76 kg/m.   Treatment Plan Summary: Daily contact with patient to assess and evaluate symptoms and progress in treatment, Medication management, and Plan continue current medications.  Continue to monitor patient on as needed meds Patient has received Invega Sustenna 156 mg IM on 11/13/2022, the previous dose was was given on 10/15/2022.   Patient received another dose of Invega Sustenna 156 mg IM on 11/18/2022  QTc interval 431, 11/27/22    [name], MD

## 2022-12-12 NOTE — Progress Notes (Signed)
Pt can be heard down the hallway talking to herself in her room.

## 2022-12-12 NOTE — Progress Notes (Signed)
   12/12/22 0600  15 Minute Checks  Location Bedroom  Visual Appearance Calm  Behavior Composed  Sleep (Behavioral Health Patients Only)  Calculate sleep? (Click Yes once per 24 hr at 0600 safety check) Yes  Documented sleep last 24 hours 10.25

## 2022-12-12 NOTE — Progress Notes (Signed)
Patient is a voluntary admission to [facility] since 08/22/22 for SAD Bipolar disorder. Is currently awaiting guardianship and then placement. Has mostly been isolating in her room, coming out only for meals. Is calm, cooperative with peers and staff. Denies SI, HI, AVH, Depression and anxiety but can be seen and heard talking to herself quite often. Will continue to monitor.

## 2022-12-12 NOTE — Plan of Care (Signed)
  Problem: Health Behavior/Discharge Planning: Goal: Ability to manage health-related needs will improve Outcome: Progressing   Problem: Clinical Measurements: Goal: Ability to maintain clinical measurements within normal limits will improve Outcome: Progressing Goal: Diagnostic test results will improve Outcome: Progressing   Problem: Nutrition: Goal: Adequate nutrition will be maintained Outcome: Progressing   Problem: Coping: Goal: Level of anxiety will decrease Outcome: Progressing   Problem: Safety: Goal: Ability to remain free from injury will improve Outcome: Progressing   Problem: Skin Integrity: Goal: Risk for impaired skin integrity will decrease Outcome: Progressing

## 2022-12-12 NOTE — Group Note (Signed)
Date:  12/12/2022 Time:  8:47 PM  Group Topic/Focus:  Healthy Communication:   The focus of this group is to discuss communication, barriers to communication, as well as healthy ways to communicate with others.    Participation Level:  Active  Participation Quality:  Appropriate  Affect:  Appropriate and Flat  Cognitive:  Appropriate  Insight: Appropriate  Engagement in Group:  Engaged  Modes of Intervention:  Clarification and Discussion  Additional Comments:    [name] 12/12/2022, 8:47 PM

## 2022-12-13 DIAGNOSIS — F250 Schizoaffective disorder, bipolar type: Secondary | ICD-10-CM | POA: Diagnosis not present

## 2022-12-13 NOTE — Group Note (Signed)
Date:  12/13/2022 Time:  10:22 AM  Group Topic/Focus:  Self Care:   The focus of this group is to help patients understand the importance of self-care in order to improve or restore emotional, physical, spiritual, interpersonal, and financial health.    Participation Level:  Active  Participation Quality:  Attentive  Affect:  Appropriate  Cognitive:  Appropriate  Insight: Appropriate  Engagement in Group:  Improving  Modes of Intervention:  Activity and Discussion  Additional Comments:  None  Rodena Goldmann 12/13/2022, 10:22 AM

## 2022-12-13 NOTE — Progress Notes (Signed)
   12/13/22 2200  Psych Admission Type (Psych Patients Only)  Admission Status Voluntary  Psychosocial Assessment  Patient Complaints None  Eye Contact Brief  Facial Expression Flat  Affect Flat  Speech Soft;Slurred  Interaction Assertive  Motor Activity Slow  Appearance/Hygiene Layered clothes  Behavior Characteristics Cooperative  Mood Pleasant  Thought Process  Coherency WDL  Content WDL  Delusions None reported or observed  Perception WDL  Hallucination Auditory;Visual  Judgment Impaired  Confusion Mild  Danger to Self  Current suicidal ideation? Denies  Agreement Not to Harm Self Yes  Description of Agreement Verbal  Danger to Others  Danger to Others None reported or observed  Danger to Others Abnormal  Harmful Behavior to others No threats or harm toward other people  Destructive Behavior No threats or harm toward property

## 2022-12-13 NOTE — Group Note (Signed)
[facility] LCSW Group Therapy Note   Group Date: 12/13/2022 Start Time: 1315 End Time: 1400   Type of Therapy/Topic:  Group Therapy:  Balance in Life  Participation Level:  Active   Description of Group:    This group will address the concept of balance and how it feels and looks when one is unbalanced. Patients will be encouraged to process areas in their lives that are out of balance, and identify reasons for remaining unbalanced. Facilitators will guide patients utilizing problem- solving interventions to address and correct the stressor making their life unbalanced. Understanding and applying boundaries will be explored and addressed for obtaining  and maintaining a balanced life. Patients will be encouraged to explore ways to assertively make their unbalanced needs known to significant others in their lives, using other group members and facilitator for support and feedback.  Therapeutic Goals: Patient will identify two or more emotions or situations they have that consume much of in their lives. Patient will identify signs/triggers that life has become out of balance:  Patient will identify two ways to set boundaries in order to achieve balance in their lives:  Patient will demonstrate ability to communicate their needs through discussion and/or role plays  Summary of Patient Progress:    Betty Henderson was active throughout group with good insight     Therapeutic Modalities:   Cognitive Behavioral Therapy Solution-Focused Therapy Assertiveness Training   Elza Rafter, LCSWA

## 2022-12-13 NOTE — Group Note (Signed)
Date:  12/13/2022 Time:  8:19 PM  Group Topic/Focus:  Managing Feelings:   The focus of this group is to identify what feelings patients have difficulty handling and develop a plan to handle them in a healthier way upon discharge.    Participation Level:  Active  Participation Quality:  Appropriate  Affect:  Appropriate  Cognitive:  Appropriate  Insight: Appropriate  Engagement in Group:  Engaged  Modes of Intervention:  Discussion  Additional Comments:    [name] 12/13/2022, 8:19 PM

## 2022-12-13 NOTE — Progress Notes (Signed)
Patient admitted [facility] on Aug 22, 2022 for worsening psychosis. She is now a voluntary admssion. Diagnosis of schizoaffective disorder, bipolar type. Patient is awaiting placement.  She denies SI/HI/AVH. AVH noted mainly when patient is in her room. Tylenol 650 mg adm PRN for 8/10 headache. Upon follow up, pain decreased to a 3.  Q15 minute unit checks in place.

## 2022-12-13 NOTE — Progress Notes (Signed)
[facility] MD Progress Note  Betty Henderson  MRN:  [id number]  Subjective: Case discussed in multidisciplinary meeting today, chart reviewed, patient seen today during rounds.  Social worker informed that patient has been assigned placement coordinator through DSS.   No behavioral issues reported by the staff.  Patient reports she is doing Ok today. She has been attending groups. She reports that her sleep and appetite is good.  Patient denies any thoughts of harming herself or others.  Patient denies auditory or visual hallucinations. Patient is awaiting placement.  Principal Problem: Schizoaffective disorder, bipolar type (HCC) Diagnosis: Principal Problem:   Schizoaffective disorder, bipolar type (HCC)   Past Psychiatric History: Schizoaffective disorder, bipolar type.  Past Medical History:  Past Medical History:  Diagnosis Date   Anemia    Arthritis    Chronic pain    Drug-seeking behavior    Malingering    Osteopetrosis    Psychosis (HCC)    Schizoaffective disorder, bipolar type (HCC)     Past Surgical History:  Procedure Laterality Date   MOUTH SURGERY     TUBAL LIGATION     Family History:  Family History  Family history unknown: Yes   Family Psychiatric  History: Unremarkable Social History:  Social History   Substance and Sexual Activity  Alcohol Use Not Currently   Comment: 1 cocktail 3 weeks ago     Social History   Substance and Sexual Activity  Drug Use Not Currently   Types: Cocaine   Comment: states "it's legal though"    Social History   Socioeconomic History   Marital status: Single    Spouse name: Not on file   Number of children: Not on file   Years of education: Not on file   Highest education level: Not on file  Occupational History   Not on file  Tobacco Use   Smoking status: Every Day    Current packs/day: 0.50    Types: Cigarettes   Smokeless tobacco: Never  Vaping Use   Vaping status: Never Used  Substance and Sexual  Activity   Alcohol use: Not Currently    Comment: 1 cocktail 3 weeks ago   Drug use: Not Currently    Types: Cocaine    Comment: states "it's legal though"   Sexual activity: Never  Other Topics Concern   Not on file  Social History Narrative   Not on file   Social Determinants of Health   Financial Resource Strain: Not on file  Food Insecurity: Food Insecurity Present (08/22/2022)   Hunger Vital Sign    Worried About Running Out of Food in the Last Year: Sometimes true    Ran Out of Food in the Last Year: Sometimes true  Transportation Needs: Patient Unable To Answer (08/22/2022)   PRAPARE - Transportation    Lack of Transportation (Medical): Patient unable to answer    Lack of Transportation (Non-Medical): Patient unable to answer  Recent Concern: Transportation Needs - Unmet Transportation Needs (06/13/2022)   Received from [facility]   [facility] - Transportation    Lack of Transportation (Medical): Not on file    Lack of Transportation (Non-Medical): Yes  Physical Activity: Not on file  Stress: Not on file  Social Connections: Unknown (08/06/2021)   Received from [facility], [facility]   Social Network    Social Network: Not on file   Additional Social History:  Sleep: Good  Appetite:  Good  Current Medications: Current Facility-Administered Medications  Medication Dose Route Frequency Provider Last Rate Last Admin   acetaminophen (TYLENOL) tablet 650 mg  650 mg Oral Q6H PRN [name], DO   650 mg at 12/13/22 1022   alum & mag hydroxide-simeth (MAALOX/MYLANTA) 200-200-20 MG/5ML suspension 30 mL  30 mL Oral Q4H PRN [name], DO   30 mL at 09/20/22 1142   diphenhydrAMINE (BENADRYL) capsule 50 mg  50 mg Oral Q6H PRN [name], DO   50 mg at 09/29/22 2254   Or   diphenhydrAMINE (BENADRYL) injection 50 mg  50 mg Intramuscular Q6H PRN [name], DO        haloperidol (HALDOL) tablet 5 mg  5 mg Oral Q6H PRN [name], DO   5 mg at 09/28/22 2149   Or   haloperidol lactate (HALDOL) injection 5 mg  5 mg Intramuscular Q6H PRN [name], DO       ibuprofen (ADVIL) tablet 600 mg  600 mg Oral Q6H PRN [name], [name], MD   600 mg at 12/10/22 2047   LORazepam (ATIVAN) tablet 1 mg  1 mg Oral TID PRN [name], DO   1 mg at 09/27/22 0025   magnesium hydroxide (MILK OF MAGNESIA) suspension 30 mL  30 mL Oral Daily PRN [name], DO   30 mL at 11/08/22 1012   traZODone (DESYREL) tablet 50 mg  50 mg Oral QHS PRN [name], DO   50 mg at 12/12/22 2115    Lab Results: No results found for this or any previous visit (from the past 48 hour(s)).  Blood Alcohol level:  Lab Results  Component Value Date   ETH <10 08/17/2022   ETH <10 01/11/2021      Musculoskeletal: Strength & Muscle Tone: within normal limits Gait & Station: normal Patient leans: N/A   Psychiatric Specialty Exam:   Presentation  General Appearance:  Casual; Neat   Eye Contact: Fair   Speech: Spontaneous   Speech Volume: Normal   Handedness: Right     Mood and Affect  Mood: "Ok"   Affect: Stable    Thought Processes: Improved,probably at baseline.    Descriptions of Associations: Intact   Orientation: Well-oriented   Thought Content:Denies SI/HI, at baseline   Hallucinations:Denies ,   Ideas of Reference:None noted   Suicidal Thoughts:Denies SI  Homicidal Thoughts:Denies HI   Sensorium  Memory: Immediate Fair; Remote Poor   Judgment: Fair, pt is Rx compliant   Insight: Limited     Executive Functions  Concentration: Improved   Attention Span: Improved   Language: Fair     Psychomotor Activity  Psychomotor Activity: Normal  Assets  Assets: Communication Skills     Sleep  Sleep:Improved     Physical Exam: Physical Exam Vitals and nursing note reviewed.   Constitutional:      Appearance: Normal appearance. She is normal weight.  Neurological:     General: No focal deficit present.     Mental Status: She is alert.      Review of Systems  Constitutional: Negative.   HENT: Negative.    Eyes: Negative.   Respiratory: Negative.    Cardiovascular: Negative.   Gastrointestinal: Negative.   Genitourinary: Negative.   Musculoskeletal: Negative.   Skin: Negative.   Neurological: Negative.   Endo/Heme/Allergies: Negative.     Blood pressure (!) 104/56, pulse 66, temperature (!) 97.5 F (  36.4 C), resp. rate 18, height 5\' 6"  (1.676 m), weight 64 kg, SpO2 99%. Body mass index is 22.76 kg/m.   Treatment Plan Summary: Daily contact with patient to assess and evaluate symptoms and progress in treatment, Medication management, and Plan continue current medications.  Continue to monitor patient on as needed meds Patient has received Invega Sustenna 156 mg IM on 11/13/2022, the previous dose was was given on 10/15/2022.   Patient received another dose of Invega Sustenna 156 mg IM on 11/18/2022  QTc interval 431, 11/27/22    [name], MD

## 2022-12-13 NOTE — Plan of Care (Signed)
Problem: Education: Goal: Knowledge of General Education information will improve Description Including pain rating scale, medication(s)/side effects and non-pharmacologic comfort measures Outcome: Progressing   Problem: Health Behavior/Discharge Planning: Goal: Ability to manage health-related needs will improve Outcome: Progressing

## 2022-12-13 NOTE — Plan of Care (Signed)
Problem: Education: Goal: Knowledge of General Education information will improve Description: Including pain rating scale, medication(s)/side effects and non-pharmacologic comfort measures Outcome: Progressing   Problem: Health Behavior/Discharge Planning: Goal: Ability to manage health-related needs will improve Outcome: Progressing   Problem: Clinical Measurements: Goal: Ability to maintain clinical measurements within normal limits will improve Outcome: Progressing Goal: Diagnostic test results will improve Outcome: Progressing   Problem: Nutrition: Goal: Adequate nutrition will be maintained Outcome: Progressing   Problem: Coping: Goal: Level of anxiety will decrease Outcome: Progressing   Problem: Elimination: Goal: Will not experience complications related to bowel motility Outcome: Progressing Goal: Will not experience complications related to urinary retention Outcome: Progressing   Problem: Pain Managment: Goal: General experience of comfort will improve Outcome: Progressing   Problem: Safety: Goal: Ability to remain free from injury will improve Outcome: Progressing   Problem: Skin Integrity: Goal: Risk for impaired skin integrity will decrease Outcome: Progressing

## 2022-12-14 DIAGNOSIS — F250 Schizoaffective disorder, bipolar type: Secondary | ICD-10-CM | POA: Diagnosis not present

## 2022-12-14 NOTE — Progress Notes (Signed)
   12/14/22 2100  Psych Admission Type (Psych Patients Only)  Admission Status Voluntary  Psychosocial Assessment  Patient Complaints None  Eye Contact Brief  Facial Expression Flat  Affect Flat  Speech Soft;Slurred  Interaction Minimal  Motor Activity Slow  Appearance/Hygiene Layered clothes  Behavior Characteristics Cooperative  Mood Pleasant  Thought Process  Coherency WDL  Content WDL  Delusions None reported or observed  Perception WDL  Hallucination Auditory;Visual  Judgment Impaired  Confusion Mild  Danger to Self  Current suicidal ideation? Denies  Agreement Not to Harm Self Yes  Description of Agreement verbal  Danger to Others  Danger to Others None reported or observed  Danger to Others Abnormal  Harmful Behavior to others No threats or harm toward other people  Destructive Behavior No threats or harm toward property

## 2022-12-14 NOTE — Progress Notes (Signed)
   12/14/22 0601  15 Minute Checks  Location Bedroom  Visual Appearance Calm  Behavior Sleeping  Sleep (Behavioral Health Patients Only)  Calculate sleep? (Click Yes once per 24 hr at 0600 safety check) Yes  Documented sleep last 24 hours 10.75

## 2022-12-14 NOTE — Plan of Care (Signed)
  Problem: Coping: Goal: Level of anxiety will decrease Outcome: Progressing   Problem: Safety: Goal: Ability to remain free from injury will improve Outcome: Progressing   Problem: Skin Integrity: Goal: Risk for impaired skin integrity will decrease Outcome: Progressing   

## 2022-12-14 NOTE — Group Note (Signed)
Date:  12/14/2022 Time:  8:24 PM  Group Topic/Focus:  Personal Choices and Values:   The focus of this group is to help patients assess and explore the importance of values in their lives, how their values affect their decisions, how they express their values and what opposes their expression.    Participation Level:  Active  Participation Quality:  Appropriate  Affect:  Appropriate  Cognitive:  Appropriate  Insight: Appropriate  Engagement in Group:  Engaged  Modes of Intervention:  Discussion  Additional Comments:    [name] 12/14/2022, 8:24 PM

## 2022-12-14 NOTE — Group Note (Signed)
Date:  12/14/2022 Time:  12:18 PM  Group Topic/Focus:  Coping With Mental Health Crisis:   The purpose of this group is to help patients identify strategies for coping with mental health crisis.  Group discusses possible causes of crisis and ways to manage them effectively. Crisis Planning:   The purpose of this group is to help patients create a crisis plan for use upon discharge or in the future, as needed. Overcoming Stress:   The focus of this group is to define stress and help patients assess their triggers. Anger Management   Participation Level:  Active  Participation Quality:  Appropriate and Attentive  Affect:  Appropriate  Cognitive:  Appropriate  Insight: Appropriate  Engagement in Group:  Engaged and Improving  Modes of Intervention:  Discussion and Education  Additional Comments:    Betty Henderson 12/14/2022, 12:18 PM

## 2022-12-14 NOTE — Progress Notes (Signed)
[facility] MD Progress Note  Betty Henderson  MRN:  [id number]  Subjective: Case discussed in multidisciplinary meeting today, chart reviewed, patient seen today during rounds.  Social worker informed that patient has been assigned placement coordinator through DSS.   No behavioral issues reported by the staff.  Patient reports she is doing Ok today. She has been attending groups. She reports that her sleep and appetite is good.  Patient denies any thoughts of harming herself or others.  Patient denies auditory or visual hallucinations. Patient is awaiting placement.  Principal Problem: Schizoaffective disorder, bipolar type (HCC) Diagnosis: Principal Problem:   Schizoaffective disorder, bipolar type (HCC)   Past Psychiatric History: Schizoaffective disorder, bipolar type.  Past Medical History:  Past Medical History:  Diagnosis Date   Anemia    Arthritis    Chronic pain    Drug-seeking behavior    Malingering    Osteopetrosis    Psychosis (HCC)    Schizoaffective disorder, bipolar type (HCC)     Past Surgical History:  Procedure Laterality Date   MOUTH SURGERY     TUBAL LIGATION     Family History:  Family History  Family history unknown: Yes   Family Psychiatric  History: Unremarkable Social History:  Social History   Substance and Sexual Activity  Alcohol Use Not Currently   Comment: 1 cocktail 3 weeks ago     Social History   Substance and Sexual Activity  Drug Use Not Currently   Types: Cocaine   Comment: states "it's legal though"    Social History   Socioeconomic History   Marital status: Single    Spouse name: Not on file   Number of children: Not on file   Years of education: Not on file   Highest education level: Not on file  Occupational History   Not on file  Tobacco Use   Smoking status: Every Day    Current packs/day: 0.50    Types: Cigarettes   Smokeless tobacco: Never  Vaping Use   Vaping status: Never Used  Substance and Sexual  Activity   Alcohol use: Not Currently    Comment: 1 cocktail 3 weeks ago   Drug use: Not Currently    Types: Cocaine    Comment: states "it's legal though"   Sexual activity: Never  Other Topics Concern   Not on file  Social History Narrative   Not on file   Social Determinants of Health   Financial Resource Strain: Not on file  Food Insecurity: Food Insecurity Present (08/22/2022)   Hunger Vital Sign    Worried About Running Out of Food in the Last Year: Sometimes true    Ran Out of Food in the Last Year: Sometimes true  Transportation Needs: Patient Unable To Answer (08/22/2022)   PRAPARE - Transportation    Lack of Transportation (Medical): Patient unable to answer    Lack of Transportation (Non-Medical): Patient unable to answer  Recent Concern: Transportation Needs - Unmet Transportation Needs (06/13/2022)   Received from [facility]   [facility] - Transportation    Lack of Transportation (Medical): Not on file    Lack of Transportation (Non-Medical): Yes  Physical Activity: Not on file  Stress: Not on file  Social Connections: Unknown (08/06/2021)   Received from [facility]   Social Network    Social Network: Not on file   Additional Social History:  Sleep: Good  Appetite:  Good  Current Medications: Current Facility-Administered Medications  Medication Dose Route Frequency Provider Last Rate Last Admin   acetaminophen (TYLENOL) tablet 650 mg  650 mg Oral Q6H PRN [name], DO   650 mg at 12/13/22 1022   alum & mag hydroxide-simeth (MAALOX/MYLANTA) 200-200-20 MG/5ML suspension 30 mL  30 mL Oral Q4H PRN [name], DO   30 mL at 09/20/22 1142   diphenhydrAMINE (BENADRYL) capsule 50 mg  50 mg Oral Q6H PRN [name], DO   50 mg at 09/29/22 2254   Or   diphenhydrAMINE (BENADRYL) injection 50 mg  50 mg Intramuscular Q6H PRN [name], DO        haloperidol (HALDOL) tablet 5 mg  5 mg Oral Q6H PRN [name], DO   5 mg at 09/28/22 2149   Or   haloperidol lactate (HALDOL) injection 5 mg  5 mg Intramuscular Q6H PRN [name], DO       ibuprofen (ADVIL) tablet 600 mg  600 mg Oral Q6H PRN [name], [name], MD   600 mg at 12/10/22 2047   LORazepam (ATIVAN) tablet 1 mg  1 mg Oral TID PRN [name], DO   1 mg at 09/27/22 0025   magnesium hydroxide (MILK OF MAGNESIA) suspension 30 mL  30 mL Oral Daily PRN [name], DO   30 mL at 11/08/22 1012   traZODone (DESYREL) tablet 50 mg  50 mg Oral QHS PRN [name], DO   50 mg at 12/13/22 2049    Lab Results: No results found for this or any previous visit (from the past 48 hour(s)).  Blood Alcohol level:  Lab Results  Component Value Date   ETH <10 08/17/2022   ETH <10 01/11/2021      Musculoskeletal: Strength & Muscle Tone: within normal limits Gait & Station: normal Patient leans: N/A   Psychiatric Specialty Exam:   Presentation  General Appearance:  Casual; Neat   Eye Contact: Fair   Speech: Spontaneous   Speech Volume: Normal   Handedness: Right     Mood and Affect  Mood: "Ok"   Affect: Stable    Thought Processes: Improved,probably at baseline.    Descriptions of Associations: Intact   Orientation: Well-oriented   Thought Content:Denies SI/HI, at baseline   Hallucinations:Denies ,   Ideas of Reference:None noted   Suicidal Thoughts:Denies SI  Homicidal Thoughts:Denies HI   Sensorium  Memory: Immediate Fair; Remote Poor   Judgment: Fair, pt is Rx compliant   Insight: Limited     Executive Functions  Concentration: Improved   Attention Span: Improved   Language: Fair     Psychomotor Activity  Psychomotor Activity: Normal  Assets  Assets: Communication Skills     Sleep  Sleep:Improved     Physical Exam: Physical Exam Vitals and nursing note reviewed.   Constitutional:      Appearance: Normal appearance. She is normal weight.  Neurological:     General: No focal deficit present.     Mental Status: She is alert.      Review of Systems  Constitutional: Negative.   HENT: Negative.    Eyes: Negative.   Respiratory: Negative.    Cardiovascular: Negative.   Gastrointestinal: Negative.   Genitourinary: Negative.   Musculoskeletal: Negative.   Skin: Negative.   Neurological: Negative.   Endo/Heme/Allergies: Negative.     Blood pressure 119/60, pulse 72, temperature (!) 97.2 F (36.2  C), resp. rate 17, height 5\' 6"  (1.676 m), weight 64 kg, SpO2 96%. Body mass index is 22.76 kg/m.   Treatment Plan Summary: Daily contact with patient to assess and evaluate symptoms and progress in treatment, Medication management, and Plan continue current medications.  Continue to monitor patient on as needed meds Patient has received Invega Sustenna 156 mg IM on 11/13/2022, the previous dose was was given on 10/15/2022.   Patient received another dose of Invega Sustenna 156 mg IM on 11/18/2022  QTc interval 431, 11/27/22    [name], MD

## 2022-12-14 NOTE — Group Note (Signed)
Date:  12/14/2022 Time:  5:16 PM  Group Topic/Focus:  Coping With Mental Health Crisis:   The purpose of this group is to help patients identify strategies for coping with mental health crisis.  Group discusses possible causes of crisis and ways to manage them effectively.  The pt went outside for some fresh air and talked about coping mechanisms and positive thoughts.    Participation Level:  Active  Participation Quality:  Appropriate  Affect:  Appropriate  Cognitive:  Appropriate  Insight: Appropriate  Engagement in Group:  Engaged and Improving  Modes of Intervention:  Discussion  Additional Comments:    Ziad Maye L Riot Barrick 12/14/2022, 5:16 PM

## 2022-12-14 NOTE — Progress Notes (Addendum)
Patient was an IVC admission on Aug 22, 2022 brought in for worsening psychosis and reports of aggression and agitation. She is now a voluntary admission awaiting placement. Diagnosis of schizoaffective disorder, bipolar type.  She denies SI/HI. Patient heard responding to internal stimuli but no unsafe behaviors noted. Patient prefers to isolate to her room but did appear for meals, snack, and group. There are no scheduled medications on this shift. No PRN's adm.  Q15 minute unit checks in place.

## 2022-12-15 DIAGNOSIS — F250 Schizoaffective disorder, bipolar type: Secondary | ICD-10-CM | POA: Diagnosis not present

## 2022-12-15 NOTE — Plan of Care (Signed)
  Problem: Education: Goal: Knowledge of General Education information will improve Description: Including pain rating scale, medication(s)/side effects and non-pharmacologic comfort measures Outcome: Progressing   Problem: Health Behavior/Discharge Planning: Goal: Ability to manage health-related needs will improve Outcome: Progressing   Problem: Clinical Measurements: Goal: Ability to maintain clinical measurements within normal limits will improve Outcome: Progressing Goal: Diagnostic test results will improve Outcome: Progressing   Problem: Nutrition: Goal: Adequate nutrition will be maintained Outcome: Progressing   Problem: Coping: Goal: Level of anxiety will decrease Outcome: Progressing   Problem: Elimination: Goal: Will not experience complications related to bowel motility Outcome: Progressing Goal: Will not experience complications related to urinary retention Outcome: Progressing   Problem: Pain Managment: Goal: General experience of comfort will improve Outcome: Progressing   Problem: Safety: Goal: Ability to remain free from injury will improve Outcome: Progressing   Problem: Skin Integrity: Goal: Risk for impaired skin integrity will decrease Outcome: Progressing

## 2022-12-15 NOTE — Progress Notes (Addendum)
Patient is a voluntary admission to [facility] for SAD bipolar type. Patient is calm, cooperative and comes out for meals and outdoor group activities otherwise she isolates in her room. She is pleasant with peers and staff. Denies SI, HI, AVH, Depression and Anxiety but is seen internally stimulated responding to voices and hallucinations. Walks without a walker, uses a depends for occasional incontinence.Will continue to monitor.

## 2022-12-15 NOTE — Group Note (Signed)
LCSW Group Therapy Note   Group Date: 12/15/2022 Start Time: 1308 End Time: 1340   Type of Therapy and Topic:  Group Therapy: Self-care Vs. Coping Skills  Participation Level:  Did Not Attend     Summary of Patient Progress:  The patient did not attend group.     Marshell Levan, LCSWA 12/15/2022  1:55 PM

## 2022-12-15 NOTE — Progress Notes (Signed)
   12/15/22 6578  15 Minute Checks  Location Bedroom  Visual Appearance Calm  Behavior Sleeping  Sleep (Behavioral Health Patients Only)  Calculate sleep? (Click Yes once per 24 hr at 0600 safety check) Yes  Documented sleep last 24 hours 12.25

## 2022-12-15 NOTE — Progress Notes (Signed)
Patient is alert and oriented. Pt reported some pain in her neck, which she rated at 3/10. Pt denies anxiety and depression. Pt denies SI/AVH at this time.   Pt is compliant with meds per MD orders. Pt is calm and cooperative. Pt is pleasant with staff and peers, came out for snack time but refused to join group , stays in her room most of the night. Routine safety checks conducted  q15 minutes.   No adverse drug reactions observed . Pt verbally contracts for safety at this time. Pt is compliant with medications and treatment plan . Pt interacts well with others on the unit. Pt remains safe at this time . Plan of care ongoing.

## 2022-12-15 NOTE — Group Note (Signed)
Date:  12/15/2022 Time:  11:04 AM  Group Topic/Focus:  Making Healthy Choices:   The focus of this group is to help patients identify negative/unhealthy choices they were using prior to admission and identify positive/healthier coping strategies to replace them upon discharge. Managing Feelings:   The focus of this group is to identify what feelings patients have difficulty handling and develop a plan to handle them in a healthier way upon discharge. Overcoming Stress:   The focus of this group is to define stress and help patients assess their triggers. Personal Choices and Values:   The focus of this group is to help patients assess and explore the importance of values in their lives, how their values affect their decisions, how they express their values and what opposes their expression. Self Care:   The focus of this group is to help patients understand the importance of self-care in order to improve or restore emotional, physical, spiritual, interpersonal, and financial health.    Participation Level:  Active  Participation Quality:  Appropriate  Affect:  Appropriate  Cognitive:  Alert  Insight: Appropriate  Engagement in Group:  Limited  Modes of Intervention:  Socialization and Support  Additional Comments: Patient participated appropriately in group discussion and activity (outdoor bowling)  Elysse Polidore l Mitzie Na 12/15/2022, 11:04 AM

## 2022-12-15 NOTE — Progress Notes (Signed)
[facility] MD Progress Note  Betty Henderson  MRN:  [id number]  Subjective: Case discussed in multidisciplinary meeting today, chart reviewed, patient seen today during rounds.  Social worker informed that patient has been assigned placement coordinator through DSS.   No behavioral issues reported by the staff.  Patient reports she is doing fine today. Patient attended outside recreation group this morning.. She reports that her sleep and appetite is good.  Patient denies any thoughts of harming herself or others.  Patient denies auditory or visual hallucinations. Patient is awaiting placement.  Principal Problem: Schizoaffective disorder, bipolar type (HCC) Diagnosis: Principal Problem:   Schizoaffective disorder, bipolar type (HCC)   Past Psychiatric History: Schizoaffective disorder, bipolar type.  Past Medical History:  Past Medical History:  Diagnosis Date   Anemia    Arthritis    Chronic pain    Drug-seeking behavior    Malingering    Osteopetrosis    Psychosis (HCC)    Schizoaffective disorder, bipolar type (HCC)     Past Surgical History:  Procedure Laterality Date   MOUTH SURGERY     TUBAL LIGATION     Family History:  Family History  Family history unknown: Yes   Family Psychiatric  History: Unremarkable Social History:  Social History   Substance and Sexual Activity  Alcohol Use Not Currently   Comment: 1 cocktail 3 weeks ago     Social History   Substance and Sexual Activity  Drug Use Not Currently   Types: Cocaine   Comment: states "it's legal though"    Social History   Socioeconomic History   Marital status: Single    Spouse name: Not on file   Number of children: Not on file   Years of education: Not on file   Highest education level: Not on file  Occupational History   Not on file  Tobacco Use   Smoking status: Every Day    Current packs/day: 0.50    Types: Cigarettes   Smokeless tobacco: Never  Vaping Use   Vaping status: Never Used   Substance and Sexual Activity   Alcohol use: Not Currently    Comment: 1 cocktail 3 weeks ago   Drug use: Not Currently    Types: Cocaine    Comment: states "it's legal though"   Sexual activity: Never  Other Topics Concern   Not on file  Social History Narrative   Not on file   Social Determinants of Health   Financial Resource Strain: Not on file  Food Insecurity: Food Insecurity Present (08/22/2022)   Hunger Vital Sign    Worried About Running Out of Food in the Last Year: Sometimes true    Ran Out of Food in the Last Year: Sometimes true  Transportation Needs: Patient Unable To Answer (08/22/2022)   PRAPARE - Transportation    Lack of Transportation (Medical): Patient unable to answer    Lack of Transportation (Non-Medical): Patient unable to answer  Recent Concern: Transportation Needs - Unmet Transportation Needs (06/13/2022)   Received from [facility]   [facility] - Transportation    Lack of Transportation (Medical): Not on file    Lack of Transportation (Non-Medical): Yes  Physical Activity: Not on file  Stress: Not on file  Social Connections: Unknown (08/06/2021)   Received from [facility], [facility]   Social Network    Social Network: Not on file   Additional Social History:  Sleep: Good  Appetite:  Good  Current Medications: Current Facility-Administered Medications  Medication Dose Route Frequency Provider Last Rate Last Admin   acetaminophen (TYLENOL) tablet 650 mg  650 mg Oral Q6H PRN [name], DO   650 mg at 12/13/22 1022   alum & mag hydroxide-simeth (MAALOX/MYLANTA) 200-200-20 MG/5ML suspension 30 mL  30 mL Oral Q4H PRN [name], DO   30 mL at 09/20/22 1142   diphenhydrAMINE (BENADRYL) capsule 50 mg  50 mg Oral Q6H PRN [name], DO   50 mg at 09/29/22 2254   Or   diphenhydrAMINE (BENADRYL) injection 50 mg  50 mg Intramuscular Q6H PRN [name], DO       haloperidol (HALDOL) tablet 5 mg  5 mg Oral Q6H PRN [name], DO   5 mg at 09/28/22 2149   Or   haloperidol lactate (HALDOL) injection 5 mg  5 mg Intramuscular Q6H PRN [name], DO       ibuprofen (ADVIL) tablet 600 mg  600 mg Oral Q6H PRN [name], [name], MD   600 mg at 12/10/22 2047   LORazepam (ATIVAN) tablet 1 mg  1 mg Oral TID PRN [name], DO   1 mg at 09/27/22 0025   magnesium hydroxide (MILK OF MAGNESIA) suspension 30 mL  30 mL Oral Daily PRN [name], DO   30 mL at 11/08/22 1012   traZODone (DESYREL) tablet 50 mg  50 mg Oral QHS PRN [name], DO   50 mg at 12/14/22 2100    Lab Results: No results found for this or any previous visit (from the past 48 hour(s)).  Blood Alcohol level:  Lab Results  Component Value Date   ETH <10 08/17/2022   ETH <10 01/11/2021      Musculoskeletal: Strength & Muscle Tone: within normal limits Gait & Station: normal Patient leans: N/A   Psychiatric Specialty Exam:   Presentation  General Appearance:  Casual; Neat   Eye Contact: Fair   Speech: Spontaneous   Speech Volume: Normal   Handedness: Right     Mood and Affect  Mood: "Ok"   Affect: Stable    Thought Processes: Improved,probably at baseline.    Descriptions of Associations: Intact   Orientation: Well-oriented   Thought Content:Denies SI/HI, at baseline   Hallucinations:Denies ,   Ideas of Reference:None noted   Suicidal Thoughts:Denies SI  Homicidal Thoughts:Denies HI   Sensorium  Memory: Immediate Fair; Remote Poor   Judgment: Fair, pt is Rx compliant   Insight: Limited     Executive Functions  Concentration: Improved   Attention Span: Improved   Language: Fair     Psychomotor Activity  Psychomotor Activity: Normal  Assets  Assets: Communication Skills     Sleep  Sleep:Improved     Physical Exam: Physical Exam Vitals and  nursing note reviewed.  Constitutional:      Appearance: Normal appearance. She is normal weight.  Neurological:     General: No focal deficit present.     Mental Status: She is alert.      Review of Systems  Constitutional: Negative.   HENT: Negative.    Eyes: Negative.   Respiratory: Negative.    Cardiovascular: Negative.   Gastrointestinal: Negative.   Genitourinary: Negative.   Musculoskeletal: Negative.   Skin: Negative.   Neurological: Negative.   Endo/Heme/Allergies: Negative.     Blood pressure (!) 144/89, pulse (!) 114, temperature 97.8 F (  36.6 C), resp. rate 16, height 5\' 6"  (1.676 m), weight 64 kg, SpO2 100%. Body mass index is 22.76 kg/m.   Treatment Plan Summary: Daily contact with patient to assess and evaluate symptoms and progress in treatment, Medication management, and Plan continue current medications.  Continue to monitor patient on as needed meds Patient has received Invega Sustenna 156 mg IM on 11/13/2022, the previous dose was was given on 10/15/2022.   Patient received another dose of Invega Sustenna 156 mg IM on 11/18/2022  QTc interval 431, 11/27/22    [name], MD

## 2022-12-15 NOTE — Group Note (Signed)
  Date:  12/15/2022 Time:  11:38 PM  Group Topic/Focus:  Making Healthy Choices:   The focus of this group is to help patients identify negative/unhealthy choices they were using prior to admission and identify positive/healthier coping strategies to replace them upon discharge.    Participation Level:  Did Not Attend  Participation Quality:      Affect:      Cognitive:      Insight: None  Engagement in Group:  None  Modes of Intervention:      Additional Comments:    [name] 12/15/2022, 11:38 PM

## 2022-12-16 DIAGNOSIS — F250 Schizoaffective disorder, bipolar type: Secondary | ICD-10-CM | POA: Diagnosis not present

## 2022-12-16 NOTE — Plan of Care (Signed)
  Problem: Education: Goal: Knowledge of General Education information will improve Description: Including pain rating scale, medication(s)/side effects and non-pharmacologic comfort measures Outcome: Progressing   Problem: Coping: Goal: Level of anxiety will decrease Outcome: Progressing   Problem: Pain Managment: Goal: General experience of comfort will improve Outcome: Progressing   Problem: Skin Integrity: Goal: Risk for impaired skin integrity will decrease Outcome: Progressing

## 2022-12-16 NOTE — Group Note (Signed)
Date:  12/16/2022 Time:  12:21 PM  Group Topic/Focus:  Emotional Education:   The focus of this group is to discuss what feelings/emotions are, and how they are experienced.    Participation Level:  Active  Participation Quality:  Attentive  Affect:  Appropriate  Cognitive:  Alert  Insight: Appropriate  Engagement in Group:  Engaged  Modes of Intervention:  Education  Additional Comments:    Ardelle Anton 12/16/2022, 12:21 PM

## 2022-12-16 NOTE — Group Note (Signed)
[facility] LCSW Group Therapy Note   Group Date: 12/16/2022 Start Time: 1315 End Time: 1400  Type of Therapy/Topic:  Group Therapy:  Feelings about Diagnosis  Participation Level:  Active   Mood: Calm   Description of Group:    This group will allow patients to explore their thoughts and feelings about diagnoses they have received. Patients will be guided to explore their level of understanding and acceptance of these diagnoses. Facilitator will encourage patients to process their thoughts and feelings about the reactions of others to their diagnosis, and will guide patients in identifying ways to discuss their diagnosis with significant others in their lives. This group will be process-oriented, with patients participating in exploration of their own experiences as well as giving and receiving support and challenge from other group members.   Therapeutic Goals: 1. Patient will demonstrate understanding of diagnosis as evidence by identifying two or more symptoms of the disorder:  2. Patient will be able to express two feelings regarding the diagnosis 3. Patient will demonstrate ability to communicate their needs through discussion and/or role plays  Summary of Patient Progress:    [name] participated in group throughout and had fair insight into topic     Therapeutic Modalities:   Cognitive Behavioral Therapy Brief Therapy Feelings Identification    Elza Rafter, LCSWA

## 2022-12-16 NOTE — Group Note (Deleted)
Date:  12/16/2022 Time:  11:59 AM  Group Topic/Focus:  Emotional Education:   The focus of this group is to discuss what feelings/emotions are, and how they are experienced. Wellness Toolbox:   The focus of this group is to discuss various aspects of wellness, balancing those aspects and exploring ways to increase the ability to experience wellness.  Patients will create a wellness toolbox for use upon discharge.     Participation Level:  {BHH PARTICIPATION ZOXWR:60454}  Participation Quality:  {BHH PARTICIPATION QUALITY:22265}  Affect:  {BHH AFFECT:22266}  Cognitive:  {BHH COGNITIVE:22267}  Insight: {BHH Insight2:20797}  Engagement in Group:  {BHH ENGAGEMENT IN UJWJX:91478}  Modes of Intervention:  {BHH MODES OF INTERVENTION:22269}  Additional Comments:  ***  Ardelle Anton 12/16/2022, 11:59 AM

## 2022-12-16 NOTE — Progress Notes (Signed)
[facility] MD Progress Note  Betty Henderson  MRN:  [id number]  Subjective: Case discussed in multidisciplinary meeting today, chart reviewed, patient seen today during rounds.  Social worker informed that patient has been assigned placement coordinator through DSS.   No behavioral issues reported by the staff.  Patient reports she is doing fine today.  Patient today said" I want to go home, when can I go home".  Patient was provided with support and reassurance.  She was informed that Child psychotherapist is working with DSS to find a place for the patient. She reports that her sleep and appetite is good.  Patient denies any thoughts of harming herself or others.  Patient denies auditory or visual hallucinations. Patient is awaiting placement.  Principal Problem: Schizoaffective disorder, bipolar type (HCC) Diagnosis: Principal Problem:   Schizoaffective disorder, bipolar type (HCC)   Past Psychiatric History: Schizoaffective disorder, bipolar type.  Past Medical History:  Past Medical History:  Diagnosis Date   Anemia    Arthritis    Chronic pain    Drug-seeking behavior    Malingering    Osteopetrosis    Psychosis (HCC)    Schizoaffective disorder, bipolar type (HCC)     Past Surgical History:  Procedure Laterality Date   MOUTH SURGERY     TUBAL LIGATION     Family History:  Family History  Family history unknown: Yes   Family Psychiatric  History: Unremarkable Social History:  Social History   Substance and Sexual Activity  Alcohol Use Not Currently   Comment: 1 cocktail 3 weeks ago     Social History   Substance and Sexual Activity  Drug Use Not Currently   Types: Cocaine   Comment: states "it's legal though"    Social History   Socioeconomic History   Marital status: Single    Spouse name: Not on file   Number of children: Not on file   Years of education: Not on file   Highest education level: Not on file  Occupational History   Not on file  Tobacco Use    Smoking status: Every Day    Current packs/day: 0.50    Types: Cigarettes   Smokeless tobacco: Never  Vaping Use   Vaping status: Never Used  Substance and Sexual Activity   Alcohol use: Not Currently    Comment: 1 cocktail 3 weeks ago   Drug use: Not Currently    Types: Cocaine    Comment: states "it's legal though"   Sexual activity: Never  Other Topics Concern   Not on file  Social History Narrative   Not on file   Social Determinants of Health   Financial Resource Strain: Not on file  Food Insecurity: Food Insecurity Present (08/22/2022)   Hunger Vital Sign    Worried About Running Out of Food in the Last Year: Sometimes true    Ran Out of Food in the Last Year: Sometimes true  Transportation Needs: Patient Unable To Answer (08/22/2022)   PRAPARE - Transportation    Lack of Transportation (Medical): Patient unable to answer    Lack of Transportation (Non-Medical): Patient unable to answer  Recent Concern: Transportation Needs - Unmet Transportation Needs (06/13/2022)   Received from [facility], [facility]   [facility] - Transportation    Lack of Transportation (Medical): Not on file    Lack of Transportation (Non-Medical): Yes  Physical Activity: Not on file  Stress: Not on file  Social Connections: Unknown (08/06/2021)   Received from [facility],  [facility]   Social Network    Social Network: Not on file   Additional Social History:                         Sleep: Good  Appetite:  Good  Current Medications: Current Facility-Administered Medications  Medication Dose Route Frequency Provider Last Rate Last Admin   acetaminophen (TYLENOL) tablet 650 mg  650 mg Oral Q6H PRN [name], DO   650 mg at 12/15/22 2055   alum & mag hydroxide-simeth (MAALOX/MYLANTA) 200-200-20 MG/5ML suspension 30 mL  30 mL Oral Q4H PRN [name], DO   30 mL at 09/20/22 1142   diphenhydrAMINE (BENADRYL) capsule 50 mg  50 mg Oral Q6H PRN [name], DO   50 mg at 09/29/22 2254   Or   diphenhydrAMINE (BENADRYL) injection 50 mg  50 mg Intramuscular Q6H PRN [name], DO       haloperidol (HALDOL) tablet 5 mg  5 mg Oral Q6H PRN [name], DO   5 mg at 09/28/22 2149   Or   haloperidol lactate (HALDOL) injection 5 mg  5 mg Intramuscular Q6H PRN [name], DO       ibuprofen (ADVIL) tablet 600 mg  600 mg Oral Q6H PRN [name], [name], MD   600 mg at 12/10/22 2047   LORazepam (ATIVAN) tablet 1 mg  1 mg Oral TID PRN [name], DO   1 mg at 09/27/22 0025   magnesium hydroxide (MILK OF MAGNESIA) suspension 30 mL  30 mL Oral Daily PRN [name], DO   30 mL at 11/08/22 1012   traZODone (DESYREL) tablet 50 mg  50 mg Oral QHS PRN [name], DO   50 mg at 12/15/22 2056    Lab Results: No results found for this or any previous visit (from the past 48 hour(s)).  Blood Alcohol level:  Lab Results  Component Value Date   ETH <10 08/17/2022   ETH <10 01/11/2021      Musculoskeletal: Strength & Muscle Tone: within normal limits Gait & Station: normal Patient leans: N/A   Psychiatric Specialty Exam:   Presentation  General Appearance:  Casual; Neat   Eye Contact: Fair   Speech: Spontaneous   Speech Volume: Normal   Handedness: Right     Mood and Affect  Mood: "Ok"   Affect: Stable    Thought Processes: Improved,probably at baseline.    Descriptions of Associations: Intact   Orientation: Well-oriented   Thought Content:Denies SI/HI, at baseline   Hallucinations:Denies ,   Ideas of Reference:None noted   Suicidal Thoughts:Denies SI  Homicidal Thoughts:Denies HI   Sensorium  Memory: Immediate Fair; Remote Poor   Judgment: Fair, pt is Rx compliant   Insight: Limited     Executive Functions  Concentration: Improved   Attention Span: Improved   Language: Fair     Psychomotor Activity  Psychomotor  Activity: Normal  Assets  Assets: Communication Skills     Sleep  Sleep:Improved     Physical Exam: Physical Exam Vitals and nursing note reviewed.  Constitutional:      Appearance: Normal appearance. She is normal weight.  Neurological:     General: No focal deficit present.     Mental Status: She is alert.      Review of Systems  Constitutional: Negative.   HENT: Negative.    Eyes: Negative.   Respiratory: Negative.  Cardiovascular: Negative.   Gastrointestinal: Negative.   Genitourinary: Negative.   Musculoskeletal: Negative.   Skin: Negative.   Neurological: Negative.   Endo/Heme/Allergies: Negative.     Blood pressure 117/69, pulse 82, temperature (!) 97.5 F (36.4 C), resp. rate 18, height 5\' 6"  (1.676 m), weight (P) 82.8 kg, SpO2 97%. Body mass index is 29.46 kg/m (pended).   Treatment Plan Summary: Daily contact with patient to assess and evaluate symptoms and progress in treatment, Medication management, and Plan continue current medications.  Continue to monitor patient on as needed meds Patient has received Invega Sustenna 156 mg IM on 11/13/2022, the previous dose was was given on 10/15/2022.   Patient received another dose of Invega Sustenna 156 mg IM on 11/18/2022  QTc interval 431, 11/27/22    [name], MD

## 2022-12-16 NOTE — Progress Notes (Signed)
Patient pleasant and cooperative.  Flat affect.  Present in the dayroom for breakfast.  Patient reports she slept well.  Good appetite. Denies SI/HI and AVH.  Denies anxiety and depression.  Not observed responding to internal stimuli.     No scheduled mediations.  15 min checks in place for safety.  Minimal presence  in the milieu other than meals.  Minimal interaction with peers and staff.  Attended MHT, RT and SW groups.

## 2022-12-16 NOTE — Group Note (Signed)
Recreation Therapy Group Note   Group Topic:Communication  Group Date: 12/16/2022 Start Time: 1400 End Time: 1450 Facilitators: Rosina Lowenstein, LRT, CTRS Location: Courtyard  Group Description: Emotional Check in. Patient sat and talked with LRT about how they are doing and whatever else is on their mind. LRT provided active listening, reassurance and encouragement. Pts were given the opportunity to listen to music or play cornhole while getting fresh air and sunlight in the courtyard.    Goal Area(s) Addressed: Patient will engage in conversation with LRT. Patient will communicate their wants, needs, or questions.  Patient will practice a new coping skill of "talking to someone".   Affect/Mood: Appropriate and Flat   Participation Level: Active and Engaged   Participation Quality: Independent   Behavior: Appropriate, Calm, and Cooperative   Speech/Thought Process: Coherent   Insight: Fair   Judgement: Good   Modes of Intervention: Open Conversation   Patient Response to Interventions:  Interested  and Receptive   Education Outcome:  Acknowledges education   Clinical Observations/Individualized Feedback: Betty Henderson was active in their participation of session activities and group discussion. Pt requested to hear multiple songs by Donn Pierini and shared that she wanted to go to [employer]. Pt asked if her nails could be cut after group. Pt interacted well with LRT and peers duration of session.   Plan: Continue to engage patient in RT group sessions 2-3x/week.   Rosina Lowenstein, LRT, CTRS 12/16/2022 3:18 PM

## 2022-12-16 NOTE — Group Note (Signed)
Date:  12/16/2022 Time:  8:38 PM  Group Topic/Focus:  Healthy Communication:   The focus of this group is to discuss communication, barriers to communication, as well as healthy ways to communicate with others.    Participation Level:  Active  Participation Quality:  Appropriate  Affect:  Appropriate  Cognitive:  Appropriate  Insight: Appropriate  Engagement in Group:  Engaged  Modes of Intervention:  Clarification  Additional Comments:    [name] 12/16/2022, 8:38 PM

## 2022-12-17 DIAGNOSIS — F250 Schizoaffective disorder, bipolar type: Secondary | ICD-10-CM | POA: Diagnosis not present

## 2022-12-17 NOTE — Progress Notes (Signed)
[facility] MD Progress Note  12/17/2022 1:26 PM Betty Henderson  MRN:  [id number] Subjective: Betty Henderson is seen on rounds.  She continues to take her medications as prescribed and denies any side effects.  No evidence of EPS or TD.  Social work continues to work with DSS to find housing.  She has no complaints. Principal Problem: Schizoaffective disorder, bipolar type (HCC) Diagnosis: Principal Problem:   Schizoaffective disorder, bipolar type (HCC)  Total Time spent with patient: 15 minutes  Past Psychiatric History: Long history of schizoaffective disorder  Past Medical History:  Past Medical History:  Diagnosis Date   Anemia    Arthritis    Chronic pain    Drug-seeking behavior    Malingering    Osteopetrosis    Psychosis (HCC)    Schizoaffective disorder, bipolar type (HCC)     Past Surgical History:  Procedure Laterality Date   MOUTH SURGERY     TUBAL LIGATION     Family History:  Family History  Family history unknown: Yes   Family Psychiatric  History: Unremarkable Social History:  Social History   Substance and Sexual Activity  Alcohol Use Not Currently   Comment: 1 cocktail 3 weeks ago     Social History   Substance and Sexual Activity  Drug Use Not Currently   Types: Cocaine   Comment: states "it's legal though"    Social History   Socioeconomic History   Marital status: Single    Spouse name: Not on file   Number of children: Not on file   Years of education: Not on file   Highest education level: Not on file  Occupational History   Not on file  Tobacco Use   Smoking status: Every Day    Current packs/day: 0.50    Types: Cigarettes   Smokeless tobacco: Never  Vaping Use   Vaping status: Never Used  Substance and Sexual Activity   Alcohol use: Not Currently    Comment: 1 cocktail 3 weeks ago   Drug use: Not Currently    Types: Cocaine    Comment: states "it's legal though"   Sexual activity: Never  Other Topics Concern   Not on file  Social  History Narrative   Not on file   Social Determinants of Health   Financial Resource Strain: Not on file  Food Insecurity: Food Insecurity Present (08/22/2022)   Hunger Vital Sign    Worried About Running Out of Food in the Last Year: Sometimes true    Ran Out of Food in the Last Year: Sometimes true  Transportation Needs: Patient Unable To Answer (08/22/2022)   PRAPARE - Transportation    Lack of Transportation (Medical): Patient unable to answer    Lack of Transportation (Non-Medical): Patient unable to answer  Recent Concern: Transportation Needs - Unmet Transportation Needs (06/13/2022)   Received from [facility], [facility]   [facility] - Transportation    Lack of Transportation (Medical): Not on file    Lack of Transportation (Non-Medical): Yes  Physical Activity: Not on file  Stress: Not on file  Social Connections: Unknown (08/06/2021)   Received from [facility], [facility]   Social Network    Social Network: Not on file   Additional Social History:                         Sleep: Good  Appetite:  Good  Current Medications: Current Facility-Administered Medications  Medication Dose Route Frequency Provider Last  Rate Last Admin   acetaminophen (TYLENOL) tablet 650 mg  650 mg Oral Q6H PRN [name], DO   650 mg at 12/15/22 2055   alum & mag hydroxide-simeth (MAALOX/MYLANTA) 200-200-20 MG/5ML suspension 30 mL  30 mL Oral Q4H PRN [name], DO   30 mL at 09/20/22 1142   diphenhydrAMINE (BENADRYL) capsule 50 mg  50 mg Oral Q6H PRN [name], DO   50 mg at 09/29/22 2254   Or   diphenhydrAMINE (BENADRYL) injection 50 mg  50 mg Intramuscular Q6H PRN [name], DO       haloperidol (HALDOL) tablet 5 mg  5 mg Oral Q6H PRN [name], DO   5 mg at 09/28/22 2149   Or   haloperidol lactate (HALDOL) injection 5 mg  5 mg Intramuscular Q6H PRN [name], DO       ibuprofen (ADVIL)  tablet 600 mg  600 mg Oral Q6H PRN [name], [name], MD   600 mg at 12/16/22 2107   LORazepam (ATIVAN) tablet 1 mg  1 mg Oral TID PRN [name], DO   1 mg at 09/27/22 0025   magnesium hydroxide (MILK OF MAGNESIA) suspension 30 mL  30 mL Oral Daily PRN [name], DO   30 mL at 11/08/22 1012   traZODone (DESYREL) tablet 50 mg  50 mg Oral QHS PRN [name], DO   50 mg at 12/16/22 2107    Lab Results: No results found for this or any previous visit (from the past 48 hour(s)).  Blood Alcohol level:  Lab Results  Component Value Date   ETH <10 08/17/2022   ETH <10 01/11/2021    Metabolic Disorder Labs: No results found for: "HGBA1C", "MPG" No results found for: "PROLACTIN" No results found for: "CHOL", "TRIG", "HDL", "CHOLHDL", "VLDL", "LDLCALC"  Physical Findings: AIMS:  , ,  ,  ,    CIWA:    COWS:     Musculoskeletal: Strength & Muscle Tone: within normal limits Gait & Station: normal Patient leans: N/A  Psychiatric Specialty Exam:  Presentation  General Appearance:  Casual; Neat  Eye Contact: Fair  Speech: Slow; Slurred  Speech Volume: Decreased  Handedness: Right   Mood and Affect  Mood: Anxious  Affect: Inappropriate   Thought Process  Thought Processes: Disorganized  Descriptions of Associations:Loose  Orientation:Partial  Thought Content:Illogical; Rumination  History of Schizophrenia/Schizoaffective disorder:No data recorded Duration of Psychotic Symptoms:No data recorded Hallucinations:No data recorded Ideas of Reference:Paranoia  Suicidal Thoughts:No data recorded Homicidal Thoughts:No data recorded  Sensorium  Memory: Immediate Fair; Remote Poor  Judgment: Poor  Insight: Poor   Executive Functions  Concentration: Poor  Attention Span: Fair  Recall: [employer] of Knowledge: Fair  Language: Fair   Psychomotor Activity  Psychomotor Activity:No data recorded  Assets   Assets: Communication Skills   Sleep  Sleep:No data recorded    Blood pressure 122/75, pulse 66, temperature 97.7 F (36.5 C), resp. rate 15, height 5\' 6"  (1.676 m), weight (P) 82.8 kg, SpO2 97%. Body mass index is 29.46 kg/m (pended).   Treatment Plan Summary: Daily contact with patient to assess and evaluate symptoms and progress in treatment, Medication management, and Plan [facility] Sustenna 156 tomorrow.  Betty Henderson [place]sea Mall, DO 12/17/2022, 1:26 PM

## 2022-12-17 NOTE — Progress Notes (Signed)

## 2022-12-17 NOTE — Progress Notes (Signed)
Recreation Therapy Notes  LRT provided Christiana with nail care.     Rosina Lowenstein 12/17/2022 8:36 AM

## 2022-12-17 NOTE — BH IP Treatment Plan (Signed)
Interdisciplinary Treatment and Diagnostic Plan Update  12/17/2022 Time of Session: 9:30 AM  Betty Henderson MRN: [id number]  Principal Diagnosis: Schizoaffective disorder, bipolar type (HCC)  Secondary Diagnoses: Principal Problem:   Schizoaffective disorder, bipolar type (HCC)   Current Medications:  Current Facility-Administered Medications  Medication Dose Route Frequency Provider Last Rate Last Admin   acetaminophen (TYLENOL) tablet 650 mg  650 mg Oral Q6H PRN [name], DO   650 mg at 12/15/22 2055   alum & mag hydroxide-simeth (MAALOX/MYLANTA) 200-200-20 MG/5ML suspension 30 mL  30 mL Oral Q4H PRN [name], DO   30 mL at 09/20/22 1142   diphenhydrAMINE (BENADRYL) capsule 50 mg  50 mg Oral Q6H PRN [name], DO   50 mg at 09/29/22 2254   Or   diphenhydrAMINE (BENADRYL) injection 50 mg  50 mg Intramuscular Q6H PRN [name], DO       haloperidol (HALDOL) tablet 5 mg  5 mg Oral Q6H PRN [name], DO   5 mg at 09/28/22 2149   Or   haloperidol lactate (HALDOL) injection 5 mg  5 mg Intramuscular Q6H PRN [name], DO       ibuprofen (ADVIL) tablet 600 mg  600 mg Oral Q6H PRN [name], [name], MD   600 mg at 12/16/22 2107   LORazepam (ATIVAN) tablet 1 mg  1 mg Oral TID PRN [name], DO   1 mg at 09/27/22 0025   magnesium hydroxide (MILK OF MAGNESIA) suspension 30 mL  30 mL Oral Daily PRN [name], DO   30 mL at 11/08/22 1012   traZODone (DESYREL) tablet 50 mg  50 mg Oral QHS PRN [name], DO   50 mg at 12/16/22 2107   PTA Medications: Medications Prior to Admission  Medication Sig Dispense Refill Last Dose   ondansetron (ZOFRAN) 4 MG tablet Take 1 tablet (4 mg total) by mouth every 8 (eight) hours as needed for nausea or vomiting. (Patient not taking: Reported on 08/17/2022) 20 tablet 0     Patient Stressors: Financial difficulties   Health problems    Medication change or noncompliance    Patient Strengths: Ability for insight   Treatment Modalities: Medication Management, Group therapy, Case management,  1 to 1 session with clinician, Psychoeducation, Recreational therapy.   Physician Treatment Plan for Primary Diagnosis: Schizoaffective disorder, bipolar type (HCC) Long Term Goal(s): Improvement in symptoms so as ready for discharge   Short Term Goals: Ability to identify changes in lifestyle to reduce recurrence of condition will improve Ability to verbalize feelings will improve Ability to disclose and discuss suicidal ideas Ability to demonstrate self-control will improve Ability to identify and develop effective coping behaviors will improve Ability to maintain clinical measurements within normal limits will improve Compliance with prescribed medications will improve Ability to identify triggers associated with substance abuse/mental health issues will improve  Medication Management: Evaluate patient's response, side effects, and tolerance of medication regimen.  Therapeutic Interventions: 1 to 1 sessions, Unit Group sessions and Medication administration.  Evaluation of Outcomes: Progressing  Physician Treatment Plan for Secondary Diagnosis: Principal Problem:   Schizoaffective disorder, bipolar type (HCC)  Long Term Goal(s): Improvement in symptoms so as ready for discharge   Short Term Goals: Ability to identify changes in lifestyle to reduce recurrence of condition will improve Ability to verbalize feelings will improve Ability to disclose and discuss suicidal ideas Ability to demonstrate self-control will improve Ability to identify  and develop effective coping behaviors will improve Ability to maintain clinical measurements within normal limits will improve Compliance with prescribed medications will improve Ability to identify triggers associated with substance abuse/mental health issues will improve      Medication Management: Evaluate patient's response, side effects, and tolerance of medication regimen.  Therapeutic Interventions: 1 to 1 sessions, Unit Group sessions and Medication administration.  Evaluation of Outcomes: Progressing   RN Treatment Plan for Primary Diagnosis: Schizoaffective disorder, bipolar type (HCC) Long Term Goal(s): Knowledge of disease and therapeutic regimen to maintain health will improve  Short Term Goals: Ability to remain free from injury will improve, Ability to verbalize frustration and anger appropriately will improve, Ability to demonstrate self-control, Ability to participate in decision making will improve, Ability to verbalize feelings will improve, Ability to disclose and discuss suicidal ideas, Ability to identify and develop effective coping behaviors will improve, and Compliance with prescribed medications will improve  Medication Management: RN will administer medications as ordered by provider, will assess and evaluate patient's response and provide education to patient for prescribed medication. RN will report any adverse and/or side effects to prescribing provider.  Therapeutic Interventions: 1 on 1 counseling sessions, Psychoeducation, Medication administration, Evaluate responses to treatment, Monitor vital signs and CBGs as ordered, Perform/monitor CIWA, COWS, AIMS and Fall Risk screenings as ordered, Perform wound care treatments as ordered.  Evaluation of Outcomes: Progressing   LCSW Treatment Plan for Primary Diagnosis: Schizoaffective disorder, bipolar type (HCC) Long Term Goal(s): Safe transition to appropriate next level of care at discharge, Engage patient in therapeutic group addressing interpersonal concerns.  Short Term Goals: Engage patient in aftercare planning with referrals and resources, Increase social support, Increase ability to appropriately verbalize feelings, Increase emotional regulation, Facilitate acceptance of mental  health diagnosis and concerns, Facilitate patient progression through stages of change regarding substance use diagnoses and concerns, Identify triggers associated with mental health/substance abuse issues, and Increase skills for wellness and recovery  Therapeutic Interventions: Assess for all discharge needs, 1 to 1 time with Social worker, Explore available resources and support systems, Assess for adequacy in community support network, Educate family and significant other(s) on suicide prevention, Complete Psychosocial Assessment, Interpersonal group therapy.  Evaluation of Outcomes: Progressing    Progress in Treatment: Attending groups: Yes. and No. Participating in groups: Yes. and No. Taking medication as prescribed: Yes. Toleration medication: Yes. Family/Significant other contact made: Yes, individual(s) contacted:  Kseniya Sweezy [phone] Patient understands diagnosis: Yes. Discussing patient identified problems/goals with staff: Yes. Medical problems stabilized or resolved: Yes. Denies suicidal/homicidal ideation: Yes. Issues/concerns per patient self-inventory: No. Other: None    New problem(s) identified: No, Describe:  none Updates 12/12/22: No changes made at this time    New Short Term/Long Term Goal(s): Update 6/30: none at this time. Update 09/27/2022:  No changes at this time.  Update 10/02/2022:  No changes at this time. Update 10/07/22: No changes at this time 10/12/22: No changes at this time Update 10/18/22: No changes at this time Update 10/23/22: No changes at this time Update 10/28/22: No changes at this time Update 11/07/22: No changes at this time Update 11/12/22: No changes at this time  Update 11/17/22: None at this time. Update 11/22/22: None at this time. Update 11/27/22 No changes at this time. Update 12/02/22 No changes at this time  Update 12/07/22: None at this time. Updates 12/12/22: No changes made at this time    Patient Goals:  Update 6/30: none at this time.  Update 09/27/2022:  No changes at this time. Update 10/02/2022:  No changes at this time. Update 10/07/22: No changes at this time 10/12/22: No changes at this time Update 10/18/22: No changes at this time Update 10/23/22: No changes at this time Update 10/28/22: No changes at this time Update 11/07/22: No changes at this time Update 11/12/22: No changes at this time Update 11/17/22: None at this time. Update 11/22/22: None at this time. Update 11/27/22 No changes at this time Update 12/02/22 No changes at this time   Update 12/07/22: None at this time. Updates 12/12/22: No changes made at this time Updates 12/17/22: No changes made at this time      Discharge Plan or Barriers: Update 6/30: APS report has been made and patient is being investigated for guardianship needs.  Remains homeless with limited supports. Update 09/27/2022:  No changes at this time.  Update 10/02/2022:  Patient remains safe on the unit at this time.  Patient remains psychotic at this time.  APS report has been made, however, no follow up from the caseworker on her case.  No safe discharge identified.  CSW has requested that application for Medicaid and disability be completed.   Update 10/07/22: No changes at this time 10/12/22: No changes at this time Update 10/18/22: No changes at this time Update 10/23/22: No changes at this time Update 10/23/22: No changes at this time Update 10/28/22: According to [facility], pt's caseworker at DSS, the petition was filed for guardianship on 10/21/22, now awaiting for the petition to be approvedUpdate 11/07/22: No changes at this time Update 11/12/22: CSW sent over patients information to [facility] and [facility], awaiting a response. CSW awaiting word from DSS regarding pt's petition and what agency will become her guardian. Update 11/17/22: No changes at this time. Update 11/22/22: CSW has sent patients information to multiple facilities that were suggested by leadership, pt has been denied from the facility or the facility  does not respond. CSW continues to send pt's information to nursing homes. Update 11/27/22 CSW contacted DSS to inquire about the status of guardianship. No response at this time Update 12/02/22 Supervisor Loraine Leriche contacted DSS who stated they are not taking pt on for guardianship but that Quincy Carnes will be present tomorrow to do a visit with social worker and pt and that pt's son will be signing documentation for placement for the pt. Update 12/07/22: [facility] DSS has declined to take guardianship. Son, Casimiro Needle is to step into a more present role in deciding placement. A conversation is still needed. Updates 12/12/22: CSW staffed case with leadership and they state that a family meeting must happen with pt's son. CSW contacted pt's financial navigator and her DSS worker so that they may be present on the call and are able to speak to the efforts they have made to secure pt funding and housing. CSW waiting on responses from both, leadership is aware. Updates 12/17/22: CSW awaiting updates from both [facility] DSS and [facility] financial navigators as to status of pt's applications for Medicaid/[employer] and Disability, respectively   Reason for Continuation of Hospitalization: Hallucinations Mania Medication stabilization   Estimated Length of Stay:  Update 6/30: 1-7 days Update 09/27/2022:  TBD Update 10/02/2022:  No changes at this time. UpdaUpdate 10/18/22: No changes at this time te 10/07/22: No changes at this time Update 10/18/22: No changes at this time Update 10/23/22: No changes at this time Update 10/28/22: No changes at this time Update 11/07/22: No changes at  this time Update 11/12/22: No changes at this time  Update 11/17/22: No changes at this time. Update 11/22/22: None at this time. Update 11/27/22 No changes at this time. Update 12/02/22 No changes at this time  Update 12/07/22: None at this time. Updates 12/12/22: No changes made at this time Updates 12/17/22: No changes made at this time    Last 3 [place] Suicide Severity Risk Score: Flowsheet Row Admission (Current) from 08/22/2022 in [facility] from 08/17/2022 in [facility] at [facility] ED from 08/12/2022 in [facility] at [facility]  C-SSRS RISK CATEGORY Low Risk No Risk No Risk       Last [facility] 2/9 Scores:     No data to display          Scribe for Treatment Team: Elza Rafter, Theresia Majors 12/17/2022 4:21 PM

## 2022-12-17 NOTE — Plan of Care (Signed)

## 2022-12-17 NOTE — Progress Notes (Signed)
   12/17/22 2100  Psych Admission Type (Psych Patients Only)  Admission Status Voluntary  Psychosocial Assessment  Patient Complaints None  Eye Contact Brief  Facial Expression Flat  Affect Flat  Speech Soft;Slow  Interaction Minimal  Motor Activity Shuffling;Slow  Appearance/Hygiene Layered clothes  Behavior Characteristics Cooperative;Appropriate to situation  Mood Pleasant  Thought Process  Coherency WDL  Content WDL  Delusions None reported or observed  Perception WDL  Hallucination None reported or observed  Judgment Impaired  Confusion Mild  Danger to Self  Current suicidal ideation? Denies  Agreement Not to Harm Self Yes  Description of Agreement verbal  Danger to Others  Danger to Others None reported or observed  Danger to Others Abnormal  Harmful Behavior to others No threats or harm toward other people  Destructive Behavior No threats or harm toward property

## 2022-12-17 NOTE — Progress Notes (Signed)
Patient pleasant and cooperative.  Flat affect. Present in the dayroom for breakfast.  Flat affect.  Shuffling gait.  Denies SI/HI and AVH.   Denies anxiety and depression. Denies pain.  No scheduled medications. 15 min checks in place for safety.  Patient returned to bed after breakfast.  Present in the milieu sporadically.  Minimal interaction with peers and staff. Attended MHT group.

## 2022-12-17 NOTE — Group Note (Signed)
Recreation Therapy Group Note   Group Topic:Health and Wellness  Group Date: 12/17/2022 Start Time: 1400 End Time: 1445 Facilitators: Rosina Lowenstein, LRT, CTRS Location:  Dayroom  Group Description: Seated Exercise. LRT discussed the mental and physical benefits of exercise. LRT and group discussed how physical activity can be used as a coping skill. Pt's and LRT followed along to an exercise video on the TV screen that provided a visual representation and audio description of every exercise performed. Pt's encouraged to listen to their bodies and stop at any time if they experience feelings of discomfort or pain. LRT passed out water after session was over and encouraged pts do drink and stay hydrated.  Goal Area(s) Addressed: Patient will learn benefits of physical activity. Patient will identify exercise as a coping skill.  Patient will follow multistep directions. Patient will try a new leisure interest.    Affect/Mood: N/A   Participation Level: Did not attend    Clinical Observations/Individualized Feedback: Arvena did not attend group.  Plan: Continue to engage patient in RT group sessions 2-3x/week.   Rosina Lowenstein, LRT, CTRS 12/17/2022 2:52 PM

## 2022-12-17 NOTE — Group Note (Signed)
Date:  12/17/2022 Time:  10:54 AM  Group Topic/Focus:  Gratitude Group The purpose of this group was to identity the meaning of gratitude following with a learning video about gratitude and being thankful for the people in our life. Lastly writing a letter to whomever they were thankful for.    Participation Level:  Active  Participation Quality:  Appropriate  Affect:  Appropriate  Cognitive:  Appropriate  Insight: Appropriate  Engagement in Group:  Engaged  Modes of Intervention:  Activity and Discussion  Additional Comments:    [name] 12/17/2022, 10:54 AM

## 2022-12-18 DIAGNOSIS — F250 Schizoaffective disorder, bipolar type: Secondary | ICD-10-CM | POA: Diagnosis not present

## 2022-12-18 MED ORDER — PALIPERIDONE PALMITATE ER 156 MG/ML IM SUSY
156.0000 mg | PREFILLED_SYRINGE | Freq: Once | INTRAMUSCULAR | Status: AC
  Administered 2022-12-18: 156 mg via INTRAMUSCULAR
  Filled 2022-12-18: qty 1

## 2022-12-18 NOTE — Progress Notes (Signed)
   12/18/22 0700  Psych Admission Type (Psych Patients Only)  Admission Status Voluntary  Psychosocial Assessment  Patient Complaints None  Eye Contact Brief  Facial Expression Flat  Affect Flat  Speech Slurred  Interaction Minimal  Motor Activity Shuffling  Appearance/Hygiene Layered clothes  Behavior Characteristics Cooperative  Mood Pleasant  Thought Process  Coherency WDL  Content WDL  Delusions None reported or observed  Perception WDL  Hallucination None reported or observed  Judgment Impaired  Confusion Mild  Danger to Self  Current suicidal ideation? Denies  Agreement Not to Harm Self Yes  Danger to Others  Danger to Others None reported or observed  Danger to Others Abnormal  Harmful Behavior to others No threats or harm toward other people  Destructive Behavior No threats or harm toward property

## 2022-12-18 NOTE — Group Note (Signed)
  Date:  12/18/2022 Time:  12:06 AM  Group Topic/Focus:  Goals Group:   The focus of this group is to help patients establish daily goals to achieve during treatment and discuss how the patient can incorporate goal setting into their daily lives to aide in recovery.    Participation Level:  Did Not Attend  Participation Quality:      Affect:      Cognitive:      Insight: None  Engagement in Group:  None  Modes of Intervention:      Additional Comments:    [name] 12/18/2022, 12:06 AM

## 2022-12-18 NOTE — Group Note (Signed)
Date:  12/18/2022 Time:  9:42 PM  Group Topic/Focus:  Recovery Goals:   The focus of this group is to identify appropriate goals for recovery and establish a plan to achieve them.    Participation Level:  Active  Participation Quality:  Appropriate  Affect:  Appropriate  Cognitive:  Appropriate  Insight: Appropriate  Engagement in Group:  Engaged  Modes of Intervention:  Education  Additional Comments:    [name] 12/18/2022, 9:42 PM

## 2022-12-18 NOTE — Progress Notes (Signed)
   12/18/22 0600  15 Minute Checks  Location Bedroom  Visual Appearance Calm  Behavior Sleeping  Sleep (Behavioral Health Patients Only)  Calculate sleep? (Click Yes once per 24 hr at 0600 safety check) Yes  Documented sleep last 24 hours 10.25

## 2022-12-18 NOTE — Group Note (Signed)
Date:  12/18/2022 Time:  5:23 PM  Group Topic/Focus:  Goals Group:   The focus of this group is to help patients establish daily goals to achieve during treatment and discuss how the patient can incorporate goal setting into their daily lives to aide in recovery.    Participation Level:  Active  Participation Quality:  Appropriate  Affect:  Appropriate  Cognitive:  Alert and Appropriate  Insight: Appropriate  Engagement in Group:  Engaged  Modes of Intervention:  Activity and Discussion  Additional Comments:    Ardelle Anton 12/18/2022, 5:23 PM

## 2022-12-18 NOTE — Progress Notes (Signed)
[facility] MD Progress Note  12/18/2022 1:35 PM Betty Henderson  MRN:  [id number] Subjective: Betty Henderson is seen on rounds.  She will get her Gean Birchwood today.  She has been pleasant and cooperative on the unit.  Nurses report no issues.  She has been in good controls.  Social work continues to work with DSS on placement. Principal Problem: Schizoaffective disorder, bipolar type (HCC) Diagnosis: Principal Problem:   Schizoaffective disorder, bipolar type (HCC)  Total Time spent with patient: 15 minutes  Past Psychiatric History: Schizoaffective disorder  Past Medical History:  Past Medical History:  Diagnosis Date   Anemia    Arthritis    Chronic pain    Drug-seeking behavior    Malingering    Osteopetrosis    Psychosis (HCC)    Schizoaffective disorder, bipolar type (HCC)     Past Surgical History:  Procedure Laterality Date   MOUTH SURGERY     TUBAL LIGATION     Family History:  Family History  Family history unknown: Yes   Family Psychiatric  History: Unremarkable Social History:  Social History   Substance and Sexual Activity  Alcohol Use Not Currently   Comment: 1 cocktail 3 weeks ago     Social History   Substance and Sexual Activity  Drug Use Not Currently   Types: Cocaine   Comment: states "it's legal though"    Social History   Socioeconomic History   Marital status: Single    Spouse name: Not on file   Number of children: Not on file   Years of education: Not on file   Highest education level: Not on file  Occupational History   Not on file  Tobacco Use   Smoking status: Every Day    Current packs/day: 0.50    Types: Cigarettes   Smokeless tobacco: Never  Vaping Use   Vaping status: Never Used  Substance and Sexual Activity   Alcohol use: Not Currently    Comment: 1 cocktail 3 weeks ago   Drug use: Not Currently    Types: Cocaine    Comment: states "it's legal though"   Sexual activity: Never  Other Topics Concern   Not on file  Social  History Narrative   Not on file   Social Determinants of Health   Financial Resource Strain: Not on file  Food Insecurity: Food Insecurity Present (08/22/2022)   Hunger Vital Sign    Worried About Running Out of Food in the Last Year: Sometimes true    Ran Out of Food in the Last Year: Sometimes true  Transportation Needs: Patient Unable To Answer (08/22/2022)   PRAPARE - Transportation    Lack of Transportation (Medical): Patient unable to answer    Lack of Transportation (Non-Medical): Patient unable to answer  Recent Concern: Transportation Needs - Unmet Transportation Needs (06/13/2022)   Received from [facility], [facility]   [facility] - Transportation    Lack of Transportation (Medical): Not on file    Lack of Transportation (Non-Medical): Yes  Physical Activity: Not on file  Stress: Not on file  Social Connections: Unknown (08/06/2021)   Received from [facility], [facility]   Social Network    Social Network: Not on file   Additional Social History:                         Sleep: Good  Appetite:  Good  Current Medications: Current Facility-Administered Medications  Medication Dose Route Frequency Provider Last  Rate Last Admin   acetaminophen (TYLENOL) tablet 650 mg  650 mg Oral Q6H PRN [name], DO   650 mg at 12/17/22 2121   alum & mag hydroxide-simeth (MAALOX/MYLANTA) 200-200-20 MG/5ML suspension 30 mL  30 mL Oral Q4H PRN [name], DO   30 mL at 09/20/22 1142   diphenhydrAMINE (BENADRYL) capsule 50 mg  50 mg Oral Q6H PRN [name], DO   50 mg at 09/29/22 2254   Or   diphenhydrAMINE (BENADRYL) injection 50 mg  50 mg Intramuscular Q6H PRN [name], DO       haloperidol (HALDOL) tablet 5 mg  5 mg Oral Q6H PRN [name], DO   5 mg at 09/28/22 2149   Or   haloperidol lactate (HALDOL) injection 5 mg  5 mg Intramuscular Q6H PRN [name], DO       ibuprofen (ADVIL)  tablet 600 mg  600 mg Oral Q6H PRN [name], [name], MD   600 mg at 12/16/22 2107   LORazepam (ATIVAN) tablet 1 mg  1 mg Oral TID PRN [name], DO   1 mg at 09/27/22 0025   magnesium hydroxide (MILK OF MAGNESIA) suspension 30 mL  30 mL Oral Daily PRN [name], DO   30 mL at 11/08/22 1012   traZODone (DESYREL) tablet 50 mg  50 mg Oral QHS PRN [name], DO   50 mg at 12/17/22 2119    Lab Results: No results found for this or any previous visit (from the past 48 hour(s)).  Blood Alcohol level:  Lab Results  Component Value Date   ETH <10 08/17/2022   ETH <10 01/11/2021    Metabolic Disorder Labs: No results found for: "HGBA1C", "MPG" No results found for: "PROLACTIN" No results found for: "CHOL", "TRIG", "HDL", "CHOLHDL", "VLDL", "LDLCALC"  Physical Findings: AIMS:  , ,  ,  ,    CIWA:    COWS:     Musculoskeletal: Strength & Muscle Tone: within normal limits Gait & Station: normal Patient leans: N/A  Psychiatric Specialty Exam:  Presentation  General Appearance:  Casual; Neat  Eye Contact: Fair  Speech: Slow; Slurred  Speech Volume: Decreased  Handedness: Right   Mood and Affect  Mood: Anxious  Affect: Inappropriate   Thought Process  Thought Processes: Disorganized  Descriptions of Associations:Loose  Orientation:Partial  Thought Content:Illogical; Rumination  History of Schizophrenia/Schizoaffective disorder:No data recorded Duration of Psychotic Symptoms:No data recorded Hallucinations:No data recorded Ideas of Reference:Paranoia  Suicidal Thoughts:No data recorded Homicidal Thoughts:No data recorded  Sensorium  Memory: Immediate Fair; Remote Poor  Judgment: Poor  Insight: Poor   Executive Functions  Concentration: Poor  Attention Span: Fair  Recall: [employer] of Knowledge: Fair  Language: Fair   Psychomotor Activity  Psychomotor Activity:No data recorded  Assets   Assets: Communication Skills   Sleep  Sleep:No data recorded   Blood pressure 117/71, pulse 68, temperature 98.7 F (37.1 C), resp. rate 15, height 5\' 6"  (1.676 m), weight (P) 82.8 kg, SpO2 98%. Body mass index is 29.46 kg/m (pended).   Treatment Plan Summary: Daily contact with patient to assess and evaluate symptoms and progress in treatment, Medication management, and Plan Invega Sustenna 156 today.  Jye Fariss Tresea Mall, DO 12/18/2022, 1:35 PM

## 2022-12-18 NOTE — Progress Notes (Signed)
Received 156mg  of invega sustenna injection today. Tolerated well in the right ventrogluteal.

## 2022-12-18 NOTE — Group Note (Signed)
Date:  12/18/2022 Time:  4:58 PM  Group Topic/Focus:  Outside Rec/Music Therapy  The purpose of this group is to allow patients to get fresh air while listening to soothing music     Participation Level:  Active  Participation Quality:  Appropriate  Affect:  Appropriate  Cognitive:  Appropriate  Insight: Appropriate  Engagement in Group:  Engaged  Modes of Intervention:  Activity  Additional Comments:    [name] 12/18/2022, 4:58 PM

## 2022-12-19 DIAGNOSIS — F250 Schizoaffective disorder, bipolar type: Secondary | ICD-10-CM | POA: Diagnosis not present

## 2022-12-19 NOTE — Progress Notes (Signed)
   12/18/22 2300  Psych Admission Type (Psych Patients Only)  Admission Status Voluntary  Psychosocial Assessment  Patient Complaints None  Eye Contact Brief  Facial Expression Flat  Affect Flat  Speech Slurred  Interaction Minimal  Motor Activity Shuffling  Appearance/Hygiene Layered clothes  Behavior Characteristics Cooperative  Mood Pleasant  Thought Process  Coherency WDL  Content WDL  Delusions None reported or observed  Perception Hallucinations  Hallucination Auditory  Judgment Impaired  Confusion Mild  Danger to Self  Current suicidal ideation? Denies  Agreement Not to Harm Self Yes  Description of Agreement Verbal  Danger to Others  Danger to Others None reported or observed  Danger to Others Abnormal  Harmful Behavior to others No threats or harm toward other people  Destructive Behavior No threats or harm toward property

## 2022-12-19 NOTE — Progress Notes (Signed)
Patient is a voluntary admission to [facility] for SAD Bipolar. She is pleasant with staff and peers and comes out for meals, but does not participate in group unless they are going outside. Ambulates without assistance, completes her own ADL's, including showering and obtaining necessary items to complete the task. Denies SI, HI, AVH, Depression and Anxiety.  Will continue to monitor.

## 2022-12-19 NOTE — Progress Notes (Signed)
   12/19/22 0558  15 Minute Checks  Location Bedroom  Visual Appearance Calm  Behavior Sleeping  Sleep (Behavioral Health Patients Only)  Calculate sleep? (Click Yes once per 24 hr at 0600 safety check) Yes  Documented sleep last 24 hours 11.25

## 2022-12-19 NOTE — Group Note (Signed)
Recreation Therapy Group Note   Group Topic:Coping Skills  Group Date: 12/19/2022 Start Time: 1000 End Time: 1050 Facilitators: Rosina Lowenstein, LRT, CTRS Location: Courtyard  Group Description: Music Reminisce. LRT encouraged patients to think of their favorite song(s) that reminded them of a positive memory or time in their life. LRT encouraged patient to talk about that memory aloud to the group. LRT played the song through a speaker for all to hear. LRT and patients discussed how thinking of a positive memory or time in their life can be used as a coping skill in everyday life post discharge.    Goal Area(s) Addressed: Patient will increase verbal communication by conversing with peers. Patient will contribute to group discussion with minimal prompting. Patient will reminisce a positive memory or moment in their life.    Affect/Mood: Appropriate and Flat   Participation Level: Active and Engaged   Participation Quality: Independent   Behavior: Appropriate, Calm, and Cooperative   Speech/Thought Process: Coherent   Insight: Fair   Judgement: Good   Modes of Intervention: Music   Patient Response to Interventions:  Attentive, Engaged, Interested , and Receptive   Education Outcome:  Acknowledges education   Clinical Observations/Individualized Feedback: Betty Henderson was active in their participation of session activities and group discussion. Pt identified "anything by The Temptations or Donn Pierini" as her favorite songs. Pt interacted well with LRT and peers duration of session.   Plan: Continue to engage patient in RT group sessions 2-3x/week.   Rosina Lowenstein, LRT, CTRS 12/19/2022 11:38 AM

## 2022-12-19 NOTE — Progress Notes (Signed)
[facility] MD Progress Note  12/19/2022 1:37 PM Betty Henderson  MRN:  [id number] Subjective: Betty Henderson is seen on rounds.  She had her Invega Sustenna 156 mg yesterday.  No problems and no side effects.  She continues on a low-dose at nighttime.  Social work continues to work with DSS on placement. Principal Problem: Schizoaffective disorder, bipolar type (HCC) Diagnosis: Principal Problem:   Schizoaffective disorder, bipolar type (HCC)  Total Time spent with patient: 15 minutes  Past Psychiatric History: Schizoaffective disorder bipolar type  Past Medical History:  Past Medical History:  Diagnosis Date   Anemia    Arthritis    Chronic pain    Drug-seeking behavior    Malingering    Osteopetrosis    Psychosis (HCC)    Schizoaffective disorder, bipolar type (HCC)     Past Surgical History:  Procedure Laterality Date   MOUTH SURGERY     TUBAL LIGATION     Family History:  Family History  Family history unknown: Yes   Family Psychiatric  History: Unremarkable Social History:  Social History   Substance and Sexual Activity  Alcohol Use Not Currently   Comment: 1 cocktail 3 weeks ago     Social History   Substance and Sexual Activity  Drug Use Not Currently   Types: Cocaine   Comment: states "it's legal though"    Social History   Socioeconomic History   Marital status: Single    Spouse name: Not on file   Number of children: Not on file   Years of education: Not on file   Highest education level: Not on file  Occupational History   Not on file  Tobacco Use   Smoking status: Every Day    Current packs/day: 0.50    Types: Cigarettes   Smokeless tobacco: Never  Vaping Use   Vaping status: Never Used  Substance and Sexual Activity   Alcohol use: Not Currently    Comment: 1 cocktail 3 weeks ago   Drug use: Not Currently    Types: Cocaine    Comment: states "it's legal though"   Sexual activity: Never  Other Topics Concern   Not on file  Social History  Narrative   Not on file   Social Determinants of Health   Financial Resource Strain: Not on file  Food Insecurity: Food Insecurity Present (08/22/2022)   Hunger Vital Sign    Worried About Running Out of Food in the Last Year: Sometimes true    Ran Out of Food in the Last Year: Sometimes true  Transportation Needs: Patient Unable To Answer (08/22/2022)   PRAPARE - Transportation    Lack of Transportation (Medical): Patient unable to answer    Lack of Transportation (Non-Medical): Patient unable to answer  Recent Concern: Transportation Needs - Unmet Transportation Needs (06/13/2022)   Received from [facility], [facility]   [facility] - Transportation    Lack of Transportation (Medical): Not on file    Lack of Transportation (Non-Medical): Yes  Physical Activity: Not on file  Stress: Not on file  Social Connections: Unknown (08/06/2021)   Received from [facility], [facility]   Social Network    Social Network: Not on file   Additional Social History:                         Sleep: Good  Appetite:  Good  Current Medications: Current Facility-Administered Medications  Medication Dose Route Frequency Provider Last Rate Last Admin  acetaminophen (TYLENOL) tablet 650 mg  650 mg Oral Q6H PRN [name], DO   650 mg at 12/17/22 2121   alum & mag hydroxide-simeth (MAALOX/MYLANTA) 200-200-20 MG/5ML suspension 30 mL  30 mL Oral Q4H PRN [name], DO   30 mL at 09/20/22 1142   diphenhydrAMINE (BENADRYL) capsule 50 mg  50 mg Oral Q6H PRN [name], DO   50 mg at 09/29/22 2254   Or   diphenhydrAMINE (BENADRYL) injection 50 mg  50 mg Intramuscular Q6H PRN [name], DO       haloperidol (HALDOL) tablet 5 mg  5 mg Oral Q6H PRN [name], DO   5 mg at 09/28/22 2149   Or   haloperidol lactate (HALDOL) injection 5 mg  5 mg Intramuscular Q6H PRN [name], DO       ibuprofen (ADVIL) tablet 600  mg  600 mg Oral Q6H PRN [name], [name], MD   600 mg at 12/16/22 2107   LORazepam (ATIVAN) tablet 1 mg  1 mg Oral TID PRN [name], DO   1 mg at 09/27/22 0025   magnesium hydroxide (MILK OF MAGNESIA) suspension 30 mL  30 mL Oral Daily PRN [name], DO   30 mL at 11/08/22 1012   traZODone (DESYREL) tablet 50 mg  50 mg Oral QHS PRN [name], DO   50 mg at 12/18/22 2112    Lab Results: No results found for this or any previous visit (from the past 48 hour(s)).  Blood Alcohol level:  Lab Results  Component Value Date   ETH <10 08/17/2022   ETH <10 01/11/2021    Metabolic Disorder Labs: No results found for: "HGBA1C", "MPG" No results found for: "PROLACTIN" No results found for: "CHOL", "TRIG", "HDL", "CHOLHDL", "VLDL", "LDLCALC"  Physical Findings: AIMS:  , ,  ,  ,    CIWA:    COWS:     Musculoskeletal: Strength & Muscle Tone: within normal limits Gait & Station: normal Patient leans: N/A  Psychiatric Specialty Exam:  Presentation  General Appearance:  Casual; Neat  Eye Contact: Fair  Speech: Slow; Slurred  Speech Volume: Decreased  Handedness: Right   Mood and Affect  Mood: Anxious  Affect: Inappropriate   Thought Process  Thought Processes: Disorganized  Descriptions of Associations:Loose  Orientation:Partial  Thought Content:Illogical; Rumination  History of Schizophrenia/Schizoaffective disorder:No data recorded Duration of Psychotic Symptoms:No data recorded Hallucinations:No data recorded Ideas of Reference:Paranoia  Suicidal Thoughts:No data recorded Homicidal Thoughts:No data recorded  Sensorium  Memory: Immediate Fair; Remote Poor  Judgment: Poor  Insight: Poor   Executive Functions  Concentration: Poor  Attention Span: Fair  Recall: [employer] of Knowledge: Fair  Language: Fair   Psychomotor Activity  Psychomotor Activity:No data recorded  Assets   Assets: Communication Skills   Sleep  Sleep:No data recorded    Blood pressure (!) 146/84, pulse 89, temperature 98 F (36.7 C), resp. rate 15, height 5\' 6"  (1.676 m), weight (P) 82.8 kg, SpO2 99%. Body mass index is 29.46 kg/m (pended).   Treatment Plan Summary: Daily contact with patient to assess and evaluate symptoms and progress in treatment, Medication management, and Plan continue current medications.  [name], DO 12/19/2022, 1:37 PM

## 2022-12-20 DIAGNOSIS — F250 Schizoaffective disorder, bipolar type: Secondary | ICD-10-CM | POA: Diagnosis not present

## 2022-12-20 NOTE — Progress Notes (Signed)
Patient originally IVC on Aug 22, 2022 to [facility] for worsening psychosis. She is now a voluntary admission awaiting placement.  She denies SI/HI/AVH. She denies anxiety and depression. Complained of 8/10 bilat leg pain. Tylenol 650 mg adm at 1426. Upon follow up, pain decreased to a 4.  Q15 minute unit checks in place.

## 2022-12-20 NOTE — Plan of Care (Signed)
  Problem: Education: Goal: Knowledge of General Education information will improve Description Including pain rating scale, medication(s)/side effects and non-pharmacologic comfort measures Outcome: Progressing   Problem: Health Behavior/Discharge Planning: Goal: Ability to manage health-related needs will improve Outcome: Progressing   

## 2022-12-20 NOTE — Group Note (Signed)
Date:  12/20/2022 Time:  10:52 AM  Group Topic/Focus:  Building Self Esteem:   The Focus of this group is helping patients become aware of the effects of self-esteem on their lives, the things they and others do that enhance or undermine their self-esteem, seeing the relationship between their level of self-esteem and the choices they make and learning ways to enhance self-esteem.    Participation Level:  Active  Participation Quality:  Attentive  Affect:  Appropriate  Cognitive:  Alert  Insight: Good  Engagement in Group:  Engaged  Modes of Intervention:  Education  Additional Comments:    Ardelle Anton 12/20/2022, 10:52 AM

## 2022-12-20 NOTE — Progress Notes (Signed)
[facility] MD Progress Note  12/20/2022 1:12 PM Betty Henderson  MRN:  [id number] Subjective: Betty Henderson is seen on rounds.  She has no complaints.  No side effects from her medication.  No evidence of EPS or TD.  She has been pleasant and cooperative and compliant with medication.  Social work continues to work with DSS to find placement. Principal Problem: Schizoaffective disorder, bipolar type (HCC) Diagnosis: Principal Problem:   Schizoaffective disorder, bipolar type (HCC)  Total Time spent with patient: 15 minutes  Past Psychiatric History: Long history of schizoaffective disorder, bipolar type  Past Medical History:  Past Medical History:  Diagnosis Date   Anemia    Arthritis    Chronic pain    Drug-seeking behavior    Malingering    Osteopetrosis    Psychosis (HCC)    Schizoaffective disorder, bipolar type (HCC)     Past Surgical History:  Procedure Laterality Date   MOUTH SURGERY     TUBAL LIGATION     Family History:  Family History  Family history unknown: Yes   Family Psychiatric  History: Unremarkable Social History:  Social History   Substance and Sexual Activity  Alcohol Use Not Currently   Comment: 1 cocktail 3 weeks ago     Social History   Substance and Sexual Activity  Drug Use Not Currently   Types: Cocaine   Comment: states "it's legal though"    Social History   Socioeconomic History   Marital status: Single    Spouse name: Not on file   Number of children: Not on file   Years of education: Not on file   Highest education level: Not on file  Occupational History   Not on file  Tobacco Use   Smoking status: Every Day    Current packs/day: 0.50    Types: Cigarettes   Smokeless tobacco: Never  Vaping Use   Vaping status: Never Used  Substance and Sexual Activity   Alcohol use: Not Currently    Comment: 1 cocktail 3 weeks ago   Drug use: Not Currently    Types: Cocaine    Comment: states "it's legal though"   Sexual activity: Never   Other Topics Concern   Not on file  Social History Narrative   Not on file   Social Determinants of Health   Financial Resource Strain: Not on file  Food Insecurity: Food Insecurity Present (08/22/2022)   Hunger Vital Sign    Worried About Running Out of Food in the Last Year: Sometimes true    Ran Out of Food in the Last Year: Sometimes true  Transportation Needs: Patient Unable To Answer (08/22/2022)   PRAPARE - Transportation    Lack of Transportation (Medical): Patient unable to answer    Lack of Transportation (Non-Medical): Patient unable to answer  Recent Concern: Transportation Needs - Unmet Transportation Needs (06/13/2022)   Received from [facility], [facility]   [facility] - Transportation    Lack of Transportation (Medical): Not on file    Lack of Transportation (Non-Medical): Yes  Physical Activity: Not on file  Stress: Not on file  Social Connections: Unknown (08/06/2021)   Received from [facility], [facility]   Social Network    Social Network: Not on file   Additional Social History:                         Sleep: Good  Appetite:  Good  Current Medications: Current Facility-Administered Medications  Medication Dose Route Frequency Provider Last Rate Last Admin   acetaminophen (TYLENOL) tablet 650 mg  650 mg Oral Q6H PRN [name], DO   650 mg at 12/19/22 1950   alum & mag hydroxide-simeth (MAALOX/MYLANTA) 200-200-20 MG/5ML suspension 30 mL  30 mL Oral Q4H PRN [name], DO   30 mL at 09/20/22 1142   diphenhydrAMINE (BENADRYL) capsule 50 mg  50 mg Oral Q6H PRN [name], DO   50 mg at 09/29/22 2254   Or   diphenhydrAMINE (BENADRYL) injection 50 mg  50 mg Intramuscular Q6H PRN [name], DO       haloperidol (HALDOL) tablet 5 mg  5 mg Oral Q6H PRN [name], DO   5 mg at 09/28/22 2149   Or   haloperidol lactate (HALDOL) injection 5 mg  5 mg Intramuscular Q6H PRN [name], DO       ibuprofen (ADVIL) tablet 600 mg  600 mg Oral Q6H PRN [name], [name], MD   600 mg at 12/16/22 2107   LORazepam (ATIVAN) tablet 1 mg  1 mg Oral TID PRN [name], DO   1 mg at 09/27/22 0025   magnesium hydroxide (MILK OF MAGNESIA) suspension 30 mL  30 mL Oral Daily PRN [name], DO   30 mL at 11/08/22 1012   traZODone (DESYREL) tablet 50 mg  50 mg Oral QHS PRN [name], DO   50 mg at 12/19/22 2105    Lab Results: No results found for this or any previous visit (from the past 48 hour(s)).  Blood Alcohol level:  Lab Results  Component Value Date   ETH <10 08/17/2022   ETH <10 01/11/2021    Metabolic Disorder Labs: No results found for: "HGBA1C", "MPG" No results found for: "PROLACTIN" No results found for: "CHOL", "TRIG", "HDL", "CHOLHDL", "VLDL", "LDLCALC"  Physical Findings: AIMS:  , ,  ,  ,    CIWA:    COWS:     Musculoskeletal: Strength & Muscle Tone: within normal limits Gait & Station: normal Patient leans: N/A  Psychiatric Specialty Exam:  Presentation  General Appearance:  Casual; Neat  Eye Contact: Fair  Speech: Slow; Slurred  Speech Volume: Decreased  Handedness: Right   Mood and Affect  Mood: Anxious  Affect: Inappropriate   Thought Process  Thought Processes: Disorganized  Descriptions of Associations:Loose  Orientation:Partial  Thought Content:Illogical; Rumination  History of Schizophrenia/Schizoaffective disorder:No data recorded Duration of Psychotic Symptoms:No data recorded Hallucinations:No data recorded Ideas of Reference:Paranoia  Suicidal Thoughts:No data recorded Homicidal Thoughts:No data recorded  Sensorium  Memory: Immediate Fair; Remote Poor  Judgment: Poor  Insight: Poor   Executive Functions  Concentration: Poor  Attention Span: Fair  Recall: [employer] of Knowledge: Fair  Language: Fair   Psychomotor Activity   Psychomotor Activity:No data recorded  Assets  Assets: Communication Skills   Sleep  Sleep:No data recorded    Blood pressure 101/61, pulse 70, temperature (!) 97.2 F (36.2 C), resp. rate 16, height 5\' 6"  (1.676 m), weight (P) 82.8 kg, SpO2 95%. Body mass index is 29.46 kg/m (pended).   Treatment Plan Summary: Daily contact with patient to assess and evaluate symptoms and progress in treatment, Medication management, and Plan continue current medications.  Kimyah Frein Tresea Mall, DO 12/20/2022, 1:12 PM

## 2022-12-20 NOTE — Progress Notes (Signed)
   12/20/22 0600  15 Minute Checks  Location Bedroom  Visual Appearance Calm  Behavior Sleeping  Sleep (Behavioral Health Patients Only)  Calculate sleep? (Click Yes once per 24 hr at 0600 safety check) Yes  Documented sleep last 24 hours 9.75

## 2022-12-20 NOTE — Group Note (Signed)
[facility] LCSW Group Therapy Note   Group Date: 12/20/2022 Start Time: 1400 End Time: 1445  Type of Therapy/Topic:  Group Therapy:  Feelings about Diagnosis  Participation Level:  Active   Mood: Sullen    Description of Group:    This group will allow patients to explore their thoughts and feelings about diagnoses they have received. Patients will be guided to explore their level of understanding and acceptance of these diagnoses. Facilitator will encourage patients to process their thoughts and feelings about the reactions of others to their diagnosis, and will guide patients in identifying ways to discuss their diagnosis with significant others in their lives. This group will be process-oriented, with patients participating in exploration of their own experiences as well as giving and receiving support and challenge from other group members.   Therapeutic Goals: 1. Patient will demonstrate understanding of diagnosis as evidence by identifying two or more symptoms of the disorder:  2. Patient will be able to express two feelings regarding the diagnosis 3. Patient will demonstrate ability to communicate their needs through discussion and/or role plays  Summary of Patient Progress:    Pt discussed lack of support from her family regarding diagnosis.    Therapeutic Modalities:   Cognitive Behavioral Therapy Brief Therapy Feelings Identification    Betty Henderson, LCSWA

## 2022-12-20 NOTE — Progress Notes (Signed)
   12/19/22 2300  Psychosocial Assessment  Eye Contact Brief  Facial Expression Flat  Affect Flat  Speech Slurred  Interaction Minimal  Motor Activity Shuffling  Appearance/Hygiene Layered clothes  Thought Process  Coherency WDL  Content WDL  Delusions None reported or observed  Perception Hallucinations  Hallucination Auditory  Judgment Impaired  Confusion Mild  Danger to Self  Current suicidal ideation? Denies  Agreement Not to Harm Self Yes  Description of Agreement Verbal  Danger to Others  Danger to Others None reported or observed  Danger to Others Abnormal  Harmful Behavior to others No threats or harm toward other people  Destructive Behavior No threats or harm toward property

## 2022-12-20 NOTE — Group Note (Signed)
Recreation Therapy Group Note   Group Topic:General Recreation  Group Date: 12/20/2022 Start Time: 1300 End Time: 1355 Facilitators: Rosina Lowenstein, LRT, CTRS Location: Day Room  Group Description: Bingo. LRT and patients played multiple games of Bingo with music playing in the background. LRT and pts discussed how this could be a leisure interest and the importance of doing things they enjoy post-discharge.   Goal Area(s) Addressed: Patient will identify leisure interests.  Patient will practice healthy decision making. Patient will engage in recreation activity.    Affect/Mood: N/A   Participation Level: Did not attend    Clinical Observations/Individualized Feedback: Betty Henderson did not attend group.  Plan: Continue to engage patient in RT group sessions 2-3x/week.   Rosina Lowenstein, LRT, CTRS 12/20/2022 2:01 PM

## 2022-12-20 NOTE — Group Note (Signed)
Date:  12/20/2022 Time:  12:46 AM  Group Topic/Focus:  Goals Group:   The focus of this group is to help patients establish daily goals to achieve during treatment and discuss how the patient can incorporate goal setting into their daily lives to aide in recovery.    Participation Level:  Active  Participation Quality:  Appropriate  Affect:  Appropriate  Cognitive:  Appropriate  Insight: Appropriate  Engagement in Group:  Engaged  Modes of Intervention:  Clarification and Discussion  Additional Comments:    [name] 12/20/2022, 12:46 AM

## 2022-12-21 DIAGNOSIS — F250 Schizoaffective disorder, bipolar type: Secondary | ICD-10-CM | POA: Diagnosis not present

## 2022-12-21 NOTE — Progress Notes (Signed)
D- Patient alert and oriented. Denies anxiety and depression. Denies SI, HI, AVH, and pain.  A- PRN given for insomnia. Support and encouragement provided.  Routine safety checks conducted every 15 minutes.  Patient informed to notify staff with problems or concerns. R- No adverse drug reactions noted. Patient contracts for safety at this time. Patient compliant with medications and treatment plan. Patient receptive, calm, and cooperative. Limited interaction with peers and staff. No behavior issues noted. Slept well throughout the nigh.   Patient remains safe at this time.

## 2022-12-21 NOTE — Progress Notes (Signed)
Patient originally IVC on Aug 22, 2022 to [facility] for worsening psychosis. She is now a voluntary admission awaiting placement.   She denies SI/HI/AVH. She denies anxiety and depression. Complained of 10/10 bilat knee pain. Tylenol 650 mg adm at 1436. Upon follow up, pain decreased to a 5.   Q15 minute unit checks in place.

## 2022-12-21 NOTE — Plan of Care (Signed)

## 2022-12-21 NOTE — Group Note (Signed)
Date:  12/21/2022 Time:  8:35 PM  Group Topic/Focus:  Goals Group:   The focus of this group is to help patients establish daily goals to achieve during treatment and discuss how the patient can incorporate goal setting into their daily lives to aide in recovery.    Participation Level:  Active  Participation Quality:  Appropriate  Affect:  Appropriate  Cognitive:  Appropriate  Insight: Appropriate  Engagement in Group:  Engaged  Modes of Intervention:  Discussion  Additional Comments:    [name] 12/21/2022, 8:35 PM

## 2022-12-21 NOTE — Group Note (Signed)
Date:  12/21/2022 Time:  6:10 AM  Group Topic/Focus:  Making Healthy Choices:   The focus of this group is to help patients identify negative/unhealthy choices they were using prior to admission and identify positive/healthier coping strategies to replace them upon discharge.    Participation Level:  Active  Participation Quality:  Appropriate  Affect:  Appropriate  Cognitive:  Appropriate  Insight: Appropriate  Engagement in Group:  Engaged  Modes of Intervention:  Clarification and Discussion  Additional Comments:    [name] 12/21/2022, 6:10 AM

## 2022-12-21 NOTE — Progress Notes (Signed)
[facility] MD Progress Note  12/21/2022 11:50 AM Betty Henderson  MRN:  [id number] Subjective: Betty Henderson is seen on rounds.  She received her Invega Sustenna 156 2 days ago.  No side effects.  She has been pleasant and cooperative throughout her hospitalization.  No EPS or TD.  She remains isolative to her room for the most part because I think she is bored.  She does interact well with staff and peers.  She does not have any complaints and the nurses report no issues. Principal Problem: Schizoaffective disorder, bipolar type (HCC) Diagnosis: Principal Problem:   Schizoaffective disorder, bipolar type (HCC)  Total Time spent with patient: 15 minutes  Past Psychiatric History: History of schizoaffective disorder  Past Medical History:  Past Medical History:  Diagnosis Date   Anemia    Arthritis    Chronic pain    Drug-seeking behavior    Malingering    Osteopetrosis    Psychosis (HCC)    Schizoaffective disorder, bipolar type (HCC)     Past Surgical History:  Procedure Laterality Date   MOUTH SURGERY     TUBAL LIGATION     Family History:  Family History  Family history unknown: Yes   Family Psychiatric  History: Unremarkable Social History:  Social History   Substance and Sexual Activity  Alcohol Use Not Currently   Comment: 1 cocktail 3 weeks ago     Social History   Substance and Sexual Activity  Drug Use Not Currently   Types: Cocaine   Comment: states "it's legal though"    Social History   Socioeconomic History   Marital status: Single    Spouse name: Not on file   Number of children: Not on file   Years of education: Not on file   Highest education level: Not on file  Occupational History   Not on file  Tobacco Use   Smoking status: Every Day    Current packs/day: 0.50    Types: Cigarettes   Smokeless tobacco: Never  Vaping Use   Vaping status: Never Used  Substance and Sexual Activity   Alcohol use: Not Currently    Comment: 1 cocktail 3 weeks ago    Drug use: Not Currently    Types: Cocaine    Comment: states "it's legal though"   Sexual activity: Never  Other Topics Concern   Not on file  Social History Narrative   Not on file   Social Determinants of Health   Financial Resource Strain: Not on file  Food Insecurity: Food Insecurity Present (08/22/2022)   Hunger Vital Sign    Worried About Running Out of Food in the Last Year: Sometimes true    Ran Out of Food in the Last Year: Sometimes true  Transportation Needs: Patient Unable To Answer (08/22/2022)   PRAPARE - Transportation    Lack of Transportation (Medical): Patient unable to answer    Lack of Transportation (Non-Medical): Patient unable to answer  Recent Concern: Transportation Needs - Unmet Transportation Needs (06/13/2022)   Received from [facility], [facility]   [facility] - Transportation    Lack of Transportation (Medical): Not on file    Lack of Transportation (Non-Medical): Yes  Physical Activity: Not on file  Stress: Not on file  Social Connections: Unknown (08/06/2021)   Received from [facility], [facility]   Social Network    Social Network: Not on file  Sleep: Good  Appetite:  Good  Current Medications: Current Facility-Administered Medications  Medication Dose Route Frequency Provider Last Rate Last Admin   acetaminophen (TYLENOL) tablet 650 mg  650 mg Oral Q6H PRN [name], DO   650 mg at 12/20/22 1426   alum & mag hydroxide-simeth (MAALOX/MYLANTA) 200-200-20 MG/5ML suspension 30 mL  30 mL Oral Q4H PRN [name], DO   30 mL at 09/20/22 1142   diphenhydrAMINE (BENADRYL) capsule 50 mg  50 mg Oral Q6H PRN [name], DO   50 mg at 09/29/22 2254   Or   diphenhydrAMINE (BENADRYL) injection 50 mg  50 mg Intramuscular Q6H PRN [name], DO       haloperidol (HALDOL) tablet 5 mg  5 mg Oral Q6H PRN [name], DO   5 mg at 09/28/22 2149   Or    haloperidol lactate (HALDOL) injection 5 mg  5 mg Intramuscular Q6H PRN [name], DO       ibuprofen (ADVIL) tablet 600 mg  600 mg Oral Q6H PRN [name], [name], MD   600 mg at 12/16/22 2107   LORazepam (ATIVAN) tablet 1 mg  1 mg Oral TID PRN [name], DO   1 mg at 09/27/22 0025   magnesium hydroxide (MILK OF MAGNESIA) suspension 30 mL  30 mL Oral Daily PRN [name], DO   30 mL at 11/08/22 1012   traZODone (DESYREL) tablet 50 mg  50 mg Oral QHS PRN [name], DO   50 mg at 12/20/22 2109    Lab Results: No results found for this or any previous visit (from the past 48 hour(s)).  Blood Alcohol level:  Lab Results  Component Value Date   ETH <10 08/17/2022   ETH <10 01/11/2021    Metabolic Disorder Labs: No results found for: "HGBA1C", "MPG" No results found for: "PROLACTIN" No results found for: "CHOL", "TRIG", "HDL", "CHOLHDL", "VLDL", "LDLCALC"  Physical Findings: AIMS:  , ,  ,  ,    CIWA:    COWS:     Musculoskeletal: Strength & Muscle Tone: within normal limits Gait & Station: normal Patient leans: N/A  Psychiatric Specialty Exam:  Presentation  General Appearance:  Casual; Neat  Eye Contact: Fair  Speech: Slow; Slurred  Speech Volume: Decreased  Handedness: Right   Mood and Affect  Mood: Anxious  Affect: Inappropriate   Thought Process  Thought Processes: Disorganized  Descriptions of Associations:Loose  Orientation:Partial  Thought Content:Illogical; Rumination  History of Schizophrenia/Schizoaffective disorder:No data recorded Duration of Psychotic Symptoms:No data recorded Hallucinations:No data recorded Ideas of Reference:Paranoia  Suicidal Thoughts:No data recorded Homicidal Thoughts:No data recorded  Sensorium  Memory: Immediate Fair; Remote Poor  Judgment: Poor  Insight: Poor   Executive Functions  Concentration: Poor  Attention  Span: Fair  Recall: [employer] of Knowledge: Fair  Language: Fair   Psychomotor Activity  Psychomotor Activity:No data recorded  Assets  Assets: Communication Skills   Sleep  Sleep:No data recorded   MENTAL STATUS EXAM: Patient is alert and oriented x 3, pleasant and cooperative, good eye contact, speech is normal and not pressured, mood is depressed; affect is flat; thought process: goal directed; thought content: no suicidal ideation; judgment is poor, insight is poor.  Blood pressure 133/85, pulse 81, temperature 98.3 F (36.8 C), resp. rate 18, height 5\' 6"  (1.676 m), weight (P) 82.8 kg, SpO2 98%. Body mass index is 29.46 kg/m (pended).   Treatment  Plan Summary: Daily contact with patient to assess and evaluate symptoms and progress in treatment, Medication management, and Plan continue current medications.  [name], DO 12/21/2022, 11:50 AM

## 2022-12-21 NOTE — Group Note (Signed)
Date:  12/21/2022 Time:  10:47 AM  Group Topic/Focus:  Movement therapy    Participation Level:  Active  Participation Quality:  Appropriate  Affect:  Appropriate  Cognitive:  Appropriate  Insight: Appropriate  Engagement in Group:  Engaged  Modes of Intervention:  Activity  Additional Comments:  none  Rodena Goldmann 12/21/2022, 10:47 AM

## 2022-12-21 NOTE — Group Note (Signed)
LCSW Group Therapy Note   Group Date: 12/21/2022 Start Time: 1300 End Time: 1400  Description of Group The focus of this group was to determine what unhealthy coping techniques typically are used by group members and what healthy coping techniques would be helpful in coping with various problems. Patients were guided in becoming aware of the differences between healthy and unhealthy coping techniques. Patients were asked to identify 2-3 healthy coping skills they would like to learn to use more effectively, and many mentioned meditation, breathing, and relaxation. These were explained, samples demonstrated, and resources shared for how to learn more at discharge. At group closing, additional ideas of healthy coping skills were shared in a fun exercise.  Therapeutic Goals Patients learned that coping is what human beings do all day long to deal with various situations in their lives Patients defined and discussed healthy vs unhealthy coping techniques Patients identified their preferred coping techniques and identified whether these were healthy or unhealthy Patients determined 2-3 healthy coping skills they would like to become more familiar with and use more often, and practiced a few medications Patients provided support and ideas to each other   Summary of Patient Progress:  Did not attend   Therapeutic Modalities Cognitive Behavioral Therapy Motivational Interviewing   Seamus Warehime, LCSW, LCAS Clincal Social Worker  [facility]

## 2022-12-21 NOTE — Plan of Care (Signed)
  Problem: Coping: Goal: Level of anxiety will decrease Outcome: Progressing   Problem: Safety: Goal: Ability to remain free from injury will improve Outcome: Progressing   Problem: Skin Integrity: Goal: Risk for impaired skin integrity will decrease Outcome: Progressing   

## 2022-12-21 NOTE — BH IP Treatment Plan (Unsigned)
Interdisciplinary Treatment and Diagnostic Plan Update  12/21/2022 Time of Session: update, 8:30am.  Betty Henderson MRN: [id number]  Principal Diagnosis: Schizoaffective disorder, bipolar type (HCC)  Secondary Diagnoses: Principal Problem:   Schizoaffective disorder, bipolar type (HCC)   Current Medications:  Current Facility-Administered Medications  Medication Dose Route Frequency Provider Last Rate Last Admin   acetaminophen (TYLENOL) tablet 650 mg  650 mg Oral Q6H PRN [name], DO   650 mg at 12/20/22 1426   alum & mag hydroxide-simeth (MAALOX/MYLANTA) 200-200-20 MG/5ML suspension 30 mL  30 mL Oral Q4H PRN [name], DO   30 mL at 09/20/22 1142   diphenhydrAMINE (BENADRYL) capsule 50 mg  50 mg Oral Q6H PRN [name], DO   50 mg at 09/29/22 2254   Or   diphenhydrAMINE (BENADRYL) injection 50 mg  50 mg Intramuscular Q6H PRN [name], DO       haloperidol (HALDOL) tablet 5 mg  5 mg Oral Q6H PRN [name], DO   5 mg at 09/28/22 2149   Or   haloperidol lactate (HALDOL) injection 5 mg  5 mg Intramuscular Q6H PRN [name], DO       ibuprofen (ADVIL) tablet 600 mg  600 mg Oral Q6H PRN [name], [name], MD   600 mg at 12/16/22 2107   LORazepam (ATIVAN) tablet 1 mg  1 mg Oral TID PRN [name], DO   1 mg at 09/27/22 0025   magnesium hydroxide (MILK OF MAGNESIA) suspension 30 mL  30 mL Oral Daily PRN [name], DO   30 mL at 11/08/22 1012   traZODone (DESYREL) tablet 50 mg  50 mg Oral QHS PRN [name], DO   50 mg at 12/20/22 2109   PTA Medications: Medications Prior to Admission  Medication Sig Dispense Refill Last Dose   ondansetron (ZOFRAN) 4 MG tablet Take 1 tablet (4 mg total) by mouth every 8 (eight) hours as needed for nausea or vomiting. (Patient not taking: Reported on 08/17/2022) 20 tablet 0     Patient Stressors: Financial difficulties   Health  problems   Medication change or noncompliance    Patient Strengths: Ability for insight   Treatment Modalities: Medication Management, Group therapy, Case management,  1 to 1 session with clinician, Psychoeducation, Recreational therapy.   Physician Treatment Plan for Primary Diagnosis: Schizoaffective disorder, bipolar type (HCC) Long Term Goal(s): Improvement in symptoms so as ready for discharge   Short Term Goals: Ability to identify changes in lifestyle to reduce recurrence of condition will improve Ability to verbalize feelings will improve Ability to disclose and discuss suicidal ideas Ability to demonstrate self-control will improve Ability to identify and develop effective coping behaviors will improve Ability to maintain clinical measurements within normal limits will improve Compliance with prescribed medications will improve Ability to identify triggers associated with substance abuse/mental health issues will improve  Medication Management: Evaluate patient's response, side effects, and tolerance of medication regimen.  Therapeutic Interventions: 1 to 1 sessions, Unit Group sessions and Medication administration.  Evaluation of Outcomes: Progressing  Physician Treatment Plan for Secondary Diagnosis: Principal Problem:   Schizoaffective disorder, bipolar type (HCC)  Long Term Goal(s): Improvement in symptoms so as ready for discharge   Short Term Goals: Ability to identify changes in lifestyle to reduce recurrence of condition will improve Ability to verbalize feelings will improve Ability to disclose and discuss suicidal ideas Ability to demonstrate self-control will improve Ability to identify  and develop effective coping behaviors will improve Ability to maintain clinical measurements within normal limits will improve Compliance with prescribed medications will improve Ability to identify triggers associated with substance abuse/mental health issues will improve      Medication Management: Evaluate patient's response, side effects, and tolerance of medication regimen.  Therapeutic Interventions: 1 to 1 sessions, Unit Group sessions and Medication administration.  Evaluation of Outcomes: Progressing   RN Treatment Plan for Primary Diagnosis: Schizoaffective disorder, bipolar type (HCC) Long Term Goal(s): Knowledge of disease and therapeutic regimen to maintain health will improve  Short Term Goals: Ability to remain free from injury will improve, Ability to verbalize frustration and anger appropriately will improve, Ability to demonstrate self-control, Ability to participate in decision making will improve, Ability to verbalize feelings will improve, Ability to disclose and discuss suicidal ideas, Ability to identify and develop effective coping behaviors will improve, and Compliance with prescribed medications will improve  Medication Management: RN will administer medications as ordered by provider, will assess and evaluate patient's response and provide education to patient for prescribed medication. RN will report any adverse and/or side effects to prescribing provider.  Therapeutic Interventions: 1 on 1 counseling sessions, Psychoeducation, Medication administration, Evaluate responses to treatment, Monitor vital signs and CBGs as ordered, Perform/monitor CIWA, COWS, AIMS and Fall Risk screenings as ordered, Perform wound care treatments as ordered.  Evaluation of Outcomes: Progressing   LCSW Treatment Plan for Primary Diagnosis: Schizoaffective disorder, bipolar type (HCC) Long Term Goal(s): Safe transition to appropriate next level of care at discharge, Engage patient in therapeutic group addressing interpersonal concerns.  Short Term Goals: Engage patient in aftercare planning with referrals and resources, Increase social support, Increase ability to appropriately verbalize feelings, Increase emotional regulation, Facilitate acceptance of mental  health diagnosis and concerns, Facilitate patient progression through stages of change regarding substance use diagnoses and concerns, Identify triggers associated with mental health/substance abuse issues, and Increase skills for wellness and recovery  Therapeutic Interventions: Assess for all discharge needs, 1 to 1 time with Social worker, Explore available resources and support systems, Assess for adequacy in community support network, Educate family and significant other(s) on suicide prevention, Complete Psychosocial Assessment, Interpersonal group therapy.  Evaluation of Outcomes: Progressing   Progress in Treatment: Attending groups: Yes. and No. Participating in groups: Yes. and No. Taking medication as prescribed: Yes. Toleration medication: Yes. Family/Significant other contact made: Yes, individual(s) contacted:   [place] [phone] Patient understands diagnosis: Yes. Discussing patient identified problems/goals with staff: Yes. Medical problems stabilized or resolved: Yes. Denies suicidal/homicidal ideation: Yes. Issues/concerns per patient self-inventory: No. Other: None   New problem(s) identified: No, Describe:  12/21/2022: No changes at this time.   New Short Term/Long Term Goal(s):Update 6/30: none at this time. Update 09/27/2022:  No changes at this time.  Update 10/02/2022:  No changes at this time. Update 10/07/22: No changes at this time 10/12/22: No changes at this time Update 10/18/22: No changes at this time Update 10/23/22: No changes at this time Update 10/28/22: No changes at this time Update 11/07/22: No changes at this time Update 11/12/22: No changes at this time  Update 11/17/22: None at this time. Update 11/22/22: None at this time. Update 11/27/22 No changes at this time. Update 12/02/22 No changes at this time  Update 12/07/22: None at this time. Updates 12/12/22: No changes made at this time update 12/21/2022: No changes at this time  Patient Goals:  Update 6/30:  none at this time. Update 09/27/2022:  No changes at this time. Update 10/02/2022:  No changes at this time. Update 10/07/22: No changes at this time 10/12/22: No changes at this time Update 10/18/22: No changes at this time Update 10/23/22: No changes at this time Update 10/28/22: No changes at this time Update 11/07/22: No changes at this time Update 11/12/22: No changes at this time Update 11/17/22: None at this time. Update 11/22/22: None at this time. Update 11/27/22 No changes at this time Update 12/02/22 No changes at this time   Update 12/07/22: None at this time. Updates 12/12/22: No changes made at this time Updates 12/17/22: No changes made at this time updates: 12/21/2022: No changes at this time  Discharge Plan or Barriers: Update 6/30: APS report has been made and patient is being investigated for guardianship needs.  Remains homeless with limited supports. Update 09/27/2022:  No changes at this time.  Update 10/02/2022:  Patient remains safe on the unit at this time.  Patient remains psychotic at this time.  APS report has been made, however, no follow up from the caseworker on her case.  No safe discharge identified.  CSW has requested that application for Medicaid and disability be completed.   Update 10/07/22: No changes at this time 10/12/22: No changes at this time Update 10/18/22: No changes at this time Update 10/23/22: No changes at this time Update 10/23/22: No changes at this time Update 10/28/22: According to [facility], pt's caseworker at DSS, the petition was filed for guardianship on 10/21/22, now awaiting for the petition to be approvedUpdate 11/07/22: No changes at this time Update 11/12/22: CSW sent over patients information to [facility] and [facility], awaiting a response. CSW awaiting word from DSS regarding pt's petition and what agency will become her guardian. Update 11/17/22: No changes at this time. Update 11/22/22: CSW has sent patients information to multiple facilities that were suggested by  leadership, pt has been denied from the facility or the facility does not respond. CSW continues to send pt's information to nursing homes. Update 11/27/22 CSW contacted DSS to inquire about the status of guardianship. No response at this time Update 12/02/22 Supervisor Loraine Leriche contacted DSS who stated they are not taking pt on for guardianship but that Quincy Carnes will be present tomorrow to do a visit with social worker and pt and that pt's son will be signing documentation for placement for the pt. Update 12/07/22: [facility] DSS has declined to take guardianship. Son, Casimiro Needle is to step into a more present role in deciding placement. A conversation is still needed. Updates 12/12/22: CSW staffed case with leadership and they state that a family meeting must happen with pt's son. CSW contacted pt's financial navigator and her DSS worker so that they may be present on the call and are able to speak to the efforts they have made to secure pt funding and housing. CSW waiting on responses from both, leadership is aware. Updates 12/17/22: CSW awaiting updates from both [facility] DSS and [facility] financial navigators as to status of pt's applications for Medicaid/[employer] and Disability, respectively Updates: 12/21/2022: No changes at this time.     Reason for Continuation of Hospitalization: Hallucinations Mania Medication stabilization  Estimated Length of Stay: Update 6/30: 1-7 days Update 09/27/2022:  TBD Update 10/02/2022:  No changes at this time. UpdaUpdate 10/18/22: No changes at this time te 10/07/22: No changes at this time Update 10/18/22: No changes at this time Update 10/23/22: No changes at this time Update 10/28/22:  No changes at this time Update 11/07/22: No changes at this time Update 11/12/22: No changes at this time  Update 11/17/22: No changes at this time. Update 11/22/22: None at this time. Update 11/27/22 No changes at this time. Update 12/02/22 No changes at this time  Update 12/07/22: None  at this time. Updates 12/12/22: No changes made at this time Updates 12/17/22: No changes made at this time update: 12/21/2022: No changes at this time.   Last 3 [place] Suicide Severity Risk Score: Flowsheet Row Admission (Current) from 08/22/2022 in [facility] ED from 08/17/2022 in [facility] Emergency Department at [facility] ED from 08/12/2022 in [facility] Emergency Department at [facility]  C-SSRS RISK CATEGORY Low Risk No Risk No Risk       Last [facility] 2/9 Scores:     No data to display          Scribe for Treatment Team: Marshell Levan, LCSW   -- Scribed completed  by: Anson [place], LCSW, 12/21/2026 12/21/2022 11:58 AM

## 2022-12-22 DIAGNOSIS — F250 Schizoaffective disorder, bipolar type: Secondary | ICD-10-CM | POA: Diagnosis not present

## 2022-12-22 NOTE — Plan of Care (Signed)
?  Problem: Nutrition: ?Goal: Adequate nutrition will be maintained ?Outcome: Progressing ?  ?Problem: Coping: ?Goal: Level of anxiety will decrease ?Outcome: Progressing ?  ?Problem: Elimination: ?Goal: Will not experience complications related to bowel motility ?Outcome: Progressing ?Goal: Will not experience complications related to urinary retention ?Outcome: Progressing ?  ?Problem: Pain Managment: ?Goal: General experience of comfort will improve ?Outcome: Progressing ?  ?Problem: Safety: ?Goal: Ability to remain free from injury will improve ?Outcome: Progressing ?  ?

## 2022-12-22 NOTE — Group Note (Signed)
Date:  12/22/2022 Time:  11:06 PM  Group Topic/Focus:  Making Healthy Choices:   The focus of this group is to help patients identify negative/unhealthy choices they were using prior to admission and identify positive/healthier coping strategies to replace them upon discharge.    Participation Level:  Active  Participation Quality:  Appropriate  Affect:  Appropriate  Cognitive:  Appropriate  Insight: Appropriate  Engagement in Group:  Engaged  Modes of Intervention:  Discussion  Additional Comments:    Betty Henderson 12/22/2022, 11:06 PM

## 2022-12-22 NOTE — Plan of Care (Signed)

## 2022-12-22 NOTE — Progress Notes (Signed)
D- Patient alert and oriented. Flat affect. Denies anxiety and depression. Denies SI, HI, AVH, and pain.  A-  Routine safety checks conducted every 15 minutes.  Patient informed to notify staff with problems or concerns. R- Patient contracts for safety at this time. Patient compliant with medications and treatment plan. Patient receptive, calm, and cooperative. Slept well throughout the night. Patient remains safe at this time.

## 2022-12-22 NOTE — Progress Notes (Signed)
Pt denies SI/HI/AVH and verbally agrees to approach staff if these become apparent or before harming themselves/others. Voices no complaints to this RN. Attended group during this shift.  RN provided support and encouragement to pt. Q15 min safety checks implemented and continued. Pt is safe on the unit. Plan of care on going and no other concerns expressed at this time.

## 2022-12-22 NOTE — Progress Notes (Signed)
[facility] MD Progress Note  12/22/2022 1:09 PM Betty Henderson  MRN:  [id number] Subjective: Betty Henderson is seen on rounds.  She has not been a problem on the unit.  She took her [place] earlier this week.  She is pleasant and cooperative.  Still awaiting DSS.  Nurses report no issues. Principal Problem: Schizoaffective disorder, bipolar type (HCC) Diagnosis: Principal Problem:   Schizoaffective disorder, bipolar type (HCC)  Total Time spent with patient: 15 minutes  Past Psychiatric History: Schizoaffective disorder  Past Medical History:  Past Medical History:  Diagnosis Date   Anemia    Arthritis    Chronic pain    Drug-seeking behavior    Malingering    Osteopetrosis    Psychosis (HCC)    Schizoaffective disorder, bipolar type (HCC)     Past Surgical History:  Procedure Laterality Date   MOUTH SURGERY     TUBAL LIGATION     Family History:  Family History  Family history unknown: Yes   Family Psychiatric  History: Unremarkable Social History:  Social History   Substance and Sexual Activity  Alcohol Use Not Currently   Comment: 1 cocktail 3 weeks ago     Social History   Substance and Sexual Activity  Drug Use Not Currently   Types: Cocaine   Comment: states "it's legal though"    Social History   Socioeconomic History   Marital status: Single    Spouse name: Not on file   Number of children: Not on file   Years of education: Not on file   Highest education level: Not on file  Occupational History   Not on file  Tobacco Use   Smoking status: Every Day    Current packs/day: 0.50    Types: Cigarettes   Smokeless tobacco: Never  Vaping Use   Vaping status: Never Used  Substance and Sexual Activity   Alcohol use: Not Currently    Comment: 1 cocktail 3 weeks ago   Drug use: Not Currently    Types: Cocaine    Comment: states "it's legal though"   Sexual activity: Never  Other Topics Concern   Not on file  Social History Narrative   Not on file    Social Determinants of Health   Financial Resource Strain: Not on file  Food Insecurity: Food Insecurity Present (08/22/2022)   Hunger Vital Sign    Worried About Running Out of Food in the Last Year: Sometimes true    Ran Out of Food in the Last Year: Sometimes true  Transportation Needs: Patient Unable To Answer (08/22/2022)   PRAPARE - Transportation    Lack of Transportation (Medical): Patient unable to answer    Lack of Transportation (Non-Medical): Patient unable to answer  Recent Concern: Transportation Needs - Unmet Transportation Needs (06/13/2022)   Received from [facility], [facility]   [facility] - Transportation    Lack of Transportation (Medical): Not on file    Lack of Transportation (Non-Medical): Yes  Physical Activity: Not on file  Stress: Not on file  Social Connections: Unknown (08/06/2021)   Received from [facility], [facility]   Social Network    Social Network: Not on file   Additional Social History:                         Sleep: Good  Appetite:  Good  Current Medications: Current Facility-Administered Medications  Medication Dose Route Frequency Provider Last Rate Last Admin   acetaminophen (  TYLENOL) tablet 650 mg  650 mg Oral Q6H PRN [name], DO   650 mg at 12/21/22 1436   alum & mag hydroxide-simeth (MAALOX/MYLANTA) 200-200-20 MG/5ML suspension 30 mL  30 mL Oral Q4H PRN [name], DO   30 mL at 09/20/22 1142   diphenhydrAMINE (BENADRYL) capsule 50 mg  50 mg Oral Q6H PRN [name], DO   50 mg at 09/29/22 2254   Or   diphenhydrAMINE (BENADRYL) injection 50 mg  50 mg Intramuscular Q6H PRN [name], DO       haloperidol (HALDOL) tablet 5 mg  5 mg Oral Q6H PRN [name], DO   5 mg at 09/28/22 2149   Or   haloperidol lactate (HALDOL) injection 5 mg  5 mg Intramuscular Q6H PRN [name], DO       ibuprofen (ADVIL) tablet 600 mg  600 mg Oral Q6H PRN  [name], [name], MD   600 mg at 12/16/22 2107   LORazepam (ATIVAN) tablet 1 mg  1 mg Oral TID PRN [name], DO   1 mg at 09/27/22 0025   magnesium hydroxide (MILK OF MAGNESIA) suspension 30 mL  30 mL Oral Daily PRN [name], DO   30 mL at 11/08/22 1012   traZODone (DESYREL) tablet 50 mg  50 mg Oral QHS PRN [name], DO   50 mg at 12/21/22 2135    Lab Results: No results found for this or any previous visit (from the past 48 hour(s)).  Blood Alcohol level:  Lab Results  Component Value Date   ETH <10 08/17/2022   ETH <10 01/11/2021    Metabolic Disorder Labs: No results found for: "HGBA1C", "MPG" No results found for: "PROLACTIN" No results found for: "CHOL", "TRIG", "HDL", "CHOLHDL", "VLDL", "LDLCALC"  Physical Findings: AIMS:  , ,  ,  ,    CIWA:    COWS:     Musculoskeletal: Strength & Muscle Tone: within normal limits Gait & Station: normal Patient leans: N/A  Psychiatric Specialty Exam:  Presentation  General Appearance:  Casual; Neat  Eye Contact: Fair  Speech: Slow; Slurred  Speech Volume: Decreased  Handedness: Right   Mood and Affect  Mood: Anxious  Affect: Inappropriate   Thought Process  Thought Processes: Disorganized  Descriptions of Associations:Loose  Orientation:Partial  Thought Content:Illogical; Rumination  History of Schizophrenia/Schizoaffective disorder:No data recorded Duration of Psychotic Symptoms:No data recorded Hallucinations:No data recorded Ideas of Reference:Paranoia  Suicidal Thoughts:No data recorded Homicidal Thoughts:No data recorded  Sensorium  Memory: Immediate Fair; Remote Poor  Judgment: Poor  Insight: Poor   Executive Functions  Concentration: Poor  Attention Span: Fair  Recall: [employer] of Knowledge: Fair  Language: Fair   Psychomotor Activity  Psychomotor Activity:No data recorded  Assets  Assets: Communication  Skills   Sleep  Sleep:No data recorded   MENTAL STATUS EXAM: Patient is alert and oriented x 3, pleasant and cooperative, good eye contact, speech is normal and not pressured, mood is euthymic; affect is congruent; thought process: goal directed, some delusions; thought content: No suicidal ideation; judgment is poor, insight is poor.  Blood pressure 102/78, pulse 71, temperature (!) 97.5 F (36.4 C), resp. rate 15, height 5\' 6"  (1.676 m), weight (P) 82.8 kg, SpO2 96%. Body mass index is 29.46 kg/m (pended).   Treatment Plan Summary: Daily contact with patient to assess and evaluate symptoms and progress in treatment, Medication management, and Plan continue current medications.  [name], DO 12/22/2022, 1:09 PM

## 2022-12-22 NOTE — Group Note (Signed)
Date:  12/22/2022 Time:  4:59 PM  Group Topic/Focus:  Self Care:   The focus of this group is to help patients understand the importance of self-care in order to improve or restore emotional, physical, spiritual, interpersonal, and financial health.  PT focus on physical health, activity outside such as walking and staying mobile.     Participation Level:  Active  Participation Quality:  Appropriate  Affect:  Anxious  Cognitive:  Appropriate  Insight: Appropriate  Engagement in Group:  Developing/Improving  Modes of Intervention:  Activity   Berkley Harvey 12/22/2022, 4:59 PM

## 2022-12-23 DIAGNOSIS — F250 Schizoaffective disorder, bipolar type: Secondary | ICD-10-CM | POA: Diagnosis not present

## 2022-12-23 NOTE — Plan of Care (Signed)
  Problem: Education: Goal: Knowledge of General Education information will improve Description: Including pain rating scale, medication(s)/side effects and non-pharmacologic comfort measures Outcome: Progressing   Problem: Health Behavior/Discharge Planning: Goal: Ability to manage health-related needs will improve Outcome: Progressing   Problem: Clinical Measurements: Goal: Ability to maintain clinical measurements within normal limits will improve Outcome: Progressing Goal: Diagnostic test results will improve Outcome: Progressing   Problem: Nutrition: Goal: Adequate nutrition will be maintained Outcome: Progressing   Problem: Coping: Goal: Level of anxiety will decrease Outcome: Progressing   Problem: Elimination: Goal: Will not experience complications related to bowel motility Outcome: Progressing Goal: Will not experience complications related to urinary retention Outcome: Progressing   Problem: Pain Managment: Goal: General experience of comfort will improve Outcome: Progressing   Problem: Safety: Goal: Ability to remain free from injury will improve Outcome: Progressing   Problem: Skin Integrity: Goal: Risk for impaired skin integrity will decrease Outcome: Progressing

## 2022-12-23 NOTE — Progress Notes (Signed)
[facility] MD Progress Note  12/23/2022 11:55 AM Betty Henderson  MRN:  [id number] Subjective: Betty Henderson is seen on rounds.  Nurses report no issues.  Social work continues to work with DSS to find placement.  She has been very pleasant and cooperative on the unit.  Good controls.  No side effects from her medication.  She received her [place] last week. Principal Problem: Schizoaffective disorder, bipolar type (HCC) Diagnosis: Principal Problem:   Schizoaffective disorder, bipolar type (HCC)  Total Time spent with patient: 15 minutes  Past Psychiatric History: Schizoaffective disorder, bipolar type  Past Medical History:  Past Medical History:  Diagnosis Date   Anemia    Arthritis    Chronic pain    Drug-seeking behavior    Malingering    Osteopetrosis    Psychosis (HCC)    Schizoaffective disorder, bipolar type (HCC)     Past Surgical History:  Procedure Laterality Date   MOUTH SURGERY     TUBAL LIGATION     Family History:  Family History  Family history unknown: Yes   Family Psychiatric  History: Unremarkable Social History:  Social History   Substance and Sexual Activity  Alcohol Use Not Currently   Comment: 1 cocktail 3 weeks ago     Social History   Substance and Sexual Activity  Drug Use Not Currently   Types: Cocaine   Comment: states "it's legal though"    Social History   Socioeconomic History   Marital status: Single    Spouse name: Not on file   Number of children: Not on file   Years of education: Not on file   Highest education level: Not on file  Occupational History   Not on file  Tobacco Use   Smoking status: Every Day    Current packs/day: 0.50    Types: Cigarettes   Smokeless tobacco: Never  Vaping Use   Vaping status: Never Used  Substance and Sexual Activity   Alcohol use: Not Currently    Comment: 1 cocktail 3 weeks ago   Drug use: Not Currently    Types: Cocaine    Comment: states "it's legal though"   Sexual activity:  Never  Other Topics Concern   Not on file  Social History Narrative   Not on file   Social Determinants of Health   Financial Resource Strain: Not on file  Food Insecurity: Food Insecurity Present (08/22/2022)   Hunger Vital Sign    Worried About Running Out of Food in the Last Year: Sometimes true    Ran Out of Food in the Last Year: Sometimes true  Transportation Needs: Patient Unable To Answer (08/22/2022)   PRAPARE - Transportation    Lack of Transportation (Medical): Patient unable to answer    Lack of Transportation (Non-Medical): Patient unable to answer  Recent Concern: Transportation Needs - Unmet Transportation Needs (06/13/2022)   Received from [facility], [facility]   [facility] - Transportation    Lack of Transportation (Medical): Not on file    Lack of Transportation (Non-Medical): Yes  Physical Activity: Not on file  Stress: Not on file  Social Connections: Unknown (08/06/2021)   Received from [facility], [facility]   Social Network    Social Network: Not on file   Additional Social History:                         Sleep: Good  Appetite:  Good  Current Medications: Current Facility-Administered Medications  Medication Dose Route Frequency Provider Last Rate Last Admin   acetaminophen (TYLENOL) tablet 650 mg  650 mg Oral Q6H PRN [name], DO   650 mg at 12/22/22 1422   alum & mag hydroxide-simeth (MAALOX/MYLANTA) 200-200-20 MG/5ML suspension 30 mL  30 mL Oral Q4H PRN [name], DO   30 mL at 09/20/22 1142   diphenhydrAMINE (BENADRYL) capsule 50 mg  50 mg Oral Q6H PRN [name], DO   50 mg at 09/29/22 2254   Or   diphenhydrAMINE (BENADRYL) injection 50 mg  50 mg Intramuscular Q6H PRN [name], DO       haloperidol (HALDOL) tablet 5 mg  5 mg Oral Q6H PRN [name], DO   5 mg at 09/28/22 2149   Or   haloperidol lactate (HALDOL) injection 5 mg  5 mg Intramuscular Q6H PRN  [name], DO       ibuprofen (ADVIL) tablet 600 mg  600 mg Oral Q6H PRN [name], [name], MD   600 mg at 12/16/22 2107   LORazepam (ATIVAN) tablet 1 mg  1 mg Oral TID PRN [name], DO   1 mg at 09/27/22 0025   magnesium hydroxide (MILK OF MAGNESIA) suspension 30 mL  30 mL Oral Daily PRN [name], DO   30 mL at 11/08/22 1012   traZODone (DESYREL) tablet 50 mg  50 mg Oral QHS PRN [name], DO   50 mg at 12/23/22 0000    Lab Results: No results found for this or any previous visit (from the past 48 hour(s)).  Blood Alcohol level:  Lab Results  Component Value Date   ETH <10 08/17/2022   ETH <10 01/11/2021    Metabolic Disorder Labs: No results found for: "HGBA1C", "MPG" No results found for: "PROLACTIN" No results found for: "CHOL", "TRIG", "HDL", "CHOLHDL", "VLDL", "LDLCALC"  Physical Findings: AIMS:  , ,  ,  ,    CIWA:    COWS:     Musculoskeletal: Strength & Muscle Tone: within normal limits Gait & Station: normal Patient leans: N/A  Psychiatric Specialty Exam:  Presentation  General Appearance:  Casual; Neat  Eye Contact: Fair  Speech: Slow; Slurred  Speech Volume: Decreased  Handedness: Right   Mood and Affect  Mood: Anxious  Affect: Inappropriate   Thought Process  Thought Processes: Disorganized  Descriptions of Associations:Loose  Orientation:Partial  Thought Content:Illogical; Rumination  History of Schizophrenia/Schizoaffective disorder:No data recorded Duration of Psychotic Symptoms:No data recorded Hallucinations:No data recorded Ideas of Reference:Paranoia  Suicidal Thoughts:No data recorded Homicidal Thoughts:No data recorded  Sensorium  Memory: Immediate Fair; Remote Poor  Judgment: Poor  Insight: Poor   Executive Functions  Concentration: Poor  Attention Span: Fair  Recall: [employer] of Knowledge: Fair  Language: Fair   Psychomotor Activity   Psychomotor Activity:No data recorded  Assets  Assets: Communication Skills   Sleep  Sleep:No data recorded   MENTAL STATUS EXAM: Patient is alert and oriented x 3, pleasant and cooperative, good eye contact, speech is normal and not pressured, mood is euthymic; affect is flat; thought process: goal directed with some delusions; thought content: No suicidal ideation; judgment is fair, insight is poor. Blood pressure 116/71, pulse 72, temperature 98.5 F (36.9 C), resp. rate 18, height 5\' 6"  (1.676 m), weight (P) 82.8 kg, SpO2 94%. Body mass index is 29.46 kg/m (pended).   Treatment Plan Summary: Daily contact with patient to assess and evaluate symptoms  and progress in treatment, Medication management, and Plan continue current medications.  [name], DO 12/23/2022, 11:55 AM

## 2022-12-23 NOTE — Group Note (Signed)
Date:  12/23/2022 Time:  10:35 PM  Group Topic/Focus:  Wellness Toolbox:   The focus of this group is to discuss various aspects of wellness, balancing those aspects and exploring ways to increase the ability to experience wellness.  Patients will create a wellness toolbox for use upon discharge.    Participation Level:  Active  Participation Quality:  Appropriate  Affect:  Appropriate  Cognitive:  Appropriate  Insight: Improving  Engagement in Group:  Engaged  Modes of Intervention:  Discussion  Additional Comments:    [name] 12/23/2022, 10:35 PM

## 2022-12-23 NOTE — Group Note (Signed)
Recreation Therapy Group Note   Group Topic:General Recreation  Group Date: 12/23/2022 Start Time: 1400 End Time: 1445 Facilitators: Rosina Lowenstein, LRT, CTRS Location: Courtyard  Group Description: Outdoor Recreation. Patients had the option to play the card game rummy, ring toss, and listen to music while outside in the courtyard getting fresh air and sunlight. LRT and pts discussed things that they enjoy doing in their free time outside of the hospital.  Goal Area(s) Addressed: Patient will identify leisure interests.  Patient will practice healthy decision making. Patient will engage in recreation activity.   Affect/Mood: Appropriate   Participation Level: Active and Engaged   Participation Quality: Independent   Behavior: Appropriate, Calm, and Cooperative   Speech/Thought Process: Coherent   Insight: Fair   Judgement: Fair    Modes of Intervention: Activity   Patient Response to Interventions:  Receptive   Education Outcome:  Acknowledges education   Clinical Observations/Individualized Feedback: Betty Henderson was active in their participation of session activities and group discussion. Pt listened to music while in group and requested different songs to be played. Pt was singing along. Pt interacted well with LRT and peers duration of session.   Plan: Continue to engage patient in RT group sessions 2-3x/week.   [address] LRT, CTRS 12/23/2022 3:17 PM

## 2022-12-23 NOTE — Progress Notes (Signed)
Patient is a voluntary admission to [facility] for SAD/ Bipolar, awaiting placement. Patient is calm, cooperative, friendly and talkative today with staff and peers, participating in group. Performs her own ADL's, uses a pullup depends but changes it herself. Denies SI, HI, AVH, depression and anxiety. Will continue to monitor.

## 2022-12-24 DIAGNOSIS — F250 Schizoaffective disorder, bipolar type: Secondary | ICD-10-CM | POA: Diagnosis not present

## 2022-12-24 NOTE — Progress Notes (Signed)
[facility] MD Progress Note  Betty Henderson  MRN:  [id number]  Subjective: Case discussed in multidisciplinary meeting today, chart reviewed, patient seen today during rounds.  Social worker informed that patient has been assigned placement coordinator through DSS.  Social worker also informed today that DSS has recommended to have a meeting with patient's family, especially patient's son.  Apparently DSS wants patient's son to take over the guardianship. No behavioral issues reported by the staff.  Patient reports she is doing good today.  Patient today said that she talked to her son yesterday.  Patient reports that her son mentioned about having a meeting with DSS.  Patient was informed that DSS wants to have a meeting with her son and discussed guardianship.  Patient reports that she has been sleeping fine.  Reports her appetite is good.  Patient denies any thoughts of harming herself or others.  Patient denies auditory or visual hallucinations. Patient is awaiting placement.  Principal Problem: Schizoaffective disorder, bipolar type (HCC) Diagnosis: Principal Problem:   Schizoaffective disorder, bipolar type (HCC)   Past Psychiatric History: Schizoaffective disorder, bipolar type.  Past Medical History:  Past Medical History:  Diagnosis Date   Anemia    Arthritis    Chronic pain    Drug-seeking behavior    Malingering    Osteopetrosis    Psychosis (HCC)    Schizoaffective disorder, bipolar type (HCC)     Past Surgical History:  Procedure Laterality Date   MOUTH SURGERY     TUBAL LIGATION     Family History:  Family History  Family history unknown: Yes   Family Psychiatric  History: Unremarkable Social History:  Social History   Substance and Sexual Activity  Alcohol Use Not Currently   Comment: 1 cocktail 3 weeks ago     Social History   Substance and Sexual Activity  Drug Use Not Currently   Types: Cocaine   Comment: states "it's legal though"    Social History    Socioeconomic History   Marital status: Single    Spouse name: Not on file   Number of children: Not on file   Years of education: Not on file   Highest education level: Not on file  Occupational History   Not on file  Tobacco Use   Smoking status: Every Day    Current packs/day: 0.50    Types: Cigarettes   Smokeless tobacco: Never  Vaping Use   Vaping status: Never Used  Substance and Sexual Activity   Alcohol use: Not Currently    Comment: 1 cocktail 3 weeks ago   Drug use: Not Currently    Types: Cocaine    Comment: states "it's legal though"   Sexual activity: Never  Other Topics Concern   Not on file  Social History Narrative   Not on file   Social Determinants of Health   Financial Resource Strain: Not on file  Food Insecurity: Food Insecurity Present (08/22/2022)   Hunger Vital Sign    Worried About Running Out of Food in the Last Year: Sometimes true    Ran Out of Food in the Last Year: Sometimes true  Transportation Needs: Patient Unable To Answer (08/22/2022)   PRAPARE - Transportation    Lack of Transportation (Medical): Patient unable to answer    Lack of Transportation (Non-Medical): Patient unable to answer  Recent Concern: Transportation Needs - Unmet Transportation Needs (06/13/2022)   Received from [facility], [facility]   [facility] - Transportation    Lack  of Transportation (Medical): Not on file    Lack of Transportation (Non-Medical): Yes  Physical Activity: Not on file  Stress: Not on file  Social Connections: Unknown (08/06/2021)   Received from [facility], [facility]   Social Network    Social Network: Not on file   Additional Social History:     Patient is homeless at this time.  Social worker is working with DSS on [employer] application and also placement.                    Sleep: Good  Appetite:  Good  Current Medications: Current Facility-Administered Medications  Medication Dose Route Frequency Provider Last  Rate Last Admin   acetaminophen (TYLENOL) tablet 650 mg  650 mg Oral Q6H PRN [name], DO   650 mg at 12/23/22 2100   alum & mag hydroxide-simeth (MAALOX/MYLANTA) 200-200-20 MG/5ML suspension 30 mL  30 mL Oral Q4H PRN [name], DO   30 mL at 09/20/22 1142   diphenhydrAMINE (BENADRYL) capsule 50 mg  50 mg Oral Q6H PRN [name], DO   50 mg at 09/29/22 2254   Or   diphenhydrAMINE (BENADRYL) injection 50 mg  50 mg Intramuscular Q6H PRN [name], DO       haloperidol (HALDOL) tablet 5 mg  5 mg Oral Q6H PRN [name], DO   5 mg at 09/28/22 2149   Or   haloperidol lactate (HALDOL) injection 5 mg  5 mg Intramuscular Q6H PRN [name], DO       ibuprofen (ADVIL) tablet 600 mg  600 mg Oral Q6H PRN [name], [name], MD   600 mg at 12/16/22 2107   LORazepam (ATIVAN) tablet 1 mg  1 mg Oral TID PRN [name], DO   1 mg at 09/27/22 0025   magnesium hydroxide (MILK OF MAGNESIA) suspension 30 mL  30 mL Oral Daily PRN [name], DO   30 mL at 11/08/22 1012   traZODone (DESYREL) tablet 50 mg  50 mg Oral QHS PRN [name], DO   50 mg at 12/23/22 2100    Lab Results: No results found for this or any previous visit (from the past 48 hour(s)).  Blood Alcohol level:  Lab Results  Component Value Date   ETH <10 08/17/2022   ETH <10 01/11/2021      Musculoskeletal: Strength & Muscle Tone: within normal limits Gait & Station: normal Patient leans: N/A   Psychiatric Specialty Exam:   Presentation  General Appearance:  Casual; Neat   Eye Contact: Fair   Speech: Spontaneous   Speech Volume: Normal   Handedness: Right     Mood and Affect  Mood: "Good"   Affect: Stable    Thought Processes: Improved,probably at baseline.    Descriptions of Associations: Intact   Orientation: Well-oriented   Thought Content:Denies SI/HI, at baseline   Hallucinations:Denies  ,   Ideas of Reference:None noted   Suicidal Thoughts:Denies SI  Homicidal Thoughts:Denies HI   Sensorium  Memory: Immediate Fair; Remote Poor   Judgment: Fair, pt is Rx compliant   Insight: Improved     Executive Functions  Concentration: Improved   Attention Span: Improved   Language: Fair     Psychomotor Activity  Psychomotor Activity: Normal  Assets  Assets: Communication Skills     Sleep  Sleep:Improved     Physical Exam: Physical Exam Vitals and nursing note reviewed.  Constitutional:  Appearance: Normal appearance. She is normal weight.  Neurological:     General: No focal deficit present.     Mental Status: She is alert.      Review of Systems  Constitutional: Negative.   HENT: Negative.    Eyes: Negative.   Respiratory: Negative.    Cardiovascular: Negative.   Gastrointestinal: Negative.   Genitourinary: Negative.   Musculoskeletal: Negative.   Skin: Negative.   Neurological: Negative.   Endo/Heme/Allergies: Negative.     Blood pressure 106/71, pulse 71, temperature (!) 97 F (36.1 C), resp. rate 18, height 5\' 6"  (1.676 m), weight (P) 82.8 kg, SpO2 98%. Body mass index is 29.46 kg/m (pended).   Treatment Plan Summary: Daily contact with patient to assess and evaluate symptoms and progress in treatment, Medication management, and Plan continue current medications.  Continue to monitor patient on as needed meds   Patient received  dose of Invega Sustenna 156 mg IM on 12/18/2022 Repeat every 4 weeks    [name], MD

## 2022-12-24 NOTE — Group Note (Signed)
Recreation Therapy Group Note   Group Topic:Leisure Education  Group Date: 12/24/2022 Start Time: 1400 End Time: 1455 Facilitators: Rosina Lowenstein, LRT, CTRS Location: Courtyard  Group Description: Music. Patients encouraged to name their favorite song(s) for LRT to play song through speaker for group to hear. Patient educated on the definition of leisure and the importance of having different leisure interests outside of the hospital. Group discussed how leisure activities can often be used as Pharmacologist and that listening to music is one example.   Goal Area(s) Addressed:  Patient will identify a current leisure interest.  Patient will practice making a positive decision. Patient will have the opportunity to try a new leisure activity.   Affect/Mood: Appropriate   Participation Level: Active   Participation Quality: Independent   Behavior: Calm and Cooperative   Speech/Thought Process: Coherent   Insight: Fair   Judgement: Fair    Modes of Intervention: Activity   Patient Response to Interventions:  Attentive, Engaged, Interested , and Receptive   Education Outcome:  Acknowledges education   Clinical Observations/Individualized Feedback: Jaylyn was active in their participation of session activities and group discussion. Pt identified "Who's lovin you" by Jean Rosenthal 5 as her favorite song. Pt interacted well with LRT and peers duration of session.   Plan: Continue to engage patient in RT group sessions 2-3x/week.   Rosina Lowenstein, LRT, CTRS 12/24/2022 3:25 PM

## 2022-12-24 NOTE — Progress Notes (Incomplete)
   12/24/22 2120  Psych Admission Type (Psych Patients Only)  Admission Status Voluntary  Psychosocial Assessment  Patient Complaints None  Eye Contact Fair  Facial Expression Flat  Affect Flat  Speech Soft  Interaction Assertive  Motor Activity Shuffling  Appearance/Hygiene Revealing clothes/seductive clothing (given gown to wear over her nightgown in the dayroom)  Behavior Characteristics Cooperative  Mood Pleasant  Thought Process  Coherency Tangential  Content WDL  Delusions None reported or observed  Perception WDL  Hallucination None reported or observed  Judgment WDL  Confusion None  Danger to Self  Current suicidal ideation? Denies  Agreement Not to Harm Self Yes  Description of Agreement verbal  Danger to Others  Danger to Others None reported or observed   Progress note   D: Pt seen in dayroom. Pt denies SI, HI, AVH. Pt rates pain  /10. Pt rates anxiety  /10 and depression  /10. No other concerns noted at this time.  A: Pt provided support and encouragement. Pt given scheduled medication as prescribed. PRNs as appropriate. Q15 min checks for safety.   R: Pt safe on the unit. Will continue to monitor.

## 2022-12-24 NOTE — BH IP Treatment Plan (Signed)
Interdisciplinary Treatment and Diagnostic Plan Update  12/24/2022 Time of Session: 9:30 AM  Betty Henderson MRN: [id number]  Principal Diagnosis: Schizoaffective disorder, bipolar type (HCC)  Secondary Diagnoses: Principal Problem:   Schizoaffective disorder, bipolar type (HCC)   Current Medications:  Current Facility-Administered Medications  Medication Dose Route Frequency Provider Last Rate Last Admin   acetaminophen (TYLENOL) tablet 650 mg  650 mg Oral Q6H PRN [name], DO   650 mg at 12/23/22 2100   alum & mag hydroxide-simeth (MAALOX/MYLANTA) 200-200-20 MG/5ML suspension 30 mL  30 mL Oral Q4H PRN [name], DO   30 mL at 09/20/22 1142   diphenhydrAMINE (BENADRYL) capsule 50 mg  50 mg Oral Q6H PRN [name], DO   50 mg at 09/29/22 2254   Or   diphenhydrAMINE (BENADRYL) injection 50 mg  50 mg Intramuscular Q6H PRN [name], DO       haloperidol (HALDOL) tablet 5 mg  5 mg Oral Q6H PRN [name], DO   5 mg at 09/28/22 2149   Or   haloperidol lactate (HALDOL) injection 5 mg  5 mg Intramuscular Q6H PRN [name], DO       ibuprofen (ADVIL) tablet 600 mg  600 mg Oral Q6H PRN [name], [name], MD   600 mg at 12/16/22 2107   LORazepam (ATIVAN) tablet 1 mg  1 mg Oral TID PRN [name], DO   1 mg at 09/27/22 0025   magnesium hydroxide (MILK OF MAGNESIA) suspension 30 mL  30 mL Oral Daily PRN [name], DO   30 mL at 11/08/22 1012   traZODone (DESYREL) tablet 50 mg  50 mg Oral QHS PRN [name], DO   50 mg at 12/23/22 2100   PTA Medications: Medications Prior to Admission  Medication Sig Dispense Refill Last Dose   ondansetron (ZOFRAN) 4 MG tablet Take 1 tablet (4 mg total) by mouth every 8 (eight) hours as needed for nausea or vomiting. (Patient not taking: Reported on 08/17/2022) 20 tablet 0     Patient Stressors: Financial difficulties   Health problems    Medication change or noncompliance    Patient Strengths: Ability for insight   Treatment Modalities: Medication Management, Group therapy, Case management,  1 to 1 session with clinician, Psychoeducation, Recreational therapy.   Physician Treatment Plan for Primary Diagnosis: Schizoaffective disorder, bipolar type (HCC) Long Term Goal(s): Improvement in symptoms so as ready for discharge   Short Term Goals: Ability to identify changes in lifestyle to reduce recurrence of condition will improve Ability to verbalize feelings will improve Ability to disclose and discuss suicidal ideas Ability to demonstrate self-control will improve Ability to identify and develop effective coping behaviors will improve Ability to maintain clinical measurements within normal limits will improve Compliance with prescribed medications will improve Ability to identify triggers associated with substance abuse/mental health issues will improve  Medication Management: Evaluate patient's response, side effects, and tolerance of medication regimen.  Therapeutic Interventions: 1 to 1 sessions, Unit Group sessions and Medication administration.  Evaluation of Outcomes: Progressing  Physician Treatment Plan for Secondary Diagnosis: Principal Problem:   Schizoaffective disorder, bipolar type (HCC)  Long Term Goal(s): Improvement in symptoms so as ready for discharge   Short Term Goals: Ability to identify changes in lifestyle to reduce recurrence of condition will improve Ability to verbalize feelings will improve Ability to disclose and discuss suicidal ideas Ability to demonstrate self-control will improve Ability to identify  and develop effective coping behaviors will improve Ability to maintain clinical measurements within normal limits will improve Compliance with prescribed medications will improve Ability to identify triggers associated with substance abuse/mental health issues will improve      Medication Management: Evaluate patient's response, side effects, and tolerance of medication regimen.  Therapeutic Interventions: 1 to 1 sessions, Unit Group sessions and Medication administration.  Evaluation of Outcomes: Progressing   RN Treatment Plan for Primary Diagnosis: Schizoaffective disorder, bipolar type (HCC) Long Term Goal(s): Knowledge of disease and therapeutic regimen to maintain health will improve  Short Term Goals: Ability to remain free from injury will improve, Ability to verbalize frustration and anger appropriately will improve, Ability to demonstrate self-control, Ability to participate in decision making will improve, Ability to verbalize feelings will improve, Ability to disclose and discuss suicidal ideas, Ability to identify and develop effective coping behaviors will improve, and Compliance with prescribed medications will improve  Medication Management: RN will administer medications as ordered by provider, will assess and evaluate patient's response and provide education to patient for prescribed medication. RN will report any adverse and/or side effects to prescribing provider.  Therapeutic Interventions: 1 on 1 counseling sessions, Psychoeducation, Medication administration, Evaluate responses to treatment, Monitor vital signs and CBGs as ordered, Perform/monitor CIWA, COWS, AIMS and Fall Risk screenings as ordered, Perform wound care treatments as ordered.  Evaluation of Outcomes: Progressing   LCSW Treatment Plan for Primary Diagnosis: Schizoaffective disorder, bipolar type (HCC) Long Term Goal(s): Safe transition to appropriate next level of care at discharge, Engage patient in therapeutic group addressing interpersonal concerns.  Short Term Goals: Engage patient in aftercare planning with referrals and resources, Increase social support, Increase ability to appropriately verbalize feelings, Increase emotional regulation, Facilitate acceptance of mental  health diagnosis and concerns, Facilitate patient progression through stages of change regarding substance use diagnoses and concerns, Identify triggers associated with mental health/substance abuse issues, and Increase skills for wellness and recovery  Therapeutic Interventions: Assess for all discharge needs, 1 to 1 time with Social worker, Explore available resources and support systems, Assess for adequacy in community support network, Educate family and significant other(s) on suicide prevention, Complete Psychosocial Assessment, Interpersonal group therapy.  Evaluation of Outcomes: Progressing   Progress in Treatment: Attending groups: Yes. and No. Participating in groups: Yes. and No. Taking medication as prescribed: Yes. Toleration medication: Yes. Family/Significant other contact made: Yes, individual(s) contacted:  Elainna Eshleman [phone] Patient understands diagnosis: Yes. Discussing patient identified problems/goals with staff: Yes. Medical problems stabilized or resolved: Yes. Denies suicidal/homicidal ideation: Yes. Issues/concerns per patient self-inventory: No. Other: None    New problem(s) identified: No, Describe:  none Updates 12/12/22: No changes made at this time Updates 12/17/22: No changes made at this time Update 12/24/22 No changes at this time        New Short Term/Long Term Goal(s): Update 6/30: none at this time. Update 09/27/2022:  No changes at this time.  Update 10/02/2022:  No changes at this time. Update 10/07/22: No changes at this time 10/12/22: No changes at this time Update 10/18/22: No changes at this time Update 10/23/22: No changes at this time Update 10/28/22: No changes at this time Update 11/07/22: No changes at this time Update 11/12/22: No changes at this time  Update 11/17/22: None at this time. Update 11/22/22: None at this time. Update 11/27/22 No changes at this time. Update 12/02/22 No changes at this time  Update 12/07/22: None at this time. Updates  12/12/22: No changes  made at this time Updates 12/17/22: No changes made at this time Update 12/24/22 No changes at this time        Patient Goals:  Update 6/30: none at this time. Update 09/27/2022:  No changes at this time. Update 10/02/2022:  No changes at this time. Update 10/07/22: No changes at this time 10/12/22: No changes at this time Update 10/18/22: No changes at this time Update 10/23/22: No changes at this time Update 10/28/22: No changes at this time Update 11/07/22: No changes at this time Update 11/12/22: No changes at this time Update 11/17/22: None at this time. Update 11/22/22: None at this time. Update 11/27/22 No changes at this time Update 12/02/22 No changes at this time   Update 12/07/22: None at this time. Updates 12/12/22: No changes made at this time Updates 12/17/22: No changes made at this time Update 12/24/22 No changes at this time      Discharge Plan or Barriers: Update 6/30: APS report has been made and patient is being investigated for guardianship needs.  Remains homeless with limited supports. Update 09/27/2022:  No changes at this time.  Update 10/02/2022:  Patient remains safe on the unit at this time.  Patient remains psychotic at this time.  APS report has been made, however, no follow up from the caseworker on her case.  No safe discharge identified.  CSW has requested that application for Medicaid and disability be completed.   Update 10/07/22: No changes at this time 10/12/22: No changes at this time Update 10/18/22: No changes at this time Update 10/23/22: No changes at this time Update 10/23/22: No changes at this time Update 10/28/22: According to [facility], pt's caseworker at DSS, the petition was filed for guardianship on 10/21/22, now awaiting for the petition to be approvedUpdate 11/07/22: No changes at this time Update 11/12/22: CSW sent over patients information to [facility] and [facility], awaiting a response. CSW awaiting word from DSS regarding pt's petition and what agency will  become her guardian. Update 11/17/22: No changes at this time. Update 11/22/22: CSW has sent patients information to multiple facilities that were suggested by leadership, pt has been denied from the facility or the facility does not respond. CSW continues to send pt's information to nursing homes. Update 11/27/22 CSW contacted DSS to inquire about the status of guardianship. No response at this time Update 12/02/22 Supervisor Loraine Leriche contacted DSS who stated they are not taking pt on for guardianship but that Quincy Carnes will be present tomorrow to do a visit with social worker and pt and that pt's son will be signing documentation for placement for the pt. Update 12/07/22: [facility] DSS has declined to take guardianship. Son, Casimiro Needle is to step into a more present role in deciding placement. A conversation is still needed. Updates 12/12/22: CSW staffed case with leadership and they state that a family meeting must happen with pt's son. CSW contacted pt's financial navigator and her DSS worker so that they may be present on the call and are able to speak to the efforts they have made to secure pt funding and housing. CSW waiting on responses from both, leadership is aware. Updates 12/17/22: CSW awaiting updates from both [facility] DSS and [facility] financial navigators as to status of pt's applications for Medicaid/[employer] and Disability, respectively. Update 12/24/22 CSW sent email regarding family meeting per request of supervisors. CSW awaiting responses from all parties to schedule family meeting.    Reason for Continuation of  Hospitalization: Hallucinations Mania Medication stabilization   Estimated Length of Stay:  Update 6/30: 1-7 days Update 09/27/2022:  TBD Update 10/02/2022:  No changes at this time. UpdaUpdate 10/18/22: No changes at this time te 10/07/22: No changes at this time Update 10/18/22: No changes at this time Update 10/23/22: No changes at this time Update 10/28/22: No changes at  this time Update 11/07/22: No changes at this time Update 11/12/22: No changes at this time  Update 11/17/22: No changes at this time. Update 11/22/22: None at this time. Update 11/27/22 No changes at this time. Update 12/02/22 No changes at this time  Update 12/07/22: None at this time. Updates 12/12/22: No changes made at this time Updates 12/17/22: TBD   Last 3 [place] Suicide Severity Risk Score: Flowsheet Row Admission (Current) from 08/22/2022 in [facility] BE[facility] ED from 08/17/2022 in [facility] Emergency Department at [facility] ED from 08/12/2022 in [facility] Emergency Department at [facility]  C-SSRS RISK CATEGORY Low Risk No Risk No Risk       Last [facility] 2/9 Scores:     No data to display          Scribe for Treatment Team: Elza Rafter, Theresia Majors 12/24/2022 12:24 PM

## 2022-12-24 NOTE — Plan of Care (Signed)
  Problem: Education: Goal: Knowledge of General Education information will improve Description Including pain rating scale, medication(s)/side effects and non-pharmacologic comfort measures Outcome: Progressing   Problem: Health Behavior/Discharge Planning: Goal: Ability to manage health-related needs will improve Outcome: Progressing   

## 2022-12-24 NOTE — Progress Notes (Signed)
Patient originally IVC on Aug 22, 2022 to [facility] for worsening psychosis and aggressive behavior. She is now a voluntary admission awaiting placement.   Patient denies anxiety and depression. She denies SI/HI/AVH. She denies pain. Patient has no scheduled medications on either shift but usually requests Trazodone at night.  Patient has somewhat of a complicated situation with DSS mixed with awaiting for financial 'clearance'.  Q15 minute unit checks in place.

## 2022-12-24 NOTE — Progress Notes (Signed)
   12/24/22 2120  Psych Admission Type (Psych Patients Only)  Admission Status Voluntary  Psychosocial Assessment  Patient Complaints None  Eye Contact Fair  Facial Expression Flat  Affect Flat  Speech Soft  Interaction Assertive  Motor Activity Shuffling  Appearance/Hygiene Revealing clothes/seductive clothing (given gown to wear over her nightgown in the dayroom)  Behavior Characteristics Cooperative  Mood Pleasant  Thought Process  Coherency Tangential  Content WDL  Delusions None reported or observed  Perception WDL  Hallucination None reported or observed  Judgment WDL  Confusion None  Danger to Self  Current suicidal ideation? Denies  Agreement Not to Harm Self Yes  Description of Agreement verbal  Danger to Others  Danger to Others None reported or observed   Progress note   D: Pt seen in dayroom. Pt denies SI, HI, AVH. Pt rates pain 10/10 as pain in her left leg. Pt rates anxiety  0/10 and depression  0/10. No other concerns noted at this time.  A: Pt provided support and encouragement. Pt given scheduled medication as prescribed. PRNs as appropriate. Q15 min checks for safety.   R: Pt safe on the unit. Will continue to monitor.

## 2022-12-24 NOTE — Group Note (Signed)
Date:  12/24/2022 Time:  10:42 PM  Group Topic/Focus:  Goals Group:   The focus of this group is to help patients establish daily goals to achieve during treatment and discuss how the patient can incorporate goal setting into their daily lives to aide in recovery.    Participation Level:  Minimal  Participation Quality:  Appropriate  Affect:  Appropriate  Cognitive:  Appropriate  Insight: Improving  Engagement in Group:  Improving  Modes of Intervention:  Discussion  Additional Comments:    [name] 12/24/2022, 10:42 PM

## 2022-12-24 NOTE — Group Note (Signed)
Date:  12/24/2022 Time:  10:34 AM  Group Topic/Focus:  Goals Group:   The focus of this group is to help patients establish daily goals to achieve during treatment and discuss how the patient can incorporate goal setting into their daily lives to aide in recovery.    Participation Level:  Did Not Attend   Rodena Goldmann 12/24/2022, 10:34 AM

## 2022-12-24 NOTE — Progress Notes (Signed)

## 2022-12-25 DIAGNOSIS — F250 Schizoaffective disorder, bipolar type: Secondary | ICD-10-CM | POA: Diagnosis not present

## 2022-12-25 NOTE — Progress Notes (Signed)
[facility] MD Progress Note  Towanda Hornstein  MRN:  [id number]  Subjective: Case discussed in multidisciplinary meeting today, chart reviewed, patient seen today during rounds.  Social worker informed that patient has been assigned placement coordinator through DSS.  Social worker also informed today that DSS has recommended to have a meeting with patient's family, especially patient's son.  Apparently DSS wants patient's son to take over the guardianship. No behavioral issues reported by the staff.  Patient reports she is doing fine today.  No new acute event reported by staff or patient.  Patient reports her appetite is good.  Patient denies any thoughts of harming herself or others.  Patient denies auditory or visual hallucinations. Patient is awaiting placement.  Principal Problem: Schizoaffective disorder, bipolar type (HCC) Diagnosis: Principal Problem:   Schizoaffective disorder, bipolar type (HCC)   Past Psychiatric History: Schizoaffective disorder, bipolar type.  Past Medical History:  Past Medical History:  Diagnosis Date   Anemia    Arthritis    Chronic pain    Drug-seeking behavior    Malingering    Osteopetrosis    Psychosis (HCC)    Schizoaffective disorder, bipolar type (HCC)     Past Surgical History:  Procedure Laterality Date   MOUTH SURGERY     TUBAL LIGATION     Family History:  Family History  Family history unknown: Yes   Family Psychiatric  History: Unremarkable Social History:  Social History   Substance and Sexual Activity  Alcohol Use Not Currently   Comment: 1 cocktail 3 weeks ago     Social History   Substance and Sexual Activity  Drug Use Not Currently   Types: Cocaine   Comment: states "it's legal though"    Social History   Socioeconomic History   Marital status: Single    Spouse name: Not on file   Number of children: Not on file   Years of education: Not on file   Highest education level: Not on file  Occupational History    Not on file  Tobacco Use   Smoking status: Every Day    Current packs/day: 0.50    Types: Cigarettes   Smokeless tobacco: Never  Vaping Use   Vaping status: Never Used  Substance and Sexual Activity   Alcohol use: Not Currently    Comment: 1 cocktail 3 weeks ago   Drug use: Not Currently    Types: Cocaine    Comment: states "it's legal though"   Sexual activity: Never  Other Topics Concern   Not on file  Social History Narrative   Not on file   Social Determinants of Health   Financial Resource Strain: Not on file  Food Insecurity: Food Insecurity Present (08/22/2022)   Hunger Vital Sign    Worried About Running Out of Food in the Last Year: Sometimes true    Ran Out of Food in the Last Year: Sometimes true  Transportation Needs: Patient Unable To Answer (08/22/2022)   PRAPARE - Transportation    Lack of Transportation (Medical): Patient unable to answer    Lack of Transportation (Non-Medical): Patient unable to answer  Recent Concern: Transportation Needs - Unmet Transportation Needs (06/13/2022)   [facility] from [facility], [facility]   [facility] - Transportation    Lack of Transportation (Medical): Not on file    Lack of Transportation (Non-Medical): Yes  Physical Activity: Not on file  Stress: Not on file  Social Connections: Unknown (08/06/2021)   Received from [facility], [facility]  Social Network    Social Network: Not on file   Additional Social History:     Patient is homeless at this time.  Social worker is working with DSS on [employer] application and also placement.                    Sleep: Good  Appetite:  Good  Current Medications: Current Facility-Administered Medications  Medication Dose Route Frequency Provider Last Rate Last Admin   acetaminophen (TYLENOL) tablet 650 mg  650 mg Oral Q6H PRN [name], DO   650 mg at 12/24/22 2119   alum & mag hydroxide-simeth (MAALOX/MYLANTA) 200-200-20 MG/5ML suspension 30 mL  30  mL Oral Q4H PRN [name], DO   30 mL at 09/20/22 1142   diphenhydrAMINE (BENADRYL) capsule 50 mg  50 mg Oral Q6H PRN [name], DO   50 mg at 09/29/22 2254   Or   diphenhydrAMINE (BENADRYL) injection 50 mg  50 mg Intramuscular Q6H PRN [name], DO       haloperidol (HALDOL) tablet 5 mg  5 mg Oral Q6H PRN [name], DO   5 mg at 09/28/22 2149   Or   haloperidol lactate (HALDOL) injection 5 mg  5 mg Intramuscular Q6H PRN [name], DO       ibuprofen (ADVIL) tablet 600 mg  600 mg Oral Q6H PRN [name], [name], MD   600 mg at 12/16/22 2107   LORazepam (ATIVAN) tablet 1 mg  1 mg Oral TID PRN [name], DO   1 mg at 09/27/22 0025   magnesium hydroxide (MILK OF MAGNESIA) suspension 30 mL  30 mL Oral Daily PRN [name], DO   30 mL at 11/08/22 1012   traZODone (DESYREL) tablet 50 mg  50 mg Oral QHS PRN [name], DO   50 mg at 12/24/22 2119    Lab Results: No results found for this or any previous visit (from the past 48 hour(s)).  Blood Alcohol level:  Lab Results  Component Value Date   ETH <10 08/17/2022   ETH <10 01/11/2021      Musculoskeletal: Strength & Muscle Tone: within normal limits Gait & Station: normal Patient leans: N/A   Psychiatric Specialty Exam:   Presentation  General Appearance:  Casual; Neat   Eye Contact: Fair   Speech: Spontaneous   Speech Volume: Normal   Handedness: Right     Mood and Affect  Mood: "Good"   Affect: Stable    Thought Processes: Improved,probably at baseline.    Descriptions of Associations: Intact   Orientation: Well-oriented   Thought Content:Denies SI/HI, at baseline   Hallucinations:Denies ,   Ideas of Reference:None noted   Suicidal Thoughts:Denies SI  Homicidal Thoughts:Denies HI   Sensorium  Memory: Immediate Fair; Remote Poor   Judgment: Fair, pt is Rx compliant   Insight: Improved      Executive Functions  Concentration: Improved   Attention Span: Improved   Language: Fair     Psychomotor Activity  Psychomotor Activity: Normal  Assets  Assets: Communication Skills     Sleep  Sleep:Improved     Physical Exam: Physical Exam Vitals and nursing note reviewed.  Constitutional:      Appearance: Normal appearance. She is normal weight.  Neurological:     General: No focal deficit present.     Mental Status: She is alert.      Review of Systems  Constitutional:  Negative.   HENT: Negative.    Eyes: Negative.   Respiratory: Negative.    Cardiovascular: Negative.   Gastrointestinal: Negative.   Genitourinary: Negative.   Musculoskeletal: Negative.   Skin: Negative.   Neurological: Negative.   Endo/Heme/Allergies: Negative.     Blood pressure 100/61, pulse 76, temperature 98.5 F (36.9 C), resp. rate 18, height 5\' 6"  (1.676 m), weight (P) 82.8 kg, SpO2 97%. Body mass index is 29.46 kg/m (pended).   Treatment Plan Summary: Daily contact with patient to assess and evaluate symptoms and progress in treatment, Medication management, and Plan continue current medications.  Continue to monitor patient on as needed meds   Patient received  dose of Invega Sustenna 156 mg IM on 12/18/2022 Repeat every 4 weeks    [name], MD

## 2022-12-25 NOTE — Progress Notes (Signed)
   12/25/22 0556  15 Minute Checks  Location Bedroom  Visual Appearance Calm  Behavior Sleeping  Sleep (Behavioral Health Patients Only)  Calculate sleep? (Click Yes once per 24 hr at 0600 safety check) Yes  Documented sleep last 24 hours 11.75

## 2022-12-25 NOTE — Plan of Care (Signed)

## 2022-12-25 NOTE — Progress Notes (Signed)
Patient originally IVC on Aug 22, 2022 to [facility] for worsening psychosis and aggressive behavior. She is now a voluntary admission awaiting placement.   Patient denies SI/HI/AVH. She denies anxiety and depression. Pain 0/10. Patient prefers to isolate in her room but will appear for meals, snacks, and groups. No complaints noted or voiced.  Q15 minute unit checks in place.

## 2022-12-25 NOTE — Progress Notes (Signed)
   12/25/22 2150  Psych Admission Type (Psych Patients Only)  Admission Status Voluntary  Psychosocial Assessment  Patient Complaints None  Eye Contact Brief  Facial Expression Flat  Affect Flat  Speech Soft  Interaction Minimal  Motor Activity Shuffling  Appearance/Hygiene In scrubs  Behavior Characteristics Cooperative  Mood Pleasant  Thought Process  Coherency WDL  Content WDL  Delusions None reported or observed  Perception WDL  Hallucination None reported or observed  Judgment WDL  Confusion None  Danger to Self  Current suicidal ideation? Denies  Danger to Others  Danger to Others None reported or observed   Progress note   D: Pt seen in her room. Pt denies SI, HI, AVH. Pt rates pain  9/10 at chronic pain in bilateral knees. Pt rates anxiety  0/10 and depression  0/10. Pt still waiting for placement. Has been seen in dayroom in milieu. No other concerns noted at this time.  A: Pt provided support and encouragement. Pt given scheduled medication as prescribed. PRNs as appropriate. Q15 min checks for safety.   R: Pt safe on the unit. Will continue to monitor.

## 2022-12-25 NOTE — Group Note (Signed)
[facility] LCSW Group Therapy Note   Group Date: 12/25/2022 Start Time: 1400 End Time: 1440   Type of Therapy/Topic:  Group Therapy:  Balance in Life  Participation Level:  Active   Description of Group:    This group will address the concept of balance and how it feels and looks when one is unbalanced. Patients will be encouraged to process areas in their lives that are out of balance, and identify reasons for remaining unbalanced. Facilitators will guide patients utilizing problem- solving interventions to address and correct the stressor making their life unbalanced. Understanding and applying boundaries will be explored and addressed for obtaining  and maintaining a balanced life. Patients will be encouraged to explore ways to assertively make their unbalanced needs known to significant others in their lives, using other group members and facilitator for support and feedback.  Therapeutic Goals: Patient will identify two or more emotions or situations they have that consume much of in their lives. Patient will identify signs/triggers that life has become out of balance:  Patient will identify two ways to set boundaries in order to achieve balance in their lives:  Patient will demonstrate ability to communicate their needs through discussion and/or role plays  Summary of Patient Progress:    Betty Henderson participated throughout the entirety of group. Betty Henderson discussed her struggles with discharge planning and wishing her son helped her out more. She states she enjoys listening to R&B music.     Therapeutic Modalities:   Cognitive Behavioral Therapy Solution-Focused Therapy Assertiveness Training   Elza Rafter, [place]

## 2022-12-25 NOTE — Plan of Care (Signed)
  Problem: Health Behavior/Discharge Planning: Goal: Ability to manage health-related needs will improve Outcome: Progressing   Problem: Clinical Measurements: Goal: Ability to maintain clinical measurements within normal limits will improve Outcome: Progressing   Problem: Nutrition: Goal: Adequate nutrition will be maintained Outcome: Progressing   Problem: Coping: Goal: Level of anxiety will decrease Outcome: Progressing   Problem: Elimination: Goal: Will not experience complications related to bowel motility Outcome: Progressing Goal: Will not experience complications related to urinary retention Outcome: Progressing   Problem: Pain Managment: Goal: General experience of comfort will improve Outcome: Progressing

## 2022-12-25 NOTE — Group Note (Signed)
Date:  12/25/2022 Time:  8:55 PM  Group Topic/Focus:  Healthy Communication:   The focus of this group is to discuss communication, barriers to communication, as well as healthy ways to communicate with others.    Participation Level:  Active  Participation Quality:  Appropriate and Sharing  Affect:  Appropriate  Cognitive:  Alert and Appropriate  Insight: Appropriate and Good  Engagement in Group:  Engaged  Modes of Intervention:  Support  Additional Comments:    Jeannette How 12/25/2022, 8:55 PM

## 2022-12-25 NOTE — Group Note (Signed)
Date:  12/25/2022 Time:  11:13 AM  Group Topic/Focus:  Self Care:   The focus of this group is to help patients understand the importance of self-care in order to improve or restore emotional, physical, spiritual, interpersonal, and financial health.    Participation Level:  Active  Participation Quality:  Appropriate  Affect:  Appropriate  Cognitive:  Appropriate  Insight: Appropriate  Engagement in Group:  Engaged  Modes of Intervention:  Activity and Discussion  Additional Comments:  none  Rodena Goldmann 12/25/2022, 11:13 AM

## 2022-12-26 DIAGNOSIS — F250 Schizoaffective disorder, bipolar type: Secondary | ICD-10-CM | POA: Diagnosis not present

## 2022-12-26 NOTE — Plan of Care (Signed)
  Problem: Education: Goal: Knowledge of General Education information will improve Description: Including pain rating scale, medication(s)/side effects and non-pharmacologic comfort measures Outcome: Progressing   Problem: Nutrition: Goal: Adequate nutrition will be maintained Outcome: Progressing   Problem: Coping: Goal: Level of anxiety will decrease Outcome: Progressing   

## 2022-12-26 NOTE — Progress Notes (Signed)
[facility] MD Progress Note  Betty Henderson  MRN:  [id number]  Subjective: Case discussed in multidisciplinary meeting today, chart reviewed, patient seen today during rounds.  Social worker informed that patient has been assigned placement coordinator through DSS.  Social worker also informed today that DSS has recommended to have a meeting with patient's family, especially patient's son.  Apparently DSS wants patient's son to take over the guardianship. No behavioral issues reported by the staff.  Patient reports she is doing fine today.  No new acute event reported by staff or patient.  Patient reports her appetite is good.  Patient denies any thoughts of harming herself or others.  Patient denies auditory or visual hallucinations. Patient is awaiting placement.  Principal Problem: Schizoaffective disorder, bipolar type (HCC) Diagnosis: Principal Problem:   Schizoaffective disorder, bipolar type (HCC)   Past Psychiatric History: Schizoaffective disorder, bipolar type.  Past Medical History:  Past Medical History:  Diagnosis Date   Anemia    Arthritis    Chronic pain    Drug-seeking behavior    Malingering    Osteopetrosis    Psychosis (HCC)    Schizoaffective disorder, bipolar type (HCC)     Past Surgical History:  Procedure Laterality Date   MOUTH SURGERY     TUBAL LIGATION     Family History:  Family History  Family history unknown: Yes   Family Psychiatric  History: Unremarkable Social History:  Social History   Substance and Sexual Activity  Alcohol Use Not Currently   Comment: 1 cocktail 3 weeks ago     Social History   Substance and Sexual Activity  Drug Use Not Currently   Types: Cocaine   Comment: states "it's legal though"    Social History   Socioeconomic History   Marital status: Single    Spouse name: Not on file   Number of children: Not on file   Years of education: Not on file   Highest education level: Not on file  Occupational History    Not on file  Tobacco Use   Smoking status: Every Day    Current packs/day: 0.50    Types: Cigarettes   Smokeless tobacco: Never  Vaping Use   Vaping status: Never Used  Substance and Sexual Activity   Alcohol use: Not Currently    Comment: 1 cocktail 3 weeks ago   Drug use: Not Currently    Types: Cocaine    Comment: states "it's legal though"   Sexual activity: Never  Other Topics Concern   Not on file  Social History Narrative   Not on file   Social Determinants of Health   Financial Resource Strain: Not on file  Food Insecurity: Food Insecurity Present (08/22/2022)   Hunger Vital Sign    Worried About Running Out of Food in the Last Year: Sometimes true    Ran Out of Food in the Last Year: Sometimes true  Transportation Needs: Patient Unable To Answer (08/22/2022)   PRAPARE - Transportation    Lack of Transportation (Medical): Patient unable to answer    Lack of Transportation (Non-Medical): Patient unable to answer  Recent Concern: Transportation Needs - Unmet Transportation Needs (06/13/2022)   Received from [facility], [facility]   [facility] - Transportation    Lack of Transportation (Medical): Not on file    Lack of Transportation (Non-Medical): Yes  Physical Activity: Not on file  Stress: Not on file  Social Connections: Unknown (08/06/2021)   Received from [facility], [facility]  Social Network    Social Network: Not on file   Additional Social History:     Patient is homeless at this time.  Social worker is working with DSS on [employer] application and also placement.                    Sleep: Good  Appetite:  Good  Current Medications: Current Facility-Administered Medications  Medication Dose Route Frequency Provider Last Rate Last Admin   acetaminophen (TYLENOL) tablet 650 mg  650 mg Oral Q6H PRN [name], DO   650 mg at 12/24/22 2119   alum & mag hydroxide-simeth (MAALOX/MYLANTA) 200-200-20 MG/5ML suspension 30 mL  30  mL Oral Q4H PRN [name], DO   30 mL at 09/20/22 1142   diphenhydrAMINE (BENADRYL) capsule 50 mg  50 mg Oral Q6H PRN [name], DO   50 mg at 09/29/22 2254   Or   diphenhydrAMINE (BENADRYL) injection 50 mg  50 mg Intramuscular Q6H PRN [name], DO       haloperidol (HALDOL) tablet 5 mg  5 mg Oral Q6H PRN [name], DO   5 mg at 09/28/22 2149   Or   haloperidol lactate (HALDOL) injection 5 mg  5 mg Intramuscular Q6H PRN [name], DO       ibuprofen (ADVIL) tablet 600 mg  600 mg Oral Q6H PRN [name], [name], MD   600 mg at 12/25/22 2150   LORazepam (ATIVAN) tablet 1 mg  1 mg Oral TID PRN [name], DO   1 mg at 09/27/22 0025   magnesium hydroxide (MILK OF MAGNESIA) suspension 30 mL  30 mL Oral Daily PRN [name], DO   30 mL at 11/08/22 1012   traZODone (DESYREL) tablet 50 mg  50 mg Oral QHS PRN [name], DO   50 mg at 12/25/22 2150    Lab Results: No results found for this or any previous visit (from the past 48 hour(s)).  Blood Alcohol level:  Lab Results  Component Value Date   ETH <10 08/17/2022   ETH <10 01/11/2021      Musculoskeletal: Strength & Muscle Tone: within normal limits Gait & Station: normal Patient leans: N/A   Psychiatric Specialty Exam:   Presentation  General Appearance:  Casual; Neat   Eye Contact: Fair   Speech: Spontaneous   Speech Volume: Normal   Handedness: Right     Mood and Affect  Mood: "Good"   Affect: Stable    Thought Processes: Improved,probably at baseline.    Descriptions of Associations: Intact   Orientation: Well-oriented   Thought Content:Denies SI/HI, at baseline   Hallucinations:Denies ,   Ideas of Reference:None noted   Suicidal Thoughts:Denies SI  Homicidal Thoughts:Denies HI   Sensorium  Memory: Immediate Fair; Remote Poor   Judgment: Fair, pt is Rx compliant   Insight: Improved      Executive Functions  Concentration: Improved   Attention Span: Improved   Language: Fair     Psychomotor Activity  Psychomotor Activity: Normal  Assets  Assets: Communication Skills     Sleep  Sleep:Improved     Physical Exam: Physical Exam Vitals and nursing note reviewed.  Constitutional:      Appearance: Normal appearance. She is normal weight.  Neurological:     General: No focal deficit present.     Mental Status: She is alert.      Review of Systems  Constitutional:  Negative.   HENT: Negative.    Eyes: Negative.   Respiratory: Negative.    Cardiovascular: Negative.   Gastrointestinal: Negative.   Genitourinary: Negative.   Musculoskeletal: Negative.   Skin: Negative.   Neurological: Negative.   Endo/Heme/Allergies: Negative.     Blood pressure 117/67, pulse 68, temperature 98.1 F (36.7 C), resp. rate 18, height 5\' 6"  (1.676 m), weight (P) 82.8 kg, SpO2 98%. Body mass index is 29.46 kg/m (pended).   Treatment Plan Summary: Daily contact with patient to assess and evaluate symptoms and progress in treatment, Medication management, and Plan continue current medications.  Continue to monitor patient on as needed meds   Patient received  dose of Invega Sustenna 156 mg IM on 12/18/2022 Repeat every 4 weeks    [name], MD

## 2022-12-26 NOTE — BHH Counselor (Signed)
CSW contacted Lauralynn Loeb to figure out what times work best for him. He states morning times work best for the meeting.  He states he could do anytime in the morning.    CSW will reach out to leadership to schedule meeting.   Reynaldo Minium, MSW, [place] 12/26/2022 12:24 PM

## 2022-12-26 NOTE — Group Note (Signed)
Recreation Therapy Group Note   Group Topic:Communication  Group Date: 12/26/2022 Start Time: 1400 End Time: 1445 Facilitators: Rosina Lowenstein, LRT, CTRS Location: Courtyard  Group Description: Emotional Check in. Patient sat and talked with LRT about how they are doing and whatever else is on their mind. LRT provided active listening, reassurance and encouragement. Pts were given the opportunity to listen to music or play cornhole while getting fresh air and sunlight in the courtyard.    Goal Area(s) Addressed: Patient will engage in conversation with LRT. Patient will communicate their wants, needs, or questions.  Patient will practice a new coping skill of "talking to someone".    Affect/Mood: Appropriate   Participation Level: Active and Engaged   Participation Quality: Independent   Behavior: Calm and Cooperative   Speech/Thought Process: Coherent   Insight: Fair   Judgement: Good   Modes of Intervention: Open Conversation   Patient Response to Interventions:  Receptive   Education Outcome:  Acknowledges education   Clinical Observations/Individualized Feedback: Betty Henderson was active in their participation of session activities and group discussion. Pt shared that she would like for social work to reach out to her older brother regarding placement. LRT notified social work. Pt shared that her toenails still have not been cut and she would like for them to. Pt was noted to be singing along to the [place] [employer] songs she requested.    Plan: Continue to engage patient in RT group sessions 2-3x/week.   Rosina Lowenstein, LRT, CTRS 12/26/2022 2:59 PM

## 2022-12-26 NOTE — Progress Notes (Signed)
Patient calm and cooperative.  Flat affect. Denies SI/HI and AVH.  Denies pain.  Good appetite.  Reports she slept well.    No scheduled medications.  15 min checks in place for safety.  Patient isolates to room with exception of meals.  Minimal interaction with peers and staff.  Patient did participate in RT group in courtyard.

## 2022-12-26 NOTE — Plan of Care (Signed)

## 2022-12-26 NOTE — Group Note (Signed)
Date:  12/26/2022 Time:  8:25 PM  Group Topic/Focus:  Making Healthy Choices:   The focus of this group is to help patients identify negative/unhealthy choices they were using prior to admission and identify positive/healthier coping strategies to replace them upon discharge.    Participation Level:  Active  Participation Quality:  Appropriate  Affect:  Appropriate  Cognitive:  Appropriate  Insight: Appropriate  Engagement in Group:  Engaged  Modes of Intervention:  Discussion  Additional Comments:    [name] 12/26/2022, 8:25 PM

## 2022-12-26 NOTE — Progress Notes (Signed)
   12/26/22 0603  15 Minute Checks  Location Bedroom  Visual Appearance Calm  Behavior Sleeping  Sleep (Behavioral Health Patients Only)  Calculate sleep? (Click Yes once per 24 hr at 0600 safety check) Yes  Documented sleep last 24 hours 9.25

## 2022-12-27 DIAGNOSIS — F250 Schizoaffective disorder, bipolar type: Secondary | ICD-10-CM | POA: Diagnosis not present

## 2022-12-27 NOTE — Group Note (Signed)
Date:  12/27/2022 Time:  8:16 PM  Group Topic/Focus:  Healthy Communication:   The focus of this group is to discuss communication, barriers to communication, as well as healthy ways to communicate with others.    Participation Level:  Active  Participation Quality:  Appropriate  Affect:  Appropriate  Cognitive:  Alert and Appropriate  Insight: Appropriate and Good  Engagement in Group:  Engaged  Modes of Intervention:  Support  Additional Comments:    Corney Knighton 12/27/2022, 8:16 PM

## 2022-12-27 NOTE — Progress Notes (Signed)
[facility] MD Progress Note  Betty Henderson  MRN:  [id number]  Subjective: Case discussed in multidisciplinary meeting today, chart reviewed, patient seen today during rounds.  Social worker informed that patient has been assigned placement coordinator through DSS.  Social worker also informed that DSS has recommended to have a meeting with patient's family, especially patient's son.  Apparently DSS wants patient's son to take over the guardianship. No behavioral issues reported by the staff.  Patient reports she is doing fine today.  Today patient question about her discharge.  Patient was informed that DSS worker and social worker are working on finding a place for patient.  Patient was provided with support and reassurance.  Patient reports her appetite is good.  Patient denies any thoughts of harming herself or others.  Patient denies auditory or visual hallucinations. Patient is awaiting placement.  Principal Problem: Schizoaffective disorder, bipolar type (HCC) Diagnosis: Principal Problem:   Schizoaffective disorder, bipolar type (HCC)   Past Psychiatric History: Schizoaffective disorder, bipolar type.  Past Medical History:  Past Medical History:  Diagnosis Date   Anemia    Arthritis    Chronic pain    Drug-seeking behavior    Malingering    Osteopetrosis    Psychosis (HCC)    Schizoaffective disorder, bipolar type (HCC)     Past Surgical History:  Procedure Laterality Date   MOUTH SURGERY     TUBAL LIGATION     Family History:  Family History  Family history unknown: Yes   Family Psychiatric  History: Unremarkable Social History:  Social History   Substance and Sexual Activity  Alcohol Use Not Currently   Comment: 1 cocktail 3 weeks ago     Social History   Substance and Sexual Activity  Drug Use Not Currently   Types: Cocaine   Comment: states "it's legal though"    Social History   Socioeconomic History   Marital status: Single    Spouse name: Not on  file   Number of children: Not on file   Years of education: Not on file   Highest education level: Not on file  Occupational History   Not on file  Tobacco Use   Smoking status: Every Day    Current packs/day: 0.50    Types: Cigarettes   Smokeless tobacco: Never  Vaping Use   Vaping status: Never Used  Substance and Sexual Activity   Alcohol use: Not Currently    Comment: 1 cocktail 3 weeks ago   Drug use: Not Currently    Types: Cocaine    Comment: states "it's legal though"   Sexual activity: Never  Other Topics Concern   Not on file  Social History Narrative   Not on file   Social Determinants of Health   Financial Resource Strain: Not on file  Food Insecurity: Food Insecurity Present (08/22/2022)   Hunger Vital Sign    Worried About Running Out of Food in the Last Year: Sometimes true    Ran Out of Food in the Last Year: Sometimes true  Transportation Needs: Patient Unable To Answer (08/22/2022)   PRAPARE - Transportation    Lack of Transportation (Medical): Patient unable to answer    Lack of Transportation (Non-Medical): Patient unable to answer  Recent Concern: Transportation Needs - Unmet Transportation Needs (06/13/2022)   Received from [facility], [facility]   [facility] - Transportation    Lack of Transportation (Medical): Not on file    Lack of Transportation (Non-Medical): Yes  Physical Activity:  Not on file  Stress: Not on file  Social Connections: Unknown (08/06/2021)   Received from [facility], [facility]   Social Network    Social Network: Not on file   Additional Social History:     Patient is homeless at this time.  Social worker is working with DSS on [employer] application and  placement.                    Sleep: Good  Appetite:  Good  Current Medications: Current Facility-Administered Medications  Medication Dose Route Frequency Provider Last Rate Last Admin   acetaminophen (TYLENOL) tablet 650 mg  650 mg Oral Q6H PRN  [name], DO   650 mg at 12/26/22 2051   alum & mag hydroxide-simeth (MAALOX/MYLANTA) 200-200-20 MG/5ML suspension 30 mL  30 mL Oral Q4H PRN [name], DO   30 mL at 09/20/22 1142   diphenhydrAMINE (BENADRYL) capsule 50 mg  50 mg Oral Q6H PRN [name], DO   50 mg at 09/29/22 2254   Or   diphenhydrAMINE (BENADRYL) injection 50 mg  50 mg Intramuscular Q6H PRN [name], DO       haloperidol (HALDOL) tablet 5 mg  5 mg Oral Q6H PRN [name], DO   5 mg at 09/28/22 2149   Or   haloperidol lactate (HALDOL) injection 5 mg  5 mg Intramuscular Q6H PRN [name], DO       ibuprofen (ADVIL) tablet 600 mg  600 mg Oral Q6H PRN [name], [name], MD   600 mg at 12/25/22 2150   LORazepam (ATIVAN) tablet 1 mg  1 mg Oral TID PRN [name], DO   1 mg at 09/27/22 0025   magnesium hydroxide (MILK OF MAGNESIA) suspension 30 mL  30 mL Oral Daily PRN [name], DO   30 mL at 11/08/22 1012   traZODone (DESYREL) tablet 50 mg  50 mg Oral QHS PRN [name], DO   50 mg at 12/26/22 2047    Lab Results: No results found for this or any previous visit (from the past 48 hour(s)).  Blood Alcohol level:  Lab Results  Component Value Date   ETH <10 08/17/2022   ETH <10 01/11/2021      Musculoskeletal: Strength & Muscle Tone: within normal limits Gait & Station: normal Patient leans: N/A   Psychiatric Specialty Exam:   Presentation  General Appearance:  Casual; Neat   Eye Contact: Fair   Speech: Spontaneous   Speech Volume: Normal   Handedness: Right     Mood and Affect  Mood: "Good"   Affect: Stable    Thought Processes: Improved,probably at baseline.    Descriptions of Associations: Intact   Orientation: Well-oriented   Thought Content:Denies SI/HI, at baseline   Hallucinations:Denies ,   Ideas of Reference:None noted   Suicidal Thoughts:Denies  SI  Homicidal Thoughts:Denies HI   Sensorium  Memory: Immediate Fair; Remote Poor   Judgment: Fair, pt is Rx compliant   Insight: Improved     Executive Functions  Concentration: Improved   Attention Span: Improved   Language: Fair     Psychomotor Activity  Psychomotor Activity: Normal  Assets  Assets: Communication Skills     Sleep  Sleep:Improved     Physical Exam: Physical Exam Vitals and nursing note reviewed.  Constitutional:      Appearance: Normal appearance. She is normal weight.  Neurological:     General:  No focal deficit present.     Mental Status: She is alert.      Review of Systems  Constitutional: Negative.   HENT: Negative.    Eyes: Negative.   Respiratory: Negative.    Cardiovascular: Negative.   Gastrointestinal: Negative.   Genitourinary: Negative.   Musculoskeletal: Negative.   Skin: Negative.   Neurological: Negative.   Endo/Heme/Allergies: Negative.     Blood pressure 110/71, pulse 92, temperature 97.8 F (36.6 C), resp. rate 18, height 5\' 6"  (1.676 m), weight (P) 82.8 kg, SpO2 96%. Body mass index is 29.46 kg/m (pended).   Treatment Plan Summary: Daily contact with patient to assess and evaluate symptoms and progress in treatment, Medication management, and Plan continue current medications.  Continue to monitor patient on as needed meds   Patient received  dose of Invega Sustenna 156 mg IM on 12/18/2022 Repeat every 4 weeks    [name], MD

## 2022-12-27 NOTE — Plan of Care (Signed)

## 2022-12-27 NOTE — Group Note (Signed)
Date:  12/27/2022 Time:  11:20 AM  Group Topic/Focus:  Managing Feelings:   The focus of this group is to identify what feelings patients have difficulty handling and develop a plan to handle them in a healthier way upon discharge.    Participation Level:  Active  Participation Quality:  Appropriate  Affect:  Appropriate  Cognitive:  Alert  Insight: Appropriate  Engagement in Group:  Engaged  Modes of Intervention:  Education  Additional Comments:    Ardelle Anton 12/27/2022, 11:20 AM

## 2022-12-27 NOTE — Progress Notes (Signed)
   12/27/22 9147  15 Minute Checks  Location Bedroom  Visual Appearance Calm  Behavior Sleeping  Sleep (Behavioral Health Patients Only)  Calculate sleep? (Click Yes once per 24 hr at 0600 safety check) Yes  Documented sleep last 24 hours 12.25

## 2022-12-27 NOTE — Group Note (Signed)
Recreation Therapy Group Note   Group Topic:Relaxation  Group Date: 12/27/2022 Start Time: 1300 End Time: 1350 Facilitators: Rosina Lowenstein, LRT, CTRS Location: Courtyard  Group Description: Meditation. LRT and patients discussed what they know about meditation and mindfulness. LRT played a Deep Breathing Meditation exercise script for patients to follow along to. LRT and patients discussed how meditation and deep breathing can be used as a coping skill post--discharge. After the meditation, LRT took song requests from pts to listen to while getting fresh air and sunlight.   Goal Area(s) Addressed: Patient will practice using relaxation technique. Patient will identify a new coping skill.  Patient will follow multistep directions to reduce anxiety and stress.   Affect/Mood: Appropriate   Participation Level: Active and Engaged   Participation Quality: Independent   Behavior: Appropriate   Speech/Thought Process: Coherent   Insight: Good   Judgement: Good   Modes of Intervention: Activity   Patient Response to Interventions:  Attentive and Receptive   Education Outcome:  Acknowledges education   Clinical Observations/Individualized Feedback: Betty Henderson was active in their participation of session activities and group discussion. Pt interacted well with LRT and peers duration of session.   Plan: Continue to engage patient in RT group sessions 2-3x/week.   Rosina Lowenstein, LRT, CTRS 12/27/2022 3:03 PM

## 2022-12-27 NOTE — Progress Notes (Signed)
   12/26/22 2300  Psych Admission Type (Psych Patients Only)  Admission Status Voluntary  Psychosocial Assessment  Patient Complaints Other (Comment) ("My thighs hurt.")  Eye Contact Brief  Facial Expression Flat  Affect Flat  Speech Slow  Interaction Isolative;Minimal  Motor Activity Other (Comment) (Lying in bed)  Appearance/Hygiene Unremarkable  Behavior Characteristics Guarded;Cooperative  Mood Pleasant  Thought Process  Coherency WDL  Content WDL  Delusions None reported or observed  Perception WDL  Hallucination None reported or observed  Judgment WDL  Confusion None  Danger to Self  Current suicidal ideation? Denies  Danger to Others  Danger to Others None reported or observed  Danger to Others Abnormal  Harmful Behavior to others No threats or harm toward other people

## 2022-12-27 NOTE — Progress Notes (Signed)
Patient pleasant and cooperative. Flat, sad  affect.  Denies SI/HI and AVH.  Patient expresses to staff that she is ready for discharge.  Denies pain.  No scheduled medications.  15 min checks in place for safety.  Patient isolated to her room for most of the day.  Minimal interaction with peers and staff.

## 2022-12-28 DIAGNOSIS — F250 Schizoaffective disorder, bipolar type: Secondary | ICD-10-CM | POA: Diagnosis not present

## 2022-12-28 NOTE — Consult Note (Signed)
PODIATRY / FOOT AND ANKLE SURGERY CONSULTATION NOTE  Requesting Physician: Dr. Marlou Porch  Reason for consult: Long, possibly infected nails  Chief Complaint: Elongated nails   HPI: Betty Henderson is a 62 y.o. female who presents with complaints of elongated and thickened toenails to both feet.  She notes that they are painful when trying to put on shoes.  She does not note any specific ingrown toenails but notes that have gotten very long since he's been here.  Patient has been admitted to the psych ward now for about 4 months and is awaiting placement.  PMHx:  Past Medical History:  Diagnosis Date   Anemia    Arthritis    Chronic pain    Drug-seeking behavior    Malingering    Osteopetrosis    Psychosis (HCC)    Schizoaffective disorder, bipolar type (HCC)     Surgical Hx:  Past Surgical History:  Procedure Laterality Date   MOUTH SURGERY     TUBAL LIGATION      FHx:  Family History  Family history unknown: Yes    Social History:  reports that she has been smoking cigarettes. She has never used smokeless tobacco. She reports that she does not currently use alcohol. She reports that she does not currently use drugs after having used the following drugs: Cocaine.  Allergies:  Allergies  Allergen Reactions   Other Itching    PT reports having environmental allergies which she used to receive shots for, but cannot specify exact allergens   Lemon Oil Rash   Proanthocyanidin Rash   Strawberry Extract Rash   Adhesive  [Tape] Rash   Cherry Rash   Wound Dressing Adhesive Rash   Medications Prior to Admission  Medication Sig Dispense Refill   ondansetron (ZOFRAN) 4 MG tablet Take 1 tablet (4 mg total) by mouth every 8 (eight) hours as needed for nausea or vomiting. (Patient not taking: Reported on 08/17/2022) 20 tablet 0    Physical Exam: General: Alert and oriented.  No apparent distress.  Vascular: DP/PT pulses intact bilaterally, no hair growth noted to bilateral  lower extremities, mild nonpitting bilateral lower extremity edema, both feet appear to be warm to touch.  Neuro: Light touch sensation intact to digits bilaterally.  Derm: Nails x 10 appear to be thickened, elongated, dystrophic and brittle subungual debris with mild pain on palpation, no ingrown toenails present, no erythema or edema, no drainage, no odor, no signs of infection.  Skin is somewhat thin and atrophic to both feet.  MSK: Mild pain on palpation all toenails  No results found for this or any previous visit (from the past 48 hour(s)). No results found.  Blood pressure 105/68, pulse 78, temperature (!) 97.3 F (36.3 C), resp. rate 17, height 5\' 6"  (1.676 m), weight (P) 82.8 kg, SpO2 100%.  Assessment Onychomycosis Painful toenails  Plan -Patient seen and examined -Discussed onychomycotic nails.  Discussed treatment options. -Nail debridement formed x 10 without incident with sterile nail nipper, patient tolerated procedure well.  Podiatry team to sign off on patient at this time.  No follow-up needed.  Rosetta Posner, DPM 12/28/2022, 8:52 AM

## 2022-12-28 NOTE — Progress Notes (Signed)
[facility] MD Progress Note  Betty Henderson  MRN:  [id number]  Subjective: Case discussed in multidisciplinary meeting today, chart reviewed, patient seen today during rounds.  Social worker informed that patient has been assigned placement coordinator through DSS.  Social worker also informed that DSS has recommended to have a meeting with patient's family, especially patient's son.  Apparently DSS wants patient's son to take over the guardianship. No behavioral issues reported by the staff.  Podiatry was consulted yesterday.  Per Dr. Excell Seltzer ; "Nail debridement formed x 10 without incident with sterile nail nipper, patient tolerated procedure well."  Patient reports she is doing fine today.  Patient is happy to get her toenails taken care of.  Today is focused on her discharge.  Patient once again was informed that DSS worker and social worker are working on finding a place for patient.  Patient was provided with support and reassurance.  Patient reports her appetite is good.  Patient denies any thoughts of harming herself or others.  Patient denies auditory or visual hallucinations. Patient is awaiting placement.  Principal Problem: Schizoaffective disorder, bipolar type (HCC) Diagnosis: Principal Problem:   Schizoaffective disorder, bipolar type (HCC)   Past Psychiatric History: Schizoaffective disorder, bipolar type.  Past Medical History:  Past Medical History:  Diagnosis Date   Anemia    Arthritis    Chronic pain    Drug-seeking behavior    Malingering    Osteopetrosis    Psychosis (HCC)    Schizoaffective disorder, bipolar type (HCC)     Past Surgical History:  Procedure Laterality Date   MOUTH SURGERY     TUBAL LIGATION     Family History:  Family History  Family history unknown: Yes   Family Psychiatric  History: Unremarkable Social History:  Social History   Substance and Sexual Activity  Alcohol Use Not Currently   Comment: 1 cocktail 3 weeks ago     Social History    Substance and Sexual Activity  Drug Use Not Currently   Types: Cocaine   Comment: states "it's legal though"    Social History   Socioeconomic History   Marital status: Single    Spouse name: Not on file   Number of children: Not on file   Years of education: Not on file   Highest education level: Not on file  Occupational History   Not on file  Tobacco Use   Smoking status: Every Day    Current packs/day: 0.50    Types: Cigarettes   Smokeless tobacco: Never  Vaping Use   Vaping status: Never Used  Substance and Sexual Activity   Alcohol use: Not Currently    Comment: 1 cocktail 3 weeks ago   Drug use: Not Currently    Types: Cocaine    Comment: states "it's legal though"   Sexual activity: Never  Other Topics Concern   Not on file  Social History Narrative   Not on file   Social Determinants of Health   Financial Resource Strain: Not on file  Food Insecurity: Food Insecurity Present (08/22/2022)   Hunger Vital Sign    Worried About Running Out of Food in the Last Year: Sometimes true    Ran Out of Food in the Last Year: Sometimes true  Transportation Needs: Patient Unable To Answer (08/22/2022)   PRAPARE - Transportation    Lack of Transportation (Medical): Patient unable to answer    Lack of Transportation (Non-Medical): Patient unable to answer  Recent Concern: Transportation Needs -  Unmet Transportation Needs (06/13/2022)   Received from [facility], [facility]   [facility] - Transportation    Lack of Transportation (Medical): Not on file    Lack of Transportation (Non-Medical): Yes  Physical Activity: Not on file  Stress: Not on file  Social Connections: Unknown (08/06/2021)   Received from [facility], [facility]   Social Network    Social Network: Not on file   Additional Social History:     Patient is homeless at this time.  Social worker is working with DSS on [employer] application and  placement.                    Sleep:  Good  Appetite:  Good  Current Medications: Current Facility-Administered Medications  Medication Dose Route Frequency Provider Last Rate Last Admin   acetaminophen (TYLENOL) tablet 650 mg  650 mg Oral Q6H PRN [name], DO   650 mg at 12/26/22 2051   alum & mag hydroxide-simeth (MAALOX/MYLANTA) 200-200-20 MG/5ML suspension 30 mL  30 mL Oral Q4H PRN [name], DO   30 mL at 09/20/22 1142   diphenhydrAMINE (BENADRYL) capsule 50 mg  50 mg Oral Q6H PRN [name], DO   50 mg at 09/29/22 2254   Or   diphenhydrAMINE (BENADRYL) injection 50 mg  50 mg Intramuscular Q6H PRN [name], DO       haloperidol (HALDOL) tablet 5 mg  5 mg Oral Q6H PRN [name], DO   5 mg at 09/28/22 2149   Or   haloperidol lactate (HALDOL) injection 5 mg  5 mg Intramuscular Q6H PRN [name], DO       ibuprofen (ADVIL) tablet 600 mg  600 mg Oral Q6H PRN [name], [name], MD   600 mg at 12/25/22 2150   LORazepam (ATIVAN) tablet 1 mg  1 mg Oral TID PRN [name], DO   1 mg at 09/27/22 0025   magnesium hydroxide (MILK OF MAGNESIA) suspension 30 mL  30 mL Oral Daily PRN [name], DO   30 mL at 11/08/22 1012   traZODone (DESYREL) tablet 50 mg  50 mg Oral QHS PRN [name], DO   50 mg at 12/27/22 2057    Lab Results: No results found for this or any previous visit (from the past 48 hour(s)).  Blood Alcohol level:  Lab Results  Component Value Date   ETH <10 08/17/2022   ETH <10 01/11/2021      Musculoskeletal: Strength & Muscle Tone: within normal limits Gait & Station: normal Patient leans: N/A   Psychiatric Specialty Exam:   Presentation  General Appearance:  Casual; Neat   Eye Contact: Fair   Speech: Spontaneous   Speech Volume: Normal   Handedness: Right     Mood and Affect  Mood: "Good"   Affect: Stable    Thought Processes: Improved,probably at baseline.     Descriptions of Associations: Intact   Orientation: Well-oriented   Thought Content:Denies SI/HI, at baseline   Hallucinations:Denies ,   Ideas of Reference:None noted   Suicidal Thoughts:Denies SI  Homicidal Thoughts:Denies HI   Sensorium  Memory: Immediate Fair; Remote Poor   Judgment: Fair, pt is Rx compliant   Insight: Improved     Executive Functions  Concentration: Improved   Attention Span: Improved   Language: Fair     Psychomotor Activity  Psychomotor Activity: Normal  Assets  Assets: Communication Skills  Sleep  Sleep:Improved     Physical Exam: Physical Exam Vitals and nursing note reviewed.  Constitutional:      Appearance: Normal appearance. She is normal weight.  Neurological:     General: No focal deficit present.     Mental Status: She is alert.      Review of Systems  Constitutional: Negative.   HENT: Negative.    Eyes: Negative.   Respiratory: Negative.    Cardiovascular: Negative.   Gastrointestinal: Negative.   Genitourinary: Negative.   Musculoskeletal: Negative.   Skin: Negative.   Neurological: Negative.   Endo/Heme/Allergies: Negative.     Blood pressure 105/68, pulse 78, temperature (!) 97.3 F (36.3 C), resp. rate 17, height 5\' 6"  (1.676 m), weight (P) 82.8 kg, SpO2 100%. Body mass index is 29.46 kg/m (pended).   Treatment Plan Summary: Daily contact with patient to assess and evaluate symptoms and progress in treatment, Medication management, and Plan continue current medications.  Continue to monitor patient on as needed meds   Patient received  dose of Invega Sustenna 156 mg IM on 12/18/2022 Repeat every 4 weeks Podiatrist help managing patient's toe nails is appreciated    [name], MD

## 2022-12-28 NOTE — Progress Notes (Signed)
Patient is voluntary admission to [facility] for SAD - Bipolar. Patient is awaiting placement.  Today she had her toenails trimmed by podiatry. She is calm, cooperative, friendly with staff and peers.  Does tend to isolate to room, but is talkative and makes her needs known. She denies SI, HI, AVH, Anxiety and Depression. Completes her own ADL's and only needs to have items supplied to her for showers. Uses a depends for urinary incontinence but is continent of bowels.  Will continue to monitor.

## 2022-12-28 NOTE — Progress Notes (Signed)
   12/27/22 2200  Psych Admission Type (Psych Patients Only)  Admission Status Voluntary  Psychosocial Assessment  Patient Complaints None  Eye Contact Brief  Facial Expression Flat  Affect Flat  Speech Soft  Interaction Guarded;Minimal  Motor Activity Slow;Shuffling  Appearance/Hygiene In scrubs  Behavior Characteristics Cooperative;Appropriate to situation  Mood Pleasant  Thought Process  Coherency WDL  Content WDL  Delusions None reported or observed  Perception WDL  Hallucination None reported or observed  Judgment WDL  Confusion None  Danger to Self  Current suicidal ideation? Denies  Agreement Not to Harm Self Yes  Description of Agreement Pt. contracts for safety if intrusive thoughts to harm self occurs.  Danger to Others  Danger to Others None reported or observed

## 2022-12-29 DIAGNOSIS — F250 Schizoaffective disorder, bipolar type: Secondary | ICD-10-CM | POA: Diagnosis not present

## 2022-12-29 NOTE — Progress Notes (Signed)
   12/29/22 0200  Psych Admission Type (Psych Patients Only)  Admission Status Voluntary  Psychosocial Assessment  Patient Complaints None  Eye Contact Brief  Facial Expression Flat  Affect Flat  Speech Soft  Interaction Minimal  Motor Activity Slow  Appearance/Hygiene In scrubs  Behavior Characteristics Cooperative  Mood Pleasant  Thought Process  Coherency WDL  Content WDL  Delusions None reported or observed  Perception WDL  Hallucination None reported or observed  Judgment WDL  Confusion None  Danger to Self  Current suicidal ideation? Denies  Agreement Not to Harm Self Yes  Description of Agreement verbal  Danger to Others  Danger to Others None reported or observed  Danger to Others Abnormal  Harmful Behavior to others No threats or harm toward other people  Destructive Behavior No threats or harm toward property

## 2022-12-29 NOTE — Progress Notes (Signed)
[facility] MD Progress Note  Betty Henderson  MRN:  [id number]  Subjective: Case discussed in multidisciplinary meeting today, chart reviewed, patient seen today during rounds.  Social worker informed that patient has been assigned placement coordinator through DSS.  Social worker also informed that DSS has recommended to have a meeting with patient's family, especially patient's son.  Apparently DSS wants patient's son to take over the guardianship. No behavioral issues reported by the staff.  Patient reports she is doing fine today.  Patient continues to question about her discharge.   Patient once again was informed that DSS worker and social worker are working on finding a place for patient.  Patient reports she will call her son to discuss housing.  Patient was provided with support and reassurance.  Patient reports her appetite is good.  Patient denies any thoughts of harming herself or others.  Patient denies auditory or visual hallucinations. Patient is awaiting placement.  Principal Problem: Schizoaffective disorder, bipolar type (HCC) Diagnosis: Principal Problem:   Schizoaffective disorder, bipolar type (HCC)   Past Psychiatric History: Schizoaffective disorder, bipolar type.  Past Medical History:  Past Medical History:  Diagnosis Date   Anemia    Arthritis    Chronic pain    Drug-seeking behavior    Malingering    Osteopetrosis    Psychosis (HCC)    Schizoaffective disorder, bipolar type (HCC)     Past Surgical History:  Procedure Laterality Date   MOUTH SURGERY     TUBAL LIGATION     Family History:  Family History  Family history unknown: Yes   Family Psychiatric  History: Unremarkable Social History:  Social History   Substance and Sexual Activity  Alcohol Use Not Currently   Comment: 1 cocktail 3 weeks ago     Social History   Substance and Sexual Activity  Drug Use Not Currently   Types: Cocaine   Comment: states "it's legal though"    Social History    Socioeconomic History   Marital status: Single    Spouse name: Not on file   Number of children: Not on file   Years of education: Not on file   Highest education level: Not on file  Occupational History   Not on file  Tobacco Use   Smoking status: Every Day    Current packs/day: 0.50    Types: Cigarettes   Smokeless tobacco: Never  Vaping Use   Vaping status: Never Used  Substance and Sexual Activity   Alcohol use: Not Currently    Comment: 1 cocktail 3 weeks ago   Drug use: Not Currently    Types: Cocaine    Comment: states "it's legal though"   Sexual activity: Never  Other Topics Concern   Not on file  Social History Narrative   Not on file   Social Determinants of Health   Financial Resource Strain: Not on file  Food Insecurity: Food Insecurity Present (08/22/2022)   Hunger Vital Sign    Worried About Running Out of Food in the Last Year: Sometimes true    Ran Out of Food in the Last Year: Sometimes true  Transportation Needs: Patient Unable To Answer (08/22/2022)   PRAPARE - Transportation    Lack of Transportation (Medical): Patient unable to answer    Lack of Transportation (Non-Medical): Patient unable to answer  Recent Concern: Transportation Needs - Unmet Transportation Needs (06/13/2022)   Received from [facility], [facility]   [facility] - Transportation    Lack of Transportation (  Medical): Not on file    Lack of Transportation (Non-Medical): Yes  Physical Activity: Not on file  Stress: Not on file  Social Connections: Unknown (08/06/2021)   Received from [facility], [facility]   Social Network    Social Network: Not on file   Additional Social History:     Patient is homeless at this time.  Social worker is working with DSS on [employer] application and  placement.                    Sleep: Good  Appetite:  Good  Current Medications: Current Facility-Administered Medications  Medication Dose Route Frequency Provider Last Rate  Last Admin   acetaminophen (TYLENOL) tablet 650 mg  650 mg Oral Q6H PRN [name], DO   650 mg at 12/28/22 2138   alum & mag hydroxide-simeth (MAALOX/MYLANTA) 200-200-20 MG/5ML suspension 30 mL  30 mL Oral Q4H PRN [name], DO   30 mL at 09/20/22 1142   diphenhydrAMINE (BENADRYL) capsule 50 mg  50 mg Oral Q6H PRN [name], DO   50 mg at 09/29/22 2254   Or   diphenhydrAMINE (BENADRYL) injection 50 mg  50 mg Intramuscular Q6H PRN [name], DO       haloperidol (HALDOL) tablet 5 mg  5 mg Oral Q6H PRN [name], DO   5 mg at 09/28/22 2149   Or   haloperidol lactate (HALDOL) injection 5 mg  5 mg Intramuscular Q6H PRN [name], DO       ibuprofen (ADVIL) tablet 600 mg  600 mg Oral Q6H PRN [name], [name], MD   600 mg at 12/25/22 2150   LORazepam (ATIVAN) tablet 1 mg  1 mg Oral TID PRN [name], DO   1 mg at 09/27/22 0025   magnesium hydroxide (MILK OF MAGNESIA) suspension 30 mL  30 mL Oral Daily PRN [name], DO   30 mL at 11/08/22 1012   traZODone (DESYREL) tablet 50 mg  50 mg Oral QHS PRN [name], DO   50 mg at 12/28/22 2125    Lab Results: No results found for this or any previous visit (from the past 48 hour(s)).  Blood Alcohol level:  Lab Results  Component Value Date   ETH <10 08/17/2022   ETH <10 01/11/2021      Musculoskeletal: Strength & Muscle Tone: within normal limits Gait & Station: normal Patient leans: N/A   Psychiatric Specialty Exam:   Presentation  General Appearance:  Casual; Neat   Eye Contact: Fair   Speech: Spontaneous   Speech Volume: Normal   Handedness: Right     Mood and Affect  Mood: "Good"   Affect: Stable    Thought Processes: Improved,probably at baseline.    Descriptions of Associations: Intact   Orientation: Well-oriented   Thought Content:Denies SI/HI, at baseline   Hallucinations:Denies ,    Ideas of Reference:None noted   Suicidal Thoughts:Denies SI  Homicidal Thoughts:Denies HI   Sensorium  Memory: Immediate Fair; Remote Poor   Judgment: Fair, pt is Rx compliant   Insight: Improved     Executive Functions  Concentration: Improved   Attention Span: Improved   Language: Fair     Psychomotor Activity  Psychomotor Activity: Normal  Assets  Assets: Communication Skills     Sleep  Sleep:Improved     Physical Exam: Physical Exam Vitals and nursing note reviewed.  Constitutional:  Appearance: Normal appearance. She is normal weight.  Neurological:     General: No focal deficit present.     Mental Status: She is alert.      Review of Systems  Constitutional: Negative.   HENT: Negative.    Eyes: Negative.   Respiratory: Negative.    Cardiovascular: Negative.   Gastrointestinal: Negative.   Genitourinary: Negative.   Musculoskeletal: Negative.   Skin: Negative.   Neurological: Negative.   Endo/Heme/Allergies: Negative.     Blood pressure (!) 142/84, pulse 87, temperature 98.1 F (36.7 C), resp. rate 17, height 5\' 6"  (1.676 m), weight (P) 82.8 kg, SpO2 100%. Body mass index is 29.46 kg/m (pended).   Treatment Plan Summary: Daily contact with patient to assess and evaluate symptoms and progress in treatment, Medication management, and Plan continue current medications.  Continue to monitor patient on as needed meds   Patient received  dose of Invega Sustenna 156 mg IM on 12/18/2022 Repeat every 4 weeks     [name], MD

## 2022-12-29 NOTE — Group Note (Signed)
Date:  12/29/2022 Time:  10:30 PM  Group Topic/Focus:  Making Healthy Choices:   The focus of this group is to help patients identify negative/unhealthy choices they were using prior to admission and identify positive/healthier coping strategies to replace them upon discharge.    Participation Level:  Did Not Attend  Participation Quality:      Affect:      Cognitive:      Insight: None  Engagement in Group:  None  Modes of Intervention:      Additional Comments:    [name] 12/29/2022, 10:30 PM

## 2022-12-29 NOTE — Group Note (Signed)
Date:  12/29/2022 Time:  10:12 AM  Group Topic/Focus:  Wellness Toolbox:   The focus of this group is to discuss various aspects of wellness, balancing those aspects and exploring ways to increase the ability to experience wellness.  Patients will create a wellness toolbox for use upon discharge.    Participation Level:  Active  Participation Quality:  Appropriate  Affect:  Appropriate  Cognitive:  Appropriate  Insight: Appropriate  Engagement in Group:  Engaged  Modes of Intervention:  Activity  Additional Comments:    [name] 12/29/2022, 10:12 AM

## 2022-12-29 NOTE — Group Note (Signed)
LCSW Group Therapy Note  Group Date: 12/29/2022 Start Time: 1300 End Time: 1330   Type of Therapy and Topic:  Group Therapy - Coping Skills  Participation Level:  Active   Description of Group The focus of this group was to determine what unhealthy coping techniques typically are used by group members and what healthy coping techniques would be helpful in coping with various problems. Patients were guided in becoming aware of the differences between healthy and unhealthy coping techniques. Patients were asked to identify 2-3 healthy coping skills they would like to learn to use more effectively.  Therapeutic Goals Patients learned that coping is what human beings do all day long to deal with various situations in their lives Patients defined and discussed healthy vs unhealthy coping techniques Patients identified their preferred coping techniques and identified whether these were healthy or unhealthy Patients determined 2-3 healthy coping skills they would like to become more familiar with and use more often. Patients provided support and ideas to each other   Summary of Patient Progress:  The patient attended group.  Patient proved open to input from peers and feedback from [facility]. Patient demonstrated  insight into the subject matter, was respectful of peers, and participated throughout the entire session. The patient participated in the icebreaker. The patient stated that she usng exercising as a coping skill.      Marshell Levan, LCSWA 12/29/2022  2:27 PM

## 2022-12-29 NOTE — Progress Notes (Signed)
   12/29/22 1950  Psych Admission Type (Psych Patients Only)  Admission Status Voluntary  Psychosocial Assessment  Patient Complaints None  Eye Contact Fair  Facial Expression Flat  Affect Flat  Speech Soft  Interaction Minimal  Motor Activity Other (Comment) (wnl)  Appearance/Hygiene Unremarkable  Behavior Characteristics Cooperative  Mood Pleasant  Thought Process  Coherency WDL  Content WDL  Delusions None reported or observed  Perception WDL  Hallucination None reported or observed  Judgment WDL  Confusion None  Danger to Self  Current suicidal ideation? Denies  Agreement Not to Harm Self Yes  Description of Agreement verbal  Danger to Others  Danger to Others None reported or observed   Progress note   D: Pt seen in her room. Pt denies SI, HI, AVH. Pt rates pain  0/10. Pt rates anxiety  0/10 and depression  0/10. Pt says she is still waiting for placement after discharge. "I'm holding on. It's ok being here. At least I'm not outside." Pt said they went outside today and that was good. Reports good sleep and appetite. No other concerns noted at this time.  A: Pt provided support and encouragement. Pt given scheduled medication as prescribed. PRNs as appropriate. Q15 min checks for safety.   R: Pt safe on the unit. Will continue to monitor.

## 2022-12-29 NOTE — Group Note (Signed)
Date:  12/29/2022 Time:  6:38 AM  Group Topic/Focus:  Identifying Needs:   The focus of this group is to help patients identify their personal needs that have been historically problematic and identify healthy behaviors to address their needs.    Participation Level:  Active  Participation Quality:  Appropriate  Affect:  Appropriate  Cognitive:  Appropriate  Insight: Good  Engagement in Group:  Engaged  Modes of Intervention:  Discussion  Additional Comments:    [name] 12/29/2022, 6:38 AM

## 2022-12-29 NOTE — Group Note (Signed)
Date:  12/29/2022 Time:  2:42 PM  Group Topic/Focus:  Wellness Toolbox:   The focus of this group is to discuss various aspects of wellness, balancing those aspects and exploring ways to increase the ability to experience wellness.  Patients will create a wellness toolbox for use upon discharge.    Participation Level:  Did Not Attend  Participation Quality:    Affect:    Cognitive:    Insight:   Engagement in Group:    Modes of Intervention:    Additional Comments:    Marnita Poirier 12/29/2022, 2:42 PM

## 2022-12-29 NOTE — Progress Notes (Signed)
Patient is a voluntary admission to [facility] for SAD - Bipolar awaiting placement.  Patient continues to be calm, cooperative, and pleasant with staff and peers.  Joins in group activities and is able to perform her own ADL's.  Denies SI, HI, AVH, anxiety and depression.  Will continue to monitor.

## 2022-12-29 NOTE — BH IP Treatment Plan (Signed)
Interdisciplinary Treatment and Diagnostic Plan Update  12/29/2022 Time of Session: 10:44 am Betty Henderson MRN: [id number]  Principal Diagnosis: Schizoaffective disorder, bipolar type [facility])  Secondary Diagnoses: Principal Problem:   Schizoaffective disorder, bipolar type (HCC)   Current Medications:  Current Facility-Administered Medications  Medication Dose Route Frequency Provider Last Rate Last Admin   acetaminophen (TYLENOL) tablet 650 mg  650 mg Oral Q6H PRN [name], DO   650 mg at 12/28/22 2138   alum & mag hydroxide-simeth (MAALOX/MYLANTA) 200-200-20 MG/5ML suspension 30 mL  30 mL Oral Q4H PRN [name], DO   30 mL at 09/20/22 1142   diphenhydrAMINE (BENADRYL) capsule 50 mg  50 mg Oral Q6H PRN [name], DO   50 mg at 09/29/22 2254   Or   diphenhydrAMINE (BENADRYL) injection 50 mg  50 mg Intramuscular Q6H PRN [name], DO       haloperidol (HALDOL) tablet 5 mg  5 mg Oral Q6H PRN [name], DO   5 mg at 09/28/22 2149   Or   haloperidol lactate (HALDOL) injection 5 mg  5 mg Intramuscular Q6H PRN [name], DO       ibuprofen (ADVIL) tablet 600 mg  600 mg Oral Q6H PRN [name], [name], MD   600 mg at 12/25/22 2150   LORazepam (ATIVAN) tablet 1 mg  1 mg Oral TID PRN [name], DO   1 mg at 09/27/22 0025   magnesium hydroxide (MILK OF MAGNESIA) suspension 30 mL  30 mL Oral Daily PRN [name], DO   30 mL at 11/08/22 1012   traZODone (DESYREL) tablet 50 mg  50 mg Oral QHS PRN [name], DO   50 mg at 12/28/22 2125   PTA Medications: Medications Prior to Admission  Medication Sig Dispense Refill Last Dose   ondansetron (ZOFRAN) 4 MG tablet Take 1 tablet (4 mg total) by mouth every 8 (eight) hours as needed for nausea or vomiting. (Patient not taking: Reported on 08/17/2022) 20 tablet 0     Patient Stressors: Financial difficulties   Health problems    Medication change or noncompliance    Patient Strengths: Ability for insight   Treatment Modalities: Medication Management, Group therapy, Case management,  1 to 1 session with clinician, Psychoeducation, Recreational therapy.   Physician Treatment Plan for Primary Diagnosis: Schizoaffective disorder, bipolar type (HCC) Long Term Goal(s): Improvement in symptoms so as ready for discharge   Short Term Goals: Ability to identify changes in lifestyle to reduce recurrence of condition will improve Ability to verbalize feelings will improve Ability to disclose and discuss suicidal ideas Ability to demonstrate self-control will improve Ability to identify and develop effective coping behaviors will improve Ability to maintain clinical measurements within normal limits will improve Compliance with prescribed medications will improve Ability to identify triggers associated with substance abuse/mental health issues will improve  Medication Management: Evaluate patient's response, side effects, and tolerance of medication regimen.  Therapeutic Interventions: 1 to 1 sessions, Unit Group sessions and Medication administration.  Evaluation of Outcomes: Progressing  Physician Treatment Plan for Secondary Diagnosis: Principal Problem:   Schizoaffective disorder, bipolar type (HCC)  Long Term Goal(s): Improvement in symptoms so as ready for discharge   Short Term Goals: Ability to identify changes in lifestyle to reduce recurrence of condition will improve Ability to verbalize feelings will improve Ability to disclose and discuss suicidal ideas Ability to demonstrate self-control will improve Ability to identify and  develop effective coping behaviors will improve Ability to maintain clinical measurements within normal limits will improve Compliance with prescribed medications will improve Ability to identify triggers associated with substance abuse/mental health issues will improve      Medication Management: Evaluate patient's response, side effects, and tolerance of medication regimen.  Therapeutic Interventions: 1 to 1 sessions, Unit Group sessions and Medication administration.  Evaluation of Outcomes: Progressing   RN Treatment Plan for Primary Diagnosis: Schizoaffective disorder, bipolar type (HCC) Long Term Goal(s): Knowledge of disease and therapeutic regimen to maintain health will improve  Short Term Goals: Ability to remain free from injury will improve, Ability to verbalize frustration and anger appropriately will improve, Ability to demonstrate self-control, Ability to participate in decision making will improve, Ability to verbalize feelings will improve, Ability to disclose and discuss suicidal ideas, Ability to identify and develop effective coping behaviors will improve, and Compliance with prescribed medications will improve  Medication Management: RN will administer medications as ordered by provider, will assess and evaluate patient's response and provide education to patient for prescribed medication. RN will report any adverse and/or side effects to prescribing provider.  Therapeutic Interventions: 1 on 1 counseling sessions, Psychoeducation, Medication administration, Evaluate responses to treatment, Monitor vital signs and CBGs as ordered, Perform/monitor CIWA, COWS, AIMS and Fall Risk screenings as ordered, Perform wound care treatments as ordered.  Evaluation of Outcomes: Progressing   LCSW Treatment Plan for Primary Diagnosis: Schizoaffective disorder, bipolar type (HCC) Long Term Goal(s): Safe transition to appropriate next level of care at discharge, Engage patient in therapeutic group addressing interpersonal concerns.  Short Term Goals: Engage patient in aftercare planning with referrals and resources, Increase social support, Increase ability to appropriately verbalize feelings, Increase emotional regulation, Facilitate acceptance of mental  health diagnosis and concerns, and Identify triggers associated with mental health/substance abuse issues  Therapeutic Interventions: Assess for all discharge needs, 1 to 1 time with Social worker, Explore available resources and support systems, Assess for adequacy in community support network, Educate family and significant other(s) on suicide prevention, Complete Psychosocial Assessment, Interpersonal group therapy.  Evaluation of Outcomes: Progressing   Progress in Treatment: Attending groups: Yes. Participating in groups: Yes. Taking medication as prescribed: Yes. Toleration medication: Yes. Family/Significant other contact made: Yes, individual(s) contacted:  Karren Burly, son Patient understands diagnosis: Yes. Discussing patient identified problems/goals with staff: Yes. Medical problems stabilized or resolved: Yes. Denies suicidal/homicidal ideation: Yes. Issues/concerns per patient self-inventory: No. Other:   New problem(s) identified: No, Describe:  none Updates 12/12/22: No changes made at this time Updates 12/17/22: No changes made at this time Update 12/24/22 No changes at this time. Update 12/29/2022: No changes at this time.       New Short Term/Long Term Goal(s): Update 6/30: none at this time. Update 09/27/2022:  No changes at this time.  Update 10/02/2022:  No changes at this time. Update 10/07/22: No changes at this time 10/12/22: No changes at this time Update 10/18/22: No changes at this time Update 10/23/22: No changes at this time Update 10/28/22: No changes at this time Update 11/07/22: No changes at this time Update 11/12/22: No changes at this time  Update 11/17/22: None at this time. Update 11/22/22: None at this time. Update 11/27/22 No changes at this time. Update 12/02/22 No changes at this time  Update 12/07/22: None at this time. Updates 12/12/22: No changes made at this time Updates 12/17/22: No changes made at this time Update 12/24/22 No changes at this time. Update  12/29/2022: No changes at this time.       Patient Goals:  Update 6/30: none at this time. Update 09/27/2022:  No changes at this time. Update 10/02/2022:  No changes at this time. Update 10/07/22: No changes at this time 10/12/22: No changes at this time Update 10/18/22: No changes at this time Update 10/23/22: No changes at this time Update 10/28/22: No changes at this time Update 11/07/22: No changes at this time Update 11/12/22: No changes at this time Update 11/17/22: None at this time. Update 11/22/22: None at this time. Update 11/27/22 No changes at this time Update 12/02/22 No changes at this time   Update 12/07/22: None at this time. Updates 12/12/22: No changes made at this time Updates 12/17/22: No changes made at this time Update 12/24/22 No changes at this time. Update 12/29/2022: No changes at this time.     Discharge Plan or Barriers: Update 6/30: APS report has been made and patient is being investigated for guardianship needs.  Remains homeless with limited supports. Update 09/27/2022:  No changes at this time.  Update 10/02/2022:  Patient remains safe on the unit at this time.  Patient remains psychotic at this time.  APS report has been made, however, no follow up from the caseworker on her case.  No safe discharge identified.  CSW has requested that application for Medicaid and disability be completed.   Update 10/07/22: No changes at this time 10/12/22: No changes at this time Update 10/18/22: No changes at this time Update 10/23/22: No changes at this time Update 10/23/22: No changes at this time Update 10/28/22: According to [facility], pt's caseworker at DSS, the petition was filed for guardianship on 10/21/22, now awaiting for the petition to be approvedUpdate 11/07/22: No changes at this time Update 11/12/22: CSW sent over patients information to [facility] and [facility], awaiting a response. CSW awaiting word from DSS regarding pt's petition and what agency will become her guardian. Update 11/17/22: No changes  at this time. Update 11/22/22: CSW has sent patients information to multiple facilities that were suggested by leadership, pt has been denied from the facility or the facility does not respond. CSW continues to send pt's information to nursing homes. Update 11/27/22 CSW contacted DSS to inquire about the status of guardianship. No response at this time Update 12/02/22 Supervisor Loraine Leriche contacted DSS who stated they are not taking pt on for guardianship but that Quincy Carnes will be present tomorrow to do a visit with social worker and pt and that pt's son will be signing documentation for placement for the pt. Update 12/07/22: [facility] DSS has declined to take guardianship. Son, Casimiro Needle is to step into a more present role in deciding placement. A conversation is still needed. Updates 12/12/22: CSW staffed case with leadership and they state that a family meeting must happen with pt's son. CSW contacted pt's financial navigator and her DSS worker so that they may be present on the call and are able to speak to the efforts they have made to secure pt funding and housing. CSW waiting on responses from both, leadership is aware. Updates 12/17/22: CSW awaiting updates from both [facility] DSS and [facility] financial navigators as to status of pt's applications for Medicaid/[employer] and Disability, respectively. Update 12/24/22 CSW sent email regarding family meeting per request of supervisors. CSW awaiting responses from all parties to schedule family meeting. Update: 12/29/2022 No changes at this time.   Reason for Continuation of Hospitalization: Hallucinations  Mania Medication stabilization   Estimated Length of Stay:  Update 6/30: 1-7 days Update 09/27/2022:  TBD Update 10/02/2022:  No changes at this time. UpdaUpdate 10/18/22: No changes at this time te 10/07/22: No changes at this time Update 10/18/22: No changes at this time Update 10/23/22: No changes at this time Update 10/28/22: No changes at this  time Update 11/07/22: No changes at this time Update 11/12/22: No changes at this time  Update 11/17/22: No changes at this time. Update 11/22/22: None at this time. Update 11/27/22 No changes at this time. Update 12/02/22 No changes at this time  Update 12/07/22: None at this time. Updates 12/12/22: No changes made at this time Updates 12/17/22: TBD. Update 12/29/2022: No changes at this time.  Last 3 [place] Suicide Severity Risk Score: Flowsheet Row Admission (Current) from 08/22/2022 in [facility] BEHAVIORAL MEDICINE ED from 08/17/2022 in [facility] Emergency Department at [facility] ED from 08/12/2022 in [facility] Emergency Department at [facility]  C-SSRS RISK CATEGORY Low Risk No Risk No Risk       Last [facility] 2/9 Scores:     No data to display          Scribe for Treatment Team: Rhett Bannister 12/29/2022 10:44 AM

## 2022-12-29 NOTE — Progress Notes (Signed)
   12/29/22 0600  15 Minute Checks  Location Bedroom  Visual Appearance Calm  Behavior Sleeping  Sleep (Behavioral Health Patients Only)  Calculate sleep? (Click Yes once per 24 hr at 0600 safety check) Yes  Documented sleep last 24 hours 12

## 2022-12-30 DIAGNOSIS — F250 Schizoaffective disorder, bipolar type: Secondary | ICD-10-CM | POA: Diagnosis not present

## 2022-12-30 NOTE — Plan of Care (Signed)
  Problem: Health Behavior/Discharge Planning: Goal: Ability to manage health-related needs will improve Outcome: Progressing   Problem: Clinical Measurements: Goal: Ability to maintain clinical measurements within normal limits will improve Outcome: Progressing Goal: Diagnostic test results will improve Outcome: Progressing   Problem: Nutrition: Goal: Adequate nutrition will be maintained Outcome: Progressing   Problem: Coping: Goal: Level of anxiety will decrease Outcome: Progressing   Problem: Elimination: Goal: Will not experience complications related to bowel motility Outcome: Progressing Goal: Will not experience complications related to urinary retention Outcome: Progressing

## 2022-12-30 NOTE — Group Note (Signed)
Recreation Therapy Group Note   Group Topic:Leisure Education  Group Date: 12/30/2022 Start Time: 1400 End Time: 1445 Facilitators: Rosina Lowenstein, LRT, CTRS Location: Courtyard  Group Description: Music. Patients encouraged to name their favorite song(s) for LRT to play song through speaker for group to hear. Patient educated on the definition of leisure and the importance of having different leisure interests outside of the hospital. Group discussed how leisure activities can often be used as Pharmacologist and that listening to music is one example.   Goal Area(s) Addressed:  Patient will identify a current leisure interest.  Patient will practice making a positive decision. Patient will have the opportunity to try a new leisure activity.   Affect/Mood: Appropriate   Participation Level: Active and Engaged   Participation Quality: Independent   Behavior: Calm and Cooperative   Speech/Thought Process: Coherent   Insight: Good   Judgement: Good   Modes of Intervention: Activity   Patient Response to Interventions:  Engaged and Receptive   Education Outcome:  Acknowledges education   Clinical Observations/Individualized Feedback: Kandise was active in their participation of session activities and group discussion. Pt requested multiple different songs while in group. Pt interacted well with LRT and peers duration of session.   Plan: Continue to engage patient in RT group sessions 2-3x/week.   Rosina Lowenstein, LRT, CTRS 12/30/2022 2:56 PM

## 2022-12-30 NOTE — Progress Notes (Signed)
   12/30/22 2000  Psych Admission Type (Psych Patients Only)  Admission Status Voluntary  Psychosocial Assessment  Patient Complaints None  Eye Contact Fair  Facial Expression Flat  Affect Flat  Speech Soft  Interaction Minimal  Motor Activity Slow  Appearance/Hygiene Unremarkable  Behavior Characteristics Cooperative  Mood Pleasant  Thought Process  Coherency WDL  Content WDL  Delusions None reported or observed  Perception Hallucinations  Hallucination Auditory  Judgment WDL  Confusion None  Danger to Self  Current suicidal ideation? Denies  Agreement Not to Harm Self Yes  Description of Agreement Verbal  Danger to Others  Danger to Others None reported or observed  Danger to Others Abnormal  Harmful Behavior to others No threats or harm toward other people  Destructive Behavior No threats or harm toward property

## 2022-12-30 NOTE — Group Note (Signed)
Date:  12/30/2022 Time:  10:55 AM  Group Topic/Focus:  Movement therapy    Participation Level:  Active  Participation Quality:  Appropriate  Affect:  Appropriate  Cognitive:  Appropriate  Insight: Appropriate  Engagement in Group:  Engaged  Modes of Intervention:  Activity  Additional Comments:  none  Rodena Goldmann 12/30/2022, 10:55 AM

## 2022-12-30 NOTE — Progress Notes (Signed)
[facility] MD Progress Note  Betty Henderson  MRN:  [id number]  Subjective: Case discussed in multidisciplinary meeting today, chart reviewed, patient seen today during rounds.  Social worker informed that patient has been assigned placement coordinator through DSS.  Social worker also informed that DSS has recommended to have a meeting with patient's family, especially patient's son.  Apparently DSS wants patient's son to take over the guardianship. No behavioral issues reported by the staff.  Patient reports she is doing fine today.  No new acute events overnight reported by staff or patient.  Patient reports her appetite is good.  Patient denies any thoughts of harming herself or others.  Patient denies auditory or visual hallucinations. Patient is awaiting placement.  Principal Problem: Schizoaffective disorder, bipolar type (HCC) Diagnosis: Principal Problem:   Schizoaffective disorder, bipolar type (HCC)   Past Psychiatric History: Schizoaffective disorder, bipolar type.  Past Medical History:  Past Medical History:  Diagnosis Date   Anemia    Arthritis    Chronic pain    Drug-seeking behavior    Malingering    Osteopetrosis    Psychosis (HCC)    Schizoaffective disorder, bipolar type (HCC)     Past Surgical History:  Procedure Laterality Date   MOUTH SURGERY     TUBAL LIGATION     Family History:  Family History  Family history unknown: Yes   Family Psychiatric  History: Unremarkable Social History:  Social History   Substance and Sexual Activity  Alcohol Use Not Currently   Comment: 1 cocktail 3 weeks ago     Social History   Substance and Sexual Activity  Drug Use Not Currently   Types: Cocaine   Comment: states "it's legal though"    Social History   Socioeconomic History   Marital status: Single    Spouse name: Not on file   Number of children: Not on file   Years of education: Not on file   Highest education level: Not on file  Occupational History    Not on file  Tobacco Use   Smoking status: Every Day    Current packs/day: 0.50    Types: Cigarettes   Smokeless tobacco: Never  Vaping Use   Vaping status: Never Used  Substance and Sexual Activity   Alcohol use: Not Currently    Comment: 1 cocktail 3 weeks ago   Drug use: Not Currently    Types: Cocaine    Comment: states "it's legal though"   Sexual activity: Never  Other Topics Concern   Not on file  Social History Narrative   Not on file   Social Determinants of Health   Financial Resource Strain: Not on file  Food Insecurity: Food Insecurity Present (08/22/2022)   Hunger Vital Sign    Worried About Running Out of Food in the Last Year: Sometimes true    Ran Out of Food in the Last Year: Sometimes true  Transportation Needs: Patient Unable To Answer (08/22/2022)   PRAPARE - Transportation    Lack of Transportation (Medical): Patient unable to answer    Lack of Transportation (Non-Medical): Patient unable to answer  Recent Concern: Transportation Needs - Unmet Transportation Needs (06/13/2022)   Received from [facility], [facility]   [facility] - Transportation    Lack of Transportation (Medical): Not on file    Lack of Transportation (Non-Medical): Yes  Physical Activity: Not on file  Stress: Not on file  Social Connections: Unknown (08/06/2021)   Received from [facility], [facility]  Social Network    Social Network: Not on file   Additional Social History:     Patient is homeless at this time.  Social worker is working with DSS on [employer] application and  placement.                    Sleep: Good  Appetite:  Good  Current Medications: Current Facility-Administered Medications  Medication Dose Route Frequency Provider Last Rate Last Admin   acetaminophen (TYLENOL) tablet 650 mg  650 mg Oral Q6H PRN [name], DO   650 mg at 12/28/22 2138   alum & mag hydroxide-simeth (MAALOX/MYLANTA) 200-200-20 MG/5ML suspension 30 mL  30  mL Oral Q4H PRN [name], DO   30 mL at 09/20/22 1142   diphenhydrAMINE (BENADRYL) capsule 50 mg  50 mg Oral Q6H PRN [name], DO   50 mg at 09/29/22 2254   Or   diphenhydrAMINE (BENADRYL) injection 50 mg  50 mg Intramuscular Q6H PRN [name], DO       haloperidol (HALDOL) tablet 5 mg  5 mg Oral Q6H PRN [name], DO   5 mg at 09/28/22 2149   Or   haloperidol lactate (HALDOL) injection 5 mg  5 mg Intramuscular Q6H PRN [name], DO       ibuprofen (ADVIL) tablet 600 mg  600 mg Oral Q6H PRN [name], [name], MD   600 mg at 12/25/22 2150   LORazepam (ATIVAN) tablet 1 mg  1 mg Oral TID PRN [name], DO   1 mg at 09/27/22 0025   magnesium hydroxide (MILK OF MAGNESIA) suspension 30 mL  30 mL Oral Daily PRN [name], DO   30 mL at 11/08/22 1012   traZODone (DESYREL) tablet 50 mg  50 mg Oral QHS PRN [name], DO   50 mg at 12/29/22 2124    Lab Results: No results found for this or any previous visit (from the past 48 hour(s)).  Blood Alcohol level:  Lab Results  Component Value Date   ETH <10 08/17/2022   ETH <10 01/11/2021      Musculoskeletal: Strength & Muscle Tone: within normal limits Gait & Station: normal Patient leans: N/A   Psychiatric Specialty Exam:   Presentation  General Appearance:  Casual; Neat   Eye Contact: Fair   Speech: Spontaneous   Speech Volume: Normal   Handedness: Right     Mood and Affect  Mood: "Good"   Affect: Stable    Thought Processes: Improved,probably at baseline.    Descriptions of Associations: Intact   Orientation: Well-oriented   Thought Content:Denies SI/HI, at baseline   Hallucinations:Denies ,   Ideas of Reference:None noted   Suicidal Thoughts:Denies SI  Homicidal Thoughts:Denies HI   Sensorium  Memory: Immediate Fair; Remote Poor   Judgment: Fair, pt is Rx compliant   Insight: Improved      Executive Functions  Concentration: Improved   Attention Span: Improved   Language: Fair     Psychomotor Activity  Psychomotor Activity: Normal  Assets  Assets: Communication Skills     Sleep  Sleep:Improved     Physical Exam: Physical Exam Vitals and nursing note reviewed.  Constitutional:      Appearance: Normal appearance. She is normal weight.  Neurological:     General: No focal deficit present.     Mental Status: She is alert.      Review of Systems  Constitutional:  Negative.   HENT: Negative.    Eyes: Negative.   Respiratory: Negative.    Cardiovascular: Negative.   Gastrointestinal: Negative.   Genitourinary: Negative.   Musculoskeletal: Negative.   Skin: Negative.   Neurological: Negative.   Endo/Heme/Allergies: Negative.     Blood pressure 117/71, pulse 94, temperature 97.6 F (36.4 C), resp. rate 18, height 5\' 6"  (1.676 m), weight (P) 82.8 kg, SpO2 100%. Body mass index is 29.46 kg/m (pended).   Treatment Plan Summary: Daily contact with patient to assess and evaluate symptoms and progress in treatment, Medication management, and Plan continue current medications.  Continue to monitor patient on as needed meds   Patient received  dose of Invega Sustenna 156 mg IM on 12/18/2022 Repeat every 4 weeks     [name], MD

## 2022-12-30 NOTE — Progress Notes (Signed)
   12/30/22 0557  15 Minute Checks  Location Bedroom  Visual Appearance Calm  Behavior Sleeping  Sleep (Behavioral Health Patients Only)  Calculate sleep? (Click Yes once per 24 hr at 0600 safety check) Yes  Documented sleep last 24 hours 10.5

## 2022-12-30 NOTE — Group Note (Signed)
Date:  12/30/2022 Time:  10:49 PM  Group Topic/Focus:  Wellness Toolbox:   The focus of this group is to discuss various aspects of wellness, balancing those aspects and exploring ways to increase the ability to experience wellness.  Patients will create a wellness toolbox for use upon discharge.    Participation Level:  Did Not Attend  Participation Quality:      Affect:      Cognitive:      Insight: None  Engagement in Group:  None  Modes of Intervention:      Additional Comments:    [name] 12/30/2022, 10:49 PM

## 2022-12-31 DIAGNOSIS — F250 Schizoaffective disorder, bipolar type: Secondary | ICD-10-CM | POA: Diagnosis not present

## 2022-12-31 NOTE — Group Note (Signed)
Recreation Therapy Group Note   Group Topic:Relaxation  Group Date: 12/31/2022 Start Time: 1400 End Time: 1455 Facilitators: Rosina Lowenstein, LRT, CTRS Location: Courtyard  Group Description: Meditation. LRT and patients discussed what they know about meditation and mindfulness. LRT played a Deep Breathing Meditation exercise script for patients to follow along to. LRT and patients discussed how meditation and deep breathing can be used as a coping skill post--discharge. After the meditation, LRT took song requests from pts to listen to while getting fresh air and sunlight.  Goal Area(s) Addressed: Patient will practice using relaxation technique. Patient will identify a new coping skill.  Patient will follow multistep directions to reduce anxiety and stress.   Affect/Mood: Appropriate   Participation Level: Active and Engaged   Participation Quality: Independent   Behavior: Calm and Cooperative   Speech/Thought Process: Coherent   Insight: Good   Judgement: Good   Modes of Intervention: Activity   Patient Response to Interventions:  Attentive, Interested , and Receptive   Education Outcome:  Acknowledges education   Clinical Observations/Individualized Feedback: Betty Henderson was active in their participation of session activities and group discussion. Pt followed along with the prompt appropriately. Pt interacted well with LRT and peers duration of session.   Plan: Continue to engage patient in RT group sessions 2-3x/week.   Rosina Lowenstein, LRT, CTRS 12/31/2022 3:00 PM

## 2022-12-31 NOTE — Group Note (Signed)
Date:  12/31/2022 Time:  11:04 AM  Group Topic/Focus:  Outside Rec /Music Therapy This group is to allow patients to get fresh air while doing outside activities while also listening to music     Participation Level:  Active  Participation Quality:  Appropriate  Affect:  Appropriate  Cognitive:  Appropriate and Confused  Insight: Good and Improving  Engagement in Group:  Engaged and Improving  Modes of Intervention:  Activity and Confrontation  Additional Comments:    [name] 12/31/2022, 11:04 AM

## 2022-12-31 NOTE — Progress Notes (Signed)
[facility] MD Progress Note  12/31/2022 1:41 PM Betty Henderson  MRN:  [id number] Subjective: No changes with Betty Henderson.  She has been pleasant and cooperative on the unit.  She has been compliant with medications.  No side effects.  She remains pleasantly delusional at times.  DSS told social work that they would like her son to take over guardianship. Principal Problem: Schizoaffective disorder, bipolar type (HCC) Diagnosis: Principal Problem:   Schizoaffective disorder, bipolar type (HCC)  Total Time spent with patient: 15 minutes  Past Psychiatric History: Schizoaffective disorder bipolar type  Past Medical History:  Past Medical History:  Diagnosis Date   Anemia    Arthritis    Chronic pain    Drug-seeking behavior    Malingering    Osteopetrosis    Psychosis (HCC)    Schizoaffective disorder, bipolar type (HCC)     Past Surgical History:  Procedure Laterality Date   MOUTH SURGERY     TUBAL LIGATION     Family History:  Family History  Family history unknown: Yes   Family Psychiatric  History: Unremarkable Social History:  Social History   Substance and Sexual Activity  Alcohol Use Not Currently   Comment: 1 cocktail 3 weeks ago     Social History   Substance and Sexual Activity  Drug Use Not Currently   Types: Cocaine   Comment: states "it's legal though"    Social History   Socioeconomic History   Marital status: Single    Spouse name: Not on file   Number of children: Not on file   Years of education: Not on file   Highest education level: Not on file  Occupational History   Not on file  Tobacco Use   Smoking status: Every Day    Current packs/day: 0.50    Types: Cigarettes   Smokeless tobacco: Never  Vaping Use   Vaping status: Never Used  Substance and Sexual Activity   Alcohol use: Not Currently    Comment: 1 cocktail 3 weeks ago   Drug use: Not Currently    Types: Cocaine    Comment: states "it's legal though"   Sexual activity: Never  Other  Topics Concern   Not on file  Social History Narrative   Not on file   Social Determinants of Health   Financial Resource Strain: Not on file  Food Insecurity: Food Insecurity Present (08/22/2022)   Hunger Vital Sign    Worried About Running Out of Food in the Last Year: Sometimes true    Ran Out of Food in the Last Year: Sometimes true  Transportation Needs: Patient Unable To Answer (08/22/2022)   PRAPARE - Transportation    Lack of Transportation (Medical): Patient unable to answer    Lack of Transportation (Non-Medical): Patient unable to answer  Recent Concern: Transportation Needs - Unmet Transportation Needs (06/13/2022)   Received from [facility], [facility]   [facility] - Transportation    Lack of Transportation (Medical): Not on file    Lack of Transportation (Non-Medical): Yes  Physical Activity: Not on file  Stress: Not on file  Social Connections: Unknown (08/06/2021)   Received from [facility], [facility]   Social Network    Social Network: Not on file   Additional Social History:                         Sleep: Good  Appetite:  Good  Current Medications: Current Facility-Administered Medications  Medication Dose  Route Frequency Provider Last Rate Last Admin   acetaminophen (TYLENOL) tablet 650 mg  650 mg Oral Q6H PRN [name], DO   650 mg at 12/30/22 2105   alum & mag hydroxide-simeth (MAALOX/MYLANTA) 200-200-20 MG/5ML suspension 30 mL  30 mL Oral Q4H PRN [name], DO   30 mL at 09/20/22 1142   diphenhydrAMINE (BENADRYL) capsule 50 mg  50 mg Oral Q6H PRN [name], DO   50 mg at 09/29/22 2254   Or   diphenhydrAMINE (BENADRYL) injection 50 mg  50 mg Intramuscular Q6H PRN [name], DO       haloperidol (HALDOL) tablet 5 mg  5 mg Oral Q6H PRN [name], DO   5 mg at 09/28/22 2149   Or   haloperidol lactate (HALDOL) injection 5 mg  5 mg Intramuscular Q6H PRN [name], DO       ibuprofen (ADVIL) tablet 600 mg  600 mg Oral Q6H PRN [name], [name], MD   600 mg at 12/25/22 2150   LORazepam (ATIVAN) tablet 1 mg  1 mg Oral TID PRN [name], DO   1 mg at 09/27/22 0025   magnesium hydroxide (MILK OF MAGNESIA) suspension 30 mL  30 mL Oral Daily PRN [name], DO   30 mL at 11/08/22 1012   traZODone (DESYREL) tablet 50 mg  50 mg Oral QHS PRN [name], DO   50 mg at 12/30/22 2105    Lab Results: No results found for this or any previous visit (from the past 48 hour(s)).  Blood Alcohol level:  Lab Results  Component Value Date   ETH <10 08/17/2022   ETH <10 01/11/2021    Metabolic Disorder Labs: No results found for: "HGBA1C", "MPG" No results found for: "PROLACTIN" No results found for: "CHOL", "TRIG", "HDL", "CHOLHDL", "VLDL", "LDLCALC"  Physical Findings: AIMS:  , ,  ,  ,    CIWA:    COWS:     Musculoskeletal: Strength & Muscle Tone: within normal limits Gait & Station: normal Patient leans: N/A  Psychiatric Specialty Exam:  Presentation  General Appearance:  Casual; Neat  Eye Contact: Fair  Speech: Slow; Slurred  Speech Volume: Decreased  Handedness: Right   Mood and Affect  Mood: Anxious  Affect: Inappropriate   Thought Process  Thought Processes: Disorganized  Descriptions of Associations:Loose  Orientation:Partial  Thought Content:Illogical; Rumination  History of Schizophrenia/Schizoaffective disorder:No data recorded Duration of Psychotic Symptoms:No data recorded Hallucinations:No data recorded Ideas of Reference:Paranoia  Suicidal Thoughts:No data recorded Homicidal Thoughts:No data recorded  Sensorium  Memory: Immediate Fair; Remote Poor  Judgment: Poor  Insight: Poor   Executive Functions  Concentration: Poor  Attention Span: Fair  Recall: [employer] of Knowledge: Fair  Language: Fair   Psychomotor Activity  Psychomotor  Activity:No data recorded  Assets  Assets: Communication Skills   Sleep  Sleep:No data recorded    Blood pressure 113/60, pulse 74, temperature (!) 97.1 F (36.2 C), resp. rate 18, height 5\' 6"  (1.676 m), weight (P) 82.8 kg, SpO2 98%. Body mass index is 29.46 kg/m (pended).   Treatment Plan Summary: Daily contact with patient to assess and evaluate symptoms and progress in treatment, Medication management, and Plan continue current medications.  [name], DO 12/31/2022, 1:41 PM

## 2022-12-31 NOTE — Progress Notes (Signed)
Patient pleasant and cooperative.  Flat affect.  Denies SI/HI, anxiety and depression.  No responding to internal stimuli observed.     No scheduled medications.  15 min checks in place for safety.  Isolated to room with the exception of meals.  Patient participated in MHT and RT groups.

## 2022-12-31 NOTE — Progress Notes (Signed)
PRN Tylenol given for 10/10 pain in legs.

## 2022-12-31 NOTE — Progress Notes (Signed)
   12/31/22 0601  15 Minute Checks  Location Bedroom  Visual Appearance Calm  Behavior Sleeping  Sleep (Behavioral Health Patients Only)  Calculate sleep? (Click Yes once per 24 hr at 0600 safety check) Yes  Documented sleep last 24 hours 11.5

## 2022-12-31 NOTE — Group Note (Signed)
Date:  12/31/2022 Time:  10:53 PM  Group Topic/Focus:  Personal Choices and Values:   The focus of this group is to help patients assess and explore the importance of values in their lives, how their values affect their decisions, how they express their values and what opposes their expression.    Participation Level:  Did Not Attend  Participation Quality:      Affect:      Cognitive:      Insight: None  Engagement in Group:  None  Modes of Intervention:      Additional Comments:    [name] 12/31/2022, 10:53 PM

## 2022-12-31 NOTE — Progress Notes (Signed)
   12/31/22 1500  Spiritual Encounters  Type of Visit Initial  Care provided to: Patient  Spiritual Framework  Presenting Themes Impactful experiences and emotions   Patients were outside with activities staff and chaplain went out to talk with some of the patients. Patient shared with chaplain that she liked the music that was being played. Patient started talking about the [employer] and shared with me that she used to sing in church choirs for years, as an Tax adviser. Patient seemed very calm and content today. Patient appreciated the chaplain stopping to talk with her.

## 2022-12-31 NOTE — Plan of Care (Signed)

## 2023-01-01 DIAGNOSIS — F250 Schizoaffective disorder, bipolar type: Secondary | ICD-10-CM | POA: Diagnosis not present

## 2023-01-01 NOTE — Group Note (Signed)
Date:  01/01/2023 Time:  10:21 AM  Group Topic/Focus:  Managing Feelings:   The focus of this group is to identify what feelings patients have difficulty handling and develop a plan to handle them in a healthier way upon discharge.    Participation Level:  Active  Participation Quality:  Appropriate  Affect:  Appropriate  Cognitive:  Appropriate  Insight: Appropriate  Engagement in Group:  Engaged  Modes of Intervention:  Discussion and Education  Additional Comments:  none  Rodena Goldmann 01/01/2023, 10:21 AM

## 2023-01-01 NOTE — Group Note (Signed)
[facility] LCSW Group Therapy Note   Group Date: 01/01/2023 Start Time: 1330 End Time: 1415   Type of Therapy/Topic:  Group Therapy:  Emotion Regulation  Participation Level:  Did Not Attend   Mood:  Description of Group:    The purpose of this group is to assist patients in learning to regulate negative emotions and experience positive emotions. Patients will be guided to discuss ways in which they have been vulnerable to their negative emotions. These vulnerabilities will be juxtaposed with experiences of positive emotions or situations, and patients challenged to use positive emotions to combat negative ones. Special emphasis will be placed on coping with negative emotions in conflict situations, and patients will process healthy conflict resolution skills.  Therapeutic Goals: Patient will identify two positive emotions or experiences to reflect on in order to balance out negative emotions:  Patient will label two or more emotions that they find the most difficult to experience:  Patient will be able to demonstrate positive conflict resolution skills through discussion or role plays:   Summary of Patient Progress:   X    Therapeutic Modalities:   Cognitive Behavioral Therapy Feelings Identification Dialectical Behavioral Therapy   Harden Mo, LCSW

## 2023-01-01 NOTE — Progress Notes (Signed)
[facility] MD Progress Note  01/01/2023 11:07 AM Betty Henderson  MRN:  [id number] Subjective: Betty Henderson is seen on rounds.  Social work continues to work with DSS on placement.  DSS wants family to take over guardianship.  Betty Henderson has been very pleasant and cooperative.  She has been compliant with medications and denies any side effects.  She is on [place] 156 on the 25th of each month.  No side effects.  Nurses report no issues. Principal Problem: Schizoaffective disorder, bipolar type (HCC) Diagnosis: Principal Problem:   Schizoaffective disorder, bipolar type (HCC)  Total Time spent with patient: 15 minutes  Past Psychiatric History: Schizoaffective disorder  Past Medical History:  Past Medical History:  Diagnosis Date   Anemia    Arthritis    Chronic pain    Drug-seeking behavior    Malingering    Osteopetrosis    Psychosis (HCC)    Schizoaffective disorder, bipolar type (HCC)     Past Surgical History:  Procedure Laterality Date   MOUTH SURGERY     TUBAL LIGATION     Family History:  Family History  Family history unknown: Yes   Family Psychiatric  History: Unremarkable Social History:  Social History   Substance and Sexual Activity  Alcohol Use Not Currently   Comment: 1 cocktail 3 weeks ago     Social History   Substance and Sexual Activity  Drug Use Not Currently   Types: Cocaine   Comment: states "it's legal though"    Social History   Socioeconomic History   Marital status: Single    Spouse name: Not on file   Number of children: Not on file   Years of education: Not on file   Highest education level: Not on file  Occupational History   Not on file  Tobacco Use   Smoking status: Every Day    Current packs/day: 0.50    Types: Cigarettes   Smokeless tobacco: Never  Vaping Use   Vaping status: Never Used  Substance and Sexual Activity   Alcohol use: Not Currently    Comment: 1 cocktail 3 weeks ago   Drug use: Not Currently    Types:  Cocaine    Comment: states "it's legal though"   Sexual activity: Never  Other Topics Concern   Not on file  Social History Narrative   Not on file   Social Determinants of Health   Financial Resource Strain: Not on file  Food Insecurity: Food Insecurity Present (08/22/2022)   Hunger Vital Sign    Worried About Running Out of Food in the Last Year: Sometimes true    Ran Out of Food in the Last Year: Sometimes true  Transportation Needs: Patient Unable To Answer (08/22/2022)   PRAPARE - Transportation    Lack of Transportation (Medical): Patient unable to answer    Lack of Transportation (Non-Medical): Patient unable to answer  Recent Concern: Transportation Needs - Unmet Transportation Needs (06/13/2022)   Received from [facility], [facility]   [facility] - Transportation    Lack of Transportation (Medical): Not on file    Lack of Transportation (Non-Medical): Yes  Physical Activity: Not on file  Stress: Not on file  Social Connections: Unknown (08/06/2021)   Received from [facility], [facility]   Social Network    Social Network: Not on file   Additional Social History:  Sleep: Good  Appetite:  Good  Current Medications: Current Facility-Administered Medications  Medication Dose Route Frequency Provider Last Rate Last Admin   acetaminophen (TYLENOL) tablet 650 mg  650 mg Oral Q6H PRN [name], DO   650 mg at 12/31/22 1804   alum & mag hydroxide-simeth (MAALOX/MYLANTA) 200-200-20 MG/5ML suspension 30 mL  30 mL Oral Q4H PRN [name], DO   30 mL at 09/20/22 1142   diphenhydrAMINE (BENADRYL) capsule 50 mg  50 mg Oral Q6H PRN [name], DO   50 mg at 09/29/22 2254   Or   diphenhydrAMINE (BENADRYL) injection 50 mg  50 mg Intramuscular Q6H PRN [name], DO       haloperidol (HALDOL) tablet 5 mg  5 mg Oral Q6H PRN [name], DO   5 mg at 09/28/22 2149   Or    haloperidol lactate (HALDOL) injection 5 mg  5 mg Intramuscular Q6H PRN [name], DO       ibuprofen (ADVIL) tablet 600 mg  600 mg Oral Q6H PRN [name], [name], MD   600 mg at 12/31/22 2106   LORazepam (ATIVAN) tablet 1 mg  1 mg Oral TID PRN [name], DO   1 mg at 09/27/22 0025   magnesium hydroxide (MILK OF MAGNESIA) suspension 30 mL  30 mL Oral Daily PRN [name], DO   30 mL at 11/08/22 1012   traZODone (DESYREL) tablet 50 mg  50 mg Oral QHS PRN [name], DO   50 mg at 12/31/22 2102    Lab Results: No results found for this or any previous visit (from the past 48 hour(s)).  Blood Alcohol level:  Lab Results  Component Value Date   ETH <10 08/17/2022   ETH <10 01/11/2021    Metabolic Disorder Labs: No results found for: "HGBA1C", "MPG" No results found for: "PROLACTIN" No results found for: "CHOL", "TRIG", "HDL", "CHOLHDL", "VLDL", "LDLCALC"  Physical Findings: AIMS:  , ,  ,  ,    CIWA:    COWS:     Musculoskeletal: Strength & Muscle Tone: within normal limits Gait & Station: normal Patient leans: N/A  Psychiatric Specialty Exam:  Presentation  General Appearance:  Casual; Neat  Eye Contact: Fair  Speech: Slow; Slurred  Speech Volume: Decreased  Handedness: Right   Mood and Affect  Mood: Anxious  Affect: Inappropriate   Thought Process  Thought Processes: Disorganized  Descriptions of Associations:Loose  Orientation:Partial  Thought Content:Illogical; Rumination  History of Schizophrenia/Schizoaffective disorder:No data recorded Duration of Psychotic Symptoms:No data recorded Hallucinations:No data recorded Ideas of Reference:Paranoia  Suicidal Thoughts:No data recorded Homicidal Thoughts:No data recorded  Sensorium  Memory: Immediate Fair; Remote Poor  Judgment: Poor  Insight: Poor   Executive Functions  Concentration: Poor  Attention  Span: Fair  Recall: [employer] of Knowledge: Fair  Language: Fair   Psychomotor Activity  Psychomotor Activity:No data recorded  Assets  Assets: Communication Skills   Sleep  Sleep:No data recorded    Blood pressure 115/65, pulse 72, temperature 97.7 F (36.5 C), resp. rate 15, height 5\' 6"  (1.676 m), weight (P) 82.8 kg, SpO2 97%. Body mass index is 29.46 kg/m (pended).   Treatment Plan Summary: Daily contact with patient to assess and evaluate symptoms and progress in treatment, Medication management, and Plan continue current medications.  Austin Herd Tresea Mall, DO 01/01/2023, 11:07 AM

## 2023-01-01 NOTE — Progress Notes (Signed)
   01/01/23 0553  15 Minute Checks  Location Bedroom  Visual Appearance Calm  Behavior Sleeping  Sleep (Behavioral Health Patients Only)  Calculate sleep? (Click Yes once per 24 hr at 0600 safety check) Yes  Documented sleep last 24 hours 11.25

## 2023-01-01 NOTE — Progress Notes (Signed)
   12/31/22 2300  Psych Admission Type (Psych Patients Only)  Admission Status Voluntary  Psychosocial Assessment  Patient Complaints None  Eye Contact Brief  Facial Expression Flat  Affect Flat  Speech Logical/coherent  Interaction Needy  Motor Activity Shuffling  Appearance/Hygiene Unremarkable  Behavior Characteristics Cooperative;Appropriate to situation  Mood Pleasant  Thought Process  Coherency WDL  Content WDL  Delusions None reported or observed  Perception WDL  Hallucination None reported or observed  Judgment WDL  Confusion None  Danger to Self  Current suicidal ideation? Denies  Agreement Not to Harm Self Yes  Description of Agreement Verbal  Danger to Others  Danger to Others None reported or observed  Danger to Others Abnormal  Harmful Behavior to others No threats or harm toward other people  Destructive Behavior No threats or harm toward property

## 2023-01-01 NOTE — Group Note (Signed)
Date:  01/01/2023 Time:  8:39 PM  Group Topic/Focus:  Goals Group:   The focus of this group is to help patients establish daily goals to achieve during treatment and discuss how the patient can incorporate goal setting into their daily lives to aide in recovery. Self Esteem Action Plan:   The focus of this group is to help patients create a plan to continue to build self-esteem after discharge.    Participation Level:  Active  Participation Quality:  Appropriate  Affect:  Appropriate  Cognitive:  Appropriate  Insight: Appropriate  Engagement in Group:  Engaged  Modes of Intervention:  Discussion  Additional Comments:    [name] 01/01/2023, 8:39 PM

## 2023-01-01 NOTE — Progress Notes (Signed)
Patient denies SI, HI, and AVH. She denies pain and other physical problems. Patient did not voice any concerns. Support and encouragement provided.

## 2023-01-01 NOTE — Group Note (Signed)
Recreation Therapy Group Note   Group Topic:General Recreation  Group Date: 01/01/2023 Start Time: 1410 End Time: 1455 Facilitators: Rosina Lowenstein, LRT, CTRS Location: Courtyard  Group Description: Outdoor Recreation. Patients had the option to play the card game rummy, ring toss, and listen to music while outside in the courtyard getting fresh air and sunlight. LRT and pts discussed things that they enjoy doing in their free time outside of the hospital.   Goal Area(s) Addressed: Patient will identify leisure interests.  Patient will practice healthy decision making. Patient will engage in recreation activity.   Affect/Mood: Appropriate   Participation Level: Active and Engaged   Participation Quality: Independent   Behavior: Calm and Cooperative   Speech/Thought Process: Coherent   Insight: Good   Judgement: Good   Modes of Intervention: Cooperative Play   Patient Response to Interventions:  Attentive, Engaged, and Receptive   Education Outcome:  Acknowledges education   Clinical Observations/Individualized Feedback: Abeera was active in their participation of session activities and group discussion. Pt won multiple games of rummy. Pt interacted well with LRT and peers duration of session.   Plan: Continue to engage patient in RT group sessions 2-3x/week.   Rosina Lowenstein, LRT, CTRS 01/01/2023 3:11 PM

## 2023-01-02 DIAGNOSIS — F250 Schizoaffective disorder, bipolar type: Secondary | ICD-10-CM | POA: Diagnosis not present

## 2023-01-02 NOTE — Group Note (Signed)
Recreation Therapy Group Note   Group Topic:Leisure Education  Group Date: 01/02/2023 Start Time: 1400 End Time: 1500 Facilitators: Rosina Lowenstein, LRT, CTRS Location:  Day Room  Group Description: Bingo. LRT and patients played multiple games of Bingo with music playing in the background. LRT and pts discussed how this could be a leisure interest and the importance of doing things they enjoy post-discharge.   Goal Area(s) Addressed: Patient will identify leisure interests.  Patient will practice healthy decision making. Patient will engage in recreation activity.    Affect/Mood: Appropriate   Participation Level: Active and Engaged   Participation Quality: Independent   Behavior: Appropriate, Calm, and Cooperative   Speech/Thought Process: Coherent   Insight: Good   Judgement: Good   Modes of Intervention: Cooperative Play   Patient Response to Interventions:  Attentive, Engaged, Interested , and Receptive   Education Outcome:  Acknowledges education   Clinical Observations/Individualized Feedback: Betty Henderson was active in their participation of session activities and group discussion. Pt won a Facilities manager and chose chapstick as her prize. Pt interacted well with LRT and peers duration of session.   Plan: Continue to engage patient in RT group sessions 2-3x/week.   Rosina Lowenstein, LRT, CTRS 01/02/2023 3:09 PM

## 2023-01-02 NOTE — Progress Notes (Signed)
   01/01/23 2300  Psych Admission Type (Psych Patients Only)  Admission Status Voluntary  Psychosocial Assessment  Patient Complaints None  Eye Contact Brief  Facial Expression Flat  Affect Flat  Speech Logical/coherent  Interaction Minimal  Motor Activity Shuffling  Appearance/Hygiene Layered clothes  Behavior Characteristics Cooperative;Appropriate to situation  Mood Pleasant  Thought Process  Coherency WDL  Content WDL  Delusions None reported or observed  Perception WDL  Hallucination None reported or observed  Judgment WDL  Confusion None  Danger to Self  Current suicidal ideation? Denies  Agreement Not to Harm Self Yes  Description of Agreement verbal  Danger to Others  Danger to Others None reported or observed  Danger to Others Abnormal  Harmful Behavior to others No threats or harm toward other people  Destructive Behavior No threats or harm toward property

## 2023-01-02 NOTE — Progress Notes (Signed)
Patient is a voluntary admission to [facility] for SAD- Bipolar awaiting placement. Patient is calm, cooperative, friendly with staff and peers. Denies SI, HI, AVH, Anxiety and Depression.  Will continue to monitor.

## 2023-01-02 NOTE — Progress Notes (Signed)
   01/02/23 0600  15 Minute Checks  Location Bedroom  Visual Appearance Calm  Behavior Sleeping  Sleep (Behavioral Health Patients Only)  Calculate sleep? (Click Yes once per 24 hr at 0600 safety check) Yes  Documented sleep last 24 hours 11.75

## 2023-01-02 NOTE — Progress Notes (Signed)
[facility] MD Progress Note  01/02/2023 1:32 PM Betty Henderson  MRN:  [id number] Subjective: [name] is seen on rounds.  She has been pleasant and cooperative.  Still awaiting placement.  She has not had any complaints.  Compliant with medication.  No side effects. Principal Problem: Schizoaffective disorder, bipolar type (HCC) Diagnosis: Principal Problem:   Schizoaffective disorder, bipolar type (HCC)  Total Time spent with patient: 15 minutes  Past Psychiatric History: Schizoaffective disorder  Past Medical History:  Past Medical History:  Diagnosis Date   Anemia    Arthritis    Chronic pain    Drug-seeking behavior    Malingering    Osteopetrosis    Psychosis (HCC)    Schizoaffective disorder, bipolar type (HCC)     Past Surgical History:  Procedure Laterality Date   MOUTH SURGERY     TUBAL LIGATION     Family History:  Family History  Family history unknown: Yes   Family Psychiatric  History: Unremarkable Social History:  Social History   Substance and Sexual Activity  Alcohol Use Not Currently   Comment: 1 cocktail 3 weeks ago     Social History   Substance and Sexual Activity  Drug Use Not Currently   Types: Cocaine   Comment: states "it's legal though"    Social History   Socioeconomic History   Marital status: Single    Spouse name: Not on file   Number of children: Not on file   Years of education: Not on file   Highest education level: Not on file  Occupational History   Not on file  Tobacco Use   Smoking status: Every Day    Current packs/day: 0.50    Types: Cigarettes   Smokeless tobacco: Never  Vaping Use   Vaping status: Never Used  Substance and Sexual Activity   Alcohol use: Not Currently    Comment: 1 cocktail 3 weeks ago   Drug use: Not Currently    Types: Cocaine    Comment: states "it's legal though"   Sexual activity: Never  Other Topics Concern   Not on file  Social History Narrative   Not on file   Social Determinants of  Health   Financial Resource Strain: Not on file  Food Insecurity: Food Insecurity Present (08/22/2022)   Hunger Vital Sign    Worried About Running Out of Food in the Last Year: Sometimes true    Ran Out of Food in the Last Year: Sometimes true  Transportation Needs: Patient Unable To Answer (08/22/2022)   PRAPARE - Transportation    Lack of Transportation (Medical): Patient unable to answer    Lack of Transportation (Non-Medical): Patient unable to answer  Recent Concern: Transportation Needs - Unmet Transportation Needs (06/13/2022)   Received from [facility], [facility]   [facility] - Transportation    Lack of Transportation (Medical): Not on file    Lack of Transportation (Non-Medical): Yes  Physical Activity: Not on file  Stress: Not on file  Social Connections: Unknown (08/06/2021)   Received from [facility], [facility]   Social Network    Social Network: Not on file   Additional Social History:                         Sleep: Good  Appetite:  Good  Current Medications: Current Facility-Administered Medications  Medication Dose Route Frequency Provider Last Rate Last Admin   acetaminophen (TYLENOL) tablet 650 mg  650 mg Oral  Q6H PRN [name], DO   650 mg at 01/01/23 2101   alum & mag hydroxide-simeth (MAALOX/MYLANTA) 200-200-20 MG/5ML suspension 30 mL  30 mL Oral Q4H PRN [name], DO   30 mL at 09/20/22 1142   diphenhydrAMINE (BENADRYL) capsule 50 mg  50 mg Oral Q6H PRN [name], DO   50 mg at 09/29/22 2254   Or   diphenhydrAMINE (BENADRYL) injection 50 mg  50 mg Intramuscular Q6H PRN [name], DO       haloperidol (HALDOL) tablet 5 mg  5 mg Oral Q6H PRN [name], DO   5 mg at 09/28/22 2149   Or   haloperidol lactate (HALDOL) injection 5 mg  5 mg Intramuscular Q6H PRN [name], DO       ibuprofen (ADVIL) tablet 600 mg  600 mg Oral Q6H PRN [name], [name], MD   600 mg  at 12/31/22 2106   LORazepam (ATIVAN) tablet 1 mg  1 mg Oral TID PRN [name], DO   1 mg at 09/27/22 0025   magnesium hydroxide (MILK OF MAGNESIA) suspension 30 mL  30 mL Oral Daily PRN [name], DO   30 mL at 11/08/22 1012   traZODone (DESYREL) tablet 50 mg  50 mg Oral QHS PRN [name], DO   50 mg at 01/01/23 2101    Lab Results: No results found for this or any previous visit (from the past 48 hour(s)).  Blood Alcohol level:  Lab Results  Component Value Date   ETH <10 08/17/2022   ETH <10 01/11/2021    Metabolic Disorder Labs: No results found for: "HGBA1C", "MPG" No results found for: "PROLACTIN" No results found for: "CHOL", "TRIG", "HDL", "CHOLHDL", "VLDL", "LDLCALC"  Physical Findings: AIMS:  , ,  ,  ,    CIWA:    COWS:     Musculoskeletal: Strength & Muscle Tone: within normal limits Gait & Station: normal Patient leans: N/A  Psychiatric Specialty Exam:  Presentation  General Appearance:  Casual; Neat  Eye Contact: Fair  Speech: Slow; Slurred  Speech Volume: Decreased  Handedness: Right   Mood and Affect  Mood: Anxious  Affect: Inappropriate   Thought Process  Thought Processes: Disorganized  Descriptions of Associations:Loose  Orientation:Partial  Thought Content:Illogical; Rumination  History of Schizophrenia/Schizoaffective disorder:No data recorded Duration of Psychotic Symptoms:No data recorded Hallucinations:No data recorded Ideas of Reference:Paranoia  Suicidal Thoughts:No data recorded Homicidal Thoughts:No data recorded  Sensorium  Memory: Immediate Fair; Remote Poor  Judgment: Poor  Insight: Poor   Executive Functions  Concentration: Poor  Attention Span: Fair  Recall: [employer] of Knowledge: Fair  Language: Fair   Psychomotor Activity  Psychomotor Activity:No data recorded  Assets  Assets: Communication Skills   Sleep  Sleep:No data  recorded    Blood pressure (!) 123/56, pulse 75, temperature (!) 97.3 F (36.3 C), resp. rate 16, height 5\' 6"  (1.676 m), weight (P) 82.8 kg, SpO2 99%. Body mass index is 29.46 kg/m (pended).   Treatment Plan Summary: Daily contact with patient to assess and evaluate symptoms and progress in treatment, Medication management, and Plan continue current medications.  Rayn Enderson Tresea Mall, DO 01/02/2023, 1:32 PM

## 2023-01-03 DIAGNOSIS — F250 Schizoaffective disorder, bipolar type: Secondary | ICD-10-CM | POA: Diagnosis not present

## 2023-01-03 NOTE — BH IP Treatment Plan (Signed)
Interdisciplinary Treatment and Diagnostic Plan Update  01/03/2023 Time of Session: 10:00 AM  Betty Henderson MRN: [id number]  Principal Diagnosis: Schizoaffective disorder, bipolar type (HCC)  Secondary Diagnoses: Principal Problem:   Schizoaffective disorder, bipolar type (HCC)   Current Medications:  Current Facility-Administered Medications  Medication Dose Route Frequency Provider Last Rate Last Admin   acetaminophen (TYLENOL) tablet 650 mg  650 mg Oral Q6H PRN [name], DO   650 mg at 01/03/23 1010   alum & mag hydroxide-simeth (MAALOX/MYLANTA) 200-200-20 MG/5ML suspension 30 mL  30 mL Oral Q4H PRN [name], DO   30 mL at 09/20/22 1142   diphenhydrAMINE (BENADRYL) capsule 50 mg  50 mg Oral Q6H PRN [name], DO   50 mg at 09/29/22 2254   Or   diphenhydrAMINE (BENADRYL) injection 50 mg  50 mg Intramuscular Q6H PRN [name], DO       haloperidol (HALDOL) tablet 5 mg  5 mg Oral Q6H PRN [name], DO   5 mg at 09/28/22 2149   Or   haloperidol lactate (HALDOL) injection 5 mg  5 mg Intramuscular Q6H PRN [name], DO       ibuprofen (ADVIL) tablet 600 mg  600 mg Oral Q6H PRN [name], [name], MD   600 mg at 12/31/22 2106   LORazepam (ATIVAN) tablet 1 mg  1 mg Oral TID PRN [name], DO   1 mg at 09/27/22 0025   magnesium hydroxide (MILK OF MAGNESIA) suspension 30 mL  30 mL Oral Daily PRN [name], DO   30 mL at 11/08/22 1012   traZODone (DESYREL) tablet 50 mg  50 mg Oral QHS PRN [name], DO   50 mg at 01/02/23 2127   PTA Medications: Medications Prior to Admission  Medication Sig Dispense Refill Last Dose   ondansetron (ZOFRAN) 4 MG tablet Take 1 tablet (4 mg total) by mouth every 8 (eight) hours as needed for nausea or vomiting. (Patient not taking: Reported on 08/17/2022) 20 tablet 0     Patient Stressors: Financial difficulties   Health problems    Medication change or noncompliance    Patient Strengths: Ability for insight   Treatment Modalities: Medication Management, Group therapy, Case management,  1 to 1 session with clinician, Psychoeducation, Recreational therapy.   Physician Treatment Plan for Primary Diagnosis: Schizoaffective disorder, bipolar type (HCC) Long Term Goal(s): Improvement in symptoms so as ready for discharge   Short Term Goals: Ability to identify changes in lifestyle to reduce recurrence of condition will improve Ability to verbalize feelings will improve Ability to disclose and discuss suicidal ideas Ability to demonstrate self-control will improve Ability to identify and develop effective coping behaviors will improve Ability to maintain clinical measurements within normal limits will improve Compliance with prescribed medications will improve Ability to identify triggers associated with substance abuse/mental health issues will improve  Medication Management: Evaluate patient's response, side effects, and tolerance of medication regimen.  Therapeutic Interventions: 1 to 1 sessions, Unit Group sessions and Medication administration.  Evaluation of Outcomes: Progressing  Physician Treatment Plan for Secondary Diagnosis: Principal Problem:   Schizoaffective disorder, bipolar type (HCC)  Long Term Goal(s): Improvement in symptoms so as ready for discharge   Short Term Goals: Ability to identify changes in lifestyle to reduce recurrence of condition will improve Ability to verbalize feelings will improve Ability to disclose and discuss suicidal ideas Ability to demonstrate self-control will improve Ability to identify  and develop effective coping behaviors will improve Ability to maintain clinical measurements within normal limits will improve Compliance with prescribed medications will improve Ability to identify triggers associated with substance abuse/mental health issues will improve      Medication Management: Evaluate patient's response, side effects, and tolerance of medication regimen.  Therapeutic Interventions: 1 to 1 sessions, Unit Group sessions and Medication administration.  Evaluation of Outcomes: Progressing   RN Treatment Plan for Primary Diagnosis: Schizoaffective disorder, bipolar type (HCC) Long Term Goal(s): Knowledge of disease and therapeutic regimen to maintain health will improve  Short Term Goals: Ability to remain free from injury will improve, Ability to verbalize frustration and anger appropriately will improve, Ability to demonstrate self-control, Ability to participate in decision making will improve, Ability to verbalize feelings will improve, Ability to disclose and discuss suicidal ideas, Ability to identify and develop effective coping behaviors will improve, and Compliance with prescribed medications will improve  Medication Management: RN will administer medications as ordered by provider, will assess and evaluate patient's response and provide education to patient for prescribed medication. RN will report any adverse and/or side effects to prescribing provider.  Therapeutic Interventions: 1 on 1 counseling sessions, Psychoeducation, Medication administration, Evaluate responses to treatment, Monitor vital signs and CBGs as ordered, Perform/monitor CIWA, COWS, AIMS and Fall Risk screenings as ordered, Perform wound care treatments as ordered.  Evaluation of Outcomes: Progressing   LCSW Treatment Plan for Primary Diagnosis: Schizoaffective disorder, bipolar type (HCC) Long Term Goal(s): Safe transition to appropriate next level of care at discharge, Engage patient in therapeutic group addressing interpersonal concerns.  Short Term Goals: Engage patient in aftercare planning with referrals and resources, Increase social support, Increase ability to appropriately verbalize feelings, Increase emotional regulation, Facilitate acceptance of mental  health diagnosis and concerns, Facilitate patient progression through stages of change regarding substance use diagnoses and concerns, Identify triggers associated with mental health/substance abuse issues, and Increase skills for wellness and recovery  Therapeutic Interventions: Assess for all discharge needs, 1 to 1 time with Social worker, Explore available resources and support systems, Assess for adequacy in community support network, Educate family and significant other(s) on suicide prevention, Complete Psychosocial Assessment, Interpersonal group therapy.  Evaluation of Outcomes: Progressing   Progress in Treatment: Attending groups: Yes. Participating in groups: Yes. Taking medication as prescribed: Yes. Toleration medication: Yes. Family/Significant other contact made: Yes, individual(s) contacted:  Kannon Baum, son, [phone] Patient understands diagnosis: Yes. Discussing patient identified problems/goals with staff: Yes. Medical problems stabilized or resolved: Yes. Denies suicidal/homicidal ideation: Yes. Issues/concerns per patient self-inventory: No. Other: None   New problem(s) identified: No, Describe:  none Updates 12/12/22: No changes made at this time Updates 12/17/22: No changes made at this time Update 12/24/22 No changes at this time. Update 12/29/2022: No changes at this time. Update 01/03/23: No changes at this time        New Short Term/Long Term Goal(s): Update 6/30: none at this time. Update 09/27/2022:  No changes at this time.  Update 10/02/2022:  No changes at this time. Update 10/07/22: No changes at this time 10/12/22: No changes at this time Update 10/18/22: No changes at this time Update 10/23/22: No changes at this time Update 10/28/22: No changes at this time Update 11/07/22: No changes at this time Update 11/12/22: No changes at this time  Update 11/17/22: None at this time. Update 11/22/22: None at this time. Update 11/27/22 No changes at this time. Update 12/02/22  No changes at this time  Update 12/07/22: None at this time. Updates 12/12/22: No changes made at this time Updates 12/17/22: No changes made at this time Update 12/24/22 No changes at this time. Update 12/29/2022: No changes at this time. Update 01/03/23: No changes at this time          Patient Goals:  Update 6/30: none at this time. Update 09/27/2022:  No changes at this time. Update 10/02/2022:  No changes at this time. Update 10/07/22: No changes at this time 10/12/22: No changes at this time Update 10/18/22: No changes at this time Update 10/23/22: No changes at this time Update 10/28/22: No changes at this time Update 11/07/22: No changes at this time Update 11/12/22: No changes at this time Update 11/17/22: None at this time. Update 11/22/22: None at this time. Update 11/27/22 No changes at this time Update 12/02/22 No changes at this time   Update 12/07/22: None at this time. Updates 12/12/22: No changes made at this time Updates 12/17/22: No changes made at this time Update 12/24/22 No changes at this time. Update 12/29/2022: No changes at this time. Update 01/03/23: No changes at this time        Discharge Plan or Barriers: Update 6/30: APS report has been made and patient is being investigated for guardianship needs.  Remains homeless with limited supports. Update 09/27/2022:  No changes at this time.  Update 10/02/2022:  Patient remains safe on the unit at this time.  Patient remains psychotic at this time.  APS report has been made, however, no follow up from the caseworker on her case.  No safe discharge identified.  CSW has requested that application for Medicaid and disability be completed.   Update 10/07/22: No changes at this time 10/12/22: No changes at this time Update 10/18/22: No changes at this time Update 10/23/22: No changes at this time Update 10/23/22: No changes at this time Update 10/28/22: According to [facility], pt's caseworker at DSS, the petition was filed for guardianship on 10/21/22, now awaiting for the  petition to be approvedUpdate 11/07/22: No changes at this time Update 11/12/22: CSW sent over patients information to [facility] and [facility], awaiting a response. CSW awaiting word from DSS regarding pt's petition and what agency will become her guardian. Update 11/17/22: No changes at this time. Update 11/22/22: CSW has sent patients information to multiple facilities that were suggested by leadership, pt has been denied from the facility or the facility does not respond. CSW continues to send pt's information to nursing homes. Update 11/27/22 CSW contacted DSS to inquire about the status of guardianship. No response at this time Update 12/02/22 Supervisor Loraine Leriche contacted DSS who stated they are not taking pt on for guardianship but that Quincy Carnes will be present tomorrow to do a visit with social worker and pt and that pt's son will be signing documentation for placement for the pt. Update 12/07/22: [facility] DSS has declined to take guardianship. Son, Casimiro Needle is to step into a more present role in deciding placement. A conversation is still needed. Updates 12/12/22: CSW staffed case with leadership and they state that a family meeting must happen with pt's son. CSW contacted pt's financial navigator and her DSS worker so that they may be present on the call and are able to speak to the efforts they have made to secure pt funding and housing. CSW waiting on responses from both, leadership is aware. Updates 12/17/22: CSW awaiting updates from both [facility] DSS and [facility]  financial navigators as to status of pt's applications for Medicaid/[employer] and Disability, respectively. Update 12/24/22 CSW sent email regarding family meeting per request of supervisors. CSW awaiting responses from all parties to schedule family meeting. Update: 12/29/2022 No changes at this time. Update 01/03/23: CSW continues to await responses on the scheduling of the family meeting. CSW received call from  Primitivo Gauze at [employer], CSW sent FL2 to [place] to aide in pt's discharge planning.       Reason for Continuation of Hospitalization: Hallucinations Mania Medication stabilization   Estimated Length of Stay:  Update 6/30: 1-7 days Update 09/27/2022:  TBD Update 10/02/2022:  No changes at this time. UpdaUpdate 10/18/22: No changes at this time te 10/07/22: No changes at this time Update 10/18/22: No changes at this time Update 10/23/22: No changes at this time Update 10/28/22: No changes at this time Update 11/07/22: No changes at this time Update 11/12/22: No changes at this time  Update 11/17/22: No changes at this time. Update 11/22/22: None at this time. Update 11/27/22 No changes at this time. Update 12/02/22 No changes at this time  Update 12/07/22: None at this time. Updates 12/12/22: No changes made at this time Updates 12/17/22: TBD. Update 12/29/2022: No changes at this time. Update 01/03/23: TBD    Last 3 [place] Suicide Severity Risk Score: Flowsheet Row Admission (Current) from 08/22/2022 in [facility] ED from 08/17/2022 in [facility] Department at [facility] from 08/12/2022 in [facility] Department at [facility]  C-SSRS RISK CATEGORY Low Risk No Risk No Risk       Last [facility] 2/9 Scores:     No data to display          Scribe for Treatment Team: Elza Rafter, Theresia Majors 01/03/2023 12:13 PM

## 2023-01-03 NOTE — Plan of Care (Signed)

## 2023-01-03 NOTE — BHH Counselor (Signed)
CSW received call from Primitivo Gauze [phone]), caseworker at DSS.   Shaune Pascal states that she has taken over Ms.Cambell's case at DSS.   Shaune Pascal expresses concern that pt has been in the hospital so long and states if CSW sends FL2 she can assist with placement.   CSW sent FL2 to Camilla at [email or web address].   Shaune Pascal states for pt to be considered for guardianship again there needs to be pscyh testing to show pt lacks capacity. Camilla states pt would need an NMSE.   CSW will inform provider.  Reynaldo Minium, MSW, [place] 01/03/2023 11:02 AM

## 2023-01-03 NOTE — Group Note (Signed)
Date:  01/03/2023 Time:  9:24 PM  Group Topic/Focus:  Making Healthy Choices:   The focus of this group is to help patients identify negative/unhealthy choices they were using prior to admission and identify positive/healthier coping strategies to replace them upon discharge.    Participation Level:  Active  Participation Quality:  Appropriate  Affect:  Appropriate  Cognitive:  Appropriate  Insight: Appropriate  Engagement in Group:  Engaged  Modes of Intervention:  Education  Additional Comments:    [name] 01/03/2023, 9:24 PM

## 2023-01-03 NOTE — Plan of Care (Signed)
D: Pt alert and oriented. Pt denies experiencing any anxiety/depression at this time. Pt reports experiencing 9/10 bilat leg pain at this time, prn medications given. Pt denies experiencing any SI/HI, or AVH at this time.   A: Scheduled medications administered to pt, per MD orders. Support and encouragement provided. Frequent verbal contact made. Routine safety checks conducted q15 minutes.   R: No adverse drug reactions noted. Pt verbally contracts for safety at this time. Pt compliant with medications and treatment plan. Pt interacts well with others on the unit. Pt remains safe at this time. Plan of care ongoing.  Problem: Education: Goal: Knowledge of General Education information will improve Description: Including pain rating scale, medication(s)/side effects and non-pharmacologic comfort measures Outcome: Progressing   Problem: Nutrition: Goal: Adequate nutrition will be maintained Outcome: Progressing

## 2023-01-03 NOTE — Group Note (Signed)
Date:  01/03/2023 Time:  8:45 PM  Group Topic/Focus:  Personal Choices and Values:   The focus of this group is to help patients assess and explore the importance of values in their lives, how their values affect their decisions, how they express their values and what opposes their expression.    Participation Level:  Active  Participation Quality:  Appropriate  Affect:  Appropriate  Cognitive:  Appropriate  Insight: Good  Engagement in Group:  Engaged  Modes of Intervention:  Discussion  Additional Comments:    [name] 01/03/2023, 8:45 PM

## 2023-01-03 NOTE — Group Note (Signed)
Recreation Therapy Group Note   Group Topic:Other  Group Date: 01/03/2023 Start Time: 1400 End Time: 1450 Facilitators: Rosina Lowenstein, LRT, CTRS Location: Courtyard  Group Description: Outdoor Recreation. Patients had the option to play corn hole, ring toss, bowling or listening to music while outside in the courtyard getting fresh air and sunlight. Popular dance hit songs, like the Cha Cha Slide, were then played and patients were encouraged to join in. LRT and patients discussed things that they enjoy doing in their free time outside of the hospital. LRT and techs encouraged patients to drink water after being active and getting their heart rate up.   Goal Area(s) Addressed: Patient will identify leisure interests.  Patient will practice healthy decision making. Patient will engage in recreation activity.   Affect/Mood: Appropriate   Participation Level: Active and Engaged   Participation Quality: Independent   Behavior: Appropriate, Calm, and Cooperative   Speech/Thought Process: Coherent   Insight: Good   Judgement: Good   Modes of Intervention: Activity   Patient Response to Interventions:  Attentive, Engaged, Interested , and Receptive   Education Outcome:  Acknowledges education   Clinical Observations/Individualized Feedback: Betty Henderson was active in their participation of session activities and group discussion. Pt interacted well with LRT and peers duration of session.   Plan: Continue to engage patient in RT group sessions 2-3x/week.   Rosina Lowenstein, LRT, CTRS 01/03/2023 3:12 PM

## 2023-01-03 NOTE — Progress Notes (Signed)
[facility] MD Progress Note  01/03/2023 3:13 PM Betty Henderson  MRN:  [id number] Subjective: Betty Henderson is seen on rounds.  She has been compliant with medication.  She has her moments of being delusional.  DSS is still working with social work for placement.  She denies any suicidal ideation.  She has been in good controls on the unit.  She has been compliant and pleasant and cooperative Principal Problem: Schizoaffective disorder, bipolar type (HCC) Diagnosis: Principal Problem:   Schizoaffective disorder, bipolar type (HCC)  Total Time spent with patient: 15 minutes  Past Psychiatric History: Schizoaffective disorder  Past Medical History:  Past Medical History:  Diagnosis Date   Anemia    Arthritis    Chronic pain    Drug-seeking behavior    Malingering    Osteopetrosis    Psychosis (HCC)    Schizoaffective disorder, bipolar type (HCC)     Past Surgical History:  Procedure Laterality Date   MOUTH SURGERY     TUBAL LIGATION     Family History:  Family History  Family history unknown: Yes   Family Psychiatric  History: Unremarkable Social History:  Social History   Substance and Sexual Activity  Alcohol Use Not Currently   Comment: 1 cocktail 3 weeks ago     Social History   Substance and Sexual Activity  Drug Use Not Currently   Types: Cocaine   Comment: states "it's legal though"    Social History   Socioeconomic History   Marital status: Single    Spouse name: Not on file   Number of children: Not on file   Years of education: Not on file   Highest education level: Not on file  Occupational History   Not on file  Tobacco Use   Smoking status: Every Day    Current packs/day: 0.50    Types: Cigarettes   Smokeless tobacco: Never  Vaping Use   Vaping status: Never Used  Substance and Sexual Activity   Alcohol use: Not Currently    Comment: 1 cocktail 3 weeks ago   Drug use: Not Currently    Types: Cocaine    Comment: states "it's legal though"   Sexual  activity: Never  Other Topics Concern   Not on file  Social History Narrative   Not on file   Social Determinants of Health   Financial Resource Strain: Not on file  Food Insecurity: Food Insecurity Present (08/22/2022)   Hunger Vital Sign    Worried About Running Out of Food in the Last Year: Sometimes true    Ran Out of Food in the Last Year: Sometimes true  Transportation Needs: Patient Unable To Answer (08/22/2022)   PRAPARE - Transportation    Lack of Transportation (Medical): Patient unable to answer    Lack of Transportation (Non-Medical): Patient unable to answer  Recent Concern: Transportation Needs - Unmet Transportation Needs (06/13/2022)   Received from [facility], [facility]   [facility] - Transportation    Lack of Transportation (Medical): Not on file    Lack of Transportation (Non-Medical): Yes  Physical Activity: Not on file  Stress: Not on file  Social Connections: Unknown (08/06/2021)   Received from [facility], [facility]   Social Network    Social Network: Not on file   Additional Social History:                         Sleep: Good  Appetite:  Good  Current Medications:  Current Facility-Administered Medications  Medication Dose Route Frequency Provider Last Rate Last Admin   acetaminophen (TYLENOL) tablet 650 mg  650 mg Oral Q6H PRN [name], DO   650 mg at 01/03/23 1010   alum & mag hydroxide-simeth (MAALOX/MYLANTA) 200-200-20 MG/5ML suspension 30 mL  30 mL Oral Q4H PRN [name], DO   30 mL at 09/20/22 1142   diphenhydrAMINE (BENADRYL) capsule 50 mg  50 mg Oral Q6H PRN [name], DO   50 mg at 09/29/22 2254   Or   diphenhydrAMINE (BENADRYL) injection 50 mg  50 mg Intramuscular Q6H PRN [name], DO       haloperidol (HALDOL) tablet 5 mg  5 mg Oral Q6H PRN [name], DO   5 mg at 09/28/22 2149   Or   haloperidol lactate (HALDOL) injection 5 mg  5 mg Intramuscular  Q6H PRN [name], DO       ibuprofen (ADVIL) tablet 600 mg  600 mg Oral Q6H PRN [name], [name], MD   600 mg at 12/31/22 2106   LORazepam (ATIVAN) tablet 1 mg  1 mg Oral TID PRN [name], DO   1 mg at 09/27/22 0025   magnesium hydroxide (MILK OF MAGNESIA) suspension 30 mL  30 mL Oral Daily PRN [name], DO   30 mL at 11/08/22 1012   traZODone (DESYREL) tablet 50 mg  50 mg Oral QHS PRN [name], DO   50 mg at 01/02/23 2127    Lab Results: No results found for this or any previous visit (from the past 48 hour(s)).  Blood Alcohol level:  Lab Results  Component Value Date   ETH <10 08/17/2022   ETH <10 01/11/2021    Metabolic Disorder Labs: No results found for: "HGBA1C", "MPG" No results found for: "PROLACTIN" No results found for: "CHOL", "TRIG", "HDL", "CHOLHDL", "VLDL", "LDLCALC"  Physical Findings: AIMS:  , ,  ,  ,    CIWA:    COWS:     Musculoskeletal: Strength & Muscle Tone: within normal limits Gait & Station: normal Patient leans: N/A  Psychiatric Specialty Exam:  Presentation  General Appearance:  Casual; Neat  Eye Contact: Fair  Speech: Slow; Slurred  Speech Volume: Decreased  Handedness: Right   Mood and Affect  Mood: Anxious  Affect: Inappropriate   Thought Process  Thought Processes: Disorganized  Descriptions of Associations:Loose  Orientation:Partial  Thought Content:Illogical; Rumination  History of Schizophrenia/Schizoaffective disorder:No data recorded Duration of Psychotic Symptoms:No data recorded Hallucinations:No data recorded Ideas of Reference:Paranoia  Suicidal Thoughts:No data recorded Homicidal Thoughts:No data recorded  Sensorium  Memory: Immediate Fair; Remote Poor  Judgment: Poor  Insight: Poor   Executive Functions  Concentration: Poor  Attention Span: Fair  Recall: [employer] of Knowledge: Fair  Language: Fair   Psychomotor  Activity  Psychomotor Activity:No data recorded  Assets  Assets: Communication Skills   Sleep  Sleep:No data recorded   Physical Exam: Physical Exam Vitals and nursing note reviewed.  Constitutional:      Appearance: Normal appearance. She is normal weight.  Neurological:     General: No focal deficit present.     Mental Status: She is alert and oriented to person, place, and time.  Psychiatric:        Mood and Affect: Mood normal.        Behavior: Behavior normal.    Review of Systems  Constitutional: Negative.   HENT: Negative.  Eyes: Negative.   Respiratory: Negative.    Cardiovascular: Negative.   Gastrointestinal: Negative.   Genitourinary: Negative.   Musculoskeletal: Negative.   Skin: Negative.   Neurological: Negative.   Endo/Heme/Allergies: Negative.   Psychiatric/Behavioral: Negative.     Blood pressure 103/67, pulse 69, temperature 97.8 F (36.6 C), resp. rate 15, height 5\' 6"  (1.676 m), weight (P) 82.8 kg, SpO2 100%. Body mass index is 29.46 kg/m (pended).   Treatment Plan Summary: Daily contact with patient to assess and evaluate symptoms and progress in treatment, Medication management, and Plan continue current medication.  [name], DO 01/03/2023, 3:13 PM

## 2023-01-03 NOTE — [place] FL2 (Signed)
[place] MEDICAID FL2 LEVEL OF CARE FORM     IDENTIFICATION  Patient Name: Betty Henderson Birthdate: 08/26/1960 Sex: female Admission Date (Current Location): 08/22/2022  [facility] and [place] Number:  Producer, television/film/video and Address:  [facility], [address], [place], [place] [address]      Provider Number: [id number]  Attending Physician Name and Address:  [name],*  Relative Name and Phone Number:  [name], son, [phone]    Current Level of Care: Hospital Recommended Level of Care: Assisted Living Facility, Family Care Home Prior Approval Number:    Date Approved/Denied:   PASRR Number:    Discharge Plan: Other (Comment) (ALF or Group Home)    Current Diagnoses: Patient Active Problem List   Diagnosis Date Noted   Schizoaffective disorder, bipolar type (HCC) 08/19/2022   Midline low back pain without sciatica 06/14/2022   Malingering 05/31/2022   Neck pain 05/08/2022   Non compliance w medication regimen 02/07/2020   Mild cognitive impairment 10/12/2018   Tobacco use disorder 09/14/2018   Homeless single person 08/22/2017   Arthritis 05/04/2016   Low vitamin B12 level 05/04/2016   Closed avulsion fracture of distal end of left fibula 11/18/2014   Sprain of left ankle 11/18/2014   Acute exacerbation of chronic paranoid schizophrenia (HCC) 09/03/2014    Orientation RESPIRATION BLADDER Height & Weight     Self  Normal Continent Weight: (P) 182 lb 8 oz (82.8 kg) Height:  5\' 6"  (167.6 cm)  BEHAVIORAL SYMPTOMS/MOOD NEUROLOGICAL BOWEL NUTRITION STATUS  Wanderer   Continent    AMBULATORY STATUS COMMUNICATION OF NEEDS Skin   Independent Verbally Normal                       Personal Care Assistance Level of Assistance              Functional Limitations Info  Speech     Speech Info: Adequate    SPECIAL CARE FACTORS FREQUENCY                       Contractures Contractures  Info: Not present    Additional Factors Info  Psychotropic     Psychotropic Info: Invega Sustenna 156 mg, Invega 24 hour tablet 3 mg         Current Medications (01/03/2023):  This is the current hospital active medication list Current Facility-Administered Medications  Medication Dose Route Frequency Provider Last Rate Last Admin   acetaminophen (TYLENOL) tablet 650 mg  650 mg Oral Q6H PRN [name], DO   650 mg at 01/03/23 1010   alum & mag hydroxide-simeth (MAALOX/MYLANTA) 200-200-20 MG/5ML suspension 30 mL  30 mL Oral Q4H PRN [name], DO   30 mL at 09/20/22 1142   diphenhydrAMINE (BENADRYL) capsule 50 mg  50 mg Oral Q6H PRN [name], DO   50 mg at 09/29/22 2254   Or   diphenhydrAMINE (BENADRYL) injection 50 mg  50 mg Intramuscular Q6H PRN [name], DO       haloperidol (HALDOL) tablet 5 mg  5 mg Oral Q6H PRN [name], DO   5 mg at 09/28/22 2149   Or   haloperidol lactate (HALDOL) injection 5 mg  5 mg Intramuscular Q6H PRN [name], DO       ibuprofen (ADVIL) tablet 600 mg  600 mg Oral Q6H PRN [name], [name], MD   600  mg at 12/31/22 2106   LORazepam (ATIVAN) tablet 1 mg  1 mg Oral TID PRN [name], DO   1 mg at 09/27/22 0025   magnesium hydroxide (MILK OF MAGNESIA) suspension 30 mL  30 mL Oral Daily PRN [name], DO   30 mL at 11/08/22 1012   traZODone (DESYREL) tablet 50 mg  50 mg Oral QHS PRN [name], DO   50 mg at 01/02/23 2127     Discharge Medications: Please see discharge summary for a list of discharge medications.  Relevant Imaging Results:  Relevant Lab Results:   Additional Information Pt is currently awaiting guardianship from the state, her petition has been filed. She should also be receiving Social Security soon as well.  Elza Rafter, [place]

## 2023-01-03 NOTE — Progress Notes (Signed)
D- Patient alert and oriented. Pt received in bed with eyes open. RR even and unlabored. States she's ok. Pt is awaiting placement. Denies anxiety and depression. Denies SI, HI, AVH, and pain.  A- PRN given for insomnia. Tol well.  Support and encouragement provided.  Routine safety checks conducted every 15 minutes.  Patient informed to notify staff with problems or concerns. R- No adverse drug reactions noted. Patient contracts for safety at this time. Patient compliant with medications and treatment plan. Patient receptive, calm, and cooperative. Did not attend group. Pt remain in bed throughout the shift. No behavior issues noted. Patient remains safe at this time.

## 2023-01-03 NOTE — Group Note (Signed)
Date:  01/03/2023 Time:  11:09 AM  Group Topic/Focus:  Recovery Goals:   The focus of this group is to identify appropriate goals for recovery and establish a plan to achieve them.    Participation Level:  Active  Participation Quality:  Appropriate  Affect:  Appropriate  Cognitive:  Appropriate  Insight: Good  Engagement in Group:  Engaged  Modes of Intervention:  Education  Additional Comments:    Ardelle Anton 01/03/2023, 11:09 AM

## 2023-01-04 DIAGNOSIS — F250 Schizoaffective disorder, bipolar type: Secondary | ICD-10-CM | POA: Diagnosis not present

## 2023-01-04 NOTE — Plan of Care (Signed)
?  Problem: Nutrition: ?Goal: Adequate nutrition will be maintained ?Outcome: Progressing ?  ?Problem: Coping: ?Goal: Level of anxiety will decrease ?Outcome: Progressing ?  ?Problem: Elimination: ?Goal: Will not experience complications related to bowel motility ?Outcome: Progressing ?Goal: Will not experience complications related to urinary retention ?Outcome: Progressing ?  ?Problem: Pain Managment: ?Goal: General experience of comfort will improve ?Outcome: Progressing ?  ?Problem: Safety: ?Goal: Ability to remain free from injury will improve ?Outcome: Progressing ?  ?

## 2023-01-04 NOTE — Progress Notes (Signed)
Pt is alert and oriented. Incont of urine. Showered. Complete bedding changed  by staff. No c/o pain/discomfort noted. Plan of care continued.

## 2023-01-04 NOTE — Progress Notes (Signed)
   01/04/23 1610  15 Minute Checks  Location Bedroom  Visual Appearance Calm  Behavior Sleeping  Sleep (Behavioral Health Patients Only)  Calculate sleep? (Click Yes once per 24 hr at 0600 safety check) Yes  Documented sleep last 24 hours 9.75

## 2023-01-04 NOTE — Group Note (Signed)
Date:  01/04/2023 Time:  9:21 PM  Group Topic/Focus:  Building Self Esteem:   The Focus of this group is helping patients become aware of the effects of self-esteem on their lives, the things they and others do that enhance or undermine their self-esteem, seeing the relationship between their level of self-esteem and the choices they make and learning ways to enhance self-esteem. Crisis Planning:   The purpose of this group is to help patients create a crisis plan for use upon discharge or in the future, as needed.    Participation Level:  Did Not Attend   Additional Comments:    Betty Henderson 01/04/2023, 9:21 PM

## 2023-01-04 NOTE — Group Note (Signed)
[facility] LCSW Group Therapy Note    Group Date: 01/04/2023 Start Time: 1400 End Time: 1430  Type of Therapy and Topic:  Group Therapy:  Overcoming Obstacles  Participation Level:  [facility] PARTICIPATION LEVEL: Active  Mood:  Description of Group:   In this group patients will be encouraged to explore what they see as obstacles to their own wellness and recovery. They will be guided to discuss their thoughts, feelings, and behaviors related to these obstacles. The group will process together ways to cope with barriers, with attention given to specific choices patients can make. Each patient will be challenged to identify changes they are motivated to make in order to overcome their obstacles. This group will be process-oriented, with patients participating in exploration of their own experiences as well as giving and receiving support and challenge from other group members.  Therapeutic Goals: 1. Patient will identify personal and current obstacles as they relate to admission. 2. Patient will identify barriers that currently interfere with their wellness or overcoming obstacles.  3. Patient will identify feelings, thought process and behaviors related to these barriers. 4. Patient will identify two changes they are willing to make to overcome these obstacles:    Summary of Patient Progress   Pt was able to identify coping skills and was able to identify one song that made her feel hopeful about overcoming obstacles    Therapeutic Modalities:   Cognitive Behavioral Therapy Solution Focused Therapy Motivational Interviewing Relapse Prevention Therapy   Elza Rafter, LCSWA

## 2023-01-04 NOTE — Plan of Care (Signed)

## 2023-01-04 NOTE — Plan of Care (Signed)
D: Pt alert and oriented. Pt denies experiencing any anxiety/depression at this time. Pt denies experiencing any pain at this time. Pt denies experiencing any SI/HI, or AVH at this time.   A: Scheduled medications administered to pt, per MD orders. Support and encouragement provided. Frequent verbal contact made. Routine safety checks conducted q15 minutes.   R: No adverse drug reactions noted. Pt verbally contracts for safety at this time. Pt compliant with medications and treatment plan. Pt interacts well with others on the unit. Pt remains safe at this time. Plan of care ongoing.  Problem: Education: Goal: Knowledge of General Education information will improve Description: Including pain rating scale, medication(s)/side effects and non-pharmacologic comfort measures Outcome: Progressing   Problem: Coping: Goal: Level of anxiety will decrease Outcome: Progressing

## 2023-01-04 NOTE — Progress Notes (Signed)
D- Patient alert and oriented. Denies anxiety and depression. Denies SI, HI, AVH, and pain. Quotes.  A- PRN given for insomnia. Support and encouragement provided.  Routine safety checks conducted every 15 minutes.  Patient informed to notify staff with problems or concerns. R- No adverse drug reactions noted. Patient contracts for safety at this time. Patient compliant with medications and treatment plan. Patient receptive, calm, and cooperative. Patient interacts well with others on the unit.  Patient remains safe at this time.

## 2023-01-04 NOTE — Progress Notes (Signed)
[facility] MD Progress Note  01/04/2023 12:52 PM Betty Henderson  MRN:  [id number] Subjective: [name] is seen on rounds.  No changes.  Nurses report no issues. Principal Problem: Schizoaffective disorder, bipolar type (HCC) Diagnosis: Principal Problem:   Schizoaffective disorder, bipolar type (HCC)  Total Time spent with patient: 15 minutes  Past Psychiatric History: Schizoaffective disorder  Past Medical History:  Past Medical History:  Diagnosis Date   Anemia    Arthritis    Chronic pain    Drug-seeking behavior    Malingering    Osteopetrosis    Psychosis (HCC)    Schizoaffective disorder, bipolar type (HCC)     Past Surgical History:  Procedure Laterality Date   MOUTH SURGERY     TUBAL LIGATION     Family History:  Family History  Family history unknown: Yes   Family Psychiatric  History: Unremarkable Social History:  Social History   Substance and Sexual Activity  Alcohol Use Not Currently   Comment: 1 cocktail 3 weeks ago     Social History   Substance and Sexual Activity  Drug Use Not Currently   Types: Cocaine   Comment: states "it's legal though"    Social History   Socioeconomic History   Marital status: Single    Spouse name: Not on file   Number of children: Not on file   Years of education: Not on file   Highest education level: Not on file  Occupational History   Not on file  Tobacco Use   Smoking status: Every Day    Current packs/day: 0.50    Types: Cigarettes   Smokeless tobacco: Never  Vaping Use   Vaping status: Never Used  Substance and Sexual Activity   Alcohol use: Not Currently    Comment: 1 cocktail 3 weeks ago   Drug use: Not Currently    Types: Cocaine    Comment: states "it's legal though"   Sexual activity: Never  Other Topics Concern   Not on file  Social History Narrative   Not on file   Social Determinants of Health   Financial Resource Strain: Not on file  Food Insecurity: Food Insecurity Present (08/22/2022)    Hunger Vital Sign    Worried About Running Out of Food in the Last Year: Sometimes true    Ran Out of Food in the Last Year: Sometimes true  Transportation Needs: Patient Unable To Answer (08/22/2022)   PRAPARE - Transportation    Lack of Transportation (Medical): Patient unable to answer    Lack of Transportation (Non-Medical): Patient unable to answer  Recent Concern: Transportation Needs - Unmet Transportation Needs (06/13/2022)   [facility], [facility]   [facility] - Transportation    Lack of Transportation (Medical): Not on file    Lack of Transportation (Non-Medical): Yes  Physical Activity: Not on file  Stress: Not on file  Social Connections: Unknown (08/06/2021)   Received from [facility], [facility]   Social Network    Social Network: Not on file   Additional Social History:                         Sleep: Good  Appetite:  Good  Current Medications: Current Facility-Administered Medications  Medication Dose Route Frequency Provider Last Rate Last Admin   acetaminophen (TYLENOL) tablet 650 mg  650 mg Oral Q6H PRN [name], DO   650 mg at 01/03/23 1010   alum & mag  hydroxide-simeth (MAALOX/MYLANTA) 200-200-20 MG/5ML suspension 30 mL  30 mL Oral Q4H PRN [name], DO   30 mL at 09/20/22 1142   diphenhydrAMINE (BENADRYL) capsule 50 mg  50 mg Oral Q6H PRN [name], DO   50 mg at 09/29/22 2254   Or   diphenhydrAMINE (BENADRYL) injection 50 mg  50 mg Intramuscular Q6H PRN [name], DO       haloperidol (HALDOL) tablet 5 mg  5 mg Oral Q6H PRN [name], DO   5 mg at 09/28/22 2149   Or   haloperidol lactate (HALDOL) injection 5 mg  5 mg Intramuscular Q6H PRN [name], DO       ibuprofen (ADVIL) tablet 600 mg  600 mg Oral Q6H PRN [name], [name], MD   600 mg at 12/31/22 2106   LORazepam (ATIVAN) tablet 1 mg  1 mg Oral TID PRN [name], DO   1 mg  at 09/27/22 0025   magnesium hydroxide (MILK OF MAGNESIA) suspension 30 mL  30 mL Oral Daily PRN [name], DO   30 mL at 11/08/22 1012   traZODone (DESYREL) tablet 50 mg  50 mg Oral QHS PRN [name], DO   50 mg at 01/03/23 2123    Lab Results: No results found for this or any previous visit (from the past 48 hour(s)).  Blood Alcohol level:  Lab Results  Component Value Date   ETH <10 08/17/2022   ETH <10 01/11/2021    Metabolic Disorder Labs: No results found for: "HGBA1C", "MPG" No results found for: "PROLACTIN" No results found for: "CHOL", "TRIG", "HDL", "CHOLHDL", "VLDL", "LDLCALC"  Physical Findings: AIMS:  , ,  ,  ,    CIWA:    COWS:     Musculoskeletal: Strength & Muscle Tone: within normal limits Gait & Station: normal Patient leans: N/A  Psychiatric Specialty Exam:  Presentation  General Appearance:  Casual; Neat  Eye Contact: Fair  Speech: Slow; Slurred  Speech Volume: Decreased  Handedness: Right   Mood and Affect  Mood: Anxious  Affect: Inappropriate   Thought Process  Thought Processes: Disorganized  Descriptions of Associations:Loose  Orientation:Partial  Thought Content:Illogical; Rumination  History of Schizophrenia/Schizoaffective disorder:No data recorded Duration of Psychotic Symptoms:No data recorded Hallucinations:No data recorded Ideas of Reference:Paranoia  Suicidal Thoughts:No data recorded Homicidal Thoughts:No data recorded  Sensorium  Memory: Immediate Fair; Remote Poor  Judgment: Poor  Insight: Poor   Executive Functions  Concentration: Poor  Attention Span: Fair  Recall: [employer] of Knowledge: Fair  Language: Fair   Psychomotor Activity  Psychomotor Activity:No data recorded  Assets  Assets: Communication Skills   Sleep  Sleep:No data recorded   Blood pressure 106/67, pulse 80, temperature (!) 97.2 F (36.2 C), resp. rate 15, height 5\' 6"   (1.676 m), weight (P) 82.8 kg, SpO2 97%. Body mass index is 29.46 kg/m (pended).   Treatment Plan Summary: Daily contact with patient to assess and evaluate symptoms and progress in treatment, Medication management, and Plan to continue current medication.  [name], DO 01/04/2023, 12:52 PM

## 2023-01-05 DIAGNOSIS — F250 Schizoaffective disorder, bipolar type: Secondary | ICD-10-CM | POA: Diagnosis not present

## 2023-01-05 NOTE — Progress Notes (Signed)
   01/05/23 2115  Psych Admission Type (Psych Patients Only)  Admission Status Voluntary  Psychosocial Assessment  Patient Complaints None  Eye Contact Fair  Facial Expression Flat  Affect Appropriate to circumstance  Speech Logical/coherent  Interaction Minimal  Motor Activity Shuffling  Appearance/Hygiene Unremarkable  Behavior Characteristics Cooperative;Appropriate to situation  Mood Pleasant;Sad  Thought Process  Coherency WDL  Content WDL  Delusions None reported or observed  Perception WDL  Hallucination None reported or observed  Judgment Limited  Confusion None  Danger to Self  Current suicidal ideation? Denies  Agreement Not to Harm Self Yes  Description of Agreement verbal  Danger to Others  Danger to Others None reported or observed   Progress note   D: Pt seen in dayroom for group. Pt denies SI, HI, AVH. Pt rates pain  8/10 as chronic pain bilateral knees. Pt rates anxiety  0/10 and depression  0/10. Pt said she is doing well but ready to be discharged. When asked about discharge, pt stated, "My son and his wife are going to take me in. They are going to speak with my son tomorrow. Hopefully, staying with him will help me get my independence back."  No other concerns noted at this time.  A: Pt provided support and encouragement. Pt given scheduled medication as prescribed. PRNs as appropriate. Q15 min checks for safety.   R: Pt safe on the unit. Will continue to monitor.

## 2023-01-05 NOTE — Progress Notes (Signed)
Patient denies SI, HI, and AVH. She denies pain and other physical problems. She attended group and came out of her room for breakfast.

## 2023-01-05 NOTE — Progress Notes (Addendum)
D- Patient alert and oriented. Denies anxiety and depression. Denies SI, HI, AVH, and pain.  A- PRNs given for insomnia. Support and encouragement provided.  Routine safety checks conducted every 15 minutes.  Patient informed to notify staff with problems or concerns. R- No adverse drug reactions noted. Patient contracts for safety at this time. Patient compliant with medications and treatment plan. Patient receptive, calm, and cooperative. Patient interacts well with others on the unit.  Patient remains safe at this time.

## 2023-01-05 NOTE — Progress Notes (Signed)
   01/05/23 0618  15 Minute Checks  Location Bedroom  Visual Appearance Calm  Behavior Sleeping  Sleep (Behavioral Health Patients Only)  Calculate sleep? (Click Yes once per 24 hr at 0600 safety check) Yes  Documented sleep last 24 hours 12.25

## 2023-01-05 NOTE — Progress Notes (Signed)
[facility] MD Progress Note  01/05/2023 1:35 PM Betty Henderson  MRN:  [id number] Subjective: Betty Henderson is seen on rounds.  She has no complaints.  She receives [place] 156 on the 25th of each month.  Nurses report no problems and no issues. Principal Problem: Schizoaffective disorder, bipolar type (HCC) Diagnosis: Principal Problem:   Schizoaffective disorder, bipolar type (HCC)  Total Time spent with patient: 15 minutes  Past Psychiatric History: Schizoaffective disorder  Past Medical History:  Past Medical History:  Diagnosis Date   Anemia    Arthritis    Chronic pain    Drug-seeking behavior    Malingering    Osteopetrosis    Psychosis (HCC)    Schizoaffective disorder, bipolar type (HCC)     Past Surgical History:  Procedure Laterality Date   MOUTH SURGERY     TUBAL LIGATION     Family History:  Family History  Family history unknown: Yes   Family Psychiatric  History: Unremarkable Social History:  Social History   Substance and Sexual Activity  Alcohol Use Not Currently   Comment: 1 cocktail 3 weeks ago     Social History   Substance and Sexual Activity  Drug Use Not Currently   Types: Cocaine   Comment: states "it's legal though"    Social History   Socioeconomic History   Marital status: Single    Spouse name: Not on file   Number of children: Not on file   Years of education: Not on file   Highest education level: Not on file  Occupational History   Not on file  Tobacco Use   Smoking status: Every Day    Current packs/day: 0.50    Types: Cigarettes   Smokeless tobacco: Never  Vaping Use   Vaping status: Never Used  Substance and Sexual Activity   Alcohol use: Not Currently    Comment: 1 cocktail 3 weeks ago   Drug use: Not Currently    Types: Cocaine    Comment: states "it's legal though"   Sexual activity: Never  Other Topics Concern   Not on file  Social History Narrative   Not on file   Social Determinants of Health    Financial Resource Strain: Not on file  Food Insecurity: Food Insecurity Present (08/22/2022)   Hunger Vital Sign    Worried About Running Out of Food in the Last Year: Sometimes true    Ran Out of Food in the Last Year: Sometimes true  Transportation Needs: Patient Unable To Answer (08/22/2022)   PRAPARE - Transportation    Lack of Transportation (Medical): Patient unable to answer    Lack of Transportation (Non-Medical): Patient unable to answer  Recent Concern: Transportation Needs - Unmet Transportation Needs (06/13/2022)   Received from [facility], [facility]   [facility] - Transportation    Lack of Transportation (Medical): Not on file    Lack of Transportation (Non-Medical): Yes  Physical Activity: Not on file  Stress: Not on file  Social Connections: Unknown (08/06/2021)   Received from [facility], [facility]   Social Network    Social Network: Not on file   Additional Social History:                         Sleep: Good  Appetite:  Good  Current Medications: Current Facility-Administered Medications  Medication Dose Route Frequency Provider Last Rate Last Admin   acetaminophen (TYLENOL) tablet 650 mg  650 mg Oral Q6H  PRN [name], DO   650 mg at 01/03/23 1010   alum & mag hydroxide-simeth (MAALOX/MYLANTA) 200-200-20 MG/5ML suspension 30 mL  30 mL Oral Q4H PRN [name], DO   30 mL at 09/20/22 1142   diphenhydrAMINE (BENADRYL) capsule 50 mg  50 mg Oral Q6H PRN [name], DO   50 mg at 09/29/22 2254   Or   diphenhydrAMINE (BENADRYL) injection 50 mg  50 mg Intramuscular Q6H PRN [name], DO       haloperidol (HALDOL) tablet 5 mg  5 mg Oral Q6H PRN [name], DO   5 mg at 09/28/22 2149   Or   haloperidol lactate (HALDOL) injection 5 mg  5 mg Intramuscular Q6H PRN [name], DO       ibuprofen (ADVIL) tablet 600 mg  600 mg Oral Q6H PRN [name], [name], MD   600 mg at  12/31/22 2106   LORazepam (ATIVAN) tablet 1 mg  1 mg Oral TID PRN [name], DO   1 mg at 09/27/22 0025   magnesium hydroxide (MILK OF MAGNESIA) suspension 30 mL  30 mL Oral Daily PRN [name], DO   30 mL at 11/08/22 1012   traZODone (DESYREL) tablet 50 mg  50 mg Oral QHS PRN [name], DO   50 mg at 01/04/23 2131    Lab Results: No results found for this or any previous visit (from the past 48 hour(s)).  Blood Alcohol level:  Lab Results  Component Value Date   ETH <10 08/17/2022   ETH <10 01/11/2021    Metabolic Disorder Labs: No results found for: "HGBA1C", "MPG" No results found for: "PROLACTIN" No results found for: "CHOL", "TRIG", "HDL", "CHOLHDL", "VLDL", "LDLCALC"  Physical Findings: AIMS:  , ,  ,  ,    CIWA:    COWS:     Musculoskeletal: Strength & Muscle Tone: within normal limits Gait & Station: normal Patient leans: Backward  Psychiatric Specialty Exam:  Presentation  General Appearance:  Casual; Neat  Eye Contact: Fair  Speech: Slow; Slurred  Speech Volume: Decreased  Handedness: Right   Mood and Affect  Mood: Anxious  Affect: Inappropriate   Thought Process  Thought Processes: Disorganized  Descriptions of Associations:Loose  Orientation:Partial  Thought Content:Illogical; Rumination  History of Schizophrenia/Schizoaffective disorder:No data recorded Duration of Psychotic Symptoms:No data recorded Hallucinations:No data recorded Ideas of Reference:Paranoia  Suicidal Thoughts:No data recorded Homicidal Thoughts:No data recorded  Sensorium  Memory: Immediate Fair; Remote Poor  Judgment: Poor  Insight: Poor   Executive Functions  Concentration: Poor  Attention Span: Fair  Recall: [employer] of Knowledge: Fair  Language: Fair   Psychomotor Activity  Psychomotor Activity:No data recorded  Assets  Assets: Communication Skills   Sleep  Sleep:No data  recorded    Blood pressure (!) 104/50, pulse 71, temperature (!) 97.4 F (36.3 C), resp. rate 16, height 5\' 6"  (1.676 m), weight (P) 82.8 kg, SpO2 97%. Body mass index is 29.46 kg/m (pended).   Treatment Plan Summary: Daily contact with patient to assess and evaluate symptoms and progress in treatment, Medication management, and Plan continue current medications.  Danalee Flath Tresea Mall, DO 01/05/2023, 1:35 PM

## 2023-01-06 DIAGNOSIS — F250 Schizoaffective disorder, bipolar type: Secondary | ICD-10-CM | POA: Diagnosis not present

## 2023-01-06 NOTE — Group Note (Signed)
Date:  01/06/2023 Time:  1:07 AM  Group Topic/Focus:  Making Healthy Choices:   The focus of this group is to help patients identify negative/unhealthy choices they were using prior to admission and identify positive/healthier coping strategies to replace them upon discharge.    Participation Level:  Active  Participation Quality:  Appropriate  Affect:  Appropriate  Cognitive:  Alert  Insight: Improving  Engagement in Group:  Improving  Modes of Intervention:  Discussion  Additional Comments:    [name] 01/06/2023, 1:07 AM

## 2023-01-06 NOTE — BHH Counselor (Signed)
CSW contacted Sharlee Blew [phone]) at [facility] to inquire about status of pt's placement.   CSW unable to reach, left HIPAA compliant VM.   Reynaldo Minium, MSW, [place] 01/06/2023 1:41 PM

## 2023-01-06 NOTE — Progress Notes (Signed)
   01/06/23 0557  15 Minute Checks  Location Bedroom  Visual Appearance Calm  Behavior Sleeping  Sleep (Behavioral Health Patients Only)  Calculate sleep? (Click Yes once per 24 hr at 0600 safety check) Yes  Documented sleep last 24 hours 17.75

## 2023-01-06 NOTE — Group Note (Signed)
Recreation Therapy Group Note   Group Topic:Coping Skills  Group Date: 01/06/2023 Start Time: 1415 End Time: 1500 Facilitators: Rosina Lowenstein, LRT, CTRS Location: Courtyard  Group Description: Music Reminisce. LRT encouraged patients to think of their favorite song(s) that reminded them of a positive memory or time in their life. LRT encouraged patient to talk about that memory aloud to the group. LRT played the song through a speaker for all to hear. LRT and patients discussed how thinking of a positive memory or time in their life can be used as a coping skill in everyday life post discharge.    Goal Area(s) Addressed: Patient will increase verbal communication by conversing with peers. Patient will contribute to group discussion with minimal prompting. Patient will reminisce a positive memory or moment in their life.    Affect/Mood: N/A   Participation Level: Did not attend    Clinical Observations/Individualized Feedback: Marysol did not attend group.  Plan: Continue to engage patient in RT group sessions 2-3x/week.   Rosina Lowenstein, LRT, CTRS 01/06/2023 3:04 PM

## 2023-01-06 NOTE — Progress Notes (Signed)
Patient is a voluntary admission to [facility] for SAD - Bipolar awaiting placement.  Patient is calm, cooperative, friendly with staff and peers. Denies SI, HI, AVH, Anxiety and Depression. Performs her own ADL's, urgency with urination so wears a depends for occasional incontinence. Will continue to monitor.

## 2023-01-06 NOTE — Plan of Care (Signed)
Problem: Health Behavior/Discharge Planning: Goal: Ability to manage health-related needs will improve Outcome: Progressing   Problem: Clinical Measurements: Goal: Ability to maintain clinical measurements within normal limits will improve Outcome: Progressing   Problem: Nutrition: Goal: Adequate nutrition will be maintained Outcome: Progressing   Problem: Coping: Goal: Level of anxiety will decrease Outcome: Progressing   Problem: Pain Managment: Goal: General experience of comfort will improve Outcome: Progressing   Problem: Safety: Goal: Ability to remain free from injury will improve Outcome: Progressing

## 2023-01-06 NOTE — Progress Notes (Signed)
[facility] MD Progress Note  01/06/2023 12:05 PM Betty Henderson  MRN:  [id number] Subjective: Betty Henderson is seen on rounds.  Social work continues to work with DSS on placement issues.  [facility]SS currently has guardianship would like the family to take it over but I do not think that there is any progress with that.  Betty Henderson's been pleasant and cooperative on the unit compliant with medications and she receives [place] 156 on the 25th of each month.  No side effects. Principal Problem: Schizoaffective disorder, bipolar type (HCC) Diagnosis: Principal Problem:   Schizoaffective disorder, bipolar type (HCC)  Total Time spent with patient: 15 minutes  Past Psychiatric History: Schizoaffective disorder  Past Medical History:  Past Medical History:  Diagnosis Date   Anemia    Arthritis    Chronic pain    Drug-seeking behavior    Malingering    Osteopetrosis    Psychosis (HCC)    Schizoaffective disorder, bipolar type (HCC)     Past Surgical History:  Procedure Laterality Date   MOUTH SURGERY     TUBAL LIGATION     Family History:  Family History  Family history unknown: Yes   Family Psychiatric  History: Unremarkable Social History:  Social History   Substance and Sexual Activity  Alcohol Use Not Currently   Comment: 1 cocktail 3 weeks ago     Social History   Substance and Sexual Activity  Drug Use Not Currently   Types: Cocaine   Comment: states "it's legal though"    Social History   Socioeconomic History   Marital status: Single    Spouse name: Not on file   Number of children: Not on file   Years of education: Not on file   Highest education level: Not on file  Occupational History   Not on file  Tobacco Use   Smoking status: Every Day    Current packs/day: 0.50    Types: Cigarettes   Smokeless tobacco: Never  Vaping Use   Vaping status: Never Used  Substance and Sexual Activity   Alcohol use: Not Currently    Comment: 1 cocktail 3 weeks ago   Drug use:  Not Currently    Types: Cocaine    Comment: states "it's legal though"   Sexual activity: Never  Other Topics Concern   Not on file  Social History Narrative   Not on file   Social Determinants of Health   Financial Resource Strain: Not on file  Food Insecurity: Food Insecurity Present (08/22/2022)   Hunger Vital Sign    Worried About Running Out of Food in the Last Year: Sometimes true    Ran Out of Food in the Last Year: Sometimes true  Transportation Needs: Patient Unable To Answer (08/22/2022)   PRAPARE - Transportation    Lack of Transportation (Medical): Patient unable to answer    Lack of Transportation (Non-Medical): Patient unable to answer  Recent Concern: Transportation Needs - Unmet Transportation Needs (06/13/2022)   Received from [facility], [facility]   [facility] - Transportation    Lack of Transportation (Medical): Not on file    Lack of Transportation (Non-Medical): Yes  Physical Activity: Not on file  Stress: Not on file  Social Connections: Unknown (08/06/2021)   Received from [facility], [facility]   Social Network    Social Network: Not on file   Additional Social History:  Sleep: Good  Appetite:  Good  Current Medications: Current Facility-Administered Medications  Medication Dose Route Frequency Provider Last Rate Last Admin   acetaminophen (TYLENOL) tablet 650 mg  650 mg Oral Q6H PRN [name], DO   650 mg at 01/05/23 2120   alum & mag hydroxide-simeth (MAALOX/MYLANTA) 200-200-20 MG/5ML suspension 30 mL  30 mL Oral Q4H PRN [name], DO   30 mL at 09/20/22 1142   diphenhydrAMINE (BENADRYL) capsule 50 mg  50 mg Oral Q6H PRN [name], DO   50 mg at 09/29/22 2254   Or   diphenhydrAMINE (BENADRYL) injection 50 mg  50 mg Intramuscular Q6H PRN [name], DO       haloperidol (HALDOL) tablet 5 mg  5 mg Oral Q6H PRN [name], DO   5 mg at  09/28/22 2149   Or   haloperidol lactate (HALDOL) injection 5 mg  5 mg Intramuscular Q6H PRN [name], DO       ibuprofen (ADVIL) tablet 600 mg  600 mg Oral Q6H PRN [name], [name], MD   600 mg at 12/31/22 2106   LORazepam (ATIVAN) tablet 1 mg  1 mg Oral TID PRN [name], DO   1 mg at 09/27/22 0025   magnesium hydroxide (MILK OF MAGNESIA) suspension 30 mL  30 mL Oral Daily PRN [name], DO   30 mL at 11/08/22 1012   traZODone (DESYREL) tablet 50 mg  50 mg Oral QHS PRN [name], DO   50 mg at 01/05/23 2114    Lab Results: No results found for this or any previous visit (from the past 48 hour(s)).  Blood Alcohol level:  Lab Results  Component Value Date   ETH <10 08/17/2022   ETH <10 01/11/2021    Metabolic Disorder Labs: No results found for: "HGBA1C", "MPG" No results found for: "PROLACTIN" No results found for: "CHOL", "TRIG", "HDL", "CHOLHDL", "VLDL", "LDLCALC"  Physical Findings: AIMS:  , ,  ,  ,    CIWA:    COWS:     Musculoskeletal: Strength & Muscle Tone: within normal limits Gait & Station: normal Patient leans: N/A  Psychiatric Specialty Exam:  Presentation  General Appearance:  Casual; Neat  Eye Contact: Fair  Speech: Slow; Slurred  Speech Volume: Decreased  Handedness: Right   Mood and Affect  Mood: Anxious  Affect: Inappropriate   Thought Process  Thought Processes: Disorganized  Descriptions of Associations:Loose  Orientation:Partial  Thought Content:Illogical; Rumination  History of Schizophrenia/Schizoaffective disorder:No data recorded Duration of Psychotic Symptoms:No data recorded Hallucinations:No data recorded Ideas of Reference:Paranoia  Suicidal Thoughts:No data recorded Homicidal Thoughts:No data recorded  Sensorium  Memory: Immediate Fair; Remote Poor  Judgment: Poor  Insight: Poor   Executive Functions  Concentration: Poor  Attention  Span: Fair  Recall: [employer] of Knowledge: Fair  Language: Fair   Psychomotor Activity  Psychomotor Activity:No data recorded  Assets  Assets: Communication Skills   Sleep  Sleep:No data recorded   Physical Exam: Physical Exam Vitals and nursing note reviewed.  Constitutional:      Appearance: Normal appearance. She is normal weight.  Neurological:     General: No focal deficit present.     Mental Status: She is alert and oriented to person, place, and time.  Psychiatric:        Attention and Perception: Attention and perception normal.        Mood and Affect: Mood and  affect normal.        Speech: Speech is tangential.        Behavior: Behavior normal. Behavior is cooperative.        Thought Content: Thought content is paranoid and delusional.        Cognition and Memory: Cognition and memory normal.        Judgment: Judgment normal.    Review of Systems  Constitutional: Negative.   HENT: Negative.    Eyes: Negative.   Respiratory: Negative.    Cardiovascular: Negative.   Gastrointestinal: Negative.   Genitourinary: Negative.   Musculoskeletal: Negative.   Skin: Negative.   Neurological: Negative.   Endo/Heme/Allergies: Negative.   Psychiatric/Behavioral: Negative.     Blood pressure 112/67, pulse 77, temperature (!) 97.3 F (36.3 C), resp. rate 16, height 5\' 6"  (1.676 m), weight (P) 82.8 kg, SpO2 94%. Body mass index is 29.46 kg/m (pended).   Treatment Plan Summary: Daily contact with patient to assess and evaluate symptoms and progress in treatment, Medication management, and Plan continue current medications.  [name], DO 01/06/2023, 12:05 PM

## 2023-01-06 NOTE — Group Note (Signed)
Date:  01/06/2023 Time:  11:38 AM  Group Topic/Focus:  Coping With Mental Health Crisis:   The purpose of this group is to help patients identify strategies for coping with mental health crisis.  Group discusses possible causes of crisis and ways to manage them effectively.    Participation Level:  Active  Participation Quality:  Appropriate  Affect:  Appropriate  Cognitive:  Appropriate  Insight: Appropriate  Engagement in Group:  Engaged  Modes of Intervention:  Discussion  Additional Comments:    Betty Henderson [name] 01/06/2023, 11:38 AM

## 2023-01-07 DIAGNOSIS — F250 Schizoaffective disorder, bipolar type: Secondary | ICD-10-CM | POA: Diagnosis not present

## 2023-01-07 NOTE — Group Note (Signed)
LCSW Group Therapy Note  Group Date: 01/07/2023 Start Time: 1335 End Time: 1405   Type of Therapy and Topic:  Group Therapy - Healthy vs Unhealthy Coping Skills  Participation Level:  Active   Description of Group The focus of this group was to determine what unhealthy coping techniques typically are used by group members and what healthy coping techniques would be helpful in coping with various problems. Patients were guided in becoming aware of the differences between healthy and unhealthy coping techniques. Patients were asked to identify 2-3 healthy coping skills they would like to learn to use more effectively.  Therapeutic Goals Patients learned that coping is what human beings do all day long to deal with various situations in their lives Patients defined and discussed healthy vs unhealthy coping techniques Patients identified their preferred coping techniques and identified whether these were healthy or unhealthy Patients determined 2-3 healthy coping skills they would like to become more familiar with and use more often. Patients provided support and ideas to each other   Summary of Patient Progress:  During group, Sumaya expressed memories of her children as methods to cope. Patient proved open to input from peers and feedback from CSW. Patient demonstrated positive insight into the subject matter, was respectful of peers, and participated throughout the entire session.   Therapeutic Modalities Cognitive Behavioral Therapy Motivational Interviewing  Whitney Post, [place] 01/07/2023  3:52 PM

## 2023-01-07 NOTE — Group Note (Signed)
Date:  01/07/2023 Time:  10:43 AM  Group Topic/Focus:  Making Healthy Choices:   The focus of this group is to help patients identify negative/unhealthy choices they were using prior to admission and identify positive/healthier coping strategies to replace them upon discharge.    Participation Level:  Active  Participation Quality:  Appropriate  Affect:  Appropriate  Cognitive:  Alert  Insight: Appropriate  Engagement in Group:  Engaged  Modes of Intervention:  Education  Additional Comments:    Ardelle Anton 01/07/2023, 10:43 AM

## 2023-01-07 NOTE — Group Note (Signed)
Recreation Therapy Group Note   Group Topic:Relaxation  Group Date: 01/07/2023 Start Time: 1400 End Time: 1445 Facilitators: Rosina Lowenstein, LRT, CTRS Location:  Day Room  Group Description: Meditation. LRT and patients discussed what they know about meditation and mindfulness. LRT played a Deep Breathing Meditation exercise script for patients to follow along to. LRT and patients discussed how meditation and deep breathing can be used as a coping skill post--discharge.   Goal Area(s) Addressed: Patient will practice using relaxation technique. Patient will identify a new coping skill.  Patient will follow multistep directions to reduce anxiety and stress.   Affect/Mood: Appropriate   Participation Level: Active and Engaged   Participation Quality: Independent   Behavior: Calm and Cooperative   Speech/Thought Process: Coherent   Insight: Good   Judgement: Fair    Modes of Intervention: Activity   Patient Response to Interventions:  Receptive   Education Outcome:  Acknowledges education   Clinical Observations/Individualized Feedback: Betty Henderson was active in their participation of session activities and group discussion. Pt interacted well with LRT and peers duration of session. Pt followed along with prompts appropriately.    Plan: Continue to engage patient in RT group sessions 2-3x/week.   Rosina Lowenstein, LRT, CTRS 01/07/2023 2:54 PM

## 2023-01-07 NOTE — Progress Notes (Signed)
Patient observed sitting in the day room area, coloring and interacting with peers and staff appropriately. Pt. Participated in shift assessment, denies SI/HI/AVH/Anxiety. Patient complains of lower back pain and received PRN Tylenol 650 mg PO and Trazodone 50 mg PO for insomnia. Medications effective. Patient rested well with no acute distress noted. Routine safety obs to continue.

## 2023-01-07 NOTE — Progress Notes (Signed)
[facility] MD Progress Note  Betty Henderson  MRN:  [id number]  Subjective: Case discussed in multidisciplinary meeting today, chart reviewed, patient seen today during rounds.  Social worker informed that patient has been assigned placement coordinator through DSS.  No behavioral issues reported by the staff.  Patient reports she is doing fine today.  No new acute events overnight reported by staff or patient.  Patient reports her appetite is good.  Patient denies any thoughts of harming herself or others.  Patient denies auditory or visual hallucinations. Patient is awaiting placement.  Principal Problem: Schizoaffective disorder, bipolar type (HCC) Diagnosis: Principal Problem:   Schizoaffective disorder, bipolar type (HCC)   Past Psychiatric History: Schizoaffective disorder, bipolar type.  Past Medical History:  Past Medical History:  Diagnosis Date   Anemia    Arthritis    Chronic pain    Drug-seeking behavior    Malingering    Osteopetrosis    Psychosis (HCC)    Schizoaffective disorder, bipolar type (HCC)     Past Surgical History:  Procedure Laterality Date   MOUTH SURGERY     TUBAL LIGATION     Family History:  Family History  Family history unknown: Yes   Family Psychiatric  History: Unremarkable Social History:  Social History   Substance and Sexual Activity  Alcohol Use Not Currently   Comment: 1 cocktail 3 weeks ago     Social History   Substance and Sexual Activity  Drug Use Not Currently   Types: Cocaine   Comment: states "it's legal though"    Social History   Socioeconomic History   Marital status: Single    Spouse name: Not on file   Number of children: Not on file   Years of education: Not on file   Highest education level: Not on file  Occupational History   Not on file  Tobacco Use   Smoking status: Every Day    Current packs/day: 0.50    Types: Cigarettes   Smokeless tobacco: Never  Vaping Use   Vaping status: Never Used   Substance and Sexual Activity   Alcohol use: Not Currently    Comment: 1 cocktail 3 weeks ago   Drug use: Not Currently    Types: Cocaine    Comment: states "it's legal though"   Sexual activity: Never  Other Topics Concern   Not on file  Social History Narrative   Not on file   Social Determinants of Health   Financial Resource Strain: Not on file  Food Insecurity: Food Insecurity Present (08/22/2022)   Hunger Vital Sign    Worried About Running Out of Food in the Last Year: Sometimes true    Ran Out of Food in the Last Year: Sometimes true  Transportation Needs: Patient Unable To Answer (08/22/2022)   PRAPARE - Transportation    Lack of Transportation (Medical): Patient unable to answer    Lack of Transportation (Non-Medical): Patient unable to answer  Recent Concern: Transportation Needs - Unmet Transportation Needs (06/13/2022)   Received from [facility], [facility]   [facility] - Transportation    Lack of Transportation (Medical): Not on file    Lack of Transportation (Non-Medical): Yes  Physical Activity: Not on file  Stress: Not on file  Social Connections: Unknown (08/06/2021)   Received from [facility], [facility]   Social Network    Social Network: Not on file   Additional Social History:     Patient is homeless at this time.  Social worker  is working with DSS on Medicaid application and  placement.                    Sleep: Good  Appetite:  Good  Current Medications: Current Facility-Administered Medications  Medication Dose Route Frequency Provider Last Rate Last Admin   acetaminophen (TYLENOL) tablet 650 mg  650 mg Oral Q6H PRN [name], DO   650 mg at 01/06/23 2141   alum & mag hydroxide-simeth (MAALOX/MYLANTA) 200-200-20 MG/5ML suspension 30 mL  30 mL Oral Q4H PRN [name], DO   30 mL at 09/20/22 1142   diphenhydrAMINE (BENADRYL) capsule 50 mg  50 mg Oral Q6H PRN [name], DO   50 mg at  09/29/22 2254   Or   diphenhydrAMINE (BENADRYL) injection 50 mg  50 mg Intramuscular Q6H PRN [name], DO       haloperidol (HALDOL) tablet 5 mg  5 mg Oral Q6H PRN [name], DO   5 mg at 09/28/22 2149   Or   haloperidol lactate (HALDOL) injection 5 mg  5 mg Intramuscular Q6H PRN [name], DO       ibuprofen (ADVIL) tablet 600 mg  600 mg Oral Q6H PRN [name], [name], MD   600 mg at 12/31/22 2106   LORazepam (ATIVAN) tablet 1 mg  1 mg Oral TID PRN [name], DO   1 mg at 09/27/22 0025   magnesium hydroxide (MILK OF MAGNESIA) suspension 30 mL  30 mL Oral Daily PRN [name], DO   30 mL at 11/08/22 1012   traZODone (DESYREL) tablet 50 mg  50 mg Oral QHS PRN [name], DO   50 mg at 01/06/23 2141    Lab Results: No results found for this or any previous visit (from the past 48 hour(s)).  Blood Alcohol level:  Lab Results  Component Value Date   ETH <10 08/17/2022   ETH <10 01/11/2021      Musculoskeletal: Strength & Muscle Tone: within normal limits Gait & Station: normal Patient leans: N/A   Psychiatric Specialty Exam:   Presentation  General Appearance:  Casual; Neat   Eye Contact: Fair   Speech: Spontaneous   Speech Volume: Normal   Handedness: Right     Mood and Affect  Mood: "Good"   Affect: Stable    Thought Processes: Improved,probably at baseline.    Descriptions of Associations: Intact   Orientation: Well-oriented   Thought Content:Denies SI/HI, at baseline   Hallucinations:Denies ,   Ideas of Reference:None noted   Suicidal Thoughts:Denies SI  Homicidal Thoughts:Denies HI   Sensorium  Memory: Immediate Fair; Remote Poor   Judgment: Fair, pt is Rx compliant   Insight: Improved     Executive Functions  Concentration: Improved   Attention Span: Improved   Language: Fair     Psychomotor Activity  Psychomotor Activity: Normal  Assets   Assets: Communication Skills     Sleep  Sleep:Improved     Physical Exam: Physical Exam Vitals and nursing note reviewed.  Constitutional:      Appearance: Normal appearance. She is normal weight.  Neurological:     General: No focal deficit present.     Mental Status: She is alert.      Review of Systems  Constitutional: Negative.   HENT: Negative.    Eyes: Negative.   Respiratory: Negative.    Cardiovascular: Negative.   Gastrointestinal: Negative.   Genitourinary: Negative.  Musculoskeletal: Negative.   Skin: Negative.   Neurological: Negative.   Endo/Heme/Allergies: Negative.     Blood pressure 130/78, pulse 77, temperature (!) 97.5 F (36.4 C), resp. rate 18, height 5\' 6"  (1.676 m), weight (P) 82.8 kg, SpO2 99%. Body mass index is 29.46 kg/m (pended).   Treatment Plan Summary: Daily contact with patient to assess and evaluate symptoms and progress in treatment, Medication management, and Plan continue current medications.  Continue to monitor patient on as needed meds   Patient received  dose of Invega Sustenna 156 mg IM on 12/18/2022 Repeat every 4 weeks    [name], MD

## 2023-01-07 NOTE — Plan of Care (Signed)
Problem: Education: Goal: Knowledge of General Education information will improve Description: Including pain rating scale, medication(s)/side effects and non-pharmacologic comfort measures Outcome: Progressing   Problem: Health Behavior/Discharge Planning: Goal: Ability to manage health-related needs will improve Outcome: Progressing   Problem: Clinical Measurements: Goal: Ability to maintain clinical measurements within normal limits will improve Outcome: Progressing Goal: Diagnostic test results will improve Outcome: Progressing   Problem: Nutrition: Goal: Adequate nutrition will be maintained Outcome: Progressing   Problem: Coping: Goal: Level of anxiety will decrease Outcome: Progressing   Problem: Elimination: Goal: Will not experience complications related to bowel motility Outcome: Progressing Goal: Will not experience complications related to urinary retention Outcome: Progressing   Problem: Pain Managment: Goal: General experience of comfort will improve Outcome: Progressing   Problem: Safety: Goal: Ability to remain free from injury will improve Outcome: Progressing   Problem: Skin Integrity: Goal: Risk for impaired skin integrity will decrease Outcome: Progressing

## 2023-01-08 DIAGNOSIS — F250 Schizoaffective disorder, bipolar type: Secondary | ICD-10-CM | POA: Diagnosis not present

## 2023-01-08 NOTE — Progress Notes (Signed)
   01/08/23 2200  Psych Admission Type (Psych Patients Only)  Admission Status Voluntary  Psychosocial Assessment  Patient Complaints None  Eye Contact Fair  Facial Expression Flat  Affect Flat  Speech Logical/coherent  Interaction Minimal  Motor Activity Shuffling  Appearance/Hygiene In scrubs  Behavior Characteristics Cooperative  Mood Pleasant  Thought Process  Coherency WDL  Content WDL  Delusions None reported or observed  Perception WDL  Hallucination None reported or observed  Judgment Impaired  Confusion None  Danger to Self  Current suicidal ideation? Denies  Agreement Not to Harm Self Yes  Description of Agreement verbal  Danger to Others  Danger to Others None reported or observed  Danger to Others Abnormal  Harmful Behavior to others No threats or harm toward other people  Destructive Behavior No threats or harm toward property

## 2023-01-08 NOTE — Group Note (Signed)
Date:  01/08/2023 Time:  5:54 PM  Group Topic/Focus:  Making Healthy Choices:   The focus of this group is to help patients identify negative/unhealthy choices they were using prior to admission and identify positive/healthier coping strategies to replace them upon discharge.    Participation Level:  Active  Participation Quality:  Appropriate  Affect:  Appropriate  Cognitive:  Appropriate  Insight: Appropriate  Engagement in Group:  Engaged  Modes of Intervention:  Education  Additional Comments:    Ardelle Anton 01/08/2023, 5:54 PM

## 2023-01-08 NOTE — Plan of Care (Signed)
  Problem: Education: Goal: Knowledge of General Education information will improve Description: Including pain rating scale, medication(s)/side effects and non-pharmacologic comfort measures Outcome: Progressing   Problem: Health Behavior/Discharge Planning: Goal: Ability to manage health-related needs will improve Outcome: Progressing   Problem: Clinical Measurements: Goal: Ability to maintain clinical measurements within normal limits will improve Outcome: Progressing Goal: Diagnostic test results will improve Outcome: Progressing   Problem: Nutrition: Goal: Adequate nutrition will be maintained Outcome: Progressing   Problem: Coping: Goal: Level of anxiety will decrease Outcome: Progressing

## 2023-01-08 NOTE — Progress Notes (Signed)
[facility] MD Progress Note  Betty Henderson  MRN:  [id number]  Subjective: Case discussed in multidisciplinary meeting today, chart reviewed, patient seen today during rounds.  Social worker informed that patient has been assigned placement coordinator through DSS.  No behavioral issues reported by the staff.  Patient reports she is doing fine today.  No new acute events overnight reported by staff or patient.  Patient reports her appetite is good.  Patient denies any thoughts of harming herself or others.  Patient denies auditory or visual hallucinations. Patient is awaiting placement.  Principal Problem: Schizoaffective disorder, bipolar type (HCC) Diagnosis: Principal Problem:   Schizoaffective disorder, bipolar type (HCC)   Past Psychiatric History: Schizoaffective disorder, bipolar type.  Past Medical History:  Past Medical History:  Diagnosis Date   Anemia    Arthritis    Chronic pain    Drug-seeking behavior    Malingering    Osteopetrosis    Psychosis (HCC)    Schizoaffective disorder, bipolar type (HCC)     Past Surgical History:  Procedure Laterality Date   MOUTH SURGERY     TUBAL LIGATION     Family History:  Family History  Family history unknown: Yes   Family Psychiatric  History: Unremarkable Social History:  Social History   Substance and Sexual Activity  Alcohol Use Not Currently   Comment: 1 cocktail 3 weeks ago     Social History   Substance and Sexual Activity  Drug Use Not Currently   Types: Cocaine   Comment: states "it's legal though"    Social History   Socioeconomic History   Marital status: Single    Spouse name: Not on file   Number of children: Not on file   Years of education: Not on file   Highest education level: Not on file  Occupational History   Not on file  Tobacco Use   Smoking status: Every Day    Current packs/day: 0.50    Types: Cigarettes   Smokeless tobacco: Never  Vaping Use   Vaping status: Never Used   Substance and Sexual Activity   Alcohol use: Not Currently    Comment: 1 cocktail 3 weeks ago   Drug use: Not Currently    Types: Cocaine    Comment: states "it's legal though"   Sexual activity: Never  Other Topics Concern   Not on file  Social History Narrative   Not on file   Social Determinants of Health   Financial Resource Strain: Not on file  Food Insecurity: Food Insecurity Present (08/22/2022)   Hunger Vital Sign    Worried About Running Out of Food in the Last Year: Sometimes true    Ran Out of Food in the Last Year: Sometimes true  Transportation Needs: Patient Unable To Answer (08/22/2022)   PRAPARE - Transportation    Lack of Transportation (Medical): Patient unable to answer    Lack of Transportation (Non-Medical): Patient unable to answer  Recent Concern: Transportation Needs - Unmet Transportation Needs (06/13/2022)   Received from [facility], [facility]   [facility] - Transportation    Lack of Transportation (Medical): Not on file    Lack of Transportation (Non-Medical): Yes  Physical Activity: Not on file  Stress: Not on file  Social Connections: Unknown (08/06/2021)   Received from [facility], [facility]   Social Network    Social Network: Not on file   Additional Social History:     Patient is homeless at this time.  Social worker  is working with DSS on Medicaid application and  placement.                    Sleep: Good  Appetite:  Good  Current Medications: Current Facility-Administered Medications  Medication Dose Route Frequency Provider Last Rate Last Admin   acetaminophen (TYLENOL) tablet 650 mg  650 mg Oral Q6H PRN [name], DO   650 mg at 01/07/23 2136   alum & mag hydroxide-simeth (MAALOX/MYLANTA) 200-200-20 MG/5ML suspension 30 mL  30 mL Oral Q4H PRN [name], DO   30 mL at 09/20/22 1142   diphenhydrAMINE (BENADRYL) capsule 50 mg  50 mg Oral Q6H PRN [name], DO   50 mg at  09/29/22 2254   Or   diphenhydrAMINE (BENADRYL) injection 50 mg  50 mg Intramuscular Q6H PRN [name], DO       haloperidol (HALDOL) tablet 5 mg  5 mg Oral Q6H PRN [name], DO   5 mg at 09/28/22 2149   Or   haloperidol lactate (HALDOL) injection 5 mg  5 mg Intramuscular Q6H PRN [name], DO       ibuprofen (ADVIL) tablet 600 mg  600 mg Oral Q6H PRN [name], [name], MD   600 mg at 12/31/22 2106   LORazepam (ATIVAN) tablet 1 mg  1 mg Oral TID PRN [name], DO   1 mg at 09/27/22 0025   magnesium hydroxide (MILK OF MAGNESIA) suspension 30 mL  30 mL Oral Daily PRN [name], DO   30 mL at 11/08/22 1012   traZODone (DESYREL) tablet 50 mg  50 mg Oral QHS PRN [name], DO   50 mg at 01/07/23 2136    Lab Results: No results found for this or any previous visit (from the past 48 hour(s)).  Blood Alcohol level:  Lab Results  Component Value Date   ETH <10 08/17/2022   ETH <10 01/11/2021      Musculoskeletal: Strength & Muscle Tone: within normal limits Gait & Station: normal Patient leans: N/A   Psychiatric Specialty Exam:   Presentation  General Appearance:  Casual; Neat   Eye Contact: Fair   Speech: Spontaneous   Speech Volume: Normal   Handedness: Right     Mood and Affect  Mood: "Good"   Affect: Stable    Thought Processes: Improved,probably at baseline.    Descriptions of Associations: Intact   Orientation: Well-oriented   Thought Content:Denies SI/HI, at baseline   Hallucinations:Denies ,   Ideas of Reference:None noted   Suicidal Thoughts:Denies SI  Homicidal Thoughts:Denies HI   Sensorium  Memory: Immediate Fair; Remote Poor   Judgment: Fair, pt is Rx compliant   Insight: Improved     Executive Functions  Concentration: Improved   Attention Span: Improved   Language: Fair     Psychomotor Activity  Psychomotor Activity: Normal  Assets   Assets: Communication Skills     Sleep  Sleep:Improved     Physical Exam: Physical Exam Vitals and nursing note reviewed.  Constitutional:      Appearance: Normal appearance. She is normal weight.  Neurological:     General: No focal deficit present.     Mental Status: She is alert.      Review of Systems  Constitutional: Negative.   HENT: Negative.    Eyes: Negative.   Respiratory: Negative.    Cardiovascular: Negative.   Gastrointestinal: Negative.   Genitourinary: Negative.  Musculoskeletal: Negative.   Skin: Negative.   Neurological: Negative.   Endo/Heme/Allergies: Negative.     Blood pressure 110/65, pulse 77, temperature (!) 97.5 F (36.4 C), resp. rate 16, height 5\' 6"  (1.676 m), weight (P) 82.8 kg, SpO2 94%. Body mass index is 29.46 kg/m (pended).   Treatment Plan Summary: Daily contact with patient to assess and evaluate symptoms and progress in treatment, Medication management, and Plan continue current medications.  Continue to monitor patient on as needed meds   Patient received  dose of Invega Sustenna 156 mg IM on 12/18/2022 Next Due 01/15/23    [name], MD

## 2023-01-08 NOTE — Progress Notes (Signed)
Patient observed lying in bed, resting. Patient participated in shift assessment appropriate, denies SI/HI/AVH/anxiety and rated pain (8) in bilateral thighs and hips. Pt. Med compliant, received PRN Trazodone 50 mg for insomnia and Tylenol 650- mg PO for pain. Medication effective. This patient participated in group in the day room, ate a snack, and went to bed without incident. No acute distress noted. Routine observations to monitor pt safety to continue.

## 2023-01-08 NOTE — Progress Notes (Signed)
   01/08/23 1141  Psych Admission Type (Psych Patients Only)  Admission Status Voluntary  Psychosocial Assessment  Patient Complaints None  Eye Contact Fair  Facial Expression Flat  Affect Flat  Speech Logical/coherent  Interaction Minimal  Motor Activity Shuffling  Appearance/Hygiene In scrubs  Behavior Characteristics Cooperative  Mood Pleasant  Thought Process  Coherency WDL  Content WDL  Delusions None reported or observed  Perception WDL  Hallucination None reported or observed  Judgment Impaired  Confusion None  Danger to Self  Current suicidal ideation? Denies  Danger to Others  Danger to Others None reported or observed  Danger to Others Abnormal  Harmful Behavior to others No threats or harm toward other people  Destructive Behavior No threats or harm toward property

## 2023-01-08 NOTE — Group Note (Signed)
Recreation Therapy Group Note   Group Topic:Goal Setting  Group Date: 01/08/2023 Start Time: 1400 End Time: 1500 Facilitators: Rosina Lowenstein, LRT, CTRS Location:  Dayroom  Group Description: Scientist, research (physical sciences). Patients were given many different magazines, a glue stick, markers, and a piece of cardstock paper. LRT and pts discussed the importance of having goals in life. LRT and pts discussed the difference between short-term and long-term goals, as well as what a SMART goal is. LRT encouraged pts to create a vision board, with images they picked and then cut out with safety scissors from the magazine, for themselves, that capture their short and long-term goals. LRT encouraged pts to show and explain their vision board to the group.   Goal Area(s) Addressed:  Patient will gain knowledge of short vs. long term goals.  Patient will identify goals for themselves. Patient will practice setting SMART goals. Patient will verbalize their goals to LRT and peers.   Affect/Mood: Appropriate   Participation Level: Active and Engaged   Participation Quality: Independent   Behavior: Calm and Cooperative   Speech/Thought Process: Coherent   Insight: Good   Judgement: Good   Modes of Intervention: Art   Patient Response to Interventions:  Attentive, Engaged, Interested , and Receptive   Education Outcome:  Acknowledges education   Clinical Observations/Individualized Feedback: Betty Henderson was active in their participation of session activities and group discussion. Pt identified "to sing more and get into fashion" as her goals. Pt interacted well with LRT and peers duration of session.   Plan: Continue to engage patient in RT group sessions 2-3x/week.   Rosina Lowenstein, LRT, CTRS 01/08/2023 3:31 PM

## 2023-01-08 NOTE — BH IP Treatment Plan (Signed)
Interdisciplinary Treatment and Diagnostic Plan Update  01/08/2023 Time of Session: 9:30 AM  Betty Henderson MRN: [id number]  Principal Diagnosis: Schizoaffective disorder, bipolar type (HCC)  Secondary Diagnoses: Principal Problem:   Schizoaffective disorder, bipolar type (HCC)   Current Medications:  Current Facility-Administered Medications  Medication Dose Route Frequency Provider Last Rate Last Admin   acetaminophen (TYLENOL) tablet 650 mg  650 mg Oral Q6H PRN [name], DO   650 mg at 01/07/23 2136   alum & mag hydroxide-simeth (MAALOX/MYLANTA) 200-200-20 MG/5ML suspension 30 mL  30 mL Oral Q4H PRN [name], DO   30 mL at 09/20/22 1142   diphenhydrAMINE (BENADRYL) capsule 50 mg  50 mg Oral Q6H PRN [name], DO   50 mg at 09/29/22 2254   Or   diphenhydrAMINE (BENADRYL) injection 50 mg  50 mg Intramuscular Q6H PRN [name], DO       haloperidol (HALDOL) tablet 5 mg  5 mg Oral Q6H PRN [name], DO   5 mg at 09/28/22 2149   Or   haloperidol lactate (HALDOL) injection 5 mg  5 mg Intramuscular Q6H PRN [name], DO       ibuprofen (ADVIL) tablet 600 mg  600 mg Oral Q6H PRN [name], [name], MD   600 mg at 12/31/22 2106   LORazepam (ATIVAN) tablet 1 mg  1 mg Oral TID PRN [name], DO   1 mg at 09/27/22 0025   magnesium hydroxide (MILK OF MAGNESIA) suspension 30 mL  30 mL Oral Daily PRN [name], DO   30 mL at 11/08/22 1012   traZODone (DESYREL) tablet 50 mg  50 mg Oral QHS PRN [name], DO   50 mg at 01/07/23 2136   PTA Medications: Medications Prior to Admission  Medication Sig Dispense Refill Last Dose   ondansetron (ZOFRAN) 4 MG tablet Take 1 tablet (4 mg total) by mouth every 8 (eight) hours as needed for nausea or vomiting. (Patient not taking: Reported on 08/17/2022) 20 tablet 0     Patient Stressors: Financial difficulties   Health problems    Medication change or noncompliance    Patient Strengths: Ability for insight   Treatment Modalities: Medication Management, Group therapy, Case management,  1 to 1 session with clinician, Psychoeducation, Recreational therapy.   Physician Treatment Plan for Primary Diagnosis: Schizoaffective disorder, bipolar type (HCC) Long Term Goal(s): Improvement in symptoms so as ready for discharge   Short Term Goals: Ability to identify changes in lifestyle to reduce recurrence of condition will improve Ability to verbalize feelings will improve Ability to disclose and discuss suicidal ideas Ability to demonstrate self-control will improve Ability to identify and develop effective coping behaviors will improve Ability to maintain clinical measurements within normal limits will improve Compliance with prescribed medications will improve Ability to identify triggers associated with substance abuse/mental health issues will improve  Medication Management: Evaluate patient's response, side effects, and tolerance of medication regimen.  Therapeutic Interventions: 1 to 1 sessions, Unit Group sessions and Medication administration.  Evaluation of Outcomes: Progressing  Physician Treatment Plan for Secondary Diagnosis: Principal Problem:   Schizoaffective disorder, bipolar type (HCC)  Long Term Goal(s): Improvement in symptoms so as ready for discharge   Short Term Goals: Ability to identify changes in lifestyle to reduce recurrence of condition will improve Ability to verbalize feelings will improve Ability to disclose and discuss suicidal ideas Ability to demonstrate self-control will improve Ability to identify  and develop effective coping behaviors will improve Ability to maintain clinical measurements within normal limits will improve Compliance with prescribed medications will improve Ability to identify triggers associated with substance abuse/mental health issues will improve      Medication Management: Evaluate patient's response, side effects, and tolerance of medication regimen.  Therapeutic Interventions: 1 to 1 sessions, Unit Group sessions and Medication administration.  Evaluation of Outcomes: Progressing   RN Treatment Plan for Primary Diagnosis: Schizoaffective disorder, bipolar type (HCC) Long Term Goal(s): Knowledge of disease and therapeutic regimen to maintain health will improve  Short Term Goals: Ability to remain free from injury will improve, Ability to verbalize frustration and anger appropriately will improve, Ability to demonstrate self-control, Ability to participate in decision making will improve, Ability to verbalize feelings will improve, Ability to disclose and discuss suicidal ideas, Ability to identify and develop effective coping behaviors will improve, and Compliance with prescribed medications will improve  Medication Management: RN will administer medications as ordered by provider, will assess and evaluate patient's response and provide education to patient for prescribed medication. RN will report any adverse and/or side effects to prescribing provider.  Therapeutic Interventions: 1 on 1 counseling sessions, Psychoeducation, Medication administration, Evaluate responses to treatment, Monitor vital signs and CBGs as ordered, Perform/monitor CIWA, COWS, AIMS and Fall Risk screenings as ordered, Perform wound care treatments as ordered.  Evaluation of Outcomes: Progressing   LCSW Treatment Plan for Primary Diagnosis: Schizoaffective disorder, bipolar type (HCC) Long Term Goal(s): Safe transition to appropriate next level of care at discharge, Engage patient in therapeutic group addressing interpersonal concerns.  Short Term Goals: Engage patient in aftercare planning with referrals and resources, Increase social support, Increase ability to appropriately verbalize feelings, Increase emotional regulation, Facilitate acceptance of mental  health diagnosis and concerns, Facilitate patient progression through stages of change regarding substance use diagnoses and concerns, Identify triggers associated with mental health/substance abuse issues, and Increase skills for wellness and recovery  Therapeutic Interventions: Assess for all discharge needs, 1 to 1 time with Social worker, Explore available resources and support systems, Assess for adequacy in community support network, Educate family and significant other(s) on suicide prevention, Complete Psychosocial Assessment, Interpersonal group therapy.  Evaluation of Outcomes: Progressing   Progress in Treatment: Attending groups: Yes. and No. Participating in groups: Yes. and No. Taking medication as prescribed: Yes. Toleration medication: Yes. Family/Significant other contact made: Yes, individual(s) contacted:  Karren Burly, son Patient understands diagnosis: Yes. Discussing patient identified problems/goals with staff: Yes. Medical problems stabilized or resolved: Yes. Denies suicidal/homicidal ideation: Yes. Issues/concerns per patient self-inventory: No. Other: None   New problem(s) identified: No, Describe:  none Updates 12/12/22: No changes made at this time Updates 12/17/22: No changes made at this time Update 12/24/22 No changes at this time. Update 12/29/2022: No changes at this time. Update 01/03/23: No changes at this time Update 01/08/23: No changes at this time        New Short Term/Long Term Goal(s): Update 6/30: none at this time. Update 09/27/2022:  No changes at this time.  Update 10/02/2022:  No changes at this time. Update 10/07/22: No changes at this time 10/12/22: No changes at this time Update 10/18/22: No changes at this time Update 10/23/22: No changes at this time Update 10/28/22: No changes at this time Update 11/07/22: No changes at this time Update 11/12/22: No changes at this time  Update 11/17/22: None at this time. Update 11/22/22: None at this time. Update  11/27/22 No changes at  this time. Update 12/02/22 No changes at this time  Update 12/07/22: None at this time. Updates 12/12/22: No changes made at this time Updates 12/17/22: No changes made at this time Update 12/24/22 No changes at this time. Update 12/29/2022: No changes at this time. Update 01/03/23: No changes at this time Update 01/08/23: No changes at this time          Patient Goals:  Update 6/30: none at this time. Update 09/27/2022:  No changes at this time. Update 10/02/2022:  No changes at this time. Update 10/07/22: No changes at this time 10/12/22: No changes at this time Update 10/18/22: No changes at this time Update 10/23/22: No changes at this time Update 10/28/22: No changes at this time Update 11/07/22: No changes at this time Update 11/12/22: No changes at this time Update 11/17/22: None at this time. Update 11/22/22: None at this time. Update 11/27/22 No changes at this time Update 12/02/22 No changes at this time   Update 12/07/22: None at this time. Updates 12/12/22: No changes made at this time Updates 12/17/22: No changes made at this time Update 12/24/22 No changes at this time. Update 12/29/2022: No changes at this time. Update 01/03/23: No changes at this time Update 01/08/23: No changes at this time        Discharge Plan or Barriers: Update 6/30: APS report has been made and patient is being investigated for guardianship needs.  Remains homeless with limited supports. Update 09/27/2022:  No changes at this time.  Update 10/02/2022:  Patient remains safe on the unit at this time.  Patient remains psychotic at this time.  APS report has been made, however, no follow up from the caseworker on her case.  No safe discharge identified.  CSW has requested that application for Medicaid and disability be completed.   Update 10/07/22: No changes at this time 10/12/22: No changes at this time Update 10/18/22: No changes at this time Update 10/23/22: No changes at this time Update 10/23/22: No changes at this time Update  10/28/22: According to [facility], pt's caseworker at DSS, the petition was filed for guardianship on 10/21/22, now awaiting for the petition to be approvedUpdate 11/07/22: No changes at this time Update 11/12/22: CSW sent over patients information to [facility] and [facility], awaiting a response. CSW awaiting word from DSS regarding pt's petition and what agency will become her guardian. Update 11/17/22: No changes at this time. Update 11/22/22: CSW has sent patients information to multiple facilities that were suggested by leadership, pt has been denied from the facility or the facility does not respond. CSW continues to send pt's information to nursing homes. Update 11/27/22 CSW contacted DSS to inquire about the status of guardianship. No response at this time Update 12/02/22 Supervisor Loraine Leriche contacted DSS who stated they are not taking pt on for guardianship but that Quincy Carnes will be present tomorrow to do a visit with social worker and pt and that pt's son will be signing documentation for placement for the pt. Update 12/07/22: [facility] DSS has declined to take guardianship. Son, Casimiro Needle is to step into a more present role in deciding placement. A conversation is still needed. Updates 12/12/22: CSW staffed case with leadership and they state that a family meeting must happen with pt's son. CSW contacted pt's financial navigator and her DSS worker so that they may be present on the call and are able to speak to the efforts they have made to secure pt funding  and housing. CSW waiting on responses from both, leadership is aware. Updates 12/17/22: CSW awaiting updates from both [facility] DSS and [facility] financial navigators as to status of pt's applications for Medicaid/[employer] and Disability, respectively. Update 12/24/22 CSW sent email regarding family meeting per request of supervisors. CSW awaiting responses from all parties to schedule family meeting. Update: 12/29/2022 No changes at  this time. Update 01/03/23: CSW continues to await responses on the scheduling of the family meeting. CSW received call from Primitivo Gauze at [employer], CSW sent FL2 to [place] to aide in pt's discharge planning. Update 01/08/23: CSW has reached out again to leadership and financial counseling. CSW continues to await a response. CSW called Sharlee Blew, awaiting a response for status of pt's placement options.       Reason for Continuation of Hospitalization: Hallucinations Mania Medication stabilization   Estimated Length of Stay:  Update 6/30: 1-7 days Update 09/27/2022:  TBD Update 10/02/2022:  No changes at this time. UpdaUpdate 10/18/22: No changes at this time te 10/07/22: No changes at this time Update 10/18/22: No changes at this time Update 10/23/22: No changes at this time Update 10/28/22: No changes at this time Update 11/07/22: No changes at this time Update 11/12/22: No changes at this time  Update 11/17/22: No changes at this time. Update 11/22/22: None at this time. Update 11/27/22 No changes at this time. Update 12/02/22 No changes at this time  Update 12/07/22: None at this time. Updates 12/12/22: No changes made at this time Updates 12/17/22: TBD. Update 12/29/2022: No changes at this time. Update 01/03/23: TBDUpdate 01/08/23: TBD Last 3 [place] Suicide Severity Risk Score: Flowsheet Row Admission (Current) from 08/22/2022 in [facility] ED from 08/17/2022 in [facility] Emergency Department at [facility] ED from 08/12/2022 in [facility] Emergency Department at [facility]  C-SSRS RISK CATEGORY Low Risk No Risk No Risk       Last [facility] 2/9 Scores:     No data to display          Scribe for Treatment Team: Elza Rafter, Theresia Majors 01/08/2023 12:10 PM

## 2023-01-09 DIAGNOSIS — F250 Schizoaffective disorder, bipolar type: Secondary | ICD-10-CM | POA: Diagnosis not present

## 2023-01-09 NOTE — Group Note (Signed)
Date:  01/09/2023 Time:  4:47 AM  Group Topic/Focus:  Coping With Mental Health Crisis:   The purpose of this group is to help patients identify strategies for coping with mental health crisis.  Group discusses possible causes of crisis and ways to manage them effectively. Wrap-Up Group:   The focus of this group is to help patients review their daily goal of treatment and discuss progress on daily workbooks.    Participation Level:  Did Not Attend   Additional Comments:    Betty Henderson 01/09/2023, 4:47 AM

## 2023-01-09 NOTE — Progress Notes (Signed)
   01/09/23 2100  Psych Admission Type (Psych Patients Only)  Admission Status Voluntary  Psychosocial Assessment  Patient Complaints None  Eye Contact Fair  Facial Expression Flat  Affect Flat  Speech Logical/coherent  Interaction Minimal  Motor Activity Slow;Shuffling  Appearance/Hygiene In scrubs  Behavior Characteristics Cooperative;Appropriate to situation  Mood Pleasant  Thought Process  Coherency WDL  Content WDL  Delusions None reported or observed  Perception WDL  Hallucination None reported or observed  Judgment Impaired  Confusion None  Danger to Self  Current suicidal ideation? Denies  Agreement Not to Harm Self Yes  Description of Agreement verbal  Danger to Others  Danger to Others None reported or observed  Danger to Others Abnormal  Harmful Behavior to others No threats or harm toward other people  Destructive Behavior No threats or harm toward property

## 2023-01-09 NOTE — Group Note (Signed)
LCSW Group Therapy Note  Group Date: 01/09/2023 Start Time: 1400 End Time: 1500   Type of Therapy and Topic:  Group Therapy - Healthy vs Unhealthy Coping Skills  Participation Level:  Active   Description of Group The focus of this group was to determine what unhealthy coping techniques typically are used by group members and what healthy coping techniques would be helpful in coping with various problems. Patients were guided in becoming aware of the differences between healthy and unhealthy coping techniques. Patients were asked to identify 2-3 healthy coping skills they would like to learn to use more effectively.  Therapeutic Goals Patients learned that coping is what human beings do all day long to deal with various situations in their lives Patients defined and discussed healthy vs unhealthy coping techniques Patients identified their preferred coping techniques and identified whether these were healthy or unhealthy Patients determined 2-3 healthy coping skills they would like to become more familiar with and use more often. 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 Patient proved open to input from peers and feedback from CSW. Patient demonstrated good  insight into the subject matter, was respectful of peers, and participated throughout the entire session.   Therapeutic Modalities Cognitive Behavioral Therapy Motivational Interviewing  Elza Rafter, [place] 01/09/2023  4:41 PM

## 2023-01-09 NOTE — Plan of Care (Signed)
D: Pt alert and oriented. Pt denies experiencing any anxiety/depression at this time. Pt denies experiencing any pain at this time however later reports some discomfort bilat legs and request to use a walker. Pt does not request any prn pain management medications. Pt denies experiencing any SI/HI, or AVH at this time.   A: Scheduled medications administered to pt, per MD orders. Support and encouragement provided. Frequent verbal contact made. Routine safety checks conducted q15 minutes.   R: No adverse drug reactions noted. Pt verbally contracts for safety at this time. Pt compliant with medications and treatment plan. Pt interacts well with others on the unit however mostly self isolates. Pt remains safe at this time. Plan of care ongoing.  Pt did attend groups today.  Problem: Nutrition: Goal: Adequate nutrition will be maintained Outcome: Progressing   Problem: Coping: Goal: Level of anxiety will decrease Outcome: Progressing

## 2023-01-09 NOTE — Progress Notes (Signed)
[facility] MD Progress Note  Betty Henderson  MRN:  [id number]  Subjective: Case discussed in multidisciplinary meeting today, chart reviewed, patient seen today during rounds.  Social worker informed that patient has been assigned placement coordinator through DSS.  No behavioral issues reported by the staff.  Patient reports she is doing fine today.  She was pleasant to talk to. No new acute events overnight reported by staff or patient. Patient reports her appetite is good.  Patient denies any thoughts of harming herself or others.  Patient denies auditory or visual hallucinations. Patient is awaiting placement.  Principal Problem: Schizoaffective disorder, bipolar type (HCC) Diagnosis: Principal Problem:   Schizoaffective disorder, bipolar type (HCC)   Past Psychiatric History: Schizoaffective disorder, bipolar type.  Past Medical History:  Past Medical History:  Diagnosis Date   Anemia    Arthritis    Chronic pain    Drug-seeking behavior    Malingering    Osteopetrosis    Psychosis (HCC)    Schizoaffective disorder, bipolar type (HCC)     Past Surgical History:  Procedure Laterality Date   MOUTH SURGERY     TUBAL LIGATION     Family History:  Family History  Family history unknown: Yes   Family Psychiatric  History: Unremarkable Social History:  Social History   Substance and Sexual Activity  Alcohol Use Not Currently   Comment: 1 cocktail 3 weeks ago     Social History   Substance and Sexual Activity  Drug Use Not Currently   Types: Cocaine   Comment: states "it's legal though"    Social History   Socioeconomic History   Marital status: Single    Spouse name: Not on file   Number of children: Not on file   Years of education: Not on file   Highest education level: Not on file  Occupational History   Not on file  Tobacco Use   Smoking status: Every Day    Current packs/day: 0.50    Types: Cigarettes   Smokeless tobacco: Never  Vaping Use   Vaping  status: Never Used  Substance and Sexual Activity   Alcohol use: Not Currently    Comment: 1 cocktail 3 weeks ago   Drug use: Not Currently    Types: Cocaine    Comment: states "it's legal though"   Sexual activity: Never  Other Topics Concern   Not on file  Social History Narrative   Not on file   Social Determinants of Health   Financial Resource Strain: Not on file  Food Insecurity: Food Insecurity Present (08/22/2022)   Hunger Vital Sign    Worried About Running Out of Food in the Last Year: Sometimes true    Ran Out of Food in the Last Year: Sometimes true  Transportation Needs: Patient Unable To Answer (08/22/2022)   PRAPARE - Transportation    Lack of Transportation (Medical): Patient unable to answer    Lack of Transportation (Non-Medical): Patient unable to answer  Recent Concern: Transportation Needs - Unmet Transportation Needs (06/13/2022)   Received from [facility], [facility]   [facility] - Transportation    Lack of Transportation (Medical): Not on file    Lack of Transportation (Non-Medical): Yes  Physical Activity: Not on file  Stress: Not on file  Social Connections: Unknown (08/06/2021)   Received from [facility], [facility]   Social Network    Social Network: Not on file   Additional Social History:     Patient is homeless at  this time.  Social worker is working with DSS on [employer] application and  placement.                    Sleep: Good  Appetite:  Good  Current Medications: Current Facility-Administered Medications  Medication Dose Route Frequency Provider Last Rate Last Admin   acetaminophen (TYLENOL) tablet 650 mg  650 mg Oral Q6H PRN [name], DO   650 mg at 01/08/23 2042   alum & mag hydroxide-simeth (MAALOX/MYLANTA) 200-200-20 MG/5ML suspension 30 mL  30 mL Oral Q4H PRN [name], DO   30 mL at 09/20/22 1142   diphenhydrAMINE (BENADRYL) capsule 50 mg  50 mg Oral Q6H PRN [name], DO    50 mg at 09/29/22 2254   Or   diphenhydrAMINE (BENADRYL) injection 50 mg  50 mg Intramuscular Q6H PRN [name], DO       haloperidol (HALDOL) tablet 5 mg  5 mg Oral Q6H PRN [name], DO   5 mg at 09/28/22 2149   Or   haloperidol lactate (HALDOL) injection 5 mg  5 mg Intramuscular Q6H PRN [name], DO       ibuprofen (ADVIL) tablet 600 mg  600 mg Oral Q6H PRN [name], [name], MD   600 mg at 12/31/22 2106   LORazepam (ATIVAN) tablet 1 mg  1 mg Oral TID PRN [name], DO   1 mg at 09/27/22 0025   magnesium hydroxide (MILK OF MAGNESIA) suspension 30 mL  30 mL Oral Daily PRN [name], DO   30 mL at 11/08/22 1012   traZODone (DESYREL) tablet 50 mg  50 mg Oral QHS PRN [name], DO   50 mg at 01/08/23 2042    Lab Results: No results found for this or any previous visit (from the past 48 hour(s)).  Blood Alcohol level:  Lab Results  Component Value Date   ETH <10 08/17/2022   ETH <10 01/11/2021      Musculoskeletal: Strength & Muscle Tone: within normal limits Gait & Station: normal Patient leans: N/A   Psychiatric Specialty Exam:   Presentation  General Appearance:  Casual; Neat   Eye Contact: Fair   Speech: Spontaneous   Speech Volume: Normal   Handedness: Right     Mood and Affect  Mood: "Good"   Affect: Stable    Thought Processes: Improved,probably at baseline.    Descriptions of Associations: Intact   Orientation: Well-oriented   Thought Content:Denies SI/HI, at baseline   Hallucinations:Denies ,   Ideas of Reference:None noted   Suicidal Thoughts:Denies SI  Homicidal Thoughts:Denies HI   Sensorium  Memory: Immediate Fair; Remote Poor   Judgment: Fair, pt is Rx compliant   Insight: Improved     Executive Functions  Concentration: Improved   Attention Span: Improved   Language: Fair     Psychomotor Activity  Psychomotor Activity:  Normal  Assets  Assets: Communication Skills     Sleep  Sleep:Improved     Physical Exam: Physical Exam Vitals and nursing note reviewed.  Constitutional:      Appearance: Normal appearance. She is normal weight.  Neurological:     General: No focal deficit present.     Mental Status: She is alert.      Review of Systems  Constitutional: Negative.   HENT: Negative.    Eyes: Negative.   Respiratory: Negative.    Cardiovascular: Negative.   Gastrointestinal: Negative.  Genitourinary: Negative.   Musculoskeletal: Negative.   Skin: Negative.   Neurological: Negative.   Endo/Heme/Allergies: Negative.     Blood pressure 121/70, pulse 69, temperature 98 F (36.7 C), resp. rate 18, height 5\' 6"  (1.676 m), weight (P) 82.8 kg, SpO2 99%. Body mass index is 29.46 kg/m (pended).   Treatment Plan Summary: Daily contact with patient to assess and evaluate symptoms and progress in treatment, Medication management, and Plan continue current medications.  Continue to monitor patient on as needed meds   Patient received  dose of Invega Sustenna 156 mg IM on 12/18/2022 Next Due 01/15/23    [name], MD

## 2023-01-09 NOTE — Group Note (Signed)
Date:  01/09/2023 Time:  3:44 PM  Group Topic/Focus:  Wellness Toolbox:   The focus of this group is to discuss various aspects of wellness, balancing those aspects and exploring ways to increase the ability to experience wellness.  Patients will create a wellness toolbox for use upon discharge.    Participation Level:  Did Not Attend  Participation Quality:      Affect:      Cognitive:      Insight: None  Engagement in Group:  None  Modes of Intervention:      Additional Comments:    [name] 01/09/2023, 3:44 PM

## 2023-01-09 NOTE — BHH Counselor (Signed)
CSW contacted Sharlee Blew at  [place] to discuss progress on placement for pt.   Camillie states that she needs psychological testing done to prove that pt lacks capacity to make her own decisions in order to retry for guardianship.   CSW informed Camillie that Dr.Herrick said a psychologist would have to do the testing on an outpatient basis.   Camillie states DSS can have a psychologist come out to evaluate pt next week on Tuesday.   CSW stated that would be fine.    CSW will inform leadership of updates.    Reynaldo Minium, MSW, [place] 01/09/2023 10:31 AM

## 2023-01-09 NOTE — Group Note (Signed)
Date:  01/09/2023 Time:  9:01 PM  Group Topic/Focus:  Developing a Wellness Toolbox:   The focus of this group is to help patients develop a "wellness toolbox" with skills and strategies to promote recovery upon discharge.    Participation Level:  Active  Participation Quality:  Appropriate  Affect:  Appropriate  Cognitive:  Appropriate  Insight: Appropriate  Engagement in Group:  Engaged  Modes of Intervention:  Education  Additional Comments:    [name] 01/09/2023, 9:01 PM

## 2023-01-09 NOTE — Progress Notes (Signed)
   01/09/23 1610  15 Minute Checks  Location Bedroom  Visual Appearance Calm  Behavior Sleeping  Sleep (Behavioral Health Patients Only)  Calculate sleep? (Click Yes once per 24 hr at 0600 safety check) Yes  Documented sleep last 24 hours 10

## 2023-01-10 DIAGNOSIS — F250 Schizoaffective disorder, bipolar type: Secondary | ICD-10-CM | POA: Diagnosis not present

## 2023-01-10 NOTE — Group Note (Signed)
Date:  01/10/2023 Time:  9:13 PM  Group Topic/Focus:  Overcoming Stress:   The focus of this group is to define stress and help patients assess their triggers.    Participation Level:  Minimal  Participation Quality:  Appropriate  Affect:  Appropriate  Cognitive:  Appropriate  Insight: Appropriate  Engagement in Group:  Engaged  Modes of Intervention:  Exploration  Additional Comments:    [name] 01/10/2023, 9:13 PM

## 2023-01-10 NOTE — Plan of Care (Signed)
[facility] Tonsillectomy/Adenoidectomy, Postoperative PEDS care plan entered in error.

## 2023-01-10 NOTE — Progress Notes (Signed)
   01/10/23 2100  Psych Admission Type (Psych Patients Only)  Admission Status Voluntary  Psychosocial Assessment  Patient Complaints None  Eye Contact Fair  Facial Expression Flat  Affect Flat  Speech Logical/coherent  Interaction Minimal  Motor Activity Slow;Shuffling  Appearance/Hygiene In scrubs  Behavior Characteristics Cooperative;Appropriate to situation  Mood Pleasant  Thought Process  Coherency WDL  Content WDL  Delusions None reported or observed  Perception WDL  Hallucination None reported or observed  Judgment Impaired  Confusion None  Danger to Self  Current suicidal ideation? Denies  Agreement Not to Harm Self Yes  Description of Agreement verbal  Danger to Others  Danger to Others None reported or observed  Danger to Others Abnormal  Harmful Behavior to others No threats or harm toward other people  Destructive Behavior No threats or harm toward property

## 2023-01-10 NOTE — Group Note (Signed)
Date:  01/10/2023 Time:  9:08 AM  Group Topic/Focus:  Self Care:   The focus of this group is to help patients understand the importance of self-care in order to improve or restore emotional, physical, spiritual, interpersonal, and financial health.    Participation Level:  Active  Participation Quality:  Appropriate, Attentive, and Supportive  Affect:  Appropriate  Cognitive:  Alert, Appropriate, and Oriented  Insight: Appropriate, Good, and Improving  Engagement in Group:  Engaged and Improving  Modes of Intervention:  Discussion  Additional Comments:     Alexis Frock 01/10/2023, 9:08 AM

## 2023-01-10 NOTE — Group Note (Signed)
Date:  01/10/2023 Time:  9:02 AM  Group Topic/Focus:  Healthy Communication:   The focus of this group is to discuss communication, barriers to communication, as well as healthy ways to communicate with others.       Participation Level:  Active  Participation Quality:  Appropriate, Attentive, Sharing, and Supportive  Affect:  Appropriate  Cognitive:  Alert, Appropriate, and Oriented  Insight: Appropriate and Good  Engagement in Group:  Engaged  Modes of Intervention:  Activity  Additional Comments:     Alexis Frock 01/10/2023, 9:02 AM

## 2023-01-10 NOTE — Progress Notes (Signed)
[facility] MD Progress Note  Betty Henderson  MRN:  [id number]  Subjective: Case discussed in multidisciplinary meeting today, chart reviewed, patient seen today during rounds.  Social worker informed that patient has been assigned placement coordinator through DSS.  No behavioral issues reported by the staff.  Reportedly patient took ibuprofen last night and this morning for backache Patient reports she is doing fine today.  She was pleasant to talk to.  Patient reports her appetite is good.  Patient denies any thoughts of harming herself or others.  Patient denies auditory or visual hallucinations. Patient is awaiting placement.  Principal Problem: Schizoaffective disorder, bipolar type (HCC) Diagnosis: Principal Problem:   Schizoaffective disorder, bipolar type (HCC)   Past Psychiatric History: Schizoaffective disorder, bipolar type.  Past Medical History:  Past Medical History:  Diagnosis Date   Anemia    Arthritis    Chronic pain    Drug-seeking behavior    Malingering    Osteopetrosis    Psychosis (HCC)    Schizoaffective disorder, bipolar type (HCC)     Past Surgical History:  Procedure Laterality Date   MOUTH SURGERY     TUBAL LIGATION     Family History:  Family History  Family history unknown: Yes   Family Psychiatric  History: Unremarkable Social History:  Social History   Substance and Sexual Activity  Alcohol Use Not Currently   Comment: 1 cocktail 3 weeks ago     Social History   Substance and Sexual Activity  Drug Use Not Currently   Types: Cocaine   Comment: states "it's legal though"    Social History   Socioeconomic History   Marital status: Single    Spouse name: Not on file   Number of children: Not on file   Years of education: Not on file   Highest education level: Not on file  Occupational History   Not on file  Tobacco Use   Smoking status: Every Day    Current packs/day: 0.50    Types: Cigarettes   Smokeless tobacco: Never   Vaping Use   Vaping status: Never Used  Substance and Sexual Activity   Alcohol use: Not Currently    Comment: 1 cocktail 3 weeks ago   Drug use: Not Currently    Types: Cocaine    Comment: states "it's legal though"   Sexual activity: Never  Other Topics Concern   Not on file  Social History Narrative   Not on file   Social Determinants of Health   Financial Resource Strain: Not on file  Food Insecurity: Food Insecurity Present (08/22/2022)   Hunger Vital Sign    Worried About Running Out of Food in the Last Year: Sometimes true    Ran Out of Food in the Last Year: Sometimes true  Transportation Needs: Patient Unable To Answer (08/22/2022)   PRAPARE - Transportation    Lack of Transportation (Medical): Patient unable to answer    Lack of Transportation (Non-Medical): Patient unable to answer  Recent Concern: Transportation Needs - Unmet Transportation Needs (06/13/2022)   Received from [facility], [facility]   [facility] - Transportation    Lack of Transportation (Medical): Not on file    Lack of Transportation (Non-Medical): Yes  Physical Activity: Not on file  Stress: Not on file  Social Connections: Unknown (08/06/2021)   Received from [facility], [facility]   Social Network    Social Network: Not on file   Additional Social History:     Patient is  homeless at this time.  Social worker is working with DSS on [employer] application and  placement.                    Sleep: Good  Appetite:  Good  Current Medications: Current Facility-Administered Medications  Medication Dose Route Frequency Provider Last Rate Last Admin   acetaminophen (TYLENOL) tablet 650 mg  650 mg Oral Q6H PRN [name], DO   650 mg at 01/09/23 2109   alum & mag hydroxide-simeth (MAALOX/MYLANTA) 200-200-20 MG/5ML suspension 30 mL  30 mL Oral Q4H PRN [name], DO   30 mL at 09/20/22 1142   diphenhydrAMINE (BENADRYL) capsule 50 mg  50 mg Oral Q6H PRN  [name], DO   50 mg at 09/29/22 2254   Or   diphenhydrAMINE (BENADRYL) injection 50 mg  50 mg Intramuscular Q6H PRN [name], DO       haloperidol (HALDOL) tablet 5 mg  5 mg Oral Q6H PRN [name], DO   5 mg at 09/28/22 2149   Or   haloperidol lactate (HALDOL) injection 5 mg  5 mg Intramuscular Q6H PRN [name], DO       ibuprofen (ADVIL) tablet 600 mg  600 mg Oral Q6H PRN [name], [name], MD   600 mg at 01/10/23 0816   LORazepam (ATIVAN) tablet 1 mg  1 mg Oral TID PRN [name], DO   1 mg at 09/27/22 0025   magnesium hydroxide (MILK OF MAGNESIA) suspension 30 mL  30 mL Oral Daily PRN [name], DO   30 mL at 11/08/22 1012   traZODone (DESYREL) tablet 50 mg  50 mg Oral QHS PRN [name], DO   50 mg at 01/09/23 2109    Lab Results: No results found for this or any previous visit (from the past 48 hour(s)).  Blood Alcohol level:  Lab Results  Component Value Date   ETH <10 08/17/2022   ETH <10 01/11/2021      Musculoskeletal: Strength & Muscle Tone: within normal limits Gait & Station: normal Patient leans: N/A   Psychiatric Specialty Exam:   Presentation  General Appearance:  Casual; Neat   Eye Contact: Fair   Speech: Spontaneous   Speech Volume: Normal   Handedness: Right     Mood and Affect  Mood: "Good"   Affect: Stable    Thought Processes: Improved,probably at baseline.    Descriptions of Associations: Intact   Orientation: Well-oriented   Thought Content:Denies SI/HI, at baseline   Hallucinations:Denies ,   Ideas of Reference:None noted   Suicidal Thoughts:Denies SI  Homicidal Thoughts:Denies HI   Sensorium  Memory: Immediate Fair; Remote Poor   Judgment: Fair, pt is Rx compliant   Insight: Improved     Executive Functions  Concentration: Improved   Attention Span: Improved   Language: Fair     Psychomotor Activity   Psychomotor Activity: Normal  Assets  Assets: Communication Skills     Sleep  Sleep:Improved     Physical Exam: Physical Exam Vitals and nursing note reviewed.  Constitutional:      Appearance: Normal appearance. She is normal weight.  Neurological:     General: No focal deficit present.     Mental Status: She is alert.      Review of Systems  Constitutional: Negative.   HENT: Negative.    Eyes: Negative.   Respiratory: Negative.    Cardiovascular: Negative.  Gastrointestinal: Negative.   Genitourinary: Negative.   Musculoskeletal: Negative.   Skin: Negative.   Neurological: Negative.   Endo/Heme/Allergies: Negative.     Blood pressure 116/74, pulse 84, temperature (!) 97.3 F (36.3 C), resp. rate 18, height 5\' 6"  (1.676 m), weight (P) 82.8 kg, SpO2 95%. Body mass index is 29.46 kg/m (pended).   Treatment Plan Summary: Daily contact with patient to assess and evaluate symptoms and progress in treatment, Medication management, and Plan continue current medications.  Continue to monitor patient on as needed meds   Patient received  dose of Invega Sustenna 156 mg IM on 12/18/2022 Next Due 01/15/23    [name], MD

## 2023-01-10 NOTE — Plan of Care (Signed)
D: Pt alert and oriented. Pt denies experiencing anxiety/depression at this time. Pt reports experiencing bilat 10/10 leg pain at this time, prn medication given. Pt denies experiencing any SI/HI, or AVH at this time.   A: Scheduled medications administered to pt, per MD orders. Support and encouragement provided. Frequent verbal contact made. Routine safety checks conducted q15 minutes.   R: No adverse drug reactions noted. Pt verbally contracts for safety at this time. Pt compliant with medications and treatment plan. Pt interacts well with others on the unit. Pt remains safe at this time. Plan of care ongoing.  Pt attended activity group outside today.   Problem: Education: Goal: Knowledge of General Education information will improve Description: Including pain rating scale, medication(s)/side effects and non-pharmacologic comfort measures Outcome: Progressing   Problem: Nutrition: Goal: Adequate nutrition will be maintained Outcome: Progressing

## 2023-01-10 NOTE — Progress Notes (Signed)
   01/10/23 0602  15 Minute Checks  Location Bedroom  Visual Appearance Calm  Behavior Sleeping  Sleep (Behavioral Health Patients Only)  Calculate sleep? (Click Yes once per 24 hr at 0600 safety check) Yes  Documented sleep last 24 hours 9.75

## 2023-01-11 DIAGNOSIS — F250 Schizoaffective disorder, bipolar type: Secondary | ICD-10-CM | POA: Diagnosis not present

## 2023-01-11 NOTE — Progress Notes (Signed)
   01/11/23 0557  15 Minute Checks  Location Bedroom  Visual Appearance Calm  Behavior Sleeping  Sleep (Behavioral Health Patients Only)  Calculate sleep? (Click Yes once per 24 hr at 0600 safety check) Yes  Documented sleep last 24 hours 11

## 2023-01-11 NOTE — Group Note (Signed)
Date:  01/11/2023 Time:  11:45 PM  Group Topic/Focus:  Developing a Wellness Toolbox:   The focus of this group is to help patients develop a "wellness toolbox" with skills and strategies to promote recovery upon discharge.    Participation Level:  Active  Participation Quality:  Appropriate  Affect:  Appropriate  Cognitive:  Appropriate  Insight: Appropriate  Engagement in Group:  Engaged  Modes of Intervention:  Exploration  Additional Comments:    [name] 01/11/2023, 11:45 PM

## 2023-01-11 NOTE — Group Note (Signed)
Date:  01/11/2023 Time:  6:30 PM  Group Topic/Focus:  Coping With Mental Health Crisis:   The purpose of this group is to help patients identify strategies for coping with mental health crisis.  Group discusses possible causes of crisis and ways to manage them effectively.    Participation Level:  Active  Participation Quality:  Appropriate  Affect:  Appropriate  Cognitive:  Appropriate  Insight: Appropriate  Engagement in Group:  Engaged  Modes of Intervention:  Activity  Additional Comments:    Betty Henderson 01/11/2023, 6:30 PM

## 2023-01-11 NOTE — Progress Notes (Signed)
[facility] MD Progress Note  Betty Henderson  MRN:  [id number]  Subjective: Case discussed in multidisciplinary meeting today, chart reviewed, patient seen today during rounds.  Social worker informed that patient has been assigned placement coordinator through DSS.  No behavioral issues reported by the staff.  Reportedly patient took ibuprofen last night and this morning for backache Patient reports she is doing fine today.  She was pleasant to talk to.  Patient reports her appetite is good.  Patient denies any thoughts of harming herself or others.  Patient denies auditory or visual hallucinations.  She is occasionally heard talking to self in her room.  Patient is awaiting placement.  Principal Problem: Schizoaffective disorder, bipolar type (HCC) Diagnosis: Principal Problem:   Schizoaffective disorder, bipolar type (HCC)   Past Psychiatric History: Schizoaffective disorder, bipolar type.  Past Medical History:  Past Medical History:  Diagnosis Date   Anemia    Arthritis    Chronic pain    Drug-seeking behavior    Malingering    Osteopetrosis    Psychosis (HCC)    Schizoaffective disorder, bipolar type (HCC)     Past Surgical History:  Procedure Laterality Date   MOUTH SURGERY     TUBAL LIGATION     Family History:  Family History  Family history unknown: Yes   Family Psychiatric  History: Unremarkable Social History:  Social History   Substance and Sexual Activity  Alcohol Use Not Currently   Comment: 1 cocktail 3 weeks ago     Social History   Substance and Sexual Activity  Drug Use Not Currently   Types: Cocaine   Comment: states "it's legal though"    Social History   Socioeconomic History   Marital status: Single    Spouse name: Not on file   Number of children: Not on file   Years of education: Not on file   Highest education level: Not on file  Occupational History   Not on file  Tobacco Use   Smoking status: Every Day    Current packs/day: 0.50     Types: Cigarettes   Smokeless tobacco: Never  Vaping Use   Vaping status: Never Used  Substance and Sexual Activity   Alcohol use: Not Currently    Comment: 1 cocktail 3 weeks ago   Drug use: Not Currently    Types: Cocaine    Comment: states "it's legal though"   Sexual activity: Never  Other Topics Concern   Not on file  Social History Narrative   Not on file   Social Determinants of Health   Financial Resource Strain: Not on file  Food Insecurity: Food Insecurity Present (08/22/2022)   Hunger Vital Sign    Worried About Running Out of Food in the Last Year: Sometimes true    Ran Out of Food in the Last Year: Sometimes true  Transportation Needs: Patient Unable To Answer (08/22/2022)   PRAPARE - Transportation    Lack of Transportation (Medical): Patient unable to answer    Lack of Transportation (Non-Medical): Patient unable to answer  Recent Concern: Transportation Needs - Unmet Transportation Needs (06/13/2022)   Received from [facility], [facility]   [facility] - Transportation    Lack of Transportation (Medical): Not on file    Lack of Transportation (Non-Medical): Yes  Physical Activity: Not on file  Stress: Not on file  Social Connections: Unknown (08/06/2021)   Received from [facility], [facility]   Social Network    Social Network: Not on  file   Additional Social History:     Patient is homeless at this time.  Social worker is working with DSS on [employer] application and  placement.                    Sleep: Good  Appetite:  Good  Current Medications: Current Facility-Administered Medications  Medication Dose Route Frequency Provider Last Rate Last Admin   acetaminophen (TYLENOL) tablet 650 mg  650 mg Oral Q6H PRN [name], DO   650 mg at 01/10/23 2103   alum & mag hydroxide-simeth (MAALOX/MYLANTA) 200-200-20 MG/5ML suspension 30 mL  30 mL Oral Q4H PRN [name], DO   30 mL at 09/20/22 1142   diphenhydrAMINE  (BENADRYL) capsule 50 mg  50 mg Oral Q6H PRN [name], DO   50 mg at 09/29/22 2254   Or   diphenhydrAMINE (BENADRYL) injection 50 mg  50 mg Intramuscular Q6H PRN [name], DO       haloperidol (HALDOL) tablet 5 mg  5 mg Oral Q6H PRN [name], DO   5 mg at 09/28/22 2149   Or   haloperidol lactate (HALDOL) injection 5 mg  5 mg Intramuscular Q6H PRN [name], DO       ibuprofen (ADVIL) tablet 600 mg  600 mg Oral Q6H PRN [name], [name], MD   600 mg at 01/11/23 1030   LORazepam (ATIVAN) tablet 1 mg  1 mg Oral TID PRN [name], DO   1 mg at 09/27/22 0025   magnesium hydroxide (MILK OF MAGNESIA) suspension 30 mL  30 mL Oral Daily PRN [name], DO   30 mL at 11/08/22 1012   traZODone (DESYREL) tablet 50 mg  50 mg Oral QHS PRN [name], DO   50 mg at 01/10/23 2103    Lab Results: No results found for this or any previous visit (from the past 48 hour(s)).  Blood Alcohol level:  Lab Results  Component Value Date   ETH <10 08/17/2022   ETH <10 01/11/2021      Musculoskeletal: Strength & Muscle Tone: within normal limits Gait & Station: normal Patient leans: N/A   Psychiatric Specialty Exam:   Presentation  General Appearance:  Casual; Neat   Eye Contact: Fair   Speech: Spontaneous   Speech Volume: Normal   Handedness: Right     Mood and Affect  Mood: "Good"   Affect: Stable    Thought Processes: Improved,probably at baseline.    Descriptions of Associations: Intact   Orientation: Well-oriented   Thought Content:Denies SI/HI, at baseline   Hallucinations:Denies ,   Ideas of Reference:None noted   Suicidal Thoughts:Denies SI  Homicidal Thoughts:Denies HI   Sensorium  Memory: Immediate Fair; Remote Poor   Judgment: Fair, pt is Rx compliant   Insight: Improved     Executive Functions  Concentration: Improved   Attention Span: Improved    Language: Fair     Psychomotor Activity  Psychomotor Activity: Normal  Assets  Assets: Communication Skills     Sleep  Sleep:Improved     Physical Exam: Physical Exam Vitals and nursing note reviewed.  Constitutional:      Appearance: Normal appearance. She is normal weight.  Neurological:     General: No focal deficit present.     Mental Status: She is alert.      Review of Systems  Constitutional: Negative.   HENT: Negative.    Eyes:  Negative.   Respiratory: Negative.    Cardiovascular: Negative.   Gastrointestinal: Negative.   Genitourinary: Negative.   Musculoskeletal: Negative.   Skin: Negative.   Neurological: Negative.   Endo/Heme/Allergies: Negative.     Blood pressure 112/61, pulse 74, temperature (!) 97.3 F (36.3 C), resp. rate 18, height 5\' 6"  (1.676 m), weight (P) 82.8 kg, SpO2 96%. Body mass index is 29.46 kg/m (pended).   Treatment Plan Summary: Daily contact with patient to assess and evaluate symptoms and progress in treatment, Medication management, and Plan continue current medications.  Continue to monitor patient on as needed meds   Patient received  dose of Invega Sustenna 156 mg IM on 12/18/2022 Next Due 01/15/23    [name], MD

## 2023-01-11 NOTE — Group Note (Signed)
Date:  01/11/2023 Time:  4:30 PM  Group Topic/Focus:  Activity Group:  The focus of the group is to encourage patients to step outside in the courtyard to get some fresh air and some exercise for the benefit of their mental health.    Participation Level:  Active  Participation Quality:  Appropriate  Affect:  Appropriate  Cognitive:  Appropriate  Insight: Appropriate  Engagement in Group:  Engaged  Modes of Intervention:  Activity  Additional Comments:    Betty Henderson 01/11/2023, 4:30 PM

## 2023-01-11 NOTE — Plan of Care (Signed)
  Problem: Nutrition: Goal: Adequate nutrition will be maintained Outcome: Progressing   Problem: Coping: Goal: Level of anxiety will decrease Outcome: Progressing  Pt has been isolative to her room this shift denies hearing voices at present endorses anxiety and sleep disturbances PRN Trazodone given and reported effective. No adverse medications effects noted. Support and encouragement ongoing. Patient ambulating with front wheel walker. All fall protocol in place.

## 2023-01-11 NOTE — Plan of Care (Signed)
D: Pt alert and oriented. Pt denies experiencing any anxiety/depression at this time. Pt reports experiencing 10/10 bilat leg pain at this time, prn medication given. Pt denies/reports experiencing any SI/HI, or AVH at this time.   A: Scheduled medications administered to pt, per MD orders. Support and encouragement provided. Frequent verbal contact made. Routine safety checks conducted q15 minutes.   R: No adverse drug reactions noted. Pt verbally contracts for safety at this time. Pt compliant with medications and treatment plan. Pt interacts minimally with others on the unit. Pt remains safe at this time. Plan of care ongoing.  Pt attended activity group held outside today.   Problem: Safety: Goal: Ability to remain free from injury will improve Outcome: Progressing   Problem: Pain Managment: Goal: General experience of comfort will improve Outcome: Not Progressing

## 2023-01-12 DIAGNOSIS — F250 Schizoaffective disorder, bipolar type: Secondary | ICD-10-CM | POA: Diagnosis not present

## 2023-01-12 NOTE — Group Note (Signed)
Date:  01/12/2023 Time:  4:55 PM  Group Topic/Focus:  Activity Group:  The focus of the group is to promote activity for the patients and encourage them to go outside to the courtyard and get some fresh air and some exercise.    Participation Level:  Active  Participation Quality:  Appropriate  Affect:  Appropriate  Cognitive:  Appropriate  Insight: Appropriate  Engagement in Group:  Engaged  Modes of Intervention:  Activity  Additional Comments:    Betty Henderson 01/12/2023, 4:55 PM

## 2023-01-12 NOTE — Progress Notes (Signed)
   01/12/23 0728  Psych Admission Type (Psych Patients Only)  Admission Status Voluntary  Psychosocial Assessment  Patient Complaints None  Eye Contact Brief  Facial Expression Flat  Affect Flat  Speech Soft  Interaction Minimal  Motor Activity Shuffling  Appearance/Hygiene In scrubs  Behavior Characteristics Cooperative;Calm  Mood Pleasant  Thought Process  Coherency WDL  Content WDL  Delusions None reported or observed  Perception WDL  Hallucination None reported or observed  Judgment Impaired  Confusion None  Danger to Self  Current suicidal ideation? Denies  Danger to Others  Danger to Others None reported or observed

## 2023-01-12 NOTE — Group Note (Signed)
LCSW Group Therapy Note   Group Date: 01/12/2023 Start Time: 1240 End Time: 1330   Type of Therapy and Topic:  Group Therapy: Self-Care  Participation Level:  Active    Summary of Patient Progress:  The patient attended group. The patient came in a little late. Patient proved open to input from peers and feedback from [facility] The patient was respectful of peers and participated throughout the entire session. The patient participated during today's icebreaker questions. The patient shared her excitement for moving into own apartment and stated that decorating it will be her way of practicing self-care.      Marshell Levan, LCSWA 01/12/2023  3:20 PM

## 2023-01-12 NOTE — Progress Notes (Signed)
[facility] MD Progress Note  Betty Henderson  MRN:  [id number]  Subjective: Case discussed in multidisciplinary meeting today, chart reviewed, patient seen today during rounds.  Social worker informed that patient has been assigned placement coordinator through DSS.  No behavioral issues reported by the staff  Patient reports she is doing fine today.  She was pleasant to talk to.  Patient reports her appetite is good.  Patient denies any thoughts of harming herself or others.  Patient denies auditory or visual hallucinations.  She is occasionally heard talking to self in her room.  Patient is awaiting placement.  Principal Problem: Schizoaffective disorder, bipolar type (HCC) Diagnosis: Principal Problem:   Schizoaffective disorder, bipolar type (HCC)   Past Psychiatric History: Schizoaffective disorder, bipolar type.  Past Medical History:  Past Medical History:  Diagnosis Date   Anemia    Arthritis    Chronic pain    Drug-seeking behavior    Malingering    Osteopetrosis    Psychosis (HCC)    Schizoaffective disorder, bipolar type (HCC)     Past Surgical History:  Procedure Laterality Date   MOUTH SURGERY     TUBAL LIGATION     Family History:  Family History  Family history unknown: Yes   Family Psychiatric  History: Unremarkable Social History:  Social History   Substance and Sexual Activity  Alcohol Use Not Currently   Comment: 1 cocktail 3 weeks ago     Social History   Substance and Sexual Activity  Drug Use Not Currently   Types: Cocaine   Comment: states "it's legal though"    Social History   Socioeconomic History   Marital status: Single    Spouse name: Not on file   Number of children: Not on file   Years of education: Not on file   Highest education level: Not on file  Occupational History   Not on file  Tobacco Use   Smoking status: Every Day    Current packs/day: 0.50    Types: Cigarettes   Smokeless tobacco: Never  Vaping Use   Vaping  status: Never Used  Substance and Sexual Activity   Alcohol use: Not Currently    Comment: 1 cocktail 3 weeks ago   Drug use: Not Currently    Types: Cocaine    Comment: states "it's legal though"   Sexual activity: Never  Other Topics Concern   Not on file  Social History Narrative   Not on file   Social Determinants of Health   Financial Resource Strain: Not on file  Food Insecurity: Food Insecurity Present (08/22/2022)   Hunger Vital Sign    Worried About Running Out of Food in the Last Year: Sometimes true    Ran Out of Food in the Last Year: Sometimes true  Transportation Needs: Patient Unable To Answer (08/22/2022)   PRAPARE - Transportation    Lack of Transportation (Medical): Patient unable to answer    Lack of Transportation (Non-Medical): Patient unable to answer  Recent Concern: Transportation Needs - Unmet Transportation Needs (06/13/2022)   Received from [facility], [facility]   [facility] - [facility]    Lack of Transportation (Medical): Not on file    Lack of Transportation (Non-Medical): Yes  Physical Activity: Not on file  Stress: Not on file  Social Connections: Unknown (08/06/2021)   Received from [facility], [facility]   Social Network    Social Network: Not on file   Additional Social History:     Patient  is homeless at this time.  Social worker is working with DSS on [employer] application and  placement.                    Sleep: Good  Appetite:  Good  Current Medications: Current Facility-Administered Medications  Medication Dose Route Frequency Provider Last Rate Last Admin   acetaminophen (TYLENOL) tablet 650 mg  650 mg Oral Q6H PRN [name], DO   650 mg at 01/11/23 2132   alum & mag hydroxide-simeth (MAALOX/MYLANTA) 200-200-20 MG/5ML suspension 30 mL  30 mL Oral Q4H PRN [name], DO   30 mL at 09/20/22 1142   diphenhydrAMINE (BENADRYL) capsule 50 mg  50 mg Oral Q6H PRN [name], DO    50 mg at 09/29/22 2254   Or   diphenhydrAMINE (BENADRYL) injection 50 mg  50 mg Intramuscular Q6H PRN [name], DO       haloperidol (HALDOL) tablet 5 mg  5 mg Oral Q6H PRN [name], DO   5 mg at 09/28/22 2149   Or   haloperidol lactate (HALDOL) injection 5 mg  5 mg Intramuscular Q6H PRN [name], DO       ibuprofen (ADVIL) tablet 600 mg  600 mg Oral Q6H PRN [name], [name], MD   600 mg at 01/11/23 1030   LORazepam (ATIVAN) tablet 1 mg  1 mg Oral TID PRN [name], DO   1 mg at 09/27/22 0025   magnesium hydroxide (MILK OF MAGNESIA) suspension 30 mL  30 mL Oral Daily PRN [name], DO   30 mL at 11/08/22 1012   traZODone (DESYREL) tablet 50 mg  50 mg Oral QHS PRN [name], DO   50 mg at 01/11/23 2132    Lab Results: No results found for this or any previous visit (from the past 48 hour(s)).  Blood Alcohol level:  Lab Results  Component Value Date   ETH <10 08/17/2022   ETH <10 01/11/2021      Musculoskeletal: Strength & Muscle Tone: within normal limits Gait & Station: normal Patient leans: N/A   Psychiatric Specialty Exam:   Presentation  General Appearance:  Casual; Neat   Eye Contact: Fair   Speech: Spontaneous   Speech Volume: Normal   Handedness: Right     Mood and Affect  Mood: "Good"   Affect: Stable    Thought Processes: Improved,probably at baseline.    Descriptions of Associations: Intact   Orientation: Well-oriented   Thought Content:Denies SI/HI, at baseline   Hallucinations:Denies  Ideas of Reference:None noted   Suicidal Thoughts:Denies SI  Homicidal Thoughts:Denies HI   Sensorium  Memory: Immediate Fair; Remote Poor   Judgment: Fair, pt is Rx compliant   Insight: Improved     Executive Functions  Concentration: Improved   Attention Span: Improved   Language: Fair     Psychomotor Activity  Psychomotor Activity:  Normal  Assets  Assets: Communication Skills     Sleep  Sleep:Improved     Physical Exam: Physical Exam Vitals and nursing note reviewed.  Constitutional:      Appearance: Normal appearance. She is normal weight.  Neurological:     General: No focal deficit present.     Mental Status: She is alert.      Review of Systems  Constitutional: Negative.   HENT: Negative.    Eyes: Negative.   Respiratory: Negative.    Cardiovascular: Negative.   Gastrointestinal:  Negative.   Genitourinary: Negative.   Musculoskeletal: Negative.   Skin: Negative.   Neurological: Negative.   Endo/Heme/Allergies: Negative.     Blood pressure 119/76, pulse 75, temperature (!) 97.5 F (36.4 C), resp. rate 18, height 5\' 6"  (1.676 m), weight (P) 82.8 kg, SpO2 100%. Body mass index is 29.46 kg/m (pended).   Treatment Plan Summary: Daily contact with patient to assess and evaluate symptoms and progress in treatment, Medication management, and Plan continue current medications.  Continue to monitor patient on as needed meds   Patient received  dose of Invega Sustenna 156 mg IM on 12/18/2022 Next Due 01/15/23    [name], MD

## 2023-01-12 NOTE — Progress Notes (Signed)
   01/12/23 2300  Psych Admission Type (Psych Patients Only)  Admission Status Voluntary  Psychosocial Assessment  Patient Complaints None  Eye Contact Brief  Facial Expression Flat  Affect Flat  Speech Soft  Interaction Minimal  Motor Activity Shuffling  Appearance/Hygiene Layered clothes  Behavior Characteristics Cooperative;Calm  Mood Pleasant  Thought Process  Coherency WDL  Content WDL  Delusions None reported or observed  Perception WDL  Hallucination None reported or observed  Judgment Impaired  Confusion None  Danger to Self  Current suicidal ideation? Denies  Agreement Not to Harm Self Yes  Description of Agreement verbal  Danger to Others  Danger to Others None reported or observed  Danger to Others Abnormal  Harmful Behavior to others No threats or harm toward other people  Destructive Behavior No threats or harm toward property

## 2023-01-13 DIAGNOSIS — F250 Schizoaffective disorder, bipolar type: Secondary | ICD-10-CM | POA: Diagnosis not present

## 2023-01-13 MED ORDER — PALIPERIDONE PALMITATE ER 156 MG/ML IM SUSY
156.0000 mg | PREFILLED_SYRINGE | INTRAMUSCULAR | Status: DC
  Administered 2023-01-15 – 2023-03-12 (×3): 156 mg via INTRAMUSCULAR
  Filled 2023-01-13 (×8): qty 1

## 2023-01-13 NOTE — Progress Notes (Signed)
   01/13/23 0900  Psych Admission Type (Psych Patients Only)  Admission Status Voluntary  Psychosocial Assessment  Patient Complaints None  Eye Contact Brief  Facial Expression Flat  Affect Flat  Speech Soft  Interaction Minimal  Motor Activity Shuffling  Appearance/Hygiene In scrubs  Behavior Characteristics Cooperative;Calm  Mood Pleasant  Thought Process  Coherency WDL  Content WDL  Delusions None reported or observed  Perception WDL  Hallucination None reported or observed  Judgment Impaired  Confusion None  Danger to Self  Current suicidal ideation? Denies  Danger to Others  Danger to Others None reported or observed

## 2023-01-13 NOTE — Group Note (Signed)
Date:  01/13/2023 Time:  3:36 AM  Group Topic/Focus:  Recovery Goals:   The focus of this group is to identify appropriate goals for recovery and establish a plan to achieve them.    Participation Level:  Active  Participation Quality:  Appropriate  Affect:  Appropriate  Cognitive:  Appropriate  Insight: Improving  Engagement in Group:  Improving  Modes of Intervention:  Discussion  Additional Comments:    Betty Henderson 01/13/2023, 3:36 AM

## 2023-01-13 NOTE — Group Note (Signed)
Date:  01/13/2023 Time:  10:41 PM  Group Topic/Focus:  Wrap-Up Group:   The focus of this group is to help patients review their daily goal of treatment and discuss progress on daily workbooks.    Participation Level:  Active  Participation Quality:  Appropriate and Attentive  Affect:  Appropriate  Cognitive:  Alert  Insight: Limited  Engagement in Group:  Lacking and Limited  Modes of Intervention:  Support  Additional Comments:     Maglione,Dayonna Selbe E 01/13/2023, 10:41 PM

## 2023-01-13 NOTE — [facility] Counselor (Signed)
CSW contacted Sharlee Blew [phone]) at [facility].   Camillie reports that she was off on Friday and sent referral in for pt to be seen by clinical staff this morning and is waiting on a date that they are available to come and screen pt.   CSW verbalized understanding and asked for a return call when date is settled.   Reynaldo Minium, MSW, [place] 01/13/2023 1:58 PM

## 2023-01-13 NOTE — Progress Notes (Signed)
[facility] MD Progress Note  Betty Henderson  MRN:  [id number]  Subjective: Case discussed in multidisciplinary meeting today, chart reviewed, patient seen today during rounds.  Social worker informed that patient has been assigned placement coordinator through DSS.  No behavioral issues reported by the staff.  Social worker shared that patient has been scheduled for psychological testing to determine Patient's capacity to take care for self.  Patient reports she is doing fine today.  She was pleasant to talk to.  Patient has been attending groups and participating in milieu.  She is interacting well with her peers.  Patient denies auditory or visual hallucinations.  She is occasionally heard talking to self in her room. Patient denies manic symptoms  Principal Problem: Schizoaffective disorder, bipolar type (HCC) Diagnosis: Principal Problem:   Schizoaffective disorder, bipolar type (HCC)   Past Psychiatric History: Schizoaffective disorder, bipolar type.  Past Medical History:  Past Medical History:  Diagnosis Date   Anemia    Arthritis    Chronic pain    Drug-seeking behavior    Malingering    Osteopetrosis    Psychosis (HCC)    Schizoaffective disorder, bipolar type (HCC)     Past Surgical History:  Procedure Laterality Date   MOUTH SURGERY     TUBAL LIGATION     Family History:  Family History  Family history unknown: Yes   Family Psychiatric  History: Unremarkable Social History:  Social History   Substance and Sexual Activity  Alcohol Use Not Currently   Comment: 1 cocktail 3 weeks ago     Social History   Substance and Sexual Activity  Drug Use Not Currently   Types: Cocaine   Comment: states "it's legal though"    Social History   Socioeconomic History   Marital status: Single    Spouse name: Not on file   Number of children: Not on file   Years of education: Not on file   Highest education level: Not on file  Occupational History   Not on file   Tobacco Use   Smoking status: Every Day    Current packs/day: 0.50    Types: Cigarettes   Smokeless tobacco: Never  Vaping Use   Vaping status: Never Used  Substance and Sexual Activity   Alcohol use: Not Currently    Comment: 1 cocktail 3 weeks ago   Drug use: Not Currently    Types: Cocaine    Comment: states "it's legal though"   Sexual activity: Never  Other Topics Concern   Not on file  Social History Narrative   Not on file   Social Determinants of Health   Financial Resource Strain: Not on file  Food Insecurity: Food Insecurity Present (08/22/2022)   Hunger Vital Sign    Worried About Running Out of Food in the Last Year: Sometimes true    Ran Out of Food in the Last Year: Sometimes true  Transportation Needs: Patient Unable To Answer (08/22/2022)   PRAPARE - Transportation    Lack of Transportation (Medical): Patient unable to answer    Lack of Transportation (Non-Medical): Patient unable to answer  Recent Concern: Transportation Needs - Unmet Transportation Needs (06/13/2022)   Received from [facility], [facility]   [facility] - Transportation    Lack of Transportation (Medical): Not on file    Lack of Transportation (Non-Medical): Yes  Physical Activity: Not on file  Stress: Not on file  Social Connections: Unknown (08/06/2021)   Received from [facility], [facility]  Social Network    Social Network: Not on file   Additional Social History:     Patient is homeless at this time.  Social worker is working with DSS on [employer] application and  placement.                    Sleep: Good  Appetite:  Good  Current Medications: Current Facility-Administered Medications  Medication Dose Route Frequency Provider Last Rate Last Admin   acetaminophen (TYLENOL) tablet 650 mg  650 mg Oral Q6H PRN [name], DO   650 mg at 01/12/23 2113   alum & mag hydroxide-simeth (MAALOX/MYLANTA) 200-200-20 MG/5ML suspension 30 mL  30 mL Oral Q4H PRN  [name], DO   30 mL at 09/20/22 1142   diphenhydrAMINE (BENADRYL) capsule 50 mg  50 mg Oral Q6H PRN [name], DO   50 mg at 09/29/22 2254   Or   diphenhydrAMINE (BENADRYL) injection 50 mg  50 mg Intramuscular Q6H PRN [name], DO       haloperidol (HALDOL) tablet 5 mg  5 mg Oral Q6H PRN [name], DO   5 mg at 09/28/22 2149   Or   haloperidol lactate (HALDOL) injection 5 mg  5 mg Intramuscular Q6H PRN [name], DO       ibuprofen (ADVIL) tablet 600 mg  600 mg Oral Q6H PRN [name], [name], MD   600 mg at 01/11/23 1030   LORazepam (ATIVAN) tablet 1 mg  1 mg Oral TID PRN [name], DO   1 mg at 09/27/22 0025   magnesium hydroxide (MILK OF MAGNESIA) suspension 30 mL  30 mL Oral Daily PRN [name], DO   30 mL at 11/08/22 1012   traZODone (DESYREL) tablet 50 mg  50 mg Oral QHS PRN [name], DO   50 mg at 01/12/23 2113    Lab Results: No results found for this or any previous visit (from the past 48 hour(s)).  Blood Alcohol level:  Lab Results  Component Value Date   ETH <10 08/17/2022   ETH <10 01/11/2021      Musculoskeletal: Strength & Muscle Tone: within normal limits Gait & Station: normal Patient leans: N/A   Psychiatric Specialty Exam:   Presentation  General Appearance:  Casual; Neat   Eye Contact: Fair   Speech: Spontaneous   Speech Volume: Normal   Handedness: Right     Mood and Affect  Mood: "Good"   Affect: Stable and pleasant    Thought Processes: Improved, at baseline   Descriptions of Associations: Intact   Orientation: Well-oriented   Thought Content: Improved, at baseline  Hallucinations:Denies, occasionally heard talking to self in her room  Ideas of Reference:None noted   Suicidal Thoughts:Denies SI, no intention or plan  Homicidal Thoughts:Denies HI, no intention or plan   Sensorium  Memory: Immediate Fair;  Remote Poor   Judgment: Fair, pt is Rx compliant   Insight: Improved     Executive Functions  Concentration: Improved   Attention Span: Improved   Language: Fair     Psychomotor Activity  Psychomotor Activity: Normal  Assets  Assets: Communication Skills     Sleep  Sleep:Improved     Physical Exam: Physical Exam Vitals and nursing note reviewed.  Constitutional:      Appearance: Normal appearance. She is normal weight.  Neurological:     General: No focal deficit present.     Mental  Status: She is alert.      Review of Systems  Constitutional: Negative.   HENT: Negative.    Eyes: Negative.   Respiratory: Negative.    Cardiovascular: Negative.   Gastrointestinal: Negative.   Genitourinary: Negative.   Musculoskeletal: Negative.   Skin: Negative.   Neurological: Negative.   Endo/Heme/Allergies: Negative.     Blood pressure (!) 94/49, pulse 72, temperature 98 F (36.7 C), resp. rate 18, height 5\' 6"  (1.676 m), weight (P) 82.8 kg, SpO2 100%. Body mass index is 29.46 kg/m (pended).   Treatment Plan Summary: Daily contact with patient to assess and evaluate symptoms and progress in treatment, Medication management, and Plan continue current medications.  Continue to monitor patient on as needed meds   Patient received  dose of Invega Sustenna 156 mg IM on 12/18/2022 Next Due 01/15/23, has been ordered    [name], MD

## 2023-01-13 NOTE — Progress Notes (Signed)
   01/13/23 0558  15 Minute Checks  Location Bedroom  Visual Appearance Calm  Behavior Composed;Sleeping  Sleep (Behavioral Health Patients Only)  Calculate sleep? (Click Yes once per 24 hr at 0600 safety check) Yes  Documented sleep last 24 hours 10.25

## 2023-01-13 NOTE — BH IP Treatment Plan (Signed)
Interdisciplinary Treatment and Diagnostic Plan Update  01/13/2023 Time of Session: 9:30 AM  Betty Henderson MRN: [id number]  Principal Diagnosis: Schizoaffective disorder, bipolar type (HCC)  Secondary Diagnoses: Principal Problem:   Schizoaffective disorder, bipolar type (HCC)   Current Medications:  Current Facility-Administered Medications  Medication Dose Route Frequency Provider Last Rate Last Admin   acetaminophen (TYLENOL) tablet 650 mg  650 mg Oral Q6H PRN [name], DO   650 mg at 01/12/23 2113   alum & mag hydroxide-simeth (MAALOX/MYLANTA) 200-200-20 MG/5ML suspension 30 mL  30 mL Oral Q4H PRN [name], DO   30 mL at 09/20/22 1142   diphenhydrAMINE (BENADRYL) capsule 50 mg  50 mg Oral Q6H PRN [name], DO   50 mg at 09/29/22 2254   Or   diphenhydrAMINE (BENADRYL) injection 50 mg  50 mg Intramuscular Q6H PRN [name], DO       haloperidol (HALDOL) tablet 5 mg  5 mg Oral Q6H PRN [name], DO   5 mg at 09/28/22 2149   Or   haloperidol lactate (HALDOL) injection 5 mg  5 mg Intramuscular Q6H PRN [name], DO       ibuprofen (ADVIL) tablet 600 mg  600 mg Oral Q6H PRN [name], [name], MD   600 mg at 01/11/23 1030   LORazepam (ATIVAN) tablet 1 mg  1 mg Oral TID PRN [name], DO   1 mg at 09/27/22 0025   magnesium hydroxide (MILK OF MAGNESIA) suspension 30 mL  30 mL Oral Daily PRN [name], DO   30 mL at 11/08/22 1012   traZODone (DESYREL) tablet 50 mg  50 mg Oral QHS PRN [name], DO   50 mg at 01/12/23 2113   PTA Medications: Medications Prior to Admission  Medication Sig Dispense Refill Last Dose   ondansetron (ZOFRAN) 4 MG tablet Take 1 tablet (4 mg total) by mouth every 8 (eight) hours as needed for nausea or vomiting. (Patient not taking: Reported on 08/17/2022) 20 tablet 0     Patient Stressors: Financial difficulties   Health problems    Medication change or noncompliance    Patient Strengths: Ability for insight   Treatment Modalities: Medication Management, Group therapy, Case management,  1 to 1 session with clinician, Psychoeducation, Recreational therapy.   Physician Treatment Plan for Primary Diagnosis: Schizoaffective disorder, bipolar type (HCC) Long Term Goal(s): Improvement in symptoms so as ready for discharge   Short Term Goals: Ability to identify changes in lifestyle to reduce recurrence of condition will improve Ability to verbalize feelings will improve Ability to disclose and discuss suicidal ideas Ability to demonstrate self-control will improve Ability to identify and develop effective coping behaviors will improve Ability to maintain clinical measurements within normal limits will improve Compliance with prescribed medications will improve Ability to identify triggers associated with substance abuse/mental health issues will improve  Medication Management: Evaluate patient's response, side effects, and tolerance of medication regimen.  Therapeutic Interventions: 1 to 1 sessions, Unit Group sessions and Medication administration.  Evaluation of Outcomes: Progressing  Physician Treatment Plan for Secondary Diagnosis: Principal Problem:   Schizoaffective disorder, bipolar type (HCC)  Long Term Goal(s): Improvement in symptoms so as ready for discharge   Short Term Goals: Ability to identify changes in lifestyle to reduce recurrence of condition will improve Ability to verbalize feelings will improve Ability to disclose and discuss suicidal ideas Ability to demonstrate self-control will improve Ability to identify  and develop effective coping behaviors will improve Ability to maintain clinical measurements within normal limits will improve Compliance with prescribed medications will improve Ability to identify triggers associated with substance abuse/mental health issues will improve      Medication Management: Evaluate patient's response, side effects, and tolerance of medication regimen.  Therapeutic Interventions: 1 to 1 sessions, Unit Group sessions and Medication administration.  Evaluation of Outcomes: Progressing   RN Treatment Plan for Primary Diagnosis: Schizoaffective disorder, bipolar type (HCC) Long Term Goal(s): Knowledge of disease and therapeutic regimen to maintain health will improve  Short Term Goals: Ability to remain free from injury will improve, Ability to verbalize frustration and anger appropriately will improve, Ability to demonstrate self-control, Ability to participate in decision making will improve, Ability to verbalize feelings will improve, Ability to disclose and discuss suicidal ideas, Ability to identify and develop effective coping behaviors will improve, and Compliance with prescribed medications will improve  Medication Management: RN will administer medications as ordered by provider, will assess and evaluate patient's response and provide education to patient for prescribed medication. RN will report any adverse and/or side effects to prescribing provider.  Therapeutic Interventions: 1 on 1 counseling sessions, Psychoeducation, Medication administration, Evaluate responses to treatment, Monitor vital signs and CBGs as ordered, Perform/monitor CIWA, COWS, AIMS and Fall Risk screenings as ordered, Perform wound care treatments as ordered.  Evaluation of Outcomes: Progressing   LCSW Treatment Plan for Primary Diagnosis: Schizoaffective disorder, bipolar type (HCC) Long Term Goal(s): Safe transition to appropriate next level of care at discharge, Engage patient in therapeutic group addressing interpersonal concerns.  Short Term Goals: Engage patient in aftercare planning with referrals and resources, Increase social support, Increase ability to appropriately verbalize feelings, Increase emotional regulation, Facilitate acceptance of mental  health diagnosis and concerns, Facilitate patient progression through stages of change regarding substance use diagnoses and concerns, Identify triggers associated with mental health/substance abuse issues, and Increase skills for wellness and recovery  Therapeutic Interventions: Assess for all discharge needs, 1 to 1 time with Social worker, Explore available resources and support systems, Assess for adequacy in community support network, Educate family and significant other(s) on suicide prevention, Complete Psychosocial Assessment, Interpersonal group therapy.  Evaluation of Outcomes: Progressing   Progress in Treatment: Attending groups: Yes. and No. Participating in groups: Yes. and No. Taking medication as prescribed: Yes. Toleration medication: Yes. Family/Significant other contact made: Yes, individual(s) contacted:  Karren Burly, son  Patient understands diagnosis: Yes. Discussing patient identified problems/goals with staff: Yes. Medical problems stabilized or resolved: Yes. Denies suicidal/homicidal ideation: Yes. Issues/concerns per patient self-inventory: No. Other: None   New problem(s) identified: No, Describe:  none Updates 12/12/22: No changes made at this time Updates 12/17/22: No changes made at this time Update 12/24/22 No changes at this time. Update 12/29/2022: No changes at this time. Update 01/03/23: No changes at this time Update 01/08/23: No changes at this time Update 01/13/23: No changes at this time        New Short Term/Long Term Goal(s): Update 6/30: none at this time. Update 09/27/2022:  No changes at this time.  Update 10/02/2022:  No changes at this time. Update 10/07/22: No changes at this time 10/12/22: No changes at this time Update 10/18/22: No changes at this time Update 10/23/22: No changes at this time Update 10/28/22: No changes at this time Update 11/07/22: No changes at this time Update 11/12/22: No changes at this time  Update 11/17/22: None at this time.  Update 11/22/22: None  at this time. Update 11/27/22 No changes at this time. Update 12/02/22 No changes at this time  Update 12/07/22: None at this time. Updates 12/12/22: No changes made at this time Updates 12/17/22: No changes made at this time Update 12/24/22 No changes at this time. Update 12/29/2022: No changes at this time. Update 01/03/23: No changes at this time Update 01/08/23: No changes at this time Update 01/13/23: No changes at this time          Patient Goals:  Update 6/30: none at this time. Update 09/27/2022:  No changes at this time. Update 10/02/2022:  No changes at this time. Update 10/07/22: No changes at this time 10/12/22: No changes at this time Update 10/18/22: No changes at this time Update 10/23/22: No changes at this time Update 10/28/22: No changes at this time Update 11/07/22: No changes at this time Update 11/12/22: No changes at this time Update 11/17/22: None at this time. Update 11/22/22: None at this time. Update 11/27/22 No changes at this time Update 12/02/22 No changes at this time   Update 12/07/22: None at this time. Updates 12/12/22: No changes made at this time Updates 12/17/22: No changes made at this time Update 12/24/22 No changes at this time. Update 12/29/2022: No changes at this time. Update 01/03/23: No changes at this time Update 01/08/23: No changes at this time Update 01/13/23: No changes at this time        Discharge Plan or Barriers: Update 6/30: APS report has been made and patient is being investigated for guardianship needs.  Remains homeless with limited supports. Update 09/27/2022:  No changes at this time.  Update 10/02/2022:  Patient remains safe on the unit at this time.  Patient remains psychotic at this time.  APS report has been made, however, no follow up from the caseworker on her case.  No safe discharge identified.  CSW has requested that application for Medicaid and disability be completed.   Update 10/07/22: No changes at this time 10/12/22: No changes at this time Update  10/18/22: No changes at this time Update 10/23/22: No changes at this time Update 10/23/22: No changes at this time Update 10/28/22: According to [facility], pt's caseworker at DSS, the petition was filed for guardianship on 10/21/22, now awaiting for the petition to be approvedUpdate 11/07/22: No changes at this time Update 11/12/22: CSW sent over patients information to [facility] and [facility], awaiting a response. CSW awaiting word from DSS regarding pt's petition and what agency will become her guardian. Update 11/17/22: No changes at this time. Update 11/22/22: CSW has sent patients information to multiple facilities that were suggested by leadership, pt has been denied from the facility or the facility does not respond. CSW continues to send pt's information to nursing homes. Update 11/27/22 CSW contacted DSS to inquire about the status of guardianship. No response at this time Update 12/02/22 Supervisor Loraine Leriche contacted DSS who stated they are not taking pt on for guardianship but that Quincy Carnes will be present tomorrow to do a visit with social worker and pt and that pt's son will be signing documentation for placement for the pt. Update 12/07/22: [facility] DSS has declined to take guardianship. Son, Casimiro Needle is to step into a more present role in deciding placement. A conversation is still needed. Updates 12/12/22: CSW staffed case with leadership and they state that a family meeting must happen with pt's son. CSW contacted pt's financial navigator and her DSS worker so that  they may be present on the call and are able to speak to the efforts they have made to secure pt funding and housing. CSW waiting on responses from both, leadership is aware. Updates 12/17/22: CSW awaiting updates from both [facility] DSS and [facility] financial navigators as to status of pt's applications for Medicaid/[employer] and Disability, respectively. Update 12/24/22 CSW sent email regarding family meeting per  request of supervisors. CSW awaiting responses from all parties to schedule family meeting. Update: 12/29/2022 No changes at this time. Update 01/03/23: CSW continues to await responses on the scheduling of the family meeting. CSW received call from Primitivo Gauze at [employer], CSW sent FL2 to [place] to aide in pt's discharge planning. Update 01/08/23: CSW has reached out again to leadership and financial counseling. CSW continues to await a response. CSW called Sharlee Blew, awaiting a response for status of pt's placement options. Update 01/13/23: Pt is to meet with psychologist tomorrow 10/22 to complete capacity testing so that DSS can retry for guardianship        Reason for Continuation of Hospitalization: Hallucinations Mania Medication stabilization   Estimated Length of Stay:  Update 6/30: 1-7 days Update 09/27/2022:  TBD Update 10/02/2022:  No changes at this time. UpdaUpdate 10/18/22: No changes at this time te 10/07/22: No changes at this time Update 10/18/22: No changes at this time Update 10/23/22: No changes at this time Update 10/28/22: No changes at this time Update 11/07/22: No changes at this time Update 11/12/22: No changes at this time  Update 11/17/22: No changes at this time. Update 11/22/22: None at this time. Update 11/27/22 No changes at this time. Update 12/02/22 No changes at this time  Update 12/07/22: None at this time. Updates 12/12/22: No changes made at this time Updates 12/17/22: TBD. Update 12/29/2022: No changes at this time. Update 01/03/23: TBDUpdate 01/08/23: TBD Update 01/13/23: TBD  Last 3 [place] Suicide Severity Risk Score: Flowsheet Row Admission (Current) from 08/22/2022 in [facility] ED from 08/17/2022 in [facility] Emergency Department at [facility] ED from 08/12/2022 in [facility] Emergency Department at [facility]  C-SSRS RISK CATEGORY Low Risk No Risk No Risk       Last [facility] 2/9 Scores:     No data to display          Scribe  for Treatment Team: Elza Rafter, Theresia Majors 01/13/2023 10:54 AM

## 2023-01-13 NOTE — Group Note (Signed)
Recreation Therapy Group Note   Group Topic:Emotion Expression  Group Date: 01/13/2023 Start Time: 1400 End Time: 1455 Facilitators: Rosina Lowenstein, LRT, CTRS Location: Courtyard  Group Description: Music Reminisce. LRT encouraged patients to think of their favorite song(s) that reminded them of a positive memory or time in their life. LRT encouraged patient to talk about that memory aloud to the group. LRT played the song through a speaker for all to hear. LRT and patients discussed how thinking of a positive memory or time in their life can be used as a coping skill in everyday life post discharge.   Goal Area(s) Addressed: Patient will increase verbal communication by conversing with peers. Patient will contribute to group discussion with minimal prompting. Patient will reminisce a positive memory or moment in their life.    Affect/Mood: Appropriate and Flat   Participation Level: Active and Engaged   Participation Quality: Independent   Behavior: Calm and Cooperative   Speech/Thought Process: Coherent   Insight: Good   Judgement: Good   Modes of Intervention: Activity   Patient Response to Interventions:  Attentive and Receptive   Education Outcome:  Acknowledges education   Clinical Observations/Individualized Feedback: Panayiota was active in their participation of session activities and group discussion. Pt identified "Easy by Yevonne Aline" as one of her favorite songs because it "helps to relax". Pt interacted well with LRT and peers duration of session.   Plan: Continue to engage patient in RT group sessions 2-3x/week.   Rosina Lowenstein, LRT, CTRS 01/13/2023 3:07 PM

## 2023-01-13 NOTE — Progress Notes (Signed)
   01/13/23 2000  Psych Admission Type (Psych Patients Only)  Admission Status Voluntary  Psychosocial Assessment  Patient Complaints None  Eye Contact Fair  Facial Expression Flat  Affect Flat  Speech Soft  Interaction Minimal  Motor Activity Shuffling  Appearance/Hygiene In scrubs  Behavior Characteristics Cooperative;Calm  Mood Pleasant  Thought Process  Coherency WDL  Content WDL  Delusions None reported or observed  Perception WDL  Hallucination None reported or observed  Judgment Impaired  Confusion None  Danger to Self  Current suicidal ideation? Denies  Agreement Not to Harm Self Yes  Description of Agreement verbal  Danger to Others  Danger to Others None reported or observed  Danger to Others Abnormal  Harmful Behavior to others No threats or harm toward other people  Destructive Behavior No threats or harm toward property

## 2023-01-14 DIAGNOSIS — F250 Schizoaffective disorder, bipolar type: Secondary | ICD-10-CM | POA: Diagnosis not present

## 2023-01-14 NOTE — Progress Notes (Signed)
   01/14/23 2200  Psych Admission Type (Psych Patients Only)  Admission Status Voluntary  Psychosocial Assessment  Patient Complaints None  Eye Contact Fair  Facial Expression Flat  Affect Flat  Speech Soft  Interaction Minimal  Motor Activity Shuffling  Appearance/Hygiene In scrubs  Behavior Characteristics Cooperative  Mood Depressed  Thought Process  Coherency WDL  Content WDL  Delusions None reported or observed  Perception Hallucinations  Hallucination Auditory  Judgment Impaired  Confusion None  Danger to Self  Current suicidal ideation? Denies  Agreement Not to Harm Self Yes  Description of Agreement verbal  Danger to Others  Danger to Others None reported or observed  Danger to Others Abnormal  Harmful Behavior to others No threats or harm toward other people  Destructive Behavior No threats or harm toward property

## 2023-01-14 NOTE — Group Note (Signed)
Recreation Therapy Group Note   Group Topic:Problem Solving  Group Date: 01/14/2023 Start Time: 1400 End Time: 1450 Facilitators: Rosina Lowenstein, LRT, CTRS Location:  Day Room  Group Description: Life Boat. Patients were given the scenario that they are on a boat that is about to become shipwrecked, leaving them stranded on an [place]. They are asked to make a list of 15 different items that they want to take with them when they are stranded on the [place]. Patients are asked to rank their items from most important to least important, #1 being the most important and #15 being the least. Patients will work individually for the first round to come up with 15 items and then pair up with a peer(s) to condense their list and come up with one list of 15 items between the two of them. Patients or LRT will read aloud the 15 different items to the group after each round. LRT facilitated post-activity processing to discuss how this activity can be used in daily life post discharge.   Goal Area(s) Addressed:  Patient will identify priorities, wants and needs. Patient will communicate with LRT and peers. Patient will work collectively as a Administrator, Civil Service. Patient will work on Product manager.    Affect/Mood: Appropriate   Participation Level: Active and Engaged   Participation Quality: Independent   Behavior: Appropriate, Calm, and Cooperative   Speech/Thought Process: Loose association   Insight: Fair   Judgement: Fair    Modes of Intervention: Activity   Patient Response to Interventions:  Attentive, Engaged, Interested , and Receptive   Education Outcome:  Acknowledges education   Clinical Observations/Individualized Feedback: Betty Henderson was active in their participation of session activities and group discussion. Pt identified "cookie, cakes, soda, cigarettes, and soap" as things she will bring with her on the [place]. Pt interacted well with LRT and peers duration of session.   Plan:  Continue to engage patient in RT group sessions 2-3x/week.   Rosina Lowenstein, LRT, CTRS 01/14/2023 2:58 PM

## 2023-01-14 NOTE — Group Note (Signed)
Date:  01/14/2023 Time:  6:31 PM  Group Topic/Focus:  Healthy Communication:   The focus of this group is to discuss communication, barriers to communication, as well as healthy ways to communicate with others.    Participation Level:  Active  Participation Quality:  Appropriate  Affect:  Appropriate  Cognitive:  Alert, Appropriate, and Oriented  Insight: Appropriate  Engagement in Group:  Developing/Improving  Modes of Intervention:  Activity  Additional Comments:    Ammiel Guiney 01/14/2023, 6:31 PM

## 2023-01-14 NOTE — Plan of Care (Signed)
  Problem: Education: Goal: Knowledge of General Education information will improve Description Including pain rating scale, medication(s)/side effects and non-pharmacologic comfort measures Outcome: Progressing   Problem: Health Behavior/Discharge Planning: Goal: Ability to manage health-related needs will improve Outcome: Progressing   

## 2023-01-14 NOTE — BHH Counselor (Signed)
CSW returned missed call from [facility] [phone]).   Camillie states that she and the psychologist will be here on 01/15/23 at 9:30 AM to complete testing with pt   Betty Henderson, MSW, [facility] 01/14/2023 3:00 PM

## 2023-01-14 NOTE — Progress Notes (Signed)
   01/14/23 1300  Spiritual Encounters  Type of Visit Follow up  Care provided to: Patient  Referral source Chaplain assessment   While rounding chaplain met with patient in the dayroom. Patient shared that she was doing okay and that she was watching a movie with Melchor Amour. Nurse shared with chaplain that patient is doing very well today because she is out of her room and not self-isolating. Chaplain will continue to follow up with patient and her progress.

## 2023-01-14 NOTE — Progress Notes (Signed)
[facility] MD Progress Note  01/14/2023 1:33 PM Khani Precht  MRN:  [id number] Subjective: Betty Henderson is seen on rounds.  She has been compliant with medications.  She receives [place] on the 25th of each month.  No side effects.  She has been pleasant and cooperative.  DSS and social work continue to look for placement. Principal Problem: Schizoaffective disorder, bipolar type (HCC) Diagnosis: Principal Problem:   Schizoaffective disorder, bipolar type (HCC)  Total Time spent with patient: 15 minutes  Past Psychiatric History: Schizoaffective disorder  Past Medical History:  Past Medical History:  Diagnosis Date   Anemia    Arthritis    Chronic pain    Drug-seeking behavior    Malingering    Osteopetrosis    Psychosis (HCC)    Schizoaffective disorder, bipolar type (HCC)     Past Surgical History:  Procedure Laterality Date   MOUTH SURGERY     TUBAL LIGATION     Family History:  Family History  Family history unknown: Yes   Family Psychiatric  History: Unremarkable Social History:  Social History   Substance and Sexual Activity  Alcohol Use Not Currently   Comment: 1 cocktail 3 weeks ago     Social History   Substance and Sexual Activity  Drug Use Not Currently   Types: Cocaine   Comment: states "it's legal though"    Social History   Socioeconomic History   Marital status: Single    Spouse name: Not on file   Number of children: Not on file   Years of education: Not on file   Highest education level: Not on file  Occupational History   Not on file  Tobacco Use   Smoking status: Every Day    Current packs/day: 0.50    Types: Cigarettes   Smokeless tobacco: Never  Vaping Use   Vaping status: Never Used  Substance and Sexual Activity   Alcohol use: Not Currently    Comment: 1 cocktail 3 weeks ago   Drug use: Not Currently    Types: Cocaine    Comment: states "it's legal though"   Sexual activity: Never  Other Topics Concern   Not on file  Social  History Narrative   Not on file   Social Determinants of Health   Financial Resource Strain: Not on file  Food Insecurity: Food Insecurity Present (08/22/2022)   Hunger Vital Sign    Worried About Running Out of Food in the Last Year: Sometimes true    Ran Out of Food in the Last Year: Sometimes true  Transportation Needs: Patient Unable To Answer (08/22/2022)   PRAPARE - Transportation    Lack of Transportation (Medical): Patient unable to answer    Lack of Transportation (Non-Medical): Patient unable to answer  Recent Concern: Transportation Needs - Unmet Transportation Needs (06/13/2022)   Received from [facility]   [facility] - Transportation    Lack of Transportation (Medical): Not on file    Lack of Transportation (Non-Medical): Yes  Physical Activity: Not on file  Stress: Not on file  Social Connections: Unknown (08/06/2021)   Received from [facility]   Social Network    Social Network: Not on file   Additional Social History:                         Sleep: Good  Appetite:  Good  Current Medications: Current Facility-Administered Medications  Medication Dose Route Frequency Provider Last Rate Last  Admin   acetaminophen (TYLENOL) tablet 650 mg  650 mg Oral Q6H PRN [name], DO   650 mg at 01/13/23 2118   alum & mag hydroxide-simeth (MAALOX/MYLANTA) 200-200-20 MG/5ML suspension 30 mL  30 mL Oral Q4H PRN [name], DO   30 mL at 09/20/22 1142   diphenhydrAMINE (BENADRYL) capsule 50 mg  50 mg Oral Q6H PRN [name], DO   50 mg at 09/29/22 2254   Or   diphenhydrAMINE (BENADRYL) injection 50 mg  50 mg Intramuscular Q6H PRN [name], DO       haloperidol (HALDOL) tablet 5 mg  5 mg Oral Q6H PRN [name], DO   5 mg at 09/28/22 2149   Or   haloperidol lactate (HALDOL) injection 5 mg  5 mg Intramuscular Q6H PRN [name], DO       ibuprofen (ADVIL)  tablet 600 mg  600 mg Oral Q6H PRN [name], [name], MD   600 mg at 01/11/23 1030   LORazepam (ATIVAN) tablet 1 mg  1 mg Oral TID PRN [name], DO   1 mg at 09/27/22 0025   magnesium hydroxide (MILK OF MAGNESIA) suspension 30 mL  30 mL Oral Daily PRN [name], DO   30 mL at 11/08/22 1012   [START ON 01/15/2023] paliperidone (INVEGA SUSTENNA) injection 156 mg  156 mg Intramuscular Q28 days [name], MD       traZODone (DESYREL) tablet 50 mg  50 mg Oral QHS PRN [name], DO   50 mg at 01/13/23 2118    Lab Results: No results found for this or any previous visit (from the past 48 hour(s)).  Blood Alcohol level:  Lab Results  Component Value Date   ETH <10 08/17/2022   ETH <10 01/11/2021    Metabolic Disorder Labs: No results found for: "HGBA1C", "MPG" No results found for: "PROLACTIN" No results found for: "CHOL", "TRIG", "HDL", "CHOLHDL", "VLDL", "LDLCALC"  Physical Findings: AIMS:  , ,  ,  ,    CIWA:    COWS:     Musculoskeletal: Strength & Muscle Tone: within normal limits Gait & Station: normal Patient leans: N/A  Psychiatric Specialty Exam:  Presentation  General Appearance:  Casual; Neat  Eye Contact: Fair  Speech: Slow; Slurred  Speech Volume: Decreased  Handedness: Right   Mood and Affect  Mood: Anxious  Affect: Inappropriate   Thought Process  Thought Processes: Disorganized  Descriptions of Associations:Loose  Orientation:Partial  Thought Content:Illogical; Rumination  History of Schizophrenia/Schizoaffective disorder:No data recorded Duration of Psychotic Symptoms:No data recorded Hallucinations:No data recorded Ideas of Reference:Paranoia  Suicidal Thoughts:No data recorded Homicidal Thoughts:No data recorded  Sensorium  Memory: Immediate Fair; Remote Poor  Judgment: Poor  Insight: Poor   Executive Functions  Concentration: Poor  Attention  Span: Fair  Recall: [employer] of Knowledge: Fair  Language: Fair   Psychomotor Activity  Psychomotor Activity:No data recorded  Assets  Assets: Communication Skills   Sleep  Sleep:No data recorded    Blood pressure 134/88, pulse (!) 102, temperature (!) 97.3 F (36.3 C), resp. rate 17, height 5\' 6"  (1.676 m), weight (P) 82.8 kg, SpO2 100%. Body mass index is 29.46 kg/m (pended).   Treatment Plan Summary: Daily contact with patient to assess and evaluate symptoms and progress in treatment, Medication management, and Plan continue current medication.  [name], DO 01/14/2023, 1:33 PM

## 2023-01-14 NOTE — Progress Notes (Signed)
Pt presents with depressed mood,affect congruent. Leonida denies any acute concerns. She denies any SI HI. Pt able to make her needs known. Per SW pt remains for placement and further hearing for DSS guardianship due to capacity. Pt ate breakfast and ambulating on the unit. Will con't to monitor.

## 2023-01-14 NOTE — Group Note (Signed)
[facility] LCSW Group Therapy Note   Group Date: 01/14/2023 Start Time: 1315 End Time: 1400   Type of Therapy/Topic:  Group Therapy:  Emotion Regulation  Participation Level:  Active   Mood:  Description of Group:    The purpose of this group is to assist patients in learning to regulate negative emotions and experience positive emotions. Patients will be guided to discuss ways in which they have been vulnerable to their negative emotions. These vulnerabilities will be juxtaposed with experiences of positive emotions or situations, and patients challenged to use positive emotions to combat negative ones. Special emphasis will be placed on coping with negative emotions in conflict situations, and patients will process healthy conflict resolution skills.  Therapeutic Goals: Patient will identify two positive emotions or experiences to reflect on in order to balance out negative emotions:  Patient will label two or more emotions that they find the most difficult to experience:  Patient will be able to demonstrate positive conflict resolution skills through discussion or role plays:   Summary of Patient Progress:   Pt participated throughout the entirety of group and won emotional regulation bingo. Pt reports her time with her husband as a time when she felt the most emotionally regulated     Therapeutic Modalities:   Cognitive Behavioral Therapy Feelings Identification Dialectical Behavioral Therapy   Elza Rafter, LCSWA

## 2023-01-15 DIAGNOSIS — F250 Schizoaffective disorder, bipolar type: Secondary | ICD-10-CM | POA: Diagnosis not present

## 2023-01-15 NOTE — Group Note (Signed)
Recreation Therapy Group Note   Group Topic:General Recreation  Group Date: 01/15/2023 Start Time: 1400 End Time: 1455 Facilitators: Rosina Lowenstein, LRT, CTRS Location: Courtyard  Group Description: Outdoor Recreation. Patients had the option to play corn hole, ring toss, playing cards or listening to music while outside in the courtyard getting fresh air and sunlight. Marland Kitchen LRT and patients discussed things that they enjoy doing in their free time outside of the hospital. LRT encouraged patients to drink water after being active and getting their heart rate up.   Goal Area(s) Addressed: Patient will identify leisure interests.  Patient will practice healthy decision making. Patient will engage in recreation activity.   Affect/Mood: Appropriate   Participation Level: Active and Engaged   Participation Quality: Independent   Behavior: Calm and Cooperative   Speech/Thought Process: Coherent   Insight: Fair   Judgement: Fair    Modes of Intervention: Activity   Patient Response to Interventions:  Attentive and Receptive   Education Outcome:  Acknowledges education   Clinical Observations/Individualized Feedback: Evynn was active in their participation of session activities and group discussion. Pt requested different songs by Donn Pierini while outside in group. Pt interacted well with LRT and peers duration of session.   Plan: Continue to engage patient in RT group sessions 2-3x/week.   Rosina Lowenstein, LRT, CTRS 01/15/2023 3:28 PM

## 2023-01-15 NOTE — Progress Notes (Signed)
[facility] MD Progress Note  01/15/2023 1:23 PM Betty Henderson  MRN:  [id number] Subjective: Betty Henderson is seen on rounds.  She is unchanged.  She is pleasant cooperative and compliant with medications.  Social work continues to look for placement.  She denies any side effects from her medication and has no complaints. Principal Problem: Schizoaffective disorder, bipolar type (HCC) Diagnosis: Principal Problem:   Schizoaffective disorder, bipolar type (HCC)  Total Time spent with patient: 15 minutes  Past Psychiatric History: Schizoaffective disorder  Past Medical History:  Past Medical History:  Diagnosis Date   Anemia    Arthritis    Chronic pain    Drug-seeking behavior    Malingering    Osteopetrosis    Psychosis (HCC)    Schizoaffective disorder, bipolar type (HCC)     Past Surgical History:  Procedure Laterality Date   MOUTH SURGERY     TUBAL LIGATION     Family History:  Family History  Family history unknown: Yes   Family Psychiatric  History: Unremarkable Social History:  Social History   Substance and Sexual Activity  Alcohol Use Not Currently   Comment: 1 cocktail 3 weeks ago     Social History   Substance and Sexual Activity  Drug Use Not Currently   Types: Cocaine   Comment: states "it's legal though"    Social History   Socioeconomic History   Marital status: Single    Spouse name: Not on file   Number of children: Not on file   Years of education: Not on file   Highest education level: Not on file  Occupational History   Not on file  Tobacco Use   Smoking status: Every Day    Current packs/day: 0.50    Types: Cigarettes   Smokeless tobacco: Never  Vaping Use   Vaping status: Never Used  Substance and Sexual Activity   Alcohol use: Not Currently    Comment: 1 cocktail 3 weeks ago   Drug use: Not Currently    Types: Cocaine    Comment: states "it's legal though"   Sexual activity: Never  Other Topics Concern   Not on file  Social History  Narrative   Not on file   Social Determinants of Health   Financial Resource Strain: Not on file  Food Insecurity: Food Insecurity Present (08/22/2022)   Hunger Vital Sign    Worried About Running Out of Food in the Last Year: Sometimes true    Ran Out of Food in the Last Year: Sometimes true  Transportation Needs: Patient Unable To Answer (08/22/2022)   PRAPARE - Transportation    Lack of Transportation (Medical): Patient unable to answer    Lack of Transportation (Non-Medical): Patient unable to answer  Recent Concern: Transportation Needs - Unmet Transportation Needs (06/13/2022)   Received from [facility]   [facility] - Transportation    Lack of Transportation (Medical): Not on file    Lack of Transportation (Non-Medical): Yes  Physical Activity: Not on file  Stress: Not on file  Social Connections: Unknown (08/06/2021)   Received from [facility], [facility]   Social Network    Social Network: Not on file   Additional Social History:                         Sleep: Good  Appetite:  Good  Current Medications: Current Facility-Administered Medications  Medication Dose Route Frequency Provider Last Rate Last Admin   acetaminophen (  TYLENOL) tablet 650 mg  650 mg Oral Q6H PRN [name], DO   650 mg at 01/14/23 2136   alum & mag hydroxide-simeth (MAALOX/MYLANTA) 200-200-20 MG/5ML suspension 30 mL  30 mL Oral Q4H PRN [name], DO   30 mL at 09/20/22 1142   diphenhydrAMINE (BENADRYL) capsule 50 mg  50 mg Oral Q6H PRN [name], DO   50 mg at 09/29/22 2254   Or   diphenhydrAMINE (BENADRYL) injection 50 mg  50 mg Intramuscular Q6H PRN [name], DO       haloperidol (HALDOL) tablet 5 mg  5 mg Oral Q6H PRN [name], DO   5 mg at 09/28/22 2149   Or   haloperidol lactate (HALDOL) injection 5 mg  5 mg Intramuscular Q6H PRN [name], DO       ibuprofen (ADVIL) tablet 600  mg  600 mg Oral Q6H PRN [name], [name], MD   600 mg at 01/11/23 1030   LORazepam (ATIVAN) tablet 1 mg  1 mg Oral TID PRN [name], DO   1 mg at 09/27/22 0025   magnesium hydroxide (MILK OF MAGNESIA) suspension 30 mL  30 mL Oral Daily PRN [name], DO   30 mL at 11/08/22 1012   paliperidone (INVEGA SUSTENNA) injection 156 mg  156 mg Intramuscular Q28 days [name], MD       traZODone (DESYREL) tablet 50 mg  50 mg Oral QHS PRN [name], DO   50 mg at 01/14/23 2135    Lab Results: No results found for this or any previous visit (from the past 48 hour(s)).  Blood Alcohol level:  Lab Results  Component Value Date   ETH <10 08/17/2022   ETH <10 01/11/2021    Metabolic Disorder Labs: No results found for: "HGBA1C", "MPG" No results found for: "PROLACTIN" No results found for: "CHOL", "TRIG", "HDL", "CHOLHDL", "VLDL", "LDLCALC"  Physical Findings: AIMS:  , ,  ,  ,    CIWA:    COWS:     Musculoskeletal: Strength & Muscle Tone: within normal limits Gait & Station: normal Patient leans: N/A  Psychiatric Specialty Exam:  Presentation  General Appearance:  Casual; Neat  Eye Contact: Fair  Speech: Slow; Slurred  Speech Volume: Decreased  Handedness: Right   Mood and Affect  Mood: Anxious  Affect: Inappropriate   Thought Process  Thought Processes: Disorganized  Descriptions of Associations:Loose  Orientation:Partial  Thought Content:Illogical; Rumination  History of Schizophrenia/Schizoaffective disorder:No data recorded Duration of Psychotic Symptoms:No data recorded Hallucinations:No data recorded Ideas of Reference:Paranoia  Suicidal Thoughts:No data recorded Homicidal Thoughts:No data recorded  Sensorium  Memory: Immediate Fair; Remote Poor  Judgment: Poor  Insight: Poor   Executive Functions  Concentration: Poor  Attention Span: Fair  Recall: [employer] of  Knowledge: Fair  Language: Fair   Psychomotor Activity  Psychomotor Activity:No data recorded  Assets  Assets: Communication Skills   Sleep  Sleep:No data recorded    Blood pressure (!) 130/94, pulse 83, temperature (!) 97.5 F (36.4 C), resp. rate 17, height 5\' 6"  (1.676 m), weight (P) 82.8 kg, SpO2 97%. Body mass index is 29.46 kg/m (pended).   Treatment Plan Summary: Daily contact with patient to assess and evaluate symptoms and progress in treatment, Medication management, and Plan continue current medication.  [name], DO 01/15/2023, 1:23 PM

## 2023-01-15 NOTE — BHH Counselor (Signed)
CSW met with Sharlee Blew and Joes, psychologist at [facility].   Joe completed assessment with pt.   Joe reports pt scored a 24 on assessment, 1 point above normal range.   Camillie states she will followup with CSW once she staffs the case with her supervisor.    Reynaldo Minium, MSW, [place] 01/15/2023 9:58 AM

## 2023-01-15 NOTE — Group Note (Signed)
Date:  01/15/2023 Time:  10:01 PM  Group Topic/Focus:  Identifying Needs:   The focus of this group is to help patients identify their personal needs that have been historically problematic and identify healthy behaviors to address their needs.    Participation Level:  Minimal  Participation Quality:  Attentive  Affect:  Appropriate  Cognitive:  Alert  Insight: Appropriate  Engagement in Group:  Engaged  Modes of Intervention:  Exploration  Additional Comments:    [name] 01/15/2023, 10:01 PM

## 2023-01-15 NOTE — Plan of Care (Signed)
D: Pt alert and oriented. Pt denies experiencing any anxiety/depression at this time. Pt denies experiencing any pain at this time. Pt denies experiencing any SI/HI, or AVH at this time.   A: Scheduled medications administered to pt, per MD orders. Support and encouragement provided. Frequent verbal contact made. Routine safety checks conducted q15 minutes.   R: No adverse drug reactions noted. Pt verbally contracts for safety at this time. Pt compliant with medications and treatment plan. Pt interacts minimally with others on the unit. Pt remains safe at this time. Plan of care ongoing.  Pt received invega injectable today.   Problem: Education: Goal: Knowledge of General Education information will improve Description: Including pain rating scale, medication(s)/side effects and non-pharmacologic comfort measures Outcome: Progressing   Problem: Coping: Goal: Level of anxiety will decrease Outcome: Progressing

## 2023-01-16 DIAGNOSIS — F250 Schizoaffective disorder, bipolar type: Secondary | ICD-10-CM | POA: Diagnosis not present

## 2023-01-16 NOTE — Plan of Care (Signed)
D: Pt alert and oriented. Pt denies experiencing any anxiety/depression at this time. Pt reports/denies experiencing any pain at this time. Pt denies/reports experiencing any SI/HI, or AVH at this time.   A: Support and encouragement provided. Frequent verbal contact made. Routine safety checks conducted q15 minutes.   R: No adverse drug reactions noted. Pt verbally contracts for safety at this time. Pt compliant with treatment plan. Pt interacts well but minimally with others on the unit. Pt remains safe at this time. Plan of care ongoing.  Problem: Nutrition: Goal: Adequate nutrition will be maintained Outcome: Progressing   Problem: Pain Managment: Goal: General experience of comfort will improve Outcome: Progressing

## 2023-01-16 NOTE — Group Note (Signed)
Date:  01/16/2023 Time:  8:30 PM  Group Topic/Focus:  Managing Feelings:   The focus of this group is to identify what feelings patients have difficulty handling and develop a plan to handle them in a healthier way upon discharge.    Participation Level:  Active  Participation Quality:  Appropriate  Affect:  Appropriate  Cognitive:  Appropriate  Insight: Appropriate  Engagement in Group:  Limited  Modes of Intervention:  Discussion  Additional Comments:    [name] 01/16/2023, 8:30 PM

## 2023-01-16 NOTE — Group Note (Signed)
LCSW Group Therapy Note   Group Date: 01/16/2023 Start Time: 1328 End Time: 1400   Type of Therapy and Topic:  Group Therapy: Boundaries  Participation Level:  Minimal  Description of Group: This group will address the use of boundaries in their personal lives. Patients will explore why boundaries are important, the difference between healthy and unhealthy boundaries, and negative and postive outcomes of different boundaries and will look at how boundaries can be crossed.  Patients will be encouraged to identify current boundaries in their own lives and identify what kind of boundary is being set. Facilitators will guide patients in utilizing problem-solving interventions to address and correct types boundaries being used and to address when no boundary is being used. Understanding and applying boundaries will be explored and addressed for obtaining and maintaining a balanced life. Patients will be encouraged to explore ways to assertively make their boundaries and needs known to significant others in their lives, using other group members and facilitator for role play, support, and feedback.  Therapeutic Goals:  1.  Patient will identify areas in their life where setting clear boundaries could be  used to improve their life.  2.  Patient will identify signs/triggers that a boundary is not being respected. 3.  Patient will identify two ways to set boundaries in order to achieve balance in  their lives: 4.  Patient will demonstrate ability to communicate their needs and set boundaries  through discussion and/or role plays  Summary of Patient Progress:  Patient was present and minimally active throughout the session.    Therapeutic Modalities:   Cognitive Behavioral Therapy Solution-Focused Therapy  Rhett Bannister 01/16/2023  3:23 PM

## 2023-01-16 NOTE — Progress Notes (Signed)
   01/16/23 0630  15 Minute Checks  Location Bedroom  Visual Appearance Calm  Behavior Sleeping  Sleep (Behavioral Health Patients Only)  Calculate sleep? (Click Yes once per 24 hr at 0600 safety check) Yes  Documented sleep last 24 hours 11.75

## 2023-01-16 NOTE — Group Note (Addendum)
Recreation Therapy Group Note   Group Topic:Communication  Group Date: 01/16/2023 Start Time: 1400 End Time: 1450 Facilitators: Rosina Lowenstein, LRT, CTRS Location: Courtyard  Group Description: Emotional Check in. Patient sat and talked with LRT about how they are doing and whatever else is on their mind. LRT provided active listening, reassurance and encouragement. Pts were given the opportunity to listen to music or play cornhole while getting fresh air and sunlight in the courtyard.    Goal Area(s) Addressed: Patient will engage in conversation with LRT. Patient will communicate their wants, needs, or questions.  Patient will practice a new coping skill of "talking to someone".   Affect/Mood: Appropriate   Participation Level: Active and Engaged   Participation Quality: Independent   Behavior: Calm and Cooperative   Speech/Thought Process: Coherent   Insight: Good   Judgement: Good   Modes of Intervention: Open Conversation   Patient Response to Interventions:  Attentive, Engaged, Interested , and Receptive   Education Outcome:  Acknowledges education   Clinical Observations/Individualized Feedback: Betty Henderson was active in their participation of session activities and group discussion. Pt talked about her assessment she had the other day with the psychologist. Pt talked about hoping that she can be home before Christmas and that she is sad that her son doesn't want to help her. Pt asked LRT if she could have her nails cut. Pt was more talkative with LRT today than in recent previous sessions.    Plan: Continue to engage patient in RT group sessions 2-3x/week.   Rosina Lowenstein, LRT, CTRS 01/16/2023 3:05 PM

## 2023-01-16 NOTE — Progress Notes (Signed)
D- Patient alert and oriented. Isolates in room except for meds/meals. Denies anxiety/depression. Denies SI, HI, AVH, and pain.  A- Scheduled medications administered to patient, per MD orders. PRN given for insomnia. Support and encouragement provided.  Routine safety checks conducted every 15 minutes.  Patient informed to notify staff with problems or concerns. R- No adverse drug reactions noted. Patient contracts for safety at this time. Patient compliant with medications and treatment plan. Patient receptive, calm, and cooperative. Patient remains safe at this time.

## 2023-01-16 NOTE — Progress Notes (Signed)
   01/16/23 2200  Psych Admission Type (Psych Patients Only)  Admission Status Voluntary  Psychosocial Assessment  Patient Complaints None  Eye Contact Fair  Facial Expression Flat  Affect Flat  Speech Soft  Interaction Minimal;Isolative  Motor Activity Slow;Shuffling  Appearance/Hygiene In scrubs  Behavior Characteristics Cooperative  Mood Depressed  Thought Process  Coherency WDL  Content WDL  Delusions None reported or observed  Perception Hallucinations  Hallucination Auditory  Judgment Impaired  Confusion None  Danger to Self  Current suicidal ideation? Denies  Agreement Not to Harm Self Yes  Description of Agreement verbal  Danger to Others  Danger to Others None reported or observed  Danger to Others Abnormal  Harmful Behavior to others No threats or harm toward other people  Destructive Behavior No threats or harm toward property

## 2023-01-16 NOTE — Plan of Care (Signed)
?  Problem: Nutrition: ?Goal: Adequate nutrition will be maintained ?Outcome: Progressing ?  ?Problem: Coping: ?Goal: Level of anxiety will decrease ?Outcome: Progressing ?  ?Problem: Elimination: ?Goal: Will not experience complications related to bowel motility ?Outcome: Progressing ?Goal: Will not experience complications related to urinary retention ?Outcome: Progressing ?  ?Problem: Pain Managment: ?Goal: General experience of comfort will improve ?Outcome: Progressing ?  ?Problem: Safety: ?Goal: Ability to remain free from injury will improve ?Outcome: Progressing ?  ?

## 2023-01-16 NOTE — Progress Notes (Signed)
   01/15/23 2000  Psych Admission Type (Psych Patients Only)  Admission Status Voluntary  Psychosocial Assessment  Patient Complaints None  Eye Contact Fair  Facial Expression Flat  Affect Flat  Speech Soft  Interaction Minimal  Motor Activity Shuffling;Slow  Appearance/Hygiene In scrubs  Behavior Characteristics Cooperative  Mood Depressed  Thought Process  Coherency WDL  Content WDL  Delusions None reported or observed  Perception Hallucinations  Hallucination Auditory  Judgment Impaired  Confusion None  Danger to Self  Current suicidal ideation? Denies  Danger to Others  Danger to Others None reported or observed

## 2023-01-16 NOTE — Progress Notes (Signed)
[facility] MD Progress Note  01/16/2023 12:03 PM Betty Henderson  MRN:  [id number] Subjective: Betty Henderson is seen on rounds.  She has been compliant with her Gean Birchwood on the 25th of every month.  Nurses report no problems.  She has been pleasant and cooperative on the unit.  Good controls.  I have looked through her history and she has no history of organic brain disease, dementia, or traumatic brain injury.  She has had some workup at [facility] including CT scan that was negative.  She has a very long history of chronic paranoid schizophrenia.  Principal Problem: Schizoaffective disorder, bipolar type (HCC) Diagnosis: Principal Problem:   Schizoaffective disorder, bipolar type (HCC)  Total Time spent with patient: 15 minutes  Past Psychiatric History: Schizophrenia  Past Medical History:  Past Medical History:  Diagnosis Date   Anemia    Arthritis    Chronic pain    Drug-seeking behavior    Malingering    Osteopetrosis    Psychosis (HCC)    Schizoaffective disorder, bipolar type (HCC)     Past Surgical History:  Procedure Laterality Date   MOUTH SURGERY     TUBAL LIGATION     Family History:  Family History  Family history unknown: Yes   Family Psychiatric  History: Unremarkable Social History:  Social History   Substance and Sexual Activity  Alcohol Use Not Currently   Comment: 1 cocktail 3 weeks ago     Social History   Substance and Sexual Activity  Drug Use Not Currently   Types: Cocaine   Comment: states "it's legal though"    Social History   Socioeconomic History   Marital status: Single    Spouse name: Not on file   Number of children: Not on file   Years of education: Not on file   Highest education level: Not on file  Occupational History   Not on file  Tobacco Use   Smoking status: Every Day    Current packs/day: 0.50    Types: Cigarettes   Smokeless tobacco: Never  Vaping Use   Vaping status: Never Used  Substance and Sexual Activity    Alcohol use: Not Currently    Comment: 1 cocktail 3 weeks ago   Drug use: Not Currently    Types: Cocaine    Comment: states "it's legal though"   Sexual activity: Never  Other Topics Concern   Not on file  Social History Narrative   Not on file   Social Determinants of Health   Financial Resource Strain: Not on file  Food Insecurity: Food Insecurity Present (08/22/2022)   Hunger Vital Sign    Worried About Running Out of Food in the Last Year: Sometimes true    Ran Out of Food in the Last Year: Sometimes true  Transportation Needs: Patient Unable To Answer (08/22/2022)   PRAPARE - Transportation    Lack of Transportation (Medical): Patient unable to answer    Lack of Transportation (Non-Medical): Patient unable to answer  Recent Concern: Transportation Needs - Unmet Transportation Needs (06/13/2022)   Received from [facility]   [facility] - Transportation    Lack of Transportation (Medical): Not on file    Lack of Transportation (Non-Medical): Yes  Physical Activity: Not on file  Stress: Not on file  Social Connections: Unknown (08/06/2021)   Received from [facility], [facility]   Social Network    Social Network: Not on file   Additional Social History:  Sleep: Good  Appetite:  Good  Current Medications: Current Facility-Administered Medications  Medication Dose Route Frequency Provider Last Rate Last Admin   acetaminophen (TYLENOL) tablet 650 mg  650 mg Oral Q6H PRN [name], DO   650 mg at 01/15/23 2145   alum & mag hydroxide-simeth (MAALOX/MYLANTA) 200-200-20 MG/5ML suspension 30 mL  30 mL Oral Q4H PRN [name], DO   30 mL at 09/20/22 1142   diphenhydrAMINE (BENADRYL) capsule 50 mg  50 mg Oral Q6H PRN [name], DO   50 mg at 09/29/22 2254   Or   diphenhydrAMINE (BENADRYL) injection 50 mg  50 mg Intramuscular Q6H PRN [name], DO       haloperidol (HALDOL)  tablet 5 mg  5 mg Oral Q6H PRN [name], DO   5 mg at 09/28/22 2149   Or   haloperidol lactate (HALDOL) injection 5 mg  5 mg Intramuscular Q6H PRN [name], DO       ibuprofen (ADVIL) tablet 600 mg  600 mg Oral Q6H PRN [name], [name], MD   600 mg at 01/11/23 1030   LORazepam (ATIVAN) tablet 1 mg  1 mg Oral TID PRN [name], DO   1 mg at 09/27/22 0025   magnesium hydroxide (MILK OF MAGNESIA) suspension 30 mL  30 mL Oral Daily PRN [name], DO   30 mL at 11/08/22 1012   paliperidone (INVEGA SUSTENNA) injection 156 mg  156 mg Intramuscular Q28 days [name], MD   156 mg at 01/15/23 1501   traZODone (DESYREL) tablet 50 mg  50 mg Oral QHS PRN [name], DO   50 mg at 01/15/23 2127    Lab Results: No results found for this or any previous visit (from the past 48 hour(s)).  Blood Alcohol level:  Lab Results  Component Value Date   ETH <10 08/17/2022   ETH <10 01/11/2021    Metabolic Disorder Labs: No results found for: "HGBA1C", "MPG" No results found for: "PROLACTIN" No results found for: "CHOL", "TRIG", "HDL", "CHOLHDL", "VLDL", "LDLCALC"  Physical Findings: AIMS:  , ,  ,  ,    CIWA:    COWS:     Musculoskeletal: Strength & Muscle Tone: within normal limits Gait & Station: normal Patient leans: N/A  Psychiatric Specialty Exam:  Presentation  General Appearance:  Casual; Neat  Eye Contact: Fair  Speech: Slow; Slurred  Speech Volume: Decreased  Handedness: Right   Mood and Affect  Mood: Anxious  Affect: Inappropriate   Thought Process  Thought Processes: Disorganized  Descriptions of Associations:Loose  Orientation:Partial  Thought Content:Illogical; Rumination  History of Schizophrenia/Schizoaffective disorder:No data recorded Duration of Psychotic Symptoms:No data recorded Hallucinations:No data recorded Ideas of Reference:Paranoia  Suicidal Thoughts:No data  recorded Homicidal Thoughts:No data recorded  Sensorium  Memory: Immediate Fair; Remote Poor  Judgment: Poor  Insight: Poor   Executive Functions  Concentration: Poor  Attention Span: Fair  Recall: [employer] of Knowledge: Fair  Language: Fair   Psychomotor Activity  Psychomotor Activity:No data recorded  Assets  Assets: Communication Skills   Sleep  Sleep:No data recorded    Blood pressure 117/69, pulse 80, temperature 98.5 F (36.9 C), resp. rate 16, height 5\' 6"  (1.676 m), weight (P) 82.8 kg, SpO2 93%. Body mass index is 29.46 kg/m (pended).   Treatment Plan Summary: Daily contact with patient to assess and evaluate symptoms and progress in treatment, Medication management, and Plan  continue current medications.  [name], DO 01/16/2023, 12:03 PM

## 2023-01-17 DIAGNOSIS — F250 Schizoaffective disorder, bipolar type: Secondary | ICD-10-CM | POA: Diagnosis not present

## 2023-01-17 NOTE — Group Note (Signed)
Recreation Therapy Group Note   Group Topic:Leisure Education  Group Date: 01/17/2023 Start Time: 1400 End Time: 1445 Facilitators: Rosina Lowenstein, LRT, CTRS Location: Courtyard  Group Description: Leisure. Patients were given the opportunity to play ring toss, play corn hole, or listen to music while sitting in the courtyard getting fresh air and sunlight. Pt identified and conversated about things they enjoy doing in their free time and how they can continue to do that outside of the hospital.  Goal Area(s) Addressed: Patient will learn the definition of "leisure". Patient will practice making a positive decision. Patient will have the opportunity to try a new leisure activity.   Affect/Mood: Appropriate   Participation Level: Active and Engaged   Participation Quality: Independent   Behavior: Appropriate, Calm, and Cooperative   Speech/Thought Process: Coherent   Insight: Good   Judgement: Good   Modes of Intervention: Activity   Patient Response to Interventions:  Attentive, Engaged, Interested , and Receptive   Education Outcome:  Acknowledges education   Clinical Observations/Individualized Feedback: Betty Henderson was active in their participation of session activities and group discussion. Pt identified "do make up and sing gospel" as things she does in her free time. Pt chose to listen to music while outside. Pt interacted well with LRT and peers duration of session.   Plan: Continue to engage patient in RT group sessions 2-3x/week.   Rosina Lowenstein, LRT, CTRS 01/17/2023 3:07 PM

## 2023-01-17 NOTE — Progress Notes (Signed)
Recreation Therapy Notes  LRT provided Betty Henderson with fingernail care on 01/16/23.     Yancey Pedley E Shacoya Burkhammer 01/17/2023 7:44 AM

## 2023-01-17 NOTE — Plan of Care (Signed)
  Problem: Education: Goal: Knowledge of General Education information will improve Description Including pain rating scale, medication(s)/side effects and non-pharmacologic comfort measures Outcome: Progressing   

## 2023-01-17 NOTE — Progress Notes (Signed)
[facility] MD Progress Note  01/17/2023 1:12 PM Betty Henderson  MRN:  [id number] Subjective: Betty Henderson is seen on rounds.  No news.  Nurses report no issues.  She has been pleasant and cooperative.  She has been compliant.  She has been in good controls.  No complaints. Principal Problem: Schizoaffective disorder, bipolar type (HCC) Diagnosis: Principal Problem:   Schizoaffective disorder, bipolar type (HCC)  Total Time spent with patient: 15 minutes  Past Psychiatric History: Schizoaffective disorder  Past Medical History:  Past Medical History:  Diagnosis Date   Anemia    Arthritis    Chronic pain    Drug-seeking behavior    Malingering    Osteopetrosis    Psychosis (HCC)    Schizoaffective disorder, bipolar type (HCC)     Past Surgical History:  Procedure Laterality Date   MOUTH SURGERY     TUBAL LIGATION     Family History:  Family History  Family history unknown: Yes   Family Psychiatric  History: Unremarkable Social History:  Social History   Substance and Sexual Activity  Alcohol Use Not Currently   Comment: 1 cocktail 3 weeks ago     Social History   Substance and Sexual Activity  Drug Use Not Currently   Types: Cocaine   Comment: states "it's legal though"    Social History   Socioeconomic History   Marital status: Single    Spouse name: Not on file   Number of children: Not on file   Years of education: Not on file   Highest education level: Not on file  Occupational History   Not on file  Tobacco Use   Smoking status: Every Day    Current packs/day: 0.50    Types: Cigarettes   Smokeless tobacco: Never  Vaping Use   Vaping status: Never Used  Substance and Sexual Activity   Alcohol use: Not Currently    Comment: 1 cocktail 3 weeks ago   Drug use: Not Currently    Types: Cocaine    Comment: states "it's legal though"   Sexual activity: Never  Other Topics Concern   Not on file  Social History Narrative   Not on file   Social Determinants  of Health   Financial Resource Strain: Not on file  Food Insecurity: Food Insecurity Present (08/22/2022)   Hunger Vital Sign    Worried About Running Out of Food in the Last Year: Sometimes true    Ran Out of Food in the Last Year: Sometimes true  Transportation Needs: Patient Unable To Answer (08/22/2022)   PRAPARE - Transportation    Lack of Transportation (Medical): Patient unable to answer    Lack of Transportation (Non-Medical): Patient unable to answer  Recent Concern: Transportation Needs - Unmet Transportation Needs (06/13/2022)   Received from [facility]   [facility] - Transportation    Lack of Transportation (Medical): Not on file    Lack of Transportation (Non-Medical): Yes  Physical Activity: Not on file  Stress: Not on file  Social Connections: Unknown (08/06/2021)   Received from [facility]   Social Network    Social Network: Not on file   Additional Social History:                         Sleep: Good  Appetite:  Good  Current Medications: Current Facility-Administered Medications  Medication Dose Route Frequency Provider Last Rate Last Admin   acetaminophen (TYLENOL) tablet 650 mg  650 mg Oral Q6H PRN [name], DO   650 mg at 01/16/23 2112   alum & mag hydroxide-simeth (MAALOX/MYLANTA) 200-200-20 MG/5ML suspension 30 mL  30 mL Oral Q4H PRN [name], DO   30 mL at 09/20/22 1142   diphenhydrAMINE (BENADRYL) capsule 50 mg  50 mg Oral Q6H PRN [name], DO   50 mg at 09/29/22 2254   Or   diphenhydrAMINE (BENADRYL) injection 50 mg  50 mg Intramuscular Q6H PRN [name], DO       haloperidol (HALDOL) tablet 5 mg  5 mg Oral Q6H PRN [name], DO   5 mg at 09/28/22 2149   Or   haloperidol lactate (HALDOL) injection 5 mg  5 mg Intramuscular Q6H PRN [name], DO       ibuprofen (ADVIL) tablet 600 mg  600 mg Oral Q6H PRN [name], [name], MD   600  mg at 01/11/23 1030   LORazepam (ATIVAN) tablet 1 mg  1 mg Oral TID PRN [name], DO   1 mg at 09/27/22 0025   magnesium hydroxide (MILK OF MAGNESIA) suspension 30 mL  30 mL Oral Daily PRN [name], DO   30 mL at 11/08/22 1012   paliperidone (INVEGA SUSTENNA) injection 156 mg  156 mg Intramuscular Q28 days [name], MD   156 mg at 01/15/23 1501   traZODone (DESYREL) tablet 50 mg  50 mg Oral QHS PRN [name], DO   50 mg at 01/16/23 2112    Lab Results: No results found for this or any previous visit (from the past 48 hour(s)).  Blood Alcohol level:  Lab Results  Component Value Date   ETH <10 08/17/2022   ETH <10 01/11/2021    Metabolic Disorder Labs: No results found for: "HGBA1C", "MPG" No results found for: "PROLACTIN" No results found for: "CHOL", "TRIG", "HDL", "CHOLHDL", "VLDL", "LDLCALC"  Physical Findings: AIMS:  , ,  ,  ,    CIWA:    COWS:     Musculoskeletal: Strength & Muscle Tone: within normal limits Gait & Station: normal Patient leans: N/A  Psychiatric Specialty Exam:  Presentation  General Appearance:  Casual; Neat  Eye Contact: Fair  Speech: Slow; Slurred  Speech Volume: Decreased  Handedness: Right   Mood and Affect  Mood: Anxious  Affect: Inappropriate   Thought Process  Thought Processes: Disorganized  Descriptions of Associations:Loose  Orientation:Partial  Thought Content:Illogical; Rumination  History of Schizophrenia/Schizoaffective disorder:No data recorded Duration of Psychotic Symptoms:No data recorded Hallucinations:No data recorded Ideas of Reference:Paranoia  Suicidal Thoughts:No data recorded Homicidal Thoughts:No data recorded  Sensorium  Memory: Immediate Fair; Remote Poor  Judgment: Poor  Insight: Poor   Executive Functions  Concentration: Poor  Attention Span: Fair  Recall: [employer] of  Knowledge: Fair  Language: Fair   Psychomotor Activity  Psychomotor Activity:No data recorded  Assets  Assets: Communication Skills   Sleep  Sleep:No data recorded    Blood pressure 111/64, pulse 72, temperature 98.5 F (36.9 C), resp. rate 15, height 5\' 6"  (1.676 m), weight (P) 82.8 kg, SpO2 98%. Body mass index is 29.46 kg/m (pended).   Treatment Plan Summary: Daily contact with patient to assess and evaluate symptoms and progress in treatment, Medication management, and Plan continue current medication.  Timothee Gali Tresea Mall, DO 01/17/2023, 1:12 PM

## 2023-01-17 NOTE — Progress Notes (Signed)
   01/17/23 3474  15 Minute Checks  Location Bedroom  Visual Appearance Calm  Behavior Sleeping  Sleep (Behavioral Health Patients Only)  Calculate sleep? (Click Yes once per 24 hr at 0600 safety check) Yes  Documented sleep last 24 hours 8.25

## 2023-01-17 NOTE — Progress Notes (Signed)
I assumed care for Betty Henderson at about 08:00.  She was resting in bed, alert, later seen after breakfast. No changes in condition from baseline, vital signs WNL, no prns needed or requested for this shift, she has remained self isolative to her room most of the shift, seen occasionally responding to internal stimuli though she denies the same when I asked her. She otherwise denies any avh/hi/si. She is being monitored as ordered.

## 2023-01-17 NOTE — Progress Notes (Signed)
   01/17/23 1500  Spiritual Encounters  Type of Visit Follow up  Care provided to: Patient  Referral source Chaplain assessment   Chaplain was outside with patients during recreational therapy. Patient shared with the chaplain that she wants to leave and is upset that she has not been able to. Patient asked the chaplain to talk to her SW and tell her to call her big brother. Chaplain met with SW and talked to her about reaching out to the patients brother. SW said that the patient has not given her his number or his name. SW stated that she would talk to the patient and try and contact her brother.

## 2023-01-18 DIAGNOSIS — F250 Schizoaffective disorder, bipolar type: Secondary | ICD-10-CM | POA: Diagnosis not present

## 2023-01-18 NOTE — Plan of Care (Signed)
D: Pt alert and oriented. Pt denies experiencing any anxiety/depression at this time. Pt reports experiencing bilat leg pain at this time, prn medication given. Pt denies experiencing any SI/HI, or AVH at this time, however can be observed responding to internal stimuli at times in her room.   A: Support and encouragement provided. Frequent verbal contact made. Routine safety checks conducted q15 minutes.   R: Pt verbally contracts for safety at this time. Pt compliant with treatment plan. Pt interacts well but minimally with others on the unit, mostly self isolating to room with exception to meals and groups. Pt remains safe at this time. Plan of care ongoing.  Problem: Nutrition: Goal: Adequate nutrition will be maintained Outcome: Progressing   Problem: Coping: Goal: Level of anxiety will decrease Outcome: Progressing

## 2023-01-18 NOTE — Group Note (Signed)
LCSW Group Therapy Note  Group Date: 01/18/2023 Start Time: 1308 End Time: 1345   Type of Therapy and Topic:  Group Therapy - Healthy vs Unhealthy Coping Skills  Participation Level:  Did Not Attend   Description of Group The focus of this group was to determine what unhealthy coping techniques typically are used by group members and what healthy coping techniques would be helpful in coping with various problems. Patients were guided in becoming aware of the differences between healthy and unhealthy coping techniques. Patients were asked to identify 2-3 healthy coping skills they would like to learn to use more effectively.  Therapeutic Goals Patients learned that coping is what human beings do all day long to deal with various situations in their lives Patients defined and discussed healthy vs unhealthy coping techniques Patients identified their preferred coping techniques and identified whether these were healthy or unhealthy Patients determined 2-3 healthy coping skills they would like to become more familiar with and use more often. Patients provided support and ideas to each other   Summary of Patient Progress:  The patient did not attend group.     Marshell Levan, LCSWA 01/18/2023  2:09 PM

## 2023-01-18 NOTE — Progress Notes (Signed)
D- Patient alert and oriented. Pt received in bed. Pt is incont of urine. Bed linen changed and replaced. Pt remains stable. Denies anxiety and depression. Denies SI, HI, AVH, and pain.  A- Scheduled medications administered to patient, per MD orders.PRN given for insomnia.  Support and encouragement provided.  Routine safety checks conducted every 15 minutes.  Patient informed to notify staff with problems or concerns. R- No adverse drug reactions noted. Patient contracts for safety at this time. Patient compliant with medications and treatment plan. Patient receptive, calm, and cooperative. Limited interaction with peers and staff. Isolates in room except for meds/meals.  Patient remains safe at this time.

## 2023-01-18 NOTE — Group Note (Signed)
Date:  01/18/2023 Time:  2:54 PM  Group Topic/Focus:  Self Care:   The focus of this group is to help patients understand the importance of self-care in order to improve or restore emotional, physical, spiritual, interpersonal, and financial health.    Participation Level:  Active  Participation Quality:  Appropriate  Affect:  Appropriate  Cognitive:  Appropriate  Insight: Appropriate  Engagement in Group:  Engaged  Modes of Intervention:  Activity  Additional Comments:    Betty Henderson 01/18/2023, 2:54 PM

## 2023-01-18 NOTE — Group Note (Signed)
Date:  01/18/2023 Time:  12:03 PM  Group Topic/Focus:  Emotional Education:   The focus of this group is to discuss what feelings/emotions are, and how they are experienced.    Participation Level:  Active  Participation Quality:  Appropriate  Affect:  Appropriate  Cognitive:  Appropriate  Insight: Appropriate  Engagement in Group:  Engaged  Modes of Intervention:  Activity  Additional Comments:    Auden Tatar 01/18/2023, 12:03 PM

## 2023-01-18 NOTE — Plan of Care (Signed)

## 2023-01-18 NOTE — Progress Notes (Signed)
   01/18/23 0645  15 Minute Checks  Location Bedroom  Visual Appearance Calm  Behavior Sleeping  Sleep (Behavioral Health Patients Only)  Calculate sleep? (Click Yes once per 24 hr at 0600 safety check) Yes  Documented sleep last 24 hours 7.25

## 2023-01-18 NOTE — BH IP Treatment Plan (Signed)
Interdisciplinary Treatment and Diagnostic Plan Update  01/18/2023 Time of Session: 2:20 pm Betty Henderson MRN: [id number]  Principal Diagnosis: Schizoaffective disorder, bipolar type [facility])  Secondary Diagnoses: Principal Problem:   Schizoaffective disorder, bipolar type (HCC)   Current Medications:  Current Facility-Administered Medications  Medication Dose Route Frequency Provider Last Rate Last Admin   acetaminophen (TYLENOL) tablet 650 mg  650 mg Oral Q6H PRN [name], DO   650 mg at 01/16/23 2112   alum & mag hydroxide-simeth (MAALOX/MYLANTA) 200-200-20 MG/5ML suspension 30 mL  30 mL Oral Q4H PRN [name], DO   30 mL at 09/20/22 1142   diphenhydrAMINE (BENADRYL) capsule 50 mg  50 mg Oral Q6H PRN [name], DO   50 mg at 09/29/22 2254   Or   diphenhydrAMINE (BENADRYL) injection 50 mg  50 mg Intramuscular Q6H PRN [name], DO       haloperidol (HALDOL) tablet 5 mg  5 mg Oral Q6H PRN [name], DO   5 mg at 09/28/22 2149   Or   haloperidol lactate (HALDOL) injection 5 mg  5 mg Intramuscular Q6H PRN [name], DO       ibuprofen (ADVIL) tablet 600 mg  600 mg Oral Q6H PRN [name], [name], MD   600 mg at 01/18/23 4098   LORazepam (ATIVAN) tablet 1 mg  1 mg Oral TID PRN [name], DO   1 mg at 09/27/22 0025   magnesium hydroxide (MILK OF MAGNESIA) suspension 30 mL  30 mL Oral Daily PRN [name], DO   30 mL at 11/08/22 1012   paliperidone (INVEGA SUSTENNA) injection 156 mg  156 mg Intramuscular Q28 days [name], MD   156 mg at 01/15/23 1501   traZODone (DESYREL) tablet 50 mg  50 mg Oral QHS PRN [name], DO   50 mg at 01/17/23 2153   PTA Medications: Medications Prior to Admission  Medication Sig Dispense Refill Last Dose   ondansetron (ZOFRAN) 4 MG tablet Take 1 tablet (4 mg total) by mouth every 8 (eight) hours as needed for nausea or  vomiting. (Patient not taking: Reported on 08/17/2022) 20 tablet 0     Patient Stressors: Financial difficulties   Health problems   Medication change or noncompliance    Patient Strengths: Ability for insight   Treatment Modalities: Medication Management, Group therapy, Case management,  1 to 1 session with clinician, Psychoeducation, Recreational therapy.   Physician Treatment Plan for Primary Diagnosis: Schizoaffective disorder, bipolar type (HCC) Long Term Goal(s): Improvement in symptoms so as ready for discharge   Short Term Goals: Ability to identify changes in lifestyle to reduce recurrence of condition will improve Ability to verbalize feelings will improve Ability to disclose and discuss suicidal ideas Ability to demonstrate self-control will improve Ability to identify and develop effective coping behaviors will improve Ability to maintain clinical measurements within normal limits will improve Compliance with prescribed medications will improve Ability to identify triggers associated with substance abuse/mental health issues will improve  Medication Management: Evaluate patient's response, side effects, and tolerance of medication regimen.  Therapeutic Interventions: 1 to 1 sessions, Unit Group sessions and Medication administration.  Evaluation of Outcomes: Progressing  Physician Treatment Plan for Secondary Diagnosis: Principal Problem:   Schizoaffective disorder, bipolar type (HCC)  Long Term Goal(s): Improvement in symptoms so as ready for discharge   Short Term Goals: Ability to identify changes in lifestyle to reduce recurrence of condition will  improve Ability to verbalize feelings will improve Ability to disclose and discuss suicidal ideas Ability to demonstrate self-control will improve Ability to identify and develop effective coping behaviors will improve Ability to maintain clinical measurements within normal limits will improve Compliance with  prescribed medications will improve Ability to identify triggers associated with substance abuse/mental health issues will improve     Medication Management: Evaluate patient's response, side effects, and tolerance of medication regimen.  Therapeutic Interventions: 1 to 1 sessions, Unit Group sessions and Medication administration.  Evaluation of Outcomes: Progressing   RN Treatment Plan for Primary Diagnosis: Schizoaffective disorder, bipolar type (HCC) Long Term Goal(s): Knowledge of disease and therapeutic regimen to maintain health will improve  Short Term Goals: Ability to remain free from injury will improve, Ability to verbalize frustration and anger appropriately will improve, Ability to demonstrate self-control, Ability to participate in decision making will improve, Ability to verbalize feelings will improve, Ability to disclose and discuss suicidal ideas, Ability to identify and develop effective coping behaviors will improve, and Compliance with prescribed medications will improve  Medication Management: RN will administer medications as ordered by provider, will assess and evaluate patient's response and provide education to patient for prescribed medication. RN will report any adverse and/or side effects to prescribing provider.  Therapeutic Interventions: 1 on 1 counseling sessions, Psychoeducation, Medication administration, Evaluate responses to treatment, Monitor vital signs and CBGs as ordered, Perform/monitor CIWA, COWS, AIMS and Fall Risk screenings as ordered, Perform wound care treatments as ordered.  Evaluation of Outcomes: Progressing   LCSW Treatment Plan for Primary Diagnosis: Schizoaffective disorder, bipolar type (HCC) Long Term Goal(s): Safe transition to appropriate next level of care at discharge, Engage patient in therapeutic group addressing interpersonal concerns.  Short Term Goals: Engage patient in aftercare planning with referrals and resources, Increase  social support, Increase ability to appropriately verbalize feelings, Increase emotional regulation, and Facilitate acceptance of mental health diagnosis and concerns  Therapeutic Interventions: Assess for all discharge needs, 1 to 1 time with Social worker, Explore available resources and support systems, Assess for adequacy in community support network, Educate family and significant other(s) on suicide prevention, Complete Psychosocial Assessment, Interpersonal group therapy.  Evaluation of Outcomes: Progressing   Progress in Treatment: Attending groups: Yes. Participating in groups: Yes. Taking medication as prescribed: Yes. Toleration medication: Yes. Family/Significant other contact made:  Zamara Quiroga, son, [phone]  Patient understands diagnosis: Yes. Discussing patient identified problems/goals with staff: Yes. Medical problems stabilized or resolved: Yes. Denies suicidal/homicidal ideation: Yes. Issues/concerns per patient self-inventory: No. Other: none  New problem(s) identified: No, Describe:  none Updates 12/12/22: No changes made at this time Updates 12/17/22: No changes made at this time Update 12/24/22 No changes at this time. Update 12/29/2022: No changes at this time. Update 01/03/23: No changes at this time Update 01/08/23: No changes at this time Update 01/13/23: No changes at this time Update 01/18/23: None at this time.        New Short Term/Long Term Goal(s): Update 6/30: none at this time. Update 09/27/2022:  No changes at this time.  Update 10/02/2022:  No changes at this time. Update 10/07/22: No changes at this time 10/12/22: No changes at this time Update 10/18/22: No changes at this time Update 10/23/22: No changes at this time Update 10/28/22: No changes at this time Update 11/07/22: No changes at this time Update 11/12/22: No changes at this time  Update 11/17/22: None at this time. Update 11/22/22: None at this time. Update 11/27/22 No  changes at this time. Update  12/02/22 No changes at this time  Update 12/07/22: None at this time. Updates 12/12/22: No changes made at this time Updates 12/17/22: No changes made at this time Update 12/24/22 No changes at this time. Update 12/29/2022: No changes at this time. Update 01/03/23: No changes at this time Update 01/08/23: No changes at this time Update 01/13/23: No changes at this time   Update 01/18/23: No changes at this time.            Patient Goals:  Update 6/30: none at this time. Update 09/27/2022:  No changes at this time. Update 10/02/2022:  No changes at this time. Update 10/07/22: No changes at this time 10/12/22: No changes at this time Update 10/18/22: No changes at this time Update 10/23/22: No changes at this time Update 10/28/22: No changes at this time Update 11/07/22: No changes at this time Update 11/12/22: No changes at this time Update 11/17/22: None at this time. Update 11/22/22: None at this time. Update 11/27/22 No changes at this time Update 12/02/22 No changes at this time   Update 12/07/22: None at this time. Updates 12/12/22: No changes made at this time Updates 12/17/22: No changes made at this time Update 12/24/22 No changes at this time. Update 12/29/2022: No changes at this time. Update 01/03/23: No changes at this time Update 01/08/23: No changes at this time Update 01/13/23: No changes at this time   Update 01/18/23: No changes at this time.          Discharge Plan or Barriers: Update 6/30: APS report has been made and patient is being investigated for guardianship needs.  Remains homeless with limited supports. Update 09/27/2022:  No changes at this time.  Update 10/02/2022:  Patient remains safe on the unit at this time.  Patient remains psychotic at this time.  APS report has been made, however, no follow up from the caseworker on her case.  No safe discharge identified.  CSW has requested that application for Medicaid and disability be completed.   Update 10/07/22: No changes at this time 10/12/22: No changes at this  time Update 10/18/22: No changes at this time Update 10/23/22: No changes at this time Update 10/23/22: No changes at this time Update 10/28/22: According to [facility], pt's caseworker at DSS, the petition was filed for guardianship on 10/21/22, now awaiting for the petition to be approvedUpdate 11/07/22: No changes at this time Update 11/12/22: CSW sent over patients information to [facility] and [facility], awaiting a response. CSW awaiting word from DSS regarding pt's petition and what agency will become her guardian. Update 11/17/22: No changes at this time. Update 11/22/22: CSW has sent patients information to multiple facilities that were suggested by leadership, pt has been denied from the facility or the facility does not respond. CSW continues to send pt's information to nursing homes. Update 11/27/22 CSW contacted DSS to inquire about the status of guardianship. No response at this time Update 12/02/22 Supervisor Loraine Leriche contacted DSS who stated they are not taking pt on for guardianship but that Quincy Carnes will be present tomorrow to do a visit with social worker and pt and that pt's son will be signing documentation for placement for the pt. Update 12/07/22: [facility] DSS has declined to take guardianship. Son, Casimiro Needle is to step into a more present role in deciding placement. A conversation is still needed. Updates 12/12/22: CSW staffed case with leadership and they state that a family  meeting must happen with pt's son. CSW contacted pt's financial navigator and her DSS worker so that they may be present on the call and are able to speak to the efforts they have made to secure pt funding and housing. CSW waiting on responses from both, leadership is aware. Updates 12/17/22: CSW awaiting updates from both [facility] DSS and [facility] financial navigators as to status of pt's applications for Medicaid/[employer] and Disability, respectively. Update 12/24/22 CSW sent email regarding family  meeting per request of supervisors. CSW awaiting responses from all parties to schedule family meeting. Update: 12/29/2022 No changes at this time. Update 01/03/23: CSW continues to await responses on the scheduling of the family meeting. CSW received call from Primitivo Gauze at [employer], CSW sent FL2 to [place] to aide in pt's discharge planning. Update 01/08/23: CSW has reached out again to leadership and financial counseling. CSW continues to await a response. CSW called Sharlee Blew, awaiting a response for status of pt's placement options. Update 01/13/23: Pt is to meet with psychologist tomorrow 10/22 to complete capacity testing so that DSS can retry for guardianship  Update 01/18/23: The patient is still awaiting placement.         Reason for Continuation of Hospitalization: Hallucinations Mania Medication stabilization   Estimated Length of Stay:  Update 6/30: 1-7 days Update 09/27/2022:  TBD Update 10/02/2022:  No changes at this time. UpdaUpdate 10/18/22: No changes at this time te 10/07/22: No changes at this time Update 10/18/22: No changes at this time Update 10/23/22: No changes at this time Update 10/28/22: No changes at this time Update 11/07/22: No changes at this time Update 11/12/22: No changes at this time  Update 11/17/22: No changes at this time. Update 11/22/22: None at this time. Update 11/27/22 No changes at this time. Update 12/02/22 No changes at this time  Update 12/07/22: None at this time. Updates 12/12/22: No changes made at this time Updates 12/17/22: TBD. Update 12/29/2022: No changes at this time. Update 01/03/23: TBDUpdate 01/08/23: TBD Update 01/13/23: TBD  Update 01/18/23: No changes at this time.       Last 3 [place] Suicide Severity Risk Score: Flowsheet Row Admission (Current) from 08/22/2022 in [facility] BE[facility]IC[facility] ED from 08/17/2022 in [facility] Emergency Department at [facility] ED from 08/12/2022 in [facility] Emergency Department at [facility]   C-SSRS RISK CATEGORY Low Risk No Risk No Risk       Last [facility] 2/9 Scores:     No data to display          Scribe for Treatment Team: Marshell Levan, LCSW 01/18/2023 2:20 PM

## 2023-01-18 NOTE — Progress Notes (Addendum)
[facility] MD Progress Note  01/18/2023 11:47 AM Betty Henderson  MRN:  [id number] Subjective: Betty Henderson is seen on rounds.  Still awaiting placement.  She has been in good controls.  No problems.  Nurses report no issues.  She has no complaints. Principal Problem: Schizoaffective disorder, bipolar type (HCC) Diagnosis: Principal Problem:   Schizoaffective disorder, bipolar type (HCC)  Total Time spent with patient: 15 minutes  Past Psychiatric History: Schizoaffective disorder  Past Medical History:  Past Medical History:  Diagnosis Date   Anemia    Arthritis    Chronic pain    Drug-seeking behavior    Malingering    Osteopetrosis    Psychosis (HCC)    Schizoaffective disorder, bipolar type (HCC)     Past Surgical History:  Procedure Laterality Date   MOUTH SURGERY     TUBAL LIGATION     Family History:  Family History  Family history unknown: Yes   Family Psychiatric  History: Unremarkable Social History:  Social History   Substance and Sexual Activity  Alcohol Use Not Currently   Comment: 1 cocktail 3 weeks ago     Social History   Substance and Sexual Activity  Drug Use Not Currently   Types: Cocaine   Comment: states "it's legal though"    Social History   Socioeconomic History   Marital status: Single    Spouse name: Not on file   Number of children: Not on file   Years of education: Not on file   Highest education level: Not on file  Occupational History   Not on file  Tobacco Use   Smoking status: Every Day    Current packs/day: 0.50    Types: Cigarettes   Smokeless tobacco: Never  Vaping Use   Vaping status: Never Used  Substance and Sexual Activity   Alcohol use: Not Currently    Comment: 1 cocktail 3 weeks ago   Drug use: Not Currently    Types: Cocaine    Comment: states "it's legal though"   Sexual activity: Never  Other Topics Concern   Not on file  Social History Narrative   Not on file   Social Determinants of Health   Financial  Resource Strain: Not on file  Food Insecurity: Food Insecurity Present (08/22/2022)   Hunger Vital Sign    Worried About Running Out of Food in the Last Year: Sometimes true    Ran Out of Food in the Last Year: Sometimes true  Transportation Needs: Patient Unable To Answer (08/22/2022)   PRAPARE - Transportation    Lack of Transportation (Medical): Patient unable to answer    Lack of Transportation (Non-Medical): Patient unable to answer  Recent Concern: Transportation Needs - Unmet Transportation Needs (06/13/2022)   Received from [facility]   [facility] - Transportation    Lack of Transportation (Medical): Not on file    Lack of Transportation (Non-Medical): Yes  Physical Activity: Not on file  Stress: Not on file  Social Connections: Unknown (08/06/2021)   Received from [facility]   Social Network    Social Network: Not on file   Additional Social History:                         Sleep: Good  Appetite:  Good  Current Medications: Current Facility-Administered Medications  Medication Dose Route Frequency Provider Last Rate Last Admin   acetaminophen (TYLENOL) tablet 650 mg  650 mg Oral Q6H PRN  [name], DO   650 mg at 01/16/23 2112   alum & mag hydroxide-simeth (MAALOX/MYLANTA) 200-200-20 MG/5ML suspension 30 mL  30 mL Oral Q4H PRN [name], DO   30 mL at 09/20/22 1142   diphenhydrAMINE (BENADRYL) capsule 50 mg  50 mg Oral Q6H PRN [name], DO   50 mg at 09/29/22 2254   Or   diphenhydrAMINE (BENADRYL) injection 50 mg  50 mg Intramuscular Q6H PRN [name], DO       haloperidol (HALDOL) tablet 5 mg  5 mg Oral Q6H PRN [name], DO   5 mg at 09/28/22 2149   Or   haloperidol lactate (HALDOL) injection 5 mg  5 mg Intramuscular Q6H PRN [name], DO       ibuprofen (ADVIL) tablet 600 mg  600 mg Oral Q6H PRN [name], [name], MD   600 mg at 01/18/23 7846    LORazepam (ATIVAN) tablet 1 mg  1 mg Oral TID PRN [name], DO   1 mg at 09/27/22 0025   magnesium hydroxide (MILK OF MAGNESIA) suspension 30 mL  30 mL Oral Daily PRN [name], DO   30 mL at 11/08/22 1012   paliperidone (INVEGA SUSTENNA) injection 156 mg  156 mg Intramuscular Q28 days [name], MD   156 mg at 01/15/23 1501   traZODone (DESYREL) tablet 50 mg  50 mg Oral QHS PRN [name], DO   50 mg at 01/17/23 2153    Lab Results: No results found for this or any previous visit (from the past 48 hour(s)).  Blood Alcohol level:  Lab Results  Component Value Date   ETH <10 08/17/2022   ETH <10 01/11/2021    Metabolic Disorder Labs: No results found for: "HGBA1C", "MPG" No results found for: "PROLACTIN" No results found for: "CHOL", "TRIG", "HDL", "CHOLHDL", "VLDL", "LDLCALC"  Physical Findings: AIMS:  , ,  ,  ,    CIWA:    COWS:     Musculoskeletal: Strength & Muscle Tone: within normal limits Gait & Station: normal Patient leans: N/A  Psychiatric Specialty Exam:  Presentation  General Appearance:  Casual; Neat  Eye Contact: Fair  Speech: Slow; Slurred  Speech Volume: Decreased  Handedness: Right   Mood and Affect  Mood: Anxious  Affect: Inappropriate   Thought Process  Thought Processes: Disorganized  Descriptions of Associations:Loose  Orientation:Partial  Thought Content:Illogical; Rumination  History of Schizophrenia/Schizoaffective disorder:No data recorded Duration of Psychotic Symptoms:No data recorded Hallucinations:No data recorded Ideas of Reference:Paranoia  Suicidal Thoughts:No data recorded Homicidal Thoughts:No data recorded  Sensorium  Memory: Immediate Fair; Remote Poor  Judgment: Poor  Insight: Poor   Executive Functions  Concentration: Poor  Attention Span: Fair  Recall: [employer] of Knowledge: Fair  Language: Fair   Psychomotor Activity   Psychomotor Activity:No data recorded  Assets  Assets: Communication Skills   Sleep  Sleep:No data recorded   Blood pressure (!) 132/54, pulse (!) 109, temperature 98 F (36.7 C), resp. rate 16, height 5\' 6"  (1.676 m), weight (P) 82.8 kg, SpO2 100%. Body mass index is 29.46 kg/m (pended).   Treatment Plan Summary: Daily contact with patient to assess and evaluate symptoms and progress in treatment, Medication management, and Plan continue current medications.  Gean Birchwood given on 01/15/2023.  [name], DO 01/18/2023, 11:47 AM

## 2023-01-19 DIAGNOSIS — F250 Schizoaffective disorder, bipolar type: Secondary | ICD-10-CM | POA: Diagnosis not present

## 2023-01-19 NOTE — Group Note (Signed)
Date:  01/19/2023 Time:  10:21 PM  Group Topic/Focus:  Making Healthy Choices:   The focus of this group is to help patients identify negative/unhealthy choices they were using prior to admission and identify positive/healthier coping strategies to replace them upon discharge.    Participation Level:  Active  Participation Quality:  Appropriate  Affect:  Appropriate  Cognitive:  Appropriate  Insight: Improving  Engagement in Group:  Engaged  Modes of Intervention:  Discussion  Additional Comments:    [name] 01/19/2023, 10:21 PM

## 2023-01-19 NOTE — Plan of Care (Signed)
D: Pt alert and oriented. Pt denies experiencing any anxiety/depression at this time. Pt denies experiencing any pain at this time. Pt denies experiencing any SI/HI, or AVH at this time however, has been observed responding to internal stimuli in her room.   A: Scheduled medications administered to pt, per MD orders. Support and encouragement provided. Frequent verbal contact made. Routine safety checks conducted q15 minutes.   R: No adverse drug reactions noted. Pt verbally contracts for safety at this time. Pt compliant with medications and treatment plan. Pt interacts well but minimally with others on the unit. Pt remains safe at this time. Plan of care ongoing.  Problem: Education: Goal: Knowledge of General Education information will improve Description: Including pain rating scale, medication(s)/side effects and non-pharmacologic comfort measures Outcome: Progressing   Problem: Nutrition: Goal: Adequate nutrition will be maintained Outcome: Progressing

## 2023-01-19 NOTE — Progress Notes (Signed)
   01/19/23 0630  15 Minute Checks  Location Bedroom  Visual Appearance Calm  Behavior Sleeping  Sleep (Behavioral Health Patients Only)  Calculate sleep? (Click Yes once per 24 hr at 0600 safety check) Yes  Documented sleep last 24 hours 8.25

## 2023-01-19 NOTE — Progress Notes (Signed)
[facility] MD Progress Note  01/19/2023 12:05 PM Rayel Schirtzinger  MRN:  [id number] Subjective: Betty Henderson is seen on rounds.  Nurses report no issues.  She has no complaints.  She received her [place] on the 23rd.  No problems.  No side effects. Principal Problem: Schizoaffective disorder, bipolar type (HCC) Diagnosis: Principal Problem:   Schizoaffective disorder, bipolar type (HCC)  Total Time spent with patient: 15 minutes  Past Psychiatric History: Schizoaffective disorder  Past Medical History:  Past Medical History:  Diagnosis Date   Anemia    Arthritis    Chronic pain    Drug-seeking behavior    Malingering    Osteopetrosis    Psychosis (HCC)    Schizoaffective disorder, bipolar type (HCC)     Past Surgical History:  Procedure Laterality Date   MOUTH SURGERY     TUBAL LIGATION     Family History:  Family History  Family history unknown: Yes   Family Psychiatric  History: Unremarkable Social History:  Social History   Substance and Sexual Activity  Alcohol Use Not Currently   Comment: 1 cocktail 3 weeks ago     Social History   Substance and Sexual Activity  Drug Use Not Currently   Types: Cocaine   Comment: states "it's legal though"    Social History   Socioeconomic History   Marital status: Single    Spouse name: Not on file   Number of children: Not on file   Years of education: Not on file   Highest education level: Not on file  Occupational History   Not on file  Tobacco Use   Smoking status: Every Day    Current packs/day: 0.50    Types: Cigarettes   Smokeless tobacco: Never  Vaping Use   Vaping status: Never Used  Substance and Sexual Activity   Alcohol use: Not Currently    Comment: 1 cocktail 3 weeks ago   Drug use: Not Currently    Types: Cocaine    Comment: states "it's legal though"   Sexual activity: Never  Other Topics Concern   Not on file  Social History Narrative   Not on file   Social Determinants of Health    Financial Resource Strain: Not on file  Food Insecurity: Food Insecurity Present (08/22/2022)   Hunger Vital Sign    Worried About Running Out of Food in the Last Year: Sometimes true    Ran Out of Food in the Last Year: Sometimes true  Transportation Needs: Patient Unable To Answer (08/22/2022)   PRAPARE - Transportation    Lack of Transportation (Medical): Patient unable to answer    Lack of Transportation (Non-Medical): Patient unable to answer  Recent Concern: Transportation Needs - Unmet Transportation Needs (06/13/2022)   Received from [facility], [facility]   [facility] - Transportation    Lack of Transportation (Medical): Not on file    Lack of Transportation (Non-Medical): Yes  Physical Activity: Not on file  Stress: Not on file  Social Connections: Unknown (08/06/2021)   Received from [facility], [facility]   Social Network    Social Network: Not on file   Additional Social History:                         Sleep: Good  Appetite:  Good  Current Medications: Current Facility-Administered Medications  Medication Dose Route Frequency Provider Last Rate Last Admin   acetaminophen (TYLENOL) tablet 650 mg  650 mg Oral  Q6H PRN [name], DO   650 mg at 01/16/23 2112   alum & mag hydroxide-simeth (MAALOX/MYLANTA) 200-200-20 MG/5ML suspension 30 mL  30 mL Oral Q4H PRN [name], DO   30 mL at 09/20/22 1142   diphenhydrAMINE (BENADRYL) capsule 50 mg  50 mg Oral Q6H PRN [name], DO   50 mg at 09/29/22 2254   Or   diphenhydrAMINE (BENADRYL) injection 50 mg  50 mg Intramuscular Q6H PRN [name], DO       haloperidol (HALDOL) tablet 5 mg  5 mg Oral Q6H PRN [name], DO   5 mg at 09/28/22 2149   Or   haloperidol lactate (HALDOL) injection 5 mg  5 mg Intramuscular Q6H PRN [name], DO       ibuprofen (ADVIL) tablet 600 mg  600 mg Oral Q6H PRN [name], [name], MD   600 mg at  01/18/23 7846   LORazepam (ATIVAN) tablet 1 mg  1 mg Oral TID PRN [name], DO   1 mg at 09/27/22 0025   magnesium hydroxide (MILK OF MAGNESIA) suspension 30 mL  30 mL Oral Daily PRN [name], DO   30 mL at 11/08/22 1012   paliperidone (INVEGA SUSTENNA) injection 156 mg  156 mg Intramuscular Q28 days [name], MD   156 mg at 01/15/23 1501   traZODone (DESYREL) tablet 50 mg  50 mg Oral QHS PRN [name], DO   50 mg at 01/18/23 2158    Lab Results: No results found for this or any previous visit (from the past 48 hour(s)).  Blood Alcohol level:  Lab Results  Component Value Date   ETH <10 08/17/2022   ETH <10 01/11/2021    Metabolic Disorder Labs: No results found for: "HGBA1C", "MPG" No results found for: "PROLACTIN" No results found for: "CHOL", "TRIG", "HDL", "CHOLHDL", "VLDL", "LDLCALC"  Physical Findings: AIMS:  , ,  ,  ,    CIWA:    COWS:     Musculoskeletal: Strength & Muscle Tone: within normal limits Gait & Station: normal Patient leans: Backward  Psychiatric Specialty Exam:  Presentation  General Appearance:  Casual; Neat  Eye Contact: Fair  Speech: Slow; Slurred  Speech Volume: Decreased  Handedness: Right   Mood and Affect  Mood: Anxious  Affect: Inappropriate   Thought Process  Thought Processes: Disorganized  Descriptions of Associations:Loose  Orientation:Partial  Thought Content:Illogical; Rumination  History of Schizophrenia/Schizoaffective disorder:No data recorded Duration of Psychotic Symptoms:No data recorded Hallucinations:No data recorded Ideas of Reference:Paranoia  Suicidal Thoughts:No data recorded Homicidal Thoughts:No data recorded  Sensorium  Memory: Immediate Fair; Remote Poor  Judgment: Poor  Insight: Poor   Executive Functions  Concentration: Poor  Attention Span: Fair  Recall: [employer] of  Knowledge: Fair  Language: Fair   Psychomotor Activity  Psychomotor Activity:No data recorded  Assets  Assets: Communication Skills   Sleep  Sleep:No data recorded    Blood pressure (!) 123/55, pulse 75, temperature (!) 97.2 F (36.2 C), resp. rate 16, height 5\' 6"  (1.676 m), weight (P) 82.8 kg, SpO2 99%. Body mass index is 29.46 kg/m (pended).   Treatment Plan Summary: Daily contact with patient to assess and evaluate symptoms and progress in treatment, Medication management, and Plan continue current medications.  [name], DO 01/19/2023, 12:05 PM

## 2023-01-19 NOTE — Plan of Care (Signed)
D- Patient alert and oriented. Flat affect. RIS. Denies anxiety and depression.  Denies SI, HI, AVH, and pain. Quotes. Goal. How did they do with achieving previous goal. A- Scheduled medications administered to patient, per MD orders. PRN given for sleep. Support and encouragement provided.  Routine safety checks conducted every 15 minutes.  Patient informed to notify staff with problems or concerns. R- No adverse drug reactions noted. Patient contracts for safety at this time. Patient compliant with medications and treatment plan. Patient receptive, calm, and cooperative. Isolates in room except for meds/meals.  Patient remains safe at this time.  Problem: Coping: Goal: Level of anxiety will decrease Outcome: Progressing   Problem: Pain Managment: Goal: General experience of comfort will improve Outcome: Progressing   Problem: Safety: Goal: Ability to remain free from injury will improve Outcome: Progressing

## 2023-01-20 DIAGNOSIS — F250 Schizoaffective disorder, bipolar type: Secondary | ICD-10-CM | POA: Diagnosis not present

## 2023-01-20 NOTE — Plan of Care (Signed)
D: Pt alert and oriented. Pt denies experiencing any anxiety/depression at this time. Pt denies experiencing any pain at this time. Pt denies experiencing any SI/HI, or AVH at this time.   A: Support and encouragement provided. Frequent verbal contact made. Routine safety checks conducted q15 minutes.   R: Pt verbally contracts for safety at this time. Pt compliant with treatment plan. Pt interacts well but minimally with others on the unit. Pt remains safe at this time. Plan of care ongoing.  Problem: Coping: Goal: Level of anxiety will decrease Outcome: Progressing   Problem: Elimination: Goal: Will not experience complications related to bowel motility Outcome: Progressing

## 2023-01-20 NOTE — Progress Notes (Signed)
   01/20/23 0600  15 Minute Checks  Location Bedroom  Visual Appearance Calm  Behavior Sleeping  Sleep (Behavioral Health Patients Only)  Calculate sleep? (Click Yes once per 24 hr at 0600 safety check) Yes  Documented sleep last 24 hours 12.5

## 2023-01-20 NOTE — Plan of Care (Signed)
Patient alert and oriented. Calm and cooperative. Isolates in room except for meds/meals. Pt remains stable. C/o b/l leg pain 81/0. PRNs given for pain and insomnia. Denies anxiety and depression. Denies SI, HI, AVH, and pain. Q 15 min checks maintained for safety.  Patient remains safe at this time.  Problem: Coping: Goal: Level of anxiety will decrease Outcome: Progressing   Problem: Pain Managment: Goal: General experience of comfort will improve Outcome: Progressing   Problem: Safety: Goal: Ability to remain free from injury will improve Outcome: Progressing   Problem: Skin Integrity: Goal: Risk for impaired skin integrity will decrease Outcome: Progressing

## 2023-01-20 NOTE — Group Note (Signed)
Recreation Therapy Group Note   Group Topic:Communication  Group Date: 01/20/2023 Start Time: 1400 End Time: 1455 Facilitators: Rosina Lowenstein, LRT, CTRS Location: Courtyard  Group Description: Emotional Check in. Patient sat and talked with LRT about how they are doing and whatever else is on their mind. LRT provided active listening, reassurance and encouragement. Pts were given the opportunity to listen to music or play cornhole while getting fresh air and sunlight in the courtyard.    Goal Area(s) Addressed: Patient will engage in conversation with LRT. Patient will communicate their wants, needs, or questions.  Patient will practice a new coping skill of "talking to someone".  Affect/Mood: Appropriate and Flat   Participation Level: Active and Engaged   Participation Quality: Independent   Behavior: Calm and Cooperative   Speech/Thought Process: Coherent   Insight: Good   Judgement: Good   Modes of Intervention: Open Conversation   Patient Response to Interventions:  Attentive and Receptive   Education Outcome:  Acknowledges education   Clinical Observations/Individualized Feedback: Betty Henderson was active in their participation of session activities and group discussion. Pt identified "Everyone is going home but me. Im going to be a party animal when I leave, you know. Mini skirt, boots, hair done and make up". Pt was singing along with the music being played from the speaker. Pt seemed to be in bright spirits today.    Plan: Continue to engage patient in RT group sessions 2-3x/week.   Rosina Lowenstein, LRT, CTRS 01/20/2023 3:17 PM

## 2023-01-20 NOTE — OR Nursing (Signed)
.  D- Patient alert and oriented. Patient in a pleasant mood. Denies SI, HI, AVH, and pain. A- Scheduled medications administered to patient, per MD orders. Support and encouragement provided.  Routine safety checks conducted every 15 minutes.  Patient informed to notify staff with problems or concerns. R- No adverse drug reactions noted. Patient contracts for safety at this time. Patient compliant with medications and treatment plan. Patient receptive, calm, and cooperative. Patient interacts well with others on the unit.  Patient remains safe at this time.

## 2023-01-20 NOTE — Group Note (Signed)
Date:  01/20/2023 Time:  11:14 AM  Group Topic/Focus:  Developing a Wellness Toolbox:   The focus of this group is to help patients develop a "wellness toolbox" with skills and strategies to promote recovery upon discharge. Dimensions of Wellness:   The focus of this group is to introduce the topic of wellness and discuss the role each dimension of wellness plays in total health.    Participation Level:  Active  Participation Quality:  Appropriate  Affect:  Appropriate  Cognitive:  Alert, Appropriate, and Oriented  Insight: Appropriate and Improving  Engagement in Group:  Developing/Improving and Engaged  Modes of Intervention:  Activity, Education, and Socialization  Additional Comments:    [name] 01/20/2023, 11:14 AM

## 2023-01-20 NOTE — Group Note (Signed)
Date:  01/20/2023 Time:  4:31 PM  Group Topic/Focus:  Dimensions of Wellness:   The focus of this group is to introduce the topic of wellness and discuss the role each dimension of wellness plays in total health.    Participation Level:  Active  Participation Quality:  Appropriate  Affect:  Appropriate  Cognitive:  Appropriate  Insight: Appropriate  Engagement in Group:  Engaged  Modes of Intervention:  Activity  Additional Comments:    Betty Henderson 01/20/2023, 4:31 PM

## 2023-01-20 NOTE — Progress Notes (Signed)
[facility] MD Progress Note  01/20/2023 12:33 PM Betty Henderson  MRN:  [id number] Subjective: Betty Henderson is seen on rounds.  She received her [place] last week.  She has been pleasant and cooperative.  Good controls.  Social work continues to work on discharge planning with the state which is trying to figure out her guardianship.  She denies any suicidal ideation. Principal Problem: Schizoaffective disorder, bipolar type (HCC) Diagnosis: Principal Problem:   Schizoaffective disorder, bipolar type (HCC)  Total Time spent with patient: 15 minutes  Past Psychiatric History: Schizoaffective disorder  Past Medical History:  Past Medical History:  Diagnosis Date   Anemia    Arthritis    Chronic pain    Drug-seeking behavior    Malingering    Osteopetrosis    Psychosis (HCC)    Schizoaffective disorder, bipolar type (HCC)     Past Surgical History:  Procedure Laterality Date   MOUTH SURGERY     TUBAL LIGATION     Family History:  Family History  Family history unknown: Yes   Family Psychiatric  History: Unremarkable Social History:  Social History   Substance and Sexual Activity  Alcohol Use Not Currently   Comment: 1 cocktail 3 weeks ago     Social History   Substance and Sexual Activity  Drug Use Not Currently   Types: Cocaine   Comment: states "it's legal though"    Social History   Socioeconomic History   Marital status: Single    Spouse name: Not on file   Number of children: Not on file   Years of education: Not on file   Highest education level: Not on file  Occupational History   Not on file  Tobacco Use   Smoking status: Every Day    Current packs/day: 0.50    Types: Cigarettes   Smokeless tobacco: Never  Vaping Use   Vaping status: Never Used  Substance and Sexual Activity   Alcohol use: Not Currently    Comment: 1 cocktail 3 weeks ago   Drug use: Not Currently    Types: Cocaine    Comment: states "it's legal though"   Sexual activity: Never   Other Topics Concern   Not on file  Social History Narrative   Not on file   Social Determinants of Health   Financial Resource Strain: Not on file  Food Insecurity: Food Insecurity Present (08/22/2022)   Hunger Vital Sign    Worried About Running Out of Food in the Last Year: Sometimes true    Ran Out of Food in the Last Year: Sometimes true  Transportation Needs: Patient Unable To Answer (08/22/2022)   PRAPARE - Transportation    Lack of Transportation (Medical): Patient unable to answer    Lack of Transportation (Non-Medical): Patient unable to answer  Recent Concern: Transportation Needs - Unmet Transportation Needs (06/13/2022)   Received from [facility], [facility]   [facility] - Transportation    Lack of Transportation (Medical): Not on file    Lack of Transportation (Non-Medical): Yes  Physical Activity: Not on file  Stress: Not on file  Social Connections: Unknown (08/06/2021)   Received from [facility], [facility]   Social Network    Social Network: Not on file   Additional Social History:                         Sleep: Good  Appetite:  Good  Current Medications: Current Facility-Administered Medications  Medication Dose  Route Frequency Provider Last Rate Last Admin   acetaminophen (TYLENOL) tablet 650 mg  650 mg Oral Q6H PRN [name], DO   650 mg at 01/19/23 2137   alum & mag hydroxide-simeth (MAALOX/MYLANTA) 200-200-20 MG/5ML suspension 30 mL  30 mL Oral Q4H PRN [name], DO   30 mL at 09/20/22 1142   diphenhydrAMINE (BENADRYL) capsule 50 mg  50 mg Oral Q6H PRN [name], DO   50 mg at 09/29/22 2254   Or   diphenhydrAMINE (BENADRYL) injection 50 mg  50 mg Intramuscular Q6H PRN [name], DO       haloperidol (HALDOL) tablet 5 mg  5 mg Oral Q6H PRN [name], DO   5 mg at 09/28/22 2149   Or   haloperidol lactate (HALDOL) injection 5 mg  5 mg Intramuscular Q6H PRN [name], DO       ibuprofen (ADVIL) tablet 600 mg  600 mg Oral Q6H PRN [name], [name], MD   600 mg at 01/18/23 9604   LORazepam (ATIVAN) tablet 1 mg  1 mg Oral TID PRN [name], DO   1 mg at 09/27/22 0025   magnesium hydroxide (MILK OF MAGNESIA) suspension 30 mL  30 mL Oral Daily PRN [name], DO   30 mL at 11/08/22 1012   paliperidone (INVEGA SUSTENNA) injection 156 mg  156 mg Intramuscular Q28 days [name], MD   156 mg at 01/15/23 1501   traZODone (DESYREL) tablet 50 mg  50 mg Oral QHS PRN [name], DO   50 mg at 01/19/23 2137    Lab Results: No results found for this or any previous visit (from the past 48 hour(s)).  Blood Alcohol level:  Lab Results  Component Value Date   ETH <10 08/17/2022   ETH <10 01/11/2021    Metabolic Disorder Labs: No results found for: "HGBA1C", "MPG" No results found for: "PROLACTIN" No results found for: "CHOL", "TRIG", "HDL", "CHOLHDL", "VLDL", "LDLCALC"  Physical Findings: AIMS:  , ,  ,  ,    CIWA:    COWS:     Musculoskeletal: Strength & Muscle Tone: within normal limits Gait & Station: normal Patient leans: N/A  Psychiatric Specialty Exam:  Presentation  General Appearance:  Casual; Neat  Eye Contact: Fair  Speech: Slow; Slurred  Speech Volume: Decreased  Handedness: Right   Mood and Affect  Mood: Anxious  Affect: Inappropriate   Thought Process  Thought Processes: Disorganized  Descriptions of Associations:Loose  Orientation:Partial  Thought Content:Illogical; Rumination  History of Schizophrenia/Schizoaffective disorder:No data recorded Duration of Psychotic Symptoms:No data recorded Hallucinations:No data recorded Ideas of Reference:Paranoia  Suicidal Thoughts:No data recorded Homicidal Thoughts:No data recorded  Sensorium  Memory: Immediate Fair; Remote Poor  Judgment: Poor  Insight: Poor   Executive Functions   Concentration: Poor  Attention Span: Fair  Recall: [employer] of Knowledge: Fair  Language: Fair   Psychomotor Activity  Psychomotor Activity:No data recorded  Assets  Assets: Communication Skills   Sleep  Sleep:No data recorded   MENTAL STATUS EXAM: Patient is alert and oriented x 3, pleasant and cooperative, good eye contact, speech is normal and not pressured, mood is euthymic and affect is flat; thought process: goal directed; thought content: She denies suicidal ideation; judgment is poor, insight is poor. Blood pressure 131/81, pulse 80, temperature (!) 97.3 F (36.3 C), resp. rate 16, height 5\' 6"  (1.676 m), weight (P) 82.8 kg, SpO2  100%. Body mass index is 29.46 kg/m (pended).   Treatment Plan Summary: Daily contact with patient to assess and evaluate symptoms and progress in treatment, Medication management, and Plan continue current medication.  [name], DO 01/20/2023, 12:33 PM

## 2023-01-21 DIAGNOSIS — F250 Schizoaffective disorder, bipolar type: Secondary | ICD-10-CM | POA: Diagnosis not present

## 2023-01-21 NOTE — Group Note (Signed)
Date:  01/21/2023 Time:  11:28 AM  Group Topic/Focus:  Goals Group/Karaoke   The focus of this group is to help patients establish daily goals to achieve during treatment and discuss how the patient can incorporate goal setting into their daily lives to aide in recovery. Also allowing patients to sing along to their favorite songs in [place]     Participation Level:  Active  Participation Quality:  Appropriate  Affect:  Appropriate  Cognitive:  Alert and Appropriate  Insight: Appropriate  Engagement in Group:  Engaged  Modes of Intervention:  Activity and Discussion  Additional Comments:    [name] 01/21/2023, 11:28 AM

## 2023-01-21 NOTE — Plan of Care (Signed)
  Problem: Education: Goal: Knowledge of General Education information will improve Description: Including pain rating scale, medication(s)/side effects and non-pharmacologic comfort measures Outcome: Progressing   Problem: Health Behavior/Discharge Planning: Goal: Ability to manage health-related needs will improve Outcome: Progressing   Problem: Clinical Measurements: Goal: Ability to maintain clinical measurements within normal limits will improve Outcome: Progressing Goal: Diagnostic test results will improve Outcome: Progressing   Problem: Nutrition: Goal: Adequate nutrition will be maintained Outcome: Progressing   Problem: Coping: Goal: Level of anxiety will decrease Outcome: Progressing   Problem: Elimination: Goal: Will not experience complications related to bowel motility Outcome: Progressing Goal: Will not experience complications related to urinary retention Outcome: Progressing   Problem: Pain Managment: Goal: General experience of comfort will improve Outcome: Progressing   Problem: Safety: Goal: Ability to remain free from injury will improve Outcome: Progressing   Problem: Skin Integrity: Goal: Risk for impaired skin integrity will decrease Outcome: Progressing

## 2023-01-21 NOTE — Progress Notes (Signed)
   01/21/23 1124  Psych Admission Type (Psych Patients Only)  Admission Status Voluntary  Psychosocial Assessment  Patient Complaints None  Eye Contact Brief  Facial Expression Flat  Affect Flat  Speech Soft  Interaction Minimal  Motor Activity Slow;Shuffling  Appearance/Hygiene In scrubs  Behavior Characteristics Cooperative;Appropriate to situation  Mood Depressed  Thought Process  Coherency WDL  Content WDL  Delusions None reported or observed  Perception Hallucinations  Hallucination Auditory  Judgment Impaired  Confusion None  Danger to Self  Current suicidal ideation? Denies  Agreement Not to Harm Self Yes  Description of Agreement verbal  Danger to Others  Danger to Others None reported or observed  Danger to Others Abnormal  Harmful Behavior to others No threats or harm toward other people  Destructive Behavior No threats or harm toward property

## 2023-01-21 NOTE — Group Note (Signed)
Recreation Therapy Group Note   Group Topic:General Recreation  Group Date: 01/21/2023 Start Time: 1400 End Time: 1500 Facilitators: Rosina Lowenstein, LRT, CTRS Location:  Day Room  Group Description: Bingo. LRT and patients played multiple games of Bingo with music playing in the background. LRT and pts discussed how this could be a leisure interest and the importance of doing things they enjoy post-discharge. Pts won stress balls and [employer] as Chief Financial Officer. LRT gave pts the opportunity to go outside to the courtyard for fresh air and sunlight after.   Goal Area(s) Addressed: Patient will identify leisure interests.  Patient will practice healthy decision making. Patient will engage in recreation activity.  Patient will increase communication.    Affect/Mood: Appropriate   Participation Level: Active and Engaged   Participation Quality: Independent   Behavior: Appropriate, Calm, and Cooperative   Speech/Thought Process: Coherent   Insight: Good   Judgement: Good   Modes of Intervention: Activity   Patient Response to Interventions:  Attentive, Engaged, Interested , and Receptive   Education Outcome:  Acknowledges education   Clinical Observations/Individualized Feedback: Chalene was active in their participation of session activities and group discussion. Pt won multiples games of bingo and chose to go outside for fresh air and sunlight afterwards. Pt interacted well with LRT and peers duration of session.   Plan: Continue to engage patient in RT group sessions 2-3x/week.   Rosina Lowenstein, LRT, CTRS 01/21/2023 3:11 PM

## 2023-01-21 NOTE — Group Note (Signed)
Date:  01/21/2023 Time:  2:20 AM  Group Topic/Focus:  Wellness Toolbox:   The focus of this group is to discuss various aspects of wellness, balancing those aspects and exploring ways to increase the ability to experience wellness.  Patients will create a wellness toolbox for use upon discharge.    Participation Level:  Did Not Attend  Participation Quality:      Affect:      Cognitive:      Insight: None  Engagement in Group:  None  Modes of Intervention:      Additional Comments:    [name] 01/21/2023, 2:20 AM

## 2023-01-21 NOTE — Group Note (Signed)
Date:  01/21/2023 Time:  10:19 PM  Group Topic/Focus:  Goals Group:   The focus of this group is to help patients establish daily goals to achieve during treatment and discuss how the patient can incorporate goal setting into their daily lives to aide in recovery. Wrap-Up Group:   The focus of this group is to help patients review their daily goal of treatment and discuss progress on daily workbooks.    Participation Level:  Active  Participation Quality:  Appropriate  Affect:  Appropriate  Cognitive:  Alert  Insight: Good  Engagement in Group:  Engaged  Modes of Intervention:  Discussion  Additional Comments:    Betty Henderson 01/21/2023, 10:19 PM

## 2023-01-21 NOTE — Progress Notes (Signed)
[facility] MD Progress Note  Betty Henderson  MRN:  [id number]  Subjective: Case discussed in multidisciplinary meeting today, chart reviewed, patient seen today during rounds.  Social worker shared that patient had psychological testing done, according to report patient has capacity to take care of self.  Patient reports she is doing fine today.  No new acute issues overnight reported by staff or patient.  Patient has been attending groups and participating in milieu.  She is interacting well with her peers.  Patient denies auditory or visual hallucinations.    Principal Problem: Schizoaffective disorder, bipolar type (HCC) Diagnosis: Principal Problem:   Schizoaffective disorder, bipolar type (HCC)   Past Psychiatric History: Schizoaffective disorder, bipolar type.  Past Medical History:  Past Medical History:  Diagnosis Date   Anemia    Arthritis    Chronic pain    Drug-seeking behavior    Malingering    Osteopetrosis    Psychosis (HCC)    Schizoaffective disorder, bipolar type (HCC)     Past Surgical History:  Procedure Laterality Date   MOUTH SURGERY     TUBAL LIGATION     Family History:  Family History  Family history unknown: Yes   Family Psychiatric  History: Unremarkable Social History:  Social History   Substance and Sexual Activity  Alcohol Use Not Currently   Comment: 1 cocktail 3 weeks ago     Social History   Substance and Sexual Activity  Drug Use Not Currently   Types: Cocaine   Comment: states "it's legal though"    Social History   Socioeconomic History   Marital status: Single    Spouse name: Not on file   Number of children: Not on file   Years of education: Not on file   Highest education level: Not on file  Occupational History   Not on file  Tobacco Use   Smoking status: Every Day    Current packs/day: 0.50    Types: Cigarettes   Smokeless tobacco: Never  Vaping Use   Vaping status: Never Used  Substance and Sexual Activity    Alcohol use: Not Currently    Comment: 1 cocktail 3 weeks ago   Drug use: Not Currently    Types: Cocaine    Comment: states "it's legal though"   Sexual activity: Never  Other Topics Concern   Not on file  Social History Narrative   Not on file   Social Determinants of Health   Financial Resource Strain: Not on file  Food Insecurity: Food Insecurity Present (08/22/2022)   Hunger Vital Sign    Worried About Running Out of Food in the Last Year: Sometimes true    Ran Out of Food in the Last Year: Sometimes true  Transportation Needs: Patient Unable To Answer (08/22/2022)   PRAPARE - Transportation    Lack of Transportation (Medical): Patient unable to answer    Lack of Transportation (Non-Medical): Patient unable to answer  Recent Concern: Transportation Needs - Unmet Transportation Needs (06/13/2022)   Received from [facility], [facility]   [facility] - Transportation    Lack of Transportation (Medical): Not on file    Lack of Transportation (Non-Medical): Yes  Physical Activity: Not on file  Stress: Not on file  Social Connections: Unknown (08/06/2021)   Received from [facility], [facility]   Social Network    Social Network: Not on file   Additional Social History:     Patient is homeless at this time.  Social worker is  working with DSS on Medicaid application and  placement.                    Sleep: Good  Appetite:  Good  Current Medications: Current Facility-Administered Medications  Medication Dose Route Frequency Provider Last Rate Last Admin   acetaminophen (TYLENOL) tablet 650 mg  650 mg Oral Q6H PRN [name], DO   650 mg at 01/20/23 2317   alum & mag hydroxide-simeth (MAALOX/MYLANTA) 200-200-20 MG/5ML suspension 30 mL  30 mL Oral Q4H PRN [name], DO   30 mL at 09/20/22 1142   diphenhydrAMINE (BENADRYL) capsule 50 mg  50 mg Oral Q6H PRN [name], DO   50 mg at 09/29/22 2254   Or   diphenhydrAMINE  (BENADRYL) injection 50 mg  50 mg Intramuscular Q6H PRN [name], DO       haloperidol (HALDOL) tablet 5 mg  5 mg Oral Q6H PRN [name], DO   5 mg at 09/28/22 2149   Or   haloperidol lactate (HALDOL) injection 5 mg  5 mg Intramuscular Q6H PRN [name], DO       ibuprofen (ADVIL) tablet 600 mg  600 mg Oral Q6H PRN [name], [name], MD   600 mg at 01/18/23 0960   LORazepam (ATIVAN) tablet 1 mg  1 mg Oral TID PRN [name], DO   1 mg at 09/27/22 0025   magnesium hydroxide (MILK OF MAGNESIA) suspension 30 mL  30 mL Oral Daily PRN [name], DO   30 mL at 11/08/22 1012   paliperidone (INVEGA SUSTENNA) injection 156 mg  156 mg Intramuscular Q28 days [name], MD   156 mg at 01/15/23 1501   traZODone (DESYREL) tablet 50 mg  50 mg Oral QHS PRN [name], DO   50 mg at 01/20/23 2317    Lab Results: No results found for this or any previous visit (from the past 48 hour(s)).  Blood Alcohol level:  Lab Results  Component Value Date   ETH <10 08/17/2022   ETH <10 01/11/2021      Musculoskeletal: Strength & Muscle Tone: within normal limits Gait & Station: normal Patient leans: N/A   Psychiatric Specialty Exam:   Presentation  General Appearance:  Casual; Neat   Eye Contact: Fair   Speech: Spontaneous   Speech Volume: Normal   Handedness: Right     Mood and Affect  Mood: "Fine"   Affect: Stable and pleasant    Thought Processes: Improved, at baseline   Descriptions of Associations: Intact   Orientation: Well-oriented   Thought Content: Improved, at baseline  Hallucinations:Denies, occasionally heard talking to self in her room  Ideas of Reference:None noted   Suicidal Thoughts:Denies SI, no intention or plan  Homicidal Thoughts:Denies HI, no intention or plan   Sensorium  Memory: Immediate Fair; Remote Poor   Judgment: Fair, pt is Rx compliant    Insight: Improved     Executive Functions  Concentration: Improved   Attention Span: Improved   Language: Fair     Psychomotor Activity  Psychomotor Activity: Normal  Assets  Assets: Communication Skills     Sleep  Sleep:Improved     Physical Exam: Physical Exam Vitals and nursing note reviewed.  Constitutional:      Appearance: Normal appearance. She is normal weight.  Neurological:     General: No focal deficit present.     Mental Status: She is alert.  Review of Systems  Constitutional: Negative.   HENT: Negative.    Eyes: Negative.   Respiratory: Negative.    Cardiovascular: Negative.   Gastrointestinal: Negative.   Genitourinary: Negative.   Musculoskeletal: Negative.   Skin: Negative.   Neurological: Negative.   Endo/Heme/Allergies: Negative.     Blood pressure (!) 105/53, pulse 84, temperature 97.7 F (36.5 C), resp. rate 18, height 5\' 6"  (1.676 m), weight (P) 82.8 kg, SpO2 100%. Body mass index is 29.46 kg/m (pended).   Treatment Plan Summary: Daily contact with patient to assess and evaluate symptoms and progress in treatment, Medication management, and Plan continue current medications.  Continue to monitor patient on as needed meds   Patient received  dose of Invega Sustenna 156 mg IM on  01/15/23  Patient is awaiting placement    [name], MD

## 2023-01-22 DIAGNOSIS — F250 Schizoaffective disorder, bipolar type: Secondary | ICD-10-CM | POA: Diagnosis not present

## 2023-01-22 NOTE — Progress Notes (Signed)
   01/22/23 0615  15 Minute Checks  Location Bedroom  Visual Appearance Calm  Behavior Sleeping  Sleep (Behavioral Health Patients Only)  Calculate sleep? (Click Yes once per 24 hr at 0600 safety check) Yes  Documented sleep last 24 hours 11.25

## 2023-01-22 NOTE — Progress Notes (Signed)
   01/22/23 1400  Spiritual Encounters  Type of Visit Follow up  Care provided to: Patient  Referral source Chaplain assessment  Spiritual Framework  Presenting Themes Impactful experiences and emotions   Chaplain met with patient outside while she was in group. Patient talked to the chaplain about the singing group she used to be in with her family. She also shared that she has not talked to her brothers and sisters in a long time and do not know where they are now. Patient stated that she really hopes that she gets to go home soon and that she would like to be gone before Christmas so she can give her grandchildren gifts.

## 2023-01-22 NOTE — Plan of Care (Signed)
  Problem: Education: Goal: Knowledge of General Education information will improve Description: Including pain rating scale, medication(s)/side effects and non-pharmacologic comfort measures Outcome: Progressing   Problem: Health Behavior/Discharge Planning: Goal: Ability to manage health-related needs will improve Outcome: Progressing   Problem: Clinical Measurements: Goal: Ability to maintain clinical measurements within normal limits will improve Outcome: Progressing Goal: Diagnostic test results will improve Outcome: Progressing   Problem: Nutrition: Goal: Adequate nutrition will be maintained Outcome: Progressing   Problem: Coping: Goal: Level of anxiety will decrease Outcome: Progressing   Problem: Elimination: Goal: Will not experience complications related to bowel motility Outcome: Progressing Goal: Will not experience complications related to urinary retention Outcome: Progressing   Problem: Pain Managment: Goal: General experience of comfort will improve Outcome: Progressing   Problem: Safety: Goal: Ability to remain free from injury will improve Outcome: Progressing   Problem: Skin Integrity: Goal: Risk for impaired skin integrity will decrease Outcome: Progressing

## 2023-01-22 NOTE — Group Note (Signed)
Recreation Therapy Group Note   Group Topic:Health and Wellness  Group Date: 01/22/2023 Start Time: 1400 End Time: 1445 Facilitators: Rosina Lowenstein, LRT, CTRS Location:  Courtyard  Group Description: Courtyard games. Patients were given the opportunity to play ring toss, play corn hole, or listen to music while sitting in the courtyard getting fresh air and sunlight. Pt identified and conversated about things they enjoy doing in their free time and how they can continue to do that outside of the hospital.  Goal Area(s) Addressed: Patient will engage in a recreation game. Patient will build frustration tolerance skills. Patient will practice making a positive decision. Patient will have the opportunity to try a new leisure activity.   Affect/Mood: Appropriate   Participation Level: Active and Engaged   Participation Quality: Independent   Behavior: Appropriate, Calm, and Cooperative   Speech/Thought Process: Coherent   Insight: Good   Judgement: Good   Modes of Intervention: Activity   Patient Response to Interventions:  Attentive and Receptive   Education Outcome:  Acknowledges education   Clinical Observations/Individualized Feedback: Betty Henderson was active in their participation of session activities and group discussion. Pt interacted well with LRT and peers duration of session.   Plan: Continue to engage patient in RT group sessions 2-3x/week.   Rosina Lowenstein, LRT, CTRS 01/22/2023 2:54 PM

## 2023-01-22 NOTE — Group Note (Signed)
[facility] LCSW Group Therapy Note   Group Date: 01/22/2023 Start Time: 1330 End Time: 1400   Type of Therapy/Topic:  Group Therapy:  Emotion Regulation  Participation Level:  Active   Mood: Content   Description of Group:    The purpose of this group is to assist patients in learning to regulate negative emotions and experience positive emotions. Patients will be guided to discuss ways in which they have been vulnerable to their negative emotions. These vulnerabilities will be juxtaposed with experiences of positive emotions or situations, and patients challenged to use positive emotions to combat negative ones. Special emphasis will be placed on coping with negative emotions in conflict situations, and patients will process healthy conflict resolution skills.  Therapeutic Goals: Patient will identify two positive emotions or experiences to reflect on in order to balance out negative emotions:  Patient will label two or more emotions that they find the most difficult to experience:  Patient will be able to demonstrate positive conflict resolution skills through discussion or role plays:   Summary of Patient Progress:   Betty Henderson participated throughout group and was able to use skills from "IMPROVE" the moment exercise presented during group    Therapeutic Modalities:   Cognitive Behavioral Therapy Feelings Identification Dialectical Behavioral Therapy   Elza Rafter, LCSWA

## 2023-01-22 NOTE — Progress Notes (Addendum)
2154-Patient received PRN Tylenol 650 mg PO for complaints of leg and hip pain and Trazodone 50 mg PO for insomnia. Medication effective, pt observed lying in bed, resting. Routine observations to monitor patient safety to continue.

## 2023-01-22 NOTE — Progress Notes (Signed)
[facility] MD Progress Note  Betty Henderson  MRN:  [id number]  Subjective: Case discussed in multidisciplinary meeting today, chart reviewed, patient seen today during rounds.  Social worker shared that patient had psychological testing done, according to report patient has capacity to take care of self.  Social worker is working with DSS on placement.  Patient reports she is doing fine today.  No new acute issues overnight reported by staff or patient.  Patient reports her sleep is "okay".  Patient has been attending groups and participating in milieu.  She is interacting well with her peers.  Patient denies auditory or visual hallucinations.  She was heard thinking in her room.  Principal Problem: Schizoaffective disorder, bipolar type (HCC) Diagnosis: Principal Problem:   Schizoaffective disorder, bipolar type (HCC)   Past Psychiatric History: Schizoaffective disorder, bipolar type.  Past Medical History:  Past Medical History:  Diagnosis Date   Anemia    Arthritis    Chronic pain    Drug-seeking behavior    Malingering    Osteopetrosis    Psychosis (HCC)    Schizoaffective disorder, bipolar type (HCC)     Past Surgical History:  Procedure Laterality Date   MOUTH SURGERY     TUBAL LIGATION     Family History:  Family History  Family history unknown: Yes   Family Psychiatric  History: Unremarkable Social History:  Social History   Substance and Sexual Activity  Alcohol Use Not Currently   Comment: 1 cocktail 3 weeks ago     Social History   Substance and Sexual Activity  Drug Use Not Currently   Types: Cocaine   Comment: states "it's legal though"    Social History   Socioeconomic History   Marital status: Single    Spouse name: Not on file   Number of children: Not on file   Years of education: Not on file   Highest education level: Not on file  Occupational History   Not on file  Tobacco Use   Smoking status: Every Day    Current packs/day: 0.50     Types: Cigarettes   Smokeless tobacco: Never  Vaping Use   Vaping status: Never Used  Substance and Sexual Activity   Alcohol use: Not Currently    Comment: 1 cocktail 3 weeks ago   Drug use: Not Currently    Types: Cocaine    Comment: states "it's legal though"   Sexual activity: Never  Other Topics Concern   Not on file  Social History Narrative   Not on file   Social Determinants of Health   Financial Resource Strain: Not on file  Food Insecurity: Food Insecurity Present (08/22/2022)   Hunger Vital Sign    Worried About Running Out of Food in the Last Year: Sometimes true    Ran Out of Food in the Last Year: Sometimes true  Transportation Needs: Patient Unable To Answer (08/22/2022)   PRAPARE - Transportation    Lack of Transportation (Medical): Patient unable to answer    Lack of Transportation (Non-Medical): Patient unable to answer  Recent Concern: Transportation Needs - Unmet Transportation Needs (06/13/2022)   Received from [facility], [facility]   [facility] - Transportation    Lack of Transportation (Medical): Not on file    Lack of Transportation (Non-Medical): Yes  Physical Activity: Not on file  Stress: Not on file  Social Connections: Unknown (08/06/2021)   Received from [facility], [facility]   Social Network    Social Network:  Not on file   Additional Social History:     Patient is homeless at this time.  Social worker is working with DSS on [employer] application and  placement.                    Sleep:OK  Appetite:  Good  Current Medications: Current Facility-Administered Medications  Medication Dose Route Frequency Provider Last Rate Last Admin   acetaminophen (TYLENOL) tablet 650 mg  650 mg Oral Q6H PRN [name], DO   650 mg at 01/21/23 2054   alum & mag hydroxide-simeth (MAALOX/MYLANTA) 200-200-20 MG/5ML suspension 30 mL  30 mL Oral Q4H PRN [name], DO   30 mL at 09/20/22 1142   diphenhydrAMINE  (BENADRYL) capsule 50 mg  50 mg Oral Q6H PRN [name], DO   50 mg at 09/29/22 2254   Or   diphenhydrAMINE (BENADRYL) injection 50 mg  50 mg Intramuscular Q6H PRN [name], DO       haloperidol (HALDOL) tablet 5 mg  5 mg Oral Q6H PRN [name], DO   5 mg at 09/28/22 2149   Or   haloperidol lactate (HALDOL) injection 5 mg  5 mg Intramuscular Q6H PRN [name], DO       ibuprofen (ADVIL) tablet 600 mg  600 mg Oral Q6H PRN [name], [name], MD   600 mg at 01/18/23 8119   LORazepam (ATIVAN) tablet 1 mg  1 mg Oral TID PRN [name], DO   1 mg at 09/27/22 0025   magnesium hydroxide (MILK OF MAGNESIA) suspension 30 mL  30 mL Oral Daily PRN [name], DO   30 mL at 11/08/22 1012   paliperidone (INVEGA SUSTENNA) injection 156 mg  156 mg Intramuscular Q28 days [name], MD   156 mg at 01/15/23 1501   traZODone (DESYREL) tablet 50 mg  50 mg Oral QHS PRN [name], DO   50 mg at 01/21/23 2054    Lab Results: No results found for this or any previous visit (from the past 48 hour(s)).  Blood Alcohol level:  Lab Results  Component Value Date   ETH <10 08/17/2022   ETH <10 01/11/2021      Musculoskeletal: Strength & Muscle Tone: within normal limits Gait & Station: normal Patient leans: N/A   Psychiatric Specialty Exam:   Presentation  General Appearance:  Casual; Neat   Eye Contact: Fair   Speech: Spontaneous   Speech Volume: Normal   Handedness: Right     Mood and Affect  Mood: "Fine"   Affect: Stable and pleasant    Thought Processes: Improved, at baseline   Descriptions of Associations: Intact   Orientation: Well-oriented   Thought Content: Improved, at baseline  Hallucinations:Denies, occasionally heard talking to self in her room  Ideas of Reference:None noted   Suicidal Thoughts:Denies SI, no intention or plan  Homicidal Thoughts:Denies HI, no  intention or plan   Sensorium  Memory: Immediate Fair; Remote Poor   Judgment: Fair, pt is Rx compliant   Insight: Improved     Executive Functions  Concentration: Improved   Attention Span: Improved   Language: Fair     Psychomotor Activity  Psychomotor Activity: Normal  Assets  Assets: Communication Skills     Sleep  Sleep:Improved     Physical Exam: Physical Exam Vitals and nursing note reviewed.  Constitutional:      Appearance: Normal appearance. She is normal weight.  Neurological:     General: No focal deficit present.     Mental Status: She is alert.      Review of Systems  Constitutional: Negative.   HENT: Negative.    Eyes: Negative.   Respiratory: Negative.    Cardiovascular: Negative.   Gastrointestinal: Negative.   Genitourinary: Negative.   Musculoskeletal: Negative.   Skin: Negative.   Neurological: Negative.   Endo/Heme/Allergies: Negative.     Blood pressure (!) 99/54, pulse 89, temperature (!) 97.3 F (36.3 C), resp. rate 16, height 5\' 6"  (1.676 m), weight (P) 82.8 kg, SpO2 97%. Body mass index is 29.46 kg/m (pended).   Treatment Plan Summary: Daily contact with patient to assess and evaluate symptoms and progress in treatment, Medication management, and Plan continue current medications.  Continue to monitor patient on as needed meds   Patient received  dose of Invega Sustenna 156 mg IM on  01/15/23  Patient is awaiting placement    [name], MD

## 2023-01-22 NOTE — Group Note (Signed)
Date:  01/22/2023 Time:  2:49 PM  Group Topic/Focus:  Self Care:   The focus of this group is to help patients understand the importance of self-care in order to improve or restore emotional, physical, spiritual, interpersonal, and financial health.    Participation Level:  Did Not Attend  Betty Henderson 01/22/2023, 2:49 PM

## 2023-01-22 NOTE — Progress Notes (Signed)
   01/22/23 0700  Psych Admission Type (Psych Patients Only)  Admission Status Voluntary  Psychosocial Assessment  Patient Complaints None  Eye Contact Brief  Facial Expression Flat  Affect Flat  Speech Soft  Interaction Minimal  Motor Activity Slow;Shuffling  Appearance/Hygiene In scrubs  Behavior Characteristics Cooperative  Mood Depressed  Thought Process  Coherency WDL  Content WDL  Delusions None reported or observed  Perception Hallucinations  Hallucination Auditory  Judgment Impaired  Confusion None  Danger to Self  Current suicidal ideation? Denies  Agreement Not to Harm Self Yes  Description of Agreement verbal  Danger to Others  Danger to Others None reported or observed  Danger to Others Abnormal  Harmful Behavior to others No threats or harm toward other people  Destructive Behavior No threats or harm toward property

## 2023-01-23 DIAGNOSIS — F250 Schizoaffective disorder, bipolar type: Secondary | ICD-10-CM | POA: Diagnosis not present

## 2023-01-23 NOTE — Progress Notes (Signed)
   01/22/23 2300  Psych Admission Type (Psych Patients Only)  Admission Status Voluntary  Psychosocial Assessment  Patient Complaints None  Eye Contact Poor;Brief  Facial Expression Flat  Affect Flat  Speech Soft  Interaction Minimal  Motor Activity Slow;Shuffling  Appearance/Hygiene In scrubs  Behavior Characteristics Cooperative  Mood Pleasant  Thought Process  Coherency WDL  Content WDL  Delusions None reported or observed  Perception Hallucinations  Hallucination Auditory  Judgment Impaired  Confusion None  Danger to Self  Current suicidal ideation? Denies  Agreement Not to Harm Self Yes  Description of Agreement Verbal  Danger to Others  Danger to Others None reported or observed  Danger to Others Abnormal  Harmful Behavior to others No threats or harm toward other people  Destructive Behavior No threats or harm toward property   Pt. Received PRN Tylenol 650 mg PO for hip/leg pain and Trazodone 50 mg PO for insomnia. Both medications effective.

## 2023-01-23 NOTE — Plan of Care (Signed)
  Problem: Education: Goal: Knowledge of General Education information will improve Description Including pain rating scale, medication(s)/side effects and non-pharmacologic comfort measures Outcome: Progressing   Problem: Health Behavior/Discharge Planning: Goal: Ability to manage health-related needs will improve Outcome: Progressing   

## 2023-01-23 NOTE — Group Note (Signed)
Date:  01/23/2023 Time:  12:08 AM  Group Topic/Focus:  Building Self Esteem:   The Focus of this group is helping patients become aware of the effects of self-esteem on their lives, the things they and others do that enhance or undermine their self-esteem, seeing the relationship between their level of self-esteem and the choices they make and learning ways to enhance self-esteem. Recovery Goals:   The focus of this group is to identify appropriate goals for recovery and establish a plan to achieve them. Relapse Prevention Planning:   The focus of this group is to define relapse and discuss the need for planning to combat relapse.    Participation Level:  Active  Participation Quality:  Appropriate  Affect:  Appropriate  Cognitive:  Appropriate  Insight: Good  Engagement in Group:  Engaged  Modes of Intervention:  Education  Additional Comments:    Betty Henderson 01/23/2023, 12:08 AM

## 2023-01-23 NOTE — Progress Notes (Signed)
Patient admitted IVC on Aug 22, 2022 for worsening psychosis. She is now a voluntary admssion.   She denies SI/HI. She denies AVH although patient is heard responding to internal stimuli from her bedroom. No unsafe behaviors noted.  No scheduled meds on this shift. No PRN's requested.  Patient is awaiting placement. Q15 minute unit checks in place.

## 2023-01-23 NOTE — Progress Notes (Signed)
   01/23/23 0615  15 Minute Checks  Location Bedroom  Visual Appearance Calm  Behavior Sleeping  Sleep (Behavioral Health Patients Only)  Calculate sleep? (Click Yes once per 24 hr at 0600 safety check) Yes  Documented sleep last 24 hours 14.5

## 2023-01-23 NOTE — BH IP Treatment Plan (Signed)
Interdisciplinary Treatment and Diagnostic Plan Update  01/23/2023 Time of Session: 9:30AM Aleene Durakovic MRN: [id number]  Principal Diagnosis: Schizoaffective disorder, bipolar type [facility])  Secondary Diagnoses: Principal Problem:   Schizoaffective disorder, bipolar type (HCC)   Current Medications:  Current Facility-Administered Medications  Medication Dose Route Frequency Provider Last Rate Last Admin   acetaminophen (TYLENOL) tablet 650 mg  650 mg Oral Q6H PRN [name], DO   650 mg at 01/22/23 2109   alum & mag hydroxide-simeth (MAALOX/MYLANTA) 200-200-20 MG/5ML suspension 30 mL  30 mL Oral Q4H PRN [name], DO   30 mL at 09/20/22 1142   diphenhydrAMINE (BENADRYL) capsule 50 mg  50 mg Oral Q6H PRN [name], DO   50 mg at 09/29/22 2254   Or   diphenhydrAMINE (BENADRYL) injection 50 mg  50 mg Intramuscular Q6H PRN [name], DO       haloperidol (HALDOL) tablet 5 mg  5 mg Oral Q6H PRN [name], DO   5 mg at 09/28/22 2149   Or   haloperidol lactate (HALDOL) injection 5 mg  5 mg Intramuscular Q6H PRN [name], DO       ibuprofen (ADVIL) tablet 600 mg  600 mg Oral Q6H PRN [name], [name], MD   600 mg at 01/18/23 1191   LORazepam (ATIVAN) tablet 1 mg  1 mg Oral TID PRN [name], DO   1 mg at 09/27/22 0025   magnesium hydroxide (MILK OF MAGNESIA) suspension 30 mL  30 mL Oral Daily PRN [name], DO   30 mL at 11/08/22 1012   paliperidone (INVEGA SUSTENNA) injection 156 mg  156 mg Intramuscular Q28 days [name], MD   156 mg at 01/15/23 1501   traZODone (DESYREL) tablet 50 mg  50 mg Oral QHS PRN [name], DO   50 mg at 01/21/23 2054   PTA Medications: Medications Prior to Admission  Medication Sig Dispense Refill Last Dose   ondansetron (ZOFRAN) 4 MG tablet Take 1 tablet (4 mg total) by mouth every 8 (eight) hours as needed for nausea or vomiting.  (Patient not taking: Reported on 08/17/2022) 20 tablet 0     Patient Stressors: Financial difficulties   Health problems   Medication change or noncompliance    Patient Strengths: Ability for insight   Treatment Modalities: Medication Management, Group therapy, Case management,  1 to 1 session with clinician, Psychoeducation, Recreational therapy.   Physician Treatment Plan for Primary Diagnosis: Schizoaffective disorder, bipolar type (HCC) Long Term Goal(s): Improvement in symptoms so as ready for discharge   Short Term Goals: Ability to identify changes in lifestyle to reduce recurrence of condition will improve Ability to verbalize feelings will improve Ability to disclose and discuss suicidal ideas Ability to demonstrate self-control will improve Ability to identify and develop effective coping behaviors will improve Ability to maintain clinical measurements within normal limits will improve Compliance with prescribed medications will improve Ability to identify triggers associated with substance abuse/mental health issues will improve  Medication Management: Evaluate patient's response, side effects, and tolerance of medication regimen.  Therapeutic Interventions: 1 to 1 sessions, Unit Group sessions and Medication administration.  Evaluation of Outcomes: Progressing  Physician Treatment Plan for Secondary Diagnosis: Principal Problem:   Schizoaffective disorder, bipolar type (HCC)  Long Term Goal(s): Improvement in symptoms so as ready for discharge   Short Term Goals: Ability to identify changes in lifestyle to reduce recurrence of condition will improve  Ability to verbalize feelings will improve Ability to disclose and discuss suicidal ideas Ability to demonstrate self-control will improve Ability to identify and develop effective coping behaviors will improve Ability to maintain clinical measurements within normal limits will improve Compliance with prescribed  medications will improve Ability to identify triggers associated with substance abuse/mental health issues will improve     Medication Management: Evaluate patient's response, side effects, and tolerance of medication regimen.  Therapeutic Interventions: 1 to 1 sessions, Unit Group sessions and Medication administration.  Evaluation of Outcomes: Progressing   RN Treatment Plan for Primary Diagnosis: Schizoaffective disorder, bipolar type (HCC) Long Term Goal(s): Knowledge of disease and therapeutic regimen to maintain health will improve  Short Term Goals: Ability to demonstrate self-control, Ability to participate in decision making will improve, Ability to verbalize feelings will improve, Ability to disclose and discuss suicidal ideas, Ability to identify and develop effective coping behaviors will improve, and Compliance with prescribed medications will improve  Medication Management: RN will administer medications as ordered by provider, will assess and evaluate patient's response and provide education to patient for prescribed medication. RN will report any adverse and/or side effects to prescribing provider.  Therapeutic Interventions: 1 on 1 counseling sessions, Psychoeducation, Medication administration, Evaluate responses to treatment, Monitor vital signs and CBGs as ordered, Perform/monitor CIWA, COWS, AIMS and Fall Risk screenings as ordered, Perform wound care treatments as ordered.  Evaluation of Outcomes: Progressing   LCSW Treatment Plan for Primary Diagnosis: Schizoaffective disorder, bipolar type (HCC) Long Term Goal(s): Safe transition to appropriate next level of care at discharge, Engage patient in therapeutic group addressing interpersonal concerns.  Short Term Goals: Engage patient in aftercare planning with referrals and resources, Increase social support, Increase ability to appropriately verbalize feelings, Increase emotional regulation, Facilitate acceptance of  mental health diagnosis and concerns, and Increase skills for wellness and recovery  Therapeutic Interventions: Assess for all discharge needs, 1 to 1 time with Social worker, Explore available resources and support systems, Assess for adequacy in community support network, Educate family and significant other(s) on suicide prevention, Complete Psychosocial Assessment, Interpersonal group therapy.  Evaluation of Outcomes: Progressing   Progress in Treatment: Attending groups: Yes. Participating in groups: Yes. Taking medication as prescribed: Yes. Toleration medication: Yes. Family/Significant other contact made: Yes, individual(s) contacted:  SPE completed with the patients son Patient understands diagnosis: Yes. Discussing patient identified problems/goals with staff: Yes. Medical problems stabilized or resolved: Yes. Denies suicidal/homicidal ideation: Yes. Issues/concerns per patient self-inventory: No. Other: none   New problem(s) identified: No, Describe:  none Updates 12/12/22: No changes made at this time Updates 12/17/22: No changes made at this time Update 12/24/22 No changes at this time. Update 12/29/2022: No changes at this time. Update 01/03/23: No changes at this time Update 01/08/23: No changes at this time Update 01/13/23: No changes at this time Update 01/18/23: None at this time. Update 01/23/2023:  No changes at this time.    New Short Term/Long Term Goal(s): Update 6/30: none at this time. Update 09/27/2022:  No changes at this time.  Update 10/02/2022:  No changes at this time. Update 10/07/22: No changes at this time 10/12/22: No changes at this time Update 10/18/22: No changes at this time Update 10/23/22: No changes at this time Update 10/28/22: No changes at this time Update 11/07/22: No changes at this time Update 11/12/22: No changes at this time  Update 11/17/22: None at this time. Update 11/22/22: None at this time. Update 11/27/22 No changes at this  time. Update 12/02/22 No changes  at this time  Update 12/07/22: None at this time. Updates 12/12/22: No changes made at this time Updates 12/17/22: No changes made at this time Update 12/24/22 No changes at this time. Update 12/29/2022: No changes at this time. Update 01/03/23: No changes at this time Update 01/08/23: No changes at this time Update 01/13/23: No changes at this time   Update 01/18/23: No changes at this time. Update 01/23/2023:  No changes at this time.    Patient Goals:  Update 6/30: none at this time. Update 09/27/2022:  No changes at this time. Update 10/02/2022:  No changes at this time. Update 10/07/22: No changes at this time 10/12/22: No changes at this time Update 10/18/22: No changes at this time Update 10/23/22: No changes at this time Update 10/28/22: No changes at this time Update 11/07/22: No changes at this time Update 11/12/22: No changes at this time Update 11/17/22: None at this time. Update 11/22/22: None at this time. Update 11/27/22 No changes at this time Update 12/02/22 No changes at this time   Update 12/07/22: None at this time. Updates 12/12/22: No changes made at this time Updates 12/17/22: No changes made at this time Update 12/24/22 No changes at this time. Update 12/29/2022: No changes at this time. Update 01/03/23: No changes at this time Update 01/08/23: No changes at this time Update 01/13/23: No changes at this time   Update 01/18/23: No changes at this time. Update 01/23/2023:  No changes at this time.     Discharge Plan or Barriers: Update 6/30: APS report has been made and patient is being investigated for guardianship needs.  Remains homeless with limited supports. Update 09/27/2022:  No changes at this time.  Update 10/02/2022:  Patient remains safe on the unit at this time.  Patient remains psychotic at this time.  APS report has been made, however, no follow up from the caseworker on her case.  No safe discharge identified.  CSW has requested that application for Medicaid and disability be completed.   Update 10/07/22:  No changes at this time 10/12/22: No changes at this time Update 10/18/22: No changes at this time Update 10/23/22: No changes at this time Update 10/23/22: No changes at this time Update 10/28/22: According to [facility], pt's caseworker at DSS, the petition was filed for guardianship on 10/21/22, now awaiting for the petition to be approvedUpdate 11/07/22: No changes at this time Update 11/12/22: CSW sent over patients information to [facility] and [facility], awaiting a response. CSW awaiting word from DSS regarding pt's petition and what agency will become her guardian. Update 11/17/22: No changes at this time. Update 11/22/22: CSW has sent patients information to multiple facilities that were suggested by leadership, pt has been denied from the facility or the facility does not respond. CSW continues to send pt's information to nursing homes. Update 11/27/22 CSW contacted DSS to inquire about the status of guardianship. No response at this time Update 12/02/22 Supervisor Loraine Leriche contacted DSS who stated they are not taking pt on for guardianship but that Quincy Carnes will be present tomorrow to do a visit with social worker and pt and that pt's son will be signing documentation for placement for the pt. Update 12/07/22: [facility] DSS has declined to take guardianship. Son, Casimiro Needle is to step into a more present role in deciding placement. A conversation is still needed. Updates 12/12/22: CSW staffed case with leadership and they state that a family  meeting must happen with pt's son. CSW contacted pt's financial navigator and her DSS worker so that they may be present on the call and are able to speak to the efforts they have made to secure pt funding and housing. CSW waiting on responses from both, leadership is aware. Updates 12/17/22: CSW awaiting updates from both [facility] DSS and [facility] financial navigators as to status of pt's applications for Medicaid/[employer] and Disability, respectively.  Update 12/24/22 CSW sent email regarding family meeting per request of supervisors. CSW awaiting responses from all parties to schedule family meeting. Update: 12/29/2022 No changes at this time. Update 01/03/23: CSW continues to await responses on the scheduling of the family meeting. CSW received call from Primitivo Gauze at [employer], CSW sent FL2 to [place] to aide in pt's discharge planning. Update 01/08/23: CSW has reached out again to leadership and financial counseling. CSW continues to await a response. CSW called Sharlee Blew, awaiting a response for status of pt's placement options. Update 01/13/23: Pt is to meet with psychologist tomorrow 10/22 to complete capacity testing so that DSS can retry for guardianship  Update 01/18/23: The patient is still awaiting placement.  Update 01/23/2023:  Patient remains safe on the unit at this time.  CSW team continues to work on placement.  CSW team in contact with DSS to develop plan on guardianship.   Work continues to Hospital doctor.   Reason for Continuation of Hospitalization: Hallucinations Mania Medication stabilization   Estimated Length of Stay:  Update 6/30: 1-7 days Update 09/27/2022:  TBD Update 10/02/2022:  No changes at this time. UpdaUpdate 10/18/22: No changes at this time te 10/07/22: No changes at this time Update 10/18/22: No changes at this time Update 10/23/22: No changes at this time Update 10/28/22: No changes at this time Update 11/07/22: No changes at this time Update 11/12/22: No changes at this time  Update 11/17/22: No changes at this time. Update 11/22/22: None at this time. Update 11/27/22 No changes at this time. Update 12/02/22 No changes at this time  Update 12/07/22: None at this time. Updates 12/12/22: No changes made at this time Updates 12/17/22: TBD. Update 12/29/2022: No changes at this time. Update 01/03/23: TBDUpdate 01/08/23: TBD Update 01/13/23: TBD  Update 01/18/23: No changes at this time.  Update 01/23/2023:  TBD  Last 3  [place] Suicide Severity Risk Score: Flowsheet Row Admission (Current) from 08/22/2022 in [facility] ED from 08/17/2022 in [facility] Emergency Department at [facility] ED from 08/12/2022 in [facility] Emergency Department at [facility]  C-SSRS RISK CATEGORY Low Risk No Risk No Risk       Last [facility] 2/9 Scores:     No data to display          Scribe for Treatment Team: Harden Mo, Alexander Mt 01/23/2023 11:27 AM

## 2023-01-23 NOTE — Plan of Care (Signed)
  Problem: Education: Goal: Knowledge of General Education information will improve Description: Including pain rating scale, medication(s)/side effects and non-pharmacologic comfort measures Outcome: Progressing   Problem: Health Behavior/Discharge Planning: Goal: Ability to manage health-related needs will improve Outcome: Progressing   Problem: Clinical Measurements: Goal: Ability to maintain clinical measurements within normal limits will improve Outcome: Progressing Goal: Diagnostic test results will improve Outcome: Progressing   Problem: Nutrition: Goal: Adequate nutrition will be maintained Outcome: Progressing   Problem: Coping: Goal: Level of anxiety will decrease Outcome: Progressing   Problem: Elimination: Goal: Will not experience complications related to bowel motility Outcome: Progressing Goal: Will not experience complications related to urinary retention Outcome: Progressing   Problem: Pain Managment: Goal: General experience of comfort will improve Outcome: Progressing   Problem: Safety: Goal: Ability to remain free from injury will improve Outcome: Progressing   Problem: Skin Integrity: Goal: Risk for impaired skin integrity will decrease Outcome: Progressing

## 2023-01-23 NOTE — Progress Notes (Addendum)
[facility] MD Progress Note  Betty Henderson  MRN:  [id number]  Subjective: Case discussed in multidisciplinary meeting today, chart reviewed, patient seen today during rounds.  Social worker shared that patient had psychological testing done, according to report patient has capacity to take care of self.  Social worker is working with DSS on placement.  Patient reports she is doing fine today.  No new acute issues overnight reported by staff or patient.  Patient reports her sleep is "good".  Patient has been attending groups and participating in milieu.  She is interacting well with her peers.  Patient denies auditory or visual hallucinations.  She was heard singing in her room.  Principal Problem: Schizoaffective disorder, bipolar type (HCC) Diagnosis: Principal Problem:   Schizoaffective disorder, bipolar type (HCC)   Past Psychiatric History: Schizoaffective disorder, bipolar type.  Past Medical History:  Past Medical History:  Diagnosis Date   Anemia    Arthritis    Chronic pain    Drug-seeking behavior    Malingering    Osteopetrosis    Psychosis (HCC)    Schizoaffective disorder, bipolar type (HCC)     Past Surgical History:  Procedure Laterality Date   MOUTH SURGERY     TUBAL LIGATION     Family History:  Family History  Family history unknown: Yes   Family Psychiatric  History: Unremarkable Social History:  Social History   Substance and Sexual Activity  Alcohol Use Not Currently   Comment: 1 cocktail 3 weeks ago     Social History   Substance and Sexual Activity  Drug Use Not Currently   Types: Cocaine   Comment: states "it's legal though"    Social History   Socioeconomic History   Marital status: Single    Spouse name: Not on file   Number of children: Not on file   Years of education: Not on file   Highest education level: Not on file  Occupational History   Not on file  Tobacco Use   Smoking status: Every Day    Current packs/day: 0.50     Types: Cigarettes   Smokeless tobacco: Never  Vaping Use   Vaping status: Never Used  Substance and Sexual Activity   Alcohol use: Not Currently    Comment: 1 cocktail 3 weeks ago   Drug use: Not Currently    Types: Cocaine    Comment: states "it's legal though"   Sexual activity: Never  Other Topics Concern   Not on file  Social History Narrative   Not on file   Social Determinants of Health   Financial Resource Strain: Not on file  Food Insecurity: Food Insecurity Present (08/22/2022)   Hunger Vital Sign    Worried About Running Out of Food in the Last Year: Sometimes true    Ran Out of Food in the Last Year: Sometimes true  Transportation Needs: Patient Unable To Answer (08/22/2022)   PRAPARE - Transportation    Lack of Transportation (Medical): Patient unable to answer    Lack of Transportation (Non-Medical): Patient unable to answer  Recent Concern: Transportation Needs - Unmet Transportation Needs (06/13/2022)   Received from [facility], [facility]   [facility] - Transportation    Lack of Transportation (Medical): Not on file    Lack of Transportation (Non-Medical): Yes  Physical Activity: Not on file  Stress: Not on file  Social Connections: Unknown (08/06/2021)   Received from [facility], [facility]   Social Network    Social Network:  Not on file   Additional Social History:     Patient is homeless at this time.  Social worker is working with DSS on [employer] application and  placement.                    Sleep Good  Appetite:  Good  Current Medications: Current Facility-Administered Medications  Medication Dose Route Frequency Provider Last Rate Last Admin   acetaminophen (TYLENOL) tablet 650 mg  650 mg Oral Q6H PRN [name], DO   650 mg at 01/22/23 2109   alum & mag hydroxide-simeth (MAALOX/MYLANTA) 200-200-20 MG/5ML suspension 30 mL  30 mL Oral Q4H PRN [name], DO   30 mL at 09/20/22 1142   diphenhydrAMINE  (BENADRYL) capsule 50 mg  50 mg Oral Q6H PRN [name], DO   50 mg at 09/29/22 2254   Or   diphenhydrAMINE (BENADRYL) injection 50 mg  50 mg Intramuscular Q6H PRN [name], DO       haloperidol (HALDOL) tablet 5 mg  5 mg Oral Q6H PRN [name], DO   5 mg at 09/28/22 2149   Or   haloperidol lactate (HALDOL) injection 5 mg  5 mg Intramuscular Q6H PRN [name], DO       ibuprofen (ADVIL) tablet 600 mg  600 mg Oral Q6H PRN [name], [name], MD   600 mg at 01/18/23 1610   LORazepam (ATIVAN) tablet 1 mg  1 mg Oral TID PRN [name], DO   1 mg at 09/27/22 0025   magnesium hydroxide (MILK OF MAGNESIA) suspension 30 mL  30 mL Oral Daily PRN [name], DO   30 mL at 11/08/22 1012   paliperidone (INVEGA SUSTENNA) injection 156 mg  156 mg Intramuscular Q28 days [name], MD   156 mg at 01/15/23 1501   traZODone (DESYREL) tablet 50 mg  50 mg Oral QHS PRN [name], DO   50 mg at 01/21/23 2054    Lab Results: No results found for this or any previous visit (from the past 48 hour(s)).  Blood Alcohol level:  Lab Results  Component Value Date   ETH <10 08/17/2022   ETH <10 01/11/2021      Musculoskeletal: Strength & Muscle Tone: within normal limits Gait & Station: normal Patient leans: N/A   Psychiatric Specialty Exam:   Presentation  General Appearance:  Casual; Neat   Eye Contact: Fair   Speech: Spontaneous   Speech Volume: Normal   Handedness: Right     Mood and Affect  Mood: "Fine"   Affect: Stable and pleasant    Thought Processes: Improved, at baseline   Descriptions of Associations: Intact   Orientation: Well-oriented   Thought Content: Improved, at baseline  Hallucinations:Denies, occasionally heard talking to self in her room  Ideas of Reference:None noted   Suicidal Thoughts:Denies SI, no intention or plan  Homicidal Thoughts:Denies HI, no  intention or plan   Sensorium  Memory: Immediate Fair; Remote Poor   Judgment: Fair, pt is Rx compliant   Insight: Improved     Executive Functions  Concentration: Improved   Attention Span: Improved   Language: Fair     Psychomotor Activity  Psychomotor Activity: Normal  Assets  Assets: Communication Skills     Sleep  Sleep:Improved     Physical Exam: Physical Exam Vitals and nursing note reviewed.  Constitutional:      Appearance: Normal appearance. She is normal  weight.  Neurological:     General: No focal deficit present.     Mental Status: She is alert.      Review of Systems  Constitutional: Negative.   HENT: Negative.    Eyes: Negative.   Respiratory: Negative.    Cardiovascular: Negative.   Gastrointestinal: Negative.   Genitourinary: Negative.   Musculoskeletal: Negative.   Skin: Negative.   Neurological: Negative.   Endo/Heme/Allergies: Negative.     Blood pressure 109/60, pulse 82, temperature (!) 97.5 F (36.4 C), resp. rate 18, height 5\' 6"  (1.676 m), weight (P) 82.8 kg, SpO2 100%. Body mass index is 29.46 kg/m (pended).   Treatment Plan Summary: Daily contact with patient to assess and evaluate symptoms and progress in treatment, Medication management, and Plan continue current medications.  Continue to monitor patient on as needed meds   Patient received  dose of Invega Sustenna 156 mg IM on  01/15/23  Patient is awaiting placement    [name], MD

## 2023-01-23 NOTE — Group Note (Signed)
Recreation Therapy Group Note   Group Topic:Communication  Group Date: 01/23/2023 Start Time: 1400 End Time: 1450 Facilitators: Rosina Lowenstein, LRT, CTRS Location: Courtyard  Group Description: Halloween Trivia. LRT reads off trivia question for patients to hear while also giving them a copy of the trivia questions. Pts are given a set time limit to answer the question correctly. LRT waits for all patients to make a guess out loud. LRT facilitated post-game discussion on the importance of working well with others, active listening, and communicating. LRT and pts discussed how this can apply to life post-discharge. LRT passed out candy afterwards with RN approval.   Goal Area(s) Addressed: Patient will increase communication skills.  Patient will increase frustration tolerance skills. Patient will practice active listening.    Affect/Mood: Appropriate   Participation Level: Active and Engaged   Participation Quality: Independent   Behavior: Appropriate, Calm, and Cooperative   Speech/Thought Process: Coherent   Insight: Good   Judgement: Good   Modes of Intervention: Guided Discussion   Patient Response to Interventions:  Attentive, Engaged, Interested , and Receptive   Education Outcome:  Acknowledges education   Clinical Observations/Individualized Feedback: Ashi was active in their participation of session activities and group discussion. Pt interacted well with LRT and peers duration of session.    Plan: Continue to engage patient in RT group sessions 2-3x/week.   Rosina Lowenstein, LRT, CTRS 01/23/2023 2:59 PM

## 2023-01-24 DIAGNOSIS — F250 Schizoaffective disorder, bipolar type: Secondary | ICD-10-CM | POA: Diagnosis not present

## 2023-01-24 MED ORDER — OXYBUTYNIN CHLORIDE 5 MG PO TABS
5.0000 mg | ORAL_TABLET | Freq: Two times a day (BID) | ORAL | Status: DC
  Administered 2023-01-24 – 2023-03-28 (×126): 5 mg via ORAL
  Filled 2023-01-24 (×128): qty 1

## 2023-01-24 NOTE — Group Note (Signed)
Date:  01/24/2023 Time:  8:30 PM  Group Topic/Focus:  Goals Group:   The focus of this group is to help patients establish daily goals to achieve during treatment and discuss how the patient can incorporate goal setting into their daily lives to aide in recovery.    Participation Level:  Active  Participation Quality:  Appropriate  Affect:  Appropriate  Cognitive:  Appropriate  Insight: Appropriate  Engagement in Group:  Engaged  Modes of Intervention:  Discussion  Additional Comments:    [name] 01/24/2023, 8:30 PM

## 2023-01-24 NOTE — Progress Notes (Signed)
 Patient to nurses' station asking for shower supplies and scrubs.  Pt had 2nd episode of incontinence.  Bed cleaned and linen changed by MHT.

## 2023-01-24 NOTE — Group Note (Signed)
Date:  01/24/2023 Time:  9:57 AM  Group Topic/Focus:  Movement Therapy    Participation Level:  Active  Participation Quality:  Appropriate  Affect:  Appropriate  Cognitive:  Appropriate  Insight: Appropriate  Engagement in Group:  Engaged  Modes of Intervention:  Activity  Additional Comments:  None  Rodena Goldmann 01/24/2023, 9:57 AM

## 2023-01-24 NOTE — Progress Notes (Signed)
[facility] MD Progress Note  Betty Henderson  MRN:  [id number]  Subjective: Case discussed in multidisciplinary meeting today, chart reviewed, patient seen today during rounds.  Social worker shared that patient had psychological testing done, according to report patient has capacity to take care of self.  Social worker is working with DSS on placement.  Patient reports she is doing fine today.  She was observed sitting in day area interacting with peers.  No new acute issues overnight reported by staff or patient except for urinary incontinence.  Patient is requesting a medicine to help with urinary incontinence.  Other than that patient is doing fine on the unit.  Patient reports her sleep is "good".  Patient has been attending groups and participating in milieu.  She is interacting well with her peers.  Patient denies auditory or visual hallucinations.    Principal Problem: Schizoaffective disorder, bipolar type (HCC) Diagnosis: Principal Problem:   Schizoaffective disorder, bipolar type (HCC)   Past Psychiatric History: Schizoaffective disorder, bipolar type.  Past Medical History:  Past Medical History:  Diagnosis Date   Anemia    Arthritis    Chronic pain    Drug-seeking behavior    Malingering    Osteopetrosis    Psychosis (HCC)    Schizoaffective disorder, bipolar type (HCC)     Past Surgical History:  Procedure Laterality Date   MOUTH SURGERY     TUBAL LIGATION     Family History:  Family History  Family history unknown: Yes   Family Psychiatric  History: Unremarkable Social History:  Social History   Substance and Sexual Activity  Alcohol Use Not Currently   Comment: 1 cocktail 3 weeks ago     Social History   Substance and Sexual Activity  Drug Use Not Currently   Types: Cocaine   Comment: states "it's legal though"    Social History   Socioeconomic History   Marital status: Single    Spouse name: Not on file   Number of children: Not on file   Years  of education: Not on file   Highest education level: Not on file  Occupational History   Not on file  Tobacco Use   Smoking status: Every Day    Current packs/day: 0.50    Types: Cigarettes   Smokeless tobacco: Never  Vaping Use   Vaping status: Never Used  Substance and Sexual Activity   Alcohol use: Not Currently    Comment: 1 cocktail 3 weeks ago   Drug use: Not Currently    Types: Cocaine    Comment: states "it's legal though"   Sexual activity: Never  Other Topics Concern   Not on file  Social History Narrative   Not on file   Social Determinants of Health   Financial Resource Strain: Not on file  Food Insecurity: Food Insecurity Present (08/22/2022)   Hunger Vital Sign    Worried About Running Out of Food in the Last Year: Sometimes true    Ran Out of Food in the Last Year: Sometimes true  Transportation Needs: Patient Unable To Answer (08/22/2022)   PRAPARE - Transportation    Lack of Transportation (Medical): Patient unable to answer    Lack of Transportation (Non-Medical): Patient unable to answer  Recent Concern: Transportation Needs - Unmet Transportation Needs (06/13/2022)   Received from [facility], [facility]   [facility] - Transportation    Lack of Transportation (Medical): Not on file    Lack of Transportation (Non-Medical): Yes  Physical  Activity: Not on file  Stress: Not on file  Social Connections: Unknown (08/06/2021)   Received from [facility], [facility]   Social Network    Social Network: Not on file   Additional Social History:     Patient is homeless at this time.  Social worker is working with DSS on [employer] application and  placement.                    Sleep Good  Appetite:  Good  Current Medications: Current Facility-Administered Medications  Medication Dose Route Frequency Provider Last Rate Last Admin   acetaminophen (TYLENOL) tablet 650 mg  650 mg Oral Q6H PRN [name], DO   650 mg at 01/23/23 2132    alum & mag hydroxide-simeth (MAALOX/MYLANTA) 200-200-20 MG/5ML suspension 30 mL  30 mL Oral Q4H PRN [name], DO   30 mL at 09/20/22 1142   diphenhydrAMINE (BENADRYL) capsule 50 mg  50 mg Oral Q6H PRN [name], DO   50 mg at 09/29/22 2254   Or   diphenhydrAMINE (BENADRYL) injection 50 mg  50 mg Intramuscular Q6H PRN [name], DO       haloperidol (HALDOL) tablet 5 mg  5 mg Oral Q6H PRN [name], DO   5 mg at 09/28/22 2149   Or   haloperidol lactate (HALDOL) injection 5 mg  5 mg Intramuscular Q6H PRN [name], DO       ibuprofen (ADVIL) tablet 600 mg  600 mg Oral Q6H PRN [name], [name], MD   600 mg at 01/18/23 8119   LORazepam (ATIVAN) tablet 1 mg  1 mg Oral TID PRN [name], DO   1 mg at 01/23/23 2133   magnesium hydroxide (MILK OF MAGNESIA) suspension 30 mL  30 mL Oral Daily PRN [name], DO   30 mL at 11/08/22 1012   paliperidone (INVEGA SUSTENNA) injection 156 mg  156 mg Intramuscular Q28 days [name], MD   156 mg at 01/15/23 1501   traZODone (DESYREL) tablet 50 mg  50 mg Oral QHS PRN [name], DO   50 mg at 01/23/23 2133    Lab Results: No results found for this or any previous visit (from the past 48 hour(s)).  Blood Alcohol level:  Lab Results  Component Value Date   ETH <10 08/17/2022   ETH <10 01/11/2021      Musculoskeletal: Strength & Muscle Tone: within normal limits Gait & Station: normal Patient leans: N/A   Psychiatric Specialty Exam:   Presentation  General Appearance:  Casual; Neat   Eye Contact: Fair   Speech: Spontaneous   Speech Volume: Normal   Handedness: Right     Mood and Affect  Mood: "Fine"   Affect: Stable and pleasant    Thought Processes: Improved, at baseline   Descriptions of Associations: Intact   Orientation: Well-oriented   Thought Content: Improved, at baseline  Hallucinations:Denies,  occasionally heard talking to self in her room  Ideas of Reference:None noted   Suicidal Thoughts:Denies SI, no intention or plan  Homicidal Thoughts:Denies HI, no intention or plan   Sensorium  Memory: Immediate Fair; Remote Poor   Judgment: Fair, pt is Rx compliant   Insight: Improved     Executive Functions  Concentration: Improved   Attention Span: Improved   Language: Fair     Psychomotor Activity  Psychomotor Activity: Normal  Assets  Assets: Communication Skills  Sleep  Sleep:Improved     Physical Exam: Physical Exam Vitals and nursing note reviewed.  Constitutional:      Appearance: Normal appearance. She is normal weight.  Neurological:     General: No focal deficit present.     Mental Status: She is alert.      Review of Systems  Constitutional: Negative.   HENT: Negative.    Eyes: Negative.   Respiratory: Negative.    Cardiovascular: Negative.   Gastrointestinal: Negative.   Genitourinary: Negative.   Musculoskeletal: Negative.   Skin: Negative.   Neurological: Negative.   Endo/Heme/Allergies: Negative.     Blood pressure 102/66, pulse 75, temperature 98.6 F (37 C), resp. rate 14, height 5\' 6"  (1.676 m), weight (P) 82.8 kg, SpO2 98%. Body mass index is 29.46 kg/m (pended).   Treatment Plan Summary: Daily contact with patient to assess and evaluate symptoms and progress in treatment, Medication management, and Plan continue current medications.  Continue to monitor patient on as needed meds   Patient received  dose of Invega Sustenna 156 mg IM on  01/15/23  Patient is awaiting placement    [name], MD

## 2023-01-24 NOTE — Plan of Care (Signed)
  Problem: Pain Managment: Goal: General experience of comfort will improve Outcome: Progressing   Problem: Safety: Goal: Ability to remain free from injury will improve Outcome: Progressing   

## 2023-01-24 NOTE — Group Note (Unsigned)
Date:  01/24/2023 Time:  9:59 AM  Group Topic/Focus:  Movement therapy     Participation Level:  {BHH PARTICIPATION LEVEL:22264}  Participation Quality:  {BHH PARTICIPATION QUALITY:22265}  Affect:  {BHH AFFECT:22266}  Cognitive:  {BHH COGNITIVE:22267}  Insight: {BHH Insight2:20797}  Engagement in Group:  {BHH ENGAGEMENT IN GNFAO:13086}  Modes of Intervention:  {BHH MODES OF INTERVENTION:22269}  Additional Comments:  ***  Rodena Goldmann 01/24/2023, 9:59 AM

## 2023-01-24 NOTE — Group Note (Signed)
LCSW Group Therapy Note  Group Date: 01/24/2023 Start Time: 1500 End Time: 1545   Type of Therapy and Topic:  Group Therapy - Healthy vs Unhealthy Coping Skills  Participation Level:  Did Not Attend   Description of Group The focus of this group was to determine what unhealthy coping techniques typically are used by group members and what healthy coping techniques would be helpful in coping with various problems. Patients were guided in becoming aware of the differences between healthy and unhealthy coping techniques. Patients were asked to identify 2-3 healthy coping skills they would like to learn to use more effectively.  Therapeutic Goals Patients learned that coping is what human beings do all day long to deal with various situations in their lives Patients defined and discussed healthy vs unhealthy coping techniques Patients identified their preferred coping techniques and identified whether these were healthy or unhealthy Patients determined 2-3 healthy coping skills they would like to become more familiar with and use more often. Patients provided support and ideas to each other   Summary of Patient Progress:  X   Therapeutic Modalities Cognitive Behavioral Therapy Motivational Interviewing  Betty Henderson, [place] 01/24/2023  4:09 PM

## 2023-01-24 NOTE — Group Note (Signed)
Date:  01/24/2023 Time:  12:33 AM  Group Topic/Focus:  Crisis Planning:   The purpose of this group is to help patients create a crisis plan for use upon discharge or in the future, as needed.    Participation Level:  Did Not Attend  Participation Quality:      Affect:      Cognitive:      Insight: None  Engagement in Group:  None  Modes of Intervention:      Additional Comments:    [name] 01/24/2023, 12:33 AM

## 2023-01-24 NOTE — Progress Notes (Addendum)
 Patient with sad affect.  Present in the dayroom for breakfast. Denies SI/HI and AVH.  Denies anxiety and depression.  Denies pain.  Isolates to room with the exception of meals and groups.  Minimal interaction with peers and staff.  No scheduled medications this shift.  15 min checks in place for patient safety.   1 episode of urine incontinence in dayroom.  Patient given pull and clean scrubs.  Able to clean herself.

## 2023-01-24 NOTE — Plan of Care (Signed)
  Problem: Nutrition: Goal: Adequate nutrition will be maintained Outcome: Progressing   Problem: Coping: Goal: Level of anxiety will decrease Outcome: Progressing  Patient edorses anxiety and sleep distubance this shift PRN ativan and Trazodone given and effective. She continues to have a sad affect. Compliant with treatment plan. Denies SI/HI/A/VH at present. Support and encouragement provided as needed.

## 2023-01-24 NOTE — Group Note (Signed)
Recreation Therapy Group Note   Group Topic:Relaxation  Group Date: 01/24/2023 Start Time: 1400 End Time: 1440 Facilitators: Rosina Lowenstein, LRT, CTRS Location: Courtyard  Group Description: Meditation. LRT asks patients their current level of stress/anxiety from 1-10, with 10 being the highest. LRT educated on the benefits of meditation and relaxation techniques, and how it can apply to everyday life post-discharge. LRT and pt's followed along to an audio script of a "guided meditation" video. LRT asked pt their level of stress and anxiety once the prompt was finished. LRT facilitated post-activity processing to gain feedback on session.    Goal Area(s) Addressed:  Patient will practice using relaxation technique. Patient will identify a new coping skill.  Patient will follow multistep directions to reduce anxiety and stress.   Affect/Mood: Appropriate   Participation Level: Active and Engaged   Participation Quality: Independent   Behavior: Appropriate, Calm, and Cooperative   Speech/Thought Process: Coherent   Insight: Good   Judgement: Good   Modes of Intervention: Activity   Patient Response to Interventions:  Attentive, Engaged, Interested , and Receptive   Education Outcome:  Acknowledges education   Clinical Observations/Individualized Feedback: [place]area was active in their participation of session activities and group discussion. Pt identified that her stress and anxiety were a 10 before the session. After, she rated it it a 2. Pt interacted well with LRT and peers duration of session.   Plan: Continue to engage patient in RT group sessions 2-3x/week.   Rosina Lowenstein, LRT, CTRS 01/24/2023 3:20 PM

## 2023-01-25 DIAGNOSIS — F250 Schizoaffective disorder, bipolar type: Secondary | ICD-10-CM | POA: Diagnosis not present

## 2023-01-25 LAB — URINALYSIS, ROUTINE W REFLEX MICROSCOPIC
Bacteria, UA: NONE SEEN
Bilirubin Urine: NEGATIVE
Glucose, UA: >=500 mg/dL — AB
Hgb urine dipstick: NEGATIVE
Ketones, ur: NEGATIVE mg/dL
Leukocytes,Ua: NEGATIVE
Nitrite: NEGATIVE
Protein, ur: NEGATIVE mg/dL
Specific Gravity, Urine: 1.025 (ref 1.005–1.030)
pH: 5 (ref 5.0–8.0)

## 2023-01-25 NOTE — Plan of Care (Signed)
Patient complaint with medications visible in Milieu this shift. Denies SI/HI/A/VH and verbally contracts for safety. Q 15 minutes safety checks ongoing.

## 2023-01-25 NOTE — Group Note (Signed)
LCSW Group Therapy Note   Group Date: 01/25/2023 Start Time: 1345 End Time: 1435   Type of Therapy and Topic:  Group Therapy:  Awareness of Self and Feelings  Participation Level:  Active   Summary of Patient Progress:  Patient was alert and active in group. Patient was able to describe the impact that feelings have on behaviors.     Whitney Post, LCSWA 01/25/2023  3:56 PM

## 2023-01-25 NOTE — Plan of Care (Signed)
Patient complaint with medications visible in Milieu this shift. Denies SI/HI/A/VH and verbally contracts for safety.Requested for Trazodone for sleep and Tylenol for hip pain. Prn given at [facility] medications were effective.  Q 15 minutes safety checks ongoing. Patient ambulating with front wheel walker all fall protocol in place. Patient remains safe.

## 2023-01-25 NOTE — Progress Notes (Signed)
[facility] MD Progress Note  Betty Henderson  MRN:  [id number]  Subjective: Case discussed in multidisciplinary meeting today, chart reviewed, patient seen today during rounds.  Social worker shared that patient had psychological testing done, according to report patient has capacity to take care of self.  Social worker is working with DSS on placement.  Patient reports she is doing fine today.  She was observed sitting in day area interacting with peers.  No new acute issues overnight reported by staff or patient except for urinary incontinence.  Patient was started on oxybutynin yesterday.   Patient reports her sleep is "good".  Patient has been attending groups and participating in milieu.  She is interacting well with her peers.  Patient denies auditory or visual hallucinations. Will order UA, CBC, and CMP   Principal Problem: Schizoaffective disorder, bipolar type (HCC) Diagnosis: Principal Problem:   Schizoaffective disorder, bipolar type (HCC)   Past Psychiatric History: Schizoaffective disorder, bipolar type.  Past Medical History:  Past Medical History:  Diagnosis Date   Anemia    Arthritis    Chronic pain    Drug-seeking behavior    Malingering    Osteopetrosis    Psychosis (HCC)    Schizoaffective disorder, bipolar type (HCC)     Past Surgical History:  Procedure Laterality Date   MOUTH SURGERY     TUBAL LIGATION     Family History:  Family History  Family history unknown: Yes   Family Psychiatric  History: Unremarkable Social History:  Social History   Substance and Sexual Activity  Alcohol Use Not Currently   Comment: 1 cocktail 3 weeks ago     Social History   Substance and Sexual Activity  Drug Use Not Currently   Types: Cocaine   Comment: states "it's legal though"    Social History   Socioeconomic History   Marital status: Single    Spouse name: Not on file   Number of children: Not on file   Years of education: Not on file   Highest education  level: Not on file  Occupational History   Not on file  Tobacco Use   Smoking status: Every Day    Current packs/day: 0.50    Types: Cigarettes   Smokeless tobacco: Never  Vaping Use   Vaping status: Never Used  Substance and Sexual Activity   Alcohol use: Not Currently    Comment: 1 cocktail 3 weeks ago   Drug use: Not Currently    Types: Cocaine    Comment: states "it's legal though"   Sexual activity: Never  Other Topics Concern   Not on file  Social History Narrative   Not on file   Social Determinants of Health   Financial Resource Strain: Not on file  Food Insecurity: Food Insecurity Present (08/22/2022)   Hunger Vital Sign    Worried About Running Out of Food in the Last Year: Sometimes true    Ran Out of Food in the Last Year: Sometimes true  Transportation Needs: Patient Unable To Answer (08/22/2022)   PRAPARE - Transportation    Lack of Transportation (Medical): Patient unable to answer    Lack of Transportation (Non-Medical): Patient unable to answer  Recent Concern: Transportation Needs - Unmet Transportation Needs (06/13/2022)   Received from [facility], [facility]   [facility] - Transportation    Lack of Transportation (Medical): Not on file    Lack of Transportation (Non-Medical): Yes  Physical Activity: Not on file  Stress: Not on file  Social Connections: Unknown (08/06/2021)   Received from [facility], [facility]   Social Network    Social Network: Not on file   Additional Social History:     Patient is homeless at this time.  Social worker is working with DSS on [employer] application and  placement.                    Sleep Good  Appetite:  Good  Current Medications: Current Facility-Administered Medications  Medication Dose Route Frequency Provider Last Rate Last Admin   acetaminophen (TYLENOL) tablet 650 mg  650 mg Oral Q6H PRN [name], DO   650 mg at 01/24/23 2139   alum & mag hydroxide-simeth (MAALOX/MYLANTA)  200-200-20 MG/5ML suspension 30 mL  30 mL Oral Q4H PRN [name], DO   30 mL at 09/20/22 1142   diphenhydrAMINE (BENADRYL) capsule 50 mg  50 mg Oral Q6H PRN [name], DO   50 mg at 09/29/22 2254   Or   diphenhydrAMINE (BENADRYL) injection 50 mg  50 mg Intramuscular Q6H PRN [name], DO       haloperidol (HALDOL) tablet 5 mg  5 mg Oral Q6H PRN [name], DO   5 mg at 09/28/22 2149   Or   haloperidol lactate (HALDOL) injection 5 mg  5 mg Intramuscular Q6H PRN [name], DO       ibuprofen (ADVIL) tablet 600 mg  600 mg Oral Q6H PRN [name], [name], MD   600 mg at 01/18/23 6440   LORazepam (ATIVAN) tablet 1 mg  1 mg Oral TID PRN [name], DO   1 mg at 01/23/23 2133   magnesium hydroxide (MILK OF MAGNESIA) suspension 30 mL  30 mL Oral Daily PRN [name], DO   30 mL at 11/08/22 1012   oxybutynin (DITROPAN) tablet 5 mg  5 mg Oral BID [name], MD   5 mg at 01/25/23 0913   paliperidone (INVEGA SUSTENNA) injection 156 mg  156 mg Intramuscular Q28 days [name], MD   156 mg at 01/15/23 1501   traZODone (DESYREL) tablet 50 mg  50 mg Oral QHS PRN [name], DO   50 mg at 01/24/23 2139    Lab Results: No results found for this or any previous visit (from the past 48 hour(s)).  Blood Alcohol level:  Lab Results  Component Value Date   ETH <10 08/17/2022   ETH <10 01/11/2021      Musculoskeletal: Strength & Muscle Tone: within normal limits Gait & Station: normal Patient leans: N/A   Psychiatric Specialty Exam:   Presentation  General Appearance:  Casual; Neat   Eye Contact: Fair   Speech: Spontaneous   Speech Volume: Normal   Handedness: Right     Mood and Affect  Mood: "Fine"   Affect: Stable and pleasant    Thought Processes: Improved, at baseline   Descriptions of Associations: Intact   Orientation: Well-oriented   Thought Content:  Improved, at baseline  Hallucinations:Denies, occasionally heard talking to self in her room  Ideas of Reference:None noted   Suicidal Thoughts:Denies SI, no intention or plan  Homicidal Thoughts:Denies HI, no intention or plan   Sensorium  Memory: Immediate Fair; Remote Poor   Judgment: Fair, pt is Rx compliant   Insight: Improved     Executive Functions  Concentration: Improved   Attention Span: Improved   Language: Fair     Psychomotor Activity  Psychomotor Activity: Normal  Assets  Assets: Communication Skills     Sleep  Sleep:Improved     Physical Exam: Physical Exam Vitals and nursing note reviewed.  Constitutional:      Appearance: Normal appearance. She is normal weight.  Neurological:     General: No focal deficit present.     Mental Status: She is alert.      Review of Systems  Constitutional: Negative.   HENT: Negative.    Eyes: Negative.   Respiratory: Negative.    Cardiovascular: Negative.   Gastrointestinal: Negative.   Genitourinary: Negative.   Musculoskeletal: Negative.   Skin: Negative.   Neurological: Negative.   Endo/Heme/Allergies: Negative.     Blood pressure 111/60, pulse 75, temperature 98.6 F (37 C), resp. rate 18, height 5\' 6"  (1.676 m), weight (P) 82.8 kg, SpO2 95%. Body mass index is 29.46 kg/m (pended).   Treatment Plan Summary: Daily contact with patient to assess and evaluate symptoms and progress in treatment, Medication management, and Plan continue current medications.  Continue to monitor patient on as needed meds   Patient received  dose of Invega Sustenna 156 mg IM on  01/15/23 Oxybutynin 5 mg by mouth twice daily to help with urinary incontinence UA, CBC, and CMP ordered  Patient is awaiting placement    [name], MD

## 2023-01-25 NOTE — Plan of Care (Signed)
D: Pt alert and oriented. Pt denies experiencing any anxiety/depression. Pt denies experiencing any pain at this time. Pt denies experiencing any SI/HI, or AVH at this time however can be observed responding to internal stimuli.   A: Scheduled medications administered to pt, per MD orders. Support and encouragement provided. Frequent verbal contact made. Routine safety checks conducted q15 minutes.   R: No adverse drug reactions noted. Pt verbally contracts for safety at this time. Pt compliant with medications and treatment plan. Pt interacts well however minimally with others on the unit. Pt remains safe at this time. Plan of care ongoing.   Problem: Coping: Goal: Level of anxiety will decrease Outcome: Progressing   Problem: Pain Managment: Goal: General experience of comfort will improve Outcome: Progressing

## 2023-01-25 NOTE — Group Note (Signed)
Date:  01/25/2023 Time:  8:31 PM  Group Topic/Focus:  Making Healthy Choices:   The focus of this group is to help patients identify negative/unhealthy choices they were using prior to admission and identify positive/healthier coping strategies to replace them upon discharge.    Participation Level:  Active  Participation Quality:  Appropriate  Affect:  Appropriate  Cognitive:  Appropriate  Insight: Good  Engagement in Group:  Engaged  Modes of Intervention:  Discussion  Additional Comments:    [name] 01/25/2023, 8:31 PM

## 2023-01-25 NOTE — Group Note (Signed)
Date:  01/25/2023 Time:  3:30 PM  Group Topic/Focus:  Outside Recreation    Participation Level:  Active  Participation Quality:  Appropriate  Affect:  Appropriate  Cognitive:  Appropriate  Insight: Appropriate  Engagement in Group:  Engaged  Modes of Intervention:  Activity  Additional Comments:  none  Rodena Goldmann 01/25/2023, 3:30 PM

## 2023-01-26 DIAGNOSIS — F250 Schizoaffective disorder, bipolar type: Secondary | ICD-10-CM | POA: Diagnosis not present

## 2023-01-26 LAB — COMPREHENSIVE METABOLIC PANEL
ALT: 21 U/L (ref 0–44)
AST: 18 U/L (ref 15–41)
Albumin: 3.6 g/dL (ref 3.5–5.0)
Alkaline Phosphatase: 73 U/L (ref 38–126)
Anion gap: 9 (ref 5–15)
BUN: 18 mg/dL (ref 8–23)
CO2: 25 mmol/L (ref 22–32)
Calcium: 9.4 mg/dL (ref 8.9–10.3)
Chloride: 100 mmol/L (ref 98–111)
Creatinine, Ser: 0.73 mg/dL (ref 0.44–1.00)
GFR, Estimated: >60 mL/min (ref >60)
Glucose, Bld: 363 mg/dL — ABNORMAL HIGH (ref 70–99)
Potassium: 4.2 mmol/L (ref 3.5–5.1)
Sodium: 134 mmol/L — ABNORMAL LOW (ref 135–145)
Total Bilirubin: 0.6 mg/dL (ref 0.3–1.2)
Total Protein: 6.7 g/dL (ref 6.5–8.1)

## 2023-01-26 LAB — CBC WITH DIFFERENTIAL/PLATELET
Abs Immature Granulocytes: 0.02 10*3/uL (ref 0.00–0.07)
Basophils Absolute: 0 10*3/uL (ref 0.0–0.1)
Basophils Relative: 1 %
Eosinophils Absolute: 0.2 10*3/uL (ref 0.0–0.5)
Eosinophils Relative: 4 %
HCT: 36 % (ref 36.0–46.0)
Hemoglobin: 12.2 g/dL (ref 12.0–15.0)
Immature Granulocytes: 0 %
Lymphocytes Relative: 33 %
Lymphs Abs: 1.7 10*3/uL (ref 0.7–4.0)
MCH: 29.5 pg (ref 26.0–34.0)
MCHC: 33.9 g/dL (ref 30.0–36.0)
MCV: 87 fL (ref 80.0–100.0)
Monocytes Absolute: 0.4 10*3/uL (ref 0.1–1.0)
Monocytes Relative: 8 %
Neutro Abs: 2.8 10*3/uL (ref 1.7–7.7)
Neutrophils Relative %: 54 %
Platelets: 271 10*3/uL (ref 150–400)
RBC: 4.14 MIL/uL (ref 3.87–5.11)
RDW: 12.8 % (ref 11.5–15.5)
WBC: 5.2 10*3/uL (ref 4.0–10.5)
nRBC: 0 % (ref 0.0–0.2)

## 2023-01-26 NOTE — Group Note (Signed)
Date:  01/26/2023 Time:  10:51 PM  Group Topic/Focus:  Making Healthy Choices:   The focus of this group is to help patients identify negative/unhealthy choices they were using prior to admission and identify positive/healthier coping strategies to replace them upon discharge.    Participation Level:  Did Not Attend  Participation Quality:      Affect:      Cognitive:      Insight: None  Engagement in Group:  None  Modes of Intervention:      Additional Comments:    [name] 01/26/2023, 10:51 PM

## 2023-01-26 NOTE — Progress Notes (Signed)
  Pt is alert and oriented X4. Affect is bright and congruent..Pt  denies pain as well as  SI/HI/AVH at this time. Pt stayed in her room tonight and did not attend group.   Scheduled medications administration to patient per MD orders. Support and encouragement provided . A routine safety checks conducted every 15 minutes without incident. Patient informed to notify staff with problems or concerns and verbalize understanding.   No adverse drug noted. Pt is compliant with medications and treatment plan . Pt is receptive calm, cooperative and interacts well with others on the unit . Pt contracts for safety and remains safe on the unit at this time.

## 2023-01-26 NOTE — Progress Notes (Signed)
Patient denies SI, HI, and AVH. She is calm and cooperative with assessment. She comes out to the dayroom for meals but stays isolated to her room outside of meal time. She is compliant with scheduled medication.

## 2023-01-26 NOTE — Group Note (Signed)
Date:  01/26/2023 Time:  5:32 PM  Group Topic/Focus:  Outside Recreation    Participation Level:  Active  Participation Quality:  Appropriate  Affect:  Appropriate  Cognitive:  Appropriate  Insight: Appropriate  Engagement in Group:  Engaged  Modes of Intervention:  Activity  Additional Comments:  none  Rodena Goldmann 01/26/2023, 5:32 PM

## 2023-01-26 NOTE — Progress Notes (Signed)
   01/26/23 2100  Psych Admission Type (Psych Patients Only)  Admission Status Voluntary  Psychosocial Assessment  Patient Complaints None  Eye Contact Brief  Facial Expression Flat  Affect Flat  Speech Soft  Interaction Minimal  Motor Activity Slow;Shuffling  Appearance/Hygiene Unremarkable  Behavior Characteristics Cooperative  Mood Depressed  Aggressive Behavior  Effect No apparent injury  Thought Process  Coherency WDL  Content WDL  Delusions None reported or observed  Perception Hallucinations  Hallucination Auditory  Judgment Impaired  Confusion None  Danger to Self  Current suicidal ideation? Denies  Agreement Not to Harm Self Yes  Description of Agreement verbal  Danger to Others  Danger to Others None reported or observed  Danger to Others Abnormal  Harmful Behavior to others No threats or harm toward other people

## 2023-01-26 NOTE — Plan of Care (Signed)
  Problem: Education: Goal: Knowledge of General Education information will improve Description: Including pain rating scale, medication(s)/side effects and non-pharmacologic comfort measures Outcome: Progressing   Problem: Health Behavior/Discharge Planning: Goal: Ability to manage health-related needs will improve Outcome: Progressing   Problem: Clinical Measurements: Goal: Ability to maintain clinical measurements within normal limits will improve Outcome: Progressing Goal: Diagnostic test results will improve Outcome: Progressing   Problem: Nutrition: Goal: Adequate nutrition will be maintained Outcome: Progressing   Problem: Coping: Goal: Level of anxiety will decrease Outcome: Progressing   Problem: Elimination: Goal: Will not experience complications related to bowel motility Outcome: Progressing Goal: Will not experience complications related to urinary retention Outcome: Progressing   Problem: Pain Managment: Goal: General experience of comfort will improve Outcome: Progressing   Problem: Safety: Goal: Ability to remain free from injury will improve Outcome: Progressing   Problem: Skin Integrity: Goal: Risk for impaired skin integrity will decrease Outcome: Progressing

## 2023-01-26 NOTE — Progress Notes (Signed)
[facility] MD Progress Note  Betty Henderson  MRN:  [id number]  Subjective: Case discussed in multidisciplinary meeting today, chart reviewed, patient seen today during rounds.  Social worker shared that patient had psychological testing done, according to report patient has capacity to take care of self.  Social worker is working with DSS on placement.  Patient reports she is doing fine today.  She was observed sitting in day area interacting with peers.  No new acute issues overnight reported by the staff.  Patient labs reviewed.  Patient's fasting glucose is in 300s.  Will get hemoglobin A1c and lipid profile tomorrow morning.  Patient's urine analysis indicates glucose of more than 500.  Reportedly patient has gained weight in past 3 months.   Patient denies any acute events overnight.  Patient has been attending groups and participating in milieu.  She is interacting well with her peers.  Patient denies auditory or visual hallucinations.   Principal Problem: Schizoaffective disorder, bipolar type (HCC) Diagnosis: Principal Problem:   Schizoaffective disorder, bipolar type (HCC)   Past Psychiatric History: Schizoaffective disorder, bipolar type.  Past Medical History:  Past Medical History:  Diagnosis Date   Anemia    Arthritis    Chronic pain    Drug-seeking behavior    Malingering    Osteopetrosis    Psychosis (HCC)    Schizoaffective disorder, bipolar type (HCC)     Past Surgical History:  Procedure Laterality Date   MOUTH SURGERY     TUBAL LIGATION     Family History:  Family History  Family history unknown: Yes   Family Psychiatric  History: Unremarkable Social History:  Social History   Substance and Sexual Activity  Alcohol Use Not Currently   Comment: 1 cocktail 3 weeks ago     Social History   Substance and Sexual Activity  Drug Use Not Currently   Types: Cocaine   Comment: states "it's legal though"    Social History   Socioeconomic History   Marital  status: Single    Spouse name: Not on file   Number of children: Not on file   Years of education: Not on file   Highest education level: Not on file  Occupational History   Not on file  Tobacco Use   Smoking status: Every Day    Current packs/day: 0.50    Types: Cigarettes   Smokeless tobacco: Never  Vaping Use   Vaping status: Never Used  Substance and Sexual Activity   Alcohol use: Not Currently    Comment: 1 cocktail 3 weeks ago   Drug use: Not Currently    Types: Cocaine    Comment: states "it's legal though"   Sexual activity: Never  Other Topics Concern   Not on file  Social History Narrative   Not on file   Social Determinants of Health   Financial Resource Strain: Not on file  Food Insecurity: Food Insecurity Present (08/22/2022)   Hunger Vital Sign    Worried About Running Out of Food in the Last Year: Sometimes true    Ran Out of Food in the Last Year: Sometimes true  Transportation Needs: Patient Unable To Answer (08/22/2022)   PRAPARE - Transportation    Lack of Transportation (Medical): Patient unable to answer    Lack of Transportation (Non-Medical): Patient unable to answer  Recent Concern: Transportation Needs - Unmet Transportation Needs (06/13/2022)   [facility] from [facility], [facility]   [facility] - Transportation    Lack of Transportation (  Medical): Not on file    Lack of Transportation (Non-Medical): Yes  Physical Activity: Not on file  Stress: Not on file  Social Connections: Unknown (08/06/2021)   Received from [facility], [facility]   Social Network    Social Network: Not on file   Additional Social History:     Patient is homeless at this time.  Social worker is working with DSS on [employer] application and  placement.                    Sleep Good  Appetite:  Good  Current Medications: Current Facility-Administered Medications  Medication Dose Route Frequency Provider Last Rate Last Admin   acetaminophen (TYLENOL)  tablet 650 mg  650 mg Oral Q6H PRN [name], DO   650 mg at 01/24/23 2139   alum & mag hydroxide-simeth (MAALOX/MYLANTA) 200-200-20 MG/5ML suspension 30 mL  30 mL Oral Q4H PRN [name], DO   30 mL at 09/20/22 1142   diphenhydrAMINE (BENADRYL) capsule 50 mg  50 mg Oral Q6H PRN [name], DO   50 mg at 09/29/22 2254   Or   diphenhydrAMINE (BENADRYL) injection 50 mg  50 mg Intramuscular Q6H PRN [name], DO       haloperidol (HALDOL) tablet 5 mg  5 mg Oral Q6H PRN [name], DO   5 mg at 09/28/22 2149   Or   haloperidol lactate (HALDOL) injection 5 mg  5 mg Intramuscular Q6H PRN [name], DO       ibuprofen (ADVIL) tablet 600 mg  600 mg Oral Q6H PRN [name], [name], MD   600 mg at 01/18/23 4782   LORazepam (ATIVAN) tablet 1 mg  1 mg Oral TID PRN [name], DO   1 mg at 01/23/23 2133   magnesium hydroxide (MILK OF MAGNESIA) suspension 30 mL  30 mL Oral Daily PRN [name], DO   30 mL at 11/08/22 1012   oxybutynin (DITROPAN) tablet 5 mg  5 mg Oral BID [name], MD   5 mg at 01/26/23 0836   paliperidone (INVEGA SUSTENNA) injection 156 mg  156 mg Intramuscular Q28 days [name], MD   156 mg at 01/15/23 1501   traZODone (DESYREL) tablet 50 mg  50 mg Oral QHS PRN [name], DO   50 mg at 01/25/23 2112    Lab Results:  Results for orders placed or performed during the hospital encounter of 08/22/22 (from the past 48 hour(s))  Urinalysis, Routine w reflex microscopic -Urine, Clean Catch     Status: Abnormal   Collection Time: 01/25/23  4:55 PM  Result Value Ref Range   Color, Urine STRAW (A) YELLOW   APPearance HAZY (A) CLEAR   Specific Gravity, Urine 1.025 1.005 - 1.030   pH 5.0 5.0 - 8.0   Glucose, UA >=500 (A) NEGATIVE mg/dL   Hgb urine dipstick NEGATIVE NEGATIVE   Bilirubin Urine NEGATIVE NEGATIVE   Ketones, ur NEGATIVE NEGATIVE mg/dL   Protein, ur  NEGATIVE NEGATIVE mg/dL   Nitrite NEGATIVE NEGATIVE   Leukocytes,Ua NEGATIVE NEGATIVE   RBC / HPF 0-5 0 - 5 RBC/hpf   WBC, UA 0-5 0 - 5 WBC/hpf   Bacteria, UA NONE SEEN NONE SEEN   Squamous Epithelial / HPF 0-5 0 - 5 /HPF    Comment: Performed at [facility], [address], [place], [place] [address]  CBC with Differential/Platelet  Status: None   Collection Time: 01/26/23  6:00 AM  Result Value Ref Range   WBC 5.2 4.0 - 10.5 K/uL   RBC 4.14 3.87 - 5.11 MIL/uL   Hemoglobin 12.2 12.0 - 15.0 g/dL   HCT 47.4 25.9 - 56.3 %   MCV 87.0 80.0 - 100.0 fL   MCH 29.5 26.0 - 34.0 pg   MCHC 33.9 30.0 - 36.0 g/dL   RDW 87.5 64.3 - 32.9 %   Platelets 271 150 - 400 K/uL   nRBC 0.0 0.0 - 0.2 %   Neutrophils Relative % 54 %   Neutro Abs 2.8 1.7 - 7.7 K/uL   Lymphocytes Relative 33 %   Lymphs Abs 1.7 0.7 - 4.0 K/uL   Monocytes Relative 8 %   Monocytes Absolute 0.4 0.1 - 1.0 K/uL   Eosinophils Relative 4 %   Eosinophils Absolute 0.2 0.0 - 0.5 K/uL   Basophils Relative 1 %   Basophils Absolute 0.0 0.0 - 0.1 K/uL   Immature Granulocytes 0 %   Abs Immature Granulocytes 0.02 0.00 - 0.07 K/uL    Comment: Performed at [facility], [address] [place], [place] [address]  Comprehensive metabolic panel     Status: Abnormal   Collection Time: 01/26/23  6:00 AM  Result Value Ref Range   Sodium 134 (L) 135 - 145 mmol/L   Potassium 4.2 3.5 - 5.1 mmol/L   Chloride 100 98 - 111 mmol/L   CO2 25 22 - 32 mmol/L   Glucose, Bld 363 (H) 70 - 99 mg/dL    Comment: Glucose reference range applies only to samples taken after fasting for at least 8 hours.   BUN 18 8 - 23 mg/dL   Creatinine, Ser 1.66 0.44 - 1.00 mg/dL   Calcium 9.4 8.9 - 06.3 mg/dL   Total Protein 6.7 6.5 - 8.1 g/dL   Albumin 3.6 3.5 - 5.0 g/dL   AST 18 15 - 41 U/L   ALT 21 0 - 44 U/L   Alkaline Phosphatase 73 38 - 126 U/L   Total Bilirubin 0.6 0.3 - 1.2 mg/dL   GFR, Estimated >01 >60 mL/min    Comment:  (NOTE) Calculated using the CKD-EPI Creatinine Equation (2021)    Anion gap 9 5 - 15    Comment: Performed at [facility], [address] [place], [place] [address]    Blood Alcohol level:  Lab Results  Component Value Date   [facility] <10 08/17/2022   ETH <10 01/11/2021      Musculoskeletal: Strength & Muscle Tone: within normal limits Gait & Station: normal Patient leans: N/A   Psychiatric Specialty Exam:   Presentation  General Appearance:  Casual; Neat   Eye Contact: Fair   Speech: Spontaneous   Speech Volume: Normal   Handedness: Right     Mood and Affect  Mood: "Fine"   Affect: Stable and pleasant    Thought Processes: Improved, at baseline   Descriptions of Associations: Intact   Orientation: Well-oriented   Thought Content: Improved, at baseline  Hallucinations:Denies, occasionally heard talking to self in her room  Ideas of Reference:None noted   Suicidal Thoughts:Denies SI, no intention or plan  Homicidal Thoughts:Denies HI, no intention or plan   Sensorium  Memory: Immediate Fair; Remote Poor   Judgment: Fair, pt is Rx compliant   Insight: Improved     Executive Functions  Concentration: Improved   Attention Span: Improved   Language: Fair  Psychomotor Activity  Psychomotor Activity: Normal  Assets  Assets: Communication Skills     Sleep  Sleep:Improved     Physical Exam: Physical Exam Vitals and nursing note reviewed.  Constitutional:      Appearance: Normal appearance. She is normal weight.  Neurological:     General: No focal deficit present.     Mental Status: She is alert.      Review of Systems  Constitutional: Negative.   HENT: Negative.    Eyes: Negative.   Respiratory: Negative.    Cardiovascular: Negative.   Gastrointestinal: Negative.   Genitourinary: Negative.   Musculoskeletal: Negative.   Skin: Negative.   Neurological: Negative.   Endo/Heme/Allergies: Negative.      Blood pressure 112/67, pulse 71, temperature 97.7 F (36.5 C), resp. rate 18, height 5\' 6"  (1.676 m), weight (P) 82.8 kg, SpO2 94%. Body mass index is 29.46 kg/m (pended).   Treatment Plan Summary: Daily contact with patient to assess and evaluate symptoms and progress in treatment, Medication management, and Plan continue current medications.  Continue to monitor patient on as needed meds   Patient received  dose of Invega Sustenna 156 mg IM on  01/15/23 Oxybutynin 5 mg by mouth twice daily to help with urinary incontinence   Patient is awaiting placement    [name], MD

## 2023-01-27 ENCOUNTER — Other Ambulatory Visit (HOSPITAL_COMMUNITY): Payer: Self-pay

## 2023-01-27 DIAGNOSIS — F250 Schizoaffective disorder, bipolar type: Secondary | ICD-10-CM | POA: Diagnosis not present

## 2023-01-27 LAB — TSH: TSH: 1.773 u[IU]/mL (ref 0.350–4.500)

## 2023-01-27 LAB — LIPID PANEL
Cholesterol: 259 mg/dL — ABNORMAL HIGH (ref 0–200)
HDL: 65 mg/dL (ref >40)
LDL Cholesterol: 163 mg/dL — ABNORMAL HIGH (ref 0–99)
Total CHOL/HDL Ratio: 4 {ratio}
Triglycerides: 155 mg/dL — ABNORMAL HIGH (ref <150)
VLDL: 31 mg/dL (ref 0–40)

## 2023-01-27 LAB — HEMOGLOBIN A1C
Hgb A1c MFr Bld: 11 % — ABNORMAL HIGH (ref 4.8–5.6)
Mean Plasma Glucose: 269 mg/dL

## 2023-01-27 MED ORDER — METFORMIN HCL 500 MG PO TABS
500.0000 mg | ORAL_TABLET | Freq: Every day | ORAL | Status: DC
  Administered 2023-01-27 – 2023-02-09 (×14): 500 mg via ORAL
  Filled 2023-01-27 (×15): qty 1

## 2023-01-27 MED ORDER — ATORVASTATIN CALCIUM 10 MG PO TABS
20.0000 mg | ORAL_TABLET | Freq: Every day | ORAL | Status: DC
  Administered 2023-01-27 – 2023-03-28 (×61): 20 mg via ORAL
  Filled 2023-01-27 (×61): qty 2

## 2023-01-27 MED ORDER — ASPIRIN 81 MG PO TBEC
81.0000 mg | DELAYED_RELEASE_TABLET | Freq: Every day | ORAL | Status: DC
  Administered 2023-01-27 – 2023-03-28 (×61): 81 mg via ORAL
  Filled 2023-01-27 (×62): qty 1

## 2023-01-27 MED ORDER — GLIMEPIRIDE 4 MG PO TABS
4.0000 mg | ORAL_TABLET | Freq: Every day | ORAL | Status: DC
  Administered 2023-01-27 – 2023-02-14 (×19): 4 mg via ORAL
  Filled 2023-01-27 (×20): qty 1

## 2023-01-27 NOTE — BHH Counselor (Signed)
CSW contacted Sharlee Blew, [phone] at DSS to see if medical records were received as CSW reached out to leadership regarding this.   Camillie reports she has not received the records yet.   CSW will follow-up again with supervisors.    Reynaldo Minium, MSW, [place] 01/27/2023 3:47 PM

## 2023-01-27 NOTE — Progress Notes (Signed)
[facility] MD Progress Note  Naria Abbey  MRN:  [id number]  Subjective: Case discussed in multidisciplinary meeting today, chart reviewed, patient seen today during rounds.  Social worker shared that patient had psychological testing done, according to report patient has capacity to take care of self.  Social worker is working with DSS on placement.  Patient reports she is doing fine today.  She was observed sitting in day area interacting with peers.  No new acute issues overnight reported by the staff. Patient denies any acute events overnight.  Patient has been attending groups and participating in milieu.  She is interacting well with her peers.  Patient denies auditory or visual hallucinations.  Hospitalist was consulted due to high fasting blood glucose level.  Patient has been started on Metformin and Amaryl.  Patient started on atorvastatin for Mixed hyperlipidemia    Principal Problem: Schizoaffective disorder, bipolar type (HCC) Diagnosis: Principal Problem:   Schizoaffective disorder, bipolar type (HCC)   Past Psychiatric History: Schizoaffective disorder, bipolar type.  Past Medical History:  Past Medical History:  Diagnosis Date   Anemia    Arthritis    Chronic pain    Drug-seeking behavior    Malingering    Osteopetrosis    Psychosis (HCC)    Schizoaffective disorder, bipolar type (HCC)     Past Surgical History:  Procedure Laterality Date   MOUTH SURGERY     TUBAL LIGATION     Family History:  Family History  Family history unknown: Yes   Family Psychiatric  History: Unremarkable Social History:  Social History   Substance and Sexual Activity  Alcohol Use Not Currently   Comment: 1 cocktail 3 weeks ago     Social History   Substance and Sexual Activity  Drug Use Not Currently   Types: Cocaine   Comment: states "it's legal though"    Social History   Socioeconomic History   Marital status: Single    Spouse name: Not on file   Number of  children: Not on file   Years of education: Not on file   Highest education level: Not on file  Occupational History   Not on file  Tobacco Use   Smoking status: Every Day    Current packs/day: 0.50    Types: Cigarettes   Smokeless tobacco: Never  Vaping Use   Vaping status: Never Used  Substance and Sexual Activity   Alcohol use: Not Currently    Comment: 1 cocktail 3 weeks ago   Drug use: Not Currently    Types: Cocaine    Comment: states "it's legal though"   Sexual activity: Never  Other Topics Concern   Not on file  Social History Narrative   Not on file   Social Determinants of Health   Financial Resource Strain: Not on file  Food Insecurity: Food Insecurity Present (08/22/2022)   Hunger Vital Sign    Worried About Running Out of Food in the Last Year: Sometimes true    Ran Out of Food in the Last Year: Sometimes true  Transportation Needs: Patient Unable To Answer (08/22/2022)   PRAPARE - Transportation    Lack of Transportation (Medical): Patient unable to answer    Lack of Transportation (Non-Medical): Patient unable to answer  Recent Concern: Transportation Needs - Unmet Transportation Needs (06/13/2022)   Received from [facility], [facility]   [facility] - Transportation    Lack of Transportation (Medical): Not on file    Lack of Transportation (Non-Medical): Yes  Physical  Activity: Not on file  Stress: Not on file  Social Connections: Unknown (08/06/2021)   Received from [facility], [facility]   Social Network    Social Network: Not on file   Additional Social History:     Patient is homeless at this time.  Social worker is working with DSS on [employer] application and  placement.                    Sleep Good  Appetite:  Good  Current Medications: Current Facility-Administered Medications  Medication Dose Route Frequency Provider Last Rate Last Admin   acetaminophen (TYLENOL) tablet 650 mg  650 mg Oral Q6H PRN [name], DO   650 mg at 01/24/23 2139   alum & mag hydroxide-simeth (MAALOX/MYLANTA) 200-200-20 MG/5ML suspension 30 mL  30 mL Oral Q4H PRN [name], DO   30 mL at 09/20/22 1142   aspirin EC tablet 81 mg  81 mg Oral Daily [name], MD   81 mg at 01/27/23 1631   atorvastatin (LIPITOR) tablet 20 mg  20 mg Oral Daily [name], MD   20 mg at 01/27/23 1347   diphenhydrAMINE (BENADRYL) capsule 50 mg  50 mg Oral Q6H PRN [name], DO   50 mg at 09/29/22 2254   Or   diphenhydrAMINE (BENADRYL) injection 50 mg  50 mg Intramuscular Q6H PRN [name], DO       glimepiride (AMARYL) tablet 4 mg  4 mg Oral Q breakfast [name], MD   4 mg at 01/27/23 1347   haloperidol (HALDOL) tablet 5 mg  5 mg Oral Q6H PRN [name], DO   5 mg at 09/28/22 2149   Or   haloperidol lactate (HALDOL) injection 5 mg  5 mg Intramuscular Q6H PRN [name], DO       ibuprofen (ADVIL) tablet 600 mg  600 mg Oral Q6H PRN [name], [name], MD   600 mg at 01/18/23 8295   LORazepam (ATIVAN) tablet 1 mg  1 mg Oral TID PRN [name], DO   1 mg at 01/23/23 2133   magnesium hydroxide (MILK OF MAGNESIA) suspension 30 mL  30 mL Oral Daily PRN [name], DO   30 mL at 11/08/22 1012   metFORMIN (GLUCOPHAGE) tablet 500 mg  500 mg Oral Q breakfast [name], MD   500 mg at 01/27/23 1347   oxybutynin (DITROPAN) tablet 5 mg  5 mg Oral BID [name], MD   5 mg at 01/27/23 0925   paliperidone (INVEGA SUSTENNA) injection 156 mg  156 mg Intramuscular Q28 days [name], MD   156 mg at 01/15/23 1501   traZODone (DESYREL) tablet 50 mg  50 mg Oral QHS PRN [name], DO   50 mg at 01/26/23 2057    Lab Results:  Results for orders placed or performed during the hospital encounter of 08/22/22 (from the past 48 hour(s))  CBC with Differential/Platelet     Status: None   Collection Time: 01/26/23  6:00 AM  Result  Value Ref Range   WBC 5.2 4.0 - 10.5 K/uL   RBC 4.14 3.87 - 5.11 MIL/uL   Hemoglobin 12.2 12.0 - 15.0 g/dL   HCT 62.1 30.8 - 65.7 %   MCV 87.0 80.0 - 100.0 fL   MCH 29.5 26.0 - 34.0 pg   MCHC 33.9 30.0 - 36.0 g/dL   RDW 12.8  11.5 - 15.5 %   Platelets 271 150 - 400 K/uL   nRBC 0.0 0.0 - 0.2 %   Neutrophils Relative % 54 %   Neutro Abs 2.8 1.7 - 7.7 K/uL   Lymphocytes Relative 33 %   Lymphs Abs 1.7 0.7 - 4.0 K/uL   Monocytes Relative 8 %   Monocytes Absolute 0.4 0.1 - 1.0 K/uL   Eosinophils Relative 4 %   Eosinophils Absolute 0.2 0.0 - 0.5 K/uL   Basophils Relative 1 %   Basophils Absolute 0.0 0.0 - 0.1 K/uL   Immature Granulocytes 0 %   Abs Immature Granulocytes 0.02 0.00 - 0.07 K/uL    Comment: Performed at [facility], [address] [place], [place] [address]  Comprehensive metabolic panel     Status: Abnormal   Collection Time: 01/26/23  6:00 AM  Result Value Ref Range   Sodium 134 (L) 135 - 145 mmol/L   Potassium 4.2 3.5 - 5.1 mmol/L   Chloride 100 98 - 111 mmol/L   CO2 25 22 - 32 mmol/L   Glucose, Bld 363 (H) 70 - 99 mg/dL    Comment: Glucose reference range applies only to samples taken after fasting for at least 8 hours.   BUN 18 8 - 23 mg/dL   Creatinine, Ser 2.95 0.44 - 1.00 mg/dL   Calcium 9.4 8.9 - 62.1 mg/dL   Total Protein 6.7 6.5 - 8.1 g/dL   Albumin 3.6 3.5 - 5.0 g/dL   AST 18 15 - 41 U/L   ALT 21 0 - 44 U/L   Alkaline Phosphatase 73 38 - 126 U/L   Total Bilirubin 0.6 0.3 - 1.2 mg/dL   GFR, Estimated >30 >86 mL/min    Comment: (NOTE) Calculated using the CKD-EPI Creatinine Equation (2021)    Anion gap 9 5 - 15    Comment: Performed at [facility], [address] [place], [place] [address]  Lipid panel     Status: Abnormal   Collection Time: 01/27/23  6:08 AM  Result Value Ref Range   Cholesterol 259 (H) 0 - 200 mg/dL   Triglycerides 962 (H) <150 mg/dL   HDL 65 >95 mg/dL   Total CHOL/HDL Ratio 4.0 RATIO   VLDL 31 0 - 40  mg/dL   LDL Cholesterol 284 (H) 0 - 99 mg/dL    Comment:        Total Cholesterol/HDL:CHD Risk Coronary Heart Disease Risk Table                     Men   Women  1/2 Average Risk   3.4   3.3  Average Risk       5.0   4.4  2 X Average Risk   9.6   7.1  3 X Average Risk  23.4   11.0        Use the calculated Patient Ratio above and the CHD Risk Table to determine the patient's CHD Risk.        ATP III CLASSIFICATION (LDL):  <100     mg/dL   Optimal  132-440  mg/dL   Near or Above                    Optimal  130-159  mg/dL   Borderline  102-725  mg/dL   High  >366     mg/dL   Very High Performed at [facility], [address], [place], [place]  [address]   TSH     Status: None   Collection Time: 01/27/23  6:08 AM  Result Value Ref Range   TSH 1.773 0.350 - 4.500 uIU/mL    Comment: Performed by a 3rd Generation assay with a functional sensitivity of <=0.01 uIU/mL. Performed at [facility], [address] [place], [place] [address]     Blood Alcohol level:  Lab Results  Component Value Date   [facility] <10 08/17/2022   ETH <10 01/11/2021      Musculoskeletal: Strength & Muscle Tone: within normal limits Gait & Station: normal Patient leans: N/A   Psychiatric Specialty Exam:   Presentation  General Appearance:  Casual; Neat   Eye Contact: Fair   Speech: Spontaneous   Speech Volume: Normal   Handedness: Right     Mood and Affect  Mood: "Fine"   Affect: Stable and pleasant    Thought Processes: Improved, at baseline   Descriptions of Associations: Intact   Orientation: Well-oriented   Thought Content: Improved, at baseline  Hallucinations:Denies, occasionally heard talking to self in her room  Ideas of Reference:None noted   Suicidal Thoughts:Denies SI, no intention or plan  Homicidal Thoughts:Denies HI, no intention or plan   Sensorium  Memory: Immediate Fair; Remote Poor   Judgment: Fair, pt is Rx compliant    Insight: Improved     Executive Functions  Concentration: Improved   Attention Span: Improved   Language: Fair     Psychomotor Activity  Psychomotor Activity: Normal  Assets  Assets: Communication Skills     Sleep  Sleep:Improved     Physical Exam: Physical Exam Vitals and nursing note reviewed.  Constitutional:      Appearance: Normal appearance. She is normal weight.  Neurological:     General: No focal deficit present.     Mental Status: She is alert.      Review of Systems  Constitutional: Negative.   HENT: Negative.    Eyes: Negative.   Respiratory: Negative.    Cardiovascular: Negative.   Gastrointestinal: Negative.   Genitourinary: Negative.   Musculoskeletal: Negative.   Skin: Negative.   Neurological: Negative.   Endo/Heme/Allergies: Negative.     Blood pressure 126/73, pulse 88, temperature 97.9 F (36.6 C), resp. rate 18, height 5\' 6"  (1.676 m), weight (P) 82.8 kg, SpO2 97%. Body mass index is 29.46 kg/m (pended).   Treatment Plan Summary: Daily contact with patient to assess and evaluate symptoms and progress in treatment, Medication management, and Plan continue current medications.   Continue to monitor patient on as needed meds   Patient received  dose of Invega Sustenna 156 mg IM on  01/15/23 Oxybutynin 5 mg by mouth twice daily to help with urinary incontinence Hospitalist was consulted due to high fasting blood glucose level.  Patient has been started on Metformin and Amaryl.  Patient started on atorvastatin for Mixed hyperlipidemia Dr. Teola Bradley input and recommendation is appreciated  Patient is awaiting placement    [name], MD

## 2023-01-27 NOTE — Plan of Care (Signed)
D: Pt alert and oriented. Pt denies experiencing any anxiety/depression at this time. Pt denies experiencing any pain at this time. Pt denies experiencing any SI/HI, or AVH at this time however, can observed responding to internal stimuli in room at times.   A: Scheduled medications administered to pt, per MD orders. Support and encouragement provided. Frequent verbal contact made. Routine safety checks conducted q15 minutes.   R: No adverse drug reactions noted. Pt verbally contracts for safety at this time. Pt compliant with medications and treatment plan. Pt interacts well but minimally with others on the unit. Pt remains safe at this time. Plan of care ongoing.  Problem: Nutrition: Goal: Adequate nutrition will be maintained Outcome: Progressing   Problem: Coping: Goal: Level of anxiety will decrease Outcome: Progressing

## 2023-01-27 NOTE — Group Note (Signed)
Recreation Therapy Group Note   Group Topic:Coping Skills  Group Date: 01/27/2023 Start Time: 1400 End Time: 1455 Facilitators: Rosina Lowenstein, LRT, CTRS Location:  Dayroom  Group Description: Mind Map.  Patient was provided a blank template of a diagram with 32 blank boxes in a tiered system, branching from the center (similar to a bubble chart). LRT directed patients to label the middle of the diagram "Coping Skills". LRT and patients then came up with 8 different coping skills as examples. Pt were directed to record their coping skills in the 2nd tier boxes closest to the center.  Patients would then share their coping skills with the group as LRT wrote them out. LRT gave a handout of 99 different coping skills at the end of group.    Goal Area(s) Addressed: Patients will be able to define "coping skills". Patient will identify new coping skills.  Patient will increase communication.   Affect/Mood: Appropriate   Participation Level: Active and Engaged   Participation Quality: Independent   Behavior: Calm and Cooperative   Speech/Thought Process: Coherent   Insight: Good   Judgement: Good   Modes of Intervention: Worksheet   Patient Response to Interventions:  Attentive, Engaged, Interested , and Receptive   Education Outcome:  Acknowledges education   Clinical Observations/Individualized Feedback: Betty Henderson was active in their participation of session activities and group discussion. Pt identified "pray, dance, and sing" as coping skills. Pt interacted well with LRT and peers duration of session.    Plan: Continue to engage patient in RT group sessions 2-3x/week.   Rosina Lowenstein, LRT, CTRS 01/27/2023 3:00 PM

## 2023-01-27 NOTE — Plan of Care (Signed)
  Problem: Education: Goal: Knowledge of General Education information will improve Description: Including pain rating scale, medication(s)/side effects and non-pharmacologic comfort measures Outcome: Progressing   Problem: Health Behavior/Discharge Planning: Goal: Ability to manage health-related needs will improve Outcome: Progressing   Problem: Clinical Measurements: Goal: Ability to maintain clinical measurements within normal limits will improve Outcome: Progressing Goal: Diagnostic test results will improve Outcome: Progressing   Problem: Nutrition: Goal: Adequate nutrition will be maintained Outcome: Progressing   Problem: Coping: Goal: Level of anxiety will decrease Outcome: Progressing   Problem: Elimination: Goal: Will not experience complications related to bowel motility Outcome: Progressing Goal: Will not experience complications related to urinary retention Outcome: Progressing   Problem: Pain Managment: Goal: General experience of comfort will improve Outcome: Progressing   Problem: Safety: Goal: Ability to remain free from injury will improve Outcome: Progressing   Problem: Skin Integrity: Goal: Risk for impaired skin integrity will decrease Outcome: Progressing

## 2023-01-27 NOTE — BHH Counselor (Signed)
CSW reached out to medical records [phone] to see what the issue was with processing needed records for DSS.   CSW unable to reach, left HIPAA compliant VM requesting return call.   Reynaldo Minium, MSW, [place] 01/27/2023 3:48 PM

## 2023-01-27 NOTE — Consult Note (Signed)
Initial Consultation Note   Patient: Betty Henderson AVW:[id number] DOB: 11/05/1960 PCP: [facility] DOA: 08/22/2022 DOS: the patient was seen and examined on 01/27/2023 Primary service: [name]  Referring physician: Dr. Marval Regal Reason for consult: New onset of DM  Assessment/Plan:  New onset of DM -Type 2 diabetes secondary to excessive calorie intake in last 53-month.  And significant weight gaining, compatible with insulin resistance. -Start Amaryl and metformin, low doses on both medication can be titrated up according to response.  Check fingerstick every morning x3 days, if stable, can check glucose/fingerstick occasionally and repeat A1c in 3 months.  Given there is significant weight gaining in the last 4 months, will try to avoid insulin for now.  Also discussed with pharmacy regarding initiate GLP-1 agonist.  Will leave the workup including thyroid ultrasound to PCP as outpatient.  Metabolic syndrome Mixed hyperlipidemia -HDL 163 and borderline elevation of triglyceride -Significant central obesity, increased abdominal girth -Start atorvastatin -Repeat lipid panel in 2 to 3 months and consider to treat the hypertriglyceridemia if hypertriglyceridemia persists  Schizoaffective disorder and bipolar disorder -As per primary team     TRH will sign off at present, please call us again when needed.    HPI: Betty Henderson is a 63 y.o. female with past medical history of schizoaffective disorder, bipolar disorder, was at [facility] to titrate psychiatric problems.  As per patient, after she was admitted to psychiatry unit in May, she has had significant increase of appetite and craving for carbohydrates and so far gained about 20 kg in last 43-month.  She denied any urinary frequency or excessive thirsty.  Yesterday and BMP showed glucose 363.  Review of Systems: As mentioned in the history of present illness. All other systems reviewed  and are negative. Past Medical History:  Diagnosis Date   Anemia    Arthritis    Chronic pain    Drug-seeking behavior    Malingering    Osteopetrosis    Psychosis (HCC)    Schizoaffective disorder, bipolar type (HCC)    Past Surgical History:  Procedure Laterality Date   MOUTH SURGERY     TUBAL LIGATION     Social History:  reports that she has been smoking cigarettes. She has never used smokeless tobacco. She reports that she does not currently use alcohol. She reports that she does not currently use drugs after having used the following drugs: Cocaine.  Allergies  Allergen Reactions   Other Itching    PT reports having environmental allergies which she used to receive shots for, but cannot specify exact allergens   Lemon Oil Rash   Proanthocyanidin Rash   Strawberry Extract Rash   Adhesive  [Tape] Rash   Cherry Rash   Wound Dressing Adhesive Rash    Family History  Family history unknown: Yes    Prior to Admission medications   Medication Sig Start Date End Date Taking? Authorizing Provider  ondansetron (ZOFRAN) 4 MG tablet Take 1 tablet (4 mg total) by mouth every 8 (eight) hours as needed for nausea or vomiting. Patient not taking: Reported on 08/17/2022 06/07/22   Darrick Grinder, PA-C  ranitidine (ZANTAC) 150 MG capsule Take 1 capsule (150 mg total) by mouth daily. Patient not taking: Reported on 03/20/2018 05/31/17 01/07/19  Lorre Nick, MD    Physical Exam: Vitals:   01/25/23 1907 01/26/23 0714 01/26/23 1941 01/27/23 0721  BP: 111/62 112/67 (!) 107/49 (!) 118/58  Pulse: 82 71 65 74  Resp: 18   18  Temp: (!) 96.4 F (35.8 C) 97.7 F (36.5 C) 98.7 F (37.1 C) 98.2 F (36.8 C)  TempSrc:      SpO2: 97% 94% 99% 99%  Weight:      Height:       Eyes: PERRL, lids and conjunctivae normal ENMT: Mucous membranes are moist. Posterior pharynx clear of any exudate or lesions.Normal dentition.  Neck: normal, supple, no masses, no thyromegaly Respiratory: clear  to auscultation bilaterally, no wheezing, no crackles. Normal respiratory effort. No accessory muscle use.  Cardiovascular: Regular rate and rhythm, no murmurs / rubs / gallops. No extremity edema. 2+ pedal pulses. No carotid bruits.  Abdomen: no tenderness, no masses palpated. No hepatosplenomegaly. Bowel sounds positive.  Musculoskeletal: no clubbing / cyanosis. No joint deformity upper and lower extremities. Good ROM, no contractures. Normal muscle tone.  Skin: no rashes, lesions, ulcers. No induration Neurologic: CN 2-12 grossly intact. Sensation intact, DTR normal.  Muscle strength 5/5 on both sides Psychiatric: Normal judgment and insight. Alert and oriented x 3. Normal mood.    Data Reviewed:   BMP from yesterday reviewed.    Family Communication: None at bedside Primary team communication: Psy consult Thank you very much for involving [place] in the care of your patient.  Author: Emeline General, MD 01/27/2023 2:53 PM  For on call review [email or web address].

## 2023-01-28 DIAGNOSIS — F250 Schizoaffective disorder, bipolar type: Secondary | ICD-10-CM | POA: Diagnosis not present

## 2023-01-28 LAB — GLUCOSE, RANDOM: Glucose, Bld: 302 mg/dL — ABNORMAL HIGH (ref 70–99)

## 2023-01-28 MED ORDER — LIVING WELL WITH DIABETES BOOK
Freq: Once | Status: AC
  Filled 2023-01-28: qty 1

## 2023-01-28 NOTE — Plan of Care (Signed)
  Problem: Education: Goal: Knowledge of General Education information will improve Description: Including pain rating scale, medication(s)/side effects and non-pharmacologic comfort measures Outcome: Progressing   Problem: Health Behavior/Discharge Planning: Goal: Ability to manage health-related needs will improve Outcome: Progressing   Problem: Clinical Measurements: Goal: Ability to maintain clinical measurements within normal limits will improve Outcome: Progressing Goal: Diagnostic test results will improve Outcome: Progressing   Problem: Nutrition: Goal: Adequate nutrition will be maintained Outcome: Progressing   Problem: Coping: Goal: Level of anxiety will decrease Outcome: Progressing   Problem: Elimination: Goal: Will not experience complications related to bowel motility Outcome: Progressing Goal: Will not experience complications related to urinary retention Outcome: Progressing   Problem: Pain Managment: Goal: General experience of comfort will improve Outcome: Progressing   Problem: Safety: Goal: Ability to remain free from injury will improve Outcome: Progressing   Problem: Skin Integrity: Goal: Risk for impaired skin integrity will decrease Outcome: Progressing

## 2023-01-28 NOTE — Group Note (Signed)
Recreation Therapy Group Note   Group Topic:Communication  Group Date: 01/28/2023 Start Time: 1400 End Time: 1450 Facilitators: Rosina Lowenstein, LRT, CTRS Location: Courtyard  Group Description: Merchant navy officer. Patients drew a laminated card out of a bag that had a word or phrase on it. Pt encouraged to speak about a time in their life or fond memory that specifically relates to the word they chose out of the bag. An example would be: "parenthood, meals, siblings, travel, or home".  LRT prompted following questions and encouraged contribution from peers to increase communication.   Goal Area(s) Addressed: Patient will increase verbal communication by conversing with peers. Patient will contribute to group discussion with minimal prompting. Patient will reminisce a positive memory or moment in their life.   Affect/Mood: Appropriate   Participation Level: Active and Engaged   Participation Quality: Independent   Behavior: Calm and Cooperative   Speech/Thought Process: Coherent   Insight: Good   Judgement: Good   Modes of Intervention: Guided Discussion   Patient Response to Interventions:  Attentive, Engaged, Interested , and Receptive   Education Outcome:  Acknowledges education   Clinical Observations/Individualized Feedback: Rache was active in their participation of session activities and group discussion. Pt identified "My favorite plant is a venus fly trap that catches the bugs". Pt interacted well with LRT and peers duration of session.    Plan: Continue to engage patient in RT group sessions 2-3x/week.   Rosina Lowenstein, LRT, CTRS 01/28/2023 3:01 PM

## 2023-01-28 NOTE — Progress Notes (Signed)
   01/27/23 2300  Psych Admission Type (Psych Patients Only)  Admission Status Voluntary  Psychosocial Assessment  Patient Complaints None  Eye Contact Fair  Facial Expression Other (Comment) (Appropriate)  Affect Appropriate to circumstance  Speech Soft  Interaction Minimal  Motor Activity Slow;Shuffling  Appearance/Hygiene Unremarkable  Behavior Characteristics Cooperative;Unable to participate  Mood Depressed;Pleasant  Thought Process  Coherency WDL  Content WDL  Delusions None reported or observed  Perception Hallucinations  Hallucination Auditory  Judgment Impaired  Confusion None  Danger to Self  Current suicidal ideation? Denies  Agreement Not to Harm Self Yes  Description of Agreement Verbal  Danger to Others  Danger to Others None reported or observed  Danger to Others Abnormal  Harmful Behavior to others No threats or harm toward other people

## 2023-01-28 NOTE — Group Note (Signed)
Date:  01/28/2023 Time:  3:48 AM  Group Topic/Focus:  Making Healthy Choices:   The focus of this group is to help patients identify negative/unhealthy choices they were using prior to admission and identify positive/healthier coping strategies to replace them upon discharge.    Participation Level:  Active  Participation Quality:  Appropriate  Affect:  Appropriate  Cognitive:  Appropriate  Insight: Appropriate  Engagement in Group:  Engaged  Modes of Intervention:  Confrontation  Additional Comments:    [name] 01/28/2023, 3:48 AM

## 2023-01-28 NOTE — Group Note (Signed)
Date:  01/28/2023 Time:  11:53 AM  Group Topic/Focus:  Identifying Needs:   The focus of this group is to help patients identify their personal needs that have been historically problematic and identify healthy behaviors to address their needs. Personal Choices and Values:   The focus of this group is to help patients assess and explore the importance of values in their lives, how their values affect their decisions, how they express their values and what opposes their expression.    Participation Level:  Active  Participation Quality:  Appropriate  Affect:  Appropriate  Cognitive:  Alert  Insight: Appropriate  Engagement in Group:  Engaged  Modes of Intervention:  Discussion, Socialization, and Support  Additional Comments:    Cira Deyoe l Mery Guadalupe 01/28/2023, 11:53 AM

## 2023-01-28 NOTE — Group Note (Signed)

## 2023-01-28 NOTE — Progress Notes (Signed)
[facility] MD Progress Note  01/28/2023 1:32 PM Betty Henderson  MRN:  [id number] Subjective: Betty Henderson is seen on rounds.  There is still no resolution as to discharge planning.  She has been compliant with medications.  Recently started on metformin.  And Lipitor. Principal Problem: Schizoaffective disorder, bipolar type (HCC) Diagnosis: Principal Problem:   Schizoaffective disorder, bipolar type (HCC)  Total Time spent with patient: 15 minutes  Past Psychiatric History: Schizoaffective disorder  Past Medical History:  Past Medical History:  Diagnosis Date   Anemia    Arthritis    Chronic pain    Drug-seeking behavior    Malingering    Osteopetrosis    Psychosis (HCC)    Schizoaffective disorder, bipolar type (HCC)     Past Surgical History:  Procedure Laterality Date   MOUTH SURGERY     TUBAL LIGATION     Family History:  Family History  Family history unknown: Yes   Family Psychiatric  History: Unremarkable Social History:  Social History   Substance and Sexual Activity  Alcohol Use Not Currently   Comment: 1 cocktail 3 weeks ago     Social History   Substance and Sexual Activity  Drug Use Not Currently   Types: Cocaine   Comment: states "it's legal though"    Social History   Socioeconomic History   Marital status: Single    Spouse name: Not on file   Number of children: Not on file   Years of education: Not on file   Highest education level: Not on file  Occupational History   Not on file  Tobacco Use   Smoking status: Every Day    Current packs/day: 0.50    Types: Cigarettes   Smokeless tobacco: Never  Vaping Use   Vaping status: Never Used  Substance and Sexual Activity   Alcohol use: Not Currently    Comment: 1 cocktail 3 weeks ago   Drug use: Not Currently    Types: Cocaine    Comment: states "it's legal though"   Sexual activity: Never  Other Topics Concern   Not on file  Social History Narrative   Not on file   Social Determinants of  Health   Financial Resource Strain: Not on file  Food Insecurity: Food Insecurity Present (08/22/2022)   Hunger Vital Sign    Worried About Running Out of Food in the Last Year: Sometimes true    Ran Out of Food in the Last Year: Sometimes true  Transportation Needs: Patient Unable To Answer (08/22/2022)   PRAPARE - Transportation    Lack of Transportation (Medical): Patient unable to answer    Lack of Transportation (Non-Medical): Patient unable to answer  Recent Concern: Transportation Needs - Unmet Transportation Needs (06/13/2022)   Received from [facility], [facility]   [facility] - Transportation    Lack of Transportation (Medical): Not on file    Lack of Transportation (Non-Medical): Yes  Physical Activity: Not on file  Stress: Not on file  Social Connections: Unknown (08/06/2021)   Received from [facility], [facility]   Social Network    Social Network: Not on file   Additional Social History:                         Sleep: Good  Appetite:  Good  Current Medications: Current Facility-Administered Medications  Medication Dose Route Frequency Provider Last Rate Last Admin   acetaminophen (TYLENOL) tablet 650 mg  650 mg Oral Q6H  PRN [name], DO   650 mg at 01/28/23 0846   alum & mag hydroxide-simeth (MAALOX/MYLANTA) 200-200-20 MG/5ML suspension 30 mL  30 mL Oral Q4H PRN [name], DO   30 mL at 09/20/22 1142   aspirin EC tablet 81 mg  81 mg Oral Daily [name], MD   81 mg at 01/28/23 0846   atorvastatin (LIPITOR) tablet 20 mg  20 mg Oral Daily [name], MD   20 mg at 01/28/23 0846   diphenhydrAMINE (BENADRYL) capsule 50 mg  50 mg Oral Q6H PRN [name], DO   50 mg at 09/29/22 2254   Or   diphenhydrAMINE (BENADRYL) injection 50 mg  50 mg Intramuscular Q6H PRN [name], DO       glimepiride (AMARYL) tablet 4 mg  4 mg Oral Q breakfast [name], MD   4 mg at 01/28/23 0846   haloperidol  (HALDOL) tablet 5 mg  5 mg Oral Q6H PRN [name], DO   5 mg at 09/28/22 2149   Or   haloperidol lactate (HALDOL) injection 5 mg  5 mg Intramuscular Q6H PRN [name], DO       ibuprofen (ADVIL) tablet 600 mg  600 mg Oral Q6H PRN [name], [name], MD   600 mg at 01/18/23 1610   LORazepam (ATIVAN) tablet 1 mg  1 mg Oral TID PRN [name], DO   1 mg at 01/23/23 2133   magnesium hydroxide (MILK OF MAGNESIA) suspension 30 mL  30 mL Oral Daily PRN [name], DO   30 mL at 11/08/22 1012   metFORMIN (GLUCOPHAGE) tablet 500 mg  500 mg Oral Q breakfast [name], MD   500 mg at 01/28/23 0846   oxybutynin (DITROPAN) tablet 5 mg  5 mg Oral BID [name], MD   5 mg at 01/28/23 0846   paliperidone (INVEGA SUSTENNA) injection 156 mg  156 mg Intramuscular Q28 days [name], MD   156 mg at 01/15/23 1501   traZODone (DESYREL) tablet 50 mg  50 mg Oral QHS PRN [name], DO   50 mg at 01/27/23 2116    Lab Results:  Results for orders placed or performed during the hospital encounter of 08/22/22 (from the past 48 hour(s))  Lipid panel     Status: Abnormal   Collection Time: 01/27/23  6:08 AM  Result Value Ref Range   Cholesterol 259 (H) 0 - 200 mg/dL   Triglycerides 960 (H) <150 mg/dL   HDL 65 >45 mg/dL   Total CHOL/HDL Ratio 4.0 RATIO   VLDL 31 0 - 40 mg/dL   LDL Cholesterol 409 (H) 0 - 99 mg/dL    Comment:        Total Cholesterol/HDL:CHD Risk Coronary Heart Disease Risk Table                     Men   Women  1/2 Average Risk   3.4   3.3  Average Risk       5.0   4.4  2 X Average Risk   9.6   7.1  3 X Average Risk  23.4   11.0        Use the calculated Patient Ratio above and the CHD Risk Table to determine the patient's CHD Risk.        ATP III CLASSIFICATION (LDL):  <100     mg/dL  Optimal  100-129  mg/dL   Near or Above                    Optimal  130-159  mg/dL   Borderline  161-096  mg/dL   High   >045     mg/dL   Very High Performed at [facility], [address] [place], [place] [address]   Hemoglobin A1c     Status: Abnormal   Collection Time: 01/27/23  6:08 AM  Result Value Ref Range   Hgb A1c MFr Bld 11.0 (H) 4.8 - 5.6 %    Comment: (NOTE) Pre diabetes:          5.7%-6.4%  Diabetes:              >6.4%  Glycemic control for   <7.0% adults with diabetes    Mean Plasma Glucose 269 mg/dL    Comment: Performed at [facility], [address] [place], [place] [address]  TSH     Status: None   Collection Time: 01/27/23  6:08 AM  Result Value Ref Range   TSH 1.773 0.350 - 4.500 uIU/mL    Comment: Performed by a 3rd Generation assay with a functional sensitivity of <=0.01 uIU/mL. Performed at [facility], [address] [place], [place] [address]   Glucose, random     Status: Abnormal   Collection Time: 01/28/23  6:36 AM  Result Value Ref Range   Glucose, Bld 302 (H) 70 - 99 mg/dL    Comment: Glucose reference range applies only to samples taken after fasting for at least 8 hours. Performed at [facility], [address] [place], [place] [address]     Blood Alcohol level:  Lab Results  Component Value Date   [facility] <10 08/17/2022   ETH <10 01/11/2021    Metabolic Disorder Labs: Lab Results  Component Value Date   HGBA1C 11.0 (H) 01/27/2023   MPG 269 01/27/2023   No results found for: "PROLACTIN" Lab Results  Component Value Date   CHOL 259 (H) 01/27/2023   TRIG 155 (H) 01/27/2023   HDL 65 01/27/2023   CHOLHDL 4.0 01/27/2023   VLDL 31 01/27/2023   LDLCALC 163 (H) 01/27/2023    Physical Findings: AIMS:  , ,  ,  ,    CIWA:    COWS:     Musculoskeletal: Strength & Muscle Tone: within normal limits Gait & Station: normal Patient leans: N/A  Psychiatric Specialty Exam:  Presentation  General Appearance:  Casual; Neat  Eye Contact: Fair  Speech: Slow; Slurred  Speech  Volume: Decreased  Handedness: Right   Mood and Affect  Mood: Anxious  Affect: Inappropriate   Thought Process  Thought Processes: Disorganized  Descriptions of Associations:Loose  Orientation:Partial  Thought Content:Illogical; Rumination  History of Schizophrenia/Schizoaffective disorder:No data recorded Duration of Psychotic Symptoms:No data recorded Hallucinations:No data recorded Ideas of Reference:Paranoia  Suicidal Thoughts:No data recorded Homicidal Thoughts:No data recorded  Sensorium  Memory: Immediate Fair; Remote Poor  Judgment: Poor  Insight: Poor   Executive Functions  Concentration: Poor  Attention Span: Fair  Recall: [employer] of Knowledge: Fair  Language: Fair   Psychomotor Activity  Psychomotor Activity:No data recorded  Assets  Assets: Communication Skills   Sleep  Sleep:No data recorded    Blood pressure 107/64, pulse 99, temperature 98.3 F (36.8 C), resp. rate 18, height 5\' 6"  (1.676 m), weight (P) 82.8 kg, SpO2 94%. Body mass index is  29.46 kg/m (pended).   Treatment Plan Summary: Daily contact with patient to assess and evaluate symptoms and progress in treatment, Medication management, and Plan continue current medication.  Savannah Morford Tresea Mall, DO 01/28/2023, 1:32 PM

## 2023-01-28 NOTE — BH IP Treatment Plan (Signed)
Interdisciplinary Treatment and Diagnostic Plan Update  01/28/2023 Time of Session: 9:30 AM  Betty Henderson MRN: [id number]  Principal Diagnosis: Schizoaffective disorder, bipolar type (HCC)  Secondary Diagnoses: Principal Problem:   Schizoaffective disorder, bipolar type (HCC)   Current Medications:  Current Facility-Administered Medications  Medication Dose Route Frequency Provider Last Rate Last Admin   acetaminophen (TYLENOL) tablet 650 mg  650 mg Oral Q6H PRN [name], DO   650 mg at 01/28/23 0846   alum & mag hydroxide-simeth (MAALOX/MYLANTA) 200-200-20 MG/5ML suspension 30 mL  30 mL Oral Q4H PRN [name], DO   30 mL at 09/20/22 1142   aspirin EC tablet 81 mg  81 mg Oral Daily [name], MD   81 mg at 01/28/23 0846   atorvastatin (LIPITOR) tablet 20 mg  20 mg Oral Daily [name], MD   20 mg at 01/28/23 0846   diphenhydrAMINE (BENADRYL) capsule 50 mg  50 mg Oral Q6H PRN [name], DO   50 mg at 09/29/22 2254   Or   diphenhydrAMINE (BENADRYL) injection 50 mg  50 mg Intramuscular Q6H PRN [name], DO       glimepiride (AMARYL) tablet 4 mg  4 mg Oral Q breakfast [name], MD   4 mg at 01/28/23 0846   haloperidol (HALDOL) tablet 5 mg  5 mg Oral Q6H PRN [name], DO   5 mg at 09/28/22 2149   Or   haloperidol lactate (HALDOL) injection 5 mg  5 mg Intramuscular Q6H PRN [name], DO       ibuprofen (ADVIL) tablet 600 mg  600 mg Oral Q6H PRN [name], [name], MD   600 mg at 01/18/23 4098   LORazepam (ATIVAN) tablet 1 mg  1 mg Oral TID PRN [name], DO   1 mg at 01/23/23 2133   magnesium hydroxide (MILK OF MAGNESIA) suspension 30 mL  30 mL Oral Daily PRN [name], DO   30 mL at 11/08/22 1012   metFORMIN (GLUCOPHAGE) tablet 500 mg  500 mg Oral Q breakfast [name], MD   500 mg at 01/28/23 0846   oxybutynin (DITROPAN) tablet 5 mg  5 mg Oral BID [name], MD   5 mg at 01/28/23 0846   paliperidone (INVEGA SUSTENNA) injection 156 mg  156 mg Intramuscular Q28 days [name], MD   156 mg at 01/15/23 1501   traZODone (DESYREL) tablet 50 mg  50 mg Oral QHS PRN [name], DO   50 mg at 01/27/23 2116   PTA Medications: Medications Prior to Admission  Medication Sig Dispense Refill Last Dose   ondansetron (ZOFRAN) 4 MG tablet Take 1 tablet (4 mg total) by mouth every 8 (eight) hours as needed for nausea or vomiting. (Patient not taking: Reported on 08/17/2022) 20 tablet 0     Patient Stressors: Financial difficulties   Health problems   Medication change or noncompliance    Patient Strengths: Ability for insight   Treatment Modalities: Medication Management, Group therapy, Case management,  1 to 1 session with clinician, Psychoeducation, Recreational therapy.   Physician Treatment Plan for Primary Diagnosis: Schizoaffective disorder, bipolar type (HCC) Long Term Goal(s): Improvement in symptoms so as ready for discharge   Short Term Goals: Ability to identify changes in lifestyle to reduce recurrence of condition will improve Ability to verbalize feelings will improve Ability to disclose and discuss suicidal ideas Ability to demonstrate  self-control will improve Ability to identify and develop effective coping behaviors will improve Ability to maintain clinical measurements within normal limits will improve Compliance with prescribed medications will improve Ability to identify triggers associated with substance abuse/mental health issues will improve  Medication Management: Evaluate patient's response, side effects, and tolerance of medication regimen.  Therapeutic Interventions: 1 to 1 sessions, Unit Group sessions and Medication administration.  Evaluation of Outcomes: Progressing  Physician Treatment Plan for Secondary Diagnosis: Principal Problem:   Schizoaffective disorder, bipolar type (HCC)  Long  Term Goal(s): Improvement in symptoms so as ready for discharge   Short Term Goals: Ability to identify changes in lifestyle to reduce recurrence of condition will improve Ability to verbalize feelings will improve Ability to disclose and discuss suicidal ideas Ability to demonstrate self-control will improve Ability to identify and develop effective coping behaviors will improve Ability to maintain clinical measurements within normal limits will improve Compliance with prescribed medications will improve Ability to identify triggers associated with substance abuse/mental health issues will improve     Medication Management: Evaluate patient's response, side effects, and tolerance of medication regimen.  Therapeutic Interventions: 1 to 1 sessions, Unit Group sessions and Medication administration.  Evaluation of Outcomes: Progressing   RN Treatment Plan for Primary Diagnosis: Schizoaffective disorder, bipolar type (HCC) Long Term Goal(s): Knowledge of disease and therapeutic regimen to maintain health will improve  Short Term Goals: Ability to remain free from injury will improve, Ability to verbalize frustration and anger appropriately will improve, Ability to demonstrate self-control, Ability to participate in decision making will improve, Ability to verbalize feelings will improve, Ability to disclose and discuss suicidal ideas, Ability to identify and develop effective coping behaviors will improve, and Compliance with prescribed medications will improve  Medication Management: RN will administer medications as ordered by provider, will assess and evaluate patient's response and provide education to patient for prescribed medication. RN will report any adverse and/or side effects to prescribing provider.  Therapeutic Interventions: 1 on 1 counseling sessions, Psychoeducation, Medication administration, Evaluate responses to treatment, Monitor vital signs and CBGs as ordered,  Perform/monitor CIWA, COWS, AIMS and Fall Risk screenings as ordered, Perform wound care treatments as ordered.  Evaluation of Outcomes: Progressing   LCSW Treatment Plan for Primary Diagnosis: Schizoaffective disorder, bipolar type (HCC) Long Term Goal(s): Safe transition to appropriate next level of care at discharge, Engage patient in therapeutic group addressing interpersonal concerns.  Short Term Goals: Engage patient in aftercare planning with referrals and resources, Increase social support, Increase ability to appropriately verbalize feelings, Increase emotional regulation, Facilitate acceptance of mental health diagnosis and concerns, Facilitate patient progression through stages of change regarding substance use diagnoses and concerns, Identify triggers associated with mental health/substance abuse issues, and Increase skills for wellness and recovery  Therapeutic Interventions: Assess for all discharge needs, 1 to 1 time with Social worker, Explore available resources and support systems, Assess for adequacy in community support network, Educate family and significant other(s) on suicide prevention, Complete Psychosocial Assessment, Interpersonal group therapy.  Evaluation of Outcomes: Progressing   Progress in Treatment: Attending groups: Yes. and No. Participating in groups: Yes. and No. Taking medication as prescribed: Yes. Toleration medication: Yes. Family/Significant other contact made: Yes, individual(s) contacted:  Alphonzo Severance, son  Patient understands diagnosis: Yes. Discussing patient identified problems/goals with staff: Yes. Medical problems stabilized or resolved: Yes. Denies suicidal/homicidal ideation: Yes. Issues/concerns per patient self-inventory: No. Other: None   New problem(s) identified: No, Describe:  none Updates 12/12/22: No changes made  at this time Updates 12/17/22: No changes made at this time Update 12/24/22 No changes at this time. Update  12/29/2022: No changes at this time. Update 01/03/23: No changes at this time Update 01/08/23: No changes at this time Update 01/13/23: No changes at this time Update 01/18/23: None at this time. Update 01/23/2023:  No changes at this time. Update 01/28/23: Pt recently diagnosed with Diabetes Type II   New Short Term/Long Term Goal(s): Update 6/30: none at this time. Update 09/27/2022:  No changes at this time.  Update 10/02/2022:  No changes at this time. Update 10/07/22: No changes at this time 10/12/22: No changes at this time Update 10/18/22: No changes at this time Update 10/23/22: No changes at this time Update 10/28/22: No changes at this time Update 11/07/22: No changes at this time Update 11/12/22: No changes at this time  Update 11/17/22: None at this time. Update 11/22/22: None at this time. Update 11/27/22 No changes at this time. Update 12/02/22 No changes at this time  Update 12/07/22: None at this time. Updates 12/12/22: No changes made at this time Updates 12/17/22: No changes made at this time Update 12/24/22 No changes at this time. Update 12/29/2022: No changes at this time. Update 01/03/23: No changes at this time Update 01/08/23: No changes at this time Update 01/13/23: No changes at this time   Update 01/18/23: No changes at this time. Update 01/23/2023:  No changes at this time. Update 01/28/2023:  No changes at this time.    Patient Goals:  Update 6/30: none at this time. Update 09/27/2022:  No changes at this time. Update 10/02/2022:  No changes at this time. Update 10/07/22: No changes at this time 10/12/22: No changes at this time Update 10/18/22: No changes at this time Update 10/23/22: No changes at this time Update 10/28/22: No changes at this time Update 11/07/22: No changes at this time Update 11/12/22: No changes at this time Update 11/17/22: None at this time. Update 11/22/22: None at this time. Update 11/27/22 No changes at this time Update 12/02/22 No changes at this time   Update 12/07/22: None at this time.  Updates 12/12/22: No changes made at this time Updates 12/17/22: No changes made at this time Update 12/24/22 No changes at this time. Update 12/29/2022: No changes at this time. Update 01/03/23: No changes at this time Update 01/08/23: No changes at this time Update 01/13/23: No changes at this time   Update 01/18/23: No changes at this time. Update 01/23/2023:  No changes at this time.  Update 01/28/2023:  No changes at this time.   Discharge Plan or Barriers: Update 6/30: APS report has been made and patient is being investigated for guardianship needs.  Remains homeless with limited supports. Update 09/27/2022:  No changes at this time.  Update 10/02/2022:  Patient remains safe on the unit at this time.  Patient remains psychotic at this time.  APS report has been made, however, no follow up from the caseworker on her case.  No safe discharge identified.  CSW has requested that application for Medicaid and disability be completed.   Update 10/07/22: No changes at this time 10/12/22: No changes at this time Update 10/18/22: No changes at this time Update 10/23/22: No changes at this time Update 10/23/22: No changes at this time Update 10/28/22: According to [facility], pt's caseworker at DSS, the petition was filed for guardianship on 10/21/22, now awaiting for the petition to be approvedUpdate 11/07/22: No changes at this  time Update 11/12/22: CSW sent over patients information to [facility] and [facility], awaiting a response. CSW awaiting word from DSS regarding pt's petition and what agency will become her guardian. Update 11/17/22: No changes at this time. Update 11/22/22: CSW has sent patients information to multiple facilities that were suggested by leadership, pt has been denied from the facility or the facility does not respond. CSW continues to send pt's information to nursing homes. Update 11/27/22 CSW contacted DSS to inquire about the status of guardianship. No response at this time Update 12/02/22 Supervisor  Loraine Leriche contacted DSS who stated they are not taking pt on for guardianship but that Quincy Carnes will be present tomorrow to do a visit with social worker and pt and that pt's son will be signing documentation for placement for the pt. Update 12/07/22: [facility] DSS has declined to take guardianship. Son, Casimiro Needle is to step into a more present role in deciding placement. A conversation is still needed. Updates 12/12/22: CSW staffed case with leadership and they state that a family meeting must happen with pt's son. CSW contacted pt's financial navigator and her DSS worker so that they may be present on the call and are able to speak to the efforts they have made to secure pt funding and housing. CSW waiting on responses from both, leadership is aware. Updates 12/17/22: CSW awaiting updates from both [facility] DSS and [facility] financial navigators as to status of pt's applications for Medicaid/[employer] and Disability, respectively. Update 12/24/22 CSW sent email regarding family meeting per request of supervisors. CSW awaiting responses from all parties to schedule family meeting. Update: 12/29/2022 No changes at this time. Update 01/03/23: CSW continues to await responses on the scheduling of the family meeting. CSW received call from Primitivo Gauze at [employer], CSW sent FL2 to [place] to aide in pt's discharge planning. Update 01/08/23: CSW has reached out again to leadership and financial counseling. CSW continues to await a response. CSW called Sharlee Blew, awaiting a response for status of pt's placement options. Update 01/13/23: Pt is to meet with psychologist tomorrow 10/22 to complete capacity testing so that DSS can retry for guardianship  Update 01/18/23: The patient is still awaiting placement.  Update 01/23/2023:  Patient remains safe on the unit at this time.  CSW team continues to work on placement.  CSW team in contact with DSS to develop plan on guardianship.   Work continues to  Hospital doctor. Update 01/28/2023:  Medicaid application remains pending. DSS awaiting records from medical records to retry fo guardianship.    Reason for Continuation of Hospitalization: Hallucinations Mania Medication stabilization   Estimated Length of Stay:  Update 6/30: 1-7 days Update 09/27/2022:  TBD Update 10/02/2022:  No changes at this time. UpdaUpdate 10/18/22: No changes at this time te 10/07/22: No changes at this time Update 10/18/22: No changes at this time Update 10/23/22: No changes at this time Update 10/28/22: No changes at this time Update 11/07/22: No changes at this time Update 11/12/22: No changes at this time  Update 11/17/22: No changes at this time. Update 11/22/22: None at this time. Update 11/27/22 No changes at this time. Update 12/02/22 No changes at this time  Update 12/07/22: None at this time. Updates 12/12/22: No changes made at this time Updates 12/17/22: TBD. Update 12/29/2022: No changes at this time. Update 01/03/23: TBDUpdate 01/08/23: TBD Update 01/13/23: TBD  Update 01/18/23: No changes at this time.  Update 01/23/2023:  TBD Update 01/28/2023:  TBD Last 3 [place] Suicide Severity Risk Score: Flowsheet Row Admission (Current) from 08/22/2022 in [facility] ED from 08/17/2022 in [facility] Department at [facility] ED from 08/12/2022 in [facility] Emergency Department at [facility]  C-SSRS RISK CATEGORY Low Risk No Risk No Risk       Last [facility] 2/9 Scores:     No data to display          Scribe for Treatment Team: Elza Rafter, Theresia Majors 01/28/2023 11:27 AM

## 2023-01-28 NOTE — Group Note (Signed)
Date:  01/28/2023 Time:  10:41 PM  Group Topic/Focus:  Self Care:   The focus of this group is to help patients understand the importance of self-care in order to improve or restore emotional, physical, spiritual, interpersonal, and financial health.    Participation Level:  Did Not Attend  Participation Quality:      Affect:      Cognitive:      Insight: None  Engagement in Group:  None  Modes of Intervention:      Additional Comments:    [name] 01/28/2023, 10:41 PM

## 2023-01-28 NOTE — Progress Notes (Signed)
   01/28/23 0900  Psych Admission Type (Psych Patients Only)  Admission Status Voluntary  Psychosocial Assessment  Patient Complaints None  Eye Contact Fair  Facial Expression Flat  Affect Appropriate to circumstance  Speech Soft;Slurred  Interaction Minimal  Motor Activity Shuffling  Appearance/Hygiene Unremarkable  Behavior Characteristics Cooperative;Calm  Mood Pleasant;Depressed  Thought Process  Coherency WDL  Content WDL  Delusions None reported or observed  Perception WDL  Hallucination None reported or observed  Judgment Impaired  Confusion Mild  Danger to Self  Current suicidal ideation? Denies  Danger to Others  Danger to Others None reported or observed  Danger to Others Abnormal  Harmful Behavior to others No threats or harm toward other people

## 2023-01-28 NOTE — Inpatient Diabetes Management (Signed)
Inpatient Diabetes Program Recommendations  AACE/ADA: New Consensus Statement on Inpatient Glycemic Control (2015)  Target Ranges:  Prepandial:   less than 140 mg/dL      Peak postprandial:   less than 180 mg/dL (1-2 hours)      Critically ill patients:  140 - 180 mg/dL   Lab Results  Component Value Date   GLUCAP 98 07/21/2022   HGBA1C 11.0 (H) 01/27/2023    Latest Reference Range & Units 01/26/23 06:00 01/28/23 06:36  Glucose 70 - 99 mg/dL 161 (H) 096 (H)  (H): Data is abnormally high  Review of Glycemic Control  Diabetes history: New Onset DM2 Outpatient Diabetes medications: None Current orders for Inpatient glycemic control: Amaryl 4 mg daily, Metformin 500 mg daily  Inpatient Diabetes Program Recommendations:   Noted glucose 302 this am and patient is new onset with A1c of 11 and may need to avoid insulin.  Please consider if appropriate: -Semglee 16 units daily (0.2 units/kg x 82.8 kg)  Thank you, Darel Hong E. Glynn Yepes, RN, MSN, CDCES  Diabetes Coordinator Inpatient Glycemic Control Team Team Pager [phone] (8am-5pm) 01/28/2023 9:49 AM

## 2023-01-29 DIAGNOSIS — F250 Schizoaffective disorder, bipolar type: Secondary | ICD-10-CM | POA: Diagnosis not present

## 2023-01-29 LAB — GLUCOSE, RANDOM: Glucose, Bld: 279 mg/dL — ABNORMAL HIGH (ref 70–99)

## 2023-01-29 NOTE — Progress Notes (Signed)
[facility] MD Progress Note  01/29/2023 1:30 PM Betty Henderson  MRN:  [id number] Subjective: Betty Henderson is seen on rounds.  No changes with her.  She been compliant with medications.  Still no discharge disposition.  She has not been any problems on the unit. Principal Problem: Schizoaffective disorder, bipolar type (HCC) Diagnosis: Principal Problem:   Schizoaffective disorder, bipolar type (HCC)  Total Time spent with patient: 15 minutes  Past Psychiatric History: Schizoaffective disorder  Past Medical History:  Past Medical History:  Diagnosis Date   Anemia    Arthritis    Chronic pain    Drug-seeking behavior    Malingering    Osteopetrosis    Psychosis (HCC)    Schizoaffective disorder, bipolar type (HCC)     Past Surgical History:  Procedure Laterality Date   MOUTH SURGERY     TUBAL LIGATION     Family History:  Family History  Family history unknown: Yes   Family Psychiatric  History: Unremarkable Social History:  Social History   Substance and Sexual Activity  Alcohol Use Not Currently   Comment: 1 cocktail 3 weeks ago     Social History   Substance and Sexual Activity  Drug Use Not Currently   Types: Cocaine   Comment: states "it's legal though"    Social History   Socioeconomic History   Marital status: Single    Spouse name: Not on file   Number of children: Not on file   Years of education: Not on file   Highest education level: Not on file  Occupational History   Not on file  Tobacco Use   Smoking status: Every Day    Current packs/day: 0.50    Types: Cigarettes   Smokeless tobacco: Never  Vaping Use   Vaping status: Never Used  Substance and Sexual Activity   Alcohol use: Not Currently    Comment: 1 cocktail 3 weeks ago   Drug use: Not Currently    Types: Cocaine    Comment: states "it's legal though"   Sexual activity: Never  Other Topics Concern   Not on file  Social History Narrative   Not on file   Social Determinants of Health    Financial Resource Strain: Not on file  Food Insecurity: Food Insecurity Present (08/22/2022)   Hunger Vital Sign    Worried About Running Out of Food in the Last Year: Sometimes true    Ran Out of Food in the Last Year: Sometimes true  Transportation Needs: Patient Unable To Answer (08/22/2022)   PRAPARE - Transportation    Lack of Transportation (Medical): Patient unable to answer    Lack of Transportation (Non-Medical): Patient unable to answer  Recent Concern: Transportation Needs - Unmet Transportation Needs (06/13/2022)   Received from [facility]   [facility] - Transportation    Lack of Transportation (Medical): Not on file    Lack of Transportation (Non-Medical): Yes  Physical Activity: Not on file  Stress: Not on file  Social Connections: Unknown (08/06/2021)   Received from [facility], [facility]   Social Network    Social Network: Not on file   Additional Social History:                         Sleep: Good  Appetite:  Good  Current Medications: Current Facility-Administered Medications  Medication Dose Route Frequency Provider Last Rate Last Admin   acetaminophen (TYLENOL) tablet 650 mg  650 mg Oral  Q6H PRN [name], DO   650 mg at 01/28/23 2012   alum & mag hydroxide-simeth (MAALOX/MYLANTA) 200-200-20 MG/5ML suspension 30 mL  30 mL Oral Q4H PRN [name], DO   30 mL at 09/20/22 1142   aspirin EC tablet 81 mg  81 mg Oral Daily [name], MD   81 mg at 01/29/23 0752   atorvastatin (LIPITOR) tablet 20 mg  20 mg Oral Daily [name], MD   20 mg at 01/29/23 9147   diphenhydrAMINE (BENADRYL) capsule 50 mg  50 mg Oral Q6H PRN [name], DO   50 mg at 09/29/22 2254   Or   diphenhydrAMINE (BENADRYL) injection 50 mg  50 mg Intramuscular Q6H PRN [name], DO       glimepiride (AMARYL) tablet 4 mg  4 mg Oral Q breakfast [name], MD   4 mg at 01/29/23 8295   haloperidol (HALDOL)  tablet 5 mg  5 mg Oral Q6H PRN [name], DO   5 mg at 09/28/22 2149   Or   haloperidol lactate (HALDOL) injection 5 mg  5 mg Intramuscular Q6H PRN [name], DO       ibuprofen (ADVIL) tablet 600 mg  600 mg Oral Q6H PRN [name], [name], MD   600 mg at 01/29/23 0751   LORazepam (ATIVAN) tablet 1 mg  1 mg Oral TID PRN [name], DO   1 mg at 01/23/23 2133   magnesium hydroxide (MILK OF MAGNESIA) suspension 30 mL  30 mL Oral Daily PRN [name], DO   30 mL at 11/08/22 1012   metFORMIN (GLUCOPHAGE) tablet 500 mg  500 mg Oral Q breakfast [name], MD   500 mg at 01/29/23 0751   oxybutynin (DITROPAN) tablet 5 mg  5 mg Oral BID [name], MD   5 mg at 01/29/23 0751   paliperidone (INVEGA SUSTENNA) injection 156 mg  156 mg Intramuscular Q28 days [name], MD   156 mg at 01/15/23 1501   traZODone (DESYREL) tablet 50 mg  50 mg Oral QHS PRN [name], DO   50 mg at 01/28/23 2013    Lab Results:  Results for orders placed or performed during the hospital encounter of 08/22/22 (from the past 48 hour(s))  Glucose, random     Status: Abnormal   Collection Time: 01/28/23  6:36 AM  Result Value Ref Range   Glucose, Bld 302 (H) 70 - 99 mg/dL    Comment: Glucose reference range applies only to samples taken after fasting for at least 8 hours. Performed at [facility], [address] [place], [place] [address]   Glucose, random     Status: Abnormal   Collection Time: 01/29/23  6:16 AM  Result Value Ref Range   Glucose, Bld 279 (H) 70 - 99 mg/dL    Comment: Glucose reference range applies only to samples taken after fasting for at least 8 hours. Performed at [facility], [address] [place], [place] [address]     Blood Alcohol level:  Lab Results  Component Value Date   [facility] <10 08/17/2022   ETH <10 01/11/2021    Metabolic Disorder Labs: Lab Results  Component Value Date   HGBA1C  11.0 (H) 01/27/2023   MPG 269 01/27/2023   No results found for: "PROLACTIN" Lab Results  Component Value Date   CHOL 259 (H) 01/27/2023   TRIG 155 (  H) 01/27/2023   HDL 65 01/27/2023   CHOLHDL 4.0 01/27/2023   VLDL 31 01/27/2023   LDLCALC 163 (H) 01/27/2023    Physical Findings: AIMS:  , ,  ,  ,    CIWA:    COWS:     Musculoskeletal: Strength & Muscle Tone: within normal limits Gait & Station: normal Patient leans: N/A  Psychiatric Specialty Exam:  Presentation  General Appearance:  Casual; Neat  Eye Contact: Fair  Speech: Slow; Slurred  Speech Volume: Decreased  Handedness: Right   Mood and Affect  Mood: Anxious  Affect: Inappropriate   Thought Process  Thought Processes: Disorganized  Descriptions of Associations:Loose  Orientation:Partial  Thought Content:Illogical; Rumination  History of Schizophrenia/Schizoaffective disorder:No data recorded Duration of Psychotic Symptoms:No data recorded Hallucinations:No data recorded Ideas of Reference:Paranoia  Suicidal Thoughts:No data recorded Homicidal Thoughts:No data recorded  Sensorium  Memory: Immediate Fair; Remote Poor  Judgment: Poor  Insight: Poor   Executive Functions  Concentration: Poor  Attention Span: Fair  Recall: [employer] of Knowledge: Fair  Language: Fair   Psychomotor Activity  Psychomotor Activity:No data recorded  Assets  Assets: Communication Skills   Sleep  Sleep:No data recorded    Blood pressure (!) 138/53, pulse 66, temperature (!) 97.5 F (36.4 C), resp. rate 18, height 5\' 6"  (1.676 m), weight (P) 82.8 kg, SpO2 96%. Body mass index is 29.46 kg/m (pended).   Treatment Plan Summary: Daily contact with patient to assess and evaluate symptoms and progress in treatment, Medication management, and Plan continue current medications.  Sri Clegg Tresea Mall, DO 01/29/2023, 1:30 PM

## 2023-01-29 NOTE — Progress Notes (Signed)
   01/28/23 2200  Psych Admission Type (Psych Patients Only)  Admission Status Voluntary  Psychosocial Assessment  Patient Complaints None  Eye Contact Fair  Facial Expression Flat  Affect Appropriate to circumstance  Speech Soft;Slurred  Interaction Minimal  Motor Activity Shuffling  Appearance/Hygiene Unremarkable  Behavior Characteristics Cooperative;Calm  Mood Pleasant;Depressed  Thought Process  Coherency WDL  Content WDL  Delusions None reported or observed  Perception WDL  Hallucination None reported or observed  Judgment Impaired  Confusion Mild  Danger to Self  Current suicidal ideation? Denies  Agreement Not to Harm Self Yes  Description of Agreement Verbal  Danger to Others  Danger to Others None reported or observed (Verbal)

## 2023-01-29 NOTE — Group Note (Signed)
Recreation Therapy Group Note   Group Topic:Goal Setting  Group Date: 01/29/2023 Start Time: 1400 End Time: 1450 Facilitators: Rosina Lowenstein, LRT, CTRS Location:  Craft Room  Group Description: Product/process development scientist. Patients were given many different magazines, a glue stick, markers, and a piece of cardstock paper. LRT and pts discussed the importance of having goals in life. LRT and pts discussed the difference between short-term and long-term goals, as well as what a SMART goal is. LRT encouraged pts to create a vision board, with images they picked and then cut out with safety scissors from the magazine, for themselves, that capture their short and long-term goals. LRT encouraged pts to show and explain their vision board to the group.   Goal Area(s) Addressed:  Patient will gain knowledge of short vs. long term goals.  Patient will identify goals for themselves. Patient will practice setting SMART goals. Patient will verbalize their goals to LRT and peers.   Affect/Mood: N/A   Participation Level: Did not attend    Clinical Observations/Individualized Feedback: Betty Henderson did not attend group.   Plan: Continue to engage patient in RT group sessions 2-3x/week.   Rosina Lowenstein, LRT, CTRS 01/29/2023 2:57 PM

## 2023-01-29 NOTE — Progress Notes (Signed)
   01/29/23 2200  Psych Admission Type (Psych Patients Only)  Admission Status Voluntary  Psychosocial Assessment  Patient Complaints None  Eye Contact Fair  Facial Expression Flat  Affect Appropriate to circumstance  Speech Soft;Slurred  Interaction Minimal  Motor Activity Shuffling  Appearance/Hygiene Unremarkable  Behavior Characteristics Cooperative;Calm  Mood Depressed;Pleasant  Aggressive Behavior  Type of Behavior Verbal  Effect No apparent injury  Thought Process  Coherency WDL  Content WDL  Delusions None reported or observed  Perception WDL  Hallucination None reported or observed  Judgment Impaired  Confusion Mild  Danger to Self  Current suicidal ideation? Denies  Agreement Not to Harm Self Yes  Description of Agreement verbal  Danger to Others  Danger to Others None reported or observed  Danger to Others Abnormal  Harmful Behavior to others No threats or harm toward other people  Destructive Behavior No threats or harm toward property

## 2023-01-29 NOTE — Group Note (Signed)
Date:  01/29/2023 Time:  10:47 AM  Group Topic/Focus:  Movement Therapy    Participation Level:  Did Not Attend   Rodena Goldmann 01/29/2023, 10:47 AM

## 2023-01-29 NOTE — Plan of Care (Signed)
  Problem: Education: Goal: Knowledge of General Education information will improve Description: Including pain rating scale, medication(s)/side effects and non-pharmacologic comfort measures Outcome: Progressing   Problem: Health Behavior/Discharge Planning: Goal: Ability to manage health-related needs will improve Outcome: Progressing   Problem: Clinical Measurements: Goal: Ability to maintain clinical measurements within normal limits will improve Outcome: Progressing Goal: Diagnostic test results will improve Outcome: Progressing   Problem: Nutrition: Goal: Adequate nutrition will be maintained Outcome: Progressing   Problem: Coping: Goal: Level of anxiety will decrease Outcome: Progressing   Problem: Elimination: Goal: Will not experience complications related to bowel motility Outcome: Progressing Goal: Will not experience complications related to urinary retention Outcome: Progressing   Problem: Pain Managment: Goal: General experience of comfort will improve Outcome: Progressing   Problem: Safety: Goal: Ability to remain free from injury will improve Outcome: Progressing   Problem: Skin Integrity: Goal: Risk for impaired skin integrity will decrease Outcome: Progressing

## 2023-01-30 DIAGNOSIS — F250 Schizoaffective disorder, bipolar type: Secondary | ICD-10-CM | POA: Diagnosis not present

## 2023-01-30 LAB — C-PEPTIDE: C-Peptide: 3.5 ng/mL (ref 1.1–4.4)

## 2023-01-30 LAB — GLUCOSE, RANDOM: Glucose, Bld: 263 mg/dL — ABNORMAL HIGH (ref 70–99)

## 2023-01-30 NOTE — Progress Notes (Signed)
[facility] MD Progress Note  01/30/2023 12:28 PM Betty Henderson  MRN:  [id number] Subjective: Betty Henderson is seen on rounds.  No changes.  No new information.  Denies any problems.  Nurses report no issues. Principal Problem: Schizoaffective disorder, bipolar type (HCC) Diagnosis: Principal Problem:   Schizoaffective disorder, bipolar type (HCC)  Total Time spent with patient: 15 minutes  Past Psychiatric History: Schizoaffective disorder  Past Medical History:  Past Medical History:  Diagnosis Date   Anemia    Arthritis    Chronic pain    Drug-seeking behavior    Malingering    Osteopetrosis    Psychosis (HCC)    Schizoaffective disorder, bipolar type (HCC)     Past Surgical History:  Procedure Laterality Date   MOUTH SURGERY     TUBAL LIGATION     Family History:  Family History  Family history unknown: Yes   Family Psychiatric  History: Unremarkable Social History:  Social History   Substance and Sexual Activity  Alcohol Use Not Currently   Comment: 1 cocktail 3 weeks ago     Social History   Substance and Sexual Activity  Drug Use Not Currently   Types: Cocaine   Comment: states "it's legal though"    Social History   Socioeconomic History   Marital status: Single    Spouse name: Not on file   Number of children: Not on file   Years of education: Not on file   Highest education level: Not on file  Occupational History   Not on file  Tobacco Use   Smoking status: Every Day    Current packs/day: 0.50    Types: Cigarettes   Smokeless tobacco: Never  Vaping Use   Vaping status: Never Used  Substance and Sexual Activity   Alcohol use: Not Currently    Comment: 1 cocktail 3 weeks ago   Drug use: Not Currently    Types: Cocaine    Comment: states "it's legal though"   Sexual activity: Never  Other Topics Concern   Not on file  Social History Narrative   Not on file   Social Determinants of Health   Financial Resource Strain: Not on file  Food  Insecurity: Food Insecurity Present (08/22/2022)   Hunger Vital Sign    Worried About Running Out of Food in the Last Year: Sometimes true    Ran Out of Food in the Last Year: Sometimes true  Transportation Needs: Patient Unable To Answer (08/22/2022)   PRAPARE - Transportation    Lack of Transportation (Medical): Patient unable to answer    Lack of Transportation (Non-Medical): Patient unable to answer  Recent Concern: Transportation Needs - Unmet Transportation Needs (06/13/2022)   Received from [facility], [facility]   [facility] - Transportation    Lack of Transportation (Medical): Not on file    Lack of Transportation (Non-Medical): Yes  Physical Activity: Not on file  Stress: Not on file  Social Connections: Unknown (08/06/2021)   Received from [facility], [facility]   Social Network    Social Network: Not on file   Additional Social History:                         Sleep: Good  Appetite:  Good  Current Medications: Current Facility-Administered Medications  Medication Dose Route Frequency Provider Last Rate Last Admin   acetaminophen (TYLENOL) tablet 650 mg  650 mg Oral Q6H PRN [name], DO   650 mg  at 01/28/23 2012   alum & mag hydroxide-simeth (MAALOX/MYLANTA) 200-200-20 MG/5ML suspension 30 mL  30 mL Oral Q4H PRN [name], DO   30 mL at 09/20/22 1142   aspirin EC tablet 81 mg  81 mg Oral Daily [name], MD   81 mg at 01/30/23 0945   atorvastatin (LIPITOR) tablet 20 mg  20 mg Oral Daily [name], MD   20 mg at 01/30/23 0945   diphenhydrAMINE (BENADRYL) capsule 50 mg  50 mg Oral Q6H PRN [name], DO   50 mg at 09/29/22 2254   Or   diphenhydrAMINE (BENADRYL) injection 50 mg  50 mg Intramuscular Q6H PRN [name], DO       glimepiride (AMARYL) tablet 4 mg  4 mg Oral Q breakfast [name], MD   4 mg at 01/30/23 0736   haloperidol (HALDOL) tablet 5 mg  5 mg Oral Q6H PRN [name], DO   5 mg at 09/28/22 2149   Or   haloperidol lactate (HALDOL) injection 5 mg  5 mg Intramuscular Q6H PRN [name], DO       ibuprofen (ADVIL) tablet 600 mg  600 mg Oral Q6H PRN [name], [name], MD   600 mg at 01/29/23 0751   LORazepam (ATIVAN) tablet 1 mg  1 mg Oral TID PRN [name], DO   1 mg at 01/23/23 2133   magnesium hydroxide (MILK OF MAGNESIA) suspension 30 mL  30 mL Oral Daily PRN [name], DO   30 mL at 11/08/22 1012   metFORMIN (GLUCOPHAGE) tablet 500 mg  500 mg Oral Q breakfast [name], MD   500 mg at 01/30/23 0736   oxybutynin (DITROPAN) tablet 5 mg  5 mg Oral BID [name], MD   5 mg at 01/30/23 1100   paliperidone (INVEGA SUSTENNA) injection 156 mg  156 mg Intramuscular Q28 days [name], MD   156 mg at 01/15/23 1501   traZODone (DESYREL) tablet 50 mg  50 mg Oral QHS PRN [name], DO   50 mg at 01/29/23 2057    Lab Results:  Results for orders placed or performed during the hospital encounter of 08/22/22 (from the past 48 hour(s))  Glucose, random     Status: Abnormal   Collection Time: 01/29/23  6:16 AM  Result Value Ref Range   Glucose, Bld 279 (H) 70 - 99 mg/dL    Comment: Glucose reference range applies only to samples taken after fasting for at least 8 hours. Performed at [facility], [address] [place], [place] [address]   Glucose, random     Status: Abnormal   Collection Time: 01/30/23  7:01 AM  Result Value Ref Range   Glucose, Bld 263 (H) 70 - 99 mg/dL    Comment: Glucose reference range applies only to samples taken after fasting for at least 8 hours. Performed at [facility], [address] [place], [place] [address]     Blood Alcohol level:  Lab Results  Component Value Date   [facility] <10 08/17/2022   ETH <10 01/11/2021    Metabolic Disorder Labs: Lab Results  Component Value Date   HGBA1C 11.0 (H) 01/27/2023   MPG 269 01/27/2023   No  results found for: "PROLACTIN" Lab Results  Component Value Date   CHOL 259 (H) 01/27/2023   TRIG 155 (H) 01/27/2023   HDL 65 01/27/2023   CHOLHDL  4.0 01/27/2023   VLDL 31 01/27/2023   LDLCALC 163 (H) 01/27/2023    Physical Findings: AIMS:  , ,  ,  ,    CIWA:    COWS:     Musculoskeletal: Strength & Muscle Tone: within normal limits Gait & Station: normal Patient leans: N/A  Psychiatric Specialty Exam:  Presentation  General Appearance:  Casual; Neat  Eye Contact: Fair  Speech: Slow; Slurred  Speech Volume: Decreased  Handedness: Right   Mood and Affect  Mood: Anxious  Affect: Inappropriate   Thought Process  Thought Processes: Disorganized  Descriptions of Associations:Loose  Orientation:Partial  Thought Content:Illogical; Rumination  History of Schizophrenia/Schizoaffective disorder:No data recorded Duration of Psychotic Symptoms:No data recorded Hallucinations:No data recorded Ideas of Reference:Paranoia  Suicidal Thoughts:No data recorded Homicidal Thoughts:No data recorded  Sensorium  Memory: Immediate Fair; Remote Poor  Judgment: Poor  Insight: Poor   Executive Functions  Concentration: Poor  Attention Span: Fair  Recall: [employer] of Knowledge: Fair  Language: Fair   Psychomotor Activity  Psychomotor Activity:No data recorded  Assets  Assets: Communication Skills   Sleep  Sleep:No data recorded    Blood pressure (!) 105/57, pulse 66, temperature (!) 97.3 F (36.3 C), resp. rate 18, height 5\' 6"  (1.676 m), weight (P) 82.8 kg, SpO2 97%. Body mass index is 29.46 kg/m (pended).   Treatment Plan Summary: Daily contact with patient to assess and evaluate symptoms and progress in treatment, Medication management, and Plan continue current medications.  [name], DO 01/30/2023, 12:28 PM

## 2023-01-30 NOTE — Progress Notes (Signed)
Recreation Therapy Notes  LRT provided Betty Henderson with finger nail care and painted her nails for her birthday.   Rasaan Brotherton LRT, CTRS Rosina Lowenstein 01/30/2023 3:40 PM

## 2023-01-30 NOTE — Plan of Care (Signed)
°  Problem: Clinical Measurements: Goal: Ability to maintain clinical measurements within normal limits will improve Outcome: Progressing Goal: Diagnostic test results will improve Outcome: Progressing   Problem: Nutrition: Goal: Adequate nutrition will be maintained Outcome: Progressing   Problem: Coping: Goal: Level of anxiety will decrease Outcome: Progressing   Problem: Elimination: Goal: Will not experience complications related to bowel motility Outcome: Progressing Goal: Will not experience complications related to urinary retention Outcome: Progressing

## 2023-01-30 NOTE — BHH Counselor (Signed)
CSW returning missed call from [place] at [facility] [phone])  Camillie reports that she has received medical records for pt and that she has sent them to county attorney so she can review them.  Camillie reports she will reach back out to CSW once they have word from the county attorney's office.   Reynaldo Minium, MSW, [place] 01/30/2023 1:01 PM

## 2023-01-30 NOTE — Group Note (Signed)
Date:  01/30/2023 Time:  11:08 AM  Group Topic/Focus:  Outside Rec/Music Therapy This group is to allows  patients to get fresh air participating in outside activities while listening to their favorite music.     Participation Level:  Active  Participation Quality:  Appropriate  Affect:  Appropriate  Cognitive:  Alert and Appropriate  Insight: Appropriate  Engagement in Group:  Engaged  Modes of Intervention:  Activity and Discussion  Additional Comments:    [name] 01/30/2023, 11:08 AM

## 2023-01-30 NOTE — Group Note (Signed)
Recreation Therapy Group Note   Group Topic:Relaxation  Group Date: 01/30/2023 Start Time: 1400 End Time: 1445 Facilitators: Rosina Lowenstein, LRT, CTRS Location:  Dayroom   Group Description: PMR (Progressive Muscle Relaxation). LRT asks patients their current level of stress/anxiety from 1-10, with 10 being the highest. LRT educates patients on what PMR is and the benefits that come from it. Patients are asked to sit with their feet flat on the floor while sitting up and all the way back in their chair, if possible. LRT and pts follow a prompt through a speaker that requires you to tense and release different muscles in their body and focus on their breathing. During session, lights are off and soft music is being played. Pts are given a stress ball to use if needed. At the end of the prompt, LRT asks patients to rank their current levels of stress/anxiety from 1-10, 10 being the highest.  Goal Area(s) Addressed:  Patients will be able to describe progressive muscle relaxation.  Patient will practice using relaxation technique. Patient will identify a new coping skill.  Patient will follow multistep directions to reduce anxiety and stress.  Affect/Mood: Appropriate   Participation Level: Active and Engaged   Participation Quality: Independent   Behavior: Calm and Cooperative   Speech/Thought Process: Coherent   Insight: Good   Judgement: Good   Modes of Intervention: Activity   Patient Response to Interventions:  Engaged   Education Outcome:  Acknowledges education   Clinical Observations/Individualized Feedback: Betty Henderson was active in their participation of session activities and group discussion. Pt identified that her stress and anxiety were a 10 before the session and afterwards they were a 3. Pt interacted well with LRT and peers duration of session.    Plan: Continue to engage patient in RT group sessions 2-3x/week.   Rosina Lowenstein, LRT, CTRS 01/30/2023 3:16 PM

## 2023-01-30 NOTE — Progress Notes (Signed)
Patient is admitted to [facility] for schizo-affective disorder - bipolar type. Francesa continues to be pleasant and cooperative. They have added on diabetic medications and carb modified diet d/t A1c of 11 and fasting BG of 263. Patient was in and out of her room. Interacted well with staff and peers. Denies SI, HI, AVH, depression and anxiety.  Will continue to monitor.

## 2023-01-30 NOTE — Group Note (Signed)
Date:  01/30/2023 Time:  9:31 PM  Group Topic/Focus:  Wrap-Up Group:   The focus of this group is to help patients review their daily goal of treatment and discuss progress on daily workbooks.    Participation Level:  Active  Participation Quality:  Appropriate  Affect:  Appropriate  Cognitive:  Appropriate  Insight: Appropriate  Engagement in Group:  Engaged  Modes of Intervention:  Discussion  Additional Comments:    Betty Henderson 01/30/2023, 9:31 PM

## 2023-01-31 DIAGNOSIS — F250 Schizoaffective disorder, bipolar type: Secondary | ICD-10-CM | POA: Diagnosis not present

## 2023-01-31 NOTE — Inpatient Diabetes Management (Signed)
Inpatient Diabetes Program Recommendations  AACE/ADA: New Consensus Statement on Inpatient Glycemic Control (2015)  Target Ranges:  Prepandial:   less than 140 mg/dL      Peak postprandial:   less than 180 mg/dL (1-2 hours)      Critically ill patients:  140 - 180 mg/dL    Diabetes history: New Onset DM2   Current orders: Amaryl 4 mg daily, Metformin 500 mg daily         MD- Please consider adding orders for CBG checks TID AC to check to see if the Amaryl and Metformin medications need any adjustment prior to d/c       --Will follow patient during hospitalization--  Ambrose Finland RN, MSN, CDCES Diabetes Coordinator Inpatient Glycemic Control Team Team Pager: [phone] (8a-5p)

## 2023-01-31 NOTE — Plan of Care (Signed)
Pt received in bed with eyes open. RR even and unlabored. C/o heartburn. Maalox 30 ml po given PRN for ingestion. Carb modified diet maintained. No s/s of hyper/hypoglycemia noted. Q 15 min checks maintained for safety. No behavior issues noted. Denies SI/HI/AVH and pain.   Problem: Nutrition: Goal: Adequate nutrition will be maintained Outcome: Progressing   Problem: Coping: Goal: Level of anxiety will decrease Outcome: Progressing   Problem: Elimination: Goal: Will not experience complications related to bowel motility Outcome: Progressing Goal: Will not experience complications related to urinary retention Outcome: Progressing   Problem: Pain Managment: Goal: General experience of comfort will improve Outcome: Progressing   Problem: Safety: Goal: Ability to remain free from injury will improve Outcome: Progressing   Problem: Skin Integrity: Goal: Risk for impaired skin integrity will decrease Outcome: Progressing

## 2023-01-31 NOTE — Group Note (Signed)
Date:  01/31/2023 Time:  1:46 PM  Group Topic/Focus:  Managing Feelings:   The focus of this group is to identify what feelings patients have difficulty handling and develop a plan to handle them in a healthier way upon discharge.    Participation Level:  Did Not Attend      Rodena Goldmann 01/31/2023, 1:46 PM

## 2023-01-31 NOTE — Group Note (Signed)
Recreation Therapy Group Note   Group Topic:General Recreation  Group Date: 01/31/2023 Start Time: 1400 End Time: 1455 Facilitators: Rosina Lowenstein, LRT, CTRS Location: Courtyard  Group Description: Outdoor Recreation. Patients had the option to play corn hole, ring toss, bowling or listening to music while outside in the courtyard getting fresh air and sunlight. Marland Kitchen LRT and patients discussed things that they enjoy doing in their free time outside of the hospital. LRT encouraged patients to drink water after being active and getting their heart rate up.   Goal Area(s) Addressed: Patient will identify leisure interests.  Patient will practice healthy decision making. Patient will engage in recreation activity.   Affect/Mood: N/A   Participation Level: Did not attend    Clinical Observations/Individualized Feedback: Betty Henderson did not attend group.   Plan: Continue to engage patient in RT group sessions 2-3x/week.   Rosina Lowenstein, LRT, CTRS 01/31/2023 3:10 PM

## 2023-01-31 NOTE — Progress Notes (Signed)
[facility] MD Progress Note  01/31/2023 12:46 PM Nguyen Emilye Furey  MRN:  [id number] Subjective: Trane is seen on rounds.  No changes.  No new information.  She denies any problems.  Nurses report no issues.  Still waiting for disposition. Principal Problem: Schizoaffective disorder, bipolar type (HCC) Diagnosis: Principal Problem:   Schizoaffective disorder, bipolar type (HCC)  Total Time spent with patient: 15 minutes  Past Psychiatric History: Schizoaffective disorder  Past Medical History:  Past Medical History:  Diagnosis Date   Anemia    Arthritis    Chronic pain    Drug-seeking behavior    Malingering    Osteopetrosis    Psychosis (HCC)    Schizoaffective disorder, bipolar type (HCC)     Past Surgical History:  Procedure Laterality Date   MOUTH SURGERY     TUBAL LIGATION     Family History:  Family History  Family history unknown: Yes   Family Psychiatric  History: Unremarkable Social History:  Social History   Substance and Sexual Activity  Alcohol Use Not Currently   Comment: 1 cocktail 3 weeks ago     Social History   Substance and Sexual Activity  Drug Use Not Currently   Types: Cocaine   Comment: states "it's legal though"    Social History   Socioeconomic History   Marital status: Single    Spouse name: Not on file   Number of children: Not on file   Years of education: Not on file   Highest education level: Not on file  Occupational History   Not on file  Tobacco Use   Smoking status: Every Day    Current packs/day: 0.50    Types: Cigarettes   Smokeless tobacco: Never  Vaping Use   Vaping status: Never Used  Substance and Sexual Activity   Alcohol use: Not Currently    Comment: 1 cocktail 3 weeks ago   Drug use: Not Currently    Types: Cocaine    Comment: states "it's legal though"   Sexual activity: Never  Other Topics Concern   Not on file  Social History Narrative   Not on file   Social Determinants of Health   Financial  Resource Strain: Not on file  Food Insecurity: Food Insecurity Present (08/22/2022)   Hunger Vital Sign    Worried About Running Out of Food in the Last Year: Sometimes true    Ran Out of Food in the Last Year: Sometimes true  Transportation Needs: Patient Unable To Answer (08/22/2022)   PRAPARE - Transportation    Lack of Transportation (Medical): Patient unable to answer    Lack of Transportation (Non-Medical): Patient unable to answer  Recent Concern: Transportation Needs - Unmet Transportation Needs (06/13/2022)   Received from [facility], [facility]   [facility] - Transportation    Lack of Transportation (Medical): Not on file    Lack of Transportation (Non-Medical): Yes  Physical Activity: Not on file  Stress: Not on file  Social Connections: Unknown (08/06/2021)   Received from [facility], [facility]   Social Network    Social Network: Not on file   Additional Social History:                         Sleep: Good  Appetite:  Good  Current Medications: Current Facility-Administered Medications  Medication Dose Route Frequency Provider Last Rate Last Admin   acetaminophen (TYLENOL) tablet 650 mg  650 mg Oral Q6H PRN Elane Fritz  Edward, DO   650 mg at 01/28/23 2012   alum & mag hydroxide-simeth (MAALOX/MYLANTA) 200-200-20 MG/5ML suspension 30 mL  30 mL Oral Q4H PRN [name], DO   30 mL at 01/30/23 2157   aspirin EC tablet 81 mg  81 mg Oral Daily [name], MD   81 mg at 01/31/23 0919   atorvastatin (LIPITOR) tablet 20 mg  20 mg Oral Daily [name], MD   20 mg at 01/31/23 0919   diphenhydrAMINE (BENADRYL) capsule 50 mg  50 mg Oral Q6H PRN [name], DO   50 mg at 09/29/22 2254   Or   diphenhydrAMINE (BENADRYL) injection 50 mg  50 mg Intramuscular Q6H PRN [name], DO       glimepiride (AMARYL) tablet 4 mg  4 mg Oral Q breakfast [name], MD   4 mg at 01/31/23 1610   haloperidol (HALDOL) tablet 5 mg   5 mg Oral Q6H PRN [name], DO   5 mg at 09/28/22 2149   Or   haloperidol lactate (HALDOL) injection 5 mg  5 mg Intramuscular Q6H PRN [name], DO       ibuprofen (ADVIL) tablet 600 mg  600 mg Oral Q6H PRN [name], [name], MD   600 mg at 01/29/23 0751   LORazepam (ATIVAN) tablet 1 mg  1 mg Oral TID PRN [name], DO   1 mg at 01/23/23 2133   magnesium hydroxide (MILK OF MAGNESIA) suspension 30 mL  30 mL Oral Daily PRN [name], DO   30 mL at 11/08/22 1012   metFORMIN (GLUCOPHAGE) tablet 500 mg  500 mg Oral Q breakfast [name], MD   500 mg at 01/31/23 0742   oxybutynin (DITROPAN) tablet 5 mg  5 mg Oral BID [name], MD   5 mg at 01/31/23 0920   paliperidone (INVEGA SUSTENNA) injection 156 mg  156 mg Intramuscular Q28 days [name], MD   156 mg at 01/15/23 1501   traZODone (DESYREL) tablet 50 mg  50 mg Oral QHS PRN [name], DO   50 mg at 01/30/23 2125    Lab Results:  Results for orders placed or performed during the hospital encounter of 08/22/22 (from the past 48 hour(s))  Glucose, random     Status: Abnormal   Collection Time: 01/30/23  7:01 AM  Result Value Ref Range   Glucose, Bld 263 (H) 70 - 99 mg/dL    Comment: Glucose reference range applies only to samples taken after fasting for at least 8 hours. Performed at [facility], [address] [place], [place] [address]     Blood Alcohol level:  Lab Results  Component Value Date   [facility] <10 08/17/2022   ETH <10 01/11/2021    Metabolic Disorder Labs: Lab Results  Component Value Date   HGBA1C 11.0 (H) 01/27/2023   MPG 269 01/27/2023   No results found for: "PROLACTIN" Lab Results  Component Value Date   CHOL 259 (H) 01/27/2023   TRIG 155 (H) 01/27/2023   HDL 65 01/27/2023   CHOLHDL 4.0 01/27/2023   VLDL 31 01/27/2023   LDLCALC 163 (H) 01/27/2023    Physical Findings: AIMS:  , ,  ,  ,    CIWA:    COWS:      Musculoskeletal: Strength & Muscle Tone: within normal limits Gait & Station: normal Patient leans: N/A  Psychiatric Specialty Exam:  Presentation  General Appearance:  Casual; Neat  Eye Contact: Fair  Speech: Slow; Slurred  Speech Volume: Decreased  Handedness: Right   Mood and Affect  Mood: Anxious  Affect: Inappropriate   Thought Process  Thought Processes: Disorganized  Descriptions of Associations:Loose  Orientation:Partial  Thought Content:Illogical; Rumination  History of Schizophrenia/Schizoaffective disorder:No data recorded Duration of Psychotic Symptoms:No data recorded Hallucinations:No data recorded Ideas of Reference:Paranoia  Suicidal Thoughts:No data recorded Homicidal Thoughts:No data recorded  Sensorium  Memory: Immediate Fair; Remote Poor  Judgment: Poor  Insight: Poor   Executive Functions  Concentration: Poor  Attention Span: Fair  Recall: [employer] of Knowledge: Fair  Language: Fair   Psychomotor Activity  Psychomotor Activity:No data recorded  Assets  Assets: Communication Skills   Sleep  Sleep:No data recorded    Blood pressure 127/82, pulse 90, temperature 98.2 F (36.8 C), resp. rate 18, height 5\' 6"  (1.676 m), weight (P) 82.8 kg, SpO2 100%. Body mass index is 29.46 kg/m (pended).   Treatment Plan Summary: Daily contact with patient to assess and evaluate symptoms and progress in treatment, Medication management, and Plan continue current medication.  [name], DO 01/31/2023, 12:46 PM

## 2023-01-31 NOTE — Progress Notes (Signed)
Patient admitted to [facility] for SAD / bipolar. Betty Henderson is calm, cooperative and friendly with staff and peers. Only complaint today was that her room is cold and she didn't want to get up from her bed stating it was too cold.  Put in a work order for [employer] to adjust the heat in her room.  Did skip her afternoon snack today but denies somatic complaints and was heard singing before I entered her room to check on her. Denies SI, HI, AVH, anxiety and depression.  Will continue to monitor.

## 2023-01-31 NOTE — Group Note (Unsigned)
Date:  01/31/2023 Time:  10:32 PM  Group Topic/Focus:  Wrap-Up Group:   The focus of this group is to help patients review their daily goal of treatment and discuss progress on daily workbooks.     Participation Level:  {BHH PARTICIPATION XBJYN:82956}  Participation Quality:  {BHH PARTICIPATION QUALITY:22265}  Affect:  {BHH AFFECT:22266}  Cognitive:  {BHH COGNITIVE:22267}  Insight: {BHH Insight2:20797}  Engagement in Group:  {BHH ENGAGEMENT IN OZHYQ:65784}  Modes of Intervention:  {BHH MODES OF INTERVENTION:22269}  Additional Comments:  ***  Lenore Cordia 01/31/2023, 10:32 PM

## 2023-01-31 NOTE — Progress Notes (Signed)
   01/31/23 0656  15 Minute Checks  Location Bedroom  Visual Appearance Calm  Behavior Sleeping  Sleep (Behavioral Health Patients Only)  Calculate sleep? (Click Yes once per 24 hr at 0600 safety check) Yes  Documented sleep last 24 hours 11

## 2023-02-01 DIAGNOSIS — F250 Schizoaffective disorder, bipolar type: Secondary | ICD-10-CM | POA: Diagnosis not present

## 2023-02-01 NOTE — Progress Notes (Signed)
 Patient with flat, sad affect.  Present in the dayroom for breakfast.  Reports poor sleep and returned to bed after breakfast.  Good appetite. Denies SI/HI and AVH.  Denies pain at this time.    Compliant with scheduled medications. 15 min checks in place for safety.  Patient isolates to room with exception of meals and occasional group. Minimal interaction with peers and staff.

## 2023-02-01 NOTE — Group Note (Signed)
Date:  02/01/2023 Time:  1:24 AM  Group Topic/Focus:  Goals Group:   The focus of this group is to help patients establish daily goals to achieve during treatment and discuss how the patient can incorporate goal setting into their daily lives to aide in recovery. Recovery Goals:   The focus of this group is to identify appropriate goals for recovery and establish a plan to achieve them. Relapse Prevention Planning:   The focus of this group is to define relapse and discuss the need for planning to combat relapse.    Participation Level:  Did Not Attend   Additional Comments:    [name] 02/01/2023, 1:24 AM

## 2023-02-01 NOTE — Group Note (Signed)
Date:  02/01/2023 Time:  6:22 PM  Group Topic/Focus:  Healthy coping skills    Participation Level:  Did Not Attend    Rodena Goldmann 02/01/2023, 6:22 PM

## 2023-02-01 NOTE — Plan of Care (Signed)
  Problem: Education: Goal: Knowledge of General Education information will improve Description: Including pain rating scale, medication(s)/side effects and non-pharmacologic comfort measures Outcome: Progressing   Problem: Pain Managment: Goal: General experience of comfort will improve Outcome: Progressing   Problem: Safety: Goal: Ability to remain free from injury will improve Outcome: Progressing   

## 2023-02-01 NOTE — Progress Notes (Signed)
[facility] MD Progress Note  02/01/2023 1:05 PM Betty Henderson  MRN:  [id number] Subjective: Betty Henderson is seen on rounds.  She has no complaints.  No issues.  Nothing new.  No disposition.  Tolerating medications.  Nurses report no issues.  She has been in good controls. Principal Problem: Schizoaffective disorder, bipolar type (HCC) Diagnosis: Principal Problem:   Schizoaffective disorder, bipolar type (HCC)  Total Time spent with patient: 15 minutes  Past Psychiatric History: Schizoaffective disorder  Past Medical History:  Past Medical History:  Diagnosis Date   Anemia    Arthritis    Chronic pain    Drug-seeking behavior    Malingering    Osteopetrosis    Psychosis (HCC)    Schizoaffective disorder, bipolar type (HCC)     Past Surgical History:  Procedure Laterality Date   MOUTH SURGERY     TUBAL LIGATION     Family History:  Family History  Family history unknown: Yes   Family Psychiatric  History: Unremarkable Social History:  Social History   Substance and Sexual Activity  Alcohol Use Not Currently   Comment: 1 cocktail 3 weeks ago     Social History   Substance and Sexual Activity  Drug Use Not Currently   Types: Cocaine   Comment: states "it's legal though"    Social History   Socioeconomic History   Marital status: Single    Spouse name: Not on file   Number of children: Not on file   Years of education: Not on file   Highest education level: Not on file  Occupational History   Not on file  Tobacco Use   Smoking status: Every Day    Current packs/day: 0.50    Types: Cigarettes   Smokeless tobacco: Never  Vaping Use   Vaping status: Never Used  Substance and Sexual Activity   Alcohol use: Not Currently    Comment: 1 cocktail 3 weeks ago   Drug use: Not Currently    Types: Cocaine    Comment: states "it's legal though"   Sexual activity: Never  Other Topics Concern   Not on file  Social History Narrative   Not on file   Social Determinants  of Health   Financial Resource Strain: Not on file  Food Insecurity: Food Insecurity Present (08/22/2022)   Hunger Vital Sign    Worried About Running Out of Food in the Last Year: Sometimes true    Ran Out of Food in the Last Year: Sometimes true  Transportation Needs: Patient Unable To Answer (08/22/2022)   PRAPARE - Transportation    Lack of Transportation (Medical): Patient unable to answer    Lack of Transportation (Non-Medical): Patient unable to answer  Recent Concern: Transportation Needs - Unmet Transportation Needs (06/13/2022)   Received from [facility], [facility]   [facility] - Transportation    Lack of Transportation (Medical): Not on file    Lack of Transportation (Non-Medical): Yes  Physical Activity: Not on file  Stress: Not on file  Social Connections: Unknown (08/06/2021)   Received from [facility], [facility]   Social Network    Social Network: Not on file   Additional Social History:                         Sleep: Good  Appetite:  Good  Current Medications: Current Facility-Administered Medications  Medication Dose Route Frequency Provider Last Rate Last Admin   acetaminophen (TYLENOL) tablet 650 mg  650 mg Oral Q6H PRN [name], DO   650 mg at 01/28/23 2012   alum & mag hydroxide-simeth (MAALOX/MYLANTA) 200-200-20 MG/5ML suspension 30 mL  30 mL Oral Q4H PRN [name], DO   30 mL at 01/30/23 2157   aspirin EC tablet 81 mg  81 mg Oral Daily [name], MD   81 mg at 02/01/23 0900   atorvastatin (LIPITOR) tablet 20 mg  20 mg Oral Daily [name], MD   20 mg at 02/01/23 0859   diphenhydrAMINE (BENADRYL) capsule 50 mg  50 mg Oral Q6H PRN [name], DO   50 mg at 09/29/22 2254   Or   diphenhydrAMINE (BENADRYL) injection 50 mg  50 mg Intramuscular Q6H PRN [name], DO       glimepiride (AMARYL) tablet 4 mg  4 mg Oral Q breakfast [name], MD   4 mg at 02/01/23 0900    haloperidol (HALDOL) tablet 5 mg  5 mg Oral Q6H PRN [name], DO   5 mg at 09/28/22 2149   Or   haloperidol lactate (HALDOL) injection 5 mg  5 mg Intramuscular Q6H PRN [name], DO       ibuprofen (ADVIL) tablet 600 mg  600 mg Oral Q6H PRN [name], [name], MD   600 mg at 01/29/23 0751   LORazepam (ATIVAN) tablet 1 mg  1 mg Oral TID PRN [name], DO   1 mg at 01/23/23 2133   magnesium hydroxide (MILK OF MAGNESIA) suspension 30 mL  30 mL Oral Daily PRN [name], DO   30 mL at 11/08/22 1012   metFORMIN (GLUCOPHAGE) tablet 500 mg  500 mg Oral Q breakfast [name], MD   500 mg at 02/01/23 0900   oxybutynin (DITROPAN) tablet 5 mg  5 mg Oral BID [name], MD   5 mg at 02/01/23 0900   paliperidone (INVEGA SUSTENNA) injection 156 mg  156 mg Intramuscular Q28 days [name], MD   156 mg at 01/15/23 1501   traZODone (DESYREL) tablet 50 mg  50 mg Oral QHS PRN [name], DO   50 mg at 01/31/23 2139    Lab Results: No results found for this or any previous visit (from the past 48 hour(s)).  Blood Alcohol level:  Lab Results  Component Value Date   ETH <10 08/17/2022   ETH <10 01/11/2021    Metabolic Disorder Labs: Lab Results  Component Value Date   HGBA1C 11.0 (H) 01/27/2023   MPG 269 01/27/2023   No results found for: "PROLACTIN" Lab Results  Component Value Date   CHOL 259 (H) 01/27/2023   TRIG 155 (H) 01/27/2023   HDL 65 01/27/2023   CHOLHDL 4.0 01/27/2023   VLDL 31 01/27/2023   LDLCALC 163 (H) 01/27/2023    Physical Findings: AIMS:  , ,  ,  ,    CIWA:    COWS:     Musculoskeletal: Strength & Muscle Tone: within normal limits Gait & Station: normal Patient leans: N/A  Psychiatric Specialty Exam:  Presentation  General Appearance:  Casual; Neat  Eye Contact: Fair  Speech: Slow; Slurred  Speech Volume: Decreased  Handedness: Right   Mood and Affect   Mood: Anxious  Affect: Inappropriate   Thought Process  Thought Processes: Disorganized  Descriptions of Associations:Loose  Orientation:Partial  Thought Content:Illogical; Rumination  History of Schizophrenia/Schizoaffective disorder:No data recorded Duration of Psychotic Symptoms:No data  recorded Hallucinations:No data recorded Ideas of Reference:Paranoia  Suicidal Thoughts:No data recorded Homicidal Thoughts:No data recorded  Sensorium  Memory: Immediate Fair; Remote Poor  Judgment: Poor  Insight: Poor   Executive Functions  Concentration: Poor  Attention Span: Fair  Recall: [employer] of Knowledge: Fair  Language: Fair   Psychomotor Activity  Psychomotor Activity:No data recorded  Assets  Assets: Communication Skills   Sleep  Sleep:No data recorded    Blood pressure (!) 114/90, pulse 99, temperature (!) 97.4 F (36.3 C), resp. rate 16, height 5\' 6"  (1.676 m), weight (P) 82.8 kg, SpO2 100%. Body mass index is 29.46 kg/m (pended).   Treatment Plan Summary: Daily contact with patient to assess and evaluate symptoms and progress in treatment, Medication management, and Plan continue current medications.  Tawn Fitzner Tresea Mall, DO 02/01/2023, 1:05 PM

## 2023-02-01 NOTE — Plan of Care (Signed)
Pt received in bed with eyes open. RR even and unlabored. States she's okay. Isolates in room except for meds/meals. Trazodone 50 mg po given PRN for insomnia. Tol well. Q 15 min checks maintained for safety. Denies SI, HI, AVH, and pain. Patient remains safe at this time.  Problem: Coping: Goal: Level of anxiety will decrease Outcome: Progressing   Problem: Elimination: Goal: Will not experience complications related to bowel motility Outcome: Progressing Goal: Will not experience complications related to urinary retention Outcome: Progressing   Problem: Pain Managment: Goal: General experience of comfort will improve Outcome: Progressing   Problem: Safety: Goal: Ability to remain free from injury will improve Outcome: Progressing

## 2023-02-01 NOTE — Group Note (Signed)
LCSW Group Therapy Note   Group Date: 02/01/2023 Start Time: 1300 End Time: 1340   Type of Therapy and Topic:  Group Therapy: Mindfullness Monitoring  Participation Level:  Did Not Attend    Summary of Patient Progress:  Patient did not attend    Therapeutic Modalities:  Cognitive Behavioral Therapy  Azucena Kuba, LCSWA 02/01/2023  4:16 PM

## 2023-02-02 DIAGNOSIS — F250 Schizoaffective disorder, bipolar type: Secondary | ICD-10-CM | POA: Diagnosis not present

## 2023-02-02 NOTE — Progress Notes (Signed)
2035-BP recheck by this writer manual reading 132/76-HR 74. Pt routine observations to continue to monitor pt safety.

## 2023-02-02 NOTE — BH IP Treatment Plan (Signed)
Interdisciplinary Treatment and Diagnostic Plan Update  02/02/2023 Time of Session: 3:42 pm Betty Henderson MRN: [id number]  Principal Diagnosis: Schizoaffective disorder, bipolar type [facility])  Secondary Diagnoses: Principal Problem:   Schizoaffective disorder, bipolar type (HCC)   Current Medications:  Current Facility-Administered Medications  Medication Dose Route Frequency Provider Last Rate Last Admin   acetaminophen (TYLENOL) tablet 650 mg  650 mg Oral Q6H PRN [name], DO   650 mg at 01/28/23 2012   alum & mag hydroxide-simeth (MAALOX/MYLANTA) 200-200-20 MG/5ML suspension 30 mL  30 mL Oral Q4H PRN [name], DO   30 mL at 01/30/23 2157   aspirin EC tablet 81 mg  81 mg Oral Daily [name], MD   81 mg at 02/02/23 6962   atorvastatin (LIPITOR) tablet 20 mg  20 mg Oral Daily [name], MD   20 mg at 02/02/23 9528   diphenhydrAMINE (BENADRYL) capsule 50 mg  50 mg Oral Q6H PRN [name], DO   50 mg at 09/29/22 2254   Or   diphenhydrAMINE (BENADRYL) injection 50 mg  50 mg Intramuscular Q6H PRN [name], DO       glimepiride (AMARYL) tablet 4 mg  4 mg Oral Q breakfast [name], MD   4 mg at 02/02/23 4132   haloperidol (HALDOL) tablet 5 mg  5 mg Oral Q6H PRN [name], DO   5 mg at 09/28/22 2149   Or   haloperidol lactate (HALDOL) injection 5 mg  5 mg Intramuscular Q6H PRN [name], DO       ibuprofen (ADVIL) tablet 600 mg  600 mg Oral Q6H PRN [name], [name], MD   600 mg at 01/29/23 0751   LORazepam (ATIVAN) tablet 1 mg  1 mg Oral TID PRN [name], DO   1 mg at 01/23/23 2133   magnesium hydroxide (MILK OF MAGNESIA) suspension 30 mL  30 mL Oral Daily PRN [name], DO   30 mL at 11/08/22 1012   metFORMIN (GLUCOPHAGE) tablet 500 mg  500 mg Oral Q breakfast [name], MD   500 mg at 02/02/23 0903   oxybutynin (DITROPAN) tablet 5 mg  5 mg Oral BID [name], MD   5 mg at 02/02/23 0904   paliperidone (INVEGA SUSTENNA) injection 156 mg  156 mg Intramuscular Q28 days [name], MD   156 mg at 01/15/23 1501   traZODone (DESYREL) tablet 50 mg  50 mg Oral QHS PRN [name], DO   50 mg at 02/01/23 2121   PTA Medications: Medications Prior to Admission  Medication Sig Dispense Refill Last Dose   ondansetron (ZOFRAN) 4 MG tablet Take 1 tablet (4 mg total) by mouth every 8 (eight) hours as needed for nausea or vomiting. (Patient not taking: Reported on 08/17/2022) 20 tablet 0     Patient Stressors: Financial difficulties   Health problems   Medication change or noncompliance    Patient Strengths: Ability for insight   Treatment Modalities: Medication Management, Group therapy, Case management,  1 to 1 session with clinician, Psychoeducation, Recreational therapy.   Physician Treatment Plan for Primary Diagnosis: Schizoaffective disorder, bipolar type (HCC) Long Term Goal(s): Improvement in symptoms so as ready for discharge   Short Term Goals: Ability to identify changes in lifestyle to reduce recurrence of condition will improve Ability to verbalize feelings will improve Ability to disclose and discuss suicidal ideas Ability to demonstrate self-control  will improve Ability to identify and develop effective coping behaviors will improve Ability to maintain clinical measurements within normal limits will improve Compliance with prescribed medications will improve Ability to identify triggers associated with substance abuse/mental health issues will improve  Medication Management: Evaluate patient's response, side effects, and tolerance of medication regimen.  Therapeutic Interventions: 1 to 1 sessions, Unit Group sessions and Medication administration.  Evaluation of Outcomes: Progressing  Physician Treatment Plan for Secondary Diagnosis: Principal Problem:   Schizoaffective disorder, bipolar type (HCC)  Long  Term Goal(s): Improvement in symptoms so as ready for discharge   Short Term Goals: Ability to identify changes in lifestyle to reduce recurrence of condition will improve Ability to verbalize feelings will improve Ability to disclose and discuss suicidal ideas Ability to demonstrate self-control will improve Ability to identify and develop effective coping behaviors will improve Ability to maintain clinical measurements within normal limits will improve Compliance with prescribed medications will improve Ability to identify triggers associated with substance abuse/mental health issues will improve     Medication Management: Evaluate patient's response, side effects, and tolerance of medication regimen.  Therapeutic Interventions: 1 to 1 sessions, Unit Group sessions and Medication administration.  Evaluation of Outcomes: Progressing   RN Treatment Plan for Primary Diagnosis: Schizoaffective disorder, bipolar type (HCC) Long Term Goal(s): Knowledge of disease and therapeutic regimen to maintain health will improve  Short Term Goals: Ability to remain free from injury will improve, Ability to verbalize frustration and anger appropriately will improve, Ability to demonstrate self-control, Ability to participate in decision making will improve, Ability to verbalize feelings will improve, Ability to disclose and discuss suicidal ideas, Ability to identify and develop effective coping behaviors will improve, and Compliance with prescribed medications will improve  Medication Management: RN will administer medications as ordered by provider, will assess and evaluate patient's response and provide education to patient for prescribed medication. RN will report any adverse and/or side effects to prescribing provider.  Therapeutic Interventions: 1 on 1 counseling sessions, Psychoeducation, Medication administration, Evaluate responses to treatment, Monitor vital signs and CBGs as ordered,  Perform/monitor CIWA, COWS, AIMS and Fall Risk screenings as ordered, Perform wound care treatments as ordered.  Evaluation of Outcomes: Progressing   LCSW Treatment Plan for Primary Diagnosis: Schizoaffective disorder, bipolar type (HCC) Long Term Goal(s): Safe transition to appropriate next level of care at discharge, Engage patient in therapeutic group addressing interpersonal concerns.  Short Term Goals: Engage patient in aftercare planning with referrals and resources, Increase social support, Increase ability to appropriately verbalize feelings, Increase emotional regulation, Facilitate acceptance of mental health diagnosis and concerns, and Increase skills for wellness and recovery  Therapeutic Interventions: Assess for all discharge needs, 1 to 1 time with Social worker, Explore available resources and support systems, Assess for adequacy in community support network, Educate family and significant other(s) on suicide prevention, Complete Psychosocial Assessment, Interpersonal group therapy.  Evaluation of Outcomes: Progressing   Progress in Treatment: Attending groups: No. Participating in groups: No. Taking medication as prescribed: Yes. Toleration medication: Yes. Family/Significant other contact made: Yes, individual(s) contacted:   Maysoon Swafford, son, [phone]  Patient understands diagnosis: Yes. Discussing patient identified problems/goals with staff: Yes. Medical problems stabilized or resolved: Yes. Denies suicidal/homicidal ideation: Yes. Issues/concerns per patient self-inventory: No. Other: none  New problem(s) identified: No, Describe:  none Updates 12/12/22: No changes made at this time Updates 12/17/22: No changes made at this time Update 12/24/22 No changes at this time. Update 12/29/2022: No changes at this time.  Update 01/03/23: No changes at this time Update 01/08/23: No changes at this time Update 01/13/23: No changes at this time Update 01/18/23: None at  this time. Update 01/23/2023:  No changes at this time. Update 01/28/23: Pt recently diagnosed with Diabetes Type II  Update 02/02/23: None at this time.   New Short Term/Long Term Goal(s): Update 6/30: none at this time. Update 09/27/2022:  No changes at this time.  Update 10/02/2022:  No changes at this time. Update 10/07/22: No changes at this time 10/12/22: No changes at this time Update 10/18/22: No changes at this time Update 10/23/22: No changes at this time Update 10/28/22: No changes at this time Update 11/07/22: No changes at this time Update 11/12/22: No changes at this time  Update 11/17/22: None at this time. Update 11/22/22: None at this time. Update 11/27/22 No changes at this time. Update 12/02/22 No changes at this time  Update 12/07/22: None at this time. Updates 12/12/22: No changes made at this time Updates 12/17/22: No changes made at this time Update 12/24/22 No changes at this time. Update 12/29/2022: No changes at this time. Update 01/03/23: No changes at this time Update 01/08/23: No changes at this time Update 01/13/23: No changes at this time   Update 01/18/23: No changes at this time. Update 01/23/2023:  No changes at this time. Update 01/28/2023:  No changes at this time. Update 02/02/23: None at this time.   Patient Goals:  Update 6/30: none at this time. Update 09/27/2022:  No changes at this time. Update 10/02/2022:  No changes at this time. Update 10/07/22: No changes at this time 10/12/22: No changes at this time Update 10/18/22: No changes at this time Update 10/23/22: No changes at this time Update 10/28/22: No changes at this time Update 11/07/22: No changes at this time Update 11/12/22: No changes at this time Update 11/17/22: None at this time. Update 11/22/22: None at this time. Update 11/27/22 No changes at this time Update 12/02/22 No changes at this time   Update 12/07/22: None at this time. Updates 12/12/22: No changes made at this time Updates 12/17/22: No changes made at this time Update 12/24/22 No changes  at this time. Update 12/29/2022: No changes at this time. Update 01/03/23: No changes at this time Update 01/08/23: No changes at this time Update 01/13/23: No changes at this time   Update 01/18/23: No changes at this time. Update 01/23/2023:  No changes at this time.  Update 01/28/2023:  No changes at this time. Update 02/02/23: None at this time.   Discharge Plan or Barriers: Update 6/30: APS report has been made and patient is being investigated for guardianship needs.  Remains homeless with limited supports. Update 09/27/2022:  No changes at this time.  Update 10/02/2022:  Patient remains safe on the unit at this time.  Patient remains psychotic at this time.  APS report has been made, however, no follow up from the caseworker on her case.  No safe discharge identified.  CSW has requested that application for Medicaid and disability be completed.   Update 10/07/22: No changes at this time 10/12/22: No changes at this time Update 10/18/22: No changes at this time Update 10/23/22: No changes at this time Update 10/23/22: No changes at this time Update 10/28/22: According to [facility], pt's caseworker at DSS, the petition was filed for guardianship on 10/21/22, now awaiting for the petition to be approvedUpdate 11/07/22: No changes at this time Update 11/12/22: CSW sent over patients  information to [facility] and [facility], awaiting a response. CSW awaiting word from DSS regarding pt's petition and what agency will become her guardian. Update 11/17/22: No changes at this time. Update 11/22/22: CSW has sent patients information to multiple facilities that were suggested by leadership, pt has been denied from the facility or the facility does not respond. CSW continues to send pt's information to nursing homes. Update 11/27/22 CSW contacted DSS to inquire about the status of guardianship. No response at this time Update 12/02/22 Supervisor Loraine Leriche contacted DSS who stated they are not taking pt on for guardianship but  that Quincy Carnes will be present tomorrow to do a visit with social worker and pt and that pt's son will be signing documentation for placement for the pt. Update 12/07/22: [facility] DSS has declined to take guardianship. Son, Casimiro Needle is to step into a more present role in deciding placement. A conversation is still needed. Updates 12/12/22: CSW staffed case with leadership and they state that a family meeting must happen with pt's son. CSW contacted pt's financial navigator and her DSS worker so that they may be present on the call and are able to speak to the efforts they have made to secure pt funding and housing. CSW waiting on responses from both, leadership is aware. Updates 12/17/22: CSW awaiting updates from both [facility] DSS and [facility] financial navigators as to status of pt's applications for Medicaid/[employer] and Disability, respectively. Update 12/24/22 CSW sent email regarding family meeting per request of supervisors. CSW awaiting responses from all parties to schedule family meeting. Update: 12/29/2022 No changes at this time. Update 01/03/23: CSW continues to await responses on the scheduling of the family meeting. CSW received call from Primitivo Gauze at [employer], CSW sent FL2 to [facility] to aide in pt's discharge planning. Update 01/08/23: CSW has reached out again to leadership and financial counseling. CSW continues to await a response. CSW called Sharlee Blew, awaiting a response for status of pt's placement options. Update 01/13/23: Pt is to meet with psychologist tomorrow 10/22 to complete capacity testing so that DSS can retry for guardianship  Update 01/18/23: The patient is still awaiting placement.  Update 01/23/2023:  Patient remains safe on the unit at this time.  CSW team continues to work on placement.  CSW team in contact with DSS to develop plan on guardianship.   Work continues to Hospital doctor. Update 01/28/2023:  Medicaid application remains pending. DSS  awaiting records from medical records to retry fo guardianship.  Update 02/02/23: None at this time.   Reason for Continuation of Hospitalization: Hallucinations Mania Medication stabilization   Estimated Length of Stay:  Update 6/30: 1-7 days Update 09/27/2022:  TBD Update 10/02/2022:  No changes at this time. UpdaUpdate 10/18/22: No changes at this time te 10/07/22: No changes at this time Update 10/18/22: No changes at this time Update 10/23/22: No changes at this time Update 10/28/22: No changes at this time Update 11/07/22: No changes at this time Update 11/12/22: No changes at this time  Update 11/17/22: No changes at this time. Update 11/22/22: None at this time. Update 11/27/22 No changes at this time. Update 12/02/22 No changes at this time  Update 12/07/22: None at this time. Updates 12/12/22: No changes made at this time Updates 12/17/22: TBD. Update 12/29/2022: No changes at this time. Update 01/03/23: TBDUpdate 01/08/23: TBD Update 01/13/23: TBD  Update 01/18/23: No changes at this time.  Update 01/23/2023:  TBD Update 01/28/2023:  TBD Update 02/02/23: None at this time.  Last 3 [place] Suicide Severity Risk Score: Flowsheet Row Admission (Current) from 08/22/2022 in [facility] BEH[facility]AL [facility]IC[facility] ED from 08/17/2022 in [facility] Department at [facility] ED from 08/12/2022 in [facility] Emergency Department at [facility]  C-SSRS RISK CATEGORY Low Risk No Risk No Risk       Last [facility] 2/9 Scores:     No data to display          Scribe for Treatment Team: Marshell Levan, [place] 02/02/2023 3:42 PM

## 2023-02-02 NOTE — Group Note (Signed)
Date:  02/02/2023 Time:  10:38 PM  Group Topic/Focus:  Making Healthy Choices:   The focus of this group is to help patients identify negative/unhealthy choices they were using prior to admission and identify positive/healthier coping strategies to replace them upon discharge.    Participation Level:  Did Not Attend  Participation Quality:      Affect:      Cognitive:      Insight: None  Engagement in Group:  None  Modes of Intervention:      Additional Comments:    [name] 02/02/2023, 10:38 PM

## 2023-02-02 NOTE — Plan of Care (Signed)
Pt received in bed with eyes open. RR even and unlabored. Pt remains stable. V/S WNL. Po medication given as scheduled. PRN for in insomnia given as ordered. Isolates in bed except form meds/meals. No behavior issues noted. Q 15 min checks maintained for safety. Denies SI/HI/AVH. No c/o pain/discomfort noted.   Problem: Coping: Goal: Level of anxiety will decrease Outcome: Progressing   Problem: Pain Managment: Goal: General experience of comfort will improve Outcome: Progressing

## 2023-02-02 NOTE — Progress Notes (Signed)
   02/02/23 0631  15 Minute Checks  Location Bedroom  Visual Appearance Calm  Behavior Sleeping  Sleep (Behavioral Health Patients Only)  Calculate sleep? (Click Yes once per 24 hr at 0600 safety check) Yes  Documented sleep last 24 hours 12.5

## 2023-02-02 NOTE — Progress Notes (Signed)
2015-Patient reported BP 99/47, HR 67. This Clinical research associate encouraged and assisted patient to ambulate to the dayroom for a snack. Patient denies any dizziness or SOB. Pt. Given Gatorade Zero. Vitals to be retaken, patient observations to continue to monitor safety.

## 2023-02-02 NOTE — Plan of Care (Signed)
  Problem: Nutrition: Goal: Adequate nutrition will be maintained Outcome: Progressing   Problem: Pain Managment: Goal: General experience of comfort will improve Outcome: Progressing   Problem: Safety: Goal: Ability to remain free from injury will improve Outcome: Progressing   

## 2023-02-02 NOTE — Progress Notes (Signed)
[facility] MD Progress Note  02/02/2023 12:26 PM Betty Henderson  MRN:  [id number] Subjective: Betty Henderson is seen on rounds.  No changes.  Nurses report no issues.  She denies any side effects.  She has no complaints. Principal Problem: Schizoaffective disorder, bipolar type (HCC) Diagnosis: Principal Problem:   Schizoaffective disorder, bipolar type (HCC)  Total Time spent with patient: 15 minutes  Past Psychiatric History: Schizoaffective disorder  Past Medical History:  Past Medical History:  Diagnosis Date   Anemia    Arthritis    Chronic pain    Drug-seeking behavior    Malingering    Osteopetrosis    Psychosis (HCC)    Schizoaffective disorder, bipolar type (HCC)     Past Surgical History:  Procedure Laterality Date   MOUTH SURGERY     TUBAL LIGATION     Family History:  Family History  Family history unknown: Yes   Family Psychiatric  History: Unremarkable Social History:  Social History   Substance and Sexual Activity  Alcohol Use Not Currently   Comment: 1 cocktail 3 weeks ago     Social History   Substance and Sexual Activity  Drug Use Not Currently   Types: Cocaine   Comment: states "it's legal though"    Social History   Socioeconomic History   Marital status: Single    Spouse name: Not on file   Number of children: Not on file   Years of education: Not on file   Highest education level: Not on file  Occupational History   Not on file  Tobacco Use   Smoking status: Every Day    Current packs/day: 0.50    Types: Cigarettes   Smokeless tobacco: Never  Vaping Use   Vaping status: Never Used  Substance and Sexual Activity   Alcohol use: Not Currently    Comment: 1 cocktail 3 weeks ago   Drug use: Not Currently    Types: Cocaine    Comment: states "it's legal though"   Sexual activity: Never  Other Topics Concern   Not on file  Social History Narrative   Not on file   Social Determinants of Health   Financial Resource Strain: Not on file   Food Insecurity: Food Insecurity Present (08/22/2022)   Hunger Vital Sign    Worried About Running Out of Food in the Last Year: Sometimes true    Ran Out of Food in the Last Year: Sometimes true  Transportation Needs: Patient Unable To Answer (08/22/2022)   PRAPARE - Transportation    Lack of Transportation (Medical): Patient unable to answer    Lack of Transportation (Non-Medical): Patient unable to answer  Recent Concern: Transportation Needs - Unmet Transportation Needs (06/13/2022)   Received from [facility], [facility]   [facility] - Transportation    Lack of Transportation (Medical): Not on file    Lack of Transportation (Non-Medical): Yes  Physical Activity: Not on file  Stress: Not on file  Social Connections: Unknown (08/06/2021)   Received from [facility], [facility]   Social Network    Social Network: Not on file   Additional Social History:                         Sleep: Good  Appetite:  Good  Current Medications: Current Facility-Administered Medications  Medication Dose Route Frequency Provider Last Rate Last Admin   acetaminophen (TYLENOL) tablet 650 mg  650 mg Oral Q6H PRN [name], DO  650 mg at 01/28/23 2012   alum & mag hydroxide-simeth (MAALOX/MYLANTA) 200-200-20 MG/5ML suspension 30 mL  30 mL Oral Q4H PRN [name], DO   30 mL at 01/30/23 2157   aspirin EC tablet 81 mg  81 mg Oral Daily [name], MD   81 mg at 02/02/23 1610   atorvastatin (LIPITOR) tablet 20 mg  20 mg Oral Daily [name], MD   20 mg at 02/02/23 9604   diphenhydrAMINE (BENADRYL) capsule 50 mg  50 mg Oral Q6H PRN [name], DO   50 mg at 09/29/22 2254   Or   diphenhydrAMINE (BENADRYL) injection 50 mg  50 mg Intramuscular Q6H PRN [name], DO       glimepiride (AMARYL) tablet 4 mg  4 mg Oral Q breakfast [name], MD   4 mg at 02/02/23 5409   haloperidol (HALDOL) tablet 5 mg  5 mg Oral Q6H PRN [name], DO   5 mg at 09/28/22 2149   Or   haloperidol lactate (HALDOL) injection 5 mg  5 mg Intramuscular Q6H PRN [name], DO       ibuprofen (ADVIL) tablet 600 mg  600 mg Oral Q6H PRN [name], [name], MD   600 mg at 01/29/23 0751   LORazepam (ATIVAN) tablet 1 mg  1 mg Oral TID PRN [name], DO   1 mg at 01/23/23 2133   magnesium hydroxide (MILK OF MAGNESIA) suspension 30 mL  30 mL Oral Daily PRN [name], DO   30 mL at 11/08/22 1012   metFORMIN (GLUCOPHAGE) tablet 500 mg  500 mg Oral Q breakfast [name], MD   500 mg at 02/02/23 0903   oxybutynin (DITROPAN) tablet 5 mg  5 mg Oral BID [name], MD   5 mg at 02/02/23 0904   paliperidone (INVEGA SUSTENNA) injection 156 mg  156 mg Intramuscular Q28 days [name], MD   156 mg at 01/15/23 1501   traZODone (DESYREL) tablet 50 mg  50 mg Oral QHS PRN [name], DO   50 mg at 02/01/23 2121    Lab Results: No results found for this or any previous visit (from the past 48 hour(s)).  Blood Alcohol level:  Lab Results  Component Value Date   ETH <10 08/17/2022   ETH <10 01/11/2021    Metabolic Disorder Labs: Lab Results  Component Value Date   HGBA1C 11.0 (H) 01/27/2023   MPG 269 01/27/2023   No results found for: "PROLACTIN" Lab Results  Component Value Date   CHOL 259 (H) 01/27/2023   TRIG 155 (H) 01/27/2023   HDL 65 01/27/2023   CHOLHDL 4.0 01/27/2023   VLDL 31 01/27/2023   LDLCALC 163 (H) 01/27/2023    Physical Findings: AIMS:  , ,  ,  ,    CIWA:    COWS:     Musculoskeletal: Strength & Muscle Tone: within normal limits Gait & Station: normal Patient leans: N/A  Psychiatric Specialty Exam:  Presentation  General Appearance:  Casual; Neat  Eye Contact: Fair  Speech: Slow; Slurred  Speech Volume: Decreased  Handedness: Right   Mood and Affect  Mood: Anxious  Affect: Inappropriate   Thought Process  Thought  Processes: Disorganized  Descriptions of Associations:Loose  Orientation:Partial  Thought Content:Illogical; Rumination  History of Schizophrenia/Schizoaffective disorder:No data recorded Duration of Psychotic Symptoms:No data recorded Hallucinations:No data recorded Ideas of Reference:Paranoia  Suicidal Thoughts:No data  recorded Homicidal Thoughts:No data recorded  Sensorium  Memory: Immediate Fair; Remote Poor  Judgment: Poor  Insight: Poor   Executive Functions  Concentration: Poor  Attention Span: Fair  Recall: [employer] of Knowledge: Fair  Language: Fair   Psychomotor Activity  Psychomotor Activity:No data recorded  Assets  Assets: Communication Skills   Sleep  Sleep:No data recorded    Blood pressure 116/78, pulse 92, temperature 98.1 F (36.7 C), resp. rate 17, height 5\' 6"  (1.676 m), weight (P) 82.8 kg, SpO2 100%. Body mass index is 29.46 kg/m (pended).   Treatment Plan Summary: Daily contact with patient to assess and evaluate symptoms and progress in treatment, Medication management, and Plan continue current medications.  [name], DO 02/02/2023, 12:26 PM

## 2023-02-02 NOTE — Progress Notes (Signed)
 Patient calm and cooperative.  Flat, sad affect.  Reports she slept well.  Denies SI/HI and AVH.  Denies pain.  Speech is soft and mumbled. Patient heard speaking to herself in room.   Compliant with scheduled medications. 15 min checks in place for safety.  Isolates to room with the exception of meals.  Minimal interaction with peers and staff when in milieu.

## 2023-02-02 NOTE — Group Note (Signed)
Date:  02/02/2023 Time:  10:52 AM  Group Topic/Focus:  Movement Therapy    Participation Level:  Did Not Attend     Rodena Goldmann 02/02/2023, 10:52 AM

## 2023-02-03 DIAGNOSIS — F250 Schizoaffective disorder, bipolar type: Secondary | ICD-10-CM | POA: Diagnosis not present

## 2023-02-03 NOTE — Plan of Care (Signed)
  Problem: Education: Goal: Knowledge of General Education information will improve Description: Including pain rating scale, medication(s)/side effects and non-pharmacologic comfort measures Outcome: Progressing   Problem: Health Behavior/Discharge Planning: Goal: Ability to manage health-related needs will improve Outcome: Progressing   Problem: Clinical Measurements: Goal: Ability to maintain clinical measurements within normal limits will improve Outcome: Progressing Goal: Diagnostic test results will improve Outcome: Progressing   Problem: Nutrition: Goal: Adequate nutrition will be maintained Outcome: Progressing   Problem: Coping: Goal: Level of anxiety will decrease Outcome: Progressing   Problem: Elimination: Goal: Will not experience complications related to bowel motility Outcome: Progressing Goal: Will not experience complications related to urinary retention Outcome: Progressing   Problem: Pain Managment: Goal: General experience of comfort will improve Outcome: Progressing   Problem: Safety: Goal: Ability to remain free from injury will improve Outcome: Progressing   Problem: Skin Integrity: Goal: Risk for impaired skin integrity will decrease Outcome: Progressing

## 2023-02-03 NOTE — Group Note (Signed)
Date:  02/03/2023 Time:  10:45 PM  Group Topic/Focus:  Wellness Toolbox:   The focus of this group is to discuss various aspects of wellness, balancing those aspects and exploring ways to increase the ability to experience wellness.  Patients will create a wellness toolbox for use upon discharge.    Participation Level:  Active  Participation Quality:  Appropriate  Affect:  Appropriate  Cognitive:  Alert  Insight: Good  Engagement in Group:  Engaged  Modes of Intervention:  Discussion  Additional Comments:    [name] 02/03/2023, 10:45 PM

## 2023-02-03 NOTE — Progress Notes (Signed)
Patient admitted IVC on Aug 22, 2022 for worsening psychosis. She is now a voluntary admssion.   She denies SI/HI/AVH. At times patient will respond to internal stimuli but no unsafe behaviors noted. Patient is medication adherent and swallows the pills without difficulty. Patient prefers to isolate to her room but will appear for meals and snacks. Support and encouragement given.  Q15 minute unit checks in place.

## 2023-02-03 NOTE — Progress Notes (Signed)
   02/03/23 0630  15 Minute Checks  Location Bedroom  Visual Appearance Calm  Behavior Sleeping  Sleep (Behavioral Health Patients Only)  Calculate sleep? (Click Yes once per 24 hr at 0600 safety check) Yes  Documented sleep last 24 hours 16

## 2023-02-03 NOTE — Group Note (Signed)
Date:  02/03/2023 Time:  11:35 AM  Group Topic/Focus:  Emotional Education:   The focus of this group is to discuss what feelings/emotions are, and how they are experienced.    Participation Level:  Did Not Attend   Ardelle Anton 02/03/2023, 11:35 AM

## 2023-02-03 NOTE — Plan of Care (Signed)
  Problem: Education: Goal: Knowledge of General Education information will improve Description: Including pain rating scale, medication(s)/side effects and non-pharmacologic comfort measures Outcome: Progressing   Problem: Health Behavior/Discharge Planning: Goal: Ability to manage health-related needs will improve Outcome: Progressing   Problem: Clinical Measurements: Goal: Ability to maintain clinical measurements within normal limits will improve Outcome: Not Progressing   

## 2023-02-03 NOTE — Inpatient Diabetes Management (Signed)
Inpatient Diabetes Program Recommendations  AACE/ADA: New Consensus Statement on Inpatient Glycemic Control (2015)  Target Ranges:  Prepandial:   less than 140 mg/dL      Peak postprandial:   less than 180 mg/dL (1-2 hours)      Critically ill patients:  140 - 180 mg/dL    Diabetes history: New Onset DM2   Current orders: Amaryl 4 mg daily, Metformin 500 mg daily         MD- Please consider adding orders for CBG checks TID AC to check to see if the Amaryl and Metformin medications need any adjustment prior to d/c       --Will follow patient during hospitalization--  Ambrose Finland RN, MSN, CDCES Diabetes Coordinator Inpatient Glycemic Control Team Team Pager: [phone] (8a-5p)

## 2023-02-03 NOTE — Progress Notes (Signed)
[employer] MD Progress Note  02/03/2023 11:31 AM Betty Henderson  MRN:  [id number] Subjective: Betty Henderson is seen on rounds.  She is pleasant and cooperative.  No issues.  She has no complaints.  Social work asks about writing a letter that she can take care of herself in a shelter and I told social work that she probably cannot do that.  She has the capacity to take care of herself under supervision but not left alone on her own.  Especially with her history of substance abuse. Principal Problem: Schizoaffective disorder, bipolar type (HCC) Diagnosis: Principal Problem:   Schizoaffective disorder, bipolar type (HCC)  Total Time spent with patient: 15 minutes  Past Psychiatric History: Schizoaffective disorder, bipolar type  Past Medical History:  Past Medical History:  Diagnosis Date   Anemia    Arthritis    Chronic pain    Drug-seeking behavior    Malingering    Osteopetrosis    Psychosis (HCC)    Schizoaffective disorder, bipolar type (HCC)     Past Surgical History:  Procedure Laterality Date   MOUTH SURGERY     TUBAL LIGATION     Family History:  Family History  Family history unknown: Yes   Family Psychiatric  History: Unremarkable Social History:  Social History   Substance and Sexual Activity  Alcohol Use Not Currently   Comment: 1 cocktail 3 weeks ago     Social History   Substance and Sexual Activity  Drug Use Not Currently   Types: Cocaine   Comment: states "it's legal though"    Social History   Socioeconomic History   Marital status: Single    Spouse name: Not on file   Number of children: Not on file   Years of education: Not on file   Highest education level: Not on file  Occupational History   Not on file  Tobacco Use   Smoking status: Every Day    Current packs/day: 0.50    Types: Cigarettes   Smokeless tobacco: Never  Vaping Use   Vaping status: Never Used  Substance and Sexual Activity   Alcohol use: Not Currently    Comment: 1 cocktail 3  weeks ago   Drug use: Not Currently    Types: Cocaine    Comment: states "it's legal though"   Sexual activity: Never  Other Topics Concern   Not on file  Social History Narrative   Not on file   Social Determinants of Health   Financial Resource Strain: Not on file  Food Insecurity: Food Insecurity Present (08/22/2022)   Hunger Vital Sign    Worried About Running Out of Food in the Last Year: Sometimes true    Ran Out of Food in the Last Year: Sometimes true  Transportation Needs: Patient Unable To Answer (08/22/2022)   PRAPARE - Transportation    Lack of Transportation (Medical): Patient unable to answer    Lack of Transportation (Non-Medical): Patient unable to answer  Recent Concern: Transportation Needs - Unmet Transportation Needs (06/13/2022)   Received from [facility], [facility]   [facility] - Transportation    Lack of Transportation (Medical): Not on file    Lack of Transportation (Non-Medical): Yes  Physical Activity: Not on file  Stress: Not on file  Social Connections: Unknown (08/06/2021)   Received from [facility], [facility]   Social Network    Social Network: Not on file   Additional Social History:  Sleep: Good  Appetite:  Good  Current Medications: Current Facility-Administered Medications  Medication Dose Route Frequency Provider Last Rate Last Admin   acetaminophen (TYLENOL) tablet 650 mg  650 mg Oral Q6H PRN [name], DO   650 mg at 02/02/23 2029   alum & mag hydroxide-simeth (MAALOX/MYLANTA) 200-200-20 MG/5ML suspension 30 mL  30 mL Oral Q4H PRN [name], DO   30 mL at 01/30/23 2157   aspirin EC tablet 81 mg  81 mg Oral Daily [name], MD   81 mg at 02/03/23 0920   atorvastatin (LIPITOR) tablet 20 mg  20 mg Oral Daily [name], MD   20 mg at 02/03/23 0920   diphenhydrAMINE (BENADRYL) capsule 50 mg  50 mg Oral Q6H PRN [name], DO   50 mg at 09/29/22 2254    Or   diphenhydrAMINE (BENADRYL) injection 50 mg  50 mg Intramuscular Q6H PRN [name], DO       glimepiride (AMARYL) tablet 4 mg  4 mg Oral Q breakfast [name], MD   4 mg at 02/03/23 7829   haloperidol (HALDOL) tablet 5 mg  5 mg Oral Q6H PRN [name], DO   5 mg at 09/28/22 2149   Or   haloperidol lactate (HALDOL) injection 5 mg  5 mg Intramuscular Q6H PRN [name], DO       ibuprofen (ADVIL) tablet 600 mg  600 mg Oral Q6H PRN [name], [name], MD   600 mg at 01/29/23 0751   LORazepam (ATIVAN) tablet 1 mg  1 mg Oral TID PRN [name], DO   1 mg at 01/23/23 2133   magnesium hydroxide (MILK OF MAGNESIA) suspension 30 mL  30 mL Oral Daily PRN [name], DO   30 mL at 11/08/22 1012   metFORMIN (GLUCOPHAGE) tablet 500 mg  500 mg Oral Q breakfast [name], MD   500 mg at 02/03/23 0825   oxybutynin (DITROPAN) tablet 5 mg  5 mg Oral BID [name], MD   5 mg at 02/03/23 0920   paliperidone (INVEGA SUSTENNA) injection 156 mg  156 mg Intramuscular Q28 days [name], MD   156 mg at 01/15/23 1501   traZODone (DESYREL) tablet 50 mg  50 mg Oral QHS PRN [name], DO   50 mg at 02/02/23 2028    Lab Results: No results found for this or any previous visit (from the past 48 hour(s)).  Blood Alcohol level:  Lab Results  Component Value Date   ETH <10 08/17/2022   ETH <10 01/11/2021    Metabolic Disorder Labs: Lab Results  Component Value Date   HGBA1C 11.0 (H) 01/27/2023   MPG 269 01/27/2023   No results found for: "PROLACTIN" Lab Results  Component Value Date   CHOL 259 (H) 01/27/2023   TRIG 155 (H) 01/27/2023   HDL 65 01/27/2023   CHOLHDL 4.0 01/27/2023   VLDL 31 01/27/2023   LDLCALC 163 (H) 01/27/2023    Physical Findings: AIMS:  , ,  ,  ,    CIWA:    COWS:     Musculoskeletal: Strength & Muscle Tone: within normal limits Gait & Station: normal Patient leans:  N/A  Psychiatric Specialty Exam:  Presentation  General Appearance:  Casual; Neat  Eye Contact: Fair  Speech: Slow; Slurred  Speech Volume: Decreased  Handedness: Right   Mood and Affect  Mood: Anxious  Affect: Inappropriate  Thought Process  Thought Processes: Disorganized  Descriptions of Associations:Loose  Orientation:Partial  Thought Content:Illogical; Rumination  History of Schizophrenia/Schizoaffective disorder:No data recorded Duration of Psychotic Symptoms:No data recorded Hallucinations:No data recorded Ideas of Reference:Paranoia  Suicidal Thoughts:No data recorded Homicidal Thoughts:No data recorded  Sensorium  Memory: Immediate Fair; Remote Poor  Judgment: Poor  Insight: Poor   Executive Functions  Concentration: Poor  Attention Span: Fair  Recall: [employer] of Knowledge: Fair  Language: Fair   Psychomotor Activity  Psychomotor Activity:No data recorded  Assets  Assets: Communication Skills   Sleep  Sleep:No data recorded   MENTAL STATUS EXAM: Patient is alert and oriented x 3, pleasant and cooperative, good eye contact, speech is normal and not pressured, mood is depressed; affect is flat; thought process: goal directed; thought content: Denies suicidal ideation; judgment is fair, insight is fair. Blood pressure 120/73, pulse 65, temperature 97.9 F (36.6 C), resp. rate 18, height 5\' 6"  (1.676 m), weight (P) 82.8 kg, SpO2 98%. Body mass index is 29.46 kg/m (pended).   Treatment Plan Summary: Daily contact with patient to assess and evaluate symptoms and progress in treatment, Medication management, and Plan continue current medication.  [name], DO 02/03/2023, 11:31 AM

## 2023-02-04 DIAGNOSIS — F250 Schizoaffective disorder, bipolar type: Secondary | ICD-10-CM | POA: Diagnosis not present

## 2023-02-04 NOTE — BHH Counselor (Signed)
CSW had conversations with both Dr.Parmar and Dr.Herrick regarding CSW's leadership suggestion that pt can be discharged to homeless shelter.   Both Dr.Parmar and Dr.Herrick report that pt is unable to care for herself and manage her own medications if she would be discharged to a shelter. Both providers are unwilling to write a note supporting releasing pt to a shelter.   CSW will inform [facility] leadership of the provider's decision.    Reynaldo Minium, MSW, [place] 02/04/2023 3:53 PM

## 2023-02-04 NOTE — Plan of Care (Signed)
  Problem: Education: Goal: Knowledge of General Education information will improve Description: Including pain rating scale, medication(s)/side effects and non-pharmacologic comfort measures Outcome: Progressing   Problem: Health Behavior/Discharge Planning: Goal: Ability to manage health-related needs will improve Outcome: Progressing   Problem: Clinical Measurements: Goal: Ability to maintain clinical measurements within normal limits will improve Outcome: Progressing Goal: Diagnostic test results will improve Outcome: Progressing   Problem: Nutrition: Goal: Adequate nutrition will be maintained Outcome: Progressing   Problem: Coping: Goal: Level of anxiety will decrease Outcome: Progressing   Problem: Elimination: Goal: Will not experience complications related to bowel motility Outcome: Progressing Goal: Will not experience complications related to urinary retention Outcome: Progressing   Problem: Pain Managment: Goal: General experience of comfort will improve Outcome: Progressing   Problem: Safety: Goal: Ability to remain free from injury will improve Outcome: Progressing   Problem: Skin Integrity: Goal: Risk for impaired skin integrity will decrease Outcome: Progressing

## 2023-02-04 NOTE — Group Note (Signed)
LCSW Group Therapy Note  Group Date: 02/04/2023 Start Time: 1330 End Time: 1415   Type of Therapy and Topic:  Group Therapy - Healthy vs Unhealthy Coping Skills  Participation Level:  Did Not Attend   Description of Group The focus of this group was to determine what unhealthy coping techniques typically are used by group members and what healthy coping techniques would be helpful in coping with various problems. Patients were guided in becoming aware of the differences between healthy and unhealthy coping techniques. Patients were asked to identify 2-3 healthy coping skills they would like to learn to use more effectively.  Therapeutic Goals Patients learned that coping is what human beings do all day long to deal with various situations in their lives Patients defined and discussed healthy vs unhealthy coping techniques Patients identified their preferred coping techniques and identified whether these were healthy or unhealthy Patients determined 2-3 healthy coping skills they would like to become more familiar with and use more often. Patients provided support and ideas to each other   Summary of Patient Progress:  X  Therapeutic Modalities Cognitive Behavioral Therapy Motivational Interviewing  Betty Henderson, [place] 02/04/2023  2:31 PM

## 2023-02-04 NOTE — Progress Notes (Signed)
[facility] MD Progress Note  Betty Henderson  MRN:  [id number]  Subjective: Case discussed in multidisciplinary meeting today, chart reviewed, patient seen today during rounds. Social worker is working with DSS on placement.  Patient reports she is doing "good" today. No new acute issues overnight reported by the staff. Patient denies any acute events overnight.  Patient has been attending groups and participating in milieu.  She is interacting well with her peers.  Patient denies auditory or visual hallucinations.      Principal Problem: Schizoaffective disorder, bipolar type (HCC) Diagnosis: Principal Problem:   Schizoaffective disorder, bipolar type (HCC)   Past Psychiatric History: Schizoaffective disorder, bipolar type.  Past Medical History:  Past Medical History:  Diagnosis Date   Anemia    Arthritis    Chronic pain    Drug-seeking behavior    Malingering    Osteopetrosis    Psychosis (HCC)    Schizoaffective disorder, bipolar type (HCC)     Past Surgical History:  Procedure Laterality Date   MOUTH SURGERY     TUBAL LIGATION     Family History:  Family History  Family history unknown: Yes   Family Psychiatric  History: Unremarkable Social History:  Social History   Substance and Sexual Activity  Alcohol Use Not Currently   Comment: 1 cocktail 3 weeks ago     Social History   Substance and Sexual Activity  Drug Use Not Currently   Types: Cocaine   Comment: states "it's legal though"    Social History   Socioeconomic History   Marital status: Single    Spouse name: Not on file   Number of children: Not on file   Years of education: Not on file   Highest education level: Not on file  Occupational History   Not on file  Tobacco Use   Smoking status: Every Day    Current packs/day: 0.50    Types: Cigarettes   Smokeless tobacco: Never  Vaping Use   Vaping status: Never Used  Substance and Sexual Activity   Alcohol use: Not Currently    Comment: 1  cocktail 3 weeks ago   Drug use: Not Currently    Types: Cocaine    Comment: states "it's legal though"   Sexual activity: Never  Other Topics Concern   Not on file  Social History Narrative   Not on file   Social Determinants of Health   Financial Resource Strain: Not on file  Food Insecurity: Food Insecurity Present (08/22/2022)   Hunger Vital Sign    Worried About Running Out of Food in the Last Year: Sometimes true    Ran Out of Food in the Last Year: Sometimes true  Transportation Needs: Patient Unable To Answer (08/22/2022)   PRAPARE - Transportation    Lack of Transportation (Medical): Patient unable to answer    Lack of Transportation (Non-Medical): Patient unable to answer  Recent Concern: Transportation Needs - Unmet Transportation Needs (06/13/2022)   Received from [facility], [facility]   [facility] - Transportation    Lack of Transportation (Medical): Not on file    Lack of Transportation (Non-Medical): Yes  Physical Activity: Not on file  Stress: Not on file  Social Connections: Unknown (08/06/2021)   Received from [facility], [facility]   Social Network    Social Network: Not on file   Additional Social History:     Patient is homeless at this time.  Social worker is working with DSS on [employer] application and  placement.                    Sleep Good  Appetite:  Good  Current Medications: Current Facility-Administered Medications  Medication Dose Route Frequency Provider Last Rate Last Admin   acetaminophen (TYLENOL) tablet 650 mg  650 mg Oral Q6H PRN [name], DO   650 mg at 02/03/23 2117   alum & mag hydroxide-simeth (MAALOX/MYLANTA) 200-200-20 MG/5ML suspension 30 mL  30 mL Oral Q4H PRN [name], DO   30 mL at 01/30/23 2157   aspirin EC tablet 81 mg  81 mg Oral Daily [name], MD   81 mg at 02/04/23 0850   atorvastatin (LIPITOR) tablet 20 mg  20 mg Oral Daily [name], MD   20 mg at 02/04/23 0850    diphenhydrAMINE (BENADRYL) capsule 50 mg  50 mg Oral Q6H PRN [name], DO   50 mg at 09/29/22 2254   Or   diphenhydrAMINE (BENADRYL) injection 50 mg  50 mg Intramuscular Q6H PRN [name], DO       glimepiride (AMARYL) tablet 4 mg  4 mg Oral Q breakfast [name], MD   4 mg at 02/04/23 0850   haloperidol (HALDOL) tablet 5 mg  5 mg Oral Q6H PRN [name], DO   5 mg at 09/28/22 2149   Or   haloperidol lactate (HALDOL) injection 5 mg  5 mg Intramuscular Q6H PRN [name], DO       ibuprofen (ADVIL) tablet 600 mg  600 mg Oral Q6H PRN [name], [name], MD   600 mg at 01/29/23 0751   LORazepam (ATIVAN) tablet 1 mg  1 mg Oral TID PRN [name], DO   1 mg at 01/23/23 2133   magnesium hydroxide (MILK OF MAGNESIA) suspension 30 mL  30 mL Oral Daily PRN [name], DO   30 mL at 11/08/22 1012   metFORMIN (GLUCOPHAGE) tablet 500 mg  500 mg Oral Q breakfast [name], MD   500 mg at 02/04/23 0850   oxybutynin (DITROPAN) tablet 5 mg  5 mg Oral BID [name], MD   5 mg at 02/04/23 0850   paliperidone (INVEGA SUSTENNA) injection 156 mg  156 mg Intramuscular Q28 days [name], MD   156 mg at 01/15/23 1501   traZODone (DESYREL) tablet 50 mg  50 mg Oral QHS PRN [name], DO   50 mg at 02/03/23 2117    Lab Results:  No results found for this or any previous visit (from the past 48 hour(s)).   Blood Alcohol level:  Lab Results  Component Value Date   ETH <10 08/17/2022   ETH <10 01/11/2021      Musculoskeletal: Strength & Muscle Tone: within normal limits Gait & Station: normal Patient leans: N/A   Psychiatric Specialty Exam:   Presentation  General Appearance:  Casual; Neat   Eye Contact: Fair   Speech: Spontaneous   Speech Volume: Normal   Handedness: Right     Mood and Affect  Mood: "good"   Affect: Stable and pleasant    Thought  Processes: Improved, at baseline   Descriptions of Associations: Intact   Orientation: Well-oriented   Thought Content: Improved, at baseline  Hallucinations:Denies, occasionally heard talking to self in her room  Ideas of Reference:None noted   Suicidal Thoughts:Denies SI, no intention or plan  Homicidal Thoughts:Denies HI, no intention or plan  Sensorium  Memory: Immediate Fair; Remote Poor   Judgment: Fair, pt is Rx compliant   Insight: Improved     Executive Functions  Concentration: Improved   Attention Span: Improved   Language: Fair     Psychomotor Activity  Psychomotor Activity: Normal  Assets  Assets: Communication Skills     Sleep  Sleep:Improved     Physical Exam: Physical Exam Vitals and nursing note reviewed.  Constitutional:      Appearance: Normal appearance. She is normal weight.  Neurological:     General: No focal deficit present.     Mental Status: She is alert.      Review of Systems  Constitutional: Negative.   HENT: Negative.    Eyes: Negative.   Respiratory: Negative.    Cardiovascular: Negative.   Gastrointestinal: Negative.   Genitourinary: Negative.   Musculoskeletal: Negative.   Skin: Negative.   Neurological: Negative.   Endo/Heme/Allergies: Negative.     Blood pressure (!) 93/47, pulse (!) 59, temperature 97.7 F (36.5 C), resp. rate 16, height 5\' 6"  (1.676 m), weight (P) 82.8 kg, SpO2 96%. Body mass index is 29.46 kg/m (pended).   Treatment Plan Summary: Daily contact with patient to assess and evaluate symptoms and progress in treatment, Medication management, and Plan continue current medications.   Continue to monitor patient on as needed meds   Patient received  dose of Invega Sustenna 156 mg IM on  01/15/23 Oxybutynin 5 mg by mouth twice daily to help with urinary incontinence Continue on Metformin and Amaryl.   Continue on atorvastatin for Mixed hyperlipidemia   Patient is awaiting  placement    [name], MD

## 2023-02-04 NOTE — Progress Notes (Signed)
   02/04/23 1128  Psych Admission Type (Psych Patients Only)  Admission Status Voluntary  Psychosocial Assessment  Patient Complaints None  Eye Contact Brief  Facial Expression Flat  Affect Flat  Speech Slurred  Interaction Minimal;Isolative  Motor Activity Shuffling  Appearance/Hygiene Layered clothes  Behavior Characteristics Cooperative  Mood Sad  Thought Process  Coherency WDL  Content WDL  Delusions None reported or observed  Perception Hallucinations  Hallucination Auditory  Judgment Impaired  Confusion None  Danger to Self  Current suicidal ideation? Denies  Agreement Not to Harm Self Yes  Danger to Others  Danger to Others None reported or observed  Danger to Others Abnormal  Harmful Behavior to others No threats or harm toward other people  Destructive Behavior No threats or harm toward property

## 2023-02-04 NOTE — BHH Counselor (Signed)
CSW reached out to [employer], pt's DSS caseworker at [facility] to get update on progress with guardianship.   CSW awaiting response.   Reynaldo Minium, MSW, [place] 02/04/2023 3:54 PM

## 2023-02-04 NOTE — Group Note (Signed)
Recreation Therapy Group Note   Group Topic:General Recreation  Group Date: 02/04/2023 Start Time: 1410 End Time: 1500 Facilitators: Rosina Lowenstein, LRT, CTRS Location: Courtyard  Group Description: Outdoor Recreation. Patients had the option to play corn hole, ring toss, bowling or listening to music while outside in the courtyard getting fresh air and sunlight. Marland Kitchen LRT and patients discussed things that they enjoy doing in their free time outside of the hospital. LRT encouraged patients to drink water after being active and getting their heart rate up.    Goal Area(s) Addressed: Patient will identify leisure interests.  Patient will practice healthy decision making. Patient will engage in recreation activity.   Affect/Mood: N/A   Participation Level: Did not attend    Clinical Observations/Individualized Feedback: Angelik did not attend group.   Plan: Continue to engage patient in RT group sessions 2-3x/week.   Rosina Lowenstein, LRT, CTRS 02/04/2023 3:30 PM

## 2023-02-04 NOTE — Progress Notes (Signed)
Patient observed lying in bed, resting at shift change. Patient ambulated to the dayroom for snack and attended wrap up group. Patient participated in shift assessment appropriately. Patient is med compliant, received PRN Tylenol 650 mg PO for lower back pain (8) and Trazodone 50 mg PO for insomnia. Patient denies SI/HI/AVH and reports a BM today. Pt. VSS, no acute distress noted. Routine observations to monitor safety to continue.

## 2023-02-05 DIAGNOSIS — F250 Schizoaffective disorder, bipolar type: Secondary | ICD-10-CM | POA: Diagnosis not present

## 2023-02-05 NOTE — Group Note (Signed)
Date:  02/05/2023 Time:  11:12 AM  Group Topic/Focus:  Values Group The purpose of this group is for patients to identity their values as to relationships,parenting,marriage,career spiritual and health. Also rating from 0-10 about how important they are to the patients.    Participation Level:  Did Not Attend  Participation Quality:    Affect:   Cognitive:    Insight:   Engagement in Group:    Modes of Intervention:    Additional Comments:  Did not attend  Ailis Rigaud T Jasraj Lappe 02/05/2023, 11:12 AM

## 2023-02-05 NOTE — Progress Notes (Signed)
   02/05/23 1045  Psych Admission Type (Psych Patients Only)  Admission Status Voluntary  Psychosocial Assessment  Patient Complaints None  Eye Contact Brief  Facial Expression Flat  Affect Flat  Speech Slurred  Interaction Minimal;Isolative  Motor Activity Shuffling  Appearance/Hygiene Layered clothes  Behavior Characteristics Cooperative  Mood Sad  Thought Process  Coherency WDL  Content WDL  Delusions None reported or observed  Perception Hallucinations  Hallucination Auditory  Judgment Impaired  Confusion None  Danger to Self  Current suicidal ideation? Denies  Agreement Not to Harm Self Yes  Danger to Others  Danger to Others None reported or observed  Danger to Others Abnormal  Harmful Behavior to others No threats or harm toward other people  Destructive Behavior No threats or harm toward property

## 2023-02-05 NOTE — Group Note (Signed)
Date:  02/05/2023 Time:  2:28 AM  Group Topic/Focus:  Self Care:   The focus of this group is to help patients understand the importance of self-care in order to improve or restore emotional, physical, spiritual, interpersonal, and financial health.    Participation Level:  Did Not Attend  Participation Quality:      Affect:      Cognitive:      Insight: None  Engagement in Group:  None  Modes of Intervention:      Additional Comments:    [name] 02/05/2023, 2:28 AM

## 2023-02-05 NOTE — Progress Notes (Signed)
[facility] MD Progress Note  Betty Henderson  MRN:  [id number]  Subjective: Case discussed in multidisciplinary meeting today, chart reviewed, patient seen today during rounds. Social worker is working with DSS on placement.  Patient reports she is doing "good" today. No new acute issues overnight reported by the staff. Patient denies any acute events overnight.  Patient has been attending groups and participating in milieu.  She is interacting well with her peers.  Patient denies auditory or visual hallucinations.      Principal Problem: Schizoaffective disorder, bipolar type (HCC) Diagnosis: Principal Problem:   Schizoaffective disorder, bipolar type (HCC)   Past Psychiatric History: Schizoaffective disorder, bipolar type.  Past Medical History:  Past Medical History:  Diagnosis Date   Anemia    Arthritis    Chronic pain    Drug-seeking behavior    Malingering    Osteopetrosis    Psychosis (HCC)    Schizoaffective disorder, bipolar type (HCC)     Past Surgical History:  Procedure Laterality Date   MOUTH SURGERY     TUBAL LIGATION     Family History:  Family History  Family history unknown: Yes   Family Psychiatric  History: Unremarkable Social History:  Social History   Substance and Sexual Activity  Alcohol Use Not Currently   Comment: 1 cocktail 3 weeks ago     Social History   Substance and Sexual Activity  Drug Use Not Currently   Types: Cocaine   Comment: states "it's legal though"    Social History   Socioeconomic History   Marital status: Single    Spouse name: Not on file   Number of children: Not on file   Years of education: Not on file   Highest education level: Not on file  Occupational History   Not on file  Tobacco Use   Smoking status: Every Day    Current packs/day: 0.50    Types: Cigarettes   Smokeless tobacco: Never  Vaping Use   Vaping status: Never Used  Substance and Sexual Activity   Alcohol use: Not Currently    Comment: 1  cocktail 3 weeks ago   Drug use: Not Currently    Types: Cocaine    Comment: states "it's legal though"   Sexual activity: Never  Other Topics Concern   Not on file  Social History Narrative   Not on file   Social Determinants of Health   Financial Resource Strain: Not on file  Food Insecurity: Food Insecurity Present (08/22/2022)   Hunger Vital Sign    Worried About Running Out of Food in the Last Year: Sometimes true    Ran Out of Food in the Last Year: Sometimes true  Transportation Needs: Patient Unable To Answer (08/22/2022)   PRAPARE - Transportation    Lack of Transportation (Medical): Patient unable to answer    Lack of Transportation (Non-Medical): Patient unable to answer  Recent Concern: Transportation Needs - Unmet Transportation Needs (06/13/2022)   Received from [facility], [facility]   [facility] - Transportation    Lack of Transportation (Medical): Not on file    Lack of Transportation (Non-Medical): Yes  Physical Activity: Not on file  Stress: Not on file  Social Connections: Unknown (08/06/2021)   Received from [facility], [facility]   Social Network    Social Network: Not on file   Additional Social History:     Patient is homeless at this time.  Social worker is working with DSS on [employer] application and  placement.                    Sleep Good  Appetite:  Good  Current Medications: Current Facility-Administered Medications  Medication Dose Route Frequency Provider Last Rate Last Admin   acetaminophen (TYLENOL) tablet 650 mg  650 mg Oral Q6H PRN [name], DO   650 mg at 02/03/23 2117   alum & mag hydroxide-simeth (MAALOX/MYLANTA) 200-200-20 MG/5ML suspension 30 mL  30 mL Oral Q4H PRN [name], DO   30 mL at 01/30/23 2157   aspirin EC tablet 81 mg  81 mg Oral Daily [name], MD   81 mg at 02/05/23 0846   atorvastatin (LIPITOR) tablet 20 mg  20 mg Oral Daily [name], MD   20 mg at 02/05/23 0846    diphenhydrAMINE (BENADRYL) capsule 50 mg  50 mg Oral Q6H PRN [name], DO   50 mg at 09/29/22 2254   Or   diphenhydrAMINE (BENADRYL) injection 50 mg  50 mg Intramuscular Q6H PRN [name], DO       glimepiride (AMARYL) tablet 4 mg  4 mg Oral Q breakfast [name], MD   4 mg at 02/05/23 0846   haloperidol (HALDOL) tablet 5 mg  5 mg Oral Q6H PRN [name], DO   5 mg at 09/28/22 2149   Or   haloperidol lactate (HALDOL) injection 5 mg  5 mg Intramuscular Q6H PRN [name], DO       ibuprofen (ADVIL) tablet 600 mg  600 mg Oral Q6H PRN [name], [name], MD   600 mg at 01/29/23 0751   LORazepam (ATIVAN) tablet 1 mg  1 mg Oral TID PRN [name], DO   1 mg at 01/23/23 2133   magnesium hydroxide (MILK OF MAGNESIA) suspension 30 mL  30 mL Oral Daily PRN [name], DO   30 mL at 11/08/22 1012   metFORMIN (GLUCOPHAGE) tablet 500 mg  500 mg Oral Q breakfast [name], MD   500 mg at 02/05/23 0846   oxybutynin (DITROPAN) tablet 5 mg  5 mg Oral BID [name], MD   5 mg at 02/05/23 0846   paliperidone (INVEGA SUSTENNA) injection 156 mg  156 mg Intramuscular Q28 days [name], MD   156 mg at 01/15/23 1501   traZODone (DESYREL) tablet 50 mg  50 mg Oral QHS PRN [name], DO   50 mg at 02/03/23 2117    Lab Results:  No results found for this or any previous visit (from the past 48 hour(s)).   Blood Alcohol level:  Lab Results  Component Value Date   ETH <10 08/17/2022   ETH <10 01/11/2021      Musculoskeletal: Strength & Muscle Tone: within normal limits Gait & Station: normal Patient leans: N/A   Psychiatric Specialty Exam:   Presentation  General Appearance:  Casual; Neat   Eye Contact: Fair   Speech: Spontaneous   Speech Volume: Normal   Handedness: Right     Mood and Affect  Mood: "good"   Affect: Stable and pleasant    Thought  Processes: Improved, at baseline   Descriptions of Associations: Intact   Orientation: Well-oriented   Thought Content: Improved, at baseline  Hallucinations:Denies, occasionally heard talking to self in her room  Ideas of Reference:None noted   Suicidal Thoughts:Denies SI, no intention or plan  Homicidal Thoughts:Denies HI, no intention or plan  Sensorium  Memory: Immediate Fair; Remote Poor   Judgment: Fair, pt is Rx compliant   Insight: Improved     Executive Functions  Concentration: Improved   Attention Span: Improved   Language: Fair     Psychomotor Activity  Psychomotor Activity: Normal  Assets  Assets: Communication Skills     Sleep  Sleep:Improved     Physical Exam: Physical Exam Vitals and nursing note reviewed.  Constitutional:      Appearance: Normal appearance. She is normal weight.  Neurological:     General: No focal deficit present.     Mental Status: She is alert.      Review of Systems  Constitutional: Negative.   HENT: Negative.    Eyes: Negative.   Respiratory: Negative.    Cardiovascular: Negative.   Gastrointestinal: Negative.   Genitourinary: Negative.   Musculoskeletal: Negative.   Skin: Negative.   Neurological: Negative.   Endo/Heme/Allergies: Negative.     Blood pressure 123/84, pulse 84, temperature 97.9 F (36.6 C), resp. rate 16, height 5\' 6"  (1.676 m), weight (P) 82.8 kg, SpO2 97%. Body mass index is 29.46 kg/m (pended).   Treatment Plan Summary: Daily contact with patient to assess and evaluate symptoms and progress in treatment, Medication management, and Plan continue current medications.   Continue to monitor patient on as needed meds   Patient received  dose of Invega Sustenna 156 mg IM on  01/15/23 Oxybutynin 5 mg by mouth twice daily to help with urinary incontinence Continue on Metformin and Amaryl.   Continue on atorvastatin for Mixed hyperlipidemia   Patient is awaiting placement     [name], MD

## 2023-02-05 NOTE — Group Note (Signed)
Recreation Therapy Group Note   Group Topic:Health and Wellness  Group Date: 02/05/2023 Start Time: 1400 End Time: 1445 Facilitators: Rosina Lowenstein, LRT, CTRS Location:  Day Room  Group Description: Seated Exercise. LRT discussed the mental and physical benefits of exercise. LRT and group discussed how physical activity can be used as a coping skill. Pt's and LRT followed along to an exercise video on the TV screen that provided a visual representation and audio description of every exercise performed. Pt's encouraged to listen to their bodies and stop at any time if they experience feelings of discomfort or pain. Pts were encouraged to drink water and stay hydrated.   Goal Area(s) Addressed: Patient will learn benefits of physical activity. Patient will identify exercise as a coping skill.  Patient will follow multistep directions. Patient will try a new leisure interest.    Affect/Mood: N/A   Participation Level: Did not attend    Clinical Observations/Individualized Feedback: Betty Henderson did not attend group.   Plan: Continue to engage patient in RT group sessions 2-3x/week.   Rosina Lowenstein, LRT, CTRS 02/05/2023 3:04 PM

## 2023-02-06 DIAGNOSIS — F250 Schizoaffective disorder, bipolar type: Secondary | ICD-10-CM | POA: Diagnosis not present

## 2023-02-06 NOTE — BHH Counselor (Signed)
CSW received VM from [facility], DSS caseworker.   Betty Henderson reports it is most likely Monday when they will have a guardianship court date.   Betty Henderson wants CSW to follow-up on long-term care Medicaid in order to get pt placed faster.   CSW informed leadership and is awaiting response from leaders on speeding up the application process for Medicaid as CSW has asked financial navigators to follow-up on multiple occasions.   Betty Henderson, MSW, [facility] 02/10/2023 11:51 AM

## 2023-02-06 NOTE — Progress Notes (Signed)
 Patient with flat affect.  Denies SI/HI and AVH. Denies anxiety and depression. Denies pain.    Compliant with scheduled medications.  15 min checks in place for safety.  Patient isolates to room with exception of meals.  Minimal interaction with peers and staff.

## 2023-02-06 NOTE — Group Note (Signed)
Date:  02/06/2023 Time:  9:21 PM  Group Topic/Focus:  Goals Group:   The focus of this group is to help patients establish daily goals to achieve during treatment and discuss how the patient can incorporate goal setting into their daily lives to aide in recovery.    Participation Level:  Active  Participation Quality:  Appropriate  Affect:  Appropriate  Cognitive:  Appropriate  Insight: Appropriate and Good  Engagement in Group:  Distracting and Engaged  Modes of Intervention:  Discussion  Additional Comments:    [name] 02/06/2023, 9:21 PM

## 2023-02-06 NOTE — Plan of Care (Signed)

## 2023-02-06 NOTE — Inpatient Diabetes Management (Signed)
Inpatient Diabetes Program Recommendations  AACE/ADA: New Consensus Statement on Inpatient Glycemic Control (2015)  Target Ranges:  Prepandial:   less than 140 mg/dL      Peak postprandial:   less than 180 mg/dL (1-2 hours)      Critically ill patients:  140 - 180 mg/dL    Diabetes history: New Onset DM2   Current orders: Amaryl 4 mg daily, Metformin 500 mg daily         MD- Please consider adding orders for CBG checks TID AC to check to see if the Amaryl and Metformin medications need any adjustment prior to d/c       --Will follow patient during hospitalization--  Ambrose Finland RN, MSN, CDCES Diabetes Coordinator Inpatient Glycemic Control Team Team Pager: [phone] (8a-5p)

## 2023-02-06 NOTE — Plan of Care (Signed)
  Problem: Nutrition: Goal: Adequate nutrition will be maintained Outcome: Progressing   Problem: Coping: Goal: Level of anxiety will decrease Outcome: Progressing   

## 2023-02-06 NOTE — Group Note (Signed)
Recreation Therapy Group Note   Group Topic:General Recreation  Group Date: 02/06/2023 Start Time: 1400 End Time: 1450 Facilitators: Rosina Lowenstein, LRT, CTRS Location:  Day Room  Group Description: Bingo. LRT and patients played multiple games of Bingo with music playing in the background. LRT and pts discussed how this could be a leisure interest and the importance of doing things they enjoy post-discharge. Pts won stress balls and [employer] as Chief Financial Officer.   Goal Area(s) Addressed: Patient will identify leisure interests.  Patient will practice healthy decision making. Patient will engage in recreation activity.  Patient will increase communication.    Affect/Mood: N/A   Participation Level: Did not attend    Clinical Observations/Individualized Feedback: Betty Henderson did not attend group.   Plan: Continue to engage patient in RT group sessions 2-3x/week.   Rosina Lowenstein, LRT, CTRS 02/06/2023 3:13 PM

## 2023-02-06 NOTE — Group Note (Signed)
Date:  02/06/2023 Time:  12:08 PM  Group Topic/Focus:  Overcoming Stress:   The focus of this group is to define stress and help patients assess their triggers.    Participation Level:  Did Not Attend  Participation Quality:    Affect:    Cognitive:    Insight: None  Engagement in Group:    Modes of Intervention:    Additional Comments:  Did Not Attend   Ashunti Schofield l Shantal Roan 02/06/2023, 12:08 PM

## 2023-02-06 NOTE — Progress Notes (Signed)
[facility] MD Progress Note  Betty Henderson  MRN:  [id number]  Subjective: Case discussed in multidisciplinary meeting today, chart reviewed, patient seen today during rounds. Social worker is working with DSS on placement.  Patient reports she is doing "Ok" today. No new acute issues overnight reported by the staff. Patient denies any acute events overnight.  Patient has been attending groups and participating in milieu.  She is interacting well with her peers.  Patient denies auditory or visual hallucinations.      Principal Problem: Schizoaffective disorder, bipolar type (HCC) Diagnosis: Principal Problem:   Schizoaffective disorder, bipolar type (HCC)   Past Psychiatric History: Schizoaffective disorder, bipolar type.  Past Medical History:  Past Medical History:  Diagnosis Date   Anemia    Arthritis    Chronic pain    Drug-seeking behavior    Malingering    Osteopetrosis    Psychosis (HCC)    Schizoaffective disorder, bipolar type (HCC)     Past Surgical History:  Procedure Laterality Date   MOUTH SURGERY     TUBAL LIGATION     Family History:  Family History  Family history unknown: Yes   Family Psychiatric  History: Unremarkable Social History:  Social History   Substance and Sexual Activity  Alcohol Use Not Currently   Comment: 1 cocktail 3 weeks ago     Social History   Substance and Sexual Activity  Drug Use Not Currently   Types: Cocaine   Comment: states "it's legal though"    Social History   Socioeconomic History   Marital status: Single    Spouse name: Not on file   Number of children: Not on file   Years of education: Not on file   Highest education level: Not on file  Occupational History   Not on file  Tobacco Use   Smoking status: Every Day    Current packs/day: 0.50    Types: Cigarettes   Smokeless tobacco: Never  Vaping Use   Vaping status: Never Used  Substance and Sexual Activity   Alcohol use: Not Currently    Comment: 1  cocktail 3 weeks ago   Drug use: Not Currently    Types: Cocaine    Comment: states "it's legal though"   Sexual activity: Never  Other Topics Concern   Not on file  Social History Narrative   Not on file   Social Determinants of Health   Financial Resource Strain: Not on file  Food Insecurity: Food Insecurity Present (08/22/2022)   Hunger Vital Sign    Worried About Running Out of Food in the Last Year: Sometimes true    Ran Out of Food in the Last Year: Sometimes true  Transportation Needs: Patient Unable To Answer (08/22/2022)   PRAPARE - Transportation    Lack of Transportation (Medical): Patient unable to answer    Lack of Transportation (Non-Medical): Patient unable to answer  Recent Concern: Transportation Needs - Unmet Transportation Needs (06/13/2022)   Received from [facility], [facility]   [facility] - Transportation    Lack of Transportation (Medical): Not on file    Lack of Transportation (Non-Medical): Yes  Physical Activity: Not on file  Stress: Not on file  Social Connections: Unknown (08/06/2021)   Received from [facility], [facility]   Social Network    Social Network: Not on file   Additional Social History:     Patient is homeless at this time.  Social worker is working with DSS on [employer] application and  placement.                    Sleep Good  Appetite:  Good  Current Medications: Current Facility-Administered Medications  Medication Dose Route Frequency Provider Last Rate Last Admin   acetaminophen (TYLENOL) tablet 650 mg  650 mg Oral Q6H PRN [name], DO   650 mg at 02/03/23 2117   alum & mag hydroxide-simeth (MAALOX/MYLANTA) 200-200-20 MG/5ML suspension 30 mL  30 mL Oral Q4H PRN [name], DO   30 mL at 01/30/23 2157   aspirin EC tablet 81 mg  81 mg Oral Daily [name], MD   81 mg at 02/06/23 6578   atorvastatin (LIPITOR) tablet 20 mg  20 mg Oral Daily [name], MD   20 mg at 02/06/23 4696    diphenhydrAMINE (BENADRYL) capsule 50 mg  50 mg Oral Q6H PRN [name], DO   50 mg at 09/29/22 2254   Or   diphenhydrAMINE (BENADRYL) injection 50 mg  50 mg Intramuscular Q6H PRN [name], DO       glimepiride (AMARYL) tablet 4 mg  4 mg Oral Q breakfast [name], MD   4 mg at 02/06/23 2952   haloperidol (HALDOL) tablet 5 mg  5 mg Oral Q6H PRN [name], DO   5 mg at 09/28/22 2149   Or   haloperidol lactate (HALDOL) injection 5 mg  5 mg Intramuscular Q6H PRN [name], DO       ibuprofen (ADVIL) tablet 600 mg  600 mg Oral Q6H PRN [name], [name], MD   600 mg at 01/29/23 0751   LORazepam (ATIVAN) tablet 1 mg  1 mg Oral TID PRN [name], DO   1 mg at 01/23/23 2133   magnesium hydroxide (MILK OF MAGNESIA) suspension 30 mL  30 mL Oral Daily PRN [name], DO   30 mL at 11/08/22 1012   metFORMIN (GLUCOPHAGE) tablet 500 mg  500 mg Oral Q breakfast [name], MD   500 mg at 02/06/23 8413   oxybutynin (DITROPAN) tablet 5 mg  5 mg Oral BID [name], MD   5 mg at 02/06/23 0828   paliperidone (INVEGA SUSTENNA) injection 156 mg  156 mg Intramuscular Q28 days [name], MD   156 mg at 01/15/23 1501   traZODone (DESYREL) tablet 50 mg  50 mg Oral QHS PRN [name], DO   50 mg at 02/03/23 2117    Lab Results:  No results found for this or any previous visit (from the past 48 hour(s)).   Blood Alcohol level:  Lab Results  Component Value Date   ETH <10 08/17/2022   ETH <10 01/11/2021      Musculoskeletal: Strength & Muscle Tone: within normal limits Gait & Station: normal Patient leans: N/A   Psychiatric Specialty Exam:   Presentation  General Appearance:  Casual; Neat   Eye Contact: Fair   Speech: Spontaneous   Speech Volume: Normal   Handedness: Right     Mood and Affect  Mood: "Ok"   Affect: Stable and pleasant    Thought Processes: Improved,  at baseline   Descriptions of Associations: Intact   Orientation: Well-oriented   Thought Content: Improved, at baseline  Hallucinations:Denies, occasionally heard talking to self in her room  Ideas of Reference:None noted   Suicidal Thoughts:Denies SI, no intention or plan  Homicidal Thoughts:Denies HI, no intention or plan  Sensorium  Memory: Immediate Fair; Remote Poor   Judgment: Fair, pt is Rx compliant   Insight: Improved     Executive Functions  Concentration: Improved   Attention Span: Improved   Language: Fair     Psychomotor Activity  Psychomotor Activity: Normal  Assets  Assets: Communication Skills     Sleep  Sleep:Improved     Physical Exam: Physical Exam Vitals and nursing note reviewed.  Constitutional:      Appearance: Normal appearance. She is normal weight.  Neurological:     General: No focal deficit present.     Mental Status: She is alert.      Review of Systems  Constitutional: Negative.   HENT: Negative.    Eyes: Negative.   Respiratory: Negative.    Cardiovascular: Negative.   Gastrointestinal: Negative.   Genitourinary: Negative.   Musculoskeletal: Negative.   Skin: Negative.   Neurological: Negative.   Endo/Heme/Allergies: Negative.     Blood pressure 113/66, pulse 62, temperature (!) 97.5 F (36.4 C), resp. rate 16, height 5\' 6"  (1.676 m), weight (P) 82.8 kg, SpO2 100%. Body mass index is 29.46 kg/m (pended).   Treatment Plan Summary: Daily contact with patient to assess and evaluate symptoms and progress in treatment, Medication management, and Plan continue current medications.   Continue to monitor patient on as needed meds   Patient received  dose of Invega Sustenna 156 mg IM on  01/15/23 Oxybutynin 5 mg by mouth twice daily to help with urinary incontinence Continue on Metformin and Amaryl.   Continue on atorvastatin for Mixed hyperlipidemia   Patient is awaiting placement    [name], MD

## 2023-02-07 DIAGNOSIS — F250 Schizoaffective disorder, bipolar type: Secondary | ICD-10-CM | POA: Diagnosis not present

## 2023-02-07 LAB — PROINSULIN/INSULIN RATIO
Insulin: 12 u[IU]/mL
Proinsulin: 8.7 pmol/L

## 2023-02-07 NOTE — Group Note (Unsigned)
Date:  02/07/2023 Time:  11:19 PM  Group Topic/Focus:  Watch Movie     Participation Level:  {BHH PARTICIPATION HQION:62952}  Participation Quality:  {BHH PARTICIPATION QUALITY:22265}  Affect:  {BHH AFFECT:22266}  Cognitive:  {BHH COGNITIVE:22267}  Insight: {BHH Insight2:20797}  Engagement in Group:  {BHH ENGAGEMENT IN GROUP:22268}  Modes of Intervention:  {BHH MODES OF INTERVENTION:22269}  Additional Comments:  ***  Lenore Cordia 02/07/2023, 11:19 PM

## 2023-02-07 NOTE — BH IP Treatment Plan (Signed)
Interdisciplinary Treatment and Diagnostic Plan Update  02/07/2023 Time of Session: 9:30 AM  Emerlynn Henderson MRN: [id number]  Principal Diagnosis: Schizoaffective disorder, bipolar type (HCC)  Secondary Diagnoses: Principal Problem:   Schizoaffective disorder, bipolar type (HCC)   Current Medications:  Current Facility-Administered Medications  Medication Dose Route Frequency Provider Last Rate Last Admin   acetaminophen (TYLENOL) tablet 650 mg  650 mg Oral Q6H PRN [name], DO   650 mg at 02/06/23 2142   alum & mag hydroxide-simeth (MAALOX/MYLANTA) 200-200-20 MG/5ML suspension 30 mL  30 mL Oral Q4H PRN [name], DO   30 mL at 02/06/23 2308   aspirin EC tablet 81 mg  81 mg Oral Daily [name], MD   81 mg at 02/07/23 0803   atorvastatin (LIPITOR) tablet 20 mg  20 mg Oral Daily [name], MD   20 mg at 02/07/23 0802   diphenhydrAMINE (BENADRYL) capsule 50 mg  50 mg Oral Q6H PRN [name], DO   50 mg at 09/29/22 2254   Or   diphenhydrAMINE (BENADRYL) injection 50 mg  50 mg Intramuscular Q6H PRN [name], DO       glimepiride (AMARYL) tablet 4 mg  4 mg Oral Q breakfast [name], MD   4 mg at 02/07/23 4696   haloperidol (HALDOL) tablet 5 mg  5 mg Oral Q6H PRN [name], DO   5 mg at 09/28/22 2149   Or   haloperidol lactate (HALDOL) injection 5 mg  5 mg Intramuscular Q6H PRN [name], DO       ibuprofen (ADVIL) tablet 600 mg  600 mg Oral Q6H PRN [name], [name], MD   600 mg at 01/29/23 0751   LORazepam (ATIVAN) tablet 1 mg  1 mg Oral TID PRN [name], DO   1 mg at 01/23/23 2133   magnesium hydroxide (MILK OF MAGNESIA) suspension 30 mL  30 mL Oral Daily PRN [name], DO   30 mL at 11/08/22 1012   metFORMIN (GLUCOPHAGE) tablet 500 mg  500 mg Oral Q breakfast [name], MD   500 mg at 02/07/23 0803   oxybutynin (DITROPAN) tablet 5 mg  5 mg Oral BID [name], MD   5 mg at 02/07/23 0803   paliperidone (INVEGA SUSTENNA) injection 156 mg  156 mg Intramuscular Q28 days [name], MD   156 mg at 01/15/23 1501   traZODone (DESYREL) tablet 50 mg  50 mg Oral QHS PRN [name], DO   50 mg at 02/06/23 2143   PTA Medications: Medications Prior to Admission  Medication Sig Dispense Refill Last Dose   ondansetron (ZOFRAN) 4 MG tablet Take 1 tablet (4 mg total) by mouth every 8 (eight) hours as needed for nausea or vomiting. (Patient not taking: Reported on 08/17/2022) 20 tablet 0     Patient Stressors: Financial difficulties   Health problems   Medication change or noncompliance    Patient Strengths: Ability for insight   Treatment Modalities: Medication Management, Group therapy, Case management,  1 to 1 session with clinician, Psychoeducation, Recreational therapy.   Physician Treatment Plan for Primary Diagnosis: Schizoaffective disorder, bipolar type (HCC) Long Term Goal(s): Improvement in symptoms so as ready for discharge   Short Term Goals: Ability to identify changes in lifestyle to reduce recurrence of condition will improve Ability to verbalize feelings will improve Ability to disclose and discuss suicidal ideas Ability to demonstrate  self-control will improve Ability to identify and develop effective coping behaviors will improve Ability to maintain clinical measurements within normal limits will improve Compliance with prescribed medications will improve Ability to identify triggers associated with substance abuse/mental health issues will improve  Medication Management: Evaluate patient's response, side effects, and tolerance of medication regimen.  Therapeutic Interventions: 1 to 1 sessions, Unit Group sessions and Medication administration.  Evaluation of Outcomes: Progressing  Physician Treatment Plan for Secondary Diagnosis: Principal Problem:   Schizoaffective disorder, bipolar type (HCC)  Long  Term Goal(s): Improvement in symptoms so as ready for discharge   Short Term Goals: Ability to identify changes in lifestyle to reduce recurrence of condition will improve Ability to verbalize feelings will improve Ability to disclose and discuss suicidal ideas Ability to demonstrate self-control will improve Ability to identify and develop effective coping behaviors will improve Ability to maintain clinical measurements within normal limits will improve Compliance with prescribed medications will improve Ability to identify triggers associated with substance abuse/mental health issues will improve     Medication Management: Evaluate patient's response, side effects, and tolerance of medication regimen.  Therapeutic Interventions: 1 to 1 sessions, Unit Group sessions and Medication administration.  Evaluation of Outcomes: Progressing   RN Treatment Plan for Primary Diagnosis: Schizoaffective disorder, bipolar type (HCC) Long Term Goal(s): Knowledge of disease and therapeutic regimen to maintain health will improve  Short Term Goals: Ability to remain free from injury will improve, Ability to verbalize frustration and anger appropriately will improve, Ability to demonstrate self-control, Ability to participate in decision making will improve, Ability to verbalize feelings will improve, Ability to disclose and discuss suicidal ideas, Ability to identify and develop effective coping behaviors will improve, and Compliance with prescribed medications will improve  Medication Management: RN will administer medications as ordered by provider, will assess and evaluate patient's response and provide education to patient for prescribed medication. RN will report any adverse and/or side effects to prescribing provider.  Therapeutic Interventions: 1 on 1 counseling sessions, Psychoeducation, Medication administration, Evaluate responses to treatment, Monitor vital signs and CBGs as ordered,  Perform/monitor CIWA, COWS, AIMS and Fall Risk screenings as ordered, Perform wound care treatments as ordered.  Evaluation of Outcomes: Progressing   LCSW Treatment Plan for Primary Diagnosis: Schizoaffective disorder, bipolar type (HCC) Long Term Goal(s): Safe transition to appropriate next level of care at discharge, Engage patient in therapeutic group addressing interpersonal concerns.  Short Term Goals: Engage patient in aftercare planning with referrals and resources, Increase social support, Increase ability to appropriately verbalize feelings, Increase emotional regulation, Facilitate acceptance of mental health diagnosis and concerns, Facilitate patient progression through stages of change regarding substance use diagnoses and concerns, Identify triggers associated with mental health/substance abuse issues, and Increase skills for wellness and recovery  Therapeutic Interventions: Assess for all discharge needs, 1 to 1 time with Social worker, Explore available resources and support systems, Assess for adequacy in community support network, Educate family and significant other(s) on suicide prevention, Complete Psychosocial Assessment, Interpersonal group therapy.  Evaluation of Outcomes: Progressing   Progress in Treatment: Attending groups: Yes. and No. Participating in groups: Yes. and No. Taking medication as prescribed: Yes. Toleration medication: Yes. Family/Significant other contact made: Yes, individual(s) contacted:   Leler Draper, son, [phone]  Patient understands diagnosis: Yes. Discussing patient identified problems/goals with staff: Yes. Medical problems stabilized or resolved: Yes. Denies suicidal/homicidal ideation: Yes. Issues/concerns per patient self-inventory: No. Other: none   New problem(s) identified: No, Describe:  none Updates 12/12/22: No  changes made at this time Updates 12/17/22: No changes made at this time Update 12/24/22 No changes at this time.  Update 12/29/2022: No changes at this time. Update 01/03/23: No changes at this time Update 01/08/23: No changes at this time Update 01/13/23: No changes at this time Update 01/18/23: None at this time. Update 01/23/2023:  No changes at this time. Update 01/28/23: Pt recently diagnosed with Diabetes Type II  Update 02/02/23: None at this time. Update 02/07/23: No changes at this time    New Short Term/Long Term Goal(s): Update 6/30: none at this time. Update 09/27/2022:  No changes at this time.  Update 10/02/2022:  No changes at this time. Update 10/07/22: No changes at this time 10/12/22: No changes at this time Update 10/18/22: No changes at this time Update 10/23/22: No changes at this time Update 10/28/22: No changes at this time Update 11/07/22: No changes at this time Update 11/12/22: No changes at this time  Update 11/17/22: None at this time. Update 11/22/22: None at this time. Update 11/27/22 No changes at this time. Update 12/02/22 No changes at this time  Update 12/07/22: None at this time. Updates 12/12/22: No changes made at this time Updates 12/17/22: No changes made at this time Update 12/24/22 No changes at this time. Update 12/29/2022: No changes at this time. Update 01/03/23: No changes at this time Update 01/08/23: No changes at this time Update 01/13/23: No changes at this time   Update 01/18/23: No changes at this time. Update 01/23/2023:  No changes at this time. Update 01/28/2023:  No changes at this time. Update 02/02/23: None at this time.  Update 02/07/23: No changes at this time      Patient Goals:  Update 6/30: none at this time. Update 09/27/2022:  No changes at this time. Update 10/02/2022:  No changes at this time. Update 10/07/22: No changes at this time 10/12/22: No changes at this time Update 10/18/22: No changes at this time Update 10/23/22: No changes at this time Update 10/28/22: No changes at this time Update 11/07/22: No changes at this time Update 11/12/22: No changes at this time Update 11/17/22: None  at this time. Update 11/22/22: None at this time. Update 11/27/22 No changes at this time Update 12/02/22 No changes at this time   Update 12/07/22: None at this time. Updates 12/12/22: No changes made at this time Updates 12/17/22: No changes made at this time Update 12/24/22 No changes at this time. Update 12/29/2022: No changes at this time. Update 01/03/23: No changes at this time Update 01/08/23: No changes at this time Update 01/13/23: No changes at this time   Update 01/18/23: No changes at this time. Update 01/23/2023:  No changes at this time.  Update 01/28/2023:  No changes at this time. Update 02/02/23: None at this time.  Update 02/07/23: No changes at this time      Discharge Plan or Barriers: Update 6/30: APS report has been made and patient is being investigated for guardianship needs.  Remains homeless with limited supports. Update 09/27/2022:  No changes at this time.  Update 10/02/2022:  Patient remains safe on the unit at this time.  Patient remains psychotic at this time.  APS report has been made, however, no follow up from the caseworker on her case.  No safe discharge identified.  CSW has requested that application for Medicaid and disability be completed.   Update 10/07/22: No changes at this time 10/12/22: No changes at this time Update  10/18/22: No changes at this time Update 10/23/22: No changes at this time Update 10/23/22: No changes at this time Update 10/28/22: According to [facility], pt's caseworker at DSS, the petition was filed for guardianship on 10/21/22, now awaiting for the petition to be approvedUpdate 11/07/22: No changes at this time Update 11/12/22: CSW sent over patients information to [facility] and [facility], awaiting a response. CSW awaiting word from DSS regarding pt's petition and what agency will become her guardian. Update 11/17/22: No changes at this time. Update 11/22/22: CSW has sent patients information to multiple facilities that were suggested by leadership, pt has been  denied from the facility or the facility does not respond. CSW continues to send pt's information to nursing homes. Update 11/27/22 CSW contacted DSS to inquire about the status of guardianship. No response at this time Update 12/02/22 Supervisor Loraine Leriche contacted DSS who stated they are not taking pt on for guardianship but that Quincy Carnes will be present tomorrow to do a visit with social worker and pt and that pt's son will be signing documentation for placement for the pt. Update 12/07/22: [facility] DSS has declined to take guardianship. Son, Casimiro Needle is to step into a more present role in deciding placement. A conversation is still needed. Updates 12/12/22: CSW staffed case with leadership and they state that a family meeting must happen with pt's son. CSW contacted pt's financial navigator and her DSS worker so that they may be present on the call and are able to speak to the efforts they have made to secure pt funding and housing. CSW waiting on responses from both, leadership is aware. Updates 12/17/22: CSW awaiting updates from both [facility] DSS and [facility] financial navigators as to status of pt's applications for Medicaid/[employer] and Disability, respectively. Update 12/24/22 CSW sent email regarding family meeting per request of supervisors. CSW awaiting responses from all parties to schedule family meeting. Update: 12/29/2022 No changes at this time. Update 01/03/23: CSW continues to await responses on the scheduling of the family meeting. CSW received call from Primitivo Gauze at [employer], CSW sent FL2 to [place] to aide in pt's discharge planning. Update 01/08/23: CSW has reached out again to leadership and financial counseling. CSW continues to await a response. CSW called Sharlee Blew, awaiting a response for status of pt's placement options. Update 01/13/23: Pt is to meet with psychologist tomorrow 10/22 to complete capacity testing so that DSS can retry for guardianship  Update  01/18/23: The patient is still awaiting placement.  Update 01/23/2023:  Patient remains safe on the unit at this time.  CSW team continues to work on placement.  CSW team in contact with DSS to develop plan on guardianship.   Work continues to Hospital doctor. Update 01/28/2023:  Medicaid application remains pending. DSS awaiting records from medical records to retry fo guardianship.  Update 02/02/23: None at this time.  Update 02/07/23: Sharlee Blew at DSS confirms that pt records were received and that they are petitioning for guardianship, she informs CSW that decision should be made on court date by Monday or Tuesday of next week. CSW still awaits status update on Medicaid application      Reason for Continuation of Hospitalization: Hallucinations Mania Medication stabilization   Estimated Length of Stay:  Update 6/30: 1-7 days Update 09/27/2022:  TBD Update 10/02/2022:  No changes at this time. UpdaUpdate 10/18/22: No changes at this time te 10/07/22: No changes at this time Update 10/18/22: No changes at this  time Update 10/23/22: No changes at this time Update 10/28/22: No changes at this time Update 11/07/22: No changes at this time Update 11/12/22: No changes at this time  Update 11/17/22: No changes at this time. Update 11/22/22: None at this time. Update 11/27/22 No changes at this time. Update 12/02/22 No changes at this time  Update 12/07/22: None at this time. Updates 12/12/22: No changes made at this time Updates 12/17/22: TBD. Update 12/29/2022: No changes at this time. Update 01/03/23: TBDUpdate 01/08/23: TBD Update 01/13/23: TBD  Update 01/18/23: No changes at this time.  Update 01/23/2023:  TBD Update 01/28/2023:  TBD Update 02/02/23: None at this time. Update 02/07/23: TBD  [place] Suicide Severity Risk Score: Flowsheet Row Admission (Current) from 08/22/2022 in [facility] BE[facility] ED from 08/17/2022 in [facility] Emergency Department at [facility] ED from 08/12/2022  in [facility] Emergency Department at [facility]  C-SSRS RISK CATEGORY Low Risk No Risk No Risk       Last [facility] 2/9 Scores:     No data to display          Scribe for Treatment Team: Elza Rafter, Theresia Majors 02/07/2023 11:14 AM

## 2023-02-07 NOTE — Group Note (Signed)
Recreation Therapy Group Note   Group Topic:Leisure Education  Group Date: 02/07/2023 Start Time: 1400 End Time: 1455 Facilitators: Rosina Lowenstein, LRT, CTRS Location: Courtyard  Group Description: Leisure. Patients were given the opportunity to play ring toss, play corn hole, or listen to music while sitting in the courtyard getting fresh air and sunlight. Pt identified and conversated about things they enjoy doing in their free time and how they can continue to do that outside of the hospital.  Goal Area(s) Addressed: Patient will learn the definition of "leisure". Patient will practice making a positive decision. Patient will have the opportunity to try a new leisure activity. Patient will communicate with peers and LRT.   Affect/Mood: N/A   Participation Level: Did not attend    Clinical Observations/Individualized Feedback: Lauraashley did not attend group.   Plan: Continue to engage patient in RT group sessions 2-3x/week.   Rosina Lowenstein, LRT, CTRS 02/07/2023 3:06 PM

## 2023-02-07 NOTE — Progress Notes (Signed)
   02/07/23 1953  Psych Admission Type (Psych Patients Only)  Admission Status Voluntary  Psychosocial Assessment  Patient Complaints Sadness  Eye Contact Brief  Facial Expression Flat  Affect Flat  Speech Logical/coherent  Interaction Minimal;Isolative  Motor Activity Slow;Shuffling  Appearance/Hygiene Layered clothes  Behavior Characteristics Cooperative;Appropriate to situation  Mood Sad;Pleasant  Thought Process  Coherency WDL  Content WDL  Delusions None reported or observed  Perception Hallucinations  Hallucination Auditory  Judgment Impaired  Confusion None  Danger to Self  Current suicidal ideation? Denies  Agreement Not to Harm Self Yes  Description of Agreement Verbal  Danger to Others  Danger to Others None reported or observed  Danger to Others Abnormal  Harmful Behavior to others No threats or harm toward other people

## 2023-02-07 NOTE — Group Note (Signed)

## 2023-02-07 NOTE — Plan of Care (Signed)
  Problem: Education: Goal: Knowledge of General Education information will improve Description: Including pain rating scale, medication(s)/side effects and non-pharmacologic comfort measures Outcome: Progressing   Problem: Coping: Goal: Level of anxiety will decrease Outcome: Progressing   Problem: Safety: Goal: Ability to remain free from injury will improve Outcome: Progressing   

## 2023-02-07 NOTE — Progress Notes (Signed)
[facility] MD Progress Note  Betty Henderson  MRN:  [id number]  Subjective: Case discussed in multidisciplinary meeting today, chart reviewed, patient seen today during rounds. Social worker is working with DSS on placement.  Social worker informed that patient might have court hearing for guardianship soon.  Patient reports she is doing "fine" today. No new acute issues overnight reported by the staff.  Patient asked about her discharge.  Patient was informed that DSS is working on getting guardianship.  Patient has been attending groups and participating in milieu.  She is interacting well with her peers.  Patient denies auditory or visual hallucinations.    Principal Problem: Schizoaffective disorder, bipolar type (HCC) Diagnosis: Principal Problem:   Schizoaffective disorder, bipolar type (HCC)   Past Psychiatric History: Schizoaffective disorder, bipolar type.  Past Medical History:  Past Medical History:  Diagnosis Date   Anemia    Arthritis    Chronic pain    Drug-seeking behavior    Malingering    Osteopetrosis    Psychosis (HCC)    Schizoaffective disorder, bipolar type (HCC)     Past Surgical History:  Procedure Laterality Date   MOUTH SURGERY     TUBAL LIGATION     Family History:  Family History  Family history unknown: Yes   Family Psychiatric  History: Unremarkable Social History:  Social History   Substance and Sexual Activity  Alcohol Use Not Currently   Comment: 1 cocktail 3 weeks ago     Social History   Substance and Sexual Activity  Drug Use Not Currently   Types: Cocaine   Comment: states "it's legal though"    Social History   Socioeconomic History   Marital status: Single    Spouse name: Not on file   Number of children: Not on file   Years of education: Not on file   Highest education level: Not on file  Occupational History   Not on file  Tobacco Use   Smoking status: Every Day    Current packs/day: 0.50    Types: Cigarettes    Smokeless tobacco: Never  Vaping Use   Vaping status: Never Used  Substance and Sexual Activity   Alcohol use: Not Currently    Comment: 1 cocktail 3 weeks ago   Drug use: Not Currently    Types: Cocaine    Comment: states "it's legal though"   Sexual activity: Never  Other Topics Concern   Not on file  Social History Narrative   Not on file   Social Determinants of Health   Financial Resource Strain: Not on file  Food Insecurity: Food Insecurity Present (08/22/2022)   Hunger Vital Sign    Worried About Running Out of Food in the Last Year: Sometimes true    Ran Out of Food in the Last Year: Sometimes true  Transportation Needs: Patient Unable To Answer (08/22/2022)   PRAPARE - Transportation    Lack of Transportation (Medical): Patient unable to answer    Lack of Transportation (Non-Medical): Patient unable to answer  Recent Concern: Transportation Needs - Unmet Transportation Needs (06/13/2022)   Received from [facility], [facility]   [facility] - Transportation    Lack of Transportation (Medical): Not on file    Lack of Transportation (Non-Medical): Yes  Physical Activity: Not on file  Stress: Not on file  Social Connections: Unknown (08/06/2021)   Received from [facility], [facility]   Social Network    Social Network: Not on file   Additional Social  History:     Patient is homeless at this time.  Social worker is working with DSS on [employer] application and  placement.                    Sleep Good  Appetite:  Good  Current Medications: Current Facility-Administered Medications  Medication Dose Route Frequency Provider Last Rate Last Admin   acetaminophen (TYLENOL) tablet 650 mg  650 mg Oral Q6H PRN [name], DO   650 mg at 02/06/23 2142   alum & mag hydroxide-simeth (MAALOX/MYLANTA) 200-200-20 MG/5ML suspension 30 mL  30 mL Oral Q4H PRN [name], DO   30 mL at 02/06/23 2308   aspirin EC tablet 81 mg  81 mg Oral Daily  [name], MD   81 mg at 02/07/23 0803   atorvastatin (LIPITOR) tablet 20 mg  20 mg Oral Daily [name], MD   20 mg at 02/07/23 0802   diphenhydrAMINE (BENADRYL) capsule 50 mg  50 mg Oral Q6H PRN [name], DO   50 mg at 09/29/22 2254   Or   diphenhydrAMINE (BENADRYL) injection 50 mg  50 mg Intramuscular Q6H PRN [name], DO       glimepiride (AMARYL) tablet 4 mg  4 mg Oral Q breakfast [name], MD   4 mg at 02/07/23 1610   haloperidol (HALDOL) tablet 5 mg  5 mg Oral Q6H PRN [name], DO   5 mg at 09/28/22 2149   Or   haloperidol lactate (HALDOL) injection 5 mg  5 mg Intramuscular Q6H PRN [name], DO       ibuprofen (ADVIL) tablet 600 mg  600 mg Oral Q6H PRN [name], [name], MD   600 mg at 01/29/23 0751   LORazepam (ATIVAN) tablet 1 mg  1 mg Oral TID PRN [name], DO   1 mg at 01/23/23 2133   magnesium hydroxide (MILK OF MAGNESIA) suspension 30 mL  30 mL Oral Daily PRN [name], DO   30 mL at 11/08/22 1012   metFORMIN (GLUCOPHAGE) tablet 500 mg  500 mg Oral Q breakfast [name], MD   500 mg at 02/07/23 0803   oxybutynin (DITROPAN) tablet 5 mg  5 mg Oral BID [name], MD   5 mg at 02/07/23 0803   paliperidone (INVEGA SUSTENNA) injection 156 mg  156 mg Intramuscular Q28 days [name], MD   156 mg at 01/15/23 1501   traZODone (DESYREL) tablet 50 mg  50 mg Oral QHS PRN [name], DO   50 mg at 02/06/23 2143    Lab Results:  No results found for this or any previous visit (from the past 48 hour(s)).   Blood Alcohol level:  Lab Results  Component Value Date   ETH <10 08/17/2022   ETH <10 01/11/2021      Musculoskeletal: Strength & Muscle Tone: within normal limits Gait & Station: normal Patient leans: N/A   Psychiatric Specialty Exam:   Presentation  General Appearance:  Casual; Neat   Eye Contact: Fair   Speech: Spontaneous   Speech  Volume: Normal   Handedness: Right     Mood and Affect  Mood: "Fine"   Affect: Stable and pleasant    Thought Processes: Improved, at baseline   Descriptions of Associations: Intact   Orientation: Well-oriented   Thought Content: Improved, at baseline  Hallucinations:Denies, occasionally heard talking to self in her  room  Ideas of Reference:None noted   Suicidal Thoughts:Denies SI, no intention or plan  Homicidal Thoughts:Denies HI, no intention or plan   Sensorium  Memory: Immediate Fair; Remote Poor   Judgment: Fair, pt is Rx compliant   Insight: Improved     Executive Functions  Concentration: Improved   Attention Span: Improved   Language: Fair     Psychomotor Activity  Psychomotor Activity: Normal  Assets  Assets: Communication Skills     Sleep  Sleep:Improved     Physical Exam: Physical Exam Vitals and nursing note reviewed.  Constitutional:      Appearance: Normal appearance. She is normal weight.  Neurological:     General: No focal deficit present.     Mental Status: She is alert.      Review of Systems  Constitutional: Negative.   HENT: Negative.    Eyes: Negative.   Respiratory: Negative.    Cardiovascular: Negative.   Gastrointestinal: Negative.   Genitourinary: Negative.   Musculoskeletal: Negative.   Skin: Negative.   Neurological: Negative.   Endo/Heme/Allergies: Negative.     Blood pressure (!) 105/57, pulse 65, temperature 98.7 F (37.1 C), resp. rate 18, height 5\' 6"  (1.676 m), weight (P) 82.8 kg, SpO2 96%. Body mass index is 29.46 kg/m (pended).   Treatment Plan Summary: Daily contact with patient to assess and evaluate symptoms and progress in treatment, Medication management, and Plan continue current medications.   Continue to monitor patient on as needed meds   Patient received  dose of Invega Sustenna 156 mg IM on  01/15/23 Oxybutynin 5 mg by mouth twice daily to help with urinary  incontinence Continue on Metformin and Amaryl.   Continue on atorvastatin for Mixed hyperlipidemia   Patient is awaiting placement    [name], MD

## 2023-02-07 NOTE — Progress Notes (Signed)
 Patient with flat, sad affect.  Voices no complaints at this time.  Denies SI/HI.  Patient reports she is ready to leave, but knows she needs to be patient.  Denies pain.  Compliant with scheduled medications.  15 min checks in place for safety.  Patient isolates to room with the exception of meals.

## 2023-02-08 DIAGNOSIS — F250 Schizoaffective disorder, bipolar type: Secondary | ICD-10-CM | POA: Diagnosis not present

## 2023-02-08 NOTE — Progress Notes (Signed)
[facility] MD Progress Note  Betty Henderson  MRN:  [id number]  Subjective: Case discussed in multidisciplinary meeting today, chart reviewed, patient seen today during rounds. Social worker is working with DSS on placement.  Social worker informed that patient might have court hearing for guardianship soon.  Patient reports she is doing "fine" today. No new acute issues overnight reported by the staff.  Patient was informed yesterday that DSS is working on getting guardianship.  Patient has been attending few groups and participating in milieu.  She is interacting well with her peers.  Patient denies auditory or visual hallucinations.    Principal Problem: Schizoaffective disorder, bipolar type (HCC) Diagnosis: Principal Problem:   Schizoaffective disorder, bipolar type (HCC)   Past Psychiatric History: Schizoaffective disorder, bipolar type.  Past Medical History:  Past Medical History:  Diagnosis Date   Anemia    Arthritis    Chronic pain    Drug-seeking behavior    Malingering    Osteopetrosis    Psychosis (HCC)    Schizoaffective disorder, bipolar type (HCC)     Past Surgical History:  Procedure Laterality Date   MOUTH SURGERY     TUBAL LIGATION     Family History:  Family History  Family history unknown: Yes   Family Psychiatric  History: Unremarkable Social History:  Social History   Substance and Sexual Activity  Alcohol Use Not Currently   Comment: 1 cocktail 3 weeks ago     Social History   Substance and Sexual Activity  Drug Use Not Currently   Types: Cocaine   Comment: states "it's legal though"    Social History   Socioeconomic History   Marital status: Single    Spouse name: Not on file   Number of children: Not on file   Years of education: Not on file   Highest education level: Not on file  Occupational History   Not on file  Tobacco Use   Smoking status: Every Day    Current packs/day: 0.50    Types: Cigarettes   Smokeless tobacco: Never   Vaping Use   Vaping status: Never Used  Substance and Sexual Activity   Alcohol use: Not Currently    Comment: 1 cocktail 3 weeks ago   Drug use: Not Currently    Types: Cocaine    Comment: states "it's legal though"   Sexual activity: Never  Other Topics Concern   Not on file  Social History Narrative   Not on file   Social Determinants of Health   Financial Resource Strain: Not on file  Food Insecurity: Food Insecurity Present (08/22/2022)   Hunger Vital Sign    Worried About Running Out of Food in the Last Year: Sometimes true    Ran Out of Food in the Last Year: Sometimes true  Transportation Needs: Patient Unable To Answer (08/22/2022)   PRAPARE - Transportation    Lack of Transportation (Medical): Patient unable to answer    Lack of Transportation (Non-Medical): Patient unable to answer  Recent Concern: Transportation Needs - Unmet Transportation Needs (06/13/2022)   Received from [facility], [facility]   [facility] - Transportation    Lack of Transportation (Medical): Not on file    Lack of Transportation (Non-Medical): Yes  Physical Activity: Not on file  Stress: Not on file  Social Connections: Unknown (08/06/2021)   Received from [facility], [facility]   Social Network    Social Network: Not on file   Additional Social History:  Patient is homeless at this time.  Social worker is working with DSS on [employer] application and  placement.                    Sleep Good  Appetite:  Good  Current Medications: Current Facility-Administered Medications  Medication Dose Route Frequency Provider Last Rate Last Admin   acetaminophen (TYLENOL) tablet 650 mg  650 mg Oral Q6H PRN [name], DO   650 mg at 02/07/23 2058   alum & mag hydroxide-simeth (MAALOX/MYLANTA) 200-200-20 MG/5ML suspension 30 mL  30 mL Oral Q4H PRN [name], DO   30 mL at 02/06/23 2308   aspirin EC tablet 81 mg  81 mg Oral Daily [name], MD   81  mg at 02/08/23 1191   atorvastatin (LIPITOR) tablet 20 mg  20 mg Oral Daily [name], MD   20 mg at 02/08/23 4782   diphenhydrAMINE (BENADRYL) capsule 50 mg  50 mg Oral Q6H PRN [name], DO   50 mg at 09/29/22 2254   Or   diphenhydrAMINE (BENADRYL) injection 50 mg  50 mg Intramuscular Q6H PRN [name], DO       glimepiride (AMARYL) tablet 4 mg  4 mg Oral Q breakfast [name], MD   4 mg at 02/08/23 9562   haloperidol (HALDOL) tablet 5 mg  5 mg Oral Q6H PRN [name], DO   5 mg at 09/28/22 2149   Or   haloperidol lactate (HALDOL) injection 5 mg  5 mg Intramuscular Q6H PRN [name], DO       ibuprofen (ADVIL) tablet 600 mg  600 mg Oral Q6H PRN [name], [name], MD   600 mg at 01/29/23 0751   LORazepam (ATIVAN) tablet 1 mg  1 mg Oral TID PRN [name], DO   1 mg at 01/23/23 2133   magnesium hydroxide (MILK OF MAGNESIA) suspension 30 mL  30 mL Oral Daily PRN [name], DO   30 mL at 11/08/22 1012   metFORMIN (GLUCOPHAGE) tablet 500 mg  500 mg Oral Q breakfast [name], MD   500 mg at 02/08/23 1308   oxybutynin (DITROPAN) tablet 5 mg  5 mg Oral BID [name], MD   5 mg at 02/08/23 0928   paliperidone (INVEGA SUSTENNA) injection 156 mg  156 mg Intramuscular Q28 days [name], MD   156 mg at 01/15/23 1501   traZODone (DESYREL) tablet 50 mg  50 mg Oral QHS PRN [name], DO   50 mg at 02/07/23 2057    Lab Results:  No results found for this or any previous visit (from the past 48 hour(s)).   Blood Alcohol level:  Lab Results  Component Value Date   ETH <10 08/17/2022   ETH <10 01/11/2021      Musculoskeletal: Strength & Muscle Tone: within normal limits Gait & Station: normal Patient leans: N/A   Psychiatric Specialty Exam:   Presentation  General Appearance:  Casual; Neat   Eye Contact: Fair   Speech: Spontaneous   Speech Volume: Normal    Handedness: Right     Mood and Affect  Mood: "Fine"   Affect: Stable and pleasant    Thought Processes: Improved, at baseline   Descriptions of Associations: Intact   Orientation: Well-oriented   Thought Content: Improved, at baseline  Hallucinations:Denies, occasionally heard talking to self in her room  Ideas of Reference:None  noted   Suicidal Thoughts:Denies SI, no intention or plan  Homicidal Thoughts:Denies HI, no intention or plan   Sensorium  Memory: Immediate Fair; Remote Poor   Judgment: Fair, pt is Rx compliant   Insight: Improved     Executive Functions  Concentration: Improved   Attention Span: Improved   Language: Fair     Psychomotor Activity  Psychomotor Activity: Normal  Assets  Assets: Communication Skills     Sleep  Sleep:Improved     Physical Exam: Physical Exam Vitals and nursing note reviewed.  Constitutional:      Appearance: Normal appearance. She is normal weight.  Neurological:     General: No focal deficit present.     Mental Status: She is alert.      Review of Systems  Constitutional: Negative.   HENT: Negative.    Eyes: Negative.   Respiratory: Negative.    Cardiovascular: Negative.   Gastrointestinal: Negative.   Genitourinary: Negative.   Musculoskeletal: Negative.   Skin: Negative.   Neurological: Negative.   Endo/Heme/Allergies: Negative.     Blood pressure (!) 106/54, pulse 94, temperature (!) 97.3 F (36.3 C), resp. rate (!) 21, height 5\' 6"  (1.676 m), weight (P) 82.8 kg, SpO2 99%. Body mass index is 29.46 kg/m (pended).   Treatment Plan Summary: Daily contact with patient to assess and evaluate symptoms and progress in treatment, Medication management, and Plan continue current medications.   Continue to monitor patient on as needed meds   Patient received  dose of Invega Sustenna 156 mg IM on  01/15/23 Oxybutynin 5 mg by mouth twice daily to help with urinary  incontinence Continue on Metformin and Amaryl.   Continue on atorvastatin for Mixed hyperlipidemia   Patient is awaiting placement    [name], MD

## 2023-02-08 NOTE — Progress Notes (Signed)
Patient is a voluntary admission to [facility] for SAD - bipolar.  Patient has been calm, cooperative, participates in group activities.  Uses a walker for ambulation. Pleasant with staff. Denies SI, HI, AVH, anxiety and depression.  Will continue to monitor.

## 2023-02-08 NOTE — Progress Notes (Signed)
   02/08/23 0545  15 Minute Checks  Location Bedroom  Visual Appearance Calm  Behavior Sleeping  Sleep (Behavioral Health Patients Only)  Calculate sleep? (Click Yes once per 24 hr at 0600 safety check) Yes  Documented sleep last 24 hours 16.75

## 2023-02-08 NOTE — Group Note (Signed)
LCSW Group Therapy Note  02/08/2023    1:00 - 2:00 PM               Type of Therapy and Topic:  Group Therapy: Understanding the differences between fear and anxiety / Fear Ladder & Examining the Evidence  Participation Level:  Minimal   Description of Group:   In this group session, patients learned how to define and recognize the similarities differences between fear and anxiety. Patients will explore reactions we have to fear and anxiety. Patients identified a fear or something they feel anxious about. Patients analyzed scenarios and discussed both the positive and negative aspects to them. Patients were asked to identify if it is a fear or anxiety as well.  Patients were asked to provide their own examples. Patients will learn what to do for both fear and anxiety. CSW provided tools such as breaking things up into smaller steps, challenging automatic negative thinking / questions to ask, fear ladder and how to use gradual exposure. Patients will be asked to practice each tool with situations and to complete a fear ladder for their personal gain.   Therapeutic Goals: Patients will learn the difference between fear versus anxiety and the definitions of both. Patients will utilize scenarios and be able to identify if this is an example of a fear or anxiety. Patients will learn tools to handle both their fears and anxieties as well as discuss breaking it into smaller steps and exposure.  Patients will be asked to provide examples of their own and discuss how things have both positive and negative aspects.  Patients were provided questions to ask to challenge automatic negative thinking for anxiety.  Patients were provided a sample form of a fear ladder and asked to complete one for a fear.   Summary of Patient Progress:  Patient was minimally engaged and participated in the group session. Patient shared their understanding of fear and anxiety. Patient  discussed different ways to cope with  anxiety including going to church and singing. Patient reports their plan to help with fear/anxiety to be by engaging in coping skills and taking medications.   Therapeutic Modalities:   Cognitive Behavioral Therapy Motivational Interviewing  Brief Therapy  Beatris Si, LCSW  02/08/2023 1:52 PM

## 2023-02-08 NOTE — Group Note (Signed)
Date:  02/07/2023 Time:  08:00 PM  Group Topic/Focus:  Overcoming Stress:   The focus of this group is to define stress and help patients assess their triggers.    Participation Level:  Did Not Attend  Participation Quality:   Did Not Attend  Affect:   Did Not Attend  Cognitive:   Did Not Attend  Insight: None  Engagement in Group:  None  Modes of Intervention:   Did Not Attend  Additional Comments:    [name] 02/08/2023, 5:49 AM

## 2023-02-08 NOTE — Group Note (Signed)
Date:  02/08/2023 Time:  11:19 AM  Group Topic/Focus:  Healthy Communication:   The focus of this group is to discuss communication, barriers to communication, as well as healthy ways to communicate with others.    Participation Level:  Did Not Attend Ardelle Anton 02/08/2023, 11:19 AM

## 2023-02-08 NOTE — Group Note (Signed)
Date:  02/08/2023 Time:  8:25 PM  Group Topic/Focus:  Wrap-Up Group:   The focus of this group is to help patients review their daily goal of treatment and discuss progress on daily workbooks.    Participation Level:  Minimal  Participation Quality:  Appropriate and Attentive  Affect:  Appropriate  Cognitive:  Alert  Insight: Good  Engagement in Group:  Limited  Modes of Intervention:  Discussion  Additional Comments:     Maglione,Robin Pafford E 02/08/2023, 8:25 PM

## 2023-02-09 DIAGNOSIS — F250 Schizoaffective disorder, bipolar type: Secondary | ICD-10-CM | POA: Diagnosis not present

## 2023-02-09 MED ORDER — METFORMIN HCL 500 MG PO TABS
1000.0000 mg | ORAL_TABLET | Freq: Every day | ORAL | Status: DC
  Administered 2023-02-10 – 2023-02-17 (×8): 1000 mg via ORAL
  Filled 2023-02-09 (×8): qty 2

## 2023-02-09 NOTE — Progress Notes (Signed)
[facility] MD Progress Note  Betty Henderson  MRN:  [id number]  Subjective: Case discussed in multidisciplinary meeting today, chart reviewed, patient seen today during rounds. Social worker is working with DSS on placement.  Social worker informed that patient might have court hearing for guardianship soon.  Patient reports she is doing "fine" today. No new acute issues overnight reported by the staff.  Patient was informed that DSS is working on getting guardianship.  Patient has been attending  few groups and participating in milieu.  She is interacting well with her peers.  Patient denies auditory or visual hallucinations.    Principal Problem: Schizoaffective disorder, bipolar type (HCC) Diagnosis: Principal Problem:   Schizoaffective disorder, bipolar type (HCC)   Past Psychiatric History: Schizoaffective disorder, bipolar type.  Past Medical History:  Past Medical History:  Diagnosis Date   Anemia    Arthritis    Chronic pain    Drug-seeking behavior    Malingering    Osteopetrosis    Psychosis (HCC)    Schizoaffective disorder, bipolar type (HCC)     Past Surgical History:  Procedure Laterality Date   MOUTH SURGERY     TUBAL LIGATION     Family History:  Family History  Family history unknown: Yes   Family Psychiatric  History: Unremarkable Social History:  Social History   Substance and Sexual Activity  Alcohol Use Not Currently   Comment: 1 cocktail 3 weeks ago     Social History   Substance and Sexual Activity  Drug Use Not Currently   Types: Cocaine   Comment: states "it's legal though"    Social History   Socioeconomic History   Marital status: Single    Spouse name: Not on file   Number of children: Not on file   Years of education: Not on file   Highest education level: Not on file  Occupational History   Not on file  Tobacco Use   Smoking status: Every Day    Current packs/day: 0.50    Types: Cigarettes   Smokeless tobacco: Never  Vaping  Use   Vaping status: Never Used  Substance and Sexual Activity   Alcohol use: Not Currently    Comment: 1 cocktail 3 weeks ago   Drug use: Not Currently    Types: Cocaine    Comment: states "it's legal though"   Sexual activity: Never  Other Topics Concern   Not on file  Social History Narrative   Not on file   Social Determinants of Health   Financial Resource Strain: Not on file  Food Insecurity: Food Insecurity Present (08/22/2022)   Hunger Vital Sign    Worried About Running Out of Food in the Last Year: Sometimes true    Ran Out of Food in the Last Year: Sometimes true  Transportation Needs: Patient Unable To Answer (08/22/2022)   PRAPARE - Transportation    Lack of Transportation (Medical): Patient unable to answer    Lack of Transportation (Non-Medical): Patient unable to answer  Recent Concern: Transportation Needs - Unmet Transportation Needs (06/13/2022)   Received from [facility], [facility]   [facility] - Transportation    Lack of Transportation (Medical): Not on file    Lack of Transportation (Non-Medical): Yes  Physical Activity: Not on file  Stress: Not on file  Social Connections: Unknown (08/06/2021)   Received from [facility], [facility]   Social Network    Social Network: Not on file   Additional Social History:  Patient is homeless at this time.  Social worker is working with DSS on [employer] application and  placement.                    Sleep Good  Appetite:  Good  Current Medications: Current Facility-Administered Medications  Medication Dose Route Frequency Provider Last Rate Last Admin   acetaminophen (TYLENOL) tablet 650 mg  650 mg Oral Q6H PRN [name], DO   650 mg at 02/08/23 2037   alum & mag hydroxide-simeth (MAALOX/MYLANTA) 200-200-20 MG/5ML suspension 30 mL  30 mL Oral Q4H PRN [name], DO   30 mL at 02/06/23 2308   aspirin EC tablet 81 mg  81 mg Oral Daily [name], MD   81 mg at  02/09/23 0920   atorvastatin (LIPITOR) tablet 20 mg  20 mg Oral Daily [name], MD   20 mg at 02/09/23 0920   diphenhydrAMINE (BENADRYL) capsule 50 mg  50 mg Oral Q6H PRN [name], DO   50 mg at 09/29/22 2254   Or   diphenhydrAMINE (BENADRYL) injection 50 mg  50 mg Intramuscular Q6H PRN [name], DO       glimepiride (AMARYL) tablet 4 mg  4 mg Oral Q breakfast [name], MD   4 mg at 02/09/23 0919   haloperidol (HALDOL) tablet 5 mg  5 mg Oral Q6H PRN [name], DO   5 mg at 09/28/22 2149   Or   haloperidol lactate (HALDOL) injection 5 mg  5 mg Intramuscular Q6H PRN [name], DO       ibuprofen (ADVIL) tablet 600 mg  600 mg Oral Q6H PRN [name], [name], MD   600 mg at 01/29/23 0751   LORazepam (ATIVAN) tablet 1 mg  1 mg Oral TID PRN [name], DO   1 mg at 01/23/23 2133   magnesium hydroxide (MILK OF MAGNESIA) suspension 30 mL  30 mL Oral Daily PRN [name], DO   30 mL at 11/08/22 1012   [START ON 02/10/2023] metFORMIN (GLUCOPHAGE) tablet 1,000 mg  1,000 mg Oral Q breakfast [name], MD       oxybutynin (DITROPAN) tablet 5 mg  5 mg Oral BID [name], MD   5 mg at 02/09/23 0921   paliperidone (INVEGA SUSTENNA) injection 156 mg  156 mg Intramuscular Q28 days [name], MD   156 mg at 01/15/23 1501   traZODone (DESYREL) tablet 50 mg  50 mg Oral QHS PRN [name], DO   50 mg at 02/08/23 2037    Lab Results:  No results found for this or any previous visit (from the past 48 hour(s)).   Blood Alcohol level:  Lab Results  Component Value Date   ETH <10 08/17/2022   ETH <10 01/11/2021      Musculoskeletal: Strength & Muscle Tone: within normal limits Gait & Station: normal Patient leans: N/A   Psychiatric Specialty Exam:   Presentation  General Appearance:  Casual; Neat   Eye Contact: Fair   Speech: Spontaneous   Speech Volume: Normal    Handedness: Right     Mood and Affect  Mood: "Fine"   Affect: Stable and pleasant    Thought Processes: Improved, at baseline   Descriptions of Associations: Intact   Orientation: Well-oriented   Thought Content: Improved, at baseline  Hallucinations:Denies, occasionally heard talking to self in her room  Ideas of Reference:None  noted   Suicidal Thoughts:Denies SI, no intention or plan  Homicidal Thoughts:Denies HI, no intention or plan   Sensorium  Memory: Immediate Fair; Remote Poor   Judgment: Fair, pt is Rx compliant   Insight: Improved     Executive Functions  Concentration: Improved   Attention Span: Improved   Language: Fair     Psychomotor Activity  Psychomotor Activity: Normal  Assets  Assets: Communication Skills     Sleep  Sleep:Improved     Physical Exam: Physical Exam Vitals and nursing note reviewed.  Constitutional:      Appearance: Normal appearance. She is normal weight.  Neurological:     General: No focal deficit present.     Mental Status: She is alert.      Review of Systems  Constitutional: Negative.   HENT: Negative.    Eyes: Negative.   Respiratory: Negative.    Cardiovascular: Negative.   Gastrointestinal: Negative.   Genitourinary: Negative.   Musculoskeletal: Negative.   Skin: Negative.   Neurological: Negative.   Endo/Heme/Allergies: Negative.     Blood pressure 102/66, pulse 85, temperature (!) 97.5 F (36.4 C), resp. rate 16, height 5\' 6"  (1.676 m), weight (P) 82.8 kg, SpO2 99%. Body mass index is 29.46 kg/m (pended).   Treatment Plan Summary: Daily contact with patient to assess and evaluate symptoms and progress in treatment, Medication management, and Plan continue current medications.   Continue to monitor patient on as needed meds   Patient received  dose of Invega Sustenna 156 mg IM on  01/15/23 Oxybutynin 5 mg by mouth twice daily to help with urinary incontinence Continue on  Metformin and Amaryl.   Continue on atorvastatin for Mixed hyperlipidemia   Patient is awaiting placement    [name], MD

## 2023-02-09 NOTE — Progress Notes (Signed)
Patient is a voluntary admission to [facility] for SAD - Bipolar.  She continues to be calm, cooperative, pleasant with staff and peers. Patient had no complaints today with no prn's given.  She did participate in group today.  Ambulates with walker and performs own ADL's. Denies SI, HI, AVH, anxiety or depression.  Will continue to monitor.

## 2023-02-09 NOTE — Plan of Care (Signed)
  Problem: Education: Goal: Knowledge of General Education information will improve Description: Including pain rating scale, medication(s)/side effects and non-pharmacologic comfort measures Outcome: Progressing   Problem: Health Behavior/Discharge Planning: Goal: Ability to manage health-related needs will improve Outcome: Progressing   Problem: Clinical Measurements: Goal: Ability to maintain clinical measurements within normal limits will improve Outcome: Progressing Goal: Diagnostic test results will improve Outcome: Progressing   Problem: Nutrition: Goal: Adequate nutrition will be maintained Outcome: Progressing   Problem: Coping: Goal: Level of anxiety will decrease Outcome: Progressing   Problem: Elimination: Goal: Will not experience complications related to bowel motility Outcome: Progressing Goal: Will not experience complications related to urinary retention Outcome: Progressing   Problem: Pain Managment: Goal: General experience of comfort will improve Outcome: Progressing   Problem: Safety: Goal: Ability to remain free from injury will improve Outcome: Progressing   Problem: Skin Integrity: Goal: Risk for impaired skin integrity will decrease Outcome: Progressing

## 2023-02-09 NOTE — Group Note (Unsigned)
Date:  02/09/2023 Time:  10:02 AM  Group Topic/Focus:  Movement therapy     Participation Level:  {BHH PARTICIPATION LEVEL:22264}  Participation Quality:  {BHH PARTICIPATION QUALITY:22265}  Affect:  {BHH AFFECT:22266}  Cognitive:  {BHH COGNITIVE:22267}  Insight: {BHH Insight2:20797}  Engagement in Group:  {BHH ENGAGEMENT IN XNATF:57322}  Modes of Intervention:  {BHH MODES OF INTERVENTION:22269}  Additional Comments:  ***  Rodena Goldmann 02/09/2023, 10:02 AM

## 2023-02-09 NOTE — Progress Notes (Signed)
Patient observed lying in bed, resting at shift change. Patient ambulated to dayroom area for snack and wrap up group. Patient denies SI/HI/AVH and anxiety. Patient complains of pain in bilateral legs of (5). Patient med compliant and received PRN Tylenol 650 mg or leg pain, medication effective and Trazodone 50 mg PO for insomnia. Patient slept well throughout the night. VSS, no acute distress noted. Patient observations to continue to monitor safety.

## 2023-02-09 NOTE — Group Note (Unsigned)
Date:  02/09/2023 Time:  9:59 AM  Group Topic/Focus:  Movement Therapy     Participation Level:  {BHH PARTICIPATION NFAOZ:30865}  Participation Quality:  {BHH PARTICIPATION QUALITY:22265}  Affect:  {BHH AFFECT:22266}  Cognitive:  {BHH COGNITIVE:22267}  Insight: {BHH Insight2:20797}  Engagement in Group:  {BHH ENGAGEMENT IN HQION:62952}  Modes of Intervention:  {BHH MODES OF INTERVENTION:22269}  Additional Comments:  ***  Rodena Goldmann 02/09/2023, 9:59 AM

## 2023-02-09 NOTE — Group Note (Unsigned)
Date:  02/09/2023 Time:  10:04 AM  Group Topic/Focus:  Movement Therapy     Participation Level:  {BHH PARTICIPATION WUJWJ:19147}  Participation Quality:  {BHH PARTICIPATION QUALITY:22265}  Affect:  {BHH AFFECT:22266}  Cognitive:  {BHH COGNITIVE:22267}  Insight: {BHH Insight2:20797}  Engagement in Group:  {BHH ENGAGEMENT IN WGNFA:21308}  Modes of Intervention:  {BHH MODES OF INTERVENTION:22269}  Additional Comments:  ***  Rodena Goldmann 02/09/2023, 10:04 AM

## 2023-02-10 DIAGNOSIS — F250 Schizoaffective disorder, bipolar type: Secondary | ICD-10-CM | POA: Diagnosis not present

## 2023-02-10 LAB — GLUCOSE, CAPILLARY
Glucose-Capillary: 223 mg/dL — ABNORMAL HIGH (ref 70–99)
Glucose-Capillary: 274 mg/dL — ABNORMAL HIGH (ref 70–99)
Glucose-Capillary: 229 mg/dL — ABNORMAL HIGH (ref 70–99)

## 2023-02-10 NOTE — Progress Notes (Signed)
Patient observed lying in bed, resting at shift change. Patient ambulated to day room for a snack and wrap-up group. Patient denies SI/HI/AVH/ Anxiety and complains of bilat leg and knee pain (5/10). Patient medication compliant, received PRN Trazodone 50 mg PO for insomnia and Tylenol 650 mg PO for pain. VSS, no acute distress noted. Patient rested throughout the night. Routine observations to continue to monitor patient safety.

## 2023-02-10 NOTE — Plan of Care (Signed)
  Problem: Education: Goal: Knowledge of General Education information will improve Description: Including pain rating scale, medication(s)/side effects and non-pharmacologic comfort measures Outcome: Progressing   Problem: Health Behavior/Discharge Planning: Goal: Ability to manage health-related needs will improve Outcome: Progressing   Problem: Clinical Measurements: Goal: Ability to maintain clinical measurements within normal limits will improve Outcome: Progressing Goal: Diagnostic test results will improve Outcome: Progressing   Problem: Nutrition: Goal: Adequate nutrition will be maintained Outcome: Progressing   Problem: Coping: Goal: Level of anxiety will decrease Outcome: Progressing   Problem: Elimination: Goal: Will not experience complications related to bowel motility Outcome: Progressing Goal: Will not experience complications related to urinary retention Outcome: Progressing   Problem: Pain Managment: Goal: General experience of comfort will improve Outcome: Progressing   Problem: Safety: Goal: Ability to remain free from injury will improve Outcome: Progressing   Problem: Skin Integrity: Goal: Risk for impaired skin integrity will decrease Outcome: Progressing

## 2023-02-10 NOTE — Group Note (Signed)
Recreation Therapy Group Note   Group Topic:Relaxation  Group Date: 02/10/2023 Start Time: 1400 End Time: 1500 Facilitators: Rosina Lowenstein, LRT, CTRS Location: Courtyard  Group Description: Meditation. LRT and patients discussed what they know about meditation and mindfulness. LRT played a Deep Breathing Meditation exercise script for patients to follow along to. LRT and patients discussed how meditation and deep breathing can be used as a coping skill post--discharge.   Goal Area(s) Addressed: Patient will practice using relaxation technique. Patient will identify a new coping skill.  Patient will follow multistep directions to reduce anxiety and stress.   Affect/Mood: Appropriate   Participation Level: Active and Engaged   Participation Quality: Independent   Behavior: Appropriate, Calm, and Cooperative   Speech/Thought Process: Coherent   Insight: Fair   Judgement: Fair    Modes of Intervention: Activity   Patient Response to Interventions:  Receptive   Education Outcome:  Acknowledges education   Clinical Observations/Individualized Feedback: Margurete was active in their participation of session activities and group discussion. Pt identified minimally interacted with LRT and peers while in group. However, followed along to the prompt played.    Plan: Continue to engage patient in RT group sessions 2-3x/week.   Rosina Lowenstein, LRT, CTRS 02/10/2023 3:18 PM

## 2023-02-10 NOTE — Group Note (Signed)
Date:  02/10/2023 Time:  1:54 AM  Group Topic/Focus:  Goals Group:   The focus of this group is to help patients establish daily goals to achieve during treatment and discuss how the patient can incorporate goal setting into their daily lives to aide in recovery.    Participation Level:  Did Not Attend   Additional Comments:    Betty Henderson 02/10/2023, 1:54 AM

## 2023-02-10 NOTE — Progress Notes (Signed)
   02/10/23 0600  15 Minute Checks  Location Bedroom  Visual Appearance Calm  Behavior Sleeping  Sleep (Behavioral Health Patients Only)  Calculate sleep? (Click Yes once per 24 hr at 0600 safety check) Yes  Documented sleep last 24 hours 11.5

## 2023-02-10 NOTE — Plan of Care (Signed)
  Problem: Education: Goal: Knowledge of General Education information will improve Description: Including pain rating scale, medication(s)/side effects and non-pharmacologic comfort measures 02/10/2023 0524 by Inetta Fermo, RN Outcome: Progressing 02/10/2023 0524 by Inetta Fermo, RN Outcome: Progressing   Problem: Health Behavior/Discharge Planning: Goal: Ability to manage health-related needs will improve 02/10/2023 0524 by Inetta Fermo, RN Outcome: Progressing 02/10/2023 0524 by Inetta Fermo, RN Outcome: Progressing   Problem: Clinical Measurements: Goal: Ability to maintain clinical measurements within normal limits will improve 02/10/2023 0524 by Inetta Fermo, RN Outcome: Progressing 02/10/2023 0524 by Inetta Fermo, RN Outcome: Progressing Goal: Diagnostic test results will improve 02/10/2023 0524 by Inetta Fermo, RN Outcome: Progressing 02/10/2023 0524 by Inetta Fermo, RN Outcome: Progressing   Problem: Nutrition: Goal: Adequate nutrition will be maintained 02/10/2023 0524 by Inetta Fermo, RN Outcome: Progressing 02/10/2023 0524 by Inetta Fermo, RN Outcome: Progressing   Problem: Coping: Goal: Level of anxiety will decrease 02/10/2023 0524 by Inetta Fermo, RN Outcome: Progressing 02/10/2023 0524 by Inetta Fermo, RN Outcome: Progressing   Problem: Elimination: Goal: Will not experience complications related to bowel motility 02/10/2023 0524 by Inetta Fermo, RN Outcome: Progressing 02/10/2023 0524 by Inetta Fermo, RN Outcome: Progressing Goal: Will not experience complications related to urinary retention 02/10/2023 0524 by Inetta Fermo, RN Outcome: Progressing 02/10/2023 0524 by Inetta Fermo, RN Outcome: Progressing   Problem: Pain Managment: Goal: General experience of comfort will improve 02/10/2023 0524 by Inetta Fermo, RN Outcome: Progressing 02/10/2023 0524 by Inetta Fermo,  RN Outcome: Progressing   Problem: Safety: Goal: Ability to remain free from injury will improve 02/10/2023 0524 by Inetta Fermo, RN Outcome: Progressing 02/10/2023 0524 by Inetta Fermo, RN Outcome: Progressing   Problem: Skin Integrity: Goal: Risk for impaired skin integrity will decrease 02/10/2023 0524 by Inetta Fermo, RN Outcome: Progressing 02/10/2023 0524 by Inetta Fermo, RN Outcome: Progressing

## 2023-02-10 NOTE — Progress Notes (Signed)
[facility] MD Progress Note  Betty Henderson  MRN:  [id number]  Subjective: Case discussed in multidisciplinary meeting today, chart reviewed, patient seen today during rounds. Social worker is working with DSS on placement.  Social worker informed that patient might have court hearing for guardianship soon.  Patient reports she is doing "fine" today. No new acute issues overnight reported by the staff.  Patient was informed that DSS is working on getting guardianship.  Patient has been attending  few groups and participating in milieu.  She is interacting well with her peers.  Patient denies auditory or visual hallucinations.    Principal Problem: Schizoaffective disorder, bipolar type (HCC) Diagnosis: Principal Problem:   Schizoaffective disorder, bipolar type (HCC)   Past Psychiatric History: Schizoaffective disorder, bipolar type.  Past Medical History:  Past Medical History:  Diagnosis Date   Anemia    Arthritis    Chronic pain    Drug-seeking behavior    Malingering    Osteopetrosis    Psychosis (HCC)    Schizoaffective disorder, bipolar type (HCC)     Past Surgical History:  Procedure Laterality Date   MOUTH SURGERY     TUBAL LIGATION     Family History:  Family History  Family history unknown: Yes   Family Psychiatric  History: Unremarkable Social History:  Social History   Substance and Sexual Activity  Alcohol Use Not Currently   Comment: 1 cocktail 3 weeks ago     Social History   Substance and Sexual Activity  Drug Use Not Currently   Types: Cocaine   Comment: states "it's legal though"    Social History   Socioeconomic History   Marital status: Single    Spouse name: Not on file   Number of children: Not on file   Years of education: Not on file   Highest education level: Not on file  Occupational History   Not on file  Tobacco Use   Smoking status: Every Day    Current packs/day: 0.50    Types: Cigarettes   Smokeless tobacco: Never  Vaping  Use   Vaping status: Never Used  Substance and Sexual Activity   Alcohol use: Not Currently    Comment: 1 cocktail 3 weeks ago   Drug use: Not Currently    Types: Cocaine    Comment: states "it's legal though"   Sexual activity: Never  Other Topics Concern   Not on file  Social History Narrative   Not on file   Social Determinants of Health   Financial Resource Strain: Not on file  Food Insecurity: Food Insecurity Present (08/22/2022)   Hunger Vital Sign    Worried About Running Out of Food in the Last Year: Sometimes true    Ran Out of Food in the Last Year: Sometimes true  Transportation Needs: Patient Unable To Answer (08/22/2022)   PRAPARE - Transportation    Lack of Transportation (Medical): Patient unable to answer    Lack of Transportation (Non-Medical): Patient unable to answer  Recent Concern: Transportation Needs - Unmet Transportation Needs (06/13/2022)   Received from [facility], [facility]   [facility] - Transportation    Lack of Transportation (Medical): Not on file    Lack of Transportation (Non-Medical): Yes  Physical Activity: Not on file  Stress: Not on file  Social Connections: Unknown (08/06/2021)   Received from [facility], [facility]   Social Network    Social Network: Not on file   Additional Social History:  Patient is homeless at this time.  Social worker is working with DSS on [employer] application and  placement.                    Sleep Good  Appetite:  Good  Current Medications: Current Facility-Administered Medications  Medication Dose Route Frequency Provider Last Rate Last Admin   acetaminophen (TYLENOL) tablet 650 mg  650 mg Oral Q6H PRN [name], DO   650 mg at 02/10/23 2055   alum & mag hydroxide-simeth (MAALOX/MYLANTA) 200-200-20 MG/5ML suspension 30 mL  30 mL Oral Q4H PRN [name], DO   30 mL at 02/06/23 2308   aspirin EC tablet 81 mg  81 mg Oral Daily [name], MD   81 mg at  02/10/23 0803   atorvastatin (LIPITOR) tablet 20 mg  20 mg Oral Daily [name], MD   20 mg at 02/10/23 0803   diphenhydrAMINE (BENADRYL) capsule 50 mg  50 mg Oral Q6H PRN [name], DO   50 mg at 09/29/22 2254   Or   diphenhydrAMINE (BENADRYL) injection 50 mg  50 mg Intramuscular Q6H PRN [name], DO       glimepiride (AMARYL) tablet 4 mg  4 mg Oral Q breakfast [name], MD   4 mg at 02/10/23 8182   haloperidol (HALDOL) tablet 5 mg  5 mg Oral Q6H PRN [name], DO   5 mg at 09/28/22 2149   Or   haloperidol lactate (HALDOL) injection 5 mg  5 mg Intramuscular Q6H PRN [name], DO       ibuprofen (ADVIL) tablet 600 mg  600 mg Oral Q6H PRN [name], [name], MD   600 mg at 02/10/23 0803   LORazepam (ATIVAN) tablet 1 mg  1 mg Oral TID PRN [name], DO   1 mg at 01/23/23 2133   magnesium hydroxide (MILK OF MAGNESIA) suspension 30 mL  30 mL Oral Daily PRN [name], DO   30 mL at 11/08/22 1012   metFORMIN (GLUCOPHAGE) tablet 1,000 mg  1,000 mg Oral Q breakfast [name], MD   1,000 mg at 02/10/23 0803   oxybutynin (DITROPAN) tablet 5 mg  5 mg Oral BID [name], MD   5 mg at 02/10/23 2055   paliperidone (INVEGA SUSTENNA) injection 156 mg  156 mg Intramuscular Q28 days [name], MD   156 mg at 01/15/23 1501   traZODone (DESYREL) tablet 50 mg  50 mg Oral QHS PRN [name], DO   50 mg at 02/10/23 2055    Lab Results:  Results for orders placed or performed during the hospital encounter of 08/22/22 (from the past 48 hour(s))  Glucose, capillary     Status: Abnormal   Collection Time: 02/10/23  7:51 AM  Result Value Ref Range   Glucose-Capillary 223 (H) 70 - 99 mg/dL    Comment: Glucose reference range applies only to samples taken after fasting for at least 8 hours.  Glucose, capillary     Status: Abnormal   Collection Time: 02/10/23 11:38 AM  Result Value Ref Range    Glucose-Capillary 274 (H) 70 - 99 mg/dL    Comment: Glucose reference range applies only to samples taken after fasting for at least 8 hours.  Glucose, capillary     Status: Abnormal   Collection Time: 02/10/23  4:20 PM  Result Value Ref Range   Glucose-Capillary 229 (H)  70 - 99 mg/dL    Comment: Glucose reference range applies only to samples taken after fasting for at least 8 hours.     Blood Alcohol level:  Lab Results  Component Value Date   ETH <10 08/17/2022   ETH <10 01/11/2021      Musculoskeletal: Strength & Muscle Tone: within normal limits Gait & Station: normal Patient leans: N/A   Psychiatric Specialty Exam:   Presentation  General Appearance:  Casual; Neat   Eye Contact: Fair   Speech: Spontaneous   Speech Volume: Normal   Handedness: Right     Mood and Affect  Mood: "Fine"   Affect: Stable and pleasant    Thought Processes: Improved, at baseline   Descriptions of Associations: Intact   Orientation: Well-oriented   Thought Content: Improved, at baseline  Hallucinations:Denies, occasionally heard talking to self in her room  Ideas of Reference:None noted   Suicidal Thoughts:Denies SI, no intention or plan  Homicidal Thoughts:Denies HI, no intention or plan   Sensorium  Memory: Immediate Fair; Remote Poor   Judgment: Fair, pt is Rx compliant   Insight: Improved     Executive Functions  Concentration: Improved   Attention Span: Improved   Language: Fair     Psychomotor Activity  Psychomotor Activity: Normal  Assets  Assets: Communication Skills     Sleep  Sleep:Improved     Physical Exam: Physical Exam Vitals and nursing note reviewed.  Constitutional:      Appearance: Normal appearance. She is normal weight.  Neurological:     General: No focal deficit present.     Mental Status: She is alert.      Review of Systems  Constitutional: Negative.   HENT: Negative.    Eyes: Negative.    Respiratory: Negative.    Cardiovascular: Negative.   Gastrointestinal: Negative.   Genitourinary: Negative.   Musculoskeletal: Negative.   Skin: Negative.   Neurological: Negative.   Endo/Heme/Allergies: Negative.     Blood pressure (!) 107/57, pulse (!) 58, temperature 98.3 F (36.8 C), resp. rate 19, height 5\' 6"  (1.676 m), weight (P) 82.8 kg, SpO2 96%. Body mass index is 29.46 kg/m (pended).   Treatment Plan Summary: Daily contact with patient to assess and evaluate symptoms and progress in treatment, Medication management, and Plan continue current medications.   Continue to monitor patient on as needed meds   Patient received  dose of Invega Sustenna 156 mg IM on  01/15/23 Oxybutynin 5 mg by mouth twice daily to help with urinary incontinence Continue on Metformin and Amaryl.   Continue on atorvastatin for Mixed hyperlipidemia   Patient is awaiting placement    [name], MD

## 2023-02-10 NOTE — Progress Notes (Signed)
   02/10/23 0800  Psych Admission Type (Psych Patients Only)  Admission Status Voluntary  Psychosocial Assessment  Patient Complaints None  Eye Contact Fair  Facial Expression Sad;Sullen  Affect Depressed  Speech Logical/coherent  Interaction Minimal  Motor Activity Slow  Appearance/Hygiene Layered clothes  Behavior Characteristics Cooperative  Mood Pleasant  Thought Process  Coherency WDL  Content WDL  Delusions None reported or observed  Perception WDL  Hallucination None reported or observed  Judgment Impaired  Confusion None  Danger to Self  Current suicidal ideation? Denies  Danger to Others  Danger to Others None reported or observed  Danger to Others Abnormal  Harmful Behavior to others No threats or harm toward other people

## 2023-02-11 DIAGNOSIS — F250 Schizoaffective disorder, bipolar type: Secondary | ICD-10-CM | POA: Diagnosis not present

## 2023-02-11 NOTE — Group Note (Signed)
Recreation Therapy Group Note   Group Topic:Goal Setting  Group Date: 02/11/2023 Start Time: 1400 End Time: 1500 Facilitators: Rosina Lowenstein, LRT, CTRS Location:  Day Room  Group Description: Vision Boards. Patients were given many different magazines, a glue stick, markers, and a piece of cardstock paper. LRT and pts discussed the importance of having goals in life. LRT and pts discussed the difference between short-term and long-term goals, as well as what a SMART goal is. LRT encouraged pts to create a vision board, with images they picked and then cut out with safety scissors from the magazine, for themselves, that capture their short and long-term goals. LRT encouraged pts to show and explain their vision board to the group.   Goal Area(s) Addressed:  Patient will gain knowledge of short vs. long term goals.  Patient will identify goals for themselves. Patient will practice setting SMART goals. Patient will verbalize their goals to LRT and peers.   Affect/Mood: Appropriate   Participation Level: Active and Engaged   Participation Quality: Independent   Behavior: Appropriate, Calm, and Cooperative   Speech/Thought Process: Coherent   Insight: Good   Judgement: Good   Modes of Intervention: Art   Patient Response to Interventions:  Attentive, Engaged, Interested , and Receptive   Education Outcome:  Acknowledges education   Clinical Observations/Individualized Feedback: Betty Henderson was active in their participation of session activities and group discussion. Pt identified "I want summer time and a potluck" as her goals. Pt appropriately identified images to reflect these goals. Pt minimally interacted with LRT and peers while in group.    Plan: Continue to engage patient in RT group sessions 2-3x/week.   Rosina Lowenstein, LRT, CTRS 02/11/2023 3:34 PM

## 2023-02-11 NOTE — [facility] Counselor (Signed)
CSW contacted Betty Henderson DSS caseworker Betty Henderson to see if there are any updates at this time on timing of court date.   Camillie reports there are none.   CSW will update supervisors at DTP meeting  Reynaldo Minium, MSW, [facility] 02/11/2023 3:37 PM

## 2023-02-11 NOTE — Progress Notes (Signed)
   02/11/23 0937  Psych Admission Type (Psych Patients Only)  Admission Status Voluntary  Psychosocial Assessment  Patient Complaints None  Eye Contact Fair  Facial Expression Sad;Sullen  Affect Depressed  Speech Logical/coherent  Interaction Minimal  Motor Activity Slow  Appearance/Hygiene Layered clothes  Thought Process  Coherency WDL  Content WDL  Delusions None reported or observed  Perception WDL  Hallucination None reported or observed  Judgment Impaired  Confusion None  Danger to Self  Current suicidal ideation? Denies  Danger to Others  Danger to Others None reported or observed  Danger to Others Abnormal  Harmful Behavior to others No threats or harm toward other people

## 2023-02-11 NOTE — Progress Notes (Signed)
   02/11/23 0600  15 Minute Checks  Location Bedroom  Visual Appearance Calm  Behavior Sleeping  Sleep (Behavioral Health Patients Only)  Calculate sleep? (Click Yes once per 24 hr at 0600 safety check) Yes  Documented sleep last 24 hours 13.25

## 2023-02-11 NOTE — Plan of Care (Signed)
  Problem: Education: Goal: Knowledge of General Education information will improve Description: Including pain rating scale, medication(s)/side effects and non-pharmacologic comfort measures Outcome: Progressing   Problem: Health Behavior/Discharge Planning: Goal: Ability to manage health-related needs will improve Outcome: Progressing   Problem: Clinical Measurements: Goal: Ability to maintain clinical measurements within normal limits will improve Outcome: Progressing Goal: Diagnostic test results will improve Outcome: Progressing   Problem: Nutrition: Goal: Adequate nutrition will be maintained Outcome: Progressing   Problem: Coping: Goal: Level of anxiety will decrease Outcome: Progressing   Problem: Elimination: Goal: Will not experience complications related to bowel motility Outcome: Progressing Goal: Will not experience complications related to urinary retention Outcome: Progressing   Problem: Pain Managment: Goal: General experience of comfort will improve Outcome: Progressing   Problem: Safety: Goal: Ability to remain free from injury will improve Outcome: Progressing   Problem: Skin Integrity: Goal: Risk for impaired skin integrity will decrease Outcome: Progressing

## 2023-02-11 NOTE — Group Note (Signed)
Date:  02/11/2023 Time:  11:43 AM  Group Topic/Focus:  Daily goal setting/Crossword puzzle/Soothing music The purpose of this group is for patients to plan their daily goal and how will they accomplish their goal and who can they ask for help outside of the hospital. Also doing a hygiene crossword puzzle activity while listening to soothing music.    Participation Level:  Active  Participation Quality:  Appropriate  Affect:  Appropriate  Cognitive:  Appropriate  Insight: Appropriate  Engagement in Group:  Engaged  Modes of Intervention:  Activity and Discussion  Additional Comments:    [name] 02/11/2023, 11:43 AM

## 2023-02-11 NOTE — Progress Notes (Signed)
[facility] MD Progress Note  02/11/2023 11:55 AM Betty Henderson  MRN:  [id number] Subjective: Betty Henderson is seen on rounds.  She has no complaints.  Nurses report no issues.  Social work continues to work on housing. Principal Problem: Schizoaffective disorder, bipolar type (HCC) Diagnosis: Principal Problem:   Schizoaffective disorder, bipolar type (HCC)  Total Time spent with patient: 15 minutes  Past Psychiatric History: Unremarkable  Past Medical History:  Past Medical History:  Diagnosis Date   Anemia    Arthritis    Chronic pain    Drug-seeking behavior    Malingering    Osteopetrosis    Psychosis (HCC)    Schizoaffective disorder, bipolar type (HCC)     Past Surgical History:  Procedure Laterality Date   MOUTH SURGERY     TUBAL LIGATION     Family History:  Family History  Family history unknown: Yes   Family Psychiatric  History: Unremarkable Social History:  Social History   Substance and Sexual Activity  Alcohol Use Not Currently   Comment: 1 cocktail 3 weeks ago     Social History   Substance and Sexual Activity  Drug Use Not Currently   Types: Cocaine   Comment: states "it's legal though"    Social History   Socioeconomic History   Marital status: Single    Spouse name: Not on file   Number of children: Not on file   Years of education: Not on file   Highest education level: Not on file  Occupational History   Not on file  Tobacco Use   Smoking status: Every Day    Current packs/day: 0.50    Types: Cigarettes   Smokeless tobacco: Never  Vaping Use   Vaping status: Never Used  Substance and Sexual Activity   Alcohol use: Not Currently    Comment: 1 cocktail 3 weeks ago   Drug use: Not Currently    Types: Cocaine    Comment: states "it's legal though"   Sexual activity: Never  Other Topics Concern   Not on file  Social History Narrative   Not on file   Social Determinants of Health   Financial Resource Strain: Not on file  Food  Insecurity: Food Insecurity Present (08/22/2022)   Hunger Vital Sign    Worried About Running Out of Food in the Last Year: Sometimes true    Ran Out of Food in the Last Year: Sometimes true  Transportation Needs: Patient Unable To Answer (08/22/2022)   PRAPARE - Transportation    Lack of Transportation (Medical): Patient unable to answer    Lack of Transportation (Non-Medical): Patient unable to answer  Recent Concern: Transportation Needs - Unmet Transportation Needs (06/13/2022)   Received from [facility], [facility]   [facility] - Transportation    Lack of Transportation (Medical): Not on file    Lack of Transportation (Non-Medical): Yes  Physical Activity: Not on file  Stress: Not on file  Social Connections: Unknown (08/06/2021)   Received from [facility], [facility]   Social Network    Social Network: Not on file   Additional Social History:                         Sleep: Good  Appetite:  Good  Current Medications: Current Facility-Administered Medications  Medication Dose Route Frequency Provider Last Rate Last Admin   acetaminophen (TYLENOL) tablet 650 mg  650 mg Oral Q6H PRN [name], DO   650  mg at 02/11/23 0937   alum & mag hydroxide-simeth (MAALOX/MYLANTA) 200-200-20 MG/5ML suspension 30 mL  30 mL Oral Q4H PRN [name], DO   30 mL at 02/06/23 2308   aspirin EC tablet 81 mg  81 mg Oral Daily [name], MD   81 mg at 02/11/23 0936   atorvastatin (LIPITOR) tablet 20 mg  20 mg Oral Daily [name], MD   20 mg at 02/11/23 0936   diphenhydrAMINE (BENADRYL) capsule 50 mg  50 mg Oral Q6H PRN [name], DO   50 mg at 09/29/22 2254   Or   diphenhydrAMINE (BENADRYL) injection 50 mg  50 mg Intramuscular Q6H PRN [name], DO       glimepiride (AMARYL) tablet 4 mg  4 mg Oral Q breakfast [name], MD   4 mg at 02/11/23 2993   haloperidol (HALDOL) tablet 5 mg  5 mg Oral Q6H PRN [name], DO   5 mg at 09/28/22 2149   Or   haloperidol lactate (HALDOL) injection 5 mg  5 mg Intramuscular Q6H PRN [name], DO       ibuprofen (ADVIL) tablet 600 mg  600 mg Oral Q6H PRN [name], [name], MD   600 mg at 02/10/23 0803   LORazepam (ATIVAN) tablet 1 mg  1 mg Oral TID PRN [name], DO   1 mg at 01/23/23 2133   magnesium hydroxide (MILK OF MAGNESIA) suspension 30 mL  30 mL Oral Daily PRN [name], DO   30 mL at 11/08/22 1012   metFORMIN (GLUCOPHAGE) tablet 1,000 mg  1,000 mg Oral Q breakfast [name], MD   1,000 mg at 02/11/23 0936   oxybutynin (DITROPAN) tablet 5 mg  5 mg Oral BID [name], MD   5 mg at 02/11/23 0936   paliperidone (INVEGA SUSTENNA) injection 156 mg  156 mg Intramuscular Q28 days [name], MD   156 mg at 01/15/23 1501   traZODone (DESYREL) tablet 50 mg  50 mg Oral QHS PRN [name], DO   50 mg at 02/10/23 2055    Lab Results:  Results for orders placed or performed during the hospital encounter of 08/22/22 (from the past 48 hour(s))  Glucose, capillary     Status: Abnormal   Collection Time: 02/10/23  7:51 AM  Result Value Ref Range   Glucose-Capillary 223 (H) 70 - 99 mg/dL    Comment: Glucose reference range applies only to samples taken after fasting for at least 8 hours.  Glucose, capillary     Status: Abnormal   Collection Time: 02/10/23 11:38 AM  Result Value Ref Range   Glucose-Capillary 274 (H) 70 - 99 mg/dL    Comment: Glucose reference range applies only to samples taken after fasting for at least 8 hours.  Glucose, capillary     Status: Abnormal   Collection Time: 02/10/23  4:20 PM  Result Value Ref Range   Glucose-Capillary 229 (H) 70 - 99 mg/dL    Comment: Glucose reference range applies only to samples taken after fasting for at least 8 hours.    Blood Alcohol level:  Lab Results  Component Value Date   [facility] <10 08/17/2022   ETH <10 01/11/2021    Metabolic  Disorder Labs: Lab Results  Component Value Date   HGBA1C 11.0 (H) 01/27/2023   MPG 269 01/27/2023   No results found for: "PROLACTIN" Lab Results  Component  Value Date   CHOL 259 (H) 01/27/2023   TRIG 155 (H) 01/27/2023   HDL 65 01/27/2023   CHOLHDL 4.0 01/27/2023   VLDL 31 01/27/2023   LDLCALC 163 (H) 01/27/2023    Physical Findings: AIMS:  , ,  ,  ,    CIWA:    COWS:     Musculoskeletal: Strength & Muscle Tone: within normal limits Gait & Station: normal Patient leans: N/A  Psychiatric Specialty Exam:  Presentation  General Appearance:  Casual; Neat  Eye Contact: Fair  Speech: Slow; Slurred  Speech Volume: Decreased  Handedness: Right   Mood and Affect  Mood: Anxious  Affect: Inappropriate   Thought Process  Thought Processes: Disorganized  Descriptions of Associations:Loose  Orientation:Partial  Thought Content:Illogical; Rumination  History of Schizophrenia/Schizoaffective disorder:No data recorded Duration of Psychotic Symptoms:No data recorded Hallucinations:No data recorded Ideas of Reference:Paranoia  Suicidal Thoughts:No data recorded Homicidal Thoughts:No data recorded  Sensorium  Memory: Immediate Fair; Remote Poor  Judgment: Poor  Insight: Poor   Executive Functions  Concentration: Poor  Attention Span: Fair  Recall: [employer] of Knowledge: Fair  Language: Fair   Psychomotor Activity  Psychomotor Activity:No data recorded  Assets  Assets: Communication Skills   Sleep  Sleep:No data recorded    Blood pressure (!) 101/56, pulse 68, temperature 97.6 F (36.4 C), resp. rate 16, height 5\' 6"  (1.676 m), weight (P) 82.8 kg, SpO2 94%. Body mass index is 29.46 kg/m (pended).   Treatment Plan Summary: Daily contact with patient to assess and evaluate symptoms and progress in treatment, Medication management, and Plan continue current medications.  [name], DO 02/11/2023, 11:55  AM

## 2023-02-11 NOTE — Progress Notes (Signed)
Patient observed lying in bed, resting at shift change. Patient ambulated to the day room to receive a snack and then returned to her room. Patient answered assessment questions appropriately, denies SI/HI/AVH and anxiety. Patient complains of (L) leg pain and received PRN Tylenol 650 mg PO. Medication effective. Patient also received PRN Trazodone 50 mg PO for insomnia, and med compliant with scheduled meds. No acute distress noted. Routine obs to continue to monitor patient safety.

## 2023-02-12 DIAGNOSIS — F250 Schizoaffective disorder, bipolar type: Secondary | ICD-10-CM | POA: Diagnosis not present

## 2023-02-12 NOTE — Group Note (Signed)
Date:  02/12/2023 Time:  11:09 AM  Group Topic/Focus:  Wellness Toolbox:   The focus of this group is to discuss various aspects of wellness, balancing those aspects and exploring ways to increase the ability to experience wellness.  Patients will create a wellness toolbox for use upon discharge.    Participation Level:  Active  Participation Quality:  Appropriate  Affect:  Appropriate  Cognitive:  Appropriate  Insight: Appropriate  Engagement in Group:  Engaged  Modes of Intervention:  Activity and Discussion  Additional Comments:    [name] 02/12/2023, 11:09 AM

## 2023-02-12 NOTE — Plan of Care (Signed)
  Problem: Education: Goal: Knowledge of General Education information will improve Description: Including pain rating scale, medication(s)/side effects and non-pharmacologic comfort measures Outcome: Progressing   Problem: Health Behavior/Discharge Planning: Goal: Ability to manage health-related needs will improve Outcome: Progressing   Problem: Clinical Measurements: Goal: Ability to maintain clinical measurements within normal limits will improve Outcome: Progressing Goal: Diagnostic test results will improve Outcome: Progressing   Problem: Nutrition: Goal: Adequate nutrition will be maintained Outcome: Progressing   Problem: Coping: Goal: Level of anxiety will decrease Outcome: Progressing   Problem: Elimination: Goal: Will not experience complications related to bowel motility Outcome: Progressing Goal: Will not experience complications related to urinary retention Outcome: Progressing   Problem: Pain Managment: Goal: General experience of comfort will improve Outcome: Progressing   Problem: Safety: Goal: Ability to remain free from injury will improve Outcome: Progressing   Problem: Skin Integrity: Goal: Risk for impaired skin integrity will decrease Outcome: Progressing

## 2023-02-12 NOTE — BH IP Treatment Plan (Signed)
Interdisciplinary Treatment and Diagnostic Plan Update  02/12/2023 Time of Session: 9:30 AM  Braya Stiver MRN: [id number]  Principal Diagnosis: Schizoaffective disorder, bipolar type (HCC)  Secondary Diagnoses: Principal Problem:   Schizoaffective disorder, bipolar type (HCC)   Current Medications:  Current Facility-Administered Medications  Medication Dose Route Frequency Provider Last Rate Last Admin   acetaminophen (TYLENOL) tablet 650 mg  650 mg Oral Q6H PRN [name], DO   650 mg at 02/11/23 2049   alum & mag hydroxide-simeth (MAALOX/MYLANTA) 200-200-20 MG/5ML suspension 30 mL  30 mL Oral Q4H PRN [name], DO   30 mL at 02/06/23 2308   aspirin EC tablet 81 mg  81 mg Oral Daily [name], MD   81 mg at 02/12/23 0904   atorvastatin (LIPITOR) tablet 20 mg  20 mg Oral Daily [name], MD   20 mg at 02/12/23 1308   diphenhydrAMINE (BENADRYL) capsule 50 mg  50 mg Oral Q6H PRN [name], DO   50 mg at 09/29/22 2254   Or   diphenhydrAMINE (BENADRYL) injection 50 mg  50 mg Intramuscular Q6H PRN [name], DO       glimepiride (AMARYL) tablet 4 mg  4 mg Oral Q breakfast [name], MD   4 mg at 02/12/23 6578   haloperidol (HALDOL) tablet 5 mg  5 mg Oral Q6H PRN [name], DO   5 mg at 09/28/22 2149   Or   haloperidol lactate (HALDOL) injection 5 mg  5 mg Intramuscular Q6H PRN [name], DO       ibuprofen (ADVIL) tablet 600 mg  600 mg Oral Q6H PRN [name], [name], MD   600 mg at 02/10/23 0803   LORazepam (ATIVAN) tablet 1 mg  1 mg Oral TID PRN [name], DO   1 mg at 01/23/23 2133   magnesium hydroxide (MILK OF MAGNESIA) suspension 30 mL  30 mL Oral Daily PRN [name], DO   30 mL at 11/08/22 1012   metFORMIN (GLUCOPHAGE) tablet 1,000 mg  1,000 mg Oral Q breakfast [name], MD   1,000 mg at 02/12/23 0902   oxybutynin (DITROPAN) tablet 5 mg  5 mg Oral BID  [name], MD   5 mg at 02/12/23 0903   paliperidone (INVEGA SUSTENNA) injection 156 mg  156 mg Intramuscular Q28 days [name], MD   156 mg at 02/12/23 1010   traZODone (DESYREL) tablet 50 mg  50 mg Oral QHS PRN [name], DO   50 mg at 02/11/23 2049   PTA Medications: Medications Prior to Admission  Medication Sig Dispense Refill Last Dose   ondansetron (ZOFRAN) 4 MG tablet Take 1 tablet (4 mg total) by mouth every 8 (eight) hours as needed for nausea or vomiting. (Patient not taking: Reported on 08/17/2022) 20 tablet 0     Patient Stressors: Financial difficulties   Health problems   Medication change or noncompliance    Patient Strengths: Ability for insight   Treatment Modalities: Medication Management, Group therapy, Case management,  1 to 1 session with clinician, Psychoeducation, Recreational therapy.   Physician Treatment Plan for Primary Diagnosis: Schizoaffective disorder, bipolar type (HCC) Long Term Goal(s): Improvement in symptoms so as ready for discharge   Short Term Goals: Ability to identify changes in lifestyle to reduce recurrence of condition will improve Ability to verbalize feelings will improve Ability to disclose and discuss suicidal ideas Ability to demonstrate  self-control will improve Ability to identify and develop effective coping behaviors will improve Ability to maintain clinical measurements within normal limits will improve Compliance with prescribed medications will improve Ability to identify triggers associated with substance abuse/mental health issues will improve  Medication Management: Evaluate patient's response, side effects, and tolerance of medication regimen.  Therapeutic Interventions: 1 to 1 sessions, Unit Group sessions and Medication administration.  Evaluation of Outcomes: Progressing  Physician Treatment Plan for Secondary Diagnosis: Principal Problem:   Schizoaffective disorder, bipolar type  (HCC)  Long Term Goal(s): Improvement in symptoms so as ready for discharge   Short Term Goals: Ability to identify changes in lifestyle to reduce recurrence of condition will improve Ability to verbalize feelings will improve Ability to disclose and discuss suicidal ideas Ability to demonstrate self-control will improve Ability to identify and develop effective coping behaviors will improve Ability to maintain clinical measurements within normal limits will improve Compliance with prescribed medications will improve Ability to identify triggers associated with substance abuse/mental health issues will improve     Medication Management: Evaluate patient's response, side effects, and tolerance of medication regimen.  Therapeutic Interventions: 1 to 1 sessions, Unit Group sessions and Medication administration.  Evaluation of Outcomes: Progressing   RN Treatment Plan for Primary Diagnosis: Schizoaffective disorder, bipolar type (HCC) Long Term Goal(s): Knowledge of disease and therapeutic regimen to maintain health will improve  Short Term Goals: Ability to remain free from injury will improve, Ability to verbalize frustration and anger appropriately will improve, Ability to demonstrate self-control, Ability to participate in decision making will improve, Ability to verbalize feelings will improve, Ability to disclose and discuss suicidal ideas, Ability to identify and develop effective coping behaviors will improve, and Compliance with prescribed medications will improve  Medication Management: RN will administer medications as ordered by provider, will assess and evaluate patient's response and provide education to patient for prescribed medication. RN will report any adverse and/or side effects to prescribing provider.  Therapeutic Interventions: 1 on 1 counseling sessions, Psychoeducation, Medication administration, Evaluate responses to treatment, Monitor vital signs and CBGs as  ordered, Perform/monitor CIWA, COWS, AIMS and Fall Risk screenings as ordered, Perform wound care treatments as ordered.  Evaluation of Outcomes: Progressing   LCSW Treatment Plan for Primary Diagnosis: Schizoaffective disorder, bipolar type (HCC) Long Term Goal(s): Safe transition to appropriate next level of care at discharge, Engage patient in therapeutic group addressing interpersonal concerns.  Short Term Goals: Engage patient in aftercare planning with referrals and resources, Increase social support, Increase ability to appropriately verbalize feelings, Increase emotional regulation, Facilitate acceptance of mental health diagnosis and concerns, Facilitate patient progression through stages of change regarding substance use diagnoses and concerns, Identify triggers associated with mental health/substance abuse issues, and Increase skills for wellness and recovery  Therapeutic Interventions: Assess for all discharge needs, 1 to 1 time with Social worker, Explore available resources and support systems, Assess for adequacy in community support network, Educate family and significant other(s) on suicide prevention, Complete Psychosocial Assessment, Interpersonal group therapy.  Evaluation of Outcomes: Progressing   Progress in Treatment: Attending groups: Yes. and No. Participating in groups: Yes. and No. Taking medication as prescribed: Yes. Toleration medication: Yes. Family/Significant other contact made: Yes, individual(s) contacted:   Rachelanne Bassano, son, [phone]  Patient understands diagnosis: Yes. Discussing patient identified problems/goals with staff: Yes. Medical problems stabilized or resolved: Yes. Denies suicidal/homicidal ideation: Yes. Issues/concerns per patient self-inventory: No. Other: none   New problem(s) identified: No, Describe:  none Updates 12/12/22: No  changes made at this time Updates 12/17/22: No changes made at this time Update 12/24/22 No changes at  this time. Update 12/29/2022: No changes at this time. Update 01/03/23: No changes at this time Update 01/08/23: No changes at this time Update 01/13/23: No changes at this time Update 01/18/23: None at this time. Update 01/23/2023:  No changes at this time. Update 01/28/23: Pt recently diagnosed with Diabetes Type II  Update 02/02/23: None at this time. Update 02/07/23: No changes at this time Update 02/12/23: No changes at this time    New Short Term/Long Term Goal(s): Update 6/30: none at this time. Update 09/27/2022:  No changes at this time.  Update 10/02/2022:  No changes at this time. Update 10/07/22: No changes at this time 10/12/22: No changes at this time Update 10/18/22: No changes at this time Update 10/23/22: No changes at this time Update 10/28/22: No changes at this time Update 11/07/22: No changes at this time Update 11/12/22: No changes at this time  Update 11/17/22: None at this time. Update 11/22/22: None at this time. Update 11/27/22 No changes at this time. Update 12/02/22 No changes at this time  Update 12/07/22: None at this time. Updates 12/12/22: No changes made at this time Updates 12/17/22: No changes made at this time Update 12/24/22 No changes at this time. Update 12/29/2022: No changes at this time. Update 01/03/23: No changes at this time Update 01/08/23: No changes at this time Update 01/13/23: No changes at this time   Update 01/18/23: No changes at this time. Update 01/23/2023:  No changes at this time. Update 01/28/2023:  No changes at this time. Update 02/02/23: None at this time.  Update 02/07/23: No changes at this time Update 02/12/23: No changes at this time        Patient Goals:  Update 6/30: none at this time. Update 09/27/2022:  No changes at this time. Update 10/02/2022:  No changes at this time. Update 10/07/22: No changes at this time 10/12/22: No changes at this time Update 10/18/22: No changes at this time Update 10/23/22: No changes at this time Update 10/28/22: No changes at this time Update  11/07/22: No changes at this time Update 11/12/22: No changes at this time Update 11/17/22: None at this time. Update 11/22/22: None at this time. Update 11/27/22 No changes at this time Update 12/02/22 No changes at this time   Update 12/07/22: None at this time. Updates 12/12/22: No changes made at this time Updates 12/17/22: No changes made at this time Update 12/24/22 No changes at this time. Update 12/29/2022: No changes at this time. Update 01/03/23: No changes at this time Update 01/08/23: No changes at this time Update 01/13/23: No changes at this time   Update 01/18/23: No changes at this time. Update 01/23/2023:  No changes at this time.  Update 01/28/2023:  No changes at this time. Update 02/02/23: None at this time.  Update 02/07/23: No changes at this time Update 02/12/23: No changes at this time        Discharge Plan or Barriers: Update 6/30: APS report has been made and patient is being investigated for guardianship needs.  Remains homeless with limited supports. Update 09/27/2022:  No changes at this time.  Update 10/02/2022:  Patient remains safe on the unit at this time.  Patient remains psychotic at this time.  APS report has been made, however, no follow up from the caseworker on her case.  No safe discharge identified.  CSW has  requested that application for Medicaid and disability be completed.   Update 10/07/22: No changes at this time 10/12/22: No changes at this time Update 10/18/22: No changes at this time Update 10/23/22: No changes at this time Update 10/23/22: No changes at this time Update 10/28/22: According to [facility], pt's caseworker at DSS, the petition was filed for guardianship on 10/21/22, now awaiting for the petition to be approvedUpdate 11/07/22: No changes at this time Update 11/12/22: CSW sent over patients information to [facility] and [facility], awaiting a response. CSW awaiting word from DSS regarding pt's petition and what agency will become her guardian. Update 11/17/22: No changes  at this time. Update 11/22/22: CSW has sent patients information to multiple facilities that were suggested by leadership, pt has been denied from the facility or the facility does not respond. CSW continues to send pt's information to nursing homes. Update 11/27/22 CSW contacted DSS to inquire about the status of guardianship. No response at this time Update 12/02/22 Supervisor Loraine Leriche contacted DSS who stated they are not taking pt on for guardianship but that Quincy Carnes will be present tomorrow to do a visit with social worker and pt and that pt's son will be signing documentation for placement for the pt. Update 12/07/22: [facility] DSS has declined to take guardianship. Son, Casimiro Needle is to step into a more present role in deciding placement. A conversation is still needed. Updates 12/12/22: CSW staffed case with leadership and they state that a family meeting must happen with pt's son. CSW contacted pt's financial navigator and her DSS worker so that they may be present on the call and are able to speak to the efforts they have made to secure pt funding and housing. CSW waiting on responses from both, leadership is aware. Updates 12/17/22: CSW awaiting updates from both [facility] DSS and [facility] financial navigators as to status of pt's applications for Medicaid/[employer] and Disability, respectively. Update 12/24/22 CSW sent email regarding family meeting per request of supervisors. CSW awaiting responses from all parties to schedule family meeting. Update: 12/29/2022 No changes at this time. Update 01/03/23: CSW continues to await responses on the scheduling of the family meeting. CSW received call from Primitivo Gauze at [employer], CSW sent FL2 to [place] to aide in pt's discharge planning. Update 01/08/23: CSW has reached out again to leadership and financial counseling. CSW continues to await a response. CSW called Sharlee Blew, awaiting a response for status of pt's placement options. Update  01/13/23: Pt is to meet with psychologist tomorrow 10/22 to complete capacity testing so that DSS can retry for guardianship  Update 01/18/23: The patient is still awaiting placement.  Update 01/23/2023:  Patient remains safe on the unit at this time.  CSW team continues to work on placement.  CSW team in contact with DSS to develop plan on guardianship.   Work continues to Hospital doctor. Update 01/28/2023:  Medicaid application remains pending. DSS awaiting records from medical records to retry fo guardianship.  Update 02/02/23: None at this time.  Update 02/07/23: Sharlee Blew at DSS confirms that pt records were received and that they are petitioning for guardianship, she informs CSW that decision should be made on court date by Monday or Tuesday of next week. CSW still awaits status update on Medicaid application Update 02/12/23: Medicaid Application still pending. TOC Supervisors recommend that CSW continue searching for placement as we await DSS taking guardianship.        Reason for Continuation of  Hospitalization: Hallucinations Mania Medication stabilization   Estimated Length of Stay:  Update 6/30: 1-7 days Update 09/27/2022:  TBD Update 10/02/2022:  No changes at this time. UpdaUpdate 10/18/22: No changes at this time te 10/07/22: No changes at this time Update 10/18/22: No changes at this time Update 10/23/22: No changes at this time Update 10/28/22: No changes at this time Update 11/07/22: No changes at this time Update 11/12/22: No changes at this time  Update 11/17/22: No changes at this time. Update 11/22/22: None at this time. Update 11/27/22 No changes at this time. Update 12/02/22 No changes at this time  Update 12/07/22: None at this time. Updates 12/12/22: No changes made at this time Updates 12/17/22: TBD. Update 12/29/2022: No changes at this time. Update 01/03/23: TBDUpdate 01/08/23: TBD Update 01/13/23: TBD  Update 01/18/23: No changes at this time.  Update 01/23/2023:  TBD Update  01/28/2023:  TBD Update 02/02/23: None at this time. Update 02/07/23: TBD Update 02/12/23: TBD   Last 3 [place] Suicide Severity Risk Score: Flowsheet Row Admission (Current) from 08/22/2022 in [facility] from 08/17/2022 in [facility] at [facility] ED from 08/12/2022 in [facility] Department at [facility]  C-SSRS RISK CATEGORY Low Risk No Risk No Risk       Last [facility] 2/9 Scores:     No data to display          Scribe for Treatment Team: Laretta Alstrom 02/12/2023 12:42 PM

## 2023-02-12 NOTE — Progress Notes (Signed)
   02/12/23 0000  Psych Admission Type (Psych Patients Only)  Admission Status Voluntary  Psychosocial Assessment  Patient Complaints None  Eye Contact Fair  Facial Expression Sad  Affect Depressed;Sad  Speech Logical/coherent  Interaction Minimal  Motor Activity Slow  Appearance/Hygiene Layered clothes  Behavior Characteristics Cooperative;Appropriate to situation  Thought Process  Coherency WDL  Content WDL  Delusions None reported or observed  Perception WDL  Hallucination None reported or observed  Judgment Impaired  Confusion None  Danger to Self  Current suicidal ideation? Denies  Agreement Not to Harm Self Yes  Description of Agreement Verbal  Danger to Others  Danger to Others Reported or observed  Danger to Others Abnormal  Harmful Behavior to others No threats or harm toward other people  Destructive Behavior No threats or harm toward property

## 2023-02-12 NOTE — Group Note (Signed)
Recreation Therapy Group Note   Group Topic:Health and Wellness  Group Date: 02/12/2023 Start Time: 1400 End Time: 1450 Facilitators: Rosina Lowenstein, LRT, CTRS Location:  Day Room  Group Description: Seated Exercise. LRT discussed the mental and physical benefits of exercise. LRT and group discussed how physical activity can be used as a coping skill. Pt's and LRT followed along to an exercise video on the TV screen that provided a visual representation and audio description of every exercise performed. Pt's encouraged to listen to their bodies and stop at any time if they experience feelings of discomfort or pain. Pts were encouraged to drink water and stay hydrated.   Goal Area(s) Addressed: Patient will learn benefits of physical activity. Patient will identify exercise as a coping skill.  Patient will follow multistep directions. Patient will try a new leisure interest.    Affect/Mood: N/A   Participation Level: Did not attend    Clinical Observations/Individualized Feedback: Betty Henderson did not attend group.   Plan: Continue to engage patient in RT group sessions 2-3x/week.   Rosina Lowenstein, LRT, CTRS 02/12/2023 2:55 PM

## 2023-02-12 NOTE — Progress Notes (Signed)
[facility] MD Progress Note  02/12/2023 2:30 PM Betty Henderson  MRN:  [id number] Subjective: Genae is seen on rounds.  No new information.  Social work continues to look for placement.  She has been pleasant and cooperative and compliant. Principal Problem: Schizoaffective disorder, bipolar type (HCC) Diagnosis: Principal Problem:   Schizoaffective disorder, bipolar type (HCC)  Total Time spent with patient: 15 minutes  Past Psychiatric History: Schizoaffective disorder  Past Medical History:  Past Medical History:  Diagnosis Date   Anemia    Arthritis    Chronic pain    Drug-seeking behavior    Malingering    Osteopetrosis    Psychosis (HCC)    Schizoaffective disorder, bipolar type (HCC)     Past Surgical History:  Procedure Laterality Date   MOUTH SURGERY     TUBAL LIGATION     Family History:  Family History  Family history unknown: Yes   Family Psychiatric  History: Unremarkable Social History:  Social History   Substance and Sexual Activity  Alcohol Use Not Currently   Comment: 1 cocktail 3 weeks ago     Social History   Substance and Sexual Activity  Drug Use Not Currently   Types: Cocaine   Comment: states "it's legal though"    Social History   Socioeconomic History   Marital status: Single    Spouse name: Not on file   Number of children: Not on file   Years of education: Not on file   Highest education level: Not on file  Occupational History   Not on file  Tobacco Use   Smoking status: Every Day    Current packs/day: 0.50    Types: Cigarettes   Smokeless tobacco: Never  Vaping Use   Vaping status: Never Used  Substance and Sexual Activity   Alcohol use: Not Currently    Comment: 1 cocktail 3 weeks ago   Drug use: Not Currently    Types: Cocaine    Comment: states "it's legal though"   Sexual activity: Never  Other Topics Concern   Not on file  Social History Narrative   Not on file   Social Determinants of Health   Financial  Resource Strain: Not on file  Food Insecurity: Food Insecurity Present (08/22/2022)   Hunger Vital Sign    Worried About Running Out of Food in the Last Year: Sometimes true    Ran Out of Food in the Last Year: Sometimes true  Transportation Needs: Patient Unable To Answer (08/22/2022)   PRAPARE - Transportation    Lack of Transportation (Medical): Patient unable to answer    Lack of Transportation (Non-Medical): Patient unable to answer  Recent Concern: Transportation Needs - Unmet Transportation Needs (06/13/2022)   Received from [facility], [facility]   [facility] - Transportation    Lack of Transportation (Medical): Not on file    Lack of Transportation (Non-Medical): Yes  Physical Activity: Not on file  Stress: Not on file  Social Connections: Unknown (08/06/2021)   Received from [facility], [facility]   Social Network    Social Network: Not on file   Additional Social History:                         Sleep: Good  Appetite:  Good  Current Medications: Current Facility-Administered Medications  Medication Dose Route Frequency Provider Last Rate Last Admin   acetaminophen (TYLENOL) tablet 650 mg  650 mg Oral Q6H PRN [name],  DO   650 mg at 02/11/23 2049   alum & mag hydroxide-simeth (MAALOX/MYLANTA) 200-200-20 MG/5ML suspension 30 mL  30 mL Oral Q4H PRN [name], DO   30 mL at 02/06/23 2308   aspirin EC tablet 81 mg  81 mg Oral Daily [name], MD   81 mg at 02/12/23 1610   atorvastatin (LIPITOR) tablet 20 mg  20 mg Oral Daily [name], MD   20 mg at 02/12/23 9604   diphenhydrAMINE (BENADRYL) capsule 50 mg  50 mg Oral Q6H PRN [name], DO   50 mg at 09/29/22 2254   Or   diphenhydrAMINE (BENADRYL) injection 50 mg  50 mg Intramuscular Q6H PRN [name], DO       glimepiride (AMARYL) tablet 4 mg  4 mg Oral Q breakfast [name], MD   4 mg at 02/12/23 5409   haloperidol (HALDOL) tablet 5 mg   5 mg Oral Q6H PRN [name], DO   5 mg at 09/28/22 2149   Or   haloperidol lactate (HALDOL) injection 5 mg  5 mg Intramuscular Q6H PRN [name], DO       ibuprofen (ADVIL) tablet 600 mg  600 mg Oral Q6H PRN [name], [name], MD   600 mg at 02/10/23 0803   LORazepam (ATIVAN) tablet 1 mg  1 mg Oral TID PRN [name], DO   1 mg at 01/23/23 2133   magnesium hydroxide (MILK OF MAGNESIA) suspension 30 mL  30 mL Oral Daily PRN [name], DO   30 mL at 11/08/22 1012   metFORMIN (GLUCOPHAGE) tablet 1,000 mg  1,000 mg Oral Q breakfast [name], MD   1,000 mg at 02/12/23 0902   oxybutynin (DITROPAN) tablet 5 mg  5 mg Oral BID [name], MD   5 mg at 02/12/23 0903   paliperidone (INVEGA SUSTENNA) injection 156 mg  156 mg Intramuscular Q28 days [name], MD   156 mg at 02/12/23 1010   traZODone (DESYREL) tablet 50 mg  50 mg Oral QHS PRN [name], DO   50 mg at 02/11/23 2049    Lab Results:  Results for orders placed or performed during the hospital encounter of 08/22/22 (from the past 48 hour(s))  Glucose, capillary     Status: Abnormal   Collection Time: 02/10/23  4:20 PM  Result Value Ref Range   Glucose-Capillary 229 (H) 70 - 99 mg/dL    Comment: Glucose reference range applies only to samples taken after fasting for at least 8 hours.    Blood Alcohol level:  Lab Results  Component Value Date   ETH <10 08/17/2022   ETH <10 01/11/2021    Metabolic Disorder Labs: Lab Results  Component Value Date   HGBA1C 11.0 (H) 01/27/2023   MPG 269 01/27/2023   No results found for: "PROLACTIN" Lab Results  Component Value Date   CHOL 259 (H) 01/27/2023   TRIG 155 (H) 01/27/2023   HDL 65 01/27/2023   CHOLHDL 4.0 01/27/2023   VLDL 31 01/27/2023   LDLCALC 163 (H) 01/27/2023    Physical Findings: AIMS:  , ,  ,  ,    CIWA:    COWS:     Musculoskeletal: Strength & Muscle Tone: within normal limits Gait &  Station: normal Patient leans: N/A  Psychiatric Specialty Exam:  Presentation  General Appearance:  Casual; Neat  Eye Contact: Fair  Speech: Slow; Slurred  Speech Volume: Decreased  Handedness: Right   Mood and Affect  Mood: Anxious  Affect: Inappropriate   Thought Process  Thought Processes: Disorganized  Descriptions of Associations:Loose  Orientation:Partial  Thought Content:Illogical; Rumination  History of Schizophrenia/Schizoaffective disorder:No data recorded Duration of Psychotic Symptoms:No data recorded Hallucinations:No data recorded Ideas of Reference:Paranoia  Suicidal Thoughts:No data recorded Homicidal Thoughts:No data recorded  Sensorium  Memory: Immediate Fair; Remote Poor  Judgment: Poor  Insight: Poor   Executive Functions  Concentration: Poor  Attention Span: Fair  Recall: [employer] of Knowledge: Fair  Language: Fair   Psychomotor Activity  Psychomotor Activity:No data recorded  Assets  Assets: Communication Skills   Sleep  Sleep:No data recorded   Blood pressure (!) 119/57, pulse 69, temperature (!) 97.2 F (36.2 C), resp. rate 16, height 5\' 6"  (1.676 m), weight (P) 82.8 kg, SpO2 95%. Body mass index is 29.46 kg/m (pended).   Treatment Plan Summary: Daily contact with patient to assess and evaluate symptoms and progress in treatment, Medication management, and Plan continue current medications.  [name], DO 02/12/2023, 2:30 PM

## 2023-02-12 NOTE — Group Note (Signed)
[facility] LCSW Group Therapy Note    Group Date: 02/12/2023 Start Time: 1300 End Time: 1400  Type of Therapy and Topic:  Group Therapy:  Overcoming Obstacles  Participation Level:  B[facility] PARTICIPATION LEVEL: Did Not Attend  Mood:  Description of Group:   In this group patients will be encouraged to explore what they see as obstacles to their own wellness and recovery. They will be guided to discuss their thoughts, feelings, and behaviors related to these obstacles. The group will process together ways to cope with barriers, with attention given to specific choices patients can make. Each patient will be challenged to identify changes they are motivated to make in order to overcome their obstacles. This group will be process-oriented, with patients participating in exploration of their own experiences as well as giving and receiving support and challenge from other group members.  Therapeutic Goals: 1. Patient will identify personal and current obstacles as they relate to admission. 2. Patient will identify barriers that currently interfere with their wellness or overcoming obstacles.  3. Patient will identify feelings, thought process and behaviors related to these barriers. 4. Patient will identify two changes they are willing to make to overcome these obstacles:    Summary of Patient Progress   X   Therapeutic Modalities:   Cognitive Behavioral Therapy Solution Focused Therapy Motivational Interviewing Relapse Prevention Therapy   Elza Rafter, LCSWA

## 2023-02-12 NOTE — Progress Notes (Signed)
Patient is a voluntary admission to [facility] for SAD - Bipolar disorder.  She is calm, cooperative, pleasant with staff. Denies SI, HI, AVH, anxiety and depression.  Received her monthly Invega sustaina shot today and tolerated well. Ambulates with walker to and from room.  Ditropan seems to be helping with urgency and incontinence.  Will continue to monitor.

## 2023-02-13 DIAGNOSIS — F250 Schizoaffective disorder, bipolar type: Secondary | ICD-10-CM | POA: Diagnosis not present

## 2023-02-13 NOTE — Group Note (Signed)
Date:  02/13/2023 Time:  11:50 AM  Group Topic/Focus:  Building Self Esteem:   The Focus of this group is helping patients become aware of the effects of self-esteem on their lives, the things they and others do that enhance or undermine their self-esteem, seeing the relationship between their level of self-esteem and the choices they make and learning ways to enhance self-esteem. Coping With Mental Health Crisis:   The purpose of this group is to help patients identify strategies for coping with mental health crisis.  Group discusses possible causes of crisis and ways to manage them effectively. Managing Feelings:   The focus of this group is to identify what feelings patients have difficulty handling and develop a plan to handle them in a healthier way upon discharge.    Participation Level:  Active  Participation Quality:  Appropriate, Attentive, Sharing, and Supportive  Affect:  Appropriate  Cognitive:  Alert, Appropriate, and Oriented  Insight: Appropriate  Engagement in Group:  Engaged and Supportive  Modes of Intervention:  Discussion  Additional Comments:     Alexis Frock 02/13/2023, 11:50 AM

## 2023-02-13 NOTE — Group Note (Signed)
Date:  02/13/2023 Time:  8:44 PM  Group Topic/Focus:  Making Healthy Choices:   The focus of this group is to help patients identify negative/unhealthy choices they were using prior to admission and identify positive/healthier coping strategies to replace them upon discharge.    Participation Level:  Active  Participation Quality:  Appropriate  Affect:  Appropriate  Cognitive:  Appropriate  Insight: Good  Engagement in Group:  Engaged  Modes of Intervention:  Discussion  Additional Comments:    [name] 02/13/2023, 8:44 PM

## 2023-02-13 NOTE — Group Note (Unsigned)
Date:  02/13/2023 Time:  12:20 AM  Group Topic/Focus:  Building Self Esteem:   The Focus of this group is helping patients become aware of the effects of self-esteem on their lives, the things they and others do that enhance or undermine their self-esteem, seeing the relationship between their level of self-esteem and the choices they make and learning ways to enhance self-esteem.    Participation Level:  Minimal  Participation Quality:  Appropriate  Affect:  Appropriate  Cognitive:  Alert  Insight: Improving  Engagement in Group:  Improving  Modes of Intervention:  Discussion  Additional Comments:    [name] 02/13/2023, 12:20 AM

## 2023-02-13 NOTE — Group Note (Unsigned)
Date:  02/13/2023 Time:  8:36 PM  Group Topic/Focus:  Making Healthy Choices:   The focus of this group is to help patients identify negative/unhealthy choices they were using prior to admission and identify positive/healthier coping strategies to replace them upon discharge.     Participation Level:  {BHH PARTICIPATION ZOXWR:60454}  Participation Quality:  {BHH PARTICIPATION QUALITY:22265}  Affect:  {BHH AFFECT:22266}  Cognitive:  {BHH COGNITIVE:22267}  Insight: {BHH Insight2:20797}  Engagement in Group:  {BHH ENGAGEMENT IN UJWJX:91478}  Modes of Intervention:  {BHH MODES OF INTERVENTION:22269}  Additional Comments:  ***  [name] 02/13/2023, 8:36 PM

## 2023-02-13 NOTE — Group Note (Signed)
Recreation Therapy Group Note   Group Topic:Problem Solving  Group Date: 02/13/2023 Start Time: 1400 End Time: 1500 Facilitators: Rosina Lowenstein, LRT, CTRS Location:  Day Room  Group Description: Life Boat. Patients were given the scenario that they are on a boat that is about to become shipwrecked, leaving them stranded on an [place]. They are asked to make a list of 15 different items that they want to take with them when they are stranded on the [place]. Patients are asked to rank their items from most important to least important, #1 being the most important and #15 being the least. Patients will work individually for the first round to come up with 15 items and then pair up with a peer(s) to condense their list and come up with one list of 15 items between the two of them. Patients or LRT will read aloud the 15 different items to the group after each round. LRT facilitated post-activity processing to discuss how this activity can be used in daily life post discharge.   Goal Area(s) Addressed:  Patient will identify priorities, wants and needs. Patient will communicate with LRT and peers. Patient will work collectively as a Administrator, Civil Service. Patient will work on Product manager.    Affect/Mood: N/A   Participation Level: Did not attend    Clinical Observations/Individualized Feedback: Denesa did not attend group.  Plan: Continue to engage patient in RT group sessions 2-3x/week.   Rosina Lowenstein, LRT, CTRS 02/13/2023 3:28 PM

## 2023-02-13 NOTE — Progress Notes (Signed)
[facility] MD Progress Note  02/13/2023 12:30 PM Betty Henderson  MRN:  [id number] Subjective: Betty Henderson is seen on rounds.  No changes.  She has been in good controls.  Compliant with medications.  Social work has no new news. Principal Problem: Schizoaffective disorder, bipolar type (HCC) Diagnosis: Principal Problem:   Schizoaffective disorder, bipolar type (HCC)  Total Time spent with patient: 15 minutes  Past Psychiatric History: Schizoaffective disorder bipolar type  Past Medical History:  Past Medical History:  Diagnosis Date   Anemia    Arthritis    Chronic pain    Drug-seeking behavior    Malingering    Osteopetrosis    Psychosis (HCC)    Schizoaffective disorder, bipolar type (HCC)     Past Surgical History:  Procedure Laterality Date   MOUTH SURGERY     TUBAL LIGATION     Family History:  Family History  Family history unknown: Yes   Family Psychiatric  History: Unremarkable Social History:  Social History   Substance and Sexual Activity  Alcohol Use Not Currently   Comment: 1 cocktail 3 weeks ago     Social History   Substance and Sexual Activity  Drug Use Not Currently   Types: Cocaine   Comment: states "it's legal though"    Social History   Socioeconomic History   Marital status: Single    Spouse name: Not on file   Number of children: Not on file   Years of education: Not on file   Highest education level: Not on file  Occupational History   Not on file  Tobacco Use   Smoking status: Every Day    Current packs/day: 0.50    Types: Cigarettes   Smokeless tobacco: Never  Vaping Use   Vaping status: Never Used  Substance and Sexual Activity   Alcohol use: Not Currently    Comment: 1 cocktail 3 weeks ago   Drug use: Not Currently    Types: Cocaine    Comment: states "it's legal though"   Sexual activity: Never  Other Topics Concern   Not on file  Social History Narrative   Not on file   Social Determinants of Health   Financial  Resource Strain: Not on file  Food Insecurity: Food Insecurity Present (08/22/2022)   Hunger Vital Sign    Worried About Running Out of Food in the Last Year: Sometimes true    Ran Out of Food in the Last Year: Sometimes true  Transportation Needs: Patient Unable To Answer (08/22/2022)   PRAPARE - Transportation    Lack of Transportation (Medical): Patient unable to answer    Lack of Transportation (Non-Medical): Patient unable to answer  Recent Concern: Transportation Needs - Unmet Transportation Needs (06/13/2022)   Received from [facility], [facility]   [facility] - Transportation    Lack of Transportation (Medical): Not on file    Lack of Transportation (Non-Medical): Yes  Physical Activity: Not on file  Stress: Not on file  Social Connections: Unknown (08/06/2021)   Received from [facility], [facility]   Social Network    Social Network: Not on file   Additional Social History:                         Sleep: Good  Appetite:  Good  Current Medications: Current Facility-Administered Medications  Medication Dose Route Frequency Provider Last Rate Last Admin   acetaminophen (TYLENOL) tablet 650 mg  650 mg Oral Q6H PRN Marlou Porch,  Lanny Cramp, DO   650 mg at 02/12/23 2133   alum & mag hydroxide-simeth (MAALOX/MYLANTA) 200-200-20 MG/5ML suspension 30 mL  30 mL Oral Q4H PRN [name], DO   30 mL at 02/12/23 2219   aspirin EC tablet 81 mg  81 mg Oral Daily [name], MD   81 mg at 02/13/23 0910   atorvastatin (LIPITOR) tablet 20 mg  20 mg Oral Daily [name], MD   20 mg at 02/13/23 0910   diphenhydrAMINE (BENADRYL) capsule 50 mg  50 mg Oral Q6H PRN [name], DO   50 mg at 09/29/22 2254   Or   diphenhydrAMINE (BENADRYL) injection 50 mg  50 mg Intramuscular Q6H PRN [name], DO       glimepiride (AMARYL) tablet 4 mg  4 mg Oral Q breakfast [name], MD   4 mg at 02/13/23 9528   haloperidol (HALDOL) tablet 5 mg   5 mg Oral Q6H PRN [name], DO   5 mg at 09/28/22 2149   Or   haloperidol lactate (HALDOL) injection 5 mg  5 mg Intramuscular Q6H PRN [name], DO       ibuprofen (ADVIL) tablet 600 mg  600 mg Oral Q6H PRN [name], [name], MD   600 mg at 02/10/23 0803   LORazepam (ATIVAN) tablet 1 mg  1 mg Oral TID PRN [name], DO   1 mg at 01/23/23 2133   magnesium hydroxide (MILK OF MAGNESIA) suspension 30 mL  30 mL Oral Daily PRN [name], DO   30 mL at 11/08/22 1012   metFORMIN (GLUCOPHAGE) tablet 1,000 mg  1,000 mg Oral Q breakfast [name], MD   1,000 mg at 02/13/23 0910   oxybutynin (DITROPAN) tablet 5 mg  5 mg Oral BID [name], MD   5 mg at 02/13/23 0909   paliperidone (INVEGA SUSTENNA) injection 156 mg  156 mg Intramuscular Q28 days [name], MD   156 mg at 02/12/23 1010   traZODone (DESYREL) tablet 50 mg  50 mg Oral QHS PRN [name], DO   50 mg at 02/11/23 2049    Lab Results: No results found for this or any previous visit (from the past 48 hour(s)).  Blood Alcohol level:  Lab Results  Component Value Date   ETH <10 08/17/2022   ETH <10 01/11/2021    Metabolic Disorder Labs: Lab Results  Component Value Date   HGBA1C 11.0 (H) 01/27/2023   MPG 269 01/27/2023   No results found for: "PROLACTIN" Lab Results  Component Value Date   CHOL 259 (H) 01/27/2023   TRIG 155 (H) 01/27/2023   HDL 65 01/27/2023   CHOLHDL 4.0 01/27/2023   VLDL 31 01/27/2023   LDLCALC 163 (H) 01/27/2023    Physical Findings: AIMS:  , ,  ,  ,    CIWA:    COWS:     Musculoskeletal: Strength & Muscle Tone: within normal limits Gait & Station: normal Patient leans: N/A  Psychiatric Specialty Exam:  Presentation  General Appearance:  Casual; Neat  Eye Contact: Fair  Speech: Slow; Slurred  Speech Volume: Decreased  Handedness: Right   Mood and Affect   Mood: Anxious  Affect: Inappropriate   Thought Process  Thought Processes: Disorganized  Descriptions of Associations:Loose  Orientation:Partial  Thought Content:Illogical; Rumination  History of Schizophrenia/Schizoaffective disorder:No data recorded Duration of Psychotic Symptoms:No data recorded Hallucinations:No data recorded Ideas of  Reference:Paranoia  Suicidal Thoughts:No data recorded Homicidal Thoughts:No data recorded  Sensorium  Memory: Immediate Fair; Remote Poor  Judgment: Poor  Insight: Poor   Executive Functions  Concentration: Poor  Attention Span: Fair  Recall: [employer] of Knowledge: Fair  Language: Fair   Psychomotor Activity  Psychomotor Activity:No data recorded  Assets  Assets: Communication Skills   Sleep  Sleep:No data recorded    Blood pressure 99/67, pulse 66, temperature 98.2 F (36.8 C), resp. rate 18, height 5\' 6"  (1.676 m), weight (P) 82.8 kg, SpO2 97%. Body mass index is 29.46 kg/m (pended).   Treatment Plan Summary: Daily contact with patient to assess and evaluate symptoms and progress in treatment, Medication management, and Plan continue current medication.  [name], DO 02/13/2023, 12:30 PM

## 2023-02-13 NOTE — Progress Notes (Signed)

## 2023-02-13 NOTE — Progress Notes (Signed)
Patient is a voluntary admission to [facility] for SAD and bipolar.  There have been no changes with Aimee - she is calm cooperative and friendly with staff and peers. Participates in group activities. Patient is a newly diagnosed diabetic and on oral medications.  She denies SI, HI, AVH, anxiety and depression.  Will continue to monitor.

## 2023-02-14 DIAGNOSIS — F250 Schizoaffective disorder, bipolar type: Secondary | ICD-10-CM | POA: Diagnosis not present

## 2023-02-14 MED ORDER — GLIMEPIRIDE 4 MG PO TABS
8.0000 mg | ORAL_TABLET | Freq: Every day | ORAL | Status: DC
  Administered 2023-02-15 – 2023-03-28 (×42): 8 mg via ORAL
  Filled 2023-02-14 (×42): qty 2

## 2023-02-14 NOTE — Group Note (Signed)
Date:  02/14/2023 Time:  10:46 AM  Group Topic/Focus:  Overcoming Stress:   The focus of this group is to define stress and help patients assess their triggers.    Participation Level:  Active  Participation Quality:  Appropriate  Affect:  Appropriate  Cognitive:  Appropriate  Insight: Appropriate  Engagement in Group:  Engaged  Modes of Intervention:  Discussion   Betty Henderson 02/14/2023, 10:46 AM

## 2023-02-14 NOTE — Group Note (Signed)
Recreation Therapy Group Note   Group Topic:Coping Skills  Group Date: 02/14/2023 Start Time: 1400 End Time: 1450 Facilitators: Rosina Lowenstein, LRT, CTRS Location: Dayroom  Group Description: Meditation. LRT and patients discussed what they know about meditation and mindfulness. LRT played a Deep Breathing Meditation exercise script for patients to follow along to. LRT and patients discussed how meditation and deep breathing can be used as a coping skill post--discharge.   Goal Area(s) Addressed: Patient will practice using relaxation technique. Patient will identify a new coping skill.  Patient will follow multistep directions to reduce anxiety and stress.   Affect/Mood: Appropriate   Participation Level: Active and Engaged   Participation Quality: Independent   Behavior: Calm and Cooperative   Speech/Thought Process: Coherent   Insight: Fair   Judgement: Fair    Modes of Intervention: Activity, Education, and Exploration   Patient Response to Interventions:  Receptive   Education Outcome:  Acknowledges education   Clinical Observations/Individualized Feedback: Betty Henderson was active in their participation of session activities and group discussion. Pt was noted to be talking to herself briefly while in group. Pt interacted well with LRT and peers duration of session.    Plan: Continue to engage patient in RT group sessions 2-3x/week.   Rosina Lowenstein, LRT, CTRS 02/14/2023 3:01 PM

## 2023-02-14 NOTE — Progress Notes (Signed)
   02/14/23 0615  15 Minute Checks  Location Bedroom  Visual Appearance Calm  Behavior Sleeping  Sleep (Behavioral Health Patients Only)  Calculate sleep? (Click Yes once per 24 hr at 0600 safety check) Yes  Documented sleep last 24 hours 10

## 2023-02-14 NOTE — Plan of Care (Signed)
  Problem: Education: Goal: Knowledge of General Education information will improve Description: Including pain rating scale, medication(s)/side effects and non-pharmacologic comfort measures Outcome: Progressing   Problem: Health Behavior/Discharge Planning: Goal: Ability to manage health-related needs will improve Outcome: Progressing   Problem: Clinical Measurements: Goal: Ability to maintain clinical measurements within normal limits will improve Outcome: Progressing Goal: Diagnostic test results will improve Outcome: Progressing   Problem: Nutrition: Goal: Adequate nutrition will be maintained Outcome: Progressing   Problem: Coping: Goal: Level of anxiety will decrease Outcome: Progressing   Problem: Elimination: Goal: Will not experience complications related to bowel motility Outcome: Progressing Goal: Will not experience complications related to urinary retention Outcome: Progressing   Problem: Pain Managment: Goal: General experience of comfort will improve Outcome: Progressing   Problem: Safety: Goal: Ability to remain free from injury will improve Outcome: Progressing   Problem: Skin Integrity: Goal: Risk for impaired skin integrity will decrease Outcome: Progressing

## 2023-02-14 NOTE — Progress Notes (Signed)
   02/14/23 0952  Psych Admission Type (Psych Patients Only)  Admission Status Voluntary  Psychosocial Assessment  Patient Complaints None  Eye Contact Fair  Facial Expression Flat  Affect Depressed  Speech Logical/coherent  Interaction Minimal  Motor Activity Slow  Appearance/Hygiene Layered clothes  Behavior Characteristics Cooperative  Mood Pleasant  Thought Process  Coherency WDL  Content WDL  Delusions None reported or observed  Perception WDL  Hallucination None reported or observed  Judgment Impaired  Confusion None  Danger to Self  Current suicidal ideation? Denies  Danger to Others  Danger to Others None reported or observed  Danger to Others Abnormal  Harmful Behavior to others No threats or harm toward other people

## 2023-02-14 NOTE — Progress Notes (Signed)
[facility] MD Progress Note  02/14/2023 12:17 PM Betty Henderson  MRN:  [id number] Subjective: Betty Henderson is seen on rounds.  She has no complaints.  Nurses report no issues.  Social work has no news.  She continues to be a placement problem. Principal Problem: Schizoaffective disorder, bipolar type (HCC) Diagnosis: Principal Problem:   Schizoaffective disorder, bipolar type (HCC)  Total Time spent with patient: 15 minutes  Past Psychiatric History: Schizoaffective disorder and polysubstance abuse  Past Medical History:  Past Medical History:  Diagnosis Date   Anemia    Arthritis    Chronic pain    Drug-seeking behavior    Malingering    Osteopetrosis    Psychosis (HCC)    Schizoaffective disorder, bipolar type (HCC)     Past Surgical History:  Procedure Laterality Date   MOUTH SURGERY     TUBAL LIGATION     Family History:  Family History  Family history unknown: Yes   Family Psychiatric  History: Unremarkable Social History:  Social History   Substance and Sexual Activity  Alcohol Use Not Currently   Comment: 1 cocktail 3 weeks ago     Social History   Substance and Sexual Activity  Drug Use Not Currently   Types: Cocaine   Comment: states "it's legal though"    Social History   Socioeconomic History   Marital status: Single    Spouse name: Not on file   Number of children: Not on file   Years of education: Not on file   Highest education level: Not on file  Occupational History   Not on file  Tobacco Use   Smoking status: Every Day    Current packs/day: 0.50    Types: Cigarettes   Smokeless tobacco: Never  Vaping Use   Vaping status: Never Used  Substance and Sexual Activity   Alcohol use: Not Currently    Comment: 1 cocktail 3 weeks ago   Drug use: Not Currently    Types: Cocaine    Comment: states "it's legal though"   Sexual activity: Never  Other Topics Concern   Not on file  Social History Narrative   Not on file   Social Determinants of  Health   Financial Resource Strain: Not on file  Food Insecurity: Food Insecurity Present (08/22/2022)   Hunger Vital Sign    Worried About Running Out of Food in the Last Year: Sometimes true    Ran Out of Food in the Last Year: Sometimes true  Transportation Needs: Patient Unable To Answer (08/22/2022)   PRAPARE - Transportation    Lack of Transportation (Medical): Patient unable to answer    Lack of Transportation (Non-Medical): Patient unable to answer  Recent Concern: Transportation Needs - Unmet Transportation Needs (06/13/2022)   Received from [facility], [facility]   [facility] - Transportation    Lack of Transportation (Medical): Not on file    Lack of Transportation (Non-Medical): Yes  Physical Activity: Not on file  Stress: Not on file  Social Connections: Unknown (08/06/2021)   Received from [facility], [facility]   Social Network    Social Network: Not on file   Additional Social History:                         Sleep: Good  Appetite:  Good  Current Medications: Current Facility-Administered Medications  Medication Dose Route Frequency Provider Last Rate Last Admin   acetaminophen (TYLENOL) tablet 650 mg  650 mg  Oral Q6H PRN [name], DO   650 mg at 02/12/23 2133   alum & mag hydroxide-simeth (MAALOX/MYLANTA) 200-200-20 MG/5ML suspension 30 mL  30 mL Oral Q4H PRN [name], DO   30 mL at 02/12/23 2219   aspirin EC tablet 81 mg  81 mg Oral Daily [name], MD   81 mg at 02/14/23 1610   atorvastatin (LIPITOR) tablet 20 mg  20 mg Oral Daily [name], MD   20 mg at 02/14/23 9604   diphenhydrAMINE (BENADRYL) capsule 50 mg  50 mg Oral Q6H PRN [name], DO   50 mg at 09/29/22 2254   Or   diphenhydrAMINE (BENADRYL) injection 50 mg  50 mg Intramuscular Q6H PRN [name], DO       [START ON 02/15/2023] glimepiride (AMARYL) tablet 8 mg  8 mg Oral Q breakfast [name], MD       haloperidol  (HALDOL) tablet 5 mg  5 mg Oral Q6H PRN [name], DO   5 mg at 09/28/22 2149   Or   haloperidol lactate (HALDOL) injection 5 mg  5 mg Intramuscular Q6H PRN [name], DO       ibuprofen (ADVIL) tablet 600 mg  600 mg Oral Q6H PRN [name], [name], MD   600 mg at 02/14/23 5409   LORazepam (ATIVAN) tablet 1 mg  1 mg Oral TID PRN [name], DO   1 mg at 01/23/23 2133   magnesium hydroxide (MILK OF MAGNESIA) suspension 30 mL  30 mL Oral Daily PRN [name], DO   30 mL at 11/08/22 1012   metFORMIN (GLUCOPHAGE) tablet 1,000 mg  1,000 mg Oral Q breakfast [name], MD   1,000 mg at 02/14/23 8119   oxybutynin (DITROPAN) tablet 5 mg  5 mg Oral BID [name], MD   5 mg at 02/14/23 0952   paliperidone (INVEGA SUSTENNA) injection 156 mg  156 mg Intramuscular Q28 days [name], MD   156 mg at 02/12/23 1010   traZODone (DESYREL) tablet 50 mg  50 mg Oral QHS PRN [name], DO   50 mg at 02/13/23 2139    Lab Results: No results found for this or any previous visit (from the past 48 hour(s)).  Blood Alcohol level:  Lab Results  Component Value Date   ETH <10 08/17/2022   ETH <10 01/11/2021    Metabolic Disorder Labs: Lab Results  Component Value Date   HGBA1C 11.0 (H) 01/27/2023   MPG 269 01/27/2023   No results found for: "PROLACTIN" Lab Results  Component Value Date   CHOL 259 (H) 01/27/2023   TRIG 155 (H) 01/27/2023   HDL 65 01/27/2023   CHOLHDL 4.0 01/27/2023   VLDL 31 01/27/2023   LDLCALC 163 (H) 01/27/2023    Physical Findings: AIMS:  , ,  ,  ,    CIWA:    COWS:     Musculoskeletal: Strength & Muscle Tone: within normal limits Gait & Station: normal Patient leans: N/A  Psychiatric Specialty Exam:  Presentation  General Appearance:  Casual; Neat  Eye Contact: Fair  Speech: Slow; Slurred  Speech Volume: Decreased  Handedness: Right   Mood and Affect   Mood: Anxious  Affect: Inappropriate   Thought Process  Thought Processes: Disorganized  Descriptions of Associations:Loose  Orientation:Partial  Thought Content:Illogical; Rumination  History of Schizophrenia/Schizoaffective disorder:No data recorded Duration of Psychotic Symptoms:No data recorded Hallucinations:No  data recorded Ideas of Reference:Paranoia  Suicidal Thoughts:No data recorded Homicidal Thoughts:No data recorded  Sensorium  Memory: Immediate Fair; Remote Poor  Judgment: Poor  Insight: Poor   Executive Functions  Concentration: Poor  Attention Span: Fair  Recall: [employer] of Knowledge: Fair  Language: Fair   Psychomotor Activity  Psychomotor Activity:No data recorded  Assets  Assets: Communication Skills   Sleep  Sleep:No data recorded    Blood pressure (!) 122/53, pulse 61, temperature (!) 97.4 F (36.3 C), resp. rate 16, height 5\' 6"  (1.676 m), weight (P) 82.8 kg, SpO2 99%. Body mass index is 29.46 kg/m (pended).   Treatment Plan Summary: Daily contact with patient to assess and evaluate symptoms and progress in treatment, Medication management, and Plan continue current medication.  [name], DO 02/14/2023, 12:17 PM

## 2023-02-14 NOTE — Group Note (Signed)
Date:  02/14/2023 Time:  9:10 PM  Group Topic/Focus:  Developing a Wellness Toolbox:   The focus of this group is to help patients develop a "wellness toolbox" with skills and strategies to promote recovery upon discharge.    Participation Level:  Did Not Attend  Participation Quality:   Did Not Attend  Affect:   Did Not Attend  Cognitive:   Did Not Attend  Insight: None  Engagement in Group:  None  Modes of Intervention:   Did Not Attend  Additional Comments:    [name] 02/14/2023, 9:10 PM

## 2023-02-14 NOTE — Plan of Care (Signed)
Pt received in bed with eyes open. RR even and unlabored. Calm and cooperative. Flat affect. Isolates in room except for meds and meals. Limited interaction with peers and staff.  Po meds given as scheduled. Tol well. Denies anxiety and depression. Denies SI, HI, AVH, and pain.  Q 15 min checks maintained provided. Patient remains safe at this time.  Problem: Education: Goal: Knowledge of General Education information will improve Description: Including pain rating scale, medication(s)/side effects and non-pharmacologic comfort measures Outcome: Progressing   Problem: Nutrition: Goal: Adequate nutrition will be maintained Outcome: Progressing   Problem: Coping: Goal: Level of anxiety will decrease Outcome: Progressing   Problem: Pain Managment: Goal: General experience of comfort will improve Outcome: Progressing   Problem: Safety: Goal: Ability to remain free from injury will improve Outcome: Progressing   Problem: Skin Integrity: Goal: Risk for impaired skin integrity will decrease Outcome: Progressing

## 2023-02-15 DIAGNOSIS — F250 Schizoaffective disorder, bipolar type: Secondary | ICD-10-CM | POA: Diagnosis not present

## 2023-02-15 NOTE — Plan of Care (Signed)
 ?  Problem: Coping: ?Goal: Level of anxiety will decrease ?Outcome: Progressing ?  ?Problem: Safety: ?Goal: Ability to remain free from injury will improve ?Outcome: Progressing ?  ?

## 2023-02-15 NOTE — Progress Notes (Signed)
[facility] MD Progress Note  02/15/2023 12:57 PM Betty Henderson  MRN:  [id number] Subjective: [place] is seen on rounds.  No changes.  Nurses report no problems.  She has no complaints.  She has been in good controls on the unit. Principal Problem: Schizoaffective disorder, bipolar type (HCC) Diagnosis: Principal Problem:   Schizoaffective disorder, bipolar type (HCC)  Total Time spent with patient: 15 minutes  Past Psychiatric History: Schizoaffective disorder and polysubstance abuse  Past Medical History:  Past Medical History:  Diagnosis Date   Anemia    Arthritis    Chronic pain    Drug-seeking behavior    Malingering    Osteopetrosis    Psychosis (HCC)    Schizoaffective disorder, bipolar type (HCC)     Past Surgical History:  Procedure Laterality Date   MOUTH SURGERY     TUBAL LIGATION     Family History:  Family History  Family history unknown: Yes   Family Psychiatric  History: Unremarkable Social History:  Social History   Substance and Sexual Activity  Alcohol Use Not Currently   Comment: 1 cocktail 3 weeks ago     Social History   Substance and Sexual Activity  Drug Use Not Currently   Types: Cocaine   Comment: states "it's legal though"    Social History   Socioeconomic History   Marital status: Single    Spouse name: Not on file   Number of children: Not on file   Years of education: Not on file   Highest education level: Not on file  Occupational History   Not on file  Tobacco Use   Smoking status: Every Day    Current packs/day: 0.50    Types: Cigarettes   Smokeless tobacco: Never  Vaping Use   Vaping status: Never Used  Substance and Sexual Activity   Alcohol use: Not Currently    Comment: 1 cocktail 3 weeks ago   Drug use: Not Currently    Types: Cocaine    Comment: states "it's legal though"   Sexual activity: Never  Other Topics Concern   Not on file  Social History Narrative   Not on file   Social Determinants of Health    Financial Resource Strain: Not on file  Food Insecurity: Food Insecurity Present (08/22/2022)   Hunger Vital Sign    Worried About Running Out of Food in the Last Year: Sometimes true    Ran Out of Food in the Last Year: Sometimes true  Transportation Needs: Patient Unable To Answer (08/22/2022)   PRAPARE - Transportation    Lack of Transportation (Medical): Patient unable to answer    Lack of Transportation (Non-Medical): Patient unable to answer  Recent Concern: Transportation Needs - Unmet Transportation Needs (06/13/2022)   Received from [facility], [facility]   [facility] - Transportation    Lack of Transportation (Medical): Not on file    Lack of Transportation (Non-Medical): Yes  Physical Activity: Not on file  Stress: Not on file  Social Connections: Unknown (08/06/2021)   Received from [facility], [facility]   Social Network    Social Network: Not on file   Additional Social History:                         Sleep: Good  Appetite:  Good  Current Medications: Current Facility-Administered Medications  Medication Dose Route Frequency Provider Last Rate Last Admin   acetaminophen (TYLENOL) tablet 650 mg  650 mg Oral  Q6H PRN [name], DO   650 mg at 02/12/23 2133   alum & mag hydroxide-simeth (MAALOX/MYLANTA) 200-200-20 MG/5ML suspension 30 mL  30 mL Oral Q4H PRN [name], DO   30 mL at 02/14/23 1901   aspirin EC tablet 81 mg  81 mg Oral Daily [name], MD   81 mg at 02/15/23 4098   atorvastatin (LIPITOR) tablet 20 mg  20 mg Oral Daily [name], MD   20 mg at 02/15/23 1191   diphenhydrAMINE (BENADRYL) capsule 50 mg  50 mg Oral Q6H PRN [name], DO   50 mg at 09/29/22 2254   Or   diphenhydrAMINE (BENADRYL) injection 50 mg  50 mg Intramuscular Q6H PRN [name], DO       glimepiride (AMARYL) tablet 8 mg  8 mg Oral Q breakfast [name], MD   8 mg at 02/15/23 4782   haloperidol (HALDOL)  tablet 5 mg  5 mg Oral Q6H PRN [name], DO   5 mg at 09/28/22 2149   Or   haloperidol lactate (HALDOL) injection 5 mg  5 mg Intramuscular Q6H PRN [name], DO       ibuprofen (ADVIL) tablet 600 mg  600 mg Oral Q6H PRN [name], [name], MD   600 mg at 02/14/23 9562   LORazepam (ATIVAN) tablet 1 mg  1 mg Oral TID PRN [name], DO   1 mg at 01/23/23 2133   magnesium hydroxide (MILK OF MAGNESIA) suspension 30 mL  30 mL Oral Daily PRN [name], DO   30 mL at 11/08/22 1012   metFORMIN (GLUCOPHAGE) tablet 1,000 mg  1,000 mg Oral Q breakfast [name], MD   1,000 mg at 02/15/23 0942   oxybutynin (DITROPAN) tablet 5 mg  5 mg Oral BID [name], MD   5 mg at 02/15/23 0942   paliperidone (INVEGA SUSTENNA) injection 156 mg  156 mg Intramuscular Q28 days [name], MD   156 mg at 02/12/23 1010   traZODone (DESYREL) tablet 50 mg  50 mg Oral QHS PRN [name], DO   50 mg at 02/14/23 2149    Lab Results: No results found for this or any previous visit (from the past 48 hour(s)).  Blood Alcohol level:  Lab Results  Component Value Date   ETH <10 08/17/2022   ETH <10 01/11/2021    Metabolic Disorder Labs: Lab Results  Component Value Date   HGBA1C 11.0 (H) 01/27/2023   MPG 269 01/27/2023   No results found for: "PROLACTIN" Lab Results  Component Value Date   CHOL 259 (H) 01/27/2023   TRIG 155 (H) 01/27/2023   HDL 65 01/27/2023   CHOLHDL 4.0 01/27/2023   VLDL 31 01/27/2023   LDLCALC 163 (H) 01/27/2023    Physical Findings: AIMS:  , ,  ,  ,    CIWA:    COWS:     Musculoskeletal: Strength & Muscle Tone: within normal limits Gait & Station: normal Patient leans: N/A  Psychiatric Specialty Exam:  Presentation  General Appearance:  Casual; Neat  Eye Contact: Fair  Speech: Slow; Slurred  Speech Volume: Decreased  Handedness: Right   Mood and Affect   Mood: Anxious  Affect: Inappropriate   Thought Process  Thought Processes: Disorganized  Descriptions of Associations:Loose  Orientation:Partial  Thought Content:Illogical; Rumination  History of Schizophrenia/Schizoaffective disorder:No data recorded Duration of Psychotic Symptoms:No data recorded Hallucinations:No data  recorded Ideas of Reference:Paranoia  Suicidal Thoughts:No data recorded Homicidal Thoughts:No data recorded  Sensorium  Memory: Immediate Fair; Remote Poor  Judgment: Poor  Insight: Poor   Executive Functions  Concentration: Poor  Attention Span: Fair  Recall: [employer] of Knowledge: Fair  Language: Fair   Psychomotor Activity  Psychomotor Activity:No data recorded  Assets  Assets: Communication Skills   Sleep  Sleep:No data recorded    Blood pressure 117/75, pulse 67, temperature 97.8 F (36.6 C), resp. rate 20, height 5\' 6"  (1.676 m), weight (P) 82.8 kg, SpO2 99%. Body mass index is 29.46 kg/m (pended).   Treatment Plan Summary: Daily contact with patient to assess and evaluate symptoms and progress in treatment, Medication management, and Plan continue current medications.  [name], DO 02/15/2023, 12:57 PM

## 2023-02-15 NOTE — Plan of Care (Signed)
Calm and cooperative. Flat affect. Isolates in room except for meds/meals. Po meds given as scheduled.  Denies anxiety and depression. No behavior issues noted. Q 15 min checks maintained for safety. No c/o pain/discomfort noted.   Problem: Education: Goal: Knowledge of General Education information will improve Description: Including pain rating scale, medication(s)/side effects and non-pharmacologic comfort measures Outcome: Progressing   Problem: Nutrition: Goal: Adequate nutrition will be maintained Outcome: Progressing   Problem: Coping: Goal: Level of anxiety will decrease Outcome: Progressing   Problem: Elimination: Goal: Will not experience complications related to bowel motility Outcome: Progressing Goal: Will not experience complications related to urinary retention Outcome: Progressing   Problem: Pain Managment: Goal: General experience of comfort will improve Outcome: Progressing

## 2023-02-15 NOTE — Group Note (Signed)
LCSW Group Therapy Note   Group Date: 02/15/2023 Start Time: 1310 End Time: 1340   Type of Therapy and Topic:  Group Therapy: Geriatric Depression  Participation Level:  Minimal   Summary of Patient Progress:    Patient was able to participate on a limited level, it was difficult to understand what she was saying.  Therapeutic Modalities:   Wilson Singer 02/15/2023  4:38 PM

## 2023-02-15 NOTE — Progress Notes (Signed)
 Patient pleasant and cooperative.  Flat affect.  Denies SI/HI and VH.  Observed responding to internal stimuli.  Denies anxiety and depression.  Denies pain. Reports she slept well.   Compliant with scheduled medications. 15 min checks in place for safety.  Patient isolates to room with exception if meals.  Minimal interaction with peers and staff.  Participated in SW group.    PRN mediation given for heartburn x 1.

## 2023-02-16 DIAGNOSIS — F250 Schizoaffective disorder, bipolar type: Secondary | ICD-10-CM | POA: Diagnosis not present

## 2023-02-16 LAB — GLUCOSE, CAPILLARY
Glucose-Capillary: 200 mg/dL — ABNORMAL HIGH (ref 70–99)
Glucose-Capillary: 153 mg/dL — ABNORMAL HIGH (ref 70–99)
Glucose-Capillary: 252 mg/dL — ABNORMAL HIGH (ref 70–99)

## 2023-02-16 MED ORDER — LINAGLIPTIN 5 MG PO TABS
5.0000 mg | ORAL_TABLET | Freq: Every day | ORAL | Status: DC
  Administered 2023-02-16 – 2023-03-28 (×41): 5 mg via ORAL
  Filled 2023-02-16 (×41): qty 1

## 2023-02-16 NOTE — Progress Notes (Signed)
   02/16/23 0630  15 Minute Checks  Location Bedroom  Visual Appearance Calm  Behavior Sleeping  Sleep (Behavioral Health Patients Only)  Calculate sleep? (Click Yes once per 24 hr at 0600 safety check) Yes  Documented sleep last 24 hours 10.5

## 2023-02-16 NOTE — Plan of Care (Signed)
  Problem: Health Behavior/Discharge Planning: Goal: Ability to manage health-related needs will improve Outcome: Progressing   Problem: Clinical Measurements: Goal: Ability to maintain clinical measurements within normal limits will improve Outcome: Progressing Ms Kiya has been isolative to her room was encouraged to came for groups and compliant. Medications administered Per Provider orders and no adverse effects noted. Patient ambulates with a shuffling gait and using front wheel walker. Support ongoing.

## 2023-02-16 NOTE — Group Note (Signed)
Date:  02/16/2023 Time:  11:11 AM  Group Topic/Focus:  Coping With Mental Health Crisis:   The purpose of this group is to help patients identify strategies for coping with mental health crisis.  Group discusses possible causes of crisis and ways to manage them effectively. Goals Group:   The focus of this group is to help patients establish daily goals to achieve during treatment and discuss how the patient can incorporate goal setting into their daily lives to aide in recovery. Healthy Communication:   The focus of this group is to discuss communication, barriers to communication, as well as healthy ways to communicate with others.    Participation Level:  Active  Participation Quality:  Appropriate  Affect:  Appropriate  Cognitive:  Appropriate  Insight: Appropriate  Engagement in Group:  Engaged  Modes of Intervention:  Discussion  Additional Comments:    Betty Henderson L Betty Henderson 02/16/2023, 11:11 AM

## 2023-02-16 NOTE — Plan of Care (Signed)
  Problem: Education: Goal: Knowledge of General Education information will improve Description: Including pain rating scale, medication(s)/side effects and non-pharmacologic comfort measures Outcome: Progressing   Problem: Coping: Goal: Level of anxiety will decrease Outcome: Progressing   

## 2023-02-16 NOTE — Progress Notes (Signed)
[facility] MD Progress Note  02/16/2023 12:39 PM Betty Henderson  MRN:  [id number] Subjective: Betty Henderson is seen on rounds.  No changes.  No complaints.  She been compliant with medication.  No side effects.  Nurses report no issues. Principal Problem: Schizoaffective disorder, bipolar type (HCC) Diagnosis: Principal Problem:   Schizoaffective disorder, bipolar type (HCC)  Total Time spent with patient: 15 minutes  Past Psychiatric History: Schizoaffective disorder  Past Medical History:  Past Medical History:  Diagnosis Date   Anemia    Arthritis    Chronic pain    Drug-seeking behavior    Malingering    Osteopetrosis    Psychosis (HCC)    Schizoaffective disorder, bipolar type (HCC)     Past Surgical History:  Procedure Laterality Date   MOUTH SURGERY     TUBAL LIGATION     Family History:  Family History  Family history unknown: Yes   Family Psychiatric  History: Unremarkable Social History:  Social History   Substance and Sexual Activity  Alcohol Use Not Currently   Comment: 1 cocktail 3 weeks ago     Social History   Substance and Sexual Activity  Drug Use Not Currently   Types: Cocaine   Comment: states "it's legal though"    Social History   Socioeconomic History   Marital status: Single    Spouse name: Not on file   Number of children: Not on file   Years of education: Not on file   Highest education level: Not on file  Occupational History   Not on file  Tobacco Use   Smoking status: Every Day    Current packs/day: 0.50    Types: Cigarettes   Smokeless tobacco: Never  Vaping Use   Vaping status: Never Used  Substance and Sexual Activity   Alcohol use: Not Currently    Comment: 1 cocktail 3 weeks ago   Drug use: Not Currently    Types: Cocaine    Comment: states "it's legal though"   Sexual activity: Never  Other Topics Concern   Not on file  Social History Narrative   Not on file   Social Determinants of Health   Financial Resource  Strain: Not on file  Food Insecurity: Food Insecurity Present (08/22/2022)   Hunger Vital Sign    Worried About Running Out of Food in the Last Year: Sometimes true    Ran Out of Food in the Last Year: Sometimes true  Transportation Needs: Patient Unable To Answer (08/22/2022)   PRAPARE - Transportation    Lack of Transportation (Medical): Patient unable to answer    Lack of Transportation (Non-Medical): Patient unable to answer  Recent Concern: Transportation Needs - Unmet Transportation Needs (06/13/2022)   Received from [facility], [facility]   [facility] - Transportation    Lack of Transportation (Medical): Not on file    Lack of Transportation (Non-Medical): Yes  Physical Activity: Not on file  Stress: Not on file  Social Connections: Unknown (08/06/2021)   Received from [facility], [facility]   Social Network    Social Network: Not on file   Additional Social History:                         Sleep: Good  Appetite:  Good  Current Medications: Current Facility-Administered Medications  Medication Dose Route Frequency Provider Last Rate Last Admin   acetaminophen (TYLENOL) tablet 650 mg  650 mg Oral Q6H PRN [name],  DO   650 mg at 02/12/23 2133   alum & mag hydroxide-simeth (MAALOX/MYLANTA) 200-200-20 MG/5ML suspension 30 mL  30 mL Oral Q4H PRN [name], DO   30 mL at 02/15/23 1553   aspirin EC tablet 81 mg  81 mg Oral Daily [name], MD   81 mg at 02/16/23 5784   atorvastatin (LIPITOR) tablet 20 mg  20 mg Oral Daily [name], MD   20 mg at 02/16/23 6962   diphenhydrAMINE (BENADRYL) capsule 50 mg  50 mg Oral Q6H PRN [name], DO   50 mg at 09/29/22 2254   Or   diphenhydrAMINE (BENADRYL) injection 50 mg  50 mg Intramuscular Q6H PRN [name], DO       glimepiride (AMARYL) tablet 8 mg  8 mg Oral Q breakfast [name], MD   8 mg at 02/16/23 9528   haloperidol (HALDOL) tablet 5 mg  5 mg Oral  Q6H PRN [name], DO   5 mg at 09/28/22 2149   Or   haloperidol lactate (HALDOL) injection 5 mg  5 mg Intramuscular Q6H PRN [name], DO       ibuprofen (ADVIL) tablet 600 mg  600 mg Oral Q6H PRN [name], [name], MD   600 mg at 02/14/23 4132   linagliptin (TRADJENTA) tablet 5 mg  5 mg Oral Daily [name], MD   5 mg at 02/16/23 0940   LORazepam (ATIVAN) tablet 1 mg  1 mg Oral TID PRN [name], DO   1 mg at 01/23/23 2133   magnesium hydroxide (MILK OF MAGNESIA) suspension 30 mL  30 mL Oral Daily PRN [name], DO   30 mL at 11/08/22 1012   metFORMIN (GLUCOPHAGE) tablet 1,000 mg  1,000 mg Oral Q breakfast [name], MD   1,000 mg at 02/16/23 4401   oxybutynin (DITROPAN) tablet 5 mg  5 mg Oral BID [name], MD   5 mg at 02/16/23 0272   paliperidone (INVEGA SUSTENNA) injection 156 mg  156 mg Intramuscular Q28 days [name], MD   156 mg at 02/12/23 1010   traZODone (DESYREL) tablet 50 mg  50 mg Oral QHS PRN [name], DO   50 mg at 02/15/23 2154    Lab Results:  Results for orders placed or performed during the hospital encounter of 08/22/22 (from the past 48 hour(s))  Glucose, capillary     Status: Abnormal   Collection Time: 02/16/23 11:44 AM  Result Value Ref Range   Glucose-Capillary 200 (H) 70 - 99 mg/dL    Comment: Glucose reference range applies only to samples taken after fasting for at least 8 hours.    Blood Alcohol level:  Lab Results  Component Value Date   ETH <10 08/17/2022   ETH <10 01/11/2021    Metabolic Disorder Labs: Lab Results  Component Value Date   HGBA1C 11.0 (H) 01/27/2023   MPG 269 01/27/2023   No results found for: "PROLACTIN" Lab Results  Component Value Date   CHOL 259 (H) 01/27/2023   TRIG 155 (H) 01/27/2023   HDL 65 01/27/2023   CHOLHDL 4.0 01/27/2023   VLDL 31 01/27/2023   LDLCALC 163 (H) 01/27/2023    Physical Findings: AIMS:  , ,  ,  ,     CIWA:    COWS:     Musculoskeletal: Strength & Muscle Tone: within normal limits Gait & Station: normal Patient  leans: N/A  Psychiatric Specialty Exam:  Presentation  General Appearance:  Casual; Neat  Eye Contact: Fair  Speech: Slow; Slurred  Speech Volume: Decreased  Handedness: Right   Mood and Affect  Mood: Anxious  Affect: Inappropriate   Thought Process  Thought Processes: Disorganized  Descriptions of Associations:Loose  Orientation:Partial  Thought Content:Illogical; Rumination  History of Schizophrenia/Schizoaffective disorder:No data recorded Duration of Psychotic Symptoms:No data recorded Hallucinations:No data recorded Ideas of Reference:Paranoia  Suicidal Thoughts:No data recorded Homicidal Thoughts:No data recorded  Sensorium  Memory: Immediate Fair; Remote Poor  Judgment: Poor  Insight: Poor   Executive Functions  Concentration: Poor  Attention Span: Fair  Recall: [employer] of Knowledge: Fair  Language: Fair   Psychomotor Activity  Psychomotor Activity:No data recorded  Assets  Assets: Communication Skills   Sleep  Sleep:No data recorded    Blood pressure 100/60, pulse 75, temperature 98.2 F (36.8 C), resp. rate 16, height 5\' 6"  (1.676 m), weight (P) 82.8 kg, SpO2 100%. Body mass index is 29.46 kg/m (pended).   Treatment Plan Summary: Daily contact with patient to assess and evaluate symptoms and progress in treatment, Medication management, and Plan continue current medication.  [name], DO 02/16/2023, 12:39 PM

## 2023-02-16 NOTE — Plan of Care (Signed)
  Problem: Education: Goal: Knowledge of General Education information will improve Description: Including pain rating scale, medication(s)/side effects and non-pharmacologic comfort measures Outcome: Progressing   Problem: Health Behavior/Discharge Planning: Goal: Ability to manage health-related needs will improve Outcome: Progressing   Problem: Clinical Measurements: Goal: Ability to maintain clinical measurements within normal limits will improve Outcome: Progressing Goal: Diagnostic test results will improve Outcome: Progressing   Problem: Coping: Goal: Level of anxiety will decrease Outcome: Progressing   Problem: Elimination: Goal: Will not experience complications related to bowel motility Outcome: Progressing Goal: Will not experience complications related to urinary retention Outcome: Progressing   Problem: Pain Managment: Goal: General experience of comfort will improve Outcome: Progressing   Problem: Safety: Goal: Ability to remain free from injury will improve Outcome: Progressing   Problem: Skin Integrity: Goal: Risk for impaired skin integrity will decrease Outcome: Progressing

## 2023-02-16 NOTE — Group Note (Signed)
Date:  02/16/2023 Time:  10:22 PM  Group Topic/Focus:  Making Healthy Choices:   The focus of this group is to help patients identify negative/unhealthy choices they were using prior to admission and identify positive/healthier coping strategies to replace them upon discharge.    Participation Level:  Minimal  Participation Quality:  Appropriate  Affect:  Appropriate  Cognitive:  Appropriate  Insight: Good  Engagement in Group:  Engaged  Modes of Intervention:  Discussion  Additional Comments:    Betty Henderson 02/16/2023, 10:22 PM

## 2023-02-16 NOTE — Progress Notes (Signed)
 Patient present in dayroom for breakfast.  Flat, sad affect.  Denies anxiety and depression.  Denies SI/HI. Denies pain.  Patient was not observed responding to internal stimuli.   Compliant with scheduled medications.  15 min checks in place for safety.  Patient isolates to room with exception of meals and groups.  Minimal interaction.   CBGs ordered starting today at lunch.  Patient made aware and accepting.

## 2023-02-16 NOTE — Plan of Care (Signed)
Pt received in bed with eyes open. RR even and unlabored. Calm and cooperative. Po medications given as scheduled. Denies anxiety and depression. Q 15 min checks maintained for safety.  Patient remains safe at this time.   Problem: Nutrition: Goal: Adequate nutrition will be maintained Outcome: Progressing   Problem: Coping: Goal: Level of anxiety will decrease Outcome: Progressing   Problem: Elimination: Goal: Will not experience complications related to bowel motility Outcome: Progressing Goal: Will not experience complications related to urinary retention Outcome: Progressing

## 2023-02-17 DIAGNOSIS — F250 Schizoaffective disorder, bipolar type: Secondary | ICD-10-CM | POA: Diagnosis not present

## 2023-02-17 LAB — GLUCOSE, CAPILLARY
Glucose-Capillary: 183 mg/dL — ABNORMAL HIGH (ref 70–99)
Glucose-Capillary: 238 mg/dL — ABNORMAL HIGH (ref 70–99)

## 2023-02-17 MED ORDER — ZONISAMIDE 100 MG PO CAPS
100.0000 mg | ORAL_CAPSULE | Freq: Every day | ORAL | Status: DC
  Administered 2023-02-17 – 2023-03-27 (×39): 100 mg via ORAL
  Filled 2023-02-17 (×39): qty 1

## 2023-02-17 MED ORDER — METFORMIN HCL 500 MG PO TABS
1000.0000 mg | ORAL_TABLET | ORAL | Status: DC
  Administered 2023-02-17 – 2023-03-28 (×78): 1000 mg via ORAL
  Filled 2023-02-17 (×80): qty 2

## 2023-02-17 MED ORDER — TOPIRAMATE 25 MG PO TABS
25.0000 mg | ORAL_TABLET | ORAL | Status: DC
  Administered 2023-02-17 – 2023-03-12 (×46): 25 mg via ORAL
  Filled 2023-02-17 (×46): qty 1

## 2023-02-17 NOTE — BHH Counselor (Signed)
CSW sent FL2 to Abby at [facility], [phone]  Reynaldo Minium, MSW, [facility] 02/17/2023 1:57 PM

## 2023-02-17 NOTE — Progress Notes (Signed)
[facility] MD Progress Note  02/17/2023 12:42 PM Betty Henderson  MRN:  [id number] Subjective: Betty Henderson is seen on rounds.  Physical therapy came to see her because she is unable to tie her shoes.  She skin a lot of weight since she has been here because she has no way of exercising.  PT stated that OT would be a better choice but I do not think he can do anything about.  I talked to her about starting Zonegran and Topamax.  Decreasing her appetite is probably the best way to go.  Getting her discharge would be even better.  Social work is working on it. Principal Problem: Schizoaffective disorder, bipolar type (HCC) Diagnosis: Principal Problem:   Schizoaffective disorder, bipolar type (HCC)  Total Time spent with patient: 15 minutes  Past Psychiatric History: Schizoaffective disorder and polysubstance abuse  Past Medical History:  Past Medical History:  Diagnosis Date   Anemia    Arthritis    Chronic pain    Drug-seeking behavior    Malingering    Osteopetrosis    Psychosis (HCC)    Schizoaffective disorder, bipolar type (HCC)     Past Surgical History:  Procedure Laterality Date   MOUTH SURGERY     TUBAL LIGATION     Family History:  Family History  Family history unknown: Yes   Family Psychiatric  History: Unremarkable Social History:  Social History   Substance and Sexual Activity  Alcohol Use Not Currently   Comment: 1 cocktail 3 weeks ago     Social History   Substance and Sexual Activity  Drug Use Not Currently   Types: Cocaine   Comment: states "it's legal though"    Social History   Socioeconomic History   Marital status: Single    Spouse name: Not on file   Number of children: Not on file   Years of education: Not on file   Highest education level: Not on file  Occupational History   Not on file  Tobacco Use   Smoking status: Every Day    Current packs/day: 0.50    Types: Cigarettes   Smokeless tobacco: Never  Vaping Use   Vaping status: Never Used   Substance and Sexual Activity   Alcohol use: Not Currently    Comment: 1 cocktail 3 weeks ago   Drug use: Not Currently    Types: Cocaine    Comment: states "it's legal though"   Sexual activity: Never  Other Topics Concern   Not on file  Social History Narrative   Not on file   Social Determinants of Health   Financial Resource Strain: Not on file  Food Insecurity: Food Insecurity Present (08/22/2022)   Hunger Vital Sign    Worried About Running Out of Food in the Last Year: Sometimes true    Ran Out of Food in the Last Year: Sometimes true  Transportation Needs: Patient Unable To Answer (08/22/2022)   PRAPARE - Transportation    Lack of Transportation (Medical): Patient unable to answer    Lack of Transportation (Non-Medical): Patient unable to answer  Recent Concern: Transportation Needs - Unmet Transportation Needs (06/13/2022)   Received from [facility], [facility]   [facility] - Transportation    Lack of Transportation (Medical): Not on file    Lack of Transportation (Non-Medical): Yes  Physical Activity: Not on file  Stress: Not on file  Social Connections: Unknown (08/06/2021)   Received from [facility], [facility]   Social Network  Social Network: Not on file   Additional Social History:                         Sleep: Good  Appetite:  Good  Current Medications: Current Facility-Administered Medications  Medication Dose Route Frequency Provider Last Rate Last Admin   acetaminophen (TYLENOL) tablet 650 mg  650 mg Oral Q6H PRN [name], DO   650 mg at 02/12/23 2133   alum & mag hydroxide-simeth (MAALOX/MYLANTA) 200-200-20 MG/5ML suspension 30 mL  30 mL Oral Q4H PRN [name], DO   30 mL at 02/15/23 1553   aspirin EC tablet 81 mg  81 mg Oral Daily [name], MD   81 mg at 02/17/23 0902   atorvastatin (LIPITOR) tablet 20 mg  20 mg Oral Daily [name], MD   20 mg at 02/17/23 0902   diphenhydrAMINE (BENADRYL)  capsule 50 mg  50 mg Oral Q6H PRN [name], DO   50 mg at 09/29/22 2254   Or   diphenhydrAMINE (BENADRYL) injection 50 mg  50 mg Intramuscular Q6H PRN [name], DO       glimepiride (AMARYL) tablet 8 mg  8 mg Oral Q breakfast [name], MD   8 mg at 02/17/23 4696   haloperidol (HALDOL) tablet 5 mg  5 mg Oral Q6H PRN [name], DO   5 mg at 09/28/22 2149   Or   haloperidol lactate (HALDOL) injection 5 mg  5 mg Intramuscular Q6H PRN [name], DO       ibuprofen (ADVIL) tablet 600 mg  600 mg Oral Q6H PRN [name], [name], MD   600 mg at 02/16/23 2125   linagliptin (TRADJENTA) tablet 5 mg  5 mg Oral Daily [name], MD   5 mg at 02/17/23 2952   LORazepam (ATIVAN) tablet 1 mg  1 mg Oral TID PRN [name], DO   1 mg at 01/23/23 2133   magnesium hydroxide (MILK OF MAGNESIA) suspension 30 mL  30 mL Oral Daily PRN [name], DO   30 mL at 11/08/22 1012   metFORMIN (GLUCOPHAGE) tablet 1,000 mg  1,000 mg Oral [id number] [name], DO       oxybutynin [facility]) tablet 5 mg  5 mg Oral BID [name], MD   5 mg at 02/17/23 0903   paliperidone (INVEGA SUSTENNA) injection 156 mg  156 mg Intramuscular Q28 days [name], MD   156 mg at 02/12/23 1010   topiramate (TOPAMAX) tablet 25 mg  25 mg Oral [id number] [name], DO   25 mg at 02/17/23 1218   traZODone (DESYREL) tablet 50 mg  50 mg Oral QHS PRN [name], DO   50 mg at 02/16/23 2126   zonisamide (ZONEGRAN) capsule 100 mg  100 mg Oral QHS [name], DO        Lab Results:  Results for orders placed or performed during the hospital encounter of 08/22/22 (from the past 48 hour(s))  Glucose, capillary     Status: Abnormal   Collection Time: 02/16/23 11:44 AM  Result Value Ref Range   Glucose-Capillary 200 (H) 70 - 99 mg/dL    Comment: Glucose reference range applies only to samples taken after  fasting for at least 8 hours.  Glucose, capillary     Status: Abnormal   Collection Time: 02/16/23  4:15 PM  Result  Value Ref Range   Glucose-Capillary 153 (H) 70 - 99 mg/dL    Comment: Glucose reference range applies only to samples taken after fasting for at least 8 hours.  Glucose, capillary     Status: Abnormal   Collection Time: 02/16/23  8:06 PM  Result Value Ref Range   Glucose-Capillary 252 (H) 70 - 99 mg/dL    Comment: Glucose reference range applies only to samples taken after fasting for at least 8 hours.  Glucose, capillary     Status: Abnormal   Collection Time: 02/17/23  7:37 AM  Result Value Ref Range   Glucose-Capillary 183 (H) 70 - 99 mg/dL    Comment: Glucose reference range applies only to samples taken after fasting for at least 8 hours.    Blood Alcohol level:  Lab Results  Component Value Date   ETH <10 08/17/2022   ETH <10 01/11/2021    Metabolic Disorder Labs: Lab Results  Component Value Date   HGBA1C 11.0 (H) 01/27/2023   MPG 269 01/27/2023   No results found for: "PROLACTIN" Lab Results  Component Value Date   CHOL 259 (H) 01/27/2023   TRIG 155 (H) 01/27/2023   HDL 65 01/27/2023   CHOLHDL 4.0 01/27/2023   VLDL 31 01/27/2023   LDLCALC 163 (H) 01/27/2023    Physical Findings: AIMS:  , ,  ,  ,    CIWA:    COWS:     Musculoskeletal: Strength & Muscle Tone: within normal limits Gait & Station: normal Patient leans: N/A  Psychiatric Specialty Exam:  Presentation  General Appearance:  Casual; Neat  Eye Contact: Fair  Speech: Slow; Slurred  Speech Volume: Decreased  Handedness: Right   Mood and Affect  Mood: Anxious  Affect: Inappropriate   Thought Process  Thought Processes: Disorganized  Descriptions of Associations:Loose  Orientation:Partial  Thought Content:Illogical; Rumination  History of Schizophrenia/Schizoaffective disorder:No data recorded Duration of Psychotic Symptoms:No data  recorded Hallucinations:No data recorded Ideas of Reference:Paranoia  Suicidal Thoughts:No data recorded Homicidal Thoughts:No data recorded  Sensorium  Memory: Immediate Fair; Remote Poor  Judgment: Poor  Insight: Poor   Executive Functions  Concentration: Poor  Attention Span: Fair  Recall: [employer] of Knowledge: Fair  Language: Fair   Psychomotor Activity  Psychomotor Activity:No data recorded  Assets  Assets: Communication Skills   Sleep  Sleep:No data recorded    Blood pressure 100/65, pulse 75, temperature 98.2 F (36.8 C), resp. rate 18, height 5\' 6"  (1.676 m), weight (P) 82.8 kg, SpO2 100%. Body mass index is 29.46 kg/m (pended).   Treatment Plan Summary: Daily contact with patient to assess and evaluate symptoms and progress in treatment, Medication management, and Plan start Zonegran and Topamax.  [name], DO 02/17/2023, 12:42 PM

## 2023-02-17 NOTE — Progress Notes (Signed)
Patient is a voluntary admission to [facility] for SAD and awaiting placement.  Patient c/o not being able to bend over to put her own socks on.  Evaluated ROM with patient and noted some stiffness in the hip but otherwise moved well.  Spoke with PT and Dr. Marlou Porch.  Pt needs locked out of her room during the day to promote activity since laying around so much is causing deterioration. Also added topamax and zonegran for weight loss  Also increased her dose of metformin.Patient denies SI, HI, AVH, anxiety and depression.  Will continue to monitor.

## 2023-02-17 NOTE — Progress Notes (Signed)
   02/17/23 1600  Spiritual Encounters  Type of Visit Follow up  Care provided to: Patient  Conversation partners present during encounter Social worker/Care management/TOC   Chaplain spoke with social worker about updates about patient. Social Worker shared that she has found a guardian for the patient and that they are now looking for a nursing home for her. Chaplain will continue to follow up with patient and SW.

## 2023-02-17 NOTE — BHH Counselor (Signed)
CSW submitted for PASAAR number on behlaf of pt  Betty Henderson, MSW, [facility] 02/17/2023 1:53 PM

## 2023-02-17 NOTE — Group Note (Signed)
Recreation Therapy Group Note   Group Topic:General Recreation  Group Date: 02/17/2023 Start Time: 1400 End Time: 1500 Facilitators: Rosina Lowenstein, LRT, CTRS Location: Courtyard  Group Description: [employer]. LRT and patients played games of basketball, drew with chalk, and played corn hole while outside in the courtyard while getting fresh air and sunlight. Music was being played in the background. LRT and peers conversed about different games they have played before, what they do in their free time and anything else that is on their minds. LRT encouraged pts to drink water after being outside, sweating and getting their heart rate up.  Goal Area(s) Addressed: Patient will build on frustration tolerance skills. Patients will partake in a competitive play game with peers. Patients will gain knowledge of new leisure interest/hobby.    Affect/Mood: Appropriate   Participation Level: Active and Engaged   Participation Quality: Independent   Behavior: Appropriate, Calm, and Cooperative   Speech/Thought Process: Coherent   Insight: Good   Judgement: Good   Modes of Intervention: Activity and Music   Patient Response to Interventions:  Attentive, Engaged, and Receptive   Education Outcome:  Acknowledges education   Clinical Observations/Individualized Feedback: Betty Henderson was active in their participation of session activities and group discussion. Pt gave song suggestions for the group to listen to. Pt interacted well with LRT and peers duration of session.    Plan: Continue to engage patient in RT group sessions 2-3x/week.   Rosina Lowenstein, LRT, CTRS 02/17/2023 3:22 PM

## 2023-02-17 NOTE — [place] FL2 (Signed)
[place] MEDICAID FL2 LEVEL OF CARE FORM     IDENTIFICATION  Patient Name: Betty Henderson Birthdate: 1960-09-05 Sex: female Admission Date (Current Location): 08/22/2022  [facility] and [place] Number:  Producer, television/film/video and Address:  [facility], [address], [place], [place] [address]      Provider Number: [id number]  Attending Physician Name and Address:  [name],*  Relative Name and Phone Number:  Elleanna Arrison, son [phone]    Current Level of Care: Hospital Recommended Level of Care: Nursing Facility Prior Approval Number:    Date Approved/Denied:   PASRR Number:    Discharge Plan: Other (Comment) (ALF)    Current Diagnoses: Patient Active Problem List   Diagnosis Date Noted   Schizoaffective disorder, bipolar type (HCC) 08/19/2022   Midline low back pain without sciatica 06/14/2022   Malingering 05/31/2022   Neck pain 05/08/2022   Non compliance w medication regimen 02/07/2020   Mild cognitive impairment 10/12/2018   Tobacco use disorder 09/14/2018   Homeless single person 08/22/2017   Arthritis 05/04/2016   Low vitamin B12 level 05/04/2016   Closed avulsion fracture of distal end of left fibula 11/18/2014   Sprain of left ankle 11/18/2014   Acute exacerbation of chronic paranoid schizophrenia (HCC) 09/03/2014    Orientation RESPIRATION BLADDER Height & Weight     Self  Normal Incontinent Weight: (P) 182 lb 8 oz (82.8 kg) Height:  5\' 6"  (167.6 cm)  BEHAVIORAL SYMPTOMS/MOOD NEUROLOGICAL BOWEL NUTRITION STATUS  Wanderer  (NA) Continent Diet (Diabetic Diet)  AMBULATORY STATUS COMMUNICATION OF NEEDS Skin   Independent Verbally Normal                       Personal Care Assistance Level of Assistance              Functional Limitations Info  Speech     Speech Info: Impaired (Speech is garbled but can be understood)    SPECIAL CARE FACTORS FREQUENCY  Blood pressure Blood Pressure  Frequency: Once every 12 hours                  Contractures Contractures Info: Not present    Additional Factors Info  Code Status, Allergies Code Status Info: FULL Allergies Info: Lemon Oil, Proanthocyanidin,Strawberry Extract, Adhesive (tape) ,Cherry , Wound Dressing Adhesive Psychotropic Info: Invega Sustenna 156 mg, Invega 24 hour tablet 3 mg         Current Medications (02/17/2023):  This is the current hospital active medication list Current Facility-Administered Medications  Medication Dose Route Frequency Provider Last Rate Last Admin   acetaminophen (TYLENOL) tablet 650 mg  650 mg Oral Q6H PRN [name], DO   650 mg at 02/12/23 2133   alum & mag hydroxide-simeth (MAALOX/MYLANTA) 200-200-20 MG/5ML suspension 30 mL  30 mL Oral Q4H PRN [name], DO   30 mL at 02/15/23 1553   aspirin EC tablet 81 mg  81 mg Oral Daily [name], MD   81 mg at 02/17/23 0902   atorvastatin (LIPITOR) tablet 20 mg  20 mg Oral Daily [name], MD   20 mg at 02/17/23 0902   diphenhydrAMINE (BENADRYL) capsule 50 mg  50 mg Oral Q6H PRN [name], DO   50 mg at 09/29/22 2254   Or   diphenhydrAMINE (BENADRYL) injection 50 mg  50 mg Intramuscular Q6H PRN [name], DO  glimepiride (AMARYL) tablet 8 mg  8 mg Oral Q breakfast [name], MD   8 mg at 02/17/23 1610   haloperidol (HALDOL) tablet 5 mg  5 mg Oral Q6H PRN [name], DO   5 mg at 09/28/22 2149   Or   haloperidol lactate (HALDOL) injection 5 mg  5 mg Intramuscular Q6H PRN [name], DO       ibuprofen (ADVIL) tablet 600 mg  600 mg Oral Q6H PRN [name], [name], MD   600 mg at 02/16/23 2125   linagliptin (TRADJENTA) tablet 5 mg  5 mg Oral Daily [name], MD   5 mg at 02/17/23 9604   LORazepam (ATIVAN) tablet 1 mg  1 mg Oral TID PRN [name], DO   1 mg at 01/23/23 2133   magnesium hydroxide (MILK OF MAGNESIA) suspension 30 mL   30 mL Oral Daily PRN [name], DO   30 mL at 11/08/22 1012   metFORMIN (GLUCOPHAGE) tablet 1,000 mg  1,000 mg Oral BH-q8a4p [name], DO       oxybutynin [facility]) tablet 5 mg  5 mg Oral BID [name], MD   5 mg at 02/17/23 0903   paliperidone (INVEGA SUSTENNA) injection 156 mg  156 mg Intramuscular Q28 days [name], MD   156 mg at 02/12/23 1010   topiramate (TOPAMAX) tablet 25 mg  25 mg Oral BH-q8a4p [name], DO   25 mg at 02/17/23 1218   traZODone (DESYREL) tablet 50 mg  50 mg Oral QHS PRN [name], DO   50 mg at 02/16/23 2126   zonisamide (ZONEGRAN) capsule 100 mg  100 mg Oral QHS [name], DO         Discharge Medications: Please see discharge summary for a list of discharge medications.  Relevant Imaging Results:  Relevant Lab Results:   Additional Information SSN # [id number]  Elza Rafter, [place]

## 2023-02-17 NOTE — BH IP Treatment Plan (Signed)
Interdisciplinary Treatment and Diagnostic Plan Update  02/17/2023 Time of Session: 9:30 AM  Betty Henderson MRN: [id number]  Principal Diagnosis: Schizoaffective disorder, bipolar type (HCC)  Secondary Diagnoses: Principal Problem:   Schizoaffective disorder, bipolar type (HCC)   Current Medications:  Current Facility-Administered Medications  Medication Dose Route Frequency Provider Last Rate Last Admin   acetaminophen (TYLENOL) tablet 650 mg  650 mg Oral Q6H PRN [name], DO   650 mg at 02/12/23 2133   alum & mag hydroxide-simeth (MAALOX/MYLANTA) 200-200-20 MG/5ML suspension 30 mL  30 mL Oral Q4H PRN [name], DO   30 mL at 02/15/23 1553   aspirin EC tablet 81 mg  81 mg Oral Daily [name], MD   81 mg at 02/17/23 0902   atorvastatin (LIPITOR) tablet 20 mg  20 mg Oral Daily [name], MD   20 mg at 02/17/23 0902   diphenhydrAMINE (BENADRYL) capsule 50 mg  50 mg Oral Q6H PRN [name], DO   50 mg at 09/29/22 2254   Or   diphenhydrAMINE (BENADRYL) injection 50 mg  50 mg Intramuscular Q6H PRN [name], DO       glimepiride (AMARYL) tablet 8 mg  8 mg Oral Q breakfast [name], MD   8 mg at 02/17/23 8416   haloperidol (HALDOL) tablet 5 mg  5 mg Oral Q6H PRN [name], DO   5 mg at 09/28/22 2149   Or   haloperidol lactate (HALDOL) injection 5 mg  5 mg Intramuscular Q6H PRN [name], DO       ibuprofen (ADVIL) tablet 600 mg  600 mg Oral Q6H PRN [name], [name], MD   600 mg at 02/16/23 2125   linagliptin (TRADJENTA) tablet 5 mg  5 mg Oral Daily [name], MD   5 mg at 02/17/23 6063   LORazepam (ATIVAN) tablet 1 mg  1 mg Oral TID PRN [name], DO   1 mg at 01/23/23 2133   magnesium hydroxide (MILK OF MAGNESIA) suspension 30 mL  30 mL Oral Daily PRN [name], DO   30 mL at 11/08/22 1012   metFORMIN (GLUCOPHAGE) tablet 1,000 mg  1,000 mg Oral Q breakfast  [name], MD   1,000 mg at 02/17/23 0902   oxybutynin (DITROPAN) tablet 5 mg  5 mg Oral BID [name], MD   5 mg at 02/17/23 0903   paliperidone (INVEGA SUSTENNA) injection 156 mg  156 mg Intramuscular Q28 days [name], MD   156 mg at 02/12/23 1010   traZODone (DESYREL) tablet 50 mg  50 mg Oral QHS PRN [name], DO   50 mg at 02/16/23 2126   PTA Medications: Medications Prior to Admission  Medication Sig Dispense Refill Last Dose   ondansetron (ZOFRAN) 4 MG tablet Take 1 tablet (4 mg total) by mouth every 8 (eight) hours as needed for nausea or vomiting. (Patient not taking: Reported on 08/17/2022) 20 tablet 0     Patient Stressors: Financial difficulties   Health problems   Medication change or noncompliance    Patient Strengths: Ability for insight   Treatment Modalities: Medication Management, Group therapy, Case management,  1 to 1 session with clinician, Psychoeducation, Recreational therapy.   Physician Treatment Plan for Primary Diagnosis: Schizoaffective disorder, bipolar type (HCC) Long Term Goal(s): Improvement in symptoms so as ready for discharge   Short Term Goals: Ability to identify changes in lifestyle  to reduce recurrence of condition will improve Ability to verbalize feelings will improve Ability to disclose and discuss suicidal ideas Ability to demonstrate self-control will improve Ability to identify and develop effective coping behaviors will improve Ability to maintain clinical measurements within normal limits will improve Compliance with prescribed medications will improve Ability to identify triggers associated with substance abuse/mental health issues will improve  Medication Management: Evaluate patient's response, side effects, and tolerance of medication regimen.  Therapeutic Interventions: 1 to 1 sessions, Unit Group sessions and Medication administration.  Evaluation of Outcomes: Progressing  Physician  Treatment Plan for Secondary Diagnosis: Principal Problem:   Schizoaffective disorder, bipolar type (HCC)  Long Term Goal(s): Improvement in symptoms so as ready for discharge   Short Term Goals: Ability to identify changes in lifestyle to reduce recurrence of condition will improve Ability to verbalize feelings will improve Ability to disclose and discuss suicidal ideas Ability to demonstrate self-control will improve Ability to identify and develop effective coping behaviors will improve Ability to maintain clinical measurements within normal limits will improve Compliance with prescribed medications will improve Ability to identify triggers associated with substance abuse/mental health issues will improve     Medication Management: Evaluate patient's response, side effects, and tolerance of medication regimen.  Therapeutic Interventions: 1 to 1 sessions, Unit Group sessions and Medication administration.  Evaluation of Outcomes: Progressing   RN Treatment Plan for Primary Diagnosis: Schizoaffective disorder, bipolar type (HCC) Long Term Goal(s): Knowledge of disease and therapeutic regimen to maintain health will improve  Short Term Goals: Ability to remain free from injury will improve, Ability to verbalize frustration and anger appropriately will improve, Ability to demonstrate self-control, Ability to participate in decision making will improve, Ability to verbalize feelings will improve, Ability to disclose and discuss suicidal ideas, Ability to identify and develop effective coping behaviors will improve, and Compliance with prescribed medications will improve  Medication Management: RN will administer medications as ordered by provider, will assess and evaluate patient's response and provide education to patient for prescribed medication. RN will report any adverse and/or side effects to prescribing provider.  Therapeutic Interventions: 1 on 1 counseling sessions,  Psychoeducation, Medication administration, Evaluate responses to treatment, Monitor vital signs and CBGs as ordered, Perform/monitor CIWA, COWS, AIMS and Fall Risk screenings as ordered, Perform wound care treatments as ordered.  Evaluation of Outcomes: Progressing   LCSW Treatment Plan for Primary Diagnosis: Schizoaffective disorder, bipolar type (HCC) Long Term Goal(s): Safe transition to appropriate next level of care at discharge, Engage patient in therapeutic group addressing interpersonal concerns.  Short Term Goals: Engage patient in aftercare planning with referrals and resources, Increase social support, Increase ability to appropriately verbalize feelings, Increase emotional regulation, Facilitate acceptance of mental health diagnosis and concerns, Facilitate patient progression through stages of change regarding substance use diagnoses and concerns, Identify triggers associated with mental health/substance abuse issues, and Increase skills for wellness and recovery  Therapeutic Interventions: Assess for all discharge needs, 1 to 1 time with Social worker, Explore available resources and support systems, Assess for adequacy in community support network, Educate family and significant other(s) on suicide prevention, Complete Psychosocial Assessment, Interpersonal group therapy.  Evaluation of Outcomes: Progressing  Progress in Treatment: Attending groups: Yes. and No. Participating in groups: Yes. and No. Taking medication as prescribed: Yes. Toleration medication: Yes. Family/Significant other contact made: Yes, individual(s) contacted:   Damita Ciaccia, son, [phone]  Patient understands diagnosis: Yes. Discussing patient identified problems/goals with staff: Yes. Medical problems stabilized or resolved: Yes. Denies  suicidal/homicidal ideation: Yes. Issues/concerns per patient self-inventory: No. Other: none   New problem(s) identified: No, Describe:  none Updates  12/12/22: No changes made at this time Updates 12/17/22: No changes made at this time Update 12/24/22 No changes at this time. Update 12/29/2022: No changes at this time. Update 01/03/23: No changes at this time Update 01/08/23: No changes at this time Update 01/13/23: No changes at this time Update 01/18/23: None at this time. Update 01/23/2023:  No changes at this time. Update 01/28/23: Pt recently diagnosed with Diabetes Type II  Update 02/02/23: None at this time. Update 02/07/23: No changes at this time Update 02/12/23: No changes at this time Update 02/17/23: No changes at this time    New Short Term/Long Term Goal(s): Update 6/30: none at this time. Update 09/27/2022:  No changes at this time.  Update 10/02/2022:  No changes at this time. Update 10/07/22: No changes at this time 10/12/22: No changes at this time Update 10/18/22: No changes at this time Update 10/23/22: No changes at this time Update 10/28/22: No changes at this time Update 11/07/22: No changes at this time Update 11/12/22: No changes at this time  Update 11/17/22: None at this time. Update 11/22/22: None at this time. Update 11/27/22 No changes at this time. Update 12/02/22 No changes at this time  Update 12/07/22: None at this time. Updates 12/12/22: No changes made at this time Updates 12/17/22: No changes made at this time Update 12/24/22 No changes at this time. Update 12/29/2022: No changes at this time. Update 01/03/23: No changes at this time Update 01/08/23: No changes at this time Update 01/13/23: No changes at this time   Update 01/18/23: No changes at this time. Update 01/23/2023:  No changes at this time. Update 01/28/2023:  No changes at this time. Update 02/02/23: None at this time.  Update 02/07/23: No changes at this time Update 02/12/23: No changes at this time Update 02/17/23: No changes at this time        Patient Goals:  Update 6/30: none at this time. Update 09/27/2022:  No changes at this time. Update 10/02/2022:  No changes at this time.  Update 10/07/22: No changes at this time 10/12/22: No changes at this time Update 10/18/22: No changes at this time Update 10/23/22: No changes at this time Update 10/28/22: No changes at this time Update 11/07/22: No changes at this time Update 11/12/22: No changes at this time Update 11/17/22: None at this time. Update 11/22/22: None at this time. Update 11/27/22 No changes at this time Update 12/02/22 No changes at this time   Update 12/07/22: None at this time. Updates 12/12/22: No changes made at this time Updates 12/17/22: No changes made at this time Update 12/24/22 No changes at this time. Update 12/29/2022: No changes at this time. Update 01/03/23: No changes at this time Update 01/08/23: No changes at this time Update 01/13/23: No changes at this time   Update 01/18/23: No changes at this time. Update 01/23/2023:  No changes at this time.  Update 01/28/2023:  No changes at this time. Update 02/02/23: None at this time.  Update 02/07/23: No changes at this time Update 02/12/23: No changes at this time Update 02/17/23: No changes at this time        Discharge Plan or Barriers: Update 6/30: APS report has been made and patient is being investigated for guardianship needs.  Remains homeless with limited supports. Update 09/27/2022:  No changes at this time.  Update 10/02/2022:  Patient remains safe on the unit at this time.  Patient remains psychotic at this time.  APS report has been made, however, no follow up from the caseworker on her case.  No safe discharge identified.  CSW has requested that application for Medicaid and disability be completed.   Update 10/07/22: No changes at this time 10/12/22: No changes at this time Update 10/18/22: No changes at this time Update 10/23/22: No changes at this time Update 10/23/22: No changes at this time Update 10/28/22: According to [facility], pt's caseworker at DSS, the petition was filed for guardianship on 10/21/22, now awaiting for the petition to be approvedUpdate 11/07/22: No changes at  this time Update 11/12/22: CSW sent over patients information to [facility] and [facility], awaiting a response. CSW awaiting word from DSS regarding pt's petition and what agency will become her guardian. Update 11/17/22: No changes at this time. Update 11/22/22: CSW has sent patients information to multiple facilities that were suggested by leadership, pt has been denied from the facility or the facility does not respond. CSW continues to send pt's information to nursing homes. Update 11/27/22 CSW contacted DSS to inquire about the status of guardianship. No response at this time Update 12/02/22 Supervisor Loraine Leriche contacted DSS who stated they are not taking pt on for guardianship but that Quincy Carnes will be present tomorrow to do a visit with social worker and pt and that pt's son will be signing documentation for placement for the pt. Update 12/07/22: [facility] DSS has declined to take guardianship. Son, Casimiro Needle is to step into a more present role in deciding placement. A conversation is still needed. Updates 12/12/22: CSW staffed case with leadership and they state that a family meeting must happen with pt's son. CSW contacted pt's financial navigator and her DSS worker so that they may be present on the call and are able to speak to the efforts they have made to secure pt funding and housing. CSW waiting on responses from both, leadership is aware. Updates 12/17/22: CSW awaiting updates from both [facility] DSS and [facility] financial navigators as to status of pt's applications for Medicaid/[employer] and Disability, respectively. Update 12/24/22 CSW sent email regarding family meeting per request of supervisors. CSW awaiting responses from all parties to schedule family meeting. Update: 12/29/2022 No changes at this time. Update 01/03/23: CSW continues to await responses on the scheduling of the family meeting. CSW received call from Primitivo Gauze at [employer], CSW sent FL2 to [employer] to aide in  pt's discharge planning. Update 01/08/23: CSW has reached out again to leadership and financial counseling. CSW continues to await a response. CSW called Sharlee Blew, awaiting a response for status of pt's placement options. Update 01/13/23: Pt is to meet with psychologist tomorrow 10/22 to complete capacity testing so that DSS can retry for guardianship  Update 01/18/23: The patient is still awaiting placement.  Update 01/23/2023:  Patient remains safe on the unit at this time.  CSW team continues to work on placement.  CSW team in contact with DSS to develop plan on guardianship.   Work continues to Hospital doctor. Update 01/28/2023:  Medicaid application remains pending. DSS awaiting records from medical records to retry fo guardianship.  Update 02/02/23: None at this time.  Update 02/07/23: Sharlee Blew at DSS confirms that pt records were received and that they are petitioning for guardianship, she informs CSW that decision should be made on court date by Monday or Tuesday  of next week. CSW still awaits status update on Medicaid application Update 02/12/23: Medicaid Application still pending. TOC Supervisors recommend that CSW continue searching for placement as we await DSS taking guardianship. Update 02/17/23: DSS confirms guardianship date for January 14th, 2025. CSW continue to search for placement      Reason for Continuation of Hospitalization: Hallucinations Mania Medication stabilization   Estimated Length of Stay:  Update 6/30: 1-7 days Update 09/27/2022:  TBD Update 10/02/2022:  No changes at this time. UpdaUpdate 10/18/22: No changes at this time te 10/07/22: No changes at this time Update 10/18/22: No changes at this time Update 10/23/22: No changes at this time Update 10/28/22: No changes at this time Update 11/07/22: No changes at this time Update 11/12/22: No changes at this time  Update 11/17/22: No changes at this time. Update 11/22/22: None at this time. Update 11/27/22 No changes  at this time. Update 12/02/22 No changes at this time  Update 12/07/22: None at this time. Updates 12/12/22: No changes made at this time Updates 12/17/22: TBD. Update 12/29/2022: No changes at this time. Update 01/03/23: TBDUpdate 01/08/23: TBD Update 01/13/23: TBD  Update 01/18/23: No changes at this time.  Update 01/23/2023:  TBD Update 01/28/2023:  TBD Update 02/02/23: None at this time. Update 02/07/23: TBD Update 02/12/23: TBD Update 02/17/23: TBD  Last 3 [place] Suicide Severity Risk Score: Flowsheet Row Admission (Current) from 08/22/2022 in [facility] ED from 08/17/2022 in [facility] Emergency Department at [facility] ED from 08/12/2022 in [facility] Emergency Department at [facility]  C-SSRS RISK CATEGORY Low Risk No Risk No Risk       Last [facility] 2/9 Scores:     No data to display          Scribe for Treatment Team: Elza Rafter, Theresia Majors 02/17/2023 10:16 AM

## 2023-02-17 NOTE — Group Note (Signed)
  Date:  02/17/2023 Time:  11:38 PM  Group Topic/Focus:  Coping With Mental Health Crisis:   The purpose of this group is to help patients identify strategies for coping with mental health crisis.  Group discusses possible causes of crisis and ways to manage them effectively.    Participation Level:  Did Not Attend  Participation Quality:      Affect:      Cognitive:      Insight: None  Engagement in Group:  None  Modes of Intervention:      Additional Comments:    [name] 02/17/2023, 11:38 PM

## 2023-02-17 NOTE — Group Note (Signed)
Date:  02/17/2023 Time:  10:45 AM  Group Topic/Focus:  Coping With Mental Health Crisis:   The purpose of this group is to help patients identify strategies for coping with mental health crisis.  Group discusses possible causes of crisis and ways to manage them effectively.    Participation Level:  Active  Participation Quality:  Appropriate  Affect:  Appropriate  Cognitive:  Appropriate  Insight: Appropriate  Engagement in Group:  Engaged  Modes of Intervention:  Discussion   Ardelle Anton 02/17/2023, 10:45 AM

## 2023-02-18 DIAGNOSIS — F250 Schizoaffective disorder, bipolar type: Secondary | ICD-10-CM | POA: Diagnosis not present

## 2023-02-18 NOTE — Progress Notes (Signed)
[facility] ([facility]) MD Progress Note  Betty Henderson  MRN:  [id number]  Subjective: Case discussed in multidisciplinary meeting today, chart reviewed, patient seen today during rounds. Social worker is working with DSS on placement.  Social worker informed that patient have court hearing for guardianship Jan 14th, 2025  Patient reports she is doing "fine" today.  He shared that Dr. Marlou Porch has started her on medicine to lose weight.  Patient has been started on Topamax and Zonegran by Dr. Marlou Porch yesterday.  Patient is tolerating medicine fine without side effects.  No new acute issues overnight reported by the staff.  Patient was informed that DSS is working on getting guardianship.  Patient has been attending  few groups and participating in milieu.  She is interacting well with her peers.  Patient denies auditory or visual hallucinations.    Principal Problem: Schizoaffective disorder, bipolar type (HCC) Diagnosis: Principal Problem:   Schizoaffective disorder, bipolar type (HCC)   Past Psychiatric History: Schizoaffective disorder, bipolar type.  Past Medical History:  Past Medical History:  Diagnosis Date   Anemia    Arthritis    Chronic pain    Drug-seeking behavior    Malingering    Osteopetrosis    Psychosis (HCC)    Schizoaffective disorder, bipolar type (HCC)     Past Surgical History:  Procedure Laterality Date   MOUTH SURGERY     TUBAL LIGATION     Family History:  Family History  Family history unknown: Yes   Family Psychiatric  History: Unremarkable Social History:  Social History   Substance and Sexual Activity  Alcohol Use Not Currently   Comment: 1 cocktail 3 weeks ago     Social History   Substance and Sexual Activity  Drug Use Not Currently   Types: Cocaine   Comment: states "it's legal though"    Social History   Socioeconomic History   Marital status: Single    Spouse name: Not on file   Number of children: Not on file   Years of education: Not on  file   Highest education level: Not on file  Occupational History   Not on file  Tobacco Use   Smoking status: Every Day    Current packs/day: 0.50    Types: Cigarettes   Smokeless tobacco: Never  Vaping Use   Vaping status: Never Used  Substance and Sexual Activity   Alcohol use: Not Currently    Comment: 1 cocktail 3 weeks ago   Drug use: Not Currently    Types: Cocaine    Comment: states "it's legal though"   Sexual activity: Never  Other Topics Concern   Not on file  Social History Narrative   Not on file   Social Determinants of Health   Financial Resource Strain: Not on file  Food Insecurity: Food Insecurity Present (08/22/2022)   Hunger Vital Sign    Worried About Running Out of Food in the Last Year: Sometimes true    Ran Out of Food in the Last Year: Sometimes true  Transportation Needs: Patient Unable To Answer (08/22/2022)   PRAPARE - Transportation    Lack of Transportation (Medical): Patient unable to answer    Lack of Transportation (Non-Medical): Patient unable to answer  Recent Concern: Transportation Needs - Unmet Transportation Needs (06/13/2022)   Received from [facility], [facility]   [facility] - Transportation    Lack of Transportation (Medical): Not on file    Lack of Transportation (Non-Medical): Yes  Physical Activity: Not on file  Stress: Not on file  Social Connections: Unknown (08/06/2021)   Received from [facility], [facility]   Social Network    Social Network: Not on file   Additional Social History:     Patient is homeless at this time.  Social worker is working with DSS on [employer] application and  placement.                    Sleep Good  Appetite:  Good  Current Medications: Current Facility-Administered Medications  Medication Dose Route Frequency Provider Last Rate Last Admin   acetaminophen (TYLENOL) tablet 650 mg  650 mg Oral Q6H PRN [name], DO   650 mg at 02/18/23 0918   alum & mag  hydroxide-simeth (MAALOX/MYLANTA) 200-200-20 MG/5ML suspension 30 mL  30 mL Oral Q4H PRN [name], DO   30 mL at 02/17/23 2211   aspirin EC tablet 81 mg  81 mg Oral Daily [name], MD   81 mg at 02/18/23 1610   atorvastatin (LIPITOR) tablet 20 mg  20 mg Oral Daily [name], MD   20 mg at 02/18/23 9604   diphenhydrAMINE (BENADRYL) capsule 50 mg  50 mg Oral Q6H PRN [name], DO   50 mg at 09/29/22 2254   Or   diphenhydrAMINE (BENADRYL) injection 50 mg  50 mg Intramuscular Q6H PRN [name], DO       glimepiride (AMARYL) tablet 8 mg  8 mg Oral Q breakfast [name], MD   8 mg at 02/18/23 5409   haloperidol (HALDOL) tablet 5 mg  5 mg Oral Q6H PRN [name], DO   5 mg at 09/28/22 2149   Or   haloperidol lactate (HALDOL) injection 5 mg  5 mg Intramuscular Q6H PRN [name], DO       ibuprofen (ADVIL) tablet 600 mg  600 mg Oral Q6H PRN [name], [name], MD   600 mg at 02/17/23 2103   linagliptin (TRADJENTA) tablet 5 mg  5 mg Oral Daily [name], MD   5 mg at 02/18/23 8119   LORazepam (ATIVAN) tablet 1 mg  1 mg Oral TID PRN [name], DO   1 mg at 01/23/23 2133   magnesium hydroxide (MILK OF MAGNESIA) suspension 30 mL  30 mL Oral Daily PRN [name], DO   30 mL at 11/08/22 1012   metFORMIN (GLUCOPHAGE) tablet 1,000 mg  1,000 mg Oral BH-q8a4p [name], DO   1,000 mg at 02/18/23 1705   oxybutynin (DITROPAN) tablet 5 mg  5 mg Oral BID [name], MD   5 mg at 02/18/23 0918   paliperidone (INVEGA SUSTENNA) injection 156 mg  156 mg Intramuscular Q28 days [name], MD   156 mg at 02/12/23 1010   topiramate (TOPAMAX) tablet 25 mg  25 mg Oral BH-q8a4p [name], DO   25 mg at 02/18/23 1705   traZODone (DESYREL) tablet 50 mg  50 mg Oral QHS PRN [name], DO   50 mg at 02/17/23 2103   zonisamide (ZONEGRAN) capsule 100 mg  100 mg Oral QHS  [name], DO   100 mg at 02/17/23 2104    Lab Results:  Results for orders placed or performed during the hospital encounter of 08/22/22 (from the past 48 hour(s))  Glucose, capillary     Status: Abnormal   Collection Time: 02/16/23  8:06 PM  Result Value  Ref Range   Glucose-Capillary 252 (H) 70 - 99 mg/dL    Comment: Glucose reference range applies only to samples taken after fasting for at least 8 hours.  Glucose, capillary     Status: Abnormal   Collection Time: 02/17/23  7:37 AM  Result Value Ref Range   Glucose-Capillary 183 (H) 70 - 99 mg/dL    Comment: Glucose reference range applies only to samples taken after fasting for at least 8 hours.  Glucose, capillary     Status: Abnormal   Collection Time: 02/17/23  8:07 PM  Result Value Ref Range   Glucose-Capillary 238 (H) 70 - 99 mg/dL    Comment: Glucose reference range applies only to samples taken after fasting for at least 8 hours.     Blood Alcohol level:  Lab Results  Component Value Date   ETH <10 08/17/2022   ETH <10 01/11/2021      Musculoskeletal: Strength & Muscle Tone: within normal limits Gait & Station: normal Patient leans: N/A   Psychiatric Specialty Exam:   Presentation  General Appearance:  Casual; Neat   Eye Contact: Fair   Speech: Spontaneous   Speech Volume: Normal   Handedness: Right     Mood and Affect  Mood: "Fine"   Affect: Stable and pleasant    Thought Processes: Improved, at baseline   Descriptions of Associations: Intact   Orientation: Well-oriented   Thought Content: Improved, at baseline  Hallucinations:Denies, occasionally heard talking to self in her room  Ideas of Reference:None noted   Suicidal Thoughts:Denies SI, no intention or plan  Homicidal Thoughts:Denies HI, no intention or plan   Sensorium  Memory: Immediate Fair; Remote Poor   Judgment: Fair, pt is Rx compliant   Insight: Improved     Executive Functions   Concentration: Improved   Attention Span: Improved   Language: Fair     Psychomotor Activity  Psychomotor Activity: Normal  Assets  Assets: Communication Skills     Sleep  Sleep:Improved     Physical Exam: Physical Exam Vitals and nursing note reviewed.  Constitutional:      Appearance: Normal appearance. She is normal weight.  Neurological:     General: No focal deficit present.     Mental Status: She is alert.      Review of Systems  Constitutional: Negative.   HENT: Negative.    Eyes: Negative.   Respiratory: Negative.    Cardiovascular: Negative.   Gastrointestinal: Negative.   Genitourinary: Negative.   Musculoskeletal: Negative.   Skin: Negative.   Neurological: Negative.   Endo/Heme/Allergies: Negative.     Blood pressure 97/60, pulse 67, temperature 98 F (36.7 C), resp. rate 14, height 5\' 6"  (1.676 m), weight (P) 82.8 kg, SpO2 98%. Body mass index is 29.46 kg/m (pended).   Treatment Plan Summary: Daily contact with patient to assess and evaluate symptoms and progress in treatment, Medication management, and Plan continue current medications.   Continue to monitor patient on as needed meds   Patient received  dose of Invega Sustenna 156 mg IM on  01/15/23 Oxybutynin 5 mg by mouth twice daily to help with urinary incontinence Continue on Metformin and Amaryl.   Continue on atorvastatin for Mixed hyperlipidemia Topamax and Zonegran started 02/17/2023   Patient is awaiting placement    [name], MD

## 2023-02-18 NOTE — Group Note (Signed)
[facility] LCSW Group Therapy Note   Group Date: 02/18/2023 Start Time: 1300 End Time: 1400  Type of Therapy/Topic:  Group Therapy:  Feelings about Diagnosis  Participation Level:  Active   Mood: Calm + Attentive    Description of Group:    This group will allow patients to explore their thoughts and feelings about diagnoses they have received. Patients will be guided to explore their level of understanding and acceptance of these diagnoses. Facilitator will encourage patients to process their thoughts and feelings about the reactions of others to their diagnosis, and will guide patients in identifying ways to discuss their diagnosis with significant others in their lives. This group will be process-oriented, with patients participating in exploration of their own experiences as well as giving and receiving support and challenge from other group members.   Therapeutic Goals: 1. Patient will demonstrate understanding of diagnosis as evidence by identifying two or more symptoms of the disorder:  2. Patient will be able to express two feelings regarding the diagnosis 3. Patient will demonstrate ability to communicate their needs through discussion and/or role plays  Summary of Patient Progress:    Pt was quiet but agreed with other pt's throughout group    Therapeutic Modalities:   Cognitive Behavioral Therapy Brief Therapy Feelings Identification    Elza Rafter, LCSWA

## 2023-02-18 NOTE — Group Note (Signed)
Date:  02/18/2023 Time:  10:43 AM  Group Topic/Focus:  Coping With Mental Health Crisis:   The purpose of this group is to help patients identify strategies for coping with mental health crisis.  Group discusses possible causes of crisis and ways to manage them effectively. Goals Group:   The focus of this group is to help patients establish daily goals to achieve during treatment and discuss how the patient can incorporate goal setting into their daily lives to aide in recovery. Healthy Communication:   The focus of this group is to discuss communication, barriers to communication, as well as healthy ways to communicate with others. Personal Development- We focused on ways to better cope with stressors and challenges.    Participation Level:  Active  Participation Quality:  Appropriate  Affect:  Appropriate  Cognitive:  Appropriate  Insight: Appropriate  Engagement in Group:  Engaged  Modes of Intervention:  Discussion  Additional Comments:    Curtis Uriarte L Reedy Biernat 02/18/2023, 10:43 AM

## 2023-02-18 NOTE — Group Note (Signed)
Date:  02/18/2023 Time:  10:01 PM  Group Topic/Focus:  Overcoming Stress:   The focus of this group is to define stress and help patients assess their triggers. Personal Choices and Values:   The focus of this group is to help patients assess and explore the importance of values in their lives, how their values affect their decisions, how they express their values and what opposes their expression.    Participation Level:  Did Not Attend  Participation Quality:      Affect:      Cognitive:      Insight: None  Engagement in Group:      Modes of Intervention:      Additional Comments:    [name] 02/18/2023, 10:01 PM

## 2023-02-18 NOTE — Progress Notes (Signed)
   02/18/23 0918  Psych Admission Type (Psych Patients Only)  Admission Status Voluntary  Psychosocial Assessment  Patient Complaints Depression  Eye Contact Brief  Facial Expression Blank  Affect Flat  Speech Logical/coherent  Interaction Assertive  Motor Activity Shuffling  Appearance/Hygiene Layered clothes  Behavior Characteristics Cooperative  Mood Sullen  Thought Process  Coherency WDL  Content WDL  Delusions None reported or observed  Perception WDL  Hallucination None reported or observed  Judgment Impaired  Confusion None  Danger to Self  Current suicidal ideation? Denies  Danger to Others  Danger to Others None reported or observed  Danger to Others Abnormal  Harmful Behavior to others No threats or harm toward other people

## 2023-02-18 NOTE — Plan of Care (Signed)
  Problem: Education: Goal: Knowledge of General Education information will improve Description: Including pain rating scale, medication(s)/side effects and non-pharmacologic comfort measures Outcome: Progressing   Problem: Health Behavior/Discharge Planning: Goal: Ability to manage health-related needs will improve Outcome: Progressing   Problem: Clinical Measurements: Goal: Ability to maintain clinical measurements within normal limits will improve Outcome: Progressing Goal: Diagnostic test results will improve Outcome: Progressing   Problem: Nutrition: Goal: Adequate nutrition will be maintained Outcome: Progressing   Problem: Coping: Goal: Level of anxiety will decrease Outcome: Progressing   Problem: Elimination: Goal: Will not experience complications related to bowel motility Outcome: Progressing Goal: Will not experience complications related to urinary retention Outcome: Progressing   Problem: Pain Managment: Goal: General experience of comfort will improve Outcome: Progressing   Problem: Safety: Goal: Ability to remain free from injury will improve Outcome: Progressing   Problem: Skin Integrity: Goal: Risk for impaired skin integrity will decrease Outcome: Progressing

## 2023-02-18 NOTE — Group Note (Signed)
Recreation Therapy Group Note   Group Topic:Stress Management  Group Date: 02/18/2023 Start Time: 1400 End Time: 1450 Facilitators: Rosina Lowenstein, LRT, CTRS Location: Courtyard  Group Description: Meditation. LRT and patients discussed what they know about meditation and mindfulness. LRT played a Deep Breathing Meditation exercise script for patients to follow along to. LRT and patients discussed how meditation and deep breathing can be used as a coping skill post--discharge to help manage symptoms of stress.  Goal Area(s) Addressed: Patient will practice using relaxation technique. Patient will identify a new coping skill.  Patient will follow multistep directions to reduce anxiety and stress.   Affect/Mood: Appropriate   Participation Level: Active and Engaged   Participation Quality: Independent   Behavior: Appropriate, Calm, and Cooperative   Speech/Thought Process: Coherent   Insight: Good   Judgement: Good   Modes of Intervention: Education and Exploration   Patient Response to Interventions:  Attentive, Engaged, Interested , and Receptive   Education Outcome:  Acknowledges education   Clinical Observations/Individualized Feedback: Betty Henderson was active in their participation of session activities and group discussion. Pt identified "that was good". Pt shared that she hopes to be out by Christmas and is looking forward to going shopping. Pt also shared that she is getting tired of eating the same food over and over. Pt interacted well with LRT and peers duration of session.    Plan: Continue to engage patient in RT group sessions 2-3x/week.   Rosina Lowenstein, LRT, CTRS 02/18/2023 2:53 PM

## 2023-02-18 NOTE — Plan of Care (Signed)
  Problem: Education: Goal: Knowledge of General Education information will improve Description: Including pain rating scale, medication(s)/side effects and non-pharmacologic comfort measures Outcome: Progressing   Problem: Health Behavior/Discharge Planning: Goal: Ability to manage health-related needs will improve Outcome: Progressing   Problem: Clinical Measurements: Goal: Ability to maintain clinical measurements within normal limits will improve Outcome: Progressing Patient compliant with medications this shift denies SI/HI/A/VH and verbally contracts for safety. Patient present with sad affect. Support and encouragement provided as needed. Prn Maalox given at HS for Indigestion.

## 2023-02-19 DIAGNOSIS — F250 Schizoaffective disorder, bipolar type: Secondary | ICD-10-CM | POA: Diagnosis not present

## 2023-02-19 NOTE — Plan of Care (Signed)
  Problem: Education: Goal: Knowledge of General Education information will improve Description: Including pain rating scale, medication(s)/side effects and non-pharmacologic comfort measures Outcome: Progressing   Problem: Health Behavior/Discharge Planning: Goal: Ability to manage health-related needs will improve Outcome: Progressing   Problem: Clinical Measurements: Goal: Ability to maintain clinical measurements within normal limits will improve Outcome: Progressing Goal: Diagnostic test results will improve Outcome: Progressing   Problem: Nutrition: Goal: Adequate nutrition will be maintained Outcome: Progressing   Problem: Coping: Goal: Level of anxiety will decrease Outcome: Progressing   Problem: Elimination: Goal: Will not experience complications related to bowel motility Outcome: Progressing Goal: Will not experience complications related to urinary retention Outcome: Progressing   Problem: Pain Managment: Goal: General experience of comfort will improve Outcome: Progressing   Problem: Safety: Goal: Ability to remain free from injury will improve Outcome: Progressing   Problem: Skin Integrity: Goal: Risk for impaired skin integrity will decrease Outcome: Progressing

## 2023-02-19 NOTE — Progress Notes (Signed)
[facility] MD Progress Note  Betty Henderson  MRN:  [id number]  Subjective: Case discussed in multidisciplinary meeting today, chart reviewed, patient seen today during rounds. Social worker is working with DSS on placement.  Social worker informed that patient have court hearing for guardianship Jan 14th, 2025  Patient reports she is doing "fine" today.   No new acute issues overnight reported by the staff.  Patient was informed that DSS is working on getting guardianship.  Patient has been attending  few groups and participating in milieu.  She is interacting well with her peers.  Patient denies auditory or visual hallucinations.    Principal Problem: Schizoaffective disorder, bipolar type (HCC) Diagnosis: Principal Problem:   Schizoaffective disorder, bipolar type (HCC)   Past Psychiatric History: Schizoaffective disorder, bipolar type.  Past Medical History:  Past Medical History:  Diagnosis Date   Anemia    Arthritis    Chronic pain    Drug-seeking behavior    Malingering    Osteopetrosis    Psychosis (HCC)    Schizoaffective disorder, bipolar type (HCC)     Past Surgical History:  Procedure Laterality Date   MOUTH SURGERY     TUBAL LIGATION     Family History:  Family History  Family history unknown: Yes   Family Psychiatric  History: Unremarkable Social History:  Social History   Substance and Sexual Activity  Alcohol Use Not Currently   Comment: 1 cocktail 3 weeks ago     Social History   Substance and Sexual Activity  Drug Use Not Currently   Types: Cocaine   Comment: states "it's legal though"    Social History   Socioeconomic History   Marital status: Single    Spouse name: Not on file   Number of children: Not on file   Years of education: Not on file   Highest education level: Not on file  Occupational History   Not on file  Tobacco Use   Smoking status: Every Day    Current packs/day: 0.50    Types: Cigarettes   Smokeless tobacco: Never   Vaping Use   Vaping status: Never Used  Substance and Sexual Activity   Alcohol use: Not Currently    Comment: 1 cocktail 3 weeks ago   Drug use: Not Currently    Types: Cocaine    Comment: states "it's legal though"   Sexual activity: Never  Other Topics Concern   Not on file  Social History Narrative   Not on file   Social Determinants of Health   Financial Resource Strain: Not on file  Food Insecurity: Food Insecurity Present (08/22/2022)   Hunger Vital Sign    Worried About Running Out of Food in the Last Year: Sometimes true    Ran Out of Food in the Last Year: Sometimes true  Transportation Needs: Patient Unable To Answer (08/22/2022)   PRAPARE - Transportation    Lack of Transportation (Medical): Patient unable to answer    Lack of Transportation (Non-Medical): Patient unable to answer  Recent Concern: Transportation Needs - Unmet Transportation Needs (06/13/2022)   Received from [facility]   [facility] - Transportation    Lack of Transportation (Medical): Not on file    Lack of Transportation (Non-Medical): Yes  Physical Activity: Not on file  Stress: Not on file  Social Connections: Unknown (08/06/2021)   Received from [facility], [facility]   Social Network    Social Network: Not on file   Additional Social History:  Patient is homeless at this time.  Social worker is working with DSS on [employer] application and  placement.                    Sleep Good  Appetite:  Good  Current Medications: Current Facility-Administered Medications  Medication Dose Route Frequency Provider Last Rate Last Admin   acetaminophen (TYLENOL) tablet 650 mg  650 mg Oral Q6H PRN [name], DO   650 mg at 02/18/23 0918   alum & mag hydroxide-simeth (MAALOX/MYLANTA) 200-200-20 MG/5ML suspension 30 mL  30 mL Oral Q4H PRN [name], DO   30 mL at 02/17/23 2211   aspirin EC tablet 81 mg  81 mg Oral Daily [name], MD   81  mg at 02/19/23 0905   atorvastatin (LIPITOR) tablet 20 mg  20 mg Oral Daily [name], MD   20 mg at 02/19/23 0905   diphenhydrAMINE (BENADRYL) capsule 50 mg  50 mg Oral Q6H PRN [name], DO   50 mg at 09/29/22 2254   Or   diphenhydrAMINE (BENADRYL) injection 50 mg  50 mg Intramuscular Q6H PRN [name], DO       glimepiride (AMARYL) tablet 8 mg  8 mg Oral Q breakfast [name], MD   8 mg at 02/19/23 6948   haloperidol (HALDOL) tablet 5 mg  5 mg Oral Q6H PRN [name], DO   5 mg at 09/28/22 2149   Or   haloperidol lactate (HALDOL) injection 5 mg  5 mg Intramuscular Q6H PRN [name], DO       ibuprofen (ADVIL) tablet 600 mg  600 mg Oral Q6H PRN [name], [name], MD   600 mg at 02/17/23 2103   linagliptin (TRADJENTA) tablet 5 mg  5 mg Oral Daily [name], MD   5 mg at 02/19/23 5462   LORazepam (ATIVAN) tablet 1 mg  1 mg Oral TID PRN [name], DO   1 mg at 01/23/23 2133   magnesium hydroxide (MILK OF MAGNESIA) suspension 30 mL  30 mL Oral Daily PRN [name], DO   30 mL at 11/08/22 1012   metFORMIN (GLUCOPHAGE) tablet 1,000 mg  1,000 mg Oral BH-q8a4p [name], DO   1,000 mg at 02/19/23 0904   oxybutynin (DITROPAN) tablet 5 mg  5 mg Oral BID [name], MD   5 mg at 02/19/23 0905   paliperidone (INVEGA SUSTENNA) injection 156 mg  156 mg Intramuscular Q28 days [name], MD   156 mg at 02/12/23 1010   topiramate (TOPAMAX) tablet 25 mg  25 mg Oral BH-q8a4p [name], DO   25 mg at 02/19/23 7035   traZODone (DESYREL) tablet 50 mg  50 mg Oral QHS PRN [name], DO   50 mg at 02/18/23 2058   zonisamide (ZONEGRAN) capsule 100 mg  100 mg Oral QHS [name], DO   100 mg at 02/18/23 2059    Lab Results:  Results for orders placed or performed during the hospital encounter of 08/22/22 (from the past 48 hour(s))  Glucose, capillary      Status: Abnormal   Collection Time: 02/17/23  8:07 PM  Result Value Ref Range   Glucose-Capillary 238 (H) 70 - 99 mg/dL    Comment: Glucose reference range applies only to samples taken after fasting for at least 8 hours.     Blood Alcohol level:  Lab Results  Component Value Date   ETH <10 08/17/2022   ETH <10 01/11/2021      Musculoskeletal: Strength & Muscle Tone: within normal limits Gait & Station: normal Patient leans: N/A   Psychiatric Specialty Exam:   Presentation  General Appearance:  Casual; Neat   Eye Contact: Fair   Speech: Spontaneous   Speech Volume: Normal   Handedness: Right     Mood and Affect  Mood: "Fine"   Affect: Stable and pleasant    Thought Processes: Improved, at baseline   Descriptions of Associations: Intact   Orientation: Well-oriented   Thought Content: Improved, at baseline  Hallucinations:Denies, occasionally heard talking to self in her room  Ideas of Reference:None noted   Suicidal Thoughts:Denies SI, no intention or plan  Homicidal Thoughts:Denies HI, no intention or plan   Sensorium  Memory: Immediate Fair; Remote Poor   Judgment: Fair, pt is Rx compliant   Insight: Improved     Executive Functions  Concentration: Improved   Attention Span: Improved   Language: Fair     Psychomotor Activity  Psychomotor Activity: Normal  Assets  Assets: Communication Skills     Sleep  Sleep:Improved     Physical Exam: Physical Exam Vitals and nursing note reviewed.  Constitutional:      Appearance: Normal appearance. She is normal weight.  Neurological:     General: No focal deficit present.     Mental Status: She is alert.      Review of Systems  Constitutional: Negative.   HENT: Negative.    Eyes: Negative.   Respiratory: Negative.    Cardiovascular: Negative.   Gastrointestinal: Negative.   Genitourinary: Negative.   Musculoskeletal: Negative.   Skin: Negative.   Neurological:  Negative.   Endo/Heme/Allergies: Negative.     Blood pressure 114/64, pulse (!) 103, temperature (!) 97.5 F (36.4 C), resp. rate 16, height 5\' 6"  (1.676 m), weight (P) 82.8 kg, SpO2 96%. Body mass index is 29.46 kg/m (pended).   Treatment Plan Summary: Daily contact with patient to assess and evaluate symptoms and progress in treatment, Medication management, and Plan continue current medications.   Continue to monitor patient on as needed meds   Patient received  dose of Invega Sustenna 156 mg IM on  01/15/23 Oxybutynin 5 mg by mouth twice daily to help with urinary incontinence Continue on Metformin and Amaryl.   Continue on atorvastatin for Mixed hyperlipidemia Topamax and Zonegran started 02/17/2023   Patient is awaiting placement    [name], MD

## 2023-02-19 NOTE — Group Note (Unsigned)
Date:  02/19/2023 Time:  11:02 AM  Group Topic/Focus:  Socialize / Activity     Participation Level:  {BHH PARTICIPATION KGMWN:02725}  Participation Quality:  {BHH PARTICIPATION QUALITY:22265}  Affect:  {BHH AFFECT:22266}  Cognitive:  {BHH COGNITIVE:22267}  Insight: {BHH Insight2:20797}  Engagement in Group:  {BHH ENGAGEMENT IN DGUYQ:03474}  Modes of Intervention:  {BHH MODES OF INTERVENTION:22269}  Additional Comments:  ***  Rodena Goldmann 02/19/2023, 11:02 AM

## 2023-02-19 NOTE — Progress Notes (Signed)
Patient observed lying in bed, resting at shift change. Patient BP 90/60, this writer encouraged patient to ambulated to dayroom, BP retaken-123/86 and patient ate a snack and ambulated back to her room. Patient answered assessment questions appropriately. Patient denies SI/HI/AVH and anxiety. Patient complains of (L) lower leg pain (5) and received PRN Tylenol 650 mg PO. Medication effective, patient observed lying in bed, asleep when reassessed. Patient med compliant, no acute distress noted. Routine observations to continue to monitor patient safety.

## 2023-02-19 NOTE — Group Note (Signed)
Date:  02/19/2023 Time:  11:06 AM  Group Topic/Focus:  Socialize / Activity    Participation Level:  Active  Participation Quality:  Appropriate  Affect:  Appropriate  Cognitive:  Appropriate  Insight: Appropriate  Engagement in Group:  Engaged  Modes of Intervention:  Activity  Additional Comments:  none  Rodena Goldmann 02/19/2023, 11:06 AM

## 2023-02-19 NOTE — BHH Counselor (Signed)
CSW received successful fax report from Abby at [facility] [place], [phone]   Reynaldo Minium, MSW, [facility] 02/19/2023 2:06 PM

## 2023-02-19 NOTE — Group Note (Signed)
Recreation Therapy Group Note   Group Topic:Emotion Expression  Group Date: 02/19/2023 Start Time: 1345 End Time: 1445 Facilitators: Rosina Lowenstein, LRT, CTRS Location:  Dayroom  Group Description: Gratitude Journaling. Patients and LRT discussed what gratitude means, how we can express it and what it means to [place], personally. LRT gave an education handout on the definition of gratitude that also gave different examples of gratitude exercises that they could try. One of the examples was "Gratitude Letter", which prompted patient to write a letter to someone they appreciate. LRT played soft music while everyone wrote their letter. Once letter was completed, LRT encouraged people to read their letter if they wanted to; or share who they wrote it to, at minimum. LRT and pts talked about the benefits of journaling and how it can be used as a positive coping skill. LRT and pts processed how showing gratitude towards themselves, and others can be applied to everyday life post-discharge. LRT offered journals to pts afterwards.   Goal Area(s) Addressed:  Patient will identify the definition of gratitude. Patient will learn different gratitude exercises. Patient will practice writing/journaling as a coping skill.  Patient will identify a new coping skill.   Affect/Mood: Appropriate   Participation Level: Active and Engaged   Participation Quality: Independent   Behavior: Calm and Cooperative   Speech/Thought Process: Loose association   Insight: Fair   Judgement: Fair    Modes of Intervention: Activity, Education, and Writing   Patient Response to Interventions:  Receptive and Requested additional information/resources    Education Outcome:  Acknowledges education   Clinical Observations/Individualized Feedback: Ambra was active in their participation of session activities and group discussion. Pt identified "my father since I am a princess. A princess needs their dad, you know" as  who she wrote her letter to. Pt received a journal afterwards. Pt interacted well with LRT and peers duration of session.    Plan: Continue to engage patient in RT group sessions 2-3x/week.   Rosina Lowenstein, LRT, CTRS 02/19/2023 2:54 PM

## 2023-02-19 NOTE — Plan of Care (Signed)
D: Pt alert and oriented. Pt denies experiencing any anxiety/depression at this time. Pt denies experiencing any pain at this time. Pt denies experiencing any SI/HI, or AVH at this time.   A: Scheduled medications administered to pt, per MD orders. Support and encouragement provided. Frequent verbal contact made. Routine safety checks conducted q15 minutes.   R: No adverse drug reactions noted. Pt verbally contracts for safety at this time. Pt compliant with medications and treatment plan. Pt interacts well with others on the unit. Pt remains safe at this time. Plan of care ongoing.  Problem: Education: Goal: Knowledge of General Education information will improve Description: Including pain rating scale, medication(s)/side effects and non-pharmacologic comfort measures Outcome: Progressing   Problem: Nutrition: Goal: Adequate nutrition will be maintained Outcome: Progressing

## 2023-02-19 NOTE — Group Note (Unsigned)
Date:  02/19/2023 Time:  11:04 AM  Group Topic/Focus:  Socialize /  Activity     Participation Level:  {BHH PARTICIPATION VWUJW:11914}  Participation Quality:  {BHH PARTICIPATION QUALITY:22265}  Affect:  {BHH AFFECT:22266}  Cognitive:  {BHH COGNITIVE:22267}  Insight: {BHH Insight2:20797}  Engagement in Group:  {BHH ENGAGEMENT IN NWGNF:62130}  Modes of Intervention:  {BHH MODES OF INTERVENTION:22269}  Additional Comments:  ***  Betty Henderson 02/19/2023, 11:04 AM

## 2023-02-20 DIAGNOSIS — F250 Schizoaffective disorder, bipolar type: Secondary | ICD-10-CM | POA: Diagnosis not present

## 2023-02-20 LAB — GLUCOSE, CAPILLARY: Glucose-Capillary: 139 mg/dL — ABNORMAL HIGH (ref 70–99)

## 2023-02-20 NOTE — Group Note (Signed)
Date:  02/20/2023 Time:  9:35 PM  Group Topic/Focus:  Self Care:   The focus of this group is to help patients understand the importance of self-care in order to improve or restore emotional, physical, spiritual, interpersonal, and financial health.    Participation Level:  Active  Participation Quality:  Appropriate  Affect:  Appropriate  Cognitive:  Appropriate  Insight: Appropriate  Engagement in Group:  Engaged  Modes of Intervention:  Support  Additional Comments:    [name] 02/20/2023, 9:35 PM

## 2023-02-20 NOTE — Progress Notes (Signed)
[facility] MD Progress Note  Betty Henderson  MRN:  [id number]  Subjective: Case discussed in multidisciplinary meeting today, chart reviewed, patient seen today during rounds.  Social worker informed that patient has been assigned a guardian via court, the final/official  court hearing is on Jan 14th, 2025  Patient reports she is doing "fine" today.   No new acute issues overnight reported by the staff.  Patient was informed that she has been assigned a guardian via court, the final/official  court hearing is on Jan 14th, 2025.  Patient was excited and happy to hear the development in her DSS case.  Patient has been attending  few groups and participating in milieu.  She is interacting well with her peers.  Patient denies auditory or visual hallucinations.    Principal Problem: Schizoaffective disorder, bipolar type (HCC) Diagnosis: Principal Problem:   Schizoaffective disorder, bipolar type (HCC)   Past Psychiatric History: Schizoaffective disorder, bipolar type.  Past Medical History:  Past Medical History:  Diagnosis Date   Anemia    Arthritis    Chronic pain    Drug-seeking behavior    Malingering    Osteopetrosis    Psychosis (HCC)    Schizoaffective disorder, bipolar type (HCC)     Past Surgical History:  Procedure Laterality Date   MOUTH SURGERY     TUBAL LIGATION     Family History:  Family History  Family history unknown: Yes   Family Psychiatric  History: Unremarkable Social History:  Social History   Substance and Sexual Activity  Alcohol Use Not Currently   Comment: 1 cocktail 3 weeks ago     Social History   Substance and Sexual Activity  Drug Use Not Currently   Types: Cocaine   Comment: states "it's legal though"    Social History   Socioeconomic History   Marital status: Single    Spouse name: Not on file   Number of children: Not on file   Years of education: Not on file   Highest education level: Not on file  Occupational History   Not on  file  Tobacco Use   Smoking status: Every Day    Current packs/day: 0.50    Types: Cigarettes   Smokeless tobacco: Never  Vaping Use   Vaping status: Never Used  Substance and Sexual Activity   Alcohol use: Not Currently    Comment: 1 cocktail 3 weeks ago   Drug use: Not Currently    Types: Cocaine    Comment: states "it's legal though"   Sexual activity: Never  Other Topics Concern   Not on file  Social History Narrative   Not on file   Social Determinants of Health   Financial Resource Strain: Not on file  Food Insecurity: Food Insecurity Present (08/22/2022)   Hunger Vital Sign    Worried About Running Out of Food in the Last Year: Sometimes true    Ran Out of Food in the Last Year: Sometimes true  Transportation Needs: Patient Unable To Answer (08/22/2022)   PRAPARE - Transportation    Lack of Transportation (Medical): Patient unable to answer    Lack of Transportation (Non-Medical): Patient unable to answer  Recent Concern: Transportation Needs - Unmet Transportation Needs (06/13/2022)   Received from [facility]   [facility] - Transportation    Lack of Transportation (Medical): Not on file    Lack of Transportation (Non-Medical): Yes  Physical Activity: Not on file  Stress: Not on file  Social Connections:  Unknown (08/06/2021)   Received from [facility], [facility]   Social Network    Social Network: Not on file   Additional Social History:     Patient is homeless at this time.  Social worker is working with DSS on [employer] application and  placement.                    Sleep Good  Appetite:  Good  Current Medications: Current Facility-Administered Medications  Medication Dose Route Frequency Provider Last Rate Last Admin   acetaminophen (TYLENOL) tablet 650 mg  650 mg Oral Q6H PRN [name], DO   650 mg at 02/19/23 2128   alum & mag hydroxide-simeth (MAALOX/MYLANTA) 200-200-20 MG/5ML suspension 30 mL  30 mL Oral Q4H  PRN [name], DO   30 mL at 02/17/23 2211   aspirin EC tablet 81 mg  81 mg Oral Daily [name], MD   81 mg at 02/20/23 1011   atorvastatin (LIPITOR) tablet 20 mg  20 mg Oral Daily [name], MD   20 mg at 02/20/23 1011   diphenhydrAMINE (BENADRYL) capsule 50 mg  50 mg Oral Q6H PRN [name], DO   50 mg at 09/29/22 2254   Or   diphenhydrAMINE (BENADRYL) injection 50 mg  50 mg Intramuscular Q6H PRN [name], DO       glimepiride (AMARYL) tablet 8 mg  8 mg Oral Q breakfast [name], MD   8 mg at 02/20/23 1012   haloperidol (HALDOL) tablet 5 mg  5 mg Oral Q6H PRN [name], DO   5 mg at 09/28/22 2149   Or   haloperidol lactate (HALDOL) injection 5 mg  5 mg Intramuscular Q6H PRN [name], DO       ibuprofen (ADVIL) tablet 600 mg  600 mg Oral Q6H PRN [name], [name], MD   600 mg at 02/20/23 1011   linagliptin (TRADJENTA) tablet 5 mg  5 mg Oral Daily [name], MD   5 mg at 02/20/23 1011   LORazepam (ATIVAN) tablet 1 mg  1 mg Oral TID PRN [name], DO   1 mg at 01/23/23 2133   magnesium hydroxide (MILK OF MAGNESIA) suspension 30 mL  30 mL Oral Daily PRN [name], DO   30 mL at 02/19/23 2130   metFORMIN (GLUCOPHAGE) tablet 1,000 mg  1,000 mg Oral BH-q8a4p [name], DO   1,000 mg at 02/20/23 1012   oxybutynin (DITROPAN) tablet 5 mg  5 mg Oral BID [name], MD   5 mg at 02/20/23 1022   paliperidone (INVEGA SUSTENNA) injection 156 mg  156 mg Intramuscular Q28 days [name], MD   156 mg at 02/12/23 1010   topiramate (TOPAMAX) tablet 25 mg  25 mg Oral BH-q8a4p [name], DO   25 mg at 02/20/23 1011   traZODone (DESYREL) tablet 50 mg  50 mg Oral QHS PRN [name], DO   50 mg at 02/19/23 2129   zonisamide (ZONEGRAN) capsule 100 mg  100 mg Oral QHS [name], DO   100 mg at 02/19/23 2129    Lab Results:  Results for  orders placed or performed during the hospital encounter of 08/22/22 (from the past 48 hour(s))  Glucose, capillary     Status: Abnormal   Collection Time: 02/20/23  7:23 AM  Result Value Ref Range   Glucose-Capillary 139 (H)  70 - 99 mg/dL    Comment: Glucose reference range applies only to samples taken after fasting for at least 8 hours.     Blood Alcohol level:  Lab Results  Component Value Date   ETH <10 08/17/2022   ETH <10 01/11/2021      Musculoskeletal: Strength & Muscle Tone: within normal limits Gait & Station: normal Patient leans: N/A   Psychiatric Specialty Exam:   Presentation  General Appearance:  Casual; Neat   Eye Contact: Fair   Speech: Spontaneous   Speech Volume: Normal   Handedness: Right     Mood and Affect  Mood: "Fine"   Affect: Stable and pleasant    Thought Processes: Improved, at baseline   Descriptions of Associations: Intact   Orientation: Well-oriented   Thought Content: Improved, at baseline  Hallucinations:Denies, occasionally heard talking to self in her room  Ideas of Reference:None noted   Suicidal Thoughts:Denies SI, no intention or plan  Homicidal Thoughts:Denies HI, no intention or plan   Sensorium  Memory: Immediate Fair; Remote Poor   Judgment: Fair, pt is Rx compliant   Insight: Improved     Executive Functions  Concentration: Improved   Attention Span: Improved   Language: Fair     Psychomotor Activity  Psychomotor Activity: Normal  Assets  Assets: Communication Skills     Sleep  Sleep:Improved     Physical Exam: Physical Exam Vitals and nursing note reviewed.  Constitutional:      Appearance: Normal appearance. She is normal weight.  Neurological:     General: No focal deficit present.     Mental Status: She is alert.      Review of Systems  Constitutional: Negative.   HENT: Negative.    Eyes: Negative.   Respiratory: Negative.    Cardiovascular: Negative.    Gastrointestinal: Negative.   Genitourinary: Negative.   Musculoskeletal: Negative.   Skin: Negative.   Neurological: Negative.   Endo/Heme/Allergies: Negative.     Blood pressure 123/86, pulse 94, temperature 97.9 F (36.6 C), resp. rate 16, height 5\' 6"  (1.676 m), weight 86 kg, SpO2 94%. Body mass index is 30.59 kg/m.   Treatment Plan Summary: Daily contact with patient to assess and evaluate symptoms and progress in treatment, Medication management, and Plan continue current medications.   Continue to monitor patient on as needed meds   Patient received  dose of Invega Sustenna 156 mg IM on  01/15/23 Oxybutynin 5 mg by mouth twice daily to help with urinary incontinence Continue on Metformin and Amaryl.   Continue on atorvastatin for Mixed hyperlipidemia Topamax and Zonegran started 02/17/2023   Patient is awaiting placement    [name], MD

## 2023-02-20 NOTE — Plan of Care (Signed)
Problem: Education: Goal: Knowledge of General Education information will improve Description: Including pain rating scale, medication(s)/side effects and non-pharmacologic comfort measures Outcome: Progressing   Problem: Health Behavior/Discharge Planning: Goal: Ability to manage health-related needs will improve Outcome: Progressing   Problem: Clinical Measurements: Goal: Ability to maintain clinical measurements within normal limits will improve Outcome: Progressing Goal: Diagnostic test results will improve Outcome: Progressing   Problem: Nutrition: Goal: Adequate nutrition will be maintained Outcome: Progressing   Problem: Coping: Goal: Level of anxiety will decrease Outcome: Progressing   Problem: Elimination: Goal: Will not experience complications related to bowel motility Outcome: Progressing Goal: Will not experience complications related to urinary retention Outcome: Progressing   Problem: Pain Managment: Goal: General experience of comfort will improve Outcome: Progressing   Problem: Safety: Goal: Ability to remain free from injury will improve Outcome: Progressing   Problem: Skin Integrity: Goal: Risk for impaired skin integrity will decrease Outcome: Progressing

## 2023-02-20 NOTE — Progress Notes (Signed)
   02/20/23 0600  15 Minute Checks  Location Bedroom  Visual Appearance Calm  Behavior Sleeping  Sleep (Behavioral Health Patients Only)  Calculate sleep? (Click Yes once per 24 hr at 0600 safety check) Yes  Documented sleep last 24 hours 11.25

## 2023-02-20 NOTE — Progress Notes (Signed)
   02/20/23 1000  Psych Admission Type (Psych Patients Only)  Admission Status Voluntary  Psychosocial Assessment  Patient Complaints None  Eye Contact Fair  Facial Expression Blank  Affect Sullen  Speech Logical/coherent  Interaction Assertive  Motor Activity Shuffling  Appearance/Hygiene Unremarkable  Behavior Characteristics Cooperative  Mood Sad  Thought Process  Coherency WDL  Content WDL  Delusions None reported or observed  Perception WDL  Hallucination None reported or observed  Judgment Impaired  Confusion None  Danger to Self  Current suicidal ideation? Denies  Danger to Others  Danger to Others None reported or observed  Danger to Others Abnormal  Harmful Behavior to others No threats or harm toward other people

## 2023-02-20 NOTE — Plan of Care (Signed)
  Problem: Education: Goal: Knowledge of General Education information will improve Description: Including pain rating scale, medication(s)/side effects and non-pharmacologic comfort measures Outcome: Progressing   Problem: Health Behavior/Discharge Planning: Goal: Ability to manage health-related needs will improve Outcome: Progressing   Problem: Clinical Measurements: Goal: Ability to maintain clinical measurements within normal limits will improve Outcome: Progressing Goal: Diagnostic test results will improve Outcome: Progressing   Problem: Nutrition: Goal: Adequate nutrition will be maintained Outcome: Progressing   Problem: Coping: Goal: Level of anxiety will decrease Outcome: Progressing   Problem: Elimination: Goal: Will not experience complications related to bowel motility Outcome: Progressing Goal: Will not experience complications related to urinary retention Outcome: Progressing   Problem: Pain Managment: Goal: General experience of comfort will improve Outcome: Progressing   Problem: Safety: Goal: Ability to remain free from injury will improve Outcome: Progressing   Problem: Skin Integrity: Goal: Risk for impaired skin integrity will decrease Outcome: Progressing

## 2023-02-21 DIAGNOSIS — F250 Schizoaffective disorder, bipolar type: Secondary | ICD-10-CM | POA: Diagnosis not present

## 2023-02-21 NOTE — Plan of Care (Signed)
  Problem: Education: Goal: Knowledge of General Education information will improve Description: Including pain rating scale, medication(s)/side effects and non-pharmacologic comfort measures Outcome: Progressing   Problem: Health Behavior/Discharge Planning: Goal: Ability to manage health-related needs will improve Outcome: Progressing   Problem: Clinical Measurements: Goal: Ability to maintain clinical measurements within normal limits will improve Outcome: Progressing Goal: Diagnostic test results will improve Outcome: Progressing   Problem: Nutrition: Goal: Adequate nutrition will be maintained Outcome: Progressing   Problem: Coping: Goal: Level of anxiety will decrease Outcome: Progressing   Problem: Elimination: Goal: Will not experience complications related to bowel motility Outcome: Progressing Goal: Will not experience complications related to urinary retention Outcome: Progressing   Problem: Pain Managment: Goal: General experience of comfort will improve Outcome: Progressing   Problem: Safety: Goal: Ability to remain free from injury will improve Outcome: Progressing   Problem: Skin Integrity: Goal: Risk for impaired skin integrity will decrease Outcome: Progressing

## 2023-02-21 NOTE — Progress Notes (Signed)
   02/21/23 0900  Psych Admission Type (Psych Patients Only)  Admission Status Voluntary  Psychosocial Assessment  Patient Complaints None  Eye Contact Fair  Facial Expression Blank  Affect Sullen  Speech Logical/coherent  Interaction Assertive  Motor Activity Shuffling  Appearance/Hygiene Unremarkable  Behavior Characteristics Calm  Mood Depressed  Thought Process  Coherency WDL  Content WDL  Delusions None reported or observed  Perception WDL  Hallucination None reported or observed  Judgment Impaired  Confusion None  Danger to Self  Current suicidal ideation? Denies  Danger to Others  Danger to Others None reported or observed  Danger to Others Abnormal  Harmful Behavior to others No threats or harm toward other people

## 2023-02-21 NOTE — Group Note (Signed)
Date:  02/21/2023 Time:  3:01 PM  Group Topic/Focus:  Overcoming Stress:   The focus of this group is to define stress and help patients assess their triggers.    Participation Level:  Active  Participation Quality:  Attentive  Affect:  Appropriate  Cognitive:  Alert  Insight: Appropriate  Engagement in Group:  Engaged  Modes of Intervention:  Activity  Additional Comments:     Alexis Frock 02/21/2023, 3:01 PM

## 2023-02-21 NOTE — BHH Counselor (Signed)
CSW received message from Dru at [facility] stating they have declined pt because facility is at capacity.   Reynaldo Minium, MSW, [place] 02/21/2023 2:11 PM

## 2023-02-21 NOTE — Progress Notes (Signed)
[facility] MD Progress Note  Betty Henderson  MRN:  [id number]  Subjective: Case discussed in multidisciplinary meeting today, chart reviewed, patient seen today during rounds.  Social worker informed that patient has been assigned a guardian via court, the final/official  court hearing is on Jan 14th, 2025  Patient reports she is doing "fine" today.   No new acute issues overnight reported by the staff. Patient has been attending  groups and participating in milieu.  She is interacting well with her peers.  Patient denies auditory or visual hallucinations.    Principal Problem: Schizoaffective disorder, bipolar type (HCC) Diagnosis: Principal Problem:   Schizoaffective disorder, bipolar type (HCC)   Past Psychiatric History: Schizoaffective disorder, bipolar type.  Past Medical History:  Past Medical History:  Diagnosis Date   Anemia    Arthritis    Chronic pain    Drug-seeking behavior    Malingering    Osteopetrosis    Psychosis (HCC)    Schizoaffective disorder, bipolar type (HCC)     Past Surgical History:  Procedure Laterality Date   MOUTH SURGERY     TUBAL LIGATION     Family History:  Family History  Family history unknown: Yes   Family Psychiatric  History: Unremarkable Social History:  Social History   Substance and Sexual Activity  Alcohol Use Not Currently   Comment: 1 cocktail 3 weeks ago     Social History   Substance and Sexual Activity  Drug Use Not Currently   Types: Cocaine   Comment: states "it's legal though"    Social History   Socioeconomic History   Marital status: Single    Spouse name: Not on file   Number of children: Not on file   Years of education: Not on file   Highest education level: Not on file  Occupational History   Not on file  Tobacco Use   Smoking status: Every Day    Current packs/day: 0.50    Types: Cigarettes   Smokeless tobacco: Never  Vaping Use   Vaping status: Never Used  Substance and Sexual Activity    Alcohol use: Not Currently    Comment: 1 cocktail 3 weeks ago   Drug use: Not Currently    Types: Cocaine    Comment: states "it's legal though"   Sexual activity: Never  Other Topics Concern   Not on file  Social History Narrative   Not on file   Social Determinants of Health   Financial Resource Strain: Not on file  Food Insecurity: Food Insecurity Present (08/22/2022)   Hunger Vital Sign    Worried About Running Out of Food in the Last Year: Sometimes true    Ran Out of Food in the Last Year: Sometimes true  Transportation Needs: Patient Unable To Answer (08/22/2022)   PRAPARE - Transportation    Lack of Transportation (Medical): Patient unable to answer    Lack of Transportation (Non-Medical): Patient unable to answer  Recent Concern: Transportation Needs - Unmet Transportation Needs (06/13/2022)   Received from [facility]   [facility] - Transportation    Lack of Transportation (Medical): Not on file    Lack of Transportation (Non-Medical): Yes  Physical Activity: Not on file  Stress: Not on file  Social Connections: Unknown (08/06/2021)   Received from [facility], [facility]   Social Network    Social Network: Not on file   Additional Social History:     Patient is homeless at this time.  Social  worker is working with DSS on [employer] application and  placement.                    Sleep Good  Appetite:  Good  Current Medications: Current Facility-Administered Medications  Medication Dose Route Frequency Provider Last Rate Last Admin   acetaminophen (TYLENOL) tablet 650 mg  650 mg Oral Q6H PRN [name], DO   650 mg at 02/20/23 2115   alum & mag hydroxide-simeth (MAALOX/MYLANTA) 200-200-20 MG/5ML suspension 30 mL  30 mL Oral Q4H PRN [name], DO   30 mL at 02/17/23 2211   aspirin EC tablet 81 mg  81 mg Oral Daily [name], MD   81 mg at 02/21/23 0805   atorvastatin (LIPITOR) tablet 20 mg  20 mg Oral Daily  [name], MD   20 mg at 02/21/23 0805   diphenhydrAMINE (BENADRYL) capsule 50 mg  50 mg Oral Q6H PRN [name], DO   50 mg at 09/29/22 2254   Or   diphenhydrAMINE (BENADRYL) injection 50 mg  50 mg Intramuscular Q6H PRN [name], DO       glimepiride (AMARYL) tablet 8 mg  8 mg Oral Q breakfast [name], MD   8 mg at 02/21/23 4098   haloperidol (HALDOL) tablet 5 mg  5 mg Oral Q6H PRN [name], DO   5 mg at 09/28/22 2149   Or   haloperidol lactate (HALDOL) injection 5 mg  5 mg Intramuscular Q6H PRN [name], DO       ibuprofen (ADVIL) tablet 600 mg  600 mg Oral Q6H PRN [name], [name], MD   600 mg at 02/21/23 0805   linagliptin (TRADJENTA) tablet 5 mg  5 mg Oral Daily [name], MD   5 mg at 02/21/23 0805   LORazepam (ATIVAN) tablet 1 mg  1 mg Oral TID PRN [name], DO   1 mg at 01/23/23 2133   magnesium hydroxide (MILK OF MAGNESIA) suspension 30 mL  30 mL Oral Daily PRN [name], DO   30 mL at 02/20/23 2119   metFORMIN (GLUCOPHAGE) tablet 1,000 mg  1,000 mg Oral [id number] [name], DO   1,000 mg at 02/21/23 1642   oxybutynin (DITROPAN) tablet 5 mg  5 mg Oral BID [name], MD   5 mg at 02/21/23 0806   paliperidone (INVEGA SUSTENNA) injection 156 mg  156 mg Intramuscular Q28 days [name], MD   156 mg at 02/12/23 1010   topiramate (TOPAMAX) tablet 25 mg  25 mg Oral BH-q8a4p [name], DO   25 mg at 02/21/23 1642   traZODone (DESYREL) tablet 50 mg  50 mg Oral QHS PRN [name], DO   50 mg at 02/20/23 2114   zonisamide (ZONEGRAN) capsule 100 mg  100 mg Oral QHS [name], DO   100 mg at 02/20/23 2115    Lab Results:  Results for orders placed or performed during the hospital encounter of 08/22/22 (from the past 48 hour(s))  Glucose, capillary     Status: Abnormal   Collection Time: 02/20/23  7:23 AM  Result Value Ref Range    Glucose-Capillary 139 (H) 70 - 99 mg/dL    Comment: Glucose reference range applies only to samples taken after fasting for at least 8 hours.     Blood Alcohol level:  Lab Results  Component Value Date  ETH <10 08/17/2022   ETH <10 01/11/2021      Musculoskeletal: Strength & Muscle Tone: within normal limits Gait & Station: normal Patient leans: N/A   Psychiatric Specialty Exam:   Presentation  General Appearance:  Casual; Neat   Eye Contact: Fair   Speech: Spontaneous   Speech Volume: Normal   Handedness: Right     Mood and Affect  Mood: "Fine"   Affect: Stable and pleasant    Thought Processes: Improved, at baseline   Descriptions of Associations: Intact   Orientation: Well-oriented   Thought Content: Improved, at baseline  Hallucinations:Denies, occasionally heard talking to self in her room  Ideas of Reference:None noted   Suicidal Thoughts:Denies SI, no intention or plan  Homicidal Thoughts:Denies HI, no intention or plan   Sensorium  Memory: Immediate Fair; Remote Poor   Judgment: Fair, pt is Rx compliant   Insight: Improved     Executive Functions  Concentration: Improved   Attention Span: Improved   Language: Fair     Psychomotor Activity  Psychomotor Activity: Normal  Assets  Assets: Communication Skills     Sleep  Sleep:Improved     Physical Exam: Physical Exam Vitals and nursing note reviewed.  Constitutional:      Appearance: Normal appearance. She is normal weight.  Neurological:     General: No focal deficit present.     Mental Status: She is alert.      Review of Systems  Constitutional: Negative.   HENT: Negative.    Eyes: Negative.   Respiratory: Negative.    Cardiovascular: Negative.   Gastrointestinal: Negative.   Genitourinary: Negative.   Musculoskeletal: Negative.   Skin: Negative.   Neurological: Negative.   Endo/Heme/Allergies: Negative.     Blood pressure (!) 98/59,  pulse 94, temperature 97.8 F (36.6 C), resp. rate 18, height 5\' 6"  (1.676 m), weight 86 kg, SpO2 100%. Body mass index is 30.59 kg/m.   Treatment Plan Summary: Daily contact with patient to assess and evaluate symptoms and progress in treatment, Medication management, and Plan continue current medications.   Continue to monitor patient on as needed meds   Patient received  dose of Invega Sustenna 156 mg IM on  01/15/23 Oxybutynin 5 mg by mouth twice daily to help with urinary incontinence Continue on Metformin and Amaryl.   Continue on atorvastatin for Mixed hyperlipidemia Topamax and Zonegran started 02/17/2023   Patient is awaiting placement    [name], MD

## 2023-02-22 DIAGNOSIS — F250 Schizoaffective disorder, bipolar type: Secondary | ICD-10-CM | POA: Diagnosis not present

## 2023-02-22 MED ORDER — ONDANSETRON HCL 4 MG PO TABS
4.0000 mg | ORAL_TABLET | ORAL | Status: DC | PRN
  Administered 2023-02-22 – 2023-02-27 (×3): 4 mg via ORAL
  Filled 2023-02-22 (×3): qty 1

## 2023-02-22 NOTE — BH IP Treatment Plan (Signed)
Interdisciplinary Treatment and Diagnostic Plan Update  02/22/2023 Time of Session: 10:53am Syan Clack MRN: [id number]  Principal Diagnosis: Schizoaffective disorder, bipolar type [facility])  Secondary Diagnoses: Principal Problem:   Schizoaffective disorder, bipolar type (HCC)   Current Medications:  Current Facility-Administered Medications  Medication Dose Route Frequency Provider Last Rate Last Admin   acetaminophen (TYLENOL) tablet 650 mg  650 mg Oral Q6H PRN [name], DO   650 mg at 02/20/23 2115   alum & mag hydroxide-simeth (MAALOX/MYLANTA) 200-200-20 MG/5ML suspension 30 mL  30 mL Oral Q4H PRN [name], DO   30 mL at 02/17/23 2211   aspirin EC tablet 81 mg  81 mg Oral Daily [name], MD   81 mg at 02/22/23 1062   atorvastatin (LIPITOR) tablet 20 mg  20 mg Oral Daily [name], MD   20 mg at 02/22/23 6948   diphenhydrAMINE (BENADRYL) capsule 50 mg  50 mg Oral Q6H PRN [name], DO   50 mg at 09/29/22 2254   Or   diphenhydrAMINE (BENADRYL) injection 50 mg  50 mg Intramuscular Q6H PRN [name], DO       glimepiride (AMARYL) tablet 8 mg  8 mg Oral Q breakfast [name], MD   8 mg at 02/22/23 0805   haloperidol (HALDOL) tablet 5 mg  5 mg Oral Q6H PRN [name], DO   5 mg at 09/28/22 2149   Or   haloperidol lactate (HALDOL) injection 5 mg  5 mg Intramuscular Q6H PRN [name], DO       ibuprofen (ADVIL) tablet 600 mg  600 mg Oral Q6H PRN [name], [name], MD   600 mg at 02/21/23 2123   linagliptin (TRADJENTA) tablet 5 mg  5 mg Oral Daily [name], MD   5 mg at 02/22/23 5462   LORazepam (ATIVAN) tablet 1 mg  1 mg Oral TID PRN [name], DO   1 mg at 01/23/23 2133   magnesium hydroxide (MILK OF MAGNESIA) suspension 30 mL  30 mL Oral Daily PRN [name], DO   30 mL at 02/20/23 2119   metFORMIN (GLUCOPHAGE) tablet 1,000 mg  1,000 mg Oral BH-q8a4p [name], DO   1,000 mg at 02/22/23 0805   ondansetron (ZOFRAN) tablet 4 mg  4 mg Oral Q4H PRN [name], MD       oxybutynin (DITROPAN) tablet 5 mg  5 mg Oral BID [name], MD   5 mg at 02/22/23 0937   paliperidone (INVEGA SUSTENNA) injection 156 mg  156 mg Intramuscular Q28 days [name], MD   156 mg at 02/12/23 1010   topiramate (TOPAMAX) tablet 25 mg  25 mg Oral BH-q8a4p [name], DO   25 mg at 02/22/23 0805   traZODone (DESYREL) tablet 50 mg  50 mg Oral QHS PRN [name], DO   50 mg at 02/21/23 2124   zonisamide (ZONEGRAN) capsule 100 mg  100 mg Oral QHS [name], DO   100 mg at 02/21/23 2123   PTA Medications: Medications Prior to Admission  Medication Sig Dispense Refill Last Dose   ondansetron (ZOFRAN) 4 MG tablet Take 1 tablet (4 mg total) by mouth every 8 (eight) hours as needed for nausea or vomiting. (Patient not taking: Reported on 08/17/2022) 20 tablet 0     Patient Stressors: Financial difficulties   Health problems   Medication change or noncompliance  Patient Strengths: Ability for insight   Treatment Modalities: Medication Management, Group therapy, Case management,  1 to 1 session with clinician, Psychoeducation, Recreational therapy.   Physician Treatment Plan for Primary Diagnosis: Schizoaffective disorder, bipolar type (HCC) Long Term Goal(s): Improvement in symptoms so as ready for discharge   Short Term Goals: Ability to identify changes in lifestyle to reduce recurrence of condition will improve Ability to verbalize feelings will improve Ability to disclose and discuss suicidal ideas Ability to demonstrate self-control will improve Ability to identify and develop effective coping behaviors will improve Ability to maintain clinical measurements within normal limits will improve Compliance with prescribed medications will improve Ability to identify triggers associated with substance  abuse/mental health issues will improve  Medication Management: Evaluate patient's response, side effects, and tolerance of medication regimen.  Therapeutic Interventions: 1 to 1 sessions, Unit Group sessions and Medication administration.  Evaluation of Outcomes: Not Progressing  Physician Treatment Plan for Secondary Diagnosis: Principal Problem:   Schizoaffective disorder, bipolar type (HCC)  Long Term Goal(s): Improvement in symptoms so as ready for discharge   Short Term Goals: Ability to identify changes in lifestyle to reduce recurrence of condition will improve Ability to verbalize feelings will improve Ability to disclose and discuss suicidal ideas Ability to demonstrate self-control will improve Ability to identify and develop effective coping behaviors will improve Ability to maintain clinical measurements within normal limits will improve Compliance with prescribed medications will improve Ability to identify triggers associated with substance abuse/mental health issues will improve     Medication Management: Evaluate patient's response, side effects, and tolerance of medication regimen.  Therapeutic Interventions: 1 to 1 sessions, Unit Group sessions and Medication administration.  Evaluation of Outcomes: Not Progressing   RN Treatment Plan for Primary Diagnosis: Schizoaffective disorder, bipolar type (HCC) Long Term Goal(s): Knowledge of disease and therapeutic regimen to maintain health will improve  Short Term Goals: Ability to remain free from injury will improve, Ability to verbalize frustration and anger appropriately will improve, Ability to demonstrate self-control, Ability to participate in decision making will improve, Ability to verbalize feelings will improve, Ability to disclose and discuss suicidal ideas, Ability to identify and develop effective coping behaviors will improve, and Compliance with prescribed medications will improve  Medication Management:  RN will administer medications as ordered by provider, will assess and evaluate patient's response and provide education to patient for prescribed medication. RN will report any adverse and/or side effects to prescribing provider.  Therapeutic Interventions: 1 on 1 counseling sessions, Psychoeducation, Medication administration, Evaluate responses to treatment, Monitor vital signs and CBGs as ordered, Perform/monitor CIWA, COWS, AIMS and Fall Risk screenings as ordered, Perform wound care treatments as ordered.  Evaluation of Outcomes: Not Progressing   LCSW Treatment Plan for Primary Diagnosis: Schizoaffective disorder, bipolar type (HCC) Long Term Goal(s): Safe transition to appropriate next level of care at discharge, Engage patient in therapeutic group addressing interpersonal concerns.  Short Term Goals: Engage patient in aftercare planning with referrals and resources, Increase social support, Increase ability to appropriately verbalize feelings, Increase emotional regulation, Facilitate acceptance of mental health diagnosis and concerns, Facilitate patient progression through stages of change regarding substance use diagnoses and concerns, Identify triggers associated with mental health/substance abuse issues, and Increase skills for wellness and recovery  Therapeutic Interventions: Assess for all discharge needs, 1 to 1 time with Social worker, Explore available resources and support systems, Assess for adequacy in community support network, Educate family and significant other(s) on suicide prevention, Complete Psychosocial Assessment,  Interpersonal group therapy.  Evaluation of Outcomes: Not Progressing   Progress in Treatment: Attending groups: Yes. Participating in groups: Yes. Taking medication as prescribed: Yes. Toleration medication: Yes. Family/Significant other contact made: Yes, individual(s) contacted:  PT son: Neylan Stong, [phone]. Patient understands diagnosis:  No. Discussing patient identified problems/goals with staff: Yes. Medical problems stabilized or resolved: No. Denies suicidal/homicidal ideation: Yes. Issues/concerns per patient self-inventory: No. Other:   New problem(s) identified: No, Describe:  none Updates 12/12/22: No changes made at this time Updates 12/17/22: No changes made at this time Update 12/24/22 No changes at this time. Update 12/29/2022: No changes at this time. Update 01/03/23: No changes at this time Update 01/08/23: No changes at this time Update 01/13/23: No changes at this time Update 01/18/23: None at this time. Update 01/23/2023:  No changes at this time. Update 01/28/23: Pt recently diagnosed with Diabetes Type II  Update 02/02/23: None at this time. Update 02/07/23: No changes at this time Update 02/12/23: No changes at this time Update 02/17/23: No changes at this time. Update: 02/22/2023: No changes at this time.   New Short Term/Long Term Goal(s): Update 6/30: none at this time. Update 09/27/2022:  No changes at this time.  Update 10/02/2022:  No changes at this time. Update 10/07/22: No changes at this time 10/12/22: No changes at this time Update 10/18/22: No changes at this time Update 10/23/22: No changes at this time Update 10/28/22: No changes at this time Update 11/07/22: No changes at this time Update 11/12/22: No changes at this time  Update 11/17/22: None at this time. Update 11/22/22: None at this time. Update 11/27/22 No changes at this time. Update 12/02/22 No changes at this time  Update 12/07/22: None at this time. Updates 12/12/22: No changes made at this time Updates 12/17/22: No changes made at this time Update 12/24/22 No changes at this time. Update 12/29/2022: No changes at this time. Update 01/03/23: No changes at this time Update 01/08/23: No changes at this time Update 01/13/23: No changes at this time   Update 01/18/23: No changes at this time. Update 01/23/2023:  No changes at this time. Update 01/28/2023:  No changes at this  time. Update 02/02/23: None at this time.  Update 02/07/23: No changes at this time Update 02/12/23: No changes at this time Update 02/17/23: No changes at this time. Update: 02/22/2023: No changes at this time.       Patient Goals:  Update 6/30: none at this time. Update 09/27/2022:  No changes at this time. Update 10/02/2022:  No changes at this time. Update 10/07/22: No changes at this time 10/12/22: No changes at this time Update 10/18/22: No changes at this time Update 10/23/22: No changes at this time Update 10/28/22: No changes at this time Update 11/07/22: No changes at this time Update 11/12/22: No changes at this time Update 11/17/22: None at this time. Update 11/22/22: None at this time. Update 11/27/22 No changes at this time Update 12/02/22 No changes at this time   Update 12/07/22: None at this time. Updates 12/12/22: No changes made at this time Updates 12/17/22: No changes made at this time Update 12/24/22 No changes at this time. Update 12/29/2022: No changes at this time. Update 01/03/23: No changes at this time Update 01/08/23: No changes at this time Update 01/13/23: No changes at this time   Update 01/18/23: No changes at this time. Update 01/23/2023:  No changes at this time.  Update 01/28/2023:  No changes at  this time. Update 02/02/23: None at this time.  Update 02/07/23: No changes at this time Update 02/12/23: No changes at this time Update 02/17/23: No changes at this time. Update: 02/22/2023: No changes at this time.       Discharge Plan or Barriers: Update 6/30: APS report has been made and patient is being investigated for guardianship needs.  Remains homeless with limited supports. Update 09/27/2022:  No changes at this time.  Update 10/02/2022:  Patient remains safe on the unit at this time.  Patient remains psychotic at this time.  APS report has been made, however, no follow up from the caseworker on her case.  No safe discharge identified.  CSW has requested that application for Medicaid and  disability be completed.   Update 10/07/22: No changes at this time 10/12/22: No changes at this time Update 10/18/22: No changes at this time Update 10/23/22: No changes at this time Update 10/23/22: No changes at this time Update 10/28/22: According to [facility], pt's caseworker at DSS, the petition was filed for guardianship on 10/21/22, now awaiting for the petition to be approvedUpdate 11/07/22: No changes at this time Update 11/12/22: CSW sent over patients information to [facility] and [facility], awaiting a response. CSW awaiting word from DSS regarding pt's petition and what agency will become her guardian. Update 11/17/22: No changes at this time. Update 11/22/22: CSW has sent patients information to multiple facilities that were suggested by leadership, pt has been denied from the facility or the facility does not respond. CSW continues to send pt's information to nursing homes. Update 11/27/22 CSW contacted DSS to inquire about the status of guardianship. No response at this time Update 12/02/22 Supervisor Loraine Leriche contacted DSS who stated they are not taking pt on for guardianship but that Quincy Carnes will be present tomorrow to do a visit with social worker and pt and that pt's son will be signing documentation for placement for the pt. Update 12/07/22: [facility] DSS has declined to take guardianship. Son, Casimiro Needle is to step into a more present role in deciding placement. A conversation is still needed. Updates 12/12/22: CSW staffed case with leadership and they state that a family meeting must happen with pt's son. CSW contacted pt's financial navigator and her DSS worker so that they may be present on the call and are able to speak to the efforts they have made to secure pt funding and housing. CSW waiting on responses from both, leadership is aware. Updates 12/17/22: CSW awaiting updates from both [facility] DSS and [facility] financial navigators as to status of pt's applications for  Medicaid/[employer] and Disability, respectively. Update 12/24/22 CSW sent email regarding family meeting per request of supervisors. CSW awaiting responses from all parties to schedule family meeting. Update: 12/29/2022 No changes at this time. Update 01/03/23: CSW continues to await responses on the scheduling of the family meeting. CSW received call from Primitivo Gauze at [employer], CSW sent FL2 to [place] to aide in pt's discharge planning. Update 01/08/23: CSW has reached out again to leadership and financial counseling. CSW continues to await a response. CSW called Sharlee Blew, awaiting a response for status of pt's placement options. Update 01/13/23: Pt is to meet with psychologist tomorrow 10/22 to complete capacity testing so that DSS can retry for guardianship  Update 01/18/23: The patient is still awaiting placement.  Update 01/23/2023:  Patient remains safe on the unit at this time.  CSW team continues to work on placement.  CSW  team in contact with DSS to develop plan on guardianship.   Work continues to Hospital doctor. Update 01/28/2023:  Medicaid application remains pending. DSS awaiting records from medical records to retry fo guardianship.  Update 02/02/23: None at this time.  Update 02/07/23: Sharlee Blew at DSS confirms that pt records were received and that they are petitioning for guardianship, she informs CSW that decision should be made on court date by Monday or Tuesday of next week. CSW still awaits status update on Medicaid application Update 02/12/23: Medicaid Application still pending. TOC Supervisors recommend that CSW continue searching for placement as we await DSS taking guardianship. Update 02/17/23: DSS confirms guardianship date for January 14th, 2025. CSW continue to search for placement. Update: 02/22/2023: No changes at this time.      Reason for Continuation of Hospitalization: Hallucinations Mania Medication stabilization   Estimated Length of Stay:  Update  6/30: 1-7 days Update 09/27/2022:  TBD Update 10/02/2022:  No changes at this time. UpdaUpdate 10/18/22: No changes at this time te 10/07/22: No changes at this time Update 10/18/22: No changes at this time Update 10/23/22: No changes at this time Update 10/28/22: No changes at this time Update 11/07/22: No changes at this time Update 11/12/22: No changes at this time  Update 11/17/22: No changes at this time. Update 11/22/22: None at this time. Update 11/27/22 No changes at this time. Update 12/02/22 No changes at this time  Update 12/07/22: None at this time. Updates 12/12/22: No changes made at this time Updates 12/17/22: TBD. Update 12/29/2022: No changes at this time. Update 01/03/23: TBDUpdate 01/08/23: TBD Update 01/13/23: TBD  Update 01/18/23: No changes at this time.  Update 01/23/2023:  TBD Update 01/28/2023:  TBD Update 02/02/23: None at this time. Update 02/07/23: TBD Update 02/12/23: TBD Update 02/17/23: TBD. Update: 02/22/2023: TBD.   Last 3 [place] Suicide Severity Risk Score: Flowsheet Row Admission (Current) from 08/22/2022 in [facility] ED from 08/17/2022 in [facility] Emergency Department at [facility] ED from 08/12/2022 in [facility] Emergency Department at [facility]  C-SSRS RISK CATEGORY Low Risk No Risk No Risk       Last [facility] 2/9 Scores:     No data to display          Scribe for Treatment Team: Rhett Bannister 02/22/2023 10:53 AM

## 2023-02-22 NOTE — Progress Notes (Signed)
Patient admitted IVC on Aug 22, 2022 for worsening psychosis. She is now a voluntary admssion.   Patient denies SI/HI/AVH. She denies anxiety and depression.  At approx 1100, patient noted to have vomited on her bedroom floor. VSS. Patient said that she felt fine. Zofran 4mg  PRN ordered but not given at this time. Zofran adm prior to dinner and Metformin given after dinner in an effort to minimize stomach irritation.  Q15 minute unit checks in place.

## 2023-02-22 NOTE — Group Note (Signed)
Date:  02/22/2023 Time:  8:08 PM  Group Topic/Focus:  Developing a Wellness Toolbox:   The focus of this group is to help patients develop a "wellness toolbox" with skills and strategies to promote recovery upon discharge.    Participation Level:  Did Not Attend  Participation Quality:   Did Not Attend  Affect:   Did Not Attend  Cognitive:   Did Not Attend  Insight: None  Engagement in Group:  None  Modes of Intervention:  Support  Additional Comments:    [name] 02/22/2023, 8:08 PM

## 2023-02-22 NOTE — Plan of Care (Signed)
  Problem: Education: Goal: Knowledge of General Education information will improve Description: Including pain rating scale, medication(s)/side effects and non-pharmacologic comfort measures Outcome: Progressing   Problem: Health Behavior/Discharge Planning: Goal: Ability to manage health-related needs will improve Outcome: Progressing   Problem: Clinical Measurements: Goal: Ability to maintain clinical measurements within normal limits will improve Outcome: Progressing Goal: Diagnostic test results will improve Outcome: Progressing   Problem: Nutrition: Goal: Adequate nutrition will be maintained Outcome: Progressing   Problem: Coping: Goal: Level of anxiety will decrease Outcome: Progressing   Problem: Elimination: Goal: Will not experience complications related to bowel motility Outcome: Progressing Goal: Will not experience complications related to urinary retention Outcome: Progressing   Problem: Pain Managment: Goal: General experience of comfort will improve Outcome: Progressing   Problem: Safety: Goal: Ability to remain free from injury will improve Outcome: Progressing   Problem: Skin Integrity: Goal: Risk for impaired skin integrity will decrease Outcome: Progressing

## 2023-02-22 NOTE — Group Note (Signed)
Date:  02/22/2023 Time:  2:42 PM  Group Topic/Focus:  Building Self Esteem:   The Focus of this group is helping patients become aware of the effects of self-esteem on their lives, the things they and others do that enhance or undermine their self-esteem, seeing the relationship between their level of self-esteem and the choices they make and learning ways to enhance self-esteem. Overcoming Stress:   The focus of this group is to define stress and help patients assess their triggers.    Participation Level:  Active  Participation Quality:  Appropriate  Affect:  Appropriate  Cognitive:  Alert, Appropriate, and Oriented  Insight: Appropriate, Good, and Improving  Engagement in Group:  Engaged  Modes of Intervention:  Activity  Additional Comments:    Alexis Frock 02/22/2023, 2:42 PM

## 2023-02-22 NOTE — Progress Notes (Signed)
[facility] MD Progress Note  Betty Henderson  MRN:  [id number]  Subjective: Case discussed in multidisciplinary meeting today, chart reviewed, patient seen today during rounds.  Social worker informed that patient has been assigned a guardian via court, the final/official  court hearing is on Jan 14th, 2025  Staff  reported that the patient had an episode of vomiting in the morning.  Patient was prescribed Zofran as needed.  During assessment patient reported that she is doing better.  Patient reported she probably threw up because of something she ate.  Patient was encouraged to eat diabetic/low carbohydrate diet.   No new acute issues overnight reported by the staff except N/V   Patient denies auditory or visual hallucinations.    Principal Problem: Schizoaffective disorder, bipolar type (HCC) Diagnosis: Principal Problem:   Schizoaffective disorder, bipolar type (HCC)   Past Psychiatric History: Schizoaffective disorder, bipolar type.  Past Medical History:  Past Medical History:  Diagnosis Date   Anemia    Arthritis    Chronic pain    Drug-seeking behavior    Malingering    Osteopetrosis    Psychosis (HCC)    Schizoaffective disorder, bipolar type (HCC)     Past Surgical History:  Procedure Laterality Date   MOUTH SURGERY     TUBAL LIGATION     Family History:  Family History  Family history unknown: Yes   Family Psychiatric  History: Unremarkable Social History:  Social History   Substance and Sexual Activity  Alcohol Use Not Currently   Comment: 1 cocktail 3 weeks ago     Social History   Substance and Sexual Activity  Drug Use Not Currently   Types: Cocaine   Comment: states "it's legal though"    Social History   Socioeconomic History   Marital status: Single    Spouse name: Not on file   Number of children: Not on file   Years of education: Not on file   Highest education level: Not on file  Occupational History   Not on file  Tobacco Use   Smoking  status: Every Day    Current packs/day: 0.50    Types: Cigarettes   Smokeless tobacco: Never  Vaping Use   Vaping status: Never Used  Substance and Sexual Activity   Alcohol use: Not Currently    Comment: 1 cocktail 3 weeks ago   Drug use: Not Currently    Types: Cocaine    Comment: states "it's legal though"   Sexual activity: Never  Other Topics Concern   Not on file  Social History Narrative   Not on file   Social Determinants of Health   Financial Resource Strain: Not on file  Food Insecurity: Food Insecurity Present (08/22/2022)   Hunger Vital Sign    Worried About Running Out of Food in the Last Year: Sometimes true    Ran Out of Food in the Last Year: Sometimes true  Transportation Needs: Patient Unable To Answer (08/22/2022)   PRAPARE - Transportation    Lack of Transportation (Medical): Patient unable to answer    Lack of Transportation (Non-Medical): Patient unable to answer  Recent Concern: Transportation Needs - Unmet Transportation Needs (06/13/2022)   Received from [facility], [facility]   [facility] - Transportation    Lack of Transportation (Medical): Not on file    Lack of Transportation (Non-Medical): Yes  Physical Activity: Not on file  Stress: Not on file  Social Connections: Unknown (08/06/2021)   Received from [facility], [facility]   Social Network    Social Network: Not on file   Additional Social History:     Patient is homeless at this time.  Social worker is working with DSS on [employer] application and  placement.                    Sleep Good  Appetite:  Good  Current Medications: Current Facility-Administered Medications  Medication Dose Route Frequency Provider Last Rate Last Admin   acetaminophen (TYLENOL) tablet 650 mg  650 mg Oral Q6H PRN [name], DO   650 mg at 02/20/23 2115   alum & mag hydroxide-simeth (MAALOX/MYLANTA) 200-200-20 MG/5ML suspension 30 mL  30 mL Oral Q4H PRN [name],  DO   30 mL at 02/17/23 2211   aspirin EC tablet 81 mg  81 mg Oral Daily [name], MD   81 mg at 02/22/23 1610   atorvastatin (LIPITOR) tablet 20 mg  20 mg Oral Daily [name], MD   20 mg at 02/22/23 9604   diphenhydrAMINE (BENADRYL) capsule 50 mg  50 mg Oral Q6H PRN [name], DO   50 mg at 09/29/22 2254   Or   diphenhydrAMINE (BENADRYL) injection 50 mg  50 mg Intramuscular Q6H PRN [name], DO       glimepiride (AMARYL) tablet 8 mg  8 mg Oral Q breakfast [name], MD   8 mg at 02/22/23 0805   haloperidol (HALDOL) tablet 5 mg  5 mg Oral Q6H PRN [name], DO   5 mg at 09/28/22 2149   Or   haloperidol lactate (HALDOL) injection 5 mg  5 mg Intramuscular Q6H PRN [name], DO       ibuprofen (ADVIL) tablet 600 mg  600 mg Oral Q6H PRN [name], [name], MD   600 mg at 02/21/23 2123   linagliptin (TRADJENTA) tablet 5 mg  5 mg Oral Daily [name], MD   5 mg at 02/22/23 5409   LORazepam (ATIVAN) tablet 1 mg  1 mg Oral TID PRN [name], DO   1 mg at 01/23/23 2133   magnesium hydroxide (MILK OF MAGNESIA) suspension 30 mL  30 mL Oral Daily PRN [name], DO   30 mL at 02/20/23 2119   metFORMIN (GLUCOPHAGE) tablet 1,000 mg  1,000 mg Oral BH-q8a4p [name], DO   1,000 mg at 02/22/23 1717   ondansetron (ZOFRAN) tablet 4 mg  4 mg Oral Q4H PRN [name], MD   4 mg at 02/22/23 1559   oxybutynin (DITROPAN) tablet 5 mg  5 mg Oral BID [name], MD   5 mg at 02/22/23 0937   paliperidone (INVEGA SUSTENNA) injection 156 mg  156 mg Intramuscular Q28 days [name], MD   156 mg at 02/12/23 1010   topiramate (TOPAMAX) tablet 25 mg  25 mg Oral BH-q8a4p [name], DO   25 mg at 02/22/23 1717   traZODone (DESYREL) tablet 50 mg  50 mg Oral QHS PRN [name], DO   50 mg at 02/21/23 2124   zonisamide (ZONEGRAN) capsule 100 mg  100 mg Oral QHS [name], DO   100 mg at 02/21/23 2123    Lab Results:  No results found for this or any previous visit (from the past 48 hour(s)).    Blood Alcohol level:  Lab Results  Component Value Date   ETH <  10 08/17/2022   ETH <10 01/11/2021      Musculoskeletal: Strength & Muscle Tone: within normal limits Gait & Station: normal Patient leans: N/A   Psychiatric Specialty Exam:   Presentation  General Appearance:  Casual; Neat   Eye Contact: Fair   Speech: Spontaneous   Speech Volume: Normal   Handedness: Right     Mood and Affect  Mood: "Fine"   Affect: Stable and pleasant    Thought Processes: Improved, at baseline   Descriptions of Associations: Intact   Orientation: Well-oriented   Thought Content: Improved, at baseline  Hallucinations:Denies, occasionally heard talking to self in her room  Ideas of Reference:None noted   Suicidal Thoughts:Denies SI, no intention or plan  Homicidal Thoughts:Denies HI, no intention or plan   Sensorium  Memory: Immediate Fair; Remote Poor   Judgment: Fair, pt is Rx compliant   Insight: Improved     Executive Functions  Concentration: Improved   Attention Span: Improved   Language: Fair     Psychomotor Activity  Psychomotor Activity: Normal  Assets  Assets: Communication Skills     Sleep  Sleep:Improved     Physical Exam: Physical Exam Vitals and nursing note reviewed.  Constitutional:      Appearance: Normal appearance. She is normal weight.  Neurological:     General: No focal deficit present.     Mental Status: She is alert.      Review of Systems  Constitutional: Negative.   HENT: Negative.    Eyes: Negative.   Respiratory: Negative.    Cardiovascular: Negative.   Gastrointestinal: Negative.   Genitourinary: Negative.   Musculoskeletal: Negative.   Skin: Negative.   Neurological: Negative.   Endo/Heme/Allergies: Negative.     Blood pressure 119/82, pulse (!)  103, temperature 98.1 F (36.7 C), resp. rate 18, height 5\' 6"  (1.676 m), weight 86 kg, SpO2 95%. Body mass index is 30.59 kg/m.   Treatment Plan Summary: Daily contact with patient to assess and evaluate symptoms and progress in treatment, Medication management, and Plan continue current medications.   Continue to monitor patient on as needed meds   Patient received  dose of Invega Sustenna 156 mg IM on  01/15/23 Oxybutynin 5 mg by mouth twice daily to help with urinary incontinence Continue on Metformin and Amaryl.   Continue on atorvastatin for Mixed hyperlipidemia Topamax and Zonegran started 02/17/2023   Patient is awaiting placement    [name], MD

## 2023-02-22 NOTE — Plan of Care (Signed)
  Problem: Coping: Goal: Level of anxiety will decrease Outcome: Progressing   Problem: Elimination: Goal: Will not experience complications related to bowel motility Outcome: Progressing  Pt isolative to her room this shift refused to come to the dayroom stating that she had vomited earlier during the day she said she was just feeling tired. Patient provided encouragement. Medications given per Provider orders. Patient ambulating on front wheel walker in her room. All fall protocol in place.

## 2023-02-22 NOTE — Plan of Care (Signed)
  Problem: Clinical Measurements: Goal: Ability to maintain clinical measurements within normal limits will improve Outcome: Not Progressing   Problem: Nutrition: Goal: Adequate nutrition will be maintained Outcome: Not Progressing   Problem: Coping: Goal: Level of anxiety will decrease Outcome: Progressing

## 2023-02-23 DIAGNOSIS — F250 Schizoaffective disorder, bipolar type: Secondary | ICD-10-CM | POA: Diagnosis not present

## 2023-02-23 NOTE — Progress Notes (Signed)
[facility] MD Progress Note  Betty Henderson  MRN:  [id number]  Subjective: Case discussed in multidisciplinary meeting today, chart reviewed, patient seen today during rounds.  Social worker informed that patient has been assigned a guardian via court, the final/official  court hearing is on Jan 14th, 2025  Today during assessment patient reported that she is doing better.  Patient was seen sitting in day area with peers.  She attended AM group today.  She denies nausea vomiting today.  No new acute issues overnight. Patient denies auditory or visual hallucinations.    Principal Problem: Schizoaffective disorder, bipolar type (HCC) Diagnosis: Principal Problem:   Schizoaffective disorder, bipolar type (HCC)   Past Psychiatric History: Schizoaffective disorder, bipolar type.  Past Medical History:  Past Medical History:  Diagnosis Date   Anemia    Arthritis    Chronic pain    Drug-seeking behavior    Malingering    Osteopetrosis    Psychosis (HCC)    Schizoaffective disorder, bipolar type (HCC)     Past Surgical History:  Procedure Laterality Date   MOUTH SURGERY     TUBAL LIGATION     Family History:  Family History  Family history unknown: Yes   Family Psychiatric  History: Unremarkable Social History:  Social History   Substance and Sexual Activity  Alcohol Use Not Currently   Comment: 1 cocktail 3 weeks ago     Social History   Substance and Sexual Activity  Drug Use Not Currently   Types: Cocaine   Comment: states "it's legal though"    Social History   Socioeconomic History   Marital status: Single    Spouse name: Not on file   Number of children: Not on file   Years of education: Not on file   Highest education level: Not on file  Occupational History   Not on file  Tobacco Use   Smoking status: Every Day    Current packs/day: 0.50    Types: Cigarettes   Smokeless tobacco: Never  Vaping Use   Vaping status: Never Used  Substance and Sexual  Activity   Alcohol use: Not Currently    Comment: 1 cocktail 3 weeks ago   Drug use: Not Currently    Types: Cocaine    Comment: states "it's legal though"   Sexual activity: Never  Other Topics Concern   Not on file  Social History Narrative   Not on file   Social Determinants of Health   Financial Resource Strain: Not on file  Food Insecurity: Food Insecurity Present (08/22/2022)   Hunger Vital Sign    Worried About Running Out of Food in the Last Year: Sometimes true    Ran Out of Food in the Last Year: Sometimes true  Transportation Needs: Patient Unable To Answer (08/22/2022)   PRAPARE - Transportation    Lack of Transportation (Medical): Patient unable to answer    Lack of Transportation (Non-Medical): Patient unable to answer  Recent Concern: Transportation Needs - Unmet Transportation Needs (06/13/2022)   Received from [facility], [facility]   [facility] - Transportation    Lack of Transportation (Medical): Not on file    Lack of Transportation (Non-Medical): Yes  Physical Activity: Not on file  Stress: Not on file  Social Connections: Unknown (08/06/2021)   Received from [facility], [facility]   Social Network    Social Network: Not on file   Additional Social History:     Patient is homeless at this time.  Social worker is working with DSS on [employer] application and  placement.                    Sleep Good  Appetite:  Good  Current Medications: Current Facility-Administered Medications  Medication Dose Route Frequency Provider Last Rate Last Admin   acetaminophen (TYLENOL) tablet 650 mg  650 mg Oral Q6H PRN [name], DO   650 mg at 02/20/23 2115   alum & mag hydroxide-simeth (MAALOX/MYLANTA) 200-200-20 MG/5ML suspension 30 mL  30 mL Oral Q4H PRN [name], DO   30 mL at 02/17/23 2211   aspirin EC tablet 81 mg  81 mg Oral Daily [name], MD   81 mg at 02/23/23 0930   atorvastatin (LIPITOR) tablet 20 mg  20 mg  Oral Daily [name], MD   20 mg at 02/23/23 0930   diphenhydrAMINE (BENADRYL) capsule 50 mg  50 mg Oral Q6H PRN [name], DO   50 mg at 09/29/22 2254   Or   diphenhydrAMINE (BENADRYL) injection 50 mg  50 mg Intramuscular Q6H PRN [name], DO       glimepiride (AMARYL) tablet 8 mg  8 mg Oral Q breakfast [name], MD   8 mg at 02/23/23 1610   haloperidol (HALDOL) tablet 5 mg  5 mg Oral Q6H PRN [name], DO   5 mg at 09/28/22 2149   Or   haloperidol lactate (HALDOL) injection 5 mg  5 mg Intramuscular Q6H PRN [name], DO       ibuprofen (ADVIL) tablet 600 mg  600 mg Oral Q6H PRN [name], [name], MD   600 mg at 02/21/23 2123   linagliptin (TRADJENTA) tablet 5 mg  5 mg Oral Daily [name], MD   5 mg at 02/23/23 0930   LORazepam (ATIVAN) tablet 1 mg  1 mg Oral TID PRN [name], DO   1 mg at 01/23/23 2133   magnesium hydroxide (MILK OF MAGNESIA) suspension 30 mL  30 mL Oral Daily PRN [name], DO   30 mL at 02/20/23 2119   metFORMIN (GLUCOPHAGE) tablet 1,000 mg  1,000 mg Oral BH-q8a4p [name], DO   1,000 mg at 02/23/23 0929   ondansetron (ZOFRAN) tablet 4 mg  4 mg Oral Q4H PRN [name], MD   4 mg at 02/22/23 1559   oxybutynin (DITROPAN) tablet 5 mg  5 mg Oral BID [name], MD   5 mg at 02/23/23 0931   paliperidone (INVEGA SUSTENNA) injection 156 mg  156 mg Intramuscular Q28 days [name], MD   156 mg at 02/12/23 1010   topiramate (TOPAMAX) tablet 25 mg  25 mg Oral BH-q8a4p [name], DO   25 mg at 02/23/23 0930   traZODone (DESYREL) tablet 50 mg  50 mg Oral QHS PRN [name], DO   50 mg at 02/22/23 2120   zonisamide (ZONEGRAN) capsule 100 mg  100 mg Oral QHS [name], DO   100 mg at 02/22/23 2119    Lab Results:  No results found for this or any previous visit (from the past 48 hour(s)).    Blood Alcohol  level:  Lab Results  Component Value Date   [facility] <10 08/17/2022   ETH <10 01/11/2021      Musculoskeletal: Strength & Muscle Tone: within normal limits Gait & Station: normal Patient leans: N/A  Psychiatric Specialty Exam:   Presentation  General Appearance:  Casual; Neat   Eye Contact: Fair   Speech: Spontaneous   Speech Volume: Normal   Handedness: Right     Mood and Affect  Mood: "Fine"   Affect: Stable and pleasant    Thought Processes: Improved, at baseline   Descriptions of Associations: Intact   Orientation: Well-oriented   Thought Content: Improved, at baseline  Hallucinations:Denies  Ideas of Reference:None noted   Suicidal Thoughts:Denies SI, no intention or plan  Homicidal Thoughts:Denies HI, no intention or plan   Sensorium  Memory: Immediate Fair; Remote Poor   Judgment: Fair, pt is Rx compliant   Insight: Improved     Executive Functions  Concentration: Improved   Attention Span: Improved   Language: Fair     Psychomotor Activity  Psychomotor Activity: Normal  Assets  Assets: Communication Skills     Sleep  Sleep:Improved     Physical Exam: Physical Exam Vitals and nursing note reviewed.  Constitutional:      Appearance: Normal appearance. She is normal weight.  Neurological:     General: No focal deficit present.     Mental Status: She is alert.      Review of Systems  Constitutional: Negative.   HENT: Negative.    Eyes: Negative.   Respiratory: Negative.    Cardiovascular: Negative.   Gastrointestinal: Negative.   Genitourinary: Negative.   Musculoskeletal: Negative.   Skin: Negative.   Neurological: Negative.   Endo/Heme/Allergies: Negative.     Blood pressure 110/84, pulse (!) 102, temperature 97.9 F (36.6 C), resp. rate 17, height 5\' 6"  (1.676 m), weight 86 kg, SpO2 92%. Body mass index is 30.59 kg/m.   Treatment Plan Summary: Daily contact with patient to assess and evaluate  symptoms and progress in treatment, Medication management, and Plan continue current medications.   Continue to monitor patient on as needed meds   Patient received  dose of Invega Sustenna 156 mg IM on  01/15/23 Oxybutynin 5 mg by mouth twice daily to help with urinary incontinence Continue on Metformin and Amaryl.   Continue on atorvastatin for Mixed hyperlipidemia Topamax and Zonegran started 02/17/2023   Patient is awaiting placement    [name], MD

## 2023-02-23 NOTE — Plan of Care (Signed)
  Problem: Education: Goal: Knowledge of General Education information will improve Description: Including pain rating scale, medication(s)/side effects and non-pharmacologic comfort measures Outcome: Progressing   Problem: Health Behavior/Discharge Planning: Goal: Ability to manage health-related needs will improve Outcome: Progressing   Problem: Clinical Measurements: Goal: Ability to maintain clinical measurements within normal limits will improve Outcome: Progressing Goal: Diagnostic test results will improve Outcome: Progressing   Problem: Nutrition: Goal: Adequate nutrition will be maintained Outcome: Progressing   Problem: Coping: Goal: Level of anxiety will decrease Outcome: Progressing   Problem: Elimination: Goal: Will not experience complications related to bowel motility Outcome: Progressing Goal: Will not experience complications related to urinary retention Outcome: Progressing   Problem: Pain Managment: Goal: General experience of comfort will improve Outcome: Progressing   Problem: Safety: Goal: Ability to remain free from injury will improve Outcome: Progressing   Problem: Skin Integrity: Goal: Risk for impaired skin integrity will decrease Outcome: Progressing

## 2023-02-23 NOTE — Group Note (Signed)
Date:  02/23/2023 Time:  3:03 PM  Group Topic/Focus:  Healthy Communication:   The focus of this group is to discuss communication, barriers to communication, as well as healthy ways to communicate with others. Movie therapy encourages emotional release Even those who often have trouble expressing their emotions might find themselves laughing or crying during a film. This release of emotions can have a cathartic effect and also make it easier for a person to become more comfortable in expressing their emotions.    Participation Level:  Active  Participation Quality:  Appropriate  Affect:  Appropriate  Cognitive:  Appropriate  Insight: Appropriate  Engagement in Group:  Developing/Improving  Modes of Intervention:  Activity  Additional Comments:    Betty Henderson 02/23/2023, 3:03 PM

## 2023-02-23 NOTE — Progress Notes (Signed)
   02/23/23 2000  Psych Admission Type (Psych Patients Only)  Admission Status Voluntary  Psychosocial Assessment  Patient Complaints None  Eye Contact Fair  Facial Expression Flat  Affect Sad  Speech Slurred  Interaction Assertive  Motor Activity Shuffling  Appearance/Hygiene Unremarkable  Behavior Characteristics Calm  Mood Sad  Thought Process  Coherency WDL  Content WDL  Delusions None reported or observed  Perception WDL  Hallucination None reported or observed  Judgment Impaired  Confusion None  Danger to Self  Current suicidal ideation? Denies  Agreement Not to Harm Self Yes  Description of Agreement verbal  Danger to Others  Danger to Others None reported or observed  Danger to Others Abnormal  Harmful Behavior to others No threats or harm toward other people  Destructive Behavior No threats or harm toward property

## 2023-02-23 NOTE — Group Note (Signed)
Date:  02/23/2023 Time:  10:29 AM  Group Topic/Focus:  Goals Group:   The focus of this group is to help patients establish daily goals to achieve during treatment and discuss how the patient can incorporate goal setting into their daily lives to aide in recovery. Managing Feelings:   The focus of this group is to identify what feelings patients have difficulty handling and develop a plan to handle them in a healthier way upon discharge. Self Care:   The focus of this group is to help patients understand the importance of self-care in order to improve or restore emotional, physical, spiritual, interpersonal, and financial health.    Participation Level:  Active  Participation Quality:  Appropriate and Attentive  Affect:  Appropriate  Cognitive:  Alert and Appropriate  Insight: Appropriate  Engagement in Group:  Developing/Improving, Engaged, and Improving  Modes of Intervention:  Activity, Discussion, and Education  Additional Comments:    [name] 02/23/2023, 10:29 AM

## 2023-02-24 DIAGNOSIS — F250 Schizoaffective disorder, bipolar type: Secondary | ICD-10-CM | POA: Diagnosis not present

## 2023-02-24 NOTE — Progress Notes (Signed)
Patient is a voluntary admission to [facility] for SAD bipolar since 08/22/22. Awaiting placement.  Denies SI, HI, AVH, anxiety and depression.  Likes to isolate in her room but comes out for group activities, meals and snack.  Pleasant and cooperative with staff and peers.  Ambulates with walker.  Will continue to monitor.

## 2023-02-24 NOTE — Plan of Care (Signed)

## 2023-02-24 NOTE — Group Note (Signed)
Date:  02/24/2023 Time:  8:34 PM  Group Topic/Focus:  Dimensions of Wellness:   The focus of this group is to introduce the topic of wellness and discuss the role each dimension of wellness plays in total health. Self Care:   The focus of this group is to help patients understand the importance of self-care in order to improve or restore emotional, physical, spiritual, interpersonal, and financial health.    Participation Level:  Did Not Attend   Additional Comments:    Betty Henderson 02/24/2023, 8:34 PM

## 2023-02-24 NOTE — Group Note (Signed)
Date:  02/24/2023 Time:  6:40 AM  Group Topic/Focus:  Personal Choices and Values:   The focus of this group is to help patients assess and explore the importance of values in their lives, how their values affect their decisions, how they express their values and what opposes their expression.    Participation Level:  Did Not Attend  Participation Quality:      Affect:      Cognitive:      Insight: None  Engagement in Group:  None  Modes of Intervention:      Additional Comments:    [name] 02/24/2023, 6:40 AM

## 2023-02-24 NOTE — Progress Notes (Signed)
[facility] MD Progress Note  02/24/2023 12:22 PM [name]  MRN:  [id number] Subjective: Betty Henderson is seen on rounds.  Social worker informs me that she is having a hearing on January 14 for custody./Guardian.  She has been compliant with medications.  Last week I started her on Zonegran and Topamax to decrease her appetite since she has gained weight because she has been on the unit for so long.  She been compliant with medications.  No other side effects.  She has been pleasant and cooperative.  No suicidal ideation. Principal Problem: Schizoaffective disorder, bipolar type (HCC) Diagnosis: Principal Problem:   Schizoaffective disorder, bipolar type (HCC)  Total Time spent with patient: 15 minutes  Past Psychiatric History: History of schizoaffective disorder, polysubstance abuse and noncompliance.  Past Medical History:  Past Medical History:  Diagnosis Date   Anemia    Arthritis    Chronic pain    Drug-seeking behavior    Malingering    Osteopetrosis    Psychosis (HCC)    Schizoaffective disorder, bipolar type (HCC)     Past Surgical History:  Procedure Laterality Date   MOUTH SURGERY     TUBAL LIGATION     Family History:  Family History  Family history unknown: Yes   Family Psychiatric  History: Unremarkable Social History:  Social History   Substance and Sexual Activity  Alcohol Use Not Currently   Comment: 1 cocktail 3 weeks ago     Social History   Substance and Sexual Activity  Drug Use Not Currently   Types: Cocaine   Comment: states "it's legal though"    Social History   Socioeconomic History   Marital status: Single    Spouse name: Not on file   Number of children: Not on file   Years of education: Not on file   Highest education level: Not on file  Occupational History   Not on file  Tobacco Use   Smoking status: Every Day    Current packs/day: 0.50    Types: Cigarettes   Smokeless tobacco: Never  Vaping Use   Vaping status: Never Used   Substance and Sexual Activity   Alcohol use: Not Currently    Comment: 1 cocktail 3 weeks ago   Drug use: Not Currently    Types: Cocaine    Comment: states "it's legal though"   Sexual activity: Never  Other Topics Concern   Not on file  Social History Narrative   Not on file   Social Determinants of Health   Financial Resource Strain: Not on file  Food Insecurity: Food Insecurity Present (08/22/2022)   Hunger Vital Sign    Worried About Running Out of Food in the Last Year: Sometimes true    Ran Out of Food in the Last Year: Sometimes true  Transportation Needs: Patient Unable To Answer (08/22/2022)   PRAPARE - Transportation    Lack of Transportation (Medical): Patient unable to answer    Lack of Transportation (Non-Medical): Patient unable to answer  Recent Concern: Transportation Needs - Unmet Transportation Needs (06/13/2022)   Received from [facility], [facility]   [facility] - Transportation    Lack of Transportation (Medical): Not on file    Lack of Transportation (Non-Medical): Yes  Physical Activity: Not on file  Stress: Not on file  Social Connections: Unknown (08/06/2021)   Received from [facility], [facility]   Social Network    Social Network: Not on file   Additional Social History:  Sleep: Good  Appetite:  Good  Current Medications: Current Facility-Administered Medications  Medication Dose Route Frequency Provider Last Rate Last Admin   acetaminophen (TYLENOL) tablet 650 mg  650 mg Oral Q6H PRN [name], DO   650 mg at 02/20/23 2115   alum & mag hydroxide-simeth (MAALOX/MYLANTA) 200-200-20 MG/5ML suspension 30 mL  30 mL Oral Q4H PRN [name], DO   30 mL at 02/17/23 2211   aspirin EC tablet 81 mg  81 mg Oral Daily [name], MD   81 mg at 02/24/23 2130   atorvastatin (LIPITOR) tablet 20 mg  20 mg Oral Daily [name], MD   20 mg at 02/24/23 8657   diphenhydrAMINE (BENADRYL)  capsule 50 mg  50 mg Oral Q6H PRN [name], DO   50 mg at 09/29/22 2254   Or   diphenhydrAMINE (BENADRYL) injection 50 mg  50 mg Intramuscular Q6H PRN [name], DO       glimepiride (AMARYL) tablet 8 mg  8 mg Oral Q breakfast [name], MD   8 mg at 02/24/23 8469   haloperidol (HALDOL) tablet 5 mg  5 mg Oral Q6H PRN [name], DO   5 mg at 09/28/22 2149   Or   haloperidol lactate (HALDOL) injection 5 mg  5 mg Intramuscular Q6H PRN [name], DO       ibuprofen (ADVIL) tablet 600 mg  600 mg Oral Q6H PRN [name], [name], MD   600 mg at 02/21/23 2123   linagliptin (TRADJENTA) tablet 5 mg  5 mg Oral Daily [name], MD   5 mg at 02/24/23 6295   LORazepam (ATIVAN) tablet 1 mg  1 mg Oral TID PRN [name], DO   1 mg at 02/23/23 2122   magnesium hydroxide (MILK OF MAGNESIA) suspension 30 mL  30 mL Oral Daily PRN [name], DO   30 mL at 02/20/23 2119   metFORMIN (GLUCOPHAGE) tablet 1,000 mg  1,000 mg Oral BH-q8a4p [name], DO   1,000 mg at 02/24/23 0923   ondansetron (ZOFRAN) tablet 4 mg  4 mg Oral Q4H PRN [name], MD   4 mg at 02/22/23 1559   oxybutynin (DITROPAN) tablet 5 mg  5 mg Oral BID [name], MD   5 mg at 02/24/23 0923   paliperidone (INVEGA SUSTENNA) injection 156 mg  156 mg Intramuscular Q28 days [name], MD   156 mg at 02/12/23 1010   topiramate (TOPAMAX) tablet 25 mg  25 mg Oral BH-q8a4p [name], DO   25 mg at 02/24/23 2841   traZODone (DESYREL) tablet 50 mg  50 mg Oral QHS PRN [name], DO   50 mg at 02/23/23 2122   zonisamide (ZONEGRAN) capsule 100 mg  100 mg Oral QHS [name], DO   100 mg at 02/23/23 2122    Lab Results: No results found for this or any previous visit (from the past 48 hour(s)).  Blood Alcohol level:  Lab Results  Component Value Date   ETH <10 08/17/2022   ETH <10 01/11/2021     Metabolic Disorder Labs: Lab Results  Component Value Date   HGBA1C 11.0 (H) 01/27/2023   MPG 269 01/27/2023   No results found for: "PROLACTIN" Lab Results  Component Value Date   CHOL 259 (H) 01/27/2023   TRIG 155 (H) 01/27/2023   HDL 65 01/27/2023   CHOLHDL  4.0 01/27/2023   VLDL 31 01/27/2023   LDLCALC 163 (H) 01/27/2023    Physical Findings: AIMS:  , ,  ,  ,    CIWA:    COWS:     Musculoskeletal: Strength & Muscle Tone: within normal limits Gait & Station: normal Patient leans: N/A  Psychiatric Specialty Exam:  Presentation  General Appearance:  Casual; Neat  Eye Contact: Fair  Speech: Slow; Slurred  Speech Volume: Decreased  Handedness: Right   Mood and Affect  Mood: Anxious  Affect: Inappropriate   Thought Process  Thought Processes: Disorganized  Descriptions of Associations:Loose  Orientation:Partial  Thought Content:Illogical; Rumination  History of Schizophrenia/Schizoaffective disorder:No data recorded Duration of Psychotic Symptoms:No data recorded Hallucinations:No data recorded Ideas of Reference:Paranoia  Suicidal Thoughts:No data recorded Homicidal Thoughts:No data recorded  Sensorium  Memory: Immediate Fair; Remote Poor  Judgment: Poor  Insight: Poor   Executive Functions  Concentration: Poor  Attention Span: Fair  Recall: [employer] of Knowledge: Fair  Language: Fair   Psychomotor Activity  Psychomotor Activity:No data recorded  Assets  Assets: Communication Skills   Sleep  Sleep:No data recorded    Blood pressure (!) 144/67, pulse 84, temperature 98.3 F (36.8 C), resp. rate 20, height 5\' 6"  (1.676 m), weight 86 kg, SpO2 97%. Body mass index is 30.59 kg/m.   Treatment Plan Summary: Daily contact with patient to assess and evaluate symptoms and progress in treatment, Medication management, and Plan continue current medications.  [name], DO 02/24/2023, 12:22  PM

## 2023-02-24 NOTE — Group Note (Signed)
Recreation Therapy Group Note   Group Topic:Coping Skills  Group Date: 02/24/2023 Start Time: 1400 End Time: 1450 Facilitators: Rosina Lowenstein, LRT, CTRS Location:  Dayroom  Group Description: Mind Map.  Patient was provided a blank template of a diagram with 32 blank boxes in a tiered system, branching from the center (similar to a bubble chart). LRT directed patients to label the middle of the diagram "Coping Skills". LRT and patients then came up with 8 different coping skills as examples. Pt were directed to record their coping skills in the 2nd tier boxes closest to the center.  Patients would then share their coping skills with the group as LRT wrote them out. LRT gave a handout of 99 different coping skills at the end of group.   Goal Area(s) Addressed: Patients will be able to define "coping skills". Patient will identify new coping skills.  Patient will increase communication.   Affect/Mood: N/A   Participation Level: Did not attend    Clinical Observations/Individualized Feedback: Betty Henderson did not attend group.   Plan: Continue to engage patient in RT group sessions 2-3x/week.   Rosina Lowenstein, LRT, CTRS 02/24/2023 2:54 PM

## 2023-02-25 DIAGNOSIS — F250 Schizoaffective disorder, bipolar type: Secondary | ICD-10-CM | POA: Diagnosis not present

## 2023-02-25 NOTE — Group Note (Signed)
Date:  02/25/2023 Time:  10:12 PM  Group Topic/Focus:  Goals Group:   The focus of this group is to help patients establish daily goals to achieve during treatment and discuss how the patient can incorporate goal setting into their daily lives to aide in recovery.    Participation Level:  Did Not Attend  Participation Quality:      Affect:      Cognitive:      Insight: None  Engagement in Group:      Modes of Intervention:      Additional Comments:    [name] 02/25/2023, 10:12 PM

## 2023-02-25 NOTE — Progress Notes (Signed)
   02/25/23 2100  Psych Admission Type (Psych Patients Only)  Admission Status Voluntary  Psychosocial Assessment  Patient Complaints None  Eye Contact Fair  Facial Expression Flat  Affect Sad  Speech Slurred  Interaction Assertive  Motor Activity Shuffling  Appearance/Hygiene Unremarkable  Behavior Characteristics Cooperative  Mood Sad  Thought Process  Coherency WDL  Content WDL  Delusions None reported or observed  Perception WDL  Hallucination None reported or observed  Judgment Impaired  Confusion None  Danger to Self  Current suicidal ideation? Denies  Agreement Not to Harm Self Yes  Description of Agreement verbal  Danger to Others  Danger to Others None reported or observed  Danger to Others Abnormal  Harmful Behavior to others No threats or harm toward other people  Destructive Behavior No threats or harm toward property

## 2023-02-25 NOTE — Progress Notes (Signed)
[facility] MD Progress Note  02/25/2023 1:23 PM Betty Henderson  MRN:  [id number] Subjective: Betty Henderson is seen on rounds.  Social work has no new issues.  She has been compliant with her medications.  No side effects except for weight gain.  She is on Topamax and Zonegran to help offset that side effect.  She is sleeping well.  She is well controlled with her behavior.  She is pleasant and cooperative.   Principal Problem: Schizoaffective disorder, bipolar type (HCC) Diagnosis: Principal Problem:   Schizoaffective disorder, bipolar type (HCC)  Total Time spent with patient: 15 minutes  Past Psychiatric History: Schizoaffective disorder  Past Medical History:  Past Medical History:  Diagnosis Date   Anemia    Arthritis    Chronic pain    Drug-seeking behavior    Malingering    Osteopetrosis    Psychosis (HCC)    Schizoaffective disorder, bipolar type (HCC)     Past Surgical History:  Procedure Laterality Date   MOUTH SURGERY     TUBAL LIGATION     Family History:  Family History  Family history unknown: Yes   Family Psychiatric  History: Unremarkable Social History:  Social History   Substance and Sexual Activity  Alcohol Use Not Currently   Comment: 1 cocktail 3 weeks ago     Social History   Substance and Sexual Activity  Drug Use Not Currently   Types: Cocaine   Comment: states "it's legal though"    Social History   Socioeconomic History   Marital status: Single    Spouse name: Not on file   Number of children: Not on file   Years of education: Not on file   Highest education level: Not on file  Occupational History   Not on file  Tobacco Use   Smoking status: Every Day    Current packs/day: 0.50    Types: Cigarettes   Smokeless tobacco: Never  Vaping Use   Vaping status: Never Used  Substance and Sexual Activity   Alcohol use: Not Currently    Comment: 1 cocktail 3 weeks ago   Drug use: Not Currently    Types: Cocaine    Comment: states "it's legal  though"   Sexual activity: Never  Other Topics Concern   Not on file  Social History Narrative   Not on file   Social Determinants of Health   Financial Resource Strain: Not on file  Food Insecurity: Food Insecurity Present (08/22/2022)   Hunger Vital Sign    Worried About Running Out of Food in the Last Year: Sometimes true    Ran Out of Food in the Last Year: Sometimes true  Transportation Needs: Patient Unable To Answer (08/22/2022)   PRAPARE - Transportation    Lack of Transportation (Medical): Patient unable to answer    Lack of Transportation (Non-Medical): Patient unable to answer  Recent Concern: Transportation Needs - Unmet Transportation Needs (06/13/2022)   Received from [facility]   [facility] - Transportation    Lack of Transportation (Medical): Not on file    Lack of Transportation (Non-Medical): Yes  Physical Activity: Not on file  Stress: Not on file  Social Connections: Unknown (08/06/2021)   Received from [facility], [facility]   Social Network    Social Network: Not on file   Additional Social History:                         Sleep: Good  Appetite:  Good  Current Medications: Current Facility-Administered Medications  Medication Dose Route Frequency Provider Last Rate Last Admin   acetaminophen (TYLENOL) tablet 650 mg  650 mg Oral Q6H PRN [name], DO   650 mg at 02/20/23 2115   alum & mag hydroxide-simeth (MAALOX/MYLANTA) 200-200-20 MG/5ML suspension 30 mL  30 mL Oral Q4H PRN [name], DO   30 mL at 02/17/23 2211   aspirin EC tablet 81 mg  81 mg Oral Daily [name], MD   81 mg at 02/25/23 0919   atorvastatin (LIPITOR) tablet 20 mg  20 mg Oral Daily [name], MD   20 mg at 02/25/23 0919   diphenhydrAMINE (BENADRYL) capsule 50 mg  50 mg Oral Q6H PRN [name], DO   50 mg at 09/29/22 2254   Or   diphenhydrAMINE (BENADRYL) injection 50 mg  50 mg Intramuscular Q6H PRN [name], DO       glimepiride (AMARYL) tablet 8 mg  8 mg Oral Q breakfast [name], MD   8 mg at 02/25/23 6063   haloperidol (HALDOL) tablet 5 mg  5 mg Oral Q6H PRN [name], DO   5 mg at 09/28/22 2149   Or   haloperidol lactate (HALDOL) injection 5 mg  5 mg Intramuscular Q6H PRN [name], DO       ibuprofen (ADVIL) tablet 600 mg  600 mg Oral Q6H PRN [name], [name], MD   600 mg at 02/25/23 0160   linagliptin (TRADJENTA) tablet 5 mg  5 mg Oral Daily [name], MD   5 mg at 02/25/23 1093   LORazepam (ATIVAN) tablet 1 mg  1 mg Oral TID PRN [name], DO   1 mg at 02/24/23 2139   magnesium hydroxide (MILK OF MAGNESIA) suspension 30 mL  30 mL Oral Daily PRN [name], DO   30 mL at 02/20/23 2119   metFORMIN (GLUCOPHAGE) tablet 1,000 mg  1,000 mg Oral BH-q8a4p [name], DO   1,000 mg at 02/25/23 0918   ondansetron (ZOFRAN) tablet 4 mg  4 mg Oral Q4H PRN [name], MD   4 mg at 02/22/23 1559   oxybutynin (DITROPAN) tablet 5 mg  5 mg Oral BID [name], MD   5 mg at 02/25/23 0919   paliperidone (INVEGA SUSTENNA) injection 156 mg  156 mg Intramuscular Q28 days [name], MD   156 mg at 02/12/23 1010   topiramate (TOPAMAX) tablet 25 mg  25 mg Oral BH-q8a4p [name], DO   25 mg at 02/25/23 0919   traZODone (DESYREL) tablet 50 mg  50 mg Oral QHS PRN [name], DO   50 mg at 02/24/23 2139   zonisamide (ZONEGRAN) capsule 100 mg  100 mg Oral QHS [name], DO   100 mg at 02/24/23 2139    Lab Results: No results found for this or any previous visit (from the past 48 hour(s)).  Blood Alcohol level:  Lab Results  Component Value Date   ETH <10 08/17/2022   ETH <10 01/11/2021    Metabolic Disorder Labs: Lab Results  Component Value Date   HGBA1C 11.0 (H) 01/27/2023   MPG 269 01/27/2023   No results found for: "PROLACTIN" Lab Results  Component  Value Date   CHOL 259 (H) 01/27/2023   TRIG 155 (H) 01/27/2023   HDL 65 01/27/2023   CHOLHDL 4.0 01/27/2023  VLDL 31 01/27/2023   LDLCALC 163 (H) 01/27/2023    Physical Findings: AIMS:  , ,  ,  ,    CIWA:    COWS:     Musculoskeletal: Strength & Muscle Tone: within normal limits Gait & Station: normal Patient leans: N/A  Psychiatric Specialty Exam:  Presentation  General Appearance:  Casual; Neat  Eye Contact: Fair  Speech: Slow; Slurred  Speech Volume: Decreased  Handedness: Right   Mood and Affect  Mood: Anxious  Affect: Inappropriate   Thought Process  Thought Processes: Disorganized  Descriptions of Associations:Loose  Orientation:Partial  Thought Content:Illogical; Rumination  History of Schizophrenia/Schizoaffective disorder:No data recorded Duration of Psychotic Symptoms:No data recorded Hallucinations:No data recorded Ideas of Reference:Paranoia  Suicidal Thoughts:No data recorded Homicidal Thoughts:No data recorded  Sensorium  Memory: Immediate Fair; Remote Poor  Judgment: Poor  Insight: Poor   Executive Functions  Concentration: Poor  Attention Span: Fair  Recall: [employer] of Knowledge: Fair  Language: Fair   Psychomotor Activity  Psychomotor Activity:No data recorded  Assets  Assets: Communication Skills   Sleep  Sleep:No data recorded    Blood pressure 120/83, pulse (!) 103, temperature (!) 97.5 F (36.4 C), resp. rate 18, height 5\' 6"  (1.676 m), weight 86 kg, SpO2 98%. Body mass index is 30.59 kg/m.   Treatment Plan Summary: Daily contact with patient to assess and evaluate symptoms and progress in treatment, Medication management, and Plan continue current medications.  [name], DO 02/25/2023, 1:23 PM

## 2023-02-25 NOTE — Group Note (Signed)
Recreation Therapy Group Note   Group Topic:Self-Esteem  Group Date: 02/25/2023 Start Time: 1405 End Time: 1455 Facilitators: Rosina Lowenstein, LRT, CTRS Location:  Dayroom  Group Description: My strengths and Qualities. Patients and LRT discussed the importance of self-love/self-esteem and things that cause it to fluctuate, including our mental health or state. Pt completed a worksheet that helps them identify 24 different strengths and qualities about themselves. Pt encouraged to read aloud at least 3 off their sheet to the group. LRT and pts discussed how this can be applied to daily life post-discharge.  Pt's then played "Positive Affirmation Bingo" afterwards, with stress balls as prizes.     Goal Area(s) Addressed: Patient will identify positive qualities about themselves. Patient will learn new positive affirmations.  Patient will recite positive qualities and affirmations aloud to the group.  Patient will practice positive self-talk.  Patient will increase communication.   Affect/Mood: N/A   Participation Level: Did not attend    Clinical Observations/Individualized Feedback: Betty Henderson did not attend group.   Plan: Continue to engage patient in RT group sessions 2-3x/week.   Rosina Lowenstein, LRT, CTRS 02/25/2023 4:25 PM

## 2023-02-25 NOTE — Group Note (Signed)

## 2023-02-25 NOTE — Progress Notes (Signed)
Patient fell in bathroom. Patient was noted lying on her right side on her bathroom floor. Patient states she was attempting to get in the shower when she tripped over her walker. 2 staff members assisted patient to her bed she endorses left hip pain, PRN Ibuprofen given. Will continue to monitor for changes.

## 2023-02-25 NOTE — Plan of Care (Signed)

## 2023-02-25 NOTE — Group Note (Signed)
Date:  02/25/2023 Time:  10:56 AM  Group Topic/Focus:  Dimensions of Wellness:   The focus of this group is to introduce the topic of wellness and discuss the role each dimension of wellness plays in total health.    Participation Level:  Did Not Attend   Ardelle Anton 02/25/2023, 10:56 AM

## 2023-02-25 NOTE — Progress Notes (Signed)
   02/25/23 0902  Psych Admission Type (Psych Patients Only)  Admission Status Voluntary  Psychosocial Assessment  Patient Complaints None  Eye Contact Fair  Facial Expression Flat  Affect Sullen  Speech Slurred  Interaction Minimal  Motor Activity Shuffling  Appearance/Hygiene Unremarkable;Body odor  Behavior Characteristics Cooperative  Mood Sullen  Thought Process  Coherency WDL  Content WDL  Delusions None reported or observed  Perception WDL  Hallucination Auditory;Visual  Judgment Impaired  Confusion None  Danger to Self  Current suicidal ideation? Denies  Danger to Others  Danger to Others None reported or observed  Danger to Others Abnormal  Harmful Behavior to others No threats or harm toward other people

## 2023-02-26 DIAGNOSIS — F250 Schizoaffective disorder, bipolar type: Secondary | ICD-10-CM | POA: Diagnosis not present

## 2023-02-26 NOTE — Progress Notes (Signed)
1930--Pt c/o N/V. States she's having heartburn dt to her new medications.  Mod amount of undigested food found on the floor near pt's bed. Zofran 4mg  po given PRN for N/V.  No further complaints offered. No s/s of acute distress noted.   2130--Pt states she's feeling much better. Po bedtime medications given as scheduled.tol well. Denies SI/HI/AV/H. No c/o pain/discomfort noted. Plan of care continued.

## 2023-02-26 NOTE — Plan of Care (Signed)
  Problem: Education: Goal: Knowledge of General Education information will improve Description: Including pain rating scale, medication(s)/side effects and non-pharmacologic comfort measures Outcome: Progressing   Problem: Health Behavior/Discharge Planning: Goal: Ability to manage health-related needs will improve Outcome: Progressing   Problem: Clinical Measurements: Goal: Ability to maintain clinical measurements within normal limits will improve Outcome: Progressing Goal: Diagnostic test results will improve Outcome: Progressing   Problem: Nutrition: Goal: Adequate nutrition will be maintained Outcome: Progressing   Problem: Coping: Goal: Level of anxiety will decrease Outcome: Progressing   Problem: Elimination: Goal: Will not experience complications related to bowel motility Outcome: Progressing Goal: Will not experience complications related to urinary retention Outcome: Progressing   Problem: Pain Managment: Goal: General experience of comfort will improve Outcome: Progressing   Problem: Safety: Goal: Ability to remain free from injury will improve Outcome: Progressing   Problem: Skin Integrity: Goal: Risk for impaired skin integrity will decrease Outcome: Progressing

## 2023-02-26 NOTE — Progress Notes (Signed)
   02/26/23 0915  Psych Admission Type (Psych Patients Only)  Admission Status Voluntary  Psychosocial Assessment  Patient Complaints None  Eye Contact Fair  Facial Expression Flat  Affect Sullen  Speech Slurred  Interaction Minimal  Motor Activity Shuffling  Appearance/Hygiene Unremarkable;Body odor  Behavior Characteristics Calm  Mood Sullen  Thought Process  Coherency WDL  Content WDL  Delusions None reported or observed  Perception WDL  Hallucination Auditory;Visual  Judgment Impaired  Confusion None  Danger to Self  Current suicidal ideation? Denies  Danger to Others  Danger to Others None reported or observed  Danger to Others Abnormal  Harmful Behavior to others No threats or harm toward other people

## 2023-02-26 NOTE — Plan of Care (Signed)

## 2023-02-26 NOTE — Progress Notes (Signed)
[facility] MD Progress Note  02/26/2023 10:57 AM Betty Henderson  MRN:  [id number] Subjective: Betty Henderson is seen on rounds.  She has been compliant with medications.  She has been in good controls on the unit.  She is pleasant and cooperative.  Social work is working on placement.  She supposedly has a hearing on January 14. Principal Problem: Schizoaffective disorder, bipolar type (HCC) Diagnosis: Principal Problem:   Schizoaffective disorder, bipolar type (HCC)  Total Time spent with patient: 15 minutes  Past Psychiatric History: Polysubstance abuse and schizoaffective disorder  Past Medical History:  Past Medical History:  Diagnosis Date   Anemia    Arthritis    Chronic pain    Drug-seeking behavior    Malingering    Osteopetrosis    Psychosis (HCC)    Schizoaffective disorder, bipolar type (HCC)     Past Surgical History:  Procedure Laterality Date   MOUTH SURGERY     TUBAL LIGATION     Family History:  Family History  Family history unknown: Yes   Family Psychiatric  History: Unremarkable Social History:  Social History   Substance and Sexual Activity  Alcohol Use Not Currently   Comment: 1 cocktail 3 weeks ago     Social History   Substance and Sexual Activity  Drug Use Not Currently   Types: Cocaine   Comment: states "it's legal though"    Social History   Socioeconomic History   Marital status: Single    Spouse name: Not on file   Number of children: Not on file   Years of education: Not on file   Highest education level: Not on file  Occupational History   Not on file  Tobacco Use   Smoking status: Every Day    Current packs/day: 0.50    Types: Cigarettes   Smokeless tobacco: Never  Vaping Use   Vaping status: Never Used  Substance and Sexual Activity   Alcohol use: Not Currently    Comment: 1 cocktail 3 weeks ago   Drug use: Not Currently    Types: Cocaine    Comment: states "it's legal though"   Sexual activity: Never  Other Topics Concern    Not on file  Social History Narrative   Not on file   Social Determinants of Health   Financial Resource Strain: Not on file  Food Insecurity: Food Insecurity Present (08/22/2022)   Hunger Vital Sign    Worried About Running Out of Food in the Last Year: Sometimes true    Ran Out of Food in the Last Year: Sometimes true  Transportation Needs: Patient Unable To Answer (08/22/2022)   PRAPARE - Transportation    Lack of Transportation (Medical): Patient unable to answer    Lack of Transportation (Non-Medical): Patient unable to answer  Recent Concern: Transportation Needs - Unmet Transportation Needs (06/13/2022)   [facility]   [facility] - Transportation    Lack of Transportation (Medical): Not on file    Lack of Transportation (Non-Medical): Yes  Physical Activity: Not on file  Stress: Not on file  Social Connections: Unknown (08/06/2021)   Received from [facility], [facility]   Social Network    Social Network: Not on file   Additional Social History:                         Sleep: Good  Appetite:  Good  Current Medications: Current Facility-Administered Medications  Medication Dose Route Frequency  Provider Last Rate Last Admin   acetaminophen (TYLENOL) tablet 650 mg  650 mg Oral Q6H PRN [name], DO   650 mg at 02/26/23 0902   alum & mag hydroxide-simeth (MAALOX/MYLANTA) 200-200-20 MG/5ML suspension 30 mL  30 mL Oral Q4H PRN [name], DO   30 mL at 02/26/23 0981   aspirin EC tablet 81 mg  81 mg Oral Daily [name], MD   81 mg at 02/26/23 0902   atorvastatin (LIPITOR) tablet 20 mg  20 mg Oral Daily [name], MD   20 mg at 02/26/23 0902   diphenhydrAMINE (BENADRYL) capsule 50 mg  50 mg Oral Q6H PRN [name], DO   50 mg at 09/29/22 2254   Or   diphenhydrAMINE (BENADRYL) injection 50 mg  50 mg Intramuscular Q6H PRN [name], DO       glimepiride (AMARYL) tablet 8 mg   8 mg Oral Q breakfast [name], MD   8 mg at 02/26/23 0901   haloperidol (HALDOL) tablet 5 mg  5 mg Oral Q6H PRN [name], DO   5 mg at 09/28/22 2149   Or   haloperidol lactate (HALDOL) injection 5 mg  5 mg Intramuscular Q6H PRN [name], DO       ibuprofen (ADVIL) tablet 600 mg  600 mg Oral Q6H PRN [name], [name], MD   600 mg at 02/25/23 1914   linagliptin (TRADJENTA) tablet 5 mg  5 mg Oral Daily [name], MD   5 mg at 02/26/23 0902   LORazepam (ATIVAN) tablet 1 mg  1 mg Oral TID PRN [name], DO   1 mg at 02/24/23 2139   magnesium hydroxide (MILK OF MAGNESIA) suspension 30 mL  30 mL Oral Daily PRN [name], DO   30 mL at 02/20/23 2119   metFORMIN (GLUCOPHAGE) tablet 1,000 mg  1,000 mg Oral BH-q8a4p [name], DO   1,000 mg at 02/26/23 0901   ondansetron (ZOFRAN) tablet 4 mg  4 mg Oral Q4H PRN [name], MD   4 mg at 02/22/23 1559   oxybutynin (DITROPAN) tablet 5 mg  5 mg Oral BID [name], MD   5 mg at 02/26/23 0902   paliperidone (INVEGA SUSTENNA) injection 156 mg  156 mg Intramuscular Q28 days [name], MD   156 mg at 02/12/23 1010   topiramate (TOPAMAX) tablet 25 mg  25 mg Oral BH-q8a4p [name], DO   25 mg at 02/26/23 0902   traZODone (DESYREL) tablet 50 mg  50 mg Oral QHS PRN [name], DO   50 mg at 02/25/23 2130   zonisamide (ZONEGRAN) capsule 100 mg  100 mg Oral QHS [name], DO   100 mg at 02/25/23 2130    Lab Results: No results found for this or any previous visit (from the past 48 hour(s)).  Blood Alcohol level:  Lab Results  Component Value Date   ETH <10 08/17/2022   ETH <10 01/11/2021    Metabolic Disorder Labs: Lab Results  Component Value Date   HGBA1C 11.0 (H) 01/27/2023   MPG 269 01/27/2023   No results found for: "PROLACTIN" Lab Results  Component Value Date   CHOL 259 (H) 01/27/2023   TRIG 155 (H)  01/27/2023   HDL 65 01/27/2023   CHOLHDL 4.0 01/27/2023   VLDL 31 01/27/2023   LDLCALC 163 (H) 01/27/2023    Physical  Findings: AIMS:  , ,  ,  ,    CIWA:    COWS:     Musculoskeletal: Strength & Muscle Tone: within normal limits Gait & Station: normal Patient leans: N/A  Psychiatric Specialty Exam:  Presentation  General Appearance:  Casual; Neat  Eye Contact: Fair  Speech: Slow; Slurred  Speech Volume: Decreased  Handedness: Right   Mood and Affect  Mood: Anxious  Affect: Inappropriate   Thought Process  Thought Processes: Disorganized  Descriptions of Associations:Loose  Orientation:Partial  Thought Content:Illogical; Rumination  History of Schizophrenia/Schizoaffective disorder:No data recorded Duration of Psychotic Symptoms:No data recorded Hallucinations:No data recorded Ideas of Reference:Paranoia  Suicidal Thoughts:No data recorded Homicidal Thoughts:No data recorded  Sensorium  Memory: Immediate Fair; Remote Poor  Judgment: Poor  Insight: Poor   Executive Functions  Concentration: Poor  Attention Span: Fair  Recall: [employer] of Knowledge: Fair  Language: Fair   Psychomotor Activity  Psychomotor Activity:No data recorded  Assets  Assets: Communication Skills   Sleep  Sleep:No data recorded   Blood pressure 105/71, pulse 75, temperature 98.1 F (36.7 C), resp. rate 15, height 5\' 6"  (1.676 m), weight 86 kg, SpO2 96%. Body mass index is 30.59 kg/m.   Treatment Plan Summary: Daily contact with patient to assess and evaluate symptoms and progress in treatment, Medication management, and Plan continue current medication.  [name], DO 02/26/2023, 10:57 AM

## 2023-02-27 DIAGNOSIS — F250 Schizoaffective disorder, bipolar type: Secondary | ICD-10-CM | POA: Diagnosis not present

## 2023-02-27 MED ORDER — PANTOPRAZOLE SODIUM 40 MG PO TBEC
80.0000 mg | DELAYED_RELEASE_TABLET | Freq: Every day | ORAL | Status: DC
  Administered 2023-02-27 – 2023-03-28 (×30): 80 mg via ORAL
  Filled 2023-02-27 (×30): qty 2

## 2023-02-27 NOTE — Group Note (Signed)
Recreation Therapy Group Note   Group Topic:Relaxation  Group Date: 02/27/2023 Start Time: 1400 End Time: 1440 Facilitators: Rosina Lowenstein, LRT, CTRS Location:  Dayroom  Group Description: Meditation. LRT and patients discussed what they know about meditation and mindfulness. LRT played a Deep Breathing Meditation exercise script for patients to follow along to. LRT and patients discussed how meditation and deep breathing can be used as a coping skill post--discharge to help manage symptoms of stress. Afterwards, LRT gave pts the option to go outside for fresh air and sunlight, briefly.   Goal Area(s) Addressed: Patient will practice using relaxation technique. Patient will identify a new coping skill.  Patient will follow multistep directions to reduce anxiety and stress.   Affect/Mood: Appropriate   Participation Level: Moderate   Participation Quality: Independent   Behavior: Appropriate and Calm   Speech/Thought Process: Coherent   Insight: Fair   Judgement: Fair    Modes of Intervention: Education, Exploration, and Guided Discussion   Patient Response to Interventions:  Receptive   Education Outcome:  Acknowledges education   Clinical Observations/Individualized Feedback: Idessa was mostly active in their participation of session activities and group discussion. Pt completed most of the exercises shown. 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 Pt chose to go outside after relaxation session. Pt interacted well with LRT and peers duration of session.    Plan: Continue to engage patient in RT group sessions 2-3x/week.   Rosina Lowenstein, LRT, CTRS 02/27/2023 5:17 PM

## 2023-02-27 NOTE — BHH Counselor (Signed)
CSW sent information to [place] at [facility], [phone].  She confirms that she has ALF homes.   CSW sent H&P, FL2 and physician notes from this week.  CSW was informed that she would have an answer by end of week.  Penni Homans, MSW, LCSW 02/27/2023 11:31 AM

## 2023-02-27 NOTE — Group Note (Signed)
Date:  02/27/2023 Time:  1:31 PM  Group Topic/Focus:  Movement Therapy    Participation Level:  Did Not Attend    Rodena Goldmann 02/27/2023, 1:31 PM

## 2023-02-27 NOTE — Progress Notes (Signed)
[facility] MD Progress Note  02/27/2023 11:19 AM Betty Henderson  MRN:  [id number] Subjective: Betty Henderson is seen on rounds.  She has been having more heartburn.  I will start her on some Protonix.  Probably, from the Zonegran and Topamax but hopefully that will help with somewhat weight loss in the future.  She is not getting enough exercise.  She is pleasant and cooperative and in good controls.  No other side effects noted.  Nurses report no problems.  She does participate in groups and she is doing well.  Social work continues to look for placement. Principal Problem: Schizoaffective disorder, bipolar type (HCC) Diagnosis: Principal Problem:   Schizoaffective disorder, bipolar type (HCC)  Total Time spent with patient: 15 minutes  Past Psychiatric History: Schizoaffective disorder  Past Medical History:  Past Medical History:  Diagnosis Date   Anemia    Arthritis    Chronic pain    Drug-seeking behavior    Malingering    Osteopetrosis    Psychosis (HCC)    Schizoaffective disorder, bipolar type (HCC)     Past Surgical History:  Procedure Laterality Date   MOUTH SURGERY     TUBAL LIGATION     Family History:  Family History  Family history unknown: Yes   Family Psychiatric  History: Unremarkable Social History:  Social History   Substance and Sexual Activity  Alcohol Use Not Currently   Comment: 1 cocktail 3 weeks ago     Social History   Substance and Sexual Activity  Drug Use Not Currently   Types: Cocaine   Comment: states "it's legal though"    Social History   Socioeconomic History   Marital status: Single    Spouse name: Not on file   Number of children: Not on file   Years of education: Not on file   Highest education level: Not on file  Occupational History   Not on file  Tobacco Use   Smoking status: Every Day    Current packs/day: 0.50    Types: Cigarettes   Smokeless tobacco: Never  Vaping Use   Vaping status: Never Used  Substance and Sexual  Activity   Alcohol use: Not Currently    Comment: 1 cocktail 3 weeks ago   Drug use: Not Currently    Types: Cocaine    Comment: states "it's legal though"   Sexual activity: Never  Other Topics Concern   Not on file  Social History Narrative   Not on file   Social Determinants of Health   Financial Resource Strain: Not on file  Food Insecurity: Food Insecurity Present (08/22/2022)   Hunger Vital Sign    Worried About Running Out of Food in the Last Year: Sometimes true    Ran Out of Food in the Last Year: Sometimes true  Transportation Needs: Patient Unable To Answer (08/22/2022)   PRAPARE - Transportation    Lack of Transportation (Medical): Patient unable to answer    Lack of Transportation (Non-Medical): Patient unable to answer  Recent Concern: Transportation Needs - Unmet Transportation Needs (06/13/2022)   Received from [facility]   [facility] - Transportation    Lack of Transportation (Medical): Not on file    Lack of Transportation (Non-Medical): Yes  Physical Activity: Not on file  Stress: Not on file  Social Connections: Unknown (08/06/2021)   Received from [facility]   Social Network    Social Network: Not on file   Additional Social History:  Sleep: Good  Appetite:  Good  Current Medications: Current Facility-Administered Medications  Medication Dose Route Frequency Provider Last Rate Last Admin   acetaminophen (TYLENOL) tablet 650 mg  650 mg Oral Q6H PRN [name], DO   650 mg at 02/26/23 0902   alum & mag hydroxide-simeth (MAALOX/MYLANTA) 200-200-20 MG/5ML suspension 30 mL  30 mL Oral Q4H PRN [name], DO   30 mL at 02/26/23 1928   aspirin EC tablet 81 mg  81 mg Oral Daily [name], MD   81 mg at 02/27/23 0956   atorvastatin (LIPITOR) tablet 20 mg  20 mg Oral Daily [name], MD   20 mg at 02/27/23 0956   diphenhydrAMINE (BENADRYL) capsule 50 mg  50 mg  Oral Q6H PRN [name], DO   50 mg at 09/29/22 2254   Or   diphenhydrAMINE (BENADRYL) injection 50 mg  50 mg Intramuscular Q6H PRN [name], DO       glimepiride (AMARYL) tablet 8 mg  8 mg Oral Q breakfast [name], MD   8 mg at 02/27/23 1610   haloperidol (HALDOL) tablet 5 mg  5 mg Oral Q6H PRN [name], DO   5 mg at 09/28/22 2149   Or   haloperidol lactate (HALDOL) injection 5 mg  5 mg Intramuscular Q6H PRN [name], DO       ibuprofen (ADVIL) tablet 600 mg  600 mg Oral Q6H PRN [name], [name], MD   600 mg at 02/25/23 9604   linagliptin (TRADJENTA) tablet 5 mg  5 mg Oral Daily [name], MD   5 mg at 02/27/23 5409   LORazepam (ATIVAN) tablet 1 mg  1 mg Oral TID PRN [name], DO   1 mg at 02/24/23 2139   magnesium hydroxide (MILK OF MAGNESIA) suspension 30 mL  30 mL Oral Daily PRN [name], DO   30 mL at 02/20/23 2119   metFORMIN (GLUCOPHAGE) tablet 1,000 mg  1,000 mg Oral BH-q8a4p [name], DO   1,000 mg at 02/27/23 0955   ondansetron (ZOFRAN) tablet 4 mg  4 mg Oral Q4H PRN [name], MD   4 mg at 02/26/23 2007   oxybutynin (DITROPAN) tablet 5 mg  5 mg Oral BID [name], MD   5 mg at 02/27/23 0956   paliperidone (INVEGA SUSTENNA) injection 156 mg  156 mg Intramuscular Q28 days [name], MD   156 mg at 02/12/23 1010   pantoprazole (PROTONIX) EC tablet 80 mg  80 mg Oral Daily [name], DO   80 mg at 02/27/23 1004   topiramate (TOPAMAX) tablet 25 mg  25 mg Oral BH-q8a4p [name], DO   25 mg at 02/27/23 8119   traZODone (DESYREL) tablet 50 mg  50 mg Oral QHS PRN [name], DO   50 mg at 02/25/23 2130   zonisamide (ZONEGRAN) capsule 100 mg  100 mg Oral QHS [name], DO   100 mg at 02/26/23 2135    Lab Results: No results found for this or any previous visit (from the past 48 hour(s)).  Blood Alcohol  level:  Lab Results  Component Value Date   [facility] <10 08/17/2022   ETH <10 01/11/2021    Metabolic Disorder Labs: Lab Results  Component Value Date   HGBA1C 11.0 (H) 01/27/2023   MPG 269 01/27/2023   No results found for: "PROLACTIN" Lab Results  Component Value Date   CHOL 259 (H) 01/27/2023   TRIG 155 (H) 01/27/2023   HDL 65 01/27/2023   CHOLHDL 4.0 01/27/2023   VLDL 31 01/27/2023   LDLCALC 163 (H) 01/27/2023    Physical Findings: AIMS:  , ,  ,  ,    CIWA:    COWS:     Musculoskeletal: Strength & Muscle Tone: within normal limits Gait & Station: normal Patient leans: N/A  Psychiatric Specialty Exam:  Presentation  General Appearance:  Casual; Neat  Eye Contact: Fair  Speech: Slow; Slurred  Speech Volume: Decreased  Handedness: Right   Mood and Affect  Mood: Anxious  Affect: Inappropriate   Thought Process  Thought Processes: Disorganized  Descriptions of Associations:Loose  Orientation:Partial  Thought Content:Illogical; Rumination  History of Schizophrenia/Schizoaffective disorder:No data recorded Duration of Psychotic Symptoms:No data recorded Hallucinations:No data recorded Ideas of Reference:Paranoia  Suicidal Thoughts:No data recorded Homicidal Thoughts:No data recorded  Sensorium  Memory: Immediate Fair; Remote Poor  Judgment: Poor  Insight: Poor   Executive Functions  Concentration: Poor  Attention Span: Fair  Recall: [employer] of Knowledge: Fair  Language: Fair   Psychomotor Activity  Psychomotor Activity:No data recorded  Assets  Assets: Communication Skills   Sleep  Sleep:No data recorded    Blood pressure 96/67, pulse 73, temperature 97.7 F (36.5 C), resp. rate 15, height 5\' 6"  (1.676 m), weight 86 kg, SpO2 100%. Body mass index is 30.59 kg/m.   Treatment Plan Summary: Daily contact with patient to assess and evaluate symptoms and progress in treatment, Medication management, and  Plan continue current medication.  Start Protonix.  [name], DO 02/27/2023, 11:19 AM

## 2023-02-27 NOTE — BHH Counselor (Signed)
CSW received call from [facility]F.   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   Reynaldo Minium, MSW, [facility] 02/27/2023 11:54 AM

## 2023-02-27 NOTE — Plan of Care (Signed)
?  Problem: Nutrition: ?Goal: Adequate nutrition will be maintained ?Outcome: Progressing ?  ?Problem: Coping: ?Goal: Level of anxiety will decrease ?Outcome: Progressing ?  ?Problem: Elimination: ?Goal: Will not experience complications related to bowel motility ?Outcome: Progressing ?Goal: Will not experience complications related to urinary retention ?Outcome: Progressing ?  ?Problem: Pain Managment: ?Goal: General experience of comfort will improve ?Outcome: Progressing ?  ?Problem: Safety: ?Goal: Ability to remain free from injury will improve ?Outcome: Progressing ?  ?

## 2023-02-27 NOTE — BHH Counselor (Signed)
CSW spoke with [place] at [facility], [phone].    CSW was able to confirm bed availability.  Tonya requests that information be sent to [email or web address].  Information requested to be sent: 2 weeks of MAR, 2 weeks of nursing notes, FL2.  Archie Patten wanted additional information on funding.  CSW informed that Medicaid is pending and this Clinical research associate is unsure on status of disability.  Tonya requests that primary CSW give her a call back.  CSW will follow up.  Penni Homans, MSW, LCSW 02/27/2023 11:09 AM

## 2023-02-27 NOTE — BHH Counselor (Addendum)
ADDENDUM  [employer] DECLINED they are private pay only  Betty Henderson, MSW, LCSW 02/28/2023 1:23 PM   CSW followed up with ALF referrals  [place] [facility] does not accept Medicaid. [facility]--Necessary staff has left for the day.  CSW to follow up. [facility]--CSW left HIPAA compliant VM [facility] Ottie Glazier was asked to email information to [email or web address].  [facility] [place]--VM full, unable to leave message. [facility]--CSW left HIPAA compliant VM. [facility]--Fax number provided on the HUB no longer working, needs to be faxed to [phone] [facility] [place]--transferred to the admissions staff, voicemail box full, unable to leave HIPAA compliant VM. [facility]--No vacancies available at this time.  [facility]-- Asked to call the front desk at [phone].  No vacancies at this time. DECLINED. [facility]--Line is busy, CSW to attempt again later. [facility]--Requested that information be resent to the Attention to Achille Rich [phone] [facility] [facility]-- CSW left HIPAA compliant VM. [facility] [employer] access to the [employer] [email or web address] [facility]--No answer CSW will have to follow up.  [facility]--"Not a locked unit and would not accept her here.  You could try [facility] they have a locked unit." DECLINED [employer]-- CSW left HIPAA compliant VM. [facility]-- No access to [place], [facility] [place] [phone] [facility]--Asked that information be emailed to [email or web address].  [facility]-- CSW left HIPAA compliant VM.  CSW has sent the requested information.   Betty Henderson, MSW, LCSW 02/27/2023 4:05 PM

## 2023-02-27 NOTE — B[facility] Counselor (Signed)
CSW referred patient to the following in an effort to find placement:   Destination  Service Provider Request Status Address Phone Fax  [facility]  Pending - Request Sent [address] [place] [address] [phone]  [facility]ALF  Pending - Request Sent [address], [place] [address] [phone]  Ruthy Dick at [facility] AL[facility]  Pending - Request Sent [address] [place] [address] [phone]  [facility]  Pending - Request Sent [address] [place] [address] [phone]  [facility]  Pending - Request Sent [address] [place], [place] [address] [phone]  [facility]  Pending - Request Sent [address] [place] [address] [phone]  [facility]  Pending - Request Sent [address], [place] [address] [phone]  [facility]  Pending - Request Sent [address] [place] [address] [phone]  [facility]  Pending - Request Sent [address] [place] [address] [phone]  [facility]  Pending - Request Sent [address], [place] [address] [phone]  [facility] SN[facility]ALF  Pending - Request Sent [address] [place] [address] [phone]  [facility] [place] [facility]  Pending - Request Sent [address] [place] [address] [phone]  [facility]  Pending - Request Sent [address], [place] [address] [phone]  [facility]  Pending - Request Sent [address] [place] [address] [phone]  [facility]  Pending -  Request Sent [address], [place] [address] [phone]  [facility] [facility] ALF  Pending - Request Sent [address] [place] [address] [phone]  [facility]ALF  Pending - Request Sent [address] [place] [address] [phone]  [facility]  Pending - Request Sent [address] [place], [place] [address] [phone]  [facility]  Pending - Request Sent [address] [place] [address] [phone]  [facility]  Pending - Request Sent [address], [place] [address] [phone]  [facility]  Pending - Request Sent [phone], [place] [address] [phone]  [facility]  Pending - Request Sent [address], [place] [address] [phone]  [facility] [address] [facility]  Pending - Request Sent [address] [place] [address] [phone]  [facility]  Pending - Request Sent [phone], [place] [address] [phone]  [facility]  Pending - Request Sent [address], [place] [address] [phone]  [facility]  Pending - Request Sent [address], [place] [address] [phone]  [facility]  Pending - Request Sent [address] [place] [address] [phone]  [facility]  Pending - Request Sent [address] [place], [place] [address] [phone]  [facility]  Pending - Request Sent [address], [place] [address] [phone]  [facility]  Pending - Request Sent [address], [place] [address] [phone]  [facility]  [facility]  Pending - Request Sent [address] [place] [address] [phone]  [facility]  Pending - Request Sent [address] [place] [address] [phone]  [facility]  Pending - Request Sent [address], [place] [address] [phone]  [facility]  Pending - Request Sent [address], [place] [address] [phone]  [facility]  Pending - Request Sent [address] [place] [address] [phone]  [facility]  Pending - Request Sent [address] [place] [address] [phone]  [facility]  Pending - Request Sent [phone] [address], [place] [address] [phone]  [facility]  Pending - Request Sent [address] [place], [place] [address] [phone]  [facility]  Pending - Request Sent [address], [place] [address] [phone]  [facility]  Pending - Request Sent [address], [place] [address] [phone]  [facility]  Pending - Request Sent [phone] [place] [address] [phone]  [facility]  Pending - Request Sent [address] [place], [place] [address] [phone]  [facility]  Pending - Request Sent [address] [place], [place] [address] [phone]  [facility]F  Pending - Request Sent [address] [place], [place] [address] [phone]  [facility] [facility]  Pending - Request Sent [address], [place] [address] [phone]  [facility] [facility] SNF/ALF  Declined Facility full [address], [place] [address]  [phone]    Penni Homans, MSW, LCSW 02/27/2023 11:10 AM

## 2023-02-27 NOTE — BH IP Treatment Plan (Signed)
Interdisciplinary Treatment and Diagnostic Plan Update  02/27/2023 Time of Session: 9:30 AM  Betty Henderson MRN: [id number]  Principal Diagnosis: Schizoaffective disorder, bipolar type (HCC)  Secondary Diagnoses: Principal Problem:   Schizoaffective disorder, bipolar type (HCC)   Current Medications:  Current Facility-Administered Medications  Medication Dose Route Frequency Provider Last Rate Last Admin   acetaminophen (TYLENOL) tablet 650 mg  650 mg Oral Q6H PRN [name], DO   650 mg at 02/26/23 0902   alum & mag hydroxide-simeth (MAALOX/MYLANTA) 200-200-20 MG/5ML suspension 30 mL  30 mL Oral Q4H PRN [name], DO   30 mL at 02/26/23 1928   aspirin EC tablet 81 mg  81 mg Oral Daily [name], MD   81 mg at 02/26/23 0902   atorvastatin (LIPITOR) tablet 20 mg  20 mg Oral Daily [name], MD   20 mg at 02/26/23 0902   diphenhydrAMINE (BENADRYL) capsule 50 mg  50 mg Oral Q6H PRN [name], DO   50 mg at 09/29/22 2254   Or   diphenhydrAMINE (BENADRYL) injection 50 mg  50 mg Intramuscular Q6H PRN [name], DO       glimepiride (AMARYL) tablet 8 mg  8 mg Oral Q breakfast [name], MD   8 mg at 02/26/23 0901   haloperidol (HALDOL) tablet 5 mg  5 mg Oral Q6H PRN [name], DO   5 mg at 09/28/22 2149   Or   haloperidol lactate (HALDOL) injection 5 mg  5 mg Intramuscular Q6H PRN [name], DO       ibuprofen (ADVIL) tablet 600 mg  600 mg Oral Q6H PRN [name], [name], MD   600 mg at 02/25/23 4098   linagliptin (TRADJENTA) tablet 5 mg  5 mg Oral Daily [name], MD   5 mg at 02/26/23 0902   LORazepam (ATIVAN) tablet 1 mg  1 mg Oral TID PRN [name], DO   1 mg at 02/24/23 2139   magnesium hydroxide (MILK OF MAGNESIA) suspension 30 mL  30 mL Oral Daily PRN [name], DO   30 mL at 02/20/23 2119   metFORMIN (GLUCOPHAGE) tablet 1,000 mg  1,000 mg Oral BH-q8a4p [name], DO   1,000 mg at 02/26/23 1623   ondansetron (ZOFRAN) tablet 4 mg  4 mg Oral Q4H PRN [name], MD   4 mg at 02/26/23 2007   oxybutynin (DITROPAN) tablet 5 mg  5 mg Oral BID [name], MD   5 mg at 02/26/23 2135   paliperidone (INVEGA SUSTENNA) injection 156 mg  156 mg Intramuscular Q28 days [name], MD   156 mg at 02/12/23 1010   topiramate (TOPAMAX) tablet 25 mg  25 mg Oral BH-q8a4p [name], DO   25 mg at 02/26/23 1623   traZODone (DESYREL) tablet 50 mg  50 mg Oral QHS PRN [name], DO   50 mg at 02/25/23 2130   zonisamide (ZONEGRAN) capsule 100 mg  100 mg Oral QHS [name], DO   100 mg at 02/26/23 2135   PTA Medications: Medications Prior to Admission  Medication Sig Dispense Refill Last Dose   ondansetron (ZOFRAN) 4 MG tablet Take 1 tablet (4 mg total) by mouth every 8 (eight) hours as needed for nausea or vomiting. (Patient not taking: Reported on 08/17/2022) 20 tablet 0     Patient Stressors: Financial difficulties   Health problems  Medication change or noncompliance    Patient Strengths: Ability for insight   Treatment Modalities: Medication Management, Group therapy, Case management,  1 to 1 session with clinician, Psychoeducation, Recreational therapy.   Physician Treatment Plan for Primary Diagnosis: Schizoaffective disorder, bipolar type (HCC) Long Term Goal(s): Improvement in symptoms so as ready for discharge   Short Term Goals: Ability to identify changes in lifestyle to reduce recurrence of condition will improve Ability to verbalize feelings will improve Ability to disclose and discuss suicidal ideas Ability to demonstrate self-control will improve Ability to identify and develop effective coping behaviors will improve Ability to maintain clinical measurements within normal limits will improve Compliance with prescribed medications will improve Ability to identify triggers  associated with substance abuse/mental health issues will improve  Medication Management: Evaluate patient's response, side effects, and tolerance of medication regimen.  Therapeutic Interventions: 1 to 1 sessions, Unit Group sessions and Medication administration.  Evaluation of Outcomes: Progressing  Physician Treatment Plan for Secondary Diagnosis: Principal Problem:   Schizoaffective disorder, bipolar type (HCC)  Long Term Goal(s): Improvement in symptoms so as ready for discharge   Short Term Goals: Ability to identify changes in lifestyle to reduce recurrence of condition will improve Ability to verbalize feelings will improve Ability to disclose and discuss suicidal ideas Ability to demonstrate self-control will improve Ability to identify and develop effective coping behaviors will improve Ability to maintain clinical measurements within normal limits will improve Compliance with prescribed medications will improve Ability to identify triggers associated with substance abuse/mental health issues will improve     Medication Management: Evaluate patient's response, side effects, and tolerance of medication regimen.  Therapeutic Interventions: 1 to 1 sessions, Unit Group sessions and Medication administration.  Evaluation of Outcomes: Progressing   RN Treatment Plan for Primary Diagnosis: Schizoaffective disorder, bipolar type (HCC) Long Term Goal(s): Knowledge of disease and therapeutic regimen to maintain health will improve  Short Term Goals: Ability to remain free from injury will improve, Ability to verbalize frustration and anger appropriately will improve, Ability to demonstrate self-control, Ability to participate in decision making will improve, Ability to verbalize feelings will improve, Ability to disclose and discuss suicidal ideas, Ability to identify and develop effective coping behaviors will improve, and Compliance with prescribed medications will  improve  Medication Management: RN will administer medications as ordered by provider, will assess and evaluate patient's response and provide education to patient for prescribed medication. RN will report any adverse and/or side effects to prescribing provider.  Therapeutic Interventions: 1 on 1 counseling sessions, Psychoeducation, Medication administration, Evaluate responses to treatment, Monitor vital signs and CBGs as ordered, Perform/monitor CIWA, COWS, AIMS and Fall Risk screenings as ordered, Perform wound care treatments as ordered.  Evaluation of Outcomes: Progressing   LCSW Treatment Plan for Primary Diagnosis: Schizoaffective disorder, bipolar type (HCC) Long Term Goal(s): Safe transition to appropriate next level of care at discharge, Engage patient in therapeutic group addressing interpersonal concerns.  Short Term Goals: Engage patient in aftercare planning with referrals and resources, Increase social support, Increase ability to appropriately verbalize feelings, Increase emotional regulation, Facilitate acceptance of mental health diagnosis and concerns, Facilitate patient progression through stages of change regarding substance use diagnoses and concerns, Identify triggers associated with mental health/substance abuse issues, and Increase skills for wellness and recovery  Therapeutic Interventions: Assess for all discharge needs, 1 to 1 time with Social worker, Explore available resources and support systems, Assess for adequacy in community support network, Educate family and significant other(s) on suicide  prevention, Complete Psychosocial Assessment, Interpersonal group therapy.  Evaluation of Outcomes: Progressing   Progress in Treatment: Attending groups: Yes. and No. Participating in groups: Yes. and No. Taking medication as prescribed: Yes. Toleration medication: Yes. Family/Significant other contact made: Yes, individual(s) contacted:  Pt son Natashia Deininger,  [phone]. Patient understands diagnosis: No. Discussing patient identified problems/goals with staff: Yes. Medical problems stabilized or resolved: No. Denies suicidal/homicidal ideation: Yes. Issues/concerns per patient self-inventory: No. Other:    New problem(s) identified: No, Describe:  none Updates 12/12/22: No changes made at this time Updates 12/17/22: No changes made at this time Update 12/24/22 No changes at this time. Update 12/29/2022: No changes at this time. Update 01/03/23: No changes at this time Update 01/08/23: No changes at this time Update 01/13/23: No changes at this time Update 01/18/23: None at this time. Update 01/23/2023:  No changes at this time. Update 01/28/23: Pt recently diagnosed with Diabetes Type II  Update 02/02/23: None at this time. Update 02/07/23: No changes at this time Update 02/12/23: No changes at this time Update 02/17/23: No changes at this time. Update: 02/22/2023: No changes at this time. Update 02/27/23: No changes at this time    New Short Term/Long Term Goal(s): Update 6/30: none at this time. Update 09/27/2022:  No changes at this time.  Update 10/02/2022:  No changes at this time. Update 10/07/22: No changes at this time 10/12/22: No changes at this time Update 10/18/22: No changes at this time Update 10/23/22: No changes at this time Update 10/28/22: No changes at this time Update 11/07/22: No changes at this time Update 11/12/22: No changes at this time  Update 11/17/22: None at this time. Update 11/22/22: None at this time. Update 11/27/22 No changes at this time. Update 12/02/22 No changes at this time  Update 12/07/22: None at this time. Updates 12/12/22: No changes made at this time Updates 12/17/22: No changes made at this time Update 12/24/22 No changes at this time. Update 12/29/2022: No changes at this time. Update 01/03/23: No changes at this time Update 01/08/23: No changes at this time Update 01/13/23: No changes at this time   Update 01/18/23: No changes at this  time. Update 01/23/2023:  No changes at this time. Update 01/28/2023:  No changes at this time. Update 02/02/23: None at this time.  Update 02/07/23: No changes at this time Update 02/12/23: No changes at this time Update 02/17/23: No changes at this time. Update: 02/22/2023: No changes at this time. Update 02/27/23: No changes at this time          Patient Goals:  Update 6/30: none at this time. Update 09/27/2022:  No changes at this time. Update 10/02/2022:  No changes at this time. Update 10/07/22: No changes at this time 10/12/22: No changes at this time Update 10/18/22: No changes at this time Update 10/23/22: No changes at this time Update 10/28/22: No changes at this time Update 11/07/22: No changes at this time Update 11/12/22: No changes at this time Update 11/17/22: None at this time. Update 11/22/22: None at this time. Update 11/27/22 No changes at this time Update 12/02/22 No changes at this time   Update 12/07/22: None at this time. Updates 12/12/22: No changes made at this time Updates 12/17/22: No changes made at this time Update 12/24/22 No changes at this time. Update 12/29/2022: No changes at this time. Update 01/03/23: No changes at this time Update 01/08/23: No changes at this time Update 01/13/23: No changes at  this time   Update 01/18/23: No changes at this time. Update 01/23/2023:  No changes at this time.  Update 01/28/2023:  No changes at this time. Update 02/02/23: None at this time.  Update 02/07/23: No changes at this time Update 02/12/23: No changes at this time Update 02/17/23: No changes at this time. Update: 02/22/2023: No changes at this time. Update 02/27/23: No changes at this time          Discharge Plan or Barriers: Update 6/30: APS report has been made and patient is being investigated for guardianship needs.  Remains homeless with limited supports. Update 09/27/2022:  No changes at this time.  Update 10/02/2022:  Patient remains safe on the unit at this time.  Patient remains psychotic at this  time.  APS report has been made, however, no follow up from the caseworker on her case.  No safe discharge identified.  CSW has requested that application for Medicaid and disability be completed.   Update 10/07/22: No changes at this time 10/12/22: No changes at this time Update 10/18/22: No changes at this time Update 10/23/22: No changes at this time Update 10/23/22: No changes at this time Update 10/28/22: According to [facility], pt's caseworker at DSS, the petition was filed for guardianship on 10/21/22, now awaiting for the petition to be approvedUpdate 11/07/22: No changes at this time Update 11/12/22: CSW sent over patients information to [facility] and [facility], awaiting a response. CSW awaiting word from DSS regarding pt's petition and what agency will become her guardian. Update 11/17/22: No changes at this time. Update 11/22/22: CSW has sent patients information to multiple facilities that were suggested by leadership, pt has been denied from the facility or the facility does not respond. CSW continues to send pt's information to nursing homes. Update 11/27/22 CSW contacted DSS to inquire about the status of guardianship. No response at this time Update 12/02/22 Supervisor Loraine Leriche contacted DSS who stated they are not taking pt on for guardianship but that Quincy Carnes will be present tomorrow to do a visit with social worker and pt and that pt's son will be signing documentation for placement for the pt. Update 12/07/22: [facility] DSS has declined to take guardianship. Son, Casimiro Needle is to step into a more present role in deciding placement. A conversation is still needed. Updates 12/12/22: CSW staffed case with leadership and they state that a family meeting must happen with pt's son. CSW contacted pt's financial navigator and her DSS worker so that they may be present on the call and are able to speak to the efforts they have made to secure pt funding and housing. CSW waiting on responses from  both, leadership is aware. Updates 12/17/22: CSW awaiting updates from both [facility] DSS and [facility] financial navigators as to status of pt's applications for Medicaid/[employer] and Disability, respectively. Update 12/24/22 CSW sent email regarding family meeting per request of supervisors. CSW awaiting responses from all parties to schedule family meeting. Update: 12/29/2022 No changes at this time. Update 01/03/23: CSW continues to await responses on the scheduling of the family meeting. CSW received call from Primitivo Gauze at [employer], CSW sent FL2 to [place] to aide in pt's discharge planning. Update 01/08/23: CSW has reached out again to leadership and financial counseling. CSW continues to await a response. CSW called Sharlee Blew, awaiting a response for status of pt's placement options. Update 01/13/23: Pt is to meet with psychologist tomorrow 10/22 to complete capacity testing so that DSS  can retry for guardianship  Update 01/18/23: The patient is still awaiting placement.  Update 01/23/2023:  Patient remains safe on the unit at this time.  CSW team continues to work on placement.  CSW team in contact with DSS to develop plan on guardianship.   Work continues to Hospital doctor. Update 01/28/2023:  Medicaid application remains pending. DSS awaiting records from medical records to retry fo guardianship.  Update 02/02/23: None at this time.  Update 02/07/23: Sharlee Blew at DSS confirms that pt records were received and that they are petitioning for guardianship, she informs CSW that decision should be made on court date by Monday or Tuesday of next week. CSW still awaits status update on Medicaid application Update 02/12/23: Medicaid Application still pending. TOC Supervisors recommend that CSW continue searching for placement as we await DSS taking guardianship. Update 02/17/23: DSS confirms guardianship date for January 14th, 2025. CSW continue to search for placement. Update:  02/22/2023: No changes at this time.  Update 02/27/23: CSW sent pt's FL2 to multiple different placements. Awaiting to hear back from placements.        Reason for Continuation of Hospitalization: Hallucinations Mania Medication stabilization   Estimated Length of Stay:  Update 6/30: 1-7 days Update 09/27/2022:  TBD Update 10/02/2022:  No changes at this time. UpdaUpdate 10/18/22: No changes at this time te 10/07/22: No changes at this time Update 10/18/22: No changes at this time Update 10/23/22: No changes at this time Update 10/28/22: No changes at this time Update 11/07/22: No changes at this time Update 11/12/22: No changes at this time  Update 11/17/22: No changes at this time. Update 11/22/22: None at this time. Update 11/27/22 No changes at this time. Update 12/02/22 No changes at this time  Update 12/07/22: None at this time. Updates 12/12/22: No changes made at this time Updates 12/17/22: TBD. Update 12/29/2022: No changes at this time. Update 01/03/23: TBDUpdate 01/08/23: TBD Update 01/13/23: TBD  Update 01/18/23: No changes at this time.  Update 01/23/2023:  TBD Update 01/28/2023:  TBD Update 02/02/23: None at this time. Update 02/07/23: TBD Update 02/12/23: TBD Update 02/17/23: TBD. Update: 02/22/2023: TBD.  Last 3 [place] Suicide Severity Risk Score: Flowsheet Row Admission (Current) from 08/22/2022 in [facility] BEH[facility]INE ED from 08/17/2022 in [facility] Emergency Department at [facility] ED from 08/12/2022 in [facility] Emergency Department at [facility]  C-SSRS RISK CATEGORY Low Risk No Risk No Risk       Last [facility] 2/9 Scores:     No data to display          Scribe for Treatment Team: Elza Rafter, Theresia Majors 02/27/2023 9:47 AM

## 2023-02-27 NOTE — Group Note (Signed)
LCSW Group Therapy Note  Group Date: 02/27/2023 Start Time: 1500 End Time: 1600   Type of Therapy and Topic:  Group Therapy - Healthy vs Unhealthy Coping Skills  Participation Level:  Active   Description of Group The focus of this group was to determine what unhealthy coping techniques typically are used by group members and what healthy coping techniques would be helpful in coping with various problems. Patients were guided in becoming aware of the differences between healthy and unhealthy coping techniques. Patients were asked to identify 2-3 healthy coping skills they would like to learn to use more effectively.  Therapeutic Goals Patients learned that coping is what human beings do all day long to deal with various situations in their lives Patients defined and discussed healthy vs unhealthy coping techniques Patients identified their preferred coping techniques and identified whether these were healthy or unhealthy Patients determined 2-3 healthy coping skills they would like to become more familiar with and use more often. Patients provided support and ideas to each other   Summary of Patient Progress:  During group, Mitali expressed feelings of sadness about being here so long. Patient proved open to input from peers and feedback from CSW. Patient demonstrated good insight into the subject matter, was respectful of peers, and participated throughout the entire session.   Therapeutic Modalities Cognitive Behavioral Therapy Motivational Interviewing  Elza Rafter, [place] 02/27/2023  4:14 PM

## 2023-02-27 NOTE — Plan of Care (Signed)
D: Pt alert and oriented. Pt denies experiencing any anxiety/depression at this time. Pt denies experiencing any pain at this time. Pt denies experiencing any SI/HI, or AVH at this time.   A: Scheduled medications administered to pt, per MD orders. Support and encouragement provided. Frequent verbal contact made. Routine safety checks conducted q15 minutes.   R: No adverse drug reactions noted. Pt verbally contracts for safety at this time. Pt compliant with medications and treatment plan. Pt interacts minimally with others on the unit. Pt remains safe at this time. Plan of care ongoing.  Pt can be irritable/defensive/argumentative at times. For example: When pt received a cheeseburger for lunch she stated that she did not order it and wanted a chef's salad. When asked who filled her out her menu pt stated I don't know but it wasn't me, I'll tired of hamburgers and spaghetti because it's making me sick. Pt was notified that once she finishes filling out her menu each day for the following day that a staff member would bring it back to her to sign and date as a means to confirm she was indeed the one who filled it out the pt stated she would run and hide. Pt was educated that in order to receive the desired food she needed to fill out her daily menu or she would get a house tray and it may not be what she like such as hamburgers and spaghetti. Pt educated that when she chooses to fill out her menu if there is a mistake we are able to correct it however, if there is no mistake in the order or you choose not to fill out the menu we can not order new food as it gets charged to the unit and is found as an inappropriate use of funds. Pt does not understand or acknowledge there being a problem.     Problem: Nutrition: Goal: Adequate nutrition will be maintained Outcome: Progressing   Problem: Coping: Goal: Level of anxiety will decrease Outcome: Progressing

## 2023-02-28 DIAGNOSIS — F250 Schizoaffective disorder, bipolar type: Secondary | ICD-10-CM | POA: Diagnosis not present

## 2023-02-28 NOTE — Group Note (Signed)
Recreation Therapy Group Note   Group Topic:General Recreation  Group Date: 02/28/2023 Start Time: 1400 End Time: 1500 Facilitators: Rosina Lowenstein, LRT, CTRS Location:  Dayroom  Group Description: Bingo. LRT and patients played multiple games of Bingo with music playing in the background. LRT and pts discussed how this could be a leisure interest and the importance of doing things they enjoy post-discharge. Pts won stress balls and Chapstick as Chief Financial Officer.   Goal Area(s) Addressed: Patient will identify leisure interests.  Patient will practice healthy decision making. Patient will engage in recreation activity.  Patient will increase communication.    Affect/Mood: Appropriate   Participation Level: Active and Engaged   Participation Quality: Independent   Behavior: Appropriate, Calm, and Cooperative   Speech/Thought Process: Coherent   Insight: Good   Judgement: Good   Modes of Intervention: Competitive Play   Patient Response to Interventions:  Attentive, Engaged, Interested , and Receptive   Education Outcome:  Acknowledges education   Clinical Observations/Individualized Feedback: Mc was active in their participation of session activities and group discussion. Pt won a Facilities manager and chose chapstick as her prize. Pt interacted well with LRT and peers duration of session.    Plan: Continue to engage patient in RT group sessions 2-3x/week.   Rosina Lowenstein, LRT, CTRS 02/28/2023 5:05 PM

## 2023-02-28 NOTE — Plan of Care (Signed)
Pt received in bed with eyes open. RR even and unlabored. Flat affect. Incont of urine. OOB for meds/meals. C/o heartburn and nausea.  PRNs given for indigestion and N/V. Tol well. Limited interaction with peers and staff. Q`5 min checks maintained for safety. Denies SI/HI. No behavior issues noted. Pt remains safe at this time.   Problem: Nutrition: Goal: Adequate nutrition will be maintained Outcome: Progressing   Problem: Coping: Goal: Level of anxiety will decrease Outcome: Progressing   Problem: Pain Managment: Goal: General experience of comfort will improve Outcome: Progressing   Problem: Safety: Goal: Ability to remain free from injury will improve Outcome: Progressing   Problem: Skin Integrity: Goal: Risk for impaired skin integrity will decrease Outcome: Progressing

## 2023-02-28 NOTE — Progress Notes (Signed)
[facility] MD Progress Note  02/28/2023 11:18 AM Betty Henderson  MRN:  [id number] Subjective: Betty Henderson is seen on rounds.  Social work reports no changes in her disposition.  She has been pleasant and cooperative on the unit.  No problems.  Compliant with medications and no side effects except for weight gain which she is now on Topamax and Zonegran. Principal Problem: Schizoaffective disorder, bipolar type (HCC) Diagnosis: Principal Problem:   Schizoaffective disorder, bipolar type (HCC)  Total Time spent with patient: 15 minutes  Past Psychiatric History: History of schizoaffective, bipolar disorder  Past Medical History:  Past Medical History:  Diagnosis Date   Anemia    Arthritis    Chronic pain    Drug-seeking behavior    Malingering    Osteopetrosis    Psychosis (HCC)    Schizoaffective disorder, bipolar type (HCC)     Past Surgical History:  Procedure Laterality Date   MOUTH SURGERY     TUBAL LIGATION     Family History:  Family History  Family history unknown: Yes   Family Psychiatric  History: Unremarkable Social History:  Social History   Substance and Sexual Activity  Alcohol Use Not Currently   Comment: 1 cocktail 3 weeks ago     Social History   Substance and Sexual Activity  Drug Use Not Currently   Types: Cocaine   Comment: states "it's legal though"    Social History   Socioeconomic History   Marital status: Single    Spouse name: Not on file   Number of children: Not on file   Years of education: Not on file   Highest education level: Not on file  Occupational History   Not on file  Tobacco Use   Smoking status: Every Day    Current packs/day: 0.50    Types: Cigarettes   Smokeless tobacco: Never  Vaping Use   Vaping status: Never Used  Substance and Sexual Activity   Alcohol use: Not Currently    Comment: 1 cocktail 3 weeks ago   Drug use: Not Currently    Types: Cocaine    Comment: states "it's legal though"   Sexual activity: Never   Other Topics Concern   Not on file  Social History Narrative   Not on file   Social Determinants of Health   Financial Resource Strain: Not on file  Food Insecurity: Food Insecurity Present (08/22/2022)   Hunger Vital Sign    Worried About Running Out of Food in the Last Year: Sometimes true    Ran Out of Food in the Last Year: Sometimes true  Transportation Needs: Patient Unable To Answer (08/22/2022)   PRAPARE - Transportation    Lack of Transportation (Medical): Patient unable to answer    Lack of Transportation (Non-Medical): Patient unable to answer  Recent Concern: Transportation Needs - Unmet Transportation Needs (06/13/2022)   Received from [facility], [facility]   [facility] - Transportation    Lack of Transportation (Medical): Not on file    Lack of Transportation (Non-Medical): Yes  Physical Activity: Not on file  Stress: Not on file  Social Connections: Unknown (08/06/2021)   Received from [facility], [facility]   Social Network    Social Network: Not on file   Additional Social History:                         Sleep: Good  Appetite:  Good  Current Medications: Current Facility-Administered Medications  Medication  Dose Route Frequency Provider Last Rate Last Admin   acetaminophen (TYLENOL) tablet 650 mg  650 mg Oral Q6H PRN [name], DO   650 mg at 02/26/23 0902   alum & mag hydroxide-simeth (MAALOX/MYLANTA) 200-200-20 MG/5ML suspension 30 mL  30 mL Oral Q4H PRN [name], DO   30 mL at 02/28/23 0153   aspirin EC tablet 81 mg  81 mg Oral Daily [name], MD   81 mg at 02/28/23 3536   atorvastatin (LIPITOR) tablet 20 mg  20 mg Oral Daily [name], MD   20 mg at 02/28/23 1443   diphenhydrAMINE (BENADRYL) capsule 50 mg  50 mg Oral Q6H PRN [name], DO   50 mg at 09/29/22 2254   Or   diphenhydrAMINE (BENADRYL) injection 50 mg  50 mg Intramuscular Q6H PRN [name], DO        glimepiride (AMARYL) tablet 8 mg  8 mg Oral Q breakfast [name], MD   8 mg at 02/28/23 1540   haloperidol (HALDOL) tablet 5 mg  5 mg Oral Q6H PRN [name], DO   5 mg at 09/28/22 2149   Or   haloperidol lactate (HALDOL) injection 5 mg  5 mg Intramuscular Q6H PRN [name], DO       ibuprofen (ADVIL) tablet 600 mg  600 mg Oral Q6H PRN [name], [name], MD   600 mg at 02/25/23 0867   linagliptin (TRADJENTA) tablet 5 mg  5 mg Oral Daily [name], MD   5 mg at 02/28/23 6195   LORazepam (ATIVAN) tablet 1 mg  1 mg Oral TID PRN [name], DO   1 mg at 02/24/23 2139   magnesium hydroxide (MILK OF MAGNESIA) suspension 30 mL  30 mL Oral Daily PRN [name], DO   30 mL at 02/27/23 2147   metFORMIN (GLUCOPHAGE) tablet 1,000 mg  1,000 mg Oral BH-q8a4p [name], DO   1,000 mg at 02/28/23 0932   ondansetron (ZOFRAN) tablet 4 mg  4 mg Oral Q4H PRN [name], MD   4 mg at 02/27/23 2016   oxybutynin (DITROPAN) tablet 5 mg  5 mg Oral BID [name], MD   5 mg at 02/28/23 0923   paliperidone (INVEGA SUSTENNA) injection 156 mg  156 mg Intramuscular Q28 days [name], MD   156 mg at 02/12/23 1010   pantoprazole (PROTONIX) EC tablet 80 mg  80 mg Oral Daily [name], DO   80 mg at 02/28/23 6712   topiramate (TOPAMAX) tablet 25 mg  25 mg Oral BH-q8a4p [name], DO   25 mg at 02/28/23 4580   traZODone (DESYREL) tablet 50 mg  50 mg Oral QHS PRN [name], DO   50 mg at 02/25/23 2130   zonisamide (ZONEGRAN) capsule 100 mg  100 mg Oral QHS [name], DO   100 mg at 02/27/23 2147    Lab Results: No results found for this or any previous visit (from the past 48 hour(s)).  Blood Alcohol level:  Lab Results  Component Value Date   [facility] <10 08/17/2022   ETH <10 01/11/2021    Metabolic Disorder Labs: Lab Results  Component Value Date   HGBA1C 11.0 (H) 01/27/2023    MPG 269 01/27/2023   No results found for: "PROLACTIN" Lab Results  Component Value Date   CHOL 259 (H) 01/27/2023   TRIG 155 (  H) 01/27/2023   HDL 65 01/27/2023   CHOLHDL 4.0 01/27/2023   VLDL 31 01/27/2023   LDLCALC 163 (H) 01/27/2023    Physical Findings: AIMS:  , ,  ,  ,    CIWA:    COWS:     Musculoskeletal: Strength & Muscle Tone: within normal limits Gait & Station: normal Patient leans: N/A  Psychiatric Specialty Exam:  Presentation  General Appearance:  Casual; Neat  Eye Contact: Fair  Speech: Slow; Slurred  Speech Volume: Decreased  Handedness: Right   Mood and Affect  Mood: Anxious  Affect: Inappropriate   Thought Process  Thought Processes: Disorganized  Descriptions of Associations:Loose  Orientation:Partial  Thought Content:Illogical; Rumination  History of Schizophrenia/Schizoaffective disorder:No data recorded Duration of Psychotic Symptoms:No data recorded Hallucinations:No data recorded Ideas of Reference:Paranoia  Suicidal Thoughts:No data recorded Homicidal Thoughts:No data recorded  Sensorium  Memory: Immediate Fair; Remote Poor  Judgment: Poor  Insight: Poor   Executive Functions  Concentration: Poor  Attention Span: Fair  Recall: [employer] of Knowledge: Fair  Language: Fair   Psychomotor Activity  Psychomotor Activity:No data recorded  Assets  Assets: Communication Skills   Sleep  Sleep:No data recorded    Blood pressure 102/60, pulse 77, temperature (!) 97.1 F (36.2 C), resp. rate 16, height 5\' 6"  (1.676 m), weight 86 kg, SpO2 94%. Body mass index is 30.59 kg/m.   Treatment Plan Summary: Daily contact with patient to assess and evaluate symptoms and progress in treatment, Medication management, and Plan continue current medications.  [name], DO 02/28/2023, 11:18 AM

## 2023-02-28 NOTE — Progress Notes (Signed)
   02/28/23 0615  15 Minute Checks  Location Bedroom  Visual Appearance Calm  Behavior Sleeping  Sleep (Behavioral Health Patients Only)  Calculate sleep? (Click Yes once per 24 hr at 0600 safety check) Yes  Documented sleep last 24 hours 9.25

## 2023-02-28 NOTE — Plan of Care (Signed)
D: Pt alert and oriented. Pt denies experiencing any anxiety/depression at this time. Pt reports/denies experiencing any pain at this time. Pt denies/reports experiencing any SI/HI, or AVH at this time.   A: Scheduled medications administered to pt, per MD orders. Support and encouragement provided. Frequent verbal contact made. Routine safety checks conducted q15 minutes.   R: No adverse drug reactions noted. Pt verbally contracts for safety at this time. Pt compliant with medications and treatment plan. Pt interacts well with others on the unit. Pt remains safe at this time. Plan of care ongoing.  Problem: Nutrition: Goal: Adequate nutrition will be maintained Outcome: Progressing   Problem: Pain Managment: Goal: General experience of comfort will improve Outcome: Progressing

## 2023-03-01 DIAGNOSIS — F250 Schizoaffective disorder, bipolar type: Secondary | ICD-10-CM | POA: Diagnosis not present

## 2023-03-01 NOTE — Progress Notes (Signed)
 Patient pleasant and smiling.  Denies SI/HI or AVH.  Denies anxiety and depression. Denies pain.  Reports she slept well.  Patient was assisted with showering this morning by MHT.    Compliant with scheduled medications.  15 min checks in place for safety.  Patient isolates to room with exception of meals.  Minimal interaction with peers.

## 2023-03-01 NOTE — Plan of Care (Signed)
  Problem: Nutrition: Goal: Adequate nutrition will be maintained Outcome: Progressing   Problem: Coping: Goal: Level of anxiety will decrease Outcome: Progressing   

## 2023-03-01 NOTE — Plan of Care (Signed)
Patient alert and oriented.  Pt received in bed with eyes open. RR even and unlabored. Denies anxiety and depression.  Po medications given as scheduled. Tol well. Limited interaction with peers and staff. Q 15 min checks maintained for safety. Denies SI, HI, AVH, and pain. Patient remains safe at this time.   Problem: Nutrition: Goal: Adequate nutrition will be maintained Outcome: Progressing   Problem: Coping: Goal: Level of anxiety will decrease Outcome: Progressing   Problem: Elimination: Goal: Will not experience complications related to bowel motility Outcome: Progressing Goal: Will not experience complications related to urinary retention Outcome: Progressing   Problem: Pain Managment: Goal: General experience of comfort will improve Outcome: Progressing   Problem: Safety: Goal: Ability to remain free from injury will improve Outcome: Progressing

## 2023-03-01 NOTE — Progress Notes (Signed)
   03/01/23 0630  15 Minute Checks  Location Bedroom  Visual Appearance Calm  Behavior Sleeping  Sleep (Behavioral Health Patients Only)  Calculate sleep? (Click Yes once per 24 hr at 0600 safety check) Yes  Documented sleep last 24 hours 13.5

## 2023-03-01 NOTE — Group Note (Signed)
Date:  03/01/2023 Time:  8:22 PM  Group Topic/Focus:  Self Care:   The focus of this group is to help patients understand the importance of self-care in order to improve or restore emotional, physical, spiritual, interpersonal, and financial health.    Participation Level:  Active  Participation Quality:  Appropriate  Affect:  Appropriate  Cognitive:  Appropriate  Insight: Good  Engagement in Group:  Engaged  Modes of Intervention:  Clarification, Confrontation, and Discussion  Additional Comments:    [name] 03/01/2023, 8:22 PM

## 2023-03-01 NOTE — Progress Notes (Signed)
[facility] MD Progress Note  03/01/2023 10:21 AM Betty Henderson  MRN:  [id number] Subjective: Betty Henderson was seen on rounds.  No complaints and no changes.  Parents report no problems.  She has been compliant with medication.  No side effects.  Social work continues to work and look for placement. Principal Problem: Schizoaffective disorder, bipolar type (HCC) Diagnosis: Principal Problem:   Schizoaffective disorder, bipolar type (HCC)  Total Time spent with patient: 15 minutes  Past Psychiatric History: Schizoaffective disorder polysubstance abuse  Past Medical History:  Past Medical History:  Diagnosis Date   Anemia    Arthritis    Chronic pain    Drug-seeking behavior    Malingering    Osteopetrosis    Psychosis (HCC)    Schizoaffective disorder, bipolar type (HCC)     Past Surgical History:  Procedure Laterality Date   MOUTH SURGERY     TUBAL LIGATION     Family History:  Family History  Family history unknown: Yes   Family Psychiatric  History: Unremarkable Social History:  Social History   Substance and Sexual Activity  Alcohol Use Not Currently   Comment: 1 cocktail 3 weeks ago     Social History   Substance and Sexual Activity  Drug Use Not Currently   Types: Cocaine   Comment: states "it's legal though"    Social History   Socioeconomic History   Marital status: Single    Spouse name: Not on file   Number of children: Not on file   Years of education: Not on file   Highest education level: Not on file  Occupational History   Not on file  Tobacco Use   Smoking status: Every Day    Current packs/day: 0.50    Types: Cigarettes   Smokeless tobacco: Never  Vaping Use   Vaping status: Never Used  Substance and Sexual Activity   Alcohol use: Not Currently    Comment: 1 cocktail 3 weeks ago   Drug use: Not Currently    Types: Cocaine    Comment: states "it's legal though"   Sexual activity: Never  Other Topics Concern   Not on file  Social History  Narrative   Not on file   Social Determinants of Health   Financial Resource Strain: Not on file  Food Insecurity: Food Insecurity Present (08/22/2022)   Hunger Vital Sign    Worried About Running Out of Food in the Last Year: Sometimes true    Ran Out of Food in the Last Year: Sometimes true  Transportation Needs: Patient Unable To Answer (08/22/2022)   PRAPARE - Transportation    Lack of Transportation (Medical): Patient unable to answer    Lack of Transportation (Non-Medical): Patient unable to answer  Recent Concern: Transportation Needs - Unmet Transportation Needs (06/13/2022)   Received from [facility], [facility]   [facility] - Transportation    Lack of Transportation (Medical): Not on file    Lack of Transportation (Non-Medical): Yes  Physical Activity: Not on file  Stress: Not on file  Social Connections: Unknown (08/06/2021)   Received from [facility], [facility]   Social Network    Social Network: Not on file   Additional Social History:                         Sleep: Good  Appetite:  Good  Current Medications: Current Facility-Administered Medications  Medication Dose Route Frequency Provider Last Rate Last Admin   acetaminophen (  TYLENOL) tablet 650 mg  650 mg Oral Q6H PRN [name], DO   650 mg at 02/26/23 0902   alum & mag hydroxide-simeth (MAALOX/MYLANTA) 200-200-20 MG/5ML suspension 30 mL  30 mL Oral Q4H PRN [name], DO   30 mL at 02/28/23 0153   aspirin EC tablet 81 mg  81 mg Oral Daily [name], MD   81 mg at 03/01/23 0926   atorvastatin (LIPITOR) tablet 20 mg  20 mg Oral Daily [name], MD   20 mg at 03/01/23 1610   diphenhydrAMINE (BENADRYL) capsule 50 mg  50 mg Oral Q6H PRN [name], DO   50 mg at 09/29/22 2254   Or   diphenhydrAMINE (BENADRYL) injection 50 mg  50 mg Intramuscular Q6H PRN [name], DO       glimepiride (AMARYL) tablet 8 mg  8 mg Oral Q breakfast [name], MD   8 mg at 03/01/23 9604   haloperidol (HALDOL) tablet 5 mg  5 mg Oral Q6H PRN [name], DO   5 mg at 09/28/22 2149   Or   haloperidol lactate (HALDOL) injection 5 mg  5 mg Intramuscular Q6H PRN [name], DO       ibuprofen (ADVIL) tablet 600 mg  600 mg Oral Q6H PRN [name], [name], MD   600 mg at 02/25/23 5409   linagliptin (TRADJENTA) tablet 5 mg  5 mg Oral Daily [name], MD   5 mg at 03/01/23 8119   LORazepam (ATIVAN) tablet 1 mg  1 mg Oral TID PRN [name], DO   1 mg at 02/24/23 2139   magnesium hydroxide (MILK OF MAGNESIA) suspension 30 mL  30 mL Oral Daily PRN [name], DO   30 mL at 02/27/23 2147   metFORMIN (GLUCOPHAGE) tablet 1,000 mg  1,000 mg Oral BH-q8a4p [name], DO   1,000 mg at 03/01/23 0925   ondansetron (ZOFRAN) tablet 4 mg  4 mg Oral Q4H PRN [name], MD   4 mg at 02/27/23 2016   oxybutynin (DITROPAN) tablet 5 mg  5 mg Oral BID [name], MD   5 mg at 03/01/23 0926   paliperidone (INVEGA SUSTENNA) injection 156 mg  156 mg Intramuscular Q28 days [name], MD   156 mg at 02/12/23 1010   pantoprazole (PROTONIX) EC tablet 80 mg  80 mg Oral Daily [name], DO   80 mg at 03/01/23 0925   topiramate (TOPAMAX) tablet 25 mg  25 mg Oral BH-q8a4p [name], DO   25 mg at 03/01/23 1478   traZODone (DESYREL) tablet 50 mg  50 mg Oral QHS PRN [name], DO   50 mg at 02/25/23 2130   zonisamide (ZONEGRAN) capsule 100 mg  100 mg Oral QHS [name], DO   100 mg at 02/28/23 2130    Lab Results: No results found for this or any previous visit (from the past 48 hour(s)).  Blood Alcohol level:  Lab Results  Component Value Date   ETH <10 08/17/2022   ETH <10 01/11/2021    Metabolic Disorder Labs: Lab Results  Component Value Date   HGBA1C 11.0 (H) 01/27/2023   MPG 269 01/27/2023   No results found for:  "PROLACTIN" Lab Results  Component Value Date   CHOL 259 (H) 01/27/2023   TRIG 155 (H) 01/27/2023   HDL 65 01/27/2023   CHOLHDL 4.0  01/27/2023   VLDL 31 01/27/2023   LDLCALC 163 (H) 01/27/2023    Physical Findings: AIMS:  , ,  ,  ,    CIWA:    COWS:     Musculoskeletal: Strength & Muscle Tone: within normal limits Gait & Station: normal Patient leans: N/A  Psychiatric Specialty Exam:  Presentation  General Appearance:  Casual; Neat  Eye Contact: Fair  Speech: Slow; Slurred  Speech Volume: Decreased  Handedness: Right   Mood and Affect  Mood: Anxious  Affect: Inappropriate   Thought Process  Thought Processes: Disorganized  Descriptions of Associations:Loose  Orientation:Partial  Thought Content:Illogical; Rumination  History of Schizophrenia/Schizoaffective disorder:No data recorded Duration of Psychotic Symptoms:No data recorded Hallucinations:No data recorded Ideas of Reference:Paranoia  Suicidal Thoughts:No data recorded Homicidal Thoughts:No data recorded  Sensorium  Memory: Immediate Fair; Remote Poor  Judgment: Poor  Insight: Poor   Executive Functions  Concentration: Poor  Attention Span: Fair  Recall: [employer] of Knowledge: Fair  Language: Fair   Psychomotor Activity  Psychomotor Activity:No data recorded  Assets  Assets: Communication Skills   Sleep  Sleep:No data recorded    Blood pressure 107/71, pulse 79, temperature (!) 97.3 F (36.3 C), resp. rate 20, height 5\' 6"  (1.676 m), weight 86 kg, SpO2 99%. Body mass index is 30.59 kg/m.   Treatment Plan Summary: Daily contact with patient to assess and evaluate symptoms and progress in treatment, Medication management, and Plan continue current medication.  [name], DO 03/01/2023, 10:21 AM

## 2023-03-01 NOTE — Plan of Care (Signed)
  Problem: Education: Goal: Knowledge of General Education information will improve Description: Including pain rating scale, medication(s)/side effects and non-pharmacologic comfort measures Outcome: Progressing   Problem: Nutrition: Goal: Adequate nutrition will be maintained Outcome: Progressing   Problem: Coping: Goal: Level of anxiety will decrease Outcome: Progressing   Problem: Pain Managment: Goal: General experience of comfort will improve Outcome: Progressing   Problem: Safety: Goal: Ability to remain free from injury will improve Outcome: Progressing   Problem: Skin Integrity: Goal: Risk for impaired skin integrity will decrease Outcome: Progressing

## 2023-03-02 DIAGNOSIS — F250 Schizoaffective disorder, bipolar type: Secondary | ICD-10-CM | POA: Diagnosis not present

## 2023-03-02 NOTE — Plan of Care (Signed)
  Problem: Education: Goal: Knowledge of General Education information will improve Description: Including pain rating scale, medication(s)/side effects and non-pharmacologic comfort measures Outcome: Progressing   Problem: Nutrition: Goal: Adequate nutrition will be maintained Outcome: Progressing   Problem: Coping: Goal: Level of anxiety will decrease Outcome: Progressing   

## 2023-03-02 NOTE — Progress Notes (Signed)
[facility] MD Progress Note  03/02/2023 12:06 PM Betty Henderson  MRN:  [id number] Subjective: Betty Henderson is seen on rounds.  She has no complaints.  She has been in good controls.  No side effects from her medication.  She has been pleasant and cooperative.  Social work continues to look for placement.  She apparently has a meeting with the state on January 14.  She denies any suicidal ideation. Principal Problem: Schizoaffective disorder, bipolar type (HCC) Diagnosis: Principal Problem:   Schizoaffective disorder, bipolar type (HCC)  Total Time spent with patient: 15 minutes  Past Psychiatric History: Schizoaffective disorder  Past Medical History:  Past Medical History:  Diagnosis Date   Anemia    Arthritis    Chronic pain    Drug-seeking behavior    Malingering    Osteopetrosis    Psychosis (HCC)    Schizoaffective disorder, bipolar type (HCC)     Past Surgical History:  Procedure Laterality Date   MOUTH SURGERY     TUBAL LIGATION     Family History:  Family History  Family history unknown: Yes   Family Psychiatric  History: Unremarkable Social History:  Social History   Substance and Sexual Activity  Alcohol Use Not Currently   Comment: 1 cocktail 3 weeks ago     Social History   Substance and Sexual Activity  Drug Use Not Currently   Types: Cocaine   Comment: states "it's legal though"    Social History   Socioeconomic History   Marital status: Single    Spouse name: Not on file   Number of children: Not on file   Years of education: Not on file   Highest education level: Not on file  Occupational History   Not on file  Tobacco Use   Smoking status: Every Day    Current packs/day: 0.50    Types: Cigarettes   Smokeless tobacco: Never  Vaping Use   Vaping status: Never Used  Substance and Sexual Activity   Alcohol use: Not Currently    Comment: 1 cocktail 3 weeks ago   Drug use: Not Currently    Types: Cocaine    Comment: states "it's legal though"    Sexual activity: Never  Other Topics Concern   Not on file  Social History Narrative   Not on file   Social Determinants of Health   Financial Resource Strain: Not on file  Food Insecurity: Food Insecurity Present (08/22/2022)   Hunger Vital Sign    Worried About Running Out of Food in the Last Year: Sometimes true    Ran Out of Food in the Last Year: Sometimes true  Transportation Needs: Patient Unable To Answer (08/22/2022)   PRAPARE - Transportation    Lack of Transportation (Medical): Patient unable to answer    Lack of Transportation (Non-Medical): Patient unable to answer  Recent Concern: Transportation Needs - Unmet Transportation Needs (06/13/2022)   Received from [facility], [facility]   [facility] - Transportation    Lack of Transportation (Medical): Not on file    Lack of Transportation (Non-Medical): Yes  Physical Activity: Not on file  Stress: Not on file  Social Connections: Unknown (08/06/2021)   Received from [facility], [facility]   Social Network    Social Network: Not on file   Additional Social History:                         Sleep: Good  Appetite:  Good  Current Medications: Current Facility-Administered Medications  Medication Dose Route Frequency Provider Last Rate Last Admin   acetaminophen (TYLENOL) tablet 650 mg  650 mg Oral Q6H PRN [name], DO   650 mg at 02/26/23 0902   alum & mag hydroxide-simeth (MAALOX/MYLANTA) 200-200-20 MG/5ML suspension 30 mL  30 mL Oral Q4H PRN [name], DO   30 mL at 02/28/23 0153   aspirin EC tablet 81 mg  81 mg Oral Daily [name], MD   81 mg at 03/02/23 1610   atorvastatin (LIPITOR) tablet 20 mg  20 mg Oral Daily [name], MD   20 mg at 03/02/23 0813   diphenhydrAMINE (BENADRYL) capsule 50 mg  50 mg Oral Q6H PRN [name], DO   50 mg at 09/29/22 2254   Or   diphenhydrAMINE (BENADRYL) injection 50 mg  50 mg Intramuscular Q6H PRN [name], DO       glimepiride (AMARYL) tablet 8 mg  8 mg Oral Q breakfast [name], MD   8 mg at 03/02/23 9604   haloperidol (HALDOL) tablet 5 mg  5 mg Oral Q6H PRN [name], DO   5 mg at 09/28/22 2149   Or   haloperidol lactate (HALDOL) injection 5 mg  5 mg Intramuscular Q6H PRN [name], DO       ibuprofen (ADVIL) tablet 600 mg  600 mg Oral Q6H PRN [name], [name], MD   600 mg at 02/25/23 5409   linagliptin (TRADJENTA) tablet 5 mg  5 mg Oral Daily [name], MD   5 mg at 03/02/23 8119   LORazepam (ATIVAN) tablet 1 mg  1 mg Oral TID PRN [name], DO   1 mg at 02/24/23 2139   magnesium hydroxide (MILK OF MAGNESIA) suspension 30 mL  30 mL Oral Daily PRN [name], DO   30 mL at 02/27/23 2147   metFORMIN (GLUCOPHAGE) tablet 1,000 mg  1,000 mg Oral BH-q8a4p [name], DO   1,000 mg at 03/02/23 1478   ondansetron (ZOFRAN) tablet 4 mg  4 mg Oral Q4H PRN [name], MD   4 mg at 02/27/23 2016   oxybutynin (DITROPAN) tablet 5 mg  5 mg Oral BID [name], MD   5 mg at 03/02/23 0811   paliperidone (INVEGA SUSTENNA) injection 156 mg  156 mg Intramuscular Q28 days [name], MD   156 mg at 02/12/23 1010   pantoprazole (PROTONIX) EC tablet 80 mg  80 mg Oral Daily [name], DO   80 mg at 03/02/23 2956   topiramate (TOPAMAX) tablet 25 mg  25 mg Oral BH-q8a4p [name], DO   25 mg at 03/02/23 2130   traZODone (DESYREL) tablet 50 mg  50 mg Oral QHS PRN [name], DO   50 mg at 03/02/23 0031   zonisamide (ZONEGRAN) capsule 100 mg  100 mg Oral QHS [name], DO   100 mg at 03/01/23 2126    Lab Results: No results found for this or any previous visit (from the past 48 hour(s)).  Blood Alcohol level:  Lab Results  Component Value Date   [facility] <10 08/17/2022   ETH <10 01/11/2021    Metabolic Disorder Labs: Lab Results  Component Value Date   HGBA1C  11.0 (H) 01/27/2023   MPG 269 01/27/2023   No results found for: "PROLACTIN" Lab Results  Component Value Date   CHOL  259 (H) 01/27/2023   TRIG 155 (H) 01/27/2023   HDL 65 01/27/2023   CHOLHDL 4.0 01/27/2023   VLDL 31 01/27/2023   LDLCALC 163 (H) 01/27/2023    Physical Findings: AIMS:  , ,  ,  ,    CIWA:    COWS:     Musculoskeletal: Strength & Muscle Tone: within normal limits Gait & Station: normal Patient leans: N/A  Psychiatric Specialty Exam:  Presentation  General Appearance:  Casual; Neat  Eye Contact: Fair  Speech: Slow; Slurred  Speech Volume: Decreased  Handedness: Right   Mood and Affect  Mood: Anxious  Affect: Inappropriate   Thought Process  Thought Processes: Disorganized  Descriptions of Associations:Loose  Orientation:Partial  Thought Content:Illogical; Rumination  History of Schizophrenia/Schizoaffective disorder:No data recorded Duration of Psychotic Symptoms:No data recorded Hallucinations:No data recorded Ideas of Reference:Paranoia  Suicidal Thoughts:No data recorded Homicidal Thoughts:No data recorded  Sensorium  Memory: Immediate Fair; Remote Poor  Judgment: Poor  Insight: Poor   Executive Functions  Concentration: Poor  Attention Span: Fair  Recall: [employer] of Knowledge: Fair  Language: Fair   Psychomotor Activity  Psychomotor Activity:No data recorded  Assets  Assets: Communication Skills   Sleep  Sleep:No data recorded  MENTAL STATUS EXAM: Patient is alert and oriented x 3, pleasant and cooperative, good eye contact, speech is normal and not pressured, mood is depressed; affect is flat; thought process: goal directed; thought content: No suicidal ideation; judgment is poor, insight is poor.   Blood pressure 107/75, pulse 75, temperature (!) 97.3 F (36.3 C), resp. rate 16, height 5\' 6"  (1.676 m), weight 86 kg, SpO2 99%. Body mass index is 30.59 kg/m.   Treatment Plan  Summary: Daily contact with patient to assess and evaluate symptoms and progress in treatment, Medication management, and Plan continue current medication.  [name], DO 03/02/2023, 12:06 PM

## 2023-03-02 NOTE — Progress Notes (Signed)
   03/02/23 0615  15 Minute Checks  Location Bedroom  Visual Appearance Calm  Behavior Sleeping  Sleep (Behavioral Health Patients Only)  Calculate sleep? (Click Yes once per 24 hr at 0600 safety check) Yes  Documented sleep last 24 hours 6.25

## 2023-03-02 NOTE — Progress Notes (Signed)
 Patient pleasant and cooperative.  Flat affect.  Poor sleep - PRN medication given for sleep last night. Denies SI/HI.  Denies anxiety and depression.    Compliant with scheduled medications. 15 min checks in place for safety.  Continues to isolate to room with exception of meals, but did participate in MHT group.  Appropriate, but minimal interaction with peers.   1 episode of urine incontinence.  Patient given new scrubs pants and brief.

## 2023-03-02 NOTE — Group Note (Signed)
Date:  03/03/2023 Time:  12:48 AM  Group Topic/Focus:  Wrap-Up Group:   The focus of this group is to help patients review their daily goal of treatment and discuss progress on daily workbooks.    Participation Level:  Did Not Attend  Participation Quality:      Affect:      Cognitive:      Insight: None  Engagement in Group:      Modes of Intervention:      Additional Comments:    [name] 03/03/2023, 12:48 AM

## 2023-03-03 DIAGNOSIS — F250 Schizoaffective disorder, bipolar type: Secondary | ICD-10-CM | POA: Diagnosis not present

## 2023-03-03 LAB — BASIC METABOLIC PANEL
Anion gap: 10 (ref 5–15)
BUN: 14 mg/dL (ref 8–23)
CO2: 20 mmol/L — ABNORMAL LOW (ref 22–32)
Calcium: 9.3 mg/dL (ref 8.9–10.3)
Chloride: 109 mmol/L (ref 98–111)
Creatinine, Ser: 0.94 mg/dL (ref 0.44–1.00)
GFR, Estimated: >60 mL/min (ref >60)
Glucose, Bld: 101 mg/dL — ABNORMAL HIGH (ref 70–99)
Potassium: 4 mmol/L (ref 3.5–5.1)
Sodium: 139 mmol/L (ref 135–145)

## 2023-03-03 NOTE — Progress Notes (Signed)
Patient is a voluntary admission to [facility] for SAD - bipolar disorder. Currently awaiting placement and for APS guardianship. She is calm, cooperative, friendly with staff and peers, engaging in group sessions and meals.  Patient uses a front wheel walker to ambulate and has urinary urgency.  Wears a depends for occasional incontinence of bladder but is continent of bowel. Denies SI, HI, AVH, anxiety and depression.  Will continue to monitor.

## 2023-03-03 NOTE — Plan of Care (Signed)
  Problem: Nutrition: Goal: Adequate nutrition will be maintained Outcome: Progressing   Problem: Coping: Goal: Level of anxiety will decrease Outcome: Progressing  Patient compliant with treatment plan denies SI/HI/VH endorses AH and verbally contracts for safety. No medications adverse effects noted. Ambulating with front wheel walker all fall protocol in place. Support and encouragement provided.

## 2023-03-03 NOTE — Group Note (Signed)
Recreation Therapy Group Note   Group Topic:Relaxation  Group Date: 03/03/2023 Start Time: 1400 End Time: 1445 Facilitators: Rosina Lowenstein, LRT, CTRS Location: Dayroom  Group Description: PMR (Progressive Muscle Relaxation). LRT educates patients on what PMR is and the benefits that come from it. Patients are asked to sit with their feet flat on the floor while sitting up and all the way back in their chair, if possible. LRT and pts follow a prompt through the TV speaker that requires you to tense and release different muscles in their body and focus on their breathing. During session, lights are off and soft music is being played. Pts are given a stress ball to use if needed. LRT and pts process how this can be used as a coping skill post-discharge.   Goal Area(s) Addressed:  Patients will be able to describe progressive muscle relaxation.  Patient will practice using relaxation technique. Patient will identify a new coping skill.  Patient will follow multistep directions to reduce anxiety and stress.     Affect/Mood: N/A   Participation Level: Did not attend    Clinical Observations/Individualized Feedback: Betty Henderson did not attend group.   Plan: Continue to engage patient in RT group sessions 2-3x/week.   Rosina Lowenstein, LRT, CTRS 03/03/2023 3:47 PM

## 2023-03-03 NOTE — Progress Notes (Signed)
   03/02/23 2200  Psych Admission Type (Psych Patients Only)  Admission Status Voluntary  Psychosocial Assessment  Patient Complaints Insomnia  Eye Contact Fair  Facial Expression Flat  Affect Appropriate to circumstance  Speech Soft  Interaction Minimal  Motor Activity Slow;Shuffling  Appearance/Hygiene Unremarkable  Behavior Characteristics Cooperative;Appropriate to situation  Mood Pleasant  Thought Process  Coherency WDL  Content WDL  Delusions None reported or observed  Perception Hallucinations  Hallucination Auditory  Judgment Impaired  Confusion None  Danger to Self  Current suicidal ideation? Denies  Agreement Not to Harm Self Yes  Description of Agreement verbal  Danger to Others  Danger to Others None reported or observed  Danger to Others Abnormal  Harmful Behavior to others No threats or harm toward other people  Destructive Behavior No threats or harm toward property

## 2023-03-03 NOTE — Plan of Care (Signed)

## 2023-03-03 NOTE — Group Note (Signed)
Date:  03/03/2023 Time:  10:34 PM  Group Topic/Focus:  Wrap-Up Group:   The focus of this group is to help patients review their daily goal of treatment and discuss progress on daily workbooks.    Participation Level:  Active  Participation Quality:  Appropriate  Affect:  Appropriate  Cognitive:  Appropriate  Insight: Appropriate  Engagement in Group:  Engaged  Modes of Intervention:  Discussion  Additional Comments:    Betty Henderson 03/03/2023, 10:34 PM

## 2023-03-03 NOTE — Progress Notes (Signed)
[facility] MD Progress Note  Betty Henderson  MRN:  [id number]  Subjective: Case discussed in multidisciplinary meeting today, chart reviewed, patient seen today during rounds.  Social worker informed that patient has been assigned a guardian via court, the final/official  court hearing is on Jan 14th, 2025  Chart reviewed, case discussed in multidisciplinary team today, patient seen during rounds.  Today during assessment patient reported that she is doing "good"  Patient reports her sleep and appetite is good.  Patient reports she has attended 1 or 2 groups yesterday. No new acute issues overnight. Patient denies auditory or visual hallucinations.    Principal Problem: Schizoaffective disorder, bipolar type (HCC) Diagnosis: Principal Problem:   Schizoaffective disorder, bipolar type (HCC)   Past Psychiatric History: Schizoaffective disorder, bipolar type.  Past Medical History:  Past Medical History:  Diagnosis Date   Anemia    Arthritis    Chronic pain    Drug-seeking behavior    Malingering    Osteopetrosis    Psychosis (HCC)    Schizoaffective disorder, bipolar type (HCC)     Past Surgical History:  Procedure Laterality Date   MOUTH SURGERY     TUBAL LIGATION     Family History:  Family History  Family history unknown: Yes   Family Psychiatric  History: Unremarkable Social History:  Social History   Substance and Sexual Activity  Alcohol Use Not Currently   Comment: 1 cocktail 3 weeks ago     Social History   Substance and Sexual Activity  Drug Use Not Currently   Types: Cocaine   Comment: states "it's legal though"    Social History   Socioeconomic History   Marital status: Single    Spouse name: Not on file   Number of children: Not on file   Years of education: Not on file   Highest education level: Not on file  Occupational History   Not on file  Tobacco Use   Smoking status: Every Day    Current packs/day: 0.50    Types: Cigarettes   Smokeless  tobacco: Never  Vaping Use   Vaping status: Never Used  Substance and Sexual Activity   Alcohol use: Not Currently    Comment: 1 cocktail 3 weeks ago   Drug use: Not Currently    Types: Cocaine    Comment: states "it's legal though"   Sexual activity: Never  Other Topics Concern   Not on file  Social History Narrative   Not on file   Social Determinants of Health   Financial Resource Strain: Not on file  Food Insecurity: Food Insecurity Present (08/22/2022)   Hunger Vital Sign    Worried About Running Out of Food in the Last Year: Sometimes true    Ran Out of Food in the Last Year: Sometimes true  Transportation Needs: Patient Unable To Answer (08/22/2022)   PRAPARE - Transportation    Lack of Transportation (Medical): Patient unable to answer    Lack of Transportation (Non-Medical): Patient unable to answer  Recent Concern: Transportation Needs - Unmet Transportation Needs (06/13/2022)   Received from [facility], [facility]   [facility] - Transportation    Lack of Transportation (Medical): Not on file    Lack of Transportation (Non-Medical): Yes  Physical Activity: Not on file  Stress: Not on file  Social Connections: Unknown (08/06/2021)   Received from [facility], [facility]   Social Network    Social Network: Not on file   Additional Social History:  Patient is homeless at this time.  Social worker is working with DSS on [employer] application and  placement.                    Sleep Good  Appetite:  Good  Current Medications: Current Facility-Administered Medications  Medication Dose Route Frequency Provider Last Rate Last Admin   acetaminophen (TYLENOL) tablet 650 mg  650 mg Oral Q6H PRN [name], DO   650 mg at 02/26/23 0902   alum & mag hydroxide-simeth (MAALOX/MYLANTA) 200-200-20 MG/5ML suspension 30 mL  30 mL Oral Q4H PRN [name], DO   30 mL at 02/28/23 0153   aspirin EC tablet 81 mg  81 mg Oral Daily [name], MD   81 mg at 03/03/23 0831   atorvastatin (LIPITOR) tablet 20 mg  20 mg Oral Daily [name], MD   20 mg at 03/03/23 0831   diphenhydrAMINE (BENADRYL) capsule 50 mg  50 mg Oral Q6H PRN [name], DO   50 mg at 09/29/22 2254   Or   diphenhydrAMINE (BENADRYL) injection 50 mg  50 mg Intramuscular Q6H PRN [name], DO       glimepiride (AMARYL) tablet 8 mg  8 mg Oral Q breakfast [name], MD   8 mg at 03/03/23 0830   haloperidol (HALDOL) tablet 5 mg  5 mg Oral Q6H PRN [name], DO   5 mg at 09/28/22 2149   Or   haloperidol lactate (HALDOL) injection 5 mg  5 mg Intramuscular Q6H PRN [name], DO       ibuprofen (ADVIL) tablet 600 mg  600 mg Oral Q6H PRN [name], [name], MD   600 mg at 02/25/23 1610   linagliptin (TRADJENTA) tablet 5 mg  5 mg Oral Daily [name], MD   5 mg at 03/03/23 0831   LORazepam (ATIVAN) tablet 1 mg  1 mg Oral TID PRN [name], DO   1 mg at 02/24/23 2139   magnesium hydroxide (MILK OF MAGNESIA) suspension 30 mL  30 mL Oral Daily PRN [name], DO   30 mL at 02/27/23 2147   metFORMIN (GLUCOPHAGE) tablet 1,000 mg  1,000 mg Oral BH-q8a4p [name], DO   1,000 mg at 03/03/23 0832   ondansetron (ZOFRAN) tablet 4 mg  4 mg Oral Q4H PRN [name], MD   4 mg at 02/27/23 2016   oxybutynin (DITROPAN) tablet 5 mg  5 mg Oral BID [name], MD   5 mg at 03/03/23 0833   paliperidone (INVEGA SUSTENNA) injection 156 mg  156 mg Intramuscular Q28 days [name], MD   156 mg at 02/12/23 1010   pantoprazole (PROTONIX) EC tablet 80 mg  80 mg Oral Daily [name], DO   80 mg at 03/03/23 9604   topiramate (TOPAMAX) tablet 25 mg  25 mg Oral BH-q8a4p [name], DO   25 mg at 03/03/23 5409   traZODone (DESYREL) tablet 50 mg  50 mg Oral QHS PRN [name], DO   50 mg at 03/02/23 2051   zonisamide (ZONEGRAN) capsule 100  mg  100 mg Oral QHS [name], DO   100 mg at 03/02/23 2050    Lab Results:  No results found for this or any previous visit (from the past 48 hour(s)).    Blood Alcohol level:  Lab Results  Component Value Date  ETH <10 08/17/2022   ETH <10 01/11/2021      Musculoskeletal: Strength & Muscle Tone: within normal limits Gait & Station: normal Patient leans: N/A   Psychiatric Specialty Exam:   Presentation  General Appearance:  Casual; Neat   Eye Contact: Fair   Speech: Spontaneous   Speech Volume: Normal   Handedness: Right     Mood and Affect  Mood: "Fine"   Affect: Stable and pleasant    Thought Processes: Improved, at baseline   Descriptions of Associations: Intact   Orientation: Well-oriented   Thought Content: Improved, at baseline  Hallucinations:Denies  Ideas of Reference:None noted   Suicidal Thoughts:Denies SI, no intention or plan  Homicidal Thoughts:Denies HI, no intention or plan   Sensorium  Memory: Immediate Fair; Remote Poor   Judgment: Fair, pt is Rx compliant   Insight: Improved     Executive Functions  Concentration: Improved   Attention Span: Improved   Language: Fair     Psychomotor Activity  Psychomotor Activity: Normal  Assets  Assets: Communication Skills     Sleep  Sleep:Improved     Physical Exam: Physical Exam Vitals and nursing note reviewed.  Constitutional:      Appearance: Normal appearance. She is normal weight.  Neurological:     General: No focal deficit present.     Mental Status: She is alert.      Review of Systems  Constitutional: Negative.   HENT: Negative.    Eyes: Negative.   Respiratory: Negative.    Cardiovascular: Negative.   Gastrointestinal: Negative.   Genitourinary: Negative.   Musculoskeletal: Negative.   Skin: Negative.   Neurological: Negative.   Endo/Heme/Allergies: Negative.     Blood pressure 116/69, pulse 63, temperature 97.9 F  (36.6 C), resp. rate 16, height 5\' 6"  (1.676 m), weight 86 kg, SpO2 100%. Body mass index is 30.59 kg/m.   Treatment Plan Summary: Daily contact with patient to assess and evaluate symptoms and progress in treatment, Medication management, and Plan continue current medications.   Continue to monitor patient on as needed meds   Patient received  dose of Invega Sustenna 156 mg IM on  02/12/23 Oxybutynin 5 mg by mouth twice daily to help with urinary incontinence Continue on Metformin and Amaryl.   Continue on atorvastatin for Mixed hyperlipidemia    Patient is awaiting placement    [name], MD

## 2023-03-04 DIAGNOSIS — F250 Schizoaffective disorder, bipolar type: Secondary | ICD-10-CM | POA: Diagnosis not present

## 2023-03-04 NOTE — Group Note (Signed)
Date:  03/04/2023 Time:  9:20 PM  Group Topic/Focus:  Making Healthy Choices:   The focus of this group is to help patients identify negative/unhealthy choices they were using prior to admission and identify positive/healthier coping strategies to replace them upon discharge.    Participation Level:  Active  Participation Quality:  Appropriate  Affect:  Appropriate  Cognitive:  Appropriate  Insight: Appropriate  Engagement in Group:  Engaged  Modes of Intervention:  Education  Additional Comments:    [name] 03/04/2023, 9:20 PM

## 2023-03-04 NOTE — Progress Notes (Signed)
[facility] MD Progress Note  Betty Henderson  MRN:  [id number]  Subjective: Case discussed in multidisciplinary meeting today, chart reviewed, patient seen today during rounds.  Social worker informed that patient has been assigned a guardian via court, the final/official  court hearing is on Jan 14th, 2025  Chart reviewed, case discussed in multidisciplinary team today, patient seen during rounds.  Patient reports she is doing good.  She reports she slept good last night.  Her appetite is fine.  Patient claims she has attended few groups between yesterday and today.  Patient was encouraged to keep attending groups.  Patient denies thoughts of harming herself or others.  She denies psychotic symptoms  Principal Problem: Schizoaffective disorder, bipolar type (HCC) Diagnosis: Principal Problem:   Schizoaffective disorder, bipolar type (HCC)   Past Psychiatric History: Schizoaffective disorder, bipolar type.  Past Medical History:  Past Medical History:  Diagnosis Date   Anemia    Arthritis    Chronic pain    Drug-seeking behavior    Malingering    Osteopetrosis    Psychosis (HCC)    Schizoaffective disorder, bipolar type (HCC)     Past Surgical History:  Procedure Laterality Date   MOUTH SURGERY     TUBAL LIGATION     Family History:  Family History  Family history unknown: Yes   Family Psychiatric  History: Unremarkable Social History:  Social History   Substance and Sexual Activity  Alcohol Use Not Currently   Comment: 1 cocktail 3 weeks ago     Social History   Substance and Sexual Activity  Drug Use Not Currently   Types: Cocaine   Comment: states "it's legal though"    Social History   Socioeconomic History   Marital status: Single    Spouse name: Not on file   Number of children: Not on file   Years of education: Not on file   Highest education level: Not on file  Occupational History   Not on file  Tobacco Use   Smoking status: Every Day    Current  packs/day: 0.50    Types: Cigarettes   Smokeless tobacco: Never  Vaping Use   Vaping status: Never Used  Substance and Sexual Activity   Alcohol use: Not Currently    Comment: 1 cocktail 3 weeks ago   Drug use: Not Currently    Types: Cocaine    Comment: states "it's legal though"   Sexual activity: Never  Other Topics Concern   Not on file  Social History Narrative   Not on file   Social Determinants of Health   Financial Resource Strain: Not on file  Food Insecurity: Food Insecurity Present (08/22/2022)   Hunger Vital Sign    Worried About Running Out of Food in the Last Year: Sometimes true    Ran Out of Food in the Last Year: Sometimes true  Transportation Needs: Patient Unable To Answer (08/22/2022)   PRAPARE - Transportation    Lack of Transportation (Medical): Patient unable to answer    Lack of Transportation (Non-Medical): Patient unable to answer  Recent Concern: Transportation Needs - Unmet Transportation Needs (06/13/2022)   [facility], [facility]   [facility] - Transportation    Lack of Transportation (Medical): Not on file    Lack of Transportation (Non-Medical): Yes  Physical Activity: Not on file  Stress: Not on file  Social Connections: Unknown (08/06/2021)   Received from [facility], [facility]   Social Network    Social  Network: Not on file   Additional Social History:     Patient is homeless at this time.  Social worker is working with DSS on [employer] application and  placement.                    Sleep Good  Appetite:  Good  Current Medications: Current Facility-Administered Medications  Medication Dose Route Frequency Provider Last Rate Last Admin   acetaminophen (TYLENOL) tablet 650 mg  650 mg Oral Q6H PRN [name], DO   650 mg at 02/26/23 0902   alum & mag hydroxide-simeth (MAALOX/MYLANTA) 200-200-20 MG/5ML suspension 30 mL  30 mL Oral Q4H PRN [name], DO   30 mL at 02/28/23 0153    aspirin EC tablet 81 mg  81 mg Oral Daily [name], MD   81 mg at 03/04/23 0950   atorvastatin (LIPITOR) tablet 20 mg  20 mg Oral Daily [name], MD   20 mg at 03/04/23 0954   diphenhydrAMINE (BENADRYL) capsule 50 mg  50 mg Oral Q6H PRN [name], DO   50 mg at 09/29/22 2254   Or   diphenhydrAMINE (BENADRYL) injection 50 mg  50 mg Intramuscular Q6H PRN [name], DO       glimepiride (AMARYL) tablet 8 mg  8 mg Oral Q breakfast [name], MD   8 mg at 03/04/23 7253   haloperidol (HALDOL) tablet 5 mg  5 mg Oral Q6H PRN [name], DO   5 mg at 09/28/22 2149   Or   haloperidol lactate (HALDOL) injection 5 mg  5 mg Intramuscular Q6H PRN [name], DO       ibuprofen (ADVIL) tablet 600 mg  600 mg Oral Q6H PRN [name], [name], MD   600 mg at 02/25/23 6644   linagliptin (TRADJENTA) tablet 5 mg  5 mg Oral Daily [name], MD   5 mg at 03/04/23 0347   LORazepam (ATIVAN) tablet 1 mg  1 mg Oral TID PRN [name], DO   1 mg at 02/24/23 2139   magnesium hydroxide (MILK OF MAGNESIA) suspension 30 mL  30 mL Oral Daily PRN [name], DO   30 mL at 03/04/23 1149   metFORMIN (GLUCOPHAGE) tablet 1,000 mg  1,000 mg Oral BH-q8a4p [name], DO   1,000 mg at 03/04/23 0951   ondansetron (ZOFRAN) tablet 4 mg  4 mg Oral Q4H PRN [name], MD   4 mg at 02/27/23 2016   oxybutynin (DITROPAN) tablet 5 mg  5 mg Oral BID [name], MD   5 mg at 03/04/23 0954   paliperidone (INVEGA SUSTENNA) injection 156 mg  156 mg Intramuscular Q28 days [name], MD   156 mg at 02/12/23 1010   pantoprazole (PROTONIX) EC tablet 80 mg  80 mg Oral Daily [name], DO   80 mg at 03/04/23 4259   topiramate (TOPAMAX) tablet 25 mg  25 mg Oral BH-q8a4p [name], DO   25 mg at 03/04/23 5638   traZODone (DESYREL) tablet 50 mg  50 mg Oral QHS PRN [name], DO   50 mg at  03/03/23 2132   zonisamide (ZONEGRAN) capsule 100 mg  100 mg Oral QHS [name], DO   100 mg at 03/03/23 2131    Lab Results:  Results for orders placed or performed during the hospital encounter of 08/22/22 (from the past  48 hour(s))  Basic metabolic panel     Status: Abnormal   Collection Time: 03/03/23  2:37 PM  Result Value Ref Range   Sodium 139 135 - 145 mmol/L   Potassium 4.0 3.5 - 5.1 mmol/L   Chloride 109 98 - 111 mmol/L   CO2 20 (L) 22 - 32 mmol/L   Glucose, Bld 101 (H) 70 - 99 mg/dL    Comment: Glucose reference range applies only to samples taken after fasting for at least 8 hours.   BUN 14 8 - 23 mg/dL   Creatinine, Ser 4.09 0.44 - 1.00 mg/dL   Calcium 9.3 8.9 - 81.1 mg/dL   GFR, Estimated >91 >47 mL/min    Comment: (NOTE) Calculated using the CKD-EPI Creatinine Equation (2021)    Anion gap 10 5 - 15    Comment: Performed at [facility], [address] [place], [place] [address]      Blood Alcohol level:  Lab Results  Component Value Date   [facility] <10 08/17/2022   ETH <10 01/11/2021      Musculoskeletal: Strength & Muscle Tone: within normal limits Gait & Station: normal Patient leans: N/A   Psychiatric Specialty Exam:   Presentation  General Appearance:  Casual; Neat   Eye Contact: Fair   Speech: Spontaneous   Speech Volume: Normal   Handedness: Right     Mood and Affect  Mood: "Fine"   Affect: Stable and pleasant    Thought Processes: Improved, at baseline   Descriptions of Associations: Intact   Orientation: Well-oriented   Thought Content: Improved, at baseline  Hallucinations:Denies  Ideas of Reference:None noted   Suicidal Thoughts:Denies SI, no intention or plan  Homicidal Thoughts:Denies HI, no intention or plan   Sensorium  Memory: Immediate Fair; Remote Poor   Judgment: Fair, pt is Rx compliant   Insight: Improved     Executive Functions  Concentration: Improved    Attention Span: Improved   Language: Fair     Psychomotor Activity  Psychomotor Activity: Normal  Assets  Assets: Communication Skills     Sleep  Sleep:Improved     Physical Exam: Physical Exam Vitals and nursing note reviewed.  Constitutional:      Appearance: Normal appearance. She is normal weight.  Neurological:     General: No focal deficit present.     Mental Status: She is alert.      Review of Systems  Constitutional: Negative.   HENT: Negative.    Eyes: Negative.   Respiratory: Negative.    Cardiovascular: Negative.   Gastrointestinal: Negative.   Genitourinary: Negative.   Musculoskeletal: Negative.   Skin: Negative.   Neurological: Negative.   Endo/Heme/Allergies: Negative.     Blood pressure (!) 120/51, pulse 62, temperature (!) 97.5 F (36.4 C), resp. rate 16, height 5\' 6"  (1.676 m), weight 86 kg, SpO2 95%. Body mass index is 30.59 kg/m.   Treatment Plan Summary: Daily contact with patient to assess and evaluate symptoms and progress in treatment, Medication management, and Plan continue current medications.   Continue to monitor patient on as needed meds   Patient received  dose of Invega Sustenna 156 mg IM on  02/12/23 Oxybutynin 5 mg by mouth twice daily to help with urinary incontinence Continue on Metformin and Amaryl.   Continue on atorvastatin for Mixed hyperlipidemia    Patient is awaiting placement    [name], MD

## 2023-03-04 NOTE — Group Note (Signed)
Recreation Therapy Group Note   Group Topic:Healthy Support Systems  Group Date: 03/04/2023 Start Time: 1100 End Time: 1150 Facilitators: Rosina Lowenstein, LRT, CTRS Location:  Dayroom  Group Description: Straw Bridge. In groups or individually, patients were given 10 plastic drinking straws and an equal length of masking tape. Using the materials provided, patients were instructed to build a free-standing bridge-like structure to suspend an everyday item (ex: deck of cards) off the floor or table surface. All materials were required to be used in Secondary school teacher. LRT facilitated post-activity discussion reviewing the importance of having strong and healthy support systems in our lives. LRT discussed how the people in our lives serve as the tape and the deck of cards we placed on top of our straw structure are the stressors we face in daily life. LRT and pts discussed what happens in our life when things get too heavy for [place], and we don't have strong supports outside of the hospital. Pt shared 2 of their healthy supports in their life aloud in the group.   Goal Area(s) Addressed:  Patient will identify 2 healthy supports in their life. Patient will identify skills to successfully complete activity. Patient will identify correlation of this activity to life post-discharge.  Patient will build on frustration tolerance skills. Patient will increase team building and communication skills.    Affect/Mood: Appropriate   Participation Level: Minimal    Clinical Observations/Individualized Feedback: Monzerat was present for half of group. Pt was onlooking while in group. Pt got up and left, pt did not return.   Plan: Continue to engage patient in RT group sessions 2-3x/week.   Rosina Lowenstein, LRT, CTRS 03/04/2023 1:38 PM

## 2023-03-04 NOTE — Group Note (Signed)
Recreation Therapy Group Note   Group Topic:Other  Group Date: 03/04/2023 Start Time: 1500 End Time: 1540 Facilitators: Rosina Lowenstein, LRT, CTRS Location: Courtyard  Group Description: Leisure. Patients were given the opportunity to play ring toss, play corn hole, or listen to music while sitting in the courtyard getting fresh air and sunlight. Pt identified and conversated about things they enjoy doing in their free time and how they can continue to do that outside of the hospital.  Goal Area(s) Addressed: Patient will learn the definition of "leisure". Patient will practice making a positive decision. Patient will have the opportunity to try a new leisure activity. Patient will communicate with peers and LRT.   Affect/Mood: Appropriate   Participation Level: Active and Engaged   Participation Quality: Independent   Behavior: Appropriate, Calm, and Cooperative   Speech/Thought Process: Coherent   Insight: Good   Judgement: Good   Modes of Intervention: Music   Patient Response to Interventions:  Attentive, Engaged, and Receptive   Education Outcome:  Acknowledges education   Clinical Observations/Individualized Feedback: Betty Henderson was active in their participation of session activities and group discussion. Pt requested multiple different songs while outside. When trying to recall the name of songs, pt said: "my mind just goes black and I forget." Pt was singing had a bright affect while outside. Pt interacted well with LRT and peers duration of session.   Plan: Continue to engage patient in RT group sessions 2-3x/week.   Rosina Lowenstein, LRT, CTRS 03/04/2023 4:58 PM

## 2023-03-04 NOTE — Group Note (Signed)
Date:  03/04/2023 Time:  2:53 PM  Group Topic/Focus:  Movement Therapy    Participation Level:  Minimal  Participation Quality:  Drowsy  Affect:  Flat  Cognitive:  Appropriate  Insight: None  Engagement in Group:  Limited  Modes of Intervention:  Activity  Additional Comments:  none  Rodena Goldmann 03/04/2023, 2:53 PM

## 2023-03-04 NOTE — BH IP Treatment Plan (Signed)
Interdisciplinary Treatment and Diagnostic Plan Update  03/04/2023 Time of Session: 9:30 AM  Karne Tlatelpa MRN: [id number]  Principal Diagnosis: Schizoaffective disorder, bipolar type (HCC)  Secondary Diagnoses: Principal Problem:   Schizoaffective disorder, bipolar type (HCC)   Current Medications:  Current Facility-Administered Medications  Medication Dose Route Frequency Provider Last Rate Last Admin   acetaminophen (TYLENOL) tablet 650 mg  650 mg Oral Q6H PRN [name], DO   650 mg at 02/26/23 0902   alum & mag hydroxide-simeth (MAALOX/MYLANTA) 200-200-20 MG/5ML suspension 30 mL  30 mL Oral Q4H PRN [name], DO   30 mL at 02/28/23 0153   aspirin EC tablet 81 mg  81 mg Oral Daily [name], MD   81 mg at 03/04/23 0950   atorvastatin (LIPITOR) tablet 20 mg  20 mg Oral Daily [name], MD   20 mg at 03/04/23 0954   diphenhydrAMINE (BENADRYL) capsule 50 mg  50 mg Oral Q6H PRN [name], DO   50 mg at 09/29/22 2254   Or   diphenhydrAMINE (BENADRYL) injection 50 mg  50 mg Intramuscular Q6H PRN [name], DO       glimepiride (AMARYL) tablet 8 mg  8 mg Oral Q breakfast [name], MD   8 mg at 03/04/23 0951   haloperidol (HALDOL) tablet 5 mg  5 mg Oral Q6H PRN [name], DO   5 mg at 09/28/22 2149   Or   haloperidol lactate (HALDOL) injection 5 mg  5 mg Intramuscular Q6H PRN [name], DO       ibuprofen (ADVIL) tablet 600 mg  600 mg Oral Q6H PRN [name], [name], MD   600 mg at 02/25/23 0918   linagliptin (TRADJENTA) tablet 5 mg  5 mg Oral Daily [name], MD   5 mg at 03/04/23 8657   LORazepam (ATIVAN) tablet 1 mg  1 mg Oral TID PRN [name], DO   1 mg at 02/24/23 2139   magnesium hydroxide (MILK OF MAGNESIA) suspension 30 mL  30 mL Oral Daily PRN [name], DO   30 mL at 03/04/23 1149   metFORMIN (GLUCOPHAGE) tablet 1,000 mg  1,000 mg Oral BH-[id number]  [name], DO   1,000 mg at 03/04/23 0951   ondansetron (ZOFRAN) tablet 4 mg  4 mg Oral Q4H PRN [name], MD   4 mg at 02/27/23 2016   oxybutynin (DITROPAN) tablet 5 mg  5 mg Oral BID [name], MD   5 mg at 03/04/23 0954   paliperidone (INVEGA SUSTENNA) injection 156 mg  156 mg Intramuscular Q28 days [name], MD   156 mg at 02/12/23 1010   pantoprazole (PROTONIX) EC tablet 80 mg  80 mg Oral Daily [name], DO   80 mg at 03/04/23 8469   topiramate (TOPAMAX) tablet 25 mg  25 mg Oral BH-q8a4p [name], DO   25 mg at 03/04/23 6295   traZODone (DESYREL) tablet 50 mg  50 mg Oral QHS PRN [name], DO   50 mg at 03/03/23 2132   zonisamide (ZONEGRAN) capsule 100 mg  100 mg Oral QHS [name], DO   100 mg at 03/03/23 2131   PTA Medications: Medications Prior to Admission  Medication Sig Dispense Refill Last Dose   ondansetron (ZOFRAN) 4 MG tablet Take 1 tablet (4 mg total) by mouth every 8 (eight) hours as needed for nausea  or vomiting. (Patient not taking: Reported on 08/17/2022) 20 tablet 0     Patient Stressors: Financial difficulties   Health problems   Medication change or noncompliance    Patient Strengths: Ability for insight   Treatment Modalities: Medication Management, Group therapy, Case management,  1 to 1 session with clinician, Psychoeducation, Recreational therapy.   Physician Treatment Plan for Primary Diagnosis: Schizoaffective disorder, bipolar type (HCC) Long Term Goal(s): Improvement in symptoms so as ready for discharge   Short Term Goals: Ability to identify changes in lifestyle to reduce recurrence of condition will improve Ability to verbalize feelings will improve Ability to disclose and discuss suicidal ideas Ability to demonstrate self-control will improve Ability to identify and develop effective coping behaviors will improve Ability to maintain clinical measurements  within normal limits will improve Compliance with prescribed medications will improve Ability to identify triggers associated with substance abuse/mental health issues will improve  Medication Management: Evaluate patient's response, side effects, and tolerance of medication regimen.  Therapeutic Interventions: 1 to 1 sessions, Unit Group sessions and Medication administration.  Evaluation of Outcomes: Progressing  Physician Treatment Plan for Secondary Diagnosis: Principal Problem:   Schizoaffective disorder, bipolar type (HCC)  Long Term Goal(s): Improvement in symptoms so as ready for discharge   Short Term Goals: Ability to identify changes in lifestyle to reduce recurrence of condition will improve Ability to verbalize feelings will improve Ability to disclose and discuss suicidal ideas Ability to demonstrate self-control will improve Ability to identify and develop effective coping behaviors will improve Ability to maintain clinical measurements within normal limits will improve Compliance with prescribed medications will improve Ability to identify triggers associated with substance abuse/mental health issues will improve     Medication Management: Evaluate patient's response, side effects, and tolerance of medication regimen.  Therapeutic Interventions: 1 to 1 sessions, Unit Group sessions and Medication administration.  Evaluation of Outcomes: Progressing   RN Treatment Plan for Primary Diagnosis: Schizoaffective disorder, bipolar type (HCC) Long Term Goal(s): Knowledge of disease and therapeutic regimen to maintain health will improve  Short Term Goals: Ability to remain free from injury will improve, Ability to verbalize frustration and anger appropriately will improve, Ability to demonstrate self-control, Ability to participate in decision making will improve, Ability to verbalize feelings will improve, Ability to disclose and discuss suicidal ideas, Ability to identify  and develop effective coping behaviors will improve, and Compliance with prescribed medications will improve  Medication Management: RN will administer medications as ordered by provider, will assess and evaluate patient's response and provide education to patient for prescribed medication. RN will report any adverse and/or side effects to prescribing provider.  Therapeutic Interventions: 1 on 1 counseling sessions, Psychoeducation, Medication administration, Evaluate responses to treatment, Monitor vital signs and CBGs as ordered, Perform/monitor CIWA, COWS, AIMS and Fall Risk screenings as ordered, Perform wound care treatments as ordered.  Evaluation of Outcomes: Progressing   LCSW Treatment Plan for Primary Diagnosis: Schizoaffective disorder, bipolar type (HCC) Long Term Goal(s): Safe transition to appropriate next level of care at discharge, Engage patient in therapeutic group addressing interpersonal concerns.  Short Term Goals: Engage patient in aftercare planning with referrals and resources, Increase social support, Increase ability to appropriately verbalize feelings, Increase emotional regulation, Facilitate acceptance of mental health diagnosis and concerns, Facilitate patient progression through stages of change regarding substance use diagnoses and concerns, Identify triggers associated with mental health/substance abuse issues, and Increase skills for wellness and recovery  Therapeutic Interventions: Assess for all discharge needs, 1 to  1 time with Child psychotherapist, Explore available resources and support systems, Assess for adequacy in community support network, Educate family and significant other(s) on suicide prevention, Complete Psychosocial Assessment, Interpersonal group therapy.  Evaluation of Outcomes: Progressing   Progress in Treatment: Attending groups: Yes. and No. Participating in groups: Yes. and No. Taking medication as prescribed: Yes. Toleration medication:  Yes. Family/Significant other contact made: Yes, individual(s) contacted:  Pt son Nyarie Fochtman, [phone]. Patient understands diagnosis: No. Discussing patient identified problems/goals with staff: Yes. Medical problems stabilized or resolved: No. Denies suicidal/homicidal ideation: Yes. Issues/concerns per patient self-inventory: No. Other:    New problem(s) identified: No, Describe:  none Updates 12/12/22: No changes made at this time Updates 12/17/22: No changes made at this time Update 12/24/22 No changes at this time. Update 12/29/2022: No changes at this time. Update 01/03/23: No changes at this time Update 01/08/23: No changes at this time Update 01/13/23: No changes at this time Update 01/18/23: None at this time. Update 01/23/2023:  No changes at this time. Update 01/28/23: Pt recently diagnosed with Diabetes Type II  Update 02/02/23: None at this time. Update 02/07/23: No changes at this time Update 02/12/23: No changes at this time Update 02/17/23: No changes at this time. Update: 02/22/2023: No changes at this time. Update 02/27/23: No changes at this time Update 03/04/23: No changes at this time      New Short Term/Long Term Goal(s): Update 6/30: none at this time. Update 09/27/2022:  No changes at this time.  Update 10/02/2022:  No changes at this time. Update 10/07/22: No changes at this time 10/12/22: No changes at this time Update 10/18/22: No changes at this time Update 10/23/22: No changes at this time Update 10/28/22: No changes at this time Update 11/07/22: No changes at this time Update 11/12/22: No changes at this time  Update 11/17/22: None at this time. Update 11/22/22: None at this time. Update 11/27/22 No changes at this time. Update 12/02/22 No changes at this time  Update 12/07/22: None at this time. Updates 12/12/22: No changes made at this time Updates 12/17/22: No changes made at this time Update 12/24/22 No changes at this time. Update 12/29/2022: No changes at this time. Update 01/03/23:  No changes at this time Update 01/08/23: No changes at this time Update 01/13/23: No changes at this time   Update 01/18/23: No changes at this time. Update 01/23/2023:  No changes at this time. Update 01/28/2023:  No changes at this time. Update 02/02/23: None at this time.  Update 02/07/23: No changes at this time Update 02/12/23: No changes at this time Update 02/17/23: No changes at this time. Update: 02/22/2023: No changes at this time. Update 02/27/23: No changes at this time Update 03/04/23: No changes at this time            Patient Goals:  Update 6/30: none at this time. Update 09/27/2022:  No changes at this time. Update 10/02/2022:  No changes at this time. Update 10/07/22: No changes at this time 10/12/22: No changes at this time Update 10/18/22: No changes at this time Update 10/23/22: No changes at this time Update 10/28/22: No changes at this time Update 11/07/22: No changes at this time Update 11/12/22: No changes at this time Update 11/17/22: None at this time. Update 11/22/22: None at this time. Update 11/27/22 No changes at this time Update 12/02/22 No changes at this time   Update 12/07/22: None at this time. Updates 12/12/22: No changes made at  this time Updates 12/17/22: No changes made at this time Update 12/24/22 No changes at this time. Update 12/29/2022: No changes at this time. Update 01/03/23: No changes at this time Update 01/08/23: No changes at this time Update 01/13/23: No changes at this time   Update 01/18/23: No changes at this time. Update 01/23/2023:  No changes at this time.  Update 01/28/2023:  No changes at this time. Update 02/02/23: None at this time.  Update 02/07/23: No changes at this time Update 02/12/23: No changes at this time Update 02/17/23: No changes at this time. Update: 02/22/2023: No changes at this time. Update 02/27/23: No changes at this time Update 03/04/23: No changes at this time            Discharge Plan or Barriers: Update 6/30: APS report has been made and patient  is being investigated for guardianship needs.  Remains homeless with limited supports. Update 09/27/2022:  No changes at this time.  Update 10/02/2022:  Patient remains safe on the unit at this time.  Patient remains psychotic at this time.  APS report has been made, however, no follow up from the caseworker on her case.  No safe discharge identified.  CSW has requested that application for Medicaid and disability be completed.   Update 10/07/22: No changes at this time 10/12/22: No changes at this time Update 10/18/22: No changes at this time Update 10/23/22: No changes at this time Update 10/23/22: No changes at this time Update 10/28/22: According to [facility], pt's caseworker at DSS, the petition was filed for guardianship on 10/21/22, now awaiting for the petition to be approvedUpdate 11/07/22: No changes at this time Update 11/12/22: CSW sent over patients information to [facility] and [facility], awaiting a response. CSW awaiting word from DSS regarding pt's petition and what agency will become her guardian. Update 11/17/22: No changes at this time. Update 11/22/22: CSW has sent patients information to multiple facilities that were suggested by leadership, pt has been denied from the facility or the facility does not respond. CSW continues to send pt's information to nursing homes. Update 11/27/22 CSW contacted DSS to inquire about the status of guardianship. No response at this time Update 12/02/22 Supervisor Loraine Leriche contacted DSS who stated they are not taking pt on for guardianship but that Quincy Carnes will be present tomorrow to do a visit with social worker and pt and that pt's son will be signing documentation for placement for the pt. Update 12/07/22: [facility] DSS has declined to take guardianship. Son, Casimiro Needle is to step into a more present role in deciding placement. A conversation is still needed. Updates 12/12/22: CSW staffed case with leadership and they state that a family meeting must happen  with pt's son. CSW contacted pt's financial navigator and her DSS worker so that they may be present on the call and are able to speak to the efforts they have made to secure pt funding and housing. CSW waiting on responses from both, leadership is aware. Updates 12/17/22: CSW awaiting updates from both [facility] DSS and [facility] financial navigators as to status of pt's applications for Medicaid/[employer] and Disability, respectively. Update 12/24/22 CSW sent email regarding family meeting per request of supervisors. CSW awaiting responses from all parties to schedule family meeting. Update: 12/29/2022 No changes at this time. Update 01/03/23: CSW continues to await responses on the scheduling of the family meeting. CSW received call from Primitivo Gauze at [employer], CSW sent FL2 to [place] to aide  in pt's discharge planning. Update 01/08/23: CSW has reached out again to leadership and financial counseling. CSW continues to await a response. CSW called Sharlee Blew, awaiting a response for status of pt's placement options. Update 01/13/23: Pt is to meet with psychologist tomorrow 10/22 to complete capacity testing so that DSS can retry for guardianship  Update 01/18/23: The patient is still awaiting placement.  Update 01/23/2023:  Patient remains safe on the unit at this time.  CSW team continues to work on placement.  CSW team in contact with DSS to develop plan on guardianship.   Work continues to Hospital doctor. Update 01/28/2023:  Medicaid application remains pending. DSS awaiting records from medical records to retry fo guardianship.  Update 02/02/23: None at this time.  Update 02/07/23: Sharlee Blew at DSS confirms that pt records were received and that they are petitioning for guardianship, she informs CSW that decision should be made on court date by Monday or Tuesday of next week. CSW still awaits status update on Medicaid application Update 02/12/23: Medicaid Application still pending.  TOC Supervisors recommend that CSW continue searching for placement as we await DSS taking guardianship. Update 02/17/23: DSS confirms guardianship date for January 14th, 2025. CSW continue to search for placement. Update: 02/22/2023: No changes at this time.  Update 02/27/23: CSW sent pt's FL2 to multiple different placements. Awaiting to hear back from placements. Update 03/04/23: No changes at this time         Reason for Continuation of Hospitalization: Hallucinations Mania Medication stabilization   Estimated Length of Stay:  Update 6/30: 1-7 days Update 09/27/2022:  TBD Update 10/02/2022:  No changes at this time. UpdaUpdate 10/18/22: No changes at this time te 10/07/22: No changes at this time Update 10/18/22: No changes at this time Update 10/23/22: No changes at this time Update 10/28/22: No changes at this time Update 11/07/22: No changes at this time Update 11/12/22: No changes at this time  Update 11/17/22: No changes at this time. Update 11/22/22: None at this time. Update 11/27/22 No changes at this time. Update 12/02/22 No changes at this time  Update 12/07/22: None at this time. Updates 12/12/22: No changes made at this time Updates 12/17/22: TBD. Update 12/29/2022: No changes at this time. Update 01/03/23: TBDUpdate 01/08/23: TBD Update 01/13/23: TBD  Update 01/18/23: No changes at this time.  Update 01/23/2023:  TBD Update 01/28/2023:  TBD Update 02/02/23: None at this time. Update 02/07/23: TBD Update 02/12/23: TBD Update 02/17/23: TBD. Update: 02/22/2023: TBD. Update: 02/27/2023: TBD. Update: 03/04/2023: TBD.  Last 3 [place] Suicide Severity Risk Score: Flowsheet Row Admission (Current) from 08/22/2022 in [facility] from 08/17/2022 in [facility] Department at [facility] ED from 08/12/2022 in [facility] Emergency Department at [facility]  C-SSRS RISK CATEGORY Low Risk No Risk No Risk       Last [facility] 2/9 Scores:     No data to display           Scribe for Treatment Team: Laretta Alstrom 03/04/2023 12:44 PM

## 2023-03-04 NOTE — Progress Notes (Signed)
Patient is a voluntary admission to [facility] for SAD - bipolar disorder.  Patient c/o constipation today and was given prn milk of magnesia. Denies SI, HI, AVH, anxiety and depression. Is calm and cooperative with peers and staff. Engaged in group activities.  Will continue to monitor.

## 2023-03-05 DIAGNOSIS — F250 Schizoaffective disorder, bipolar type: Secondary | ICD-10-CM | POA: Diagnosis not present

## 2023-03-05 NOTE — Plan of Care (Signed)
  Problem: Elimination: Goal: Will not experience complications related to bowel motility Outcome: Progressing Goal: Will not experience complications related to urinary retention Outcome: Progressing   Problem: Pain Managment: Goal: General experience of comfort will improve Outcome: Progressing  Patient compliant with medications and treatment plan. Denies SI/HI/VH endorses AH sometimes. No medications adverse effects noted. Q 15 minutes safety checks ongoing Patient remains safe. All fall protocol in place ambulating with front wheel walker.

## 2023-03-05 NOTE — Group Note (Signed)
Recreation Therapy Group Note   Group Topic:Coping Skills  Group Date: 03/05/2023 Start Time: 1100 End Time: 1145 Facilitators: Rosina Lowenstein, LRT, CTRS Location:  Day Room  Group Description: Mind Map.  Patient was provided a blank template of a diagram with 32 blank boxes in a tiered system, branching from the center (similar to a bubble chart). LRT directed patients to label the middle of the diagram "Coping Skills". LRT and patients then came up with 8 different coping skills as examples. Pt were directed to record their coping skills in the 2nd tier boxes closest to the center.  Patients would then share their coping skills with the group as LRT wrote them out. LRT gave a handout of 99 different coping skills at the end of group.   Goal Area(s) Addressed: Patients will be able to define "coping skills". Patient will identify new coping skills.  Patient will increase communication.   Affect/Mood: Appropriate   Participation Level: Moderate    Clinical Observations/Individualized Feedback: Betty Henderson came late to group and chose to sit and follow along with peers since she has done this intervention before. Pt was calm and pleasant while in group.   Plan: Continue to engage patient in RT group sessions 2-3x/week.   Rosina Lowenstein, LRT, CTRS 03/05/2023 1:28 PM

## 2023-03-05 NOTE — Progress Notes (Signed)
   03/05/23 0747  Psych Admission Type (Psych Patients Only)  Admission Status Voluntary  Psychosocial Assessment  Patient Complaints Sadness  Eye Contact Fair  Facial Expression Sullen  Affect Appropriate to circumstance  Speech Logical/coherent  Interaction Minimal  Motor Activity Slow  Appearance/Hygiene Layered clothes  Behavior Characteristics Cooperative  Mood Pleasant  Thought Process  Coherency WDL  Content WDL  Delusions None reported or observed  Perception Hallucinations  Hallucination Auditory  Judgment Impaired  Confusion None  Danger to Self  Current suicidal ideation? Denies  Danger to Others  Danger to Others None reported or observed  Danger to Others Abnormal  Harmful Behavior to others No threats or harm toward other people

## 2023-03-05 NOTE — Group Note (Signed)
Recreation Therapy Group Note   Group Topic:Coping Skills  Group Date: 03/05/2023 Start Time: 1530 End Time: 1615 Facilitators: Clinton Gallant, CTRS Location:  Day Room  Group Description: Chair Yoga. LRT and patients discussed the benefits of yoga and how it differs from strength exercises. LRT educated patients on the mental and physical benefits of yoga and deep breathing and how it can be used as a Associate Professor. LRT and patients followed along to a guided yoga session on the television that focused on all parts of the body, as well as deep breathing. Pt encouraged to stop movement at any time if they feel discomfort or pain.   Goal Area(s) Addressed: Patient will practice using relaxation technique. Patient will identify a new coping skill.  Patient will follow multistep directions to reduce anxiety and stress.   Affect/Mood: Flat   Participation Level: Non-verbal and Minimal   Participation Quality: Independent   Behavior: On-looking   Speech/Thought Process: Coherent   Insight: Limited   Judgement: Fair    Modes of Intervention: Activity   Patient Response to Interventions:  Disengaged   Education Outcome:  In group clarification offered    Clinical Observations/Individualized Feedback: Ki was not active in their participation of session activities and group discussion. Pt did not complete any of the exercises shown. Pt did not engage in discussion. Pt did not interact with LRT or peers while in group.    Plan: Continue to engage patient in RT group sessions 2-3x/week.   Rosina Lowenstein, LRT, CTRS 03/05/2023 5:19 PM

## 2023-03-05 NOTE — Plan of Care (Signed)
  Problem: Education: Goal: Knowledge of General Education information will improve Description: Including pain rating scale, medication(s)/side effects and non-pharmacologic comfort measures Outcome: Progressing   Problem: Health Behavior/Discharge Planning: Goal: Ability to manage health-related needs will improve Outcome: Progressing   Problem: Clinical Measurements: Goal: Ability to maintain clinical measurements within normal limits will improve Outcome: Progressing Goal: Diagnostic test results will improve Outcome: Progressing   Problem: Nutrition: Goal: Adequate nutrition will be maintained Outcome: Progressing   Problem: Coping: Goal: Level of anxiety will decrease Outcome: Progressing   Problem: Elimination: Goal: Will not experience complications related to bowel motility Outcome: Progressing Goal: Will not experience complications related to urinary retention Outcome: Progressing   Problem: Pain Managment: Goal: General experience of comfort will improve Outcome: Progressing   Problem: Safety: Goal: Ability to remain free from injury will improve Outcome: Progressing   Problem: Skin Integrity: Goal: Risk for impaired skin integrity will decrease Outcome: Progressing

## 2023-03-05 NOTE — Progress Notes (Signed)
[facility] MD Progress Note  Betty Henderson  MRN:  [id number]  Subjective: Case discussed in multidisciplinary meeting today, chart reviewed, patient seen today during rounds.  Social worker informed that patient has been assigned a guardian via court, the final/official  court hearing is on Jan 14th, 2025  Chart reviewed, case discussed in multidisciplinary team today, patient seen during rounds.  Patient reports she is doing fine.  She reports she slept good last night.  Her appetite is fine.  Patient claims she has attended few groups between yesterday and today.  Patient was encouraged to keep attending groups.  Patient denies thoughts of harming herself or others.  She auditory hallucinations, although she was heard talking to self in her room.  Principal Problem: Schizoaffective disorder, bipolar type (HCC) Diagnosis: Principal Problem:   Schizoaffective disorder, bipolar type (HCC)   Past Psychiatric History: Schizoaffective disorder, bipolar type.  Past Medical History:  Past Medical History:  Diagnosis Date   Anemia    Arthritis    Chronic pain    Drug-seeking behavior    Malingering    Osteopetrosis    Psychosis (HCC)    Schizoaffective disorder, bipolar type (HCC)     Past Surgical History:  Procedure Laterality Date   MOUTH SURGERY     TUBAL LIGATION     Family History:  Family History  Family history unknown: Yes   Family Psychiatric  History: Unremarkable Social History:  Social History   Substance and Sexual Activity  Alcohol Use Not Currently   Comment: 1 cocktail 3 weeks ago     Social History   Substance and Sexual Activity  Drug Use Not Currently   Types: Cocaine   Comment: states "it's legal though"    Social History   Socioeconomic History   Marital status: Single    Spouse name: Not on file   Number of children: Not on file   Years of education: Not on file   Highest education level: Not on file  Occupational History   Not on file   Tobacco Use   Smoking status: Every Day    Current packs/day: 0.50    Types: Cigarettes   Smokeless tobacco: Never  Vaping Use   Vaping status: Never Used  Substance and Sexual Activity   Alcohol use: Not Currently    Comment: 1 cocktail 3 weeks ago   Drug use: Not Currently    Types: Cocaine    Comment: states "it's legal though"   Sexual activity: Never  Other Topics Concern   Not on file  Social History Narrative   Not on file   Social Determinants of Health   Financial Resource Strain: Not on file  Food Insecurity: Food Insecurity Present (08/22/2022)   Hunger Vital Sign    Worried About Running Out of Food in the Last Year: Sometimes true    Ran Out of Food in the Last Year: Sometimes true  Transportation Needs: Patient Unable To Answer (08/22/2022)   PRAPARE - Transportation    Lack of Transportation (Medical): Patient unable to answer    Lack of Transportation (Non-Medical): Patient unable to answer  Recent Concern: Transportation Needs - Unmet Transportation Needs (06/13/2022)   Received from [facility]   [facility] - Transportation    Lack of Transportation (Medical): Not on file    Lack of Transportation (Non-Medical): Yes  Physical Activity: Not on file  Stress: Not on file  Social Connections: Unknown (08/06/2021)   Received from [facility] [place]  [facility]   Social Network    Social Network: Not on file   Additional Social History:     Patient is homeless at this time.  Social worker is working with DSS on [employer] application and  placement.                    Sleep Good  Appetite:  Good  Current Medications: Current Facility-Administered Medications  Medication Dose Route Frequency Provider Last Rate Last Admin   acetaminophen (TYLENOL) tablet 650 mg  650 mg Oral Q6H PRN [name], DO   650 mg at 02/26/23 0902   alum & mag hydroxide-simeth (MAALOX/MYLANTA) 200-200-20 MG/5ML suspension 30 mL  30 mL Oral Q4H PRN  [name], DO   30 mL at 02/28/23 0153   aspirin EC tablet 81 mg  81 mg Oral Daily [name], MD   81 mg at 03/05/23 0827   atorvastatin (LIPITOR) tablet 20 mg  20 mg Oral Daily [name], MD   20 mg at 03/05/23 1096   diphenhydrAMINE (BENADRYL) capsule 50 mg  50 mg Oral Q6H PRN [name], DO   50 mg at 09/29/22 2254   Or   diphenhydrAMINE (BENADRYL) injection 50 mg  50 mg Intramuscular Q6H PRN [name], DO       glimepiride (AMARYL) tablet 8 mg  8 mg Oral Q breakfast [name], MD   8 mg at 03/05/23 0830   haloperidol (HALDOL) tablet 5 mg  5 mg Oral Q6H PRN [name], DO   5 mg at 09/28/22 2149   Or   haloperidol lactate (HALDOL) injection 5 mg  5 mg Intramuscular Q6H PRN [name], DO       ibuprofen (ADVIL) tablet 600 mg  600 mg Oral Q6H PRN [name], [name], MD   600 mg at 03/05/23 0454   linagliptin (TRADJENTA) tablet 5 mg  5 mg Oral Daily [name], MD   5 mg at 03/05/23 0827   LORazepam (ATIVAN) tablet 1 mg  1 mg Oral TID PRN [name], DO   1 mg at 02/24/23 2139   magnesium hydroxide (MILK OF MAGNESIA) suspension 30 mL  30 mL Oral Daily PRN [name], DO   30 mL at 03/04/23 1149   metFORMIN (GLUCOPHAGE) tablet 1,000 mg  1,000 mg Oral BH-q8a4p [name], DO   1,000 mg at 03/05/23 0830   ondansetron (ZOFRAN) tablet 4 mg  4 mg Oral Q4H PRN [name], MD   4 mg at 02/27/23 2016   oxybutynin (DITROPAN) tablet 5 mg  5 mg Oral BID [name], MD   5 mg at 03/05/23 0830   paliperidone (INVEGA SUSTENNA) injection 156 mg  156 mg Intramuscular Q28 days [name], MD   156 mg at 02/12/23 1010   pantoprazole (PROTONIX) EC tablet 80 mg  80 mg Oral Daily [name], DO   80 mg at 03/05/23 0981   topiramate (TOPAMAX) tablet 25 mg  25 mg Oral BH-q8a4p [name], DO   25 mg at 03/05/23 1914   traZODone (DESYREL) tablet 50 mg  50 mg  Oral QHS PRN [name], DO   50 mg at 03/04/23 2111   zonisamide (ZONEGRAN) capsule 100 mg  100 mg Oral QHS [name], DO   100 mg at 03/04/23 2112    Lab Results:  Results for orders placed or performed  during the hospital encounter of 08/22/22 (from the past 48 hour(s))  Basic metabolic panel     Status: Abnormal   Collection Time: 03/03/23  2:37 PM  Result Value Ref Range   Sodium 139 135 - 145 mmol/L   Potassium 4.0 3.5 - 5.1 mmol/L   Chloride 109 98 - 111 mmol/L   CO2 20 (L) 22 - 32 mmol/L   Glucose, Bld 101 (H) 70 - 99 mg/dL    Comment: Glucose reference range applies only to samples taken after fasting for at least 8 hours.   BUN 14 8 - 23 mg/dL   Creatinine, Ser 1.30 0.44 - 1.00 mg/dL   Calcium 9.3 8.9 - 86.5 mg/dL   GFR, Estimated >78 >46 mL/min    Comment: (NOTE) Calculated using the CKD-EPI Creatinine Equation (2021)    Anion gap 10 5 - 15    Comment: Performed at [facility], [address] [place], [place] [address]      Blood Alcohol level:  Lab Results  Component Value Date   [facility] <10 08/17/2022   ETH <10 01/11/2021      Musculoskeletal: Strength & Muscle Tone: within normal limits Gait & Station: normal Patient leans: N/A   Psychiatric Specialty Exam:   Presentation  General Appearance:  Casual; Neat   Eye Contact: Fair   Speech: Spontaneous   Speech Volume: Normal   Handedness: Right     Mood and Affect  Mood: "Fine"   Affect: Stable and pleasant    Thought Processes: Improved, at baseline   Descriptions of Associations: Intact   Orientation: Well-oriented   Thought Content: Improved, at baseline  Hallucinations:Denies  Ideas of Reference:None noted   Suicidal Thoughts:Denies SI, no intention or plan  Homicidal Thoughts:Denies HI, no intention or plan   Sensorium  Memory: Immediate Fair; Remote Poor   Judgment: Fair, pt is Rx compliant   Insight: Improved      Executive Functions  Concentration: Improved   Attention Span: Improved   Language: Fair     Psychomotor Activity  Psychomotor Activity: Normal  Assets  Assets: Communication Skills     Sleep  Sleep:Improved     Physical Exam: Physical Exam Vitals and nursing note reviewed.  Constitutional:      Appearance: Normal appearance. She is normal weight.  Neurological:     General: No focal deficit present.     Mental Status: She is alert.      Review of Systems  Constitutional: Negative.   HENT: Negative.    Eyes: Negative.   Respiratory: Negative.    Cardiovascular: Negative.   Gastrointestinal: Negative.   Genitourinary: Negative.   Musculoskeletal: Negative.   Skin: Negative.   Neurological: Negative.   Endo/Heme/Allergies: Negative.     Blood pressure 108/70, pulse 76, temperature 97.9 F (36.6 C), resp. rate 18, height 5\' 6"  (1.676 m), weight 86 kg, SpO2 97%. Body mass index is 30.59 kg/m.   Treatment Plan Summary: Daily contact with patient to assess and evaluate symptoms and progress in treatment, Medication management, and Plan continue current medications.   Continue to monitor patient on as needed meds   Patient received  dose of Invega Sustenna 156 mg IM on  02/12/23 Oxybutynin 5 mg by mouth twice daily to help with urinary incontinence Continue on Metformin and Amaryl.   Continue on atorvastatin for Mixed hyperlipidemia    Patient is awaiting placement    [name], MD

## 2023-03-05 NOTE — Group Note (Signed)
[facility] LCSW Group Therapy Note   Group Date: 03/05/2023 Start Time: 1415 End Time: 1500   Type of Therapy/Topic:  Group Therapy:  Emotion Regulation  Participation Level:  Minimal   Mood:  Description of Group:    The purpose of this group is to assist patients in learning to regulate negative emotions and experience positive emotions. Patients will be guided to discuss ways in which they have been vulnerable to their negative emotions. These vulnerabilities will be juxtaposed with experiences of positive emotions or situations, and patients challenged to use positive emotions to combat negative ones. Special emphasis will be placed on coping with negative emotions in conflict situations, and patients will process healthy conflict resolution skills.  Therapeutic Goals: Patient will identify two positive emotions or experiences to reflect on in order to balance out negative emotions:  Patient will label two or more emotions that they find the most difficult to experience:  Patient will be able to demonstrate positive conflict resolution skills through discussion or role plays:   Summary of Patient Progress: Patient was present in group.  Patient was attentive and appeared alert in group.  Patient appeared to follow along with the conversation, however, when asked to engage in discussion on coping skills patient stated that she wanted to live in her mothers home.   Therapeutic Modalities:   Cognitive Behavioral Therapy Feelings Identification Dialectical Behavioral Therapy   Harden Mo, LCSW

## 2023-03-06 DIAGNOSIS — F250 Schizoaffective disorder, bipolar type: Secondary | ICD-10-CM | POA: Diagnosis not present

## 2023-03-06 NOTE — Group Note (Signed)
Date:  03/06/2023 Time:  2:14 AM  Group Topic/Focus:  Recovery Goals:   The focus of this group is to identify appropriate goals for recovery and establish a plan to achieve them. Wrap-Up Group:   The focus of this group is to help patients review their daily goal of treatment and discuss progress on daily workbooks.    Participation Level:  Did Not Attend   Additional Comments:    Betty Henderson 03/06/2023, 2:14 AM

## 2023-03-06 NOTE — Plan of Care (Signed)
  Problem: Education: Goal: Knowledge of General Education information will improve Description: Including pain rating scale, medication(s)/side effects and non-pharmacologic comfort measures Outcome: Progressing   Problem: Health Behavior/Discharge Planning: Goal: Ability to manage health-related needs will improve Outcome: Progressing   Problem: Clinical Measurements: Goal: Ability to maintain clinical measurements within normal limits will improve Outcome: Progressing Goal: Diagnostic test results will improve Outcome: Progressing   Problem: Nutrition: Goal: Adequate nutrition will be maintained Outcome: Progressing   Problem: Coping: Goal: Level of anxiety will decrease Outcome: Progressing   Problem: Elimination: Goal: Will not experience complications related to bowel motility Outcome: Progressing Goal: Will not experience complications related to urinary retention Outcome: Progressing   Problem: Pain Managment: Goal: General experience of comfort will improve Outcome: Progressing   Problem: Safety: Goal: Ability to remain free from injury will improve Outcome: Progressing   Problem: Skin Integrity: Goal: Risk for impaired skin integrity will decrease Outcome: Progressing

## 2023-03-06 NOTE — Progress Notes (Signed)

## 2023-03-06 NOTE — Progress Notes (Signed)
[facility] MD Progress Note  Betty Henderson  MRN:  [id number]  Subjective: Case discussed in multidisciplinary meeting today, chart reviewed, patient seen today during rounds.  Social worker informed that patient has been assigned a guardian via court, the final/official  court hearing is on Jan 14th, 2025  Chart reviewed, case discussed in multidisciplinary team today, patient seen during rounds.  Patient reports she is doing fine.  She reports she slept good last night.  Her appetite is fine.  Patient claims she has attended few groups between yesterday and today.  Patient was encouraged to keep attending groups.  Patient denies thoughts of harming herself or others.    Principal Problem: Schizoaffective disorder, bipolar type (HCC) Diagnosis: Principal Problem:   Schizoaffective disorder, bipolar type (HCC)   Past Psychiatric History: Schizoaffective disorder, bipolar type.  Past Medical History:  Past Medical History:  Diagnosis Date   Anemia    Arthritis    Chronic pain    Drug-seeking behavior    Malingering    Osteopetrosis    Psychosis (HCC)    Schizoaffective disorder, bipolar type (HCC)     Past Surgical History:  Procedure Laterality Date   MOUTH SURGERY     TUBAL LIGATION     Family History:  Family History  Family history unknown: Yes   Family Psychiatric  History: Unremarkable Social History:  Social History   Substance and Sexual Activity  Alcohol Use Not Currently   Comment: 1 cocktail 3 weeks ago     Social History   Substance and Sexual Activity  Drug Use Not Currently   Types: Cocaine   Comment: states "it's legal though"    Social History   Socioeconomic History   Marital status: Single    Spouse name: Not on file   Number of children: Not on file   Years of education: Not on file   Highest education level: Not on file  Occupational History   Not on file  Tobacco Use   Smoking status: Every Day    Current packs/day: 0.50    Types:  Cigarettes   Smokeless tobacco: Never  Vaping Use   Vaping status: Never Used  Substance and Sexual Activity   Alcohol use: Not Currently    Comment: 1 cocktail 3 weeks ago   Drug use: Not Currently    Types: Cocaine    Comment: states "it's legal though"   Sexual activity: Never  Other Topics Concern   Not on file  Social History Narrative   Not on file   Social Drivers of Health   Financial Resource Strain: Not on file  Food Insecurity: Food Insecurity Present (08/22/2022)   Hunger Vital Sign    Worried About Running Out of Food in the Last Year: Sometimes true    Ran Out of Food in the Last Year: Sometimes true  Transportation Needs: Patient Unable To Answer (08/22/2022)   PRAPARE - Transportation    Lack of Transportation (Medical): Patient unable to answer    Lack of Transportation (Non-Medical): Patient unable to answer  Recent Concern: Transportation Needs - Unmet Transportation Needs (06/13/2022)   Received from [facility], [facility]   [facility] - Transportation    Lack of Transportation (Medical): Not on file    Lack of Transportation (Non-Medical): Yes  Physical Activity: Not on file  Stress: Not on file  Social Connections: Unknown (08/06/2021)   Received from [facility]   Social Network    Social Network: Not on  file   Additional Social History:     Patient is homeless at this time.  Social worker is working with DSS on [employer] application and  placement.                    Sleep Good  Appetite:  Good  Current Medications: Current Facility-Administered Medications  Medication Dose Route Frequency Provider Last Rate Last Admin   acetaminophen (TYLENOL) tablet 650 mg  650 mg Oral Q6H PRN [name], DO   650 mg at 03/06/23 1031   alum & mag hydroxide-simeth (MAALOX/MYLANTA) 200-200-20 MG/5ML suspension 30 mL  30 mL Oral Q4H PRN [name], DO   30 mL at 02/28/23 0153   aspirin EC tablet 81 mg  81 mg  Oral Daily [name], MD   81 mg at 03/06/23 1031   atorvastatin (LIPITOR) tablet 20 mg  20 mg Oral Daily [name], MD   20 mg at 03/06/23 1031   diphenhydrAMINE (BENADRYL) capsule 50 mg  50 mg Oral Q6H PRN [name], DO   50 mg at 09/29/22 2254   Or   diphenhydrAMINE (BENADRYL) injection 50 mg  50 mg Intramuscular Q6H PRN [name], DO       glimepiride (AMARYL) tablet 8 mg  8 mg Oral Q breakfast [name], MD   8 mg at 03/06/23 1031   haloperidol (HALDOL) tablet 5 mg  5 mg Oral Q6H PRN [name], DO   5 mg at 09/28/22 2149   Or   haloperidol lactate (HALDOL) injection 5 mg  5 mg Intramuscular Q6H PRN [name], DO       ibuprofen (ADVIL) tablet 600 mg  600 mg Oral Q6H PRN [name], [name], MD   600 mg at 03/05/23 6962   linagliptin (TRADJENTA) tablet 5 mg  5 mg Oral Daily [name], MD   5 mg at 03/06/23 1031   LORazepam (ATIVAN) tablet 1 mg  1 mg Oral TID PRN [name], DO   1 mg at 02/24/23 2139   magnesium hydroxide (MILK OF MAGNESIA) suspension 30 mL  30 mL Oral Daily PRN [name], DO   30 mL at 03/04/23 1149   metFORMIN (GLUCOPHAGE) tablet 1,000 mg  1,000 mg Oral BH-q8a4p [name], DO   1,000 mg at 03/06/23 1030   ondansetron (ZOFRAN) tablet 4 mg  4 mg Oral Q4H PRN [name], MD   4 mg at 02/27/23 2016   oxybutynin (DITROPAN) tablet 5 mg  5 mg Oral BID [name], MD   5 mg at 03/06/23 1031   paliperidone (INVEGA SUSTENNA) injection 156 mg  156 mg Intramuscular Q28 days [name], MD   156 mg at 02/12/23 1010   pantoprazole (PROTONIX) EC tablet 80 mg  80 mg Oral Daily [name], DO   80 mg at 03/06/23 1031   topiramate (TOPAMAX) tablet 25 mg  25 mg Oral BH-q8a4p [name], DO   25 mg at 03/06/23 1031   traZODone (DESYREL) tablet 50 mg  50 mg Oral QHS PRN [name], DO   50 mg at 03/04/23 2111   zonisamide  (ZONEGRAN) capsule 100 mg  100 mg Oral QHS [name], DO   100 mg at 03/05/23 2104    Lab Results:  No results found for this or any previous visit (from the past 48 hours).  Blood Alcohol level:  Lab Results  Component Value Date   ETH <10 08/17/2022   ETH <10 01/11/2021      Musculoskeletal: Strength & Muscle Tone: within normal limits Gait & Station: normal Patient leans: N/A   Psychiatric Specialty Exam:   Presentation  General Appearance:  Casual; Neat   Eye Contact: Fair   Speech: Spontaneous   Speech Volume: Normal   Handedness: Right     Mood and Affect  Mood: "Fine"   Affect: Stable and pleasant    Thought Processes: Improved, at baseline   Descriptions of Associations: Intact   Orientation: Well-oriented   Thought Content: Improved, at baseline  Hallucinations:Denies  Ideas of Reference:None noted   Suicidal Thoughts:Denies SI, no intention or plan  Homicidal Thoughts:Denies HI, no intention or plan   Sensorium  Memory: Immediate Fair; Remote Poor   Judgment: Fair, pt is Rx compliant   Insight: Improved     Executive Functions  Concentration: Improved   Attention Span: Improved   Language: Fair     Psychomotor Activity  Psychomotor Activity: Normal  Assets  Assets: Communication Skills     Sleep  Sleep:Improved     Physical Exam: Physical Exam Vitals and nursing note reviewed.  Constitutional:      Appearance: Normal appearance. She is normal weight.  Neurological:     General: No focal deficit present.     Mental Status: She is alert.      Review of Systems  Constitutional: Negative.   HENT: Negative.    Eyes: Negative.   Respiratory: Negative.    Cardiovascular: Negative.   Gastrointestinal: Negative.   Genitourinary: Negative.   Musculoskeletal: Negative.   Skin: Negative.   Neurological: Negative.   Endo/Heme/Allergies: Negative.     Blood pressure 118/75, pulse  70, temperature 97.7 F (36.5 C), resp. rate 16, height 5\' 6"  (1.676 m), weight 86 kg, SpO2 99%. Body mass index is 30.59 kg/m.   Treatment Plan Summary: Daily contact with patient to assess and evaluate symptoms and progress in treatment, Medication management, and Plan continue current medications.   Continue to monitor patient on as needed meds   Patient received  dose of Invega Sustenna 156 mg IM on  02/12/23 Oxybutynin 5 mg by mouth twice daily to help with urinary incontinence Continue on Metformin and Amaryl.   Continue on atorvastatin for Mixed hyperlipidemia    Patient is awaiting placement    [name], MD

## 2023-03-07 DIAGNOSIS — F250 Schizoaffective disorder, bipolar type: Secondary | ICD-10-CM | POA: Diagnosis not present

## 2023-03-07 NOTE — Group Note (Signed)
Date:  03/07/2023 Time:  3:45 PM  Group Topic/Focus:  Self Esteem Action Plan:   The focus of this group is to help patients create a plan to continue to build self-esteem after discharge. Spirituality:   The focus of this group is to discuss how one's spirituality can aide in recovery.    Participation Level:  Active  Participation Quality:  Appropriate, Attentive, Sharing, and Supportive  Affect:  Appropriate  Cognitive:  Alert, Appropriate, and Oriented  Insight: Appropriate and Good  Engagement in Group:  Engaged and Supportive  Modes of Intervention:  Socialization and Support  Additional Comments:     Alexis Frock 03/07/2023, 3:45 PM

## 2023-03-07 NOTE — Group Note (Signed)
Date:  03/07/2023 Time:  8:20 PM  Group Topic/Focus:  Self Esteem Action Plan:   The focus of this group is to help patients create a plan to continue to build self-esteem after discharge.    Participation Level:  Active  Participation Quality:  Appropriate  Affect:  Appropriate  Cognitive:  Appropriate  Insight: Good  Engagement in Group:  Engaged  Modes of Intervention:  Discussion  Additional Comments:    [name] 03/07/2023, 8:20 PM

## 2023-03-07 NOTE — Progress Notes (Signed)
   03/07/23 0600  15 Minute Checks  Location Bedroom  Visual Appearance Calm  Behavior Sleeping  Sleep (Behavioral Health Patients Only)  Calculate sleep? (Click Yes once per 24 hr at 0600 safety check) Yes  Documented sleep last 24 hours 12.25

## 2023-03-07 NOTE — Progress Notes (Signed)
Patient is a voluntary admission to [facility] for SAD - Bipolar awaiting placement.  Patient is calm, cooperative, friendly with staff. Ambulates with walker, participates in group and meals.  Denies SI, HI, AVH, anxiety and depression.  Will continue to monitor.

## 2023-03-07 NOTE — Progress Notes (Signed)
[facility] MD Progress Note  Betty Henderson  MRN:  [id number]  Subjective: Case discussed in multidisciplinary meeting today, chart reviewed, patient seen today during rounds.  Social worker informed that patient has been assigned a guardian via court, the final/official  court hearing is on Jan 14th, 2025  Chart reviewed, case discussed in multidisciplinary team today, patient seen during rounds.  Patient reports she is doing "Ok" Her appetite is fine.  Patient claims she has attended few groups. Patient was encouraged to keep attending groups.  Patient denies thoughts of harming herself or others.    Principal Problem: Schizoaffective disorder, bipolar type (HCC) Diagnosis: Principal Problem:   Schizoaffective disorder, bipolar type (HCC)   Past Psychiatric History: Schizoaffective disorder, bipolar type.  Past Medical History:  Past Medical History:  Diagnosis Date   Anemia    Arthritis    Chronic pain    Drug-seeking behavior    Malingering    Osteopetrosis    Psychosis (HCC)    Schizoaffective disorder, bipolar type (HCC)     Past Surgical History:  Procedure Laterality Date   MOUTH SURGERY     TUBAL LIGATION     Family History:  Family History  Family history unknown: Yes   Family Psychiatric  History: Unremarkable Social History:  Social History   Substance and Sexual Activity  Alcohol Use Not Currently   Comment: 1 cocktail 3 weeks ago     Social History   Substance and Sexual Activity  Drug Use Not Currently   Types: Cocaine   Comment: states "it's legal though"    Social History   Socioeconomic History   Marital status: Single    Spouse name: Not on file   Number of children: Not on file   Years of education: Not on file   Highest education level: Not on file  Occupational History   Not on file  Tobacco Use   Smoking status: Every Day    Current packs/day: 0.50    Types: Cigarettes   Smokeless tobacco: Never  Vaping Use   Vaping status: Never  Used  Substance and Sexual Activity   Alcohol use: Not Currently    Comment: 1 cocktail 3 weeks ago   Drug use: Not Currently    Types: Cocaine    Comment: states "it's legal though"   Sexual activity: Never  Other Topics Concern   Not on file  Social History Narrative   Not on file   Social Drivers of Health   Financial Resource Strain: Not on file  Food Insecurity: Food Insecurity Present (08/22/2022)   Hunger Vital Sign    Worried About Running Out of Food in the Last Year: Sometimes true    Ran Out of Food in the Last Year: Sometimes true  Transportation Needs: Patient Unable To Answer (08/22/2022)   PRAPARE - Transportation    Lack of Transportation (Medical): Patient unable to answer    Lack of Transportation (Non-Medical): Patient unable to answer  Recent Concern: Transportation Needs - Unmet Transportation Needs (06/13/2022)   Received from [facility], [facility]   [facility] - Transportation    Lack of Transportation (Medical): Not on file    Lack of Transportation (Non-Medical): Yes  Physical Activity: Not on file  Stress: Not on file  Social Connections: Unknown (08/06/2021)   Received from [facility], [facility]   Social Network    Social Network: Not on file   Additional Social History:     Patient is homeless at  this time.  Social worker is working with DSS on [employer] application and  placement.                    Sleep Good  Appetite:  Good  Current Medications: Current Facility-Administered Medications  Medication Dose Route Frequency Provider Last Rate Last Admin   acetaminophen (TYLENOL) tablet 650 mg  650 mg Oral Q6H PRN [name], DO   650 mg at 03/06/23 1031   alum & mag hydroxide-simeth (MAALOX/MYLANTA) 200-200-20 MG/5ML suspension 30 mL  30 mL Oral Q4H PRN [name], DO   30 mL at 02/28/23 0153   aspirin EC tablet 81 mg  81 mg Oral Daily [name], MD   81 mg at 03/07/23 0941   atorvastatin  (LIPITOR) tablet 20 mg  20 mg Oral Daily [name], MD   20 mg at 03/07/23 0940   diphenhydrAMINE (BENADRYL) capsule 50 mg  50 mg Oral Q6H PRN [name], DO   50 mg at 09/29/22 2254   Or   diphenhydrAMINE (BENADRYL) injection 50 mg  50 mg Intramuscular Q6H PRN [name], DO       glimepiride (AMARYL) tablet 8 mg  8 mg Oral Q breakfast [name], MD   8 mg at 03/07/23 2440   haloperidol (HALDOL) tablet 5 mg  5 mg Oral Q6H PRN [name], DO   5 mg at 09/28/22 2149   Or   haloperidol lactate (HALDOL) injection 5 mg  5 mg Intramuscular Q6H PRN [name], DO       ibuprofen (ADVIL) tablet 600 mg  600 mg Oral Q6H PRN [name], [name], MD   600 mg at 03/05/23 1027   linagliptin (TRADJENTA) tablet 5 mg  5 mg Oral Daily [name], MD   5 mg at 03/07/23 2536   LORazepam (ATIVAN) tablet 1 mg  1 mg Oral TID PRN [name], DO   1 mg at 02/24/23 2139   magnesium hydroxide (MILK OF MAGNESIA) suspension 30 mL  30 mL Oral Daily PRN [name], DO   30 mL at 03/04/23 1149   metFORMIN (GLUCOPHAGE) tablet 1,000 mg  1,000 mg Oral BH-q8a4p [name], DO   1,000 mg at 03/07/23 1607   ondansetron (ZOFRAN) tablet 4 mg  4 mg Oral Q4H PRN [name], MD   4 mg at 02/27/23 2016   oxybutynin (DITROPAN) tablet 5 mg  5 mg Oral BID [name], MD   5 mg at 03/07/23 6440   paliperidone (INVEGA SUSTENNA) injection 156 mg  156 mg Intramuscular Q28 days [name], MD   156 mg at 02/12/23 1010   pantoprazole (PROTONIX) EC tablet 80 mg  80 mg Oral Daily [name], DO   80 mg at 03/07/23 3474   topiramate (TOPAMAX) tablet 25 mg  25 mg Oral BH-q8a4p [name], DO   25 mg at 03/07/23 1608   traZODone (DESYREL) tablet 50 mg  50 mg Oral QHS PRN [name], DO   50 mg at 03/06/23 2204   zonisamide (ZONEGRAN) capsule 100 mg  100 mg Oral QHS [name], DO   100  mg at 03/06/23 2204    Lab Results:  No results found for this or any previous visit (from the past 48 hours).     Blood Alcohol level:  Lab Results  Component Value Date   ETH <10  08/17/2022   ETH <10 01/11/2021      Musculoskeletal: Strength & Muscle Tone: within normal limits Gait & Station: normal Patient leans: N/A   Psychiatric Specialty Exam:   Presentation  General Appearance:  Casual; Neat   Eye Contact: Fair   Speech: Spontaneous   Speech Volume: Normal   Handedness: Right     Mood and Affect  Mood: "Fine"   Affect: Stable and pleasant    Thought Processes: Improved, at baseline   Descriptions of Associations: Intact   Orientation: Well-oriented   Thought Content: Improved, at baseline  Hallucinations:Denies  Ideas of Reference:None noted   Suicidal Thoughts:Denies SI, no intention or plan  Homicidal Thoughts:Denies HI, no intention or plan   Sensorium  Memory: Immediate Fair; Remote Poor   Judgment: Fair, pt is Rx compliant   Insight: Improved     Executive Functions  Concentration: Improved   Attention Span: Improved   Language: Fair     Psychomotor Activity  Psychomotor Activity: Normal  Assets  Assets: Communication Skills     Sleep  Sleep:Improved     Physical Exam: Physical Exam Vitals and nursing note reviewed.  Constitutional:      Appearance: Normal appearance. She is normal weight.  Neurological:     General: No focal deficit present.     Mental Status: She is alert.      Review of Systems  Constitutional: Negative.   HENT: Negative.    Eyes: Negative.   Respiratory: Negative.    Cardiovascular: Negative.   Gastrointestinal: Negative.   Genitourinary: Negative.   Musculoskeletal: Negative.   Skin: Negative.   Neurological: Negative.   Endo/Heme/Allergies: Negative.     Blood pressure 107/63, pulse 99, temperature (!) 97.2 F (36.2 C), resp. rate 18, height 5\' 6"  (1.676 m),  weight 86 kg, SpO2 100%. Body mass index is 30.59 kg/m.   Treatment Plan Summary: Daily contact with patient to assess and evaluate symptoms and progress in treatment, Medication management, and Plan continue current medications.   Continue to monitor patient on as needed meds   Patient received  dose of Invega Sustenna 156 mg IM on  02/12/23 Oxybutynin 5 mg by mouth twice daily to help with urinary incontinence Continue on Metformin and Amaryl.   Continue on atorvastatin for Mixed hyperlipidemia    Patient is awaiting placement    [name], MD

## 2023-03-07 NOTE — Plan of Care (Signed)
  Problem: Education: Goal: Knowledge of General Education information will improve Description: Including pain rating scale, medication(s)/side effects and non-pharmacologic comfort measures Outcome: Progressing   Problem: Health Behavior/Discharge Planning: Goal: Ability to manage health-related needs will improve Outcome: Progressing   Problem: Clinical Measurements: Goal: Ability to maintain clinical measurements within normal limits will improve Outcome: Progressing Goal: Diagnostic test results will improve Outcome: Progressing   Problem: Nutrition: Goal: Adequate nutrition will be maintained Outcome: Progressing   Problem: Coping: Goal: Level of anxiety will decrease Outcome: Progressing   Problem: Elimination: Goal: Will not experience complications related to bowel motility Outcome: Progressing Goal: Will not experience complications related to urinary retention Outcome: Progressing   Problem: Pain Managment: Goal: General experience of comfort will improve Outcome: Progressing   Problem: Safety: Goal: Ability to remain free from injury will improve Outcome: Progressing   Problem: Skin Integrity: Goal: Risk for impaired skin integrity will decrease Outcome: Progressing

## 2023-03-07 NOTE — Group Note (Signed)
Date:  03/07/2023 Time:  3:59 PM  Group Topic/Focus:  Managing Feelings:   The focus of this group is to identify what feelings patients have difficulty handling and develop a plan to handle them in a healthier way upon discharge. Rediscovering Joy:   The focus of this group is to explore various ways to relieve stress in a positive manner.    Participation Level:  Did Not Attend  Participation Quality:      Affect:      Cognitive:      Insight: None  Engagement in Group:      Modes of Intervention:      Additional Comments:     Alexis Frock 03/07/2023, 3:59 PM

## 2023-03-07 NOTE — BHH Counselor (Signed)
CSW contacted the following in an effort to obtain a group home for the patinet\:   Mauri Reading: CSW left HIPAA compliant voicemail. Clinton Sawyer: Not appropriate Clement: No female bed Darrel Anselm Lis: Mailbox is full unable to leave message. Quillian Quince McAdams: Only accepts Innovation waiver patients Kandis Mannan: Female bed available at end January [phone] Valerie Roys: CSW left HIPAA compliant voicemail. [phone] Claris Gladden: CSW left HIPAA compliant voicemail. Birder Robson: CSW left HIPAA compliant voicemail. Rowan Blase Ray: Marcina Millard:  No female bed available. Tami Lin:  No beds available.  Gloris Manchester: No beds available. [facility]: Number not in service Norma: CSW left HIPAA compliant voicemail. Blake Divine: Number not in service Flint Melter: No beds available. Chales Abrahams [place]: All female but no beds available. Juleen China: Number not in service.  Penni Homans, MSW, LCSW 03/07/2023 3:35 PM

## 2023-03-07 NOTE — Progress Notes (Signed)
Patient observed sitting in the dayroom, watching tv, and interacting appropriately with staff and peers. Patient denies SI/HI/AVH/pain and anxiety during shift assessment. Patient attended PM group, ate a snack and went to bed without incident. Patient med compliant and received PRN Trazodone 50 mg PO for insomnia. Medication effective, no acute distress noted, VSS. Routine observations to continue to monitor patient safety.

## 2023-03-08 DIAGNOSIS — F250 Schizoaffective disorder, bipolar type: Secondary | ICD-10-CM | POA: Diagnosis not present

## 2023-03-08 NOTE — Progress Notes (Signed)
   03/07/23 2300  Psych Admission Type (Psych Patients Only)  Admission Status Voluntary  Psychosocial Assessment  Patient Complaints Sadness;Isolation  Eye Contact Fair  Facial Expression Sullen;Sad  Affect Appropriate to circumstance  Speech Logical/coherent  Interaction Minimal  Motor Activity Slow  Appearance/Hygiene Layered clothes  Behavior Characteristics Cooperative  Mood Pleasant  Thought Process  Coherency WDL  Content WDL  Delusions None reported or observed  Perception Hallucinations  Hallucination Auditory  Judgment Impaired  Confusion None  Danger to Self  Current suicidal ideation? Denies  Agreement Not to Harm Self Yes  Description of Agreement Verbal  Danger to Others  Danger to Others None reported or observed  Danger to Others Abnormal  Harmful Behavior to others No threats or harm toward other people  Destructive Behavior No threats or harm toward property

## 2023-03-08 NOTE — Group Note (Signed)
Date:  03/08/2023 Time:  10:52 AM  Group Topic/Focus:  Anger Management  Coping With Mental Health Crisis:   The purpose of this group is to help patients identify strategies for coping with mental health crisis.  Group discusses possible causes of crisis and ways to manage them effectively. Conflict Resolution:   The focus of this group is to discuss the conflict resolution process and how it may be used upon discharge. Goals Group:   The focus of this group is to help patients establish daily goals to achieve during treatment and discuss how the patient can incorporate goal setting into their daily lives to aide in recovery. Healthy Communication:   The focus of this group is to discuss communication, barriers to communication, as well as healthy ways to communicate with others.    Participation Level:  Didn't Attend  Participation Quality:   Affect:    Cognitive:    Insight:   Engagement in Group:    Modes of Intervention:    Additional Comments:    Yulissa Needham L Shereka Lafortune 03/08/2023, 10:52 AM

## 2023-03-08 NOTE — Plan of Care (Signed)
  Problem: Education: Goal: Knowledge of General Education information will improve Description: Including pain rating scale, medication(s)/side effects and non-pharmacologic comfort measures Outcome: Progressing   Problem: Health Behavior/Discharge Planning: Goal: Ability to manage health-related needs will improve Outcome: Progressing   Problem: Clinical Measurements: Goal: Ability to maintain clinical measurements within normal limits will improve Outcome: Progressing Goal: Diagnostic test results will improve Outcome: Progressing   Problem: Nutrition: Goal: Adequate nutrition will be maintained Outcome: Progressing   Problem: Coping: Goal: Level of anxiety will decrease Outcome: Progressing   Problem: Elimination: Goal: Will not experience complications related to bowel motility Outcome: Progressing Goal: Will not experience complications related to urinary retention Outcome: Progressing   Problem: Pain Managment: Goal: General experience of comfort will improve Outcome: Progressing   Problem: Safety: Goal: Ability to remain free from injury will improve Outcome: Progressing   Problem: Skin Integrity: Goal: Risk for impaired skin integrity will decrease Outcome: Progressing

## 2023-03-08 NOTE — Progress Notes (Signed)
   03/08/23 0657  15 Minute Checks  Location Bedroom  Visual Appearance Calm  Behavior Sleeping  Sleep (Behavioral Health Patients Only)  Calculate sleep? (Click Yes once per 24 hr at 0600 safety check) Yes  Documented sleep last 24 hours 10.5

## 2023-03-08 NOTE — Plan of Care (Signed)
D: Pt alert and oriented. Pt reports experiencing depression however, denies experiencing any anxiety at this time. Pt denies experiencing any pain at this time. Pt denies experiencing any SI/HI, or AVH at this time.   A: Scheduled medications administered to pt, per MD orders. Support and encouragement provided. Frequent verbal contact made. Routine safety checks conducted q15 minutes.   R: No adverse drug reactions noted. Pt verbally contracts for safety at this time. Pt compliant with medications and treatment plan. Pt interacts well but minimally with others on the unit. Pt remains safe at this time. Plan of care ongoing.  Pt reports being depressed d/t still being here and is ready to discharge.   Problem: Nutrition: Goal: Adequate nutrition will be maintained Outcome: Progressing   Problem: Pain Managment: Goal: General experience of comfort will improve Outcome: Progressing

## 2023-03-08 NOTE — Progress Notes (Signed)
[facility] MD Progress Note  Betty Henderson  MRN:  [id number]  Subjective: Case discussed in multidisciplinary meeting today, chart reviewed, patient seen today during rounds.  Social worker informed that patient has been assigned a guardian via court, the final/official  court hearing is on Jan 14th, 2025  Chart reviewed, case discussed in multidisciplinary team today, patient seen during rounds.  Patient reports she is doing "Ok" Her appetite is fine.  Patient later mentioned that she feels sad about being here.  Patient was informed that social worker is working on placement.  Patient was reminded of DSS court hearing for guardianship on January 14, at 2025.  Patient was encouraged to keep attending groups.  Patient denies thoughts of harming herself or others.    Principal Problem: Schizoaffective disorder, bipolar type (HCC) Diagnosis: Principal Problem:   Schizoaffective disorder, bipolar type (HCC)   Past Psychiatric History: Schizoaffective disorder, bipolar type.  Past Medical History:  Past Medical History:  Diagnosis Date   Anemia    Arthritis    Chronic pain    Drug-seeking behavior    Malingering    Osteopetrosis    Psychosis (HCC)    Schizoaffective disorder, bipolar type (HCC)     Past Surgical History:  Procedure Laterality Date   MOUTH SURGERY     TUBAL LIGATION     Family History:  Family History  Family history unknown: Yes   Family Psychiatric  History: Unremarkable Social History:  Social History   Substance and Sexual Activity  Alcohol Use Not Currently   Comment: 1 cocktail 3 weeks ago     Social History   Substance and Sexual Activity  Drug Use Not Currently   Types: Cocaine   Comment: states "it's legal though"    Social History   Socioeconomic History   Marital status: Single    Spouse name: Not on file   Number of children: Not on file   Years of education: Not on file   Highest education level: Not on file  Occupational History    Not on file  Tobacco Use   Smoking status: Every Day    Current packs/day: 0.50    Types: Cigarettes   Smokeless tobacco: Never  Vaping Use   Vaping status: Never Used  Substance and Sexual Activity   Alcohol use: Not Currently    Comment: 1 cocktail 3 weeks ago   Drug use: Not Currently    Types: Cocaine    Comment: states "it's legal though"   Sexual activity: Never  Other Topics Concern   Not on file  Social History Narrative   Not on file   Social Drivers of Health   Financial Resource Strain: Not on file  Food Insecurity: Food Insecurity Present (08/22/2022)   Hunger Vital Sign    Worried About Running Out of Food in the Last Year: Sometimes true    Ran Out of Food in the Last Year: Sometimes true  Transportation Needs: Patient Unable To Answer (08/22/2022)   PRAPARE - Transportation    Lack of Transportation (Medical): Patient unable to answer    Lack of Transportation (Non-Medical): Patient unable to answer  Recent Concern: Transportation Needs - Unmet Transportation Needs (06/13/2022)   Received from [facility], [facility]   [facility] - Transportation    Lack of Transportation (Medical): Not on file    Lack of Transportation (Non-Medical): Yes  Physical Activity: Not on file  Stress: Not on file  Social Connections: Unknown (08/06/2021)   Received  from [facility], [facility]   Social Network    Social Network: Not on file   Additional Social History:     Patient is homeless at this time.  Social worker is working with DSS on [employer] application and  placement.                    Sleep Good  Appetite:  Good  Current Medications: Current Facility-Administered Medications  Medication Dose Route Frequency Provider Last Rate Last Admin   acetaminophen (TYLENOL) tablet 650 mg  650 mg Oral Q6H PRN [name], DO   650 mg at 03/06/23 1031   alum & mag hydroxide-simeth (MAALOX/MYLANTA) 200-200-20 MG/5ML suspension 30 mL  30 mL Oral  Q4H PRN [name], DO   30 mL at 02/28/23 0153   aspirin EC tablet 81 mg  81 mg Oral Daily [name], MD   81 mg at 03/08/23 1013   atorvastatin (LIPITOR) tablet 20 mg  20 mg Oral Daily [name], MD   20 mg at 03/08/23 1014   diphenhydrAMINE (BENADRYL) capsule 50 mg  50 mg Oral Q6H PRN [name], DO   50 mg at 09/29/22 2254   Or   diphenhydrAMINE (BENADRYL) injection 50 mg  50 mg Intramuscular Q6H PRN [name], DO       glimepiride (AMARYL) tablet 8 mg  8 mg Oral Q breakfast [name], MD   8 mg at 03/08/23 1013   haloperidol (HALDOL) tablet 5 mg  5 mg Oral Q6H PRN [name], DO   5 mg at 09/28/22 2149   Or   haloperidol lactate (HALDOL) injection 5 mg  5 mg Intramuscular Q6H PRN [name], DO       ibuprofen (ADVIL) tablet 600 mg  600 mg Oral Q6H PRN [name], [name], MD   600 mg at 03/05/23 6962   linagliptin (TRADJENTA) tablet 5 mg  5 mg Oral Daily [name], MD   5 mg at 03/08/23 1014   LORazepam (ATIVAN) tablet 1 mg  1 mg Oral TID PRN [name], DO   1 mg at 02/24/23 2139   magnesium hydroxide (MILK OF MAGNESIA) suspension 30 mL  30 mL Oral Daily PRN [name], DO   30 mL at 03/04/23 1149   metFORMIN (GLUCOPHAGE) tablet 1,000 mg  1,000 mg Oral BH-q8a4p [name], DO   1,000 mg at 03/08/23 1014   ondansetron (ZOFRAN) tablet 4 mg  4 mg Oral Q4H PRN [name], MD   4 mg at 02/27/23 2016   oxybutynin (DITROPAN) tablet 5 mg  5 mg Oral BID [name], MD   5 mg at 03/08/23 1013   paliperidone (INVEGA SUSTENNA) injection 156 mg  156 mg Intramuscular Q28 days [name], MD   156 mg at 02/12/23 1010   pantoprazole (PROTONIX) EC tablet 80 mg  80 mg Oral Daily [name], DO   80 mg at 03/08/23 1025   topiramate (TOPAMAX) tablet 25 mg  25 mg Oral BH-q8a4p [name], DO   25 mg at 03/08/23 1013   traZODone (DESYREL) tablet 50  mg  50 mg Oral QHS PRN [name], DO   50 mg at 03/07/23 2147   zonisamide (ZONEGRAN) capsule 100 mg  100 mg Oral QHS [name], DO   100 mg at 03/07/23 2147    Lab Results:  No results  found for this or any previous visit (from the past 48 hours).     Blood Alcohol level:  Lab Results  Component Value Date   ETH <10 08/17/2022   ETH <10 01/11/2021      Musculoskeletal: Strength & Muscle Tone: within normal limits Gait & Station: normal Patient leans: N/A   Psychiatric Specialty Exam:   Presentation  General Appearance:  Casual; Neat   Eye Contact: Fair   Speech: Spontaneous   Speech Volume: Normal   Handedness: Right     Mood and Affect  Mood: "Fine"   Affect: Stable and pleasant    Thought Processes: Improved, at baseline   Descriptions of Associations: Intact   Orientation: Well-oriented   Thought Content: Improved, at baseline  Hallucinations:Denies  Ideas of Reference:None noted   Suicidal Thoughts:Denies SI, no intention or plan  Homicidal Thoughts:Denies HI, no intention or plan   Sensorium  Memory: Immediate Fair; Remote Poor   Judgment: Fair, pt is Rx compliant   Insight: Improved     Executive Functions  Concentration: Improved   Attention Span: Improved   Language: Fair     Psychomotor Activity  Psychomotor Activity: Normal  Assets  Assets: Communication Skills     Sleep  Sleep:Improved     Physical Exam: Physical Exam Vitals and nursing note reviewed.  Constitutional:      Appearance: Normal appearance. She is normal weight.  Neurological:     General: No focal deficit present.     Mental Status: She is alert.      Review of Systems  Constitutional: Negative.   HENT: Negative.    Eyes: Negative.   Respiratory: Negative.    Cardiovascular: Negative.   Gastrointestinal: Negative.   Genitourinary: Negative.   Musculoskeletal: Negative.   Skin: Negative.    Neurological: Negative.   Endo/Heme/Allergies: Negative.     Blood pressure (!) 90/54, pulse 66, temperature (!) 97.3 F (36.3 C), resp. rate 17, height 5\' 6"  (1.676 m), weight 86 kg, SpO2 98%. Body mass index is 30.59 kg/m.   Treatment Plan Summary: Daily contact with patient to assess and evaluate symptoms and progress in treatment, Medication management, and Plan continue current medications.   Continue to monitor patient on as needed meds   Patient received  dose of Invega Sustenna 156 mg IM on  02/12/23 Oxybutynin 5 mg by mouth twice daily to help with urinary incontinence Continue on Metformin and Amaryl.   Continue on atorvastatin for Mixed hyperlipidemia    Patient is awaiting placement    [name], MD

## 2023-03-09 DIAGNOSIS — F250 Schizoaffective disorder, bipolar type: Secondary | ICD-10-CM | POA: Diagnosis not present

## 2023-03-09 MED ORDER — DOCUSATE SODIUM 100 MG PO CAPS
100.0000 mg | ORAL_CAPSULE | Freq: Two times a day (BID) | ORAL | Status: AC
  Administered 2023-03-09 – 2023-03-11 (×6): 100 mg via ORAL
  Filled 2023-03-09 (×6): qty 1

## 2023-03-09 NOTE — Progress Notes (Signed)
   03/09/23 0615  15 Minute Checks  Location Bedroom  Visual Appearance Calm  Behavior Sleeping  Sleep (Behavioral Health Patients Only)  Calculate sleep? (Click Yes once per 24 hr at 0600 safety check) Yes  Documented sleep last 24 hours 15

## 2023-03-09 NOTE — Group Note (Signed)
LCSW Group Therapy Note  Group Date: 03/09/2023 Start Time: 1005 End Time: 1045   Type of Therapy and Topic:  Group Therapy: Using "I" Statements  Participation Level:  Active  Description of Group:  Patients were asked to provide details of some interpersonal conflicts they have experienced. Patients were then educated about "I" statements, communication which focuses on feelings or views of the speaker rather than what the other person is doing. T group members were asked to reflect on past conflicts and to provide specific examples for utilizing "I" statements.  Therapeutic Goals:  Patients will verbalize understanding of ineffective communication and effective communication. Patients will be able to empathize with whom they are having conflict. Patients will practice effective communication in the form of "I" statements.    Summary of Patient Progress:   The patient was present/active throughout the session and proved open to feedback from CSW and peers. Patient demonstrated positive insight into the subject matter, was respectful of peers, and was present throughout the entire session.  Therapeutic Modalities:   Cognitive Behavioral Therapy Solution-Focused Therapy    Whitney Post, LCSW 03/09/2023  11:09 AM

## 2023-03-09 NOTE — Progress Notes (Signed)
[facility] MD Progress Note  [facility]  MRN:  [id number]  Subjective: Case discussed in multidisciplinary meeting today, chart reviewed, patient seen today during rounds.  Social worker informed that patient has been assigned a guardian via court, the final/official  court hearing is on Jan 14th, 2025  Chart reviewed, case discussed in multidisciplinary team today, patient seen during rounds.  Patient reports she is doing "fine".  She reports she has attended 2 groups this morning.  Her appetite is fine.  Patient is concerned about discharge date yet.  Patient was informed that social worker is working on placement.  Patient was reminded of DSS court hearing for guardianship on January 14, at 2025.  Patient was encouraged to keep attending groups.  Patient denies thoughts of harming herself or others.    Principal Problem: Schizoaffective disorder, bipolar type (HCC) Diagnosis: Principal Problem:   Schizoaffective disorder, bipolar type (HCC)   Past Psychiatric History: Schizoaffective disorder, bipolar type.  Past Medical History:  Past Medical History:  Diagnosis Date   Anemia    Arthritis    Chronic pain    Drug-seeking behavior    Malingering    Osteopetrosis    Psychosis (HCC)    Schizoaffective disorder, bipolar type (HCC)     Past Surgical History:  Procedure Laterality Date   MOUTH SURGERY     TUBAL LIGATION     Family History:  Family History  Family history unknown: Yes   Family Psychiatric  History: Unremarkable Social History:  Social History   Substance and Sexual Activity  Alcohol Use Not Currently   Comment: 1 cocktail 3 weeks ago     Social History   Substance and Sexual Activity  Drug Use Not Currently   Types: Cocaine   Comment: states "it's legal though"    Social History   Socioeconomic History   Marital status: Single    Spouse name: Not on file   Number of children: Not on file   Years of education: Not on file   Highest education  level: Not on file  Occupational History   Not on file  Tobacco Use   Smoking status: Every Day    Current packs/day: 0.50    Types: Cigarettes   Smokeless tobacco: Never  Vaping Use   Vaping status: Never Used  Substance and Sexual Activity   Alcohol use: Not Currently    Comment: 1 cocktail 3 weeks ago   Drug use: Not Currently    Types: Cocaine    Comment: states "it's legal though"   Sexual activity: Never  Other Topics Concern   Not on file  Social History Narrative   Not on file   Social Drivers of Health   Financial Resource Strain: Not on file  Food Insecurity: Food Insecurity Present (08/22/2022)   Hunger Vital Sign    Worried About Running Out of Food in the Last Year: Sometimes true    Ran Out of Food in the Last Year: Sometimes true  Transportation Needs: Patient Unable To Answer (08/22/2022)   PRAPARE - Transportation    Lack of Transportation (Medical): Patient unable to answer    Lack of Transportation (Non-Medical): Patient unable to answer  Recent Concern: Transportation Needs - Unmet Transportation Needs (06/13/2022)   Received from [facility], [facility]   [facility] - Transportation    Lack of Transportation (Medical): Not on file    Lack of Transportation (Non-Medical): Yes  Physical Activity: Not on file  Stress: Not on file  Social Connections: Unknown (08/06/2021)   Received from [facility], [facility]   Social Network    Social Network: Not on file   Additional Social History:     Patient is homeless at this time.  Social worker is working with DSS on [employer] application and  placement.                    Sleep Good  Appetite:  Good  Current Medications: Current Facility-Administered Medications  Medication Dose Route Frequency Provider Last Rate Last Admin   acetaminophen (TYLENOL) tablet 650 mg  650 mg Oral Q6H PRN [name], DO   650 mg at 03/08/23 1814   alum & mag hydroxide-simeth (MAALOX/MYLANTA)  200-200-20 MG/5ML suspension 30 mL  30 mL Oral Q4H PRN [name], DO   30 mL at 02/28/23 0153   aspirin EC tablet 81 mg  81 mg Oral Daily [name], MD   81 mg at 03/09/23 4098   atorvastatin (LIPITOR) tablet 20 mg  20 mg Oral Daily [name], MD   20 mg at 03/09/23 0908   diphenhydrAMINE (BENADRYL) capsule 50 mg  50 mg Oral Q6H PRN [name], DO   50 mg at 09/29/22 2254   Or   diphenhydrAMINE (BENADRYL) injection 50 mg  50 mg Intramuscular Q6H PRN [name], DO       docusate sodium (COLACE) capsule 100 mg  100 mg Oral BID [name], MD   100 mg at 03/09/23 1201   glimepiride (AMARYL) tablet 8 mg  8 mg Oral Q breakfast [name], MD   8 mg at 03/09/23 0908   haloperidol (HALDOL) tablet 5 mg  5 mg Oral Q6H PRN [name], DO   5 mg at 09/28/22 2149   Or   haloperidol lactate (HALDOL) injection 5 mg  5 mg Intramuscular Q6H PRN [name], DO       ibuprofen (ADVIL) tablet 600 mg  600 mg Oral Q6H PRN [name], [name], MD   600 mg at 03/05/23 1191   linagliptin (TRADJENTA) tablet 5 mg  5 mg Oral Daily [name], MD   5 mg at 03/09/23 4782   LORazepam (ATIVAN) tablet 1 mg  1 mg Oral TID PRN [name], DO   1 mg at 02/24/23 2139   magnesium hydroxide (MILK OF MAGNESIA) suspension 30 mL  30 mL Oral Daily PRN [name], DO   30 mL at 03/04/23 1149   metFORMIN (GLUCOPHAGE) tablet 1,000 mg  1,000 mg Oral BH-q8a4p [name], DO   1,000 mg at 03/09/23 0909   ondansetron (ZOFRAN) tablet 4 mg  4 mg Oral Q4H PRN [name], MD   4 mg at 02/27/23 2016   oxybutynin (DITROPAN) tablet 5 mg  5 mg Oral BID [name], MD   5 mg at 03/09/23 0910   paliperidone (INVEGA SUSTENNA) injection 156 mg  156 mg Intramuscular Q28 days [name], MD   156 mg at 02/12/23 1010   pantoprazole (PROTONIX) EC tablet 80 mg  80 mg Oral Daily [name], DO   80 mg at  03/09/23 9562   topiramate (TOPAMAX) tablet 25 mg  25 mg Oral BH-q8a4p [name], DO   25 mg at 03/09/23 1308   traZODone (DESYREL) tablet 50 mg  50 mg Oral QHS PRN [name], DO   50 mg at 03/08/23 2157  zonisamide (ZONEGRAN) capsule 100 mg  100 mg Oral QHS [name], DO   100 mg at 03/08/23 2157    Lab Results:  No results found for this or any previous visit (from the past 48 hours).     Blood Alcohol level:  Lab Results  Component Value Date   ETH <10 08/17/2022   ETH <10 01/11/2021      Musculoskeletal: Strength & Muscle Tone: within normal limits Gait & Station: normal Patient leans: N/A   Psychiatric Specialty Exam:   Presentation  General Appearance:  Casual; Neat   Eye Contact: Fair   Speech: Spontaneous   Speech Volume: Normal   Handedness: Right     Mood and Affect  Mood: "Fine"   Affect: Stable and pleasant    Thought Processes: Improved, at baseline   Descriptions of Associations: Intact   Orientation: Well-oriented   Thought Content: Improved, at baseline  Hallucinations:Denies  Ideas of Reference:None noted   Suicidal Thoughts:Denies SI, no intention or plan  Homicidal Thoughts:Denies HI, no intention or plan   Sensorium  Memory: Immediate Fair; Remote Poor   Judgment: Fair, pt is Rx compliant   Insight: Improved     Executive Functions  Concentration: Improved   Attention Span: Improved   Language: Fair     Psychomotor Activity  Psychomotor Activity: Normal  Assets  Assets: Communication Skills     Sleep  Sleep:Improved     Physical Exam: Physical Exam Vitals and nursing note reviewed.  Constitutional:      Appearance: Normal appearance. She is normal weight.  Neurological:     General: No focal deficit present.     Mental Status: She is alert.      Review of Systems  Constitutional: Negative.   HENT: Negative.    Eyes: Negative.    Respiratory: Negative.    Cardiovascular: Negative.   Gastrointestinal: Negative.   Genitourinary: Negative.   Musculoskeletal: Negative.   Skin: Negative.   Neurological: Negative.   Endo/Heme/Allergies: Negative.     Blood pressure 103/71, pulse 81, temperature (!) 97.4 F (36.3 C), resp. rate 18, height 5\' 6"  (1.676 m), weight 86 kg, SpO2 98%. Body mass index is 30.59 kg/m.   Treatment Plan Summary: Daily contact with patient to assess and evaluate symptoms and progress in treatment, Medication management, and Plan continue current medications.   Continue to monitor patient on as needed meds   Patient received  dose of Invega Sustenna 156 mg IM on  02/12/23 Oxybutynin 5 mg by mouth twice daily to help with urinary incontinence Continue on Metformin and Amaryl.   Continue on atorvastatin for Mixed hyperlipidemia  Patient is awaiting placement    [name], MD

## 2023-03-09 NOTE — Progress Notes (Signed)
Patient observed lying in bed at shift change. Patient woke for shift assessment and participated appropriately. Patient reports increased depression and sadness relating to her extended stay and wishes she could go home for Christmas. Patient denies SI/HI/AVH and pain but admits to feeling more anxiety today. Patient med compliant and received PRN Trazodone 50 mg PO for insomnia. BP 98/55 while lying, but with manual BP check after having patient ambulate results 106/66. Patient asymptomatic, ate a snack and went to bed without incident. No acute distress noted, routine observations to continue Q 15 mins to monitor safety.

## 2023-03-09 NOTE — Plan of Care (Signed)
  Problem: Education: Goal: Knowledge of General Education information will improve Description: Including pain rating scale, medication(s)/side effects and non-pharmacologic comfort measures Outcome: Progressing   Problem: Health Behavior/Discharge Planning: Goal: Ability to manage health-related needs will improve Outcome: Progressing   Problem: Clinical Measurements: Goal: Ability to maintain clinical measurements within normal limits will improve Outcome: Progressing Goal: Diagnostic test results will improve Outcome: Progressing   Problem: Nutrition: Goal: Adequate nutrition will be maintained Outcome: Progressing   Problem: Coping: Goal: Level of anxiety will decrease Outcome: Progressing   Problem: Elimination: Goal: Will not experience complications related to bowel motility Outcome: Progressing Goal: Will not experience complications related to urinary retention Outcome: Progressing   Problem: Pain Managment: Goal: General experience of comfort will improve Outcome: Progressing   Problem: Safety: Goal: Ability to remain free from injury will improve Outcome: Progressing   Problem: Skin Integrity: Goal: Risk for impaired skin integrity will decrease Outcome: Progressing

## 2023-03-09 NOTE — Progress Notes (Signed)
Patient is a voluntary admission to [facility] - bipoar awaiting placement.  Patient is calm, cooperative and friendly with staff and peers. Joined in with group activities, meals and snack time.  Denies SI, HI, AVH, anxiety and endorses depression but did not elaborate or able to give 0-10 answer for level.  Will continue to monitor.

## 2023-03-09 NOTE — Group Note (Signed)
Date:  03/09/2023 Time:  10:25 PM  Group Topic/Focus:  Building Self Esteem:   The Focus of this group is helping patients become aware of the effects of self-esteem on their lives, the things they and others do that enhance or undermine their self-esteem, seeing the relationship between their level of self-esteem and the choices they make and learning ways to enhance self-esteem. Making Healthy Choices:   The focus of this group is to help patients identify negative/unhealthy choices they were using prior to admission and identify positive/healthier coping strategies to replace them upon discharge. Self Care:   The focus of this group is to help patients understand the importance of self-care in order to improve or restore emotional, physical, spiritual, interpersonal, and financial health.    Participation Level:  Active  Participation Quality:  Appropriate  Affect:  Appropriate  Cognitive:  Alert  Insight: Good  Engagement in Group:  Improving  Modes of Intervention:  Discussion  Additional Comments:    [name] 03/09/2023, 10:25 PM

## 2023-03-09 NOTE — BH IP Treatment Plan (Signed)
Interdisciplinary Treatment and Diagnostic Plan Update  03/09/2023 Time of Session: 11:32 am Lajuanda Yaffe MRN: [id number]  Principal Diagnosis: Schizoaffective disorder, bipolar type [facility])  Secondary Diagnoses: Principal Problem:   Schizoaffective disorder, bipolar type (HCC)   Current Medications:  Current Facility-Administered Medications  Medication Dose Route Frequency Provider Last Rate Last Admin   acetaminophen (TYLENOL) tablet 650 mg  650 mg Oral Q6H PRN [name], DO   650 mg at 03/08/23 1814   alum & mag hydroxide-simeth (MAALOX/MYLANTA) 200-200-20 MG/5ML suspension 30 mL  30 mL Oral Q4H PRN [name], DO   30 mL at 02/28/23 0153   aspirin EC tablet 81 mg  81 mg Oral Daily [name], MD   81 mg at 03/09/23 5621   atorvastatin (LIPITOR) tablet 20 mg  20 mg Oral Daily [name], MD   20 mg at 03/09/23 0908   diphenhydrAMINE (BENADRYL) capsule 50 mg  50 mg Oral Q6H PRN [name], DO   50 mg at 09/29/22 2254   Or   diphenhydrAMINE (BENADRYL) injection 50 mg  50 mg Intramuscular Q6H PRN [name], DO       glimepiride (AMARYL) tablet 8 mg  8 mg Oral Q breakfast [name], MD   8 mg at 03/09/23 0908   haloperidol (HALDOL) tablet 5 mg  5 mg Oral Q6H PRN [name], DO   5 mg at 09/28/22 2149   Or   haloperidol lactate (HALDOL) injection 5 mg  5 mg Intramuscular Q6H PRN [name], DO       ibuprofen (ADVIL) tablet 600 mg  600 mg Oral Q6H PRN [name], [name], MD   600 mg at 03/05/23 0829   linagliptin (TRADJENTA) tablet 5 mg  5 mg Oral Daily [name], MD   5 mg at 03/09/23 0910   LORazepam (ATIVAN) tablet 1 mg  1 mg Oral TID PRN [name], DO   1 mg at 02/24/23 2139   magnesium hydroxide (MILK OF MAGNESIA) suspension 30 mL  30 mL Oral Daily PRN [name], DO   30 mL at 03/04/23 1149   metFORMIN (GLUCOPHAGE) tablet 1,000 mg  1,000 mg Oral BH-[id number]  [name], DO   1,000 mg at 03/09/23 0909   ondansetron (ZOFRAN) tablet 4 mg  4 mg Oral Q4H PRN [name], MD   4 mg at 02/27/23 2016   oxybutynin (DITROPAN) tablet 5 mg  5 mg Oral BID [name], MD   5 mg at 03/09/23 0910   paliperidone (INVEGA SUSTENNA) injection 156 mg  156 mg Intramuscular Q28 days [name], MD   156 mg at 02/12/23 1010   pantoprazole (PROTONIX) EC tablet 80 mg  80 mg Oral Daily [name], DO   80 mg at 03/09/23 3086   topiramate (TOPAMAX) tablet 25 mg  25 mg Oral BH-q8a4p [name], DO   25 mg at 03/09/23 5784   traZODone (DESYREL) tablet 50 mg  50 mg Oral QHS PRN [name], DO   50 mg at 03/08/23 2157   zonisamide (ZONEGRAN) capsule 100 mg  100 mg Oral QHS [name], DO   100 mg at 03/08/23 2157   PTA Medications: Medications Prior to Admission  Medication Sig Dispense Refill Last Dose/Taking   ondansetron (ZOFRAN) 4 MG tablet Take 1 tablet (4 mg total) by mouth every 8 (eight) hours as needed for nausea or  vomiting. (Patient not taking: Reported on 08/17/2022) 20 tablet 0     Patient Stressors: Financial difficulties   Health problems   Medication change or noncompliance    Patient Strengths: Ability for insight   Treatment Modalities: Medication Management, Group therapy, Case management,  1 to 1 session with clinician, Psychoeducation, Recreational therapy.   Physician Treatment Plan for Primary Diagnosis: Schizoaffective disorder, bipolar type (HCC) Long Term Goal(s): Improvement in symptoms so as ready for discharge   Short Term Goals: Ability to identify changes in lifestyle to reduce recurrence of condition will improve Ability to verbalize feelings will improve Ability to disclose and discuss suicidal ideas Ability to demonstrate self-control will improve Ability to identify and develop effective coping behaviors will improve Ability to maintain clinical  measurements within normal limits will improve Compliance with prescribed medications will improve Ability to identify triggers associated with substance abuse/mental health issues will improve  Medication Management: Evaluate patient's response, side effects, and tolerance of medication regimen.  Therapeutic Interventions: 1 to 1 sessions, Unit Group sessions and Medication administration.  Evaluation of Outcomes: Progressing  Physician Treatment Plan for Secondary Diagnosis: Principal Problem:   Schizoaffective disorder, bipolar type (HCC)  Long Term Goal(s): Improvement in symptoms so as ready for discharge   Short Term Goals: Ability to identify changes in lifestyle to reduce recurrence of condition will improve Ability to verbalize feelings will improve Ability to disclose and discuss suicidal ideas Ability to demonstrate self-control will improve Ability to identify and develop effective coping behaviors will improve Ability to maintain clinical measurements within normal limits will improve Compliance with prescribed medications will improve Ability to identify triggers associated with substance abuse/mental health issues will improve     Medication Management: Evaluate patient's response, side effects, and tolerance of medication regimen.  Therapeutic Interventions: 1 to 1 sessions, Unit Group sessions and Medication administration.  Evaluation of Outcomes: Progressing   RN Treatment Plan for Primary Diagnosis: Schizoaffective disorder, bipolar type (HCC) Long Term Goal(s): Knowledge of disease and therapeutic regimen to maintain health will improve  Short Term Goals: Ability to remain free from injury will improve, Ability to verbalize frustration and anger appropriately will improve, Ability to demonstrate self-control, Ability to participate in decision making will improve, Ability to verbalize feelings will improve, and Compliance with prescribed medications will  improve  Medication Management: RN will administer medications as ordered by provider, will assess and evaluate patient's response and provide education to patient for prescribed medication. RN will report any adverse and/or side effects to prescribing provider.  Therapeutic Interventions: 1 on 1 counseling sessions, Psychoeducation, Medication administration, Evaluate responses to treatment, Monitor vital signs and CBGs as ordered, Perform/monitor CIWA, COWS, AIMS and Fall Risk screenings as ordered, Perform wound care treatments as ordered.  Evaluation of Outcomes: Progressing   LCSW Treatment Plan for Primary Diagnosis: Schizoaffective disorder, bipolar type (HCC) Long Term Goal(s): Safe transition to appropriate next level of care at discharge, Engage patient in therapeutic group addressing interpersonal concerns.  Short Term Goals: Engage patient in aftercare planning with referrals and resources, Increase social support, Increase ability to appropriately verbalize feelings, Increase emotional regulation, Facilitate acceptance of mental health diagnosis and concerns, and Increase skills for wellness and recovery  Therapeutic Interventions: Assess for all discharge needs, 1 to 1 time with Social worker, Explore available resources and support systems, Assess for adequacy in community support network, Educate family and significant other(s) on suicide prevention, Complete Psychosocial Assessment, Interpersonal group therapy.  Evaluation of Outcomes: Progressing  Progress in Treatment: Attending groups: Yes. Participating in groups: Yes. Taking medication as prescribed: Yes. Toleration medication: Yes. Family/Significant other contact made: Yes, individual(s) contacted:  Terria Mathes, son, [phone] Patient understands diagnosis: Yes. and No. Discussing patient identified problems/goals with staff: Yes. Medical problems stabilized or resolved: Yes. Denies suicidal/homicidal  ideation: Yes. Issues/concerns per patient self-inventory: No. Other: none  New problem(s) identified: No, Describe:  none Updates 12/12/22: No changes made at this time Updates 12/17/22: No changes made at this time Update 12/24/22 No changes at this time. Update 12/29/2022: No changes at this time. Update 01/03/23: No changes at this time Update 01/08/23: No changes at this time Update 01/13/23: No changes at this time Update 01/18/23: None at this time. Update 01/23/2023:  No changes at this time. Update 01/28/23: Pt recently diagnosed with Diabetes Type II  Update 02/02/23: None at this time. Update 02/07/23: No changes at this time Update 02/12/23: No changes at this time Update 02/17/23: No changes at this time. Update: 02/22/2023: No changes at this time. Update 02/27/23: No changes at this time Update 03/04/23: No changes at this time  Update 03/09/23: No changes at this time      New Short Term/Long Term Goal(s): Update 6/30: none at this time. Update 09/27/2022:  No changes at this time.  Update 10/02/2022:  No changes at this time. Update 10/07/22: No changes at this time 10/12/22: No changes at this time Update 10/18/22: No changes at this time Update 10/23/22: No changes at this time Update 10/28/22: No changes at this time Update 11/07/22: No changes at this time Update 11/12/22: No changes at this time  Update 11/17/22: None at this time. Update 11/22/22: None at this time. Update 11/27/22 No changes at this time. Update 12/02/22 No changes at this time  Update 12/07/22: None at this time. Updates 12/12/22: No changes made at this time Updates 12/17/22: No changes made at this time Update 12/24/22 No changes at this time. Update 12/29/2022: No changes at this time. Update 01/03/23: No changes at this time Update 01/08/23: No changes at this time Update 01/13/23: No changes at this time   Update 01/18/23: No changes at this time. Update 01/23/2023:  No changes at this time. Update 01/28/2023:  No changes at this time.  Update 02/02/23: None at this time.  Update 02/07/23: No changes at this time Update 02/12/23: No changes at this time Update 02/17/23: No changes at this time. Update: 02/22/2023: No changes at this time. Update 02/27/23: No changes at this time Update 03/04/23: No changes at this time Update 03/09/23: No changes at this time              Patient Goals:  Update 6/30: none at this time. Update 09/27/2022:  No changes at this time. Update 10/02/2022:  No changes at this time. Update 10/07/22: No changes at this time 10/12/22: No changes at this time Update 10/18/22: No changes at this time Update 10/23/22: No changes at this time Update 10/28/22: No changes at this time Update 11/07/22: No changes at this time Update 11/12/22: No changes at this time Update 11/17/22: None at this time. Update 11/22/22: None at this time. Update 11/27/22 No changes at this time Update 12/02/22 No changes at this time   Update 12/07/22: None at this time. Updates 12/12/22: No changes made at this time Updates 12/17/22: No changes made at this time Update 12/24/22 No changes at this time. Update 12/29/2022: No changes at this time. Update 01/03/23:  No changes at this time Update 01/08/23: No changes at this time Update 01/13/23: No changes at this time   Update 01/18/23: No changes at this time. Update 01/23/2023:  No changes at this time.  Update 01/28/2023:  No changes at this time. Update 02/02/23: None at this time.  Update 02/07/23: No changes at this time Update 02/12/23: No changes at this time Update 02/17/23: No changes at this time. Update: 02/22/2023: No changes at this time. Update 02/27/23: No changes at this time Update 03/04/23: No changes at this time  Update 03/09/23: No changes at this time              Discharge Plan or Barriers: Update 6/30: APS report has been made and patient is being investigated for guardianship needs.  Remains homeless with limited supports. Update 09/27/2022:  No changes at this time.  Update 10/02/2022:   Patient remains safe on the unit at this time.  Patient remains psychotic at this time.  APS report has been made, however, no follow up from the caseworker on her case.  No safe discharge identified.  CSW has requested that application for Medicaid and disability be completed.   Update 10/07/22: No changes at this time 10/12/22: No changes at this time Update 10/18/22: No changes at this time Update 10/23/22: No changes at this time Update 10/23/22: No changes at this time Update 10/28/22: According to [facility], pt's caseworker at DSS, the petition was filed for guardianship on 10/21/22, now awaiting for the petition to be approvedUpdate 11/07/22: No changes at this time Update 11/12/22: CSW sent over patients information to [facility] and [facility], awaiting a response. CSW awaiting word from DSS regarding pt's petition and what agency will become her guardian. Update 11/17/22: No changes at this time. Update 11/22/22: CSW has sent patients information to multiple facilities that were suggested by leadership, pt has been denied from the facility or the facility does not respond. CSW continues to send pt's information to nursing homes. Update 11/27/22 CSW contacted DSS to inquire about the status of guardianship. No response at this time Update 12/02/22 Supervisor Loraine Leriche contacted DSS who stated they are not taking pt on for guardianship but that Quincy Carnes will be present tomorrow to do a visit with social worker and pt and that pt's son will be signing documentation for placement for the pt. Update 12/07/22: [facility] DSS has declined to take guardianship. Son, Casimiro Needle is to step into a more present role in deciding placement. A conversation is still needed. Updates 12/12/22: CSW staffed case with leadership and they state that a family meeting must happen with pt's son. CSW contacted pt's financial navigator and her DSS worker so that they may be present on the call and are able to speak to the efforts  they have made to secure pt funding and housing. CSW waiting on responses from both, leadership is aware. Updates 12/17/22: CSW awaiting updates from both [facility] DSS and [facility] financial navigators as to status of pt's applications for Medicaid/[employer] and Disability, respectively. Update 12/24/22 CSW sent email regarding family meeting per request of supervisors. CSW awaiting responses from all parties to schedule family meeting. Update: 12/29/2022 No changes at this time. Update 01/03/23: CSW continues to await responses on the scheduling of the family meeting. CSW received call from Primitivo Gauze at [employer], CSW sent FL2 to [place] to aide in pt's discharge planning. Update 01/08/23: CSW has reached out again to leadership and financial counseling.  CSW continues to await a response. CSW called Sharlee Blew, awaiting a response for status of pt's placement options. Update 01/13/23: Pt is to meet with psychologist tomorrow 10/22 to complete capacity testing so that DSS can retry for guardianship  Update 01/18/23: The patient is still awaiting placement.  Update 01/23/2023:  Patient remains safe on the unit at this time.  CSW team continues to work on placement.  CSW team in contact with DSS to develop plan on guardianship.   Work continues to Hospital doctor. Update 01/28/2023:  Medicaid application remains pending. DSS awaiting records from medical records to retry fo guardianship.  Update 02/02/23: None at this time.  Update 02/07/23: Sharlee Blew at DSS confirms that pt records were received and that they are petitioning for guardianship, she informs CSW that decision should be made on court date by Monday or Tuesday of next week. CSW still awaits status update on Medicaid application Update 02/12/23: Medicaid Application still pending. TOC Supervisors recommend that CSW continue searching for placement as we await DSS taking guardianship. Update 02/17/23: DSS confirms guardianship date  for January 14th, 2025. CSW continue to search for placement. Update: 02/22/2023: No changes at this time.  Update 02/27/23: CSW sent pt's FL2 to multiple different placements. Awaiting to hear back from placements. Update 03/04/23: No changes at this time  Update 03/09/23: No changes at this time            Reason for Continuation of Hospitalization: Hallucinations Mania Medication stabilization   Estimated Length of Stay:  Update 6/30: 1-7 days Update 09/27/2022:  TBD Update 10/02/2022:  No changes at this time. UpdaUpdate 10/18/22: No changes at this time te 10/07/22: No changes at this time Update 10/18/22: No changes at this time Update 10/23/22: No changes at this time Update 10/28/22: No changes at this time Update 11/07/22: No changes at this time Update 11/12/22: No changes at this time  Update 11/17/22: No changes at this time. Update 11/22/22: None at this time. Update 11/27/22 No changes at this time. Update 12/02/22 No changes at this time  Update 12/07/22: None at this time. Updates 12/12/22: No changes made at this time Updates 12/17/22: TBD. Update 12/29/2022: No changes at this time. Update 01/03/23: TBDUpdate 01/08/23: TBD Update 01/13/23: TBD  Update 01/18/23: No changes at this time.  Update 01/23/2023:  TBD Update 01/28/2023:  TBD Update 02/02/23: None at this time. Update 02/07/23: TBD Update 02/12/23: TBD Update 02/17/23: TBD. Update: 02/22/2023: TBD. Update: 02/27/2023: TBD. Update: 03/04/2023: TBD. Update 03/09/23: TBD    Last 3 [place] Suicide Severity Risk Score: Flowsheet Row Admission (Current) from 08/22/2022 in [facility] ED from 08/17/2022 in [facility] Emergency Department at [facility] ED from 08/12/2022 in [facility] Emergency Department at [facility]  C-SSRS RISK CATEGORY Low Risk No Risk No Risk       Last [facility] 2/9 Scores:     No data to display          Scribe for Treatment Team: Marshell Levan, [place] 03/09/2023 11:32 AM

## 2023-03-10 DIAGNOSIS — F250 Schizoaffective disorder, bipolar type: Secondary | ICD-10-CM | POA: Diagnosis not present

## 2023-03-10 LAB — BASIC METABOLIC PANEL
Anion gap: 10 (ref 5–15)
BUN: 12 mg/dL (ref 8–23)
CO2: 21 mmol/L — ABNORMAL LOW (ref 22–32)
Calcium: 9.4 mg/dL (ref 8.9–10.3)
Chloride: 108 mmol/L (ref 98–111)
Creatinine, Ser: 0.88 mg/dL (ref 0.44–1.00)
GFR, Estimated: >60 mL/min (ref >60)
Glucose, Bld: 125 mg/dL — ABNORMAL HIGH (ref 70–99)
Potassium: 4.1 mmol/L (ref 3.5–5.1)
Sodium: 139 mmol/L (ref 135–145)

## 2023-03-10 NOTE — BHH Counselor (Signed)
CSW contacted [facility], ALF/Nursing Home in [place], [place] per suggestion of nursing staff.   CSW sent to VM of admission coordinator Anette Riedel.  CSW left HIPAA compliant VM requesting return call.   Reynaldo Minium, MSW, [place] 03/10/2023 1:59 PM

## 2023-03-10 NOTE — Plan of Care (Signed)

## 2023-03-10 NOTE — BHH Counselor (Signed)
CSW received successful fax report from [place], [place] [phone].   Reynaldo Minium, MSW, [place] 03/10/2023 4:39 PM

## 2023-03-10 NOTE — BHH Counselor (Signed)
CSW received call from Judson Roch at [facility] DSS reporting that pt is declared legal disabled and will now need to apply for Social Security Disability.   Reynaldo Minium, MSW, [place] 03/10/2023 4:24 PM

## 2023-03-10 NOTE — Group Note (Signed)
Recreation Therapy Group Note   Group Topic:Emotion Expression  Group Date: 03/10/2023 Start Time: 1400 End Time: 1450 Facilitators: Rosina Lowenstein, LRT, CTRS Location:  Dayroom  Group Description: Picture a Diplomatic Services operational officer. Patients and LRT discuss what it means to be "at peace", what it feels like physically and mentally. Pts are given a canvas and oil pastels to use and encouraged to draw their idea of a peaceful place. Pts and LRT discuss how they use this in their daily life post discharge. Pts are encouraged to take their canvas home with them as a reminder of their peaceful place whenever they are feeling depressed, anxious, etc.    Goal Area(s) Addressed:  Patient will identify what it means to experience a "peaceful" emotion. Patient will identify a new coping skill.  Patient will express their emotions through art. Patients will increase communication by talking with LRT and peers while in group.   Affect/Mood: Appropriate   Participation Level: Active and Engaged   Participation Quality: Independent   Behavior: Appropriate, Calm, and Cooperative   Speech/Thought Process: Loose association   Insight: Fair   Judgement: Fair    Modes of Intervention: Art, Education, and Guided Discussion   Patient Response to Interventions:  Attentive, Engaged, and Receptive   Education Outcome:  Acknowledges education   Clinical Observations/Individualized Feedback: Betty Henderson was active in their participation of session activities and group discussion. Pt identified "silent night" as her peaceful place. Pt used oil pastels and drew a beach with the moon and stars above it. Pt interacted well with LRT and peers duration of session.    Plan: Continue to engage patient in RT group sessions 2-3x/week.   Rosina Lowenstein, LRT, CTRS 03/10/2023 4:37 PM

## 2023-03-10 NOTE — BHH Counselor (Signed)
CSW faxed referral to Ms.Tammy at [facility] in [place] [phone] consideration for ALF per suggestion of CSW [place].    Reynaldo Minium, MSW, [place] 03/10/2023 4:38 PM

## 2023-03-10 NOTE — Progress Notes (Signed)
[facility] MD Progress Note  Betty Henderson  MRN:  [id number]  Subjective: Case discussed in multidisciplinary meeting today, chart reviewed, patient seen today during rounds.  Social worker informed that patient has been assigned a guardian via court, the final/official  court hearing is on Jan 14th, 2025  Chart reviewed, case discussed in multidisciplinary team today, patient seen during rounds. No new acute events overnight.  Patient reports she is doing "fine".  Her appetite is fine.  Patient was encouraged to keep attending groups.  Patient denies thoughts of harming herself or others.    Principal Problem: Schizoaffective disorder, bipolar type (HCC) Diagnosis: Principal Problem:   Schizoaffective disorder, bipolar type (HCC)   Past Psychiatric History: Schizoaffective disorder, bipolar type.  Past Medical History:  Past Medical History:  Diagnosis Date   Anemia    Arthritis    Chronic pain    Drug-seeking behavior    Malingering    Osteopetrosis    Psychosis (HCC)    Schizoaffective disorder, bipolar type (HCC)     Past Surgical History:  Procedure Laterality Date   MOUTH SURGERY     TUBAL LIGATION     Family History:  Family History  Family history unknown: Yes   Family Psychiatric  History: Unremarkable Social History:  Social History   Substance and Sexual Activity  Alcohol Use Not Currently   Comment: 1 cocktail 3 weeks ago     Social History   Substance and Sexual Activity  Drug Use Not Currently   Types: Cocaine   Comment: states "it's legal though"    Social History   Socioeconomic History   Marital status: Single    Spouse name: Not on file   Number of children: Not on file   Years of education: Not on file   Highest education level: Not on file  Occupational History   Not on file  Tobacco Use   Smoking status: Every Day    Current packs/day: 0.50    Types: Cigarettes   Smokeless tobacco: Never  Vaping Use   Vaping status: Never Used   Substance and Sexual Activity   Alcohol use: Not Currently    Comment: 1 cocktail 3 weeks ago   Drug use: Not Currently    Types: Cocaine    Comment: states "it's legal though"   Sexual activity: Never  Other Topics Concern   Not on file  Social History Narrative   Not on file   Social Drivers of Health   Financial Resource Strain: Not on file  Food Insecurity: Food Insecurity Present (08/22/2022)   Hunger Vital Sign    Worried About Running Out of Food in the Last Year: Sometimes true    Ran Out of Food in the Last Year: Sometimes true  Transportation Needs: Patient Unable To Answer (08/22/2022)   PRAPARE - Transportation    Lack of Transportation (Medical): Patient unable to answer    Lack of Transportation (Non-Medical): Patient unable to answer  Recent Concern: Transportation Needs - Unmet Transportation Needs (06/13/2022)   Received from [facility]   [facility] - Transportation    Lack of Transportation (Medical): Not on file    Lack of Transportation (Non-Medical): Yes  Physical Activity: Not on file  Stress: Not on file  Social Connections: Unknown (08/06/2021)   Received from [facility], [facility]   Social Network    Social Network: Not on file   Additional Social History:     Patient is homeless at this  time.  Child psychotherapist is working with DSS on [employer] application and  placement.                    Sleep Good  Appetite:  Good  Current Medications: Current Facility-Administered Medications  Medication Dose Route Frequency Provider Last Rate Last Admin   acetaminophen (TYLENOL) tablet 650 mg  650 mg Oral Q6H PRN [name], DO   650 mg at 03/08/23 1814   alum & mag hydroxide-simeth (MAALOX/MYLANTA) 200-200-20 MG/5ML suspension 30 mL  30 mL Oral Q4H PRN [name], DO   30 mL at 02/28/23 0153   aspirin EC tablet 81 mg  81 mg Oral Daily [name], MD   81 mg at 03/10/23 1006   atorvastatin (LIPITOR)  tablet 20 mg  20 mg Oral Daily [name], MD   20 mg at 03/10/23 1006   diphenhydrAMINE (BENADRYL) capsule 50 mg  50 mg Oral Q6H PRN [name], DO   50 mg at 09/29/22 2254   Or   diphenhydrAMINE (BENADRYL) injection 50 mg  50 mg Intramuscular Q6H PRN [name], DO       docusate sodium (COLACE) capsule 100 mg  100 mg Oral BID [name], MD   100 mg at 03/10/23 1009   glimepiride (AMARYL) tablet 8 mg  8 mg Oral Q breakfast [name], MD   8 mg at 03/10/23 1005   haloperidol (HALDOL) tablet 5 mg  5 mg Oral Q6H PRN [name], DO   5 mg at 09/28/22 2149   Or   haloperidol lactate (HALDOL) injection 5 mg  5 mg Intramuscular Q6H PRN [name], DO       ibuprofen (ADVIL) tablet 600 mg  600 mg Oral Q6H PRN [name], [name], MD   600 mg at 03/05/23 1610   linagliptin (TRADJENTA) tablet 5 mg  5 mg Oral Daily [name], MD   5 mg at 03/10/23 1007   LORazepam (ATIVAN) tablet 1 mg  1 mg Oral TID PRN [name], DO   1 mg at 02/24/23 2139   magnesium hydroxide (MILK OF MAGNESIA) suspension 30 mL  30 mL Oral Daily PRN [name], DO   30 mL at 03/04/23 1149   metFORMIN (GLUCOPHAGE) tablet 1,000 mg  1,000 mg Oral BH-q8a4p [name], DO   1,000 mg at 03/10/23 1550   ondansetron (ZOFRAN) tablet 4 mg  4 mg Oral Q4H PRN [name], MD   4 mg at 02/27/23 2016   oxybutynin (DITROPAN) tablet 5 mg  5 mg Oral BID [name], MD   5 mg at 03/10/23 1007   paliperidone (INVEGA SUSTENNA) injection 156 mg  156 mg Intramuscular Q28 days [name], MD   156 mg at 02/12/23 1010   pantoprazole (PROTONIX) EC tablet 80 mg  80 mg Oral Daily [name], DO   80 mg at 03/10/23 1004   topiramate (TOPAMAX) tablet 25 mg  25 mg Oral BH-q8a4p [name], DO   25 mg at 03/10/23 1550   traZODone (DESYREL) tablet 50 mg  50 mg Oral QHS PRN [name], DO   50 mg at  03/09/23 2122   zonisamide (ZONEGRAN) capsule 100 mg  100 mg Oral QHS [name], DO   100 mg at 03/09/23 2122    Lab Results:  Results for orders placed or performed during the hospital encounter  of 08/22/22 (from the past 48 hours)  Basic metabolic panel     Status: Abnormal   Collection Time: 03/10/23  2:57 PM  Result Value Ref Range   Sodium 139 135 - 145 mmol/L   Potassium 4.1 3.5 - 5.1 mmol/L   Chloride 108 98 - 111 mmol/L   CO2 21 (L) 22 - 32 mmol/L   Glucose, Bld 125 (H) 70 - 99 mg/dL    Comment: Glucose reference range applies only to samples taken after fasting for at least 8 hours.   BUN 12 8 - 23 mg/dL   Creatinine, Ser 1.61 0.44 - 1.00 mg/dL   Calcium 9.4 8.9 - 09.6 mg/dL   GFR, Estimated >04 >54 mL/min    Comment: (NOTE) Calculated using the CKD-EPI Creatinine Equation (2021)    Anion gap 10 5 - 15    Comment: Performed at [facility], [address] [place], [place] [address]       Blood Alcohol level:  Lab Results  Component Value Date   [facility] <10 08/17/2022   ETH <10 01/11/2021      Musculoskeletal: Strength & Muscle Tone: within normal limits Gait & Station: normal Patient leans: N/A   Psychiatric Specialty Exam:   Presentation  General Appearance:  Casual; Neat   Eye Contact: Fair   Speech: Spontaneous   Speech Volume: Normal   Handedness: Right     Mood and Affect  Mood: "Fine"   Affect: Stable and pleasant    Thought Processes: Improved, at baseline   Descriptions of Associations: Intact   Orientation: Well-oriented   Thought Content: Improved, at baseline  Hallucinations:Denies  Ideas of Reference:None noted   Suicidal Thoughts:Denies SI, no intention or plan  Homicidal Thoughts:Denies HI, no intention or plan   Sensorium  Memory: Immediate Fair; Remote Poor   Judgment: Fair, pt is Rx compliant   Insight: Improved     Executive Functions  Concentration: Improved    Attention Span: Improved   Language: Fair     Psychomotor Activity  Psychomotor Activity: Normal  Assets  Assets: Communication Skills     Sleep  Sleep:Improved     Physical Exam: Physical Exam Vitals and nursing note reviewed.  Constitutional:      Appearance: Normal appearance. She is normal weight.  Neurological:     General: No focal deficit present.     Mental Status: She is alert.      Review of Systems  Constitutional: Negative.   HENT: Negative.    Eyes: Negative.   Respiratory: Negative.    Cardiovascular: Negative.   Gastrointestinal: Negative.   Genitourinary: Negative.   Musculoskeletal: Negative.   Skin: Negative.   Neurological: Negative.   Endo/Heme/Allergies: Negative.     Blood pressure 110/85, pulse 81, temperature 97.9 F (36.6 C), resp. rate 18, height 5\' 6"  (1.676 m), weight 86 kg, SpO2 99%. Body mass index is 30.59 kg/m.   Treatment Plan Summary: Daily contact with patient to assess and evaluate symptoms and progress in treatment, Medication management, and Plan continue current medications.   Continue to monitor patient on as needed meds   Patient received  dose of Invega Sustenna 156 mg IM on  02/12/23 Oxybutynin 5 mg by mouth twice daily to help with urinary incontinence Continue on Metformin and Amaryl.   Continue on atorvastatin for Mixed hyperlipidemia  Patient is awaiting placement    [name], MD

## 2023-03-11 DIAGNOSIS — F250 Schizoaffective disorder, bipolar type: Secondary | ICD-10-CM | POA: Diagnosis not present

## 2023-03-11 NOTE — Progress Notes (Signed)
   03/11/23 0100  Psych Admission Type (Psych Patients Only)  Admission Status Voluntary  Psychosocial Assessment  Patient Complaints Sadness  Eye Contact Fair  Facial Expression Sad  Affect Appropriate to circumstance  Speech Logical/coherent  Interaction Minimal  Motor Activity Slow  Appearance/Hygiene Layered clothes  Behavior Characteristics Cooperative  Mood Pleasant  Thought Process  Coherency WDL  Content WDL  Delusions None reported or observed  Perception Hallucinations  Hallucination Auditory  Judgment Impaired  Confusion None  Danger to Self  Current suicidal ideation? Denies  Agreement Not to Harm Self Yes  Description of Agreement verbal  Danger to Others  Danger to Others None reported or observed  Danger to Others Abnormal  Harmful Behavior to others No threats or harm toward other people  Destructive Behavior No threats or harm toward property

## 2023-03-11 NOTE — Progress Notes (Signed)
   03/11/23 2300  Psych Admission Type (Psych Patients Only)  Admission Status Voluntary  Psychosocial Assessment  Patient Complaints Depression  Eye Contact Fair  Facial Expression Flat  Affect Depressed  Speech Logical/coherent  Interaction Minimal  Motor Activity Slow  Appearance/Hygiene In scrubs;Layered clothes  Behavior Characteristics Cooperative  Mood Pleasant  Thought Process  Coherency WDL  Content WDL  Delusions None reported or observed  Perception WDL  Hallucination Auditory  Judgment Impaired  Confusion None  Danger to Self  Current suicidal ideation? Denies  Agreement Not to Harm Self Yes  Description of Agreement verbal  Danger to Others  Danger to Others None reported or observed  Danger to Others Abnormal  Harmful Behavior to others No threats or harm toward other people  Destructive Behavior No threats or harm toward property

## 2023-03-11 NOTE — Progress Notes (Signed)
   03/11/23 1100  Psych Admission Type (Psych Patients Only)  Admission Status Voluntary  Psychosocial Assessment  Patient Complaints Depression  Eye Contact Fair  Facial Expression Flat  Affect Depressed  Speech Logical/coherent  Interaction Isolative  Motor Activity Slow  Appearance/Hygiene Disheveled  Behavior Characteristics Cooperative  Mood Pleasant  Thought Process  Coherency WDL  Content WDL  Delusions None reported or observed  Perception WDL  Hallucination Auditory  Judgment Impaired  Confusion None  Danger to Self  Current suicidal ideation? Denies  Danger to Others  Danger to Others None reported or observed  Danger to Others Abnormal  Harmful Behavior to others No threats or harm toward other people

## 2023-03-11 NOTE — Progress Notes (Signed)
[facility] MD Progress Note  03/11/2023 11:39 AM Betty Henderson  MRN:  [id number] Subjective: [name] is seen on rounds.  Social work informed me that adult protective services is working on getting her Medicare and therefore would be able to be placed in assisted living.  She is doing well on her medications.  No side effects.  No problems.  She has been in good controls. Principal Problem: Schizoaffective disorder, bipolar type (HCC) Diagnosis: Principal Problem:   Schizoaffective disorder, bipolar type (HCC)  Total Time spent with patient: 15 minutes  Past Psychiatric History: Schizoaffective disorder  Past Medical History:  Past Medical History:  Diagnosis Date   Anemia    Arthritis    Chronic pain    Drug-seeking behavior    Malingering    Osteopetrosis    Psychosis (HCC)    Schizoaffective disorder, bipolar type (HCC)     Past Surgical History:  Procedure Laterality Date   MOUTH SURGERY     TUBAL LIGATION     Family History:  Family History  Family history unknown: Yes   Family Psychiatric  History: Unremarkable Social History:  Social History   Substance and Sexual Activity  Alcohol Use Not Currently   Comment: 1 cocktail 3 weeks ago     Social History   Substance and Sexual Activity  Drug Use Not Currently   Types: Cocaine   Comment: states "it's legal though"    Social History   Socioeconomic History   Marital status: Single    Spouse name: Not on file   Number of children: Not on file   Years of education: Not on file   Highest education level: Not on file  Occupational History   Not on file  Tobacco Use   Smoking status: Every Day    Current packs/day: 0.50    Types: Cigarettes   Smokeless tobacco: Never  Vaping Use   Vaping status: Never Used  Substance and Sexual Activity   Alcohol use: Not Currently    Comment: 1 cocktail 3 weeks ago   Drug use: Not Currently    Types: Cocaine    Comment: states "it's legal though"   Sexual activity:  Never  Other Topics Concern   Not on file  Social History Narrative   Not on file   Social Drivers of Health   Financial Resource Strain: Not on file  Food Insecurity: Food Insecurity Present (08/22/2022)   Hunger Vital Sign    Worried About Running Out of Food in the Last Year: Sometimes true    Ran Out of Food in the Last Year: Sometimes true  Transportation Needs: Patient Unable To Answer (08/22/2022)   PRAPARE - Transportation    Lack of Transportation (Medical): Patient unable to answer    Lack of Transportation (Non-Medical): Patient unable to answer  Recent Concern: Transportation Needs - Unmet Transportation Needs (06/13/2022)   Received from [facility], [facility]   [facility] - Transportation    Lack of Transportation (Medical): Not on file    Lack of Transportation (Non-Medical): Yes  Physical Activity: Not on file  Stress: Not on file  Social Connections: Unknown (08/06/2021)   Received from [facility], [facility]   Social Network    Social Network: Not on file   Additional Social History:                         Sleep: Good  Appetite:  Good  Current Medications: Current Facility-Administered  Medications  Medication Dose Route Frequency Provider Last Rate Last Admin   acetaminophen (TYLENOL) tablet 650 mg  650 mg Oral Q6H PRN [name], DO   650 mg at 03/11/23 4098   alum & mag hydroxide-simeth (MAALOX/MYLANTA) 200-200-20 MG/5ML suspension 30 mL  30 mL Oral Q4H PRN [name], DO   30 mL at 02/28/23 0153   aspirin EC tablet 81 mg  81 mg Oral Daily [name], MD   81 mg at 03/11/23 1191   atorvastatin (LIPITOR) tablet 20 mg  20 mg Oral Daily [name], MD   20 mg at 03/11/23 4782   diphenhydrAMINE (BENADRYL) capsule 50 mg  50 mg Oral Q6H PRN [name], DO   50 mg at 09/29/22 2254   Or   diphenhydrAMINE (BENADRYL) injection 50 mg  50 mg Intramuscular Q6H PRN [name], DO        docusate sodium (COLACE) capsule 100 mg  100 mg Oral BID [name], MD   100 mg at 03/11/23 9562   glimepiride (AMARYL) tablet 8 mg  8 mg Oral Q breakfast [name], MD   8 mg at 03/11/23 1308   haloperidol (HALDOL) tablet 5 mg  5 mg Oral Q6H PRN [name], DO   5 mg at 09/28/22 2149   Or   haloperidol lactate (HALDOL) injection 5 mg  5 mg Intramuscular Q6H PRN [name], DO       ibuprofen (ADVIL) tablet 600 mg  600 mg Oral Q6H PRN [name], [name], MD   600 mg at 03/05/23 6578   linagliptin (TRADJENTA) tablet 5 mg  5 mg Oral Daily [name], MD   5 mg at 03/11/23 4696   LORazepam (ATIVAN) tablet 1 mg  1 mg Oral TID PRN [name], DO   1 mg at 02/24/23 2139   magnesium hydroxide (MILK OF MAGNESIA) suspension 30 mL  30 mL Oral Daily PRN [name], DO   30 mL at 03/04/23 1149   metFORMIN (GLUCOPHAGE) tablet 1,000 mg  1,000 mg Oral BH-q8a4p [name], DO   1,000 mg at 03/11/23 2952   ondansetron (ZOFRAN) tablet 4 mg  4 mg Oral Q4H PRN [name], MD   4 mg at 02/27/23 2016   oxybutynin (DITROPAN) tablet 5 mg  5 mg Oral BID [name], MD   5 mg at 03/11/23 8413   paliperidone (INVEGA SUSTENNA) injection 156 mg  156 mg Intramuscular Q28 days [name], MD   156 mg at 02/12/23 1010   pantoprazole (PROTONIX) EC tablet 80 mg  80 mg Oral Daily [name], DO   80 mg at 03/11/23 2440   topiramate (TOPAMAX) tablet 25 mg  25 mg Oral BH-q8a4p [name], DO   25 mg at 03/11/23 1027   traZODone (DESYREL) tablet 50 mg  50 mg Oral QHS PRN [name], DO   50 mg at 03/10/23 2139   zonisamide (ZONEGRAN) capsule 100 mg  100 mg Oral QHS [name], DO   100 mg at 03/10/23 2139    Lab Results:  Results for orders placed or performed during the hospital encounter of 08/22/22 (from the past 48 hours)  Basic metabolic panel     Status: Abnormal   Collection  Time: 03/10/23  2:57 PM  Result Value Ref Range   Sodium 139 135 - 145 mmol/L   Potassium 4.1 3.5 -  5.1 mmol/L   Chloride 108 98 - 111 mmol/L   CO2 21 (L) 22 - 32 mmol/L   Glucose, Bld 125 (H) 70 - 99 mg/dL    Comment: Glucose reference range applies only to samples taken after fasting for at least 8 hours.   BUN 12 8 - 23 mg/dL   Creatinine, Ser 1.61 0.44 - 1.00 mg/dL   Calcium 9.4 8.9 - 09.6 mg/dL   GFR, Estimated >04 >54 mL/min    Comment: (NOTE) Calculated using the CKD-EPI Creatinine Equation (2021)    Anion gap 10 5 - 15    Comment: Performed at [facility], [address] [place], [place] [address]    Blood Alcohol level:  Lab Results  Component Value Date   [facility] <10 08/17/2022   ETH <10 01/11/2021    Metabolic Disorder Labs: Lab Results  Component Value Date   HGBA1C 11.0 (H) 01/27/2023   MPG 269 01/27/2023   No results found for: "PROLACTIN" Lab Results  Component Value Date   CHOL 259 (H) 01/27/2023   TRIG 155 (H) 01/27/2023   HDL 65 01/27/2023   CHOLHDL 4.0 01/27/2023   VLDL 31 01/27/2023   LDLCALC 163 (H) 01/27/2023    Physical Findings: AIMS:  , ,  ,  ,    CIWA:    COWS:     Musculoskeletal: Strength & Muscle Tone: within normal limits Gait & Station: normal Patient leans: N/A  Psychiatric Specialty Exam:  Presentation  General Appearance:  Casual; Neat  Eye Contact: Fair  Speech: Slow; Slurred  Speech Volume: Decreased  Handedness: Right   Mood and Affect  Mood: Anxious  Affect: Inappropriate   Thought Process  Thought Processes: Disorganized  Descriptions of Associations:Loose  Orientation:Partial  Thought Content:Illogical; Rumination  History of Schizophrenia/Schizoaffective disorder:No data recorded Duration of Psychotic Symptoms:No data recorded Hallucinations:No data recorded Ideas of Reference:Paranoia  Suicidal Thoughts:No data recorded Homicidal Thoughts:No data recorded  Sensorium   Memory: Immediate Fair; Remote Poor  Judgment: Poor  Insight: Poor   Executive Functions  Concentration: Poor  Attention Span: Fair  Recall: [employer] of Knowledge: Fair  Language: Fair   Psychomotor Activity  Psychomotor Activity:No data recorded  Assets  Assets: Communication Skills   Sleep  Sleep:No data recorded    Blood pressure (!) 96/49, pulse 65, temperature (!) 97.5 F (36.4 C), resp. rate 18, height 5\' 6"  (1.676 m), weight 86 kg, SpO2 99%. Body mass index is 30.59 kg/m.   Treatment Plan Summary: Daily contact with patient to assess and evaluate symptoms and progress in treatment, Medication management, and Plan continue current medication.  [name], DO 03/11/2023, 11:39 AM

## 2023-03-11 NOTE — Plan of Care (Signed)

## 2023-03-11 NOTE — Group Note (Signed)
Recreation Therapy Group Note   Group Topic:General Recreation  Group Date: 03/11/2023 Start Time: 1400 End Time: 1500 Facilitators: Rosina Lowenstein, LRT, CTRS Location: Courtyard  Group Description: Outdoor Recreation. Patients had the option to play corn hole, ring toss, bowling or listening to music while outside in the courtyard getting fresh air and sunlight. Marland Kitchen LRT and patients discussed things that they enjoy doing in their free time outside of the hospital. LRT encouraged patients to drink water after being active and getting their heart rate up.   Goal Area(s) Addressed: Patient will identify leisure interests.  Patient will practice healthy decision making. Patient will engage in recreation activity.  Affect/Mood: Appropriate   Participation Level: Active and Engaged   Participation Quality: Independent   Behavior: Calm and Cooperative   Speech/Thought Process: Coherent   Insight: Good   Judgement: Good   Modes of Intervention: Exploration, Guided Discussion, and Rapport Building   Patient Response to Interventions:  Attentive, Engaged, Interested , and Receptive   Education Outcome:  Acknowledges education   Clinical Observations/Individualized Feedback: Betty Henderson was active in their participation of session activities and group discussion. Pt identified "theres still a light" regarding her being discharged. Pt requested songs to be heard while outside. Pt interacted well with LRT and peers duration of session.   Plan: Continue to engage patient in RT group sessions 2-3x/week.   Rosina Lowenstein, LRT, CTRS 03/11/2023 4:58 PM

## 2023-03-11 NOTE — Plan of Care (Signed)
  Problem: Education: Goal: Knowledge of General Education information will improve Description: Including pain rating scale, medication(s)/side effects and non-pharmacologic comfort measures Outcome: Progressing   Problem: Health Behavior/Discharge Planning: Goal: Ability to manage health-related needs will improve Outcome: Progressing   Problem: Clinical Measurements: Goal: Ability to maintain clinical measurements within normal limits will improve Outcome: Progressing Goal: Diagnostic test results will improve Outcome: Progressing   Problem: Nutrition: Goal: Adequate nutrition will be maintained Outcome: Progressing   Problem: Coping: Goal: Level of anxiety will decrease Outcome: Progressing   Problem: Elimination: Goal: Will not experience complications related to bowel motility Outcome: Progressing Goal: Will not experience complications related to urinary retention Outcome: Progressing   Problem: Pain Managment: Goal: General experience of comfort will improve Outcome: Progressing   Problem: Safety: Goal: Ability to remain free from injury will improve Outcome: Progressing   Problem: Skin Integrity: Goal: Risk for impaired skin integrity will decrease Outcome: Progressing

## 2023-03-12 DIAGNOSIS — F250 Schizoaffective disorder, bipolar type: Secondary | ICD-10-CM | POA: Diagnosis not present

## 2023-03-12 MED ORDER — TOPIRAMATE 25 MG PO TABS
50.0000 mg | ORAL_TABLET | ORAL | Status: DC
  Administered 2023-03-12 – 2023-03-28 (×32): 50 mg via ORAL
  Filled 2023-03-12 (×33): qty 2

## 2023-03-12 NOTE — Progress Notes (Signed)
   03/12/23 0555  15 Minute Checks  Location Bedroom  Visual Appearance Calm  Behavior Sleeping  Sleep (Behavioral Health Patients Only)  Calculate sleep? (Click Yes once per 24 hr at 0600 safety check) Yes  Documented sleep last 24 hours 14

## 2023-03-12 NOTE — Plan of Care (Signed)
  Problem: Education: Goal: Knowledge of General Education information will improve Description Including pain rating scale, medication(s)/side effects and non-pharmacologic comfort measures Outcome: Progressing   Problem: Health Behavior/Discharge Planning: Goal: Ability to manage health-related needs will improve Outcome: Progressing   

## 2023-03-12 NOTE — Progress Notes (Addendum)
Patient received Invega sustenna 156mg  today in the right ventrogluteal site. Tolerated procedure well

## 2023-03-12 NOTE — Group Note (Signed)
Date:  03/12/2023 Time:  5:24 PM  Group Topic/Focus:  Making Healthy Choices:   The focus of this group is to help patients identify negative/unhealthy choices they were using prior to admission and identify positive/healthier coping strategies to replace them upon discharge.    Participation Level:  Active  Participation Quality:  Appropriate  Affect:  Appropriate  Cognitive:  Appropriate  Insight: Appropriate  Engagement in Group:  Engaged  Modes of Intervention:  Discussion  Ardelle Anton 03/12/2023, 5:24 PM

## 2023-03-12 NOTE — Progress Notes (Signed)
Patient admitted IVC on Aug 22, 2022 for worsening psychosis. She is now a voluntary admssion.   She denies SI/HI. Patient admits to responding to internal stimuli but no unsafe behaviors noted. Support provided. Patient attended group and was actively involved.  Patient received her monthly Invega injection without any concerns.  Q15 minute unit checks in place

## 2023-03-12 NOTE — Group Note (Signed)
Date:  03/12/2023 Time:  8:37 PM  Group Topic/Focus:  Coping With Mental Health Crisis:   The purpose of this group is to help patients identify strategies for coping with mental health crisis.  Group discusses possible causes of crisis and ways to manage them effectively.    Participation Level:  Minimal  Participation Quality:  Appropriate  Affect:  Appropriate  Cognitive:  Appropriate  Insight: Appropriate  Engagement in Group:  Engaged  Modes of Intervention:  Education  Additional Comments:    [name] 03/12/2023, 8:37 PM

## 2023-03-12 NOTE — Progress Notes (Signed)
[facility] MD Progress Note  03/12/2023 11:40 AM Betty Henderson  MRN:  [id number] Subjective: Betty Henderson is seen on rounds.  No changes.  Social work is working with DSS on getting her placement.  She has been compliant with medications and no problems.  She has been in good controls on the unit.  No side effects from her medication. Principal Problem: Schizoaffective disorder, bipolar type (HCC) Diagnosis: Principal Problem:   Schizoaffective disorder, bipolar type (HCC)  Total Time spent with patient: 15 minutes  Past Psychiatric History: Schizoaffective disorder  Past Medical History:  Past Medical History:  Diagnosis Date   Anemia    Arthritis    Chronic pain    Drug-seeking behavior    Malingering    Osteopetrosis    Psychosis (HCC)    Schizoaffective disorder, bipolar type (HCC)     Past Surgical History:  Procedure Laterality Date   MOUTH SURGERY     TUBAL LIGATION     Family History:  Family History  Family history unknown: Yes   Family Psychiatric  History: Unremarkable Social History:  Social History   Substance and Sexual Activity  Alcohol Use Not Currently   Comment: 1 cocktail 3 weeks ago     Social History   Substance and Sexual Activity  Drug Use Not Currently   Types: Cocaine   Comment: states "it's legal though"    Social History   Socioeconomic History   Marital status: Single    Spouse name: Not on file   Number of children: Not on file   Years of education: Not on file   Highest education level: Not on file  Occupational History   Not on file  Tobacco Use   Smoking status: Every Day    Current packs/day: 0.50    Types: Cigarettes   Smokeless tobacco: Never  Vaping Use   Vaping status: Never Used  Substance and Sexual Activity   Alcohol use: Not Currently    Comment: 1 cocktail 3 weeks ago   Drug use: Not Currently    Types: Cocaine    Comment: states "it's legal though"   Sexual activity: Never  Other Topics Concern   Not on file   Social History Narrative   Not on file   Social Drivers of Health   Financial Resource Strain: Not on file  Food Insecurity: Food Insecurity Present (08/22/2022)   Hunger Vital Sign    Worried About Running Out of Food in the Last Year: Sometimes true    Ran Out of Food in the Last Year: Sometimes true  Transportation Needs: Patient Unable To Answer (08/22/2022)   PRAPARE - Transportation    Lack of Transportation (Medical): Patient unable to answer    Lack of Transportation (Non-Medical): Patient unable to answer  Recent Concern: Transportation Needs - Unmet Transportation Needs (06/13/2022)   Received from [facility], [facility]   [facility] - Transportation    Lack of Transportation (Medical): Not on file    Lack of Transportation (Non-Medical): Yes  Physical Activity: Not on file  Stress: Not on file  Social Connections: Unknown (08/06/2021)   Received from [facility], [facility]   Social Network    Social Network: Not on file   Additional Social History:                         Sleep: Good  Appetite:  Good  Current Medications: Current Facility-Administered Medications  Medication Dose Route Frequency Provider  Last Rate Last Admin   acetaminophen (TYLENOL) tablet 650 mg  650 mg Oral Q6H PRN [name], DO   650 mg at 03/11/23 4401   alum & mag hydroxide-simeth (MAALOX/MYLANTA) 200-200-20 MG/5ML suspension 30 mL  30 mL Oral Q4H PRN [name], DO   30 mL at 02/28/23 0153   aspirin EC tablet 81 mg  81 mg Oral Daily [name], MD   81 mg at 03/12/23 1020   atorvastatin (LIPITOR) tablet 20 mg  20 mg Oral Daily [name], MD   20 mg at 03/12/23 1020   diphenhydrAMINE (BENADRYL) capsule 50 mg  50 mg Oral Q6H PRN [name], DO   50 mg at 09/29/22 2254   Or   diphenhydrAMINE (BENADRYL) injection 50 mg  50 mg Intramuscular Q6H PRN [name], DO       glimepiride (AMARYL) tablet 8 mg  8 mg Oral Q  breakfast [name], MD   8 mg at 03/12/23 0272   haloperidol (HALDOL) tablet 5 mg  5 mg Oral Q6H PRN [name], DO   5 mg at 09/28/22 2149   Or   haloperidol lactate (HALDOL) injection 5 mg  5 mg Intramuscular Q6H PRN [name], DO       ibuprofen (ADVIL) tablet 600 mg  600 mg Oral Q6H PRN [name], [name], MD   600 mg at 03/05/23 5366   linagliptin (TRADJENTA) tablet 5 mg  5 mg Oral Daily [name], MD   5 mg at 03/12/23 1020   LORazepam (ATIVAN) tablet 1 mg  1 mg Oral TID PRN [name], DO   1 mg at 02/24/23 2139   magnesium hydroxide (MILK OF MAGNESIA) suspension 30 mL  30 mL Oral Daily PRN [name], DO   30 mL at 03/04/23 1149   metFORMIN (GLUCOPHAGE) tablet 1,000 mg  1,000 mg Oral BH-q8a4p [name], DO   1,000 mg at 03/12/23 0811   ondansetron (ZOFRAN) tablet 4 mg  4 mg Oral Q4H PRN [name], MD   4 mg at 02/27/23 2016   oxybutynin (DITROPAN) tablet 5 mg  5 mg Oral BID [name], MD   5 mg at 03/12/23 1020   paliperidone (INVEGA SUSTENNA) injection 156 mg  156 mg Intramuscular Q28 days [name], MD   156 mg at 02/12/23 1010   pantoprazole (PROTONIX) EC tablet 80 mg  80 mg Oral Daily [name], DO   80 mg at 03/12/23 1020   topiramate (TOPAMAX) tablet 25 mg  25 mg Oral BH-q8a4p [name], DO   25 mg at 03/12/23 4403   traZODone (DESYREL) tablet 50 mg  50 mg Oral QHS PRN [name], DO   50 mg at 03/11/23 2121   zonisamide (ZONEGRAN) capsule 100 mg  100 mg Oral QHS [name], DO   100 mg at 03/11/23 2121    Lab Results:  Results for orders placed or performed during the hospital encounter of 08/22/22 (from the past 48 hours)  Basic metabolic panel     Status: Abnormal   Collection Time: 03/10/23  2:57 PM  Result Value Ref Range   Sodium 139 135 - 145 mmol/L   Potassium 4.1 3.5 - 5.1 mmol/L   Chloride 108 98 - 111 mmol/L   CO2 21  (L) 22 - 32 mmol/L   Glucose, Bld 125 (H) 70 - 99 mg/dL  Comment: Glucose reference range applies only to samples taken after fasting for at least 8 hours.   BUN 12 8 - 23 mg/dL   Creatinine, Ser 7.25 0.44 - 1.00 mg/dL   Calcium 9.4 8.9 - 36.6 mg/dL   GFR, Estimated >44 >03 mL/min    Comment: (NOTE) Calculated using the CKD-EPI Creatinine Equation (2021)    Anion gap 10 5 - 15    Comment: Performed at [facility], [address] [place], [place] [address]    Blood Alcohol level:  Lab Results  Component Value Date   [facility] <10 08/17/2022   ETH <10 01/11/2021    Metabolic Disorder Labs: Lab Results  Component Value Date   HGBA1C 11.0 (H) 01/27/2023   MPG 269 01/27/2023   No results found for: "PROLACTIN" Lab Results  Component Value Date   CHOL 259 (H) 01/27/2023   TRIG 155 (H) 01/27/2023   HDL 65 01/27/2023   CHOLHDL 4.0 01/27/2023   VLDL 31 01/27/2023   LDLCALC 163 (H) 01/27/2023    Physical Findings: AIMS:  , ,  ,  ,    CIWA:    COWS:     Musculoskeletal: Strength & Muscle Tone: within normal limits Gait & Station: normal Patient leans: N/A  Psychiatric Specialty Exam:  Presentation  General Appearance:  Casual; Neat  Eye Contact: Fair  Speech: Slow; Slurred  Speech Volume: Decreased  Handedness: Right   Mood and Affect  Mood: Anxious  Affect: Inappropriate   Thought Process  Thought Processes: Disorganized  Descriptions of Associations:Loose  Orientation:Partial  Thought Content:Illogical; Rumination  History of Schizophrenia/Schizoaffective disorder:No data recorded Duration of Psychotic Symptoms:No data recorded Hallucinations:No data recorded Ideas of Reference:Paranoia  Suicidal Thoughts:No data recorded Homicidal Thoughts:No data recorded  Sensorium  Memory: Immediate Fair; Remote Poor  Judgment: Poor  Insight: Poor   Executive Functions  Concentration: Poor  Attention  Span: Fair  Recall: [employer] of Knowledge: Fair  Language: Fair   Psychomotor Activity  Psychomotor Activity:No data recorded  Assets  Assets: Communication Skills   Sleep  Sleep:No data recorded   Blood pressure (!) 74/57, pulse 99, temperature (!) 97.1 F (36.2 C), resp. rate 16, height 5\' 6"  (1.676 m), weight 86 kg, SpO2 97%. Body mass index is 30.59 kg/m.   Treatment Plan Summary: Daily contact with patient to assess and evaluate symptoms and progress in treatment, Medication management, and Plan continue current medications.  [name], DO 03/12/2023, 11:40 AM

## 2023-03-13 DIAGNOSIS — F250 Schizoaffective disorder, bipolar type: Secondary | ICD-10-CM | POA: Diagnosis not present

## 2023-03-13 NOTE — Plan of Care (Signed)
°  Problem: Elimination: °Goal: Will not experience complications related to bowel motility °Outcome: Progressing °  °Problem: Pain Managment: °Goal: General experience of comfort will improve °Outcome: Progressing °  °

## 2023-03-13 NOTE — Progress Notes (Signed)
   03/13/23 0800  Psych Admission Type (Psych Patients Only)  Admission Status Voluntary  Psychosocial Assessment  Patient Complaints Depression  Eye Contact Fair  Facial Expression Flat  Affect Depressed  Speech Logical/coherent  Interaction Minimal  Motor Activity Slow  Appearance/Hygiene In scrubs  Behavior Characteristics Appropriate to situation  Mood Pleasant  Thought Process  Coherency WDL  Content WDL  Delusions None reported or observed  Perception WDL  Hallucination Auditory  Judgment Impaired  Confusion None  Danger to Self  Current suicidal ideation? Denies  Agreement Not to Harm Self Yes  Description of Agreement verbal  Danger to Others  Danger to Others None reported or observed  Danger to Others Abnormal  Harmful Behavior to others No threats or harm toward other people  Destructive Behavior No threats or harm toward property

## 2023-03-13 NOTE — Group Note (Signed)
Recreation Therapy Group Note   Group Topic:Emotion Expression  Group Date: 03/13/2023 Start Time: 1400 End Time: 1450 Facilitators: Rosina Lowenstein, LRT, CTRS Location:  Dayroom  Group Description: Positivity Collage. LRT and patients discussed the importance of having a positive mindset and being happy. Patients received magazines, safety scissors, a glue stick and a piece of paper. Pts were encouraged to find images or words in the magazines that showed "happiness" or positivity to them. Pt shared their collage with the group once they were finished. LRT and pts discussed how it can be difficult to always have a positive mindset, especially when they have mental health challenges.   Goal Area(s) Addressed:  Pt will identify things associate with positivity. Pt will reduce negative thinking. Pt will identify a new coping skill of thinking positive thoughts.    Affect/Mood: Appropriate   Participation Level: Active and Engaged   Participation Quality: Independent   Behavior: Appropriate, Calm, and Cooperative   Speech/Thought Process: Coherent   Insight: Good   Judgement: Good   Modes of Intervention: Art, Education, Exploration, and Guided Discussion   Patient Response to Interventions:  Attentive, Engaged, and Receptive   Education Outcome:  Acknowledges education   Clinical Observations/Individualized Feedback: Betty Henderson was active in their participation of session activities and group discussion. Pt identified "being together at home with friends and family. Eating and having a party" as positive things. Pt interacted well with LRT and peers duration of session.    Plan: Continue to engage patient in RT group sessions 2-3x/week.   Rosina Lowenstein, LRT, CTRS 03/13/2023 4:42 PM

## 2023-03-13 NOTE — Plan of Care (Signed)
  Problem: Education: Goal: Knowledge of General Education information will improve Description: Including pain rating scale, medication(s)/side effects and non-pharmacologic comfort measures Outcome: Progressing   Problem: Health Behavior/Discharge Planning: Goal: Ability to manage health-related needs will improve Outcome: Progressing   Problem: Clinical Measurements: Goal: Ability to maintain clinical measurements within normal limits will improve Outcome: Progressing Goal: Diagnostic test results will improve Outcome: Progressing   Problem: Nutrition: Goal: Adequate nutrition will be maintained Outcome: Progressing   Problem: Coping: Goal: Level of anxiety will decrease Outcome: Progressing   Problem: Elimination: Goal: Will not experience complications related to bowel motility Outcome: Progressing Goal: Will not experience complications related to urinary retention Outcome: Progressing   Problem: Pain Managment: Goal: General experience of comfort will improve Outcome: Progressing   Problem: Safety: Goal: Ability to remain free from injury will improve Outcome: Progressing   Problem: Skin Integrity: Goal: Risk for impaired skin integrity will decrease Outcome: Progressing

## 2023-03-13 NOTE — Progress Notes (Signed)
[facility] MD Progress Note  03/13/2023 3:02 PM Betty Henderson  MRN:  [id number] Subjective: Betty Henderson is seen on rounds.  No changes.  She has been in good controls.  Compliant with medications.  Social work is continuing to work with Adult Management consultant on placement. Principal Problem: Schizoaffective disorder, bipolar type (HCC) Diagnosis: Principal Problem:   Schizoaffective disorder, bipolar type (HCC)  Total Time spent with patient: 15 minutes  Past Psychiatric History: Schizoaffective disorder  Past Medical History:  Past Medical History:  Diagnosis Date   Anemia    Arthritis    Chronic pain    Drug-seeking behavior    Malingering    Osteopetrosis    Psychosis (HCC)    Schizoaffective disorder, bipolar type (HCC)     Past Surgical History:  Procedure Laterality Date   MOUTH SURGERY     TUBAL LIGATION     Family History:  Family History  Family history unknown: Yes   Family Psychiatric  History: Unremarkable Social History:  Social History   Substance and Sexual Activity  Alcohol Use Not Currently   Comment: 1 cocktail 3 weeks ago     Social History   Substance and Sexual Activity  Drug Use Not Currently   Types: Cocaine   Comment: states "it's legal though"    Social History   Socioeconomic History   Marital status: Single    Spouse name: Not on file   Number of children: Not on file   Years of education: Not on file   Highest education level: Not on file  Occupational History   Not on file  Tobacco Use   Smoking status: Every Day    Current packs/day: 0.50    Types: Cigarettes   Smokeless tobacco: Never  Vaping Use   Vaping status: Never Used  Substance and Sexual Activity   Alcohol use: Not Currently    Comment: 1 cocktail 3 weeks ago   Drug use: Not Currently    Types: Cocaine    Comment: states "it's legal though"   Sexual activity: Never  Other Topics Concern   Not on file  Social History Narrative   Not on file   Social Drivers  of Health   Financial Resource Strain: Not on file  Food Insecurity: Food Insecurity Present (08/22/2022)   Hunger Vital Sign    Worried About Running Out of Food in the Last Year: Sometimes true    Ran Out of Food in the Last Year: Sometimes true  Transportation Needs: Patient Unable To Answer (08/22/2022)   PRAPARE - Transportation    Lack of Transportation (Medical): Patient unable to answer    Lack of Transportation (Non-Medical): Patient unable to answer  Recent Concern: Transportation Needs - Unmet Transportation Needs (06/13/2022)   Received from [facility], [facility]   [facility] - Transportation    Lack of Transportation (Medical): Not on file    Lack of Transportation (Non-Medical): Yes  Physical Activity: Not on file  Stress: Not on file  Social Connections: Unknown (08/06/2021)   Received from [facility], [facility]   Social Network    Social Network: Not on file   Additional Social History:                         Sleep: Good  Appetite:  Good  Current Medications: Current Facility-Administered Medications  Medication Dose Route Frequency Provider Last Rate Last Admin   acetaminophen (TYLENOL) tablet 650 mg  650 mg  Oral Q6H PRN [name], DO   650 mg at 03/11/23 4098   alum & mag hydroxide-simeth (MAALOX/MYLANTA) 200-200-20 MG/5ML suspension 30 mL  30 mL Oral Q4H PRN [name], DO   30 mL at 02/28/23 0153   aspirin EC tablet 81 mg  81 mg Oral Daily [name], MD   81 mg at 03/13/23 0955   atorvastatin (LIPITOR) tablet 20 mg  20 mg Oral Daily [name], MD   20 mg at 03/13/23 0955   diphenhydrAMINE (BENADRYL) capsule 50 mg  50 mg Oral Q6H PRN [name], DO   50 mg at 09/29/22 2254   Or   diphenhydrAMINE (BENADRYL) injection 50 mg  50 mg Intramuscular Q6H PRN [name], DO       glimepiride (AMARYL) tablet 8 mg  8 mg Oral Q breakfast [name], MD   8 mg at 03/13/23 0758    haloperidol (HALDOL) tablet 5 mg  5 mg Oral Q6H PRN [name], DO   5 mg at 09/28/22 2149   Or   haloperidol lactate (HALDOL) injection 5 mg  5 mg Intramuscular Q6H PRN [name], DO       ibuprofen (ADVIL) tablet 600 mg  600 mg Oral Q6H PRN [name], [name], MD   600 mg at 03/05/23 1191   linagliptin (TRADJENTA) tablet 5 mg  5 mg Oral Daily [name], MD   5 mg at 03/13/23 4782   LORazepam (ATIVAN) tablet 1 mg  1 mg Oral TID PRN [name], DO   1 mg at 02/24/23 2139   magnesium hydroxide (MILK OF MAGNESIA) suspension 30 mL  30 mL Oral Daily PRN [name], DO   30 mL at 03/04/23 1149   metFORMIN (GLUCOPHAGE) tablet 1,000 mg  1,000 mg Oral BH-q8a4p [name], DO   1,000 mg at 03/13/23 0758   ondansetron (ZOFRAN) tablet 4 mg  4 mg Oral Q4H PRN [name], MD   4 mg at 02/27/23 2016   oxybutynin (DITROPAN) tablet 5 mg  5 mg Oral BID [name], MD   5 mg at 03/13/23 0955   paliperidone (INVEGA SUSTENNA) injection 156 mg  156 mg Intramuscular Q28 days [name], MD   156 mg at 03/12/23 1516   pantoprazole (PROTONIX) EC tablet 80 mg  80 mg Oral Daily [name], DO   80 mg at 03/13/23 0955   topiramate (TOPAMAX) tablet 50 mg  50 mg Oral BH-q8a4p [name], DO   50 mg at 03/13/23 0758   traZODone (DESYREL) tablet 50 mg  50 mg Oral QHS PRN [name], DO   50 mg at 03/12/23 2128   zonisamide (ZONEGRAN) capsule 100 mg  100 mg Oral QHS [name], DO   100 mg at 03/12/23 2128    Lab Results: No results found for this or any previous visit (from the past 48 hours).  Blood Alcohol level:  Lab Results  Component Value Date   ETH <10 08/17/2022   ETH <10 01/11/2021    Metabolic Disorder Labs: Lab Results  Component Value Date   HGBA1C 11.0 (H) 01/27/2023   MPG 269 01/27/2023   No results found for: "PROLACTIN" Lab Results  Component Value Date    CHOL 259 (H) 01/27/2023   TRIG 155 (H) 01/27/2023   HDL 65 01/27/2023   CHOLHDL 4.0 01/27/2023   VLDL 31 01/27/2023  LDLCALC 163 (H) 01/27/2023    Physical Findings: AIMS:  , ,  ,  ,    CIWA:    COWS:     Musculoskeletal: Strength & Muscle Tone: within normal limits Gait & Station: normal Patient leans: N/A  Psychiatric Specialty Exam:  Presentation  General Appearance:  Casual; Neat  Eye Contact: Fair  Speech: Slow; Slurred  Speech Volume: Decreased  Handedness: Right   Mood and Affect  Mood: Anxious  Affect: Inappropriate   Thought Process  Thought Processes: Disorganized  Descriptions of Associations:Loose  Orientation:Partial  Thought Content:Illogical; Rumination  History of Schizophrenia/Schizoaffective disorder:No data recorded Duration of Psychotic Symptoms:No data recorded Hallucinations:No data recorded Ideas of Reference:Paranoia  Suicidal Thoughts:No data recorded Homicidal Thoughts:No data recorded  Sensorium  Memory: Immediate Fair; Remote Poor  Judgment: Poor  Insight: Poor   Executive Functions  Concentration: Poor  Attention Span: Fair  Recall: [employer] of Knowledge: Fair  Language: Fair   Psychomotor Activity  Psychomotor Activity:No data recorded  Assets  Assets: Communication Skills   Sleep  Sleep:No data recorded    Blood pressure 116/64, pulse 75, temperature (!) 97.3 F (36.3 C), resp. rate 18, height 5\' 6"  (1.676 m), weight 86 kg, SpO2 98%. Body mass index is 30.59 kg/m.   Treatment Plan Summary: Daily contact with patient to assess and evaluate symptoms and progress in treatment, Medication management, and Plan continue current medications.  [name], DO 03/13/2023, 3:02 PM

## 2023-03-13 NOTE — Group Note (Signed)
Date:  03/13/2023 Time:  1:36 PM  Group Topic/Focus:  Emotional Education:   The focus of this group is to discuss what feelings/emotions are, and how they are experienced. Healthy Communication:   The focus of this group is to discuss communication, barriers to communication, as well as healthy ways to communicate with others.    Participation Level:  Active  Participation Quality:  Appropriate, Attentive, Sharing, and Supportive  Affect:  Appropriate  Cognitive:  Alert, Appropriate, and Oriented  Insight: Appropriate, Good, and Improving  Engagement in Group:  Engaged  Modes of Intervention:  Activity, Problem-solving, Socialization, and Support  Additional Comments:     Alexis Frock 03/13/2023, 1:36 PM

## 2023-03-13 NOTE — Group Note (Signed)
[facility] LCSW Group Therapy Note   Group Date: 03/13/2023 Start Time: 1300 End Time: 1330   Type of Therapy/Topic:  Group Therapy:  Balance in Life  Participation Level:  Did Not Attend   Description of Group:    This group will address the concept of balance and how it feels and looks when one is unbalanced. Patients will be encouraged to process areas in their lives that are out of balance, and identify reasons for remaining unbalanced. Facilitators will guide patients utilizing problem- solving interventions to address and correct the stressor making their life unbalanced. Understanding and applying boundaries will be explored and addressed for obtaining  and maintaining a balanced life. Patients will be encouraged to explore ways to assertively make their unbalanced needs known to significant others in their lives, using other group members and facilitator for support and feedback.  Therapeutic Goals: Patient will identify two or more emotions or situations they have that consume much of in their lives. Patient will identify signs/triggers that life has become out of balance:  Patient will identify two ways to set boundaries in order to achieve balance in their lives:  Patient will demonstrate ability to communicate their needs through discussion and/or role plays  Summary of Patient Progress:    X    Therapeutic Modalities:   Cognitive Behavioral Therapy Solution-Focused Therapy Assertiveness Training   Elza Rafter, LCSWA

## 2023-03-13 NOTE — Group Note (Signed)
Date:  03/13/2023 Time:  8:36 PM  Group Topic/Focus:  Goals Group:   The focus of this group is to help patients establish daily goals to achieve during treatment and discuss how the patient can incorporate goal setting into their daily lives to aide in recovery.    Participation Level:  Active  Participation Quality:  Appropriate  Affect:  Appropriate  Cognitive:  Appropriate  Insight: Appropriate  Engagement in Group:  Engaged  Modes of Intervention:  Discussion   Lenore Cordia 03/13/2023, 8:36 PM

## 2023-03-13 NOTE — Progress Notes (Signed)
Patient observed lying in bed, resting at shift change. Patient ambulated to day room to receive evening snack and participated in PM groups. Patient medication compliant and received no PRN medications. Patient denies SI/HI/AVH/pain and reports decreased anxiety. Patient remains isolative and guarded, VSS, BP slightly low but patient asymptomatic, no acute distress noted. Routine observations to continue Q 15 mins to monitor patient safety.

## 2023-03-14 DIAGNOSIS — F250 Schizoaffective disorder, bipolar type: Secondary | ICD-10-CM | POA: Diagnosis not present

## 2023-03-14 NOTE — BH IP Treatment Plan (Signed)
Interdisciplinary Treatment and Diagnostic Plan Update  03/14/2023 Time of Session: 9:30 AM  Betty Henderson MRN: [id number]  Principal Diagnosis: Schizoaffective disorder, bipolar type (HCC)  Secondary Diagnoses: Principal Problem:   Schizoaffective disorder, bipolar type (HCC)   Current Medications:  Current Facility-Administered Medications  Medication Dose Route Frequency Provider Last Rate Last Admin   acetaminophen (TYLENOL) tablet 650 mg  650 mg Oral Q6H PRN [name], DO   650 mg at 03/11/23 6578   alum & mag hydroxide-simeth (MAALOX/MYLANTA) 200-200-20 MG/5ML suspension 30 mL  30 mL Oral Q4H PRN [name], DO   30 mL at 02/28/23 0153   aspirin EC tablet 81 mg  81 mg Oral Daily [name], MD   81 mg at 03/13/23 0955   atorvastatin (LIPITOR) tablet 20 mg  20 mg Oral Daily [name], MD   20 mg at 03/13/23 0955   diphenhydrAMINE (BENADRYL) capsule 50 mg  50 mg Oral Q6H PRN [name], DO   50 mg at 09/29/22 2254   Or   diphenhydrAMINE (BENADRYL) injection 50 mg  50 mg Intramuscular Q6H PRN [name], DO       glimepiride (AMARYL) tablet 8 mg  8 mg Oral Q breakfast [name], MD   8 mg at 03/13/23 0758   haloperidol (HALDOL) tablet 5 mg  5 mg Oral Q6H PRN [name], DO   5 mg at 09/28/22 2149   Or   haloperidol lactate (HALDOL) injection 5 mg  5 mg Intramuscular Q6H PRN [name], DO       ibuprofen (ADVIL) tablet 600 mg  600 mg Oral Q6H PRN [name], [name], MD   600 mg at 03/05/23 0829   linagliptin (TRADJENTA) tablet 5 mg  5 mg Oral Daily [name], MD   5 mg at 03/13/23 4696   LORazepam (ATIVAN) tablet 1 mg  1 mg Oral TID PRN [name], DO   1 mg at 02/24/23 2139   magnesium hydroxide (MILK OF MAGNESIA) suspension 30 mL  30 mL Oral Daily PRN [name], DO   30 mL at 03/04/23 1149   metFORMIN (GLUCOPHAGE) tablet 1,000 mg  1,000 mg Oral BH-[id number]  [name], DO   1,000 mg at 03/13/23 1632   ondansetron (ZOFRAN) tablet 4 mg  4 mg Oral Q4H PRN [name], MD   4 mg at 02/27/23 2016   oxybutynin (DITROPAN) tablet 5 mg  5 mg Oral BID [name], MD   5 mg at 03/13/23 2120   paliperidone (INVEGA SUSTENNA) injection 156 mg  156 mg Intramuscular Q28 days [name], MD   156 mg at 03/12/23 1516   pantoprazole (PROTONIX) EC tablet 80 mg  80 mg Oral Daily [name], DO   80 mg at 03/13/23 0955   topiramate (TOPAMAX) tablet 50 mg  50 mg Oral BH-q8a4p [name], DO   50 mg at 03/13/23 1632   traZODone (DESYREL) tablet 50 mg  50 mg Oral QHS PRN [name], DO   50 mg at 03/13/23 2121   zonisamide (ZONEGRAN) capsule 100 mg  100 mg Oral QHS [name], DO   100 mg at 03/13/23 2121   PTA Medications: Medications Prior to Admission  Medication Sig Dispense Refill Last Dose/Taking   ondansetron (ZOFRAN) 4 MG tablet Take 1 tablet (4 mg total) by mouth every 8 (eight) hours as needed for nausea  or vomiting. (Patient not taking: Reported on 08/17/2022) 20 tablet 0     Patient Stressors: Financial difficulties   Health problems   Medication change or noncompliance    Patient Strengths: Ability for insight   Treatment Modalities: Medication Management, Group therapy, Case management,  1 to 1 session with clinician, Psychoeducation, Recreational therapy.   Physician Treatment Plan for Primary Diagnosis: Schizoaffective disorder, bipolar type (HCC) Long Term Goal(s): Improvement in symptoms so as ready for discharge   Short Term Goals: Ability to identify changes in lifestyle to reduce recurrence of condition will improve Ability to verbalize feelings will improve Ability to disclose and discuss suicidal ideas Ability to demonstrate self-control will improve Ability to identify and develop effective coping behaviors will improve Ability to maintain clinical  measurements within normal limits will improve Compliance with prescribed medications will improve Ability to identify triggers associated with substance abuse/mental health issues will improve  Medication Management: Evaluate patient's response, side effects, and tolerance of medication regimen.  Therapeutic Interventions: 1 to 1 sessions, Unit Group sessions and Medication administration.  Evaluation of Outcomes: Progressing  Physician Treatment Plan for Secondary Diagnosis: Principal Problem:   Schizoaffective disorder, bipolar type (HCC)  Long Term Goal(s): Improvement in symptoms so as ready for discharge   Short Term Goals: Ability to identify changes in lifestyle to reduce recurrence of condition will improve Ability to verbalize feelings will improve Ability to disclose and discuss suicidal ideas Ability to demonstrate self-control will improve Ability to identify and develop effective coping behaviors will improve Ability to maintain clinical measurements within normal limits will improve Compliance with prescribed medications will improve Ability to identify triggers associated with substance abuse/mental health issues will improve     Medication Management: Evaluate patient's response, side effects, and tolerance of medication regimen.  Therapeutic Interventions: 1 to 1 sessions, Unit Group sessions and Medication administration.  Evaluation of Outcomes: Progressing   RN Treatment Plan for Primary Diagnosis: Schizoaffective disorder, bipolar type (HCC) Long Term Goal(s): Knowledge of disease and therapeutic regimen to maintain health will improve  Short Term Goals: Ability to remain free from injury will improve, Ability to verbalize frustration and anger appropriately will improve, Ability to demonstrate self-control, Ability to participate in decision making will improve, Ability to verbalize feelings will improve, Ability to disclose and discuss suicidal ideas, Ability  to identify and develop effective coping behaviors will improve, and Compliance with prescribed medications will improve  Medication Management: RN will administer medications as ordered by provider, will assess and evaluate patient's response and provide education to patient for prescribed medication. RN will report any adverse and/or side effects to prescribing provider.  Therapeutic Interventions: 1 on 1 counseling sessions, Psychoeducation, Medication administration, Evaluate responses to treatment, Monitor vital signs and CBGs as ordered, Perform/monitor CIWA, COWS, AIMS and Fall Risk screenings as ordered, Perform wound care treatments as ordered.  Evaluation of Outcomes: Progressing   LCSW Treatment Plan for Primary Diagnosis: Schizoaffective disorder, bipolar type (HCC) Long Term Goal(s): Safe transition to appropriate next level of care at discharge, Engage patient in therapeutic group addressing interpersonal concerns.  Short Term Goals: Engage patient in aftercare planning with referrals and resources, Increase social support, Increase ability to appropriately verbalize feelings, Increase emotional regulation, Facilitate acceptance of mental health diagnosis and concerns, Facilitate patient progression through stages of change regarding substance use diagnoses and concerns, Identify triggers associated with mental health/substance abuse issues, and Increase skills for wellness and recovery  Therapeutic Interventions: Assess for all discharge needs, 1 to  1 time with Child psychotherapist, Explore available resources and support systems, Assess for adequacy in community support network, Educate family and significant other(s) on suicide prevention, Complete Psychosocial Assessment, Interpersonal group therapy.  Evaluation of Outcomes: Progressing   Progress in Treatment: Attending groups: Yes. and No. Participating in groups: Yes. and No. Taking medication as prescribed: Yes. Toleration  medication: Yes. Family/Significant other contact made: Yes, individual(s) contacted:  Takota Rackow, son, [phone] Patient understands diagnosis: Yes. and No. Discussing patient identified problems/goals with staff: Yes. Medical problems stabilized or resolved: Yes. Denies suicidal/homicidal ideation: Yes. Issues/concerns per patient self-inventory: No. Other: none   New problem(s) identified: No, Describe:  none Updates 12/12/22: No changes made at this time Updates 12/17/22: No changes made at this time Update 12/24/22 No changes at this time. Update 12/29/2022: No changes at this time. Update 01/03/23: No changes at this time Update 01/08/23: No changes at this time Update 01/13/23: No changes at this time Update 01/18/23: None at this time. Update 01/23/2023:  No changes at this time. Update 01/28/23: Pt recently diagnosed with Diabetes Type II  Update 02/02/23: None at this time. Update 02/07/23: No changes at this time Update 02/12/23: No changes at this time Update 02/17/23: No changes at this time. Update: 02/22/2023: No changes at this time. Update 02/27/23: No changes at this time Update 03/04/23: No changes at this time  Update 03/09/23: No changes at this time Update 03/14/23: No changes at this time      New Short Term/Long Term Goal(s): Update 6/30: none at this time. Update 09/27/2022:  No changes at this time.  Update 10/02/2022:  No changes at this time. Update 10/07/22: No changes at this time 10/12/22: No changes at this time Update 10/18/22: No changes at this time Update 10/23/22: No changes at this time Update 10/28/22: No changes at this time Update 11/07/22: No changes at this time Update 11/12/22: No changes at this time  Update 11/17/22: None at this time. Update 11/22/22: None at this time. Update 11/27/22 No changes at this time. Update 12/02/22 No changes at this time  Update 12/07/22: None at this time. Updates 12/12/22: No changes made at this time Updates 12/17/22: No changes made at this  time Update 12/24/22 No changes at this time. Update 12/29/2022: No changes at this time. Update 01/03/23: No changes at this time Update 01/08/23: No changes at this time Update 01/13/23: No changes at this time   Update 01/18/23: No changes at this time. Update 01/23/2023:  No changes at this time. Update 01/28/2023:  No changes at this time. Update 02/02/23: None at this time.  Update 02/07/23: No changes at this time Update 02/12/23: No changes at this time Update 02/17/23: No changes at this time. Update: 02/22/2023: No changes at this time. Update 02/27/23: No changes at this time Update 03/04/23: No changes at this time Update 03/09/23: No changes at this time Update 03/14/23: No changes at this time             Patient Goals:  Update 6/30: none at this time. Update 09/27/2022:  No changes at this time. Update 10/02/2022:  No changes at this time. Update 10/07/22: No changes at this time 10/12/22: No changes at this time Update 10/18/22: No changes at this time Update 10/23/22: No changes at this time Update 10/28/22: No changes at this time Update 11/07/22: No changes at this time Update 11/12/22: No changes at this time Update 11/17/22: None at this time. Update 11/22/22: None  at this time. Update 11/27/22 No changes at this time Update 12/02/22 No changes at this time   Update 12/07/22: None at this time. Updates 12/12/22: No changes made at this time Updates 12/17/22: No changes made at this time Update 12/24/22 No changes at this time. Update 12/29/2022: No changes at this time. Update 01/03/23: No changes at this time Update 01/08/23: No changes at this time Update 01/13/23: No changes at this time   Update 01/18/23: No changes at this time. Update 01/23/2023:  No changes at this time.  Update 01/28/2023:  No changes at this time. Update 02/02/23: None at this time.  Update 02/07/23: No changes at this time Update 02/12/23: No changes at this time Update 02/17/23: No changes at this time. Update: 02/22/2023: No changes  at this time. Update 02/27/23: No changes at this time Update 03/04/23: No changes at this time  Update 03/09/23: No changes at this time Update 03/14/23: No changes at this time             Discharge Plan or Barriers: Update 6/30: APS report has been made and patient is being investigated for guardianship needs.  Remains homeless with limited supports. Update 09/27/2022:  No changes at this time.  Update 10/02/2022:  Patient remains safe on the unit at this time.  Patient remains psychotic at this time.  APS report has been made, however, no follow up from the caseworker on her case.  No safe discharge identified.  CSW has requested that application for Medicaid and disability be completed.   Update 10/07/22: No changes at this time 10/12/22: No changes at this time Update 10/18/22: No changes at this time Update 10/23/22: No changes at this time Update 10/23/22: No changes at this time Update 10/28/22: According to [facility], pt's caseworker at DSS, the petition was filed for guardianship on 10/21/22, now awaiting for the petition to be approvedUpdate 11/07/22: No changes at this time Update 11/12/22: CSW sent over patients information to [facility] and [facility], awaiting a response. CSW awaiting word from DSS regarding pt's petition and what agency will become her guardian. Update 11/17/22: No changes at this time. Update 11/22/22: CSW has sent patients information to multiple facilities that were suggested by leadership, pt has been denied from the facility or the facility does not respond. CSW continues to send pt's information to nursing homes. Update 11/27/22 CSW contacted DSS to inquire about the status of guardianship. No response at this time Update 12/02/22 Supervisor Loraine Leriche contacted DSS who stated they are not taking pt on for guardianship but that Quincy Carnes will be present tomorrow to do a visit with social worker and pt and that pt's son will be signing documentation for placement for the pt.  Update 12/07/22: [facility] DSS has declined to take guardianship. Son, Casimiro Needle is to step into a more present role in deciding placement. A conversation is still needed. Updates 12/12/22: CSW staffed case with leadership and they state that a family meeting must happen with pt's son. CSW contacted pt's financial navigator and her DSS worker so that they may be present on the call and are able to speak to the efforts they have made to secure pt funding and housing. CSW waiting on responses from both, leadership is aware. Updates 12/17/22: CSW awaiting updates from both [facility] DSS and [facility] financial navigators as to status of pt's applications for Medicaid/[employer] and Disability, respectively. Update 12/24/22 CSW sent email regarding family meeting per request of  supervisors. CSW awaiting responses from all parties to schedule family meeting. Update: 12/29/2022 No changes at this time. Update 01/03/23: CSW continues to await responses on the scheduling of the family meeting. CSW received call from Primitivo Gauze at [employer], CSW sent FL2 to [place] to aide in pt's discharge planning. Update 01/08/23: CSW has reached out again to leadership and financial counseling. CSW continues to await a response. CSW called Sharlee Blew, awaiting a response for status of pt's placement options. Update 01/13/23: Pt is to meet with psychologist tomorrow 10/22 to complete capacity testing so that DSS can retry for guardianship  Update 01/18/23: The patient is still awaiting placement.  Update 01/23/2023:  Patient remains safe on the unit at this time.  CSW team continues to work on placement.  CSW team in contact with DSS to develop plan on guardianship.   Work continues to Hospital doctor. Update 01/28/2023:  Medicaid application remains pending. DSS awaiting records from medical records to retry fo guardianship.  Update 02/02/23: None at this time.  Update 02/07/23: Sharlee Blew at DSS confirms that pt  records were received and that they are petitioning for guardianship, she informs CSW that decision should be made on court date by Monday or Tuesday of next week. CSW still awaits status update on Medicaid application Update 02/12/23: Medicaid Application still pending. TOC Supervisors recommend that CSW continue searching for placement as we await DSS taking guardianship. Update 02/17/23: DSS confirms guardianship date for January 14th, 2025. CSW continue to search for placement. Update: 02/22/2023: No changes at this time.  Update 02/27/23: CSW sent pt's FL2 to multiple different placements. Awaiting to hear back from placements. Update 03/04/23: No changes at this time  Update 03/09/23: No changes at this time Update 03/14/23: CSW still awaits to hear from placements for pt. Pt remains stable on the unit. According to DSS pt has been legally considered disabled but needs to complete disability application. CSW has reached out to financial navigators to complete this.          Reason for Continuation of Hospitalization: Hallucinations Mania Medication stabilization   Estimated Length of Stay:  Update 6/30: 1-7 days Update 09/27/2022:  TBD Update 10/02/2022:  No changes at this time. UpdaUpdate 10/18/22: No changes at this time te 10/07/22: No changes at this time Update 10/18/22: No changes at this time Update 10/23/22: No changes at this time Update 10/28/22: No changes at this time Update 11/07/22: No changes at this time Update 11/12/22: No changes at this time  Update 11/17/22: No changes at this time. Update 11/22/22: None at this time. Update 11/27/22 No changes at this time. Update 12/02/22 No changes at this time  Update 12/07/22: None at this time. Updates 12/12/22: No changes made at this time Updates 12/17/22: TBD. Update 12/29/2022: No changes at this time. Update 01/03/23: TBDUpdate 01/08/23: TBD Update 01/13/23: TBD  Update 01/18/23: No changes at this time.  Update 01/23/2023:  TBD Update 01/28/2023:  TBD  Update 02/02/23: None at this time. Update 02/07/23: TBD Update 02/12/23: TBD Update 02/17/23: TBD. Update: 02/22/2023: TBD. Update: 02/27/2023: TBD. Update: 03/04/2023: TBD. Update 03/09/23: TBD Update 03/14/23: TBD    Last 3 [place] Suicide Severity Risk Score: Flowsheet Row Admission (Current) from 08/22/2022 in [facility] ED from 08/17/2022 in [facility] at [facility] ED from 08/12/2022 in [facility] Emergency Department at [facility]  C-SSRS RISK CATEGORY Low Risk No Risk No Risk  Last PHQ 2/9 Scores:     No data to display          Scribe for Treatment Team: Elza Rafter, Theresia Majors 03/14/2023 9:12 AM

## 2023-03-14 NOTE — Plan of Care (Signed)
  Problem: Safety: Goal: Ability to remain free from injury will improve Outcome: Progressing   Problem: Skin Integrity: Goal: Risk for impaired skin integrity will decrease Outcome: Progressing   

## 2023-03-14 NOTE — Progress Notes (Signed)
   03/14/23 0620  15 Minute Checks  Location Bedroom  Visual Appearance Calm  Behavior Sleeping  Sleep (Behavioral Health Patients Only)  Calculate sleep? (Click Yes once per 24 hr at 0600 safety check) Yes  Documented sleep last 24 hours 10.25

## 2023-03-14 NOTE — Progress Notes (Signed)
[facility] MD Progress Note  03/14/2023 11:57 AM [name]  MRN:  [id number] Subjective: Betty Henderson is seen on rounds.  Social work has no news.  She has been compliant and in good controls on the unit.  No problems with her medications.  Nurses report no issues. Principal Problem: Schizoaffective disorder, bipolar type (HCC) Diagnosis: Principal Problem:   Schizoaffective disorder, bipolar type (HCC)  Total Time spent with patient: 15 minutes  Past Psychiatric History: Schizoaffective disorder  Past Medical History:  Past Medical History:  Diagnosis Date   Anemia    Arthritis    Chronic pain    Drug-seeking behavior    Malingering    Osteopetrosis    Psychosis (HCC)    Schizoaffective disorder, bipolar type (HCC)     Past Surgical History:  Procedure Laterality Date   MOUTH SURGERY     TUBAL LIGATION     Family History:  Family History  Family history unknown: Yes   Family Psychiatric  History: Unremarkable Social History:  Social History   Substance and Sexual Activity  Alcohol Use Not Currently   Comment: 1 cocktail 3 weeks ago     Social History   Substance and Sexual Activity  Drug Use Not Currently   Types: Cocaine   Comment: states "it's legal though"    Social History   Socioeconomic History   Marital status: Single    Spouse name: Not on file   Number of children: Not on file   Years of education: Not on file   Highest education level: Not on file  Occupational History   Not on file  Tobacco Use   Smoking status: Every Day    Current packs/day: 0.50    Types: Cigarettes   Smokeless tobacco: Never  Vaping Use   Vaping status: Never Used  Substance and Sexual Activity   Alcohol use: Not Currently    Comment: 1 cocktail 3 weeks ago   Drug use: Not Currently    Types: Cocaine    Comment: states "it's legal though"   Sexual activity: Never  Other Topics Concern   Not on file  Social History Narrative   Not on file   Social Drivers of  Health   Financial Resource Strain: Not on file  Food Insecurity: Food Insecurity Present (08/22/2022)   Hunger Vital Sign    Worried About Running Out of Food in the Last Year: Sometimes true    Ran Out of Food in the Last Year: Sometimes true  Transportation Needs: Patient Unable To Answer (08/22/2022)   PRAPARE - Transportation    Lack of Transportation (Medical): Patient unable to answer    Lack of Transportation (Non-Medical): Patient unable to answer  Recent Concern: Transportation Needs - Unmet Transportation Needs (06/13/2022)   Received from [facility], [facility]   [facility] - Transportation    Lack of Transportation (Medical): Not on file    Lack of Transportation (Non-Medical): Yes  Physical Activity: Not on file  Stress: Not on file  Social Connections: Unknown (08/06/2021)   Received from [facility], [facility]   Social Network    Social Network: Not on file   Additional Social History:                         Sleep: Good  Appetite:  Good  Current Medications: Current Facility-Administered Medications  Medication Dose Route Frequency Provider Last Rate Last Admin   acetaminophen (TYLENOL) tablet 650 mg  650 mg Oral Q6H PRN [name], DO   650 mg at 03/11/23 2841   alum & mag hydroxide-simeth (MAALOX/MYLANTA) 200-200-20 MG/5ML suspension 30 mL  30 mL Oral Q4H PRN [name], DO   30 mL at 02/28/23 0153   aspirin EC tablet 81 mg  81 mg Oral Daily [name], MD   81 mg at 03/14/23 0920   atorvastatin (LIPITOR) tablet 20 mg  20 mg Oral Daily [name], MD   20 mg at 03/14/23 0920   diphenhydrAMINE (BENADRYL) capsule 50 mg  50 mg Oral Q6H PRN [name], DO   50 mg at 09/29/22 2254   Or   diphenhydrAMINE (BENADRYL) injection 50 mg  50 mg Intramuscular Q6H PRN [name], DO       glimepiride (AMARYL) tablet 8 mg  8 mg Oral Q breakfast [name], MD   8 mg at 03/14/23 0920   haloperidol  (HALDOL) tablet 5 mg  5 mg Oral Q6H PRN [name], DO   5 mg at 09/28/22 2149   Or   haloperidol lactate (HALDOL) injection 5 mg  5 mg Intramuscular Q6H PRN [name], DO       ibuprofen (ADVIL) tablet 600 mg  600 mg Oral Q6H PRN [name], [name], MD   600 mg at 03/05/23 3244   linagliptin (TRADJENTA) tablet 5 mg  5 mg Oral Daily [name], MD   5 mg at 03/14/23 0920   LORazepam (ATIVAN) tablet 1 mg  1 mg Oral TID PRN [name], DO   1 mg at 02/24/23 2139   magnesium hydroxide (MILK OF MAGNESIA) suspension 30 mL  30 mL Oral Daily PRN [name], DO   30 mL at 03/04/23 1149   metFORMIN (GLUCOPHAGE) tablet 1,000 mg  1,000 mg Oral BH-q8a4p [name], DO   1,000 mg at 03/14/23 0920   ondansetron (ZOFRAN) tablet 4 mg  4 mg Oral Q4H PRN [name], MD   4 mg at 02/27/23 2016   oxybutynin (DITROPAN) tablet 5 mg  5 mg Oral BID [name], MD   5 mg at 03/14/23 0920   paliperidone (INVEGA SUSTENNA) injection 156 mg  156 mg Intramuscular Q28 days [name], MD   156 mg at 03/12/23 1516   pantoprazole (PROTONIX) EC tablet 80 mg  80 mg Oral Daily [name], DO   80 mg at 03/14/23 0920   topiramate (TOPAMAX) tablet 50 mg  50 mg Oral BH-q8a4p [name], DO   50 mg at 03/13/23 1632   traZODone (DESYREL) tablet 50 mg  50 mg Oral QHS PRN [name], DO   50 mg at 03/13/23 2121   zonisamide (ZONEGRAN) capsule 100 mg  100 mg Oral QHS [name], DO   100 mg at 03/13/23 2121    Lab Results: No results found for this or any previous visit (from the past 48 hours).  Blood Alcohol level:  Lab Results  Component Value Date   ETH <10 08/17/2022   ETH <10 01/11/2021    Metabolic Disorder Labs: Lab Results  Component Value Date   HGBA1C 11.0 (H) 01/27/2023   MPG 269 01/27/2023   No results found for: "PROLACTIN" Lab Results  Component Value Date   CHOL 259 (H)  01/27/2023   TRIG 155 (H) 01/27/2023   HDL 65 01/27/2023   CHOLHDL 4.0 01/27/2023   VLDL 31  01/27/2023   LDLCALC 163 (H) 01/27/2023    Physical Findings: AIMS:  , ,  ,  ,    CIWA:    COWS:     Musculoskeletal: Strength & Muscle Tone: within normal limits Gait & Station: normal Patient leans: N/A  Psychiatric Specialty Exam:  Presentation  General Appearance:  Casual; Neat  Eye Contact: Fair  Speech: Slow; Slurred  Speech Volume: Decreased  Handedness: Right   Mood and Affect  Mood: Anxious  Affect: Inappropriate   Thought Process  Thought Processes: Disorganized  Descriptions of Associations:Loose  Orientation:Partial  Thought Content:Illogical; Rumination  History of Schizophrenia/Schizoaffective disorder:No data recorded Duration of Psychotic Symptoms:No data recorded Hallucinations:No data recorded Ideas of Reference:Paranoia  Suicidal Thoughts:No data recorded Homicidal Thoughts:No data recorded  Sensorium  Memory: Immediate Fair; Remote Poor  Judgment: Poor  Insight: Poor   Executive Functions  Concentration: Poor  Attention Span: Fair  Recall: [employer] of Knowledge: Fair  Language: Fair   Psychomotor Activity  Psychomotor Activity:No data recorded  Assets  Assets: Communication Skills   Sleep  Sleep:No data recorded    Blood pressure 115/65, pulse 87, temperature 97.6 F (36.4 C), resp. rate 20, height 5\' 6"  (1.676 m), weight 86 kg, SpO2 100%. Body mass index is 30.59 kg/m.   Treatment Plan Summary: Daily contact with patient to assess and evaluate symptoms and progress in treatment, Medication management, and Plan continue current medications.  [name], DO 03/14/2023, 11:57 AM

## 2023-03-14 NOTE — Progress Notes (Signed)
   03/14/23 0000  Psych Admission Type (Psych Patients Only)  Admission Status Voluntary  Psychosocial Assessment  Patient Complaints Depression  Eye Contact Fair  Facial Expression Flat;Sad  Affect Depressed  Speech Logical/coherent  Interaction Minimal;Isolative  Motor Activity Slow  Appearance/Hygiene In scrubs  Behavior Characteristics Appropriate to situation  Mood Pleasant  Thought Process  Coherency WDL  Content WDL  Delusions None reported or observed  Perception WDL  Hallucination Auditory  Judgment Impaired  Confusion None  Danger to Self  Current suicidal ideation? Denies  Agreement Not to Harm Self Yes  Description of Agreement Verbal  Danger to Others  Danger to Others None reported or observed  Danger to Others Abnormal  Harmful Behavior to others No threats or harm toward other people  Destructive Behavior No threats or harm toward property

## 2023-03-14 NOTE — Plan of Care (Signed)
  Problem: Education: Goal: Knowledge of General Education information will improve Description: Including pain rating scale, medication(s)/side effects and non-pharmacologic comfort measures Outcome: Progressing   Problem: Health Behavior/Discharge Planning: Goal: Ability to manage health-related needs will improve Outcome: Progressing   Problem: Clinical Measurements: Goal: Ability to maintain clinical measurements within normal limits will improve Outcome: Progressing Goal: Diagnostic test results will improve Outcome: Progressing   Problem: Nutrition: Goal: Adequate nutrition will be maintained Outcome: Progressing   Problem: Coping: Goal: Level of anxiety will decrease Outcome: Progressing   Problem: Elimination: Goal: Will not experience complications related to bowel motility Outcome: Progressing Goal: Will not experience complications related to urinary retention Outcome: Progressing   Problem: Pain Managment: Goal: General experience of comfort will improve Outcome: Progressing   Problem: Safety: Goal: Ability to remain free from injury will improve Outcome: Progressing   Problem: Skin Integrity: Goal: Risk for impaired skin integrity will decrease Outcome: Progressing

## 2023-03-14 NOTE — Group Note (Signed)
Date:  03/14/2023 Time:  11:42 AM  Group Topic/Focus:  Making Healthy Choices:   The focus of this group is to help patients identify negative/unhealthy choices they were using prior to admission and identify positive/healthier coping strategies to replace them upon discharge.    Participation Level:  Did Not Attend   Ardelle Anton 03/14/2023, 11:42 AM

## 2023-03-14 NOTE — Group Note (Signed)
Recreation Therapy Group Note   Group Topic:General Recreation  Group Date: 03/14/2023 Start Time: 1400 End Time: 1455 Facilitators: Rosina Lowenstein, LRT, CTRS Location:  Day Room  Group Description: Bingo. LRT and patients played multiple games of Bingo with music playing in the background. LRT and pts discussed how this could be a leisure interest and the importance of doing things they enjoy post-discharge. Pts won stress balls and [employer] as Chief Financial Officer.   Goal Area(s) Addressed: Patient will identify leisure interests.  Patient will practice healthy decision making. Patient will engage in recreation activity.  Patient will increase communication.    Affect/Mood: Appropriate   Participation Level: Active and Engaged   Participation Quality: Independent   Behavior: Calm and Cooperative   Speech/Thought Process: Coherent   Insight: Good   Judgement: Good   Modes of Intervention: Cooperative Play, Guided Discussion, and Rapport Building   Patient Response to Interventions:  Attentive, Engaged, and Receptive   Education Outcome:  Acknowledges education   Clinical Observations/Individualized Feedback: Zasha was active in their participation of session activities and group discussion. Pt won a Facilities manager and respectfully declined a prize since she has played many times before. Pt interacted well with LRT and peers duration of session.   Plan: Continue to engage patient in RT group sessions 2-3x/week.   Rosina Lowenstein, LRT, CTRS 03/14/2023 4:46 PM

## 2023-03-15 DIAGNOSIS — F250 Schizoaffective disorder, bipolar type: Secondary | ICD-10-CM | POA: Diagnosis not present

## 2023-03-15 NOTE — Progress Notes (Signed)
   03/15/23 1000  Psych Admission Type (Psych Patients Only)  Admission Status Voluntary  Psychosocial Assessment  Patient Complaints Sadness  Eye Contact Fair  Facial Expression Flat  Affect Depressed  Speech Logical/coherent  Interaction Minimal  Motor Activity Slow  Appearance/Hygiene In scrubs  Behavior Characteristics Cooperative  Mood Sad  Thought Process  Coherency WDL  Content WDL  Delusions None reported or observed  Perception WDL  Hallucination Auditory  Judgment Impaired  Confusion None  Danger to Self  Current suicidal ideation? Denies  Agreement Not to Harm Self Yes  Description of Agreement verbal  Danger to Others  Danger to Others None reported or observed  Danger to Others Abnormal  Harmful Behavior to others No threats or harm toward other people  Destructive Behavior No threats or harm toward property

## 2023-03-15 NOTE — Plan of Care (Signed)
  Problem: Clinical Measurements: Goal: Ability to maintain clinical measurements within normal limits will improve Outcome: Progressing   Problem: Nutrition: Goal: Adequate nutrition will be maintained Outcome: Progressing   Problem: Coping: Goal: Level of anxiety will decrease Outcome: Progressing   Problem: Elimination: Goal: Will not experience complications related to bowel motility Outcome: Progressing Goal: Will not experience complications related to urinary retention Outcome: Progressing   Problem: Pain Managment: Goal: General experience of comfort will improve Outcome: Progressing   Problem: Safety: Goal: Ability to remain free from injury will improve Outcome: Progressing

## 2023-03-15 NOTE — Progress Notes (Signed)
Pt received in bed talking to herself states that she's sick of this place and she's ready to leave. Pt isolates in her room. Incont of urine. Dishelved and unkempt. Limited interaction with peers and staff. Po med compliant. No behavior issues noted. Denies SI/HI. Q 15 min checks maintained for safety. No c/o pain/discomfort noted.

## 2023-03-15 NOTE — Group Note (Signed)
Date:  03/15/2023 Time:  11:14 AM  Group Topic/Focus:  Self Care:   The focus of this group is to help patients understand the importance of self-care in order to improve or restore emotional, physical, spiritual, interpersonal, and financial health.    Participation Level:  Did Not Attend  Participation Quality:    Affect:   Cognitive:    Insight:   Engagement in Group:    Modes of Intervention:    Additional Comments:    Audon Heymann 03/15/2023, 11:14 AM

## 2023-03-15 NOTE — Progress Notes (Signed)
[facility] MD Progress Note  03/15/2023 1:22 PM Mirna Defreitas  MRN:  [id number] Subjective: Betty Henderson is seen on rounds.  She has been compliant with medications.  She denies any side effects.  She is pleasant and cooperative.  Nurses report no issues. Principal Problem: Schizoaffective disorder, bipolar type (HCC) Diagnosis: Principal Problem:   Schizoaffective disorder, bipolar type (HCC)  Total Time spent with patient: 15 minutes  Past Psychiatric History: Schizoaffective disorder  Past Medical History:  Past Medical History:  Diagnosis Date   Anemia    Arthritis    Chronic pain    Drug-seeking behavior    Malingering    Osteopetrosis    Psychosis (HCC)    Schizoaffective disorder, bipolar type (HCC)     Past Surgical History:  Procedure Laterality Date   MOUTH SURGERY     TUBAL LIGATION     Family History:  Family History  Family history unknown: Yes   Family Psychiatric  History: Unremarkable Social History:  Social History   Substance and Sexual Activity  Alcohol Use Not Currently   Comment: 1 cocktail 3 weeks ago     Social History   Substance and Sexual Activity  Drug Use Not Currently   Types: Cocaine   Comment: states "it's legal though"    Social History   Socioeconomic History   Marital status: Single    Spouse name: Not on file   Number of children: Not on file   Years of education: Not on file   Highest education level: Not on file  Occupational History   Not on file  Tobacco Use   Smoking status: Every Day    Current packs/day: 0.50    Types: Cigarettes   Smokeless tobacco: Never  Vaping Use   Vaping status: Never Used  Substance and Sexual Activity   Alcohol use: Not Currently    Comment: 1 cocktail 3 weeks ago   Drug use: Not Currently    Types: Cocaine    Comment: states "it's legal though"   Sexual activity: Never  Other Topics Concern   Not on file  Social History Narrative   Not on file   Social Drivers of Health    Financial Resource Strain: Not on file  Food Insecurity: Food Insecurity Present (08/22/2022)   Hunger Vital Sign    Worried About Running Out of Food in the Last Year: Sometimes true    Ran Out of Food in the Last Year: Sometimes true  Transportation Needs: Patient Unable To Answer (08/22/2022)   PRAPARE - Transportation    Lack of Transportation (Medical): Patient unable to answer    Lack of Transportation (Non-Medical): Patient unable to answer  Recent Concern: Transportation Needs - Unmet Transportation Needs (06/13/2022)   Received from [facility], [facility]   [facility] - Transportation    Lack of Transportation (Medical): Not on file    Lack of Transportation (Non-Medical): Yes  Physical Activity: Not on file  Stress: Not on file  Social Connections: Unknown (08/06/2021)   Received from [facility], [facility]   Social Network    Social Network: Not on file   Additional Social History:                         Sleep: Good  Appetite:  Good  Current Medications: Current Facility-Administered Medications  Medication Dose Route Frequency Provider Last Rate Last Admin   acetaminophen (TYLENOL) tablet 650 mg  650 mg Oral Q6H PRN  [name], DO   650 mg at 03/11/23 6045   alum & mag hydroxide-simeth (MAALOX/MYLANTA) 200-200-20 MG/5ML suspension 30 mL  30 mL Oral Q4H PRN [name], DO   30 mL at 02/28/23 0153   aspirin EC tablet 81 mg  81 mg Oral Daily [name], MD   81 mg at 03/15/23 0919   atorvastatin (LIPITOR) tablet 20 mg  20 mg Oral Daily [name], MD   20 mg at 03/15/23 4098   diphenhydrAMINE (BENADRYL) capsule 50 mg  50 mg Oral Q6H PRN [name], DO   50 mg at 09/29/22 2254   Or   diphenhydrAMINE (BENADRYL) injection 50 mg  50 mg Intramuscular Q6H PRN [name], DO       glimepiride (AMARYL) tablet 8 mg  8 mg Oral Q breakfast [name], MD   8 mg at 03/15/23 0759   haloperidol (HALDOL)  tablet 5 mg  5 mg Oral Q6H PRN [name], DO   5 mg at 09/28/22 2149   Or   haloperidol lactate (HALDOL) injection 5 mg  5 mg Intramuscular Q6H PRN [name], DO       ibuprofen (ADVIL) tablet 600 mg  600 mg Oral Q6H PRN [name], [name], MD   600 mg at 03/05/23 1191   linagliptin (TRADJENTA) tablet 5 mg  5 mg Oral Daily [name], MD   5 mg at 03/15/23 0919   LORazepam (ATIVAN) tablet 1 mg  1 mg Oral TID PRN [name], DO   1 mg at 02/24/23 2139   magnesium hydroxide (MILK OF MAGNESIA) suspension 30 mL  30 mL Oral Daily PRN [name], DO   30 mL at 03/04/23 1149   metFORMIN (GLUCOPHAGE) tablet 1,000 mg  1,000 mg Oral BH-q8a4p [name], DO   1,000 mg at 03/15/23 0800   ondansetron (ZOFRAN) tablet 4 mg  4 mg Oral Q4H PRN [name], MD   4 mg at 02/27/23 2016   oxybutynin (DITROPAN) tablet 5 mg  5 mg Oral BID [name], MD   5 mg at 03/15/23 0919   paliperidone (INVEGA SUSTENNA) injection 156 mg  156 mg Intramuscular Q28 days [name], MD   156 mg at 03/12/23 1516   pantoprazole (PROTONIX) EC tablet 80 mg  80 mg Oral Daily [name], DO   80 mg at 03/15/23 0919   topiramate (TOPAMAX) tablet 50 mg  50 mg Oral BH-q8a4p [name], DO   50 mg at 03/15/23 0800   traZODone (DESYREL) tablet 50 mg  50 mg Oral QHS PRN [name], DO   50 mg at 03/13/23 2121   zonisamide (ZONEGRAN) capsule 100 mg  100 mg Oral QHS [name], DO   100 mg at 03/14/23 2142    Lab Results: No results found for this or any previous visit (from the past 48 hours).  Blood Alcohol level:  Lab Results  Component Value Date   ETH <10 08/17/2022   ETH <10 01/11/2021    Metabolic Disorder Labs: Lab Results  Component Value Date   HGBA1C 11.0 (H) 01/27/2023   MPG 269 01/27/2023   No results found for: "PROLACTIN" Lab Results  Component Value Date   CHOL 259 (H) 01/27/2023    TRIG 155 (H) 01/27/2023   HDL 65 01/27/2023   CHOLHDL 4.0 01/27/2023   VLDL 31 01/27/2023   LDLCALC 163 (  H) 01/27/2023    Physical Findings: AIMS:  , ,  ,  ,    CIWA:    COWS:     Musculoskeletal: Strength & Muscle Tone: within normal limits Gait & Station: normal Patient leans: N/A  Psychiatric Specialty Exam:  Presentation  General Appearance:  Casual; Neat  Eye Contact: Fair  Speech: Slow; Slurred  Speech Volume: Decreased  Handedness: Right   Mood and Affect  Mood: Anxious  Affect: Inappropriate   Thought Process  Thought Processes: Disorganized  Descriptions of Associations:Loose  Orientation:Partial  Thought Content:Illogical; Rumination  History of Schizophrenia/Schizoaffective disorder:No data recorded Duration of Psychotic Symptoms:No data recorded Hallucinations:No data recorded Ideas of Reference:Paranoia  Suicidal Thoughts:No data recorded Homicidal Thoughts:No data recorded  Sensorium  Memory: Immediate Fair; Remote Poor  Judgment: Poor  Insight: Poor   Executive Functions  Concentration: Poor  Attention Span: Fair  Recall: [employer] of Knowledge: Fair  Language: Fair   Psychomotor Activity  Psychomotor Activity:No data recorded  Assets  Assets: Communication Skills   Sleep  Sleep:No data recorded    Blood pressure 103/68, pulse 67, temperature 98.3 F (36.8 C), resp. rate 20, height 5\' 6"  (1.676 m), weight 86 kg, SpO2 96%. Body mass index is 30.59 kg/m.   Treatment Plan Summary: Daily contact with patient to assess and evaluate symptoms and progress in treatment continue current medication.  Jerrik Housholder Tresea Mall, DO 03/15/2023, 1:22 PM

## 2023-03-15 NOTE — Plan of Care (Signed)
  Problem: Clinical Measurements: Goal: Ability to maintain clinical measurements within normal limits will improve Outcome: Progressing Goal: Diagnostic test results will improve Outcome: Progressing   Problem: Nutrition: Goal: Adequate nutrition will be maintained Outcome: Progressing   Problem: Coping: Goal: Level of anxiety will decrease Outcome: Progressing   Problem: Elimination: Goal: Will not experience complications related to bowel motility Outcome: Progressing Goal: Will not experience complications related to urinary retention Outcome: Progressing   Problem: Pain Managment: Goal: General experience of comfort will improve Outcome: Progressing

## 2023-03-15 NOTE — Group Note (Signed)
Date:  03/15/2023 Time:  8:50 PM  Group Topic/Focus:  Healthy Communication:   The focus of this group is to discuss communication, barriers to communication, as well as healthy ways to communicate with others.    Participation Level:  Active  Participation Quality:  Appropriate  Affect:  Appropriate  Cognitive:  Appropriate  Insight: Good  Engagement in Group:  Engaged  Modes of Intervention:  Discussion  Additional Comments:    [name] 03/15/2023, 8:50 PM

## 2023-03-15 NOTE — Plan of Care (Signed)
  Problem: Pain Managment: Goal: General experience of comfort will improve Outcome: Progressing   Problem: Safety: Goal: Ability to remain free from injury will improve Outcome: Progressing   

## 2023-03-16 DIAGNOSIS — F250 Schizoaffective disorder, bipolar type: Secondary | ICD-10-CM | POA: Diagnosis not present

## 2023-03-16 NOTE — Group Note (Signed)
[facility] LCSW Group Therapy Note   Group Date: 03/16/2023 Start Time: 1320 End Time: 1400   Type of Therapy/Topic:  Group Therapy:  Balance in Life  Participation Level:  Active   Description of Group:    This group will address the concept of balance and how it feels and looks when one is unbalanced. Patients will be encouraged to process areas in their lives that are out of balance, and identify reasons for remaining unbalanced. Facilitators will guide patients utilizing problem- solving interventions to address and correct the stressor making their life unbalanced. Understanding and applying boundaries will be explored and addressed for obtaining  and maintaining a balanced life. Patients will be encouraged to explore ways to assertively make their unbalanced needs known to significant others in their lives, using other group members and facilitator for support and feedback.  Therapeutic Goals: Patient will identify two or more emotions or situations they have that consume much of in their lives. Patient will identify signs/triggers that life has become out of balance:  Patient will identify two ways to set boundaries in order to achieve balance in their lives:  Patient will demonstrate ability to communicate their needs through discussion and/or role plays  Summary of Patient Progress:     Patient was alert and active in group. Patient was able to describe the the methods they use to create a more balanced lifestyle.     Therapeutic Modalities:   Cognitive Behavioral Therapy Solution-Focused Therapy Assertiveness Training   Whitney Post, [address]

## 2023-03-16 NOTE — Progress Notes (Signed)
[facility] MD Progress Note  03/16/2023 1:15 PM Betty Henderson  MRN:  [id number] Subjective: Betty Henderson is seen on rounds.  No changes with her situation.  Social work continues to work with adult protective services for placement.  She has been pleasant and cooperative on the unit.  Good controls.  Nurses report no issues. Principal Problem: Schizoaffective disorder, bipolar type (HCC) Diagnosis: Principal Problem:   Schizoaffective disorder, bipolar type (HCC)  Total Time spent with patient: 15 minutes  Past Psychiatric History: Schizoaffective disorder  Past Medical History:  Past Medical History:  Diagnosis Date   Anemia    Arthritis    Chronic pain    Drug-seeking behavior    Malingering    Osteopetrosis    Psychosis (HCC)    Schizoaffective disorder, bipolar type (HCC)     Past Surgical History:  Procedure Laterality Date   MOUTH SURGERY     TUBAL LIGATION     Family History:  Family History  Family history unknown: Yes   Family Psychiatric  History: Unremarkable Social History:  Social History   Substance and Sexual Activity  Alcohol Use Not Currently   Comment: 1 cocktail 3 weeks ago     Social History   Substance and Sexual Activity  Drug Use Not Currently   Types: Cocaine   Comment: states "it's legal though"    Social History   Socioeconomic History   Marital status: Single    Spouse name: Not on file   Number of children: Not on file   Years of education: Not on file   Highest education level: Not on file  Occupational History   Not on file  Tobacco Use   Smoking status: Every Day    Current packs/day: 0.50    Types: Cigarettes   Smokeless tobacco: Never  Vaping Use   Vaping status: Never Used  Substance and Sexual Activity   Alcohol use: Not Currently    Comment: 1 cocktail 3 weeks ago   Drug use: Not Currently    Types: Cocaine    Comment: states "it's legal though"   Sexual activity: Never  Other Topics Concern   Not on file  Social  History Narrative   Not on file   Social Drivers of Health   Financial Resource Strain: Not on file  Food Insecurity: Food Insecurity Present (08/22/2022)   Hunger Vital Sign    Worried About Running Out of Food in the Last Year: Sometimes true    Ran Out of Food in the Last Year: Sometimes true  Transportation Needs: Patient Unable To Answer (08/22/2022)   PRAPARE - Transportation    Lack of Transportation (Medical): Patient unable to answer    Lack of Transportation (Non-Medical): Patient unable to answer  Recent Concern: Transportation Needs - Unmet Transportation Needs (06/13/2022)   Received from [facility], [facility]   [facility] - Transportation    Lack of Transportation (Medical): Not on file    Lack of Transportation (Non-Medical): Yes  Physical Activity: Not on file  Stress: Not on file  Social Connections: Unknown (08/06/2021)   Received from [facility], [facility]   Social Network    Social Network: Not on file   Additional Social History:                         Sleep: Good  Appetite:  Good  Current Medications: Current Facility-Administered Medications  Medication Dose Route Frequency Provider Last Rate Last Admin  acetaminophen (TYLENOL) tablet 650 mg  650 mg Oral Q6H PRN [name], DO   650 mg at 03/11/23 8119   alum & mag hydroxide-simeth (MAALOX/MYLANTA) 200-200-20 MG/5ML suspension 30 mL  30 mL Oral Q4H PRN [name], DO   30 mL at 02/28/23 0153   aspirin EC tablet 81 mg  81 mg Oral Daily [name], MD   81 mg at 03/16/23 1478   atorvastatin (LIPITOR) tablet 20 mg  20 mg Oral Daily [name], MD   20 mg at 03/16/23 2956   diphenhydrAMINE (BENADRYL) capsule 50 mg  50 mg Oral Q6H PRN [name], DO   50 mg at 09/29/22 2254   Or   diphenhydrAMINE (BENADRYL) injection 50 mg  50 mg Intramuscular Q6H PRN [name], DO       glimepiride (AMARYL) tablet 8 mg  8 mg Oral Q breakfast  [name], MD   8 mg at 03/16/23 0818   haloperidol (HALDOL) tablet 5 mg  5 mg Oral Q6H PRN [name], DO   5 mg at 09/28/22 2149   Or   haloperidol lactate (HALDOL) injection 5 mg  5 mg Intramuscular Q6H PRN [name], DO       ibuprofen (ADVIL) tablet 600 mg  600 mg Oral Q6H PRN [name], [name], MD   600 mg at 03/05/23 2130   linagliptin (TRADJENTA) tablet 5 mg  5 mg Oral Daily [name], MD   5 mg at 03/16/23 8657   LORazepam (ATIVAN) tablet 1 mg  1 mg Oral TID PRN [name], DO   1 mg at 02/24/23 2139   magnesium hydroxide (MILK OF MAGNESIA) suspension 30 mL  30 mL Oral Daily PRN [name], DO   30 mL at 03/04/23 1149   metFORMIN (GLUCOPHAGE) tablet 1,000 mg  1,000 mg Oral BH-q8a4p [name], DO   1,000 mg at 03/16/23 0818   ondansetron (ZOFRAN) tablet 4 mg  4 mg Oral Q4H PRN [name], MD   4 mg at 02/27/23 2016   oxybutynin (DITROPAN) tablet 5 mg  5 mg Oral BID [name], MD   5 mg at 03/16/23 0925   paliperidone (INVEGA SUSTENNA) injection 156 mg  156 mg Intramuscular Q28 days [name], MD   156 mg at 03/12/23 1516   pantoprazole (PROTONIX) EC tablet 80 mg  80 mg Oral Daily [name], DO   80 mg at 03/16/23 0925   topiramate (TOPAMAX) tablet 50 mg  50 mg Oral BH-q8a4p [name], DO   50 mg at 03/16/23 0818   traZODone (DESYREL) tablet 50 mg  50 mg Oral QHS PRN [name], DO   50 mg at 03/13/23 2121   zonisamide (ZONEGRAN) capsule 100 mg  100 mg Oral QHS [name], DO   100 mg at 03/15/23 2128    Lab Results: No results found for this or any previous visit (from the past 48 hours).  Blood Alcohol level:  Lab Results  Component Value Date   ETH <10 08/17/2022   ETH <10 01/11/2021    Metabolic Disorder Labs: Lab Results  Component Value Date   HGBA1C 11.0 (H) 01/27/2023   MPG 269 01/27/2023   No results found for:  "PROLACTIN" Lab Results  Component Value Date   CHOL 259 (H) 01/27/2023   TRIG 155 (H) 01/27/2023   HDL 65 01/27/2023   CHOLHDL  4.0 01/27/2023   VLDL 31 01/27/2023   LDLCALC 163 (H) 01/27/2023    Physical Findings: AIMS:  , ,  ,  ,    CIWA:    COWS:     Musculoskeletal: Strength & Muscle Tone: within normal limits Gait & Station: normal Patient leans: N/A  Psychiatric Specialty Exam:  Presentation  General Appearance:  Casual; Neat  Eye Contact: Fair  Speech: Slow; Slurred  Speech Volume: Decreased  Handedness: Right   Mood and Affect  Mood: Anxious  Affect: Inappropriate   Thought Process  Thought Processes: Disorganized  Descriptions of Associations:Loose  Orientation:Partial  Thought Content:Illogical; Rumination  History of Schizophrenia/Schizoaffective disorder:No data recorded Duration of Psychotic Symptoms:No data recorded Hallucinations:No data recorded Ideas of Reference:Paranoia  Suicidal Thoughts:No data recorded Homicidal Thoughts:No data recorded  Sensorium  Memory: Immediate Fair; Remote Poor  Judgment: Poor  Insight: Poor   Executive Functions  Concentration: Poor  Attention Span: Fair  Recall: [employer] of Knowledge: Fair  Language: Fair   Psychomotor Activity  Psychomotor Activity:No data recorded  Assets  Assets: Communication Skills   Sleep  Sleep:No data recorded    Blood pressure (!) 95/56, pulse 89, temperature (!) 97.3 F (36.3 C), resp. rate 18, height 5\' 6"  (1.676 m), weight 86 kg, SpO2 100%. Body mass index is 30.59 kg/m.   Treatment Plan Summary: Daily contact with patient to assess and evaluate symptoms and progress in treatment, Medication management, and Plan continue current medications.  Lurleen Soltero Tresea Mall, DO 03/16/2023, 1:15 PM

## 2023-03-16 NOTE — Progress Notes (Signed)
   03/16/23 2200  Psych Admission Type (Psych Patients Only)  Admission Status Voluntary  Psychosocial Assessment  Patient Complaints Depression  Eye Contact Fair  Facial Expression Flat  Affect Depressed  Speech Logical/coherent  Interaction Minimal  Motor Activity Slow  Appearance/Hygiene In scrubs  Behavior Characteristics Cooperative  Mood Sad  Thought Process  Coherency WDL  Content WDL  Delusions None reported or observed  Perception WDL  Hallucination Auditory  Judgment Impaired  Confusion None  Danger to Self  Current suicidal ideation? Denies  Agreement Not to Harm Self Yes  Description of Agreement verbal  Danger to Others  Danger to Others None reported or observed  Danger to Others Abnormal  Harmful Behavior to others No threats or harm toward other people  Destructive Behavior No threats or harm toward property

## 2023-03-16 NOTE — Progress Notes (Signed)
   03/16/23 0630  15 Minute Checks  Location Bedroom  Visual Appearance Calm  Behavior Sleeping  Sleep (Behavioral Health Patients Only)  Calculate sleep? (Click Yes once per 24 hr at 0600 safety check) Yes  Documented sleep last 24 hours 15.25

## 2023-03-17 DIAGNOSIS — F250 Schizoaffective disorder, bipolar type: Secondary | ICD-10-CM | POA: Diagnosis not present

## 2023-03-17 NOTE — Plan of Care (Signed)

## 2023-03-17 NOTE — Progress Notes (Signed)
[facility] MD Progress Note  03/17/2023 1:29 PM Betty Henderson  MRN:  [id number] Subjective: Betty Henderson is seen on rounds.  There is been no change in her situation.  She has been compliant with medications and denies any side effects.  Social work continues to work with Adult Management consultant to find placement for her.  There was been in good controls and no problems.  She has no complaints.  Nurses report no issues. Principal Problem: Schizoaffective disorder, bipolar type (HCC) Diagnosis: Principal Problem:   Schizoaffective disorder, bipolar type (HCC)  Total Time spent with patient: 15 minutes  Past Psychiatric History: Schizoaffective disorder  Past Medical History:  Past Medical History:  Diagnosis Date   Anemia    Arthritis    Chronic pain    Drug-seeking behavior    Malingering    Osteopetrosis    Psychosis (HCC)    Schizoaffective disorder, bipolar type (HCC)     Past Surgical History:  Procedure Laterality Date   MOUTH SURGERY     TUBAL LIGATION     Family History:  Family History  Family history unknown: Yes   Family Psychiatric  History: Unremarkable Social History:  Social History   Substance and Sexual Activity  Alcohol Use Not Currently   Comment: 1 cocktail 3 weeks ago     Social History   Substance and Sexual Activity  Drug Use Not Currently   Types: Cocaine   Comment: states "it's legal though"    Social History   Socioeconomic History   Marital status: Single    Spouse name: Not on file   Number of children: Not on file   Years of education: Not on file   Highest education level: Not on file  Occupational History   Not on file  Tobacco Use   Smoking status: Every Day    Current packs/day: 0.50    Types: Cigarettes   Smokeless tobacco: Never  Vaping Use   Vaping status: Never Used  Substance and Sexual Activity   Alcohol use: Not Currently    Comment: 1 cocktail 3 weeks ago   Drug use: Not Currently    Types: Cocaine    Comment:  states "it's legal though"   Sexual activity: Never  Other Topics Concern   Not on file  Social History Narrative   Not on file   Social Drivers of Health   Financial Resource Strain: Not on file  Food Insecurity: Food Insecurity Present (08/22/2022)   Hunger Vital Sign    Worried About Running Out of Food in the Last Year: Sometimes true    Ran Out of Food in the Last Year: Sometimes true  Transportation Needs: Patient Unable To Answer (08/22/2022)   PRAPARE - Transportation    Lack of Transportation (Medical): Patient unable to answer    Lack of Transportation (Non-Medical): Patient unable to answer  Recent Concern: Transportation Needs - Unmet Transportation Needs (06/13/2022)   Received from [facility], [facility]   [facility] - Transportation    Lack of Transportation (Medical): Not on file    Lack of Transportation (Non-Medical): Yes  Physical Activity: Not on file  Stress: Not on file  Social Connections: Unknown (08/06/2021)   Received from [facility], [facility]   Social Network    Social Network: Not on file   Additional Social History:                         Sleep: Good  Appetite:  Good  Current Medications: Current Facility-Administered Medications  Medication Dose Route Frequency Provider Last Rate Last Admin   acetaminophen (TYLENOL) tablet 650 mg  650 mg Oral Q6H PRN [name], DO   650 mg at 03/17/23 0957   alum & mag hydroxide-simeth (MAALOX/MYLANTA) 200-200-20 MG/5ML suspension 30 mL  30 mL Oral Q4H PRN [name], DO   30 mL at 02/28/23 0153   aspirin EC tablet 81 mg  81 mg Oral Daily [name], MD   81 mg at 03/17/23 0957   atorvastatin (LIPITOR) tablet 20 mg  20 mg Oral Daily [name], MD   20 mg at 03/17/23 0956   diphenhydrAMINE (BENADRYL) capsule 50 mg  50 mg Oral Q6H PRN [name], DO   50 mg at 09/29/22 2254   Or   diphenhydrAMINE (BENADRYL) injection 50 mg  50 mg Intramuscular  Q6H PRN [name], DO       glimepiride (AMARYL) tablet 8 mg  8 mg Oral Q breakfast [name], MD   8 mg at 03/17/23 1610   haloperidol (HALDOL) tablet 5 mg  5 mg Oral Q6H PRN [name], DO   5 mg at 09/28/22 2149   Or   haloperidol lactate (HALDOL) injection 5 mg  5 mg Intramuscular Q6H PRN [name], DO       ibuprofen (ADVIL) tablet 600 mg  600 mg Oral Q6H PRN [name], [name], MD   600 mg at 03/05/23 9604   linagliptin (TRADJENTA) tablet 5 mg  5 mg Oral Daily [name], MD   5 mg at 03/17/23 0957   LORazepam (ATIVAN) tablet 1 mg  1 mg Oral TID PRN [name], DO   1 mg at 02/24/23 2139   magnesium hydroxide (MILK OF MAGNESIA) suspension 30 mL  30 mL Oral Daily PRN [name], DO   30 mL at 03/04/23 1149   metFORMIN (GLUCOPHAGE) tablet 1,000 mg  1,000 mg Oral BH-q8a4p [name], DO   1,000 mg at 03/17/23 0956   ondansetron (ZOFRAN) tablet 4 mg  4 mg Oral Q4H PRN [name], MD   4 mg at 02/27/23 2016   oxybutynin (DITROPAN) tablet 5 mg  5 mg Oral BID [name], MD   5 mg at 03/17/23 0956   paliperidone (INVEGA SUSTENNA) injection 156 mg  156 mg Intramuscular Q28 days [name], MD   156 mg at 03/12/23 1516   pantoprazole (PROTONIX) EC tablet 80 mg  80 mg Oral Daily [name], DO   80 mg at 03/17/23 0956   topiramate (TOPAMAX) tablet 50 mg  50 mg Oral BH-q8a4p [name], DO   50 mg at 03/17/23 5409   traZODone (DESYREL) tablet 50 mg  50 mg Oral QHS PRN [name], DO   50 mg at 03/16/23 2113   zonisamide (ZONEGRAN) capsule 100 mg  100 mg Oral QHS [name], DO   100 mg at 03/16/23 2113    Lab Results: No results found for this or any previous visit (from the past 48 hours).  Blood Alcohol level:  Lab Results  Component Value Date   [facility] <10 08/17/2022   ETH <10 01/11/2021    Metabolic Disorder Labs: Lab Results  Component  Value Date   HGBA1C 11.0 (H) 01/27/2023   MPG 269 01/27/2023   No results found for: "PROLACTIN" Lab Results  Component Value  Date   CHOL 259 (H) 01/27/2023   TRIG 155 (H) 01/27/2023   HDL 65 01/27/2023   CHOLHDL 4.0 01/27/2023   VLDL 31 01/27/2023   LDLCALC 163 (H) 01/27/2023    Physical Findings: AIMS:  , ,  ,  ,    CIWA:    COWS:     Musculoskeletal: Strength & Muscle Tone: within normal limits Gait & Station: normal Patient leans: N/A  Psychiatric Specialty Exam:  Presentation  General Appearance:  Casual; Neat  Eye Contact: Fair  Speech: Slow; Slurred  Speech Volume: Decreased  Handedness: Right   Mood and Affect  Mood: Anxious  Affect: Inappropriate   Thought Process  Thought Processes: Disorganized  Descriptions of Associations:Loose  Orientation:Partial  Thought Content:Illogical; Rumination  History of Schizophrenia/Schizoaffective disorder:No data recorded Duration of Psychotic Symptoms:No data recorded Hallucinations:No data recorded Ideas of Reference:Paranoia  Suicidal Thoughts:No data recorded Homicidal Thoughts:No data recorded  Sensorium  Memory: Immediate Fair; Remote Poor  Judgment: Poor  Insight: Poor   Executive Functions  Concentration: Poor  Attention Span: Fair  Recall: [employer] of Knowledge: Fair  Language: Fair   Psychomotor Activity  Psychomotor Activity:No data recorded  Assets  Assets: Communication Skills   Sleep  Sleep:No data recorded   MENTAL STATUS EXAM: Patient is alert and oriented x 3, pleasant and cooperative, good eye contact, speech is normal and not pressured, mood is depressed; affect is flat; thought process: goal directed; thought content: She denies suicidal ideation; judgment is poor, insight is poor.  Blood pressure (!) 88/55, pulse 76, temperature 98.5 F (36.9 C), resp. rate 18, height 5\' 6"  (1.676 m), weight 86 kg, SpO2 96%. Body mass index is 30.59  kg/m.   Treatment Plan Summary: Daily contact with patient to assess and evaluate symptoms and progress in treatment, Medication management, and Plan continue current medication.  [name], DO 03/17/2023, 1:29 PM

## 2023-03-17 NOTE — Plan of Care (Signed)
  Problem: Education: Goal: Knowledge of General Education information will improve Description: Including pain rating scale, medication(s)/side effects and non-pharmacologic comfort measures Outcome: Progressing   Problem: Health Behavior/Discharge Planning: Goal: Ability to manage health-related needs will improve Outcome: Progressing   Problem: Clinical Measurements: Goal: Ability to maintain clinical measurements within normal limits will improve Outcome: Progressing Goal: Diagnostic test results will improve Outcome: Progressing   Problem: Nutrition: Goal: Adequate nutrition will be maintained Outcome: Progressing   Problem: Coping: Goal: Level of anxiety will decrease Outcome: Progressing   Problem: Elimination: Goal: Will not experience complications related to bowel motility Outcome: Progressing Goal: Will not experience complications related to urinary retention Outcome: Progressing   Problem: Pain Managment: Goal: General experience of comfort will improve Outcome: Progressing   Problem: Safety: Goal: Ability to remain free from injury will improve Outcome: Progressing   Problem: Skin Integrity: Goal: Risk for impaired skin integrity will decrease Outcome: Progressing

## 2023-03-17 NOTE — Group Note (Signed)
Recreation Therapy Group Note   Group Topic:Health and Wellness  Group Date: 03/17/2023 Start Time: 1100 End Time: 1145 Facilitators: Rosina Lowenstein, LRT, CTRS Location:  Dayroom  Group Description: Seated Exercise. LRT discussed the mental and physical benefits of exercise. LRT and group discussed how physical activity can be used as a coping skill. Pt's and LRT followed along to an exercise video on the TV screen that provided a visual representation and audio description of every exercise performed. Pt's encouraged to listen to their bodies and stop at any time if they experience feelings of discomfort or pain. Pts were encouraged to drink water and stay hydrated.   Goal Area(s) Addressed: Patient will learn benefits of physical activity. Patient will identify exercise as a coping skill.  Patient will follow multistep directions.   Affect/Mood: Appropriate and Flat   Participation Level: Moderate   Participation Quality: Independent   Behavior: Alert   Speech/Thought Process: Coherent   Insight: Fair   Judgement: Fair    Modes of Intervention: Activity   Patient Response to Interventions:  Receptive   Education Outcome:  Acknowledges education   Clinical Observations/Individualized Feedback: Betty Henderson was somewhat active in their participation of session activities and group discussion. Pt completed some of the exercises as shown. Pt minimally interacted with LRT and peers while in group.    Plan: Continue to engage patient in RT group sessions 2-3x/week.   Rosina Lowenstein, LRT, CTRS 03/17/2023 12:33 PM

## 2023-03-17 NOTE — Group Note (Signed)
Recreation Therapy Group Note   Group Topic:Coping Skills  Group Date: 03/17/2023 Start Time: 1500 End Time: 1550 Facilitators: Rosina Lowenstein, LRT, CTRS Location:  Dayroom  Group Description: Coping A-Z. LRT and patients engage in a guided discussion on what coping skills are and gave specific examples. LRT passed out a handout labeled Coping A-Z with blank spaces beside each letter. LRT prompted patients to come up with a coping skill for each of the letters. LRT and patients went over the handout and gave ideas for each letter if anyone had any blanks left on their paper. Patients kept this handout with them that listed 26 different coping skills.   Goal Area(s) Addressed: Patients will be able to define "coping skills". Patient will identify new coping skills.  Patient will increase communication.   Affect/Mood: Appropriate   Participation Level: Active and Engaged   Participation Quality: Independent   Behavior: Calm and Cooperative   Speech/Thought Process: Coherent and Loose association   Insight: Good   Judgement: Good   Modes of Intervention: Education, Guided Discussion, and Worksheet   Patient Response to Interventions:  Attentive, Engaged, Interested , and Receptive   Education Outcome:  Acknowledges education   Clinical Observations/Individualized Feedback: Betty Henderson was active in their participation of session activities and group discussion. Pt identified "zoo, spa, camping, food court, and [employer]" as coping skills. Pt interacted well with LRT and peers duration of session.    Plan: Continue to engage patient in RT group sessions 2-3x/week.   Rosina Lowenstein, LRT, CTRS 03/17/2023 5:36 PM

## 2023-03-17 NOTE — Progress Notes (Signed)
   03/17/23 2000  Psych Admission Type (Psych Patients Only)  Admission Status Voluntary  Psychosocial Assessment  Patient Complaints Depression  Eye Contact Fair  Facial Expression Flat  Affect Depressed  Speech Logical/coherent  Interaction Minimal  Motor Activity Slow  Appearance/Hygiene In scrubs  Behavior Characteristics Cooperative;Appropriate to situation  Mood Sad  Thought Process  Coherency WDL  Content WDL  Delusions None reported or observed  Perception WDL  Hallucination Auditory  Judgment Impaired  Confusion None  Danger to Self  Current suicidal ideation? Denies  Agreement Not to Harm Self Yes  Description of Agreement verbal  Danger to Others  Danger to Others None reported or observed  Danger to Others Abnormal  Harmful Behavior to others No threats or harm toward other people  Destructive Behavior No threats or harm toward property

## 2023-03-17 NOTE — Group Note (Signed)
Date:  03/17/2023 Time:  11:13 AM  Group Topic/Focus:  Overcoming Stress:   The focus of this group is to define stress and help patients assess their triggers.    Participation Level:  Active  Participation Quality:  Appropriate  Affect:  Appropriate  Cognitive:  Appropriate  Insight: Appropriate  Engagement in Group:  Engaged  Modes of Intervention:  Discussion   Ardelle Anton 03/17/2023, 11:13 AM

## 2023-03-17 NOTE — Progress Notes (Signed)
   03/17/23 0710  Psych Admission Type (Psych Patients Only)  Admission Status Voluntary  Psychosocial Assessment  Patient Complaints Depression  Eye Contact Poor  Facial Expression Flat  Affect Depressed  Speech Logical/coherent  Interaction Minimal  Motor Activity Slow  Appearance/Hygiene Unremarkable  Behavior Characteristics Cooperative  Mood Sad  Thought Process  Coherency WDL  Content WDL  Delusions None reported or observed  Perception WDL  Hallucination Auditory  Judgment Impaired  Confusion None  Danger to Self  Current suicidal ideation? Denies  Danger to Others  Danger to Others None reported or observed

## 2023-03-18 DIAGNOSIS — F250 Schizoaffective disorder, bipolar type: Secondary | ICD-10-CM | POA: Diagnosis not present

## 2023-03-18 LAB — BASIC METABOLIC PANEL
Anion gap: 10 (ref 5–15)
BUN: 19 mg/dL (ref 8–23)
CO2: 22 mmol/L (ref 22–32)
Calcium: 9.2 mg/dL (ref 8.9–10.3)
Chloride: 104 mmol/L (ref 98–111)
Creatinine, Ser: 1.03 mg/dL — ABNORMAL HIGH (ref 0.44–1.00)
GFR, Estimated: >60 mL/min (ref >60)
Glucose, Bld: 204 mg/dL — ABNORMAL HIGH (ref 70–99)
Potassium: 3.9 mmol/L (ref 3.5–5.1)
Sodium: 136 mmol/L (ref 135–145)

## 2023-03-18 NOTE — Group Note (Signed)
Date:  03/18/2023 Time:  2:57 PM  Group Topic/Focus:  Holiday  Music and coloring     Participation Level:  Did Not Attend   Rodena Goldmann 03/18/2023, 2:57 PM

## 2023-03-18 NOTE — Group Note (Signed)
Date:  03/18/2023 Time:  2:15 AM  Group Topic/Focus:  Wrap-Up Group:   The focus of this group is to help patients review their daily goal of treatment and discuss progress on daily workbooks.    Participation Level:  Did Not Attend  Participation Quality:      Affect:      Cognitive:      Insight: None  Engagement in Group:  None  Modes of Intervention:      Additional Comments:    [name] 03/18/2023, 2:15 AM

## 2023-03-18 NOTE — Group Note (Signed)
Recreation Therapy Group Note   Group Topic:Leisure Education  Group Date: 03/18/2023 Start Time: 1100 End Time: 1200 Facilitators: Rosina Lowenstein, LRT, CTRS Location:  Dayroom  Group Description: Leisure. Patients were given the option to choose from singing karaoke, color, journaling, or listen to music. LRT and pts discussed the meaning of leisure, the importance of participating in leisure during their free time/when they're outside of the hospital, as well as how our leisure interests can also serve as coping skills.   Goal Area(s) Addressed:  Patient will identify a current leisure interest.  Patient will learn the definition of "leisure". Patient will practice making a positive decision. Patient will have the opportunity to try a new leisure activity. Patient will communicate with peers and LRT.    Affect/Mood: Appropriate   Participation Level: Active and Engaged   Participation Quality: Independent   Behavior: Appropriate, Calm, and Cooperative   Speech/Thought Process: Coherent   Insight: Fair   Judgement: Fair    Modes of Intervention: Activity, Education, Guided Discussion, Music, and Rapport Building   Patient Response to Interventions:  Attentive, Engaged, and Receptive   Education Outcome:  Acknowledges education   Clinical Observations/Individualized Feedback: Betty Henderson was active in their participation of session activities and group discussion. Pt came late to group, however, joined with no issue. Pt sang songs of karaoke while present in group. Pt interacted well with LRT and peers duration of session.    Plan: Continue to engage patient in RT group sessions 2-3x/week.   Rosina Lowenstein, LRT, CTRS 03/18/2023 12:54 PM

## 2023-03-18 NOTE — Plan of Care (Signed)
  Problem: Pain Managment: Goal: General experience of comfort will improve Outcome: Progressing   Problem: Safety: Goal: Ability to remain free from injury will improve Outcome: Progressing   Problem: Skin Integrity: Goal: Risk for impaired skin integrity will decrease Outcome: Progressing   

## 2023-03-18 NOTE — Plan of Care (Signed)
Betty Henderson is a 62 y.o. female patient. No diagnosis found. Past Medical History:  Diagnosis Date   Anemia    Arthritis    Chronic pain    Drug-seeking behavior    Malingering    Osteopetrosis    Psychosis (HCC)    Schizoaffective disorder, bipolar type (HCC)    Current Facility-Administered Medications  Medication Dose Route Frequency Provider Last Rate Last Admin   acetaminophen (TYLENOL) tablet 650 mg  650 mg Oral Q6H PRN [name], DO   650 mg at 03/17/23 0957   alum & mag hydroxide-simeth (MAALOX/MYLANTA) 200-200-20 MG/5ML suspension 30 mL  30 mL Oral Q4H PRN [name], DO   30 mL at 02/28/23 0153   aspirin EC tablet 81 mg  81 mg Oral Daily [name], MD   81 mg at 03/18/23 0939   atorvastatin (LIPITOR) tablet 20 mg  20 mg Oral Daily [name], MD   20 mg at 03/18/23 6295   diphenhydrAMINE (BENADRYL) capsule 50 mg  50 mg Oral Q6H PRN [name], DO   50 mg at 09/29/22 2254   Or   diphenhydrAMINE (BENADRYL) injection 50 mg  50 mg Intramuscular Q6H PRN [name], DO       glimepiride (AMARYL) tablet 8 mg  8 mg Oral Q breakfast [name], MD   8 mg at 03/18/23 2841   haloperidol (HALDOL) tablet 5 mg  5 mg Oral Q6H PRN [name], DO   5 mg at 09/28/22 2149   Or   haloperidol lactate (HALDOL) injection 5 mg  5 mg Intramuscular Q6H PRN [name], DO       ibuprofen (ADVIL) tablet 600 mg  600 mg Oral Q6H PRN [name], [name], MD   600 mg at 03/05/23 0829   linagliptin (TRADJENTA) tablet 5 mg  5 mg Oral Daily [name], MD   5 mg at 03/18/23 3244   LORazepam (ATIVAN) tablet 1 mg  1 mg Oral TID PRN [name], DO   1 mg at 02/24/23 2139   magnesium hydroxide (MILK OF MAGNESIA) suspension 30 mL  30 mL Oral Daily PRN [name], DO   30 mL at 03/04/23 1149   metFORMIN (GLUCOPHAGE) tablet 1,000 mg  1,000 mg Oral BH-q8a4p [name], DO   1,000 mg at  03/18/23 1609   ondansetron (ZOFRAN) tablet 4 mg  4 mg Oral Q4H PRN [name], MD   4 mg at 02/27/23 2016   oxybutynin (DITROPAN) tablet 5 mg  5 mg Oral BID [name], MD   5 mg at 03/18/23 0939   paliperidone (INVEGA SUSTENNA) injection 156 mg  156 mg Intramuscular Q28 days [name], MD   156 mg at 03/12/23 1516   pantoprazole (PROTONIX) EC tablet 80 mg  80 mg Oral Daily [name], DO   80 mg at 03/18/23 0939   topiramate (TOPAMAX) tablet 50 mg  50 mg Oral BH-q8a4p [name], DO   50 mg at 03/18/23 1608   traZODone (DESYREL) tablet 50 mg  50 mg Oral QHS PRN [name], DO   50 mg at 03/17/23 2144   zonisamide (ZONEGRAN) capsule 100 mg  100 mg Oral QHS [name], DO   100 mg at 03/17/23 2144   Allergies  Allergen Reactions   Other Itching    PT reports having environmental allergies which she used to receive  shots for, but cannot specify exact allergens   Lemon Oil Rash   Proanthocyanidin Rash   Strawberry Extract Rash   Adhesive  [Tape] Rash   Cherry Rash   Wound Dressing Adhesive Rash   Principal Problem:   Schizoaffective disorder, bipolar type (HCC)  Blood pressure (!) 110/56, pulse 84, temperature 98.2 F (36.8 C), resp. rate 18, height 5\' 6"  (1.676 m), weight 86 kg, SpO2 95%.  Subjective Objective Assessment & Plan  Zabdi Mis B Nahun Kronberg 03/18/2023

## 2023-03-18 NOTE — Progress Notes (Signed)
[facility] MD Progress Note  Betty Henderson  MRN:  [id number]  Subjective: Case discussed in multidisciplinary meeting today, chart reviewed, patient seen today during rounds.  Social worker informed that patient has been assigned a guardian via court, the final/official  court hearing is on Jan 14th, 2025  Chart reviewed, case discussed in multidisciplinary team today, patient seen during rounds. No new acute events overnight.  Patient reports she is doing "Ok".  Patient denies thoughts of harming herself or others.  Patient denies auditory or visual hallucinations.  Patient is awaiting placement.  Principal Problem: Schizoaffective disorder, bipolar type (HCC) Diagnosis: Principal Problem:   Schizoaffective disorder, bipolar type (HCC)   Past Psychiatric History: Schizoaffective disorder, bipolar type.  Past Medical History:  Past Medical History:  Diagnosis Date   Anemia    Arthritis    Chronic pain    Drug-seeking behavior    Malingering    Osteopetrosis    Psychosis (HCC)    Schizoaffective disorder, bipolar type (HCC)     Past Surgical History:  Procedure Laterality Date   MOUTH SURGERY     TUBAL LIGATION     Family History:  Family History  Family history unknown: Yes   Family Psychiatric  History: Unremarkable Social History:  Social History   Substance and Sexual Activity  Alcohol Use Not Currently   Comment: 1 cocktail 3 weeks ago     Social History   Substance and Sexual Activity  Drug Use Not Currently   Types: Cocaine   Comment: states "it's legal though"    Social History   Socioeconomic History   Marital status: Single    Spouse name: Not on file   Number of children: Not on file   Years of education: Not on file   Highest education level: Not on file  Occupational History   Not on file  Tobacco Use   Smoking status: Every Day    Current packs/day: 0.50    Types: Cigarettes   Smokeless tobacco: Never  Vaping Use   Vaping status: Never  Used  Substance and Sexual Activity   Alcohol use: Not Currently    Comment: 1 cocktail 3 weeks ago   Drug use: Not Currently    Types: Cocaine    Comment: states "it's legal though"   Sexual activity: Never  Other Topics Concern   Not on file  Social History Narrative   Not on file   Social Drivers of Health   Financial Resource Strain: Not on file  Food Insecurity: Food Insecurity Present (08/22/2022)   Hunger Vital Sign    Worried About Running Out of Food in the Last Year: Sometimes true    Ran Out of Food in the Last Year: Sometimes true  Transportation Needs: Patient Unable To Answer (08/22/2022)   PRAPARE - Transportation    Lack of Transportation (Medical): Patient unable to answer    Lack of Transportation (Non-Medical): Patient unable to answer  Recent Concern: Transportation Needs - Unmet Transportation Needs (06/13/2022)   Received from [facility], [facility]   [facility] - Transportation    Lack of Transportation (Medical): Not on file    Lack of Transportation (Non-Medical): Yes  Physical Activity: Not on file  Stress: Not on file  Social Connections: Unknown (08/06/2021)   Received from [facility], [facility]   Social Network    Social Network: Not on file   Additional Social History:     Patient is homeless at this time.  Social  worker is working with DSS on [employer] application and  placement.                    Sleep Good  Appetite:  Good  Current Medications: Current Facility-Administered Medications  Medication Dose Route Frequency Provider Last Rate Last Admin   acetaminophen (TYLENOL) tablet 650 mg  650 mg Oral Q6H PRN [name], DO   650 mg at 03/17/23 0957   alum & mag hydroxide-simeth (MAALOX/MYLANTA) 200-200-20 MG/5ML suspension 30 mL  30 mL Oral Q4H PRN [name], DO   30 mL at 02/28/23 0153   aspirin EC tablet 81 mg  81 mg Oral Daily [name], MD   81 mg at 03/18/23 8413   atorvastatin  (LIPITOR) tablet 20 mg  20 mg Oral Daily [name], MD   20 mg at 03/18/23 2440   diphenhydrAMINE (BENADRYL) capsule 50 mg  50 mg Oral Q6H PRN [name], DO   50 mg at 09/29/22 2254   Or   diphenhydrAMINE (BENADRYL) injection 50 mg  50 mg Intramuscular Q6H PRN [name], DO       glimepiride (AMARYL) tablet 8 mg  8 mg Oral Q breakfast [name], MD   8 mg at 03/18/23 1027   haloperidol (HALDOL) tablet 5 mg  5 mg Oral Q6H PRN [name], DO   5 mg at 09/28/22 2149   Or   haloperidol lactate (HALDOL) injection 5 mg  5 mg Intramuscular Q6H PRN [name], DO       ibuprofen (ADVIL) tablet 600 mg  600 mg Oral Q6H PRN [name], [name], MD   600 mg at 03/05/23 2536   linagliptin (TRADJENTA) tablet 5 mg  5 mg Oral Daily [name], MD   5 mg at 03/18/23 6440   LORazepam (ATIVAN) tablet 1 mg  1 mg Oral TID PRN [name], DO   1 mg at 02/24/23 2139   magnesium hydroxide (MILK OF MAGNESIA) suspension 30 mL  30 mL Oral Daily PRN [name], DO   30 mL at 03/04/23 1149   metFORMIN (GLUCOPHAGE) tablet 1,000 mg  1,000 mg Oral BH-q8a4p [name], DO   1,000 mg at 03/18/23 0938   ondansetron (ZOFRAN) tablet 4 mg  4 mg Oral Q4H PRN [name], MD   4 mg at 02/27/23 2016   oxybutynin (DITROPAN) tablet 5 mg  5 mg Oral BID [name], MD   5 mg at 03/18/23 0939   paliperidone (INVEGA SUSTENNA) injection 156 mg  156 mg Intramuscular Q28 days [name], MD   156 mg at 03/12/23 1516   pantoprazole (PROTONIX) EC tablet 80 mg  80 mg Oral Daily [name], DO   80 mg at 03/18/23 0939   topiramate (TOPAMAX) tablet 50 mg  50 mg Oral BH-q8a4p [name], DO   50 mg at 03/18/23 3474   traZODone (DESYREL) tablet 50 mg  50 mg Oral QHS PRN [name], DO   50 mg at 03/17/23 2144   zonisamide (ZONEGRAN) capsule 100 mg  100 mg Oral QHS [name], DO   100  mg at 03/17/23 2144    Lab Results:  Results for orders placed or performed during the hospital encounter of 08/22/22 (from the past 48 hours)  Basic metabolic panel     Status: Abnormal   Collection Time: 03/18/23  8:56 AM  Result Value Ref Range   Sodium 136 135 - 145 mmol/L   Potassium 3.9 3.5 - 5.1 mmol/L   Chloride 104 98 - 111 mmol/L   CO2 22 22 - 32 mmol/L   Glucose, Bld 204 (H) 70 - 99 mg/dL    Comment: Glucose reference range applies only to samples taken after fasting for at least 8 hours.   BUN 19 8 - 23 mg/dL   Creatinine, Ser 1.61 (H) 0.44 - 1.00 mg/dL   Calcium 9.2 8.9 - 09.6 mg/dL   GFR, Estimated >04 >54 mL/min    Comment: (NOTE) Calculated using the CKD-EPI Creatinine Equation (2021)    Anion gap 10 5 - 15    Comment: Performed at [facility], [address] [place], [place] [address]       Blood Alcohol level:  Lab Results  Component Value Date   [facility] <10 08/17/2022   ETH <10 01/11/2021      Musculoskeletal: Strength & Muscle Tone: within normal limits Gait & Station: normal Patient leans: N/A   Psychiatric Specialty Exam:   Presentation  General Appearance:  Casual; Neat   Eye Contact: Fair   Speech: Spontaneous   Speech Volume: Normal   Handedness: Right     Mood and Affect  Mood: "Fine"   Affect: Stable and pleasant    Thought Processes: Improved, at baseline   Descriptions of Associations: Intact   Orientation: Well-oriented   Thought Content: Improved, at baseline  Hallucinations:Denies  Ideas of Reference:None noted   Suicidal Thoughts:Denies SI, no intention or plan  Homicidal Thoughts:Denies HI, no intention or plan   Sensorium  Memory: Immediate Fair; Remote Poor   Judgment: Fair, pt is Rx compliant   Insight: Improved     Executive Functions  Concentration: Improved   Attention Span: Improved   Language: Fair     Psychomotor Activity  Psychomotor Activity:  Normal  Assets  Assets: Communication Skills     Sleep  Sleep:Improved     Physical Exam: Physical Exam Vitals and nursing note reviewed.  Constitutional:      Appearance: Normal appearance. She is normal weight.  Neurological:     General: No focal deficit present.     Mental Status: She is alert.      Review of Systems  Constitutional: Negative.   HENT: Negative.    Eyes: Negative.   Respiratory: Negative.    Cardiovascular: Negative.   Gastrointestinal: Negative.   Genitourinary: Negative.   Musculoskeletal: Negative.   Skin: Negative.   Neurological: Negative.   Endo/Heme/Allergies: Negative.     Blood pressure (!) 110/56, pulse 84, temperature 98.2 F (36.8 C), resp. rate 18, height 5\' 6"  (1.676 m), weight 86 kg, SpO2 95%. Body mass index is 30.59 kg/m.   Treatment Plan Summary: Daily contact with patient to assess and evaluate symptoms and progress in treatment, Medication management, and Plan continue current medications.   Continue to monitor patient on as needed meds   Patient received  dose of Invega Sustenna 156 mg IM on  03/12/23 Oxybutynin 5 mg by mouth twice daily to help with urinary incontinence Continue on Metformin and Amaryl.   Continue on atorvastatin for Mixed hyperlipidemia  Patient is awaiting placement    [name], MD

## 2023-03-19 DIAGNOSIS — F250 Schizoaffective disorder, bipolar type: Secondary | ICD-10-CM | POA: Diagnosis not present

## 2023-03-19 NOTE — Progress Notes (Signed)
   03/19/23 1500  Psych Admission Type (Psych Patients Only)  Admission Status Voluntary  Psychosocial Assessment  Patient Complaints Depression  Eye Contact Fair  Facial Expression Flat  Affect Depressed  Speech Logical/coherent  Interaction Minimal  Motor Activity Slow  Appearance/Hygiene In scrubs  Behavior Characteristics Cooperative;Appropriate to situation  Aggressive Behavior  Effect No apparent injury  Thought Process  Coherency WDL  Content WDL  Delusions None reported or observed  Perception WDL  Hallucination None reported or observed  Judgment WDL  Confusion WDL  Danger to Self  Current suicidal ideation? Denies  Agreement Not to Harm Self Yes  Description of Agreement Verbal  Danger to Others  Danger to Others None reported or observed  Danger to Others Abnormal  Harmful Behavior to others No threats or harm toward other people

## 2023-03-19 NOTE — Plan of Care (Signed)
  Problem: Education: Goal: Knowledge of General Education information will improve Description: Including pain rating scale, medication(s)/side effects and non-pharmacologic comfort measures Outcome: Not Met (add Reason)   Problem: Coping: Goal: Level of anxiety will decrease Outcome: Progressing

## 2023-03-19 NOTE — Progress Notes (Signed)
[facility] MD Progress Note  Betty Henderson  MRN:  [id number]  Subjective: Case discussed in multidisciplinary meeting today, chart reviewed, patient seen today during rounds.  Social worker informed that patient has been assigned a guardian via court, the final/official  court hearing is on Jan 14th, 2025  Chart reviewed, case discussed in multidisciplinary team today, patient seen during rounds. No new acute events overnight.  Patient is more visible in the area today.  Patient reports she is doing "Ok".  Patient denies thoughts of harming herself or others.  Patient denies auditory or visual hallucinations.  Patient is awaiting placement.  Principal Problem: Schizoaffective disorder, bipolar type (HCC) Diagnosis: Principal Problem:   Schizoaffective disorder, bipolar type (HCC)   Past Psychiatric History: Schizoaffective disorder, bipolar type.  Past Medical History:  Past Medical History:  Diagnosis Date   Anemia    Arthritis    Chronic pain    Drug-seeking behavior    Malingering    Osteopetrosis    Psychosis (HCC)    Schizoaffective disorder, bipolar type (HCC)     Past Surgical History:  Procedure Laterality Date   MOUTH SURGERY     TUBAL LIGATION     Family History:  Family History  Family history unknown: Yes   Family Psychiatric  History: Unremarkable Social History:  Social History   Substance and Sexual Activity  Alcohol Use Not Currently   Comment: 1 cocktail 3 weeks ago     Social History   Substance and Sexual Activity  Drug Use Not Currently   Types: Cocaine   Comment: states "it's legal though"    Social History   Socioeconomic History   Marital status: Single    Spouse name: Not on file   Number of children: Not on file   Years of education: Not on file   Highest education level: Not on file  Occupational History   Not on file  Tobacco Use   Smoking status: Every Day    Current packs/day: 0.50    Types: Cigarettes   Smokeless tobacco:  Never  Vaping Use   Vaping status: Never Used  Substance and Sexual Activity   Alcohol use: Not Currently    Comment: 1 cocktail 3 weeks ago   Drug use: Not Currently    Types: Cocaine    Comment: states "it's legal though"   Sexual activity: Never  Other Topics Concern   Not on file  Social History Narrative   Not on file   Social Drivers of Health   Financial Resource Strain: Not on file  Food Insecurity: Food Insecurity Present (08/22/2022)   Hunger Vital Sign    Worried About Running Out of Food in the Last Year: Sometimes true    Ran Out of Food in the Last Year: Sometimes true  Transportation Needs: Patient Unable To Answer (08/22/2022)   PRAPARE - Transportation    Lack of Transportation (Medical): Patient unable to answer    Lack of Transportation (Non-Medical): Patient unable to answer  Recent Concern: Transportation Needs - Unmet Transportation Needs (06/13/2022)   Received from [facility], [facility]   [facility] - Transportation    Lack of Transportation (Medical): Not on file    Lack of Transportation (Non-Medical): Yes  Physical Activity: Not on file  Stress: Not on file  Social Connections: Unknown (08/06/2021)   Received from [facility], [facility]   Social Network    Social Network: Not on file   Additional Social History:  Patient is homeless at this time.  Social worker is working with DSS on [employer] application and  placement.                    Sleep Good  Appetite:  Good  Current Medications: Current Facility-Administered Medications  Medication Dose Route Frequency Provider Last Rate Last Admin   acetaminophen (TYLENOL) tablet 650 mg  650 mg Oral Q6H PRN [name], DO   650 mg at 03/17/23 0957   alum & mag hydroxide-simeth (MAALOX/MYLANTA) 200-200-20 MG/5ML suspension 30 mL  30 mL Oral Q4H PRN [name], DO   30 mL at 02/28/23 0153   aspirin EC tablet 81 mg  81 mg Oral Daily [name], MD   81  mg at 03/19/23 0949   atorvastatin (LIPITOR) tablet 20 mg  20 mg Oral Daily [name], MD   20 mg at 03/19/23 1214   diphenhydrAMINE (BENADRYL) capsule 50 mg  50 mg Oral Q6H PRN [name], DO   50 mg at 09/29/22 2254   Or   diphenhydrAMINE (BENADRYL) injection 50 mg  50 mg Intramuscular Q6H PRN [name], DO       glimepiride (AMARYL) tablet 8 mg  8 mg Oral Q breakfast [name], MD   8 mg at 03/19/23 0946   haloperidol (HALDOL) tablet 5 mg  5 mg Oral Q6H PRN [name], DO   5 mg at 09/28/22 2149   Or   haloperidol lactate (HALDOL) injection 5 mg  5 mg Intramuscular Q6H PRN [name], DO       ibuprofen (ADVIL) tablet 600 mg  600 mg Oral Q6H PRN [name], [name], MD   600 mg at 03/05/23 4098   linagliptin (TRADJENTA) tablet 5 mg  5 mg Oral Daily [name], MD   5 mg at 03/19/23 1191   LORazepam (ATIVAN) tablet 1 mg  1 mg Oral TID PRN [name], DO   1 mg at 02/24/23 2139   magnesium hydroxide (MILK OF MAGNESIA) suspension 30 mL  30 mL Oral Daily PRN [name], DO   30 mL at 03/04/23 1149   metFORMIN (GLUCOPHAGE) tablet 1,000 mg  1,000 mg Oral BH-q8a4p [name], DO   1,000 mg at 03/19/23 0946   ondansetron (ZOFRAN) tablet 4 mg  4 mg Oral Q4H PRN [name], MD   4 mg at 02/27/23 2016   oxybutynin (DITROPAN) tablet 5 mg  5 mg Oral BID [name], MD   5 mg at 03/19/23 1154   paliperidone (INVEGA SUSTENNA) injection 156 mg  156 mg Intramuscular Q28 days [name], MD   156 mg at 03/12/23 1516   pantoprazole (PROTONIX) EC tablet 80 mg  80 mg Oral Daily [name], DO   80 mg at 03/19/23 0948   topiramate (TOPAMAX) tablet 50 mg  50 mg Oral BH-q8a4p [name], DO   50 mg at 03/19/23 0946   traZODone (DESYREL) tablet 50 mg  50 mg Oral QHS PRN [name], DO   50 mg at 03/17/23 2144   zonisamide (ZONEGRAN) capsule 100 mg  100 mg Oral  QHS [name], DO   100 mg at 03/18/23 2108    Lab Results:  Results for orders placed or performed during the hospital encounter of 08/22/22 (from the past 48 hours)  Basic metabolic panel     Status: Abnormal  Collection Time: 03/18/23  8:56 AM  Result Value Ref Range   Sodium 136 135 - 145 mmol/L   Potassium 3.9 3.5 - 5.1 mmol/L   Chloride 104 98 - 111 mmol/L   CO2 22 22 - 32 mmol/L   Glucose, Bld 204 (H) 70 - 99 mg/dL    Comment: Glucose reference range applies only to samples taken after fasting for at least 8 hours.   BUN 19 8 - 23 mg/dL   Creatinine, Ser 6.60 (H) 0.44 - 1.00 mg/dL   Calcium 9.2 8.9 - 63.0 mg/dL   GFR, Estimated >16 >01 mL/min    Comment: (NOTE) Calculated using the CKD-EPI Creatinine Equation (2021)    Anion gap 10 5 - 15    Comment: Performed at [facility], [address] [place], [place] [address]       Blood Alcohol level:  Lab Results  Component Value Date   [facility] <10 08/17/2022   ETH <10 01/11/2021      Musculoskeletal: Strength & Muscle Tone: within normal limits Gait & Station: normal Patient leans: N/A   Psychiatric Specialty Exam:   Presentation  General Appearance:  Casual; Neat   Eye Contact: Fair   Speech: Spontaneous   Speech Volume: Normal   Handedness: Right     Mood and Affect  Mood: "Fine"   Affect: Stable and pleasant    Thought Processes: Improved, at baseline   Descriptions of Associations: Intact   Orientation: Well-oriented   Thought Content: Improved, at baseline  Hallucinations:Denies  Ideas of Reference:None noted   Suicidal Thoughts:Denies SI, no intention or plan  Homicidal Thoughts:Denies HI, no intention or plan   Sensorium  Memory: Immediate Fair; Remote Poor   Judgment: Fair, pt is Rx compliant   Insight: Improved     Executive Functions  Concentration: Improved   Attention Span: Improved   Language: Fair     Psychomotor Activity   Psychomotor Activity: Normal  Assets  Assets: Communication Skills     Sleep  Sleep:Improved     Physical Exam: Physical Exam Vitals and nursing note reviewed.  Constitutional:      Appearance: Normal appearance. She is normal weight.  Neurological:     General: No focal deficit present.     Mental Status: She is alert.      Review of Systems  Constitutional: Negative.   HENT: Negative.    Eyes: Negative.   Respiratory: Negative.    Cardiovascular: Negative.   Gastrointestinal: Negative.   Genitourinary: Negative.   Musculoskeletal: Negative.   Skin: Negative.   Neurological: Negative.   Endo/Heme/Allergies: Negative.     Blood pressure 101/70, pulse 78, temperature 98 F (36.7 C), resp. rate 15, height 5\' 6"  (1.676 m), weight 86 kg, SpO2 95%. Body mass index is 30.59 kg/m.   Treatment Plan Summary: Daily contact with patient to assess and evaluate symptoms and progress in treatment, Medication management, and Plan continue current medications.   Continue to monitor patient on as needed meds   Patient received  dose of Invega Sustenna 156 mg IM on  03/12/23 Oxybutynin 5 mg by mouth twice daily to help with urinary incontinence Continue on Metformin and Amaryl.   Continue on atorvastatin for Mixed hyperlipidemia  Patient is awaiting placement    [name], MD

## 2023-03-19 NOTE — Progress Notes (Signed)
   03/18/23 2000  Psych Admission Type (Psych Patients Only)  Admission Status Voluntary  Psychosocial Assessment  Patient Complaints Depression  Eye Contact Fair  Facial Expression Flat  Affect Depressed  Speech Logical/coherent  Interaction Minimal  Motor Activity Slow  Appearance/Hygiene In scrubs  Behavior Characteristics Cooperative;Appropriate to situation  Aggressive Behavior  Effect No apparent injury  Thought Process  Coherency WDL  Content WDL  Delusions None reported or observed  Perception WDL  Hallucination None reported or observed  Judgment WDL  Confusion WDL  Danger to Self  Current suicidal ideation? Denies  Agreement Not to Harm Self Yes  Description of Agreement verbal  Danger to Others  Danger to Others None reported or observed  Danger to Others Abnormal  Harmful Behavior to others No threats or harm toward other people

## 2023-03-19 NOTE — BH IP Treatment Plan (Signed)
Interdisciplinary Treatment and Diagnostic Plan Update  03/19/2023 Time of Session: 10:00 Erik Mesaros MRN: [id number]  Principal Diagnosis: Schizoaffective disorder, bipolar type [facility])  Secondary Diagnoses: Principal Problem:   Schizoaffective disorder, bipolar type (HCC)   Current Medications:  Current Facility-Administered Medications  Medication Dose Route Frequency Provider Last Rate Last Admin   acetaminophen (TYLENOL) tablet 650 mg  650 mg Oral Q6H PRN [name], DO   650 mg at 03/17/23 0957   alum & mag hydroxide-simeth (MAALOX/MYLANTA) 200-200-20 MG/5ML suspension 30 mL  30 mL Oral Q4H PRN [name], DO   30 mL at 02/28/23 0153   aspirin EC tablet 81 mg  81 mg Oral Daily [name], MD   81 mg at 03/19/23 0949   atorvastatin (LIPITOR) tablet 20 mg  20 mg Oral Daily [name], MD   20 mg at 03/18/23 4010   diphenhydrAMINE (BENADRYL) capsule 50 mg  50 mg Oral Q6H PRN [name], DO   50 mg at 09/29/22 2254   Or   diphenhydrAMINE (BENADRYL) injection 50 mg  50 mg Intramuscular Q6H PRN [name], DO       glimepiride (AMARYL) tablet 8 mg  8 mg Oral Q breakfast [name], MD   8 mg at 03/19/23 0946   haloperidol (HALDOL) tablet 5 mg  5 mg Oral Q6H PRN [name], DO   5 mg at 09/28/22 2149   Or   haloperidol lactate (HALDOL) injection 5 mg  5 mg Intramuscular Q6H PRN [name], DO       ibuprofen (ADVIL) tablet 600 mg  600 mg Oral Q6H PRN [name], [name], MD   600 mg at 03/05/23 0829   linagliptin (TRADJENTA) tablet 5 mg  5 mg Oral Daily [name], MD   5 mg at 03/19/23 2725   LORazepam (ATIVAN) tablet 1 mg  1 mg Oral TID PRN [name], DO   1 mg at 02/24/23 2139   magnesium hydroxide (MILK OF MAGNESIA) suspension 30 mL  30 mL Oral Daily PRN [name], DO   30 mL at 03/04/23 1149   metFORMIN (GLUCOPHAGE) tablet 1,000 mg  1,000 mg Oral BH-q8a4p [name], DO   1,000 mg at 03/19/23 0946   ondansetron (ZOFRAN) tablet 4 mg  4 mg Oral Q4H PRN [name], MD   4 mg at 02/27/23 2016   oxybutynin (DITROPAN) tablet 5 mg  5 mg Oral BID [name], MD   5 mg at 03/18/23 2108   paliperidone (INVEGA SUSTENNA) injection 156 mg  156 mg Intramuscular Q28 days [name], MD   156 mg at 03/12/23 1516   pantoprazole (PROTONIX) EC tablet 80 mg  80 mg Oral Daily [name], DO   80 mg at 03/19/23 0948   topiramate (TOPAMAX) tablet 50 mg  50 mg Oral BH-q8a4p [name], DO   50 mg at 03/19/23 0946   traZODone (DESYREL) tablet 50 mg  50 mg Oral QHS PRN [name], DO   50 mg at 03/17/23 2144   zonisamide (ZONEGRAN) capsule 100 mg  100 mg Oral QHS [name], DO   100 mg at 03/18/23 2108   PTA Medications: Medications Prior to Admission  Medication Sig Dispense Refill Last Dose/Taking   ondansetron (ZOFRAN) 4 MG tablet Take 1 tablet (4 mg total) by mouth every 8 (eight) hours as needed for nausea or vomiting. (  Patient not taking: Reported on 08/17/2022) 20 tablet 0     Patient Stressors: Financial difficulties   Health problems   Medication change or noncompliance    Patient Strengths: Ability for insight   Treatment Modalities: Medication Management, Group therapy, Case management,  1 to 1 session with clinician, Psychoeducation, Recreational therapy.   Physician Treatment Plan for Primary Diagnosis: Schizoaffective disorder, bipolar type (HCC) Long Term Goal(s): Improvement in symptoms so as ready for discharge   Short Term Goals: Ability to identify changes in lifestyle to reduce recurrence of condition will improve Ability to verbalize feelings will improve Ability to disclose and discuss suicidal ideas Ability to demonstrate self-control will improve Ability to identify and develop effective coping behaviors will improve Ability to maintain clinical measurements  within normal limits will improve Compliance with prescribed medications will improve Ability to identify triggers associated with substance abuse/mental health issues will improve  Medication Management: Evaluate patient's response, side effects, and tolerance of medication regimen.  Therapeutic Interventions: 1 to 1 sessions, Unit Group sessions and Medication administration.  Evaluation of Outcomes: Progressing  Physician Treatment Plan for Secondary Diagnosis: Principal Problem:   Schizoaffective disorder, bipolar type (HCC)  Long Term Goal(s): Improvement in symptoms so as ready for discharge   Short Term Goals: Ability to identify changes in lifestyle to reduce recurrence of condition will improve Ability to verbalize feelings will improve Ability to disclose and discuss suicidal ideas Ability to demonstrate self-control will improve Ability to identify and develop effective coping behaviors will improve Ability to maintain clinical measurements within normal limits will improve Compliance with prescribed medications will improve Ability to identify triggers associated with substance abuse/mental health issues will improve     Medication Management: Evaluate patient's response, side effects, and tolerance of medication regimen.  Therapeutic Interventions: 1 to 1 sessions, Unit Group sessions and Medication administration.  Evaluation of Outcomes: Progressing   RN Treatment Plan for Primary Diagnosis: Schizoaffective disorder, bipolar type (HCC) Long Term Goal(s): Knowledge of disease and therapeutic regimen to maintain health will improve  Short Term Goals: Ability to remain free from injury will improve, Ability to verbalize frustration and anger appropriately will improve, Ability to demonstrate self-control, Ability to participate in decision making will improve, Ability to verbalize feelings will improve, Ability to disclose and discuss suicidal ideas, Ability to identify  and develop effective coping behaviors will improve, and Compliance with prescribed medications will improve  Medication Management: RN will administer medications as ordered by provider, will assess and evaluate patient's response and provide education to patient for prescribed medication. RN will report any adverse and/or side effects to prescribing provider.  Therapeutic Interventions: 1 on 1 counseling sessions, Psychoeducation, Medication administration, Evaluate responses to treatment, Monitor vital signs and CBGs as ordered, Perform/monitor CIWA, COWS, AIMS and Fall Risk screenings as ordered, Perform wound care treatments as ordered.  Evaluation of Outcomes: Progressing   LCSW Treatment Plan for Primary Diagnosis: Schizoaffective disorder, bipolar type (HCC) Long Term Goal(s): Safe transition to appropriate next level of care at discharge, Engage patient in therapeutic group addressing interpersonal concerns.  Short Term Goals: Engage patient in aftercare planning with referrals and resources, Increase social support, Increase ability to appropriately verbalize feelings, Increase emotional regulation, Facilitate acceptance of mental health diagnosis and concerns, Facilitate patient progression through stages of change regarding substance use diagnoses and concerns, Identify triggers associated with mental health/substance abuse issues, and Increase skills for wellness and recovery  Therapeutic Interventions: Assess for all discharge needs, 1 to 1 time  with Social worker, Explore available resources and support systems, Assess for adequacy in community support network, Educate family and significant other(s) on suicide prevention, Complete Psychosocial Assessment, Interpersonal group therapy.  Evaluation of Outcomes: Progressing   Progress in Treatment: Attending groups: Yes. and No. Participating in groups: Yes. and No. Taking medication as prescribed: Yes. Toleration medication:  Yes. Family/Significant other contact made: Yes, individual(s) contacted:  Conor Camhi, son, [phone]. Patient understands diagnosis: Yes. and No. Discussing patient identified problems/goals with staff: Yes. Medical problems stabilized or resolved: Yes. Denies suicidal/homicidal ideation: Yes. Issues/concerns per patient self-inventory: No. Other: none.   New problem(s) identified: No, Describe:  none Updates 12/12/22: No changes made at this time Updates 12/17/22: No changes made at this time Update 12/24/22 No changes at this time. Update 12/29/2022: No changes at this time. Update 01/03/23: No changes at this time Update 01/08/23: No changes at this time Update 01/13/23: No changes at this time Update 01/18/23: None at this time. Update 01/23/2023:  No changes at this time. Update 01/28/23: Pt recently diagnosed with Diabetes Type II  Update 02/02/23: None at this time. Update 02/07/23: No changes at this time Update 02/12/23: No changes at this time Update 02/17/23: No changes at this time. Update: 02/22/2023: No changes at this time. Update 02/27/23: No changes at this time Update 03/04/23: No changes at this time  Update 03/09/23: No changes at this time Update 03/14/23: No changes at this time. 03/19/23 Update: No changes at this time.      New Short Term/Long Term Goal(s): Update 6/30: none at this time. Update 09/27/2022:  No changes at this time.  Update 10/02/2022:  No changes at this time. Update 10/07/22: No changes at this time 10/12/22: No changes at this time Update 10/18/22: No changes at this time Update 10/23/22: No changes at this time Update 10/28/22: No changes at this time Update 11/07/22: No changes at this time Update 11/12/22: No changes at this time  Update 11/17/22: None at this time. Update 11/22/22: None at this time. Update 11/27/22 No changes at this time. Update 12/02/22 No changes at this time  Update 12/07/22: None at this time. Updates 12/12/22: No changes made at this time Updates  12/17/22: No changes made at this time Update 12/24/22 No changes at this time. Update 12/29/2022: No changes at this time. Update 01/03/23: No changes at this time Update 01/08/23: No changes at this time Update 01/13/23: No changes at this time   Update 01/18/23: No changes at this time. Update 01/23/2023:  No changes at this time. Update 01/28/2023:  No changes at this time. Update 02/02/23: None at this time.  Update 02/07/23: No changes at this time Update 02/12/23: No changes at this time Update 02/17/23: No changes at this time. Update: 02/22/2023: No changes at this time. Update 02/27/23: No changes at this time Update 03/04/23: No changes at this time Update 03/09/23: No changes at this time Update 03/14/23: No changes at this time. 03/19/23 Update: No changes at this time.     Patient Goals:  Update 6/30: none at this time. Update 09/27/2022:  No changes at this time. Update 10/02/2022:  No changes at this time. Update 10/07/22: No changes at this time 10/12/22: No changes at this time Update 10/18/22: No changes at this time Update 10/23/22: No changes at this time Update 10/28/22: No changes at this time Update 11/07/22: No changes at this time Update 11/12/22: No changes at this time Update 11/17/22: None at this  time. Update 11/22/22: None at this time. Update 11/27/22 No changes at this time Update 12/02/22 No changes at this time   Update 12/07/22: None at this time. Updates 12/12/22: No changes made at this time Updates 12/17/22: No changes made at this time Update 12/24/22 No changes at this time. Update 12/29/2022: No changes at this time. Update 01/03/23: No changes at this time Update 01/08/23: No changes at this time Update 01/13/23: No changes at this time   Update 01/18/23: No changes at this time. Update 01/23/2023:  No changes at this time.  Update 01/28/2023:  No changes at this time. Update 02/02/23: None at this time.  Update 02/07/23: No changes at this time Update 02/12/23: No changes at this time Update  02/17/23: No changes at this time. Update: 02/22/2023: No changes at this time. Update 02/27/23: No changes at this time Update 03/04/23: No changes at this time  Update 03/09/23: No changes at this time Update 03/14/23: No changes at this time. 03/19/23 Update: No changes at this time.      Discharge Plan or Barriers: Update 6/30: APS report has been made and patient is being investigated for guardianship needs.  Remains homeless with limited supports. Update 09/27/2022:  No changes at this time.  Update 10/02/2022:  Patient remains safe on the unit at this time.  Patient remains psychotic at this time.  APS report has been made, however, no follow up from the caseworker on her case.  No safe discharge identified.  CSW has requested that application for Medicaid and disability be completed.   Update 10/07/22: No changes at this time 10/12/22: No changes at this time Update 10/18/22: No changes at this time Update 10/23/22: No changes at this time Update 10/23/22: No changes at this time Update 10/28/22: According to [facility], pt's caseworker at DSS, the petition was filed for guardianship on 10/21/22, now awaiting for the petition to be approvedUpdate 11/07/22: No changes at this time Update 11/12/22: CSW sent over patients information to [facility] and [facility], awaiting a response. CSW awaiting word from DSS regarding pt's petition and what agency will become her guardian. Update 11/17/22: No changes at this time. Update 11/22/22: CSW has sent patients information to multiple facilities that were suggested by leadership, pt has been denied from the facility or the facility does not respond. CSW continues to send pt's information to nursing homes. Update 11/27/22 CSW contacted DSS to inquire about the status of guardianship. No response at this time Update 12/02/22 Supervisor Loraine Leriche contacted DSS who stated they are not taking pt on for guardianship but that Quincy Carnes will be present tomorrow to do a visit  with social worker and pt and that pt's son will be signing documentation for placement for the pt. Update 12/07/22: [facility] DSS has declined to take guardianship. Son, Casimiro Needle is to step into a more present role in deciding placement. A conversation is still needed. Updates 12/12/22: CSW staffed case with leadership and they state that a family meeting must happen with pt's son. CSW contacted pt's financial navigator and her DSS worker so that they may be present on the call and are able to speak to the efforts they have made to secure pt funding and housing. CSW waiting on responses from both, leadership is aware. Updates 12/17/22: CSW awaiting updates from both [facility] DSS and [facility] financial navigators as to status of pt's applications for Medicaid/[employer] and Disability, respectively. Update 12/24/22 CSW sent email regarding family  meeting per request of supervisors. CSW awaiting responses from all parties to schedule family meeting. Update: 12/29/2022 No changes at this time. Update 01/03/23: CSW continues to await responses on the scheduling of the family meeting. CSW received call from Primitivo Gauze at [employer], CSW sent FL2 to [facility] to aide in pt's discharge planning. Update 01/08/23: CSW has reached out again to leadership and financial counseling. CSW continues to await a response. CSW called Sharlee Blew, awaiting a response for status of pt's placement options. Update 01/13/23: Pt is to meet with psychologist tomorrow 10/22 to complete capacity testing so that DSS can retry for guardianship  Update 01/18/23: The patient is still awaiting placement.  Update 01/23/2023:  Patient remains safe on the unit at this time.  CSW team continues to work on placement.  CSW team in contact with DSS to develop plan on guardianship.   Work continues to Hospital doctor. Update 01/28/2023:  Medicaid application remains pending. DSS awaiting records from medical records to retry fo  guardianship.  Update 02/02/23: None at this time.  Update 02/07/23: Sharlee Blew at DSS confirms that pt records were received and that they are petitioning for guardianship, she informs CSW that decision should be made on court date by Monday or Tuesday of next week. CSW still awaits status update on Medicaid application Update 02/12/23: Medicaid Application still pending. TOC Supervisors recommend that CSW continue searching for placement as we await DSS taking guardianship. Update 02/17/23: DSS confirms guardianship date for January 14th, 2025. CSW continue to search for placement. Update: 02/22/2023: No changes at this time.  Update 02/27/23: CSW sent pt's FL2 to multiple different placements. Awaiting to hear back from placements. Update 03/04/23: No changes at this time  Update 03/09/23: No changes at this time Update 03/14/23: CSW still awaits to hear from placements for pt. Pt remains stable on the unit. According to DSS pt has been legally considered disabled but needs to complete disability application. CSW has reached out to financial navigators to complete this. 03/19/23 Update: No changes at this time.         Reason for Continuation of Hospitalization: Hallucinations Mania Medication stabilization   Estimated Length of Stay:  Update 6/30: 1-7 days Update 09/27/2022:  TBD Update 10/02/2022:  No changes at this time. UpdaUpdate 10/18/22: No changes at this time te 10/07/22: No changes at this time Update 10/18/22: No changes at this time Update 10/23/22: No changes at this time Update 10/28/22: No changes at this time Update 11/07/22: No changes at this time Update 11/12/22: No changes at this time  Update 11/17/22: No changes at this time. Update 11/22/22: None at this time. Update 11/27/22 No changes at this time. Update 12/02/22 No changes at this time  Update 12/07/22: None at this time. Updates 12/12/22: No changes made at this time Updates 12/17/22: TBD. Update 12/29/2022: No changes at this time. Update  01/03/23: TBDUpdate 01/08/23: TBD Update 01/13/23: TBD  Update 01/18/23: No changes at this time.  Update 01/23/2023:  TBD Update 01/28/2023:  TBD Update 02/02/23: None at this time. Update 02/07/23: TBD Update 02/12/23: TBD Update 02/17/23: TBD. Update: 02/22/2023: TBD. Update: 02/27/2023: TBD. Update: 03/04/2023: TBD. Update 03/09/23: TBD Update 03/14/23: TBD. 03/19/23 Update: TBD.  Last 3 [place] Suicide Severity Risk Score: Flowsheet Row Admission (Current) from 08/22/2022 in [facility] ED from 08/17/2022 in [facility] Emergency Department at [facility] ED from 08/12/2022 in [facility] Emergency Department at [facility]  C-SSRS RISK  CATEGORY Low Risk No Risk No Risk       Last PHQ 2/9 Scores:     No data to display          Scribe for Treatment Team: Glenis Smoker, LCSW 03/19/2023 10:46 AM

## 2023-03-19 NOTE — Plan of Care (Signed)
  Problem: Education: Goal: Knowledge of General Education information will improve Description Including pain rating scale, medication(s)/side effects and non-pharmacologic comfort measures Outcome: Progressing   Problem: Health Behavior/Discharge Planning: Goal: Ability to manage health-related needs will improve Outcome: Progressing   

## 2023-03-19 NOTE — Group Note (Signed)
Date:  03/19/2023 Time:  5:55 AM  Group Topic/Focus:  Rediscovering Joy:   The focus of this group is to explore various ways to relieve stress in a positive manner. Wrap-Up Group:   The focus of this group is to help patients review their daily goal of treatment and discuss progress on daily workbooks.    Participation Level:  Did Not Attend   Additional Comments:    [name] 03/19/2023, 5:55 AM

## 2023-03-19 NOTE — Group Note (Signed)
Date:  03/19/2023 Time:  11:22 AM  Group Topic/Focus:  Coloring/Word searches/Music Therapy  The purpose of this group is to allow patients to have leisure time while listening to [employer].     Participation Level:  Did Not Attend  Participation Quality:    Affect:    Cognitive:    Insight:   Engagement in Group:    Modes of Intervention:    Additional Comments:  Did not attend   Shauntee Karp T Trendon Zaring 03/19/2023, 11:22 AM

## 2023-03-20 DIAGNOSIS — F250 Schizoaffective disorder, bipolar type: Secondary | ICD-10-CM | POA: Diagnosis not present

## 2023-03-20 NOTE — Group Note (Signed)
Recreation Therapy Group Note   Group Topic:Relaxation  Group Date: 03/20/2023 Start Time: 1100 End Time: 1145 Facilitators: Rosina Lowenstein, LRT, CTRS Location:  Dayroom  Group Description: PMR (Progressive Muscle Relaxation). LRT educates patients on what PMR is and the benefits that come from it. Patients are asked to sit with their feet flat on the floor while sitting up and all the way back in their chair, if possible. LRT and pts follow a prompt through a speaker that requires you to tense and release different muscles in their body and focus on their breathing. During session, lights are off and soft music is being played. Pts are given a stress ball to use if needed.   Goal Area(s) Addressed:  Patients will be able to describe progressive muscle relaxation.  Patient will practice using relaxation technique. Patient will identify a new coping skill.  Patient will follow multistep directions to reduce anxiety and stress.   Affect/Mood: Appropriate   Participation Level: Active   Participation Quality: Independent   Behavior: Appropriate, Calm, and Cooperative   Speech/Thought Process: Coherent   Insight: Fair   Judgement: Fair    Modes of Intervention: Activity, Education, Exploration, and Guided Discussion   Patient Response to Interventions:  Receptive   Education Outcome:  Acknowledges education   Clinical Observations/Individualized Feedback: [place] was mostly active in their participation of session activities and group discussion. Pt completed most of the exercises as shown. Pt minimally interacted with LRT and peers while present in group.    Plan: Continue to engage patient in RT group sessions 2-3x/week.   Rosina Lowenstein, LRT, CTRS 03/20/2023 12:46 PM

## 2023-03-20 NOTE — Plan of Care (Signed)
D: Pt alert and oriented. Pt denies experiencing any anxiety/depression at this time. Pt denies experiencing any pain at this time. Pt denies experiencing any SI/HI, or AVH at this time however, at times can be heard responding to internal stimuli in room.   A: Scheduled medications administered to pt, per MD orders. Support and encouragement provided. Frequent verbal contact made. Routine safety checks conducted q15 minutes.   R: No adverse drug reactions noted. Pt verbally contracts for safety at this time. Pt compliant with medications and treatment plan. Pt interacts minimally with others on the unit. Pt remains safe at this time. Plan of care ongoing.  Problem: Coping: Goal: Level of anxiety will decrease Outcome: Progressing   Problem: Elimination: Goal: Will not experience complications related to bowel motility Outcome: Progressing

## 2023-03-20 NOTE — Progress Notes (Signed)
[facility] MD Progress Note  Betty Henderson  MRN:  [id number]  Subjective: Case discussed in multidisciplinary meeting today, chart reviewed, patient seen today during rounds.  Social worker informed that patient has been assigned a guardian via court, the final/official  court hearing is on Jan 14th, 2025  Chart reviewed, case discussed in multidisciplinary team today, patient seen during rounds. No new acute events overnight.  Patient is more visible in the area today.  Patient reports she is doing "Ok".  Patient denies thoughts of harming herself or others.  Patient denies auditory or visual hallucinations.  Patient is awaiting placement.  Principal Problem: Schizoaffective disorder, bipolar type (HCC) Diagnosis: Principal Problem:   Schizoaffective disorder, bipolar type (HCC)   Past Psychiatric History: Schizoaffective disorder, bipolar type.  Past Medical History:  Past Medical History:  Diagnosis Date   Anemia    Arthritis    Chronic pain    Drug-seeking behavior    Malingering    Osteopetrosis    Psychosis (HCC)    Schizoaffective disorder, bipolar type (HCC)     Past Surgical History:  Procedure Laterality Date   MOUTH SURGERY     TUBAL LIGATION     Family History:  Family History  Family history unknown: Yes   Family Psychiatric  History: Unremarkable Social History:  Social History   Substance and Sexual Activity  Alcohol Use Not Currently   Comment: 1 cocktail 3 weeks ago     Social History   Substance and Sexual Activity  Drug Use Not Currently   Types: Cocaine   Comment: states "it's legal though"    Social History   Socioeconomic History   Marital status: Single    Spouse name: Not on file   Number of children: Not on file   Years of education: Not on file   Highest education level: Not on file  Occupational History   Not on file  Tobacco Use   Smoking status: Every Day    Current packs/day: 0.50    Types: Cigarettes   Smokeless tobacco:  Never  Vaping Use   Vaping status: Never Used  Substance and Sexual Activity   Alcohol use: Not Currently    Comment: 1 cocktail 3 weeks ago   Drug use: Not Currently    Types: Cocaine    Comment: states "it's legal though"   Sexual activity: Never  Other Topics Concern   Not on file  Social History Narrative   Not on file   Social Drivers of Health   Financial Resource Strain: Not on file  Food Insecurity: Food Insecurity Present (08/22/2022)   Hunger Vital Sign    Worried About Running Out of Food in the Last Year: Sometimes true    Ran Out of Food in the Last Year: Sometimes true  Transportation Needs: Patient Unable To Answer (08/22/2022)   PRAPARE - Transportation    Lack of Transportation (Medical): Patient unable to answer    Lack of Transportation (Non-Medical): Patient unable to answer  Recent Concern: Transportation Needs - Unmet Transportation Needs (06/13/2022)   Received from [facility], [facility]   [facility] - Transportation    Lack of Transportation (Medical): Not on file    Lack of Transportation (Non-Medical): Yes  Physical Activity: Not on file  Stress: Not on file  Social Connections: Unknown (08/06/2021)   Received from [facility], [facility]   Social Network    Social Network: Not on file   Additional Social History:  Patient is homeless at this time.  Social worker is working with DSS on [employer] application and  placement.                    Sleep Good  Appetite:  Good  Current Medications: Current Facility-Administered Medications  Medication Dose Route Frequency Provider Last Rate Last Admin   acetaminophen (TYLENOL) tablet 650 mg  650 mg Oral Q6H PRN Betty Ill, DO   650 mg at 03/17/23 0957   alum & mag hydroxide-simeth (MAALOX/MYLANTA) 200-200-20 MG/5ML suspension 30 mL  30 mL Oral Q4H PRN Betty Ill, DO   30 mL at 02/28/23 0153   aspirin EC tablet 81 mg  81 mg Oral Daily [name], MD   81  mg at 03/20/23 1001   atorvastatin (LIPITOR) tablet 20 mg  20 mg Oral Daily [name], MD   20 mg at 03/20/23 1001   diphenhydrAMINE (BENADRYL) capsule 50 mg  50 mg Oral Q6H PRN Betty Ill, DO   50 mg at 09/29/22 2254   Or   diphenhydrAMINE (BENADRYL) injection 50 mg  50 mg Intramuscular Q6H PRN Betty Ill, DO       glimepiride (AMARYL) tablet 8 mg  8 mg Oral Q breakfast [name], MD   8 mg at 03/20/23 1002   haloperidol (HALDOL) tablet 5 mg  5 mg Oral Q6H PRN Betty Ill, DO   5 mg at 09/28/22 2149   Or   haloperidol lactate (HALDOL) injection 5 mg  5 mg Intramuscular Q6H PRN Betty Ill, DO       ibuprofen (ADVIL) tablet 600 mg  600 mg Oral Q6H PRN [name], [name], MD   600 mg at 03/05/23 5409   linagliptin (TRADJENTA) tablet 5 mg  5 mg Oral Daily [name], MD   5 mg at 03/20/23 1001   LORazepam (ATIVAN) tablet 1 mg  1 mg Oral TID PRN Betty Ill, DO   1 mg at 02/24/23 2139   magnesium hydroxide (MILK OF MAGNESIA) suspension 30 mL  30 mL Oral Daily PRN Betty Ill, DO   30 mL at 03/04/23 1149   metFORMIN (GLUCOPHAGE) tablet 1,000 mg  1,000 mg Oral BH-q8a4p Betty Ill, DO   1,000 mg at 03/20/23 1001   ondansetron (ZOFRAN) tablet 4 mg  4 mg Oral Q4H PRN [name], MD   4 mg at 02/27/23 2016   oxybutynin (DITROPAN) tablet 5 mg  5 mg Oral BID [name], MD   5 mg at 03/20/23 1001   paliperidone (INVEGA SUSTENNA) injection 156 mg  156 mg Intramuscular Q28 days [name], MD   156 mg at 03/12/23 1516   pantoprazole (PROTONIX) EC tablet 80 mg  80 mg Oral Daily Betty Ill, DO   80 mg at 03/20/23 1001   topiramate (TOPAMAX) tablet 50 mg  50 mg Oral BH-q8a4p Betty Ill, DO   50 mg at 03/20/23 1001   traZODone (DESYREL) tablet 50 mg  50 mg Oral QHS PRN Betty Ill, DO   50 mg at 03/19/23 2124   zonisamide (ZONEGRAN) capsule 100 mg  100 mg Oral  QHS Betty Ill, DO   100 mg at 03/19/23 2122    Lab Results:  No results found for this or any previous visit (from the past 48 hours).      Blood Alcohol level:  Lab Results  Component Value  Date   ETH <10 08/17/2022   ETH <10 01/11/2021      Musculoskeletal: Strength & Muscle Tone: within normal limits Gait & Station: normal Patient leans: N/A   Psychiatric Specialty Exam:   Presentation  General Appearance:  Casual; Neat   Eye Contact: Fair   Speech: Spontaneous   Speech Volume: Normal   Handedness: Right     Mood and Affect  Mood: "Fine"   Affect: Stable and pleasant    Thought Processes: Improved, at baseline   Descriptions of Associations: Intact   Orientation: Well-oriented   Thought Content: Improved, at baseline  Hallucinations:Denies  Ideas of Reference:None noted   Suicidal Thoughts:Denies SI, no intention or plan  Homicidal Thoughts:Denies HI, no intention or plan   Sensorium  Memory: Immediate Fair; Remote Poor   Judgment: Fair, pt is Rx compliant   Insight: Improved     Executive Functions  Concentration: Improved   Attention Span: Improved   Language: Fair     Psychomotor Activity  Psychomotor Activity: Normal  Assets  Assets: Communication Skills     Sleep  Sleep:Improved     Physical Exam: Physical Exam Vitals and nursing note reviewed.  Constitutional:      Appearance: Normal appearance. She is normal weight.  Neurological:     General: No focal deficit present.     Mental Status: She is alert.      Review of Systems  Constitutional: Negative.   HENT: Negative.    Eyes: Negative.   Respiratory: Negative.    Cardiovascular: Negative.   Gastrointestinal: Negative.   Genitourinary: Negative.   Musculoskeletal: Negative.   Skin: Negative.   Neurological: Negative.   Endo/Heme/Allergies: Negative.     Blood pressure (!) 88/68, pulse 84, temperature (!) 97.3 F (36.3  C), resp. rate 16, height 5\' 6"  (1.676 m), weight 86 kg, SpO2 97%. Body mass index is 30.59 kg/m.   Treatment Plan Summary: Daily contact with patient to assess and evaluate symptoms and progress in treatment, Medication management, and Plan continue current medications.   Continue to monitor patient on as needed meds   Patient received  dose of Invega Sustenna 156 mg IM on  03/12/23 Oxybutynin 5 mg by mouth twice daily to help with urinary incontinence Continue on Metformin and Amaryl.   Continue on atorvastatin for Mixed hyperlipidemia  Patient is awaiting placement    [name], MD

## 2023-03-20 NOTE — Progress Notes (Signed)
   03/19/23 1931  Psych Admission Type (Psych Patients Only)  Admission Status Voluntary  Psychosocial Assessment  Patient Complaints Anxiety  Eye Contact Fair  Facial Expression Flat  Affect Depressed  Speech Logical/coherent  Interaction Minimal  Motor Activity Slow  Appearance/Hygiene In scrubs  Behavior Characteristics Cooperative  Mood Anxious  Aggressive Behavior  Effect No apparent injury  Thought Process  Coherency WDL  Content WDL  Delusions None reported or observed  Perception WDL  Hallucination None reported or observed  Judgment WDL  Confusion WDL  Danger to Self  Current suicidal ideation? Denies  Agreement Not to Harm Self Yes  Description of Agreement verbal  Danger to Others  Danger to Others None reported or observed  Danger to Others Abnormal  Harmful Behavior to others No threats or harm toward other people

## 2023-03-20 NOTE — Group Note (Signed)
Date:  03/19/2023 Time:  09:00 PM  Group Topic/Focus:  Conflict Resolution:   The focus of this group is to discuss the conflict resolution process and how it may be used upon discharge.    Participation Level:  Active  Participation Quality:  Appropriate  Affect:  Appropriate  Cognitive:  Appropriate  Insight: Appropriate  Engagement in Group:  Engaged  Modes of Intervention:  Education  Additional Comments:    [name] 03/19/2023, 09:00 PM

## 2023-03-20 NOTE — Plan of Care (Signed)
  Problem: Education: Goal: Knowledge of General Education information will improve Description: Including pain rating scale, medication(s)/side effects and non-pharmacologic comfort measures Outcome: Progressing   Problem: Health Behavior/Discharge Planning: Goal: Ability to manage health-related needs will improve Outcome: Progressing   Problem: Clinical Measurements: Goal: Ability to maintain clinical measurements within normal limits will improve Outcome: Progressing Goal: Diagnostic test results will improve Outcome: Progressing   Problem: Nutrition: Goal: Adequate nutrition will be maintained Outcome: Progressing   Problem: Coping: Goal: Level of anxiety will decrease Outcome: Progressing   Problem: Elimination: Goal: Will not experience complications related to bowel motility Outcome: Progressing Goal: Will not experience complications related to urinary retention Outcome: Progressing   Problem: Pain Managment: Goal: General experience of comfort will improve Outcome: Progressing   Problem: Safety: Goal: Ability to remain free from injury will improve Outcome: Progressing   Problem: Skin Integrity: Goal: Risk for impaired skin integrity will decrease Outcome: Progressing

## 2023-03-20 NOTE — Group Note (Signed)
Recreation Therapy Group Note   Group Topic:Self-Esteem  Group Date: 03/20/2023 Start Time: 1500 End Time: 1545 Facilitators: Rosina Lowenstein, LRT, CTRS Location:  Dayroom  Group Description: My strengths and Qualities. Patients and LRT discussed the importance of self-love/self-esteem and things that cause it to fluctuate, including our mental health or state. Pt completed a worksheet that helps them identify 24 different strengths and qualities about themselves. Pt encouraged to read aloud at least 3 off their sheet to the group. LRT and pts discussed how this can be applied to daily life post-discharge.    Goal Area(s) Addressed: Patient will identify positive qualities about themselves. Patient will learn new positive affirmations.  Patient will recite positive qualities and affirmations aloud to the group.  Patient will practice positive self-talk.  Patient will increase communication.    Affect/Mood: Appropriate and Flat   Participation Level: Active and Engaged   Participation Quality: Independent   Behavior: Calm and Cooperative   Speech/Thought Process: Coherent   Insight: Fair   Judgement: Fair    Modes of Intervention: Education, Exploration, Guided Discussion, and Worksheet   Patient Response to Interventions:  Attentive and Receptive   Education Outcome:  In group clarification offered    Clinical Observations/Individualized Feedback: Betty Henderson was active in their participation of session activities and group discussion. Pt identified "What I like most about my appearance is my hair, dress, and lipstick. Time I have made others happy are on the weekend, on Friday and at home. Things I value the most are my car, my money and my children". Pt interacted well with LRT and peers duration of session.    Plan: Continue to engage patient in RT group sessions 2-3x/week.   Rosina Lowenstein, LRT, CTRS 03/20/2023 5:14 PM

## 2023-03-20 NOTE — Group Note (Signed)
Date:  03/20/2023 Time:  10:40 PM  Group Topic/Focus:  Coping With Mental Health Crisis:   The purpose of this group is to help patients identify strategies for coping with mental health crisis.  Group discusses possible causes of crisis and ways to manage them effectively.    Participation Level:  Did Not Attend  Participation Quality:   Did Not Attend  Affect:   Did Not Attend  Cognitive:   Did Not Attend  Insight: None  Engagement in Group:  None  Modes of Intervention:  Education  Additional Comments:    [name] 03/20/2023, 10:40 PM

## 2023-03-21 DIAGNOSIS — F250 Schizoaffective disorder, bipolar type: Secondary | ICD-10-CM | POA: Diagnosis not present

## 2023-03-21 LAB — BASIC METABOLIC PANEL
Anion gap: 10 (ref 5–15)
BUN: 14 mg/dL (ref 8–23)
CO2: 21 mmol/L — ABNORMAL LOW (ref 22–32)
Calcium: 9.6 mg/dL (ref 8.9–10.3)
Chloride: 107 mmol/L (ref 98–111)
Creatinine, Ser: 0.93 mg/dL (ref 0.44–1.00)
GFR, Estimated: >60 mL/min (ref >60)
Glucose, Bld: 158 mg/dL — ABNORMAL HIGH (ref 70–99)
Potassium: 3.7 mmol/L (ref 3.5–5.1)
Sodium: 138 mmol/L (ref 135–145)

## 2023-03-21 NOTE — BHH Counselor (Signed)
CSW contacted director of [employer] via email to see if she has any female bed availabilities.   Reynaldo Minium, MSW, [place] 03/21/2023 10:37 AM

## 2023-03-21 NOTE — Plan of Care (Signed)
  Problem: Education: Goal: Knowledge of General Education information will improve Description: Including pain rating scale, medication(s)/side effects and non-pharmacologic comfort measures Outcome: Progressing   Problem: Coping: Goal: Level of anxiety will decrease Outcome: Progressing   

## 2023-03-21 NOTE — Group Note (Signed)
Recreation Therapy Group Note   Group Topic:General Recreation  Group Date: 03/21/2023 Start Time: 1100 End Time: 1150 Facilitators: Rosina Lowenstein, LRT, CTRS Location:  Dayroom  Group Description: General Recreation. Patients were given the opportunity to play cards, journal, or sing karaoke during group. Pt identified and conversated about things they enjoy doing in their free time and how they can continue to do that outside of the hospital.  Goal Area(s) Addressed: Patient will practice making a positive decision. Patient will have the opportunity to try a new leisure activity. Patient will communicate with peers and LRT.   Affect/Mood: Appropriate   Participation Level: Active and Engaged   Participation Quality: Independent   Behavior: Alert, Appropriate, and Cooperative   Speech/Thought Process: Coherent   Insight: Good   Judgement: Good   Modes of Intervention: Activity, Education, Exploration, Guided Discussion, and Music   Patient Response to Interventions:  Attentive, Engaged, and Receptive   Education Outcome:  Acknowledges education   Clinical Observations/Individualized Feedback: Diedre was active in their participation of session activities and group discussion. Pt sang songs by Donalee Citrin while present in group. Pt interacted well with LRT and peers duration of session.   Plan: Continue to engage patient in RT group sessions 2-3x/week.   Rosina Lowenstein, LRT, CTRS 03/21/2023 1:33 PM

## 2023-03-21 NOTE — Progress Notes (Signed)
   03/20/23 1956  Psych Admission Type (Psych Patients Only)  Admission Status Voluntary  Psychosocial Assessment  Patient Complaints Anxiety  Eye Contact Fair  Facial Expression Flat  Affect Depressed  Speech Logical/coherent  Interaction Minimal  Motor Activity Slow  Appearance/Hygiene In scrubs  Behavior Characteristics Cooperative  Mood Anxious  Aggressive Behavior  Effect No apparent injury  Thought Process  Coherency WDL  Content WDL  Delusions None reported or observed  Perception WDL  Hallucination None reported or observed  Judgment WDL  Confusion WDL  Danger to Self  Current suicidal ideation? Denies  Agreement Not to Harm Self Yes  Description of Agreement verbal  Danger to Others  Danger to Others None reported or observed  Danger to Others Abnormal  Harmful Behavior to others No threats or harm toward other people

## 2023-03-21 NOTE — Plan of Care (Signed)
D: Pt alert and oriented. Pt denies experiencing any anxiety/depression at this time. Pt denies experiencing any pain at this time. Pt denies experiencing any SI/HI, or AVH at this time.   A: Scheduled medications administered to pt, per MD orders. Support and encouragement provided. Frequent verbal contact made. Routine safety checks conducted q15 minutes.   R: No adverse drug reactions noted. Pt verbally contracts for safety at this time. Pt compliant with medications and treatment plan. Pt interacts well but minimally with others on the unit. Pt remains safe at this time. Plan of care ongoing.  Problem: Nutrition: Goal: Adequate nutrition will be maintained Outcome: Progressing   Problem: Pain Managment: Goal: General experience of comfort will improve Outcome: Progressing

## 2023-03-21 NOTE — Progress Notes (Signed)
[facility] MD Progress Note  Betty Henderson  MRN:  [id number]  Subjective: Case discussed in multidisciplinary meeting today, chart reviewed, patient seen today during rounds.  Social worker informed that patient has been assigned a guardian via court, the final/official  court hearing is on Jan 14th, 2025  Chart reviewed, case discussed in multidisciplinary team today, patient seen during rounds. No new acute events overnight.  Patient is more visible in the dayarea today.  She was observed sitting in Am group today. Patient reports she is doing "Ok".  Patient denies thoughts of harming herself or others.  Patient denies auditory or visual hallucinations.  Patient is awaiting placement.  Principal Problem: Schizoaffective disorder, bipolar type (HCC) Diagnosis: Principal Problem:   Schizoaffective disorder, bipolar type (HCC)   Past Psychiatric History: Schizoaffective disorder, bipolar type.  Past Medical History:  Past Medical History:  Diagnosis Date   Anemia    Arthritis    Chronic pain    Drug-seeking behavior    Malingering    Osteopetrosis    Psychosis (HCC)    Schizoaffective disorder, bipolar type (HCC)     Past Surgical History:  Procedure Laterality Date   MOUTH SURGERY     TUBAL LIGATION     Family History:  Family History  Family history unknown: Yes   Family Psychiatric  History: Unremarkable Social History:  Social History   Substance and Sexual Activity  Alcohol Use Not Currently   Comment: 1 cocktail 3 weeks ago     Social History   Substance and Sexual Activity  Drug Use Not Currently   Types: Cocaine   Comment: states "it's legal though"    Social History   Socioeconomic History   Marital status: Single    Spouse name: Not on file   Number of children: Not on file   Years of education: Not on file   Highest education level: Not on file  Occupational History   Not on file  Tobacco Use   Smoking status: Every Day    Current packs/day:  0.50    Types: Cigarettes   Smokeless tobacco: Never  Vaping Use   Vaping status: Never Used  Substance and Sexual Activity   Alcohol use: Not Currently    Comment: 1 cocktail 3 weeks ago   Drug use: Not Currently    Types: Cocaine    Comment: states "it's legal though"   Sexual activity: Never  Other Topics Concern   Not on file  Social History Narrative   Not on file   Social Drivers of Health   Financial Resource Strain: Not on file  Food Insecurity: Food Insecurity Present (08/22/2022)   Hunger Vital Sign    Worried About Running Out of Food in the Last Year: Sometimes true    Ran Out of Food in the Last Year: Sometimes true  Transportation Needs: Patient Unable To Answer (08/22/2022)   PRAPARE - Transportation    Lack of Transportation (Medical): Patient unable to answer    Lack of Transportation (Non-Medical): Patient unable to answer  Recent Concern: Transportation Needs - Unmet Transportation Needs (06/13/2022)   Received from [facility], [facility]   [facility] - Transportation    Lack of Transportation (Medical): Not on file    Lack of Transportation (Non-Medical): Yes  Physical Activity: Not on file  Stress: Not on file  Social Connections: Unknown (08/06/2021)   Received from [facility], [facility]   Social Network    Social Network: Not on file  Additional Social History:     Patient is homeless at this time.  Social worker is working with DSS on [employer] application and  placement.                    Sleep Good  Appetite:  Good  Current Medications: Current Facility-Administered Medications  Medication Dose Route Frequency Provider Last Rate Last Admin   acetaminophen (TYLENOL) tablet 650 mg  650 mg Oral Q6H PRN [name], DO   650 mg at 03/17/23 0957   alum & mag hydroxide-simeth (MAALOX/MYLANTA) 200-200-20 MG/5ML suspension 30 mL  30 mL Oral Q4H PRN [name], DO   30 mL at 02/28/23 0153   aspirin EC  tablet 81 mg  81 mg Oral Daily [name], MD   81 mg at 03/21/23 8295   atorvastatin (LIPITOR) tablet 20 mg  20 mg Oral Daily [name], MD   20 mg at 03/21/23 6213   diphenhydrAMINE (BENADRYL) capsule 50 mg  50 mg Oral Q6H PRN [name], DO   50 mg at 09/29/22 2254   Or   diphenhydrAMINE (BENADRYL) injection 50 mg  50 mg Intramuscular Q6H PRN [name], DO       glimepiride (AMARYL) tablet 8 mg  8 mg Oral Q breakfast [name], MD   8 mg at 03/21/23 0865   haloperidol (HALDOL) tablet 5 mg  5 mg Oral Q6H PRN [name], DO   5 mg at 09/28/22 2149   Or   haloperidol lactate (HALDOL) injection 5 mg  5 mg Intramuscular Q6H PRN [name], DO       ibuprofen (ADVIL) tablet 600 mg  600 mg Oral Q6H PRN [name], [name], MD   600 mg at 03/05/23 7846   linagliptin (TRADJENTA) tablet 5 mg  5 mg Oral Daily [name], MD   5 mg at 03/21/23 9629   LORazepam (ATIVAN) tablet 1 mg  1 mg Oral TID PRN [name], DO   1 mg at 02/24/23 2139   magnesium hydroxide (MILK OF MAGNESIA) suspension 30 mL  30 mL Oral Daily PRN [name], DO   30 mL at 03/04/23 1149   metFORMIN (GLUCOPHAGE) tablet 1,000 mg  1,000 mg Oral BH-q8a4p [name], DO   1,000 mg at 03/21/23 0937   ondansetron (ZOFRAN) tablet 4 mg  4 mg Oral Q4H PRN [name], MD   4 mg at 02/27/23 2016   oxybutynin (DITROPAN) tablet 5 mg  5 mg Oral BID [name], MD   5 mg at 03/21/23 5284   paliperidone (INVEGA SUSTENNA) injection 156 mg  156 mg Intramuscular Q28 days [name], MD   156 mg at 03/12/23 1516   pantoprazole (PROTONIX) EC tablet 80 mg  80 mg Oral Daily [name], DO   80 mg at 03/21/23 1324   topiramate (TOPAMAX) tablet 50 mg  50 mg Oral BH-q8a4p [name], DO   50 mg at 03/21/23 4010   traZODone (DESYREL) tablet 50 mg  50 mg Oral QHS PRN [name], DO   50 mg at 03/20/23 2120    zonisamide (ZONEGRAN) capsule 100 mg  100 mg Oral QHS [name], DO   100 mg at 03/20/23 2120    Lab Results:  No results found for this or any previous visit (from the past 48 hours).      Blood Alcohol  level:  Lab Results  Component Value Date   ETH <10 08/17/2022   ETH <10 01/11/2021      Musculoskeletal: Strength & Muscle Tone: within normal limits Gait & Station: normal Patient leans: N/A   Psychiatric Specialty Exam:   Presentation  General Appearance:  Casual; Neat   Eye Contact: Fair   Speech: Spontaneous   Speech Volume: Normal   Handedness: Right     Mood and Affect  Mood: "Fine"   Affect: Stable and pleasant    Thought Processes: Improved, at baseline   Descriptions of Associations: Intact   Orientation: Well-oriented   Thought Content: Improved, at baseline  Hallucinations:Denies  Ideas of Reference:None noted   Suicidal Thoughts:Denies SI, no intention or plan  Homicidal Thoughts:Denies HI, no intention or plan   Sensorium  Memory: Immediate Fair; Remote Poor   Judgment: Fair, pt is Rx compliant   Insight: Improved     Executive Functions  Concentration: Improved   Attention Span: Improved   Language: Fair     Psychomotor Activity  Psychomotor Activity: Normal  Assets  Assets: Communication Skills     Sleep  Sleep:Improved     Physical Exam: Physical Exam Vitals and nursing note reviewed.  Constitutional:      Appearance: Normal appearance. She is normal weight.  Neurological:     General: No focal deficit present.     Mental Status: She is alert.      Review of Systems  Constitutional: Negative.   HENT: Negative.    Eyes: Negative.   Respiratory: Negative.    Cardiovascular: Negative.   Gastrointestinal: Negative.   Genitourinary: Negative.   Musculoskeletal: Negative.   Skin: Negative.   Neurological: Negative.   Endo/Heme/Allergies: Negative.     Blood  pressure (!) 99/44, pulse 68, temperature (!) 97.4 F (36.3 C), resp. rate 16, height 5\' 6"  (1.676 m), weight 86 kg, SpO2 100%. Body mass index is 30.59 kg/m.   Treatment Plan Summary: Daily contact with patient to assess and evaluate symptoms and progress in treatment, Medication management, and Plan continue current medications.   Continue to monitor patient on as needed meds   Patient received  dose of Invega Sustenna 156 mg IM on  03/12/23 Oxybutynin 5 mg by mouth twice daily to help with urinary incontinence Continue on Metformin and Amaryl.   Continue on atorvastatin for Mixed hyperlipidemia  Patient is awaiting placement    [name], MD

## 2023-03-21 NOTE — Group Note (Signed)
Recreation Therapy Group Note   Group Topic:Emotion Expression  Group Date: 03/21/2023 Start Time: 1500 End Time: 1600 Facilitators: Rosina Lowenstein, LRT, CTRS Location:  Dayroom  Group Description: Positivity Collage. LRT and patients discussed the importance of having a positive mindset and being happy. Patients received magazines, safety scissors, a glue stick and a piece of paper. Pts were encouraged to find images or words in the magazines that showed "happiness" or positivity to them. Pt shared their collage with the group once they were finished. LRT and pts discussed how it can be difficult to always have a positive mindset, especially when they have mental health challenges.   Goal Area(s) Addressed:  Pt will identify things associate with positivity. Pt will reduce negative thinking. Pt will identify a new coping skill of thinking positive thoughts.    Affect/Mood: Appropriate   Participation Level: Active and Engaged   Participation Quality: Independent   Behavior: Alert, Calm, and Cooperative   Speech/Thought Process: Loose association   Insight: Fair   Judgement: Fair    Modes of Intervention: Art, Education, Exploration, and Guided Discussion   Patient Response to Interventions:  Attentive, Engaged, and Receptive   Education Outcome:  Acknowledges education   Clinical Observations/Individualized Feedback: Kynzleigh was active in their participation of session activities and group discussion. Pt identified "frosted flakes, baby, and food" as positive things that make her happy. Pt was noted to be talking in the direction of someone who was not there. Overall, pt interacted well with LRT and peers duration of session.    Plan: Continue to engage patient in RT group sessions 2-3x/week.   Rosina Lowenstein, LRT, CTRS 03/21/2023 5:00 PM

## 2023-03-21 NOTE — Group Note (Signed)
Date:  03/21/2023 Time:  12:32 PM  Group Topic/Focus:  Goals Group:   The focus of this group is to help patients establish daily goals to achieve during treatment and discuss how the patient can incorporate goal setting into their daily lives to aide in recovery. Healthy Communication:   The focus of this group is to discuss communication, barriers to communication, as well as healthy ways to communicate with others.    Participation Level:  Active  Participation Quality:  Appropriate  Affect:  Appropriate  Cognitive:  Alert and Appropriate  Insight: Appropriate  Engagement in Group:  Developing/Improving  Modes of Intervention:  Activity and Discussion  Additional Comments:    Cheril Slattery 03/21/2023, 12:32 PM

## 2023-03-22 DIAGNOSIS — F250 Schizoaffective disorder, bipolar type: Secondary | ICD-10-CM | POA: Diagnosis not present

## 2023-03-22 NOTE — Progress Notes (Signed)
   03/22/23 0618  15 Minute Checks  Location Bedroom  Visual Appearance Calm  Behavior Sleeping  Sleep (Behavioral Health Patients Only)  Calculate sleep? (Click Yes once per 24 hr at 0600 safety check) Yes  Documented sleep last 24 hours 9.75

## 2023-03-22 NOTE — Progress Notes (Signed)
Patient observed lying in bed, resting at shift change. Patient guarded and isolative, participated in shift assessment appropriately. Patient denies SI/HI/AVH/pain and anxiety. Patient med compliant and received PRN Trazodone 50 mg PO for insomnia. Patient ambulated to day room, ate a snack and returned to her room without incident. No acute distress noted, patient hypotensive, asymptomatic. Patient routine obs to continue Q 15 mins to monitor safety.

## 2023-03-22 NOTE — Plan of Care (Signed)
D: Pt alert and oriented. Pt denies experiencing any anxiety/depression at this time however, displays depressive behaviors. Pt denies experiencing any pain at this time. Pt denies experiencing any SI/HI, or AVH at this time.   A: Scheduled medications administered to pt, per MD orders. Support and encouragement provided. Frequent verbal contact made. Routine safety checks conducted q15 minutes.   R: No adverse drug reactions noted. Pt verbally contracts for safety at this time. Pt compliant with medications and treatment plan. Pt interacts well but minimally with others on the unit. Pt remains safe at this time. Plan of care ongoing.  Pt attended groups today.  Problem: Nutrition: Goal: Adequate nutrition will be maintained Outcome: Progressing   Problem: Coping: Goal: Level of anxiety will decrease Outcome: Progressing

## 2023-03-22 NOTE — Plan of Care (Signed)
  Problem: Education: Goal: Knowledge of General Education information will improve Description: Including pain rating scale, medication(s)/side effects and non-pharmacologic comfort measures Outcome: Progressing   Problem: Health Behavior/Discharge Planning: Goal: Ability to manage health-related needs will improve Outcome: Progressing   Problem: Clinical Measurements: Goal: Ability to maintain clinical measurements within normal limits will improve Outcome: Progressing Goal: Diagnostic test results will improve Outcome: Progressing   Problem: Nutrition: Goal: Adequate nutrition will be maintained Outcome: Progressing   Problem: Coping: Goal: Level of anxiety will decrease Outcome: Progressing   Problem: Elimination: Goal: Will not experience complications related to bowel motility Outcome: Progressing Goal: Will not experience complications related to urinary retention Outcome: Progressing   Problem: Pain Managment: Goal: General experience of comfort will improve Outcome: Progressing   Problem: Safety: Goal: Ability to remain free from injury will improve Outcome: Progressing   Problem: Skin Integrity: Goal: Risk for impaired skin integrity will decrease Outcome: Progressing

## 2023-03-22 NOTE — Group Note (Signed)
Date:  03/22/2023 Time:  8:45 PM  Group Topic/Focus:  Personal Choices and Values:   The focus of this group is to help patients assess and explore the importance of values in their lives, how their values affect their decisions, how they express their values and what opposes their expression.    Participation Level:  Active  Participation Quality:  Appropriate  Affect:  Appropriate  Cognitive:  Appropriate  Insight: Good  Engagement in Group:  Engaged  Modes of Intervention:  Discussion  Additional Comments:    [name] 03/22/2023, 8:45 PM

## 2023-03-22 NOTE — Progress Notes (Signed)
[facility] MD Progress Note  Betty Henderson  MRN:  [id number]  Subjective: Case discussed in multidisciplinary meeting today, chart reviewed, patient seen today during rounds.  Social worker informed that patient has been assigned a guardian via court, the final/official  court hearing is on Jan 14th, 2025  Chart reviewed, case discussed in multidisciplinary team today, patient seen during rounds. No new acute events overnight.  Patient is more visible in the dayarea today.  Patient reports she is doing "Ok".  Patient reports she has talked to her son via phone couple of times this week.  Patient denies thoughts of harming herself or others.  Patient denies auditory or visual hallucinations.  Patient is awaiting placement.  Principal Problem: Schizoaffective disorder, bipolar type (HCC) Diagnosis: Principal Problem:   Schizoaffective disorder, bipolar type (HCC)   Past Psychiatric History: Schizoaffective disorder, bipolar type.  Past Medical History:  Past Medical History:  Diagnosis Date   Anemia    Arthritis    Chronic pain    Drug-seeking behavior    Malingering    Osteopetrosis    Psychosis (HCC)    Schizoaffective disorder, bipolar type (HCC)     Past Surgical History:  Procedure Laterality Date   MOUTH SURGERY     TUBAL LIGATION     Family History:  Family History  Family history unknown: Yes   Family Psychiatric  History: Unremarkable Social History:  Social History   Substance and Sexual Activity  Alcohol Use Not Currently   Comment: 1 cocktail 3 weeks ago     Social History   Substance and Sexual Activity  Drug Use Not Currently   Types: Cocaine   Comment: states "it's legal though"    Social History   Socioeconomic History   Marital status: Single    Spouse name: Not on file   Number of children: Not on file   Years of education: Not on file   Highest education level: Not on file  Occupational History   Not on file  Tobacco Use   Smoking status:  Every Day    Current packs/day: 0.50    Types: Cigarettes   Smokeless tobacco: Never  Vaping Use   Vaping status: Never Used  Substance and Sexual Activity   Alcohol use: Not Currently    Comment: 1 cocktail 3 weeks ago   Drug use: Not Currently    Types: Cocaine    Comment: states "it's legal though"   Sexual activity: Never  Other Topics Concern   Not on file  Social History Narrative   Not on file   Social Drivers of Health   Financial Resource Strain: Not on file  Food Insecurity: Food Insecurity Present (08/22/2022)   Hunger Vital Sign    Worried About Running Out of Food in the Last Year: Sometimes true    Ran Out of Food in the Last Year: Sometimes true  Transportation Needs: Patient Unable To Answer (08/22/2022)   PRAPARE - Transportation    Lack of Transportation (Medical): Patient unable to answer    Lack of Transportation (Non-Medical): Patient unable to answer  Recent Concern: Transportation Needs - Unmet Transportation Needs (06/13/2022)   [facility], [facility]   [facility] - Transportation    Lack of Transportation (Medical): Not on file    Lack of Transportation (Non-Medical): Yes  Physical Activity: Not on file  Stress: Not on file  Social Connections: Unknown (08/06/2021)   Received from [facility], [facility]   Social Network  Social Network: Not on file   Additional Social History:     Patient is homeless at this time.  Social worker is working with DSS on [employer] application and  placement.                    Sleep Good  Appetite:  Good  Current Medications: Current Facility-Administered Medications  Medication Dose Route Frequency Provider Last Rate Last Admin   acetaminophen (TYLENOL) tablet 650 mg  650 mg Oral Q6H PRN [name], DO   650 mg at 03/17/23 0957   alum & mag hydroxide-simeth (MAALOX/MYLANTA) 200-200-20 MG/5ML suspension 30 mL  30 mL Oral Q4H PRN [name], DO   30 mL at  02/28/23 0153   aspirin EC tablet 81 mg  81 mg Oral Daily [name], MD   81 mg at 03/22/23 1884   atorvastatin (LIPITOR) tablet 20 mg  20 mg Oral Daily [name], MD   20 mg at 03/22/23 1660   diphenhydrAMINE (BENADRYL) capsule 50 mg  50 mg Oral Q6H PRN [name], DO   50 mg at 09/29/22 2254   Or   diphenhydrAMINE (BENADRYL) injection 50 mg  50 mg Intramuscular Q6H PRN [name], DO       glimepiride (AMARYL) tablet 8 mg  8 mg Oral Q breakfast [name], MD   8 mg at 03/22/23 6301   haloperidol (HALDOL) tablet 5 mg  5 mg Oral Q6H PRN [name], DO   5 mg at 09/28/22 2149   Or   haloperidol lactate (HALDOL) injection 5 mg  5 mg Intramuscular Q6H PRN [name], DO       ibuprofen (ADVIL) tablet 600 mg  600 mg Oral Q6H PRN [name], [name], MD   600 mg at 03/05/23 6010   linagliptin (TRADJENTA) tablet 5 mg  5 mg Oral Daily [name], MD   5 mg at 03/22/23 9323   LORazepam (ATIVAN) tablet 1 mg  1 mg Oral TID PRN [name], DO   1 mg at 02/24/23 2139   magnesium hydroxide (MILK OF MAGNESIA) suspension 30 mL  30 mL Oral Daily PRN [name], DO   30 mL at 03/04/23 1149   metFORMIN (GLUCOPHAGE) tablet 1,000 mg  1,000 mg Oral BH-q8a4p [name], DO   1,000 mg at 03/22/23 0918   ondansetron (ZOFRAN) tablet 4 mg  4 mg Oral Q4H PRN [name], MD   4 mg at 02/27/23 2016   oxybutynin (DITROPAN) tablet 5 mg  5 mg Oral BID [name], MD   5 mg at 03/22/23 0918   paliperidone (INVEGA SUSTENNA) injection 156 mg  156 mg Intramuscular Q28 days [name], MD   156 mg at 03/12/23 1516   pantoprazole (PROTONIX) EC tablet 80 mg  80 mg Oral Daily [name], DO   80 mg at 03/22/23 0917   topiramate (TOPAMAX) tablet 50 mg  50 mg Oral BH-q8a4p [name], DO   50 mg at 03/22/23 5573   traZODone (DESYREL) tablet 50 mg  50 mg Oral QHS PRN [name],  DO   50 mg at 03/21/23 2155   zonisamide (ZONEGRAN) capsule 100 mg  100 mg Oral QHS [name], DO   100 mg at 03/21/23 2155    Lab Results:  Results for orders placed or performed during the hospital encounter of 08/22/22 (from the  past 48 hours)  Basic metabolic panel     Status: Abnormal   Collection Time: 03/21/23  3:24 PM  Result Value Ref Range   Sodium 138 135 - 145 mmol/L   Potassium 3.7 3.5 - 5.1 mmol/L   Chloride 107 98 - 111 mmol/L   CO2 21 (L) 22 - 32 mmol/L   Glucose, Bld 158 (H) 70 - 99 mg/dL    Comment: Glucose reference range applies only to samples taken after fasting for at least 8 hours.   BUN 14 8 - 23 mg/dL   Creatinine, Ser 0.10 0.44 - 1.00 mg/dL   Calcium 9.6 8.9 - 27.2 mg/dL   GFR, Estimated >53 >66 mL/min    Comment: (NOTE) Calculated using the CKD-EPI Creatinine Equation (2021)    Anion gap 10 5 - 15    Comment: Performed at [facility], [address] [place], [place] [address]        Blood Alcohol level:  Lab Results  Component Value Date   [facility] <10 08/17/2022   ETH <10 01/11/2021      Musculoskeletal: Strength & Muscle Tone: within normal limits Gait & Station: normal Patient leans: N/A   Psychiatric Specialty Exam:   Presentation  General Appearance:  Casual; Neat   Eye Contact: Fair   Speech: Spontaneous   Speech Volume: Normal   Handedness: Right     Mood and Affect  Mood: "Ok"   Affect: Stable and pleasant    Thought Processes: Improved, at baseline   Descriptions of Associations: Intact   Orientation: Well-oriented   Thought Content: Improved, at baseline  Hallucinations:Denies  Ideas of Reference:None noted   Suicidal Thoughts:Denies SI, no intention or plan  Homicidal Thoughts:Denies HI, no intention or plan   Sensorium  Memory: Immediate Fair; Remote Poor   Judgment: Fair, pt is Rx compliant   Insight: Improved     Executive Functions   Concentration: Improved   Attention Span: Improved   Language: Fair     Psychomotor Activity  Psychomotor Activity: Normal  Assets  Assets: Communication Skills     Sleep  Sleep:Improved     Physical Exam: Physical Exam Vitals and nursing note reviewed.  Constitutional:      Appearance: Normal appearance. She is normal weight.  Neurological:     General: No focal deficit present.     Mental Status: She is alert.      Review of Systems  Constitutional: Negative.   HENT: Negative.    Eyes: Negative.   Respiratory: Negative.    Cardiovascular: Negative.   Gastrointestinal: Negative.   Genitourinary: Negative.   Musculoskeletal: Negative.   Skin: Negative.   Neurological: Negative.   Endo/Heme/Allergies: Negative.     Blood pressure (!) 95/58, pulse 69, temperature (!) 97.4 F (36.3 C), resp. rate 18, height 5\' 6"  (1.676 m), weight 86 kg, SpO2 98%. Body mass index is 30.59 kg/m.   Treatment Plan Summary: Daily contact with patient to assess and evaluate symptoms and progress in treatment, Medication management, and Plan continue current medications.   Continue to monitor patient on as needed meds   Patient received  dose of Invega Sustenna 156 mg IM on  03/12/23 Oxybutynin 5 mg by mouth twice daily to help with urinary incontinence Continue on Metformin and Amaryl.   Continue on atorvastatin for Mixed hyperlipidemia  Patient is awaiting placement    [name], MD

## 2023-03-22 NOTE — Group Note (Signed)
LCSW Group Therapy Note  Group Date: 03/22/2023 Start Time: 1300 End Time: 1340   Type of Therapy and Topic:  Group Therapy - Healthy vs Unhealthy Coping Skills  Participation Level: Patient Engagement was Minimal   Description of Group The focus of this group was to determine what unhealthy coping techniques typically are used by group members and what healthy coping techniques would be helpful in coping with various problems. Patients were guided in becoming aware of the differences between healthy and unhealthy coping techniques. Patients were asked to identify 2-3 healthy coping skills they would like to learn to use more effectively.  Therapeutic Goals Patients learned that coping is what human beings do all day long to deal with various situations in their lives Patients defined and discussed healthy vs unhealthy coping techniques Patients identified their preferred coping techniques and identified whether these were healthy or unhealthy Patients determined 2-3 healthy coping skills they would like to become more familiar with and use more often. Patients provided support and ideas to each other   Summary of Patient Progress:  During group, Betty Henderson expressed using music as a distraction and coping skill. Patient proved open to input from peers and feedback from CSW. Patient demonstrated poor insight into the subject matter, and struggled to remain engaged throughout the entire session.   Therapeutic Modalities Cognitive Behavioral Therapy Motivational Interviewing  Azucena Kuba, LCSWA 03/22/2023  2:06 PM

## 2023-03-22 NOTE — Group Note (Signed)
Date:  03/22/2023 Time:  1:22 AM  Group Topic/Focus:  Wrap-Up Group:   The focus of this group is to help patients review their daily goal of treatment and discuss progress on daily workbooks.    Participation Level:  Did Not Attend  Participation Quality:      Affect:      Cognitive:      Insight: None  Engagement in Group:  None  Modes of Intervention:      Additional Comments:    [name] 03/22/2023, 1:22 AM

## 2023-03-22 NOTE — Progress Notes (Signed)
   03/22/23 0200  Psych Admission Type (Psych Patients Only)  Admission Status Voluntary  Psychosocial Assessment  Patient Complaints None  Eye Contact Fair  Facial Expression Flat  Affect Sad  Speech Logical/coherent  Interaction Minimal  Motor Activity Slow  Appearance/Hygiene In scrubs  Behavior Characteristics Cooperative;Appropriate to situation  Mood Depressed;Sad  Aggressive Behavior  Effect No apparent injury  Thought Process  Coherency WDL  Content WDL  Delusions None reported or observed  Perception WDL  Hallucination None reported or observed  Judgment Limited  Confusion WDL  Danger to Self  Current suicidal ideation? Denies  Agreement Not to Harm Self Yes  Description of Agreement Verbal  Danger to Others  Danger to Others None reported or observed  Danger to Others Abnormal  Harmful Behavior to others No threats or harm toward other people

## 2023-03-22 NOTE — Group Note (Signed)
Date:  03/22/2023 Time:  4:25 PM  Group Topic/Focus:  Healthy Communication:   The focus of this group is to discuss communication, barriers to communication, as well as healthy ways to communicate with others. Self Care:   The focus of this group is to help patients understand the importance of self-care in order to improve or restore emotional, physical, spiritual, interpersonal, and financial health.    Participation Level:  Active  Participation Quality:  Appropriate  Affect:  Appropriate  Cognitive:  Appropriate  Insight: Appropriate  Engagement in Group:  Engaged  Modes of Intervention:  Activity  Additional Comments:    Glendale Wherry 03/22/2023, 4:25 PM

## 2023-03-23 DIAGNOSIS — F250 Schizoaffective disorder, bipolar type: Secondary | ICD-10-CM | POA: Diagnosis not present

## 2023-03-23 NOTE — Plan of Care (Signed)
  Problem: Education: Goal: Knowledge of General Education information will improve Description: Including pain rating scale, medication(s)/side effects and non-pharmacologic comfort measures Outcome: Progressing   Problem: Health Behavior/Discharge Planning: Goal: Ability to manage health-related needs will improve Outcome: Progressing   Problem: Clinical Measurements: Goal: Ability to maintain clinical measurements within normal limits will improve Outcome: Progressing Goal: Diagnostic test results will improve Outcome: Progressing   Problem: Nutrition: Goal: Adequate nutrition will be maintained Outcome: Progressing   Problem: Coping: Goal: Level of anxiety will decrease Outcome: Progressing   Problem: Elimination: Goal: Will not experience complications related to bowel motility Outcome: Progressing Goal: Will not experience complications related to urinary retention Outcome: Progressing   Problem: Pain Managment: Goal: General experience of comfort will improve Outcome: Progressing   Problem: Safety: Goal: Ability to remain free from injury will improve Outcome: Progressing   Problem: Skin Integrity: Goal: Risk for impaired skin integrity will decrease Outcome: Progressing

## 2023-03-23 NOTE — Progress Notes (Signed)
[facility] MD Progress Note  Betty Henderson  MRN:  [id number]  Subjective: Case discussed in multidisciplinary meeting today, chart reviewed, patient seen today during rounds.  Social worker informed that patient has been assigned a guardian via court, the final/official  court hearing is on Jan 14th, 2025  Chart reviewed, case discussed in multidisciplinary team today, patient seen during rounds. No new acute events overnight.  Patient is more visible in the dayarea today.  Patient reports she is doing "Ok".  Patient reports she has talked to her son via phone couple of times this week.  Patient denies thoughts of harming herself or others.  Patient denies auditory or visual hallucinations.  Patient is awaiting placement.  Principal Problem: Schizoaffective disorder, bipolar type (HCC) Diagnosis: Principal Problem:   Schizoaffective disorder, bipolar type (HCC)   Past Psychiatric History: Schizoaffective disorder, bipolar type.  Past Medical History:  Past Medical History:  Diagnosis Date   Anemia    Arthritis    Chronic pain    Drug-seeking behavior    Malingering    Osteopetrosis    Psychosis (HCC)    Schizoaffective disorder, bipolar type (HCC)     Past Surgical History:  Procedure Laterality Date   MOUTH SURGERY     TUBAL LIGATION     Family History:  Family History  Family history unknown: Yes   Family Psychiatric  History: Unremarkable Social History:  Social History   Substance and Sexual Activity  Alcohol Use Not Currently   Comment: 1 cocktail 3 weeks ago     Social History   Substance and Sexual Activity  Drug Use Not Currently   Types: Cocaine   Comment: states "it's legal though"    Social History   Socioeconomic History   Marital status: Single    Spouse name: Not on file   Number of children: Not on file   Years of education: Not on file   Highest education level: Not on file  Occupational History   Not on file  Tobacco Use   Smoking status:  Every Day    Current packs/day: 0.50    Types: Cigarettes   Smokeless tobacco: Never  Vaping Use   Vaping status: Never Used  Substance and Sexual Activity   Alcohol use: Not Currently    Comment: 1 cocktail 3 weeks ago   Drug use: Not Currently    Types: Cocaine    Comment: states "it's legal though"   Sexual activity: Never  Other Topics Concern   Not on file  Social History Narrative   Not on file   Social Drivers of Health   Financial Resource Strain: Not on file  Food Insecurity: Food Insecurity Present (08/22/2022)   Hunger Vital Sign    Worried About Running Out of Food in the Last Year: Sometimes true    Ran Out of Food in the Last Year: Sometimes true  Transportation Needs: Patient Unable To Answer (08/22/2022)   PRAPARE - Transportation    Lack of Transportation (Medical): Patient unable to answer    Lack of Transportation (Non-Medical): Patient unable to answer  Recent Concern: Transportation Needs - Unmet Transportation Needs (06/13/2022)   Received [facility] [place]   [facility] - Transportation    Lack of Transportation (Medical): Not on file    Lack of Transportation (Non-Medical): Yes  Physical Activity: Not on file  Stress: Not on file  Social Connections: Unknown (08/06/2021)   Received from [facility], [facility]   Social Network  Social Network: Not on file   Additional Social History:     Patient is homeless at this time.  Social worker is working with DSS on [employer] application and  placement.                    Sleep Good  Appetite:  Good  Current Medications: Current Facility-Administered Medications  Medication Dose Route Frequency Provider Last Rate Last Admin   acetaminophen (TYLENOL) tablet 650 mg  650 mg Oral Q6H PRN [name], DO   650 mg at 03/17/23 0957   alum & mag hydroxide-simeth (MAALOX/MYLANTA) 200-200-20 MG/5ML suspension 30 mL  30 mL Oral Q4H PRN [name], DO   30 mL at  02/28/23 0153   aspirin EC tablet 81 mg  81 mg Oral Daily [name], MD   81 mg at 03/23/23 0944   atorvastatin (LIPITOR) tablet 20 mg  20 mg Oral Daily [name], MD   20 mg at 03/23/23 0943   diphenhydrAMINE (BENADRYL) capsule 50 mg  50 mg Oral Q6H PRN [name], DO   50 mg at 09/29/22 2254   Or   diphenhydrAMINE (BENADRYL) injection 50 mg  50 mg Intramuscular Q6H PRN [name], DO       glimepiride (AMARYL) tablet 8 mg  8 mg Oral Q breakfast [name], MD   8 mg at 03/23/23 0944   haloperidol (HALDOL) tablet 5 mg  5 mg Oral Q6H PRN [name], DO   5 mg at 09/28/22 2149   Or   haloperidol lactate (HALDOL) injection 5 mg  5 mg Intramuscular Q6H PRN [name], DO       ibuprofen (ADVIL) tablet 600 mg  600 mg Oral Q6H PRN [name], [name], MD   600 mg at 03/05/23 4403   linagliptin (TRADJENTA) tablet 5 mg  5 mg Oral Daily [name], MD   5 mg at 03/23/23 0944   LORazepam (ATIVAN) tablet 1 mg  1 mg Oral TID PRN [name], DO   1 mg at 02/24/23 2139   magnesium hydroxide (MILK OF MAGNESIA) suspension 30 mL  30 mL Oral Daily PRN [name], DO   30 mL at 03/04/23 1149   metFORMIN (GLUCOPHAGE) tablet 1,000 mg  1,000 mg Oral BH-q8a4p [name], DO   1,000 mg at 03/23/23 0943   ondansetron (ZOFRAN) tablet 4 mg  4 mg Oral Q4H PRN [name], MD   4 mg at 02/27/23 2016   oxybutynin (DITROPAN) tablet 5 mg  5 mg Oral BID [name], MD   5 mg at 03/23/23 0944   paliperidone (INVEGA SUSTENNA) injection 156 mg  156 mg Intramuscular Q28 days [name], MD   156 mg at 03/12/23 1516   pantoprazole (PROTONIX) EC tablet 80 mg  80 mg Oral Daily [name], DO   80 mg at 03/23/23 0943   topiramate (TOPAMAX) tablet 50 mg  50 mg Oral BH-q8a4p [name], DO   50 mg at 03/23/23 4742   traZODone (DESYREL) tablet 50 mg  50 mg Oral QHS PRN [name],  DO   50 mg at 03/22/23 2149   zonisamide (ZONEGRAN) capsule 100 mg  100 mg Oral QHS [name], DO   100 mg at 03/22/23 2149    Lab Results:  Results for orders placed or performed during the hospital encounter of 08/22/22 (from the  past 48 hours)  Basic metabolic panel     Status: Abnormal   Collection Time: 03/21/23  3:24 PM  Result Value Ref Range   Sodium 138 135 - 145 mmol/L   Potassium 3.7 3.5 - 5.1 mmol/L   Chloride 107 98 - 111 mmol/L   CO2 21 (L) 22 - 32 mmol/L   Glucose, Bld 158 (H) 70 - 99 mg/dL    Comment: Glucose reference range applies only to samples taken after fasting for at least 8 hours.   BUN 14 8 - 23 mg/dL   Creatinine, Ser 4.09 0.44 - 1.00 mg/dL   Calcium 9.6 8.9 - 81.1 mg/dL   GFR, Estimated >91 >47 mL/min    Comment: (NOTE) Calculated using the CKD-EPI Creatinine Equation (2021)    Anion gap 10 5 - 15    Comment: Performed at [facility], [address] [place], [place] [address]        Blood Alcohol level:  Lab Results  Component Value Date   [facility] <10 08/17/2022   ETH <10 01/11/2021      Musculoskeletal: Strength & Muscle Tone: within normal limits Gait & Station: normal Patient leans: N/A   Psychiatric Specialty Exam:   Presentation  General Appearance:  Casual; Neat   Eye Contact: Fair   Speech: Spontaneous   Speech Volume: Normal   Handedness: Right     Mood and Affect  Mood: "Ok"   Affect: Stable and pleasant    Thought Processes: Improved, at baseline   Descriptions of Associations: Intact   Orientation: Well-oriented   Thought Content: Improved, at baseline  Hallucinations:Denies  Ideas of Reference:None noted   Suicidal Thoughts:Denies SI, no intention or plan  Homicidal Thoughts:Denies HI, no intention or plan   Sensorium  Memory: Immediate Fair; Remote Poor   Judgment: Fair, pt is Rx compliant   Insight: Improved     Executive Functions   Concentration: Improved   Attention Span: Improved   Language: Fair     Psychomotor Activity  Psychomotor Activity: Normal  Assets  Assets: Communication Skills     Sleep  Sleep:Improved     Physical Exam: Physical Exam Vitals and nursing note reviewed.  Constitutional:      Appearance: Normal appearance. She is normal weight.  Neurological:     General: No focal deficit present.     Mental Status: She is alert.      Review of Systems  Constitutional: Negative.   HENT: Negative.    Eyes: Negative.   Respiratory: Negative.    Cardiovascular: Negative.   Gastrointestinal: Negative.   Genitourinary: Negative.   Musculoskeletal: Negative.   Skin: Negative.   Neurological: Negative.   Endo/Heme/Allergies: Negative.     Blood pressure 98/63, pulse 65, temperature 98.1 F (36.7 C), resp. rate 15, height 5\' 6"  (1.676 m), weight 86 kg, SpO2 98%. Body mass index is 30.59 kg/m.   Treatment Plan Summary: Daily contact with patient to assess and evaluate symptoms and progress in treatment, Medication management, and Plan continue current medications.   Continue to monitor patient on as needed meds   Patient received  dose of Invega Sustenna 156 mg IM on  03/12/23 Oxybutynin 5 mg by mouth twice daily to help with urinary incontinence Continue on Metformin and Amaryl.   Continue on atorvastatin for Mixed hyperlipidemia  Patient is awaiting placement    [name], MD

## 2023-03-23 NOTE — Progress Notes (Signed)
°   03/23/23 2156  Psych Admission Type (Psych Patients Only)  Admission Status Voluntary  Psychosocial Assessment  Patient Complaints None  Eye Contact Brief  Facial Expression Flat  Affect Flat  Interaction Minimal  Motor Activity Slow  Appearance/Hygiene Disheveled  Behavior Characteristics Cooperative  Mood Sad;Pleasant  Thought Process  Coherency WDL  Content WDL  Delusions None reported or observed  Perception WDL  Hallucination None reported or observed  Judgment Limited  Confusion None  Danger to Self  Current suicidal ideation? Denies  Agreement Not to Harm Self Yes  Description of Agreement verbaL  Danger to Others  Danger to Others None reported or observed   Progress note   D: Pt seen in her room. Pt denies SI, HI, AVH. Pt rates pain  0/10. Pt rates anxiety  0/10 and depression  0/10. Pt did not attend group this evening. States she attended earlier in the day and was out of her room for most of the day. No other concerns noted at this time.  A: Pt provided support and encouragement. Pt given scheduled medication as prescribed. PRNs as appropriate. Q15 min checks for safety.   R: Pt safe on the unit. Will continue to monitor.

## 2023-03-23 NOTE — Progress Notes (Signed)
 Patient cooperative and pleasant.  Sad affect.  Denies SI/HI.  This Clinical research associate observed patient responding to internal stimuli upon entering her room. Pain rate 2/10 in left arm.  Declined PRN pain medications.    Compliant with scheduled medications.  15 min checks in place for safety.  Patient present in the milieu for meals and groups.  Minimal interaction with peers and staff.

## 2023-03-23 NOTE — Plan of Care (Signed)
  Problem: Coping: Goal: Level of anxiety will decrease Outcome: Progressing   Problem: Pain Managment: Goal: General experience of comfort will improve Outcome: Progressing   Problem: Safety: Goal: Ability to remain free from injury will improve Outcome: Progressing   

## 2023-03-23 NOTE — Group Note (Signed)
Date:  03/23/2023 Time:  9:04 PM  Group Topic/Focus:  Wrap-Up Group:   The focus of this group is to help patients review their daily goal of treatment and discuss progress on daily workbooks.    Participation Level:  Did Not Attend  Participation Quality:      Affect:      Cognitive:      Insight: None  Engagement in Group:  None  Modes of Intervention:      Additional Comments:    [name] 03/23/2023, 9:04 PM

## 2023-03-24 DIAGNOSIS — F250 Schizoaffective disorder, bipolar type: Secondary | ICD-10-CM | POA: Diagnosis not present

## 2023-03-24 NOTE — BH IP Treatment Plan (Signed)
Interdisciplinary Treatment and Diagnostic Plan Update  03/24/2023 Time of Session: 9:30 AM  Betty Henderson MRN: [id number]  Principal Diagnosis: Schizoaffective disorder, bipolar type (HCC)  Secondary Diagnoses: Principal Problem:   Schizoaffective disorder, bipolar type (HCC)   Current Medications:  Current Facility-Administered Medications  Medication Dose Route Frequency Provider Last Rate Last Admin   acetaminophen (TYLENOL) tablet 650 mg  650 mg Oral Q6H PRN [name], DO   650 mg at 03/17/23 0957   alum & mag hydroxide-simeth (MAALOX/MYLANTA) 200-200-20 MG/5ML suspension 30 mL  30 mL Oral Q4H PRN [name], DO   30 mL at 02/28/23 0153   aspirin EC tablet 81 mg  81 mg Oral Daily [name], MD   81 mg at 03/24/23 1046   atorvastatin (LIPITOR) tablet 20 mg  20 mg Oral Daily [name], MD   20 mg at 03/24/23 1045   diphenhydrAMINE (BENADRYL) capsule 50 mg  50 mg Oral Q6H PRN [name], DO   50 mg at 09/29/22 2254   Or   diphenhydrAMINE (BENADRYL) injection 50 mg  50 mg Intramuscular Q6H PRN [name], DO       glimepiride (AMARYL) tablet 8 mg  8 mg Oral Q breakfast [name], MD   8 mg at 03/24/23 1047   haloperidol (HALDOL) tablet 5 mg  5 mg Oral Q6H PRN [name], DO   5 mg at 09/28/22 2149   Or   haloperidol lactate (HALDOL) injection 5 mg  5 mg Intramuscular Q6H PRN [name], DO       ibuprofen (ADVIL) tablet 600 mg  600 mg Oral Q6H PRN [name], [name], MD   600 mg at 03/05/23 0829   linagliptin (TRADJENTA) tablet 5 mg  5 mg Oral Daily [name], MD   5 mg at 03/24/23 1046   LORazepam (ATIVAN) tablet 1 mg  1 mg Oral TID PRN [name], DO   1 mg at 02/24/23 2139   magnesium hydroxide (MILK OF MAGNESIA) suspension 30 mL  30 mL Oral Daily PRN [name], DO   30 mL at 03/04/23 1149   metFORMIN (GLUCOPHAGE) tablet 1,000 mg  1,000 mg Oral BH-[id number]  [name], DO   1,000 mg at 03/24/23 1045   ondansetron (ZOFRAN) tablet 4 mg  4 mg Oral Q4H PRN [name], MD   4 mg at 02/27/23 2016   oxybutynin (DITROPAN) tablet 5 mg  5 mg Oral BID [name], MD   5 mg at 03/24/23 1046   paliperidone (INVEGA SUSTENNA) injection 156 mg  156 mg Intramuscular Q28 days [name], MD   156 mg at 03/12/23 1516   pantoprazole (PROTONIX) EC tablet 80 mg  80 mg Oral Daily [name], DO   80 mg at 03/24/23 1045   topiramate (TOPAMAX) tablet 50 mg  50 mg Oral BH-q8a4p [name], DO   50 mg at 03/24/23 1046   traZODone (DESYREL) tablet 50 mg  50 mg Oral QHS PRN [name], DO   50 mg at 03/23/23 2112   zonisamide (ZONEGRAN) capsule 100 mg  100 mg Oral QHS [name], DO   100 mg at 03/23/23 2112   PTA Medications: Medications Prior to Admission  Medication Sig Dispense Refill Last Dose/Taking   ondansetron (ZOFRAN) 4 MG tablet Take 1 tablet (4 mg total) by mouth every 8 (eight) hours as needed for nausea  or vomiting. (Patient not taking: Reported on 08/17/2022) 20 tablet 0     Patient Stressors: Financial difficulties   Health problems   Medication change or noncompliance    Patient Strengths: Ability for insight   Treatment Modalities: Medication Management, Group therapy, Case management,  1 to 1 session with clinician, Psychoeducation, Recreational therapy.   Physician Treatment Plan for Primary Diagnosis: Schizoaffective disorder, bipolar type (HCC) Long Term Goal(s): Improvement in symptoms so as ready for discharge   Short Term Goals: Ability to identify changes in lifestyle to reduce recurrence of condition will improve Ability to verbalize feelings will improve Ability to disclose and discuss suicidal ideas Ability to demonstrate self-control will improve Ability to identify and develop effective coping behaviors will improve Ability to maintain clinical  measurements within normal limits will improve Compliance with prescribed medications will improve Ability to identify triggers associated with substance abuse/mental health issues will improve  Medication Management: Evaluate patient's response, side effects, and tolerance of medication regimen.  Therapeutic Interventions: 1 to 1 sessions, Unit Group sessions and Medication administration.  Evaluation of Outcomes: Progressing  Physician Treatment Plan for Secondary Diagnosis: Principal Problem:   Schizoaffective disorder, bipolar type (HCC)  Long Term Goal(s): Improvement in symptoms so as ready for discharge   Short Term Goals: Ability to identify changes in lifestyle to reduce recurrence of condition will improve Ability to verbalize feelings will improve Ability to disclose and discuss suicidal ideas Ability to demonstrate self-control will improve Ability to identify and develop effective coping behaviors will improve Ability to maintain clinical measurements within normal limits will improve Compliance with prescribed medications will improve Ability to identify triggers associated with substance abuse/mental health issues will improve     Medication Management: Evaluate patient's response, side effects, and tolerance of medication regimen.  Therapeutic Interventions: 1 to 1 sessions, Unit Group sessions and Medication administration.  Evaluation of Outcomes: Progressing   RN Treatment Plan for Primary Diagnosis: Schizoaffective disorder, bipolar type (HCC) Long Term Goal(s): Knowledge of disease and therapeutic regimen to maintain health will improve  Short Term Goals: Ability to remain free from injury will improve, Ability to verbalize frustration and anger appropriately will improve, Ability to demonstrate self-control, Ability to participate in decision making will improve, Ability to verbalize feelings will improve, Ability to disclose and discuss suicidal ideas, Ability  to identify and develop effective coping behaviors will improve, and Compliance with prescribed medications will improve  Medication Management: RN will administer medications as ordered by provider, will assess and evaluate patient's response and provide education to patient for prescribed medication. RN will report any adverse and/or side effects to prescribing provider.  Therapeutic Interventions: 1 on 1 counseling sessions, Psychoeducation, Medication administration, Evaluate responses to treatment, Monitor vital signs and CBGs as ordered, Perform/monitor CIWA, COWS, AIMS and Fall Risk screenings as ordered, Perform wound care treatments as ordered.  Evaluation of Outcomes: Progressing   LCSW Treatment Plan for Primary Diagnosis: Schizoaffective disorder, bipolar type (HCC) Long Term Goal(s): Safe transition to appropriate next level of care at discharge, Engage patient in therapeutic group addressing interpersonal concerns.  Short Term Goals: Engage patient in aftercare planning with referrals and resources, Increase social support, Increase ability to appropriately verbalize feelings, Increase emotional regulation, Facilitate acceptance of mental health diagnosis and concerns, Facilitate patient progression through stages of change regarding substance use diagnoses and concerns, Identify triggers associated with mental health/substance abuse issues, and Increase skills for wellness and recovery  Therapeutic Interventions: Assess for all discharge needs, 1 to  1 time with Child psychotherapist, Explore available resources and support systems, Assess for adequacy in community support network, Educate family and significant other(s) on suicide prevention, Complete Psychosocial Assessment, Interpersonal group therapy.  Evaluation of Outcomes: Progressing   Progress in Treatment: Attending groups: Yes. and No. Participating in groups: Yes. and No. Taking medication as prescribed: Yes. Toleration  medication: Yes. Family/Significant other contact made: Yes, individual(s) contacted:  Chanya Morison, son, [phone]. Patient understands diagnosis: Yes. and No. Discussing patient identified problems/goals with staff: Yes. Medical problems stabilized or resolved: Yes. Denies suicidal/homicidal ideation: Yes. Issues/concerns per patient self-inventory: No. Other: none.    New problem(s) identified: No, Describe:  none Updates 12/12/22: No changes made at this time Updates 12/17/22: No changes made at this time Update 12/24/22 No changes at this time. Update 12/29/2022: No changes at this time. Update 01/03/23: No changes at this time Update 01/08/23: No changes at this time Update 01/13/23: No changes at this time Update 01/18/23: None at this time. Update 01/23/2023:  No changes at this time. Update 01/28/23: Pt recently diagnosed with Diabetes Type II  Update 02/02/23: None at this time. Update 02/07/23: No changes at this time Update 02/12/23: No changes at this time Update 02/17/23: No changes at this time. Update: 02/22/2023: No changes at this time. Update 02/27/23: No changes at this time Update 03/04/23: No changes at this time  Update 03/09/23: No changes at this time Update 03/14/23: No changes at this time.  Update 03/19/23: No changes at this time. Update 03/24/23: No changes at this time.      New Short Term/Long Term Goal(s): Update 6/30: none at this time. Update 09/27/2022:  No changes at this time.  Update 10/02/2022:  No changes at this time. Update 10/07/22: No changes at this time 10/12/22: No changes at this time Update 10/18/22: No changes at this time Update 10/23/22: No changes at this time Update 10/28/22: No changes at this time Update 11/07/22: No changes at this time Update 11/12/22: No changes at this time  Update 11/17/22: None at this time. Update 11/22/22: None at this time. Update 11/27/22 No changes at this time. Update 12/02/22 No changes at this time  Update 12/07/22: None at this  time. Updates 12/12/22: No changes made at this time Updates 12/17/22: No changes made at this time Update 12/24/22 No changes at this time. Update 12/29/2022: No changes at this time. Update 01/03/23: No changes at this time Update 01/08/23: No changes at this time Update 01/13/23: No changes at this time   Update 01/18/23: No changes at this time. Update 01/23/2023:  No changes at this time. Update 01/28/2023:  No changes at this time. Update 02/02/23: None at this time.  Update 02/07/23: No changes at this time Update 02/12/23: No changes at this time Update 02/17/23: No changes at this time. Update: 02/22/2023: No changes at this time. Update 02/27/23: No changes at this time Update 03/04/23: No changes at this time Update 03/09/23: No changes at this time Update 03/14/23: No changes at this time. 03/19/23 Update: No changes at this time. Update 03/24/23: No changes at this time.      Patient Goals:  Update 6/30: none at this time. Update 09/27/2022:  No changes at this time. Update 10/02/2022:  No changes at this time. Update 10/07/22: No changes at this time 10/12/22: No changes at this time Update 10/18/22: No changes at this time Update 10/23/22: No changes at this time Update 10/28/22: No changes at this time  Update 11/07/22: No changes at this time Update 11/12/22: No changes at this time Update 11/17/22: None at this time. Update 11/22/22: None at this time. Update 11/27/22 No changes at this time Update 12/02/22 No changes at this time   Update 12/07/22: None at this time. Updates 12/12/22: No changes made at this time Updates 12/17/22: No changes made at this time Update 12/24/22 No changes at this time. Update 12/29/2022: No changes at this time. Update 01/03/23: No changes at this time Update 01/08/23: No changes at this time Update 01/13/23: No changes at this time   Update 01/18/23: No changes at this time. Update 01/23/2023:  No changes at this time.  Update 01/28/2023:  No changes at this time. Update 02/02/23: None at  this time.  Update 02/07/23: No changes at this time Update 02/12/23: No changes at this time Update 02/17/23: No changes at this time. Update: 02/22/2023: No changes at this time. Update 02/27/23: No changes at this time Update 03/04/23: No changes at this time  Update 03/09/23: No changes at this time Update 03/14/23: No changes at this time. 03/19/23 Update: No changes at this time. Update 03/24/23: No changes at this time.      Discharge Plan or Barriers: Update 6/30: APS report has been made and patient is being investigated for guardianship needs.  Remains homeless with limited supports. Update 09/27/2022:  No changes at this time.  Update 10/02/2022:  Patient remains safe on the unit at this time.  Patient remains psychotic at this time.  APS report has been made, however, no follow up from the caseworker on her case.  No safe discharge identified.  CSW has requested that application for Medicaid and disability be completed.   Update 10/07/22: No changes at this time 10/12/22: No changes at this time Update 10/18/22: No changes at this time Update 10/23/22: No changes at this time Update 10/23/22: No changes at this time Update 10/28/22: According to [facility], pt's caseworker at DSS, the petition was filed for guardianship on 10/21/22, now awaiting for the petition to be approvedUpdate 11/07/22: No changes at this time Update 11/12/22: CSW sent over patients information to [facility] and [facility], awaiting a response. CSW awaiting word from DSS regarding pt's petition and what agency will become her guardian. Update 11/17/22: No changes at this time. Update 11/22/22: CSW has sent patients information to multiple facilities that were suggested by leadership, pt has been denied from the facility or the facility does not respond. CSW continues to send pt's information to nursing homes. Update 11/27/22 CSW contacted DSS to inquire about the status of guardianship. No response at this time Update 12/02/22 Supervisor  Loraine Leriche contacted DSS who stated they are not taking pt on for guardianship but that Quincy Carnes will be present tomorrow to do a visit with social worker and pt and that pt's son will be signing documentation for placement for the pt. Update 12/07/22: [facility] DSS has declined to take guardianship. Son, Casimiro Needle is to step into a more present role in deciding placement. A conversation is still needed. Updates 12/12/22: CSW staffed case with leadership and they state that a family meeting must happen with pt's son. CSW contacted pt's financial navigator and her DSS worker so that they may be present on the call and are able to speak to the efforts they have made to secure pt funding and housing. CSW waiting on responses from both, leadership is aware. Updates 12/17/22: CSW awaiting updates from both  [facility] DSS and [employer] financial navigators as to status of pt's applications for Medicaid/[employer] and Disability, respectively. Update 12/24/22 CSW sent email regarding family meeting per request of supervisors. CSW awaiting responses from all parties to schedule family meeting. Update: 12/29/2022 No changes at this time. Update 01/03/23: CSW continues to await responses on the scheduling of the family meeting. CSW received call from Primitivo Gauze at [employer], CSW sent FL2 to [place] to aide in pt's discharge planning. Update 01/08/23: CSW has reached out again to leadership and financial counseling. CSW continues to await a response. CSW called Sharlee Blew, awaiting a response for status of pt's placement options. Update 01/13/23: Pt is to meet with psychologist tomorrow 10/22 to complete capacity testing so that DSS can retry for guardianship  Update 01/18/23: The patient is still awaiting placement.  Update 01/23/2023:  Patient remains safe on the unit at this time.  CSW team continues to work on placement.  CSW team in contact with DSS to develop plan on guardianship.   Work continues to  Hospital doctor. Update 01/28/2023:  Medicaid application remains pending. DSS awaiting records from medical records to retry fo guardianship.  Update 02/02/23: None at this time.  Update 02/07/23: Sharlee Blew at DSS confirms that pt records were received and that they are petitioning for guardianship, she informs CSW that decision should be made on court date by Monday or Tuesday of next week. CSW still awaits status update on Medicaid application Update 02/12/23: Medicaid Application still pending. TOC Supervisors recommend that CSW continue searching for placement as we await DSS taking guardianship. Update 02/17/23: DSS confirms guardianship date for January 14th, 2025. CSW continue to search for placement. Update: 02/22/2023: No changes at this time.  Update 02/27/23: CSW sent pt's FL2 to multiple different placements. Awaiting to hear back from placements. Update 03/04/23: No changes at this time  Update 03/09/23: No changes at this time Update 03/14/23: CSW still awaits to hear from placements for pt. Pt remains stable on the unit. According to DSS pt has been legally considered disabled but needs to complete disability application. CSW has reached out to financial navigators to complete this. Update 03/19/23 : No changes at this time. Update 03/24/23: CSW continues to search for placement at this time.           Reason for Continuation of Hospitalization: Hallucinations Mania Medication stabilization   Estimated Length of Stay:  Update 6/30: 1-7 days Update 09/27/2022:  TBD Update 10/02/2022:  No changes at this time. UpdaUpdate 10/18/22: No changes at this time te 10/07/22: No changes at this time Update 10/18/22: No changes at this time Update 10/23/22: No changes at this time Update 10/28/22: No changes at this time Update 11/07/22: No changes at this time Update 11/12/22: No changes at this time  Update 11/17/22: No changes at this time. Update 11/22/22: None at this time. Update 11/27/22 No  changes at this time. Update 12/02/22 No changes at this time  Update 12/07/22: None at this time. Updates 12/12/22: No changes made at this time Updates 12/17/22: TBD. Update 12/29/2022: No changes at this time. Update 01/03/23: TBDUpdate 01/08/23: TBD Update 01/13/23: TBD  Update 01/18/23: No changes at this time.  Update 01/23/2023:  TBD Update 01/28/2023:  TBD Update 02/02/23: None at this time. Update 02/07/23: TBD Update 02/12/23: TBD Update 02/17/23: TBD. Update: 02/22/2023: TBD. Update: 02/27/2023: TBD. Update: 03/04/2023: TBD. Update 03/09/23: TBD Update 03/14/23: TBD. 03/19/23 Update: TBD. Update 03/24/23: TBD  Last 3 [place] Suicide Severity Risk Score: Flowsheet Row Admission (Current) from 08/22/2022 in [facility] BEHAVIORAL MEDICINE ED from 08/17/2022 in [facility] Emergency Department at [facility] ED from 08/12/2022 in [facility] Emergency Department at [facility]  C-SSRS RISK CATEGORY Low Risk No Risk No Risk       Last [facility] 2/9 Scores:     No data to display          Scribe for Treatment Team: Laretta Alstrom 03/24/2023 12:33 PM

## 2023-03-24 NOTE — Group Note (Signed)
Recreation Therapy Group Note   Group Topic:Health and Wellness  Group Date: 03/24/2023 Start Time: 1400 End Time: 1500 Facilitators: Rosina Lowenstein, LRT, CTRS Location: Courtyard  Group Description: Outdoor Recreation. Patients had the option to play corn hole, ring toss, bowling or listening to music while outside in the courtyard getting fresh air and sunlight. LRT and patients discussed things that they enjoy doing in their free time outside of the hospital. LRT encouraged patients to drink water after being active and getting their heart rate up.   Goal Area(s) Addressed: Patient will identify leisure interests.  Patient will practice healthy decision making. Patient will engage in recreation activity.   Affect/Mood: Appropriate   Participation Level: Active and Engaged   Participation Quality: Independent   Behavior: Appropriate, Calm, and Cooperative   Speech/Thought Process: Coherent   Insight: Good   Judgement: Good   Modes of Intervention: Exploration, Music, Open Conversation, and Rapport Building   Patient Response to Interventions:  Attentive, Engaged, and Receptive   Education Outcome:  Acknowledges education   Clinical Observations/Individualized Feedback: Betty Henderson was active in their participation of session activities and group discussion. Pt was observed dancing and laughing while outside listening to the music. Pt shared that she was very excited to go outside and that it feels so good out. Pt interacted well with LRT and peers duration of session.    Plan: Continue to engage patient in RT group sessions 2-3x/week.   Rosina Lowenstein, LRT, CTRS 03/24/2023 4:42 PM

## 2023-03-24 NOTE — Group Note (Signed)
Date:  03/24/2023 Time:  11:50 PM  Group Topic/Focus:  Wrap-Up Group:   The focus of this group is to help patients review their daily goal of treatment and discuss progress on daily workbooks.    Participation Level:  Did Not Attend  Participation Quality:      Affect:      Cognitive:      Insight: None  Engagement in Group:  None  Modes of Intervention:      Additional Comments:    [name] 03/24/2023, 11:50 PM

## 2023-03-24 NOTE — Progress Notes (Signed)
   03/24/23 0556  15 Minute Checks  Location Bedroom  Visual Appearance Calm  Behavior Sleeping  Sleep (Behavioral Health Patients Only)  Calculate sleep? (Click Yes once per 24 hr at 0600 safety check) Yes  Documented sleep last 24 hours 12.5

## 2023-03-24 NOTE — Plan of Care (Signed)
  Problem: Education: Goal: Knowledge of General Education information will improve Description: Including pain rating scale, medication(s)/side effects and non-pharmacologic comfort measures Outcome: Progressing   Problem: Health Behavior/Discharge Planning: Goal: Ability to manage health-related needs will improve Outcome: Progressing   Problem: Clinical Measurements: Goal: Ability to maintain clinical measurements within normal limits will improve Outcome: Progressing Goal: Diagnostic test results will improve Outcome: Progressing   Problem: Nutrition: Goal: Adequate nutrition will be maintained Outcome: Progressing   Problem: Coping: Goal: Level of anxiety will decrease Outcome: Progressing   Problem: Elimination: Goal: Will not experience complications related to bowel motility Outcome: Progressing Goal: Will not experience complications related to urinary retention Outcome: Progressing   Problem: Pain Managment: Goal: General experience of comfort will improve Outcome: Progressing   Problem: Safety: Goal: Ability to remain free from injury will improve Outcome: Progressing   Problem: Skin Integrity: Goal: Risk for impaired skin integrity will decrease Outcome: Progressing

## 2023-03-24 NOTE — Plan of Care (Signed)
  Problem: Health Behavior/Discharge Planning: Goal: Ability to manage health-related needs will improve Outcome: Progressing   Problem: Clinical Measurements: Goal: Ability to maintain clinical measurements within normal limits will improve Outcome: Progressing Goal: Diagnostic test results will improve Outcome: Progressing   Problem: Nutrition: Goal: Adequate nutrition will be maintained Outcome: Progressing   Problem: Coping: Goal: Level of anxiety will decrease Outcome: Progressing   Problem: Elimination: Goal: Will not experience complications related to bowel motility Outcome: Progressing Goal: Will not experience complications related to urinary retention Outcome: Progressing   Problem: Pain Managment: Goal: General experience of comfort will improve Outcome: Progressing   Problem: Safety: Goal: Ability to remain free from injury will improve Outcome: Progressing   Problem: Skin Integrity: Goal: Risk for impaired skin integrity will decrease Outcome: Progressing

## 2023-03-24 NOTE — Plan of Care (Signed)
°  Problem: Nutrition: °Goal: Adequate nutrition will be maintained °Outcome: Progressing °  °Problem: Coping: °Goal: Level of anxiety will decrease °Outcome: Progressing °  °Problem: Safety: °Goal: Ability to remain free from injury will improve °Outcome: Progressing °  °

## 2023-03-24 NOTE — Progress Notes (Signed)
Patient isolative to self and room. Voiced no complaints or concerns. Medications provided without incident. Prn given for sleep with good relief. Denies SI, HI, AVH.  Encouragement and support provided. Safety checks maintained. Meds given as prescribed. Pt receptive and remains safe on unit with q 15 min checks.

## 2023-03-24 NOTE — Progress Notes (Signed)
   03/24/23 0713  Psych Admission Type (Psych Patients Only)  Admission Status Voluntary  Psychosocial Assessment  Patient Complaints None  Eye Contact Fair  Facial Expression Flat  Affect Flat  Speech Logical/coherent  Interaction Minimal  Motor Activity Slow  Appearance/Hygiene Disheveled  Behavior Characteristics Cooperative  Mood Pleasant  Thought Process  Coherency WDL  Content WDL  Delusions None reported or observed  Perception WDL  Hallucination None reported or observed  Judgment Impaired  Confusion None  Danger to Self  Current suicidal ideation? Denies  Danger to Others  Danger to Others None reported or observed  Danger to Others Abnormal  Harmful Behavior to others No threats or harm toward other people

## 2023-03-24 NOTE — Progress Notes (Incomplete)
[facility] MD Progress Note  Betty Henderson  MRN:  [id number]  Subjective: Case discussed in multidisciplinary meeting today, chart reviewed, patient seen today during rounds.  Social worker informed that patient has been assigned a guardian via court, the final/official  court hearing is on Jan 14th, 2025  Chart reviewed, case discussed in multidisciplinary team today, patient seen during rounds.  No behavioral issues reported by the staff.  No new acute events overnight.  Patient is more visible in the dayarea today.  Patient reports she is doing "Ok".  Patient reports she has talked to her son via phone couple of times this week.  Patient denies thoughts of harming herself or others.  Patient denies auditory or visual hallucinations.  Patient is awaiting placement.  Principal Problem: Schizoaffective disorder, bipolar type (HCC) Diagnosis: Principal Problem:   Schizoaffective disorder, bipolar type (HCC)   Past Psychiatric History: Schizoaffective disorder, bipolar type.  Past Medical History:  Past Medical History:  Diagnosis Date   Anemia    Arthritis    Chronic pain    Drug-seeking behavior    Malingering    Osteopetrosis    Psychosis (HCC)    Schizoaffective disorder, bipolar type (HCC)     Past Surgical History:  Procedure Laterality Date   MOUTH SURGERY     TUBAL LIGATION     Family History:  Family History  Family history unknown: Yes   Family Psychiatric  History: Unremarkable Social History:  Social History   Substance and Sexual Activity  Alcohol Use Not Currently   Comment: 1 cocktail 3 weeks ago     Social History   Substance and Sexual Activity  Drug Use Not Currently   Types: Cocaine   Comment: states "it's legal though"    Social History   Socioeconomic History   Marital status: Single    Spouse name: Not on file   Number of children: Not on file   Years of education: Not on file   Highest education level: Not on file  Occupational History    Not on file  Tobacco Use   Smoking status: Every Day    Current packs/day: 0.50    Types: Cigarettes   Smokeless tobacco: Never  Vaping Use   Vaping status: Never Used  Substance and Sexual Activity   Alcohol use: Not Currently    Comment: 1 cocktail 3 weeks ago   Drug use: Not Currently    Types: Cocaine    Comment: states "it's legal though"   Sexual activity: Never  Other Topics Concern   Not on file  Social History Narrative   Not on file   Social Drivers of Health   Financial Resource Strain: Not on file  Food Insecurity: Food Insecurity Present (08/22/2022)   Hunger Vital Sign    Worried About Running Out of Food in the Last Year: Sometimes true    Ran Out of Food in the Last Year: Sometimes true  Transportation Needs: Patient Unable To Answer (08/22/2022)   PRAPARE - Transportation    Lack of Transportation (Medical): Patient unable to answer    Lack of Transportation (Non-Medical): Patient unable to answer  Recent Concern: Transportation Needs - Unmet Transportation Needs (06/13/2022)   Received from [facility], [facility]   [facility] - Transportation    Lack of Transportation (Medical): Not on file    Lack of Transportation (Non-Medical): Yes  Physical Activity: Not on file  Stress: Not on file  Social Connections: Unknown (08/06/2021)   Received  from [facility], [facility]   Social Network    Social Network: Not on file   Additional Social History:     Patient is homeless at this time.  Social worker is working with DSS on [employer] application and  placement.                    Sleep Good  Appetite:  Good  Current Medications: Current Facility-Administered Medications  Medication Dose Route Frequency Provider Last Rate Last Admin   acetaminophen (TYLENOL) tablet 650 mg  650 mg Oral Q6H PRN [name], DO   650 mg at 03/17/23 0957   alum & mag hydroxide-simeth (MAALOX/MYLANTA) 200-200-20 MG/5ML suspension 30 mL  30 mL Oral  Q4H PRN [name], DO   30 mL at 02/28/23 0153   aspirin EC tablet 81 mg  81 mg Oral Daily [name], MD   81 mg at 03/24/23 1046   atorvastatin (LIPITOR) tablet 20 mg  20 mg Oral Daily [name], MD   20 mg at 03/24/23 1045   diphenhydrAMINE (BENADRYL) capsule 50 mg  50 mg Oral Q6H PRN [name], DO   50 mg at 09/29/22 2254   Or   diphenhydrAMINE (BENADRYL) injection 50 mg  50 mg Intramuscular Q6H PRN [name], DO       glimepiride (AMARYL) tablet 8 mg  8 mg Oral Q breakfast [name], MD   8 mg at 03/24/23 1047   haloperidol (HALDOL) tablet 5 mg  5 mg Oral Q6H PRN [name], DO   5 mg at 09/28/22 2149   Or   haloperidol lactate (HALDOL) injection 5 mg  5 mg Intramuscular Q6H PRN [name], DO       ibuprofen (ADVIL) tablet 600 mg  600 mg Oral Q6H PRN [name], [name], MD   600 mg at 03/05/23 4098   linagliptin (TRADJENTA) tablet 5 mg  5 mg Oral Daily [name], MD   5 mg at 03/24/23 1046   LORazepam (ATIVAN) tablet 1 mg  1 mg Oral TID PRN [name], DO   1 mg at 02/24/23 2139   magnesium hydroxide (MILK OF MAGNESIA) suspension 30 mL  30 mL Oral Daily PRN [name], DO   30 mL at 03/04/23 1149   metFORMIN (GLUCOPHAGE) tablet 1,000 mg  1,000 mg Oral BH-q8a4p [name], DO   1,000 mg at 03/24/23 1045   ondansetron (ZOFRAN) tablet 4 mg  4 mg Oral Q4H PRN [name], MD   4 mg at 02/27/23 2016   oxybutynin (DITROPAN) tablet 5 mg  5 mg Oral BID [name], MD   5 mg at 03/24/23 1046   paliperidone (INVEGA SUSTENNA) injection 156 mg  156 mg Intramuscular Q28 days [name], MD   156 mg at 03/12/23 1516   pantoprazole (PROTONIX) EC tablet 80 mg  80 mg Oral Daily [name], DO   80 mg at 03/24/23 1045   topiramate (TOPAMAX) tablet 50 mg  50 mg Oral BH-q8a4p [name], DO   50 mg at 03/24/23 1046   traZODone (DESYREL) tablet 50  mg  50 mg Oral QHS PRN [name], DO   50 mg at 03/23/23 2112   zonisamide (ZONEGRAN) capsule 100 mg  100 mg Oral QHS [name], DO   100 mg at 03/23/23 2112    Lab Results:  No results  found for this or any previous visit (from the past 48 hours).       Blood Alcohol level:  Lab Results  Component Value Date   ETH <10 08/17/2022   ETH <10 01/11/2021      Musculoskeletal: Strength & Muscle Tone: within normal limits Gait & Station: normal Patient leans: N/A   Psychiatric Specialty Exam:   Presentation  General Appearance:  Casual; Neat   Eye Contact: Fair   Speech: Spontaneous   Speech Volume: Normal   Handedness: Right     Mood and Affect  Mood: "Ok"   Affect: Stable and pleasant    Thought Processes: Improved, at baseline   Descriptions of Associations: Intact   Orientation: Well-oriented   Thought Content: Improved, at baseline  Hallucinations:Denies  Ideas of Reference:None noted   Suicidal Thoughts:Denies SI, no intention or plan  Homicidal Thoughts:Denies HI, no intention or plan   Sensorium  Memory: Immediate Fair; Remote Poor   Judgment: Fair, pt is Rx compliant   Insight: Improved     Executive Functions  Concentration: Improved   Attention Span: Improved   Language: Fair     Psychomotor Activity  Psychomotor Activity: Normal  Assets  Assets: Communication Skills     Sleep  Sleep:Improved     Physical Exam: Physical Exam Vitals and nursing note reviewed.  Constitutional:      Appearance: Normal appearance. She is normal weight.  Neurological:     General: No focal deficit present.     Mental Status: She is alert.      Review of Systems  Constitutional: Negative.   HENT: Negative.    Eyes: Negative.   Respiratory: Negative.    Cardiovascular: Negative.   Gastrointestinal: Negative.   Genitourinary: Negative.   Musculoskeletal: Negative.   Skin: Negative.    Neurological: Negative.   Endo/Heme/Allergies: Negative.     Blood pressure 95/61, pulse 78, temperature (!) 97.4 F (36.3 C), resp. rate 18, height 5\' 6"  (1.676 m), weight 86 kg, SpO2 100%. Body mass index is 30.59 kg/m.   Treatment Plan Summary: Daily contact with patient to assess and evaluate symptoms and progress in treatment, Medication management, and Plan continue current medications.   Continue to monitor patient on as needed meds   Patient received  dose of Invega Sustenna 156 mg IM on  03/12/23 Oxybutynin 5 mg by mouth twice daily to help with urinary incontinence Continue on Metformin and Amaryl.   Continue on atorvastatin for Mixed hyperlipidemia  Patient is awaiting placement    [name], MD

## 2023-03-24 NOTE — BHH Counselor (Signed)
CSW touched base with Malvin Johns at [phone] with [facility] for possible placement.    Wallis Bamberg confirmed that they do have a female bed available. CSW sent over patient's FL2 and requested documents. Kurian added that she will be available to do an assessment with the patient tomorrow 03/25/23 after she reviews documentation.   CSW touched base with patient's primary CSW. CSW to touch base with Kurian in the morning as Wallis Bamberg has further questions regarding Medicaid.   CSW team to continue to assess.    Reymundo Poll, MSW, LCSWA 03/24/2023 4:05 PM

## 2023-03-24 NOTE — Group Note (Signed)
Date:  03/24/2023 Time:  11:08 AM  Group Topic/Focus:  Movie Group The purpose of this group is to allow patients to have leisure time watching movies while having snacks     Participation Level:  Active  Participation Quality:  Appropriate  Affect:  Appropriate  Cognitive:  Alert and Appropriate  Insight: Appropriate  Engagement in Group:  Engaged  Modes of Intervention:  Activity  Additional Comments:    [name] 03/24/2023, 11:08 AM

## 2023-03-25 DIAGNOSIS — F250 Schizoaffective disorder, bipolar type: Secondary | ICD-10-CM | POA: Diagnosis not present

## 2023-03-25 NOTE — BHH Counselor (Signed)
 Malvin Johns reports that she is willing to take pt immediately if LOG is complete. Anitha agrees to meet pt and assess pt today at 3:00 PM.   Reynaldo Minium, MSW, [facility] 03/25/2023 1:14 PM

## 2023-03-25 NOTE — BHH Counselor (Addendum)
 CSW Madison A. spoke with H. Mackie, interior and spatial designer of  [facility] at [phone].    Ms. Mackie confirmed bed availability.    Ms. Mackie requested patient's FL2 and required documents for admission. CSW has provided these documents via email and fax to [phone] and [email or web address] at Ms. Forman's request.    CSW Madison A. And Team to continue to assess.    Kherington Meraz, MSW, LCSWA 03/25/2023 2:44 PM

## 2023-03-25 NOTE — Progress Notes (Signed)
   03/25/23 0724  Psych Admission Type (Psych Patients Only)  Admission Status Voluntary  Psychosocial Assessment  Patient Complaints None  Eye Contact Fair  Facial Expression Flat  Affect Flat  Speech Logical/coherent  Interaction Minimal  Motor Activity Slow  Appearance/Hygiene Disheveled  Behavior Characteristics Calm  Mood Pleasant;Euthymic  Thought Process  Coherency WDL  Content WDL  Delusions None reported or observed  Perception WDL  Hallucination None reported or observed  Judgment Impaired  Confusion None  Danger to Self  Current suicidal ideation? Denies  Danger to Others  Danger to Others None reported or observed  Danger to Others Abnormal  Harmful Behavior to others No threats or harm toward other people

## 2023-03-25 NOTE — BHH Counselor (Signed)
 CSW met with pt and [facility].   Manager reports that pt can come as soon as Friday if they receive FL2 , TB Xray and Bloodtest, 5 days of medications and paper prescriptions and if she has a copy of pt's ID and completed LOG.   CSW informed provider in preporation for her discharge.    Lum Croft, MSW, [place] 03/25/2023 3:57 PM

## 2023-03-25 NOTE — Group Note (Signed)
 BHH LCSW Group Therapy Note   Group Date: 03/25/2023 Start Time: 1300 End Time: 1330   Type of Therapy/Topic:  Group Therapy:  Emotion Regulation  Participation Level:  Did Not Attend   Mood:  Description of Group:    The purpose of this group is to assist patients in learning to regulate negative emotions and experience positive emotions. Patients will be guided to discuss ways in which they have been vulnerable to their negative emotions. These vulnerabilities will be juxtaposed with experiences of positive emotions or situations, and patients challenged to use positive emotions to combat negative ones. Special emphasis will be placed on coping with negative emotions in conflict situations, and patients will process healthy conflict resolution skills.  Therapeutic Goals: Patient will identify two positive emotions or experiences to reflect on in order to balance out negative emotions:  Patient will label two or more emotions that they find the most difficult to experience:  Patient will be able to demonstrate positive conflict resolution skills through discussion or role plays:   Summary of Patient Progress:   X    Therapeutic Modalities:   Cognitive Behavioral Therapy Feelings Identification Dialectical Behavioral Therapy   Betty Henderson, LCSWA

## 2023-03-25 NOTE — Group Note (Signed)
 Recreation Therapy Group Note   Group Topic:Personal Development  Group Date: 03/25/2023 Start Time: 1400 End Time: 1500 Facilitators: Celestia Jeoffrey BRAVO, LRT, CTRS Location:  Dayroom  Group Description: New Years Eve Handout. LRT and patients talk about the previous year and all that there is to look forward to in the coming year. LRT passes out a handout for patients to write their favorite memory, biggest lesson they've heard, biggest accomplishment, things they want to improve on, and ways to change their world around them. LRT passed out regular sized ink pens for patients to complete their worksheet with. LRT and patients discussed things that they wrote down with the group.   LRT offered to take patients outside after for fresh air and sunlight.   Goal Area(s) Addressed:  Patient will reminisce on previous year.  Patient will recall a past event in their life. Patient will identify goals for the upcoming year.  Patient will increase communication.    Affect/Mood: Appropriate   Participation Level: Active and Engaged   Participation Quality: Independent   Behavior: Calm and Cooperative   Speech/Thought Process: Loose association   Insight: Fair   Judgement: Fair    Modes of Intervention: Exploration, Guided Discussion, Worksheet, and Writing   Patient Response to Interventions:  Attentive and Receptive   Education Outcome:  In group clarification offered    Clinical Observations/Individualized Feedback: Garrie was mostly active in their participation of session activities and group discussion. Pt identified This summer was my favorite memory. Pt wrote one or two words for reach prompt. Pt chose to go outside to the courtyard after. Overall, pt interacted well with LRT and peers duration of session.    Plan: Continue to engage patient in RT group sessions 2-3x/week.   Jeoffrey BRAVO Celestia, LRT, CTRS 03/25/2023 4:30 PM

## 2023-03-25 NOTE — [place] FL2 (Signed)
 [place]  MEDICAID FL2 LEVEL OF CARE FORM     IDENTIFICATION  Patient Name: Betty Henderson Birthdate: [birth year] Sex: female Admission Date (Current Location): 08/22/2022  [facility] and [place] Number:  Producer, Television/film/video and Address:  [facility], [address] [place], [place] [address]      Provider Number: [id number]  Attending Physician Name and Address:  Cam Charlie Loving,*  Relative Name and Phone Number:  Aloura Matsuoka, son, [phone]    Current Level of Care: Hospital Recommended Level of Care: [facility] Prior Approval Number:    Date Approved/Denied:   PASRR Number:    Discharge Plan: Other (Comment) [facility])    Current Diagnoses: Patient Active Problem List   Diagnosis Date Noted   Schizoaffective disorder, bipolar type (HCC) 08/19/2022   Midline low back pain without sciatica 06/14/2022   Malingering 05/31/2022   Neck pain 05/08/2022   Non compliance w medication regimen 02/07/2020   Mild cognitive impairment 10/12/2018   Tobacco use disorder 09/14/2018   Homeless single person 08/22/2017   Arthritis 05/04/2016   Low vitamin B12 level 05/04/2016   Closed avulsion fracture of distal end of left fibula 11/18/2014   Sprain of left ankle 11/18/2014   Acute exacerbation of chronic paranoid schizophrenia (HCC) 09/03/2014    Orientation RESPIRATION BLADDER Height & Weight     Self, Place  Normal Incontinent Weight: 189 lb 8 oz (86 kg) Height:  5' 6 (167.6 cm)  BEHAVIORAL SYMPTOMS/MOOD NEUROLOGICAL BOWEL NUTRITION STATUS  Wanderer  (N/A) Continent Diet  AMBULATORY STATUS COMMUNICATION OF NEEDS Skin   Independent Verbally Normal                       Personal Care Assistance Level of Assistance              Functional Limitations Info  Speech     Speech Info: Impaired    SPECIAL CARE FACTORS FREQUENCY  Blood pressure Blood Pressure Frequency: Once every 12 hours                   Contractures Contractures Info: Not present    Additional Factors Info  Allergies Code Status Info: FULL Allergies Info: Lemon Oil, Proanthocyanidin, Strawberry Extract, Adhesive(tape), Cherry, Wound Dressing Adhesive Psychotropic Info: Invega  Sustenna 156 mg every 28 days         Current Medications (03/25/2023):  This is the current hospital active medication list Current Facility-Administered Medications  Medication Dose Route Frequency Provider Last Rate Last Admin   acetaminophen  (TYLENOL ) tablet 650 mg  650 mg Oral Q6H PRN Cam Charlie Loving, DO   650 mg at 03/17/23 0957   alum & mag hydroxide-simeth (MAALOX/MYLANTA) 200-200-20 MG/5ML suspension 30 mL  30 mL Oral Q4H PRN Cam Charlie Loving, DO   30 mL at 02/28/23 0153   aspirin  EC tablet 81 mg  81 mg Oral Daily Laurita Manor T, MD   81 mg at 03/25/23 1008   atorvastatin  (LIPITOR) tablet 20 mg  20 mg Oral Daily Zhang, Ping T, MD   20 mg at 03/25/23 1008   diphenhydrAMINE  (BENADRYL ) capsule 50 mg  50 mg Oral Q6H PRN Cam Charlie Loving, DO   50 mg at 09/29/22 2254   Or   diphenhydrAMINE  (BENADRYL ) injection 50 mg  50 mg Intramuscular Q6H PRN Cam Charlie Loving, DO       glimepiride  (AMARYL ) tablet 8 mg  8 mg Oral Q breakfast Laurita,  Cort DASEN, MD   8 mg at 03/25/23 1008   haloperidol  (HALDOL ) tablet 5 mg  5 mg Oral Q6H PRN Cam Charlie Loving, DO   5 mg at 09/28/22 2149   Or   haloperidol  lactate (HALDOL ) injection 5 mg  5 mg Intramuscular Q6H PRN Cam Charlie Loving, DO       ibuprofen  (ADVIL ) tablet 600 mg  600 mg Oral Q6H PRN [name], [name], MD   600 mg at 03/24/23 1652   linagliptin  (TRADJENTA ) tablet 5 mg  5 mg Oral Daily Laurita Cort T, MD   5 mg at 03/25/23 1007   LORazepam  (ATIVAN ) tablet 1 mg  1 mg Oral TID PRN Cam Charlie Loving, DO   1 mg at 02/24/23 2139   magnesium  hydroxide (MILK OF MAGNESIA) suspension 30 mL  30 mL Oral Daily PRN Cam Charlie Loving, DO   30 mL at 03/04/23 1149    metFORMIN  (GLUCOPHAGE ) tablet 1,000 mg  1,000 mg Oral BH-q8a4p Cam Charlie Loving, DO   1,000 mg at 03/25/23 1008   ondansetron  (ZOFRAN ) tablet 4 mg  4 mg Oral Q4H PRN Victoria Ruts, MD   4 mg at 02/27/23 2016   oxybutynin  (DITROPAN ) tablet 5 mg  5 mg Oral BID Parmar, Meenakshi, MD   5 mg at 03/25/23 1008   paliperidone  (INVEGA  SUSTENNA) injection 156 mg  156 mg Intramuscular Q28 days Victoria Ruts, MD   156 mg at 03/12/23 1516   pantoprazole  (PROTONIX ) EC tablet 80 mg  80 mg Oral Daily Cam Charlie Loving, DO   80 mg at 03/25/23 1008   topiramate  (TOPAMAX ) tablet 50 mg  50 mg Oral BH-q8a4p Cam Charlie Loving, DO   50 mg at 03/25/23 1008   traZODone  (DESYREL ) tablet 50 mg  50 mg Oral QHS PRN Cam Charlie Loving, DO   50 mg at 03/24/23 2051   zonisamide  (ZONEGRAN ) capsule 100 mg  100 mg Oral QHS Cam Charlie Loving, DO   100 mg at 03/24/23 2051     Discharge Medications: Please see discharge summary for a list of discharge medications.  Relevant Imaging Results:  Relevant Lab Results:   Additional Information SSN #[id number]  Lum JONETTA Croft, LCSWA

## 2023-03-25 NOTE — BHH Counselor (Signed)
 CSW received return call from Nada Scrivener at [facility], [phone].   Anitha reports she gets 4,500 dollars from [facility] and would need that amount.   CSW will contact supervisors because Anitha reports that she can't take patient without that funding.   CSW will inform supervisor.    Lum Croft, MSW, [place] 03/25/2023 11:13 AM

## 2023-03-25 NOTE — Progress Notes (Signed)
 [facility] MD Progress Note  03/25/2023 1:13 PM Betty Henderson  MRN:  [id number] Subjective: Betty Henderson was seen on rounds.  There has been no changes in her status.  She has been compliant with medications.  Social work continues to work with DSS on placement.  She has been in good controls on the unit.  Nurses report no issues.  She has been pleasant and cooperative. Principal Problem: Schizoaffective disorder, bipolar type (HCC) Diagnosis: Principal Problem:   Schizoaffective disorder, bipolar type (HCC)  Total Time spent with patient: 15 minutes  Past Psychiatric History: Schizoaffective disorder  Past Medical History:  Past Medical History:  Diagnosis Date   Anemia    Arthritis    Chronic pain    Drug-seeking behavior    Malingering    Osteopetrosis    Psychosis (HCC)    Schizoaffective disorder, bipolar type (HCC)     Past Surgical History:  Procedure Laterality Date   MOUTH SURGERY     TUBAL LIGATION     Family History:  Family History  Family history unknown: Yes   Family Psychiatric  History: Unremarkable Social History:  Social History   Substance and Sexual Activity  Alcohol Use Not Currently   Comment: 1 cocktail 3 weeks ago     Social History   Substance and Sexual Activity  Drug Use Not Currently   Types: Cocaine   Comment: states it's legal though    Social History   Socioeconomic History   Marital status: Single    Spouse name: Not on file   Number of children: Not on file   Years of education: Not on file   Highest education level: Not on file  Occupational History   Not on file  Tobacco Use   Smoking status: Every Day    Current packs/day: 0.50    Types: Cigarettes   Smokeless tobacco: Never  Vaping Use   Vaping status: Never Used  Substance and Sexual Activity   Alcohol use: Not Currently    Comment: 1 cocktail 3 weeks ago   Drug use: Not Currently    Types: Cocaine    Comment: states it's legal though   Sexual activity: Never   Other Topics Concern   Not on file  Social History Narrative   Not on file   Social Drivers of Health   Financial Resource Strain: Not on file  Food Insecurity: Food Insecurity Present (08/22/2022)   Hunger Vital Sign    Worried About Running Out of Food in the Last Year: Sometimes true    Ran Out of Food in the Last Year: Sometimes true  Transportation Needs: Patient Unable To Answer (08/22/2022)   PRAPARE - Transportation    Lack of Transportation (Medical): Patient unable to answer    Lack of Transportation (Non-Medical): Patient unable to answer  Recent Concern: Transportation Needs - Unmet Transportation Needs (06/13/2022)   Received from [facility], [facility]   [facility] - Transportation    Lack of Transportation (Medical): Not on file    Lack of Transportation (Non-Medical): Yes  Physical Activity: Not on file  Stress: Not on file  Social Connections: Unknown (08/06/2021)   Received from [facility], [facility]   Social Network    Social Network: Not on file   Additional Social History:                         Sleep: Good  Appetite:  Good  Current Medications: Current Facility-Administered  Medications  Medication Dose Route Frequency Provider Last Rate Last Admin   acetaminophen  (TYLENOL ) tablet 650 mg  650 mg Oral Q6H PRN Cam Charlie Loving, DO   650 mg at 03/17/23 0957   alum & mag hydroxide-simeth (MAALOX/MYLANTA) 200-200-20 MG/5ML suspension 30 mL  30 mL Oral Q4H PRN Cam Charlie Loving, DO   30 mL at 02/28/23 0153   aspirin  EC tablet 81 mg  81 mg Oral Daily Laurita Manor T, MD   81 mg at 03/25/23 1008   atorvastatin  (LIPITOR) tablet 20 mg  20 mg Oral Daily Zhang, Ping T, MD   20 mg at 03/25/23 1008   diphenhydrAMINE  (BENADRYL ) capsule 50 mg  50 mg Oral Q6H PRN Cam Charlie Loving, DO   50 mg at 09/29/22 2254   Or   diphenhydrAMINE  (BENADRYL ) injection 50 mg  50 mg Intramuscular Q6H PRN Cam Charlie Loving, DO       glimepiride   (AMARYL ) tablet 8 mg  8 mg Oral Q breakfast Laurita Manor T, MD   8 mg at 03/25/23 1008   haloperidol  (HALDOL ) tablet 5 mg  5 mg Oral Q6H PRN Cam Charlie Loving, DO   5 mg at 09/28/22 2149   Or   haloperidol  lactate (HALDOL ) injection 5 mg  5 mg Intramuscular Q6H PRN Cam Charlie Loving, DO       ibuprofen  (ADVIL ) tablet 600 mg  600 mg Oral Q6H PRN [name], [name], MD   600 mg at 03/24/23 1652   linagliptin  (TRADJENTA ) tablet 5 mg  5 mg Oral Daily Laurita Manor T, MD   5 mg at 03/25/23 1007   LORazepam  (ATIVAN ) tablet 1 mg  1 mg Oral TID PRN Cam Charlie Loving, DO   1 mg at 02/24/23 2139   magnesium  hydroxide (MILK OF MAGNESIA) suspension 30 mL  30 mL Oral Daily PRN Cam Charlie Loving, DO   30 mL at 03/04/23 1149   metFORMIN  (GLUCOPHAGE ) tablet 1,000 mg  1,000 mg Oral [id number] Cam Charlie Loving, DO   1,000 mg at 03/25/23 1008   ondansetron  (ZOFRAN ) tablet 4 mg  4 mg Oral Q4H PRN Parmar, Meenakshi, MD   4 mg at 02/27/23 2016   oxybutynin  (DITROPAN ) tablet 5 mg  5 mg Oral BID Parmar, Meenakshi, MD   5 mg at 03/25/23 1008   paliperidone  (INVEGA  SUSTENNA) injection 156 mg  156 mg Intramuscular Q28 days Parmar, Meenakshi, MD   156 mg at 03/12/23 1516   pantoprazole  (PROTONIX ) EC tablet 80 mg  80 mg Oral Daily Cam Charlie Loving, DO   80 mg at 03/25/23 1008   topiramate  (TOPAMAX ) tablet 50 mg  50 mg Oral [id number] Cam Charlie Loving, DO   50 mg at 03/25/23 1008   traZODone  (DESYREL ) tablet 50 mg  50 mg Oral QHS PRN Cam Charlie Loving, DO   50 mg at 03/24/23 2051   zonisamide  (ZONEGRAN ) capsule 100 mg  100 mg Oral QHS Cam Charlie Loving, DO   100 mg at 03/24/23 2051    Lab Results: No results found for this or any previous visit (from the past 48 hours).  Blood Alcohol level:  Lab Results  Component Value Date   [facility] <10 08/17/2022   ETH <10 01/11/2021    Metabolic Disorder Labs: Lab Results  Component Value Date   HGBA1C 11.0 (H) 01/27/2023   MPG 269  01/27/2023   No results found for: PROLACTIN Lab Results  Component Value Date   CHOL 259 (H) 01/27/2023  TRIG 155 (H) 01/27/2023   HDL 65 01/27/2023   CHOLHDL 4.0 01/27/2023   VLDL 31 01/27/2023   LDLCALC 163 (H) 01/27/2023    Physical Findings: AIMS:  , ,  ,  ,    CIWA:    COWS:     Musculoskeletal: Strength & Muscle Tone: within normal limits Gait & Station: normal Patient leans: N/A  Psychiatric Specialty Exam:  Presentation  General Appearance:  Casual; Neat  Eye Contact: Fair  Speech: Slow; Slurred  Speech Volume: Decreased  Handedness: Right   Mood and Affect  Mood: Anxious  Affect: Inappropriate   Thought Process  Thought Processes: Disorganized  Descriptions of Associations:Loose  Orientation:Partial  Thought Content:Illogical; Rumination  History of Schizophrenia/Schizoaffective disorder:No data recorded Duration of Psychotic Symptoms:No data recorded Hallucinations:No data recorded Ideas of Reference:Paranoia  Suicidal Thoughts:No data recorded Homicidal Thoughts:No data recorded  Sensorium  Memory: Immediate Fair; Remote Poor  Judgment: Poor  Insight: Poor   Executive Functions  Concentration: Poor  Attention Span: Fair  Recall: [employer] of Knowledge: Fair  Language: Fair   Psychomotor Activity  Psychomotor Activity:No data recorded  Assets  Assets: Communication Skills   Sleep  Sleep:No data recorded    Blood pressure 116/65, pulse 75, temperature 98.1 F (36.7 C), resp. rate 20, height 5' 6 (1.676 m), weight 86 kg, SpO2 98%. Body mass index is 30.59 kg/m.   Treatment Plan Summary: Daily contact with patient to assess and evaluate symptoms and progress in treatment, Medication management, and Plan continue current medications.  Charlie Dallas Salines, DO 03/25/2023, 1:13 PM

## 2023-03-25 NOTE — Plan of Care (Signed)
  Problem: Education: Goal: Knowledge of General Education information will improve Description: Including pain rating scale, medication(s)/side effects and non-pharmacologic comfort measures Outcome: Progressing   Problem: Health Behavior/Discharge Planning: Goal: Ability to manage health-related needs will improve Outcome: Progressing   Problem: Clinical Measurements: Goal: Ability to maintain clinical measurements within normal limits will improve Outcome: Progressing Goal: Diagnostic test results will improve Outcome: Progressing   Problem: Nutrition: Goal: Adequate nutrition will be maintained Outcome: Progressing   Problem: Coping: Goal: Level of anxiety will decrease Outcome: Progressing   Problem: Elimination: Goal: Will not experience complications related to bowel motility Outcome: Progressing Goal: Will not experience complications related to urinary retention Outcome: Progressing   Problem: Pain Managment: Goal: General experience of comfort will improve Outcome: Progressing   Problem: Safety: Goal: Ability to remain free from injury will improve Outcome: Progressing   Problem: Skin Integrity: Goal: Risk for impaired skin integrity will decrease Outcome: Progressing

## 2023-03-25 NOTE — BHH Counselor (Signed)
CSW contacted Malvin Johns at [phone] at [facility] suggestion of CSW [employer].   CSW unable to reach, left HIPAA compliant VM requesting return call.   Reynaldo Minium, MSW, [place] 03/25/2023 10:10 AM

## 2023-03-25 NOTE — BHH Counselor (Signed)
CSW contacted Traci Sermon with [facility].   She reports she has one availability in her independent living facility. She reports CSW can fax pt's info to F: [phone]  Reynaldo Minium, MSW, [facility] 03/25/2023 11:01 AM

## 2023-03-26 ENCOUNTER — Inpatient Hospital Stay: Payer: MEDICAID

## 2023-03-26 DIAGNOSIS — F250 Schizoaffective disorder, bipolar type: Secondary | ICD-10-CM | POA: Diagnosis not present

## 2023-03-26 MED ORDER — GLIMEPIRIDE 4 MG PO TABS
8.0000 mg | ORAL_TABLET | Freq: Every day | ORAL | 0 refills | Status: AC
  Filled 2023-03-26: qty 10, 5d supply, fill #0

## 2023-03-26 MED ORDER — GLIMEPIRIDE 4 MG PO TABS: 8.0000 mg | ORAL_TABLET | Freq: Every day | ORAL | 3 refills | Status: DC

## 2023-03-26 MED ORDER — ZONISAMIDE 100 MG PO CAPS: 100.0000 mg | ORAL_CAPSULE | Freq: Every day | ORAL | 3 refills | Status: DC

## 2023-03-26 MED ORDER — TRAZODONE HCL 50 MG PO TABS
50.0000 mg | ORAL_TABLET | Freq: Every day | ORAL | Status: DC
  Administered 2023-03-26 – 2023-03-27 (×2): 50 mg via ORAL
  Filled 2023-03-26 (×2): qty 1

## 2023-03-26 MED ORDER — LINAGLIPTIN 5 MG PO TABS: 5.0000 mg | ORAL_TABLET | Freq: Every day | ORAL | 3 refills | Status: DC

## 2023-03-26 MED ORDER — ATORVASTATIN CALCIUM 20 MG PO TABS: 20.0000 mg | ORAL_TABLET | Freq: Every day | ORAL | 3 refills | Status: DC

## 2023-03-26 MED ORDER — OXYBUTYNIN CHLORIDE 5 MG PO TABS
5.0000 mg | ORAL_TABLET | Freq: Two times a day (BID) | ORAL | 0 refills | Status: AC
  Filled 2023-03-26: qty 10, 5d supply, fill #0

## 2023-03-26 MED ORDER — LINAGLIPTIN 5 MG PO TABS
5.0000 mg | ORAL_TABLET | Freq: Every day | ORAL | 0 refills | Status: AC
  Filled 2023-03-26 – 2023-03-27 (×2): qty 5, 5d supply, fill #0

## 2023-03-26 MED ORDER — METFORMIN HCL 1000 MG PO TABS: 1000.0000 mg | ORAL_TABLET | ORAL | 3 refills | Status: DC

## 2023-03-26 MED ORDER — TRAZODONE HCL 50 MG PO TABS: 50.0000 mg | ORAL_TABLET | Freq: Every day | ORAL | 3 refills | Status: DC

## 2023-03-26 MED ORDER — PALIPERIDONE PALMITATE ER 156 MG/ML IM SUSY: 156.0000 mg | PREFILLED_SYRINGE | INTRAMUSCULAR | 5 refills | Status: AC

## 2023-03-26 MED ORDER — ZONISAMIDE 100 MG PO CAPS
100.0000 mg | ORAL_CAPSULE | Freq: Every day | ORAL | 0 refills | Status: AC
  Filled 2023-03-26: qty 5, 5d supply, fill #0

## 2023-03-26 MED ORDER — OXYBUTYNIN CHLORIDE 5 MG PO TABS: 5.0000 mg | ORAL_TABLET | Freq: Two times a day (BID) | ORAL | 3 refills | Status: DC

## 2023-03-26 MED ORDER — TRAZODONE HCL 50 MG PO TABS
50.0000 mg | ORAL_TABLET | Freq: Every day | ORAL | 0 refills | Status: AC
  Filled 2023-03-26: qty 5, 5d supply, fill #0

## 2023-03-26 MED ORDER — ATORVASTATIN CALCIUM 20 MG PO TABS
20.0000 mg | ORAL_TABLET | Freq: Every day | ORAL | 0 refills | Status: AC
  Filled 2023-03-26: qty 5, 5d supply, fill #0

## 2023-03-26 MED ORDER — ASPIRIN 81 MG PO TBEC
81.0000 mg | DELAYED_RELEASE_TABLET | Freq: Every day | ORAL | 0 refills | Status: AC
  Filled 2023-03-26: qty 5, 5d supply, fill #0

## 2023-03-26 MED ORDER — ASPIRIN 81 MG PO TBEC: 81.0000 mg | DELAYED_RELEASE_TABLET | Freq: Every day | ORAL | 12 refills | Status: DC

## 2023-03-26 MED ORDER — TOPIRAMATE 50 MG PO TABS: 50.0000 mg | ORAL_TABLET | ORAL | 3 refills | Status: DC

## 2023-03-26 MED ORDER — PANTOPRAZOLE SODIUM 40 MG PO TBEC
80.0000 mg | DELAYED_RELEASE_TABLET | Freq: Every day | ORAL | 0 refills | Status: AC
  Filled 2023-03-26: qty 10, 5d supply, fill #0

## 2023-03-26 MED ORDER — TOPIRAMATE 50 MG PO TABS
50.0000 mg | ORAL_TABLET | ORAL | 0 refills | Status: AC
  Filled 2023-03-26: qty 10, 5d supply, fill #0

## 2023-03-26 MED ORDER — METFORMIN HCL 1000 MG PO TABS
1000.0000 mg | ORAL_TABLET | ORAL | 0 refills | Status: AC
  Filled 2023-03-26: qty 10, 5d supply, fill #0

## 2023-03-26 MED ORDER — PANTOPRAZOLE SODIUM 40 MG PO TBEC: 80.0000 mg | DELAYED_RELEASE_TABLET | Freq: Every day | ORAL | 3 refills | Status: DC

## 2023-03-26 NOTE — Group Note (Signed)
 Date:  03/26/2023 Time:  10:51 AM  Group Topic/Focus:  Making Healthy Choices:   The focus of this group is to help patients identify negative/unhealthy choices they were using prior to admission and identify positive/healthier coping strategies to replace them upon discharge.    Participation Level:  Did Not Attend   Betty Henderson 03/26/2023, 10:51 AM

## 2023-03-26 NOTE — Progress Notes (Signed)
   03/26/23 1019  Psych Admission Type (Psych Patients Only)  Admission Status Voluntary  Psychosocial Assessment  Patient Complaints None  Eye Contact Fair  Facial Expression Flat  Affect Flat  Speech Logical/coherent  Interaction Minimal  Motor Activity Slow  Appearance/Hygiene Disheveled;Layered clothes  Behavior Characteristics Calm  Mood Pleasant  Thought Process  Coherency WDL  Content WDL  Delusions None reported or observed  Perception WDL  Hallucination None reported or observed  Judgment Impaired  Confusion None  Danger to Self  Current suicidal ideation? Denies  Agreement Not to Harm Self Yes  Description of Agreement verbal  Danger to Others  Danger to Others None reported or observed  Danger to Others Abnormal  Harmful Behavior to others No threats or harm toward other people  Destructive Behavior No threats or harm toward property   CXR done as ordered. Contacted lab in reference to quantiferon ordered to be performed at 0500 today. Lab assistant stated that the test is usually not done on a holiday or weekend but stated that it will done early 03/27/23.

## 2023-03-26 NOTE — Plan of Care (Addendum)
 Pt is alert and oriented. Pt received in bed. RR even and unlabored. Flat affect. Reports possible discharge on Friday. States she's ready to leave and smoke a cigarette. Support and encouragement provided. Po med given as scheduled. Tol well. No behavior issues noted. Q 15 min checks maintained for safety. No c/o pain/discomfort noted.   Problem: Education: Goal: Knowledge of General Education information will improve Description: Including pain rating scale, medication(s)/side effects and non-pharmacologic comfort measures Outcome: Progressing   Problem: Coping: Goal: Level of anxiety will decrease Outcome: Progressing   Problem: Elimination: Goal: Will not experience complications related to bowel motility Outcome: Progressing   Problem: Pain Managment: Goal: General experience of comfort will improve Outcome: Progressing   Problem: Safety: Goal: Ability to remain free from injury will improve Outcome: Progressing   Problem: Skin Integrity: Goal: Risk for impaired skin integrity will decrease Outcome: Progressing

## 2023-03-26 NOTE — Evaluation (Signed)
 Physical Therapy Evaluation Patient Details Name: Jacqulene Huntley MRN: [id number] DOB: 09-21-1960 Today's Date: 03/26/2023  History of Present Illness  Pt is a 63 yo female admitted to geripsych. PMH of schizoaffective disorder, bipolar, arthritis, psychosis.  Clinical Impression  Pt alert, oriented, pleasant upon PT arrival, seated in dayroom. Pt stated she started using a RW while she was here and has been using it for a long time more than a month per pt but unable to truly quantify. Per staff pt also recently in the last month had a fall in the bathroom. She demonstrated 4/5 BLE strength and scored a 20 on the DGI. Pt endorsed comfort with RW, and PT/pt in agreement of continued use especially with transitioning to new environment soon (discharge to ILF per chart). Would recommend follow up physical therapy to address falls risk as well as progress pt potentially to LRAD.           If plan is discharge home, recommend the following: Assist for transportation;Help with stairs or ramp for entrance   Can travel by private vehicle        Equipment Recommendations Rolling walker (2 wheels)  Recommendations for Other Services       Functional Status Assessment Patient has had a recent decline in their functional status and demonstrates the ability to make significant improvements in function in a reasonable and predictable amount of time.     Precautions / Restrictions Precautions Precautions: Fall Restrictions Weight Bearing Restrictions Per Provider Order: No      Mobility  Bed Mobility               General bed mobility comments: pt up in dayroom upon PT arrival    Transfers Overall transfer level: Modified independent Equipment used: Rolling walker (2 wheels)                    Ambulation/Gait Ambulation/Gait assistance: Contact guard assist, Modified independent (Device/Increase time) Gait Distance (Feet): 300 Feet Assistive device: Rolling walker  (2 wheels), None            Stairs            Wheelchair Mobility     Tilt Bed    Modified Rankin (Stroke Patients Only)       Balance Overall balance assessment: Modified Independent, Needs assistance Sitting-balance support: Feet supported Sitting balance-Leahy Scale: Normal     Standing balance support: During functional activity Standing balance-Leahy Scale: Good                   Standardized Balance Assessment Standardized Balance Assessment : Dynamic Gait Index   Dynamic Gait Index Level Surface: Normal Change in Gait Speed: Normal Gait with Horizontal Head Turns: Mild Impairment Gait with Vertical Head Turns: Mild Impairment Gait and Pivot Turn: Normal Step Over Obstacle: Mild Impairment Step Around Obstacles: Normal Steps: Mild Impairment (clinical judgement) Total Score: 20       Pertinent Vitals/Pain Pain Assessment Pain Assessment: No/denies pain    Home Living                     Additional Comments: per chart review pt is transitioning to ILF this week    Prior Function                       Extremity/Trunk Assessment   Upper Extremity Assessment Upper Extremity Assessment: Overall WFL for tasks assessed    Lower Extremity  Assessment Lower Extremity Assessment:  (4/5 bilaterally)       Communication      Cognition Arousal: Alert Behavior During Therapy: WFL for tasks assessed/performed Overall Cognitive Status: Within Functional Limits for tasks assessed                                          General Comments      Exercises     Assessment/Plan    PT Assessment Patient needs continued PT services  PT Problem List Decreased activity tolerance;Decreased balance;Decreased mobility       PT Treatment Interventions DME instruction;Balance training;Gait training;Neuromuscular re-education;Stair training;Functional mobility training;Patient/family education;Therapeutic  activities;Therapeutic exercise    PT Goals (Current goals can be found in the Care Plan section)  Acute Rehab PT Goals Patient Stated Goal: to get stronger PT Goal Formulation: With patient Time For Goal Achievement: 04/09/23 Potential to Achieve Goals: Good    Frequency Min 1X/week     Co-evaluation               AM-PAC PT 6 Clicks Mobility  Outcome Measure Help needed turning from your back to your side while in a flat bed without using bedrails?: None Help needed moving from lying on your back to sitting on the side of a flat bed without using bedrails?: None Help needed moving to and from a bed to a chair (including a wheelchair)?: None Help needed standing up from a chair using your arms (e.g., wheelchair or bedside chair)?: None Help needed to walk in hospital room?: None Help needed climbing 3-5 steps with a railing? : None 6 Click Score: 24    End of Session Equipment Utilized During Treatment: Gait belt Activity Tolerance: Patient tolerated treatment well Patient left: in chair Nurse Communication: Mobility status PT Visit Diagnosis: Difficulty in walking, not elsewhere classified (R26.2);Muscle weakness (generalized) (M62.81)    Time: 8873-8863 PT Time Calculation (min) (ACUTE ONLY): 10 min   Charges:   PT Evaluation $PT Eval Low Complexity: 1 Low   PT General Charges $$ ACUTE PT VISIT: 1 Visit       Doyal Shams PT, DPT 1:15 PM,03/26/23

## 2023-03-26 NOTE — Progress Notes (Signed)
 [facility] MD Progress Note  03/26/2023 10:23 AM Betty Henderson  MRN:  [id number] Subjective: Betty Henderson is seen on rounds.  She has been compliant with medications and no side effects.  She has been accepted to an assisted living facility for Friday.  I have filled out all of the necessary paperwork including her prescriptions.  She will need a TB test and a chest x-ray which has been ordered.  She has been in good controls.  Nurses report no issues.  She is doing well. Principal Problem: Schizoaffective disorder, bipolar type (HCC) Diagnosis: Principal Problem:   Schizoaffective disorder, bipolar type (HCC)  Total Time spent with patient: 15 minutes  Past Psychiatric History: Schizoaffective disorder  Past Medical History:  Past Medical History:  Diagnosis Date   Anemia    Arthritis    Chronic pain    Drug-seeking behavior    Malingering    Osteopetrosis    Psychosis (HCC)    Schizoaffective disorder, bipolar type (HCC)     Past Surgical History:  Procedure Laterality Date   MOUTH SURGERY     TUBAL LIGATION     Family History:  Family History  Family history unknown: Yes   Family Psychiatric  History: Unremarkable Social History:  Social History   Substance and Sexual Activity  Alcohol Use Not Currently   Comment: 1 cocktail 3 weeks ago     Social History   Substance and Sexual Activity  Drug Use Not Currently   Types: Cocaine   Comment: states it's legal though    Social History   Socioeconomic History   Marital status: Single    Spouse name: Not on file   Number of children: Not on file   Years of education: Not on file   Highest education level: Not on file  Occupational History   Not on file  Tobacco Use   Smoking status: Every Day    Current packs/day: 0.50    Types: Cigarettes   Smokeless tobacco: Never  Vaping Use   Vaping status: Never Used  Substance and Sexual Activity   Alcohol use: Not Currently    Comment: 1 cocktail 3 weeks ago   Drug use:  Not Currently    Types: Cocaine    Comment: states it's legal though   Sexual activity: Never  Other Topics Concern   Not on file  Social History Narrative   Not on file   Social Drivers of Health   Financial Resource Strain: Not on file  Food Insecurity: Food Insecurity Present (08/22/2022)   Hunger Vital Sign    Worried About Running Out of Food in the Last Year: Sometimes true    Ran Out of Food in the Last Year: Sometimes true  Transportation Needs: Patient Unable To Answer (08/22/2022)   PRAPARE - Transportation    Lack of Transportation (Medical): Patient unable to answer    Lack of Transportation (Non-Medical): Patient unable to answer  Recent Concern: Transportation Needs - Unmet Transportation Needs (06/13/2022)   Received from [facility], [facility]   [facility] - Transportation    Lack of Transportation (Medical): Not on file    Lack of Transportation (Non-Medical): Yes  Physical Activity: Not on file  Stress: Not on file  Social Connections: Unknown (08/06/2021)   Received from [facility], [facility]   Social Network    Social Network: Not on file   Additional Social History:  Sleep: Good  Appetite:  Good  Current Medications: Current Facility-Administered Medications  Medication Dose Route Frequency Provider Last Rate Last Admin   acetaminophen  (TYLENOL ) tablet 650 mg  650 mg Oral Q6H PRN Cam Charlie Loving, DO   650 mg at 03/17/23 0957   alum & mag hydroxide-simeth (MAALOX/MYLANTA) 200-200-20 MG/5ML suspension 30 mL  30 mL Oral Q4H PRN Cam Charlie Loving, DO   30 mL at 02/28/23 0153   aspirin  EC tablet 81 mg  81 mg Oral Daily Laurita Manor T, MD   81 mg at 03/26/23 9147   atorvastatin  (LIPITOR) tablet 20 mg  20 mg Oral Daily Laurita Manor T, MD   20 mg at 03/26/23 9147   diphenhydrAMINE  (BENADRYL ) capsule 50 mg  50 mg Oral Q6H PRN Cam Charlie Loving, DO   50 mg at 09/29/22 2254   Or   diphenhydrAMINE   (BENADRYL ) injection 50 mg  50 mg Intramuscular Q6H PRN Cam Charlie Loving, DO       glimepiride  (AMARYL ) tablet 8 mg  8 mg Oral Q breakfast Laurita Manor T, MD   8 mg at 03/26/23 9147   haloperidol  (HALDOL ) tablet 5 mg  5 mg Oral Q6H PRN Cam Charlie Loving, DO   5 mg at 09/28/22 2149   Or   haloperidol  lactate (HALDOL ) injection 5 mg  5 mg Intramuscular Q6H PRN Cam Charlie Loving, DO       ibuprofen  (ADVIL ) tablet 600 mg  600 mg Oral Q6H PRN [name], [name], MD   600 mg at 03/24/23 1652   linagliptin  (TRADJENTA ) tablet 5 mg  5 mg Oral Daily Laurita Manor T, MD   5 mg at 03/26/23 9147   LORazepam  (ATIVAN ) tablet 1 mg  1 mg Oral TID PRN Cam Charlie Loving, DO   1 mg at 02/24/23 2139   magnesium  hydroxide (MILK OF MAGNESIA) suspension 30 mL  30 mL Oral Daily PRN Cam Charlie Loving, DO   30 mL at 03/04/23 1149   metFORMIN  (GLUCOPHAGE ) tablet 1,000 mg  1,000 mg Oral [id number] Cam Charlie Loving, DO   1,000 mg at 03/26/23 9146   ondansetron  (ZOFRAN ) tablet 4 mg  4 mg Oral Q4H PRN Parmar, Meenakshi, MD   4 mg at 02/27/23 2016   oxybutynin  (DITROPAN ) tablet 5 mg  5 mg Oral BID Parmar, Meenakshi, MD   5 mg at 03/26/23 9146   paliperidone  (INVEGA  SUSTENNA) injection 156 mg  156 mg Intramuscular Q28 days Parmar, Meenakshi, MD   156 mg at 03/12/23 1516   pantoprazole  (PROTONIX ) EC tablet 80 mg  80 mg Oral Daily Cam Charlie Loving, DO   80 mg at 03/26/23 9147   topiramate  (TOPAMAX ) tablet 50 mg  50 mg Oral [id number] Cam Charlie Loving, DO   50 mg at 03/26/23 9147   traZODone  (DESYREL ) tablet 50 mg  50 mg Oral QHS Cam Charlie Loving, DO       zonisamide  (ZONEGRAN ) capsule 100 mg  100 mg Oral QHS Cam Charlie Loving, DO   100 mg at 03/25/23 2128    Lab Results: No results found for this or any previous visit (from the past 48 hours).  Blood Alcohol level:  Lab Results  Component Value Date   [employer] <10 08/17/2022   ETH <10 01/11/2021    Metabolic Disorder  Labs: Lab Results  Component Value Date   HGBA1C 11.0 (H) 01/27/2023   MPG 269 01/27/2023   No results found for: PROLACTIN Lab Results  Component Value Date  CHOL 259 (H) 01/27/2023   TRIG 155 (H) 01/27/2023   HDL 65 01/27/2023   CHOLHDL 4.0 01/27/2023   VLDL 31 01/27/2023   LDLCALC 163 (H) 01/27/2023    Physical Findings: AIMS:  , ,  ,  ,    CIWA:    COWS:     Musculoskeletal: Strength & Muscle Tone: within normal limits Gait & Station: normal Patient leans: N/A  Psychiatric Specialty Exam:  Presentation  General Appearance:  Casual; Neat  Eye Contact: Fair  Speech: Slow; Slurred  Speech Volume: Decreased  Handedness: Right   Mood and Affect  Mood: Anxious  Affect: Inappropriate   Thought Process  Thought Processes: Disorganized  Descriptions of Associations:Loose  Orientation:Partial  Thought Content:Illogical; Rumination  History of Schizophrenia/Schizoaffective disorder:No data recorded Duration of Psychotic Symptoms:No data recorded Hallucinations:No data recorded Ideas of Reference:Paranoia  Suicidal Thoughts:No data recorded Homicidal Thoughts:No data recorded  Sensorium  Memory: Immediate Fair; Remote Poor  Judgment: Poor  Insight: Poor   Executive Functions  Concentration: Poor  Attention Span: Fair  Recall: [employer] of Knowledge: Fair  Language: Fair   Psychomotor Activity  Psychomotor Activity:No data recorded  Assets  Assets: Communication Skills   Sleep  Sleep:No data recorded    Blood pressure (!) 97/57, pulse 62, temperature (!) 97.3 F (36.3 C), resp. rate 18, height 5' 6 (1.676 m), weight 86 kg, SpO2 97%. Body mass index is 30.59 kg/m.   Treatment Plan Summary: Daily contact with patient to assess and evaluate symptoms and progress in treatment, Medication management, and Plan continue current medications.  PT evaluation for discharge on Friday to assisted living.  Chest  x-ray to rule out TB.  Charlie Dallas Salines, DO 03/26/2023, 10:23 AM

## 2023-03-27 ENCOUNTER — Other Ambulatory Visit: Payer: Self-pay

## 2023-03-27 DIAGNOSIS — F250 Schizoaffective disorder, bipolar type: Secondary | ICD-10-CM | POA: Diagnosis not present

## 2023-03-27 NOTE — BHH Counselor (Addendum)
 ADDENDUM:  Current plan is for pt to go to [facility] tomorrow after 3PM. If pt chooses not to go, she will go with Mr.Dave Humphries on Monday.     CSW contacted Filiberto Sharps, casweorker at [facility] [phone]  CSW informed Camillie that [name] would be going to [facility] group home.   Camillie reports concerns due to issues that [facility] DSS has had with An[facility] in the past.   CSW will inform pt that she has the choice whether or not she would like to go with Ms. Anitha or Mr.Dave Humphries.  Lum Croft, MSW, [place] 03/27/2023 4:16 PM

## 2023-03-27 NOTE — Group Note (Signed)
 Date:  03/26/23 Time:  09:00 pm  Group Topic/Focus:  Goals Group:   The focus of this group is to help patients establish daily goals to achieve during treatment and discuss how the patient can incorporate goal setting into their daily lives to aide in recovery.    Participation Level:  Active  Participation Quality:  Appropriate  Affect:  Appropriate  Cognitive:  Appropriate  Insight: Appropriate  Engagement in Group:  Engaged  Modes of Intervention:  Discussion  Additional Comments:    Laymon ONEIDA Finder 03/26/23, 09:00 pm

## 2023-03-27 NOTE — Group Note (Signed)
 Recreation Therapy Group Note   Group Topic:Other  Group Date: 03/27/2023 Start Time: 1400 End Time: 1450 Facilitators: Celestia Jeoffrey BRAVO, LRT, CTRS Location:  Dayroom  Group Description: Seated Exercise. LRT discussed the mental and physical benefits of exercise. LRT and group discussed how physical activity can be used as a coping skill. Pt's and LRT followed along to an exercise video on the TV screen that provided a visual representation and audio description of every exercise performed. Pt's encouraged to listen to their bodies and stop at any time if they experience feelings of discomfort or pain. Pts were encouraged to drink water  and stay hydrated.   Goal Area(s) Addressed: Patient will learn benefits of physical activity. Patient will identify exercise as a coping skill.  Patient will follow multistep directions. Patient will try a new leisure interest.    Affect/Mood: Appropriate   Participation Level: Minimal   Participation Quality: Independent   Behavior: Alert and Calm   Speech/Thought Process: Coherent   Insight: Fair   Judgement: Fair    Modes of Intervention: Activity, Education, Exploration, and Guided Discussion   Patient Response to Interventions:  Receptive   Education Outcome:  In group clarification offered    Clinical Observations/Individualized Feedback: Betty Henderson was minimally active in their participation of session activities and group discussion. Pt completed some of the lower body exercises, but none of the upper body. Pt interacted well with LRT and peers duration of session.    Plan: Continue to engage patient in RT group sessions 2-3x/week.   Jeoffrey BRAVO Celestia, LRT, CTRS 03/27/2023 4:58 PM

## 2023-03-27 NOTE — Progress Notes (Signed)
 [facility] MD Progress Note  03/27/2023 1:56 PM Betty Henderson  MRN:  [id number] Subjective: Betty Henderson is seen on rounds.  She has no complaints.  She has been compliant with medications.  No side effects.  She has been accepted to assisted living on Friday.  All of her orders are in.  She says that she is excited about going and having a cigarette.  She has been in good controls. Principal Problem: Schizoaffective disorder, bipolar type (HCC) Diagnosis: Principal Problem:   Schizoaffective disorder, bipolar type (HCC)  Total Time spent with patient: 15 minutes  Past Psychiatric History: Schizoaffective disorder  Past Medical History:  Past Medical History:  Diagnosis Date   Anemia    Arthritis    Chronic pain    Drug-seeking behavior    Malingering    Osteopetrosis    Psychosis (HCC)    Schizoaffective disorder, bipolar type (HCC)     Past Surgical History:  Procedure Laterality Date   MOUTH SURGERY     TUBAL LIGATION     Family History:  Family History  Family history unknown: Yes   Family Psychiatric  History: Unremarkable Social History:  Social History   Substance and Sexual Activity  Alcohol Use Not Currently   Comment: 1 cocktail 3 weeks ago     Social History   Substance and Sexual Activity  Drug Use Not Currently   Types: Cocaine   Comment: states it's legal though    Social History   Socioeconomic History   Marital status: Single    Spouse name: Not on file   Number of children: Not on file   Years of education: Not on file   Highest education level: Not on file  Occupational History   Not on file  Tobacco Use   Smoking status: Every Day    Current packs/day: 0.50    Types: Cigarettes   Smokeless tobacco: Never  Vaping Use   Vaping status: Never Used  Substance and Sexual Activity   Alcohol use: Not Currently    Comment: 1 cocktail 3 weeks ago   Drug use: Not Currently    Types: Cocaine    Comment: states it's legal though   Sexual  activity: Never  Other Topics Concern   Not on file  Social History Narrative   Not on file   Social Drivers of Health   Financial Resource Strain: Not on file  Food Insecurity: Food Insecurity Present (08/22/2022)   Hunger Vital Sign    Worried About Running Out of Food in the Last Year: Sometimes true    Ran Out of Food in the Last Year: Sometimes true  Transportation Needs: Patient Unable To Answer (08/22/2022)   PRAPARE - Transportation    Lack of Transportation (Medical): Patient unable to answer    Lack of Transportation (Non-Medical): Patient unable to answer  Recent Concern: Transportation Needs - Unmet Transportation Needs (06/13/2022)   Received from [facility], [facility]   [facility] - Transportation    Lack of Transportation (Medical): Not on file    Lack of Transportation (Non-Medical): Yes  Physical Activity: Not on file  Stress: Not on file  Social Connections: Unknown (08/06/2021)   Received from [facility], [facility]   Social Network    Social Network: Not on file   Additional Social History:                         Sleep: Good  Appetite:  Good  Current Medications: Current Facility-Administered Medications  Medication Dose Route Frequency Provider Last Rate Last Admin   acetaminophen  (TYLENOL ) tablet 650 mg  650 mg Oral Q6H PRN Cam Charlie Loving, DO   650 mg at 03/17/23 0957   alum & mag hydroxide-simeth (MAALOX/MYLANTA) 200-200-20 MG/5ML suspension 30 mL  30 mL Oral Q4H PRN Cam Charlie Loving, DO   30 mL at 02/28/23 0153   aspirin  EC tablet 81 mg  81 mg Oral Daily Laurita Manor T, MD   81 mg at 03/27/23 9077   atorvastatin  (LIPITOR) tablet 20 mg  20 mg Oral Daily Laurita Manor T, MD   20 mg at 03/27/23 9077   diphenhydrAMINE  (BENADRYL ) capsule 50 mg  50 mg Oral Q6H PRN Cam Charlie Loving, DO   50 mg at 09/29/22 2254   Or   diphenhydrAMINE  (BENADRYL ) injection 50 mg  50 mg Intramuscular Q6H PRN Cam Charlie Loving, DO        glimepiride  (AMARYL ) tablet 8 mg  8 mg Oral Q breakfast Laurita Manor T, MD   8 mg at 03/27/23 9077   haloperidol  (HALDOL ) tablet 5 mg  5 mg Oral Q6H PRN Cam Charlie Loving, DO   5 mg at 09/28/22 2149   Or   haloperidol  lactate (HALDOL ) injection 5 mg  5 mg Intramuscular Q6H PRN Cam Charlie Loving, DO       ibuprofen  (ADVIL ) tablet 600 mg  600 mg Oral Q6H PRN [name], [name], MD   600 mg at 03/24/23 1652   linagliptin  (TRADJENTA ) tablet 5 mg  5 mg Oral Daily Laurita Manor T, MD   5 mg at 03/27/23 9077   LORazepam  (ATIVAN ) tablet 1 mg  1 mg Oral TID PRN Cam Charlie Loving, DO   1 mg at 02/24/23 2139   magnesium  hydroxide (MILK OF MAGNESIA) suspension 30 mL  30 mL Oral Daily PRN Cam Charlie Loving, DO   30 mL at 03/04/23 1149   metFORMIN  (GLUCOPHAGE ) tablet 1,000 mg  1,000 mg Oral [id number] Cam Charlie Loving, DO   1,000 mg at 03/27/23 9077   ondansetron  (ZOFRAN ) tablet 4 mg  4 mg Oral Q4H PRN Parmar, Meenakshi, MD   4 mg at 02/27/23 2016   oxybutynin  (DITROPAN ) tablet 5 mg  5 mg Oral BID Parmar, Meenakshi, MD   5 mg at 03/27/23 9077   paliperidone  (INVEGA  SUSTENNA) injection 156 mg  156 mg Intramuscular Q28 days Parmar, Meenakshi, MD   156 mg at 03/12/23 1516   pantoprazole  (PROTONIX ) EC tablet 80 mg  80 mg Oral Daily Cam Charlie Loving, DO   80 mg at 03/27/23 9077   topiramate  (TOPAMAX ) tablet 50 mg  50 mg Oral [id number] Cam Charlie Loving, DO   50 mg at 03/27/23 9077   traZODone  (DESYREL ) tablet 50 mg  50 mg Oral QHS Cam Charlie Loving, DO   50 mg at 03/26/23 2052   zonisamide  (ZONEGRAN ) capsule 100 mg  100 mg Oral QHS Cam Charlie Loving, DO   100 mg at 03/26/23 2052    Lab Results: No results found for this or any previous visit (from the past 48 hours).  Blood Alcohol level:  Lab Results  Component Value Date   [facility] <10 08/17/2022   ETH <10 01/11/2021    Metabolic Disorder Labs: Lab Results  Component Value Date   HGBA1C 11.0 (H) 01/27/2023    MPG 269 01/27/2023   No results found for: PROLACTIN Lab Results  Component Value Date   CHOL 259 (  H) 01/27/2023   TRIG 155 (H) 01/27/2023   HDL 65 01/27/2023   CHOLHDL 4.0 01/27/2023   VLDL 31 01/27/2023   LDLCALC 163 (H) 01/27/2023    Physical Findings: AIMS:  , ,  ,  ,    CIWA:    COWS:     Musculoskeletal: Strength & Muscle Tone: within normal limits Gait & Station: normal Patient leans: N/A  Psychiatric Specialty Exam:  Presentation  General Appearance:  Casual; Neat  Eye Contact: Fair  Speech: Slow; Slurred  Speech Volume: Decreased  Handedness: Right   Mood and Affect  Mood: Anxious  Affect: Inappropriate   Thought Process  Thought Processes: Disorganized  Descriptions of Associations:Loose  Orientation:Partial  Thought Content:Illogical; Rumination  History of Schizophrenia/Schizoaffective disorder:No data recorded Duration of Psychotic Symptoms:No data recorded Hallucinations:No data recorded Ideas of Reference:Paranoia  Suicidal Thoughts:No data recorded Homicidal Thoughts:No data recorded  Sensorium  Memory: Immediate Fair; Remote Poor  Judgment: Poor  Insight: Poor   Executive Functions  Concentration: Poor  Attention Span: Fair  Recall: [employer] of Knowledge: Fair  Language: Fair   Psychomotor Activity  Psychomotor Activity:No data recorded  Assets  Assets: Communication Skills   Sleep  Sleep:No data recorded   Blood pressure 113/76, pulse 82, temperature 97.9 F (36.6 C), resp. rate 18, height 5' 6 (1.676 m), weight 86 kg, SpO2 100%. Body mass index is 30.59 kg/m.   Treatment Plan Summary: Daily contact with patient to assess and evaluate symptoms and progress in treatment, Medication management, and Plan continue current medications.  Charlie Dallas Salines, DO 03/27/2023, 1:56 PM

## 2023-03-27 NOTE — BHH Counselor (Signed)
 CSW faxed requested paperwork to [place] at [phone].   Paperwork included pt's facesheet, FL2, driver's license, and TB Xray results.   Reynaldo Minium, MSW, [place] 03/27/2023 12:37 PM

## 2023-03-27 NOTE — Plan of Care (Signed)

## 2023-03-27 NOTE — Plan of Care (Signed)
 D: Pt alert and oriented. Pt denies experiencing any anxiety/depression at this time. Pt denies experiencing any pain at this time. Pt denies experiencing any SI/HI, or AVH at this time.   A: Scheduled medications administered to pt, per MD orders. Support and encouragement provided. Frequent verbal contact made. Routine safety checks conducted q15 minutes.   R: No adverse drug reactions noted. Pt verbally contracts for safety at this time. Pt compliant with medications and treatment plan. Pt interacts well but minimally with others on the unit. Pt remains safe at this time. Plan of care ongoing.  Pt voices excitement in regards to discharging tomorrow.   Problem: Education: Goal: Knowledge of General Education information will improve Description: Including pain rating scale, medication(s)/side effects and non-pharmacologic comfort measures Outcome: Progressing   Problem: Nutrition: Goal: Adequate nutrition will be maintained Outcome: Progressing

## 2023-03-27 NOTE — BHH Counselor (Signed)
 CSW contacted Anitha Kurian at [phone].   Anitha confirms that pt can come to [facility], at 3:00 PM at [address] [place], [place].   Anitha reports she needs the hospital address to send pharmacy bills to.   CSW provided hospital address.   Nada confirms she received fax with pt needed paperwork.    Lum Croft, MSW, [place] 03/27/2023 3:41 PM

## 2023-03-28 DIAGNOSIS — F250 Schizoaffective disorder, bipolar type: Secondary | ICD-10-CM | POA: Diagnosis not present

## 2023-03-28 NOTE — Plan of Care (Signed)
 D: Pt alert and oriented. Pt denies experiencing any anxiety/depression at this time. Pt denies experiencing any pain at this time. Pt denies experiencing any SI/HI, or AVH at this time.   A: Scheduled medications administered to pt, per MD orders. Support and encouragement provided. Frequent verbal contact made. Routine safety checks conducted q15 minutes.   R: No adverse drug reactions noted. Pt verbally contracts for safety at this time. Pt compliant with medications and treatment plan. Pt interacts well but minimally with others on the unit. Pt remains safe at this time. Plan of care ongoing.  Pt voices excitement to discharge today.   Problem: Nutrition: Goal: Adequate nutrition will be maintained Outcome: Progressing   Problem: Pain Managment: Goal: General experience of comfort will improve Outcome: Progressing

## 2023-03-28 NOTE — Discharge Summary (Signed)
 Physician Discharge Summary Note  Patient:  Betty Henderson is an 63 y.o., female MRN:  [id number] DOB:  09-May-1960 Patient phone:  [phone] (home)  Patient address:   [address] [place] [address],  Total Time spent with patient: 1 hour  Date of Admission:  08/22/2022 Date of Discharge: 03/28/2023  Reason for Admission:  Betty Henderson is a 63 year old African-American female who is involuntarily admitted to inpatient psychiatry for worsening psychosis.  She presented to [facility] approximately 5 days ago and then was transferred to [facility].  She has a long history of schizoaffective disorder, bipolar type.  She is homeless and presents to the emergency room numerous times.  She was psychotic and aggressive and agitated.  Obviously, she was not taking her medications.  She is a poor historian and difficult to understand.  She was able to tell me that she does have a sister and 2 sons that are truck drivers.  She does not have contact with any of them.  Her psychiatric provider is unknown.  She has 1 psychiatric admission back in 2020 at [place]  at [facility].  She has been on Invega  Sustenna 234 in the past.  She has been on Zyprexa , Haldol , Risperdal, and Abilify.  She was supposed to follow-up at [facility].   Principal Problem: Schizoaffective disorder, bipolar type [facility]) Discharge Diagnoses: Principal Problem:   Schizoaffective disorder, bipolar type (HCC)   Past Psychiatric History: Schizoaffective disorder  Past Medical History:  Past Medical History:  Diagnosis Date   Anemia    Arthritis    Chronic pain    Drug-seeking behavior    Malingering    Osteopetrosis    Psychosis (HCC)    Schizoaffective disorder, bipolar type (HCC)     Past Surgical History:  Procedure Laterality Date   MOUTH SURGERY     TUBAL LIGATION     Family History:  Family History  Family history unknown: Yes   Family Psychiatric  History: Unremarkable Social History:  Social History    Substance and Sexual Activity  Alcohol Use Not Currently   Comment: 1 cocktail 3 weeks ago     Social History   Substance and Sexual Activity  Drug Use Not Currently   Types: Cocaine   Comment: states it's legal though    Social History   Socioeconomic History   Marital status: Single    Spouse name: Not on file   Number of children: Not on file   Years of education: Not on file   Highest education level: Not on file  Occupational History   Not on file  Tobacco Use   Smoking status: Every Day    Current packs/day: 0.50    Types: Cigarettes   Smokeless tobacco: Never  Vaping Use   Vaping status: Never Used  Substance and Sexual Activity   Alcohol use: Not Currently    Comment: 1 cocktail 3 weeks ago   Drug use: Not Currently    Types: Cocaine    Comment: states it's legal though   Sexual activity: Never  Other Topics Concern   Not on file  Social History Narrative   Not on file   Social Drivers of Health   Financial Resource Strain: Not on file  Food Insecurity: Food Insecurity Present (08/22/2022)   Hunger Vital Sign    Worried About Running Out of Food in the Last Year: Sometimes true    Ran Out of Food in the Last Year: Sometimes true  Transportation Needs: Patient  Unable To Answer (08/22/2022)   PRAPARE - Transportation    Lack of Transportation (Medical): Patient unable to answer    Lack of Transportation (Non-Medical): Patient unable to answer  Recent Concern: Transportation Needs - Unmet Transportation Needs (06/13/2022)   Received from [facility], [facility]   [facility] - Transportation    Lack of Transportation (Medical): Not on file    Lack of Transportation (Non-Medical): Yes  Physical Activity: Not on file  Stress: Not on file  Social Connections: Unknown (08/06/2021)   Received from [facility], [facility]   Social Network    Social Network: Not on file    Hospital Course: Betty Henderson is a 64 year old African-American female who was  voluntarily admitted to inpatient psychiatry with a history of polysubstance abuse and schizoaffective disorder.  [place] Department of social services was involved in her case throughout her hospitalization.  She was placed back on Invega  Sustenna did really well with it.  No side effects.  It was difficult for her to find a group home because the state really did not want her to live on her own and felt that she would not do well and would end up back in the hospital.  It was felt when she found a group home that would accept her that she maximized hospitalization and she was discharged.  On the day of discharge she denied suicidal ideation, homicidal ideation, auditory or visual hallucinations.  Her judgment and insight were good.  She remained very pleasant and cooperative throughout her hospitalization and was in good controls throughout her hospitalization.  Physical Findings: AIMS:  , ,  ,  ,    CIWA:    COWS:     Musculoskeletal: Strength & Muscle Tone: within normal limits Gait & Station: normal Patient leans: N/A   Psychiatric Specialty Exam:  Presentation  General Appearance:  Casual; Neat  Eye Contact: Fair  Speech: Slow; Slurred  Speech Volume: Decreased  Handedness: Right   Mood and Affect  Mood: Anxious  Affect: Inappropriate   Thought Process  Thought Processes: Disorganized  Descriptions of Associations:Loose  Orientation:Partial  Thought Content:Illogical; Rumination  History of Schizophrenia/Schizoaffective disorder:No data recorded Duration of Psychotic Symptoms:No data recorded Hallucinations:No data recorded Ideas of Reference:Paranoia  Suicidal Thoughts:No data recorded Homicidal Thoughts:No data recorded  Sensorium  Memory: Immediate Fair; Remote Poor  Judgment: Poor  Insight: Poor   Executive Functions  Concentration: Poor  Attention Span: Fair  Recall: [employer] of  Knowledge: Fair  Language: Fair   Psychomotor Activity  Psychomotor Activity:No data recorded  Assets  Assets: Communication Skills   Sleep  Sleep:No data recorded   Physical Exam: Physical Exam ROS Blood pressure 121/72, pulse 86, temperature 98 F (36.7 C), resp. rate 18, height 5' 6 (1.676 m), weight 86 kg, SpO2 100%. Body mass index is 30.59 kg/m.   Social History   Tobacco Use  Smoking Status Every Day   Current packs/day: 0.50   Types: Cigarettes  Smokeless Tobacco Never   Tobacco Cessation:  A prescription for an FDA-approved tobacco cessation medication was offered at discharge and the patient refused   Blood Alcohol level:  Lab Results  Component Value Date   [facility] <10 08/17/2022   ETH <10 01/11/2021    Metabolic Disorder Labs:  Lab Results  Component Value Date   HGBA1C 11.0 (H) 01/27/2023   MPG 269 01/27/2023   No results found for: PROLACTIN Lab Results  Component Value Date   CHOL 259 (H) 01/27/2023  TRIG 155 (H) 01/27/2023   HDL 65 01/27/2023   CHOLHDL 4.0 01/27/2023   VLDL 31 01/27/2023   LDLCALC 163 (H) 01/27/2023    See Psychiatric Specialty Exam and Suicide Risk Assessment completed by Attending Physician prior to discharge.  Discharge destination:  Home  Is patient on multiple antipsychotic therapies at discharge:  No   Has Patient had three or more failed trials of antipsychotic monotherapy by history:  No  Recommended Plan for Multiple Antipsychotic Therapies: NA   Allergies as of 03/28/2023       Reactions   Other Itching   PT reports having environmental allergies which she used to receive shots for, but cannot specify exact allergens   Lemon Oil Rash   Proanthocyanidin Rash   Strawberry Extract Rash   Adhesive  [tape] Rash   Cherry Rash   Wound Dressing Adhesive Rash        Medication List     STOP taking these medications    ondansetron  4 MG tablet Commonly known as: Zofran        TAKE these  medications      Indication  aspirin  EC 81 MG tablet Take 1 tablet (81 mg total) by mouth daily. Swallow whole.  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  Indication: High Amount of Fats in the Blood   glimepiride  4 MG tablet Commonly known as: AMARYL  Take 2 tablets (8 mg total) by mouth daily with breakfast.  Indication: Type 2 Diabetes   metFORMIN  1000 MG tablet Commonly known as: GLUCOPHAGE  Take 1 tablet (1,000 mg total) by mouth 2 (two) times daily at 8 am and 4 pm.  Indication: Type 2 Diabetes   oxybutynin  5 MG tablet Commonly known as: DITROPAN  Take 1 tablet (5 mg total) by mouth 2 (two) times daily.  Indication: Overactive Bladder   paliperidone  156 MG/ML Susy injection Commonly known as: INVEGA  SUSTENNA Inject 1 mL (156 mg total) into the muscle every 28 (twenty-eight) days. Start taking on: April 09, 2023  Indication: Schizoaffective Disorder   pantoprazole  40 MG tablet Commonly known as: PROTONIX  Take 2 tablets (80 mg total) by mouth daily.  Indication: Gastroesophageal Reflux Disease   topiramate  50 MG tablet Commonly known as: TOPAMAX  Take 1 tablet (50 mg total) by mouth 2 (two) times daily at 8 am and 4 pm.  Indication: Body Weight Gain due to Antipsychotic Medication Use   Tradjenta  5 MG Tabs tablet Generic drug: linagliptin  Take 1 tablet (5 mg total) by mouth daily.  Indication: Type 2 Diabetes   traZODone  50 MG tablet Commonly known as: DESYREL  Take 1 tablet (50 mg total) by mouth at bedtime.  Indication: Trouble Sleeping   zonisamide  100 MG capsule Commonly known as: ZONEGRAN  Take 1 capsule (100 mg total) by mouth at bedtime.  Indication: Binge Eating Disorder               Durable Medical Equipment  (From admission, onward)           Start     Ordered   03/28/23 0958  For home use only DME Walker  Once        Question:  Patient needs a walker to treat with the following condition  Answer:  Ambulatory dysfunction   03/27/23 0958            Follow-up Information     [facility]. Go on 03/28/2023.   Contact information: [address] [place], [place]  [address] [phone]                Follow-up recommendations:  [facility] in [place]    Signed: Charlie Dallas Salines, DO 03/28/2023, 11:08 AM

## 2023-03-28 NOTE — [facility] Suicide Risk Assessment (Signed)
 [facility] Discharge Suicide Risk Assessment   Principal Problem: Schizoaffective disorder, bipolar type (HCC) Discharge Diagnoses: Principal Problem:   Schizoaffective disorder, bipolar type (HCC)   Total Time spent with patient: 1 hour  Musculoskeletal: Strength & Muscle Tone: within normal limits Gait & Station: normal Patient leans: N/A  Psychiatric Specialty Exam  Presentation  General Appearance:  Casual; Neat  Eye Contact: Fair  Speech: Slow; Slurred  Speech Volume: Decreased  Handedness: Right   Mood and Affect  Mood: Anxious  Duration of Depression Symptoms: No data recorded Affect: Inappropriate   Thought Process  Thought Processes: Disorganized  Descriptions of Associations:Loose  Orientation:Partial  Thought Content:Illogical; Rumination  History of Schizophrenia/Schizoaffective disorder:No data recorded Duration of Psychotic Symptoms:No data recorded Hallucinations:No data recorded Ideas of Reference:Paranoia  Suicidal Thoughts:No data recorded Homicidal Thoughts:No data recorded  Sensorium  Memory: Immediate Fair; Remote Poor  Judgment: Poor  Insight: Poor   Executive Functions  Concentration: Poor  Attention Span: Fair  Recall: [employer] of Knowledge: Fair  Language: Fair   Psychomotor Activity  Psychomotor Activity:No data recorded  Assets  Assets: Communication Skills   Sleep  Sleep:No data recorded  Physical Exam: Physical Exam ROS Blood pressure 121/72, pulse 86, temperature 98 F (36.7 C), resp. rate 18, height 5' 6 (1.676 m), weight 86 kg, SpO2 100%. Body mass index is 30.59 kg/m.  Mental Status Per Nursing Assessment::   On Admission:  NA  Demographic Factors:  NA  Loss Factors: NA  Historical Factors: NA  Risk Reduction Factors:   NA  Cognitive Features That Contribute To Risk:  None    Suicide Risk:  Minimal: No identifiable suicidal ideation.  Patients presenting with no  risk factors but with morbid ruminations; may be classified as minimal risk based on the severity of the depressive symptoms   Follow-up Information     [facility]. Go on 03/28/2023.   Contact information: [address] [place], [place]  [address] [phone]                Plan Of Care/Follow-up recommendations: [facility]   Charlie Dallas Salines, DO 03/28/2023, 11:04 AM

## 2023-03-28 NOTE — Group Note (Signed)
 Date:  03/28/2023 Time:  1:05 AM  Group Topic/Focus:  Coping With Mental Health Crisis:   The purpose of this group is to help patients identify strategies for coping with mental health crisis.  Group discusses possible causes of crisis and ways to manage them effectively.    Participation Level:  Active  Participation Quality:  Appropriate  Affect:  Appropriate  Cognitive:  Appropriate  Insight: Appropriate  Engagement in Group:  Engaged  Modes of Intervention:  Education  Additional Comments:    Betty Henderson 03/28/2023, 1:05 AM

## 2023-03-28 NOTE — Progress Notes (Signed)
 Recreation Therapy Notes  LRT offered to provide nail care to pt.    Pt respectfully declined.    Mayci Haning LRT, CTRS Rosina Lowenstein 03/28/2023 7:46 AM

## 2023-03-28 NOTE — Progress Notes (Signed)
   03/27/23 2100  Psych Admission Type (Psych Patients Only)  Admission Status Voluntary  Psychosocial Assessment  Patient Complaints None  Eye Contact Fair  Facial Expression Flat  Affect Flat  Speech Logical/coherent  Interaction Minimal  Motor Activity Slow  Appearance/Hygiene Disheveled;Layered clothes  Behavior Characteristics Cooperative;Appropriate to situation;Calm  Mood Pleasant  Thought Process  Coherency WDL  Content WDL  Delusions None reported or observed  Perception WDL  Hallucination None reported or observed  Judgment Impaired  Confusion None  Danger to Self  Current suicidal ideation? Denies  Agreement Not to Harm Self Yes  Description of Agreement verbal  Danger to Others  Danger to Others None reported or observed  Danger to Others Abnormal  Harmful Behavior to others No threats or harm toward other people  Destructive Behavior No threats or harm toward property

## 2023-03-28 NOTE — Progress Notes (Signed)
   03/28/23 1400  Spiritual Encounters  Type of Visit Follow up  Care provided to: Patient  Conversation partners present during encounter Social worker/Care management/TOC  Referral source Social worker/Care management/TOC  Reason for visit Routine spiritual support  OnCall Visit Yes  Spiritual Framework  Presenting Themes Goals in life/care;Courage hope and growth;Impactful experiences and emotions   Chaplain met with patient to tell her goodbye. Social worker let me know that the patient is leaving today and going to a group home. Patient shared with the chaplain that she is excited to be going to her new home. Chaplain offered her words of hope and courage.

## 2023-03-28 NOTE — Progress Notes (Signed)
  [facility] Adult Case Management Discharge Plan :  Will you be returning to the same living situation after discharge:  No. Pt will be going to a family care home  At discharge, do you have transportation home?: Yes,  CSW will assist with transportation  Do you have the ability to pay for your medications: Yes,  pt is pending medicaid, hospital willing to cover via LOG   Release of information consent forms completed and in the chart;  Patient's signature needed at discharge.  Patient to Follow up at:  Follow-up Information     [facility]. Go on 03/28/2023.   Contact information: [address] [place], [place]  [address] [phone]                Next level of care provider has access to [facility] Link:no  Safety Planning and Suicide Prevention discussed: [place]  Luddie Boghosian, son, [phone]      Has patient been referred to the Quitline?: Patient refused referral for treatment  Patient has been referred for addiction treatment: Patient refused referral for treatment.  [address] LCSWA 03/28/2023, 9:15 AM

## 2023-03-28 NOTE — Progress Notes (Signed)
 D: Pt alert and oriented. Pt denies experiencing any pain, SI/HI, or AVH at this time. Pt reports she will be able to keep herself safe when she returns home. Pt has completed a suicide safety plan and was given a survey to fill out. AVS, SRA, and Transition Record reviewed and given to pt upon discharge.  A: Pt received discharge and medication education/information. Pt belongings were returned and signed for at this time to include medication supply, printed prescription, and a walker.   R: Pt verbalized understanding of discharge and medication education/information.  Pt escorted via wheelchair by staff to medical mal front lobby where pt was picked up by safe transport and transported to group home.

## 2023-09-24 ENCOUNTER — Encounter: Payer: Self-pay | Admitting: Podiatry

## 2023-09-24 ENCOUNTER — Ambulatory Visit (INDEPENDENT_AMBULATORY_CARE_PROVIDER_SITE_OTHER): Payer: MEDICAID | Admitting: Podiatry

## 2023-09-24 DIAGNOSIS — E1151 Type 2 diabetes mellitus with diabetic peripheral angiopathy without gangrene: Secondary | ICD-10-CM | POA: Diagnosis not present

## 2023-09-24 DIAGNOSIS — B351 Tinea unguium: Secondary | ICD-10-CM | POA: Diagnosis not present

## 2023-09-24 DIAGNOSIS — M79672 Pain in left foot: Secondary | ICD-10-CM

## 2023-09-24 DIAGNOSIS — M79671 Pain in right foot: Secondary | ICD-10-CM

## 2023-09-24 DIAGNOSIS — I70209 Unspecified atherosclerosis of native arteries of extremities, unspecified extremity: Secondary | ICD-10-CM

## 2023-09-24 NOTE — Progress Notes (Signed)
 Patient presents for evaluation and treatment of tenderness and some redness around nails feet.  Tenderness around toes with walking and wearing shoes.  Physical exam:  General appearance: Alert, pleasant, and in no acute distress.  Vascular: Pedal pulses: DP 2/4 B/L, PT 0/4 B/L. moderate edema lower legs bilaterally  Neurological:    Dermatologic:  Nails thickened, disfigured, discolored 1-5 BL with subungual debris.  Redness and hypertrophic nail folds along nail folds bilaterally but no signs of drainage or infection.  Musculoskeletal:  Hammertoes 2 through 5 bilaterally   Diagnosis: 1. Painful onychomycotic nails 1 through 5 bilaterally. 2. Pain toes 1 through 5 bilaterally. 3.  Diabetes mellitus type 2 with PVD  Plan: Debrided onychomycotic nails 1 through 5 bilaterally.  Return 3 months Betty Henderson

## 2023-12-25 ENCOUNTER — Ambulatory Visit: Payer: MEDICAID | Admitting: Podiatry

## 2024-01-12 ENCOUNTER — Ambulatory Visit (INDEPENDENT_AMBULATORY_CARE_PROVIDER_SITE_OTHER): Payer: MEDICAID | Admitting: Podiatry

## 2024-01-12 DIAGNOSIS — M79671 Pain in right foot: Secondary | ICD-10-CM

## 2024-01-12 DIAGNOSIS — M79672 Pain in left foot: Secondary | ICD-10-CM | POA: Diagnosis not present

## 2024-01-12 DIAGNOSIS — B351 Tinea unguium: Secondary | ICD-10-CM

## 2024-01-12 NOTE — Progress Notes (Signed)
 Patient presents for evaluation and treatment of tenderness and some redness around nails feet.  Tenderness around toes with walking and wearing shoes.  Physical exam:  General appearance: Alert, pleasant, and in no acute distress.  Vascular: Pedal pulses: DP 2/4 B/L, PT 0/4 B/L. Moderate edema lower legs bilaterally  Neu  Dermatologic:  Nails thickened, disfigured, discolored 1-5 BL with subungual debris.  Redness and hypertrophic nail folds along nail folds bilaterally but no signs of drainage or infection.  Musculoskeletal:     Diagnosis: 1. Painful onychomycotic nails 1 through 5 bilaterally. 2. Pain toes 1 through 5 bilaterally.  Plan: -Debrided onychomycotic nails 1 through 5 bilaterally.  Sharply debrided nails with nail clipper and reduced with a power bur.  Return 3 months [facility]

## 2024-02-12 ENCOUNTER — Other Ambulatory Visit: Payer: Self-pay | Admitting: Family Medicine

## 2024-02-12 DIAGNOSIS — Z1231 Encounter for screening mammogram for malignant neoplasm of breast: Secondary | ICD-10-CM

## 2024-03-10 ENCOUNTER — Other Ambulatory Visit (HOSPITAL_BASED_OUTPATIENT_CLINIC_OR_DEPARTMENT_OTHER): Payer: Self-pay | Admitting: Family Medicine

## 2024-03-10 DIAGNOSIS — Z13820 Encounter for screening for osteoporosis: Secondary | ICD-10-CM

## 2024-03-10 DIAGNOSIS — M810 Age-related osteoporosis without current pathological fracture: Secondary | ICD-10-CM

## 2024-03-23 ENCOUNTER — Ambulatory Visit
Admission: RE | Admit: 2024-03-23 | Discharge: 2024-03-23 | Disposition: A | Discharge status: Home / Self Care | Source: Ambulatory Visit | Attending: Family Medicine | Admitting: Family Medicine

## 2024-03-23 DIAGNOSIS — Z1231 Encounter for screening mammogram for malignant neoplasm of breast: Secondary | ICD-10-CM

## 2024-04-13 ENCOUNTER — Ambulatory Visit: Payer: MEDICAID | Admitting: Podiatry
